# Patient Record
Sex: Female | Born: 1945 | Race: Black or African American | Hispanic: No | Marital: Single | Smoking: Never smoker
Health system: Southern US, Community
[De-identification: ages and names within clinical notes are randomized; demographics above are authoritative.]

## PROBLEM LIST (undated history)

## (undated) DIAGNOSIS — H409 Unspecified glaucoma: Secondary | ICD-10-CM

## (undated) DIAGNOSIS — H35039 Hypertensive retinopathy, unspecified eye: Secondary | ICD-10-CM

## (undated) DIAGNOSIS — H269 Unspecified cataract: Secondary | ICD-10-CM

## (undated) DIAGNOSIS — Z9289 Personal history of other medical treatment: Secondary | ICD-10-CM

## (undated) DIAGNOSIS — M1990 Unspecified osteoarthritis, unspecified site: Secondary | ICD-10-CM

## (undated) DIAGNOSIS — I10 Essential (primary) hypertension: Secondary | ICD-10-CM

## (undated) DIAGNOSIS — E785 Hyperlipidemia, unspecified: Secondary | ICD-10-CM

## (undated) DIAGNOSIS — E11319 Type 2 diabetes mellitus with unspecified diabetic retinopathy without macular edema: Secondary | ICD-10-CM

## (undated) HISTORY — DX: Unspecified glaucoma: H40.9

## (undated) HISTORY — PX: EYE SURGERY: SHX253

## (undated) HISTORY — PX: CARPAL TUNNEL RELEASE: SHX101

## (undated) HISTORY — DX: Unspecified cataract: H26.9

## (undated) HISTORY — DX: Type 2 diabetes mellitus with unspecified diabetic retinopathy without macular edema: E11.319

## (undated) HISTORY — PX: TUBAL LIGATION: SHX77

## (undated) HISTORY — PX: KNEE ARTHROSCOPY: SUR90

## (undated) HISTORY — DX: Hypertensive retinopathy, unspecified eye: H35.039

---

## 1997-07-21 ENCOUNTER — Ambulatory Visit (HOSPITAL_COMMUNITY): Admission: RE | Admit: 1997-07-21 | Discharge: 1997-07-21 | Payer: Self-pay | Admitting: Endocrinology

## 1997-12-12 ENCOUNTER — Encounter: Admission: RE | Admit: 1997-12-12 | Discharge: 1998-03-12 | Payer: Self-pay | Admitting: Endocrinology

## 2000-09-16 ENCOUNTER — Other Ambulatory Visit: Admission: RE | Admit: 2000-09-16 | Discharge: 2000-09-16 | Payer: Self-pay | Admitting: *Deleted

## 2001-01-26 ENCOUNTER — Encounter: Admission: RE | Admit: 2001-01-26 | Discharge: 2001-04-26 | Payer: Self-pay | Admitting: Internal Medicine

## 2001-01-29 ENCOUNTER — Encounter: Admission: RE | Admit: 2001-01-29 | Discharge: 2001-01-29 | Payer: Self-pay | Admitting: Internal Medicine

## 2001-01-29 ENCOUNTER — Encounter: Payer: Self-pay | Admitting: Internal Medicine

## 2002-02-05 ENCOUNTER — Encounter: Payer: Self-pay | Admitting: Internal Medicine

## 2002-02-05 ENCOUNTER — Encounter: Admission: RE | Admit: 2002-02-05 | Discharge: 2002-02-05 | Payer: Self-pay | Admitting: Internal Medicine

## 2002-02-10 ENCOUNTER — Encounter: Payer: Self-pay | Admitting: Internal Medicine

## 2002-02-10 ENCOUNTER — Encounter: Admission: RE | Admit: 2002-02-10 | Discharge: 2002-02-10 | Payer: Self-pay | Admitting: Internal Medicine

## 2002-04-01 ENCOUNTER — Encounter: Admission: RE | Admit: 2002-04-01 | Discharge: 2002-04-01 | Payer: Self-pay | Admitting: Rheumatology

## 2002-04-01 ENCOUNTER — Encounter: Payer: Self-pay | Admitting: Rheumatology

## 2004-07-22 ENCOUNTER — Emergency Department (HOSPITAL_COMMUNITY): Admission: EM | Admit: 2004-07-22 | Discharge: 2004-07-22 | Payer: Self-pay | Admitting: Emergency Medicine

## 2005-01-15 ENCOUNTER — Ambulatory Visit (HOSPITAL_BASED_OUTPATIENT_CLINIC_OR_DEPARTMENT_OTHER): Admission: RE | Admit: 2005-01-15 | Discharge: 2005-01-15 | Payer: Self-pay | Admitting: Orthopedic Surgery

## 2005-02-05 ENCOUNTER — Ambulatory Visit (HOSPITAL_BASED_OUTPATIENT_CLINIC_OR_DEPARTMENT_OTHER): Admission: RE | Admit: 2005-02-05 | Discharge: 2005-02-05 | Payer: Self-pay | Admitting: Orthopedic Surgery

## 2006-05-31 ENCOUNTER — Emergency Department (HOSPITAL_COMMUNITY): Admission: EM | Admit: 2006-05-31 | Discharge: 2006-05-31 | Payer: Self-pay | Admitting: Family Medicine

## 2006-06-03 ENCOUNTER — Emergency Department (HOSPITAL_COMMUNITY): Admission: EM | Admit: 2006-06-03 | Discharge: 2006-06-03 | Payer: Self-pay | Admitting: Family Medicine

## 2007-07-06 ENCOUNTER — Encounter: Admission: RE | Admit: 2007-07-06 | Discharge: 2007-07-06 | Payer: Self-pay | Admitting: Internal Medicine

## 2007-07-29 ENCOUNTER — Encounter (INDEPENDENT_AMBULATORY_CARE_PROVIDER_SITE_OTHER): Payer: Self-pay | Admitting: Interventional Radiology

## 2007-07-29 ENCOUNTER — Other Ambulatory Visit: Admission: RE | Admit: 2007-07-29 | Discharge: 2007-07-29 | Payer: Self-pay | Admitting: Interventional Radiology

## 2007-07-29 ENCOUNTER — Encounter: Admission: RE | Admit: 2007-07-29 | Discharge: 2007-07-29 | Payer: Self-pay | Admitting: Internal Medicine

## 2008-08-29 ENCOUNTER — Ambulatory Visit (HOSPITAL_BASED_OUTPATIENT_CLINIC_OR_DEPARTMENT_OTHER): Admission: RE | Admit: 2008-08-29 | Discharge: 2008-08-29 | Payer: Self-pay | Admitting: Specialist

## 2010-03-20 ENCOUNTER — Encounter: Payer: PRIVATE HEALTH INSURANCE | Attending: Physical Medicine & Rehabilitation

## 2010-03-20 ENCOUNTER — Ambulatory Visit: Payer: PRIVATE HEALTH INSURANCE | Admitting: Physical Medicine & Rehabilitation

## 2010-03-20 DIAGNOSIS — R209 Unspecified disturbances of skin sensation: Secondary | ICD-10-CM | POA: Insufficient documentation

## 2010-03-20 DIAGNOSIS — M79609 Pain in unspecified limb: Secondary | ICD-10-CM | POA: Insufficient documentation

## 2010-03-22 DIAGNOSIS — M79609 Pain in unspecified limb: Secondary | ICD-10-CM

## 2010-03-22 DIAGNOSIS — M25549 Pain in joints of unspecified hand: Secondary | ICD-10-CM

## 2010-03-22 DIAGNOSIS — R209 Unspecified disturbances of skin sensation: Secondary | ICD-10-CM

## 2010-04-21 LAB — POCT I-STAT 4, (NA,K, GLUC, HGB,HCT)
Glucose, Bld: 127 mg/dL — ABNORMAL HIGH (ref 70–99)
HCT: 41 % (ref 36.0–46.0)
Hemoglobin: 13.9 g/dL (ref 12.0–15.0)
Potassium: 4.3 mEq/L (ref 3.5–5.1)
Sodium: 139 mEq/L (ref 135–145)

## 2010-04-21 LAB — GLUCOSE, CAPILLARY: Glucose-Capillary: 99 mg/dL (ref 70–99)

## 2010-04-24 ENCOUNTER — Other Ambulatory Visit: Payer: Self-pay | Admitting: Physical Medicine & Rehabilitation

## 2010-04-24 ENCOUNTER — Ambulatory Visit (HOSPITAL_COMMUNITY)
Admission: RE | Admit: 2010-04-24 | Discharge: 2010-04-24 | Disposition: A | Discharge status: Home / Self Care | Payer: PRIVATE HEALTH INSURANCE | Source: Ambulatory Visit | Attending: Physical Medicine & Rehabilitation | Admitting: Physical Medicine & Rehabilitation

## 2010-04-24 DIAGNOSIS — M25549 Pain in joints of unspecified hand: Secondary | ICD-10-CM | POA: Insufficient documentation

## 2010-04-24 DIAGNOSIS — M19049 Primary osteoarthritis, unspecified hand: Secondary | ICD-10-CM | POA: Insufficient documentation

## 2010-04-24 DIAGNOSIS — R52 Pain, unspecified: Secondary | ICD-10-CM

## 2010-04-30 ENCOUNTER — Ambulatory Visit: Payer: PRIVATE HEALTH INSURANCE | Admitting: Physical Medicine & Rehabilitation

## 2010-04-30 ENCOUNTER — Encounter: Payer: PRIVATE HEALTH INSURANCE | Attending: Physical Medicine & Rehabilitation

## 2010-04-30 DIAGNOSIS — M79609 Pain in unspecified limb: Secondary | ICD-10-CM | POA: Insufficient documentation

## 2010-04-30 DIAGNOSIS — R209 Unspecified disturbances of skin sensation: Secondary | ICD-10-CM | POA: Insufficient documentation

## 2010-04-30 DIAGNOSIS — G5610 Other lesions of median nerve, unspecified upper limb: Secondary | ICD-10-CM

## 2010-05-28 ENCOUNTER — Ambulatory Visit: Payer: PRIVATE HEALTH INSURANCE | Admitting: Physical Medicine & Rehabilitation

## 2010-05-29 NOTE — Op Note (Signed)
NAMEKATHRIN, Mary [name]              ACCOUNT NO.:  [id number]   MEDICAL RECORD NO.:  [id number]          PATIENT TYPE:  AMB   LOCATION:  [facility]                         FACILITY:  [facility]   PHYSICIAN:  Jene Every, M.D.    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   PROCEDURE PERFORMED:  1. Right knee arthroscopy.  2. Partial medial meniscectomy.  3. Chondroplasty of the medial femoral condyle, patella and shaving of      the lateral meniscus.   ANESTHESIA:  General.   ASSISTANT:  Roma Schanz, P.A.   NOTE:  Technical difficulty elevated due to the patient morbid obesity,  requiring assistant as a leg holder.   TECHNIQUE:  With the patient in supine position after administration of  adequate general anesthesia and 2 grams of Kefzol, the right lower  extremity was prepped and draped in the usual sterile fashion.  A  lateral parapatellar portal and a superomedial parapatellar portal were  fashioned with a #11 blade, after localization with an 18 gauge needle,  sparing the medial meniscus.  Under direct visualization, the medial  parapatellar portal was fashioned with a #11 blade after localization  with an 18 gauge needle sparing the medial meniscus.  I had difficulty  due to the patient's morbid obesity and positioning and digital  palpation.  Noted was extensive grade III changes in the medial  compartment, a complex tear of the entire posterior and middle portion  of the medial meniscus tear.  The basket rongeur was introduced and  utilized to perform a partial medial meniscectomy to a stable base and  further contoured with a  35 Kuda shaver.  This was to a stable base and  a light chondroplasty was performed of the femoral condyle.   The ACL and PCL were unremarkable.  The lateral compartment was  difficult to enter however, it showed some grade III changes throughout,  some minor grade IV changes near the tibial spine.  Degenerative changes  of the lateral meniscus.  This was shaved and a chondroplasty was  performed of the tibial plateau.  Also, the femoral condyle.   Also, difficulty obtaining the patellofemoral joint.  Some grade III  changes.  Normal patellofemoral tracking.  Light chondroplasty performed  here.  The gutters were unremarkable.   I revisited the medial compartment.  No further pathology amenable to  surgical intervention.  The knee was copiously lavaged and all  instrumentation was removed.  The portals were closed with 4-0 nylon  simple sutures.  Marcaine 0.25% with epinephrine was infiltrated in the joint.  The wound  was dressed sterilely.  She was awoken without difficulty and  transported to the recovery room in satisfactory condition.   The patient tolerated the procedure and there were no complications.  Jene Every, M.D.  Electronically Signed     JB/MEDQ  D:  08/29/2008  T:  08/29/2008  Job:  [id number]

## 2010-06-01 ENCOUNTER — Encounter: Payer: PRIVATE HEALTH INSURANCE | Attending: Physical Medicine & Rehabilitation

## 2010-06-01 ENCOUNTER — Ambulatory Visit: Payer: PRIVATE HEALTH INSURANCE | Admitting: Physical Medicine & Rehabilitation

## 2010-06-01 DIAGNOSIS — R209 Unspecified disturbances of skin sensation: Secondary | ICD-10-CM | POA: Insufficient documentation

## 2010-06-01 DIAGNOSIS — M79609 Pain in unspecified limb: Secondary | ICD-10-CM | POA: Insufficient documentation

## 2010-06-01 DIAGNOSIS — G5600 Carpal tunnel syndrome, unspecified upper limb: Secondary | ICD-10-CM

## 2010-06-01 NOTE — Procedures (Signed)
Mary [name], MASELLI              ACCOUNT NO.:  [id number]  MEDICAL RECORD NO.:  [id number]           PATIENT TYPE:  O  LOCATION:  [facility]                          FACILITY:  [facility]  PHYSICIAN:  Erick Colace, M.D.DATE OF BIRTH:  March 03, 1945  DATE OF PROCEDURE:  06/01/2010 DATE OF DISCHARGE:                              OPERATIVE REPORT  PROCEDURE:  This is a left carpal tunnel injection under fluoroscopic guidance.  INDICATION:  Median nerve pain status post carpal tunnel release.  Pain is only partially response to medication management including narcotic analgesics and tramadol.  The patient has ADL effects from her pain.  Informed consent was obtained after describing risks and benefits of the procedure with the patient.  These include bleeding, bruising, and infection.  She elects to proceed and has given written consent.  The patient pre-scanned with ultrasound.  Cross-sectional diameter 0.9 cm2. Then a 27-gauge 5-8 inch needle inserted, 0.25 mL of 1% lidocaine injected.  Then 22-gauge echo block needle inserted, visualized directly under ultrasound guidance.  Image is saved, 0.25 mL of 40 mg/mL Depo- Medrol were injected.  The patient tolerated procedure well. Postprocedure instructions given.     Erick Colace, M.D. Electronically Signed    AEK/MEDQ  D:  06/01/2010 10:30:40  T:  06/01/2010 22:15:06  Job:  [id number]

## 2010-06-01 NOTE — Op Note (Signed)
Mary [name], Mary [name]              ACCOUNT NO.:  [id number]   MEDICAL RECORD NO.:  [id number]          PATIENT TYPE:  AMB   LOCATION:  [facility]                          FACILITY:  [facility]   PHYSICIAN:  Katy Fitch. Sypher, M.D. DATE OF BIRTH:  1945/09/28   DATE OF PROCEDURE:  01/15/2005  DATE OF DISCHARGE:  07/22/2004                                 OPERATIVE REPORT   PREOPERATIVE DIAGNOSIS:  Severe right carpal tunnel syndrome, with  background insulin-dependent diabetes.   POSTOPERATIVE DIAGNOSIS:  Severe right carpal tunnel syndrome, with  background insulin-dependent diabetes.   OPERATION:  Release of right transcarpal ligament.   OPERATING SURGEON:  Katy Fitch. Sypher, M.D.   ASSISTANT:  Marveen Reeks. Dasnoit, P.A.-C.   ANESTHESIA:  General by LMA.   SUPERVISING ANESTHESIOLOGIST:  Judie Petit, M.D.   INDICATIONS:  Mary [name] is a 65 year old woman referred through the  courtesy of Dr. Dorothyann Peng of [facility] for  evaluation and management of hand pain and numbness.   Mary [name] has a history of insulin-dependent diabetes and hypertension.   Dr. Allyne Gee noted thenar atrophy and clinical signs of carpal tunnel  syndrome. She was referred for evaluation and management including with  electrodiagnostic studies by Dr. Johna Roles.  Electrodiagnostic studies  confirmed severe right carpal tunnel syndrome.   Due to her failure to respond to nonoperative measures, she is brought to  the operating room at this time for release of her right transcarpal  ligament.   PROCEDURE:  Mary [name] is brought to the operating room and placed in a  supine position on the operating table. Following induction of general  anesthesia by LMA technique, the right arm was prepped with Betadine soap  and solution and  sterilely draped. Following exsanguination of the limb  with an Esmarch bandage, an arterial tourniquet on the proximal brachium and  was inflated to 240  mmHg. The procedure commenced with a short incision in  the line of the ring finger in the palm. The subcutaneous tissues were  carefully divided revealing the palmar fascia. This was split longitudinally  to reveal the common extensor branches of the median nerve.   These were followed back to transcarpal ligament which was carefully  isolated from the median nerve. The ligament was then released along its  ulnar border extending into the distal forearm.   This widely opened the carpal canal. No masses or other predicaments were  noted.   Bleeding points along the margin of the released ligament were  electrocauterized with bipolar current followed by repair of the skin with  intradermal 3-0 Prolene suture.   A compressive dressing was applied with a volar plaster splint maintaining  the wrist in 5 degrees of dorsiflexion.   For aftercare Mary [name] is provided a prescription for Percocet 5 mg 1  p.o. q.4-6 h. p.r.n. pain. She will return to see [place] in a week for dressing  change and advancement to an exercise program. Sutures will be removed in 8-  10 days postoperatively or sooner p.r.n. problems.      Katy Fitch Sypher, M.D.  Electronically Signed     RVS/MEDQ  D:  01/15/2005  T:  01/15/2005  Job:  [id number]   cc:   Dr. Teressa Senter x2 copies

## 2010-06-01 NOTE — Op Note (Signed)
Mary [name], Mary [name]              ACCOUNT NO.:  [id number]   MEDICAL RECORD NO.:  [id number]          PATIENT TYPE:  AMB   LOCATION:  [facility]                          FACILITY:  [facility]   PHYSICIAN:  Katy Fitch. Sypher, M.D. DATE OF BIRTH:  Sep 06, 1945   DATE OF PROCEDURE:  02/05/2005  DATE OF DISCHARGE:                                 OPERATIVE REPORT   PREOP DIAGNOSIS:  Severe left carpal tunnel syndrome with incidental finding  of left volar ganglion.   POSTOP DIAGNOSIS:  Severe left carpal tunnel syndrome with incidental  finding of left volar ganglion.   OPERATION:  Release of left transverse carpal ligament.   OPERATING SURGEON:  Katy Fitch. Sypher, M.D.   ASSISTANT:  Molly Maduro Dasnoit PA-C.   ANESTHESIA:  General by LMA.   SUPERVISING ANESTHESIOLOGIST:  Dr. Jairo Ben.   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 Dorothyann Peng [facility] for  evaluation and management of hand numbness and pain.   Dr. Allyne Gee noted bilateral thenar atrophy and significant hand impairment  consistent with severe carpal tunnel syndrome.   Mary [name] was referred for evaluation and management at [facility]. She was seen for consultation and noted to have evidence  of bilateral carpal tunnel syndrome with severe thenar atrophy. She had  electrodiagnostic studies completed by Dr. Wadie Lessen which revealed  absence of her motor conduction velocities bilaterally consistent with  profound carpal tunnel syndrome.   Mary [name] is status post release of right transverse carpal ligament 3  weeks prior and now presents for release of her left transverse carpal  ligament.   Preoperatively, during her examination she was noted to have a small  incidental volar ganglion adjacent to the radial artery at the wrist. This  has no bearing on her carpal tunnel syndrome. This was otherwise  asymptomatic  therefore, it is not addressed at this time.   After informed consent, she is brought to the operating this time  anticipating decompression of her left median nerve at the carpal tunnel by  release the transverse carpal ligament. Questions were invited and answered  preoperatively.   PROCEDURE:  Mary [name] is brought to the operating room and placed  supine position on the operating table.   Following induction of general anesthesia by LMA technique, the left arm was  prepped with Betadine soap solution, sterilely draped. Following examination  limb with Esmarch bandage, an arterial tourniquet on the proximal brachium  inflated to 220 mmHg. Procedure commenced with short incision in line of the  ring finger in the palm. The subcutaneous tissues were carefully divided to  reveal the palmar fascia.  This was split longitudinally to reveal the  common sensory branch of the median nerve. Ligament released along its ulnar  border extending into the distal forearm. This widely opened carpal canal.  No mass or other predicaments are noted.   Bleeding points along the margin of the released ligament were  electrocauterized with bipolar forceps.   The wound was inspected. There was noted be complete denervation  of the  thenar muscles with a veal appearance of pale atrophic muscle. There were no  other masses noted in the carpal canal.   There was then repaired with intradermal 3-0 Prolene suture.   A compressive dressing applied with a volar plaster splint maintaining wrist  in 5 degrees of dorsiflexion.      Katy Fitch Sypher, M.D.  Electronically Signed     RVS/MEDQ  D:  02/05/2005  T:  02/05/2005  Job:  [id number]

## 2010-07-06 ENCOUNTER — Ambulatory Visit: Payer: PRIVATE HEALTH INSURANCE | Admitting: Physical Medicine & Rehabilitation

## 2010-07-20 ENCOUNTER — Encounter: Payer: PRIVATE HEALTH INSURANCE | Attending: Physical Medicine & Rehabilitation

## 2010-07-20 ENCOUNTER — Ambulatory Visit (HOSPITAL_BASED_OUTPATIENT_CLINIC_OR_DEPARTMENT_OTHER): Payer: PRIVATE HEALTH INSURANCE | Admitting: Physical Medicine & Rehabilitation

## 2010-07-20 DIAGNOSIS — M79609 Pain in unspecified limb: Secondary | ICD-10-CM | POA: Insufficient documentation

## 2010-07-20 DIAGNOSIS — G5600 Carpal tunnel syndrome, unspecified upper limb: Secondary | ICD-10-CM

## 2010-07-20 DIAGNOSIS — R209 Unspecified disturbances of skin sensation: Secondary | ICD-10-CM | POA: Insufficient documentation

## 2010-07-20 DIAGNOSIS — M7710 Lateral epicondylitis, unspecified elbow: Secondary | ICD-10-CM

## 2010-07-20 NOTE — Assessment & Plan Note (Signed)
REASON FOR VISIT:  Elbow and wrist and hand pain.  A 65 year old female sent by Dr. Allyne Gee with primary complaint of hand pain.  She does have mild osteoarthritis.  She has had carpal tunnel release in the past, but despite this has had persistent pain in the median nerve distribution also going up to the elbows.  She had a repeat EMG and CV April 30, 2010, showing absent left median response at the sensory nerve.  Right median sensory was prolonged.  There was also prolongation of bilateral median motor responses.  She underwent a carpal tunnel injection on the left side on May 29, 2010, which was not helpful for her.  She was scheduled for the right side, but would not like to pursue this due to poor efficacy.  Her pain medicines include hydrocodone 5/325 b.i.d.  She thinks this works better.  She takes 2 a time, but I reiterated that she is prescribed 1 tablet twice a day mainly during work hours.  Other meds include Micardis, metoprolol, Crestor and Levemir.  REVIEW OF SYSTEMS:  Positive for elbow pain bilaterally left greater than right.  Examination, she does have tenderness over the lateral epicondyle left side greater than right side.  No pain with resisted wrist extension. Negative Tinel, negative Phalen.  She has intact sensation.  She has good strength in upper extremities.  Neck no pain.  Gait is normal.  IMPRESSION: 1. Bilateral carpal tunnel syndrome, chronic postoperative pain.  She     has evidence of median neuropathy.  It is difficult to say how much     this is new versus old.  Certainly, she never had a very good     relief from her release and may have had symptoms that went on for     a long time prior to her release.  Nevertheless, she would not like     to follow up with her prior surgeon on this. 2. Elbow pain.  This could be radiating further from the carpal tunnel     or also can be due to lateral epicondylitis given that she has some     point  tenderness on the left side greater than the right side.  We     will inject today.  PLAN: 1. We will in-state some gabapentin 100 mg working up to q.i.d. 2. Continue hydrocodone 5/325 b.i.d. emphasized need to stay within     prescribed limits.  However, may need to up it to 7.5 on next     prescription. 3. Discussed with the patient and agrees with plan.  I will see her     back in 3-4 weeks.     Erick Colace, M.D. Electronically Signed    AEK/MedQ D:  07/20/2010 11:44:42  T:  07/20/2010 13:18:30  Job #:  [id number]  cc:   Candyce Churn. Allyne Gee, M.D. Fax: [phone]

## 2010-07-20 NOTE — Procedures (Signed)
Mary [name], Mary [name]              ACCOUNT NO.:  [id number]  MEDICAL RECORD NO.:  [id number]           PATIENT TYPE:  O  LOCATION:  [facility]                          FACILITY:  [facility]  PHYSICIAN:  Erick Colace, M.D.DATE OF BIRTH:  1945-02-20  DATE OF PROCEDURE:  07/20/2010 DATE OF DISCHARGE:                              OPERATIVE REPORT  PROCEDURE:  Left lateral epicondyle injection.  INDICATIONS:  Left lateral epicondylitis.  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  These include bleeding, bruising, and infection.  She elects to proceed and has given written consent.  The patient placed in a seated position area over the left lateral epicondyle just 2 cm distal to the bone marked, prepped with Betadine and alcohol, entered with 25-gauge inch half needle, solution containing 0.5 mL of 40 mg/mL Depo-Medrol, 1 mL of 1% lidocaine injected.  The patient tolerated the procedure well.  Postprocedure instructions given. I will see her back in 1 month, possible injection on the right side at that time.  If not particularly helpful and still symptomatic, consider ultrasound-guided.     Erick Colace, M.D. Electronically Signed    AEK/MEDQ  D:  07/20/2010 11:40:26  T:  07/20/2010 15:34:02  Job:  [id number]

## 2010-08-27 ENCOUNTER — Encounter: Payer: PRIVATE HEALTH INSURANCE | Attending: Physical Medicine & Rehabilitation

## 2010-08-27 ENCOUNTER — Ambulatory Visit: Payer: PRIVATE HEALTH INSURANCE | Admitting: Physical Medicine & Rehabilitation

## 2010-08-27 DIAGNOSIS — R209 Unspecified disturbances of skin sensation: Secondary | ICD-10-CM | POA: Insufficient documentation

## 2010-08-27 DIAGNOSIS — M79609 Pain in unspecified limb: Secondary | ICD-10-CM | POA: Insufficient documentation

## 2010-09-07 ENCOUNTER — Ambulatory Visit: Payer: PRIVATE HEALTH INSURANCE | Admitting: Physical Medicine & Rehabilitation

## 2010-09-07 DIAGNOSIS — M25539 Pain in unspecified wrist: Secondary | ICD-10-CM

## 2010-09-07 DIAGNOSIS — M25549 Pain in joints of unspecified hand: Secondary | ICD-10-CM

## 2010-09-08 NOTE — Assessment & Plan Note (Signed)
HISTORY OF PRESENT ILLNESS:  This is a patient seen by Dr. Wynn Banker for complaints of bilateral hand pain.  She has been treated by Dr. Allyne Gee for the same.  She has arthritis and history of carpal tunnel release in the past.  She was seen by Dr. Wynn Banker in July.  He at that time continued her on hydrocodone 5/325 b.i.d.  Today, she did not bring her bottles with her.  She states she just got her refill on Monday but she has taken at least three a day because of her pain.  She rates her pain at 8.  She does not indicate what her activity level is when the pain is worse, what helps her, or what makes it worse.  Her mobility is independent today.  She does use a cane.  She does not climb steps.  She drives.  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  REVIEW OF SYSTEMS:  Notable for those difficulties, otherwise within normal limits.  PAST MEDICAL HISTORY:  Unchanged.  SOCIAL HISTORY:  Not indicated.  FAMILY HISTORY:  Significant for diabetes and hypertension.  PHYSICAL EXAMINATION:  VITAL SIGNS:  Blood pressure 149/79, pulse 93, respirations 18, and O2 sats 99 on room air. NEUROLOGIC:  Her motor strength and sensation is intact in the upper and lower extremities.  Constitutionally, she is morbidly obese.  She is alert and oriented x3 and appears somewhat irritable.  She does walk with a limp.  ASSESSMENT: 1. Bilateral carpal tunnel syndrome, chronic postoperative pain. 2. Elbow pain.  PLAN:  She can continue her gabapentin 100 mg up to q.i.d.  I spoke with Dr. Wynn Banker because of the fact he counts of her last month's hydrocodone use and the fact she did not bring her bottles we will not prescribe any more hydrocodone.  She understands this.  We will see her back here in the clinic with Dr. Wynn Banker in 1 month.     Sherril Shipman L. Blima Dessert Electronically Signed    RLW/MedQ D:  09/07/2010 15:22:45  T:  09/08/2010 00:33:42  Job #:  [id number]

## 2010-10-05 ENCOUNTER — Ambulatory Visit: Payer: PRIVATE HEALTH INSURANCE | Admitting: Physical Medicine & Rehabilitation

## 2011-10-08 ENCOUNTER — Other Ambulatory Visit: Payer: Self-pay | Admitting: Orthopedic Surgery

## 2011-10-11 ENCOUNTER — Encounter (HOSPITAL_COMMUNITY)
Admission: RE | Admit: 2011-10-11 | Discharge: 2011-10-11 | Disposition: A | Discharge status: Home / Self Care | Payer: PRIVATE HEALTH INSURANCE | Source: Ambulatory Visit | Attending: Orthopedic Surgery | Admitting: Orthopedic Surgery

## 2011-10-11 ENCOUNTER — Encounter (HOSPITAL_COMMUNITY): Payer: Self-pay

## 2011-10-11 HISTORY — DX: Essential (primary) hypertension: I10

## 2011-10-11 HISTORY — DX: Hyperlipidemia, unspecified: E78.5

## 2011-10-11 HISTORY — DX: Unspecified osteoarthritis, unspecified site: M19.90

## 2011-10-11 LAB — COMPREHENSIVE METABOLIC PANEL
AST: 15 U/L (ref 0–37)
Albumin: 4 g/dL (ref 3.5–5.2)
Alkaline Phosphatase: 90 U/L (ref 39–117)
CO2: 26 mEq/L (ref 19–32)
Chloride: 102 mEq/L (ref 96–112)
Creatinine, Ser: 1.05 mg/dL (ref 0.50–1.10)
GFR calc non Af Amer: 55 mL/min — ABNORMAL LOW (ref >90)
Potassium: 4.3 mEq/L (ref 3.5–5.1)
Total Bilirubin: 0.4 mg/dL (ref 0.3–1.2)

## 2011-10-11 LAB — URINALYSIS, ROUTINE W REFLEX MICROSCOPIC
Glucose, UA: NEGATIVE mg/dL
Hgb urine dipstick: NEGATIVE
Ketones, ur: NEGATIVE mg/dL
Specific Gravity, Urine: 1.024 (ref 1.005–1.030)
pH: 6 (ref 5.0–8.0)

## 2011-10-11 LAB — CBC WITH DIFFERENTIAL/PLATELET
Basophils Absolute: 0.1 10*3/uL (ref 0.0–0.1)
HCT: 37 % (ref 36.0–46.0)
Hemoglobin: 12.9 g/dL (ref 12.0–15.0)
Lymphocytes Relative: 30 % (ref 12–46)
Monocytes Absolute: 0.6 10*3/uL (ref 0.1–1.0)
Monocytes Relative: 6 % (ref 3–12)
Neutro Abs: 6.6 10*3/uL (ref 1.7–7.7)
Neutrophils Relative %: 61 % (ref 43–77)
RDW: 12.8 % (ref 11.5–15.5)
WBC: 10.7 10*3/uL — ABNORMAL HIGH (ref 4.0–10.5)

## 2011-10-11 LAB — PROTIME-INR: INR: 0.97 (ref 0.00–1.49)

## 2011-10-11 LAB — TYPE AND SCREEN: ABO/RH(D): O POS

## 2011-10-11 LAB — SURGICAL PCR SCREEN: Staphylococcus aureus: NEGATIVE

## 2011-10-11 LAB — APTT: aPTT: 32 seconds (ref 24–37)

## 2011-10-11 MED ORDER — CHLORHEXIDINE GLUCONATE 4 % EX LIQD: 60.0000 mL | Freq: Once | CUTANEOUS | Status: DC

## 2011-10-11 NOTE — Pre-Procedure Instructions (Signed)
20 Mary [name]  10/11/2011   Your procedure is scheduled on:  October 21, 2011  Report to [facility] at 5:30 AM.  Call this number if you have problems the morning of surgery: [phone]   Remember:   Do not eat food:After Midnight.    Take these medicines the morning of surgery with A SIP OF WATER: micardis, nexium    STOP ASPIRIN   Do not wear jewelry, make-up or nail polish.  Do not wear lotions, powders, or perfumes.   Do not shave 48 hours prior to surgery.   Do not bring valuables to the hospital.  Contacts, dentures or bridgework may not be worn into surgery.  Leave suitcase in the car. After surgery it may be brought to your room.  For patients admitted to the hospital, checkout time is 11:00 AM the day of discharge.   Patients discharged the day of surgery will not be allowed to drive home.  Name and phone number of your driver:   Special Instructions: Incentive Spirometry - Practice and bring it with you on the day of surgery. Shower using CHG 2 nights before surgery and the night before surgery.  If you shower the day of surgery use CHG.  Use special wash - you have one bottle of CHG for all showers.  You should use approximately 1/3 of the bottle for each shower.   Please read over the following fact sheets that you were given: Pain Booklet, Coughing and Deep Breathing, Blood Transfusion Information, Total Joint Packet and Surgical Site Infection Prevention

## 2011-10-12 LAB — URINE CULTURE: Colony Count: 40000

## 2011-10-20 MED ORDER — DEXTROSE 5 % IV SOLN
3.0000 g | INTRAVENOUS | Status: DC
  Filled 2011-10-20: qty 3000

## 2011-10-21 ENCOUNTER — Encounter (HOSPITAL_COMMUNITY): Payer: Self-pay | Admitting: Certified Registered"

## 2011-10-21 ENCOUNTER — Encounter (HOSPITAL_COMMUNITY)
Admission: RE | Disposition: A | Discharge status: Home / Self Care | Payer: Self-pay | Source: Ambulatory Visit | Attending: Orthopedic Surgery

## 2011-10-21 ENCOUNTER — Inpatient Hospital Stay (HOSPITAL_COMMUNITY)
Admission: RE | Admit: 2011-10-21 | Discharge: 2011-10-25 | DRG: 470 | Disposition: A | Discharge status: Home / Self Care | Payer: PRIVATE HEALTH INSURANCE | Source: Ambulatory Visit | Attending: Orthopedic Surgery | Admitting: Orthopedic Surgery

## 2011-10-21 ENCOUNTER — Encounter (HOSPITAL_COMMUNITY): Payer: Self-pay | Admitting: Surgery

## 2011-10-21 ENCOUNTER — Inpatient Hospital Stay (HOSPITAL_COMMUNITY): Payer: PRIVATE HEALTH INSURANCE | Admitting: Certified Registered"

## 2011-10-21 ENCOUNTER — Encounter (HOSPITAL_COMMUNITY): Payer: Self-pay | Admitting: *Deleted

## 2011-10-21 DIAGNOSIS — E785 Hyperlipidemia, unspecified: Secondary | ICD-10-CM | POA: Diagnosis present

## 2011-10-21 DIAGNOSIS — Z794 Long term (current) use of insulin: Secondary | ICD-10-CM

## 2011-10-21 DIAGNOSIS — Z01810 Encounter for preprocedural cardiovascular examination: Secondary | ICD-10-CM

## 2011-10-21 DIAGNOSIS — M1710 Unilateral primary osteoarthritis, unspecified knee: Principal | ICD-10-CM | POA: Diagnosis present

## 2011-10-21 DIAGNOSIS — Z7982 Long term (current) use of aspirin: Secondary | ICD-10-CM

## 2011-10-21 DIAGNOSIS — E119 Type 2 diabetes mellitus without complications: Secondary | ICD-10-CM | POA: Diagnosis present

## 2011-10-21 DIAGNOSIS — I10 Essential (primary) hypertension: Secondary | ICD-10-CM | POA: Diagnosis present

## 2011-10-21 DIAGNOSIS — F17200 Nicotine dependence, unspecified, uncomplicated: Secondary | ICD-10-CM | POA: Diagnosis present

## 2011-10-21 DIAGNOSIS — Z01818 Encounter for other preprocedural examination: Secondary | ICD-10-CM

## 2011-10-21 DIAGNOSIS — Z01812 Encounter for preprocedural laboratory examination: Secondary | ICD-10-CM

## 2011-10-21 DIAGNOSIS — D62 Acute posthemorrhagic anemia: Secondary | ICD-10-CM | POA: Diagnosis not present

## 2011-10-21 DIAGNOSIS — IMO0002 Reserved for concepts with insufficient information to code with codable children: Principal | ICD-10-CM | POA: Diagnosis present

## 2011-10-21 DIAGNOSIS — M1711 Unilateral primary osteoarthritis, right knee: Secondary | ICD-10-CM

## 2011-10-21 DIAGNOSIS — Z79899 Other long term (current) drug therapy: Secondary | ICD-10-CM

## 2011-10-21 DIAGNOSIS — Z01811 Encounter for preprocedural respiratory examination: Secondary | ICD-10-CM

## 2011-10-21 HISTORY — PX: TOTAL KNEE ARTHROPLASTY: SHX125

## 2011-10-21 LAB — GLUCOSE, CAPILLARY
Glucose-Capillary: 92 mg/dL (ref 70–99)
Glucose-Capillary: 148 mg/dL — ABNORMAL HIGH (ref 70–99)

## 2011-10-21 LAB — HEMOGLOBIN A1C: Mean Plasma Glucose: 114 mg/dL (ref <117)

## 2011-10-21 SURGERY — ARTHROPLASTY, KNEE, TOTAL
Anesthesia: Regional | Site: Knee | Laterality: Right | Wound class: Clean

## 2011-10-21 MED ORDER — ONDANSETRON HCL 4 MG PO TABS: 4.0000 mg | ORAL_TABLET | Freq: Four times a day (QID) | ORAL | Status: DC | PRN

## 2011-10-21 MED ORDER — INFLUENZA VIRUS VACC SPLIT PF IM SUSP
0.5000 mL | INTRAMUSCULAR | Status: AC
  Administered 2011-10-22: 0.5 mL via INTRAMUSCULAR
  Filled 2011-10-21: qty 0.5

## 2011-10-21 MED ORDER — BUPIVACAINE-EPINEPHRINE 0.25% -1:200000 IJ SOLN
INTRAMUSCULAR | Status: DC | PRN
  Administered 2011-10-21: 20 mL

## 2011-10-21 MED ORDER — HYDROCHLOROTHIAZIDE 25 MG PO TABS
25.0000 mg | ORAL_TABLET | Freq: Every day | ORAL | Status: DC
  Administered 2011-10-22 – 2011-10-25 (×4): 25 mg via ORAL
  Filled 2011-10-21 (×4): qty 1

## 2011-10-21 MED ORDER — BUPIVACAINE-EPINEPHRINE PF 0.5-1:200000 % IJ SOLN
INTRAMUSCULAR | Status: DC | PRN
  Administered 2011-10-21: 30 mL

## 2011-10-21 MED ORDER — BUPIVACAINE 0.25 % ON-Q PUMP SINGLE CATH 300ML
300.0000 mL | INJECTION | Status: DC
  Filled 2011-10-21: qty 300

## 2011-10-21 MED ORDER — ALUM & MAG HYDROXIDE-SIMETH 200-200-20 MG/5ML PO SUSP: 30.0000 mL | ORAL | Status: DC | PRN

## 2011-10-21 MED ORDER — CELECOXIB 200 MG PO CAPS
200.0000 mg | ORAL_CAPSULE | Freq: Two times a day (BID) | ORAL | Status: DC
  Administered 2011-10-21 – 2011-10-25 (×9): 200 mg via ORAL
  Filled 2011-10-21 (×10): qty 1

## 2011-10-21 MED ORDER — TELMISARTAN-HCTZ 80-25 MG PO TABS: 1.0000 | ORAL_TABLET | Freq: Every day | ORAL | Status: DC

## 2011-10-21 MED ORDER — HYDROMORPHONE HCL PF 1 MG/ML IJ SOLN
0.2500 mg | INTRAMUSCULAR | Status: DC | PRN
  Administered 2011-10-21 (×6): 0.5 mg via INTRAVENOUS

## 2011-10-21 MED ORDER — SODIUM CHLORIDE 0.9 % IV SOLN: INTRAVENOUS | Status: DC

## 2011-10-21 MED ORDER — SODIUM CHLORIDE 0.9 % IR SOLN
Status: DC | PRN
  Administered 2011-10-21: 3000 mL

## 2011-10-21 MED ORDER — SENNOSIDES-DOCUSATE SODIUM 8.6-50 MG PO TABS: 1.0000 | ORAL_TABLET | Freq: Every evening | ORAL | Status: DC | PRN

## 2011-10-21 MED ORDER — BUPIVACAINE-EPINEPHRINE PF 0.25-1:200000 % IJ SOLN
INTRAMUSCULAR | Status: AC
  Filled 2011-10-21: qty 30

## 2011-10-21 MED ORDER — OXYCODONE HCL 5 MG/5ML PO SOLN: 5.0000 mg | Freq: Once | ORAL | Status: DC | PRN

## 2011-10-21 MED ORDER — LIRAGLUTIDE 18 MG/3ML ~~LOC~~ SOLN
1.8000 mg | Freq: Every day | SUBCUTANEOUS | Status: DC
  Administered 2011-10-21 – 2011-10-24 (×3): 1.8 mg via SUBCUTANEOUS

## 2011-10-21 MED ORDER — ONDANSETRON HCL 4 MG/2ML IJ SOLN: 4.0000 mg | Freq: Once | INTRAMUSCULAR | Status: DC | PRN

## 2011-10-21 MED ORDER — ONDANSETRON HCL 4 MG/2ML IJ SOLN
INTRAMUSCULAR | Status: DC | PRN
  Administered 2011-10-21: 4 mg via INTRAVENOUS

## 2011-10-21 MED ORDER — BUPIVACAINE 0.25 % ON-Q PUMP SINGLE CATH 300ML
INJECTION | Status: DC | PRN
  Administered 2011-10-21: 300 mL

## 2011-10-21 MED ORDER — INSULIN DETEMIR 100 UNIT/ML ~~LOC~~ SOLN
38.0000 [IU] | Freq: Every day | SUBCUTANEOUS | Status: DC
  Administered 2011-10-22 – 2011-10-24 (×3): 38 [IU] via SUBCUTANEOUS
  Filled 2011-10-21: qty 10

## 2011-10-21 MED ORDER — ROCURONIUM BROMIDE 100 MG/10ML IV SOLN
INTRAVENOUS | Status: DC | PRN
Start: 2011-10-21 — End: 2011-10-21
  Administered 2011-10-21: 50 mg via INTRAVENOUS

## 2011-10-21 MED ORDER — DIPHENHYDRAMINE HCL 12.5 MG/5ML PO ELIX: 12.5000 mg | ORAL_SOLUTION | ORAL | Status: DC | PRN

## 2011-10-21 MED ORDER — DOCUSATE SODIUM 100 MG PO CAPS
100.0000 mg | ORAL_CAPSULE | Freq: Two times a day (BID) | ORAL | Status: DC
  Administered 2011-10-21 – 2011-10-25 (×8): 100 mg via ORAL
  Filled 2011-10-21 (×10): qty 1

## 2011-10-21 MED ORDER — HYDROMORPHONE HCL PF 1 MG/ML IJ SOLN
1.0000 mg | INTRAMUSCULAR | Status: DC | PRN
  Administered 2011-10-21 – 2011-10-24 (×9): 1 mg via INTRAVENOUS
  Filled 2011-10-21 (×10): qty 1

## 2011-10-21 MED ORDER — OXYCODONE HCL 5 MG PO TABS
5.0000 mg | ORAL_TABLET | ORAL | Status: DC | PRN
  Administered 2011-10-21 – 2011-10-25 (×15): 10 mg via ORAL
  Filled 2011-10-21 (×17): qty 2

## 2011-10-21 MED ORDER — CEFAZOLIN SODIUM-DEXTROSE 2-3 GM-% IV SOLR
2.0000 g | Freq: Four times a day (QID) | INTRAVENOUS | Status: AC
  Administered 2011-10-21 (×2): 2 g via INTRAVENOUS
  Filled 2011-10-21 (×2): qty 50

## 2011-10-21 MED ORDER — FLEET ENEMA 7-19 GM/118ML RE ENEM: 1.0000 | ENEMA | Freq: Once | RECTAL | Status: AC | PRN

## 2011-10-21 MED ORDER — PHENOL 1.4 % MT LIQD: 1.0000 | OROMUCOSAL | Status: DC | PRN

## 2011-10-21 MED ORDER — ZOLPIDEM TARTRATE 5 MG PO TABS: 5.0000 mg | ORAL_TABLET | Freq: Every evening | ORAL | Status: DC | PRN

## 2011-10-21 MED ORDER — INSULIN ASPART 100 UNIT/ML ~~LOC~~ SOLN
0.0000 [IU] | Freq: Three times a day (TID) | SUBCUTANEOUS | Status: DC
  Administered 2011-10-22 – 2011-10-24 (×3): 2 [IU] via SUBCUTANEOUS

## 2011-10-21 MED ORDER — CEFAZOLIN SODIUM 1-5 GM-% IV SOLN
INTRAVENOUS | Status: DC | PRN
  Administered 2011-10-21: 3 g via INTRAVENOUS

## 2011-10-21 MED ORDER — METHOCARBAMOL 100 MG/ML IJ SOLN
500.0000 mg | Freq: Four times a day (QID) | INTRAVENOUS | Status: DC | PRN
  Filled 2011-10-21: qty 5

## 2011-10-21 MED ORDER — ACETAMINOPHEN 10 MG/ML IV SOLN: 1000.0000 mg | Freq: Four times a day (QID) | INTRAVENOUS | Status: DC

## 2011-10-21 MED ORDER — HYDROMORPHONE HCL PF 1 MG/ML IJ SOLN
INTRAMUSCULAR | Status: AC
  Filled 2011-10-21: qty 1

## 2011-10-21 MED ORDER — MENTHOL 3 MG MT LOZG: 1.0000 | LOZENGE | OROMUCOSAL | Status: DC | PRN

## 2011-10-21 MED ORDER — LIDOCAINE HCL (CARDIAC) 20 MG/ML IV SOLN
INTRAVENOUS | Status: DC | PRN
  Administered 2011-10-21: 100 mg via INTRAVENOUS

## 2011-10-21 MED ORDER — SODIUM CHLORIDE 0.9 % IR SOLN
Status: DC | PRN
  Administered 2011-10-21: 1000 mL

## 2011-10-21 MED ORDER — ACETAMINOPHEN 10 MG/ML IV SOLN
1000.0000 mg | Freq: Four times a day (QID) | INTRAVENOUS | Status: AC
  Administered 2011-10-21 – 2011-10-22 (×4): 1000 mg via INTRAVENOUS
  Filled 2011-10-21 (×4): qty 100

## 2011-10-21 MED ORDER — SODIUM CHLORIDE 0.9 % IV SOLN
INTRAVENOUS | Status: DC
  Administered 2011-10-21 – 2011-10-23 (×4): via INTRAVENOUS

## 2011-10-21 MED ORDER — METOCLOPRAMIDE HCL 10 MG PO TABS: 5.0000 mg | ORAL_TABLET | Freq: Three times a day (TID) | ORAL | Status: DC | PRN

## 2011-10-21 MED ORDER — ACETAMINOPHEN 650 MG RE SUPP: 650.0000 mg | Freq: Four times a day (QID) | RECTAL | Status: DC | PRN

## 2011-10-21 MED ORDER — METHOCARBAMOL 500 MG PO TABS
500.0000 mg | ORAL_TABLET | Freq: Four times a day (QID) | ORAL | Status: DC | PRN
  Administered 2011-10-22 – 2011-10-25 (×7): 500 mg via ORAL
  Filled 2011-10-21 (×7): qty 1

## 2011-10-21 MED ORDER — ONDANSETRON HCL 4 MG/2ML IJ SOLN: 4.0000 mg | Freq: Four times a day (QID) | INTRAMUSCULAR | Status: DC | PRN

## 2011-10-21 MED ORDER — FENTANYL CITRATE 0.05 MG/ML IJ SOLN
INTRAMUSCULAR | Status: DC | PRN
  Administered 2011-10-21 (×2): 50 ug via INTRAVENOUS
  Administered 2011-10-21: 100 ug via INTRAVENOUS
  Administered 2011-10-21: 50 ug via INTRAVENOUS

## 2011-10-21 MED ORDER — MIDAZOLAM HCL 5 MG/5ML IJ SOLN
INTRAMUSCULAR | Status: DC | PRN
  Administered 2011-10-21: 2 mg via INTRAVENOUS

## 2011-10-21 MED ORDER — GLYCOPYRROLATE 0.2 MG/ML IJ SOLN
INTRAMUSCULAR | Status: DC | PRN
  Administered 2011-10-21: .6 mg via INTRAVENOUS

## 2011-10-21 MED ORDER — LACTATED RINGERS IV SOLN
INTRAVENOUS | Status: DC | PRN
  Administered 2011-10-21 (×2): via INTRAVENOUS

## 2011-10-21 MED ORDER — ACETAMINOPHEN 325 MG PO TABS: 650.0000 mg | ORAL_TABLET | Freq: Four times a day (QID) | ORAL | Status: DC | PRN

## 2011-10-21 MED ORDER — PROPOFOL 10 MG/ML IV BOLUS
INTRAVENOUS | Status: DC | PRN
  Administered 2011-10-21: 10 mg via INTRAVENOUS
  Administered 2011-10-21: 120 mg via INTRAVENOUS
  Administered 2011-10-21: 10 mg via INTRAVENOUS

## 2011-10-21 MED ORDER — ENOXAPARIN SODIUM 30 MG/0.3ML ~~LOC~~ SOLN
30.0000 mg | Freq: Two times a day (BID) | SUBCUTANEOUS | Status: DC
  Administered 2011-10-22 – 2011-10-25 (×7): 30 mg via SUBCUTANEOUS
  Filled 2011-10-21 (×9): qty 0.3

## 2011-10-21 MED ORDER — NEOSTIGMINE METHYLSULFATE 1 MG/ML IJ SOLN
INTRAMUSCULAR | Status: DC | PRN
  Administered 2011-10-21: 4 mg via INTRAVENOUS

## 2011-10-21 MED ORDER — HYDROMORPHONE HCL PF 1 MG/ML IJ SOLN
INTRAMUSCULAR | Status: AC
  Filled 2011-10-21: qty 2

## 2011-10-21 MED ORDER — OXYCODONE HCL 5 MG PO TABS: 5.0000 mg | ORAL_TABLET | Freq: Once | ORAL | Status: DC | PRN

## 2011-10-21 MED ORDER — BISACODYL 5 MG PO TBEC: 5.0000 mg | DELAYED_RELEASE_TABLET | Freq: Every day | ORAL | Status: DC | PRN

## 2011-10-21 MED ORDER — BUPIVACAINE ON-Q PAIN PUMP (FOR ORDER SET NO CHG)
INJECTION | Status: AC
  Filled 2011-10-21: qty 1

## 2011-10-21 MED ORDER — MEPERIDINE HCL 25 MG/ML IJ SOLN: 6.2500 mg | INTRAMUSCULAR | Status: DC | PRN

## 2011-10-21 MED ORDER — METOCLOPRAMIDE HCL 5 MG/ML IJ SOLN: 5.0000 mg | Freq: Three times a day (TID) | INTRAMUSCULAR | Status: DC | PRN

## 2011-10-21 MED ORDER — IRBESARTAN 300 MG PO TABS
300.0000 mg | ORAL_TABLET | Freq: Every day | ORAL | Status: DC
  Administered 2011-10-22 – 2011-10-25 (×4): 300 mg via ORAL
  Filled 2011-10-21 (×4): qty 1

## 2011-10-21 MED ORDER — OXYCODONE HCL 10 MG PO TB12
10.0000 mg | ORAL_TABLET | Freq: Two times a day (BID) | ORAL | Status: DC
  Administered 2011-10-21 – 2011-10-25 (×9): 10 mg via ORAL
  Filled 2011-10-21 (×9): qty 1

## 2011-10-21 MED ORDER — INSULIN ASPART 100 UNIT/ML ~~LOC~~ SOLN: 0.0000 [IU] | Freq: Every day | SUBCUTANEOUS | Status: DC

## 2011-10-21 MED ORDER — ATORVASTATIN CALCIUM 20 MG PO TABS
20.0000 mg | ORAL_TABLET | Freq: Every day | ORAL | Status: DC
  Administered 2011-10-21 – 2011-10-24 (×4): 20 mg via ORAL
  Filled 2011-10-21 (×5): qty 1

## 2011-10-21 SURGICAL SUPPLY — 57 items
BANDAGE ESMARK 6X9 LF (GAUZE/BANDAGES/DRESSINGS) ×1 IMPLANT
BLADE SAGITTAL 13X1.27X60 (BLADE) ×2 IMPLANT
BLADE SAW SGTL 83.5X18.5 (BLADE) ×2 IMPLANT
BNDG ESMARK 6X9 LF (GAUZE/BANDAGES/DRESSINGS) ×2
BOWL SMART MIX CTS (DISPOSABLE) ×2 IMPLANT
CATH KIT ON Q 5IN SLV (PAIN MANAGEMENT) ×2 IMPLANT
CEMENT BONE SIMPLEX SPEEDSET (Cement) ×2 IMPLANT
CLOTH BEACON ORANGE TIMEOUT ST (SAFETY) ×2 IMPLANT
COVER BACK TABLE 24X17X13 BIG (DRAPES) IMPLANT
COVER SURGICAL LIGHT HANDLE (MISCELLANEOUS) ×2 IMPLANT
CUFF TOURNIQUET SINGLE 34IN LL (TOURNIQUET CUFF) ×2 IMPLANT
DRAPE EXTREMITY T 121X128X90 (DRAPE) ×2 IMPLANT
DRAPE INCISE IOBAN 66X45 STRL (DRAPES) ×4 IMPLANT
DRAPE PROXIMA HALF (DRAPES) ×2 IMPLANT
DRAPE U-SHAPE 47X51 STRL (DRAPES) ×2 IMPLANT
DRSG ADAPTIC 3X8 NADH LF (GAUZE/BANDAGES/DRESSINGS) ×2 IMPLANT
DRSG PAD ABDOMINAL 8X10 ST (GAUZE/BANDAGES/DRESSINGS) ×2 IMPLANT
DURAPREP 26ML APPLICATOR (WOUND CARE) ×4 IMPLANT
ELECT REM PT RETURN 9FT ADLT (ELECTROSURGICAL) ×2
ELECTRODE REM PT RTRN 9FT ADLT (ELECTROSURGICAL) ×1 IMPLANT
EVACUATOR 1/8 PVC DRAIN (DRAIN) ×2 IMPLANT
GLOVE BIOGEL M 7.0 STRL (GLOVE) IMPLANT
GLOVE BIOGEL PI IND STRL 7.5 (GLOVE) IMPLANT
GLOVE BIOGEL PI IND STRL 8.5 (GLOVE) ×1 IMPLANT
GLOVE BIOGEL PI INDICATOR 7.5 (GLOVE)
GLOVE BIOGEL PI INDICATOR 8.5 (GLOVE) ×1
GLOVE BIOGEL PI ORTHO PRO SZ8 (GLOVE) ×1
GLOVE PI ORTHO PRO STRL SZ8 (GLOVE) ×1 IMPLANT
GLOVE SURG ORTHO 8.0 STRL STRW (GLOVE) ×4 IMPLANT
GOWN PREVENTION PLUS XLARGE (GOWN DISPOSABLE) ×4 IMPLANT
GOWN STRL NON-REIN LRG LVL3 (GOWN DISPOSABLE) ×4 IMPLANT
HANDPIECE INTERPULSE COAX TIP (DISPOSABLE) ×1
HOOD PEEL AWAY FACE SHEILD DIS (HOOD) ×8 IMPLANT
KIT BASIN OR (CUSTOM PROCEDURE TRAY) ×2 IMPLANT
KIT ROOM TURNOVER OR (KITS) ×2 IMPLANT
MANIFOLD NEPTUNE II (INSTRUMENTS) ×2 IMPLANT
NEEDLE 22X1 1/2 (OR ONLY) (NEEDLE) ×2 IMPLANT
NS IRRIG 1000ML POUR BTL (IV SOLUTION) ×2 IMPLANT
PACK TOTAL JOINT (CUSTOM PROCEDURE TRAY) ×2 IMPLANT
PAD ARMBOARD 7.5X6 YLW CONV (MISCELLANEOUS) ×4 IMPLANT
PADDING CAST COTTON 6X4 STRL (CAST SUPPLIES) ×2 IMPLANT
POSITIONER HEAD PRONE TRACH (MISCELLANEOUS) ×2 IMPLANT
SET HNDPC FAN SPRY TIP SCT (DISPOSABLE) ×1 IMPLANT
SPONGE GAUZE 4X4 12PLY (GAUZE/BANDAGES/DRESSINGS) ×2 IMPLANT
STAPLER VISISTAT 35W (STAPLE) ×2 IMPLANT
SUCTION FRAZIER TIP 10 FR DISP (SUCTIONS) ×2 IMPLANT
SUT BONE WAX W31G (SUTURE) ×2 IMPLANT
SUT VIC AB 0 CTB1 27 (SUTURE) ×4 IMPLANT
SUT VIC AB 1 CT1 27 (SUTURE) ×3
SUT VIC AB 1 CT1 27XBRD ANBCTR (SUTURE) ×3 IMPLANT
SUT VIC AB 2-0 CT1 27 (SUTURE) ×2
SUT VIC AB 2-0 CT1 TAPERPNT 27 (SUTURE) ×2 IMPLANT
SYR CONTROL 10ML LL (SYRINGE) IMPLANT
TOWEL OR 17X24 6PK STRL BLUE (TOWEL DISPOSABLE) ×2 IMPLANT
TOWEL OR 17X26 10 PK STRL BLUE (TOWEL DISPOSABLE) ×2 IMPLANT
TRAY FOLEY CATH 14FR (SET/KITS/TRAYS/PACK) ×2 IMPLANT
WATER STERILE IRR 1000ML POUR (IV SOLUTION) ×6 IMPLANT

## 2011-10-21 NOTE — Transfer of Care (Signed)
Immediate Anesthesia Transfer of Care Note  Patient: Mary [name]  Procedure(s) Performed: Procedure(s) (LRB) with comments: TOTAL KNEE ARTHROPLASTY (Right)  Patient Location: PACU  Anesthesia Type: General  Level of Consciousness: awake, oriented, patient cooperative and responds to stimulation  Airway & Oxygen Therapy: Patient Spontanous Breathing and Patient connected to nasal cannula oxygen  Post-op Assessment: Report given to PACU RN and Post -op Vital signs reviewed and stable  Post vital signs: Reviewed and stable  Complications: No apparent anesthesia complications

## 2011-10-21 NOTE — Progress Notes (Signed)
Orthopedic Tech Progress Note Patient Details:  Mary [name] Jun 16, 1945 [id number] CPM applied to Right LE with appropriate settings; OHF with trapeze bar applied to bed CPM Right Knee CPM Right Knee: On Right Knee Flexion (Degrees): 90  Right Knee Extension (Degrees): 0    Asia R Thompson 10/21/2011, 10:24 AM

## 2011-10-21 NOTE — Anesthesia Postprocedure Evaluation (Signed)
Anesthesia Post Note  Patient: Mary [name]  Procedure(s) Performed: Procedure(s) (LRB): TOTAL KNEE ARTHROPLASTY (Right)  Anesthesia type: general  Patient location: PACU  Post pain: Pain level controlled  Post assessment: Patient's Cardiovascular Status Stable  Last Vitals:  Filed Vitals:   10/21/11 1119  BP: 147/68  Pulse: 90  Temp: 36.8 C  Resp: 16    Post vital signs: Reviewed and stable  Level of consciousness: sedated  Complications: No apparent anesthesia complications

## 2011-10-21 NOTE — Preoperative (Signed)
Beta Blockers   Reason not to administer Beta Blockers:Not Applicable 

## 2011-10-21 NOTE — Anesthesia Procedure Notes (Addendum)
Procedure Name: Intubation Date/Time: 10/21/2011 7:50 AM Performed by: Everlene Balls TODD Pre-anesthesia Checklist: Patient identified, Emergency Drugs available, Suction available, Patient being monitored and Timeout performed Oxygen Delivery Method: Circle system utilized Preoxygenation: Pre-oxygenation with 100% oxygen Intubation Type: IV induction Ventilation: Mask ventilation without difficulty Laryngoscope Size: Mac and 3 Grade View: Grade I Tube type: Oral Tube size: 7.5 mm Number of attempts: 1 Airway Equipment and Method: Stylet Placement Confirmation: ETT inserted through vocal cords under direct vision,  positive ETCO2,  breath sounds checked- equal and bilateral and CO2 detector Secured at: 22 cm Tube secured with: Tape Dental Injury: Teeth and Oropharynx as per pre-operative assessment     Anesthesia Regional Block:  Femoral nerve block  Pre-Anesthetic Checklist: ,, timeout performed, Correct Patient, Correct Site, Correct Laterality, Correct Procedure, Correct Position, site marked, Risks and benefits discussed,  Surgical consent,  Pre-op evaluation,  At surgeon's request and post-op pain management  Laterality: Right  Prep: chloraprep       Needles:  Injection technique: Single-shot  Needle Type: Echogenic Stimulator Needle     Needle Length:cm 9 cm Needle Gauge: 21 G    Additional Needles:  Procedures: ultrasound guided and nerve stimulator Femoral nerve block  Nerve Stimulator or Paresthesia:  Response: 0.4 mA,   Additional Responses:   Narrative:  Start time: 10/21/2011 7:10 AM End time: 10/21/2011 7:25 AM Injection made incrementally with aspirations every 5 mL.  Performed by: Personally  Anesthesiologist: Arta Bruce MD  Additional Notes: Monitors applied. Patient sedated. Sterile prep and drape,hand hygiene and sterile gloves were used. Relevant anatomy identified.Needle position confirmed.Local anesthetic injected incrementally after  negative aspiration. Local anesthetic spread visualized around nerve(s). Vascular puncture avoided. No complications. Image printed for medical record.The patient tolerated the procedure well.       Femoral nerve block

## 2011-10-21 NOTE — Evaluation (Signed)
Physical Therapy Evaluation Patient Details Name: Mary [name] MRN: [id number] DOB: 23-May-1945 Today's Date: 10/21/2011 Time: 9147-8295 PT Time Calculation (min): 33 min  PT Assessment / Plan / Recommendation Clinical Impression  Pt is a morbidly obese 66 y/o female s/p R TKA.  Pt lives alone but will have her daughter staying with her temporarily after surgery.  Pt mobility mostly limited by weakness in R LE (likely due to nerve block).  Acute PT to follow pt to progress functional mobility for safe return to home.     PT Assessment  Patient needs continued PT services    Follow Up Recommendations  Home health PT;Supervision/Assistance - 24 hour    Does the patient have the potential to tolerate intense rehabilitation      Barriers to Discharge None      Equipment Recommendations  3 in 1 bedside comode;Tub/shower bench (Barriatric sized.  )    Recommendations for Other Services     Frequency 7X/week    Precautions / Restrictions Precautions Precautions: Knee Precaution Booklet Issued: Yes (comment) Restrictions Weight Bearing Restrictions: Yes RLE Weight Bearing: Weight bearing as tolerated   Pertinent Vitals/Pain 1-2/10 pain in knee. No pain intervention required.        Mobility  Bed Mobility Bed Mobility: Supine to Sit;Sitting - Scoot to Edge of Bed Supine to Sit: 4: Min assist;With rails;HOB elevated Sitting - Scoot to [employer] of Bed: 4: Min assist Details for Bed Mobility Assistance: Assist for R LE. Cues for technique.  Transfers Transfers: Sit to Stand;Stand to Sit;Stand Pivot Transfers Sit to Stand: 3: Mod assist;With upper extremity assist;From bed Stand to Sit: 3: Mod assist;To chair/3-in-1;With upper extremity assist Stand Pivot Transfers: 2: Max assist Details for Transfer Assistance: Pt required max assist secondary to R LE weakness. Pt unable to advance L LE  secondary to weakness in R LE.  VC's for technique and hand placement .    Ambulation/Gait Ambulation/Gait Assistance: Not tested (comment) Wheelchair Mobility Wheelchair Mobility: No    Shoulder Instructions     Exercises Total Joint Exercises Ankle Circles/Pumps: 10 reps;Seated;Both;AROM   PT Diagnosis: Difficulty walking;Generalized weakness;Acute pain  PT Problem List: Decreased strength;Decreased range of motion;Decreased activity tolerance;Decreased mobility;Decreased knowledge of use of DME;Decreased knowledge of precautions;Obesity;Pain PT Treatment Interventions: DME instruction;Gait training;Stair training;Functional mobility training;Therapeutic activities;Therapeutic exercise;Patient/family education   PT Goals Acute Rehab PT Goals PT Goal Formulation: With patient Time For Goal Achievement: 10/28/11 Potential to Achieve Goals: Good Pt will go Supine/Side to Sit: with supervision;with HOB 0 degrees PT Goal: Supine/Side to Sit - Progress: Goal set today Pt will go Sit to Supine/Side: with supervision;with HOB 0 degrees PT Goal: Sit to Supine/Side - Progress: Goal set today Pt will go Sit to Stand: with supervision PT Goal: Sit to Stand - Progress: Goal set today Pt will go Stand to Sit: with supervision PT Goal: Stand to Sit - Progress: Goal set today Pt will Transfer Bed to Chair/Chair to Bed: with supervision PT Transfer Goal: Bed to Chair/Chair to Bed - Progress: Goal set today Pt will Ambulate: 51 - 150 feet;with supervision;with rolling walker PT Goal: Ambulate - Progress: Goal set today Pt will Go Up / Down Stairs: 1-2 stairs;with supervision;with rolling walker PT Goal: Up/Down Stairs - Progress: Goal set today Pt will Perform Home Exercise Program: with supervision, verbal cues required/provided PT Goal: Perform Home Exercise Program - Progress: Goal set today  Visit Information  Last PT Received On: 10/21/11    Subjective Data  Subjective: I  am nervous about this.  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   Prior  [employer] Living Lives With: Alone Available Help at Discharge: Family;Available 24 hours/day Type of Home: House Home Access: Stairs to enter [employer] of Steps: 1 Entrance Stairs-Rails: None Home Layout: One level Bathroom Shower/Tub: Engineer, manufacturing systems: Standard Home Adaptive Equipment: Walker - rolling Prior Function Level of Independence: Independent with assistive device(s) (Ambulates with a cane.) Able to Take Stairs?: Yes Driving: Yes Vocation: Full time employment Communication Communication: No difficulties Dominant Hand: Right    Cognition  Overall Cognitive Status: Appears within functional limits for tasks assessed/performed Arousal/Alertness: Awake/alert Orientation Level: Appears intact for tasks assessed Behavior During Session: [facility] for tasks performed    Extremity/Trunk Assessment Right Upper Extremity Assessment RUE ROM/Strength/Tone: Within functional levels Left Upper Extremity Assessment LUE ROM/Strength/Tone: Within functional levels Right Lower Extremity Assessment RLE ROM/Strength/Tone: Deficits;Due to pain RLE ROM/Strength/Tone Deficits: ROM and strength limited secondary to surgery Left Lower Extremity Assessment LLE ROM/Strength/Tone: WFL for tasks assessed (Painful secondary to OA)   Balance Balance Balance Assessed: No  End of Session PT - End of Session Equipment Utilized During Treatment: Gait belt Activity Tolerance: Patient tolerated treatment well Patient left: in chair;with call bell/phone within reach Nurse Communication: Mobility status CPM Right Knee CPM Right Knee: Off  GP     Taiwan Talcott 10/21/2011, 7:52 PM  Hartlyn Reigel L. Zhaire Locker DPT [phone]

## 2011-10-21 NOTE — Progress Notes (Signed)
UR COMPLETED  

## 2011-10-21 NOTE — H&P (Signed)
Mary [name] MRN:  [id number] DOB/SEX:  05/05/1945/female  CHIEF COMPLAINT:  Painful right Knee  HISTORY: Patient is a 66 y.o. female presented with a history of pain in the right knee. Onset of symptoms was gradual starting several years ago with gradually worsening course since that time. The patient noted no past surgery on the right knee. Prior procedures on the knee include meniscectomy. Patient has been treated conservatively with over-the-counter NSAIDs and activity modification. Patient currently rates pain in the knee at 9 out of 10 with activity. There is pain at night.  PAST MEDICAL HISTORY: There are no active problems to display for this patient.  Past Medical History  Diagnosis Date  . Diabetes mellitus   . Hypertension   . Hyperlipemia   . Arthritis     osteoarthritis   Past Surgical History  Procedure Date  . Carpal tunnel release   . Knee arthroscopy     right     MEDICATIONS:   Prescriptions prior to admission  Medication Sig Dispense Refill  . aspirin 81 MG tablet Take 81 mg by mouth daily.      . Cyanocobalamin (VITAMIN B-12) 1000 MCG SUBL Place 1,000 mcg under the tongue daily.      [name] HYDROcodone-acetaminophen (VICODIN) 5-500 MG per tablet Take 1 tablet by mouth every 6 (six) hours as needed. For pain      . insulin detemir (LEVEMIR) 100 UNIT/ML injection Inject 38 Units into the skin at bedtime.      . Liraglutide (VICTOZA) 18 MG/3ML SOLN Inject 1.8 mg into the skin at bedtime.       . rosuvastatin (CRESTOR) 10 MG tablet Take 10 mg by mouth daily.      [name] telmisartan-hydrochlorothiazide (MICARDIS HCT) 80-25 MG per tablet Take 1 tablet by mouth daily.        ALLERGIES:  No Known Allergies  REVIEW OF SYSTEMS:  Pertinent items are noted in HPI.   FAMILY HISTORY:  History reviewed. No pertinent family history.  SOCIAL HISTORY:   History  Substance Use Topics  . Smoking status: Current Every Day Smoker -- 0.5 packs/day for 20 years    Types:  Cigarettes  . Smokeless tobacco: Not on file  . Alcohol Use: No     EXAMINATION:  Vital signs in last 24 hours: Temp:  [98.3 F (36.8 C)] 98.3 F (36.8 C) (10/07 0609) Pulse Rate:  [96] 96  (10/07 0609) Resp:  [20] 20  (10/07 0609) BP: (140)/(91) 140/91 mmHg (10/07 0609) SpO2:  [97 %] 97 % (10/07 0609)  General appearance: alert, cooperative and no distress Lungs: clear to auscultation bilaterally Heart: regular rate and rhythm, S1, S2 normal, no murmur, click, rub or gallop Abdomen: soft, non-tender; bowel sounds normal; no masses,  no organomegaly Extremities: extremities normal, atraumatic, no cyanosis or edema and Homans sign is negative, no sign of DVT Pulses: 2+ and symmetric Skin: Skin color, texture, turgor normal. No rashes or lesions Neurologic: Alert and oriented X 3, normal strength and tone. Normal symmetric reflexes. Normal coordination and gait  Musculoskeletal:  ROM 0-110, Ligaments intact,  Imaging Review Plain radiographs demonstrate severe degenerative joint disease of the right knee. The overall alignment is mild varus. The bone quality appears to be good for age and reported activity level.  Assessment/Plan: End stage arthritis, right knee   The patient history, physical examination and imaging studies are consistent with advanced degenerative joint disease of the right knee. The patient has failed conservative treatment.  The  clearance notes were reviewed.  After discussion with the patient it was felt that Total Knee Replacement was indicated. The procedure,  risks, and benefits of total knee arthroplasty were presented and reviewed. The risks including but not limited to aseptic loosening, infection, blood clots, vascular injury, stiffness, patella tracking problems complications among others were discussed. The patient acknowledged the explanation, agreed to proceed with the plan.  Mary [name] 10/21/2011, 7:29 AM

## 2011-10-21 NOTE — Anesthesia Preprocedure Evaluation (Addendum)
Anesthesia Evaluation  Patient identified by MRN, date of birth, ID band Patient awake    Reviewed: Allergy & Precautions, H&P , NPO status , Patient's Chart, lab work & pertinent test results  Airway Mallampati: I TM Distance: >3 FB Neck ROM: Full    Dental  (+) Dental Advisory Given, Poor Dentition and Missing   Pulmonary former smoker,  breath sounds clear to auscultation        Cardiovascular hypertension, Pt. on medications Rhythm:Regular Rate:Normal     Neuro/Psych    GI/Hepatic Neg liver ROS, GERD-  Medicated,  Endo/Other  diabetes, Insulin Dependent  Renal/GU negative Renal ROS     Musculoskeletal   Abdominal (+) + obese,   Peds  Hematology negative hematology ROS (+)   Anesthesia Other Findings   Reproductive/Obstetrics                           Anesthesia Physical Anesthesia Plan  ASA: III  Anesthesia Plan: General   Post-op Pain Management:    Induction: Intravenous  Airway Management Planned: Oral ETT  Additional Equipment:   Intra-op Plan:   Post-operative Plan: Extubation in OR  Informed Consent: I have reviewed the patients History and Physical, chart, labs and discussed the procedure including the risks, benefits and alternatives for the proposed anesthesia with the patient or authorized representative who has indicated his/her understanding and acceptance.   Dental advisory given  Plan Discussed with: Surgeon and CRNA  Anesthesia Plan Comments:        Anesthesia Quick Evaluation

## 2011-10-22 LAB — BASIC METABOLIC PANEL
CO2: 28 mEq/L (ref 19–32)
Calcium: 9 mg/dL (ref 8.4–10.5)
Chloride: 102 mEq/L (ref 96–112)
Glucose, Bld: 108 mg/dL — ABNORMAL HIGH (ref 70–99)
Potassium: 3.8 mEq/L (ref 3.5–5.1)
Sodium: 136 mEq/L (ref 135–145)

## 2011-10-22 LAB — CBC
Hemoglobin: 9.8 g/dL — ABNORMAL LOW (ref 12.0–15.0)
MCH: 31.4 pg (ref 26.0–34.0)
RBC: 3.12 MIL/uL — ABNORMAL LOW (ref 3.87–5.11)

## 2011-10-22 LAB — GLUCOSE, CAPILLARY
Glucose-Capillary: 138 mg/dL — ABNORMAL HIGH (ref 70–99)
Glucose-Capillary: 121 mg/dL — ABNORMAL HIGH (ref 70–99)

## 2011-10-22 NOTE — Progress Notes (Addendum)
Physical Therapy Progress Note   10/22/11 1400  PT Visit Information  Last PT Received On 10/22/11  Assistance Needed +2  PT Time Calculation  PT Start Time 1352  PT Stop Time 1416  PT Time Calculation (min) 24 min  Subjective Data  Subjective I am feeling better than this morning  Patient Stated Goal To go home  Precautions  Precautions Knee  Restrictions  Weight Bearing Restrictions Yes  RLE Weight Bearing WBAT  Cognition  Overall Cognitive Status Appears within functional limits for tasks assessed/performed  Arousal/Alertness Awake/alert  Orientation Level Appears intact for tasks assessed  Behavior During Session [facility] for tasks performed  Bed Mobility  Bed Mobility Scooting to HOB  Scooting to HOB 1: +2 Total assist  Scooting to [facility]: Patient Percentage 80%  Details for Bed Mobility Assistance (A) to move pt to [facility]. Cues for proper technique  Transfers  Transfers Not assessed  Ambulation/Gait  Ambulation/Gait Assistance Not tested (comment)  Wheelchair Mobility  Wheelchair Mobility No  Balance  Balance Assessed No  Exercises  Exercises Total Joint  Total Joint Exercises  Ankle Circles/Pumps 10 reps;Both;AROM  Quad Sets AROM;Right;10 reps  Short [employer];Right;5 reps  Heel Slides AAROM;Right;10 reps  Hip ABduction/ADduction AAROM;Right;10 reps  PT - End of Session  Equipment Utilized During Treatment Gait belt  Activity Tolerance Patient tolerated treatment well;Patient limited by pain  Patient left in bed;with call bell/phone within reach;with family/visitor present  Nurse Communication Mobility status  PT - Assessment/Plan  Comments on Treatment Session Pt need +2 safety.  Pt performed HEP with minimal cues.  Plan for next session is to increase ambulation distance, gait quality, and stair negotiation.  PT Plan Discharge plan remains appropriate;Frequency remains appropriate  PT Frequency 7X/week  Recommendations for Other Services Other (comment) (None)   Follow Up Recommendations Home health PT;Supervision/Assistance - 24 hour  Equipment Recommended 3 in 1 bedside comode;Tub/shower bench  Acute Rehab PT Goals  PT Goal Formulation With patient  Time For Goal Achievement 10/28/11  Potential to Achieve Goals Good  Pt will Perform Home Exercise Program with supervision, verbal cues required/provided  PT Goal: Perform Home Exercise Program - Progress Progressing toward goal    Pain in R LE 4-5/10.  Collis Thede, SPT

## 2011-10-22 NOTE — Progress Notes (Signed)
I agree with the following treatment note after reviewing documentation.   Johnston, Lamere Lightner Brynn   OTR/L Pager: [phone] Office: [phone] .   

## 2011-10-22 NOTE — Progress Notes (Signed)
  [name], MD   Altamese Cabal, PA-C [address] [place], [place], [place]  [address]                             [phone]   PROGRESS NOTE  Subjective:  negative for Chest Pain  negative for Shortness of Breath  negative for Nausea/Vomiting   negative for Calf Pain  negative for Bowel Movement   Tolerating Diet: yes         Patient reports pain as 8 on 0-10 scale.    Objective: Vital signs in last 24 hours:   Patient Vitals for the past 24 hrs:  BP Temp Temp src Pulse Resp SpO2  10/22/11 0701 112/56 mmHg 98.2 F (36.8 C) - 78  18  97 %  10/22/11 0315 123/68 mmHg 98.2 F (36.8 C) - - 18  100 %  10/21/11 2339 114/64 mmHg 98.5 F (36.9 C) - 96  18  96 %  10/21/11 1600 - - - - 16  96 %  10/21/11 1400 120/56 mmHg 98.3 F (36.8 C) - 87  18  100 %  10/21/11 1200 - - - - 16  98 %  10/21/11 1119 147/68 mmHg 98.3 F (36.8 C) Oral 90  16  98 %  10/21/11 1045 - - - 88  17  98 %  10/21/11 1030 - - - 83  16  98 %  10/21/11 1027 149/72 mmHg - - - - -  10/21/11 1015 126/90 mmHg - - 88  18  99 %  10/21/11 1000 110/66 mmHg 97 F (36.1 C) - 96  20  98 %  10/21/11 0955 - 97 F (36.1 C) - - - -    @flow {1959:LAST@   Intake/Output from previous day:   10/07 0701 - 10/08 0700 In: 3950 [P.O.:1440; I.V.:2510] Out: 1150 [Urine:1150]   Intake/Output this shift:   10/08 0701 - 10/08 1900 In: -  Out: 350 [Urine:350]   Intake/Output      10/07 0701 - 10/08 0700 10/08 0701 - 10/09 0700   P.O. 1440    I.V. 2510    Total Intake 3950    Urine 1150 350   Total Output 1150 350   Net +2800 -350           LABORATORY DATA:  Basename 10/22/11 0520  WBC 10.0  HGB 9.8*  HCT 28.4*  PLT 245    Basename 10/22/11 0520  NA 136  K 3.8  CL 102  CO2 28  BUN 23  CREATININE 1.21*  GLUCOSE 108*  CALCIUM 9.0   Lab Results  Component Value Date   INR 0.97 10/11/2011    Examination:  General appearance: alert, cooperative and no distress Extremities: Homans sign is  negative, no sign of DVT  Wound Exam: clean, dry, intact   Drainage:  Moderate amount Serosanguinous exudate  Motor Exam: EHL and FHL Intact  Sensory Exam: Deep Peroneal normal  Vascular Exam:    Assessment:    1 Day Post-Op  Procedure(s) (LRB): TOTAL KNEE ARTHROPLASTY (Right)  ADDITIONAL DIAGNOSIS:  Active Problems:  * No active hospital problems. *   Acute Blood Loss Anemia   Plan: Physical Therapy as ordered Weight Bearing as Tolerated (WBAT)  DVT Prophylaxis:  Lovenox  DISCHARGE PLAN: Skilled Nursing Facility/Rehab  DISCHARGE NEEDS: HHPT, CPM, Walker and 3-in-1 comode seat         Sundee Garland 10/22/2011, 8:10 AM

## 2011-10-22 NOTE — Progress Notes (Signed)
Agree with treatment and progress as well as current concerns of 66 y/o daughter being only assistance for pt at d/c.  10/22/2011 Cephus Shelling, PT, DPT [phone]

## 2011-10-22 NOTE — Op Note (Signed)
TOTAL KNEE REPLACEMENT OPERATIVE NOTE:  10/21/2011  9:51 AM  PATIENT:  Mary [name]  66 y.o. female  PRE-OPERATIVE DIAGNOSIS:  osteoarthritis right knee  POST-OPERATIVE DIAGNOSIS:  osteoarthritis right knee  PROCEDURE:  Procedure(s): TOTAL KNEE ARTHROPLASTY  SURGEON:  Surgeon(s): Raymon Mutton, MD  PHYSICIAN ASSISTANT: Altamese Cabal, [facility]  ANESTHESIA:   general  DRAINS: Hemovac and On-Q Marcaine Pain Pump  SPECIMEN: None  COUNTS:  Correct  TOURNIQUET:   Total Tourniquet Time Documented: Thigh (Right) - 52 minutes  DICTATION:  Indication for procedure:    The patient is a 66 y.o. female who has failed conservative treatment for osteoarthritis right knee.  Informed consent was obtained prior to anesthesia. The risks versus benefits of the operation were explain and in a way the patient can, and did, understand.   Description of procedure:     The patient was taken to the operating room and placed under anesthesia.  The patient was positioned in the usual fashion taking care that all body parts were adequately padded and/or protected.  I foley catheter was placed.  A tourniquet was applied and the leg prepped and draped in the usual sterile fashion.  The extremity was exsanguinated with the esmarch and tourniquet inflated to 350 mmHg.  Pre-operative range of motion was normal.  The knee was in 5 degree of mild varus.  A midline incision approximately 6-7 inches long was made with a #10 blade.  A new blade was used to make a parapatellar arthrotomy going 2-3 cm into the quadriceps tendon, over the patella, and alongside the medial aspect of the patellar tendon.  A synovectomy was then performed with the #10 blade and forceps. I then elevated the deep MCL off the medial tibial metaphysis subperiosteally around to the semimembranosus attachment.    I everted the patella and used calipers to measure patellar thickness.  I used the reamer to ream down to appropriate thickness to  recreate the native thickness.  I then removed excess bone with the rongeur and sagittal saw.  I used the appropriately sized template and drilled the three lug holes.  I then put the trial in place and measured the thickness with the calipers to ensure recreation of the native thickness.  The trial was then removed and the patella subluxed and the knee brought into flexion.  A homan retractor was place to retract and protect the patella and lateral structures.  A Z-retractor was place medially to protect the medial structures.  The extra-medullary alignment system was used to make cut the tibial articular surface perpendicular to the anamotic axis of the tibia and in 3 degrees of posterior slope.  The cut surface and alignment jig was removed.  I then used the intramedullary alignment guide to make a 6 valgus cut on the distal femur.  I then marked out the epicondylar axis on the distal femur.  The posterior condylar axis measured 3 degrees.  I then used the anterior referencing sizer and measured the femur to be a size F.  The 4-In-1 cutting block was screwed into place in external rotation matching the posterior condylar angle, making our cuts perpendicular to the epicondylar axis.  Anterior, posterior and chamfer cuts were made with the sagittal saw.  The cutting block and cut pieces were removed.  A lamina spreader was placed in 90 degrees of flexion.  The ACL, PCL, menisci, and posterior condylar osteophytes were removed.  A 12 mm spacer blocked was found to offer good flexion and  extension gap balance after minimal in degree releasing.   The scoop retractor was then placed and the femoral finishing block was pinned in place.  The small sagittal saw was used as well as the lug drill to finish the femur.  The block and cut surfaces were removed and the medullary canal hole filled with autograft bone from the cut pieces.  The tibia was delivered forward in deep flexion and external rotation.  A size 5  tray was selected and pinned into place centered on the medial 1/3 of the tibial tubercle.  The reamer and keel was used to prepare the tibia through the tray.    I then trialed with the size F femur, size 5 tibia, a 12 mm insert and the 32 patella.  I had excellent flexion/extension gap balance, excellent patella tracking.  Flexion was full and beyond 120 degrees; extension was zero.  These components were chosen and the staff opened them to me on the back table while the knee was lavaged copiously and the cement mixed.  I cemented in the components and removed all excess cement.  The polyethylene tibial component was snapped into place and the knee placed in extension while cement was hardening.  The capsule was infilltrated with 20cc of .25% Marcaine with epinephrine.  A hemovac was place in the joint exiting superolaterally.  A pain pump was place superomedially superficial to the arthrotomy.  Once the cement was hard, the tourniquet was let down.  Hemostasis was obtained.  The arthrotomy was closed with figure-8 #1 vicryl sutures.  The deep soft tissues were closed with #0 vicryls and the subcuticular layer closed with a running #2-0 vicryl.  The skin was reapproximated and closed with skin staples.  The wound was dressed with xeroform, 4 x4's, 2 ABD sponges, a single layer of webril and a TED stocking.   The patient was then awakened, extubated, and taken to the recovery room in stable condition.  BLOOD LOSS:  300cc DRAINS: 1 hemovac, 1 pain catheter COMPLICATIONS:  None.  PLAN OF CARE: Admit to inpatient   PATIENT DISPOSITION:  PACU - hemodynamically stable.   Delay start of Pharmacological VTE agent (>24hrs) due to surgical blood loss or risk of bleeding:  not applicable  Please fax a copy of this op note to my office at [phone] (please only include page 1 and 2 of the Case Information op note)

## 2011-10-22 NOTE — Progress Notes (Signed)
Pt noted to have on 10/21/11 at 11:33 on admission a diabetic ulcer on the left side of her right great toe. Measured 1.8x2cm. Area is circular, crusted, callused and dry. No drainage noted. Open to air. PA Jones ordered a wound consult on 10/22/11 at 17:09.

## 2011-10-22 NOTE — Progress Notes (Signed)
Agree with treatment and progress.  10/22/2011 Audrina Marten M Tammala Weider, PT, DPT [phone]   

## 2011-10-22 NOTE — Progress Notes (Signed)
Referral received for SNF. Chart reviewed and CSW has spoken with RNCM who indicates that patient is for DC to home with Home Health and DME.  CSW to sign off. Please re-consult if CSW needs arise.  Gatha Mcnulty T. Gedalia Mcmillon, LCSWA  [phone]  

## 2011-10-22 NOTE — Progress Notes (Signed)
CARE MANAGEMENT NOTE 10/22/2011  Patient:  SHERLENE, RICKEL   Account Number:  [id number]  Date Initiated:  10/22/2011  Documentation initiated by:  Vance Peper  Subjective/Objective Assessment:   66 yr old female s/p right total knee arthroplasty.     Action/Plan:   CM spoke with patient regarding home health and DME needs at discharge. Choice offered. Patient preoperatively setup with [facility], no changes. Pt has rolling walker, needs 3in1 and CPM.Will call TNT.   Anticipated DC Date:  10/24/2011   Anticipated DC Plan:  HOME W HOME HEALTH SERVICES      DC Planning Services  CM consult      [facility] Choice  HOME HEALTH   Choice offered to / List presented to:  C-1 Patient        HH arranged  HH-2 PT      [facility] agency  [facility]   Status of service:  In process, will continue to follow Medicare Important Message given?   (If response is "NO", the following Medicare IM given date fields will be blank) Date Medicare IM given:   Date Additional Medicare IM given:    Discharge Disposition:  HOME W HOME HEALTH SERVICES  Per UR Regulation:    If discussed at Long Length of Stay Meetings, dates discussed:    Comments:

## 2011-10-22 NOTE — Progress Notes (Signed)
Occupational Therapy Evaluation Patient Details Name: Mary [name] MRN: [id number] DOB: Jan 21, 1945 Today's Date: 10/22/2011 Time: 4098-1191 OT Time Calculation (min): 24 min  OT Assessment / Plan / Recommendation Clinical Impression  Pt. 66 yo female s/p right TKA. Pt. is +2 for safety during transfers and ambulation. Pt. requires max vc's for proper hand placement, technique and overall safety with the RW. Pt. would benefit from OT acutely to increase independence with ADL's.    OT Assessment  Patient needs continued OT Services    Follow Up Recommendations  Home health OT;Supervision/Assistance - 24 hour    Barriers to Discharge      Equipment Recommendations  3 in 1 bedside comode;Tub/shower bench    Recommendations for Other Services    Frequency  Min 2X/week    Precautions / Restrictions Precautions Precautions: Knee Restrictions Weight Bearing Restrictions: Yes RLE Weight Bearing: Weight bearing as tolerated   Pertinent Vitals/Pain 8/10 in RLE during activity    ADL  Grooming: Teeth care;Wash/dry hands;Min guard Where Assessed - Grooming: Supported standing Toilet Transfer: Chief of Staff: Patient Percentage: 80% Statistician Method: Sit to Barista: Regular height toilet Equipment Used: Gait belt;Rolling walker Transfers/Ambulation Related to ADLs: Pt. is total (A) +2 for transfers and ambulation due to decrease balance and pain. Pt. requires cues for proper sequencing of RW ADL Comments: Pt. completed grooming tasks at sink level with min guard. Pt. required extra time due to fatigue and frequent reminders to take deep breaths    OT Diagnosis: Generalized weakness;Acute pain  OT Problem List: Decreased activity tolerance;Decreased safety awareness;Decreased knowledge of use of DME or AE;Decreased knowledge of precautions;Pain OT Treatment Interventions: Self-care/ADL training;DME and/or AE  instruction;Therapeutic activities;Patient/family education   OT Goals Acute Rehab OT Goals OT Goal Formulation: With patient Time For Goal Achievement: 11/05/11 Potential to Achieve Goals: Good ADL Goals Pt Will Perform Grooming: with supervision;Standing at sink ADL Goal: Grooming - Progress: Goal set today Pt Will Perform Lower Body Dressing: with min assist;Sit to stand from chair;with adaptive equipment ADL Goal: Lower Body Dressing - Progress: Goal set today Pt Will Transfer to Toilet: with min assist;Ambulation;3-in-1 ADL Goal: Toilet Transfer - Progress: Goal set today Pt Will Perform Toileting - Hygiene: with supervision;Sitting on 3-in-1 or toilet ADL Goal: Toileting - Hygiene - Progress: Goal set today Pt Will Perform Tub/Shower Transfer: Tub transfer;with min assist;Ambulation;Transfer tub bench ADL Goal: Tub/Shower Transfer - Progress: Goal set today  Visit Information  Last OT Received On: 10/22/11 Assistance Needed: +2    Subjective Data  Subjective: I know you girls wont let me fall Patient Stated Goal: To get better   Prior Functioning     Home Living Lives With: Alone Available Help at Discharge: Family;Available 24 hours/day Type of Home: House Home Access: Stairs to enter [employer] of Steps: 1 Entrance Stairs-Rails: None Home Layout: One level Bathroom Shower/Tub: Engineer, manufacturing systems: Standard Home Adaptive Equipment: Walker - rolling Prior Function Level of Independence: Independent with assistive device(s) Able to Take Stairs?: Yes Driving: Yes Vocation: Full time employment Communication Communication: No difficulties Dominant Hand: Right         Vision/Perception     Cognition  Overall Cognitive Status: Appears within functional limits for tasks assessed/performed Arousal/Alertness: Awake/alert Orientation Level: Appears intact for tasks assessed Behavior During Session: [facility] for tasks performed      Extremity/Trunk Assessment Right Upper Extremity Assessment RUE ROM/Strength/Tone: Within functional levels Left Upper Extremity Assessment LUE ROM/Strength/Tone: Within  functional levels     Mobility Bed Mobility Bed Mobility: Supine to Sit;Sitting - Scoot to Edge of Bed Supine to Sit: 4: Min assist;HOB flat;With rails Sitting - Scoot to Edge of Bed: 4: Min assist Details for Bed Mobility Assistance: Requires (A) to support RLE and cues for safest hand placement and proper technique for supine<>sit Transfers Transfers: Sit to Stand;Stand to Sit Sit to Stand: 1: +2 Total assist;With upper extremity assist;From bed Sit to Stand: Patient Percentage: 80% Stand to Sit: 3: Mod assist;With upper extremity assist;To chair/3-in-1 Details for Transfer Assistance: +2 for safety. (A) with facilitating sit<>stand transfer by rocking back and forth for momentum. Pt. required cues for correct hand placement and safe use of RW      Shoulder Instructions     Exercise     Balance Balance Balance Assessed: No   End of Session OT - End of Session Equipment Utilized During Treatment: Gait belt Activity Tolerance: Patient tolerated treatment well Patient left: in chair;with call bell/phone within reach;with nursing in room Nurse Communication: Mobility status CPM Right Knee CPM Right Knee: Off  GO     Cleora Fleet 10/22/2011, 12:02 PM

## 2011-10-22 NOTE — Progress Notes (Signed)
Physical Therapy Treatment Patient Details Name: Mary [name] MRN: [id number] DOB: Jun 03, 1945 Today's Date: 10/22/2011 Time: 4098-1191 PT Time Calculation (min): 27 min  PT Assessment / Plan / Recommendation Comments on Treatment Session  Pt needed +2 assistance for transfers and ambulation for safety and balance.  WIll continue to provide Home Health physical therapy in hopes of progress over hospital stay but have concerns about 66 y.o. daughter not being enough to provide proper safe care needed in the home.  Will continue to assess to determine safety for d/c.    Follow Up Recommendations  Home health PT;Supervision/Assistance - 24 hour     Does the patient have the potential to tolerate intense rehabilitation     Barriers to Discharge        Equipment Recommendations  3 in 1 bedside comode;Tub/shower bench    Recommendations for Other Services Other (comment) (None)  Frequency 7X/week   Plan Discharge plan remains appropriate;Frequency remains appropriate    Precautions / Restrictions Precautions Precautions: Knee Restrictions Weight Bearing Restrictions: Yes RLE Weight Bearing: Weight bearing as tolerated   Pertinent Vitals/Pain 8/10 pain in R LE.    Mobility  Bed Mobility Bed Mobility: Supine to Sit;Sitting - Scoot to Edge of Bed Supine to Sit: 4: Min assist;HOB flat;With rails Sitting - Scoot to Edge of Bed: 4: Min assist Details for Bed Mobility Assistance: (A) supporting R LE.  Max cues for hand placement, proper technique, and safety Transfers Transfers: Sit to Stand;Stand to Sit Sit to Stand: 1: +2 Total assist;With upper extremity assist;From bed Sit to Stand: Patient Percentage: 80% Stand to Sit: 3: Mod assist;With upper extremity assist;To chair/3-in-1 Details for Transfer Assistance: +2 for safety.  (A) with initiating mvt and balance.  Max cues for hand placement, proper technique, foot placement, and safety awareness Ambulation/Gait Ambulation/Gait  Assistance: 1: +2 Total assist Ambulation/Gait: Patient Percentage: 90% Ambulation Distance (Feet): 20 Feet Assistive device: Rolling walker Ambulation/Gait Assistance Details: +2 for safety.  (A) with RW placement and balance.  Max cues for proper sequencing, proper technique, RW placement, hand placement, posture, and safety. Gait Pattern: Step-to pattern;Decreased stride length;Antalgic;Wide base of support Gait velocity: decreased Stairs: No Wheelchair Mobility Wheelchair Mobility: No    Exercises     PT Diagnosis:    PT Problem List:   PT Treatment Interventions:     PT Goals Acute Rehab PT Goals PT Goal Formulation: With patient Time For Goal Achievement: 10/28/11 Potential to Achieve Goals: Good Pt will go Supine/Side to Sit: with supervision;with HOB 0 degrees PT Goal: Supine/Side to Sit - Progress: Progressing toward goal Pt will go Sit to Stand: with supervision PT Goal: Sit to Stand - Progress: Progressing toward goal Pt will go Stand to Sit: with supervision PT Goal: Stand to Sit - Progress: Progressing toward goal Pt will Ambulate: 51 - 150 feet;with supervision;with rolling walker PT Goal: Ambulate - Progress: Progressing toward goal  Visit Information  Last PT Received On: 10/22/11 Assistance Needed: +2 PT/OT Co-Evaluation/Treatment: Yes    Subjective Data  Subjective: You aren't going to hurt me are you? Patient Stated Goal: To go home   Cognition  Overall Cognitive Status: Appears within functional limits for tasks assessed/performed Arousal/Alertness: Awake/alert Orientation Level: Appears intact for tasks assessed Behavior During Session: [facility] for tasks performed    Balance  Balance Balance Assessed: No  End of Session PT - End of Session Equipment Utilized During Treatment: Gait belt Activity Tolerance: Patient tolerated treatment well;Patient limited by pain Patient left:  in chair;with call bell/phone within reach (Case manager in room) Nurse  Communication: Mobility status   GP     Mary [name] 10/22/2011, 11:43 AM

## 2011-10-22 NOTE — Progress Notes (Signed)
Orthopedic Tech Progress Note Patient Details:  Mary [name] January 17, 1945 [id number]  Patient ID: Mary [name], female   DOB: [birth year], 66 y.o.   MRN: [id number] Adjusted cpm ; pt restarted on cpm@1710 ;rn notified  Nikki Dom 10/22/2011, 7:33 PM

## 2011-10-23 LAB — CBC
Hemoglobin: 9.6 g/dL — ABNORMAL LOW (ref 12.0–15.0)
MCH: 31 pg (ref 26.0–34.0)
MCHC: 34.5 g/dL (ref 30.0–36.0)
Platelets: 258 10*3/uL (ref 150–400)

## 2011-10-23 LAB — BASIC METABOLIC PANEL
Calcium: 9 mg/dL (ref 8.4–10.5)
Creatinine, Ser: 1.04 mg/dL (ref 0.50–1.10)
GFR calc non Af Amer: 56 mL/min — ABNORMAL LOW (ref >90)
Glucose, Bld: 112 mg/dL — ABNORMAL HIGH (ref 70–99)
Sodium: 135 mEq/L (ref 135–145)

## 2011-10-23 LAB — GLUCOSE, CAPILLARY

## 2011-10-23 MED ORDER — OXYCODONE HCL 10 MG PO TB12: 10.0000 mg | ORAL_TABLET | Freq: Two times a day (BID) | ORAL | Status: DC

## 2011-10-23 MED ORDER — METHOCARBAMOL 500 MG PO TABS: 500.0000 mg | ORAL_TABLET | Freq: Four times a day (QID) | ORAL | Status: DC | PRN

## 2011-10-23 MED ORDER — ENOXAPARIN SODIUM 40 MG/0.4ML ~~LOC~~ SOLN: 40.0000 mg | Freq: Every day | SUBCUTANEOUS | Status: DC

## 2011-10-23 MED ORDER — OXYCODONE HCL 5 MG PO TABS: 5.0000 mg | ORAL_TABLET | ORAL | Status: DC | PRN

## 2011-10-23 MED ORDER — CELECOXIB 200 MG PO CAPS: 200.0000 mg | ORAL_CAPSULE | Freq: Two times a day (BID) | ORAL | Status: DC

## 2011-10-23 NOTE — Progress Notes (Signed)
  [name], MD   Altamese Cabal, PA-C [address] [place], [place], [place]  [address]                             [phone]   PROGRESS NOTE  Subjective:  negative for Chest Pain  negative for Shortness of Breath  negative for Nausea/Vomiting   negative for Calf Pain  negative for Bowel Movement   Tolerating Diet: yes         Patient reports pain as 5 on 0-10 scale.    Objective: Vital signs in last 24 hours:   Patient Vitals for the past 24 hrs:  BP Temp Temp src Pulse Resp SpO2  10/23/11 0713 143/60 mmHg 97.8 F (36.6 C) - 102  18  99 %  10/22/11 2220 160/94 mmHg 100.1 F (37.8 C) Oral 119  20  98 %  10/22/11 1600 - - - - 16  99 %  10/22/11 1400 125/60 mmHg 98.3 F (36.8 C) - 97  16  99 %  10/22/11 1200 - - - - 18  97 %  10/22/11 0800 - - - - 16  98 %    @flow {1959:LAST@   Intake/Output from previous day:   10/08 0701 - 10/09 0700 In: 840 [P.O.:840] Out: 2350 [Urine:2350]   Intake/Output this shift:       Intake/Output      10/08 0701 - 10/09 0700 10/09 0701 - 10/10 0700   P.O. 840    I.V.     Total Intake 840    Urine 2350    Total Output 2350    Net -1510            LABORATORY DATA:  Basename 10/22/11 0520  WBC 10.0  HGB 9.8*  HCT 28.4*  PLT 245    Basename 10/22/11 0520  NA 136  K 3.8  CL 102  CO2 28  BUN 23  CREATININE 1.21*  GLUCOSE 108*  CALCIUM 9.0   Lab Results  Component Value Date   INR 0.97 10/11/2011    Examination:  General appearance: alert, cooperative and no distress Extremities: Homans sign is negative, no sign of DVT  Wound Exam: clean, dry, intact   Drainage:  None: wound tissue dry  Motor Exam: EHL and FHL Intact  Sensory Exam: Deep Peroneal normal  Vascular Exam:    Assessment:    2 Days Post-Op  Procedure(s) (LRB): TOTAL KNEE ARTHROPLASTY (Right)  ADDITIONAL DIAGNOSIS:  Active Problems:  * No active hospital problems. *   Acute Blood Loss Anemia   Plan: Physical Therapy as ordered  Weight Bearing as Tolerated (WBAT)  DVT Prophylaxis:  Lovenox  DISCHARGE PLAN: Home  DISCHARGE NEEDS: HHPT, CPM, Walker and 3-in-1 comode seat         Jody Aguinaga 10/23/2011, 7:42 AM

## 2011-10-23 NOTE — Progress Notes (Signed)
Nursing asked CSW to talk to patient again to reassess possible need for short term SNF.  Met with patient this afternoon- she stated that she "considered" SNF due to slow progress with therapy but she has done better today and her daughter will stay with her at home.  She plans to go home with HH/DME - arranged by [facility].  CSW will sign off again.  Lorri Frederick. [place]  [phone]

## 2011-10-23 NOTE — Consult Note (Signed)
WOC consult Note Reason for Consult: right great toe ulcer.   Pt reports present for several weeks.  Has bunion noted on the first metatarsal head and large callous of the right great toe laterally. It is not open or draining at the current time.  She reports he does drain sometimes, has small fissure noted in the middle of the callous.  Wound type: callous and bunion of the right foot. Measurement: 3.0cm x 3.0cm x 0cm  Wound bed:100% calloused skin   Drainage (amount, consistency, odor) none Periwound:intact Dressing procedure/placement/frequency: no topical care needed.  Suggested podiatry follow up as outpatient, she really needs custom fitting shoes to avoid further pressure on these areas.  With DM hx would suggest only podiatry to do any tx on these areas (paring or otherwise). She verbalized understanding of need to arrange follow up after dc.  Re consult if needed, will not follow at this time. Thanks  Cray Monnin [employer], CWOCN [phone])

## 2011-10-23 NOTE — Progress Notes (Signed)
Physical Therapy Progress Note   10/23/11 1500  PT Visit Information  Last PT Received On 10/23/11  Assistance Needed +2  PT Time Calculation  PT Start Time 1454  PT Stop Time 1523  PT Time Calculation (min) 29 min  Subjective Data  Subjective I am ready to get back into bed  Patient Stated Goal To go home when safe  Precautions  Precautions Knee  Restrictions  Weight Bearing Restrictions Yes  RLE Weight Bearing WBAT  Cognition  Overall Cognitive Status Appears within functional limits for tasks assessed/performed  Arousal/Alertness Awake/alert  Orientation Level Appears intact for tasks assessed  Behavior During Session [facility] for tasks performed  Bed Mobility  Bed Mobility Sit to Supine;Scooting to HOB  Sit to Supine HOB flat;1: +2 Total assist  Sit to Supine: Patient Percentage 80%  Details for Bed Mobility Assistance +2 for safety.  (A) for LE and UE support into bed.  Max cues for proper technique, positioning, and safety.  Transfers  Transfers Sit to Stand;Stand to Sit  Sit to Stand 1: +2 Total assist;With upper extremity assist;From chair/3-in-1  Sit to Stand: Patient Percentage 80%  Stand to Sit 1: +2 Total assist;To bed  Stand to Sit: Patient Percentage 90%  Stand Pivot Transfers 1: +2 Total assist  Stand Pivot Transfers: Patient Percentage 90%  Details for Transfer Assistance +2 for safety.  (A) initiating mvt, balance, and slowing descent.  Max cues for proper technique and safety.  Ambulation/Gait  Ambulation/Gait Assistance Not tested (comment)  Stairs No  Wheelchair Mobility  Wheelchair Mobility No  Balance  Balance Assessed No  Exercises  Exercises Total Joint  Total Joint Exercises  Ankle Circles/Pumps 10 reps;Both;AROM  Quad Sets AROM;Right;5 reps  Short Arc [place];Right;5 reps  Heel Slides AAROM;Right;5 reps  Hip ABduction/ADduction AROM;Right;5 reps  PT - End of Session  Activity Tolerance Patient limited by pain  Patient left in bed;with call  bell/phone within reach;with nursing in room  Nurse Communication Mobility status  PT - Assessment/Plan  Comments on Treatment Session +2 needed for safety.  Pt very fatigued and required two people to assist with transfers and bed mobility.  Pt not safe for d/c until pt can increase ambulation distance and stair negotiation safely.  PT Plan Discharge plan remains appropriate;Frequency remains appropriate  PT Frequency 7X/week  Recommendations for Other Services Other (comment) (none)  Follow Up Recommendations Home health PT;Supervision/Assistance - 24 hour  Equipment Recommended 3 in 1 bedside comode;Tub/shower bench  Acute Rehab PT Goals  PT Goal Formulation With patient  Time For Goal Achievement 10/28/11  Potential to Achieve Goals Good  Pt will go Sit to Supine/Side with supervision;with HOB 0 degrees  PT Goal: Sit to Supine/Side - Progress Not met  Pt will go Sit to Stand with supervision  PT Goal: Sit to Stand - Progress Not met  Pt will go Stand to Sit with supervision  PT Goal: Stand to Sit - Progress Not met  Pt will Transfer Bed to Chair/Chair to Bed with supervision  PT Transfer Goal: Bed to Chair/Chair to Bed - Progress Not met  Pt will Perform Home Exercise Program with supervision, verbal cues required/provided  PT Goal: Perform Home Exercise Program - Progress Progressing toward goal    Pt reported pain 6/10 in R LE.  Linzie Boursiquot, SPT

## 2011-10-23 NOTE — Progress Notes (Signed)
Agree with PT progress note.  [place], [place] DPT [phone]

## 2011-10-23 NOTE — Progress Notes (Signed)
Physical Therapy Treatment Patient Details Name: Mary [name] MRN: [id number] DOB: [birth year] Today's Date: 10/23/2011 Time: 1478-2956 PT Time Calculation (min): 28 min  PT Assessment / Plan / Recommendation Comments on Treatment Session  Pt needed +2 for safety.  During ambulation pt became hot and c/o not feeling well.  Pt blood sugar was checked and read 127. D/c plans were discussed.  Pt is not safe for d/c until stair negotiation is performed safely.      Follow Up Recommendations  Home health PT;Supervision/Assistance - 24 hour     Does the patient have the potential to tolerate intense rehabilitation     Barriers to Discharge  NONE      Equipment Recommendations  3 in 1 bedside comode;Tub/shower bench    Recommendations for Other Services Other (comment) (None)  Frequency 7X/week   Plan Discharge plan remains appropriate;Frequency remains appropriate    Precautions / Restrictions Precautions Precautions: Knee Restrictions Weight Bearing Restrictions: Yes RLE Weight Bearing: Weight bearing as tolerated   Pertinent Vitals/Pain 5/10 Pain R LE    Mobility  Bed Mobility Bed Mobility: Not assessed Transfers Transfers: Sit to Stand;Stand to Sit Sit to Stand: 1: +2 Total assist;With upper extremity assist;From chair/3-in-1;From toilet Sit to Stand: Patient Percentage: 80% Stand to Sit: 3: Mod assist;With upper extremity assist;To chair/3-in-1;To toilet Details for Transfer Assistance: +2 for safety.  (A) with initiating mvt and balance.  Pt unable to use upper body strength to stand up from chair.  Cues for hand placement and proper technique. Ambulation/Gait Ambulation/Gait Assistance: 1: +2 Total assist Ambulation/Gait: Patient Percentage: 90% Ambulation Distance (Feet): 15 Feet Assistive device: Rolling walker Ambulation/Gait Assistance Details: +2 for safety.  (A) with RW placement and balance.  Max cues for RW placement, hand placement, proper technique, posture,  and safety. Gait Pattern: Step-to pattern;Decreased stride length;Antalgic;Wide base of support Gait velocity: decreased Stairs: No Wheelchair Mobility Wheelchair Mobility: No    Exercises     PT Diagnosis:    PT Problem List:   PT Treatment Interventions:     PT Goals Acute Rehab PT Goals PT Goal Formulation: With patient Time For Goal Achievement: 10/28/11 Potential to Achieve Goals: Good Pt will go Sit to Stand: with supervision PT Goal: Sit to Stand - Progress: Progressing toward goal Pt will go Stand to Sit: with supervision PT Goal: Stand to Sit - Progress: Progressing toward goal Pt will Ambulate: 51 - 150 feet;with supervision;with rolling walker PT Goal: Ambulate - Progress: Progressing toward goal  Visit Information  Last PT Received On: 10/23/11 Assistance Needed: +2    Subjective Data  Subjective: I do not feel I am ready to go home today. Patient Stated Goal: To go home tomorrow   Cognition  Overall Cognitive Status: Appears within functional limits for tasks assessed/performed Arousal/Alertness: Awake/alert Orientation Level: Appears intact for tasks assessed Behavior During Session: [facility] for tasks performed    Balance  Balance Balance Assessed: No  End of Session PT - End of Session Activity Tolerance: Patient limited by pain Patient left: in chair;with call bell/phone within reach Nurse Communication: Mobility status   GP     DITOMMASO, AMY 10/23/2011, 1:15 PM  Jake Shark, PT DPT [phone]

## 2011-10-24 LAB — BASIC METABOLIC PANEL
CO2: 24 mEq/L (ref 19–32)
Chloride: 104 mEq/L (ref 96–112)
Creatinine, Ser: 1.13 mg/dL — ABNORMAL HIGH (ref 0.50–1.10)
GFR calc Af Amer: 59 mL/min — ABNORMAL LOW (ref >90)
Potassium: 3.5 mEq/L (ref 3.5–5.1)
Sodium: 137 mEq/L (ref 135–145)

## 2011-10-24 LAB — GLUCOSE, CAPILLARY
Glucose-Capillary: 100 mg/dL — ABNORMAL HIGH (ref 70–99)
Glucose-Capillary: 92 mg/dL (ref 70–99)

## 2011-10-24 LAB — CBC
MCV: 89.3 fL (ref 78.0–100.0)
Platelets: 233 10*3/uL (ref 150–400)
RBC: 2.7 MIL/uL — ABNORMAL LOW (ref 3.87–5.11)
WBC: 10.4 10*3/uL (ref 4.0–10.5)

## 2011-10-24 NOTE — Progress Notes (Signed)
Physical Therapy Treatment Patient Details Name: Mary [name] MRN: [id number] DOB: April 30, 1945 Today's Date: 10/24/2011 Time: 1324-4010 PT Time Calculation (min): 45 min  PT Assessment / Plan / Recommendation Comments on Treatment Session  Pt is not safe for d/c.  Pt required +2 assistance and multiple attempts on transfers.  Pt requires assistance for balance during ambulation and has a tendency to lose balance and pull on walker. Pt requires maximal cues for safety and proper technique.  Pt has not progressed and became emotional about going home.  Pt states that 64 y.o. daughter will have to be there to help her around the house for 24/7.  Daughter has two year old and unsure of consistency of help at home.  Plan for next session is to review all transfers, ambulation, and stair negotiation with family to increase safety and understanding home safety.  If daughter unable to commit to 24/7 care at home the pt will require more intensive rehab before d/c home.    Follow Up Recommendations  Other (comment) (questionable (A) home due to pt concern, need +2 (A) )     Does the patient have the potential to tolerate intense rehabilitation     Barriers to Discharge        Equipment Recommendations  3 in 1 bedside comode;Tub/shower bench    Recommendations for Other Services Other (comment) (None)  Frequency 7X/week   Plan Discharge plan remains appropriate;Frequency remains appropriate    Precautions / Restrictions Precautions Precautions: Knee Restrictions Weight Bearing Restrictions: Yes RLE Weight Bearing: Weight bearing as tolerated   Pertinent Vitals/Pain Pain in R LE did not rate and became emotional during session.    Mobility  Bed Mobility Bed Mobility: Supine to Sit;Sitting - Scoot to Edge of Bed Supine to Sit: 1: +2 Total assist;HOB flat Supine to Sit: Patient Percentage: 80% Sitting - Scoot to Edge of Bed: 3: Mod assist Details for Bed Mobility Assistance: +2 for  safety.  Pt required (A) for supporting and moving LE OOB and required (A) with UE to help pull self upright.  Max cues for proper hand positioning, proper technique, and safety.   Transfers Transfers: Sit to Stand;Stand to Sit Sit to Stand: 1: +2 Total assist;With upper extremity assist;From bed;From chair/3-in-1 Sit to Stand: Patient Percentage: 70% Stand to Sit: 1: +2 Total assist;To bed Stand to Sit: Patient Percentage: 90% Details for Transfer Assistance: +2 for safety.  Pt require multiple attempts in performing sit to stand from bed and chair.  Pt require (A) initiating mvt and holding walker due to unsafety pulling on walker to stand.  +2 (A) for stand and sit due to impulsive behavior to sit before walker and pt in safe position to transfer.  Max cues for hand placement, proper technique, safety awareness, and foot placement. Ambulation/Gait Ambulation/Gait Assistance: 1: +2 Total assist Ambulation/Gait: Patient Percentage: 90% Ambulation Distance (Feet): 10 Feet Assistive device: Rolling walker Ambulation/Gait Assistance Details: +2 for safety.  (A) with RW placement and balance.  Pt's knee buckled 2x during ambulation.  Required max cues for proper technique, safety, and RW placement. Gait Pattern: Step-to pattern;Decreased stride length;Antalgic;Wide base of support Gait velocity: decreased Stairs: Yes Stairs Assistance: 1: +2 Total assist Stairs Assistance Details (indicate cue type and reason): +2 for safety.  Pt required (A) for balance and RW placement. Max cues for proper technique and safety. Stair Management Technique: No rails;Backwards;With walker Number of Stairs: 1  Wheelchair Mobility Wheelchair Mobility: No    Exercises  PT Diagnosis:    PT Problem List:   PT Treatment Interventions:     PT Goals Acute Rehab PT Goals PT Goal Formulation: With patient Time For Goal Achievement: 10/28/11 Potential to Achieve Goals: Good Pt will go Supine/Side to Sit: with  supervision;with HOB 0 degrees PT Goal: Supine/Side to Sit - Progress: Not met Pt will go Sit to Supine/Side: with supervision;with HOB 0 degrees PT Goal: Sit to Supine/Side - Progress: Not met Pt will go Sit to Stand: with supervision PT Goal: Sit to Stand - Progress: Not progressing Pt will go Stand to Sit: with supervision PT Goal: Stand to Sit - Progress: Not progressing Pt will Ambulate: 51 - 150 feet;with supervision;with rolling walker PT Goal: Ambulate - Progress: Not met Pt will Go Up / Down Stairs: 1-2 stairs;with supervision;with rolling walker PT Goal: Up/Down Stairs - Progress: Not met  Visit Information  Last PT Received On: 10/24/11 Assistance Needed: +2    Subjective Data  Subjective: I am worried about going home Patient Stated Goal: To go home safe   Cognition  Overall Cognitive Status: Appears within functional limits for tasks assessed/performed Arousal/Alertness: Awake/alert Orientation Level: Appears intact for tasks assessed Behavior During Session: [facility] for tasks performed    Balance  Balance Balance Assessed: No  End of Session PT - End of Session Equipment Utilized During Treatment: Gait belt Activity Tolerance: Patient limited by fatigue;Patient limited by pain Patient left: in chair;with call bell/phone within reach Nurse Communication: Mobility status   GP     Jailey Booton 10/24/2011, 11:04 AM

## 2011-10-24 NOTE — Progress Notes (Signed)
Physical Therapy Progress Note   10/24/11 1300  PT Visit Information  Last PT Received On 10/24/11  Assistance Needed +2  PT Time Calculation  PT Start Time 1220  PT Stop Time 1308  PT Time Calculation (min) 48 min  Subjective Data  Subjective I do not think I am ready to go home.  Patient Stated Goal To go home only if safe  Precautions  Precautions Knee  Restrictions  Weight Bearing Restrictions Yes  RLE Weight Bearing WBAT  Cognition  Overall Cognitive Status Appears within functional limits for tasks assessed/performed  Arousal/Alertness Awake/alert  Orientation Level Appears intact for tasks assessed  Behavior During Session [facility] for tasks performed  Bed Mobility  Bed Mobility Supine to Sit;Sitting - Scoot to Edge of Bed;Sit to Supine  Supine to Sit 1: +2 Total assist;HOB flat  Supine to Sit: Patient Percentage 80%  Sitting - Scoot to Edge of Bed 3: Mod assist  Sit to Supine HOB flat;1: +2 Total assist  Sit to Supine: Patient Percentage 80%  Details for Bed Mobility Assistance +2 for safety.  Had family present to learn how to efficiently and effectively help pt to sit up in bed.  Daughter assisted with LE OOB while physical therapist helped with upper body to upright position.  When returning to supine position therapist helped lift LE into bed and helped support upper body for positioning.  Transfers  Transfers Sit to Stand;Stand to Sit  Sit to Stand 1: +2 Total assist;With upper extremity assist;From bed;From chair/3-in-1  Sit to Stand: Patient Percentage 70%  Stand to Sit 1: +2 Total assist;With upper extremity assist;To chair/3-in-1;To bed  Stand to Sit: Patient Percentage 90%  Stand Pivot Transfers 1: +2 Total assist  Stand Pivot Transfers: Patient Percentage 80%  Details for Transfer Assistance +2 for safety.  Pt tried multiple attempts with daughter to rise OOB.  Bed at home is very low to ground so hospital bed was set to lowest height.  Daughter unable to assist  in transfer and two therapists then assisted in transfer from sit to stand.  During stand to sit pt required max cues for RW positioning and safety awareness.    Ambulation/Gait  Ambulation/Gait Assistance 1: +2 Total assist  Ambulation/Gait: Patient Percentage 90%  Ambulation Distance (Feet) 5 Feet  Assistive device Rolling walker  Ambulation/Gait Assistance Details +2 for safety.  Pt ambulated to stairs in the ortho gym.  Pt required cueing for RW placement and (A) for balance.  Gait Pattern Step-to pattern;Decreased stride length;Antalgic;Wide base of support  Gait velocity decreased  Stairs Yes  Stairs Assistance 1: +2 Total assist  Stairs Assistance Details (indicate cue type and reason) +2 for safety. Pt has two steps entering into the house.  Daughter participated in stair negotiation and (A) by holding onto walker while pt negotiated first step.  During second step pt had difficulty completing the second step.  During an attempt pt legs became weak and require a slow controlled decent to stair to sit and rest.  After a rest break pt required +3 (A) to stand up and pivot to chair for break.  Stair Management Technique No rails;Backwards;With walker  Number of Stairs 1   Wheelchair Mobility  Wheelchair Mobility No  Balance  Balance Assessed No  PT - End of Session  Equipment Utilized During Treatment Gait belt  Activity Tolerance Patient limited by fatigue;Patient limited by pain  Patient left in bed;with call bell/phone within reach;with family/visitor present  Nurse Communication Mobility  status  PT - Assessment/Plan  Comments on Treatment Session Pt is not safe for d/c home.  Pt does not own a recliner or other chairs with arm rests to use around the house to be able to transfer safety.  Daughter and pt do not believe that home set up is safe and pt would not be able to utilize the bed due to low height.  Pt unable to rise from low level hospital bed with family support and unable to  negotiate two stairs safety.  Pt requires more intensive rehab before d/c home.  PT Plan Discharge plan remains appropriate;Frequency remains appropriate  PT Frequency 7X/week  Recommendations for Other Services Other (comment) (None)  Follow Up Recommendations Post acute inpatient  Does the patient have the potential to tolerate intense rehabilitation? No, Recommend SNF  Equipment Recommended 3 in 1 bedside comode;Tub/shower bench  Acute Rehab PT Goals  PT Goal Formulation With patient  Time For Goal Achievement 10/28/11  Potential to Achieve Goals Fair  Pt will go Supine/Side to Sit with supervision;with HOB 0 degrees  PT Goal: Supine/Side to Sit - Progress Not met  Pt will go Sit to Supine/Side with supervision;with HOB 0 degrees  PT Goal: Sit to Supine/Side - Progress Not met  Pt will go Sit to Stand with supervision  PT Goal: Sit to Stand - Progress Not met  Pt will go Stand to Sit with supervision  PT Goal: Stand to Sit - Progress Not met  Pt will Transfer Bed to Chair/Chair to Bed with supervision  PT Transfer Goal: Bed to Chair/Chair to Bed - Progress Not met  Pt will Ambulate 51 - 150 feet;with supervision;with rolling walker  PT Goal: Ambulate - Progress Not progressing  Pt will Go Up / Down Stairs 1-2 stairs;with supervision;with rolling walker  PT Goal: Up/Down Stairs - Progress Not met   Pain in R LE did not rate.  Amy DiTommaso, SPT  [place], [place] DPT [phone]

## 2011-10-24 NOTE — Progress Notes (Signed)
  [name], MD   Altamese Cabal, PA-C [address] [place], [place], [place]  [address]                             [phone]   PROGRESS NOTE  Subjective:  negative for Chest Pain  negative for Shortness of Breath  negative for Nausea/Vomiting   negative for Calf Pain  negative for Bowel Movement   Tolerating Diet: yes         Patient reports pain as 4 on 0-10 scale.    Objective: Vital signs in last 24 hours:   Patient Vitals for the past 24 hrs:  BP Temp Temp src Pulse Resp SpO2 Height Weight  10/24/11 1054 - - - - - - 5\' 5"  (1.651 m) 144.244 kg (318 lb)  10/24/11 0937 116/40 mmHg - - - - - - -  10/24/11 0534 108/56 mmHg 98.6 F (37 C) Oral 103  19  97 % - -  10/23/11 2155 102/47 mmHg 99.2 F (37.3 C) Oral 105  18  99 % - -  10/23/11 2040 - - - - - - 5\' 5"  (1.651 m) -  10/23/11 2000 - - - - 18  99 % - -  10/23/11 1348 104/61 mmHg 98.4 F (36.9 C) - 92  16  100 % - -    @flow {1959:LAST@   Intake/Output from previous day:   10/09 0701 - 10/10 0700 In: 1440 [P.O.:840; I.V.:600] Out: -    Intake/Output this shift:   10/10 0701 - 10/10 1900 In: -  Out: 275 [Urine:275]   Intake/Output      10/09 0701 - 10/10 0700 10/10 0701 - 10/11 0700   P.O. 840    I.V. (mL/kg) 600    Total Intake(mL/kg) 1440    Urine (mL/kg/hr)  275 (0.3)   Total Output  275   Net +1440 -275        Urine Occurrence 10 x       LABORATORY DATA:  Basename 10/24/11 0500 10/23/11 0700 10/22/11 0520  WBC 10.4 11.5* 10.0  HGB 8.3* 9.6* 9.8*  HCT 24.1* 27.8* 28.4*  PLT 233 258 245    Basename 10/24/11 0500 10/23/11 0700 10/22/11 0520  NA 137 135 136  K 3.5 3.7 3.8  CL 104 101 102  CO2 24 26 28   BUN 24* 19 23  CREATININE 1.13* 1.04 1.21*  GLUCOSE 97 112* 108*  CALCIUM 9.2 9.0 9.0   Lab Results  Component Value Date   INR 0.97 10/11/2011    Examination:  General appearance: alert, cooperative and no distress Extremities: Homans sign is negative, no sign of DVT  Wound  Exam: clean, dry, intact   Drainage:  None: wound tissue dry  Motor Exam: EHL and FHL Intact  Sensory Exam: Deep Peroneal normal  Vascular Exam:    Assessment:    3 Days Post-Op  Procedure(s) (LRB): TOTAL KNEE ARTHROPLASTY (Right)  ADDITIONAL DIAGNOSIS:  Active Problems:  * No active hospital problems. *   Acute Blood Loss Anemia   Plan: Physical Therapy as ordered Weight Bearing as Tolerated (WBAT)  DVT Prophylaxis:  Lovenox  DISCHARGE PLAN: Home  DISCHARGE NEEDS: HHPT, CPM, Walker and 3-in-1 comode seat         Shoshana Johal 10/24/2011, 12:41 PM

## 2011-10-24 NOTE — Progress Notes (Signed)
Clinical Social Work Department BRIEF PSYCHOSOCIAL ASSESSMENT 10/24/2011  Patient:  Mary [name], Mary [name]     Account Number:  [id number]     Admit date:  10/21/2011  Clinical Social Worker:  Dennison Bulla  Date/Time:  10/24/2011 04:00 PM  Referred by:  Physician  Date Referred:  10/24/2011 Referred for  SNF Placement   Other Referral:   Interview type:  Patient Other interview type:    PSYCHOSOCIAL DATA Living Status:  ALONE Admitted from facility:   Level of care:   Primary support name:  Darlene Primary support relationship to patient:  CHILD, ADULT Degree of support available:   Adequate    CURRENT CONCERNS Current Concerns  Post-Acute Placement   Other Concerns:    SOCIAL WORK ASSESSMENT / PLAN CSW received referral due to patient not progressing well and needing SNF placement. CSW reviewed chart and met with patient at dc. No visitors present.    CSW introduced myself and explained role. Patient remember speaking with CSW and reports that she has decided that she needs SNF. Patient reports that she prefers [facility]. CSW explained SNF process and finding a facility that is in network with her insurance. Patient agreeable to plan and has SNF list.    CSW completed FL2 and submitted for pasarr. CSW faxed out to [facility] and will follow up with bed offers.   Assessment/plan status:  Psychosocial Support/Ongoing Assessment of Needs Other assessment/ plan:   Information/referral to community resources:   SNF list    PATIENT'S/FAMILY'S RESPONSE TO PLAN OF CARE: Patient alert and oriented. Patient agreeable to ST SNF. Patient engaged throughout assessment.

## 2011-10-24 NOTE — Progress Notes (Addendum)
Clinical Social Work Department CLINICAL SOCIAL WORK PLACEMENT NOTE 10/24/2011  Patient:  SIGNA, CHEEK  Account Number:  [id number] Admit date:  10/21/2011  Clinical Social Worker:  Unk Lightning, LCSW  Date/time:  10/24/2011 04:00 PM  Clinical Social Work is seeking post-discharge placement for this patient at the following level of care:   SKILLED NURSING   (*CSW will update this form in Epic as items are completed)   10/24/2011  Patient/family provided with [facility] Department of Clinical Social Work's list of facilities offering this level of care within the geographic area requested by the patient (or if unable, by the patient's family).  10/24/2011  Patient/family informed of their freedom to choose among providers that offer the needed level of care, that participate in Medicare, Medicaid or managed care program needed by the patient, have an available bed and are willing to accept the patient.  10/24/2011  Patient/family informed of [facility]' ownership interest in [facility], as well as of the fact that they are under no obligation to receive care at this facility.  PASARR submitted to EDS on 10/24/2011 PASARR number received from EDS on 10/24/2011  FL2 transmitted to all facilities in geographic area requested by pt/family on  10/24/2011 FL2 transmitted to all facilities within larger geographic area on   Patient informed that his/her managed care company has contracts with or will negotiate with  certain facilities, including the following:     Patient/family informed of bed offers received:   Patient chooses bed at  Physician recommends and patient chooses bed at    Patient to be transferred to  on   Patient to be transferred to facility by   The following physician request were entered in Epic:   Additional Comments: 10/25/11-- Patient was worked up for placement at [facility] and we were awaiting [employer] authorization. Patient notified  nursing that she has changed her mind again and has decided to return home with [facility] and help by her family.  Notified SNF and Dr. Sherlean Foot. RNCM is aware and arranged for [facility] folllow up.  CSW signing off.    Lorri Frederick. [place]  [phone]

## 2011-10-24 NOTE — Progress Notes (Signed)
Agree with PT treatment note.  Diontay Rosencrans, PT DPT [phone]  

## 2011-10-25 ENCOUNTER — Encounter (HOSPITAL_COMMUNITY): Payer: Self-pay | Admitting: Orthopedic Surgery

## 2011-10-25 LAB — BASIC METABOLIC PANEL
CO2: 25 mEq/L (ref 19–32)
Calcium: 9.2 mg/dL (ref 8.4–10.5)
Creatinine, Ser: 1.23 mg/dL — ABNORMAL HIGH (ref 0.50–1.10)
GFR calc non Af Amer: 46 mL/min — ABNORMAL LOW (ref >90)
Glucose, Bld: 78 mg/dL (ref 70–99)
Sodium: 138 mEq/L (ref 135–145)

## 2011-10-25 LAB — GLUCOSE, CAPILLARY
Glucose-Capillary: 104 mg/dL — ABNORMAL HIGH (ref 70–99)
Glucose-Capillary: 99 mg/dL (ref 70–99)

## 2011-10-25 NOTE — Progress Notes (Signed)
Agree with PT treatment note.  Ira Dougher, PT DPT [phone]  

## 2011-10-25 NOTE — Progress Notes (Signed)
I want to go home. Pt requesting to go home and MD already wrote orders she  Does not want to go to the SNF Case manger/Social worker made aware

## 2011-10-25 NOTE — Progress Notes (Signed)
Physical Therapy Progress Note   10/25/11 1500  PT Visit Information  Last PT Received On 10/25/11  Assistance Needed +2  PT Time Calculation  PT Start Time 0205  PT Stop Time 0245  PT Time Calculation (min) 40 min  Subjective Data  Subjective I was trying to take a nap but that doesn't happen often around here.  Patient Stated Goal To get better  Precautions  Precautions Knee  Restrictions  Weight Bearing Restrictions Yes  RLE Weight Bearing WBAT  Cognition  Overall Cognitive Status Appears within functional limits for tasks assessed/performed  Arousal/Alertness Awake/alert  Orientation Level Appears intact for tasks assessed  Behavior During Session [facility] for tasks performed  Bed Mobility  Bed Mobility Supine to Sit;Sitting - Scoot to [place] of Bed;Sit to Supine  Supine to Sit 4: Min guard;HOB flat  Sitting - Scoot to [place] of Bed 4: Min guard  Sit to Supine 4: Min assist;HOB flat  Details for Bed Mobility Assistance (A) supporting R LE into bed.  Max cues for proper technique, hand placement, and safety awareness.  Transfers  Transfers Sit to Stand;Stand to Sit  Sit to Stand 1: +2 Total assist;With upper extremity assist;From bed;From toilet  Sit to Stand: Patient Percentage 90%  Stand to Sit 1: +2 Total assist;To bed;To toilet  Stand to Sit: Patient Percentage 90%  Details for Transfer Assistance +2 for safety.  Pt has improved transfers and has required less assistance.  Max cues for momentum, hand placement, RW placement, and safety awareness.  Ambulation/Gait  Ambulation/Gait Assistance 1: +2 Total assist  Ambulation Distance (Feet) 90 Feet  Assistive device Rolling walker  Ambulation/Gait Assistance Details +2 for safety.  Pt able to increase ambulation and decrease rest breaks. (A) for balance and cues for posture as fatigue increased, safety awareness, and RW placement.  Gait Pattern Step-to pattern;Decreased stride length;Antalgic;Wide base of support  Gait velocity  decreased  Stairs No  Wheelchair Mobility  Wheelchair Mobility No  Balance  Balance Assessed No  PT - End of Session  Equipment Utilized During Treatment Gait belt  Activity Tolerance Patient limited by fatigue;Patient limited by pain  Patient left in bed;with call bell/phone within reach;in CPM  Nurse Communication Mobility status  PT - Assessment/Plan  Comments on Treatment Session Pt has progressed since last session but still requires +2 for safety.  Pt had difficulty ambulating backwards to chair or comode. Pt has increased ambulation distance but still has decreased safety awareness.  When asked about home equipment daughter still inconsistent.  Pt requires SNF placement prior to d/c to increase endurance, safety, and balance.  PT Plan Discharge plan remains appropriate;Frequency remains appropriate  PT Frequency 7X/week  Recommendations for Other Services Other (comment) (None)  Follow Up Recommendations Post acute inpatient  Does the patient have the potential to tolerate intense rehabilitation? No, Recommend SNF  Equipment Recommended Rolling walker with 5" wheels;3 in 1 bedside comode  Acute Rehab PT Goals  PT Goal Formulation With patient  Time For Goal Achievement 10/28/11  Potential to Achieve Goals Fair  Pt will go Supine/Side to Sit with supervision;with HOB 0 degrees  PT Goal: Supine/Side to Sit - Progress Progressing toward goal  Pt will go Sit to Supine/Side with supervision;with HOB 0 degrees  PT Goal: Sit to Supine/Side - Progress Progressing toward goal  Pt will go Sit to Stand with supervision  PT Goal: Sit to Stand - Progress Progressing toward goal  Pt will go Stand to Sit with supervision  PT Goal: Stand to Sit - Progress Progressing toward goal  Pt will Ambulate 51 - 150 feet;with supervision;with rolling walker  PT Goal: Ambulate - Progress Partly met    Pain in R LE 6/10.  Jcion Buddenhagen, SPT

## 2011-10-25 NOTE — Progress Notes (Signed)
Occupational Therapy Treatment Patient Details Name: Mary [name] MRN: [id number] DOB: Feb 25, 1945 Today's Date: 10/25/2011 Time: 0865-7846 OT Time Calculation (min): 53 min  OT Assessment / Plan / Recommendation Comments on Treatment Session Pt. stated she is not comfortable with going home at this level and would prefer to be d/c to a SNF in order to be more confident with going home. Pt. is progressing but would benefit from further rehab prior to returning home. Pt. is +2 (A) during transfers and ambulation for safety due to weakness in bilateral UE/LE.    Follow Up Recommendations  Supervision/Assistance - 24 hour;Skilled nursing facility    Barriers to Discharge       Equipment Recommendations  3 in 1 bedside comode;Tub/shower bench    Recommendations for Other Services    Frequency Min 2X/week   Plan Discharge plan remains appropriate    Precautions / Restrictions Precautions Precautions: Knee Restrictions Weight Bearing Restrictions: Yes RLE Weight Bearing: Weight bearing as tolerated   Pertinent Vitals/Pain None reported    ADL  Grooming: Teeth care;Wash/dry hands;Min assist Where Assessed - Grooming: Supported standing Upper Body Bathing: Performed;Supervision/safety Where Assessed - Upper Body Bathing: Supported sitting Lower Body Bathing: Performed;Maximal assistance Where Assessed - Lower Body Bathing: Supported sit to stand Upper Body Dressing: Performed;Min guard Where Assessed - Upper Body Dressing: Supported sitting Toilet Transfer: Performed;min Engineer, manufacturing: Patient Percentage:  Toilet Transfer Method: Sit to Barista: Regular height toilet Toileting - Clothing Manipulation and Hygiene: Performed;Min guard Where Assessed - Engineer, mining and Hygiene: Sit to stand from 3-in-1 or toilet Equipment Used: Gait belt;Rolling walker Transfers/Ambulation Related to ADLs: Pt. +2 total (A) for transfers.  Requires cues for momentum and to get nose over toes. Pt. also requires cues for safe hand placement during transfers and correct positioning of RW. Pt. Shows deficits with walking backwards stating her left knee will buckle on her. Pt. Very anxious with walking backwards and stepping onto step. ADL Comments: Pt. brushed teeth standing at sink level with min assist. Pt. requested to sit down after brushing teeth due to fatigue. Pt. completed UB and LB bathing seated at sink level. Pt. was max (A) for bathing backside due to inability to reach.     OT Diagnosis:    OT Problem List:   OT Treatment Interventions:     OT Goals Acute Rehab OT Goals OT Goal Formulation: With patient Time For Goal Achievement: 11/05/11 Potential to Achieve Goals: Good ADL Goals Pt Will Perform Grooming: with supervision;Standing at sink ADL Goal: Grooming - Progress: Progressing toward goals Pt Will Perform Lower Body Dressing: with min assist;Sit to stand from chair;with adaptive equipment ADL Goal: Lower Body Dressing - Progress: Progressing toward goals Pt Will Transfer to Toilet: with min assist;Ambulation;3-in-1 ADL Goal: Toilet Transfer - Progress: Progressing toward goals Pt Will Perform Toileting - Hygiene: with supervision;Sitting on 3-in-1 or toilet ADL Goal: Toileting - Hygiene - Progress: Progressing toward goals Pt Will Perform Tub/Shower Transfer: Tub transfer;with min assist;Ambulation;Transfer tub bench  Visit Information  Last OT Received On: 10/25/11 Assistance Needed: +2 PT/OT Co-Evaluation/Treatment: Yes    Subjective Data      Prior Functioning       Cognition  Overall Cognitive Status: Appears within functional limits for tasks assessed/performed Arousal/Alertness: Awake/alert Orientation Level: Appears intact for tasks assessed Behavior During Session: [facility] for tasks performed    Mobility  Shoulder Instructions Bed Mobility Bed Mobility: Rolling Right;Supine to Sit;Sitting -  Scoot to [place]  of Bed Rolling Right: 4: Min guard Supine to Sit: 4: Min guard;HOB flat Supine to Sit: Patient Percentage: 90% Sitting - Scoot to Edge of Bed: 4: Min guard Sit to Supine: HOB flat;1: +2 Total assist Sit to Supine: Patient Percentage: 80% Scooting to HOB: 1: +2 Total assist Scooting to [facility]: Patient Percentage: 80% Details for Bed Mobility Assistance: vc's for correct technique  Transfers Transfers: Sit to Stand;Stand to Sit Sit to Stand: 1: +2 Total assist;With upper extremity assist;From bed;From chair/3-in-1 Sit to Stand: Patient Percentage: 90% Stand to Sit: 1: +2 Total assist;With upper extremity assist;To chair/3-in-1;To bed Stand to Sit: Patient Percentage: 90% Details for Transfer Assistance: +2 for safety. Pt. had difficulty standing up, took 3 attempts to stand up from hospital bed. Pt. required verbal cues for hand and LE placement.        Exercises      Balance Balance Balance Assessed: No   End of Session CPM Right Knee CPM Right Knee: Off  GO     Cleora Fleet 10/25/2011, 10:30 AM

## 2011-10-25 NOTE — Discharge Summary (Signed)
Mary Spurling, MD   Altamese Cabal, PA-C [address] [place], [place], [place]  [address]                             [phone]  PATIENT ID: Mary [name]        MRN:  [id number]          DOB/AGE: [birth year] / 66 y.o.    DISCHARGE SUMMARY  ADMISSION DATE:    10/21/2011 DISCHARGE DATE:   10/25/2011   ADMISSION DIAGNOSIS: osteoarthritis right knee    DISCHARGE DIAGNOSIS:  osteoarthritis right knee    ADDITIONAL DIAGNOSIS: Active Problems:  * No active hospital problems. *   Past Medical History  Diagnosis Date  . Hypertension   . Hyperlipemia   . Arthritis     osteoarthritis  . Diabetes mellitus     Insulin dependent    PROCEDURE: Procedure(s): TOTAL KNEE ARTHROPLASTY on 10/21/2011  CONSULTS:     HISTORY:  See H&P in chart  HOSPITAL COURSE:  Mary [name] is a 66 y.o. admitted on 10/21/2011 and found to have a diagnosis of osteoarthritis right knee.  After appropriate laboratory studies were obtained  they were taken to the operating room on 10/21/2011 and underwent Procedure(s): TOTAL KNEE ARTHROPLASTY.   They were given perioperative antibiotics:  Anti-infectives     Start     Dose/Rate Route Frequency Ordered Stop   10/21/11 1400   ceFAZolin (ANCEF) IVPB 2 g/50 mL premix        2 g 100 mL/hr over 30 Minutes Intravenous Every 6 hours 10/21/11 1128 10/21/11 2237   10/20/11 1409   ceFAZolin (ANCEF) 3 g in dextrose 5 % 50 mL IVPB  Status:  Discontinued        3 g 160 mL/hr over 30 Minutes Intravenous 60 min pre-op 10/20/11 1409 10/21/11 1105        .  Tolerated the procedure well.  Placed with a foley intraoperatively.  Given Ofirmev at induction and for 48 hours.    POD #1, allowed out of bed to a chair.  PT for ambulation and exercise program.  Foley D/C'd in morning.  IV saline locked.  O2 discontionued.  POD #2, continued PT and ambulation.   Hemovac pulled. . Patient struggling with transfers  The remainder of the hospital course was dedicated to  ambulation and strengthening.   The patient was discharged on 4 Days Post-Op in  Good condition.  Blood products given:none  DIAGNOSTIC STUDIES: Recent vital signs: Patient Vitals for the past 24 hrs:  BP Temp Pulse Resp SpO2 Height Weight  10/25/11 0600 109/53 mmHg 98.8 F (37.1 C) 96  18  96 % - -  10/25/11 0000 - - - 18  95 % - -  10/24/11 2126 119/48 mmHg 99.9 F (37.7 C) 106  16  97 % - -  10/24/11 2000 - - - 18  98 % - -  10/24/11 1400 98/42 mmHg 98.6 F (37 C) 89  16  97 % - -  10/24/11 1054 - - - - - 5\' 5"  (1.651 m) 144.244 kg (318 lb)  10/24/11 0937 116/40 mmHg - - - - - -       Recent laboratory studies:  Basename 10/24/11 0500 17-Nov-2011 0700 10/22/11 0520  WBC 10.4 11.5* 10.0  HGB 8.3* 9.6* 9.8*  HCT 24.1* 27.8* 28.4*  PLT 233 258 245    Basename 10/25/11 0630 10/24/11  0500 10/23/11 0700 10/22/11 0520  NA 138 137 135 136  K 3.8 3.5 3.7 3.8  CL 102 104 101 102  CO2 25 24 26 28   BUN 28* 24* 19 23  CREATININE 1.23* 1.13* 1.04 1.21*  GLUCOSE 78 97 112* 108*  CALCIUM 9.2 9.2 9.0 9.0   Lab Results  Component Value Date   INR 0.97 10/11/2011     Recent Radiographic Studies :  Dg Chest 2 View  10/11/2011  *RADIOLOGY REPORT*  Clinical Data: Preop right total knee replacement.  Hypertension, smoker.  CHEST - 2 VIEW  Comparison: None.  Findings: Heart and mediastinal contours are within normal limits. No focal opacities or effusions.  No acute bony abnormality.  IMPRESSION: No active cardiopulmonary disease.   Original Report Authenticated By: Cyndie Chime, M.D.     DISCHARGE INSTRUCTIONS: Discharge Orders    Future Orders Please Complete By Expires   Diet - low sodium heart healthy      Call MD / Call 911      Comments:   If you experience chest pain or shortness of breath, CALL 911 and be transported to the hospital emergency room.  If you develope a fever above 101 F, pus (white drainage) or increased drainage or redness at the wound, or calf pain, call your  surgeon's office.   Constipation Prevention      Comments:   Drink plenty of fluids.  Prune juice may be helpful.  You may use a stool softener, such as Colace (over the counter) 100 mg twice a day.  Use MiraLax (over the counter) for constipation as needed.   Increase activity slowly as tolerated      Driving restrictions      Comments:   No driving for 6 weeks   Lifting restrictions      Comments:   No lifting for 6 weeks   CPM      Comments:   Continuous passive motion machine (CPM):      Use the CPM from 0 to 90 for 6-8 hours per day.      You may increase by 10 per day.  You may break it up into 2 or 3 sessions per day.      Use CPM for 2 weeks or until you are told to stop.   TED hose      Comments:   Use stockings (TED hose) for 3 weeks on both leg(s).  You may remove them at night for sleeping.   Change dressing      Comments:   Change dressing on Saturday, then change the dressing daily with sterile 4 x 4 inch gauze dressing and apply TED hose.  You may clean the incision with alcohol prior to redressing.   Do not put a pillow under the knee. Place it under the heel.         DISCHARGE MEDICATIONS:     Medication List     As of 10/25/2011  8:36 AM    STOP taking these medications         HYDROcodone-acetaminophen 5-500 MG per tablet   Commonly known as: VICODIN      TAKE these medications         aspirin 81 MG tablet   Take 81 mg by mouth daily.      celecoxib 200 MG capsule   Commonly known as: CELEBREX   Take 1 capsule (200 mg total) by mouth every 12 (twelve) hours.  enoxaparin 40 MG/0.4ML injection   Commonly known as: LOVENOX   Inject 0.4 mLs (40 mg total) into the skin daily.      insulin detemir 100 UNIT/ML injection   Commonly known as: LEVEMIR   Inject 38 Units into the skin at bedtime.      methocarbamol 500 MG tablet   Commonly known as: ROBAXIN   Take 1 tablet (500 mg total) by mouth every 6 (six) hours as needed.      oxyCODONE 5 MG  immediate release tablet   Commonly known as: Oxy IR/ROXICODONE   Take 1-2 tablets (5-10 mg total) by mouth every 3 (three) hours as needed.      oxyCODONE 10 MG 12 hr tablet   Commonly known as: OXYCONTIN   Take 1 tablet (10 mg total) by mouth every 12 (twelve) hours.      rosuvastatin 10 MG tablet   Commonly known as: CRESTOR   Take 10 mg by mouth daily.      telmisartan-hydrochlorothiazide 80-25 MG per tablet   Commonly known as: MICARDIS HCT   Take 1 tablet by mouth daily.      VICTOZA 18 MG/3ML Soln   Generic drug: Liraglutide   Inject 1.8 mg into the skin at bedtime.      Vitamin B-12 1000 MCG Subl   Place 1,000 mcg under the tongue daily.        FOLLOW UP VISIT:       Follow-up Information    Follow up with Raymon Mutton, MD. Call on 11/05/2011.   Contact information:   [address] [place] [address] [phone]          DISPOSITION:  Home  CONDITION:  {Good  Ferrin Liebig 10/25/2011, 8:36 AM

## 2011-10-25 NOTE — Progress Notes (Signed)
Pt demonstrates deficits walking backward with RW. This indicates high fall risk. The only way to access the tub level with bench would be to back up with RW per patient into small space bathroom. REcommend SNf for d/c planning to incr activity tolerance, incr mobility, incr bed mobility , incr independence with sit<>stand, decr fall risk and promote independence using AE.  I agree with the following treatment note after reviewing documentation.   Harrel Carina [place]   OTR/L Pager: [phone] Office: [phone] .

## 2011-10-25 NOTE — Progress Notes (Signed)
Physical Therapy Treatment Patient Details Name: Mary [name] MRN: [id number] DOB: [birth year] Today's Date: 10/25/2011 Time: 4098-1191 PT Time Calculation (min): 50 min  PT Assessment / Plan / Recommendation Comments on Treatment Session  Pt has progressed since last session but is still not safe for d/c home.  Pt has stated wanting to go to snf due to fear of not being safe at home. Daughter has been inconsistent with providing information about equipment and home environment.  Pt still required multiple attempts to rise from low height hospital bed and requires +2 (A) for safety with transfers and mobility.  Pt has low endurance and weakness in the bilateral UE/LE and would benefit from post rehabilitation at snf prior to d/c home.    Follow Up Recommendations  Post acute inpatient     Does the patient have the potential to tolerate intense rehabilitation  No, Recommend SNF  Barriers to Discharge  Questionable home environment and safety to enter home.  Pt provided a picture of stairs due to difficulty explaining stair set up.  Pt has step stool to enter larger step into house and question durability of step for overall safety into home.  In previous session pt needed to be lowered to step due to bilateral LE weakness/endurance after multiple attempts to complete stair negotiation.       Equipment Recommendations  3 in 1 bedside comode;Rolling walker with 5" wheels    Recommendations for Other Services Other (comment) (None)  Frequency 7X/week   Plan Discharge plan remains appropriate;Frequency remains appropriate    Precautions / Restrictions Precautions Precautions: Knee Restrictions Weight Bearing Restrictions: Yes RLE Weight Bearing: Weight bearing as tolerated   Pertinent Vitals/Pain 7/10 R LE    Mobility  Bed Mobility Bed Mobility: Rolling Right;Supine to Sit;Sitting - Scoot to [place] of Bed Rolling Right: 4: Min guard Supine to Sit: 4: Min guard;HOB flat Supine to Sit:  Patient Percentage: 90% Sitting - Scoot to Edge of Bed: 4: Min guard Details for Bed Mobility Assistance: Cues for proper technique and UE/LE placement. Transfers Transfers: Sit to Stand;Stand to Sit Sit to Stand: 1: +2 Total assist;With upper extremity assist;From bed;From chair/3-in-1 Sit to Stand: Patient Percentage: 90% Stand to Sit: 1: +2 Total assist;With upper extremity assist;To chair/3-in-1;To bed Stand to Sit: Patient Percentage: 90% Details for Transfer Assistance: +2 for safety.  (A) with initiating mvt and required multiple attempts to rise from low hospital bed.  (A) with holding RW in place and balance.  Max cues for momentum, hand placement, foot placement, and safety awareness. Ambulation/Gait Ambulation/Gait Assistance: 1: +2 Total assist Ambulation/Gait: Patient Percentage: 90% Ambulation Distance (Feet): 60 Feet (with multiple sit/stand rest breaks) Assistive device: Rolling walker Ambulation/Gait Assistance Details: +2 for safety.  (A) with initial balance and RW placement.  Max cues for RW positioning, hand placement, foot placement, posture, and safety awareness. Gait Pattern: Step-to pattern;Decreased stride length;Antalgic;Wide base of support Gait velocity: decreased Stairs: Yes Stairs Assistance: 1: +2 Total assist Stairs Assistance Details (indicate cue type and reason): +2 for safety.  (A) with RW placement, support for RW, and balance.  Max cues for proper technique and safety awareness.  Pt has two steps in home and was educated on performing the first step and then sitting in a chair and having family pull her into the house.  Therefore with low endurance pt would be safer. Stair Management Technique: No rails;Backwards;With walker Number of Stairs: 1  Wheelchair Mobility Wheelchair Mobility: No    Exercises  PT Diagnosis:    PT Problem List:   PT Treatment Interventions:     PT Goals Acute Rehab PT Goals PT Goal Formulation: With patient Time For  Goal Achievement: 10/28/11 Potential to Achieve Goals: Fair Pt will go Supine/Side to Sit: with supervision;with HOB 0 degrees PT Goal: Supine/Side to Sit - Progress: Progressing toward goal Pt will go Sit to Stand: with supervision PT Goal: Sit to Stand - Progress: Not met Pt will go Stand to Sit: with supervision PT Goal: Stand to Sit - Progress: Not met Pt will Ambulate: 51 - 150 feet;with supervision;with rolling walker PT Goal: Ambulate - Progress: Partly met Pt will Go Up / Down Stairs: 1-2 stairs;with supervision;with rolling walker PT Goal: Up/Down Stairs - Progress: Not met  Visit Information  Last PT Received On: 10/25/11 Assistance Needed: +2    Subjective Data  Subjective: I would like to go to a snf because I know that I will keep progressing.  If I go home I may stop progressing. Patient Stated Goal: To go to snf.   Cognition  Overall Cognitive Status: Appears within functional limits for tasks assessed/performed Arousal/Alertness: Awake/alert Orientation Level: Appears intact for tasks assessed Behavior During Session: [facility] for tasks performed    Balance  Balance Balance Assessed: No  End of Session PT - End of Session Equipment Utilized During Treatment: Gait belt Activity Tolerance: Patient limited by fatigue;Patient limited by pain Patient left: in chair;with call bell/phone within reach Nurse Communication: Mobility status CPM Right Knee CPM Right Knee: Off   GP     DITOMMASO, AMY 10/25/2011, 10:18 AM  Jake Shark, PT DPT [phone]

## 2011-10-25 NOTE — Progress Notes (Signed)
Utilization review completed. Dina Mobley, RN, BSN. 

## 2011-10-25 NOTE — Progress Notes (Signed)
  Mary Spurling, MD   Mary Cabal, PA-C [address] [place], [place], [place]  [address]                             [phone]   PROGRESS NOTE  Subjective:  negative for Chest Pain  negative for Shortness of Breath  negative for Nausea/Vomiting   negative for Calf Pain  negative for Bowel Movement   Tolerating Diet: yes         Patient reports pain as 4 on 0-10 scale.    Objective: Vital signs in last 24 hours:   Patient Vitals for the past 24 hrs:  BP Temp Pulse Resp SpO2 Height Weight  10/25/11 0600 109/53 mmHg 98.8 F (37.1 C) 96  18  96 % - -  10/25/11 0000 - - - 18  95 % - -  10/24/11 2126 119/48 mmHg 99.9 F (37.7 C) 106  16  97 % - -  10/24/11 2000 - - - 18  98 % - -  10/24/11 1400 98/42 mmHg 98.6 F (37 C) 89  16  97 % - -  10/24/11 1054 - - - - - 5\' 5"  (1.651 m) 144.244 kg (318 lb)  10/24/11 0937 116/40 mmHg - - - - - -    @flow {1959:LAST@   Intake/Output from previous day:   10/10 0701 - 10/11 0700 In: 480 [P.O.:480] Out: 275 [Urine:275]   Intake/Output this shift:       Intake/Output      10/10 0701 - 10/11 0700 10/11 0701 - 10/12 0700   P.O. 480    I.V. (mL/kg)     Total Intake(mL/kg) 480 (3.3)    Urine (mL/kg/hr) 275 (0.1)    Total Output 275    Net +205         Urine Occurrence 5 x       LABORATORY DATA:  Basename 10/24/11 0500 10/23/11 0700 10/22/11 0520  WBC 10.4 11.5* 10.0  HGB 8.3* 9.6* 9.8*  HCT 24.1* 27.8* 28.4*  PLT 233 258 245    Basename 10/25/11 0630 10/24/11 0500 10/23/11 0700 10/22/11 0520  NA 138 137 135 136  K 3.8 3.5 3.7 3.8  CL 102 104 101 102  CO2 25 24 26 28   BUN 28* 24* 19 23  CREATININE 1.23* 1.13* 1.04 1.21*  GLUCOSE 78 97 112* 108*  CALCIUM 9.2 9.2 9.0 9.0   Lab Results  Component Value Date   INR 0.97 10/11/2011    Examination:  General appearance: alert, cooperative and no distress Extremities: Homans sign is negative, no sign of DVT  Wound Exam: clean, dry, intact   Drainage:  None: wound  tissue dry  Motor Exam: EHL and FHL Intact  Sensory Exam: Deep Peroneal normal  Vascular Exam:    Assessment:    4 Days Post-Op  Procedure(s) (LRB): TOTAL KNEE ARTHROPLASTY (Right)  ADDITIONAL DIAGNOSIS:  Active Problems:  * No active hospital problems. *   Acute Blood Loss Anemia   Plan: Occupational Therapy as ordered Weight Bearing as Tolerated (WBAT)  DVT Prophylaxis:  Lovenox  DISCHARGE PLAN: Home vs snf  DISCHARGE NEEDS: HHPT, CPM, Walker and 3-in-1 comode seat         Mary [name] 10/25/2011, 8:30 AM

## 2011-12-03 ENCOUNTER — Emergency Department (HOSPITAL_COMMUNITY): Payer: PRIVATE HEALTH INSURANCE

## 2011-12-03 ENCOUNTER — Encounter (HOSPITAL_COMMUNITY): Payer: Self-pay

## 2011-12-03 ENCOUNTER — Emergency Department (HOSPITAL_COMMUNITY)
Admission: EM | Admit: 2011-12-03 | Discharge: 2011-12-03 | Disposition: A | Discharge status: Home / Self Care | Payer: PRIVATE HEALTH INSURANCE | Attending: Emergency Medicine | Admitting: Emergency Medicine

## 2011-12-03 DIAGNOSIS — I10 Essential (primary) hypertension: Secondary | ICD-10-CM | POA: Insufficient documentation

## 2011-12-03 DIAGNOSIS — Y929 Unspecified place or not applicable: Secondary | ICD-10-CM | POA: Insufficient documentation

## 2011-12-03 DIAGNOSIS — E785 Hyperlipidemia, unspecified: Secondary | ICD-10-CM | POA: Insufficient documentation

## 2011-12-03 DIAGNOSIS — E119 Type 2 diabetes mellitus without complications: Secondary | ICD-10-CM | POA: Insufficient documentation

## 2011-12-03 DIAGNOSIS — Z9181 History of falling: Secondary | ICD-10-CM | POA: Insufficient documentation

## 2011-12-03 DIAGNOSIS — Y939 Activity, unspecified: Secondary | ICD-10-CM | POA: Insufficient documentation

## 2011-12-03 DIAGNOSIS — M129 Arthropathy, unspecified: Secondary | ICD-10-CM | POA: Insufficient documentation

## 2011-12-03 DIAGNOSIS — S99929A Unspecified injury of unspecified foot, initial encounter: Secondary | ICD-10-CM | POA: Insufficient documentation

## 2011-12-03 DIAGNOSIS — M25561 Pain in right knee: Secondary | ICD-10-CM

## 2011-12-03 DIAGNOSIS — F17200 Nicotine dependence, unspecified, uncomplicated: Secondary | ICD-10-CM | POA: Insufficient documentation

## 2011-12-03 DIAGNOSIS — M25562 Pain in left knee: Secondary | ICD-10-CM

## 2011-12-03 DIAGNOSIS — Z794 Long term (current) use of insulin: Secondary | ICD-10-CM | POA: Insufficient documentation

## 2011-12-03 DIAGNOSIS — R296 Repeated falls: Secondary | ICD-10-CM | POA: Insufficient documentation

## 2011-12-03 DIAGNOSIS — S8990XA Unspecified injury of unspecified lower leg, initial encounter: Secondary | ICD-10-CM | POA: Insufficient documentation

## 2011-12-03 DIAGNOSIS — Z79899 Other long term (current) drug therapy: Secondary | ICD-10-CM | POA: Insufficient documentation

## 2011-12-03 DIAGNOSIS — Z9889 Other specified postprocedural states: Secondary | ICD-10-CM | POA: Insufficient documentation

## 2011-12-03 NOTE — ED Notes (Signed)
Discharge instructions reviewed. Pt verbalized understanding.  

## 2011-12-03 NOTE — ED Notes (Signed)
Report to Holcomb, rn and Tiffany, rn.

## 2011-12-03 NOTE — ED Provider Notes (Signed)
Medical screening examination/treatment/procedure(s) were conducted as a shared visit with non-physician practitioner(s) and myself.  I personally evaluated the patient during the encounter  Patient with ability to flex and extend at the knee.  No overt signs of infection.  X-ray without significant normality.  We spoke with her orthopedic surgeon who will see her in the office later this morning.  Lyanne Co, MD 12/03/11 [phone]

## 2011-12-03 NOTE — ED Notes (Signed)
Pt back from x-ray.

## 2011-12-03 NOTE — ED Notes (Signed)
Pt assisted to chair with bedpan on it. Continues to state, " ain't no need in me going home if i can't walk". Informed PA and MD of situation. Dr. Patria Mane states he will go in and speak with patient.

## 2011-12-03 NOTE — ED Notes (Signed)
Greta Doom, PA back at the bedside with pt.

## 2011-12-03 NOTE — ED Notes (Signed)
Pt brought from home by ems.  Pt has hx of right knee replacement in October and has fallen twice in last 2 days.  The most recent fall being this am with no loc.  Pt reports that her right knee feels like it just gives way while she is walking.

## 2011-12-03 NOTE — ED Notes (Signed)
Pt off bedpan and back in bed. Pt has difficult time trying to get up from sitting position but once up is able to pivot appropriately and turn herself to position herself back on the bed.

## 2011-12-03 NOTE — ED Provider Notes (Signed)
History     CSN: [id number]  Arrival date & time 12/03/11  4098   First MD Initiated Contact with Patient 12/03/11 [phone]      Chief Complaint  Patient presents with  . Fall    (Consider location/radiation/quality/duration/timing/severity/associated sxs/prior treatment) HPI  A morbidly obese 66 year-old female with recent R knee arthroplasty last month presents for evaluation of fall.  Pt has been falling three times in the past month when her knees gave out.  Sts she usually use a walker to walk, and sometimes her knees buckled.  Most recent incident was this morning.  Pt fell to her R side, and unable to get up.  She denies hitting head or LOC.  Denies any other injury except pain to both knees.  Pain is acute onset, sharp, throbbing, non radiates, worsening with movement.  Denies hip or ankle pain.  Denies numbness or weakness.  Pt request both knees to be xray.  Past Medical History  Diagnosis Date  . Hypertension   . Hyperlipemia   . Arthritis     osteoarthritis  . Diabetes mellitus     Insulin dependent    Past Surgical History  Procedure Date  . Carpal tunnel release   . Knee arthroscopy     right  . Total knee arthroplasty 10/21/2011    rt tk  . Total knee arthroplasty 10/21/2011    Procedure: TOTAL KNEE ARTHROPLASTY;  Surgeon: Raymon Mutton, MD;  Location: [facility] OR;  Service: Orthopedics;  Laterality: Right;    No family history on file.  History  Substance Use Topics  . Smoking status: Current Every Day Smoker -- 0.5 packs/day for 20 years    Types: Cigarettes  . Smokeless tobacco: Never Used  . Alcohol Use: No    OB History    Grav Para Term Preterm Abortions TAB SAB Ect Mult Living                  Review of Systems  Constitutional: Negative for fever.  Musculoskeletal: Negative for back pain.  Skin: Negative for wound.  Neurological: Negative for numbness.    Allergies  Review of patient's allergies indicates no known allergies.  Home  Medications   Current Outpatient Rx  Name  Route  Sig  Dispense  Refill  . ASPIRIN 81 MG PO TABS   Oral   Take 81 mg by mouth daily.         . CELECOXIB 200 MG PO CAPS   Oral   Take 1 capsule (200 mg total) by mouth every 12 (twelve) hours.   30 capsule   0   . VITAMIN B-12 1000 MCG SL SUBL   Sublingual   Place 1,000 mcg under the tongue daily.         [name] ENOXAPARIN SODIUM 40 MG/0.4ML [place] SOLN   Subcutaneous   Inject 0.4 mLs (40 mg total) into the skin daily.   12 Syringe   0   . INSULIN DETEMIR 100 UNIT/ML [place] SOLN   Subcutaneous   Inject 38 Units into the skin at bedtime.         [name] LIRAGLUTIDE 18 MG/3ML [place] SOLN   Subcutaneous   Inject 1.8 mg into the skin at bedtime.          . METHOCARBAMOL 500 MG PO TABS   Oral   Take 1 tablet (500 mg total) by mouth every 6 (six) hours as needed.   60 tablet   0   .  OXYCODONE HCL 5 MG PO TABS   Oral   Take 1-2 tablets (5-10 mg total) by mouth every 3 (three) hours as needed.   90 tablet   0   . OXYCODONE HCL ER 10 MG PO TB12   Oral   Take 1 tablet (10 mg total) by mouth every 12 (twelve) hours.   30 tablet   0   . ROSUVASTATIN CALCIUM 10 MG PO TABS   Oral   Take 10 mg by mouth daily.         . TELMISARTAN-HCTZ 80-25 MG PO TABS   Oral   Take 1 tablet by mouth daily.           BP 167/86  Temp 98.2 F (36.8 C) (Oral)  Resp 18  SpO2 100%  Physical Exam  Nursing note and vitals reviewed. Constitutional: She is oriented to person, place, and time. She appears well-developed and well-nourished.       Morbidly obese  HENT:  Head: Atraumatic.  Neck: Normal range of motion. Neck supple.  Musculoskeletal: She exhibits tenderness (R knee: wound vac in place, well healing midline anterior surgical scar.  diffused tenderness on exam, no deformity noted, decreased flexion, no joint laxity.  L knee: mild tenderness to anterior aspect, normal ROM, no deformity).       Right hip: Normal.       Left hip: Normal.         Right ankle: Normal.       Left ankle: Normal.       Cervical back: Normal.       Thoracic back: Normal.       Lumbar back: Normal.  Neurological: She is alert and oriented to person, place, and time.  Skin: Skin is warm. No rash noted.  Psychiatric: She has a normal mood and affect.    ED Course  Procedures (including critical care time)  Labs Reviewed - No data to display No results found.   No diagnosis found.  Results for orders placed during the hospital encounter of 10/21/11  GLUCOSE, CAPILLARY      Component Value Range   Glucose-Capillary 111 (*) 70 - 99 mg/dL  GLUCOSE, CAPILLARY      Component Value Range   Glucose-Capillary 92  70 - 99 mg/dL   Comment 1 Notify RN    HEMOGLOBIN A1C      Component Value Range   Hemoglobin A1C 5.6  <5.7 %   Mean Plasma Glucose 114  <117 mg/dL  GLUCOSE, CAPILLARY      Component Value Range   Glucose-Capillary 83  70 - 99 mg/dL  CBC      Component Value Range   WBC 10.0  4.0 - 10.5 K/uL   RBC 3.12 (*) 3.87 - 5.11 MIL/uL   Hemoglobin 9.8 (*) 12.0 - 15.0 g/dL   HCT 40.9 (*) 81.1 - 91.4 %   MCV 91.0  78.0 - 100.0 fL   MCH 31.4  26.0 - 34.0 pg   MCHC 34.5  30.0 - 36.0 g/dL   RDW 78.2  95.6 - 21.3 %   Platelets 245  150 - 400 K/uL  BASIC METABOLIC PANEL      Component Value Range   Sodium 136  135 - 145 mEq/L   Potassium 3.8  3.5 - 5.1 mEq/L   Chloride 102  96 - 112 mEq/L   CO2 28  19 - 32 mEq/L   Glucose, Bld 108 (*) 70 - 99 mg/dL  BUN 23  6 - 23 mg/dL   Creatinine, Ser 1.61 (*) 0.50 - 1.10 mg/dL   Calcium 9.0  8.4 - 09.6 mg/dL   GFR calc non Af Amer 47 (*) >90 mL/min   GFR calc Af Amer 54 (*) >90 mL/min  GLUCOSE, CAPILLARY      Component Value Range   Glucose-Capillary 94  70 - 99 mg/dL   Comment 1 Documented in Chart     Comment 2 Notify RN    GLUCOSE, CAPILLARY      Component Value Range   Glucose-Capillary 148 (*) 70 - 99 mg/dL  GLUCOSE, CAPILLARY      Component Value Range   Glucose-Capillary 100 (*) 70  - 99 mg/dL  GLUCOSE, CAPILLARY      Component Value Range   Glucose-Capillary 138 (*) 70 - 99 mg/dL   Comment 1 Notify RN     Comment 2 Documented in Chart    CBC      Component Value Range   WBC 11.5 (*) 4.0 - 10.5 K/uL   RBC 3.10 (*) 3.87 - 5.11 MIL/uL   Hemoglobin 9.6 (*) 12.0 - 15.0 g/dL   HCT 04.5 (*) 40.9 - 81.1 %   MCV 89.7  78.0 - 100.0 fL   MCH 31.0  26.0 - 34.0 pg   MCHC 34.5  30.0 - 36.0 g/dL   RDW 91.4  78.2 - 95.6 %   Platelets 258  150 - 400 K/uL  BASIC METABOLIC PANEL      Component Value Range   Sodium 135  135 - 145 mEq/L   Potassium 3.7  3.5 - 5.1 mEq/L   Chloride 101  96 - 112 mEq/L   CO2 26  19 - 32 mEq/L   Glucose, Bld 112 (*) 70 - 99 mg/dL   BUN 19  6 - 23 mg/dL   Creatinine, Ser 2.13  0.50 - 1.10 mg/dL   Calcium 9.0  8.4 - 08.6 mg/dL   GFR calc non Af Amer 56 (*) >90 mL/min   GFR calc Af Amer 65 (*) >90 mL/min  GLUCOSE, CAPILLARY      Component Value Range   Glucose-Capillary 111 (*) 70 - 99 mg/dL  GLUCOSE, CAPILLARY      Component Value Range   Glucose-Capillary 121 (*) 70 - 99 mg/dL  GLUCOSE, CAPILLARY      Component Value Range   Glucose-Capillary 108 (*) 70 - 99 mg/dL  GLUCOSE, CAPILLARY      Component Value Range   Glucose-Capillary 127 (*) 70 - 99 mg/dL   Comment 1 Notify RN     Comment 2 Documented in Chart    GLUCOSE, CAPILLARY      Component Value Range   Glucose-Capillary 118 (*) 70 - 99 mg/dL   Comment 1 Notify RN    CBC      Component Value Range   WBC 10.4  4.0 - 10.5 K/uL   RBC 2.70 (*) 3.87 - 5.11 MIL/uL   Hemoglobin 8.3 (*) 12.0 - 15.0 g/dL   HCT 57.8 (*) 46.9 - 62.9 %   MCV 89.3  78.0 - 100.0 fL   MCH 30.7  26.0 - 34.0 pg   MCHC 34.4  30.0 - 36.0 g/dL   RDW 52.8  41.3 - 24.4 %   Platelets 233  150 - 400 K/uL  BASIC METABOLIC PANEL      Component Value Range   Sodium 137  135 - 145 mEq/L   Potassium  3.5  3.5 - 5.1 mEq/L   Chloride 104  96 - 112 mEq/L   CO2 24  19 - 32 mEq/L   Glucose, Bld 97  70 - 99 mg/dL   BUN  24 (*) 6 - 23 mg/dL   Creatinine, Ser 1.19 (*) 0.50 - 1.10 mg/dL   Calcium 9.2  8.4 - 14.7 mg/dL   GFR calc non Af Amer 51 (*) >90 mL/min   GFR calc Af Amer 59 (*) >90 mL/min  GLUCOSE, CAPILLARY      Component Value Range   Glucose-Capillary 119 (*) 70 - 99 mg/dL  GLUCOSE, CAPILLARY      Component Value Range   Glucose-Capillary 100 (*) 70 - 99 mg/dL   Comment 1 Notify RN    GLUCOSE, CAPILLARY      Component Value Range   Glucose-Capillary 125 (*) 70 - 99 mg/dL   Comment 1 Notify RN     Comment 2 Documented in Chart    GLUCOSE, CAPILLARY      Component Value Range   Glucose-Capillary 92  70 - 99 mg/dL   Comment 1 Documented in Chart     Comment 2 Notify RN    BASIC METABOLIC PANEL      Component Value Range   Sodium 138  135 - 145 mEq/L   Potassium 3.8  3.5 - 5.1 mEq/L   Chloride 102  96 - 112 mEq/L   CO2 25  19 - 32 mEq/L   Glucose, Bld 78  70 - 99 mg/dL   BUN 28 (*) 6 - 23 mg/dL   Creatinine, Ser 8.29 (*) 0.50 - 1.10 mg/dL   Calcium 9.2  8.4 - 56.2 mg/dL   GFR calc non Af Amer 46 (*) >90 mL/min   GFR calc Af Amer 53 (*) >90 mL/min  GLUCOSE, CAPILLARY      Component Value Range   Glucose-Capillary 120 (*) 70 - 99 mg/dL   Comment 1 Notify RN    GLUCOSE, CAPILLARY      Component Value Range   Glucose-Capillary 72  70 - 99 mg/dL   Comment 1 Notify RN    GLUCOSE, CAPILLARY      Component Value Range   Glucose-Capillary 104 (*) 70 - 99 mg/dL   Comment 1 Documented in Chart     Comment 2 Notify RN    GLUCOSE, CAPILLARY      Component Value Range   Glucose-Capillary 99  70 - 99 mg/dL   Comment 1 Documented in Chart     Comment 2 Notify RN     Dg Knee Complete 4 Views Left  12/03/2011  *RADIOLOGY REPORT*  Clinical Data: Larey Seat yesterday, stiffness of the knee  LEFT KNEE - COMPLETE 4+ VIEW  Comparison: None.  Findings: There is mild to moderate tricompartmental degenerative joint disease primarily involving the patellofemoral articulation. No definite fracture is seen.   No effusion is noted.  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  No acute abnormality.   Original Report Authenticated By: Dwyane Dee, M.D.    Dg Knee Complete 4 Views Right  12/03/2011  *RADIOLOGY REPORT*  Clinical Data: Larey Seat yesterday with stiffness of the knee  RIGHT KNEE - COMPLETE 4+ VIEW  Comparison: None.  Findings: The femoral and tibial components of the left total knee replacement remain in good position and alignment.  No acute fracture is seen.  No joint effusion is noted.  IMPRESSION: Left total knee replacement.  No  acute abnormality.   Original Report Authenticated By: Dwyane Dee, M.D.     1. Fall 2. Knee pain, bilateral  MDM  Morbidly obese pt with recent R total knee replacement presents with recurrent fall due to her knees giving out.  No obvious deformity.  NVI.  Pt request both knees to be xray.  Does not request pain med at this time.    8:20 AM xrays of both knees show no acute fx or dislocation.  Reassurance given.  Pt to f/u with Dr. Sherlean Foot for further management.  Pt agrees with plan.  Care discussed with my attending. Pt were made aware that her blood pressure is high and will need to be recheck.    BP 167/86  Temp 98.2 F (36.8 C) (Oral)  Resp 18  SpO2 100%  I have reviewed nursing notes and vital signs. I personally reviewed the imaging tests through PACS system  I reviewed available ER/hospitalization records thought the EMR       Fayrene Helper, [place] 12/03/11 2130

## 2011-12-03 NOTE — ED Notes (Signed)
Dr. Campos at the bedside to speak with patient.  

## 2012-02-13 ENCOUNTER — Encounter (HOSPITAL_COMMUNITY): Payer: Self-pay

## 2012-02-13 ENCOUNTER — Other Ambulatory Visit: Payer: Self-pay | Admitting: Orthopedic Surgery

## 2012-02-14 ENCOUNTER — Encounter (HOSPITAL_COMMUNITY): Payer: Self-pay

## 2012-02-14 ENCOUNTER — Encounter (HOSPITAL_COMMUNITY)
Admission: RE | Admit: 2012-02-14 | Discharge: 2012-02-14 | Disposition: A | Discharge status: Home / Self Care | Payer: Medicaid Other | Source: Ambulatory Visit | Attending: Orthopedic Surgery | Admitting: Orthopedic Surgery

## 2012-02-14 LAB — APTT: aPTT: 33 seconds (ref 24–37)

## 2012-02-14 LAB — CBC WITH DIFFERENTIAL/PLATELET
Basophils Relative: 0 % (ref 0–1)
Hemoglobin: 13.1 g/dL (ref 12.0–15.0)
MCHC: 34.1 g/dL (ref 30.0–36.0)
Monocytes Relative: 6 % (ref 3–12)
Neutro Abs: 5.5 10*3/uL (ref 1.7–7.7)
Neutrophils Relative %: 61 % (ref 43–77)
Platelets: 350 10*3/uL (ref 150–400)
RBC: 4.38 MIL/uL (ref 3.87–5.11)

## 2012-02-14 LAB — COMPREHENSIVE METABOLIC PANEL
ALT: 6 U/L (ref 0–35)
AST: 10 U/L (ref 0–37)
Albumin: 3.9 g/dL (ref 3.5–5.2)
Alkaline Phosphatase: 85 U/L (ref 39–117)
BUN: 25 mg/dL — ABNORMAL HIGH (ref 6–23)
Chloride: 101 mEq/L (ref 96–112)
Potassium: 3.7 mEq/L (ref 3.5–5.1)
Sodium: 138 mEq/L (ref 135–145)
Total Bilirubin: 0.5 mg/dL (ref 0.3–1.2)
Total Protein: 7.9 g/dL (ref 6.0–8.3)

## 2012-02-14 MED ORDER — CHLORHEXIDINE GLUCONATE 4 % EX LIQD: 60.0000 mL | Freq: Once | CUTANEOUS | Status: DC

## 2012-02-14 MED ORDER — SODIUM CHLORIDE 0.9 % IV SOLN: INTRAVENOUS | Status: DC

## 2012-02-14 MED ORDER — DEXTROSE 5 % IV SOLN: 3.0000 g | INTRAVENOUS | Status: DC

## 2012-02-14 NOTE — Progress Notes (Signed)
Patient informed Nurse that she had a stress test with Dr. Jacinto Halim last year. Records requested. Patient denied having a cardiac cath, or sleep study. Urinalysis not collected at this time as patient informed Nurse that she urinated before leaving home. Nurse gave patient urine specimen and specific instructions on how to do a clean catch urine. Patient verbalized understanding and will bring specimen DOS.

## 2012-02-14 NOTE — Pre-Procedure Instructions (Signed)
Mary [name]  02/14/2012   Your procedure is scheduled on:  Monday February 17, 2012.  Report to [facility] at 8:00 AM.  Call this number if you have problems the morning of surgery: [phone]   Remember:   Do not eat food or drink liquids after midnight.   Take these medicines the morning of surgery with A SIP OF WATER: Gabapentin (Neurontin), Oxycodone (Percocet) if needed for pain   Do not wear jewelry, make-up or nail polish.  Do not wear lotions, powders, or perfumes. You may NOT wear deodorant.  Do not shave 48 hours prior to surgery.   Do not bring valuables to the hospital.  Contacts, dentures or bridgework may not be worn into surgery.  Leave suitcase in the car. After surgery it may be brought to your room.  For patients admitted to the hospital, checkout time is 11:00 AM the day of discharge.   Patients discharged the day of surgery will not be allowed to drive home.  Name and phone number of your driver:   Special Instructions: Shower using CHG 2 nights before surgery and the night before surgery.  If you shower the day of surgery use CHG.  Use special wash - you have one bottle of CHG for all showers.  You should use approximately 1/3 of the bottle for each shower.   Please read over the following fact sheets that you were given: Pain Booklet, Coughing and Deep Breathing, MRSA Information and Surgical Site Infection Prevention

## 2012-02-17 ENCOUNTER — Inpatient Hospital Stay (HOSPITAL_COMMUNITY): Payer: Medicaid Other | Admitting: Certified Registered"

## 2012-02-17 ENCOUNTER — Encounter (HOSPITAL_COMMUNITY)
Admission: RE | Disposition: A | Discharge status: Home / Self Care | Payer: Self-pay | Source: Ambulatory Visit | Attending: Orthopedic Surgery

## 2012-02-17 ENCOUNTER — Encounter (HOSPITAL_COMMUNITY): Payer: Self-pay | Admitting: Surgery

## 2012-02-17 ENCOUNTER — Inpatient Hospital Stay (HOSPITAL_COMMUNITY)
Admission: RE | Admit: 2012-02-17 | Discharge: 2012-02-19 | DRG: 908 | Disposition: A | Discharge status: Home / Self Care | Payer: Medicaid Other | Source: Ambulatory Visit | Attending: Orthopedic Surgery | Admitting: Orthopedic Surgery

## 2012-02-17 ENCOUNTER — Encounter (HOSPITAL_COMMUNITY): Payer: Self-pay | Admitting: Certified Registered"

## 2012-02-17 DIAGNOSIS — D62 Acute posthemorrhagic anemia: Secondary | ICD-10-CM | POA: Diagnosis not present

## 2012-02-17 DIAGNOSIS — T81329A Deep disruption or dehiscence of operation wound, unspecified, initial encounter: Principal | ICD-10-CM | POA: Diagnosis present

## 2012-02-17 DIAGNOSIS — Y831 Surgical operation with implant of artificial internal device as the cause of abnormal reaction of the patient, or of later complication, without mention of misadventure at the time of the procedure: Secondary | ICD-10-CM | POA: Diagnosis present

## 2012-02-17 DIAGNOSIS — Z96659 Presence of unspecified artificial knee joint: Secondary | ICD-10-CM

## 2012-02-17 DIAGNOSIS — Z794 Long term (current) use of insulin: Secondary | ICD-10-CM

## 2012-02-17 DIAGNOSIS — E785 Hyperlipidemia, unspecified: Secondary | ICD-10-CM | POA: Diagnosis present

## 2012-02-17 DIAGNOSIS — M1990 Unspecified osteoarthritis, unspecified site: Secondary | ICD-10-CM | POA: Diagnosis present

## 2012-02-17 DIAGNOSIS — I10 Essential (primary) hypertension: Secondary | ICD-10-CM | POA: Diagnosis present

## 2012-02-17 DIAGNOSIS — F17200 Nicotine dependence, unspecified, uncomplicated: Secondary | ICD-10-CM | POA: Diagnosis present

## 2012-02-17 DIAGNOSIS — M25569 Pain in unspecified knee: Secondary | ICD-10-CM

## 2012-02-17 DIAGNOSIS — E119 Type 2 diabetes mellitus without complications: Secondary | ICD-10-CM | POA: Diagnosis present

## 2012-02-17 DIAGNOSIS — T8132XA Disruption of internal operation (surgical) wound, not elsewhere classified, initial encounter: Principal | ICD-10-CM | POA: Diagnosis present

## 2012-02-17 HISTORY — PX: QUADRICEPS TENDON REPAIR: SHX756

## 2012-02-17 LAB — HEMOGLOBIN A1C: Hgb A1c MFr Bld: 5.6 % (ref <5.7)

## 2012-02-17 SURGERY — REPAIR, TENDON, QUADRICEPS
Anesthesia: General | Site: Knee | Laterality: Right | Wound class: Clean

## 2012-02-17 MED ORDER — METHOCARBAMOL 500 MG PO TABS
500.0000 mg | ORAL_TABLET | Freq: Four times a day (QID) | ORAL | Status: DC | PRN
  Administered 2012-02-17 – 2012-02-19 (×4): 500 mg via ORAL
  Filled 2012-02-17 (×4): qty 1

## 2012-02-17 MED ORDER — DEXTROSE 5 % IV SOLN: 3.0000 g | Freq: Once | INTRAVENOUS | Status: DC

## 2012-02-17 MED ORDER — ONDANSETRON HCL 4 MG/2ML IJ SOLN: 4.0000 mg | Freq: Four times a day (QID) | INTRAMUSCULAR | Status: DC | PRN

## 2012-02-17 MED ORDER — INSULIN GLARGINE 100 UNIT/ML ~~LOC~~ SOLN
5.0000 [IU] | Freq: Every day | SUBCUTANEOUS | Status: DC
  Administered 2012-02-17 – 2012-02-18 (×2): 5 [IU] via SUBCUTANEOUS

## 2012-02-17 MED ORDER — IRBESARTAN 300 MG PO TABS
300.0000 mg | ORAL_TABLET | Freq: Every day | ORAL | Status: DC
  Administered 2012-02-17 – 2012-02-19 (×3): 300 mg via ORAL
  Filled 2012-02-17 (×3): qty 1

## 2012-02-17 MED ORDER — SUFENTANIL CITRATE 50 MCG/ML IV SOLN
INTRAVENOUS | Status: DC | PRN
  Administered 2012-02-17: 5 ug via INTRAVENOUS
  Administered 2012-02-17: 20 ug via INTRAVENOUS

## 2012-02-17 MED ORDER — LACTATED RINGERS IV SOLN
INTRAVENOUS | Status: DC
  Administered 2012-02-17: 50 mL/h via INTRAVENOUS
  Administered 2012-02-17 (×2): via INTRAVENOUS

## 2012-02-17 MED ORDER — OXYCODONE HCL 5 MG PO TABS: 5.0000 mg | ORAL_TABLET | Freq: Once | ORAL | Status: DC | PRN

## 2012-02-17 MED ORDER — DIPHENHYDRAMINE HCL 12.5 MG/5ML PO ELIX: 12.5000 mg | ORAL_SOLUTION | ORAL | Status: DC | PRN

## 2012-02-17 MED ORDER — PROMETHAZINE HCL 25 MG/ML IJ SOLN: 6.2500 mg | INTRAMUSCULAR | Status: DC | PRN

## 2012-02-17 MED ORDER — FENTANYL CITRATE 0.05 MG/ML IJ SOLN
INTRAMUSCULAR | Status: AC
  Filled 2012-02-17: qty 2

## 2012-02-17 MED ORDER — SODIUM CHLORIDE 0.9 % IV SOLN
INTRAVENOUS | Status: DC
  Administered 2012-02-17: 22:00:00 via INTRAVENOUS

## 2012-02-17 MED ORDER — HYDROCODONE-ACETAMINOPHEN 5-325 MG PO TABS
1.0000 | ORAL_TABLET | ORAL | Status: DC | PRN
  Administered 2012-02-19: 2 via ORAL
  Filled 2012-02-17: qty 2

## 2012-02-17 MED ORDER — DEXTROSE 5 % IV SOLN
3.0000 g | INTRAVENOUS | Status: AC
  Administered 2012-02-17: 3 g via INTRAVENOUS
  Filled 2012-02-17: qty 3000

## 2012-02-17 MED ORDER — HYDROCHLOROTHIAZIDE 25 MG PO TABS
25.0000 mg | ORAL_TABLET | Freq: Every day | ORAL | Status: DC
  Administered 2012-02-17 – 2012-02-19 (×3): 25 mg via ORAL
  Filled 2012-02-17 (×3): qty 1

## 2012-02-17 MED ORDER — BISACODYL 10 MG RE SUPP: 10.0000 mg | Freq: Every day | RECTAL | Status: DC | PRN

## 2012-02-17 MED ORDER — ONDANSETRON HCL 4 MG/2ML IJ SOLN
INTRAMUSCULAR | Status: DC | PRN
  Administered 2012-02-17: 4 mg via INTRAVENOUS

## 2012-02-17 MED ORDER — ASPIRIN 325 MG PO TABS
325.0000 mg | ORAL_TABLET | Freq: Every day | ORAL | Status: DC
  Administered 2012-02-18 – 2012-02-19 (×2): 325 mg via ORAL
  Filled 2012-02-17 (×2): qty 1

## 2012-02-17 MED ORDER — CEFAZOLIN SODIUM-DEXTROSE 2-3 GM-% IV SOLR
2.0000 g | Freq: Four times a day (QID) | INTRAVENOUS | Status: AC
  Administered 2012-02-17 – 2012-02-18 (×3): 2 g via INTRAVENOUS
  Filled 2012-02-17 (×4): qty 50

## 2012-02-17 MED ORDER — OXYCODONE HCL 5 MG/5ML PO SOLN: 5.0000 mg | Freq: Once | ORAL | Status: AC | PRN

## 2012-02-17 MED ORDER — HYDROMORPHONE HCL PF 1 MG/ML IJ SOLN
INTRAMUSCULAR | Status: AC
  Filled 2012-02-17: qty 1

## 2012-02-17 MED ORDER — GABAPENTIN 300 MG PO CAPS
300.0000 mg | ORAL_CAPSULE | Freq: Two times a day (BID) | ORAL | Status: DC
  Administered 2012-02-17 – 2012-02-19 (×5): 300 mg via ORAL
  Filled 2012-02-17 (×7): qty 1

## 2012-02-17 MED ORDER — OXYCODONE HCL 5 MG PO TABS
5.0000 mg | ORAL_TABLET | ORAL | Status: DC | PRN
  Administered 2012-02-17 – 2012-02-19 (×8): 10 mg via ORAL
  Filled 2012-02-17 (×8): qty 2

## 2012-02-17 MED ORDER — SENNOSIDES-DOCUSATE SODIUM 8.6-50 MG PO TABS: 1.0000 | ORAL_TABLET | Freq: Every evening | ORAL | Status: DC | PRN

## 2012-02-17 MED ORDER — MIDAZOLAM HCL 2 MG/2ML IJ SOLN
1.0000 mg | INTRAMUSCULAR | Status: DC | PRN
  Administered 2012-02-17: 2 mg via INTRAVENOUS

## 2012-02-17 MED ORDER — DOCUSATE SODIUM 100 MG PO CAPS
100.0000 mg | ORAL_CAPSULE | Freq: Two times a day (BID) | ORAL | Status: DC
  Administered 2012-02-17 – 2012-02-19 (×4): 100 mg via ORAL
  Filled 2012-02-17 (×6): qty 1

## 2012-02-17 MED ORDER — OXYCODONE HCL 5 MG/5ML PO SOLN: 5.0000 mg | Freq: Once | ORAL | Status: DC | PRN

## 2012-02-17 MED ORDER — METHOCARBAMOL 100 MG/ML IJ SOLN
500.0000 mg | Freq: Four times a day (QID) | INTRAVENOUS | Status: DC | PRN
  Filled 2012-02-17: qty 5

## 2012-02-17 MED ORDER — 0.9 % SODIUM CHLORIDE (POUR BTL) OPTIME
TOPICAL | Status: DC | PRN
  Administered 2012-02-17: 1000 mL

## 2012-02-17 MED ORDER — OXYCODONE HCL 5 MG PO TABS
5.0000 mg | ORAL_TABLET | Freq: Once | ORAL | Status: AC | PRN
  Administered 2012-02-17: 5 mg via ORAL

## 2012-02-17 MED ORDER — ONDANSETRON HCL 4 MG PO TABS: 4.0000 mg | ORAL_TABLET | Freq: Four times a day (QID) | ORAL | Status: DC | PRN

## 2012-02-17 MED ORDER — INSULIN ASPART 100 UNIT/ML ~~LOC~~ SOLN
0.0000 [IU] | Freq: Three times a day (TID) | SUBCUTANEOUS | Status: DC
  Administered 2012-02-17 – 2012-02-18 (×2): 7 [IU] via SUBCUTANEOUS
  Administered 2012-02-18: 3 [IU] via SUBCUTANEOUS

## 2012-02-17 MED ORDER — INSULIN ASPART 100 UNIT/ML ~~LOC~~ SOLN: 0.0000 [IU] | Freq: Every day | SUBCUTANEOUS | Status: DC

## 2012-02-17 MED ORDER — CEFAZOLIN SODIUM 1-5 GM-% IV SOLN
INTRAVENOUS | Status: AC
  Filled 2012-02-17: qty 50

## 2012-02-17 MED ORDER — ATORVASTATIN CALCIUM 20 MG PO TABS
20.0000 mg | ORAL_TABLET | Freq: Every day | ORAL | Status: DC
  Administered 2012-02-17 – 2012-02-18 (×2): 20 mg via ORAL
  Filled 2012-02-17 (×3): qty 1

## 2012-02-17 MED ORDER — HYDROMORPHONE HCL PF 1 MG/ML IJ SOLN: 0.5000 mg | INTRAMUSCULAR | Status: DC | PRN

## 2012-02-17 MED ORDER — ACETAMINOPHEN 10 MG/ML IV SOLN
1000.0000 mg | Freq: Four times a day (QID) | INTRAVENOUS | Status: DC
  Administered 2012-02-17: 1000 mg via INTRAVENOUS

## 2012-02-17 MED ORDER — METOCLOPRAMIDE HCL 5 MG/ML IJ SOLN: 5.0000 mg | Freq: Three times a day (TID) | INTRAMUSCULAR | Status: DC | PRN

## 2012-02-17 MED ORDER — TELMISARTAN-HCTZ 80-25 MG PO TABS: 1.0000 | ORAL_TABLET | Freq: Every day | ORAL | Status: DC

## 2012-02-17 MED ORDER — OXYCODONE HCL 5 MG PO TABS
ORAL_TABLET | ORAL | Status: AC
  Filled 2012-02-17: qty 1

## 2012-02-17 MED ORDER — CEFAZOLIN SODIUM-DEXTROSE 2-3 GM-% IV SOLR
INTRAVENOUS | Status: AC
  Filled 2012-02-17: qty 50

## 2012-02-17 MED ORDER — FLEET ENEMA 7-19 GM/118ML RE ENEM: 1.0000 | ENEMA | Freq: Once | RECTAL | Status: AC | PRN

## 2012-02-17 MED ORDER — MIDAZOLAM HCL 2 MG/2ML IJ SOLN
INTRAMUSCULAR | Status: AC
  Filled 2012-02-17: qty 2

## 2012-02-17 MED ORDER — DEXAMETHASONE SODIUM PHOSPHATE 4 MG/ML IJ SOLN
INTRAMUSCULAR | Status: DC | PRN
  Administered 2012-02-17: 10 mg

## 2012-02-17 MED ORDER — METOCLOPRAMIDE HCL 10 MG PO TABS: 5.0000 mg | ORAL_TABLET | Freq: Three times a day (TID) | ORAL | Status: DC | PRN

## 2012-02-17 MED ORDER — HYDROMORPHONE HCL PF 1 MG/ML IJ SOLN: 0.2500 mg | INTRAMUSCULAR | Status: DC | PRN

## 2012-02-17 MED ORDER — PROPOFOL 10 MG/ML IV BOLUS
INTRAVENOUS | Status: DC | PRN
  Administered 2012-02-17: 50 mg via INTRAVENOUS
  Administered 2012-02-17: 150 mg via INTRAVENOUS

## 2012-02-17 MED ORDER — HYDROMORPHONE HCL PF 1 MG/ML IJ SOLN
0.2500 mg | INTRAMUSCULAR | Status: DC | PRN
  Administered 2012-02-17 (×2): 0.5 mg via INTRAVENOUS

## 2012-02-17 MED ORDER — ACETAMINOPHEN 10 MG/ML IV SOLN
INTRAVENOUS | Status: AC
  Filled 2012-02-17: qty 100

## 2012-02-17 MED ORDER — FENTANYL CITRATE 0.05 MG/ML IJ SOLN
50.0000 ug | INTRAMUSCULAR | Status: DC | PRN
Start: 2012-02-17 — End: 2012-02-17
  Administered 2012-02-17: 100 ug via INTRAVENOUS

## 2012-02-17 MED ORDER — BUPIVACAINE-EPINEPHRINE PF 0.5-1:200000 % IJ SOLN
INTRAMUSCULAR | Status: DC | PRN
  Administered 2012-02-17: 150 mg

## 2012-02-17 SURGICAL SUPPLY — 52 items
BANDAGE ELASTIC 6 VELCRO ST LF (GAUZE/BANDAGES/DRESSINGS) IMPLANT
BANDAGE ESMARK 6X9 LF (GAUZE/BANDAGES/DRESSINGS) ×1 IMPLANT
BNDG COHESIVE 4X5 TAN STRL (GAUZE/BANDAGES/DRESSINGS) ×2 IMPLANT
BNDG ESMARK 6X9 LF (GAUZE/BANDAGES/DRESSINGS) ×2
CLOTH BEACON ORANGE TIMEOUT ST (SAFETY) ×2 IMPLANT
CUFF TOURNIQUET SINGLE 34IN LL (TOURNIQUET CUFF) IMPLANT
CUFF TOURNIQUET SINGLE 44IN (TOURNIQUET CUFF) ×2 IMPLANT
DRAPE INCISE IOBAN 66X45 STRL (DRAPES) ×4 IMPLANT
DRAPE PROXIMA HALF (DRAPES) ×2 IMPLANT
DRAPE U-SHAPE 47X51 STRL (DRAPES) ×2 IMPLANT
DRSG ADAPTIC 3X8 NADH LF (GAUZE/BANDAGES/DRESSINGS) ×2 IMPLANT
DRSG PAD ABDOMINAL 8X10 ST (GAUZE/BANDAGES/DRESSINGS) ×2 IMPLANT
ELECT CAUTERY BLADE 6.4 (BLADE) ×2 IMPLANT
ELECT REM PT RETURN 9FT ADLT (ELECTROSURGICAL) ×2
ELECTRODE REM PT RTRN 9FT ADLT (ELECTROSURGICAL) ×1 IMPLANT
GAUZE XEROFORM 1X8 LF (GAUZE/BANDAGES/DRESSINGS) IMPLANT
GLOVE BIOGEL PI IND STRL 7.5 (GLOVE) IMPLANT
GLOVE BIOGEL PI IND STRL 8.5 (GLOVE) ×2 IMPLANT
GLOVE BIOGEL PI INDICATOR 7.5 (GLOVE)
GLOVE BIOGEL PI INDICATOR 8.5 (GLOVE) ×2
GLOVE SURG ORTHO 7.0 STRL STRW (GLOVE) IMPLANT
GLOVE SURG ORTHO 8.0 STRL STRW (GLOVE) ×4 IMPLANT
GOWN STRL NON-REIN LRG LVL3 (GOWN DISPOSABLE) ×6 IMPLANT
IMMOBILIZER KNEE 24 THIGH 36 (MISCELLANEOUS) ×1 IMPLANT
IMMOBILIZER KNEE 24 UNIV (MISCELLANEOUS) ×2
KIT ROOM TURNOVER OR (KITS) ×2 IMPLANT
MANIFOLD NEPTUNE II (INSTRUMENTS) ×2 IMPLANT
PACK ORTHO EXTREMITY (CUSTOM PROCEDURE TRAY) ×2 IMPLANT
PAD ARMBOARD 7.5X6 YLW CONV (MISCELLANEOUS) ×4 IMPLANT
PAD CAST 4YDX4 CTTN HI CHSV (CAST SUPPLIES) ×1 IMPLANT
PADDING CAST COTTON 4X4 STRL (CAST SUPPLIES) ×1
PADDING CAST COTTON 6X4 STRL (CAST SUPPLIES) ×2 IMPLANT
PASSER SUT SWANSON 36MM LOOP (INSTRUMENTS) IMPLANT
PENCIL BUTTON HOLSTER BLD 10FT (ELECTRODE) ×2 IMPLANT
SPONGE GAUZE 4X4 12PLY (GAUZE/BANDAGES/DRESSINGS) ×2 IMPLANT
SPONGE LAP 18X18 X RAY DECT (DISPOSABLE) ×2 IMPLANT
SPONGE LAP 4X18 X RAY DECT (DISPOSABLE) ×2 IMPLANT
STOCKINETTE IMPERVIOUS LG (DRAPES) ×4 IMPLANT
SUT FIBERWIRE #2 38 T-5 BLUE (SUTURE) ×4
SUT MNCRL AB 3-0 PS2 18 (SUTURE) IMPLANT
SUT VIC AB 0 CT1 27 (SUTURE) ×2
SUT VIC AB 0 CT1 27XBRD ANBCTR (SUTURE) ×2 IMPLANT
SUT VIC AB 1 CT1 27 (SUTURE) ×1
SUT VIC AB 1 CT1 27XBRD ANBCTR (SUTURE) ×1 IMPLANT
SUT VIC AB 2-0 CT1 27 (SUTURE) ×3
SUT VIC AB 2-0 CT1 TAPERPNT 27 (SUTURE) ×3 IMPLANT
SUTURE FIBERWR #2 38 T-5 BLUE (SUTURE) ×2 IMPLANT
TOWEL OR 17X24 6PK STRL BLUE (TOWEL DISPOSABLE) ×2 IMPLANT
TOWEL OR 17X26 10 PK STRL BLUE (TOWEL DISPOSABLE) ×2 IMPLANT
TUBE CONNECTING 12X1/4 (SUCTIONS) ×2 IMPLANT
WATER STERILE IRR 1000ML POUR (IV SOLUTION) IMPLANT
YANKAUER SUCT BULB TIP NO VENT (SUCTIONS) ×2 IMPLANT

## 2012-02-17 NOTE — Progress Notes (Signed)
Stress test results were not found at Dr. Verl Dicker office. Patient informed Nurse that the results were sent to her PCP. Nurse called PCP Dr. Dorothyann Peng and left a voicemail with medical records requesting that stress test records be sent as patient was having surgery today.

## 2012-02-17 NOTE — Anesthesia Preprocedure Evaluation (Addendum)
Anesthesia Evaluation  Patient identified by MRN, date of birth, ID band Patient awake    Reviewed: Allergy & Precautions, H&P , NPO status , Patient's Chart, lab work & pertinent test results, reviewed documented beta blocker date and time   History of Anesthesia Complications Negative for: history of anesthetic complications  Airway Mallampati: I TM Distance: >3 FB Neck ROM: Full    Dental  (+) Edentulous Upper, Poor Dentition and Dental Advisory Given   Pulmonary Current Smoker,    Pulmonary exam normal       Cardiovascular hypertension,     Neuro/Psych negative neurological ROS  negative psych ROS   GI/Hepatic negative GI ROS, Neg liver ROS,   Endo/Other  diabetesMorbid obesity  Renal/GU negative Renal ROS     Musculoskeletal   Abdominal   Peds  Hematology negative hematology ROS (+)   Anesthesia Other Findings   Reproductive/Obstetrics                         Anesthesia Physical Anesthesia Plan  ASA: III  Anesthesia Plan: General   Post-op Pain Management:    Induction: Intravenous  Airway Management Planned: LMA and Oral ETT  Additional Equipment:   Intra-op Plan:   Post-operative Plan: Extubation in OR  Informed Consent: I have reviewed the patients History and Physical, chart, labs and discussed the procedure including the risks, benefits and alternatives for the proposed anesthesia with the patient or authorized representative who has indicated his/her understanding and acceptance.   Dental advisory given  Plan Discussed with: CRNA, Anesthesiologist and Surgeon  Anesthesia Plan Comments:         Anesthesia Quick Evaluation

## 2012-02-17 NOTE — Anesthesia Postprocedure Evaluation (Signed)
Anesthesia Post Note  Patient: Mary [name]  Procedure(s) Performed: Procedure(s) (LRB): REPAIR QUADRICEP TENDON (Right)  Anesthesia type: general  Patient location: PACU  Post pain: Pain level controlled  Post assessment: Patient's Cardiovascular Status Stable  Last Vitals:  Filed Vitals:   02/17/12 1330  BP:   Pulse: 90  Temp:   Resp: 12    Post vital signs: Reviewed and stable  Level of consciousness: sedated  Complications: No apparent anesthesia complications

## 2012-02-17 NOTE — Anesthesia Procedure Notes (Signed)
Anesthesia Regional Block:  Femoral nerve block  Pre-Anesthetic Checklist: ,, timeout performed, Correct Patient, Correct Site, Correct Laterality, Correct Procedure, Correct Position, site marked, Risks and benefits discussed,  Surgical consent,  Pre-op evaluation,  At surgeon's request and post-op pain management  Laterality: Right  Prep: chloraprep       Needles:  Injection technique: Single-shot  Needle Type: Echogenic Stimulator Needle     Needle Length: 5cm 5 cm Needle Gauge: 22 and 22 G    Additional Needles:  Procedures: ultrasound guided (picture in chart) and nerve stimulator Femoral nerve block  Nerve Stimulator or Paresthesia:  Response: quadraceps contraction, 0.45 mA,   Additional Responses:   Narrative:  Start time: 02/17/2012 9:32 AM End time: 02/17/2012 9:42 AM Injection made incrementally with aspirations every 5 mL.  Performed by: Personally  Anesthesiologist: Halford Decamp, MD  Additional Notes: Functioning IV was confirmed and monitors were applied.  A 50mm 22ga Arrow echogenic stimulator needle was used. Sterile prep and drape,hand hygiene and sterile gloves were used. Ultrasound guidance: relevant anatomy identified, needle position confirmed, local anesthetic spread visualized around nerve(s)., vascular puncture avoided.  Image printed for medical record. Negative aspiration and negative test dose prior to incremental administration of local anesthetic. The patient tolerated the procedure well.    Femoral nerve block

## 2012-02-17 NOTE — Transfer of Care (Signed)
Immediate Anesthesia Transfer of Care Note  Patient: Mary [name]  Procedure(s) Performed: Procedure(s) (LRB) with comments: REPAIR QUADRICEP TENDON (Right) - right quadriceps repair with latera release  Patient Location: PACU  Anesthesia Type:GA combined with regional for post-op pain  Level of Consciousness: awake  Airway & Oxygen Therapy: Patient Spontanous Breathing and Patient connected to nasal cannula oxygen  Post-op Assessment: Report given to PACU RN, Post -op Vital signs reviewed and stable and Patient moving all extremities  Post vital signs: Reviewed and stable  Complications: No apparent anesthesia complications

## 2012-02-17 NOTE — Preoperative (Signed)
Beta Blockers   Reason not to administer Beta Blockers:Not Applicable 

## 2012-02-17 NOTE — H&P (Signed)
  Mary [name] MRN:  [id number] DOB/SEX:  1945/05/28/female  CHIEF COMPLAINT:  Painful right Knee  HISTORY: Patient is a 67 y.o. female presented with a history of pain in the right knee. Onset of symptoms was abrupt starting several weeks ago with gradually worsening course since that time. Patient is experience trouble with patella tracking that is interfering with ability to transfer and quad flexion. Prior procedures on the knee include arthroplasty. Patient has been treated conservatively with over-the-counter NSAIDs and activity modification. Patient currently rates pain in the knee at 8 out of 10 with activity. There is pain at night.  PAST MEDICAL HISTORY: There are no active problems to display for this patient.  Past Medical History  Diagnosis Date  . Hyperlipemia   . Arthritis     osteoarthritis  . Diabetes mellitus     Insulin dependent  . Hypertension    Past Surgical History  Procedure Date  . Carpal tunnel release   . Knee arthroscopy     right  . Total knee arthroplasty 10/21/2011    rt tk  . Total knee arthroplasty 10/21/2011    Procedure: TOTAL KNEE ARTHROPLASTY;  Surgeon: Raymon Mutton, MD;  Location: [facility] OR;  Service: Orthopedics;  Laterality: Right;     MEDICATIONS:   No prescriptions prior to admission    ALLERGIES:  No Known Allergies  REVIEW OF SYSTEMS:  Pertinent items are noted in HPI.   FAMILY HISTORY:  No family history on file.  SOCIAL HISTORY:   History  Substance Use Topics  . Smoking status: Current Every Day Smoker -- 0.2 packs/day for 20 years    Types: Cigarettes  . Smokeless tobacco: Never Used  . Alcohol Use: No     EXAMINATION:  Vital signs in last 24 hours:    General appearance: alert, cooperative and mild distress Lungs: clear to auscultation bilaterally Heart: regular rate and rhythm, S1, S2 normal, no murmur, click, rub or gallop Abdomen: soft, non-tender; bowel sounds normal; no masses,  no  organomegaly Extremities: extremities normal, atraumatic, no cyanosis or edema and Homans sign is negative, no sign of DVT Pulses: 2+ and symmetric Neurologic: Alert and oriented X 3, normal strength and tone. Normal symmetric reflexes. Normal coordination and gait  Musculoskeletal:  ROM is limited with inability to extend at the knee actively, lateral patellar tracking  Imaging Review Plain radiographs demonstrate components in good location  of the right knee. The overall alignment is neutral. The bone quality appears to be good for age and reported activity level.  Assessment/Plan:  right knee quad repair  The patient history, physical examination and imaging studies are consistent with lateral patellar tracking of the right knee. The patient has failed conservative treatment.  The clearance notes were reviewed.  After discussion with the patient it was felt that right quad repair was indicated. The procedure,  risks, and benefits of total knee arthroplasty were presented and reviewed. The patient acknowledged the explanation, agreed to proceed with the plan.  Darl Kuss 02/17/2012, 6:31 AM

## 2012-02-17 NOTE — Progress Notes (Signed)
Patient unable to urinate at this time. 

## 2012-02-18 LAB — GLUCOSE, CAPILLARY
Glucose-Capillary: 133 mg/dL — ABNORMAL HIGH (ref 70–99)
Glucose-Capillary: 103 mg/dL — ABNORMAL HIGH (ref 70–99)
Glucose-Capillary: 202 mg/dL — ABNORMAL HIGH (ref 70–99)
Glucose-Capillary: 116 mg/dL — ABNORMAL HIGH (ref 70–99)

## 2012-02-18 LAB — BASIC METABOLIC PANEL
BUN: 20 mg/dL (ref 6–23)
Chloride: 102 mEq/L (ref 96–112)
GFR calc Af Amer: 79 mL/min — ABNORMAL LOW (ref >90)
Glucose, Bld: 138 mg/dL — ABNORMAL HIGH (ref 70–99)
Potassium: 3.5 mEq/L (ref 3.5–5.1)

## 2012-02-18 NOTE — Evaluation (Signed)
Physical Therapy Evaluation Patient Details Name: Mary [name] MRN: [id number] DOB: [birth year] Today's Date: 02/18/2012 Time: 1478-2956 PT Time Calculation (min): 29 min  PT Assessment / Plan / Recommendation Clinical Impression  pt presents with R Quad Tendon Repair.  pt generally weak and unable to maintain TDWBing on R LE during any mobility.  Lengthy discussion with pt about safety and mobility as pt has 1 step to enter home and only her daughter at home to A.  pt would benefit from CIR to maximize Independence prior to home with daughter.  Spoke with SW and CM about pt's situation and need for further therapies and concern that if pt was sent straight home she would be unable to maintain the TDWBing.      PT Assessment  Patient needs continued PT services    Follow Up Recommendations  CIR    Does the patient have the potential to tolerate intense rehabilitation      Barriers to Discharge None      Equipment Recommendations  Wheelchair (measurements PT);Wheelchair cushion (measurements PT) (Wide drop arm 3-in-1)    Recommendations for Other Services OT consult;Rehab consult   Frequency Min 5X/week    Precautions / Restrictions Precautions Precautions: Fall Precaution Comments: No ROM R knee Required Braces or Orthoses: Knee Immobilizer - Right Knee Immobilizer - Right: On at all times Restrictions Weight Bearing Restrictions: Yes RLE Weight Bearing: Touchdown weight bearing   Pertinent Vitals/Pain Indicates pain is minimal.        Mobility  Bed Mobility Bed Mobility: Supine to Sit;Sitting - Scoot to Edge of Bed Supine to Sit: With rails;HOB elevated;3: Mod assist Sitting - Scoot to Edge of Bed: 4: Min assist Details for Bed Mobility Assistance: A with R LE and at trunk.  cues for sequencing and encouragement.   Transfers Transfers: Sit to Stand;Stand to Sit;Stand Pivot Transfers Sit to Stand: 1: +2 Total assist;With upper extremity assist;From bed Sit to Stand:  Patient Percentage: 60% Stand to Sit: 4: Min assist;With upper extremity assist;To chair/3-in-1;With armrests Stand Pivot Transfers: 4: Min assist Details for Transfer Assistance: cues for use of UEs, positioning of LEs, TDWBing, movement through SPT.  pt unable to maintain TDWBing on R LE during mobility.  Would benefit from trying slide board transfer to prevent WBing on R LE.   Ambulation/Gait Ambulation/Gait Assistance: Not tested (comment) Stairs: No Wheelchair Mobility Wheelchair Mobility: No    Exercises     PT Diagnosis: Difficulty walking  PT Problem List: Decreased strength;Decreased activity tolerance;Decreased balance;Decreased mobility;Decreased knowledge of use of DME;Decreased knowledge of precautions;Obesity PT Treatment Interventions: DME instruction;Gait training;Stair training;Functional mobility training;Therapeutic activities;Therapeutic exercise;Balance training;Patient/family education   PT Goals Acute Rehab PT Goals PT Goal Formulation: With patient Time For Goal Achievement: 03/03/12 Potential to Achieve Goals: Good Pt will go Supine/Side to Sit: with modified independence PT Goal: Supine/Side to Sit - Progress: Goal set today Pt will go Sit to Supine/Side: with modified independence PT Goal: Sit to Supine/Side - Progress: Goal set today Pt will go Sit to Stand: with supervision PT Goal: Sit to Stand - Progress: Goal set today Pt will go Stand to Sit: with supervision PT Goal: Stand to Sit - Progress: Goal set today Pt will Transfer Bed to Chair/Chair to Bed: with supervision PT Transfer Goal: Bed to Chair/Chair to Bed - Progress: Goal set today Pt will Stand: with supervision;3 - 5 min;with bilateral upper extremity support PT Goal: Stand - Progress: Goal set today Pt will Ambulate: 1 - 15  feet;with min assist;with rolling walker PT Goal: Ambulate - Progress: Goal set today Pt will Go Up / Down Stairs: 1-2 stairs;with mod assist;with least restrictive  assistive device PT Goal: Up/Down Stairs - Progress: Goal set today  Visit Information  Last PT Received On: 02/18/12 Assistance Needed: +2    Subjective Data  Subjective: I'm not sure what I'm going to do.   Patient Stated Goal: Walk   Prior Functioning  Home Living Lives With: Daughter Available Help at Discharge: Family;Available 24 hours/day Type of Home: House Home Access: Stairs to enter [employer] of Steps: 1 Entrance Stairs-Rails: None Home Layout: One level Bathroom Shower/Tub: Engineer, manufacturing systems: Standard Home Adaptive Equipment: Bedside commode/3-in-1;Walker - rolling Prior Function Level of Independence: Needs assistance Needs Assistance: Meal Prep;Light Housekeeping;Gait Meal Prep: Moderate Light Housekeeping: Maximal Gait Assistance: Uses RW.   Able to Take Stairs?: Yes Driving: No Vocation: Unemployed Communication Communication: No difficulties    Cognition  Cognition Overall Cognitive Status: Appears within functional limits for tasks assessed/performed Arousal/Alertness: Awake/alert Orientation Level: Appears intact for tasks assessed Behavior During Session: [facility] for tasks performed    Extremity/Trunk Assessment Right Lower Extremity Assessment RLE ROM/Strength/Tone: Deficits RLE ROM/Strength/Tone Deficits: pt in R KI with order for no ROM R knee.  Ankle/Foot WFL, Hip Generally weak.   RLE Sensation: WFL - Light Touch Left Lower Extremity Assessment LLE ROM/Strength/Tone: WFL for tasks assessed LLE Sensation: WFL - Light Touch Trunk Assessment Trunk Assessment: Normal   Balance Balance Balance Assessed: Yes Static Sitting Balance Static Sitting - Balance Support: No upper extremity supported;Feet supported Static Sitting - Level of Assistance: 6: Modified independent (Device/Increase time)  End of Session PT - End of Session Equipment Utilized During Treatment: Gait belt;Right knee immobilizer Activity Tolerance:  Patient tolerated treatment well Patient left: in chair;with call bell/phone within reach Nurse Communication: Mobility status  GP     Sunny Schlein, [place] [phone] 02/18/2012, 10:19 AM

## 2012-02-18 NOTE — Progress Notes (Signed)
Patient admitted for quad tendon repair due to painful right knee. PT evaluation indicates that patient had difficulty with mobility due with difficulty maintaining TDWB. Note that she's WBAT and anticipate that this will improve her mobility. Will follow along to note her progress with PT in am prior to formal evaluation.

## 2012-02-18 NOTE — Op Note (Signed)
Dictation Number:  [id number]

## 2012-02-18 NOTE — Progress Notes (Signed)
SPORTS MEDICINE AND JOINT REPLACEMENT  [name], MD   Altamese Cabal, PA-C [address] [place], [place], [place]  [address]                             [phone]   PROGRESS NOTE  Subjective:  negative for Chest Pain  negative for Shortness of Breath  negative for Nausea/Vomiting   negative for Calf Pain  negative for Bowel Movement   Tolerating Diet: yes         Patient reports pain as 5 on 0-10 scale.    Objective: Vital signs in last 24 hours:   Patient Vitals for the past 24 hrs:  BP Temp Temp src Pulse Resp SpO2  02/18/12 0600 129/77 mmHg 98.2 F (36.8 C) - 86  18  93 %  02/18/12 0153 122/72 mmHg 97.7 F (36.5 C) - 91  16  99 %  02/17/12 2059 130/86 mmHg 98.9 F (37.2 C) - 94  16  100 %  02/17/12 1523 151/86 mmHg 98.1 F (36.7 C) Oral 94  18  100 %  02/17/12 1415 157/84 mmHg 98.2 F (36.8 C) - 90  16  99 %  02/17/12 1400 - - - 88  12  100 %  02/17/12 1345 152/90 mmHg 97.2 F (36.2 C) - 89  15  100 %    @flow {1959:LAST@   Intake/Output from previous day:   02/03 0701 - 02/04 0700 In: 1740 [P.O.:340; I.V.:1400] Out: 650 [Urine:600]   Intake/Output this shift:       Intake/Output      02/03 0701 - 02/04 0700 02/04 0701 - 02/05 0700   P.O. 340    I.V. 1400    Total Intake 1740    Urine 600    Blood 50    Total Output 650    Net +1090         Urine Occurrence 3 x       LABORATORY DATA:  Basename 02/14/12 1433  WBC 9.1  HGB 13.1  HCT 38.4  PLT 350    Basename 02/18/12 0450 02/14/12 1433  NA 139 138  K 3.5 3.7  CL 102 101  CO2 25 25  BUN 20 25*  CREATININE 0.88 1.05  GLUCOSE 138* 68*  CALCIUM 9.4 10.2   Lab Results  Component Value Date   INR 0.91 02/14/2012   INR 0.97 10/11/2011    Examination:  General appearance: alert, cooperative and no distress Extremities: Homans sign is negative, no sign of DVT  Wound Exam: clean, dry, intact   Drainage:  None: wound tissue dry  Motor Exam: EHL and FHL Intact  Sensory Exam: Deep  Peroneal normal   Assessment:    1 Day Post-Op  Procedure(s) (LRB): REPAIR QUADRICEP TENDON (Right)  ADDITIONAL DIAGNOSIS:  Active Problems:  * No active hospital problems. *   Acute Blood Loss Anemia   Plan: Physical Therapy as ordered Weight Bearing as Tolerated (WBAT)  DVT Prophylaxis:  Lovenox  DISCHARGE PLAN: Home  DISCHARGE NEEDS: HHPT, CPM, Walker and 3-in-1 comode seat         Keyatta Tolles 02/18/2012, 1:33 PM

## 2012-02-18 NOTE — Evaluation (Signed)
Occupational Therapy Evaluation Patient Details Name: Mary [name] MRN: [id number] DOB: 1945/05/04 Today's Date: 02/18/2012 Time: 8295-6213 OT Time Calculation (min): 29 min  OT Assessment / Plan / Recommendation Clinical Impression  Pt presents to OT s/p quadricep tendon repain. Pt with overall decreased I with ADL activity - although feel pt will do much better since WB status is now increased. Pt will benefit from skilled OT to increase I with ADL activity and return to PLOF    OT Assessment  Patient needs continued OT Services    Follow Up Recommendations  Home health OT;CIR;Other (comment) (depending on progress)       Equipment Recommendations  None recommended by OT       Frequency  Min 3X/week    Precautions / Restrictions Precautions Precautions: Fall Precaution Comments: No ROM R knee Required Braces or Orthoses: Knee Immobilizer - Right Knee Immobilizer - Right: On at all times Restrictions Weight Bearing Restrictions: Yes RLE Weight Bearing: Weight bearing as tolerated Other Position/Activity Restrictions: PA changed WB status to WBAT with KI after OT session       ADL  Upper Body Bathing: Simulated;Minimal assistance Where Assessed - Upper Body Bathing: Unsupported sitting Lower Body Bathing: Simulated;Maximal assistance Where Assessed - Lower Body Bathing: Supported sit to stand Upper Body Dressing: Simulated;Minimal assistance Where Assessed - Upper Body Dressing: Unsupported sitting Lower Body Dressing: Simulated;Maximal assistance Where Assessed - Lower Body Dressing: Supported sit to Art therapist Transfer: Performed;Moderate assistance Toilet Transfer Method: Sit to stand Toilet Transfer Equipment: Other (comment) (sit to stand from chair only) Toileting - Clothing Manipulation and Hygiene: Simulated;Moderate assistance Where Assessed - Toileting Clothing Manipulation and Hygiene: Standing Transfers/Ambulation Related to ADLs: Upon eval order was for  TDWB which pt unable to maintain. PA came in at end of eval and OT explained concern. PA called MD who stated WB should have been FWB with KI.      OT Diagnosis: Generalized weakness;Acute pain  OT Problem List: Decreased strength;Decreased activity tolerance;Pain OT Treatment Interventions: Self-care/ADL training;DME and/or AE instruction;Patient/family education   OT Goals Acute Rehab OT Goals OT Goal Formulation: With patient Time For Goal Achievement: 03/03/12 ADL Goals Pt Will Perform Grooming: with supervision;Standing at sink ADL Goal: Grooming - Progress: Goal set today Pt Will Perform Lower Body Dressing: with supervision;Sit to stand from chair ADL Goal: Lower Body Dressing - Progress: Goal set today Pt Will Transfer to Toilet: with supervision;Comfort height toilet ADL Goal: Toilet Transfer - Progress: Goal set today Pt Will Perform Toileting - Clothing Manipulation: with supervision;Standing ADL Goal: Toileting - Clothing Manipulation - Progress: Goal set today  Visit Information  Last OT Received On: 02/18/12 Assistance Needed: +2 Reason Eval/Treat Not Completed: Other (comment) (TDWB limiting pt but MD changed WB post OT session)    Subjective Data  Subjective: i dont know what i am going to do   Prior Functioning     Home Living Lives With: Daughter Available Help at Discharge: Family;Available 24 hours/day Type of Home: House Home Access: Stairs to enter [employer] of Steps: 1 Entrance Stairs-Rails: None Home Layout: One level Bathroom Shower/Tub: Engineer, manufacturing systems: Standard Home Adaptive Equipment: Bedside commode/3-in-1;Walker - rolling Prior Function Level of Independence: Needs assistance Needs Assistance: Meal Prep;Light Housekeeping;Gait Meal Prep: Moderate Light Housekeeping: Maximal Gait Assistance: Uses RW.   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   Cognition  Cognition Overall Cognitive Status: Appears within functional limits for tasks assessed/performed Arousal/Alertness: Awake/alert Orientation Level: Appears intact for tasks assessed Behavior During Session: [facility] for tasks performed    Extremity/Trunk Assessment Right Upper Extremity Assessment RUE ROM/Strength/Tone: [facility] for tasks assessed Left Upper Extremity Assessment LUE ROM/Strength/Tone: WFL for tasks assessed Right Lower Extremity Assessment RLE ROM/Strength/Tone: Deficits RLE ROM/Strength/Tone Deficits: pt in R KI with order for no ROM R knee.  Ankle/Foot WFL, Hip Generally weak.   RLE Sensation: WFL - Light Touch Left Lower Extremity Assessment LLE ROM/Strength/Tone: WFL for tasks assessed LLE Sensation: WFL - Light Touch Trunk Assessment Trunk Assessment: Normal     Mobility Bed Mobility Bed Mobility: Supine to Sit;Sitting - Scoot to Edge of Bed Supine to Sit: With rails;HOB elevated;3: Mod assist Sitting - Scoot to Edge of Bed: 4: Min assist Details for Bed Mobility Assistance: A with R LE and at trunk.  cues for sequencing and encouragement.   Transfers Transfers: Sit to Stand;Stand to Sit Sit to Stand: 3: Mod assist;From chair/3-in-1  Stand to Sit: 3: Mod assist;To chair/3-in-1 .          Balance Balance Balance Assessed: Yes Static Sitting Balance Static Sitting - Balance Support: No upper extremity supported;Feet supported Static Sitting - Level of Assistance: 6: Modified independent (Device/Increase time)   End of Session OT - End of Session Activity Tolerance: Patient tolerated treatment well Patient left: in chair  GO     Mary [name], [place] 02/18/2012, 12:42 PM

## 2012-02-18 NOTE — Progress Notes (Signed)
Rehab Admissions Coordinator Note:  Patient was screened by Brock Ra for appropriateness for an Inpatient Acute Rehab Consult.  At this time, we are recommending Inpatient Rehab consult.  Melanee Spry S 02/18/2012, 11:36 AM  I can be reached at [phone].

## 2012-02-18 NOTE — Consult Note (Signed)
WOC consult Note Reason for Consult: evaluate right foot wound and noted to have right heel pressure ulcer at the time of my assessment Wound type: callous right great toe-medial, Unstageable pressure ulcer of the right heel  Pressure Ulcer POA: Yes Measurement:right heel: 5cm x 6cm x 0 Wound WUJ:WJXBJ heel: callous with dark tissue present, some aspects of DTI, skin is hyperkeratotic around the wound but not fluctuant   Drainage (amount, consistency, odor) no drainage from either site Dressing procedure/placement/frequency: add prevalon boot for heel offloading, betadine for the heel daily, and no topical care needed for right great toe callous.  Discussed with pt that she needs follow up with podiatry for paring of her callous, adequate toe box needed in any shoes that she wears to avoid further trauma.  She will need to observe her heel daily with mirror if possible to make sure the heel remains stable. She can apply emollient over the counter to soften her dry skin.   Re consult if needed, will not follow at this time. Thanks  Zoe Goonan [employer], [place] [phone]

## 2012-02-19 ENCOUNTER — Encounter (HOSPITAL_COMMUNITY): Payer: Self-pay | Admitting: Orthopedic Surgery

## 2012-02-19 LAB — BASIC METABOLIC PANEL
BUN: 26 mg/dL — ABNORMAL HIGH (ref 6–23)
CO2: 29 mEq/L (ref 19–32)
Calcium: 9.3 mg/dL (ref 8.4–10.5)
Creatinine, Ser: 0.93 mg/dL (ref 0.50–1.10)

## 2012-02-19 LAB — GLUCOSE, CAPILLARY: Glucose-Capillary: 96 mg/dL (ref 70–99)

## 2012-02-19 MED ORDER — OXYCODONE-ACETAMINOPHEN 5-325 MG PO TABS: 1.0000 | ORAL_TABLET | ORAL | Status: DC | PRN

## 2012-02-19 MED ORDER — METHOCARBAMOL 500 MG PO TABS: 500.0000 mg | ORAL_TABLET | Freq: Four times a day (QID) | ORAL | Status: DC | PRN

## 2012-02-19 MED ORDER — ASPIRIN 325 MG PO TABS: 325.0000 mg | ORAL_TABLET | Freq: Every day | ORAL | Status: DC

## 2012-02-19 NOTE — Discharge Summary (Signed)
SPORTS MEDICINE & JOINT REPLACEMENT   [name], MD   Altamese Cabal, PA-C [address] [place], [place], [place]  [address]                             [phone]  PATIENT ID: Mary [name]        MRN:  [id number]          DOB/AGE: 08/20/1945 / 67 y.o.    DISCHARGE SUMMARY  ADMISSION DATE:    02/17/2012 DISCHARGE DATE:   02/19/2012   ADMISSION DIAGNOSIS: quad weakness, knee pain s/p TKA    DISCHARGE DIAGNOSIS:  quad weakness, knee pain s/p TKA    ADDITIONAL DIAGNOSIS: Active Problems:  * No active hospital problems. *   Past Medical History  Diagnosis Date  . Hyperlipemia   . Arthritis     osteoarthritis  . Diabetes mellitus     Insulin dependent  . Hypertension     PROCEDURE: Procedure(s): REPAIR QUADRICEP TENDON on 02/17/2012  CONSULTS:     HISTORY:  See H&P in chart  HOSPITAL COURSE:  Mary [name] is a 67 y.o. admitted on 02/17/2012 and found to have a diagnosis of quad weakness, knee pain s/p TKA.  After appropriate laboratory studies were obtained  they were taken to the operating room on 02/17/2012 and underwent Procedure(s): REPAIR QUADRICEP TENDON.   They were given perioperative antibiotics:  Anti-infectives     Start     Dose/Rate Route Frequency Ordered Stop   02/17/12 1530   ceFAZolin (ANCEF) IVPB 2 g/50 mL premix        2 g 100 mL/hr over 30 Minutes Intravenous Every 6 hours 02/17/12 1426 02/18/12 0727   02/17/12 1005   ceFAZolin (ANCEF) 1-5 GM-% IVPB     Comments: BUTLER, DAVE: cabinet override         02/17/12 1005 02/17/12 2214   02/17/12 1004   ceFAZolin (ANCEF) 2-3 GM-% IVPB SOLR     Comments: BUTLER, DAVE: cabinet override         02/17/12 1004 02/17/12 2214   02/17/12 0830   ceFAZolin (ANCEF) 3 g in dextrose 5 % 50 mL IVPB        3 g 160 mL/hr over 30 Minutes Intravenous 60 min pre-op 02/17/12 0830 02/17/12 1040   02/17/12 0815   ceFAZolin (ANCEF) 3 g in dextrose 5 % 50 mL IVPB  Status:  Discontinued        3 g 160 mL/hr over 30  Minutes Intravenous  Once 02/17/12 0806 02/17/12 0831        .  Tolerated the procedure well.  Placed with a foley intraoperatively.  Given Ofirmev at induction and for 48 hours.    POD# 1: Vital signs were stable.  Patient denied Chest pain, shortness of breath, or calf pain.  Patient was started on ASA for DVT prohylaxis.  Consults to PT, OT, and care management were made.  The patient was weight bearing as tolerated in immobilizer.   Incentive spirometry was taught.  Dressing was changed.        POD #2, Continued  PT for ambulation and exercise program.  IV saline locked.  O2 discontinued.    The remainder of the hospital course was dedicated to ambulation and strengthening.   The patient was discharged on 2 Days Post-Op in  Good condition.  Blood products given:none  DIAGNOSTIC STUDIES: Recent vital signs: Patient Vitals for the past  24 hrs:  BP Temp Temp src Pulse Resp SpO2  02/19/12 0635 128/84 mmHg 98.2 F (36.8 C) - 79  18  99 %  02/18/12 2002 120/68 mmHg 97.6 F (36.4 C) Axillary 88  16  100 %  02/18/12 1430 132/75 mmHg 98 F (36.7 C) - 86  17  100 %       Recent laboratory studies:  [facility] 02/14/12 1433  WBC 9.1  HGB 13.1  HCT 38.4  PLT 350    Basename 02/19/12 0520 02/18/12 0450 02/14/12 1433  NA 139 139 138  K 3.7 3.5 3.7  CL 103 102 101  CO2 29 25 25   BUN 26* 20 25*  CREATININE 0.93 0.88 1.05  GLUCOSE 104* 138* 68*  CALCIUM 9.3 9.4 10.2   Lab Results  Component Value Date   INR 0.91 02/14/2012   INR 0.97 10/11/2011     Recent Radiographic Studies :  No results found.  DISCHARGE INSTRUCTIONS: Discharge Orders    Future Orders Please Complete By Expires   Diet - low sodium heart healthy      Call MD / Call 911      Comments:   If you experience chest pain or shortness of breath, CALL 911 and be transported to the hospital emergency room.  If you develope a fever above 101 F, pus (white drainage) or increased drainage or redness at the wound,  or calf pain, call your surgeon's office.   Constipation Prevention      Comments:   Drink plenty of fluids.  Prune juice may be helpful.  You may use a stool softener, such as Colace (over the counter) 100 mg twice a day.  Use MiraLax (over the counter) for constipation as needed.   Increase activity slowly as tolerated      Weight bearing as tolerated      Comments:   In immobilizer   Driving restrictions      Comments:   No driving for 6 weeks   Lifting restrictions      Comments:   No lifting for 6 weeks   Change dressing      Comments:   Change dressing on thursday, then change the dressing daily with sterile 4 x 4 inch gauze dressing and apply TED hose.      DISCHARGE MEDICATIONS:     Medication List     As of 02/19/2012  1:02 PM    STOP taking these medications         aspirin EC 81 MG tablet      TAKE these medications         aspirin 325 MG tablet   Take 1 tablet (325 mg total) by mouth daily.      gabapentin 300 MG capsule   Commonly known as: NEURONTIN   Take 300 mg by mouth 2 (two) times daily.      insulin detemir 100 UNIT/ML injection   Commonly known as: LEVEMIR   Inject 32 Units into the skin at bedtime.      methocarbamol 500 MG tablet   Commonly known as: ROBAXIN   Take 1 tablet (500 mg total) by mouth every 6 (six) hours as needed.      oxyCODONE-acetaminophen 5-325 MG per tablet   Commonly known as: PERCOCET/ROXICET   Take 1-2 tablets by mouth every 4 (four) hours as needed. For pain      rosuvastatin 10 MG tablet   Commonly known as: CRESTOR   Take 10  mg by mouth daily.      telmisartan-hydrochlorothiazide 80-25 MG per tablet   Commonly known as: MICARDIS HCT   Take 1 tablet by mouth daily.      TYLENOL ARTHRITIS PAIN PO   Take 2 tablets by mouth daily as needed. For arthritis pain      VICTOZA 18 MG/3ML Soln injection   Generic drug: Liraglutide   Inject 1.8 mg into the skin at bedtime.      Vitamin B-12 1000 MCG Subl   Place  1,000 mcg under the tongue daily.        FOLLOW UP VISIT:       Follow-up Information    Follow up with Raymon Mutton, MD. Call on 03/03/2012.   Contact information:   [address] [place] [address] [phone]          DISPOSITION: HOME   CONDITION:  Good  Mary [name] 02/19/2012, 1:02 PM

## 2012-02-19 NOTE — Progress Notes (Signed)
Patient discharged in stable condition via wheelchair home. Discharge instructions and prescriptions were given and explained. 

## 2012-02-19 NOTE — Op Note (Signed)
NAMEJEANNI, ALLSHOUSE              ACCOUNT NO.:  [id number]  MEDICAL RECORD NO.:  [id number]  LOCATION:  5N19C                        FACILITY:  [facility]  PHYSICIAN:  Mila Homer. Sherlean Foot, M.D. DATE OF BIRTH:  10-28-1945  DATE OF PROCEDURE: DATE OF DISCHARGE:                              OPERATIVE REPORT   SURGEON:  Mila Homer. Sherlean Foot, M.D.  ASSISTANT:  Skip Mayer, PA-C.  PREOPERATIVE DIAGNOSIS:  Right knee VMO rupture/quad rupture.  PROCEDURE:  Right knee quadriceps repair.  INDICATION FOR PROCEDURE:  The patient is a 67 year old, obese black female, who came in to my office and is fourth month status post knee replacement.  Unable to do a straight-leg raise test.  I felt that she ruptured the repair of her quadriceps mechanism postoperatively.  The pain was mainly due to her body weight and __________ physical therapy.  DESCRIPTION OF PROCEDURE:  The patient was laid supine and administered general anesthesia.  The right leg was prepped and draped in the usual sterile fashion.  The extremity was exsanguinated with an Esmarch. Tourniquet inflated to 400 mmHg.  I then made the old incision used the central 2/3 of that incision, made with a #10 blade using the needle to get down the paratenon.  I then evaluated the VMO and I did feel like it was closed and there was no open arthrotomy, however, it was a filmy scar type tissue, and I do think it is stressed out significantly and the patella was tracking laterally.  I therefore incised the VMO medial border of the quad tendon, exposing good tissue.  Tendinous tissue on the medial side and scar tissue on the lateral side.  I then excised scar tissue to get to good tendon laterally.  I then went in and checked the tracking until the patella was aligned well.  I went ahead and did some lateralization just to ensure that the medial reefing and imbrication would be successful.  I then performed a medial imbrication with 6 horizontal  mattress sutures with #2 FiberWire oversewing them with #1 figure-of-eight Vicryl sutures.  This afforded excellent tracking of the patella.  I then lavaged, closed with buried 0 Vicryl, 2- 0 Vicryl, and skin staples.  Dressed with Xeroform, dressing sponges, sterile Webril, and Ace wrap.  COMPLICATIONS:  None.  DRAINS:  None.         ______________________________ Mila Homer. Sherlean Foot, M.D.    SDL/MEDQ  D:  02/18/2012  T:  02/18/2012  Job:  [id number]

## 2012-02-19 NOTE — Progress Notes (Signed)
Chart reviewed and note that patient did well with therapies. HH therapies recommended and patient set for discharge today. Will defer CIR consult.

## 2012-02-19 NOTE — Progress Notes (Signed)
Physical Therapy Treatment Patient Details Name: Mary [name] MRN: [id number] DOB: [birth year] Today's Date: 02/19/2012 Time: 4782-9562 PT Time Calculation (min): 27 min  PT Assessment / Plan / Recommendation Comments on Treatment Session  pt presents with R Quad Tendon Repair.  pt much improved now that she is WBAT.  pt able to ambulate and perform single step.  pt now safe for D/C to home with daughter to A.  Would benefit from HHPT, though understand pt may not have insurance coverage for this.  No DME needed.      Follow Up Recommendations  Home health PT;Supervision/Assistance - 24 hour     Does the patient have the potential to tolerate intense rehabilitation     Barriers to Discharge        Equipment Recommendations  None recommended by PT    Recommendations for Other Services    Frequency Min 5X/week   Plan Discharge plan needs to be updated;Frequency remains appropriate    Precautions / Restrictions Precautions Precautions: Fall Precaution Comments: No ROM R knee Required Braces or Orthoses: Knee Immobilizer - Right Knee Immobilizer - Right: On at all times Restrictions Weight Bearing Restrictions: Yes RLE Weight Bearing: Weight bearing as tolerated   Pertinent Vitals/Pain Indicates pain 4/10 with activity.      Mobility  Bed Mobility Bed Mobility: Not assessed Transfers Transfers: Sit to Stand;Stand to Sit Sit to Stand: 4: Min assist;With upper extremity assist;From chair/3-in-1;With armrests Stand to Sit: 4: Min assist;With upper extremity assist;To chair/3-in-1;With armrests Details for Transfer Assistance: demos good use of UEs, needs A to control descent to chair.   Ambulation/Gait Ambulation/Gait Assistance: 4: Min guard Ambulation Distance (Feet): 30 Feet Assistive device: Rolling walker Ambulation/Gait Assistance Details: cues for gait sequencing, positioning in RW.   Gait Pattern: Step-to pattern;Decreased step length - left;Decreased stance time -  right;Decreased stride length Stairs: Yes Stairs Assistance: 4: Min assist Stairs Assistance Details (indicate cue type and reason): cues for safe technique.   Stair Management Technique: No rails;Backwards;With walker Number of Stairs: 1  Wheelchair Mobility Wheelchair Mobility: No    Exercises     PT Diagnosis:    PT Problem List:   PT Treatment Interventions:     PT Goals Acute Rehab PT Goals Time For Goal Achievement: 03/03/12 Potential to Achieve Goals: Good PT Goal: Sit to Stand - Progress: Progressing toward goal PT Goal: Stand to Sit - Progress: Progressing toward goal PT Transfer Goal: Bed to Chair/Chair to Bed - Progress: Progressing toward goal PT Goal: Stand - Progress: Met PT Goal: Ambulate - Progress: Met PT Goal: Up/Down Stairs - Progress: Met  Visit Information  Last PT Received On: 02/19/12 Assistance Needed: +1    Subjective Data  Subjective: I hope this is a lot better today.     Cognition  Cognition Overall Cognitive Status: Appears within functional limits for tasks assessed/performed Arousal/Alertness: Awake/alert Orientation Level: Appears intact for tasks assessed Behavior During Session: [facility] for tasks performed    Balance  Balance Balance Assessed: No  End of Session PT - End of Session Equipment Utilized During Treatment: Gait belt;Right knee immobilizer Activity Tolerance: Patient tolerated treatment well Patient left: in chair;with call bell/phone within reach Nurse Communication: Mobility status   GP     Sunny Schlein, [place] [phone] 02/19/2012, 9:10 AM

## 2012-02-19 NOTE — Progress Notes (Signed)
SPORTS MEDICINE AND JOINT REPLACEMENT  [name], MD   Altamese Cabal, PA-C [address] [place], [place], [place]  [address]                             [phone]   PROGRESS NOTE  Subjective:  negative for Chest Pain  negative for Shortness of Breath  negative for Nausea/Vomiting   negative for Calf Pain  negative for Bowel Movement   Tolerating Diet: yes         Patient reports pain as 2 on 0-10 scale and 5 on 0-10 scale.    Objective: Vital signs in last 24 hours:   Patient Vitals for the past 24 hrs:  BP Temp Temp src Pulse Resp SpO2  02/19/12 0635 128/84 mmHg 98.2 F (36.8 C) - 79  18  99 %  02/18/12 2002 120/68 mmHg 97.6 F (36.4 C) Axillary 88  16  100 %  02/18/12 1430 132/75 mmHg 98 F (36.7 C) - 86  17  100 %    @flow {1959:LAST@   Intake/Output from previous day:   02/04 0701 - 02/05 0700 In: 840 [P.O.:720; I.V.:120] Out: 300 [Urine:300]   Intake/Output this shift:       Intake/Output      02/04 0701 - 02/05 0700 02/05 0701 - 02/06 0700   P.O. 720    I.V. 120    Total Intake 840    Urine 300    Blood     Total Output 300    Net +540         Urine Occurrence 5 x       LABORATORY DATA:  Basename 02/14/12 1433  WBC 9.1  HGB 13.1  HCT 38.4  PLT 350    Basename 02/19/12 0520 02/18/12 0450 02/14/12 1433  NA 139 139 138  K 3.7 3.5 3.7  CL 103 102 101  CO2 29 25 25   BUN 26* 20 25*  CREATININE 0.93 0.88 1.05  GLUCOSE 104* 138* 68*  CALCIUM 9.3 9.4 10.2   Lab Results  Component Value Date   INR 0.91 02/14/2012   INR 0.97 10/11/2011    Examination:  General appearance: alert, cooperative and no distress Extremities: Homans sign is negative, no sign of DVT  Wound Exam: clean, dry, intact   Drainage:  None: wound tissue dry  Motor Exam: EHL and FHL Intact  Sensory Exam: Deep Peroneal normal   Assessment:    2 Days Post-Op  Procedure(s) (LRB): REPAIR QUADRICEP TENDON (Right)  ADDITIONAL DIAGNOSIS:  Active Problems:  * No  active hospital problems. *   Acute Blood Loss Anemia   Plan: Physical Therapy as ordered Weight Bearing as Tolerated (WBAT) in immobilzer  DVT Prophylaxis:  Lovenox  DISCHARGE PLAN: Home  DISCHARGE NEEDS: HHPT, Walker and 3-in-1 comode seat         Bani Gianfrancesco 02/19/2012, 12:53 PM

## 2012-02-19 NOTE — Progress Notes (Signed)
Occupational Therapy Treatment Patient Details Name: Mary [name] MRN: [id number] DOB: 10-14-1945 Today's Date: 02/19/2012 Time: 4696-2952 OT Time Calculation (min): 27 min  OT Assessment / Plan / Recommendation Comments on Treatment Session Pt making progress and ahould continue with OT services to maximize level of function and safety to return home    Follow Up Recommendations  Home health OT    Barriers to Discharge       Equipment Recommendations  Tub/shower bench    Recommendations for Other Services    Frequency Min 2X/week   Plan Discharge plan remains appropriate    Precautions / Restrictions Precautions Precautions: Fall;Knee Precaution Comments: no ROM R knee Required Braces or Orthoses: Knee Immobilizer - Right Knee Immobilizer - Right: On at all times Restrictions Weight Bearing Restrictions: Yes RLE Weight Bearing: Weight bearing as tolerated Other Position/Activity Restrictions: Pt now WBAT   Pertinent Vitals/Pain     ADL  Grooming: Performed;Wash/dry hands;Wash/dry face;Supervision/safety Where Assessed - Grooming: Supported standing Lower Body Bathing: Simulated;Moderate assistance Where Assessed - Lower Body Bathing: Unsupported sitting;Supported standing Lower Body Dressing: Moderate assistance;Performed Where Assessed - Lower Body Dressing: Unsupported sitting;Supported standing Toilet Transfer: Performed;Minimal assistance Toilet Transfer Method: Other (comment) (ambulating from RW level) Toilet Transfer Equipment: Raised toilet seat with arms (or 3-in-1 over toilet);Grab bars Toileting - Clothing Manipulation and Hygiene: Performed;Min guard Where Assessed - Glass blower/designer Manipulation and Hygiene: Standing ADL Comments: pt provded with education and demo of tub transfer bench and  ADL A/E for home use, pt able to return demo with assistance    OT Diagnosis:    OT Problem List:   OT Treatment Interventions:     OT Goals ADL Goals ADL  Goal: Grooming - Progress: Progressing toward goals ADL Goal: Lower Body Dressing - Progress: Progressing toward goals ADL Goal: Toilet Transfer - Progress: Progressing toward goals ADL Goal: Toileting - Clothing Manipulation - Progress: Progressing toward goals  Visit Information  Last OT Received On: 02/19/12 Assistance Needed: +1    Subjective Data  Subjective: " I am glad I'm not NWB anymore " Patient Stated Goal: To return home   Prior Functioning       Cognition  Cognition Overall Cognitive Status: Appears within functional limits for tasks assessed/performed Arousal/Alertness: Awake/alert Orientation Level: Appears intact for tasks assessed Behavior During Session: [facility] for tasks performed    Mobility  Bed Mobility Bed Mobility: Not assessed Transfers Transfers: Sit to Stand;Stand to Sit Sit to Stand: 4: Min assist;With armrests;From chair/3-in-1;Other (comment) (tub bench) Stand to Sit: 4: Min assist;With armrests;To chair/3-in-1;Other (comment) (tub bench) Details for Transfer Assistance: demos good use of UEs, needs A to control descent to chair.      Exercises      Balance Balance Balance Assessed: No   End of Session OT - End of Session Equipment Utilized During Treatment: Gait belt;Right knee immobilizer;Other (comment) (3 in 1, RW, tub bench, ADL A/E) Activity Tolerance: Patient tolerated treatment well Patient left: in chair;with call bell/phone within reach  GO     Galen Manila 02/19/2012, 12:05 PM

## 2012-04-20 ENCOUNTER — Ambulatory Visit: Payer: Medicaid Other | Attending: Orthopedic Surgery | Admitting: Physical Therapy

## 2012-04-20 DIAGNOSIS — IMO0001 Reserved for inherently not codable concepts without codable children: Secondary | ICD-10-CM | POA: Insufficient documentation

## 2012-04-20 DIAGNOSIS — R262 Difficulty in walking, not elsewhere classified: Secondary | ICD-10-CM | POA: Insufficient documentation

## 2012-04-20 DIAGNOSIS — R269 Unspecified abnormalities of gait and mobility: Secondary | ICD-10-CM | POA: Insufficient documentation

## 2012-04-20 DIAGNOSIS — M25569 Pain in unspecified knee: Secondary | ICD-10-CM | POA: Insufficient documentation

## 2012-04-20 DIAGNOSIS — M25669 Stiffness of unspecified knee, not elsewhere classified: Secondary | ICD-10-CM | POA: Insufficient documentation

## 2012-04-28 ENCOUNTER — Ambulatory Visit: Payer: Medicaid Other | Admitting: Physical Therapy

## 2012-05-04 ENCOUNTER — Ambulatory Visit: Payer: Medicaid Other | Admitting: Rehabilitation

## 2012-05-04 ENCOUNTER — Encounter: Payer: Self-pay | Admitting: Physical Therapy

## 2012-05-05 ENCOUNTER — Encounter: Payer: Medicaid Other | Admitting: Physical Therapy

## 2012-05-05 ENCOUNTER — Other Ambulatory Visit: Payer: Self-pay | Admitting: Orthopedic Surgery

## 2012-05-05 ENCOUNTER — Ambulatory Visit
Admission: RE | Admit: 2012-05-05 | Discharge: 2012-05-05 | Disposition: A | Discharge status: Home / Self Care | Payer: Medicaid Other | Source: Ambulatory Visit | Attending: Orthopedic Surgery | Admitting: Orthopedic Surgery

## 2012-05-05 DIAGNOSIS — M25561 Pain in right knee: Secondary | ICD-10-CM

## 2012-05-05 DIAGNOSIS — Z96659 Presence of unspecified artificial knee joint: Secondary | ICD-10-CM | POA: Insufficient documentation

## 2012-05-08 ENCOUNTER — Encounter (HOSPITAL_COMMUNITY): Payer: Self-pay

## 2012-05-11 ENCOUNTER — Other Ambulatory Visit: Payer: Self-pay | Admitting: Orthopedic Surgery

## 2012-05-13 ENCOUNTER — Encounter (HOSPITAL_COMMUNITY): Admission: RE | Admit: 2012-05-13 | Payer: Medicaid Other | Source: Ambulatory Visit

## 2012-05-13 ENCOUNTER — Encounter (HOSPITAL_COMMUNITY): Payer: Self-pay

## 2012-05-13 ENCOUNTER — Encounter (HOSPITAL_COMMUNITY)
Admission: RE | Admit: 2012-05-13 | Discharge: 2012-05-13 | Disposition: A | Discharge status: Home / Self Care | Payer: Medicaid Other | Source: Ambulatory Visit | Attending: Orthopedic Surgery | Admitting: Orthopedic Surgery

## 2012-05-13 LAB — CBC WITH DIFFERENTIAL/PLATELET
Basophils Absolute: 0 10*3/uL (ref 0.0–0.1)
HCT: 39.8 % (ref 36.0–46.0)
Lymphocytes Relative: 34 % (ref 12–46)
Monocytes Absolute: 0.5 10*3/uL (ref 0.1–1.0)
Neutro Abs: 5.1 10*3/uL (ref 1.7–7.7)
Neutrophils Relative %: 57 % (ref 43–77)
RDW: 12.8 % (ref 11.5–15.5)
WBC: 9 10*3/uL (ref 4.0–10.5)

## 2012-05-13 LAB — COMPREHENSIVE METABOLIC PANEL
ALT: 8 U/L (ref 0–35)
AST: 13 U/L (ref 0–37)
Albumin: 3.7 g/dL (ref 3.5–5.2)
Alkaline Phosphatase: 103 U/L (ref 39–117)
CO2: 27 mEq/L (ref 19–32)
Chloride: 106 mEq/L (ref 96–112)
Creatinine, Ser: 0.9 mg/dL (ref 0.50–1.10)
GFR calc non Af Amer: 66 mL/min — ABNORMAL LOW (ref >90)
Potassium: 3.9 mEq/L (ref 3.5–5.1)
Sodium: 142 mEq/L (ref 135–145)
Total Bilirubin: 0.5 mg/dL (ref 0.3–1.2)

## 2012-05-13 LAB — URINE MICROSCOPIC-ADD ON

## 2012-05-13 LAB — URINALYSIS, ROUTINE W REFLEX MICROSCOPIC
Bilirubin Urine: NEGATIVE
Glucose, UA: NEGATIVE mg/dL
Ketones, ur: NEGATIVE mg/dL
Specific Gravity, Urine: 1.02 (ref 1.005–1.030)
pH: 5.5 (ref 5.0–8.0)

## 2012-05-13 LAB — APTT: aPTT: 31 seconds (ref 24–37)

## 2012-05-13 LAB — SURGICAL PCR SCREEN: Staphylococcus aureus: NEGATIVE

## 2012-05-13 NOTE — Pre-Procedure Instructions (Addendum)
Arlicia Paquette  05/13/2012   Your procedure is scheduled on:  Friday, May 15, 2012  Report to [facility] at 5:30 AM.  Call this number if you have problems the morning of surgery: [phone]   Remember:   Do not eat food or drink liquids after midnight.   Take these medicines the morning of surgery with A SIP OF WATER: If needed: oxyCODONE-acetaminophen (PERCOCET/ROXICET) 5-325 MG per tablet,  norvasc STOP aspirin, meloxicam now              Do not wear jewelry, make-up or nail polish.  Do not wear lotions, powders, or perfumes. You may wear deodorant.  Do not shave 48 hours prior to surgery.  Do not bring valuables to the hospital.  Contacts, dentures or bridgework may not be worn into surgery.  Leave suitcase in the car. After surgery it may be brought to your room.  For patients admitted to the hospital, checkout time is 11:00 AM the day of discharge.   Patients discharged the day of surgery will not be allowed to drive home.  Name and phone number of your driver:   Special Instructions: Shower using CHG 2 nights before surgery and the night before surgery.  If you shower the day of surgery use CHG.  Use special wash - you have one bottle of CHG for all showers.  You should use approximately 1/3 of the bottle for each shower.   Please read over the following fact sheets that you were given: Pain Booklet, Coughing and Deep Breathing and Surgical Site Infection Prevention

## 2012-05-14 LAB — URINE CULTURE: Colony Count: 30000

## 2012-05-14 MED ORDER — CHLORHEXIDINE GLUCONATE 4 % EX LIQD: 60.0000 mL | Freq: Once | CUTANEOUS | Status: DC

## 2012-05-14 MED ORDER — SODIUM CHLORIDE 0.9 % IV SOLN: INTRAVENOUS | Status: DC

## 2012-05-14 MED ORDER — DEXTROSE 5 % IV SOLN
3.0000 g | INTRAVENOUS | Status: AC
  Administered 2012-05-15: 3 g via INTRAVENOUS
  Filled 2012-05-14: qty 3000

## 2012-05-14 MED ORDER — ACETAMINOPHEN 10 MG/ML IV SOLN
1000.0000 mg | Freq: Once | INTRAVENOUS | Status: DC
  Filled 2012-05-14: qty 100

## 2012-05-15 ENCOUNTER — Encounter (HOSPITAL_COMMUNITY): Payer: Self-pay

## 2012-05-15 ENCOUNTER — Encounter (HOSPITAL_COMMUNITY): Payer: Self-pay | Admitting: Certified Registered Nurse Anesthetist

## 2012-05-15 ENCOUNTER — Inpatient Hospital Stay (HOSPITAL_COMMUNITY)
Admission: RE | Admit: 2012-05-15 | Discharge: 2012-05-16 | DRG: 488 | Disposition: A | Discharge status: Home / Self Care | Payer: Medicaid Other | Source: Ambulatory Visit | Attending: Orthopedic Surgery | Admitting: Orthopedic Surgery

## 2012-05-15 ENCOUNTER — Encounter (HOSPITAL_COMMUNITY)
Admission: RE | Disposition: A | Discharge status: Home / Self Care | Payer: Self-pay | Source: Ambulatory Visit | Attending: Orthopedic Surgery

## 2012-05-15 ENCOUNTER — Inpatient Hospital Stay (HOSPITAL_COMMUNITY): Payer: Medicaid Other | Admitting: Certified Registered Nurse Anesthetist

## 2012-05-15 DIAGNOSIS — Z01812 Encounter for preprocedural laboratory examination: Secondary | ICD-10-CM

## 2012-05-15 DIAGNOSIS — Z7982 Long term (current) use of aspirin: Secondary | ICD-10-CM

## 2012-05-15 DIAGNOSIS — Z794 Long term (current) use of insulin: Secondary | ICD-10-CM

## 2012-05-15 DIAGNOSIS — S838X9A Sprain of other specified parts of unspecified knee, initial encounter: Principal | ICD-10-CM | POA: Diagnosis present

## 2012-05-15 DIAGNOSIS — M25561 Pain in right knee: Secondary | ICD-10-CM

## 2012-05-15 DIAGNOSIS — D62 Acute posthemorrhagic anemia: Secondary | ICD-10-CM | POA: Diagnosis not present

## 2012-05-15 DIAGNOSIS — E785 Hyperlipidemia, unspecified: Secondary | ICD-10-CM | POA: Diagnosis present

## 2012-05-15 DIAGNOSIS — X503XXA Overexertion from repetitive movements, initial encounter: Secondary | ICD-10-CM | POA: Diagnosis present

## 2012-05-15 DIAGNOSIS — Z79899 Other long term (current) drug therapy: Secondary | ICD-10-CM

## 2012-05-15 DIAGNOSIS — Z96659 Presence of unspecified artificial knee joint: Secondary | ICD-10-CM

## 2012-05-15 DIAGNOSIS — M1990 Unspecified osteoarthritis, unspecified site: Secondary | ICD-10-CM | POA: Diagnosis present

## 2012-05-15 DIAGNOSIS — I10 Essential (primary) hypertension: Secondary | ICD-10-CM | POA: Diagnosis present

## 2012-05-15 DIAGNOSIS — F17200 Nicotine dependence, unspecified, uncomplicated: Secondary | ICD-10-CM | POA: Diagnosis present

## 2012-05-15 DIAGNOSIS — E119 Type 2 diabetes mellitus without complications: Secondary | ICD-10-CM | POA: Diagnosis present

## 2012-05-15 HISTORY — DX: Personal history of other medical treatment: Z92.89

## 2012-05-15 HISTORY — PX: QUADRICEPS TENDON REPAIR: SHX756

## 2012-05-15 LAB — GLUCOSE, CAPILLARY
Glucose-Capillary: 78 mg/dL (ref 70–99)
Glucose-Capillary: 82 mg/dL (ref 70–99)

## 2012-05-15 LAB — HEMOGLOBIN A1C: Hgb A1c MFr Bld: 5.3 % (ref <5.7)

## 2012-05-15 SURGERY — REPAIR, TENDON, QUADRICEPS
Anesthesia: General | Site: Knee | Laterality: Right | Wound class: Clean

## 2012-05-15 MED ORDER — HYDROMORPHONE HCL PF 1 MG/ML IJ SOLN
INTRAMUSCULAR | Status: AC
  Filled 2012-05-15: qty 1

## 2012-05-15 MED ORDER — ACETAMINOPHEN 10 MG/ML IV SOLN: 1000.0000 mg | Freq: Once | INTRAVENOUS | Status: DC | PRN

## 2012-05-15 MED ORDER — MELOXICAM 7.5 MG PO TABS
7.5000 mg | ORAL_TABLET | Freq: Every day | ORAL | Status: DC
  Administered 2012-05-15 – 2012-05-16 (×2): 7.5 mg via ORAL
  Filled 2012-05-15 (×2): qty 1

## 2012-05-15 MED ORDER — MIDAZOLAM HCL 5 MG/5ML IJ SOLN
INTRAMUSCULAR | Status: DC | PRN
  Administered 2012-05-15: 2 mg via INTRAVENOUS

## 2012-05-15 MED ORDER — AMLODIPINE BESYLATE 5 MG PO TABS
5.0000 mg | ORAL_TABLET | Freq: Every day | ORAL | Status: DC
  Administered 2012-05-16: 5 mg via ORAL
  Filled 2012-05-15: qty 1

## 2012-05-15 MED ORDER — ATORVASTATIN CALCIUM 20 MG PO TABS
20.0000 mg | ORAL_TABLET | Freq: Every day | ORAL | Status: DC
  Administered 2012-05-15: 20 mg via ORAL
  Filled 2012-05-15 (×2): qty 1

## 2012-05-15 MED ORDER — BUPIVACAINE-EPINEPHRINE PF 0.25-1:200000 % IJ SOLN
INTRAMUSCULAR | Status: AC
  Filled 2012-05-15: qty 30

## 2012-05-15 MED ORDER — LIDOCAINE HCL (CARDIAC) 20 MG/ML IV SOLN
INTRAVENOUS | Status: DC | PRN
  Administered 2012-05-15: 20 mg via INTRAVENOUS

## 2012-05-15 MED ORDER — LOSARTAN POTASSIUM-HCTZ 100-25 MG PO TABS: 1.0000 | ORAL_TABLET | Freq: Every day | ORAL | Status: DC

## 2012-05-15 MED ORDER — ALUM & MAG HYDROXIDE-SIMETH 200-200-20 MG/5ML PO SUSP: 30.0000 mL | ORAL | Status: DC | PRN

## 2012-05-15 MED ORDER — ACETAMINOPHEN 650 MG RE SUPP: 650.0000 mg | Freq: Four times a day (QID) | RECTAL | Status: DC | PRN

## 2012-05-15 MED ORDER — HYDROCHLOROTHIAZIDE 25 MG PO TABS
25.0000 mg | ORAL_TABLET | Freq: Every day | ORAL | Status: DC
  Administered 2012-05-15 – 2012-05-16 (×2): 25 mg via ORAL
  Filled 2012-05-15 (×2): qty 1

## 2012-05-15 MED ORDER — ONDANSETRON HCL 4 MG PO TABS: 4.0000 mg | ORAL_TABLET | Freq: Four times a day (QID) | ORAL | Status: DC | PRN

## 2012-05-15 MED ORDER — ONDANSETRON HCL 4 MG/2ML IJ SOLN: 4.0000 mg | Freq: Once | INTRAMUSCULAR | Status: DC | PRN

## 2012-05-15 MED ORDER — SODIUM CHLORIDE 0.9 % IV SOLN
INTRAVENOUS | Status: DC
  Administered 2012-05-15: 13:00:00 via INTRAVENOUS

## 2012-05-15 MED ORDER — ONDANSETRON HCL 4 MG/2ML IJ SOLN: 4.0000 mg | Freq: Four times a day (QID) | INTRAMUSCULAR | Status: DC | PRN

## 2012-05-15 MED ORDER — SODIUM CHLORIDE 0.9 % IR SOLN
Status: DC | PRN
  Administered 2012-05-15: 1000 mL

## 2012-05-15 MED ORDER — INSULIN GLARGINE 100 UNIT/ML ~~LOC~~ SOLN
36.0000 [IU] | Freq: Every day | SUBCUTANEOUS | Status: DC
  Administered 2012-05-15: 36 [IU] via SUBCUTANEOUS
  Filled 2012-05-15 (×2): qty 0.36

## 2012-05-15 MED ORDER — LOSARTAN POTASSIUM 50 MG PO TABS
100.0000 mg | ORAL_TABLET | Freq: Every day | ORAL | Status: DC
  Administered 2012-05-15 – 2012-05-16 (×2): 100 mg via ORAL
  Filled 2012-05-15 (×2): qty 2

## 2012-05-15 MED ORDER — CHLORHEXIDINE GLUCONATE 4 % EX LIQD: 60.0000 mL | Freq: Once | CUTANEOUS | Status: DC

## 2012-05-15 MED ORDER — MENTHOL 3 MG MT LOZG: 1.0000 | LOZENGE | OROMUCOSAL | Status: DC | PRN

## 2012-05-15 MED ORDER — NEOSTIGMINE METHYLSULFATE 1 MG/ML IJ SOLN
INTRAMUSCULAR | Status: DC | PRN
  Administered 2012-05-15: 4 mg via INTRAVENOUS

## 2012-05-15 MED ORDER — HYDROMORPHONE HCL PF 1 MG/ML IJ SOLN
0.2500 mg | INTRAMUSCULAR | Status: DC | PRN
  Administered 2012-05-15 (×4): 0.5 mg via INTRAVENOUS

## 2012-05-15 MED ORDER — HYDROMORPHONE HCL PF 1 MG/ML IJ SOLN
1.0000 mg | INTRAMUSCULAR | Status: DC | PRN
  Administered 2012-05-15: 1 mg via INTRAVENOUS
  Filled 2012-05-15: qty 1

## 2012-05-15 MED ORDER — INSULIN ASPART 100 UNIT/ML ~~LOC~~ SOLN: 0.0000 [IU] | Freq: Three times a day (TID) | SUBCUTANEOUS | Status: DC

## 2012-05-15 MED ORDER — PHENYLEPHRINE HCL 10 MG/ML IJ SOLN
INTRAMUSCULAR | Status: DC | PRN
  Administered 2012-05-15 (×2): 80 ug via INTRAVENOUS
  Administered 2012-05-15 (×2): 120 ug via INTRAVENOUS

## 2012-05-15 MED ORDER — OXYCODONE HCL 5 MG PO TABS
5.0000 mg | ORAL_TABLET | ORAL | Status: DC | PRN
  Administered 2012-05-15 – 2012-05-16 (×6): 10 mg via ORAL
  Filled 2012-05-15 (×6): qty 2

## 2012-05-15 MED ORDER — ROCURONIUM BROMIDE 100 MG/10ML IV SOLN
INTRAVENOUS | Status: DC | PRN
  Administered 2012-05-15: 50 mg via INTRAVENOUS

## 2012-05-15 MED ORDER — GLYCOPYRROLATE 0.2 MG/ML IJ SOLN
INTRAMUSCULAR | Status: DC | PRN
  Administered 2012-05-15: .7 mg via INTRAVENOUS

## 2012-05-15 MED ORDER — OXYCODONE HCL ER 10 MG PO T12A
10.0000 mg | EXTENDED_RELEASE_TABLET | Freq: Two times a day (BID) | ORAL | Status: DC
  Administered 2012-05-15 – 2012-05-16 (×3): 10 mg via ORAL
  Filled 2012-05-15 (×3): qty 1

## 2012-05-15 MED ORDER — PROPOFOL 10 MG/ML IV BOLUS
INTRAVENOUS | Status: DC | PRN
  Administered 2012-05-15: 130 mg via INTRAVENOUS

## 2012-05-15 MED ORDER — LACTATED RINGERS IV SOLN
INTRAVENOUS | Status: DC | PRN
  Administered 2012-05-15 (×2): via INTRAVENOUS

## 2012-05-15 MED ORDER — METOCLOPRAMIDE HCL 10 MG PO TABS: 5.0000 mg | ORAL_TABLET | Freq: Three times a day (TID) | ORAL | Status: DC | PRN

## 2012-05-15 MED ORDER — FENTANYL CITRATE 0.05 MG/ML IJ SOLN
INTRAMUSCULAR | Status: DC | PRN
  Administered 2012-05-15: 100 ug via INTRAVENOUS

## 2012-05-15 MED ORDER — CEFAZOLIN SODIUM-DEXTROSE 2-3 GM-% IV SOLR
2.0000 g | Freq: Four times a day (QID) | INTRAVENOUS | Status: AC
  Administered 2012-05-15 (×2): 2 g via INTRAVENOUS
  Filled 2012-05-15 (×2): qty 50

## 2012-05-15 MED ORDER — METOCLOPRAMIDE HCL 5 MG/ML IJ SOLN: 5.0000 mg | Freq: Three times a day (TID) | INTRAMUSCULAR | Status: DC | PRN

## 2012-05-15 MED ORDER — EPHEDRINE SULFATE 50 MG/ML IJ SOLN
INTRAMUSCULAR | Status: DC | PRN
  Administered 2012-05-15 (×2): 10 mg via INTRAVENOUS

## 2012-05-15 MED ORDER — ACETAMINOPHEN 10 MG/ML IV SOLN
INTRAVENOUS | Status: AC
  Administered 2012-05-15: 1000 mg via INTRAVENOUS
  Filled 2012-05-15: qty 100

## 2012-05-15 MED ORDER — ONDANSETRON HCL 4 MG/2ML IJ SOLN
INTRAMUSCULAR | Status: DC | PRN
  Administered 2012-05-15: 4 mg via INTRAVENOUS

## 2012-05-15 MED ORDER — EXENATIDE 5 MCG/0.02ML ~~LOC~~ SOPN: 5.0000 ug | PEN_INJECTOR | Freq: Two times a day (BID) | SUBCUTANEOUS | Status: DC

## 2012-05-15 MED ORDER — ACETAMINOPHEN 325 MG PO TABS: 650.0000 mg | ORAL_TABLET | Freq: Four times a day (QID) | ORAL | Status: DC | PRN

## 2012-05-15 MED ORDER — ACETAMINOPHEN 10 MG/ML IV SOLN
1000.0000 mg | Freq: Four times a day (QID) | INTRAVENOUS | Status: AC
  Administered 2012-05-15 – 2012-05-16 (×4): 1000 mg via INTRAVENOUS
  Filled 2012-05-15 (×4): qty 100

## 2012-05-15 MED ORDER — PHENOL 1.4 % MT LIQD: 1.0000 | OROMUCOSAL | Status: DC | PRN

## 2012-05-15 MED ORDER — ASPIRIN EC 325 MG PO TBEC
325.0000 mg | DELAYED_RELEASE_TABLET | Freq: Every day | ORAL | Status: DC
  Administered 2012-05-16: 325 mg via ORAL
  Filled 2012-05-15 (×2): qty 1

## 2012-05-15 MED ORDER — BUPIVACAINE-EPINEPHRINE PF 0.25-1:200000 % IJ SOLN
INTRAMUSCULAR | Status: DC | PRN
  Administered 2012-05-15: 20 mL

## 2012-05-15 SURGICAL SUPPLY — 60 items
ANCHOR PEEK SWIVEL LOCK 5.5 (Anchor) ×3 IMPLANT
BANDAGE ELASTIC 6 VELCRO ST LF (GAUZE/BANDAGES/DRESSINGS) ×3 IMPLANT
BANDAGE ESMARK 6X9 LF (GAUZE/BANDAGES/DRESSINGS) IMPLANT
BIT DRILL 5/64X5 DISP (BIT) ×3 IMPLANT
BLADE CUDA 5.5 (BLADE) IMPLANT
BLADE CUTTER GATOR 3.5 (BLADE) IMPLANT
BLADE GREAT WHITE 4.2 (BLADE) ×3 IMPLANT
BNDG ESMARK 6X9 LF (GAUZE/BANDAGES/DRESSINGS)
BUR OVAL 6.0 (BURR) IMPLANT
CLOTH BEACON ORANGE TIMEOUT ST (SAFETY) ×3 IMPLANT
CUFF TOURNIQUET SINGLE 34IN LL (TOURNIQUET CUFF) IMPLANT
DRAPE ARTHROSCOPY W/POUCH 114 (DRAPES) ×3 IMPLANT
DRAPE PROXIMA HALF (DRAPES) ×3 IMPLANT
DRAPE U-SHAPE 47X51 STRL (DRAPES) ×3 IMPLANT
DRSG PAD ABDOMINAL 8X10 ST (GAUZE/BANDAGES/DRESSINGS) ×3 IMPLANT
ELECT CAUTERY BLADE 6.4 (BLADE) IMPLANT
ELECT MENISCUS 165MM 90D (ELECTRODE) IMPLANT
ELECT REM PT RETURN 9FT ADLT (ELECTROSURGICAL)
ELECTRODE REM PT RTRN 9FT ADLT (ELECTROSURGICAL) IMPLANT
GAUZE XEROFORM 1X8 LF (GAUZE/BANDAGES/DRESSINGS) ×3 IMPLANT
GAUZE XEROFORM 5X9 LF (GAUZE/BANDAGES/DRESSINGS) ×3 IMPLANT
GLOVE BIOGEL PI IND STRL 7.5 (GLOVE) IMPLANT
GLOVE BIOGEL PI IND STRL 8.5 (GLOVE) ×4 IMPLANT
GLOVE BIOGEL PI INDICATOR 7.5 (GLOVE)
GLOVE BIOGEL PI INDICATOR 8.5 (GLOVE) ×2
GLOVE SURG ORTHO 7.0 STRL STRW (GLOVE) IMPLANT
GLOVE SURG ORTHO 8.0 STRL STRW (GLOVE) ×6 IMPLANT
GOWN STRL NON-REIN LRG LVL3 (GOWN DISPOSABLE) ×9 IMPLANT
KIT ROOM TURNOVER OR (KITS) ×3 IMPLANT
MANIFOLD NEPTUNE II (INSTRUMENTS) ×3 IMPLANT
NEEDLE 18GX1X1/2 (RX/OR ONLY) (NEEDLE) ×3 IMPLANT
PACK ARTHROSCOPY DSU (CUSTOM PROCEDURE TRAY) ×3 IMPLANT
PACK ORTHO EXTREMITY (CUSTOM PROCEDURE TRAY) ×3 IMPLANT
PAD ARMBOARD 7.5X6 YLW CONV (MISCELLANEOUS) ×6 IMPLANT
PADDING CAST COTTON 6X4 STRL (CAST SUPPLIES) ×3 IMPLANT
PASSER SUT SWANSON 36MM LOOP (INSTRUMENTS) IMPLANT
PENCIL BUTTON HOLSTER BLD 10FT (ELECTRODE) IMPLANT
SET ARTHROSCOPY TUBING (MISCELLANEOUS) ×1
SET ARTHROSCOPY TUBING LN (MISCELLANEOUS) ×2 IMPLANT
SPONGE GAUZE 4X4 12PLY (GAUZE/BANDAGES/DRESSINGS) ×3 IMPLANT
SPONGE LAP 4X18 X RAY DECT (DISPOSABLE) ×3 IMPLANT
STAPLER VISISTAT 35W (STAPLE) ×3 IMPLANT
SUT ETHILON 4 0 PS 2 18 (SUTURE) ×3 IMPLANT
SUT FIBERWIRE #2 38 T-5 BLUE (SUTURE) ×6
SUT MNCRL AB 3-0 PS2 18 (SUTURE) IMPLANT
SUT VIC AB 0 CT1 27 (SUTURE) ×1
SUT VIC AB 0 CT1 27XBRD ANBCTR (SUTURE) ×2 IMPLANT
SUT VIC AB 2-0 CT1 27 (SUTURE) ×1
SUT VIC AB 2-0 CT1 36 (SUTURE) ×3 IMPLANT
SUT VIC AB 2-0 CT1 TAPERPNT 27 (SUTURE) ×2 IMPLANT
SUT VICRYL 0 CT 1 36IN (SUTURE) ×3 IMPLANT
SUTURE FIBERWR #2 38 T-5 BLUE (SUTURE) ×4 IMPLANT
SYR 30ML LL (SYRINGE) ×6 IMPLANT
TAPE FIBER 2MM 7IN #2 BLUE (SUTURE) ×15 IMPLANT
TOWEL OR 17X24 6PK STRL BLUE (TOWEL DISPOSABLE) ×3 IMPLANT
TOWEL OR 17X26 10 PK STRL BLUE (TOWEL DISPOSABLE) ×3 IMPLANT
TUBE CONNECTING 12X1/4 (SUCTIONS) ×3 IMPLANT
WAND 90 DEG TURBOVAC W/CORD (SURGICAL WAND) IMPLANT
WATER STERILE IRR 1000ML POUR (IV SOLUTION) ×3 IMPLANT
YANKAUER SUCT BULB TIP NO VENT (SUCTIONS) ×6 IMPLANT

## 2012-05-15 NOTE — Transfer of Care (Signed)
Immediate Anesthesia Transfer of Care Note  Patient: Mary [name]  Procedure(s) Performed: Procedure(s): REPAIR QUADRICEP TENDON (Right)  Patient Location: PACU  Anesthesia Type:General  Level of Consciousness: awake, alert , oriented and patient cooperative  Airway & Oxygen Therapy: Patient Spontanous Breathing and Patient connected to nasal cannula oxygen  Post-op Assessment: Report given to PACU RN, Post -op Vital signs reviewed and stable and Patient moving all extremities X 4  Post vital signs: Reviewed and stable  Complications: No apparent anesthesia complications

## 2012-05-15 NOTE — Anesthesia Preprocedure Evaluation (Addendum)
Anesthesia Evaluation  Patient identified by MRN, date of birth, ID band Patient awake    Reviewed: Allergy & Precautions, H&P , NPO status , Patient's Chart, lab work & pertinent test results  Airway Mallampati: II TM Distance: >3 FB     Dental  (+) Poor Dentition, Edentulous Upper and Dental Advisory Given   Pulmonary Current Smoker,  breath sounds clear to auscultation        Cardiovascular hypertension, Pt. on medications Rhythm:Regular Rate:Normal     Neuro/Psych    GI/Hepatic   Endo/Other  diabetes, Type 2, Insulin DependentMorbid obesity  Renal/GU      Musculoskeletal  (+) Arthritis -, Osteoarthritis,    Abdominal (+) + obese,   Peds  Hematology   Anesthesia Other Findings   Reproductive/Obstetrics                          Anesthesia Physical Anesthesia Plan  ASA: III  Anesthesia Plan: General   Post-op Pain Management:    Induction: Intravenous  Airway Management Planned: LMA and Oral ETT  Additional Equipment:   Intra-op Plan:   Post-operative Plan: Extubation in OR  Informed Consent: I have reviewed the patients History and Physical, chart, labs and discussed the procedure including the risks, benefits and alternatives for the proposed anesthesia with the patient or authorized representative who has indicated his/her understanding and acceptance.   Dental advisory given  Plan Discussed with: CRNA  Anesthesia Plan Comments:         Anesthesia Quick Evaluation

## 2012-05-15 NOTE — Progress Notes (Signed)
Right foot swollen same as pre-op dr Sherlean Foot aware

## 2012-05-15 NOTE — H&P (Signed)
Mary [name] MRN:  [id number] DOB/SEX:  May 24, 1945/female  CHIEF COMPLAINT:  Painful right Knee  HISTORY: Patient is a 67 y.o. female presented with a history of pain in the right knee. Onset of symptoms was gradual starting several weeks ago with gradually worsening course since that time. Prior procedures on the knee include arthroplasty. Patient has been treated conservatively with over-the-counter NSAIDs and activity modification. Patient currently rates pain in the knee at 8 out of 10 with activity. There is no pain at night.  Patient has had continual struggles with this knee after surgery and has had the quad repair several months ago.  Struggles with pain, weakness and ability to transfer.  Walks wih assistive device.  PAST MEDICAL HISTORY: There are no active problems to display for this patient.  Past Medical History  Diagnosis Date  . Hyperlipemia   . Arthritis     osteoarthritis  . Diabetes mellitus     Insulin dependent  . Hypertension   . H/O cardiovascular stress test     pt. reports 10/13- was normal per pt.    Past Surgical History  Procedure Laterality Date  . Carpal tunnel release Bilateral   . Knee arthroscopy      right  . 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 Total knee arthroplasty  10/21/2011    Procedure: TOTAL KNEE ARTHROPLASTY;  Surgeon: Raymon Mutton, MD;  Location: [facility] OR;  Service: Orthopedics;  Laterality: Right;  . Quadriceps tendon repair  02/17/2012    Procedure: REPAIR QUADRICEP TENDON;  Surgeon: Dannielle Huh, MD;  Location: [facility] OR;  Service: Orthopedics;  Laterality: Right;  right quadriceps repair with latera release  . Tubal ligation       MEDICATIONS:   Prescriptions prior to admission  Medication Sig Dispense Refill  . amLODipine (NORVASC) 5 MG tablet Take 5 mg by mouth daily.      [name] aspirin 325 MG EC tablet Take 325 mg by mouth daily.      [name] exenatide (BYETTA) 5 MCG/0.02ML SOLN Inject 5 mcg into the skin 2 (two) times daily  with a meal.      . insulin glargine (LANTUS) 100 UNIT/ML injection Inject 36 Units into the skin at bedtime.      [name] losartan-hydrochlorothiazide (HYZAAR) 100-25 MG per tablet Take 1 tablet by mouth daily.      . meloxicam (MOBIC) 7.5 MG tablet Take 7.5 mg by mouth daily.      [name] oxyCODONE-acetaminophen (PERCOCET/ROXICET) 5-325 MG per tablet Take 1-2 tablets by mouth every 4 (four) hours as needed. For pain  90 tablet  0  . rosuvastatin (CRESTOR) 10 MG tablet Take 10 mg by mouth daily.      . Cyanocobalamin (VITAMIN B-12) 1000 MCG SUBL Place 1,000 mcg under the tongue daily.        ALLERGIES:  No Known Allergies  REVIEW OF SYSTEMS:  Pertinent items are noted in HPI.   FAMILY HISTORY:  No family history on file.  SOCIAL HISTORY:   History  Substance Use Topics  . Smoking status: Current Every Day Smoker -- 0.25 packs/day for 20 years    Types: Cigarettes  . Smokeless tobacco: Never Used  . Alcohol Use: No     EXAMINATION:  Vital signs in last 24 hours: Temp:  [97.8 F (36.6 C)] 97.8 F (36.6 C) (05/02 0624) Pulse Rate:  [80] 80 (05/02 0624) Resp:  [18] 18 (05/02 0624) BP: (162)/(98) 162/98 mmHg (05/02 0624)  SpO2:  [98 %] 98 % (05/02 0624)  General appearance: alert, cooperative and no distress Lungs: clear to auscultation bilaterally Heart: regular rate and rhythm, S1, S2 normal, no murmur, click, rub or gallop Abdomen: soft, non-tender; bowel sounds normal; no masses,  no organomegaly Extremities: extremities normal, atraumatic, no cyanosis or edema and Homans sign is negative, no sign of DVT Pulses: 2+ and symmetric Skin: Skin color, texture, turgor normal. No rashes or lesions Neurologic: Alert and oriented X 3, normal strength and tone. Normal symmetric reflexes. Normal coordination and gait  Musculoskeletal:  ROM 0-90, Ligaments intact,  Imaging Review Plain radiographs demonstrate componets intact from arthroplasty and a severly lateral tracking  patella Assessment/Plan:  right knee pain, torn quad tendon  The patient history, physical examination and imaging studies are consistent with lateral tracking patella of the right knee. The patient has failed conservative treatment. After discussion with the patient it was felt that quad repair was indicated. The procedure,  risks, and benefits of total knee arthroplasty were presented and reviewed. The risks including but not limited to aseptic loosening, infection, blood clots, vascular injury, stiffness, patella tracking problems complications among others were discussed. The patient acknowledged the explanation, agreed to proceed with the plan.  Mary [name] 05/15/2012, 7:12 AM

## 2012-05-15 NOTE — Preoperative (Signed)
Beta Blockers   Reason not to administer Beta Blockers:Not Applicable 

## 2012-05-15 NOTE — Progress Notes (Signed)
Orthopedic Tech Progress Note Patient Details:  Mary [name] 10-03-1945 [id number] Brace Order completed by [employer]  Patient ID: [name], female   DOB: February 03, 1945, 67 y.o.   MRN: [id number]   [name] 05/15/2012, 4:16 PM

## 2012-05-15 NOTE — Anesthesia Procedure Notes (Signed)
Procedure Name: Intubation Date/Time: 05/15/2012 7:51 AM Performed by: Rogelia Boga Pre-anesthesia Checklist: Patient identified, Emergency Drugs available, Suction available, Patient being monitored and Timeout performed Patient Re-evaluated:Patient Re-evaluated prior to inductionOxygen Delivery Method: Circle system utilized Preoxygenation: Pre-oxygenation with 100% oxygen Intubation Type: IV induction Ventilation: Mask ventilation without difficulty and Oral airway inserted - appropriate to patient size Laryngoscope Size: Mac and 4 Grade View: Grade I Tube type: Oral Tube size: 7.5 mm Airway Equipment and Method: Stylet Placement Confirmation: ETT inserted through vocal cords under direct vision,  positive ETCO2 and breath sounds checked- equal and bilateral Secured at: 21 cm Tube secured with: Tape Dental Injury: Teeth and Oropharynx as per pre-operative assessment

## 2012-05-15 NOTE — Progress Notes (Signed)
Call to Dr. Sampson Goon, relative to pt. CBG- 78 this a.m. , pt.'s only complaint is feeling hunger. No new order furnished.

## 2012-05-15 NOTE — Progress Notes (Signed)
IT notifed that scanner not working

## 2012-05-16 LAB — CBC
HCT: 33 % — ABNORMAL LOW (ref 36.0–46.0)
Hemoglobin: 11.3 g/dL — ABNORMAL LOW (ref 12.0–15.0)
MCHC: 34.2 g/dL (ref 30.0–36.0)
MCV: 89.2 fL (ref 78.0–100.0)

## 2012-05-16 LAB — BASIC METABOLIC PANEL
BUN: 30 mg/dL — ABNORMAL HIGH (ref 6–23)
Creatinine, Ser: 1.3 mg/dL — ABNORMAL HIGH (ref 0.50–1.10)
GFR calc non Af Amer: 42 mL/min — ABNORMAL LOW (ref >90)
Glucose, Bld: 100 mg/dL — ABNORMAL HIGH (ref 70–99)
Potassium: 4.4 mEq/L (ref 3.5–5.1)

## 2012-05-16 LAB — GLUCOSE, CAPILLARY
Glucose-Capillary: 78 mg/dL (ref 70–99)
Glucose-Capillary: 67 mg/dL — ABNORMAL LOW (ref 70–99)

## 2012-05-16 MED ORDER — OXYCODONE HCL ER 10 MG PO T12A: 10.0000 mg | EXTENDED_RELEASE_TABLET | Freq: Two times a day (BID) | ORAL | Status: DC

## 2012-05-16 MED ORDER — OXYCODONE HCL 5 MG PO TABS: 5.0000 mg | ORAL_TABLET | ORAL | Status: DC | PRN

## 2012-05-16 NOTE — Discharge Summary (Signed)
SPORTS MEDICINE & JOINT REPLACEMENT   Mary Spurling, MD   Mary Cabal, PA-C [address] [place], [place], [place]  [address]                             [phone]  PATIENT ID: Mary [name]        MRN:  [id number]          DOB/AGE: Oct 23, 1945 / 67 y.o.    DISCHARGE SUMMARY  ADMISSION DATE:    05/15/2012 DISCHARGE DATE:   05/16/2012   ADMISSION DIAGNOSIS: medial and lateral mensicus tear and quad ruture    DISCHARGE DIAGNOSIS:  Right quad tendon ruture    ADDITIONAL DIAGNOSIS: Active Problems:   * No active hospital problems. *  Past Medical History  Diagnosis Date  . Hyperlipemia   . Arthritis     osteoarthritis  . Diabetes mellitus     Insulin dependent  . Hypertension   . H/O cardiovascular stress test     pt. reports 10/13- was normal per pt.     PROCEDURE: Procedure(s): REPAIR QUADRICEP TENDON on 05/15/2012  CONSULTS:     HISTORY:  See H&P in chart  HOSPITAL COURSE:  Mary [name] is a 67 y.o. admitted on 05/15/2012 and found to have a diagnosis of Right quad tendon ruture.  After appropriate laboratory studies were obtained  they were taken to the operating room on 05/15/2012 and underwent Procedure(s): REPAIR QUADRICEP TENDON.   They were given perioperative antibiotics:  Anti-infectives   Start     Dose/Rate Route Frequency Ordered Stop   05/15/12 1400  ceFAZolin (ANCEF) IVPB 2 g/50 mL premix     2 g 100 mL/hr over 30 Minutes Intravenous Every 6 hours 05/15/12 1206 05/15/12 2147   05/15/12 0600  ceFAZolin (ANCEF) 3 g in dextrose 5 % 50 mL IVPB     3 g 160 mL/hr over 30 Minutes Intravenous On call to O.R. 05/14/12 1308 05/15/12 0753    .  Tolerated the procedure well.  Placed with a foley intraoperatively.  Given Ofirmev at induction and for 48 hours.    POD# 1: Vital signs were stable.  Patient denied Chest pain, shortness of breath, or calf pain.  Patient was started on Lovenox 30 mg subcutaneously twice daily at 8am.  Consults to PT, OT, and care  management were made.  The patient was weight bearing as tolerated.  CPM was placed on the operative leg 0-90 degrees for 6-8 hours a day.  Incentive spirometry was taught.  Dressing was changed.  Marcaine pump and hemovac were discontinued.      POD #2, Continued  PT for ambulation and exercise program.  IV saline locked.  O2 discontinued.    The remainder of the hospital course was dedicated to ambulation and strengthening.   The patient was discharged on 1 Day Post-Op in  Good condition.  Blood products given:none  DIAGNOSTIC STUDIES: Recent vital signs: Patient Vitals for the past 24 hrs:  BP Temp Pulse Resp SpO2  05/16/12 0653 125/50 mmHg 97.6 F (36.4 C) 55 18 99 %  05/16/12 0400 - - - 18 -  05/16/12 0000 - - - 20 -  05/15/12 2251 111/43 mmHg 98.7 F (37.1 C) 78 16 99 %  05/15/12 2000 - - - 18 -  05/15/12 1208 121/46 mmHg 97.5 F (36.4 C) 74 12 100 %  05/15/12 1145 - - 73 13 100 %  05/15/12 1130  137/67 mmHg 97.5 F (36.4 C) 73 11 99 %  05/15/12 1115 - - 71 12 99 %  05/15/12 1100 145/60 mmHg - 72 18 99 %  05/15/12 1045 140/61 mmHg - 73 13 100 %  05/15/12 1030 137/58 mmHg - - 13 -  05/15/12 1015 132/55 mmHg - 78 16 98 %  05/15/12 1000 141/98 mmHg - 79 19 97 %  05/15/12 0945 132/69 mmHg 97.6 F (36.4 C) 81 15 98 %       Recent laboratory studies:  Recent Labs  05/13/12 1330 05/16/12 0430  WBC 9.0 8.3  HGB 14.1 11.3*  HCT 39.8 33.0*  PLT 287 235    Recent Labs  05/13/12 1330 05/16/12 0430  NA 142 140  K 3.9 4.4  CL 106 104  CO2 27 28  BUN 24* 30*  CREATININE 0.90 1.30*  GLUCOSE 101* 100*  CALCIUM 9.8 8.9   Lab Results  Component Value Date   INR 0.93 05/13/2012   INR 0.91 02/14/2012   INR 0.97 10/11/2011     Recent Radiographic Studies :  Ct Knee Right Wo Contrast  05/05/2012  *RADIOLOGY REPORT*  Clinical Data: Patellar dislocation.  CT OF THE RIGHT KNEE WITHOUT CONTRAST  Technique:  Multidetector CT imaging was performed according to the standard  protocol. Multiplanar CT image reconstructions were also generated.  Comparison: None.  Findings: The patella is dislocated laterally.  It is hard to be certain but I believe the some elastic patellar component is still in place.  I do not see the medial retinacular structures well because of the artifact.  The femoral and tibial components are intact.  No obvious fracture.  There is a moderate sized joint effusion noted.  The patellar tendon appears to be intact.  The quadriceps tendon is not well visualized.  IMPRESSION:  1.  Lateral dislocation of the patella. 2.  Possible quadriceps tendon rupture. 3.  Moderate sized joint effusion. 4.  The femoral and tibial components are well seated without complicating features.   Original Report Authenticated By: Rudie Meyer, M.D.     DISCHARGE INSTRUCTIONS: Discharge Orders   Future Appointments Provider Department Dept Phone   05/18/2012 10:15 AM Elizbeth Squires, PT [facility] [phone]   Future Orders Complete By Expires     Call MD / Call 911  As directed     Comments:      If you experience chest pain or shortness of breath, CALL 911 and be transported to the hospital emergency room.  If you develope a fever above 101 F, pus (white drainage) or increased drainage or redness at the wound, or calf pain, call your surgeon's office.    Change dressing  As directed     Comments:      Change dressing on sunday, then change the dressing daily with sterile 4 x 4 inch gauze dressing and apply TED hose.  You may clean the incision with alcohol prior to redressing.    Constipation Prevention  As directed     Comments:      Drink plenty of fluids.  Prune juice may be helpful.  You may use a stool softener, such as Colace (over the counter) 100 mg twice a day.  Use MiraLax (over the counter) for constipation as needed.    Diet - low sodium heart healthy  As directed     Do not put a pillow under the knee. Place it under the heel.   As directed  Driving restrictions  As directed     Comments:      No driving for 6 weeks    Increase activity slowly as tolerated  As directed     Lifting restrictions  As directed     Comments:      No lifting for 6 weeks       DISCHARGE MEDICATIONS:     Medication List    STOP taking these medications       oxyCODONE-acetaminophen 5-325 MG per tablet  Commonly known as:  PERCOCET/ROXICET      TAKE these medications       amLODipine 5 MG tablet  Commonly known as:  NORVASC  Take 5 mg by mouth daily.     aspirin 325 MG EC tablet  Take 325 mg by mouth daily.     exenatide 5 MCG/0.02ML Sopn  Commonly known as:  BYETTA  Inject 5 mcg into the skin 2 (two) times daily with a meal.     insulin glargine 100 UNIT/ML injection  Commonly known as:  LANTUS  Inject 36 Units into the skin at bedtime.     losartan-hydrochlorothiazide 100-25 MG per tablet  Commonly known as:  HYZAAR  Take 1 tablet by mouth daily.     meloxicam 7.5 MG tablet  Commonly known as:  MOBIC  Take 7.5 mg by mouth daily.     oxyCODONE 5 MG immediate release tablet  Commonly known as:  Oxy IR/ROXICODONE  Take 1-2 tablets (5-10 mg total) by mouth every 3 (three) hours as needed.     OxyCODONE 10 mg T12a  Commonly known as:  OXYCONTIN  Take 1 tablet (10 mg total) by mouth every 12 (twelve) hours.     rosuvastatin 10 MG tablet  Commonly known as:  CRESTOR  Take 10 mg by mouth daily.     Vitamin B-12 1000 MCG Subl  Place 1,000 mcg under the tongue daily.        FOLLOW UP VISIT:       Follow-up Information   Follow up with Raymon Mutton, MD. Call on 05/26/2012.   Contact information:   [address] [place] [address] [phone]       DISPOSITION: HOME   CONDITION:  Good   Mary [name] 05/16/2012, 9:22 AM

## 2012-05-16 NOTE — Evaluation (Signed)
Physical Therapy Evaluation Patient Details Name: Mary [name] MRN: [id number] DOB: 14-May-1945 Today's Date: 05/16/2012 Time: 4098-1191 PT Time Calculation (min): 24 min  PT Assessment / Plan / Recommendation Clinical Impression   67 y/o obese female s/p repair of quad tendon. Pt has had multiple procedures and is familiar with mobility status post quad repair.  Pt would benefit from bariatric RW at home.  Pt to DC to home today. Would benefit from short term HHPT but not sure if she is eligible based on her insurance.  Pt with no further questions for PT and states she feels ready for DC home.         PT Assessment   (pt to DC home today.  )    Follow Up Recommendations   HHPT if eligible to receive    Does the patient have the potential to tolerate intense rehabilitation      Barriers to Discharge        Equipment Recommendations  Rolling walker with 5" wheels (bariatric)    Recommendations for Other Services     Frequency      Precautions / Restrictions Precautions Required Braces or Orthoses: Knee Immobilizer - Right Knee Immobilizer - Right: On at all times Restrictions Weight Bearing Restrictions: Yes RLE Weight Bearing: Weight bearing as tolerated Other Position/Activity Restrictions: No ROM RLE   Pertinent Vitals/Pain 8/10 pain in RLE.  RN gave pain meds at start of session.        Mobility  Bed Mobility Bed Mobility: Not assessed (pt up in chair, reports no difficulty with bed mobility) Transfers Transfers: Sit to Stand;Stand to Sit Sit to Stand: 4: Min assist;With armrests;From chair/3-in-1 Stand to Sit: 4: Min guard;With armrests;To chair/3-in-1 Details for Transfer Assistance: cues to not bend RLE and have it out in front with transfers Ambulation/Gait Ambulation/Gait Assistance: 4: Min guard Ambulation Distance (Feet): 50 Feet Assistive device: Rolling walker Gait Pattern: Step-to pattern Gait velocity: decreased General Gait Details: pt flexed at  hips with gait Stairs: No (Pt able to verbally describe going up and down stairs safely)    Exercises     PT Diagnosis:    PT Problem List:   PT Treatment Interventions:     PT Goals    Visit Information  Last PT Received On: 05/16/12 Assistance Needed: +1    Subjective Data  Patient Stated Goal: to go home today   Prior Functioning  Home Living Lives With: Daughter Available Help at Discharge: Family;Available 24 hours/day Type of Home: House Home Access: Stairs to enter [employer] of Steps: 1 Home Layout: One level Home Adaptive Equipment: Walker - rolling;Bedside commode/3-in-1 (pt reposrt walker is old and too small for her) Prior Function Level of Independence: Needs assistance Needs Assistance:  (Needs assist with homemaking) Able to Take Stairs?: Yes Communication Communication: No difficulties    Cognition  Cognition Arousal/Alertness: Awake/alert Behavior During Therapy: WFL for tasks assessed/performed Overall Cognitive Status: Within Functional Limits for tasks assessed    Extremity/Trunk Assessment Right Upper Extremity Assessment RUE ROM/Strength/Tone: [facility] for tasks assessed Left Upper Extremity Assessment LUE ROM/Strength/Tone: [facility] for tasks assessed Right Lower Extremity Assessment RLE ROM/Strength/Tone: Deficits;Unable to fully assess (pt in knee immobilizer) RLE ROM/Strength/Tone Deficits: limited, in knee immobilzer Left Lower Extremity Assessment LLE ROM/Strength/Tone: WFL for tasks assessed   Balance    End of Session PT - End of Session Equipment Utilized During Treatment: Gait belt;Right knee immobilizer Activity Tolerance: Patient tolerated treatment well Patient left: in chair;with call bell/phone  within reach Nurse Communication: Mobility status (need for bariartic RW)  GP     [name] 05/16/2012, 10:15 AM Lavona Mound, PT  [phone] 05/16/2012

## 2012-05-16 NOTE — Op Note (Signed)
Dictation Number: [id number]

## 2012-05-16 NOTE — Progress Notes (Signed)
SPORTS MEDICINE AND JOINT REPLACEMENT  [name], MD   Altamese Cabal, PA-C [address] [place], [place], [place]  [address]                             [phone]   PROGRESS NOTE  Subjective:  negative for Chest Pain  negative for Shortness of Breath  negative for Nausea/Vomiting   negative for Calf Pain  negative for Bowel Movement   Tolerating Diet: yes         Patient reports pain as 5 on 0-10 scale.    Objective: Vital signs in last 24 hours:   Patient Vitals for the past 24 hrs:  BP Temp Pulse Resp SpO2  05/16/12 0653 125/50 mmHg 97.6 F (36.4 C) 55 18 99 %  05/16/12 0400 - - - 18 -  05/16/12 0000 - - - 20 -  05/15/12 2251 111/43 mmHg 98.7 F (37.1 C) 78 16 99 %  05/15/12 2000 - - - 18 -  05/15/12 1208 121/46 mmHg 97.5 F (36.4 C) 74 12 100 %  05/15/12 1145 - - 73 13 100 %  05/15/12 1130 137/67 mmHg 97.5 F (36.4 C) 73 11 99 %  05/15/12 1115 - - 71 12 99 %  05/15/12 1100 145/60 mmHg - 72 18 99 %  05/15/12 1045 140/61 mmHg - 73 13 100 %  05/15/12 1030 137/58 mmHg - - 13 -  05/15/12 1015 132/55 mmHg - 78 16 98 %  05/15/12 1000 141/98 mmHg - 79 19 97 %  05/15/12 0945 132/69 mmHg 97.6 F (36.4 C) 81 15 98 %    @flow {1959:LAST@   Intake/Output from previous day:   05/02 0701 - 05/03 0700 In: 3380 [P.O.:480; I.V.:2900] Out: -    Intake/Output this shift:       Intake/Output     05/02 0701 - 05/03 0700 05/03 0701 - 05/04 0700   P.O. 480    I.V. 2900    Total Intake 3380     Net +3380          Urine Occurrence 4 x       LABORATORY DATA:  Recent Labs  05/13/12 1330 05/16/12 0430  WBC 9.0 8.3  HGB 14.1 11.3*  HCT 39.8 33.0*  PLT 287 235    Recent Labs  05/13/12 1330 05/16/12 0430  NA 142 140  K 3.9 4.4  CL 106 104  CO2 27 28  BUN 24* 30*  CREATININE 0.90 1.30*  GLUCOSE 101* 100*  CALCIUM 9.8 8.9   Lab Results  Component Value Date   INR 0.93 05/13/2012   INR 0.91 02/14/2012   INR 0.97 10/11/2011    Examination:  General  appearance: alert, cooperative and no distress Extremities: Homans sign is negative, no sign of DVT  Wound Exam: clean, dry, intact   Drainage:  None: wound tissue dry  Motor Exam: EHL and FHL Intact  Sensory Exam: Deep Peroneal normal   Assessment:    1 Day Post-Op  Procedure(s) (LRB): REPAIR QUADRICEP TENDON (Right)  ADDITIONAL DIAGNOSIS:  Active Problems:   * No active hospital problems. *  Acute Blood Loss Anemia   Plan: Physical Therapy as ordered Weight Bearing as Tolerated (WBAT)  DVT Prophylaxis:  Aspirin  DISCHARGE PLAN: Home  DISCHARGE NEEDS: Walker, 3-in-1 comode seat and immobilizer         Taequan Stockhausen 05/16/2012, 9:13 AM

## 2012-05-17 NOTE — Op Note (Signed)
[name], [name]              ACCOUNT NO.:  [id number]  MEDICAL RECORD NO.:  [id number]  LOCATION:  5N05C                        FACILITY:  [facility]  PHYSICIAN:  Mila Homer. Sherlean Foot, M.D. DATE OF BIRTH:  1946/01/15  DATE OF PROCEDURE:  05/15/2012 DATE OF DISCHARGE:  05/16/2012                              OPERATIVE REPORT   SURGEON:  Mila Homer. Sherlean Foot, M.D.  ASSISTANT:  Altamese Cabal, PA-C.  ANESTHESIA:  General.  PREOPERATIVE DIAGNOSIS:  Right knee VMO/quadriceps rupture.  POSTOPERATIVE DIAGNOSIS:  Right knee VMO/quadriceps rupture.[name]  PROCEDURE:  Right knee open lateral release and medial reefing/quad repair.  INDICATION FOR PROCEDURE:  The patient is a 67 year old white female with status post knee replacement possibly 6 months ago.  She ruptured her VMO in therapy at 6 weeks, went back and re-repaired that area. Unfortunately, she had stretched that out again and the patella was subluxed and she was necessitating return to the operating room today. Informed consent was obtained.  DESCRIPTION OF PROCEDURE:  The patient was laid supine and administered general anesthesia.  Right leg was prepped and draped in usual fashion. Next, after sterile prep and drape, the extremity was exsanguinated with the Esmarch and tourniquet was inflated to 375 mmHg.  Then, using #10 blade to make my previous incision.  We used a new blade to create my planes.  Really what was remarkable was that the VMO muscle was just absolutely atrophied.  There was actually no rerupture, but just the VMO was actually been stretched out.  I then made an incision in the quad as I would for total knee replacement.  I then excised about a 1 cm tissue, which was identified as scar tissue.  I then went in from the ulnar side and performed a lateral release as there was in fact some scar tissue there adhering the lateral structures and potentially aiding to patellar subluxation.  I was easily able to get the  patella back centrally tracking in the trochlear groove.  I then used a series of 5 Arthrex FiberTapes to create 2 horizontal mattress sutures imbricating the VMO and medial structures over 1 cm overlapping and then a row of vertical and horizontal #2 FiberWire.  This supported excellent repair.  The patella tracked through an arc of 0-90 degrees.  I then lavaged and closed with buried 0 Vicryl, subcu 2-0 Vicryl, and skin staples.  Dressed with Xeroform dressing, sponges, sterile Webril, and a Ace wrap. Complications none.  Drains none.  Tourniquet time 35 minutes.          ______________________________ Mila Homer Sherlean Foot, M.D.     SDL/MEDQ  D:  05/16/2012  T:  05/17/2012  Job:  [id number]

## 2012-05-18 ENCOUNTER — Encounter (HOSPITAL_COMMUNITY): Payer: Self-pay | Admitting: Orthopedic Surgery

## 2012-05-18 ENCOUNTER — Ambulatory Visit: Payer: Medicaid Other | Admitting: Physical Therapy

## 2012-05-18 NOTE — Progress Notes (Signed)
   CARE MANAGEMENT NOTE 05/18/2012  Patient:  CHRYSTA, FULCHER   Account Number:  [id number]  Date Initiated:  05/18/2012  Documentation initiated by:  [facility]  Subjective/Objective Assessment:     Action/Plan:   HH PT if covered by insurance   Anticipated DC Date:  05/16/2012   Anticipated DC Plan:  HOME W HOME HEALTH SERVICES      DC Planning Services  CM consult      Choice offered to / List presented to:             Status of service:  Completed, signed off Medicare Important Message given?   (If response is "NO", the following Medicare IM given date fields will be blank) Date Medicare IM given:   Date Additional Medicare IM given:    Discharge Disposition:  HOME/SELF CARE  Per UR Regulation:    If discussed at Long Length of Stay Meetings, dates discussed:    Comments:  05/18/2012 1500 NCM did call to follow up with pt for [facility] PT. Pt states she wants to save her [facility] PT visits as they are limited with Medicaid. States she is doing well at home. She is using her DME at home. She will follow up with her physician if she has any issues or problems. Isidoro Donning RN CCM Case Mgmt phone [phone]

## 2012-08-05 ENCOUNTER — Encounter (HOSPITAL_COMMUNITY): Payer: Self-pay | Admitting: *Deleted

## 2012-08-05 ENCOUNTER — Emergency Department (HOSPITAL_COMMUNITY)
Admission: EM | Admit: 2012-08-05 | Discharge: 2012-08-05 | Disposition: A | Discharge status: Home / Self Care | Payer: Medicaid Other | Attending: Emergency Medicine | Admitting: Emergency Medicine

## 2012-08-05 DIAGNOSIS — R42 Dizziness and giddiness: Secondary | ICD-10-CM | POA: Insufficient documentation

## 2012-08-05 DIAGNOSIS — R0982 Postnasal drip: Secondary | ICD-10-CM | POA: Insufficient documentation

## 2012-08-05 DIAGNOSIS — M129 Arthropathy, unspecified: Secondary | ICD-10-CM | POA: Insufficient documentation

## 2012-08-05 DIAGNOSIS — E119 Type 2 diabetes mellitus without complications: Secondary | ICD-10-CM | POA: Insufficient documentation

## 2012-08-05 DIAGNOSIS — E785 Hyperlipidemia, unspecified: Secondary | ICD-10-CM | POA: Insufficient documentation

## 2012-08-05 DIAGNOSIS — R110 Nausea: Secondary | ICD-10-CM | POA: Insufficient documentation

## 2012-08-05 DIAGNOSIS — Z79899 Other long term (current) drug therapy: Secondary | ICD-10-CM | POA: Insufficient documentation

## 2012-08-05 DIAGNOSIS — Z9189 Other specified personal risk factors, not elsewhere classified: Secondary | ICD-10-CM | POA: Insufficient documentation

## 2012-08-05 DIAGNOSIS — I10 Essential (primary) hypertension: Secondary | ICD-10-CM | POA: Insufficient documentation

## 2012-08-05 DIAGNOSIS — R05 Cough: Secondary | ICD-10-CM | POA: Insufficient documentation

## 2012-08-05 DIAGNOSIS — Z794 Long term (current) use of insulin: Secondary | ICD-10-CM | POA: Insufficient documentation

## 2012-08-05 DIAGNOSIS — F17200 Nicotine dependence, unspecified, uncomplicated: Secondary | ICD-10-CM | POA: Insufficient documentation

## 2012-08-05 DIAGNOSIS — R6883 Chills (without fever): Secondary | ICD-10-CM | POA: Insufficient documentation

## 2012-08-05 DIAGNOSIS — J3489 Other specified disorders of nose and nasal sinuses: Secondary | ICD-10-CM | POA: Insufficient documentation

## 2012-08-05 DIAGNOSIS — Z7982 Long term (current) use of aspirin: Secondary | ICD-10-CM | POA: Insufficient documentation

## 2012-08-05 DIAGNOSIS — J329 Chronic sinusitis, unspecified: Secondary | ICD-10-CM | POA: Insufficient documentation

## 2012-08-05 DIAGNOSIS — R059 Cough, unspecified: Secondary | ICD-10-CM | POA: Insufficient documentation

## 2012-08-05 LAB — CBC WITH DIFFERENTIAL/PLATELET
Basophils Absolute: 0 10*3/uL (ref 0.0–0.1)
Basophils Relative: 0 % (ref 0–1)
Eosinophils Absolute: 0 10*3/uL (ref 0.0–0.7)
HCT: 38.2 % (ref 36.0–46.0)
Hemoglobin: 13.3 g/dL (ref 12.0–15.0)
MCH: 31.1 pg (ref 26.0–34.0)
MCHC: 34.8 g/dL (ref 30.0–36.0)
Monocytes Absolute: 0.6 10*3/uL (ref 0.1–1.0)
Monocytes Relative: 6 % (ref 3–12)
Neutro Abs: 7 10*3/uL (ref 1.7–7.7)
RDW: 12.6 % (ref 11.5–15.5)

## 2012-08-05 LAB — COMPREHENSIVE METABOLIC PANEL
Albumin: 3.7 g/dL (ref 3.5–5.2)
BUN: 13 mg/dL (ref 6–23)
Creatinine, Ser: 0.76 mg/dL (ref 0.50–1.10)
Total Bilirubin: 1 mg/dL (ref 0.3–1.2)
Total Protein: 7.9 g/dL (ref 6.0–8.3)

## 2012-08-05 LAB — POCT I-STAT TROPONIN I: Troponin i, poc: 0.02 ng/mL (ref 0.00–0.08)

## 2012-08-05 LAB — URINALYSIS W MICROSCOPIC + REFLEX CULTURE
Bilirubin Urine: NEGATIVE
Ketones, ur: NEGATIVE mg/dL
Nitrite: NEGATIVE
Protein, ur: 100 mg/dL — AB
Urobilinogen, UA: 2 mg/dL — ABNORMAL HIGH (ref 0.0–1.0)

## 2012-08-05 MED ORDER — LORAZEPAM 2 MG/ML IJ SOLN
0.5000 mg | Freq: Once | INTRAMUSCULAR | Status: AC
  Administered 2012-08-05: 0.5 mg via INTRAVENOUS
  Filled 2012-08-05: qty 1

## 2012-08-05 MED ORDER — MECLIZINE HCL 50 MG PO TABS: 50.0000 mg | ORAL_TABLET | Freq: Three times a day (TID) | ORAL | Status: DC | PRN

## 2012-08-05 MED ORDER — FLUTICASONE PROPIONATE 50 MCG/ACT NA SUSP: 2.0000 | Freq: Every day | NASAL | Status: DC

## 2012-08-05 MED ORDER — OXYCODONE-ACETAMINOPHEN 5-325 MG PO TABS
1.0000 | ORAL_TABLET | Freq: Once | ORAL | Status: AC
  Administered 2012-08-05: 1 via ORAL
  Filled 2012-08-05: qty 1

## 2012-08-05 MED ORDER — MECLIZINE HCL 25 MG PO TABS
50.0000 mg | ORAL_TABLET | Freq: Once | ORAL | Status: AC
  Administered 2012-08-05: 50 mg via ORAL
  Filled 2012-08-05: qty 2

## 2012-08-05 MED ORDER — LORAZEPAM 2 MG/ML IJ SOLN: 0.5000 mg | Freq: Once | INTRAMUSCULAR | Status: DC

## 2012-08-05 NOTE — ED Notes (Signed)
Pt states started having sinus congestion Saturday, also having dizziness and nausea, pt states having clear drainage from nose and when coughing.

## 2012-08-05 NOTE — ED Provider Notes (Signed)
History    CSN: [id number] Arrival date & time 08/05/12  1156  First MD Initiated Contact with Patient 08/05/12 1204     Chief Complaint  Patient presents with  . Recurrent Sinusitis  . Dizziness  . Nausea   (Consider location/radiation/quality/duration/timing/severity/associated sxs/prior Treatment) HPI Mary [name] is a 67 y.o. female who presents to ED with nasal congestion, chills, cough, dizziness. States nasal and congestion for several weeks, dizziness started today. denies room spinning. Denies fever, admits to chills. Tried mucinex for symptoms with no improvement. States loss of appetite, has not taken her insulin 2 days because not eating much. Hx of sinus infections, never has had dizziness this bad. Denies taking any medications.   Past Medical History  Diagnosis Date  . Hyperlipemia   . Arthritis     osteoarthritis  . Diabetes mellitus     Insulin dependent  . Hypertension   . H/O cardiovascular stress test     pt. reports 10/13- was normal per pt.    Past Surgical History  Procedure Laterality Date  . Carpal tunnel release Bilateral   . Knee arthroscopy      right  . 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 Total knee arthroplasty  10/21/2011    Procedure: TOTAL KNEE ARTHROPLASTY;  Surgeon: Raymon Mutton, MD;  Location: [facility] OR;  Service: Orthopedics;  Laterality: Right;  . Quadriceps tendon repair  02/17/2012    Procedure: REPAIR QUADRICEP TENDON;  Surgeon: Dannielle Huh, MD;  Location: [facility] OR;  Service: Orthopedics;  Laterality: Right;  right quadriceps repair with latera release  . Tubal ligation    . Quadriceps tendon repair Right 05/15/2012    Procedure: REPAIR QUADRICEP TENDON;  Surgeon: Dannielle Huh, MD;  Location: [facility] OR;  Service: Orthopedics;  Laterality: Right;   No family history on file. History  Substance Use Topics  . Smoking status: Current Every Day Smoker -- 0.25 packs/day for 20 years    Types: Cigarettes  . Smokeless tobacco: Never  Used  . Alcohol Use: No   OB History   Grav Para Term Preterm Abortions TAB SAB Ect Mult Living                 Review of Systems  Constitutional: Negative for fever and chills.  HENT: Positive for congestion and postnasal drip. Negative for sore throat, neck pain and neck stiffness.   Eyes: Negative for visual disturbance.  Respiratory: Negative.   Cardiovascular: Negative.   Gastrointestinal: Negative.   Genitourinary: Negative for dysuria.  Musculoskeletal: Negative.   Skin: Negative.   Neurological: Positive for dizziness and light-headedness. Negative for weakness, numbness and headaches.  All other systems reviewed and are negative.    Allergies  Review of patient's allergies indicates no known allergies.  Home Medications   Current Outpatient Rx  Name  Route  Sig  Dispense  Refill  . losartan-hydrochlorothiazide (HYZAAR) 100-25 MG per tablet   Oral   Take 1 tablet by mouth daily.         [name] amLODipine (NORVASC) 5 MG tablet   Oral   Take 5 mg by mouth daily.         [name] aspirin 325 MG EC tablet   Oral   Take 325 mg by mouth daily.         . Cyanocobalamin (VITAMIN B-12) 1000 MCG SUBL   Sublingual   Place 1,000 mcg under the tongue daily.         [name]  exenatide (BYETTA) 5 MCG/0.02ML SOLN   Subcutaneous   Inject 5 mcg into the skin 2 (two) times daily with a meal.         . insulin glargine (LANTUS) 100 UNIT/ML injection   Subcutaneous   Inject 36 Units into the skin at bedtime.         . meloxicam (MOBIC) 7.5 MG tablet   Oral   Take 7.5 mg by mouth daily.         [name] oxyCODONE (OXY IR/ROXICODONE) 5 MG immediate release tablet   Oral   Take 1-2 tablets (5-10 mg total) by mouth every 3 (three) hours as needed.   90 tablet   0   . OxyCODONE (OXYCONTIN) 10 mg T12A   Oral   Take 1 tablet (10 mg total) by mouth every 12 (twelve) hours.   60 tablet   0   . rosuvastatin (CRESTOR) 10 MG tablet   Oral   Take 10 mg by mouth daily.           BP 146/93  Pulse 93  Temp(Src) 98.2 F (36.8 C) (Oral)  Resp 20  SpO2 99% Physical Exam  Nursing note and vitals reviewed. Constitutional: She is oriented to person, place, and time. She appears well-developed and well-nourished. No distress.  HENT:  Head: Normocephalic and atraumatic.  Right Ear: External ear normal.  Left Ear: External ear normal.  Nose: Nose normal.  Mouth/Throat: Oropharynx is clear and moist.  No sinus tenderness  Eyes: Conjunctivae are normal.  Neck: Normal range of motion. Neck supple.  Cardiovascular: Normal rate, regular rhythm and normal heart sounds.   Pulmonary/Chest: Effort normal and breath sounds normal. No respiratory distress. She has no wheezes. She has no rales.  Abdominal: Soft. Bowel sounds are normal. She exhibits no distension. There is no tenderness. There is no rebound.  Musculoskeletal: She exhibits no edema.  Neurological: She is alert and oriented to person, place, and time.  5/5 and equal upper and lower extremity strength bilaterally. Equal grip strength bilaterally. Normal finger to nose and heel to shin. No pronator drift. No nystagmus  Skin: Skin is warm and dry.    ED Course  Procedures (including critical care time) Results for orders placed during the hospital encounter of 08/05/12  CBC WITH DIFFERENTIAL      Result Value Range   WBC 9.3  4.0 - 10.5 K/uL   RBC 4.28  3.87 - 5.11 MIL/uL   Hemoglobin 13.3  12.0 - 15.0 g/dL   HCT 14.7  82.9 - 56.2 %   MCV 89.3  78.0 - 100.0 fL   MCH 31.1  26.0 - 34.0 pg   MCHC 34.8  30.0 - 36.0 g/dL   RDW 13.0  86.5 - 78.4 %   Platelets 290  150 - 400 K/uL   Neutrophils Relative % 76  43 - 77 %   Neutro Abs 7.0  1.7 - 7.7 K/uL   Lymphocytes Relative 18  12 - 46 %   Lymphs Abs 1.7  0.7 - 4.0 K/uL   Monocytes Relative 6  3 - 12 %   Monocytes Absolute 0.6  0.1 - 1.0 K/uL   Eosinophils Relative 0  0 - 5 %   Eosinophils Absolute 0.0  0.0 - 0.7 K/uL   Basophils Relative 0  0 - 1 %    Basophils Absolute 0.0  0.0 - 0.1 K/uL  URINALYSIS W MICROSCOPIC + REFLEX CULTURE      Result Value  Range   Color, Urine YELLOW  YELLOW   APPearance CLEAR  CLEAR   Specific Gravity, Urine 1.021  1.005 - 1.030   pH 6.0  5.0 - 8.0   Glucose, UA NEGATIVE  NEGATIVE mg/dL   Hgb urine dipstick SMALL (*) NEGATIVE   Bilirubin Urine NEGATIVE  NEGATIVE   Ketones, ur NEGATIVE  NEGATIVE mg/dL   Protein, ur 629 (*) NEGATIVE mg/dL   Urobilinogen, UA 2.0 (*) 0.0 - 1.0 mg/dL   Nitrite NEGATIVE  NEGATIVE   Leukocytes, UA NEGATIVE  NEGATIVE   RBC / HPF 0-2  <3 RBC/hpf   Bacteria, UA RARE  RARE  GLUCOSE, CAPILLARY      Result Value Range   Glucose-Capillary 133 (*) 70 - 99 mg/dL  COMPREHENSIVE METABOLIC PANEL      Result Value Range   Sodium 139  135 - 145 mEq/L   Potassium 3.5  3.5 - 5.1 mEq/L   Chloride 99  96 - 112 mEq/L   CO2 29  19 - 32 mEq/L   Glucose, Bld 133 (*) 70 - 99 mg/dL   BUN 13  6 - 23 mg/dL   Creatinine, Ser 5.28  0.50 - 1.10 mg/dL   Calcium 41.3  8.4 - 24.4 mg/dL   Total Protein 7.9  6.0 - 8.3 g/dL   Albumin 3.7  3.5 - 5.2 g/dL   AST 20  0 - 37 U/L   ALT 11  0 - 35 U/L   Alkaline Phosphatase 96  39 - 117 U/L   Total Bilirubin 1.0  0.3 - 1.2 mg/dL   GFR calc non Af Amer 87 (*) >90 mL/min   GFR calc Af Amer >90  >90 mL/min  POCT I-STAT TROPONIN I      Result Value Range   Troponin i, poc 0.02  0.00 - 0.08 ng/mL   Comment 3            No results found.   1. Sinusitis   2. Vertigo     MDM  Pt with dizziness, nasal congestion, sinus pressure, here with dizziness, which she describes as room is spinning. Pt has no hx of cva. Work up unremarkable.  Discussed with Dr. Patria Mane. Do think pt has risk factors for central vertigo, she has no other nero deficits. She was given meclizine in ED and ativan 0.5mg  IV in Ed. Her symptoms resolved. She was able to get up and walk with her walker with no dizziness, only complaining of right knee pain which she has history of. Pt also ate in  ED with no nausea or vomiting. She is in no distress. At this time doubt Ccentral vertigo as cause of her symptoms. Continue aspirin at home and close follow up with pcp.   Filed Vitals:   08/05/12 1211 08/05/12 1321 08/05/12 1644  BP: 146/93  152/64  Pulse: 93  86  Temp: 98.2 F (36.8 C) 98.8 F (37.1 C)   TempSrc: Oral    Resp: 20  13  SpO2: 99%  94%     Gurshaan Matsuoka A Khaled Herda, PA-C 08/05/12 1708

## 2012-08-05 NOTE — ED Notes (Signed)
Pt tried to use the bedpan but was unsuccessful.

## 2012-08-05 NOTE — ED Notes (Signed)
Per EMS pt from home, pt complaining of congestion and runny nose, chills, pt states has 4-5 sinus infections a year, states these are same symptoms, pt wants an antibiotic. BP 150/, CBG 118, HR 102, 98% RA, RR 20.

## 2012-08-06 NOTE — ED Provider Notes (Signed)
Medical screening examination/treatment/procedure(s) were performed by non-physician practitioner and as supervising physician I was immediately available for consultation/collaboration.   Lyanne Co, MD 08/06/12 1322

## 2013-08-17 ENCOUNTER — Other Ambulatory Visit: Payer: Self-pay

## 2013-08-17 DIAGNOSIS — Z1231 Encounter for screening mammogram for malignant neoplasm of breast: Secondary | ICD-10-CM

## 2013-09-07 ENCOUNTER — Ambulatory Visit: Payer: Medicaid Other

## 2013-09-10 ENCOUNTER — Encounter (INDEPENDENT_AMBULATORY_CARE_PROVIDER_SITE_OTHER): Payer: Self-pay

## 2013-09-10 ENCOUNTER — Ambulatory Visit
Admission: RE | Admit: 2013-09-10 | Discharge: 2013-09-10 | Disposition: A | Discharge status: Home / Self Care | Payer: Medicare HMO | Source: Ambulatory Visit

## 2013-09-10 DIAGNOSIS — Z1231 Encounter for screening mammogram for malignant neoplasm of breast: Secondary | ICD-10-CM

## 2014-03-09 ENCOUNTER — Encounter (INDEPENDENT_AMBULATORY_CARE_PROVIDER_SITE_OTHER): Payer: Self-pay | Admitting: Ophthalmology

## 2014-03-16 ENCOUNTER — Encounter (INDEPENDENT_AMBULATORY_CARE_PROVIDER_SITE_OTHER): Payer: Medicare HMO | Admitting: Ophthalmology

## 2014-03-16 DIAGNOSIS — E11331 Type 2 diabetes mellitus with moderate nonproliferative diabetic retinopathy with macular edema: Secondary | ICD-10-CM | POA: Diagnosis not present

## 2014-03-16 DIAGNOSIS — E11311 Type 2 diabetes mellitus with unspecified diabetic retinopathy with macular edema: Secondary | ICD-10-CM

## 2014-03-16 DIAGNOSIS — H34812 Central retinal vein occlusion, left eye: Secondary | ICD-10-CM | POA: Diagnosis not present

## 2014-03-16 DIAGNOSIS — H35033 Hypertensive retinopathy, bilateral: Secondary | ICD-10-CM

## 2014-03-16 DIAGNOSIS — E11339 Type 2 diabetes mellitus with moderate nonproliferative diabetic retinopathy without macular edema: Secondary | ICD-10-CM | POA: Diagnosis not present

## 2014-03-16 DIAGNOSIS — I10 Essential (primary) hypertension: Secondary | ICD-10-CM

## 2014-03-16 DIAGNOSIS — H2513 Age-related nuclear cataract, bilateral: Secondary | ICD-10-CM | POA: Diagnosis not present

## 2014-03-16 DIAGNOSIS — H43813 Vitreous degeneration, bilateral: Secondary | ICD-10-CM

## 2014-04-06 ENCOUNTER — Encounter (INDEPENDENT_AMBULATORY_CARE_PROVIDER_SITE_OTHER): Payer: Commercial Managed Care - HMO | Admitting: Ophthalmology

## 2014-04-06 DIAGNOSIS — E11331 Type 2 diabetes mellitus with moderate nonproliferative diabetic retinopathy with macular edema: Secondary | ICD-10-CM

## 2014-04-06 DIAGNOSIS — H34812 Central retinal vein occlusion, left eye: Secondary | ICD-10-CM | POA: Diagnosis not present

## 2014-04-06 DIAGNOSIS — E11339 Type 2 diabetes mellitus with moderate nonproliferative diabetic retinopathy without macular edema: Secondary | ICD-10-CM | POA: Diagnosis not present

## 2014-04-06 DIAGNOSIS — H35033 Hypertensive retinopathy, bilateral: Secondary | ICD-10-CM | POA: Diagnosis not present

## 2014-04-06 DIAGNOSIS — H43813 Vitreous degeneration, bilateral: Secondary | ICD-10-CM | POA: Diagnosis not present

## 2014-04-06 DIAGNOSIS — I10 Essential (primary) hypertension: Secondary | ICD-10-CM

## 2014-04-06 DIAGNOSIS — E11311 Type 2 diabetes mellitus with unspecified diabetic retinopathy with macular edema: Secondary | ICD-10-CM

## 2014-05-04 ENCOUNTER — Encounter (INDEPENDENT_AMBULATORY_CARE_PROVIDER_SITE_OTHER): Payer: Commercial Managed Care - HMO | Admitting: Ophthalmology

## 2014-05-04 DIAGNOSIS — H43813 Vitreous degeneration, bilateral: Secondary | ICD-10-CM

## 2014-05-04 DIAGNOSIS — E11331 Type 2 diabetes mellitus with moderate nonproliferative diabetic retinopathy with macular edema: Secondary | ICD-10-CM

## 2014-05-04 DIAGNOSIS — H34812 Central retinal vein occlusion, left eye: Secondary | ICD-10-CM | POA: Diagnosis not present

## 2014-05-04 DIAGNOSIS — E11311 Type 2 diabetes mellitus with unspecified diabetic retinopathy with macular edema: Secondary | ICD-10-CM | POA: Diagnosis not present

## 2014-05-04 DIAGNOSIS — H35033 Hypertensive retinopathy, bilateral: Secondary | ICD-10-CM

## 2014-05-04 DIAGNOSIS — I10 Essential (primary) hypertension: Secondary | ICD-10-CM | POA: Diagnosis not present

## 2014-05-04 DIAGNOSIS — H2513 Age-related nuclear cataract, bilateral: Secondary | ICD-10-CM

## 2014-05-04 DIAGNOSIS — E11329 Type 2 diabetes mellitus with mild nonproliferative diabetic retinopathy without macular edema: Secondary | ICD-10-CM | POA: Diagnosis not present

## 2014-05-24 DIAGNOSIS — S76111A Strain of right quadriceps muscle, fascia and tendon, initial encounter: Secondary | ICD-10-CM | POA: Insufficient documentation

## 2014-06-01 ENCOUNTER — Encounter (INDEPENDENT_AMBULATORY_CARE_PROVIDER_SITE_OTHER): Payer: Commercial Managed Care - HMO | Admitting: Ophthalmology

## 2014-07-04 ENCOUNTER — Encounter (INDEPENDENT_AMBULATORY_CARE_PROVIDER_SITE_OTHER): Payer: Commercial Managed Care - HMO | Admitting: Ophthalmology

## 2014-07-07 ENCOUNTER — Encounter (INDEPENDENT_AMBULATORY_CARE_PROVIDER_SITE_OTHER): Payer: Commercial Managed Care - HMO | Admitting: Ophthalmology

## 2014-07-11 ENCOUNTER — Encounter (INDEPENDENT_AMBULATORY_CARE_PROVIDER_SITE_OTHER): Payer: Commercial Managed Care - HMO | Admitting: Ophthalmology

## 2014-08-22 ENCOUNTER — Ambulatory Visit: Payer: PPO | Admitting: Physical Therapy

## 2015-02-09 DIAGNOSIS — M150 Primary generalized (osteo)arthritis: Secondary | ICD-10-CM | POA: Diagnosis not present

## 2015-02-09 DIAGNOSIS — Z79891 Long term (current) use of opiate analgesic: Secondary | ICD-10-CM | POA: Diagnosis not present

## 2015-02-09 DIAGNOSIS — G894 Chronic pain syndrome: Secondary | ICD-10-CM | POA: Diagnosis not present

## 2015-02-09 DIAGNOSIS — M25561 Pain in right knee: Secondary | ICD-10-CM | POA: Diagnosis not present

## 2015-02-09 DIAGNOSIS — Z79899 Other long term (current) drug therapy: Secondary | ICD-10-CM | POA: Diagnosis not present

## 2015-03-07 DIAGNOSIS — M25561 Pain in right knee: Secondary | ICD-10-CM | POA: Diagnosis not present

## 2015-03-07 DIAGNOSIS — Z79899 Other long term (current) drug therapy: Secondary | ICD-10-CM | POA: Diagnosis not present

## 2015-03-07 DIAGNOSIS — G894 Chronic pain syndrome: Secondary | ICD-10-CM | POA: Diagnosis not present

## 2015-03-07 DIAGNOSIS — M150 Primary generalized (osteo)arthritis: Secondary | ICD-10-CM | POA: Diagnosis not present

## 2015-04-03 DIAGNOSIS — E1122 Type 2 diabetes mellitus with diabetic chronic kidney disease: Secondary | ICD-10-CM | POA: Diagnosis not present

## 2015-04-03 DIAGNOSIS — N08 Glomerular disorders in diseases classified elsewhere: Secondary | ICD-10-CM | POA: Diagnosis not present

## 2015-04-03 DIAGNOSIS — I130 Hypertensive heart and chronic kidney disease with heart failure and stage 1 through stage 4 chronic kidney disease, or unspecified chronic kidney disease: Secondary | ICD-10-CM | POA: Diagnosis not present

## 2015-04-03 DIAGNOSIS — R946 Abnormal results of thyroid function studies: Secondary | ICD-10-CM | POA: Diagnosis not present

## 2015-04-03 DIAGNOSIS — N182 Chronic kidney disease, stage 2 (mild): Secondary | ICD-10-CM | POA: Diagnosis not present

## 2015-04-04 DIAGNOSIS — G894 Chronic pain syndrome: Secondary | ICD-10-CM | POA: Diagnosis not present

## 2015-04-04 DIAGNOSIS — Z79891 Long term (current) use of opiate analgesic: Secondary | ICD-10-CM | POA: Diagnosis not present

## 2015-04-04 DIAGNOSIS — M150 Primary generalized (osteo)arthritis: Secondary | ICD-10-CM | POA: Diagnosis not present

## 2015-04-04 DIAGNOSIS — Z79899 Other long term (current) drug therapy: Secondary | ICD-10-CM | POA: Diagnosis not present

## 2015-04-04 DIAGNOSIS — M25561 Pain in right knee: Secondary | ICD-10-CM | POA: Diagnosis not present

## 2015-05-04 DIAGNOSIS — G8929 Other chronic pain: Secondary | ICD-10-CM | POA: Diagnosis not present

## 2015-05-04 DIAGNOSIS — S83014A Lateral dislocation of right patella, initial encounter: Secondary | ICD-10-CM | POA: Diagnosis not present

## 2015-05-04 DIAGNOSIS — M25561 Pain in right knee: Secondary | ICD-10-CM | POA: Diagnosis not present

## 2015-05-04 DIAGNOSIS — Z96651 Presence of right artificial knee joint: Secondary | ICD-10-CM | POA: Diagnosis not present

## 2015-05-08 DIAGNOSIS — Z79899 Other long term (current) drug therapy: Secondary | ICD-10-CM | POA: Diagnosis not present

## 2015-05-08 DIAGNOSIS — G894 Chronic pain syndrome: Secondary | ICD-10-CM | POA: Diagnosis not present

## 2015-05-08 DIAGNOSIS — M150 Primary generalized (osteo)arthritis: Secondary | ICD-10-CM | POA: Diagnosis not present

## 2015-05-08 DIAGNOSIS — M25561 Pain in right knee: Secondary | ICD-10-CM | POA: Diagnosis not present

## 2015-06-06 ENCOUNTER — Emergency Department (HOSPITAL_COMMUNITY)
Admission: EM | Admit: 2015-06-06 | Discharge: 2015-06-06 | Disposition: A | Discharge status: Home / Self Care | Payer: Medicare Other | Attending: Emergency Medicine | Admitting: Emergency Medicine

## 2015-06-06 ENCOUNTER — Encounter (HOSPITAL_COMMUNITY): Payer: Self-pay | Admitting: Emergency Medicine

## 2015-06-06 DIAGNOSIS — E109 Type 1 diabetes mellitus without complications: Secondary | ICD-10-CM | POA: Diagnosis not present

## 2015-06-06 DIAGNOSIS — G894 Chronic pain syndrome: Secondary | ICD-10-CM | POA: Diagnosis not present

## 2015-06-06 DIAGNOSIS — R197 Diarrhea, unspecified: Secondary | ICD-10-CM | POA: Diagnosis not present

## 2015-06-06 DIAGNOSIS — E785 Hyperlipidemia, unspecified: Secondary | ICD-10-CM | POA: Diagnosis not present

## 2015-06-06 DIAGNOSIS — F17210 Nicotine dependence, cigarettes, uncomplicated: Secondary | ICD-10-CM | POA: Diagnosis not present

## 2015-06-06 DIAGNOSIS — I10 Essential (primary) hypertension: Secondary | ICD-10-CM | POA: Insufficient documentation

## 2015-06-06 DIAGNOSIS — Z96651 Presence of right artificial knee joint: Secondary | ICD-10-CM | POA: Insufficient documentation

## 2015-06-06 DIAGNOSIS — Z7982 Long term (current) use of aspirin: Secondary | ICD-10-CM | POA: Diagnosis not present

## 2015-06-06 DIAGNOSIS — M150 Primary generalized (osteo)arthritis: Secondary | ICD-10-CM | POA: Diagnosis not present

## 2015-06-06 DIAGNOSIS — Z79899 Other long term (current) drug therapy: Secondary | ICD-10-CM | POA: Insufficient documentation

## 2015-06-06 DIAGNOSIS — Z79891 Long term (current) use of opiate analgesic: Secondary | ICD-10-CM | POA: Diagnosis not present

## 2015-06-06 DIAGNOSIS — M25561 Pain in right knee: Secondary | ICD-10-CM | POA: Diagnosis not present

## 2015-06-06 LAB — CBC
HCT: 31.5 % — ABNORMAL LOW (ref 36.0–46.0)
HEMOGLOBIN: 11.3 g/dL — AB (ref 12.0–15.0)
MCH: 28.8 pg (ref 26.0–34.0)
MCHC: 35.9 g/dL (ref 30.0–36.0)
MCV: 80.2 fL (ref 78.0–100.0)
Platelets: 327 10*3/uL (ref 150–400)
RBC: 3.93 MIL/uL (ref 3.87–5.11)
RDW: 12.8 % (ref 11.5–15.5)
WBC: 7.6 10*3/uL (ref 4.0–10.5)

## 2015-06-06 LAB — COMPREHENSIVE METABOLIC PANEL
ALK PHOS: 100 U/L (ref 38–126)
ALT: 9 U/L — ABNORMAL LOW (ref 14–54)
ANION GAP: 10 (ref 5–15)
AST: 16 U/L (ref 15–41)
Albumin: 3.7 g/dL (ref 3.5–5.0)
BILIRUBIN TOTAL: 1.4 mg/dL — AB (ref 0.3–1.2)
BUN: 20 mg/dL (ref 6–20)
CALCIUM: 9.8 mg/dL (ref 8.9–10.3)
CO2: 21 mmol/L — ABNORMAL LOW (ref 22–32)
Chloride: 108 mmol/L (ref 101–111)
Creatinine, Ser: 0.86 mg/dL (ref 0.44–1.00)
Glucose, Bld: 94 mg/dL (ref 65–99)
Potassium: 3.3 mmol/L — ABNORMAL LOW (ref 3.5–5.1)
SODIUM: 139 mmol/L (ref 135–145)
TOTAL PROTEIN: 7.8 g/dL (ref 6.5–8.1)

## 2015-06-06 LAB — LIPASE, BLOOD: Lipase: 31 U/L (ref 11–51)

## 2015-06-06 MED ORDER — POTASSIUM CHLORIDE CRYS ER 20 MEQ PO TBCR
40.0000 meq | EXTENDED_RELEASE_TABLET | Freq: Once | ORAL | Status: AC
  Administered 2015-06-06: 40 meq via ORAL
  Filled 2015-06-06: qty 2

## 2015-06-06 MED ORDER — SODIUM CHLORIDE 0.9 % IV BOLUS (SEPSIS)
500.0000 mL | Freq: Once | INTRAVENOUS | Status: AC
  Administered 2015-06-06: 500 mL via INTRAVENOUS

## 2015-06-06 NOTE — Discharge Instructions (Signed)
Read the information below.  You may return to the Emergency Department at any time for worsening condition or any new symptoms that concern you.  If you develop high fevers, worsening abdominal pain, uncontrolled vomiting, or are unable to tolerate fluids by mouth, return to the ER for a recheck.     Chronic Diarrhea Diarrhea is frequent loose and watery bowel movements. It can cause you to feel weak and dehydrated. Dehydration can cause you to become tired and thirsty and to have a dry mouth, decreased urination, and dark yellow urine. Diarrhea is a sign of another problem, most often an infection that will not last long. In most cases, diarrhea lasts 2-3 days. Diarrhea that lasts longer than 4 weeks is called long-lasting (chronic) diarrhea. It is important to treat your diarrhea as directed by your health care provider to lessen or prevent future episodes of diarrhea.  CAUSES  There are many causes of chronic diarrhea. The following are some possible causes:   Gastrointestinal infections caused by viruses, bacteria, or parasites.   Food poisoning or food allergies.   Certain medicines, such as antibiotics, chemotherapy, and laxatives.   Artificial sweeteners and fructose.   Digestive disorders, such as celiac disease and inflammatory bowel diseases.   Irritable bowel syndrome.  Some disorders of the pancreas.  Disorders of the thyroid.  Reduced blood flow to the intestines.  Cancer. Sometimes the cause of chronic diarrhea is unknown. RISK FACTORS  Having a severely weakened immune system, such as from HIV or AIDS.   Taking certain types of cancer-fighting drugs (such as with chemotherapy) or other medicines.   Having had a recent organ transplant.   Having a portion of the stomach or small bowel removed.   Traveling to countries where food and water supplies are often contaminated.  SYMPTOMS  In addition to frequent, loose stools, diarrhea may cause:    Cramping.   Abdominal pain.   Nausea.   Fever.  Fatigue.  Urgent need to use the bathroom.  Loss of bowel control. DIAGNOSIS  Your health care provider must take a careful history and perform a physical exam. Tests given are based on your symptoms and history. Tests may include:   Blood or stool tests. Three or more stool samples may be examined. Stool cultures may be used to test for bacteria or parasites.   X-rays.   A procedure in which a thin tube is inserted into the mouth or rectum (endoscopy). This allows the health care provider to look inside the intestine.  TREATMENT   Treatment is aimed at correcting the cause of the diarrhea when possible.  Diarrhea caused by an infection can often be treated with antibiotic medicines.  Diarrhea not caused by an infection may require you to take long-term medicine or have surgery. Specific treatment should be discussed with your health care provider.  If the cause cannot be determined, treatment aims to relieve symptoms and prevent dehydration. Serious health problems can occur if you do not maintain proper fluid levels. Treatment may include:  Taking an oral rehydration solution (ORS).  Not drinking beverages that contain caffeine (such as tea, coffee, and soft drinks).  Not drinking alcohol.  Maintaining well-balanced nutrition to help you recover faster. HOME CARE INSTRUCTIONS   Drink enough fluids to keep urine clear or pale yellow. Drink 1 cup (8 oz) of fluid for each diarrhea episode. Avoid fluids that contain simple sugars, fruit juices, whole milk products, and sodas. Hydrate with an ORS. You may purchase  the ORS or prepare it at home by mixing the following ingredients together:   - tsp (1.7-3  mL) table salt.   tsp (3  mL) baking soda.   tsp (1.7 mL) salt substitute containing potassium chloride.  1 tbsp (20 mL) sugar.  4.2 c (1 L) of water.   Certain foods and beverages may increase the speed at  which food moves through the gastrointestinal (GI) tract. These foods and beverages should be avoided. They include:  Caffeinated and alcoholic beverages.  High-fiber foods, such as raw fruits and vegetables, nuts, seeds, and whole grain breads and cereals.  Foods and beverages sweetened with sugar alcohols, such as xylitol, sorbitol, and mannitol.   Some foods may be well tolerated and may help thicken stool. These include:  Starchy foods, such as rice, toast, pasta, low-sugar cereal, oatmeal, grits, baked potatoes, crackers, and bagels.  Bananas.  Applesauce.  Add probiotic-rich foods to help increase healthy bacteria in the GI tract. These include yogurt and fermented milk products.  Wash your hands well after each diarrhea episode.  Only take over-the-counter or prescription medicines as directed by your health care provider.  Take a warm bath to relieve any burning or pain from frequent diarrhea episodes. SEEK MEDICAL CARE IF:   You are not urinating as often.  Your urine is a dark color.  You become very tired or dizzy.  You have severe pain in the abdomen or rectum.  Your have blood or pus in your stools.  Your stools look black and tarry. SEEK IMMEDIATE MEDICAL CARE IF:   You are unable to keep fluids down.  You have persistent vomiting.  You have blood in your stool.  Your stools are black and tarry.  You do not urinate in 6-8 hours, or there is only a small amount of very dark urine.  You have abdominal pain that increases or localizes.  You have weakness, dizziness, confusion, or lightheadedness.  You have a severe headache.  Your diarrhea gets worse or does not get better.  You have a fever or persistent symptoms for more than 2-3 days.  You have a fever and your symptoms suddenly get worse. MAKE SURE YOU:   Understand these instructions.  Will watch your condition.  Will get help right away if you are not doing well or get worse.   This  information is not intended to replace advice given to you by your health care provider. Make sure you discuss any questions you have with your health care provider.   Document Released: 03/23/2003 Document Revised: 01/05/2013 Document Reviewed: 06/25/2012 Elsevier Interactive Patient Education 2016 [employer]  Diarrhea Diarrhea is frequent loose and watery bowel movements. It can cause you to feel weak and dehydrated. Dehydration can cause you to become tired and thirsty, have a dry mouth, and have decreased urination that often is dark yellow. Diarrhea is a sign of another problem, most often an infection that will not last long. In most cases, diarrhea typically lasts 2-3 days. However, it can last longer if it is a sign of something more serious. It is important to treat your diarrhea as directed by your caregiver to lessen or prevent future episodes of diarrhea. CAUSES  Some common causes include:  Gastrointestinal infections caused by viruses, bacteria, or parasites.  Food poisoning or food allergies.  Certain medicines, such as antibiotics, chemotherapy, and laxatives.  Artificial sweeteners and fructose.  Digestive disorders. HOME CARE INSTRUCTIONS  Ensure adequate fluid intake (hydration): Have 1 cup (  8 oz) of fluid for each diarrhea episode. Avoid fluids that contain simple sugars or sports drinks, fruit juices, whole milk products, and sodas. Your urine should be clear or pale yellow if you are drinking enough fluids. Hydrate with an oral rehydration solution that you can purchase at pharmacies, retail stores, and online. You can prepare an oral rehydration solution at home by mixing the following ingredients together:   - tsp table salt.   tsp baking soda.   tsp salt substitute containing potassium chloride.  1  tablespoons sugar.  1 L (34 oz) of water.  Certain foods and beverages may increase the speed at which food moves through the gastrointestinal (GI) tract.  These foods and beverages should be avoided and include:  Caffeinated and alcoholic beverages.  High-fiber foods, such as raw fruits and vegetables, nuts, seeds, and whole grain breads and cereals.  Foods and beverages sweetened with sugar alcohols, such as xylitol, sorbitol, and mannitol.  Some foods may be well tolerated and may help thicken stool including:  Starchy foods, such as rice, toast, pasta, low-sugar cereal, oatmeal, grits, baked potatoes, crackers, and bagels.  Bananas.  Applesauce.  Add probiotic-rich foods to help increase healthy bacteria in the GI tract, such as yogurt and fermented milk products.  Wash your hands well after each diarrhea episode.  Only take over-the-counter or prescription medicines as directed by your caregiver.  Take a warm bath to relieve any burning or pain from frequent diarrhea episodes. SEEK IMMEDIATE MEDICAL CARE IF:   You are unable to keep fluids down.  You have persistent vomiting.  You have blood in your stool, or your stools are black and tarry.  You do not urinate in 6-8 hours, or there is only a small amount of very dark urine.  You have abdominal pain that increases or localizes.  You have weakness, dizziness, confusion, or light-headedness.  You have a severe headache.  Your diarrhea gets worse or does not get better.  You have a fever or persistent symptoms for more than 2-3 days.  You have a fever and your symptoms suddenly get worse. MAKE SURE YOU:   Understand these instructions.  Will watch your condition.  Will get help right away if you are not doing well or get worse.   This information is not intended to replace advice given to you by your health care provider. Make sure you discuss any questions you have with your health care provider.   Document Released: 12/21/2001 Document Revised: 01/21/2014 Document Reviewed: 09/08/2011 Elsevier Interactive Patient Education [employer]

## 2015-06-06 NOTE — ED Notes (Signed)
PT request bedpan to give sample. Unable to give sample at this time

## 2015-06-06 NOTE — ED Notes (Signed)
Per pt, states diarrhea since Saturday-[facility]-no relief with OTC meds-states she has not been on antibiotics

## 2015-06-06 NOTE — ED Provider Notes (Signed)
CSN: [id number]     Arrival date & time 06/06/15  4098 History   First MD Initiated Contact with Patient 06/06/15 1105     Chief Complaint  Patient presents with  . Diarrhea     (Consider location/radiation/quality/duration/timing/severity/associated sxs/prior Treatment) The history is provided by the patient.     Patient presents with intermittent nonbloody diarrhea for the past 3 weeks.  The diarrhea improves with kaopectate and imodium but returns when she is not taking it.  Reports generalized weakness.  Denies fevers, chills, myalgias, abdominal pain, nausea, vomiting, urinary symptoms.   Has never had a colonoscopy, no GI doctor.  Denies suspicious foods, sick contacts.  No recent antibiotics.  She did eat at [facility] as she reported in triage, but this was after the initial symptoms began.    PCP Dorothyann Peng   Past Medical History  Diagnosis Date  . Hyperlipemia   . Arthritis     osteoarthritis  . Diabetes mellitus     Insulin dependent  . Hypertension   . H/O cardiovascular stress test     pt. reports 10/13- was normal per pt.    Past Surgical History  Procedure Laterality Date  . Carpal tunnel release Bilateral   . Knee arthroscopy      right  . 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 Total knee arthroplasty  10/21/2011    Procedure: TOTAL KNEE ARTHROPLASTY;  Surgeon: Raymon Mutton, MD;  Location: [facility] OR;  Service: Orthopedics;  Laterality: Right;  . Quadriceps tendon repair  02/17/2012    Procedure: REPAIR QUADRICEP TENDON;  Surgeon: Dannielle Huh, MD;  Location: [facility] OR;  Service: Orthopedics;  Laterality: Right;  right quadriceps repair with latera release  . Tubal ligation    . Quadriceps tendon repair Right 05/15/2012    Procedure: REPAIR QUADRICEP TENDON;  Surgeon: Dannielle Huh, MD;  Location: [facility] OR;  Service: Orthopedics;  Laterality: Right;   No family history on file. Social History  Substance Use Topics  . Smoking status: Current Every Day Smoker --  0.25 packs/day for 20 years    Types: Cigarettes  . Smokeless tobacco: Never Used  . Alcohol Use: No   OB History    No data available     Review of Systems  Musculoskeletal: Positive for arthralgias ( chronic arthritis pain, unchanged from baseline ).  All other systems reviewed and are negative.     Allergies  Review of patient's allergies indicates no known allergies.  Home Medications   Prior to Admission medications   Medication Sig Start Date End Date Taking? Authorizing Provider  amLODipine (NORVASC) 5 MG tablet Take 5 mg by mouth daily.    Historical Provider, MD  aspirin 325 MG EC tablet Take 325 mg by mouth daily.    Historical Provider, MD  Cyanocobalamin (VITAMIN B-12) 1000 MCG SUBL Place 1,000 mcg under the tongue daily.    Historical Provider, MD  exenatide (BYETTA) 5 MCG/0.02ML SOLN Inject 5 mcg into the skin 2 (two) times daily with a meal.    Historical Provider, MD  fluticasone (FLONASE) 50 MCG/ACT nasal spray Place 2 sprays into the nose daily. 08/05/12   Tatyana Kirichenko, PA-C  insulin glargine (LANTUS) 100 UNIT/ML injection Inject 38 Units into the skin at bedtime.     Historical Provider, MD  losartan-hydrochlorothiazide (HYZAAR) 100-25 MG per tablet Take 1 tablet by mouth daily.    Historical Provider, MD  meclizine (ANTIVERT) 50 MG tablet Take 1  tablet (50 mg total) by mouth 3 (three) times daily as needed. 08/05/12   Tatyana Kirichenko, PA-C  OxyCODONE (OXYCONTIN) 10 mg T12A Take 1 tablet (10 mg total) by mouth every 12 (twelve) hours. 05/16/12   Altamese CabalMaurice Jones, PA-C  rosuvastatin (CRESTOR) 10 MG tablet Take 10 mg by mouth daily.    Historical Provider, MD  traMADol (ULTRAM) 50 MG tablet Take 50 mg by mouth every 8 (eight) hours as needed for pain.    Historical Provider, MD   BP 135/89 mmHg  Pulse 102  Temp(Src) 98.3 F (36.8 C) (Oral)  Resp 20  SpO2 99% Physical Exam  Constitutional: She appears well-developed and well-nourished. No distress.  HENT:   Head: Normocephalic and atraumatic.  Neck: Neck supple.  Cardiovascular: Normal rate and regular rhythm.   Pulmonary/Chest: Effort normal and breath sounds normal. No respiratory distress. She has no wheezes. She has no rales.  Abdominal: Soft. She exhibits no distension. There is no tenderness. There is no rebound and no guarding.  Neurological: She is alert. She exhibits normal muscle tone.  Skin: She is not diaphoretic.  Psychiatric: She has a normal mood and affect. Her behavior is normal.  Nursing note and vitals reviewed.   ED Course  Procedures (including critical care time) Labs Review Labs Reviewed  COMPREHENSIVE METABOLIC PANEL - Abnormal; Notable for the following:    Potassium 3.3 (*)    CO2 21 (*)    ALT 9 (*)    Total Bilirubin 1.4 (*)    All other components within normal limits  CBC - Abnormal; Notable for the following:    Hemoglobin 11.3 (*)    HCT 31.5 (*)    All other components within normal limits  GASTROINTESTINAL PANEL BY PCR, STOOL (REPLACES STOOL CULTURE)  STOOL CULTURE  LIPASE, BLOOD  URINALYSIS, ROUTINE W REFLEX MICROSCOPIC (NOT AT [facility])    Imaging Review No results found. I have personally reviewed and evaluated these images and lab results as part of my medical decision-making.   EKG Interpretation None      MDM   Final diagnoses:  Diarrhea, unspecified type    Afebrile, nontoxic patient with three weeks of diarrhea without fever or abdominal pain.  The diarrhea is nonbloody.  Labs remarkable for mild hypokalemia only.  Stool panel/culture ordered  Discussed pt with Dr Patria Maneampos who also saw the patient.  D/C home with PCP, GI follow up.  Discussed result, findings, treatment, and follow up  with patient.  Pt given return precautions.  Pt verbalizes understanding and agrees with plan.        Trixie Dredgemily Ebelin Dillehay, PA-C 06/06/15 1546  Azalia BilisKevin Campos, MD 06/06/15 [phone]

## 2015-07-04 DIAGNOSIS — Z79899 Other long term (current) drug therapy: Secondary | ICD-10-CM | POA: Diagnosis not present

## 2015-07-04 DIAGNOSIS — M150 Primary generalized (osteo)arthritis: Secondary | ICD-10-CM | POA: Diagnosis not present

## 2015-07-04 DIAGNOSIS — M25561 Pain in right knee: Secondary | ICD-10-CM | POA: Diagnosis not present

## 2015-07-04 DIAGNOSIS — G894 Chronic pain syndrome: Secondary | ICD-10-CM | POA: Diagnosis not present

## 2015-07-08 ENCOUNTER — Encounter (HOSPITAL_COMMUNITY): Payer: Self-pay

## 2015-07-08 ENCOUNTER — Emergency Department (HOSPITAL_COMMUNITY): Payer: Medicare Other

## 2015-07-08 ENCOUNTER — Emergency Department (HOSPITAL_COMMUNITY)
Admission: EM | Admit: 2015-07-08 | Discharge: 2015-07-08 | Disposition: A | Discharge status: Home / Self Care | Payer: Medicare Other | Attending: Emergency Medicine | Admitting: Emergency Medicine

## 2015-07-08 DIAGNOSIS — M7989 Other specified soft tissue disorders: Secondary | ICD-10-CM | POA: Diagnosis present

## 2015-07-08 DIAGNOSIS — M1990 Unspecified osteoarthritis, unspecified site: Secondary | ICD-10-CM | POA: Diagnosis not present

## 2015-07-08 DIAGNOSIS — R609 Edema, unspecified: Secondary | ICD-10-CM | POA: Diagnosis not present

## 2015-07-08 DIAGNOSIS — R600 Localized edema: Secondary | ICD-10-CM | POA: Diagnosis not present

## 2015-07-08 DIAGNOSIS — R404 Transient alteration of awareness: Secondary | ICD-10-CM | POA: Diagnosis not present

## 2015-07-08 DIAGNOSIS — Z794 Long term (current) use of insulin: Secondary | ICD-10-CM | POA: Diagnosis not present

## 2015-07-08 DIAGNOSIS — Z9114 Patient's other noncompliance with medication regimen: Secondary | ICD-10-CM | POA: Diagnosis not present

## 2015-07-08 DIAGNOSIS — E785 Hyperlipidemia, unspecified: Secondary | ICD-10-CM | POA: Diagnosis not present

## 2015-07-08 DIAGNOSIS — Z7982 Long term (current) use of aspirin: Secondary | ICD-10-CM | POA: Insufficient documentation

## 2015-07-08 DIAGNOSIS — Z87891 Personal history of nicotine dependence: Secondary | ICD-10-CM | POA: Insufficient documentation

## 2015-07-08 DIAGNOSIS — E119 Type 2 diabetes mellitus without complications: Secondary | ICD-10-CM | POA: Insufficient documentation

## 2015-07-08 DIAGNOSIS — Z79899 Other long term (current) drug therapy: Secondary | ICD-10-CM | POA: Insufficient documentation

## 2015-07-08 DIAGNOSIS — I10 Essential (primary) hypertension: Secondary | ICD-10-CM | POA: Insufficient documentation

## 2015-07-08 DIAGNOSIS — Z9119 Patient's noncompliance with other medical treatment and regimen: Secondary | ICD-10-CM | POA: Diagnosis not present

## 2015-07-08 DIAGNOSIS — R0602 Shortness of breath: Secondary | ICD-10-CM | POA: Diagnosis not present

## 2015-07-08 DIAGNOSIS — R531 Weakness: Secondary | ICD-10-CM | POA: Diagnosis not present

## 2015-07-08 LAB — CBC WITH DIFFERENTIAL/PLATELET
Basophils Absolute: 0 10*3/uL (ref 0.0–0.1)
Basophils Relative: 0 %
EOS PCT: 1 %
Eosinophils Absolute: 0.1 10*3/uL (ref 0.0–0.7)
HEMATOCRIT: 26.8 % — AB (ref 36.0–46.0)
Hemoglobin: 9.7 g/dL — ABNORMAL LOW (ref 12.0–15.0)
LYMPHS ABS: 1.6 10*3/uL (ref 0.7–4.0)
LYMPHS PCT: 26 %
MCH: 29.6 pg (ref 26.0–34.0)
MCHC: 36.2 g/dL — ABNORMAL HIGH (ref 30.0–36.0)
MCV: 81.7 fL (ref 78.0–100.0)
MONO ABS: 0.5 10*3/uL (ref 0.1–1.0)
Monocytes Relative: 8 %
NEUTROS ABS: 4 10*3/uL (ref 1.7–7.7)
Neutrophils Relative %: 65 %
PLATELETS: 253 10*3/uL (ref 150–400)
RBC: 3.28 MIL/uL — AB (ref 3.87–5.11)
RDW: 14.9 % (ref 11.5–15.5)
WBC: 6.2 10*3/uL (ref 4.0–10.5)

## 2015-07-08 LAB — BASIC METABOLIC PANEL
ANION GAP: 5 (ref 5–15)
BUN: 15 mg/dL (ref 6–20)
CHLORIDE: 112 mmol/L — AB (ref 101–111)
CO2: 26 mmol/L (ref 22–32)
Calcium: 9.3 mg/dL (ref 8.9–10.3)
Creatinine, Ser: 0.77 mg/dL (ref 0.44–1.00)
GFR calc Af Amer: >60 mL/min (ref >60)
GFR calc non Af Amer: >60 mL/min (ref >60)
GLUCOSE: 74 mg/dL (ref 65–99)
POTASSIUM: 3.2 mmol/L — AB (ref 3.5–5.1)
Sodium: 143 mmol/L (ref 135–145)

## 2015-07-08 LAB — URINE MICROSCOPIC-ADD ON

## 2015-07-08 LAB — URINALYSIS, ROUTINE W REFLEX MICROSCOPIC
Bilirubin Urine: NEGATIVE
GLUCOSE, UA: NEGATIVE mg/dL
Ketones, ur: NEGATIVE mg/dL
Nitrite: NEGATIVE
PH: 5.5 (ref 5.0–8.0)
PROTEIN: NEGATIVE mg/dL
Specific Gravity, Urine: 1.016 (ref 1.005–1.030)

## 2015-07-08 LAB — CBG MONITORING, ED: Glucose-Capillary: 69 mg/dL (ref 65–99)

## 2015-07-08 MED ORDER — FUROSEMIDE 40 MG PO TABS
40.0000 mg | ORAL_TABLET | Freq: Once | ORAL | Status: AC
  Administered 2015-07-08: 40 mg via ORAL
  Filled 2015-07-08: qty 1

## 2015-07-08 MED ORDER — OXYCODONE-ACETAMINOPHEN 5-325 MG PO TABS
1.0000 | ORAL_TABLET | Freq: Once | ORAL | Status: AC
  Administered 2015-07-08: 1 via ORAL
  Filled 2015-07-08: qty 1

## 2015-07-08 NOTE — ED Notes (Signed)
Bed: WA20 Expected date:  Expected time:  Means of arrival:  Comments: EMS- 70 yo pedal edema, stopped taking lasix

## 2015-07-08 NOTE — Discharge Instructions (Signed)
TAKE LASIX AS DIRECTED Edema Edema is an abnormal buildup of fluids. It is more common in your legs and thighs. Painless swelling of the feet and ankles is more likely as a person ages. It also is common in looser skin, like around your eyes. HOME CARE   Keep the affected body part above the level of the heart while lying down.  Do not sit still or stand for a long time.  Do not put anything right under your knees when you lie down.  Do not wear tight clothes on your upper legs.  Exercise your legs to help the puffiness (swelling) go down.  Wear elastic bandages or support stockings as told by your doctor.  A low-salt diet may help lessen the puffiness.  Only take medicine as told by your doctor. GET HELP IF:  Treatment is not working.  You have heart, liver, or kidney disease and notice that your skin looks puffy or shiny.  You have puffiness in your legs that does not get better when you raise your legs.  You have sudden weight gain for no reason. GET HELP RIGHT AWAY IF:   You have shortness of breath or chest pain.  You cannot breathe when you lie down.  You have pain, redness, or warmth in the areas that are puffy.  You have heart, liver, or kidney disease and get edema all of a sudden.  You have a fever and your symptoms get worse all of a sudden. MAKE SURE YOU:   Understand these instructions.  Will watch your condition.  Will get help right away if you are not doing well or get worse.   This information is not intended to replace advice given to you by your health care provider. Make sure you discuss any questions you have with your health care provider.   Document Released: 06/19/2007 Document Revised: 01/05/2013 Document Reviewed: 10/23/2012 Elsevier Interactive Patient Education [employer]2016 [employer]vier Inc.

## 2015-07-08 NOTE — ED Notes (Signed)
PT RECEIVED FROM HOME VIA EMS C/O BILATERAL LOWER LEG SWELLING AND PAIN X1 WEEK. PT IS ON A DIURETIC REGIMEN, BUT DOES NOT TAKE THEM AS PRESCRIBED DUE TO THE PAIN OF WALKING. LAST LASIX 40MG  PILL TAKEN ON Wednesday. DENIES SOB.

## 2015-07-08 NOTE — ED Notes (Signed)
PT DISCHARGED. INSTRUCTIONS AND PRESCRIPTION GIVEN. AAOX4. PT IN NO APPARENT DISTRESS. THE OPPORTUNITY TO ASK QUESTIONS WAS PROVIDED. 

## 2015-07-08 NOTE — ED Provider Notes (Signed)
CSN: [id number]     Arrival date & time 07/08/15  1155 History   First MD Initiated Contact with Patient 07/08/15 1205     Chief Complaint  Patient presents with  . Leg Swelling    BILATERAL X1 WEEK   PT PRESENTS WITH BILATERAL LEG SWELLING.  PT HAS NOT BEEN ON LASIX SINCE Wednesday THE 21ST.  THE PT SAID SHE WAS OUT OF HER PAIN MEDS FOR HER ARTHRITIS, SO SHE DID NOT WANT TO WALK WITHOUT HER PAIN MEDS.  SHE DID NOT WANT TO HAVE TO GET UP TO GO TO THE BATHROOM BECAUSE OF THE PAIN WITH WALKING.  SHE HAS THE PAIN MEDS NOW.  SHE DOES NOT SEEM TOO WORRIED ABOUT HER LEGS, BUT HER FAMILY WAS WORRIED HER LUNGS WOULD DEVELOP FLUID.  (Consider location/radiation/quality/duration/timing/severity/associated sxs/prior Treatment) The history is provided by the patient.    Past Medical History  Diagnosis Date  . Hyperlipemia   . Arthritis     osteoarthritis  . Diabetes mellitus     Insulin dependent  . Hypertension   . H/O cardiovascular stress test     pt. reports 10/13- was normal per pt.    Past Surgical History  Procedure Laterality Date  . Carpal tunnel release Bilateral   . Knee arthroscopy      right  . 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 Total knee arthroplasty  10/21/2011    Procedure: TOTAL KNEE ARTHROPLASTY;  Surgeon: Raymon MuttonStephen D Lucey, MD;  Location: [facility] OR;  Service: Orthopedics;  Laterality: Right;  . Quadriceps tendon repair  02/17/2012    Procedure: REPAIR QUADRICEP TENDON;  Surgeon: Dannielle HuhSteve Lucey, MD;  Location: [facility] OR;  Service: Orthopedics;  Laterality: Right;  right quadriceps repair with latera release  . Tubal ligation    . Quadriceps tendon repair Right 05/15/2012    Procedure: REPAIR QUADRICEP TENDON;  Surgeon: Dannielle HuhSteve Lucey, MD;  [facility];  Service: Orthopedics;  Laterality: Right;   History reviewed. No pertinent family history. Social History  Substance Use Topics  . Smoking status: Former Smoker -- 0.25 packs/day for 20 years    Types: Cigarettes   Quit date: 06/28/2014  . Smokeless tobacco: Never Used  . Alcohol Use: No   OB History    No data available     Review of Systems  Musculoskeletal:       LEG SWELLING  All other systems reviewed and are negative.     Allergies  Review of patient's allergies indicates no known allergies.  Home Medications   Prior to Admission medications   Medication Sig Start Date End Date Taking? Authorizing Provider  aspirin EC 81 MG tablet Take 81 mg by mouth daily.   Yes Historical Provider, MD  fluticasone (FLONASE) 50 MCG/ACT nasal spray Place 2 sprays into the nose daily. Patient taking differently: Place 2 sprays into the nose daily as needed for allergies.  08/05/12  Yes Tatyana Kirichenko, PA-C  insulin glargine (LANTUS) 100 UNIT/ML injection Inject 26 Units into the skin at bedtime.    Yes Historical Provider, MD  oxyCODONE-acetaminophen (PERCOCET) 7.5-325 MG tablet Take 1 tablet by mouth every 4 (four) hours as needed. pain 04/04/15  Yes Historical Provider, MD  rosuvastatin (CRESTOR) 10 MG tablet Take 10 mg by mouth daily.   Yes Historical Provider, MD  valsartan-hydrochlorothiazide (DIOVAN-HCT) 160-25 MG tablet Take 1 tablet by mouth daily. 05/15/15  Yes Historical Provider, MD   BP 158/71 mmHg  Pulse 89  Temp(Src) 98.3 F (36.8 C) (Oral)  Resp 18  Ht 5\' 7"  (1.702 m)  Wt 200 lb (90.719 kg)  BMI 31.32 kg/m2  SpO2 99% Physical Exam  Constitutional: She is oriented to person, place, and time. She appears well-developed and well-nourished.  HENT:  Head: Normocephalic and atraumatic.  Right Ear: External ear normal.  Left Ear: External ear normal.  Nose: Nose normal.  Mouth/Throat: Oropharynx is clear and moist.  Eyes: Conjunctivae and EOM are normal. Pupils are equal, round, and reactive to light.  Neck: Normal range of motion. Neck supple.  Cardiovascular: Normal rate, regular rhythm, normal heart sounds and intact distal pulses.   Pulmonary/Chest: Effort normal and breath  sounds normal.  Abdominal: Soft. Bowel sounds are normal.  Musculoskeletal: She exhibits edema.  Neurological: She is alert and oriented to person, place, and time.  Skin: Skin is warm and dry.  Psychiatric: She has a normal mood and affect. Her behavior is normal. Judgment and thought content normal.  Nursing note and vitals reviewed.   ED Course  Procedures (including critical care time) Labs Review Labs Reviewed  BASIC METABOLIC PANEL - Abnormal; Notable for the following:    Potassium 3.2 (*)    Chloride 112 (*)    All other components within normal limits  CBC WITH DIFFERENTIAL/PLATELET - Abnormal; Notable for the following:    RBC 3.28 (*)    Hemoglobin 9.7 (*)    HCT 26.8 (*)    MCHC 36.2 (*)    All other components within normal limits  URINALYSIS, ROUTINE W REFLEX MICROSCOPIC (NOT AT [facility]) - Abnormal; Notable for the following:    APPearance CLOUDY (*)    Hgb urine dipstick TRACE (*)    Leukocytes, UA SMALL (*)    All other components within normal limits  URINE MICROSCOPIC-ADD ON - Abnormal; Notable for the following:    Squamous Epithelial / LPF 6-30 (*)    Bacteria, UA FEW (*)    All other components within normal limits  CBG MONITORING, ED    Imaging Review Dg Chest 2 View  07/08/2015  CLINICAL DATA:  Shortness breath EXAM: CHEST  2 VIEW COMPARISON:  10/11/2011 chest radiograph. FINDINGS: Mild enlargement of the cardiac silhouette, increased. Otherwise normal mediastinal contour. No pneumothorax. No pleural effusion. New mild pulmonary edema. No acute consolidative airspace disease. IMPRESSION: Mild enlargement of the cardiac silhouette, increased. Mild pulmonary edema. Findings suggest mild congestive heart failure. Electronically Signed   By: Delbert PhenixJason A Poff M.D.   On: 07/08/2015 14:30   I have personally reviewed and evaluated these images and lab results as part of my medical decision-making.   EKG Interpretation None      MDM  Pt's family requests home  health.  I put an order in for pt.  I also ordered an extra large bedside commode as pt neglects her lasix because it hurts to walk too far.  Pt knows to take her meds as directed.  Pt also knows to return if worse. Final diagnoses:  Peripheral edema  Noncompliance with medication regimen       Jacalyn LefevreJulie Malory Spurr, MD 07/08/15 1441

## 2015-07-10 DIAGNOSIS — I5032 Chronic diastolic (congestive) heart failure: Secondary | ICD-10-CM | POA: Diagnosis not present

## 2015-07-10 DIAGNOSIS — I130 Hypertensive heart and chronic kidney disease with heart failure and stage 1 through stage 4 chronic kidney disease, or unspecified chronic kidney disease: Secondary | ICD-10-CM | POA: Diagnosis not present

## 2015-07-10 DIAGNOSIS — R600 Localized edema: Secondary | ICD-10-CM | POA: Diagnosis not present

## 2015-07-10 DIAGNOSIS — N182 Chronic kidney disease, stage 2 (mild): Secondary | ICD-10-CM | POA: Diagnosis not present

## 2015-07-16 ENCOUNTER — Observation Stay (HOSPITAL_COMMUNITY)
Admission: EM | Admit: 2015-07-16 | Discharge: 2015-07-17 | Disposition: A | Discharge status: Skilled Nursing Facility | Payer: Medicare Other | Attending: Internal Medicine | Admitting: Internal Medicine

## 2015-07-16 ENCOUNTER — Emergency Department (HOSPITAL_COMMUNITY): Payer: Medicare Other

## 2015-07-16 ENCOUNTER — Encounter (HOSPITAL_COMMUNITY): Payer: Self-pay | Admitting: *Deleted

## 2015-07-16 DIAGNOSIS — Z9114 Patient's other noncompliance with medication regimen: Secondary | ICD-10-CM | POA: Insufficient documentation

## 2015-07-16 DIAGNOSIS — Z79899 Other long term (current) drug therapy: Secondary | ICD-10-CM | POA: Insufficient documentation

## 2015-07-16 DIAGNOSIS — Z87891 Personal history of nicotine dependence: Secondary | ICD-10-CM | POA: Insufficient documentation

## 2015-07-16 DIAGNOSIS — E119 Type 2 diabetes mellitus without complications: Secondary | ICD-10-CM | POA: Insufficient documentation

## 2015-07-16 DIAGNOSIS — S83004S Unspecified dislocation of right patella, sequela: Secondary | ICD-10-CM | POA: Diagnosis not present

## 2015-07-16 DIAGNOSIS — I10 Essential (primary) hypertension: Secondary | ICD-10-CM | POA: Insufficient documentation

## 2015-07-16 DIAGNOSIS — S86811A Strain of other muscle(s) and tendon(s) at lower leg level, right leg, initial encounter: Secondary | ICD-10-CM | POA: Diagnosis not present

## 2015-07-16 DIAGNOSIS — R030 Elevated blood-pressure reading, without diagnosis of hypertension: Secondary | ICD-10-CM | POA: Diagnosis not present

## 2015-07-16 DIAGNOSIS — M25561 Pain in right knee: Secondary | ICD-10-CM | POA: Diagnosis not present

## 2015-07-16 DIAGNOSIS — M7989 Other specified soft tissue disorders: Secondary | ICD-10-CM | POA: Diagnosis not present

## 2015-07-16 DIAGNOSIS — R600 Localized edema: Secondary | ICD-10-CM | POA: Diagnosis not present

## 2015-07-16 DIAGNOSIS — Z794 Long term (current) use of insulin: Secondary | ICD-10-CM | POA: Diagnosis not present

## 2015-07-16 DIAGNOSIS — Z6841 Body Mass Index (BMI) 40.0 and over, adult: Secondary | ICD-10-CM | POA: Insufficient documentation

## 2015-07-16 DIAGNOSIS — M25569 Pain in unspecified knee: Secondary | ICD-10-CM | POA: Diagnosis not present

## 2015-07-16 DIAGNOSIS — M2201 Recurrent dislocation of patella, right knee: Principal | ICD-10-CM | POA: Insufficient documentation

## 2015-07-16 DIAGNOSIS — Z7982 Long term (current) use of aspirin: Secondary | ICD-10-CM | POA: Diagnosis not present

## 2015-07-16 DIAGNOSIS — R609 Edema, unspecified: Secondary | ICD-10-CM

## 2015-07-16 LAB — GLUCOSE, CAPILLARY: GLUCOSE-CAPILLARY: 152 mg/dL — AB (ref 65–99)

## 2015-07-16 LAB — CBG MONITORING, ED: Glucose-Capillary: 94 mg/dL (ref 65–99)

## 2015-07-16 MED ORDER — IRBESARTAN 150 MG PO TABS
150.0000 mg | ORAL_TABLET | Freq: Every day | ORAL | Status: DC
  Administered 2015-07-17: 150 mg via ORAL
  Filled 2015-07-16: qty 1

## 2015-07-16 MED ORDER — HYDROCHLOROTHIAZIDE 25 MG PO TABS
25.0000 mg | ORAL_TABLET | Freq: Every day | ORAL | Status: DC
  Administered 2015-07-17: 25 mg via ORAL
  Filled 2015-07-16: qty 1

## 2015-07-16 MED ORDER — OXYCODONE-ACETAMINOPHEN 7.5-325 MG PO TABS
1.0000 | ORAL_TABLET | ORAL | Status: DC | PRN
  Administered 2015-07-16 – 2015-07-17 (×5): 1 via ORAL
  Filled 2015-07-16 (×5): qty 1

## 2015-07-16 MED ORDER — VALSARTAN-HYDROCHLOROTHIAZIDE 160-25 MG PO TABS: 1.0000 | ORAL_TABLET | Freq: Every day | ORAL | Status: DC

## 2015-07-16 MED ORDER — INSULIN ASPART 100 UNIT/ML ~~LOC~~ SOLN: 0.0000 [IU] | Freq: Three times a day (TID) | SUBCUTANEOUS | Status: DC

## 2015-07-16 MED ORDER — FLUTICASONE PROPIONATE 50 MCG/ACT NA SUSP
2.0000 | Freq: Every day | NASAL | Status: DC
  Filled 2015-07-16: qty 16

## 2015-07-16 MED ORDER — OXYCODONE-ACETAMINOPHEN 7.5-325 MG PO TABS
1.0000 | ORAL_TABLET | Freq: Once | ORAL | Status: AC
Start: 2015-07-16 — End: 2015-07-16
  Administered 2015-07-16: 1 via ORAL
  Filled 2015-07-16: qty 1

## 2015-07-16 MED ORDER — ENOXAPARIN SODIUM 40 MG/0.4ML ~~LOC~~ SOLN
40.0000 mg | SUBCUTANEOUS | Status: DC
  Administered 2015-07-16: 40 mg via SUBCUTANEOUS
  Filled 2015-07-16: qty 0.4

## 2015-07-16 MED ORDER — INSULIN GLARGINE 100 UNIT/ML ~~LOC~~ SOLN
26.0000 [IU] | Freq: Every day | SUBCUTANEOUS | Status: DC
  Administered 2015-07-16: 26 [IU] via SUBCUTANEOUS
  Filled 2015-07-16 (×2): qty 0.26

## 2015-07-16 MED ORDER — ROSUVASTATIN CALCIUM 10 MG PO TABS
10.0000 mg | ORAL_TABLET | Freq: Every day | ORAL | Status: DC
  Administered 2015-07-17: 10 mg via ORAL
  Filled 2015-07-16: qty 1
  Filled 2015-07-16: qty 2

## 2015-07-16 MED ORDER — ASPIRIN EC 81 MG PO TBEC
81.0000 mg | DELAYED_RELEASE_TABLET | Freq: Every day | ORAL | Status: DC
  Administered 2015-07-17: 81 mg via ORAL
  Filled 2015-07-16: qty 1

## 2015-07-16 NOTE — ED Notes (Addendum)
Pt from home via EMS c/o R knee pain that started last pm with pt being unable to bear weight. Pt had TKR 4 years ago. Pt is A&O and in NAD.

## 2015-07-16 NOTE — ED Notes (Signed)
Case mgr called to speak to pt.  Pt resting comfortably.

## 2015-07-16 NOTE — ED Provider Notes (Signed)
CSN: [id number]     Arrival date & time 07/16/15  1030 History   First MD Initiated Contact with Patient 07/16/15 1035     Chief Complaint  Patient presents with  . Knee Pain     (Consider location/radiation/quality/duration/timing/severity/associated sxs/prior Treatment) Patient is a 70 y.o. female presenting with knee pain. The history is provided by the patient.  Knee Pain Location:  Knee Time since incident:  2 months Injury: no   Knee location:  R knee Pain details:    Quality:  Aching   Radiates to:  Does not radiate   Severity:  Moderate   Onset quality:  Sudden   Duration:  2 days   Timing:  Constant   Progression:  Worsening Chronicity:  New Prior injury to area:  No Relieved by:  Nothing Worsened by:  Bearing weight Ineffective treatments:  None tried Associated symptoms: no fever    70 yo F With a chief complaint of right knee pain. This is been a chronic issue for this patient. She had a knee surgery in which her patella was permanently dislocated. She walks with a walker normally. The past couple days feels that her pain is gotten worse in the knee feels unstable. She denies any injury. She's been having trouble getting up out of bed and moving. Her family is concerned that she needs to be a nursing home and is thought this for quite some time.  Past Medical History  Diagnosis Date  . Hyperlipemia   . Arthritis     osteoarthritis  . Diabetes mellitus     Insulin dependent  . Hypertension   . H/O cardiovascular stress test     pt. reports 10/13- was normal per pt.    Past Surgical History  Procedure Laterality Date  . Carpal tunnel release Bilateral   . Knee arthroscopy      right  . 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 Total knee arthroplasty  10/21/2011    Procedure: TOTAL KNEE ARTHROPLASTY;  Surgeon: Raymon MuttonStephen D Lucey, MD;  Location: [facility] OR;  Service: Orthopedics;  Laterality: Right;  . Quadriceps tendon repair  02/17/2012    Procedure: REPAIR  QUADRICEP TENDON;  Surgeon: Dannielle HuhSteve Lucey, MD;  Location: [facility] OR;  Service: Orthopedics;  Laterality: Right;  right quadriceps repair with latera release  . Tubal ligation    . Quadriceps tendon repair Right 05/15/2012    Procedure: REPAIR QUADRICEP TENDON;  Surgeon: Dannielle HuhSteve Lucey, MD;  [facility];  Service: Orthopedics;  Laterality: Right;   No family history on file. Social History  Substance Use Topics  . Smoking status: Former Smoker -- 0.25 packs/day for 20 years    Types: Cigarettes    Quit date: 06/28/2014  . Smokeless tobacco: Never Used  . Alcohol Use: No   OB History    No data available     Review of Systems  Constitutional: Negative for fever and chills.  HENT: Negative for congestion and rhinorrhea.   Eyes: Negative for redness and visual disturbance.  Respiratory: Negative for shortness of breath and wheezing.   Cardiovascular: Negative for chest pain and palpitations.  Gastrointestinal: Negative for nausea and vomiting.  Genitourinary: Negative for dysuria and urgency.  Musculoskeletal: Positive for myalgias and arthralgias.  Skin: Negative for pallor and wound.  Neurological: Negative for dizziness and headaches.      Allergies  Review of patient's allergies indicates no known allergies.  Home Medications   Prior to Admission  medications   Medication Sig Start Date End Date Taking? Authorizing Provider  aspirin EC 81 MG tablet Take 81 mg by mouth daily.   Yes Historical Provider, MD  fluticasone (FLONASE) 50 MCG/ACT nasal spray Place 2 sprays into the nose daily. Patient taking differently: Place 2 sprays into the nose daily as needed for allergies.  08/05/12  Yes Tatyana Kirichenko, PA-C  insulin glargine (LANTUS) 100 UNIT/ML injection Inject 26 Units into the skin at bedtime.    Yes Historical Provider, MD  oxyCODONE-acetaminophen (PERCOCET) 7.5-325 MG tablet Take 1 tablet by mouth every 4 (four) hours as needed. pain 04/04/15  Yes Historical Provider, MD   rosuvastatin (CRESTOR) 10 MG tablet Take 10 mg by mouth daily.   Yes Historical Provider, MD  valsartan-hydrochlorothiazide (DIOVAN-HCT) 160-25 MG tablet Take 1 tablet by mouth daily. 05/15/15  Yes Historical Provider, MD   BP 155/76 mmHg  Pulse 94  Temp(Src) 98.4 F (36.9 C) (Oral)  Resp 18  SpO2 96% Physical Exam  Constitutional: She is oriented to person, place, and time. She appears well-developed and well-nourished. No distress.  HENT:  Head: Normocephalic and atraumatic.  Eyes: EOM are normal. Pupils are equal, round, and reactive to light.  Neck: Normal range of motion. Neck supple.  Cardiovascular: Normal rate and regular rhythm.  Exam reveals no gallop and no friction rub.   No murmur heard. Pulmonary/Chest: Effort normal. She has no wheezes. She has no rales.  Abdominal: Soft. She exhibits no distension. There is no tenderness.  Musculoskeletal: She exhibits tenderness. She exhibits no edema.  R knee cap is subluxed laterally.  No noted joint effusion. PMS intact distally  Neurological: She is alert and oriented to person, place, and time.  Skin: Skin is warm and dry. She is not diaphoretic.  Psychiatric: She has a normal mood and affect. Her behavior is normal.  Nursing note and vitals reviewed.   ED Course  Procedures (including critical care time) Labs Review Labs Reviewed  CBG MONITORING, ED    Imaging Review Dg Knee Complete 4 Views Right  07/16/2015  CLINICAL DATA:  Knee pain EXAM: RIGHT KNEE - COMPLETE 4+ VIEW COMPARISON:  CT knee 05/05/2012 FINDINGS: Total knee replacement. 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 Low lying patella suggesting quadriceps tendon rupture. There is widening of the medial joint space suggesting ligament injury. This was not present previously. Negative for fracture.  No evidence of prosthetic loosening. IMPRESSION: Total knee replacement with widening of the medial joint space suggesting medial ligament disruption. 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 Electronically Signed   By: Marlan Palauharles  Clark M.D.   On: 07/16/2015 11:34   I have personally reviewed and evaluated these images and lab results as part of my medical decision-making.   EKG Interpretation None      MDM   Final diagnoses:  Patellar tendon rupture, right, initial encounter  Patellar dislocation, right, sequela    70 yo F with right knee pain. No synechia findings on her x-ray. We'll discuss with case management possibility of home health care.  Discussed the case with Dr. Carola FrostHandy, orthopedics. He suggested that no further imaging be performed in the ED. He agreed that she needs to be seen by her primary orthopedic surgeon. We'll place in a knee immobilizer.  3:13 PM:  I have discussed the diagnosis/risks/treatment options with the patient and family and believe the pt to be eligible for discharge home to follow-up with  Ortho. We also discussed returning to the ED immediately if new or worsening sx occur. We discussed the sx which are most concerning (e.g., sudden worsening pain, fever, inability to tolerate by mouth) that necessitate immediate return. Medications administered to the patient during their visit and any new prescriptions provided to the patient are listed below.  Medications given during this visit Medications - No data to display  New Prescriptions   No medications on file    The patient appears reasonably screen and/or stabilized for discharge and I doubt any other medical condition or other [facility] requiring further screening, evaluation, or treatment in the ED at this time prior to discharge.     Melene Plan, DO 07/16/15 1513

## 2015-07-16 NOTE — ED Notes (Signed)
Bed: WA17 Expected date:  Expected time:  Means of arrival:  Comments: 70 yo knee pain

## 2015-07-16 NOTE — Discharge Instructions (Signed)
Follow up with your orthodoctor.  Return for sudden worsening symptoms.  Tendon Injury Tendons are strong, cordlike structures that connect muscle to bone. Tendons are made up of woven fibers, like a rope. A tendon injury is a tear (rupture) of the tendon. The rupture may be partial (only a few of the fibers in your tendon rupture) or complete (your entire tendon ruptures). CAUSES  Tendon injuries can be caused by high-stress activities, such as sports. They also can be caused by a repetitive injury or by a single injury from an excessive, rapid force. SYMPTOMS  Symptoms of tendon injury include pain when you move the joint close to the tendon. Other symptoms are swelling, redness, and warmth. DIAGNOSIS  Tendon injuries often can be diagnosed by physical exam. However, sometimes an X-ray exam or advanced imaging, such as magnetic resonance imaging (MRI), is necessary to determine the extent of the injury. TREATMENT  Partial tendon ruptures often can be treated with immobilization. A splint, bandage, or removable brace usually is used to immobilize the injured tendon. Most injured tendons need to be immobilized for 1-2 months before they are completely healed. Complete tendon ruptures may require surgical reattachment.   This information is not intended to replace advice given to you by your health care provider. Make sure you discuss any questions you have with your health care provider.   Document Released: 02/08/2004 Document Revised: 12/20/2010 Document Reviewed: 03/24/2011 Elsevier Interactive Patient Education [employer]2016 Elsevier Inc.

## 2015-07-16 NOTE — ED Notes (Signed)
Pt reports R knee pain, unable to get up since yesterday.  Pt reports total knee replacement x 4 years ago.  States her patella "had moved away from my knee."  She states that her family wants her to go to a nsg home, but states she does not want to.  However, pt reports she is not able to do things for herself.  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

## 2015-07-16 NOTE — ED Notes (Signed)
Assisted pt to the BR.  Pt required 2 assists and a bar in the BR to pull on to a standing position.  Pt unable to completely stand on her own.  Liu EDP made aware.  She went to speak to pt.

## 2015-07-16 NOTE — ED Notes (Signed)
[facility] arrived for transport.  Pt's granddaughter received a call from her mother, who is pt's POA stating that no one is going to pick pt  Up.  Explained to them that [facility] will be transporting pt.  Granddaughter and pt started to cry.  At this time, [facility] received another call to transport.  Will call them back as soon as pt is ready to be transported.  Went in the room to speak with pt and family, explained to the granddaughter that if her mother, pt's daughter wants certain things to be done, she needs to be here to speak to EDP.  Granddaughter finally got her mother on the phone, as soon as this nurse got on the phone with her, she started to yell and would not let this nurse speak to explain to her what the plan is.  SPoke to pt after attempt to speak to the daughter.  She states "why she acting up?"

## 2015-07-16 NOTE — H&P (Signed)
History and Physical  Mary [name] LKG:[id number] DOB: 09/20/1945 DOA: 07/16/2015  PCP:  Gwynneth AlimentSANDERS,ROBYN N, MD   Chief Complaint:  Right knee pain   History of Present Illness:  Patient is a 70 yo female with history of R.total knee replacement 4 years ago who came with cc of right knee pain and inability to ambulate that started 3 weeks ago and has gotten worse over the last 2 days that she became bedridden. She has had swelling in her right knee and both legs. She stopped taking her blood pressure medications over a week ago. No fever or redness or warmness in her knee. No trauma. No other complaints. Ortho recs was to do outpatient f/u but she could not ambulate and she lives alone so she is being admitted for PT eval / transfer to rehab in am.  Review of Systems:  CONSTITUTIONAL:     No night sweats.  No fatigue.  No fever. No chills. Eyes:                            No visual changes.  No eye pain.  No eye discharge.   ENT:                              No epistaxis.  No sinus pain.  No sore throat.   No congestion. RESPIRATORY:           No cough.  No wheeze.  No hemoptysis.  No dyspnea CARDIOVASCULAR   :  No chest pains.  No palpitations. GASTROINTESTINAL:  No abdominal pain.  No nausea. No vomiting.  No diarrhea. No    constipation.  No hematemesis.  No hematochezia.  No melena. GENITOURINARY:      No urgency.  No frequency.  No dysuria.  No hematuria.  No  obstructive symptoms.  No discharge.  No pain.   MUSCULOSKELETAL:  +musculoskeletal pain.  No joint swelling.  No arthritis. NEUROLOGICAL:        No confusion.  No weakness. No headache. No seizure. PSYCHIATRIC:             No depression. No anxiety. No suicidal ideation. SKIN:                             No rashes.  No lesions.  No wounds. ENDOCRINE:                No weight loss.  No polydipsia.  No polyuria.  No polyphagia. HEMATOLOGIC:           No purpura.  No petechiae.  No bleeding.  ALLERGIC                 : No  pruritus.  No angioedema Other:  Past Medical and Surgical History:   Past Medical History  Diagnosis Date  . Hyperlipemia   . Arthritis     osteoarthritis  . Diabetes mellitus     Insulin dependent  . Hypertension   . H/O cardiovascular stress test     pt. reports 10/13- was normal per pt.    Past Surgical History  Procedure Laterality Date  . Carpal tunnel release Bilateral   . Knee arthroscopy      right  . 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 Total knee arthroplasty  10/21/2011  Procedure: TOTAL KNEE ARTHROPLASTY;  Surgeon: Raymon MuttonStephen D Lucey, MD;  [facility];  Service: Orthopedics;  Laterality: Right;  . Quadriceps tendon repair  02/17/2012    Procedure: REPAIR QUADRICEP TENDON;  Surgeon: Dannielle HuhSteve Lucey, MD;  Location: [facility] OR;  Service: Orthopedics;  Laterality: Right;  right quadriceps repair with latera release  . Tubal ligation    . Quadriceps tendon repair Right 05/15/2012    Procedure: REPAIR QUADRICEP TENDON;  Surgeon: Dannielle HuhSteve Lucey, MD;  [facility];  Service: Orthopedics;  Laterality: Right;    Social History:   reports that she quit smoking about 12 months ago. Her smoking use included Cigarettes. She has a 5 pack-year smoking history. She has never used smokeless tobacco. She reports that she does not drink alcohol or use illicit drugs.    No Known Allergies  No family history on file.    Prior to Admission medications   Medication Sig Start Date End Date Taking? Authorizing Provider  aspirin EC 81 MG tablet Take 81 mg by mouth daily.   Yes Historical Provider, MD  fluticasone (FLONASE) 50 MCG/ACT nasal spray Place 2 sprays into the nose daily. Patient taking differently: Place 2 sprays into the nose daily as needed for allergies.  08/05/12  Yes Tatyana Kirichenko, PA-C  insulin glargine (LANTUS) 100 UNIT/ML injection Inject 26 Units into the skin at bedtime.    Yes Historical Provider, MD  oxyCODONE-acetaminophen (PERCOCET) 7.5-325 MG tablet Take  1 tablet by mouth every 4 (four) hours as needed. pain 04/04/15  Yes Historical Provider, MD  rosuvastatin (CRESTOR) 10 MG tablet Take 10 mg by mouth daily.   Yes Historical Provider, MD  valsartan-hydrochlorothiazide (DIOVAN-HCT) 160-25 MG tablet Take 1 tablet by mouth daily. 05/15/15  Yes Historical Provider, MD    Physical Exam: BP 155/76 mmHg  Pulse 94  Temp(Src) 98.4 F (36.9 C) (Oral)  Resp 18  SpO2 96%  GENERAL :   Alert and cooperative, and appears to be in no acute distress. HEAD:           normocephalic. EYES:            PERRL, EOMI.  vision is grossly intact. EARS:           hearing grossly intact. NOSE:           No nasal discharge. THROAT:     Oral cavity and pharynx normal.   NECK:          supple, non-tender.  CARDIAC:    Normal S1 and S2. No gallop. No murmurs.  Vascular:     ++peripheral edema.  LUNGS:       Clear to auscultation  ABDOMEN: Positive bowel sounds. Soft, nondistended, nontender. No guarding or rebound.      MSK:           No joint erythema or tenderness.  EXT           : Right knee ,swollen, lateral dislocation of the patella, restricted ROM but no pain on passive movement. Unable to put pressure on right leg.  Neuro        : Alert, oriented to person, place, and time.                      CN II-XII intact.                        SKIN:  No rash. No lesions. PSYCH:       No hallucination. Patient is not suicidal.          Labs on Admission:  Reviewed.   Radiological Exams on Admission: Dg Knee Complete 4 Views Right  07/16/2015  CLINICAL DATA:  Knee pain EXAM: RIGHT KNEE - COMPLETE 4+ VIEW COMPARISON:  CT knee 05/05/2012 FINDINGS: Total knee replacement. 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 Low lying patella suggesting quadriceps tendon rupture. There is widening of the medial joint space suggesting ligament injury. This was not present previously. Negative for fracture.  No evidence of prosthetic loosening. IMPRESSION: Total  knee replacement with widening of the medial joint space suggesting medial ligament disruption. This was not present previously Chronic lateral dislocation the patella with probable quadriceps tendon rupture. Electronically Signed   By: Marlan Palau M.D.   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  No evidence of infectious arthritis.   HTN: cont home meds  DMII: cont home meds with glycemic control protocol.   LE edema: likely due to diastolic dysfunction related to chronic HTN and non compliance with meds. Will check [place] to r/o DVT.  Input & Output: NA Lines & Tubes: PIV DVT prophylaxis: Williams enoxaparin  GI prophylaxis: NA Consultants: PT Code Status: Full  Family Communication: At bedside  Disposition Plan: Obs    Eston Esters M.D [facility]

## 2015-07-16 NOTE — ED Provider Notes (Signed)
70 year old female who presents with progressive right knee pain. She expressed difficulty ambulating with walker at home, unable to use bathroom and feed herself. Her family does not help her much at home. Xr with chronic lateral dislocation of patella. Initially previous provider spoke with orthopedic surgery, who recommended outpatient ortho f/u. Initially family took her keys to prevent discharge, but she was able to get back keys and patient was slotted for discharge. However, she is unable to ambulate well. She is unable to transfer from chair to standing, unable to wipe and get off toilet by herself, even though she may be stable with ambulation with walker. Home health is set up for her, but with fourth of July weekend afraid that she will not do well at home and that she may actually require rehab facility. Discussed with Dr. Mickle MalloryHamad, who will admit for placement into rehab facility.   Lavera Guiseana Duo Adriaan Maltese, MD 07/16/15 [phone]

## 2015-07-16 NOTE — Care Management Note (Signed)
Case Management Note  Patient Details  Name: Mary [name] MRN: [id number] Date of Birth: 03/19/1945  Subjective/Objective:  70 y.o. F seen in the ED for Knee pain. MD placed [facility] as pt is having difficulty with ambulation and children are encouraging her to go to [facility]. Pt is not ready for this but is receptive to HHPT and RN. Pt reports she has RW and 3N1. Asked about Bed, and I discussed the process but discouraged, and pt agreed as she needs to continue to be as ambulatory as possible and a bed in the home usually discourages this process. CM spoke with MD who understands we will need orders for F2F, HHPT and HHRN. Alerted Tiffany with [facility], whom the pt has chosen, to be on the lookout for [facility]. Pt plans to call [name], MD for follow-up with Orthopaedic surgery on opposite knee in the near future.                  Action/Plan:No further CM needs at this time but will be available should additional discharge/disposition needs arise.    Expected Discharge Date:                  Expected Discharge Plan:  Home w Home Health Services  In-House Referral:     Discharge planning Services  CM Consult  Post Acute Care Choice:    Choice offered to:  Patient  DME Arranged:    DME Agency:     HH Arranged:  RN, PT HH Agency:  [facility]  Status of Service:  Completed, signed off  If discussed at Long Length of Stay Meetings, dates discussed:    Additional Comments:  Yvone NeuCrutchfield, Jarron Curley M, RN 07/16/2015, 12:24 PM

## 2015-07-16 NOTE — ED Notes (Signed)
Jon Ortho tech at bedside. 

## 2015-07-16 NOTE — ED Notes (Signed)
Pt's granddaughter states that pt needs

## 2015-07-17 ENCOUNTER — Observation Stay (HOSPITAL_BASED_OUTPATIENT_CLINIC_OR_DEPARTMENT_OTHER): Payer: Medicare Other

## 2015-07-17 DIAGNOSIS — Z7982 Long term (current) use of aspirin: Secondary | ICD-10-CM | POA: Diagnosis not present

## 2015-07-17 DIAGNOSIS — R7611 Nonspecific reaction to tuberculin skin test without active tuberculosis: Secondary | ICD-10-CM | POA: Diagnosis not present

## 2015-07-17 DIAGNOSIS — R609 Edema, unspecified: Secondary | ICD-10-CM | POA: Diagnosis not present

## 2015-07-17 DIAGNOSIS — G894 Chronic pain syndrome: Secondary | ICD-10-CM | POA: Diagnosis not present

## 2015-07-17 DIAGNOSIS — E119 Type 2 diabetes mellitus without complications: Secondary | ICD-10-CM

## 2015-07-17 DIAGNOSIS — Z794 Long term (current) use of insulin: Secondary | ICD-10-CM | POA: Diagnosis not present

## 2015-07-17 DIAGNOSIS — Z87891 Personal history of nicotine dependence: Secondary | ICD-10-CM | POA: Diagnosis not present

## 2015-07-17 DIAGNOSIS — M6281 Muscle weakness (generalized): Secondary | ICD-10-CM | POA: Diagnosis not present

## 2015-07-17 DIAGNOSIS — M2201 Recurrent dislocation of patella, right knee: Secondary | ICD-10-CM | POA: Diagnosis not present

## 2015-07-17 DIAGNOSIS — R600 Localized edema: Secondary | ICD-10-CM | POA: Diagnosis not present

## 2015-07-17 DIAGNOSIS — M25561 Pain in right knee: Secondary | ICD-10-CM

## 2015-07-17 DIAGNOSIS — M25569 Pain in unspecified knee: Secondary | ICD-10-CM | POA: Diagnosis not present

## 2015-07-17 DIAGNOSIS — Z9114 Patient's other noncompliance with medication regimen: Secondary | ICD-10-CM | POA: Diagnosis not present

## 2015-07-17 DIAGNOSIS — I10 Essential (primary) hypertension: Secondary | ICD-10-CM

## 2015-07-17 DIAGNOSIS — M150 Primary generalized (osteo)arthritis: Secondary | ICD-10-CM | POA: Diagnosis not present

## 2015-07-17 DIAGNOSIS — E785 Hyperlipidemia, unspecified: Secondary | ICD-10-CM | POA: Diagnosis not present

## 2015-07-17 DIAGNOSIS — Z79899 Other long term (current) drug therapy: Secondary | ICD-10-CM | POA: Diagnosis not present

## 2015-07-17 LAB — GLUCOSE, CAPILLARY
Glucose-Capillary: 85 mg/dL (ref 65–99)
Glucose-Capillary: 107 mg/dL — ABNORMAL HIGH (ref 65–99)

## 2015-07-17 LAB — BRAIN NATRIURETIC PEPTIDE: B Natriuretic Peptide: 22.7 pg/mL (ref 0.0–100.0)

## 2015-07-17 LAB — D-DIMER, QUANTITATIVE (NOT AT ARMC): D DIMER QUANT: 0.38 ug{FEU}/mL (ref 0.00–0.50)

## 2015-07-17 MED ORDER — OXYCODONE-ACETAMINOPHEN 7.5-325 MG PO TABS: 1.0000 | ORAL_TABLET | ORAL | Status: DC | PRN

## 2015-07-17 NOTE — Clinical Social Work Placement (Signed)
   CLINICAL SOCIAL WORK PLACEMENT  NOTE  Date:  07/17/2015  Patient Details  Name: Mary [name] MRN: [id number] Date of Birth: 07/22/1945  Clinical Social Work is seeking post-discharge placement for this patient at the Skilled  Nursing Facility level of care (*CSW will initial, date and re-position this form in  chart as items are completed):  Yes   Patient/family provided with [place] Clinical Social Work Department's list of facilities offering this level of care within the geographic area requested by the patient (or if unable, by the patient's family).  Yes   Patient/family informed of their freedom to choose among providers that offer the needed level of care, that participate in Medicare, Medicaid or managed care program needed by the patient, have an available bed and are willing to accept the patient.  Yes   Patient/family informed of [facility]'s ownership interest in [facility] and [facility], as well as of the fact that they are under no obligation to receive care at these facilities.  PASRR submitted to EDS on 07/17/15     PASRR number received on 07/17/15     Existing PASRR number confirmed on       FL2 transmitted to all facilities in geographic area requested by pt/family on 07/17/15     FL2 transmitted to all facilities within larger geographic area on       Patient informed that his/her managed care company has contracts with or will negotiate with certain facilities, including the following:        Yes   Patient/family informed of bed offers received.  [facility]     Physician recommends and patient chooses bed at      [facility] 07[facility].  Patient to be transferred to facility by [facility]     Patient family notified on 07/17/15 of transfer.  Name of family member notified:  DAUGHTER     PHYSICIAN       Additional Comment: Pt / daughter are in agreement with [facility]. [facility] transport required. Pt / daughter are aware out of pocket costs may be associated with [facility] transport. D/C summary sent to SNF for review. Scripts included in d/c packet. # for report provided to nsg.   _______________________________________________ Royetta AsalHaidinger, Earla Charlie Lee, LCSW  [phone] 07/17/2015, 3:53 PM

## 2015-07-17 NOTE — Evaluation (Signed)
Physical Therapy Evaluation Patient Details Name: Mary [name] MRN: [id number] DOB: 12/27/1945 Today's Date: 07/17/2015   History of Present Illness  Patient is a 70 yo female with history of R total knee replacement 4 years ago who came with cc of right knee pain and inability to ambulate that started 3 weeks ago and has gotten worse over the last 2 days that she became bedridden. She has had swelling in her right knee and both legs;   PMHx:  R TKA,   quad tendon repair x2  Clinical Impression  Pt admitted with above diagnosis. Pt currently with functional limitations due to the deficits listed below (see PT Problem List).  Pt will benefit from skilled PT to increase their independence and safety with mobility to allow discharge to the venue listed below.   Pt will benefit from SNF level therapies, she has struggling with transfers and ADLs at home and is at risk for falls d/t deconditioning and severe RLE weakness     Follow Up Recommendations SNF    Equipment Recommendations  None recommended by PT    Recommendations for Other Services       Precautions / Restrictions Precautions Precautions: Fall Restrictions Weight Bearing Restrictions: No      Mobility  Bed Mobility Overal bed mobility: Needs Assistance Bed Mobility: Supine to Sit     Supine to sit: Min assist     General bed mobility comments: assist with RLE and trunk  Transfers Overall transfer level: Needs assistance Equipment used: Rolling walker (2 wheeled) Transfers: Sit to/from Stand Sit to Stand: Mod assist;From elevated surface         General transfer comment: assist with anterior wt shift, requires incr time  Ambulation/Gait Ambulation/Gait assistance: Min assist Ambulation Distance (Feet): 22 Feet Assistive device: Rolling walker (2 wheeled) Gait Pattern/deviations: Step-to pattern;Antalgic;Trunk flexed;Narrow base of support     General Gait Details: cues for RW safety, incr time, heavy  use of UEs; pt has to lock her R knee in extension d/t LE weakness to remain standing  Stairs            Wheelchair Mobility    Modified Rankin (Stroke Patients Only)       Balance Overall balance assessment: Needs assistance   Sitting balance-Leahy Scale: Fair       Standing balance-Leahy Scale: Poor Standing balance comment: reliant on UEs for balance                             Pertinent Vitals/Pain Faces Pain Scale: Hurts a little bit Pain Location: R knee with movement Pain Descriptors / Indicators: Sore Pain Intervention(s): Limited activity within patient's tolerance;Monitored during session    Home Living Family/patient expects to be discharged to:: Skilled nursing facility Living Arrangements: Alone                    Prior Function Level of Independence: Needs assistance   Gait / Transfers Assistance Needed: amb with RW; can't stand to cook meals, transfers were difficult per pt  ADL's / Homemaking Assistance Needed: sponge bathes; dresses I'ly        Hand Dominance        Extremity/Trunk Assessment   Upper Extremity Assessment: Generalized weakness           Lower Extremity Assessment: Generalized weakness;RLE deficits/detail RLE Deficits / Details: ankle df  to neutral, 3/5; knee extension 2/5; hip flexors 2+/5  Communication   Communication: No difficulties  Cognition Arousal/Alertness: Awake/alert Behavior During Therapy: WFL for tasks assessed/performed Overall Cognitive Status: Within Functional Limits for tasks assessed                      General Comments      Exercises        Assessment/Plan    PT Assessment Patient needs continued PT services  PT Diagnosis Difficulty walking   PT Problem List Decreased strength;Decreased activity tolerance;Decreased range of motion;Decreased mobility;Decreased knowledge of use of DME;Decreased balance  PT Treatment Interventions DME  instruction;Gait training;Functional mobility training;Therapeutic activities;Therapeutic exercise;Patient/family education   PT Goals (Current goals can be found in the Care Plan section) Acute Rehab PT Goals Patient Stated Goal: get stronger PT Goal Formulation: With patient Time For Goal Achievement: 07/31/15 Potential to Achieve Goals: Good    Frequency Min 3X/week   Barriers to discharge        Co-evaluation               End of Session Equipment Utilized During Treatment: Gait belt Activity Tolerance: Patient limited by fatigue Patient left: with call bell/phone within reach;in chair;with family/visitor present      Functional Assessment Tool Used: clincal observaiton Functional Limitation: Mobility: Walking and moving around Mobility: Walking and Moving Around Current Status ([id number]([id number]): At least 20 percent but less than 40 percent impaired, limited or restricted Mobility: Walking and Moving Around Goal Status [phone]): At least 1 percent but less than 20 percent impaired, limited or restricted    Time: 1215-1232 PT Time Calculation (min) (ACUTE ONLY): 17 min   Charges:   PT Evaluation $PT Eval Low Complexity: 1 Procedure     PT G Codes:   PT G-Codes **NOT FOR INPATIENT CLASS** Functional Assessment Tool Used: clincal observaiton Functional Limitation: Mobility: Walking and moving around Mobility: Walking and Moving Around Current Status ([id number]([id number]): At least 20 percent but less than 40 percent impaired, limited or restricted Mobility: Walking and Moving Around Goal Status [phone]([phone]): At least 1 percent but less than 20 percent impaired, limited or restricted    [facility] LLC[facility]ILLIAMS,Benicia Bergevin 07/17/2015, 1:55 PM

## 2015-07-17 NOTE — Clinical Social Work Placement (Signed)
   CLINICAL SOCIAL WORK PLACEMENT  NOTE  Date:  07/17/2015  Patient Details  Name: Mary [name] MRN: [id number] Date of Birth: 04/05/1945  Clinical Social Work is seeking post-discharge placement for this patient at the Skilled  Nursing Facility level of care (*CSW will initial, date and re-position this form in  chart as items are completed):  Yes   Patient/family provided with [place] Clinical Social Work Department's list of facilities offering this level of care within the geographic area requested by the patient (or if unable, by the patient's family).  Yes   Patient/family informed of their freedom to choose among providers that offer the needed level of care, that participate in Medicare, Medicaid or managed care program needed by the patient, have an available bed and are willing to accept the patient.  Yes   Patient/family informed of [facility]'s ownership interest in [facility] and [facility], as well as of the fact that they are under no obligation to receive care at these facilities.  PASRR submitted to EDS on 07/17/15     PASRR number received on 07/17/15     Existing PASRR number confirmed on       FL2 transmitted to all facilities in geographic area requested by pt/family on 07/17/15     FL2 transmitted to all facilities within larger geographic area on       Patient informed that his/her managed care company has contracts with or will negotiate with certain facilities, including the following:        Yes   Patient/family informed of bed offers received.  [facility]     Physician recommends and patient chooses bed at      [facility]  .  Patient to be transferred to facility by       Patient family notified on   of transfer.  Name of family member notified:        PHYSICIAN       Additional Comment:    _______________________________________________ Royetta AsalHaidinger,  Kathrynn Backstrom Lee, LCSW  [phone] 07/17/2015, 11:47 AM

## 2015-07-17 NOTE — Discharge Summary (Addendum)
Discharge Summary  Mary [name] ZOX:[id number] DOB: 01/21/1945  PCP: Gwynneth AlimentSANDERS,ROBYN N, MD  Admit date: 07/16/2015 Discharge date: 07/17/2015  Time spent: 25 minutes  Recommendations for Outpatient Follow-up:  1. Patient being discharged to short-term skilled nursing 2. Patient has follow-up appointment with orthopedic surgery on 7/25   Discharge Diagnoses:  Active Hospital Problems   Diagnosis Date Noted  . Knee pain 07/16/2015  Morbid obesity Right patellar dislocation chronic Right knee medial ligament disruption Diabetes mellitus type 2 controlled Hypertension  Resolved Hospital Problems   Diagnosis Date Noted Date Resolved  No resolved problems to display.    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 Patient brought into the emergency room on 7/2. At that point knee evaluated. Lab stable. No evidence of infection or clot. Noted swelling in right knee. 4 view x-ray noted chronic patellar translocation and widening of medial joint space suggesting medial ligament disruption not there previously. Emergency room spoke with orthopedic surgery and initial plan was for outpatient follow-up. However patient was unable to ambulate even getting up so hospitalists were called for further evaluation and observation.  Hospital Course:  Active Problems:   Knee pain in patient  with history of right total knee replacement and new issues involving medial ligament disruption and difficulty with ambulation: Patient evaluated by physical therapy.  Recommendation is for short-term skilled nursing. Patient improved and will go on 7/3. She'll follow-up with orthopedic surgery with already scheduled appointment on 7/25. Medication for pain as needed.  Essential hypertension: Stable, continue home medications  Diabetes mellitus type 2: Controlled. Continue insulin  Morbid obesity: Patient meets criteria with BMI greater than 40  Procedures:  None   Consultations:  Discussed with orthopedic surgery   Discharge Exam: BP 121/75 mmHg  Pulse 103  Temp(Src) 98 F (36.7 C) (Oral)  Resp 18  Ht 5\' 7"  (1.702 m)  Wt 121.4 kg (267 lb 10.2 oz)  BMI 41.91 kg/m2  SpO2 100%  General: Alert and oriented 3, no acute distress  Cardiovascular: Regular rate and rhythm, S1-S2  Respiratory: Clear to auscultation bilaterally Abdomen soft, nontender, nondistended, positive bowel sounds Extremities: Trace pitting edema bilaterally below the knees, right knee slightly tender, no evidence of erythema or significant warmth or calf pain or other findings to suggest concerns for DVT or infection  Discharge Instructions You were cared for by a hospitalist during your hospital stay. If you have any questions about your discharge medications or the care you received while you were in the hospital after you are discharged, you can call the unit and asked to speak with the hospitalist on call if the hospitalist that took care of you is not available. Once you are discharged, your primary care physician will handle any further medical issues. Please note that NO REFILLS for any discharge medications will be authorized once  you are discharged, as it is imperative that you return to your primary care physician (or establish a relationship with a primary care physician if you do not have one) for your  aftercare needs so that they can reassess your need for medications and monitor your lab values.  Discharge Instructions    Face-to-face encounter (required for Medicare/Medicaid patients)    Complete by:  As directed   I Rae Roam certify that this patient is under my care and that I, or a nurse practitioner or physician's assistant working with me, had a face-to-face encounter that meets the physician face-to-face encounter requirements with this patient on 07/16/2015. The encounter with the patient was in whole, or in part for the following medical condition(s) which is the primary reason for home health care (List medical condition): Right patellar dislocation and quadriceps tendon rupture.  This has lead to decreased mobility at home.  Patient has partially deconditioned to where she has trouble getting around the house on her own or taking care of herself.  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  Patient with limited mobility  I certify that, based on my findings, the following services are medically necessary home health services:   Nursing Physical therapy    Reason for Medically Necessary Home Health Services:   Skilled Nursing- Change/Decline in Patient Status Therapy- Investment banker, operational, Patent examiner Therapy- Instruction on use of Assistive Device for Ambulation on all Surfaces Therapy- Instruction on Safe use of Assistive Devices for ADLs Therapy- Home Adaptation to Facilitate Safety Therapy- Therapeutic Exercises to Increase Strength and Endurance    My clinical findings support the need for the above services:   Pain interferes with ambulation/mobility Unable to leave home safely without assistance and/or assistive device Bedbound    Further, I certify that my clinical findings support that this  patient is homebound due to:  Pain interferes with ambulation/mobility     Home Health    Complete by:  As directed   R knee disability post surgery, patient needs help with mobility  To provide the following care/treatments:   PT OT Home Health Aide RN              Medication List    TAKE these medications        aspirin EC 81 MG tablet  Take 81 mg by mouth daily.     fluticasone 50 MCG/ACT nasal spray  Commonly known as:  FLONASE  Place 2 sprays into the nose daily.     insulin glargine 100 UNIT/ML injection  Commonly known as:  LANTUS  Inject 26 Units into the skin at bedtime.     oxyCODONE-acetaminophen 7.5-325 MG tablet  Commonly known as:  PERCOCET  Take 1 tablet by mouth every 4 (four) hours as needed. pain     rosuvastatin 10 MG tablet  Commonly known as:  CRESTOR  Take 10 mg by mouth daily.     valsartan-hydrochlorothiazide 160-25 MG tablet  Commonly known as:  DIOVAN-HCT  Take 1 tablet by mouth daily.       No Known Allergies     Follow-up Information    Follow up with LUCEY,STEPHEN D, MD. Schedule an appointment as soon as possible for a visit in 1 week.   Specialty:  Orthopedic Surgery   Contact information:  [address] [place] [address] [phone]        The results of significant diagnostics from this hospitalization (including imaging, microbiology, ancillary and laboratory) are listed below for reference.    Significant Diagnostic Studies: Dg Chest 2 View  07/08/2015  CLINICAL DATA:  Shortness breath EXAM: CHEST  2 VIEW COMPARISON:  10/11/2011 chest radiograph. FINDINGS: Mild enlargement of the cardiac silhouette, increased. Otherwise normal mediastinal contour. No pneumothorax. No pleural effusion. New mild pulmonary edema. No acute consolidative airspace disease. IMPRESSION: Mild enlargement of the cardiac silhouette, increased. Mild pulmonary edema. Findings suggest mild congestive heart failure. Electronically Signed    By: Delbert PhenixJason A Poff M.D.   On: 07/08/2015 14:30   Dg Knee Complete 4 Views Right  07/16/2015  CLINICAL DATA:  Knee pain EXAM: RIGHT KNEE - COMPLETE 4+ VIEW COMPARISON:  CT knee 05/05/2012 FINDINGS: Total knee replacement. 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 Low lying patella suggesting quadriceps tendon rupture. There is widening of the medial joint space suggesting ligament injury. This was not present previously. Negative for fracture.  No evidence of prosthetic loosening. IMPRESSION: Total knee replacement with widening of the medial joint space suggesting medial ligament disruption. This was not present previously Chronic lateral dislocation the patella with probable quadriceps tendon rupture. Electronically Signed   By: Marlan Palauharles  Clark M.D.   On: 07/16/2015 11:34    Microbiology: No results found for this or any previous visit (from the past 240 hour(s)).   Labs: Basic Metabolic Panel: No results for input(s): NA, K, CL, CO2, GLUCOSE, BUN, CREATININE, CALCIUM, MG, PHOS in the last 168 hours. Liver Function Tests: No results for input(s): AST, ALT, ALKPHOS, BILITOT, PROT, ALBUMIN in the last 168 hours. No results for input(s): LIPASE, AMYLASE in the last 168 hours. No results for input(s): AMMONIA in the last 168 hours. CBC: No results for input(s): WBC, NEUTROABS, HGB, HCT, MCV, PLT in the last 168 hours. Cardiac Enzymes: No results for input(s): CKTOTAL, CKMB, CKMBINDEX, TROPONINI in the last 168 hours. BNP: BNP (last 3 results) No results for input(s): BNP in the last 8760 hours.  ProBNP (last 3 results) No results for input(s): PROBNP in the last 8760 hours.  CBG:  Recent Labs Lab 07/16/15 1136 07/16/15 2146 07/17/15 0750 07/17/15 1201  GLUCAP 94 152* 85 107*       Signed:  Hollice EspyKRISHNAN,Jakaila Norment K, MD [facility] 07/17/2015, 2:49 PM

## 2015-07-17 NOTE — Clinical Social Work Note (Signed)
Clinical Social Work Assessment  Patient Details  Name: Mary [name] MRN: [id number] Date of Birth: 06/05/1945  Date of referral:  07/17/15               Reason for consult:  Facility Placement, Discharge Planning                Permission sought to share information with:  Chartered certified accountant granted to share information::  Yes, Verbal Permission Granted  Name::        Agency::     Relationship::     Contact Information:     Housing/Transportation Living arrangements for the past 2 months:  Single Family Home Source of Information:  Patient Patient Interpreter Needed:  None Criminal Activity/Legal Involvement Pertinent to Current Situation/Hospitalization:  No - Comment as needed Significant Relationships:  Adult Children Lives with:  Self Do you feel safe going back to the place where you live?  No Need for family participation in patient care:  No (Coment)  Care giving concerns:  Pt's care cannot be managed at home following hospital d/c.   Social Worker assessment / plan:  Pt hospitalized on 07/16/15 with a right patella dislocation. Pt is under observation status. PN reviewed. PT evaluation is pending. CSW met with pt and spoke with pt's daughter, Ms. Sebastiano [phone] ( with pt's permission ). Pt feels ST Rehab is needed at d/c. SNF search initiated and bed offers provided. Pt has chosen [facility] for rehab. SNF contacted and is able to admit when pt is ready for d/c. Pt understands bed offer is pending PT recommendations for rehab. CSW will continue to follow to assist with d/c planning needs.  Employment status:  Retired Nurse, adult PT Recommendations:   (PT evaluation is pending.) Information / Referral to community resources:  [place]  Patient/Family's Response to care: Pt / daughter feel ST Rehab is needed.  Patient/Family's Understanding of and Emotional Response to Diagnosis, Current Treatment,  and Prognosis:  Pt is aware of her medical status. " I'm going to need rehab. I can't get around on my own. " Support / emotional support provided.  Emotional Assessment Appearance:  Appears stated age Attitude/Demeanor/Rapport:  Other (cooperative) Affect (typically observed):  Pleasant, Calm, Appropriate Orientation:  Oriented to Self, Oriented to Place, Oriented to  Time, Oriented to Situation Alcohol / Substance use:  Not Applicable Psych involvement (Current and /or in the community):  No (Comment)  Discharge Needs  Concerns to be addressed:  Discharge Planning Concerns Readmission within the last 30 days:  No Current discharge risk:  None Barriers to Discharge:  No Barriers Identified   Luretha Rued, [place] 07/17/2015, 11:22 AM

## 2015-07-17 NOTE — Progress Notes (Signed)
VASCULAR LAB PRELIMINARY  PRELIMINARY  PRELIMINARY  PRELIMINARY  Bilateral lower extremity venous duplex completed.    Preliminary report:  Bilateral:  No evidence of DVT, superficial thrombosis, or Baker's Cyst.   Admire Bunnell, RVS 07/17/2015, 4:53 PM

## 2015-07-17 NOTE — NC FL2 (Signed)
[place] MEDICAID FL2 LEVEL OF CARE SCREENING TOOL     IDENTIFICATION  Patient Name: Mary [name] Birthdate: 02/27/1945 Sex: female Admission Date (Current Location): 07/16/2015  [facility] [place]   Facility and Address:  [facility],  [address], [place] [id number]      Provider Number: [id number]  Attending Physician Name and Address:  Hollice EspySendil K Krishnan, MD  Relative Name and Phone Number:       Current Level of Care: Hospital Recommended Level of Care: Skilled Nursing Facility Prior Approval Number:    Date Approved/Denied:   PASRR Number: [id number]  Discharge Plan: SNF    Current Diagnoses: Patient Active Problem List   Diagnosis Date Noted  . Knee pain 07/16/2015    Orientation RESPIRATION BLADDER Height & Weight     Self, Time, Situation, Place  Normal Continent Weight: 121.4 kg (267 lb 10.2 oz) Height:  5\' 7"  (170.2 cm)  BEHAVIORAL SYMPTOMS/MOOD NEUROLOGICAL BOWEL NUTRITION STATUS  Other (Comment) (no behaviors)   Continent Diet (carb mod)  AMBULATORY STATUS COMMUNICATION OF NEEDS Skin   Extensive Assist Verbally Other (Comment) (diabetic foot ulcer, right toe)                       Personal Care Assistance Level of Assistance  Bathing, Feeding, Dressing Bathing Assistance: Limited assistance Feeding assistance: Independent Dressing Assistance: Limited assistance     Functional Limitations Info  Sight, Hearing, Speech Sight Info: Adequate Hearing Info: Adequate Speech Info: Adequate    SPECIAL CARE FACTORS FREQUENCY  PT (By licensed PT), OT (By licensed OT)     PT Frequency: 5-6 x wk OT Frequency: 5-6 x wk            Contractures Contractures Info: Not present    Additional Factors Info  Code Status Code Status Info: Full Code             Current Medications (07/17/2015):  This is the current hospital active medication list Current Facility-Administered Medications  Medication Dose  Route Frequency Provider Last Rate Last Dose  . aspirin EC tablet 81 mg  81 mg Oral Daily Eston EstersAhmad Hamad, MD   81 mg at 07/17/15 0817  . enoxaparin (LOVENOX) injection 40 mg  40 mg Subcutaneous Q24H Eston EstersAhmad Hamad, MD   40 mg at 07/16/15 2141  . fluticasone (FLONASE) 50 MCG/ACT nasal spray 2 spray  2 spray Each Nare Daily Eston EstersAhmad Hamad, MD      . irbesartan (AVAPRO) tablet 150 mg  150 mg Oral Daily Eston EstersAhmad Hamad, MD   150 mg at 07/17/15 0816   And  . hydrochlorothiazide (HYDRODIURIL) tablet 25 mg  25 mg Oral Daily Eston EstersAhmad Hamad, MD   25 mg at 07/17/15 0817  . insulin aspart (novoLOG) injection 0-9 Units  0-9 Units Subcutaneous TID WC Eston EstersAhmad Hamad, MD   0 Units at 07/17/15 0817  . insulin glargine (LANTUS) injection 26 Units  26 Units Subcutaneous QHS Eston EstersAhmad Hamad, MD   26 Units at 07/16/15 2142  . oxyCODONE-acetaminophen (PERCOCET) 7.5-325 MG per tablet 1 tablet  1 tablet Oral Q4H PRN Eston EstersAhmad Hamad, MD   1 tablet at 07/17/15 0817  . rosuvastatin (CRESTOR) tablet 10 mg  10 mg Oral Daily Eston EstersAhmad Hamad, MD   10 mg at 07/17/15 [id number]     Discharge Medications: Please see discharge summary for a list of discharge medications.  Relevant Imaging Results:  Relevant Lab Results:   Additional Information SS #  [id number]  Raffaela Ladley, Dickey GaveJamie Lee, LCSW

## 2015-07-17 NOTE — Progress Notes (Signed)
[facility], [place]

## 2015-07-17 NOTE — Progress Notes (Signed)
CSW assisting with d/c planning. PT has recommended ST Rehab at d/c. [facility] is able to accept pt to day if stable for d/c. CSW will assist with d/c planning to SNF.  Cori RazorJamie Tramond Slinker LCSW [phone]

## 2015-07-26 DIAGNOSIS — R609 Edema, unspecified: Secondary | ICD-10-CM | POA: Diagnosis not present

## 2015-07-27 DIAGNOSIS — R600 Localized edema: Secondary | ICD-10-CM | POA: Diagnosis not present

## 2015-07-27 DIAGNOSIS — M25561 Pain in right knee: Secondary | ICD-10-CM | POA: Diagnosis not present

## 2015-07-27 DIAGNOSIS — I10 Essential (primary) hypertension: Secondary | ICD-10-CM | POA: Diagnosis not present

## 2015-07-27 DIAGNOSIS — E119 Type 2 diabetes mellitus without complications: Secondary | ICD-10-CM | POA: Diagnosis not present

## 2015-08-01 DIAGNOSIS — G894 Chronic pain syndrome: Secondary | ICD-10-CM | POA: Diagnosis not present

## 2015-08-01 DIAGNOSIS — Z79899 Other long term (current) drug therapy: Secondary | ICD-10-CM | POA: Diagnosis not present

## 2015-08-01 DIAGNOSIS — M150 Primary generalized (osteo)arthritis: Secondary | ICD-10-CM | POA: Diagnosis not present

## 2015-08-01 DIAGNOSIS — M25561 Pain in right knee: Secondary | ICD-10-CM | POA: Diagnosis not present

## 2015-08-02 DIAGNOSIS — R609 Edema, unspecified: Secondary | ICD-10-CM | POA: Diagnosis not present

## 2015-08-02 DIAGNOSIS — M6281 Muscle weakness (generalized): Secondary | ICD-10-CM | POA: Diagnosis not present

## 2015-08-10 DIAGNOSIS — M6281 Muscle weakness (generalized): Secondary | ICD-10-CM | POA: Diagnosis not present

## 2015-08-10 DIAGNOSIS — I10 Essential (primary) hypertension: Secondary | ICD-10-CM | POA: Diagnosis not present

## 2015-08-10 DIAGNOSIS — E119 Type 2 diabetes mellitus without complications: Secondary | ICD-10-CM | POA: Diagnosis not present

## 2015-08-10 DIAGNOSIS — E785 Hyperlipidemia, unspecified: Secondary | ICD-10-CM | POA: Diagnosis not present

## 2015-08-14 DIAGNOSIS — R600 Localized edema: Secondary | ICD-10-CM | POA: Diagnosis not present

## 2015-08-14 DIAGNOSIS — E785 Hyperlipidemia, unspecified: Secondary | ICD-10-CM | POA: Diagnosis not present

## 2015-08-14 DIAGNOSIS — Z96651 Presence of right artificial knee joint: Secondary | ICD-10-CM | POA: Diagnosis not present

## 2015-08-14 DIAGNOSIS — M23631 Other spontaneous disruption of medial collateral ligament of right knee: Secondary | ICD-10-CM | POA: Diagnosis not present

## 2015-08-14 DIAGNOSIS — M6281 Muscle weakness (generalized): Secondary | ICD-10-CM | POA: Diagnosis not present

## 2015-08-14 DIAGNOSIS — E119 Type 2 diabetes mellitus without complications: Secondary | ICD-10-CM | POA: Diagnosis not present

## 2015-08-14 DIAGNOSIS — M2201 Recurrent dislocation of patella, right knee: Secondary | ICD-10-CM | POA: Diagnosis not present

## 2015-08-14 DIAGNOSIS — Z7982 Long term (current) use of aspirin: Secondary | ICD-10-CM | POA: Diagnosis not present

## 2015-08-14 DIAGNOSIS — M1990 Unspecified osteoarthritis, unspecified site: Secondary | ICD-10-CM | POA: Diagnosis not present

## 2015-08-14 DIAGNOSIS — Z794 Long term (current) use of insulin: Secondary | ICD-10-CM | POA: Diagnosis not present

## 2015-08-14 DIAGNOSIS — I10 Essential (primary) hypertension: Secondary | ICD-10-CM | POA: Diagnosis not present

## 2015-08-16 DIAGNOSIS — Z7982 Long term (current) use of aspirin: Secondary | ICD-10-CM | POA: Diagnosis not present

## 2015-08-16 DIAGNOSIS — M1990 Unspecified osteoarthritis, unspecified site: Secondary | ICD-10-CM | POA: Diagnosis not present

## 2015-08-16 DIAGNOSIS — E785 Hyperlipidemia, unspecified: Secondary | ICD-10-CM | POA: Diagnosis not present

## 2015-08-16 DIAGNOSIS — Z794 Long term (current) use of insulin: Secondary | ICD-10-CM | POA: Diagnosis not present

## 2015-08-16 DIAGNOSIS — E119 Type 2 diabetes mellitus without complications: Secondary | ICD-10-CM | POA: Diagnosis not present

## 2015-08-16 DIAGNOSIS — I10 Essential (primary) hypertension: Secondary | ICD-10-CM | POA: Diagnosis not present

## 2015-08-16 DIAGNOSIS — M23631 Other spontaneous disruption of medial collateral ligament of right knee: Secondary | ICD-10-CM | POA: Diagnosis not present

## 2015-08-16 DIAGNOSIS — M2201 Recurrent dislocation of patella, right knee: Secondary | ICD-10-CM | POA: Diagnosis not present

## 2015-08-16 DIAGNOSIS — R600 Localized edema: Secondary | ICD-10-CM | POA: Diagnosis not present

## 2015-08-16 DIAGNOSIS — Z96651 Presence of right artificial knee joint: Secondary | ICD-10-CM | POA: Diagnosis not present

## 2015-08-21 DIAGNOSIS — Z96651 Presence of right artificial knee joint: Secondary | ICD-10-CM | POA: Diagnosis not present

## 2015-08-21 DIAGNOSIS — M1990 Unspecified osteoarthritis, unspecified site: Secondary | ICD-10-CM | POA: Diagnosis not present

## 2015-08-21 DIAGNOSIS — R600 Localized edema: Secondary | ICD-10-CM | POA: Diagnosis not present

## 2015-08-21 DIAGNOSIS — M23631 Other spontaneous disruption of medial collateral ligament of right knee: Secondary | ICD-10-CM | POA: Diagnosis not present

## 2015-08-21 DIAGNOSIS — E785 Hyperlipidemia, unspecified: Secondary | ICD-10-CM | POA: Diagnosis not present

## 2015-08-21 DIAGNOSIS — Z794 Long term (current) use of insulin: Secondary | ICD-10-CM | POA: Diagnosis not present

## 2015-08-21 DIAGNOSIS — E119 Type 2 diabetes mellitus without complications: Secondary | ICD-10-CM | POA: Diagnosis not present

## 2015-08-21 DIAGNOSIS — M2201 Recurrent dislocation of patella, right knee: Secondary | ICD-10-CM | POA: Diagnosis not present

## 2015-08-21 DIAGNOSIS — Z7982 Long term (current) use of aspirin: Secondary | ICD-10-CM | POA: Diagnosis not present

## 2015-08-21 DIAGNOSIS — I10 Essential (primary) hypertension: Secondary | ICD-10-CM | POA: Diagnosis not present

## 2015-08-23 DIAGNOSIS — R600 Localized edema: Secondary | ICD-10-CM | POA: Diagnosis not present

## 2015-08-23 DIAGNOSIS — Z96651 Presence of right artificial knee joint: Secondary | ICD-10-CM | POA: Diagnosis not present

## 2015-08-23 DIAGNOSIS — Z794 Long term (current) use of insulin: Secondary | ICD-10-CM | POA: Diagnosis not present

## 2015-08-23 DIAGNOSIS — E785 Hyperlipidemia, unspecified: Secondary | ICD-10-CM | POA: Diagnosis not present

## 2015-08-23 DIAGNOSIS — E119 Type 2 diabetes mellitus without complications: Secondary | ICD-10-CM | POA: Diagnosis not present

## 2015-08-23 DIAGNOSIS — M23631 Other spontaneous disruption of medial collateral ligament of right knee: Secondary | ICD-10-CM | POA: Diagnosis not present

## 2015-08-23 DIAGNOSIS — M2201 Recurrent dislocation of patella, right knee: Secondary | ICD-10-CM | POA: Diagnosis not present

## 2015-08-23 DIAGNOSIS — I10 Essential (primary) hypertension: Secondary | ICD-10-CM | POA: Diagnosis not present

## 2015-08-23 DIAGNOSIS — Z7982 Long term (current) use of aspirin: Secondary | ICD-10-CM | POA: Diagnosis not present

## 2015-08-23 DIAGNOSIS — M1990 Unspecified osteoarthritis, unspecified site: Secondary | ICD-10-CM | POA: Diagnosis not present

## 2015-08-25 DIAGNOSIS — M2201 Recurrent dislocation of patella, right knee: Secondary | ICD-10-CM | POA: Diagnosis not present

## 2015-08-25 DIAGNOSIS — R600 Localized edema: Secondary | ICD-10-CM | POA: Diagnosis not present

## 2015-08-25 DIAGNOSIS — M1990 Unspecified osteoarthritis, unspecified site: Secondary | ICD-10-CM | POA: Diagnosis not present

## 2015-08-25 DIAGNOSIS — Z794 Long term (current) use of insulin: Secondary | ICD-10-CM | POA: Diagnosis not present

## 2015-08-25 DIAGNOSIS — E119 Type 2 diabetes mellitus without complications: Secondary | ICD-10-CM | POA: Diagnosis not present

## 2015-08-25 DIAGNOSIS — Z7982 Long term (current) use of aspirin: Secondary | ICD-10-CM | POA: Diagnosis not present

## 2015-08-25 DIAGNOSIS — E785 Hyperlipidemia, unspecified: Secondary | ICD-10-CM | POA: Diagnosis not present

## 2015-08-25 DIAGNOSIS — M23631 Other spontaneous disruption of medial collateral ligament of right knee: Secondary | ICD-10-CM | POA: Diagnosis not present

## 2015-08-25 DIAGNOSIS — I10 Essential (primary) hypertension: Secondary | ICD-10-CM | POA: Diagnosis not present

## 2015-08-25 DIAGNOSIS — Z96651 Presence of right artificial knee joint: Secondary | ICD-10-CM | POA: Diagnosis not present

## 2015-08-28 DIAGNOSIS — Z7982 Long term (current) use of aspirin: Secondary | ICD-10-CM | POA: Diagnosis not present

## 2015-08-28 DIAGNOSIS — R600 Localized edema: Secondary | ICD-10-CM | POA: Diagnosis not present

## 2015-08-28 DIAGNOSIS — M1990 Unspecified osteoarthritis, unspecified site: Secondary | ICD-10-CM | POA: Diagnosis not present

## 2015-08-28 DIAGNOSIS — M23631 Other spontaneous disruption of medial collateral ligament of right knee: Secondary | ICD-10-CM | POA: Diagnosis not present

## 2015-08-28 DIAGNOSIS — I10 Essential (primary) hypertension: Secondary | ICD-10-CM | POA: Diagnosis not present

## 2015-08-28 DIAGNOSIS — Z794 Long term (current) use of insulin: Secondary | ICD-10-CM | POA: Diagnosis not present

## 2015-08-28 DIAGNOSIS — E119 Type 2 diabetes mellitus without complications: Secondary | ICD-10-CM | POA: Diagnosis not present

## 2015-08-28 DIAGNOSIS — M2201 Recurrent dislocation of patella, right knee: Secondary | ICD-10-CM | POA: Diagnosis not present

## 2015-08-28 DIAGNOSIS — Z96651 Presence of right artificial knee joint: Secondary | ICD-10-CM | POA: Diagnosis not present

## 2015-08-28 DIAGNOSIS — E785 Hyperlipidemia, unspecified: Secondary | ICD-10-CM | POA: Diagnosis not present

## 2015-08-29 DIAGNOSIS — M2201 Recurrent dislocation of patella, right knee: Secondary | ICD-10-CM | POA: Diagnosis not present

## 2015-08-29 DIAGNOSIS — Z7982 Long term (current) use of aspirin: Secondary | ICD-10-CM | POA: Diagnosis not present

## 2015-08-29 DIAGNOSIS — M23631 Other spontaneous disruption of medial collateral ligament of right knee: Secondary | ICD-10-CM | POA: Diagnosis not present

## 2015-08-29 DIAGNOSIS — E119 Type 2 diabetes mellitus without complications: Secondary | ICD-10-CM | POA: Diagnosis not present

## 2015-08-29 DIAGNOSIS — Z794 Long term (current) use of insulin: Secondary | ICD-10-CM | POA: Diagnosis not present

## 2015-08-29 DIAGNOSIS — Z96651 Presence of right artificial knee joint: Secondary | ICD-10-CM | POA: Diagnosis not present

## 2015-08-29 DIAGNOSIS — I10 Essential (primary) hypertension: Secondary | ICD-10-CM | POA: Diagnosis not present

## 2015-08-29 DIAGNOSIS — R600 Localized edema: Secondary | ICD-10-CM | POA: Diagnosis not present

## 2015-08-29 DIAGNOSIS — E785 Hyperlipidemia, unspecified: Secondary | ICD-10-CM | POA: Diagnosis not present

## 2015-08-29 DIAGNOSIS — M1990 Unspecified osteoarthritis, unspecified site: Secondary | ICD-10-CM | POA: Diagnosis not present

## 2015-08-30 DIAGNOSIS — R600 Localized edema: Secondary | ICD-10-CM | POA: Diagnosis not present

## 2015-08-30 DIAGNOSIS — Z7982 Long term (current) use of aspirin: Secondary | ICD-10-CM | POA: Diagnosis not present

## 2015-08-30 DIAGNOSIS — Z96651 Presence of right artificial knee joint: Secondary | ICD-10-CM | POA: Diagnosis not present

## 2015-08-30 DIAGNOSIS — Z794 Long term (current) use of insulin: Secondary | ICD-10-CM | POA: Diagnosis not present

## 2015-08-30 DIAGNOSIS — E785 Hyperlipidemia, unspecified: Secondary | ICD-10-CM | POA: Diagnosis not present

## 2015-08-30 DIAGNOSIS — M2201 Recurrent dislocation of patella, right knee: Secondary | ICD-10-CM | POA: Diagnosis not present

## 2015-08-30 DIAGNOSIS — E119 Type 2 diabetes mellitus without complications: Secondary | ICD-10-CM | POA: Diagnosis not present

## 2015-08-30 DIAGNOSIS — M1990 Unspecified osteoarthritis, unspecified site: Secondary | ICD-10-CM | POA: Diagnosis not present

## 2015-08-30 DIAGNOSIS — I10 Essential (primary) hypertension: Secondary | ICD-10-CM | POA: Diagnosis not present

## 2015-08-30 DIAGNOSIS — M23631 Other spontaneous disruption of medial collateral ligament of right knee: Secondary | ICD-10-CM | POA: Diagnosis not present

## 2015-08-31 DIAGNOSIS — M23631 Other spontaneous disruption of medial collateral ligament of right knee: Secondary | ICD-10-CM | POA: Diagnosis not present

## 2015-08-31 DIAGNOSIS — R600 Localized edema: Secondary | ICD-10-CM | POA: Diagnosis not present

## 2015-08-31 DIAGNOSIS — Z794 Long term (current) use of insulin: Secondary | ICD-10-CM | POA: Diagnosis not present

## 2015-08-31 DIAGNOSIS — Z7982 Long term (current) use of aspirin: Secondary | ICD-10-CM | POA: Diagnosis not present

## 2015-08-31 DIAGNOSIS — Z96651 Presence of right artificial knee joint: Secondary | ICD-10-CM | POA: Diagnosis not present

## 2015-08-31 DIAGNOSIS — E119 Type 2 diabetes mellitus without complications: Secondary | ICD-10-CM | POA: Diagnosis not present

## 2015-08-31 DIAGNOSIS — I10 Essential (primary) hypertension: Secondary | ICD-10-CM | POA: Diagnosis not present

## 2015-08-31 DIAGNOSIS — M2201 Recurrent dislocation of patella, right knee: Secondary | ICD-10-CM | POA: Diagnosis not present

## 2015-08-31 DIAGNOSIS — M1990 Unspecified osteoarthritis, unspecified site: Secondary | ICD-10-CM | POA: Diagnosis not present

## 2015-08-31 DIAGNOSIS — E785 Hyperlipidemia, unspecified: Secondary | ICD-10-CM | POA: Diagnosis not present

## 2015-09-06 DIAGNOSIS — M23631 Other spontaneous disruption of medial collateral ligament of right knee: Secondary | ICD-10-CM | POA: Diagnosis not present

## 2015-09-06 DIAGNOSIS — I10 Essential (primary) hypertension: Secondary | ICD-10-CM | POA: Diagnosis not present

## 2015-09-06 DIAGNOSIS — E119 Type 2 diabetes mellitus without complications: Secondary | ICD-10-CM | POA: Diagnosis not present

## 2015-09-06 DIAGNOSIS — E785 Hyperlipidemia, unspecified: Secondary | ICD-10-CM | POA: Diagnosis not present

## 2015-09-06 DIAGNOSIS — Z7982 Long term (current) use of aspirin: Secondary | ICD-10-CM | POA: Diagnosis not present

## 2015-09-06 DIAGNOSIS — R600 Localized edema: Secondary | ICD-10-CM | POA: Diagnosis not present

## 2015-09-06 DIAGNOSIS — Z96651 Presence of right artificial knee joint: Secondary | ICD-10-CM | POA: Diagnosis not present

## 2015-09-06 DIAGNOSIS — Z794 Long term (current) use of insulin: Secondary | ICD-10-CM | POA: Diagnosis not present

## 2015-09-06 DIAGNOSIS — M2201 Recurrent dislocation of patella, right knee: Secondary | ICD-10-CM | POA: Diagnosis not present

## 2015-09-06 DIAGNOSIS — M1990 Unspecified osteoarthritis, unspecified site: Secondary | ICD-10-CM | POA: Diagnosis not present

## 2015-09-11 DIAGNOSIS — Z96651 Presence of right artificial knee joint: Secondary | ICD-10-CM | POA: Diagnosis not present

## 2015-09-11 DIAGNOSIS — I10 Essential (primary) hypertension: Secondary | ICD-10-CM | POA: Diagnosis not present

## 2015-09-11 DIAGNOSIS — Z7982 Long term (current) use of aspirin: Secondary | ICD-10-CM | POA: Diagnosis not present

## 2015-09-11 DIAGNOSIS — E119 Type 2 diabetes mellitus without complications: Secondary | ICD-10-CM | POA: Diagnosis not present

## 2015-09-11 DIAGNOSIS — M1990 Unspecified osteoarthritis, unspecified site: Secondary | ICD-10-CM | POA: Diagnosis not present

## 2015-09-11 DIAGNOSIS — E785 Hyperlipidemia, unspecified: Secondary | ICD-10-CM | POA: Diagnosis not present

## 2015-09-11 DIAGNOSIS — Z794 Long term (current) use of insulin: Secondary | ICD-10-CM | POA: Diagnosis not present

## 2015-09-11 DIAGNOSIS — M2201 Recurrent dislocation of patella, right knee: Secondary | ICD-10-CM | POA: Diagnosis not present

## 2015-09-11 DIAGNOSIS — R600 Localized edema: Secondary | ICD-10-CM | POA: Diagnosis not present

## 2015-09-11 DIAGNOSIS — M23631 Other spontaneous disruption of medial collateral ligament of right knee: Secondary | ICD-10-CM | POA: Diagnosis not present

## 2015-09-12 DIAGNOSIS — G894 Chronic pain syndrome: Secondary | ICD-10-CM | POA: Diagnosis not present

## 2015-09-12 DIAGNOSIS — Z79899 Other long term (current) drug therapy: Secondary | ICD-10-CM | POA: Diagnosis not present

## 2015-09-12 DIAGNOSIS — M150 Primary generalized (osteo)arthritis: Secondary | ICD-10-CM | POA: Diagnosis not present

## 2015-09-12 DIAGNOSIS — M25561 Pain in right knee: Secondary | ICD-10-CM | POA: Diagnosis not present

## 2015-09-13 DIAGNOSIS — Z794 Long term (current) use of insulin: Secondary | ICD-10-CM | POA: Diagnosis not present

## 2015-09-13 DIAGNOSIS — M1990 Unspecified osteoarthritis, unspecified site: Secondary | ICD-10-CM | POA: Diagnosis not present

## 2015-09-13 DIAGNOSIS — M23631 Other spontaneous disruption of medial collateral ligament of right knee: Secondary | ICD-10-CM | POA: Diagnosis not present

## 2015-09-13 DIAGNOSIS — E785 Hyperlipidemia, unspecified: Secondary | ICD-10-CM | POA: Diagnosis not present

## 2015-09-13 DIAGNOSIS — I10 Essential (primary) hypertension: Secondary | ICD-10-CM | POA: Diagnosis not present

## 2015-09-13 DIAGNOSIS — R600 Localized edema: Secondary | ICD-10-CM | POA: Diagnosis not present

## 2015-09-13 DIAGNOSIS — Z96651 Presence of right artificial knee joint: Secondary | ICD-10-CM | POA: Diagnosis not present

## 2015-09-13 DIAGNOSIS — Z7982 Long term (current) use of aspirin: Secondary | ICD-10-CM | POA: Diagnosis not present

## 2015-09-13 DIAGNOSIS — M2201 Recurrent dislocation of patella, right knee: Secondary | ICD-10-CM | POA: Diagnosis not present

## 2015-09-13 DIAGNOSIS — E119 Type 2 diabetes mellitus without complications: Secondary | ICD-10-CM | POA: Diagnosis not present

## 2015-09-19 DIAGNOSIS — M25561 Pain in right knee: Secondary | ICD-10-CM | POA: Diagnosis not present

## 2015-09-19 DIAGNOSIS — M25562 Pain in left knee: Secondary | ICD-10-CM | POA: Diagnosis not present

## 2015-09-21 DIAGNOSIS — Z794 Long term (current) use of insulin: Secondary | ICD-10-CM | POA: Diagnosis not present

## 2015-09-21 DIAGNOSIS — R600 Localized edema: Secondary | ICD-10-CM | POA: Diagnosis not present

## 2015-09-21 DIAGNOSIS — E119 Type 2 diabetes mellitus without complications: Secondary | ICD-10-CM | POA: Diagnosis not present

## 2015-09-21 DIAGNOSIS — M1990 Unspecified osteoarthritis, unspecified site: Secondary | ICD-10-CM | POA: Diagnosis not present

## 2015-09-21 DIAGNOSIS — M2201 Recurrent dislocation of patella, right knee: Secondary | ICD-10-CM | POA: Diagnosis not present

## 2015-09-21 DIAGNOSIS — Z96651 Presence of right artificial knee joint: Secondary | ICD-10-CM | POA: Diagnosis not present

## 2015-09-21 DIAGNOSIS — M23631 Other spontaneous disruption of medial collateral ligament of right knee: Secondary | ICD-10-CM | POA: Diagnosis not present

## 2015-09-21 DIAGNOSIS — I10 Essential (primary) hypertension: Secondary | ICD-10-CM | POA: Diagnosis not present

## 2015-09-21 DIAGNOSIS — Z7982 Long term (current) use of aspirin: Secondary | ICD-10-CM | POA: Diagnosis not present

## 2015-09-21 DIAGNOSIS — E785 Hyperlipidemia, unspecified: Secondary | ICD-10-CM | POA: Diagnosis not present

## 2015-09-26 DIAGNOSIS — Z0000 Encounter for general adult medical examination without abnormal findings: Secondary | ICD-10-CM | POA: Diagnosis not present

## 2015-09-26 DIAGNOSIS — N182 Chronic kidney disease, stage 2 (mild): Secondary | ICD-10-CM | POA: Diagnosis not present

## 2015-09-26 DIAGNOSIS — E1122 Type 2 diabetes mellitus with diabetic chronic kidney disease: Secondary | ICD-10-CM | POA: Diagnosis not present

## 2015-09-26 DIAGNOSIS — R946 Abnormal results of thyroid function studies: Secondary | ICD-10-CM | POA: Diagnosis not present

## 2015-09-26 DIAGNOSIS — E559 Vitamin D deficiency, unspecified: Secondary | ICD-10-CM | POA: Diagnosis not present

## 2015-09-26 DIAGNOSIS — I130 Hypertensive heart and chronic kidney disease with heart failure and stage 1 through stage 4 chronic kidney disease, or unspecified chronic kidney disease: Secondary | ICD-10-CM | POA: Diagnosis not present

## 2015-09-26 DIAGNOSIS — I5032 Chronic diastolic (congestive) heart failure: Secondary | ICD-10-CM | POA: Diagnosis not present

## 2015-10-06 DIAGNOSIS — Z7982 Long term (current) use of aspirin: Secondary | ICD-10-CM | POA: Diagnosis not present

## 2015-10-06 DIAGNOSIS — Z794 Long term (current) use of insulin: Secondary | ICD-10-CM | POA: Diagnosis not present

## 2015-10-06 DIAGNOSIS — M23631 Other spontaneous disruption of medial collateral ligament of right knee: Secondary | ICD-10-CM | POA: Diagnosis not present

## 2015-10-06 DIAGNOSIS — M1990 Unspecified osteoarthritis, unspecified site: Secondary | ICD-10-CM | POA: Diagnosis not present

## 2015-10-06 DIAGNOSIS — I10 Essential (primary) hypertension: Secondary | ICD-10-CM | POA: Diagnosis not present

## 2015-10-06 DIAGNOSIS — R600 Localized edema: Secondary | ICD-10-CM | POA: Diagnosis not present

## 2015-10-06 DIAGNOSIS — Z96651 Presence of right artificial knee joint: Secondary | ICD-10-CM | POA: Diagnosis not present

## 2015-10-06 DIAGNOSIS — M2201 Recurrent dislocation of patella, right knee: Secondary | ICD-10-CM | POA: Diagnosis not present

## 2015-10-06 DIAGNOSIS — E119 Type 2 diabetes mellitus without complications: Secondary | ICD-10-CM | POA: Diagnosis not present

## 2015-10-06 DIAGNOSIS — E785 Hyperlipidemia, unspecified: Secondary | ICD-10-CM | POA: Diagnosis not present

## 2015-10-10 DIAGNOSIS — Z79891 Long term (current) use of opiate analgesic: Secondary | ICD-10-CM | POA: Diagnosis not present

## 2015-10-10 DIAGNOSIS — M25561 Pain in right knee: Secondary | ICD-10-CM | POA: Diagnosis not present

## 2015-10-10 DIAGNOSIS — M150 Primary generalized (osteo)arthritis: Secondary | ICD-10-CM | POA: Diagnosis not present

## 2015-10-10 DIAGNOSIS — G894 Chronic pain syndrome: Secondary | ICD-10-CM | POA: Diagnosis not present

## 2015-10-10 DIAGNOSIS — Z79899 Other long term (current) drug therapy: Secondary | ICD-10-CM | POA: Diagnosis not present

## 2015-10-12 DIAGNOSIS — M1990 Unspecified osteoarthritis, unspecified site: Secondary | ICD-10-CM | POA: Diagnosis not present

## 2015-10-12 DIAGNOSIS — M2201 Recurrent dislocation of patella, right knee: Secondary | ICD-10-CM | POA: Diagnosis not present

## 2015-10-12 DIAGNOSIS — Z96651 Presence of right artificial knee joint: Secondary | ICD-10-CM | POA: Diagnosis not present

## 2015-10-12 DIAGNOSIS — I10 Essential (primary) hypertension: Secondary | ICD-10-CM | POA: Diagnosis not present

## 2015-10-12 DIAGNOSIS — M23631 Other spontaneous disruption of medial collateral ligament of right knee: Secondary | ICD-10-CM | POA: Diagnosis not present

## 2015-10-12 DIAGNOSIS — Z794 Long term (current) use of insulin: Secondary | ICD-10-CM | POA: Diagnosis not present

## 2015-10-12 DIAGNOSIS — E119 Type 2 diabetes mellitus without complications: Secondary | ICD-10-CM | POA: Diagnosis not present

## 2015-10-12 DIAGNOSIS — Z7982 Long term (current) use of aspirin: Secondary | ICD-10-CM | POA: Diagnosis not present

## 2015-10-12 DIAGNOSIS — E785 Hyperlipidemia, unspecified: Secondary | ICD-10-CM | POA: Diagnosis not present

## 2015-10-12 DIAGNOSIS — R600 Localized edema: Secondary | ICD-10-CM | POA: Diagnosis not present

## 2015-11-07 DIAGNOSIS — M25561 Pain in right knee: Secondary | ICD-10-CM | POA: Diagnosis not present

## 2015-11-07 DIAGNOSIS — G894 Chronic pain syndrome: Secondary | ICD-10-CM | POA: Diagnosis not present

## 2015-11-07 DIAGNOSIS — M150 Primary generalized (osteo)arthritis: Secondary | ICD-10-CM | POA: Diagnosis not present

## 2015-11-07 DIAGNOSIS — Z79891 Long term (current) use of opiate analgesic: Secondary | ICD-10-CM | POA: Diagnosis not present

## 2015-12-05 DIAGNOSIS — M25561 Pain in right knee: Secondary | ICD-10-CM | POA: Diagnosis not present

## 2015-12-05 DIAGNOSIS — Z79891 Long term (current) use of opiate analgesic: Secondary | ICD-10-CM | POA: Diagnosis not present

## 2015-12-05 DIAGNOSIS — G894 Chronic pain syndrome: Secondary | ICD-10-CM | POA: Diagnosis not present

## 2015-12-05 DIAGNOSIS — M150 Primary generalized (osteo)arthritis: Secondary | ICD-10-CM | POA: Diagnosis not present

## 2016-01-02 DIAGNOSIS — M25561 Pain in right knee: Secondary | ICD-10-CM | POA: Diagnosis not present

## 2016-01-02 DIAGNOSIS — G894 Chronic pain syndrome: Secondary | ICD-10-CM | POA: Diagnosis not present

## 2016-01-02 DIAGNOSIS — M150 Primary generalized (osteo)arthritis: Secondary | ICD-10-CM | POA: Diagnosis not present

## 2016-01-02 DIAGNOSIS — Z79891 Long term (current) use of opiate analgesic: Secondary | ICD-10-CM | POA: Diagnosis not present

## 2016-01-30 DIAGNOSIS — M25561 Pain in right knee: Secondary | ICD-10-CM | POA: Diagnosis not present

## 2016-01-30 DIAGNOSIS — M150 Primary generalized (osteo)arthritis: Secondary | ICD-10-CM | POA: Diagnosis not present

## 2016-01-30 DIAGNOSIS — G894 Chronic pain syndrome: Secondary | ICD-10-CM | POA: Diagnosis not present

## 2016-01-30 DIAGNOSIS — Z79891 Long term (current) use of opiate analgesic: Secondary | ICD-10-CM | POA: Diagnosis not present

## 2016-02-12 DIAGNOSIS — N182 Chronic kidney disease, stage 2 (mild): Secondary | ICD-10-CM | POA: Diagnosis not present

## 2016-02-12 DIAGNOSIS — N08 Glomerular disorders in diseases classified elsewhere: Secondary | ICD-10-CM | POA: Diagnosis not present

## 2016-02-12 DIAGNOSIS — I1310 Hypertensive heart and chronic kidney disease without heart failure, with stage 1 through stage 4 chronic kidney disease, or unspecified chronic kidney disease: Secondary | ICD-10-CM | POA: Diagnosis not present

## 2016-02-12 DIAGNOSIS — J309 Allergic rhinitis, unspecified: Secondary | ICD-10-CM | POA: Diagnosis not present

## 2016-02-12 DIAGNOSIS — I5032 Chronic diastolic (congestive) heart failure: Secondary | ICD-10-CM | POA: Diagnosis not present

## 2016-02-12 DIAGNOSIS — E1122 Type 2 diabetes mellitus with diabetic chronic kidney disease: Secondary | ICD-10-CM | POA: Diagnosis not present

## 2016-02-23 DIAGNOSIS — I10 Essential (primary) hypertension: Secondary | ICD-10-CM | POA: Diagnosis not present

## 2016-02-23 DIAGNOSIS — E0590 Thyrotoxicosis, unspecified without thyrotoxic crisis or storm: Secondary | ICD-10-CM | POA: Diagnosis not present

## 2016-02-26 ENCOUNTER — Other Ambulatory Visit (HOSPITAL_COMMUNITY): Payer: Self-pay | Admitting: Endocrinology

## 2016-02-26 DIAGNOSIS — E0590 Thyrotoxicosis, unspecified without thyrotoxic crisis or storm: Secondary | ICD-10-CM

## 2016-02-27 DIAGNOSIS — Z79891 Long term (current) use of opiate analgesic: Secondary | ICD-10-CM | POA: Diagnosis not present

## 2016-02-27 DIAGNOSIS — M25561 Pain in right knee: Secondary | ICD-10-CM | POA: Diagnosis not present

## 2016-02-27 DIAGNOSIS — G894 Chronic pain syndrome: Secondary | ICD-10-CM | POA: Diagnosis not present

## 2016-02-27 DIAGNOSIS — M150 Primary generalized (osteo)arthritis: Secondary | ICD-10-CM | POA: Diagnosis not present

## 2016-03-04 ENCOUNTER — Encounter (HOSPITAL_COMMUNITY)
Admission: RE | Admit: 2016-03-04 | Discharge: 2016-03-04 | Disposition: A | Discharge status: Home / Self Care | Payer: Medicare Other | Source: Ambulatory Visit | Attending: Endocrinology | Admitting: Endocrinology

## 2016-03-04 DIAGNOSIS — E0590 Thyrotoxicosis, unspecified without thyrotoxic crisis or storm: Secondary | ICD-10-CM

## 2016-03-04 MED ORDER — SODIUM IODIDE I 131 CAPSULE
0.0080 | Freq: Once | INTRAVENOUS | Status: AC | PRN
  Administered 2016-03-04: 0.008 via ORAL

## 2016-03-05 ENCOUNTER — Encounter (HOSPITAL_COMMUNITY)
Admission: RE | Admit: 2016-03-05 | Discharge: 2016-03-05 | Disposition: A | Discharge status: Home / Self Care | Payer: Medicare Other | Source: Ambulatory Visit | Attending: Endocrinology | Admitting: Endocrinology

## 2016-03-05 DIAGNOSIS — E0590 Thyrotoxicosis, unspecified without thyrotoxic crisis or storm: Secondary | ICD-10-CM | POA: Diagnosis not present

## 2016-03-05 MED ORDER — SODIUM PERTECHNETATE TC 99M INJECTION
10.0000 | Freq: Once | INTRAVENOUS | Status: AC | PRN
  Administered 2016-03-05: 10 via INTRAVENOUS

## 2016-03-20 ENCOUNTER — Other Ambulatory Visit (HOSPITAL_COMMUNITY): Payer: Self-pay | Admitting: Endocrinology

## 2016-03-20 DIAGNOSIS — E0590 Thyrotoxicosis, unspecified without thyrotoxic crisis or storm: Secondary | ICD-10-CM

## 2016-04-01 ENCOUNTER — Ambulatory Visit (HOSPITAL_COMMUNITY)
Admission: RE | Admit: 2016-04-01 | Discharge: 2016-04-01 | Disposition: A | Discharge status: Home / Self Care | Payer: Medicare Other | Source: Ambulatory Visit | Attending: Endocrinology | Admitting: Endocrinology

## 2016-04-01 DIAGNOSIS — E0590 Thyrotoxicosis, unspecified without thyrotoxic crisis or storm: Secondary | ICD-10-CM | POA: Insufficient documentation

## 2016-04-01 DIAGNOSIS — G894 Chronic pain syndrome: Secondary | ICD-10-CM | POA: Diagnosis not present

## 2016-04-01 DIAGNOSIS — M150 Primary generalized (osteo)arthritis: Secondary | ICD-10-CM | POA: Diagnosis not present

## 2016-04-01 DIAGNOSIS — M25561 Pain in right knee: Secondary | ICD-10-CM | POA: Diagnosis not present

## 2016-04-01 DIAGNOSIS — Z79891 Long term (current) use of opiate analgesic: Secondary | ICD-10-CM | POA: Diagnosis not present

## 2016-04-01 MED ORDER — SODIUM IODIDE I 131 CAPSULE
29.7000 | Freq: Once | INTRAVENOUS | Status: AC | PRN
  Administered 2016-04-01: 29.7 via ORAL

## 2016-04-05 DIAGNOSIS — I10 Essential (primary) hypertension: Secondary | ICD-10-CM | POA: Diagnosis not present

## 2016-04-05 DIAGNOSIS — E119 Type 2 diabetes mellitus without complications: Secondary | ICD-10-CM | POA: Diagnosis not present

## 2016-04-05 DIAGNOSIS — E0590 Thyrotoxicosis, unspecified without thyrotoxic crisis or storm: Secondary | ICD-10-CM | POA: Diagnosis not present

## 2016-04-29 DIAGNOSIS — M150 Primary generalized (osteo)arthritis: Secondary | ICD-10-CM | POA: Diagnosis not present

## 2016-04-29 DIAGNOSIS — M25561 Pain in right knee: Secondary | ICD-10-CM | POA: Diagnosis not present

## 2016-04-29 DIAGNOSIS — Z79891 Long term (current) use of opiate analgesic: Secondary | ICD-10-CM | POA: Diagnosis not present

## 2016-04-29 DIAGNOSIS — G894 Chronic pain syndrome: Secondary | ICD-10-CM | POA: Diagnosis not present

## 2016-05-13 DIAGNOSIS — I130 Hypertensive heart and chronic kidney disease with heart failure and stage 1 through stage 4 chronic kidney disease, or unspecified chronic kidney disease: Secondary | ICD-10-CM | POA: Diagnosis not present

## 2016-05-13 DIAGNOSIS — N08 Glomerular disorders in diseases classified elsewhere: Secondary | ICD-10-CM | POA: Diagnosis not present

## 2016-05-13 DIAGNOSIS — E1122 Type 2 diabetes mellitus with diabetic chronic kidney disease: Secondary | ICD-10-CM | POA: Diagnosis not present

## 2016-05-13 DIAGNOSIS — N182 Chronic kidney disease, stage 2 (mild): Secondary | ICD-10-CM | POA: Diagnosis not present

## 2016-05-27 DIAGNOSIS — Z79891 Long term (current) use of opiate analgesic: Secondary | ICD-10-CM | POA: Diagnosis not present

## 2016-05-27 DIAGNOSIS — M25561 Pain in right knee: Secondary | ICD-10-CM | POA: Diagnosis not present

## 2016-05-27 DIAGNOSIS — G894 Chronic pain syndrome: Secondary | ICD-10-CM | POA: Diagnosis not present

## 2016-05-27 DIAGNOSIS — M150 Primary generalized (osteo)arthritis: Secondary | ICD-10-CM | POA: Diagnosis not present

## 2016-05-28 DIAGNOSIS — E0590 Thyrotoxicosis, unspecified without thyrotoxic crisis or storm: Secondary | ICD-10-CM | POA: Diagnosis not present

## 2016-06-04 DIAGNOSIS — E119 Type 2 diabetes mellitus without complications: Secondary | ICD-10-CM | POA: Diagnosis not present

## 2016-06-04 DIAGNOSIS — I10 Essential (primary) hypertension: Secondary | ICD-10-CM | POA: Diagnosis not present

## 2016-06-04 DIAGNOSIS — E0590 Thyrotoxicosis, unspecified without thyrotoxic crisis or storm: Secondary | ICD-10-CM | POA: Diagnosis not present

## 2016-06-24 DIAGNOSIS — M25561 Pain in right knee: Secondary | ICD-10-CM | POA: Diagnosis not present

## 2016-06-24 DIAGNOSIS — G894 Chronic pain syndrome: Secondary | ICD-10-CM | POA: Diagnosis not present

## 2016-06-24 DIAGNOSIS — Z79891 Long term (current) use of opiate analgesic: Secondary | ICD-10-CM | POA: Diagnosis not present

## 2016-06-24 DIAGNOSIS — M150 Primary generalized (osteo)arthritis: Secondary | ICD-10-CM | POA: Diagnosis not present

## 2016-07-22 DIAGNOSIS — Z79891 Long term (current) use of opiate analgesic: Secondary | ICD-10-CM | POA: Diagnosis not present

## 2016-07-22 DIAGNOSIS — M150 Primary generalized (osteo)arthritis: Secondary | ICD-10-CM | POA: Diagnosis not present

## 2016-07-22 DIAGNOSIS — M25561 Pain in right knee: Secondary | ICD-10-CM | POA: Diagnosis not present

## 2016-07-22 DIAGNOSIS — G894 Chronic pain syndrome: Secondary | ICD-10-CM | POA: Diagnosis not present

## 2016-08-06 DIAGNOSIS — E0590 Thyrotoxicosis, unspecified without thyrotoxic crisis or storm: Secondary | ICD-10-CM | POA: Diagnosis not present

## 2016-08-19 DIAGNOSIS — G894 Chronic pain syndrome: Secondary | ICD-10-CM | POA: Diagnosis not present

## 2016-08-19 DIAGNOSIS — M25561 Pain in right knee: Secondary | ICD-10-CM | POA: Diagnosis not present

## 2016-08-19 DIAGNOSIS — M150 Primary generalized (osteo)arthritis: Secondary | ICD-10-CM | POA: Diagnosis not present

## 2016-08-19 DIAGNOSIS — Z79891 Long term (current) use of opiate analgesic: Secondary | ICD-10-CM | POA: Diagnosis not present

## 2016-09-09 DIAGNOSIS — N182 Chronic kidney disease, stage 2 (mild): Secondary | ICD-10-CM | POA: Diagnosis not present

## 2016-09-09 DIAGNOSIS — Z1389 Encounter for screening for other disorder: Secondary | ICD-10-CM | POA: Diagnosis not present

## 2016-09-09 DIAGNOSIS — I130 Hypertensive heart and chronic kidney disease with heart failure and stage 1 through stage 4 chronic kidney disease, or unspecified chronic kidney disease: Secondary | ICD-10-CM | POA: Diagnosis not present

## 2016-09-09 DIAGNOSIS — E1122 Type 2 diabetes mellitus with diabetic chronic kidney disease: Secondary | ICD-10-CM | POA: Diagnosis not present

## 2016-09-09 DIAGNOSIS — N08 Glomerular disorders in diseases classified elsewhere: Secondary | ICD-10-CM | POA: Diagnosis not present

## 2016-09-17 DIAGNOSIS — G894 Chronic pain syndrome: Secondary | ICD-10-CM | POA: Diagnosis not present

## 2016-09-17 DIAGNOSIS — M150 Primary generalized (osteo)arthritis: Secondary | ICD-10-CM | POA: Diagnosis not present

## 2016-09-17 DIAGNOSIS — Z79891 Long term (current) use of opiate analgesic: Secondary | ICD-10-CM | POA: Diagnosis not present

## 2016-09-17 DIAGNOSIS — E0590 Thyrotoxicosis, unspecified without thyrotoxic crisis or storm: Secondary | ICD-10-CM | POA: Diagnosis not present

## 2016-09-17 DIAGNOSIS — M25561 Pain in right knee: Secondary | ICD-10-CM | POA: Diagnosis not present

## 2016-10-07 DIAGNOSIS — E890 Postprocedural hypothyroidism: Secondary | ICD-10-CM | POA: Diagnosis not present

## 2016-10-07 DIAGNOSIS — I10 Essential (primary) hypertension: Secondary | ICD-10-CM | POA: Diagnosis not present

## 2016-10-07 DIAGNOSIS — Z23 Encounter for immunization: Secondary | ICD-10-CM | POA: Diagnosis not present

## 2016-10-14 DIAGNOSIS — M25561 Pain in right knee: Secondary | ICD-10-CM | POA: Diagnosis not present

## 2016-10-14 DIAGNOSIS — Z79891 Long term (current) use of opiate analgesic: Secondary | ICD-10-CM | POA: Diagnosis not present

## 2016-10-14 DIAGNOSIS — M150 Primary generalized (osteo)arthritis: Secondary | ICD-10-CM | POA: Diagnosis not present

## 2016-10-14 DIAGNOSIS — G894 Chronic pain syndrome: Secondary | ICD-10-CM | POA: Diagnosis not present

## 2016-10-15 DIAGNOSIS — Z23 Encounter for immunization: Secondary | ICD-10-CM | POA: Diagnosis not present

## 2016-10-15 DIAGNOSIS — N08 Glomerular disorders in diseases classified elsewhere: Secondary | ICD-10-CM | POA: Diagnosis not present

## 2016-10-15 DIAGNOSIS — E1122 Type 2 diabetes mellitus with diabetic chronic kidney disease: Secondary | ICD-10-CM | POA: Diagnosis not present

## 2016-10-15 DIAGNOSIS — Z0000 Encounter for general adult medical examination without abnormal findings: Secondary | ICD-10-CM | POA: Diagnosis not present

## 2016-10-15 DIAGNOSIS — I129 Hypertensive chronic kidney disease with stage 1 through stage 4 chronic kidney disease, or unspecified chronic kidney disease: Secondary | ICD-10-CM | POA: Diagnosis not present

## 2016-10-15 DIAGNOSIS — N182 Chronic kidney disease, stage 2 (mild): Secondary | ICD-10-CM | POA: Diagnosis not present

## 2016-11-11 DIAGNOSIS — Z79891 Long term (current) use of opiate analgesic: Secondary | ICD-10-CM | POA: Diagnosis not present

## 2016-11-11 DIAGNOSIS — G894 Chronic pain syndrome: Secondary | ICD-10-CM | POA: Diagnosis not present

## 2016-11-11 DIAGNOSIS — M150 Primary generalized (osteo)arthritis: Secondary | ICD-10-CM | POA: Diagnosis not present

## 2016-11-11 DIAGNOSIS — M25561 Pain in right knee: Secondary | ICD-10-CM | POA: Diagnosis not present

## 2016-12-10 DIAGNOSIS — Z79891 Long term (current) use of opiate analgesic: Secondary | ICD-10-CM | POA: Diagnosis not present

## 2016-12-10 DIAGNOSIS — G894 Chronic pain syndrome: Secondary | ICD-10-CM | POA: Diagnosis not present

## 2016-12-10 DIAGNOSIS — M25561 Pain in right knee: Secondary | ICD-10-CM | POA: Diagnosis not present

## 2016-12-10 DIAGNOSIS — E890 Postprocedural hypothyroidism: Secondary | ICD-10-CM | POA: Diagnosis not present

## 2016-12-10 DIAGNOSIS — M150 Primary generalized (osteo)arthritis: Secondary | ICD-10-CM | POA: Diagnosis not present

## 2016-12-29 ENCOUNTER — Other Ambulatory Visit: Payer: Self-pay

## 2016-12-29 ENCOUNTER — Encounter (HOSPITAL_COMMUNITY): Payer: Self-pay | Admitting: *Deleted

## 2016-12-29 ENCOUNTER — Emergency Department (HOSPITAL_COMMUNITY)
Admission: EM | Admit: 2016-12-29 | Discharge: 2016-12-29 | Disposition: A | Discharge status: Home / Self Care | Payer: Medicare Other | Attending: Emergency Medicine | Admitting: Emergency Medicine

## 2016-12-29 DIAGNOSIS — I10 Essential (primary) hypertension: Secondary | ICD-10-CM | POA: Diagnosis not present

## 2016-12-29 DIAGNOSIS — E119 Type 2 diabetes mellitus without complications: Secondary | ICD-10-CM | POA: Insufficient documentation

## 2016-12-29 DIAGNOSIS — L03115 Cellulitis of right lower limb: Secondary | ICD-10-CM | POA: Diagnosis not present

## 2016-12-29 DIAGNOSIS — M79671 Pain in right foot: Secondary | ICD-10-CM | POA: Diagnosis present

## 2016-12-29 DIAGNOSIS — Z96651 Presence of right artificial knee joint: Secondary | ICD-10-CM | POA: Diagnosis not present

## 2016-12-29 DIAGNOSIS — Z87891 Personal history of nicotine dependence: Secondary | ICD-10-CM | POA: Diagnosis not present

## 2016-12-29 DIAGNOSIS — Z794 Long term (current) use of insulin: Secondary | ICD-10-CM | POA: Diagnosis not present

## 2016-12-29 DIAGNOSIS — Z79899 Other long term (current) drug therapy: Secondary | ICD-10-CM | POA: Insufficient documentation

## 2016-12-29 MED ORDER — CEPHALEXIN 500 MG PO CAPS: 500.0000 mg | ORAL_CAPSULE | Freq: Four times a day (QID) | ORAL | 0 refills | Status: DC

## 2016-12-29 MED ORDER — CEPHALEXIN 250 MG PO CAPS
1000.0000 mg | ORAL_CAPSULE | Freq: Once | ORAL | Status: AC
  Administered 2016-12-29: 1000 mg via ORAL
  Filled 2016-12-29: qty 4

## 2016-12-29 NOTE — ED Provider Notes (Signed)
Emergency Department Provider Note   I have reviewed the triage vital signs and the nursing notes.   HISTORY  Chief Complaint Foot Pain   HPI Mary [name] is a 71 y.o. female her diabetes, hyperlipidemia, hypertension who presents to the emergency department today with redness and swelling to her right lower extremity.  Patient states that she had foot and she will try to cut it off but then changed her mind and this was about a week ago.  Since that time she is developed a bulla and significant redness of her whole foot pain and swelling as well.  She has had an infection before not thought that was what was going on so she came here for further evaluation.  She is asymptomatic otherwise.  She has no systemic symptoms.  No fevers, nausea, vomiting, weakness.  She does not state that she denies any streaking up her leg.  No other associated or modifying symptoms.  Has tried some Listerine Epsom salts soaks without success.  Does not have a podiatrist.  Past Medical History:  Diagnosis Date  . Arthritis    osteoarthritis  . Diabetes mellitus    Insulin dependent  . H/O cardiovascular stress test    pt. reports 10/13- was normal per pt.   . Hyperlipemia   . Hypertension     Patient Active Problem List   Diagnosis Date Noted  . Knee pain 07/16/2015    Past Surgical History:  Procedure Laterality Date  . CARPAL TUNNEL RELEASE Bilateral   . KNEE ARTHROSCOPY     right  . QUADRICEPS TENDON REPAIR  02/17/2012   Procedure: REPAIR QUADRICEP TENDON;  Surgeon: Dannielle HuhSteve Lucey, MD;  Location: [facility] OR;  Service: Orthopedics;  Laterality: Right;  right quadriceps repair with latera release  . QUADRICEPS TENDON REPAIR Right 05/15/2012   Procedure: REPAIR QUADRICEP TENDON;  Surgeon: Dannielle HuhSteve Lucey, MD;  Location: [facility] OR;  Service: Orthopedics;  Laterality: Right;  . TOTAL KNEE ARTHROPLASTY  10/21/2011   rt tk  . TOTAL KNEE ARTHROPLASTY  10/21/2011   Procedure: TOTAL KNEE ARTHROPLASTY;  Surgeon:  Raymon MuttonStephen D Lucey, MD;  Location: [facility] OR;  Service: Orthopedics;  Laterality: Right;  . TUBAL LIGATION      Current Outpatient Rx  . Order #: [id number] Class: Historical Med  . Order #: [id number] Class: Historical Med  . Order #: [id number] Class: Historical Med  . Order #: [id number] Class: Print  . Order #: [id number] Class: Historical Med  . Order #: [id number] Class: Historical Med  . Order #: [id number] Class: Historical Med  . Order #: [id number] Class: Historical Med  . Order #: [id number] Class: Print  . Order #: [id number] Class: Historical Med  . Order #: [id number] Class: Print    Allergies Patient has no known allergies.  History reviewed. No pertinent family history.  Social History Social History   Tobacco Use  . Smoking status: Former Smoker    Packs/day: 0.25    Years: 20.00    Pack years: 5.00    Types: Cigarettes    Last attempt to quit: 06/28/2014    Years since quitting: 2.5  . Smokeless tobacco: Never Used  Substance Use Topics  . Alcohol use: No  . Drug use: No    Review of Systems  All other systems negative except as documented in the HPI. All pertinent positives and negatives as reviewed in the HPI. ____________________________________________   PHYSICAL EXAM:  VITAL SIGNS: ED Triage Vitals  Enc Vitals Group     BP 12/29/16 1418 (!) 152/59  Pulse Rate 12/29/16 1418 80     Resp 12/29/16 1418 18     Temp 12/29/16 1418 98.4 F (36.9 C)     Temp Source 12/29/16 1418 Oral     SpO2 12/29/16 1418 97 %     Weight --      Height --      Head Circumference --      Peak Flow --      Pain Score 12/29/16 1417 8     Pain Loc --      Pain Edu? --      Excl. in GC? --     Constitutional: Alert and oriented. Well appearing and in no acute distress. Eyes: Conjunctivae are normal. PERRL. EOMI. Head: Atraumatic. Nose: No congestion/rhinnorhea. Mouth/Throat: Mucous membranes are moist.  Oropharynx non-erythematous. Neck: No stridor.  No meningeal signs.     Cardiovascular: Normal rate, regular rhythm. Good peripheral circulation. Grossly normal heart sounds.   Respiratory: Normal respiratory effort.  No retractions. Lungs CTAB. Gastrointestinal: Soft and nontender. No distention.  Musculoskeletal: No lower extremity tenderness nor edema. No gross deformities of extremities. Neurologic:  Normal speech and language. No gross focal neurologic deficits are appreciated.  Skin: She has erythema to the whole dorsum of her right foot but does have any pain with range of motion of any of her joints.  She has a 3 x 3 cm fluid-filled bulla on the medial aspect of the dorsum of her right foot.    _____________________________   INITIAL IMPRESSION / ASSESSMENT AND PLAN / ED COURSE  Patient has good feeling to the bottom of her foot and has normal vital signs without any systemic symptoms so we will start with oral antibiotics.  We will ask her to follow-up with her primary doctor and podiatry.  She is also instructed if she has not get better in 2 or 3 days to please come back here for further evaluation.  No evidence of osteomyelitis or septic joint at this time.     Pertinent labs & imaging results that were available during my care of the patient were reviewed by me and considered in my medical decision making (see chart for details).  ____________________________________________  FINAL CLINICAL IMPRESSION(S) / ED DIAGNOSES  Final diagnoses:  Cellulitis of right lower extremity     MEDICATIONS GIVEN DURING THIS VISIT:  Medications  cephALEXin (KEFLEX) capsule 1,000 mg (not administered)     NEW OUTPATIENT MEDICATIONS STARTED DURING THIS VISIT:  This SmartLink is deprecated. Use AVSMEDLIST instead to display the medication list for a patient.  Note:  This note was prepared with assistance of Dragon voice recognition software. Occasional wrong-word or sound-a-like substitutions may have occurred due to the inherent limitations of voice  recognition software.   Marily MemosMesner, Neesha Langton, MD 12/29/16 [phone]

## 2016-12-29 NOTE — ED Triage Notes (Signed)
Pt reports being diabetic and cutting her toenails last week. Now has swelling, pain and drainage from right foot.

## 2017-01-06 DIAGNOSIS — L84 Corns and callosities: Secondary | ICD-10-CM | POA: Diagnosis not present

## 2017-01-06 DIAGNOSIS — T8130XS Disruption of wound, unspecified, sequela: Secondary | ICD-10-CM | POA: Diagnosis not present

## 2017-01-06 DIAGNOSIS — R600 Localized edema: Secondary | ICD-10-CM | POA: Diagnosis not present

## 2017-01-06 DIAGNOSIS — Z09 Encounter for follow-up examination after completed treatment for conditions other than malignant neoplasm: Secondary | ICD-10-CM | POA: Diagnosis not present

## 2017-01-08 DIAGNOSIS — E7800 Pure hypercholesterolemia, unspecified: Secondary | ICD-10-CM | POA: Diagnosis not present

## 2017-01-08 DIAGNOSIS — Z87891 Personal history of nicotine dependence: Secondary | ICD-10-CM | POA: Diagnosis not present

## 2017-01-08 DIAGNOSIS — M1990 Unspecified osteoarthritis, unspecified site: Secondary | ICD-10-CM | POA: Diagnosis not present

## 2017-01-08 DIAGNOSIS — L84 Corns and callosities: Secondary | ICD-10-CM | POA: Diagnosis not present

## 2017-01-08 DIAGNOSIS — Z9181 History of falling: Secondary | ICD-10-CM | POA: Diagnosis not present

## 2017-01-08 DIAGNOSIS — I1310 Hypertensive heart and chronic kidney disease without heart failure, with stage 1 through stage 4 chronic kidney disease, or unspecified chronic kidney disease: Secondary | ICD-10-CM | POA: Diagnosis not present

## 2017-01-08 DIAGNOSIS — N182 Chronic kidney disease, stage 2 (mild): Secondary | ICD-10-CM | POA: Diagnosis not present

## 2017-01-08 DIAGNOSIS — E1122 Type 2 diabetes mellitus with diabetic chronic kidney disease: Secondary | ICD-10-CM | POA: Diagnosis not present

## 2017-01-09 DIAGNOSIS — E1122 Type 2 diabetes mellitus with diabetic chronic kidney disease: Secondary | ICD-10-CM | POA: Diagnosis not present

## 2017-01-09 DIAGNOSIS — M1990 Unspecified osteoarthritis, unspecified site: Secondary | ICD-10-CM | POA: Diagnosis not present

## 2017-01-09 DIAGNOSIS — Z87891 Personal history of nicotine dependence: Secondary | ICD-10-CM | POA: Diagnosis not present

## 2017-01-09 DIAGNOSIS — Z9181 History of falling: Secondary | ICD-10-CM | POA: Diagnosis not present

## 2017-01-09 DIAGNOSIS — I1310 Hypertensive heart and chronic kidney disease without heart failure, with stage 1 through stage 4 chronic kidney disease, or unspecified chronic kidney disease: Secondary | ICD-10-CM | POA: Diagnosis not present

## 2017-01-09 DIAGNOSIS — L84 Corns and callosities: Secondary | ICD-10-CM | POA: Diagnosis not present

## 2017-01-09 DIAGNOSIS — N182 Chronic kidney disease, stage 2 (mild): Secondary | ICD-10-CM | POA: Diagnosis not present

## 2017-01-09 DIAGNOSIS — E7800 Pure hypercholesterolemia, unspecified: Secondary | ICD-10-CM | POA: Diagnosis not present

## 2017-01-10 DIAGNOSIS — I1310 Hypertensive heart and chronic kidney disease without heart failure, with stage 1 through stage 4 chronic kidney disease, or unspecified chronic kidney disease: Secondary | ICD-10-CM | POA: Diagnosis not present

## 2017-01-10 DIAGNOSIS — Z79891 Long term (current) use of opiate analgesic: Secondary | ICD-10-CM | POA: Diagnosis not present

## 2017-01-10 DIAGNOSIS — E7800 Pure hypercholesterolemia, unspecified: Secondary | ICD-10-CM | POA: Diagnosis not present

## 2017-01-10 DIAGNOSIS — E1122 Type 2 diabetes mellitus with diabetic chronic kidney disease: Secondary | ICD-10-CM | POA: Diagnosis not present

## 2017-01-10 DIAGNOSIS — N182 Chronic kidney disease, stage 2 (mild): Secondary | ICD-10-CM | POA: Diagnosis not present

## 2017-01-10 DIAGNOSIS — M1990 Unspecified osteoarthritis, unspecified site: Secondary | ICD-10-CM | POA: Diagnosis not present

## 2017-01-10 DIAGNOSIS — L84 Corns and callosities: Secondary | ICD-10-CM | POA: Diagnosis not present

## 2017-01-10 DIAGNOSIS — M25561 Pain in right knee: Secondary | ICD-10-CM | POA: Diagnosis not present

## 2017-01-10 DIAGNOSIS — M150 Primary generalized (osteo)arthritis: Secondary | ICD-10-CM | POA: Diagnosis not present

## 2017-01-10 DIAGNOSIS — Z9181 History of falling: Secondary | ICD-10-CM | POA: Diagnosis not present

## 2017-01-10 DIAGNOSIS — Z87891 Personal history of nicotine dependence: Secondary | ICD-10-CM | POA: Diagnosis not present

## 2017-01-10 DIAGNOSIS — G894 Chronic pain syndrome: Secondary | ICD-10-CM | POA: Diagnosis not present

## 2017-01-12 DIAGNOSIS — T8130XS Disruption of wound, unspecified, sequela: Secondary | ICD-10-CM | POA: Diagnosis not present

## 2017-01-14 DIAGNOSIS — E1122 Type 2 diabetes mellitus with diabetic chronic kidney disease: Secondary | ICD-10-CM | POA: Diagnosis not present

## 2017-01-14 DIAGNOSIS — M1990 Unspecified osteoarthritis, unspecified site: Secondary | ICD-10-CM | POA: Diagnosis not present

## 2017-01-14 DIAGNOSIS — N182 Chronic kidney disease, stage 2 (mild): Secondary | ICD-10-CM | POA: Diagnosis not present

## 2017-01-14 DIAGNOSIS — I1310 Hypertensive heart and chronic kidney disease without heart failure, with stage 1 through stage 4 chronic kidney disease, or unspecified chronic kidney disease: Secondary | ICD-10-CM | POA: Diagnosis not present

## 2017-01-14 DIAGNOSIS — Z9181 History of falling: Secondary | ICD-10-CM | POA: Diagnosis not present

## 2017-01-14 DIAGNOSIS — E7800 Pure hypercholesterolemia, unspecified: Secondary | ICD-10-CM | POA: Diagnosis not present

## 2017-01-14 DIAGNOSIS — L84 Corns and callosities: Secondary | ICD-10-CM | POA: Diagnosis not present

## 2017-01-14 DIAGNOSIS — Z87891 Personal history of nicotine dependence: Secondary | ICD-10-CM | POA: Diagnosis not present

## 2017-01-15 DIAGNOSIS — I1310 Hypertensive heart and chronic kidney disease without heart failure, with stage 1 through stage 4 chronic kidney disease, or unspecified chronic kidney disease: Secondary | ICD-10-CM | POA: Diagnosis not present

## 2017-01-15 DIAGNOSIS — I130 Hypertensive heart and chronic kidney disease with heart failure and stage 1 through stage 4 chronic kidney disease, or unspecified chronic kidney disease: Secondary | ICD-10-CM | POA: Diagnosis not present

## 2017-01-15 DIAGNOSIS — T8130XS Disruption of wound, unspecified, sequela: Secondary | ICD-10-CM | POA: Diagnosis not present

## 2017-01-15 DIAGNOSIS — N08 Glomerular disorders in diseases classified elsewhere: Secondary | ICD-10-CM | POA: Diagnosis not present

## 2017-01-15 DIAGNOSIS — E1122 Type 2 diabetes mellitus with diabetic chronic kidney disease: Secondary | ICD-10-CM | POA: Diagnosis not present

## 2017-01-15 DIAGNOSIS — E1121 Type 2 diabetes mellitus with diabetic nephropathy: Secondary | ICD-10-CM | POA: Diagnosis not present

## 2017-01-15 DIAGNOSIS — E559 Vitamin D deficiency, unspecified: Secondary | ICD-10-CM | POA: Diagnosis not present

## 2017-01-16 DIAGNOSIS — Z9181 History of falling: Secondary | ICD-10-CM | POA: Diagnosis not present

## 2017-01-16 DIAGNOSIS — L84 Corns and callosities: Secondary | ICD-10-CM | POA: Diagnosis not present

## 2017-01-16 DIAGNOSIS — N182 Chronic kidney disease, stage 2 (mild): Secondary | ICD-10-CM | POA: Diagnosis not present

## 2017-01-16 DIAGNOSIS — Z87891 Personal history of nicotine dependence: Secondary | ICD-10-CM | POA: Diagnosis not present

## 2017-01-16 DIAGNOSIS — E1122 Type 2 diabetes mellitus with diabetic chronic kidney disease: Secondary | ICD-10-CM | POA: Diagnosis not present

## 2017-01-16 DIAGNOSIS — I1310 Hypertensive heart and chronic kidney disease without heart failure, with stage 1 through stage 4 chronic kidney disease, or unspecified chronic kidney disease: Secondary | ICD-10-CM | POA: Diagnosis not present

## 2017-01-16 DIAGNOSIS — E7800 Pure hypercholesterolemia, unspecified: Secondary | ICD-10-CM | POA: Diagnosis not present

## 2017-01-16 DIAGNOSIS — M1990 Unspecified osteoarthritis, unspecified site: Secondary | ICD-10-CM | POA: Diagnosis not present

## 2017-01-20 DIAGNOSIS — M1990 Unspecified osteoarthritis, unspecified site: Secondary | ICD-10-CM | POA: Diagnosis not present

## 2017-01-20 DIAGNOSIS — N182 Chronic kidney disease, stage 2 (mild): Secondary | ICD-10-CM | POA: Diagnosis not present

## 2017-01-20 DIAGNOSIS — E7800 Pure hypercholesterolemia, unspecified: Secondary | ICD-10-CM | POA: Diagnosis not present

## 2017-01-20 DIAGNOSIS — L84 Corns and callosities: Secondary | ICD-10-CM | POA: Diagnosis not present

## 2017-01-20 DIAGNOSIS — E1122 Type 2 diabetes mellitus with diabetic chronic kidney disease: Secondary | ICD-10-CM | POA: Diagnosis not present

## 2017-01-20 DIAGNOSIS — I1310 Hypertensive heart and chronic kidney disease without heart failure, with stage 1 through stage 4 chronic kidney disease, or unspecified chronic kidney disease: Secondary | ICD-10-CM | POA: Diagnosis not present

## 2017-01-20 DIAGNOSIS — Z9181 History of falling: Secondary | ICD-10-CM | POA: Diagnosis not present

## 2017-01-20 DIAGNOSIS — Z87891 Personal history of nicotine dependence: Secondary | ICD-10-CM | POA: Diagnosis not present

## 2017-01-22 DIAGNOSIS — L84 Corns and callosities: Secondary | ICD-10-CM | POA: Diagnosis not present

## 2017-01-22 DIAGNOSIS — Z87891 Personal history of nicotine dependence: Secondary | ICD-10-CM | POA: Diagnosis not present

## 2017-01-22 DIAGNOSIS — I1310 Hypertensive heart and chronic kidney disease without heart failure, with stage 1 through stage 4 chronic kidney disease, or unspecified chronic kidney disease: Secondary | ICD-10-CM | POA: Diagnosis not present

## 2017-01-22 DIAGNOSIS — E1122 Type 2 diabetes mellitus with diabetic chronic kidney disease: Secondary | ICD-10-CM | POA: Diagnosis not present

## 2017-01-22 DIAGNOSIS — E7800 Pure hypercholesterolemia, unspecified: Secondary | ICD-10-CM | POA: Diagnosis not present

## 2017-01-22 DIAGNOSIS — N182 Chronic kidney disease, stage 2 (mild): Secondary | ICD-10-CM | POA: Diagnosis not present

## 2017-01-22 DIAGNOSIS — Z9181 History of falling: Secondary | ICD-10-CM | POA: Diagnosis not present

## 2017-01-22 DIAGNOSIS — M1990 Unspecified osteoarthritis, unspecified site: Secondary | ICD-10-CM | POA: Diagnosis not present

## 2017-01-23 DIAGNOSIS — E7800 Pure hypercholesterolemia, unspecified: Secondary | ICD-10-CM | POA: Diagnosis not present

## 2017-01-23 DIAGNOSIS — Z87891 Personal history of nicotine dependence: Secondary | ICD-10-CM | POA: Diagnosis not present

## 2017-01-23 DIAGNOSIS — E1122 Type 2 diabetes mellitus with diabetic chronic kidney disease: Secondary | ICD-10-CM | POA: Diagnosis not present

## 2017-01-23 DIAGNOSIS — Z9181 History of falling: Secondary | ICD-10-CM | POA: Diagnosis not present

## 2017-01-23 DIAGNOSIS — N182 Chronic kidney disease, stage 2 (mild): Secondary | ICD-10-CM | POA: Diagnosis not present

## 2017-01-23 DIAGNOSIS — I1310 Hypertensive heart and chronic kidney disease without heart failure, with stage 1 through stage 4 chronic kidney disease, or unspecified chronic kidney disease: Secondary | ICD-10-CM | POA: Diagnosis not present

## 2017-01-23 DIAGNOSIS — L84 Corns and callosities: Secondary | ICD-10-CM | POA: Diagnosis not present

## 2017-01-23 DIAGNOSIS — M1990 Unspecified osteoarthritis, unspecified site: Secondary | ICD-10-CM | POA: Diagnosis not present

## 2017-01-24 ENCOUNTER — Ambulatory Visit (INDEPENDENT_AMBULATORY_CARE_PROVIDER_SITE_OTHER): Payer: Medicare Other | Admitting: Podiatry

## 2017-01-24 ENCOUNTER — Encounter: Payer: Self-pay | Admitting: Podiatry

## 2017-01-24 ENCOUNTER — Ambulatory Visit (INDEPENDENT_AMBULATORY_CARE_PROVIDER_SITE_OTHER): Payer: Medicare Other

## 2017-01-24 VITALS — BP 146/96 | HR 78 | Resp 16

## 2017-01-24 DIAGNOSIS — L97512 Non-pressure chronic ulcer of other part of right foot with fat layer exposed: Secondary | ICD-10-CM

## 2017-01-24 NOTE — Progress Notes (Signed)
Subjective:  Patient ID: Mary [name], female    DOB: 04/29/1945,  MRN: [id number]  Chief Complaint  Patient presents with  . Foot Ulcer    1st MPJ right and dorsal forefoot right - pt states she tried to trim a callus herself and it got infected and blistered few weeks ago, PC rx'd antibiotics which she has completed, nurse comes to her home to do dressing changes, better today  . Callouses    Sub 1st MPJ bilateral    72 y.o. female presents with the above complaint.  Reports an ulcer to the right foot. Past Medical History:  Diagnosis Date  . Arthritis    osteoarthritis  . Diabetes mellitus    Insulin dependent  . H/O cardiovascular stress test    pt. reports 10/13- was normal per pt.   . Hyperlipemia   . Hypertension    Past Surgical History:  Procedure Laterality Date  . CARPAL TUNNEL RELEASE Bilateral   . KNEE ARTHROSCOPY     right  . QUADRICEPS TENDON REPAIR  02/17/2012   Procedure: REPAIR QUADRICEP TENDON;  Surgeon: Dannielle HuhSteve Lucey, MD;  Location: [facility];  Service: Orthopedics;  Laterality: Right;  right quadriceps repair with latera release  . QUADRICEPS TENDON REPAIR Right 05/15/2012   Procedure: REPAIR QUADRICEP TENDON;  Surgeon: Dannielle HuhSteve Lucey, MD;  Location: [facility] OR;  Service: Orthopedics;  Laterality: Right;  . TOTAL KNEE ARTHROPLASTY  10/21/2011   rt tk  . TOTAL KNEE ARTHROPLASTY  10/21/2011   Procedure: TOTAL KNEE ARTHROPLASTY;  Surgeon: Raymon MuttonStephen D Lucey, MD;  Location: [facility] OR;  Service: Orthopedics;  Laterality: Right;  . TUBAL LIGATION      Current Outpatient Medications:  .  aspirin EC 81 MG tablet, Take 81 mg by mouth daily., Disp: , Rfl:  .  cetirizine (ZYRTEC) 10 MG tablet, Take 10 mg by mouth daily., Disp: , Rfl:  .  Dulaglutide (TRULICITY) 0.75 MG/0.5ML SOPN, Inject 0.75 mg into the skin every 7 (seven) days. TUESDAY, Disp: , Rfl:  .  fluticasone (FLONASE) 50 MCG/ACT nasal spray, Place 2 sprays into the nose daily. (Patient taking differently: Place 2 sprays into  the nose daily as needed for allergies. ), Disp: 16 g, Rfl: 0 .  furosemide (LASIX) 40 MG tablet, Take 40 mg by mouth daily., Disp: , Rfl:  .  KLOR-CON M20 20 MEQ tablet, Take 20 mEq by mouth daily. with food, Disp: , Rfl: 1 .  levothyroxine (SYNTHROID, LEVOTHROID) 100 MCG tablet, Take 100 mcg by mouth daily., Disp: , Rfl: 3 .  metoprolol succinate (TOPROL-XL) 50 MG 24 hr tablet, Take 50 mg by mouth daily., Disp: , Rfl: 6 .  oxyCODONE-acetaminophen (PERCOCET) 7.5-325 MG tablet, Take 1 tablet by mouth every 4 (four) hours as needed. pain, Disp: 30 tablet, Rfl: 0 .  telmisartan-hydrochlorothiazide (MICARDIS HCT) 80-25 MG tablet, Take 1 tablet by mouth daily., Disp: , Rfl: 5  No Known Allergies Review of Systems  Constitutional: Positive for fatigue.  Respiratory: Positive for apnea.   Musculoskeletal: Positive for arthralgias and gait problem.  Skin: Positive for wound.  All other systems reviewed and are negative.  Objective:   Vitals:   01/24/17 1053  BP: (!) 146/96  Pulse: 78  Resp: 16   Vitals:   01/24/17 1053  BP: (!) 146/96  Pulse: 78  Resp: 16   General AA&O x3. Normal mood and affect.  Vascular Pedal pulses palpable.  Neurologic Epicritic sensation grossly intact.  Dermatologic No open lesions. Skin normal  texture and turgor.       Orthopedic: No pain to palpation either foot.    Assessment & Plan:  Patient was evaluated and treated and all questions answered.  Ulcer left first MPJ -She is taken reviewed no underlying osseous erosions. -Gently debrided with 312 blade and tissue nipper.  Betadine applied.  Patient to continue applying Betadine daily.  Follow-up in  week for recheck  No Follow-up on file.

## 2017-01-27 DIAGNOSIS — T8130XS Disruption of wound, unspecified, sequela: Secondary | ICD-10-CM | POA: Diagnosis not present

## 2017-01-28 DIAGNOSIS — I1310 Hypertensive heart and chronic kidney disease without heart failure, with stage 1 through stage 4 chronic kidney disease, or unspecified chronic kidney disease: Secondary | ICD-10-CM | POA: Diagnosis not present

## 2017-01-28 DIAGNOSIS — N182 Chronic kidney disease, stage 2 (mild): Secondary | ICD-10-CM | POA: Diagnosis not present

## 2017-01-28 DIAGNOSIS — M1990 Unspecified osteoarthritis, unspecified site: Secondary | ICD-10-CM | POA: Diagnosis not present

## 2017-01-28 DIAGNOSIS — Z9181 History of falling: Secondary | ICD-10-CM | POA: Diagnosis not present

## 2017-01-28 DIAGNOSIS — E1122 Type 2 diabetes mellitus with diabetic chronic kidney disease: Secondary | ICD-10-CM | POA: Diagnosis not present

## 2017-01-28 DIAGNOSIS — E7800 Pure hypercholesterolemia, unspecified: Secondary | ICD-10-CM | POA: Diagnosis not present

## 2017-01-28 DIAGNOSIS — L84 Corns and callosities: Secondary | ICD-10-CM | POA: Diagnosis not present

## 2017-01-28 DIAGNOSIS — Z87891 Personal history of nicotine dependence: Secondary | ICD-10-CM | POA: Diagnosis not present

## 2017-02-04 DIAGNOSIS — I1310 Hypertensive heart and chronic kidney disease without heart failure, with stage 1 through stage 4 chronic kidney disease, or unspecified chronic kidney disease: Secondary | ICD-10-CM | POA: Diagnosis not present

## 2017-02-04 DIAGNOSIS — M1990 Unspecified osteoarthritis, unspecified site: Secondary | ICD-10-CM | POA: Diagnosis not present

## 2017-02-04 DIAGNOSIS — N182 Chronic kidney disease, stage 2 (mild): Secondary | ICD-10-CM | POA: Diagnosis not present

## 2017-02-04 DIAGNOSIS — Z9181 History of falling: Secondary | ICD-10-CM | POA: Diagnosis not present

## 2017-02-04 DIAGNOSIS — E7800 Pure hypercholesterolemia, unspecified: Secondary | ICD-10-CM | POA: Diagnosis not present

## 2017-02-04 DIAGNOSIS — Z87891 Personal history of nicotine dependence: Secondary | ICD-10-CM | POA: Diagnosis not present

## 2017-02-04 DIAGNOSIS — E1122 Type 2 diabetes mellitus with diabetic chronic kidney disease: Secondary | ICD-10-CM | POA: Diagnosis not present

## 2017-02-04 DIAGNOSIS — L84 Corns and callosities: Secondary | ICD-10-CM | POA: Diagnosis not present

## 2017-02-07 DIAGNOSIS — M150 Primary generalized (osteo)arthritis: Secondary | ICD-10-CM | POA: Diagnosis not present

## 2017-02-07 DIAGNOSIS — G894 Chronic pain syndrome: Secondary | ICD-10-CM | POA: Diagnosis not present

## 2017-02-07 DIAGNOSIS — Z79891 Long term (current) use of opiate analgesic: Secondary | ICD-10-CM | POA: Diagnosis not present

## 2017-02-07 DIAGNOSIS — M25561 Pain in right knee: Secondary | ICD-10-CM | POA: Diagnosis not present

## 2017-02-13 ENCOUNTER — Ambulatory Visit: Payer: Medicare Other | Admitting: Podiatry

## 2017-02-13 DIAGNOSIS — N182 Chronic kidney disease, stage 2 (mild): Secondary | ICD-10-CM | POA: Diagnosis not present

## 2017-02-13 DIAGNOSIS — I1310 Hypertensive heart and chronic kidney disease without heart failure, with stage 1 through stage 4 chronic kidney disease, or unspecified chronic kidney disease: Secondary | ICD-10-CM | POA: Diagnosis not present

## 2017-02-13 DIAGNOSIS — Z87891 Personal history of nicotine dependence: Secondary | ICD-10-CM | POA: Diagnosis not present

## 2017-02-13 DIAGNOSIS — Z9181 History of falling: Secondary | ICD-10-CM | POA: Diagnosis not present

## 2017-02-13 DIAGNOSIS — M1990 Unspecified osteoarthritis, unspecified site: Secondary | ICD-10-CM | POA: Diagnosis not present

## 2017-02-13 DIAGNOSIS — E1122 Type 2 diabetes mellitus with diabetic chronic kidney disease: Secondary | ICD-10-CM | POA: Diagnosis not present

## 2017-02-13 DIAGNOSIS — L84 Corns and callosities: Secondary | ICD-10-CM | POA: Diagnosis not present

## 2017-02-13 DIAGNOSIS — E7800 Pure hypercholesterolemia, unspecified: Secondary | ICD-10-CM | POA: Diagnosis not present

## 2017-03-10 DIAGNOSIS — M25561 Pain in right knee: Secondary | ICD-10-CM | POA: Diagnosis not present

## 2017-03-10 DIAGNOSIS — G894 Chronic pain syndrome: Secondary | ICD-10-CM | POA: Diagnosis not present

## 2017-03-10 DIAGNOSIS — M150 Primary generalized (osteo)arthritis: Secondary | ICD-10-CM | POA: Diagnosis not present

## 2017-03-10 DIAGNOSIS — Z79891 Long term (current) use of opiate analgesic: Secondary | ICD-10-CM | POA: Diagnosis not present

## 2017-03-27 DIAGNOSIS — E890 Postprocedural hypothyroidism: Secondary | ICD-10-CM | POA: Diagnosis not present

## 2017-04-07 DIAGNOSIS — M150 Primary generalized (osteo)arthritis: Secondary | ICD-10-CM | POA: Diagnosis not present

## 2017-04-07 DIAGNOSIS — Z79891 Long term (current) use of opiate analgesic: Secondary | ICD-10-CM | POA: Diagnosis not present

## 2017-04-07 DIAGNOSIS — G894 Chronic pain syndrome: Secondary | ICD-10-CM | POA: Diagnosis not present

## 2017-04-07 DIAGNOSIS — M25561 Pain in right knee: Secondary | ICD-10-CM | POA: Diagnosis not present

## 2017-04-08 DIAGNOSIS — E119 Type 2 diabetes mellitus without complications: Secondary | ICD-10-CM | POA: Diagnosis not present

## 2017-04-08 DIAGNOSIS — E890 Postprocedural hypothyroidism: Secondary | ICD-10-CM | POA: Diagnosis not present

## 2017-04-08 DIAGNOSIS — I10 Essential (primary) hypertension: Secondary | ICD-10-CM | POA: Diagnosis not present

## 2017-05-08 DIAGNOSIS — Z79891 Long term (current) use of opiate analgesic: Secondary | ICD-10-CM | POA: Diagnosis not present

## 2017-05-08 DIAGNOSIS — G894 Chronic pain syndrome: Secondary | ICD-10-CM | POA: Diagnosis not present

## 2017-05-08 DIAGNOSIS — M150 Primary generalized (osteo)arthritis: Secondary | ICD-10-CM | POA: Diagnosis not present

## 2017-05-08 DIAGNOSIS — M25561 Pain in right knee: Secondary | ICD-10-CM | POA: Diagnosis not present

## 2017-05-12 ENCOUNTER — Encounter (INDEPENDENT_AMBULATORY_CARE_PROVIDER_SITE_OTHER): Payer: Medicare Other | Admitting: Ophthalmology

## 2017-05-12 DIAGNOSIS — H34811 Central retinal vein occlusion, right eye, with macular edema: Secondary | ICD-10-CM

## 2017-05-12 DIAGNOSIS — H43813 Vitreous degeneration, bilateral: Secondary | ICD-10-CM | POA: Diagnosis not present

## 2017-05-12 DIAGNOSIS — I10 Essential (primary) hypertension: Secondary | ICD-10-CM | POA: Diagnosis not present

## 2017-05-12 DIAGNOSIS — H35033 Hypertensive retinopathy, bilateral: Secondary | ICD-10-CM | POA: Diagnosis not present

## 2017-05-12 DIAGNOSIS — H348122 Central retinal vein occlusion, left eye, stable: Secondary | ICD-10-CM

## 2017-06-06 DIAGNOSIS — M25561 Pain in right knee: Secondary | ICD-10-CM | POA: Diagnosis not present

## 2017-06-06 DIAGNOSIS — Z79891 Long term (current) use of opiate analgesic: Secondary | ICD-10-CM | POA: Diagnosis not present

## 2017-06-06 DIAGNOSIS — G894 Chronic pain syndrome: Secondary | ICD-10-CM | POA: Diagnosis not present

## 2017-06-06 DIAGNOSIS — M150 Primary generalized (osteo)arthritis: Secondary | ICD-10-CM | POA: Diagnosis not present

## 2017-06-10 DIAGNOSIS — E890 Postprocedural hypothyroidism: Secondary | ICD-10-CM | POA: Diagnosis not present

## 2017-06-12 ENCOUNTER — Encounter (INDEPENDENT_AMBULATORY_CARE_PROVIDER_SITE_OTHER): Payer: Medicare Other | Admitting: Ophthalmology

## 2017-06-16 ENCOUNTER — Encounter (INDEPENDENT_AMBULATORY_CARE_PROVIDER_SITE_OTHER): Payer: Medicare Other | Admitting: Ophthalmology

## 2017-06-21 IMAGING — NM NM THYROID IMAGING W/ UPTAKE SINGLE (24 HR)
4 series · 4 of 4 positions shown · non-contrast
Comparison: None

CLINICAL DATA: Clinical hyperthyroidism.

EXAM:
THYROID SCAN AND UPTAKE - 24 HOURS
TECHNIQUE: Following the per oral administration of I 131 sodium iodide, the
patient returned at 24 hours and uptake measurements were acquired
with the uptake probe centered on the neck. Thyroid imaging was
performed following the intravenous administration of the 1c-55m
Pertechnetate.
RADIOPHARMACEUTICALS:  8.0 MicroCuries I 131 sodium iodide orally
and 10.2 mCi Vechnetium-JJm pertechnetate IV

[Series 1: anterior · 3.25mm/px · 1 of 1 slices shown]
[im 1/1]
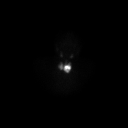

[Series 2: ant w marker · 3.25mm/px · 1 of 1 slices shown]
[im 1/1]
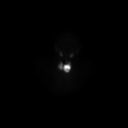

[Series 3: lao · 3.25mm/px · 1 of 1 slices shown]
[im 1/1]
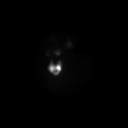

[Series 4: rao · 3.25mm/px · 1 of 1 slices shown]
[im 1/1]
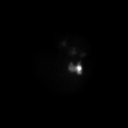

[4 of 4 positions shown; findings below may reference images not displayed]

FINDINGS: Heterogeneous uptake in the thyroid gland. Small cold nodules are
suspected bilaterally. Thyroid ultrasound may be helpful for
correlation.

24 hour uptake was 34.4%.  This is upper limits of normal.
IMPRESSION: Borderline elevated 24 hour iodine 131 uptake at 34.4%.

Heterogeneous uptake in the thyroid gland. Small cold nodules are
suspected. Recommend correlation with thyroid ultrasound.

## 2017-06-24 ENCOUNTER — Encounter (INDEPENDENT_AMBULATORY_CARE_PROVIDER_SITE_OTHER): Payer: Medicare Other | Admitting: Ophthalmology

## 2017-06-24 DIAGNOSIS — E113311 Type 2 diabetes mellitus with moderate nonproliferative diabetic retinopathy with macular edema, right eye: Secondary | ICD-10-CM | POA: Diagnosis not present

## 2017-06-24 DIAGNOSIS — H35033 Hypertensive retinopathy, bilateral: Secondary | ICD-10-CM | POA: Diagnosis not present

## 2017-06-24 DIAGNOSIS — E11311 Type 2 diabetes mellitus with unspecified diabetic retinopathy with macular edema: Secondary | ICD-10-CM | POA: Diagnosis not present

## 2017-06-24 DIAGNOSIS — H348122 Central retinal vein occlusion, left eye, stable: Secondary | ICD-10-CM | POA: Diagnosis not present

## 2017-06-24 DIAGNOSIS — H34811 Central retinal vein occlusion, right eye, with macular edema: Secondary | ICD-10-CM | POA: Diagnosis not present

## 2017-06-24 DIAGNOSIS — H43813 Vitreous degeneration, bilateral: Secondary | ICD-10-CM

## 2017-06-24 DIAGNOSIS — I10 Essential (primary) hypertension: Secondary | ICD-10-CM | POA: Diagnosis not present

## 2017-07-04 DIAGNOSIS — G894 Chronic pain syndrome: Secondary | ICD-10-CM | POA: Diagnosis not present

## 2017-07-04 DIAGNOSIS — M150 Primary generalized (osteo)arthritis: Secondary | ICD-10-CM | POA: Diagnosis not present

## 2017-07-04 DIAGNOSIS — Z79891 Long term (current) use of opiate analgesic: Secondary | ICD-10-CM | POA: Diagnosis not present

## 2017-07-04 DIAGNOSIS — M25561 Pain in right knee: Secondary | ICD-10-CM | POA: Diagnosis not present

## 2017-07-21 ENCOUNTER — Encounter (INDEPENDENT_AMBULATORY_CARE_PROVIDER_SITE_OTHER): Payer: Medicare Other | Admitting: Ophthalmology

## 2017-07-21 DIAGNOSIS — E11311 Type 2 diabetes mellitus with unspecified diabetic retinopathy with macular edema: Secondary | ICD-10-CM

## 2017-07-21 DIAGNOSIS — H43813 Vitreous degeneration, bilateral: Secondary | ICD-10-CM

## 2017-07-21 DIAGNOSIS — I10 Essential (primary) hypertension: Secondary | ICD-10-CM | POA: Diagnosis not present

## 2017-07-21 DIAGNOSIS — H34813 Central retinal vein occlusion, bilateral, with macular edema: Secondary | ICD-10-CM

## 2017-07-21 DIAGNOSIS — H35033 Hypertensive retinopathy, bilateral: Secondary | ICD-10-CM | POA: Diagnosis not present

## 2017-07-21 DIAGNOSIS — E113311 Type 2 diabetes mellitus with moderate nonproliferative diabetic retinopathy with macular edema, right eye: Secondary | ICD-10-CM

## 2017-07-21 DIAGNOSIS — H2513 Age-related nuclear cataract, bilateral: Secondary | ICD-10-CM

## 2017-09-01 ENCOUNTER — Encounter (INDEPENDENT_AMBULATORY_CARE_PROVIDER_SITE_OTHER): Payer: Medicare Other | Admitting: Ophthalmology

## 2017-09-01 DIAGNOSIS — I10 Essential (primary) hypertension: Secondary | ICD-10-CM

## 2017-09-01 DIAGNOSIS — H35372 Puckering of macula, left eye: Secondary | ICD-10-CM

## 2017-09-01 DIAGNOSIS — H35033 Hypertensive retinopathy, bilateral: Secondary | ICD-10-CM

## 2017-09-01 DIAGNOSIS — E11311 Type 2 diabetes mellitus with unspecified diabetic retinopathy with macular edema: Secondary | ICD-10-CM | POA: Diagnosis not present

## 2017-09-01 DIAGNOSIS — H34813 Central retinal vein occlusion, bilateral, with macular edema: Secondary | ICD-10-CM | POA: Diagnosis not present

## 2017-09-01 DIAGNOSIS — E113311 Type 2 diabetes mellitus with moderate nonproliferative diabetic retinopathy with macular edema, right eye: Secondary | ICD-10-CM

## 2017-09-01 DIAGNOSIS — H2513 Age-related nuclear cataract, bilateral: Secondary | ICD-10-CM

## 2017-09-01 DIAGNOSIS — H43813 Vitreous degeneration, bilateral: Secondary | ICD-10-CM

## 2017-10-10 ENCOUNTER — Encounter (INDEPENDENT_AMBULATORY_CARE_PROVIDER_SITE_OTHER): Payer: Medicare Other | Admitting: Ophthalmology

## 2017-10-10 DIAGNOSIS — E11311 Type 2 diabetes mellitus with unspecified diabetic retinopathy with macular edema: Secondary | ICD-10-CM

## 2017-10-10 DIAGNOSIS — E113311 Type 2 diabetes mellitus with moderate nonproliferative diabetic retinopathy with macular edema, right eye: Secondary | ICD-10-CM

## 2017-10-10 DIAGNOSIS — H35033 Hypertensive retinopathy, bilateral: Secondary | ICD-10-CM | POA: Diagnosis not present

## 2017-10-10 DIAGNOSIS — I10 Essential (primary) hypertension: Secondary | ICD-10-CM | POA: Diagnosis not present

## 2017-10-10 DIAGNOSIS — H34813 Central retinal vein occlusion, bilateral, with macular edema: Secondary | ICD-10-CM

## 2017-10-13 ENCOUNTER — Encounter (INDEPENDENT_AMBULATORY_CARE_PROVIDER_SITE_OTHER): Payer: Medicare Other | Admitting: Ophthalmology

## 2017-10-13 ENCOUNTER — Telehealth: Payer: Self-pay | Admitting: Internal Medicine

## 2017-10-13 NOTE — Telephone Encounter (Signed)
I left a message letting the patient know that her yearly Medicare questionnaire has been scheduled on 10/22/17 with [place]. VDM (DD) °

## 2017-10-17 ENCOUNTER — Other Ambulatory Visit: Payer: Self-pay

## 2017-10-17 MED ORDER — OMEPRAZOLE 20 MG PO CPDR: 20.0000 mg | DELAYED_RELEASE_CAPSULE | Freq: Every day | ORAL | 1 refills | Status: DC

## 2017-10-17 MED ORDER — CETIRIZINE HCL 10 MG PO CAPS: 1.0000 | ORAL_CAPSULE | Freq: Every day | ORAL | 1 refills | Status: DC

## 2017-10-22 ENCOUNTER — Ambulatory Visit: Payer: Medicare Other

## 2017-10-30 ENCOUNTER — Ambulatory Visit: Payer: Medicare Other

## 2017-10-30 ENCOUNTER — Ambulatory Visit (INDEPENDENT_AMBULATORY_CARE_PROVIDER_SITE_OTHER): Payer: Medicare Other

## 2017-10-30 VITALS — BP 158/88 | HR 91 | Temp 97.8°F | Ht 66.0 in | Wt 291.0 lb

## 2017-10-30 DIAGNOSIS — E2839 Other primary ovarian failure: Secondary | ICD-10-CM | POA: Diagnosis not present

## 2017-10-30 DIAGNOSIS — Z1231 Encounter for screening mammogram for malignant neoplasm of breast: Secondary | ICD-10-CM | POA: Diagnosis not present

## 2017-10-30 DIAGNOSIS — Z23 Encounter for immunization: Secondary | ICD-10-CM

## 2017-10-30 DIAGNOSIS — Z0000 Encounter for general adult medical examination without abnormal findings: Secondary | ICD-10-CM | POA: Diagnosis not present

## 2017-10-30 NOTE — Patient Instructions (Signed)
Mary [name] , Thank you for taking time to come for your Medicare Wellness Visit. I appreciate your ongoing commitment to your health goals. Please review the following plan we discussed and let me know if I can assist you in the future.   Screening recommendations/referrals: Colonoscopy: refuses  Mammogram: referral sent Bone Density: referral sent Recommended yearly ophthalmology/optometry visit for glaucoma screening and checkup Recommended yearly dental visit for hygiene and checkup  Vaccinations: Influenza vaccine: 10/16/2017 Pneumococcal vaccine: prevnar 13 order sent to pharmacy Tdap vaccine: 02/15/2013 Shingles vaccine: decline    Advanced directives: Please bring a copy of your POA (Power of [place]) and/or Living Will to your next appointment.    Conditions/risks identified: Obesity:Patient has set a goal to exercise more. Next appointment: 11/18/2017 at 10:00a   Preventive Care 65 Years and Older, Female Preventive care refers to lifestyle choices and visits with your health care provider that can promote health and wellness. What does preventive care include?  A yearly physical exam. This is also called an annual well check.  Dental exams once or twice a year.  Routine eye exams. Ask your health care provider how often you should have your eyes checked.  Personal lifestyle choices, including:  Daily care of your teeth and gums.  Regular physical activity.  Eating a healthy diet.  Avoiding tobacco and drug use.  Limiting alcohol use.  Practicing safe sex.  Taking low-dose aspirin every day.  Taking vitamin and mineral supplements as recommended by your health care provider. What happens during an annual well check? The services and screenings done by your health care provider during your annual well check will depend on your age, overall health, lifestyle risk factors, and family history of disease. Counseling  Your health care provider may ask you  questions about your:  Alcohol use.  Tobacco use.  Drug use.  Emotional well-being.  Home and relationship well-being.  Sexual activity.  Eating habits.  History of falls.  Memory and ability to understand (cognition).  Work and work Astronomer.  Reproductive health. Screening  You may have the following tests or measurements:  Height, weight, and BMI.  Blood pressure.  Lipid and cholesterol levels. These may be checked every 5 years, or more frequently if you are over 75 years old.  Skin check.  Lung cancer screening. You may have this screening every year starting at age 75 if you have a 30-pack-year history of smoking and currently smoke or have quit within the past 15 years.  Fecal occult blood test (FOBT) of the stool. You may have this test every year starting at age 85.  Flexible sigmoidoscopy or colonoscopy. You may have a sigmoidoscopy every 5 years or a colonoscopy every 10 years starting at age 57.  Hepatitis C blood test.  Hepatitis B blood test.  Sexually transmitted disease (STD) testing.  Diabetes screening. This is done by checking your blood sugar (glucose) after you have not eaten for a while (fasting). You may have this done every 1-3 years.  Bone density scan. This is done to screen for osteoporosis. You may have this done starting at age 61.  Mammogram. This may be done every 1-2 years. Talk to your health care provider about how often you should have regular mammograms. Talk with your health care provider about your test results, treatment options, and if necessary, the need for more tests. Vaccines  Your health care provider may recommend certain vaccines, such as:  Influenza vaccine. This is recommended every year.  Tetanus, diphtheria, and acellular pertussis (Tdap, Td) vaccine. You may need a Td booster every 10 years.  Zoster vaccine. You may need this after age 31.  Pneumococcal 13-valent conjugate (PCV13) vaccine. One dose is  recommended after age 15.  Pneumococcal polysaccharide (PPSV23) vaccine. One dose is recommended after age 79. Talk to your health care provider about which screenings and vaccines you need and how often you need them. This information is not intended to replace advice given to you by your health care provider. Make sure you discuss any questions you have with your health care provider. Document Released: 01/27/2015 Document Revised: 09/20/2015 Document Reviewed: 11/01/2014 Elsevier Interactive Patient Education  2017 [place] Prevention in the Home Falls can cause injuries. They can happen to people of all ages. There are many things you can do to make your home safe and to help prevent falls. What can I do on the outside of my home?  Regularly fix the edges of walkways and driveways and fix any cracks.  Remove anything that might make you trip as you walk through a door, such as a raised step or threshold.  Trim any bushes or trees on the path to your home.  Use bright outdoor lighting.  Clear any walking paths of anything that might make someone trip, such as rocks or tools.  Regularly check to see if handrails are loose or broken. Make sure that both sides of any steps have handrails.  Any raised decks and porches should have guardrails on the edges.  Have any leaves, snow, or ice cleared regularly.  Use sand or salt on walking paths during winter.  Clean up any spills in your garage right away. This includes oil or grease spills. What can I do in the bathroom?  Use night lights.  Install grab bars by the toilet and in the tub and shower. Do not use towel bars as grab bars.  Use non-skid mats or decals in the tub or shower.  If you need to sit down in the shower, use a plastic, non-slip stool.  Keep the floor dry. Clean up any water that spills on the floor as soon as it happens.  Remove soap buildup in the tub or shower regularly.  Attach bath mats  securely with double-sided non-slip rug tape.  Do not have throw rugs and other things on the floor that can make you trip. What can I do in the bedroom?  Use night lights.  Make sure that you have a light by your bed that is easy to reach.  Do not use any sheets or blankets that are too big for your bed. They should not hang down onto the floor.  Have a firm chair that has side arms. You can use this for support while you get dressed.  Do not have throw rugs and other things on the floor that can make you trip. What can I do in the kitchen?  Clean up any spills right away.  Avoid walking on wet floors.  Keep items that you use a lot in easy-to-reach places.  If you need to reach something above you, use a strong step stool that has a grab bar.  Keep electrical cords out of the way.  Do not use floor polish or wax that makes floors slippery. If you must use wax, use non-skid floor wax.  Do not have throw rugs and other things on the floor that can make you trip. What can I do  with my stairs?  Do not leave any items on the stairs.  Make sure that there are handrails on both sides of the stairs and use them. Fix handrails that are broken or loose. Make sure that handrails are as long as the stairways.  Check any carpeting to make sure that it is firmly attached to the stairs. Fix any carpet that is loose or worn.  Avoid having throw rugs at the top or bottom of the stairs. If you do have throw rugs, attach them to the floor with carpet tape.  Make sure that you have a light switch at the top of the stairs and the bottom of the stairs. If you do not have them, ask someone to add them for you. What else can I do to help prevent falls?  Wear shoes that:  Do not have high heels.  Have rubber bottoms.  Are comfortable and fit you well.  Are closed at the toe. Do not wear sandals.  If you use a stepladder:  Make sure that it is fully opened. Do not climb a closed  stepladder.  Make sure that both sides of the stepladder are locked into place.  Ask someone to hold it for you, if possible.  Clearly mark and make sure that you can see:  Any grab bars or handrails.  First and last steps.  Where the edge of each step is.  Use tools that help you move around (mobility aids) if they are needed. These include:  Canes.  Walkers.  Scooters.  Crutches.  Turn on the lights when you go into a dark area. Replace any light bulbs as soon as they burn out.  Set up your furniture so you have a clear path. Avoid moving your furniture around.  If any of your floors are uneven, fix them.  If there are any pets around you, be aware of where they are.  Review your medicines with your doctor. Some medicines can make you feel dizzy. This can increase your chance of falling. Ask your doctor what other things that you can do to help prevent falls. This information is not intended to replace advice given to you by your health care provider. Make sure you discuss any questions you have with your health care provider. Document Released: 10/27/2008 Document Revised: 06/08/2015 Document Reviewed: 02/04/2014 Elsevier Interactive Patient Education  2017 [employer].

## 2017-10-30 NOTE — Progress Notes (Signed)
y  Subjective:   Mary [name] is a 72 y.o. female who presents for Medicare Annual (Subsequent) preventive examination.  Review of Systems:  N/A Cardiac Risk Factors include: advanced age (>8men, >65 women);diabetes mellitus;hypertension;obesity (BMI >30kg/m2);sedentary lifestyle     Objective:     Vitals: BP (!) 158/88 (BP Location: Left Arm)   Pulse 91   Temp 97.8 F (36.6 C)   Ht 5\' 6"  (1.676 m)   Wt 291 lb (132 kg)   BMI 46.97 kg/m   Body mass index is 46.97 kg/m.  Advanced Directives 10/30/2017 07/16/2015 07/08/2015 06/06/2015 05/13/2012 02/14/2012 10/21/2011  Does Patient Have a Medical Advance Directive? Yes No No No Patient does not have advance directive Patient does not have advance directive;Patient would not like information Patient does not have advance directive  Type of Advance Directive Healthcare Power of [place];Living will - - - - - -  Does patient want to make changes to medical advance directive? No - Patient declined - - - - - -  Copy of Healthcare Power of Attorney in Chart? No - copy requested - - - - - -  Would patient like information on creating a medical advance directive? - - No - patient declined information No - patient declined information - - -  Pre-existing out of facility DNR order (yellow form or pink MOST form) - - - - - - No    Tobacco Social History   Tobacco Use  Smoking Status Former Smoker  . Packs/day: 0.25  . Years: 20.00  . Pack years: 5.00  . Types: Cigarettes  . Last attempt to quit: 06/28/2014  . Years since quitting: 3.3  Smokeless Tobacco Never Used     Counseling given: Not Answered   Clinical Intake:  Pre-visit preparation completed: Yes  Pain : No/denies pain Pain Score: 0-No pain     Nutritional Status: BMI > 30  Obese Nutritional Risks: Nausea/ vomitting/ diarrhea Diabetes: Yes CBG done?: No Did pt. bring in CBG monitor from home?: No  How often do you need to have someone help you when you read  instructions, pamphlets, or other written materials from your doctor or pharmacy?: 1 - Never What is the last grade level you completed in school?: 12 th grade  Interpreter Needed?: No  Information entered by :: NAllen LPN  Past Medical History:  Diagnosis Date  . Arthritis    osteoarthritis  . Diabetes mellitus    Insulin dependent  . H/O cardiovascular stress test    pt. reports 10/13- was normal per pt.   . Hyperlipemia   . Hypertension    Past Surgical History:  Procedure Laterality Date  . CARPAL TUNNEL RELEASE Bilateral   . KNEE ARTHROSCOPY     right  . QUADRICEPS TENDON REPAIR  02/17/2012   Procedure: REPAIR QUADRICEP TENDON;  Surgeon: Dannielle Huh, MD;  Location: [facility];  Service: Orthopedics;  Laterality: Right;  right quadriceps repair with latera release  . QUADRICEPS TENDON REPAIR Right 05/15/2012   Procedure: REPAIR QUADRICEP TENDON;  Surgeon: Dannielle Huh, MD;  Location: [facility];  Service: Orthopedics;  Laterality: Right;  . TOTAL KNEE ARTHROPLASTY  10/21/2011   rt tk  . TOTAL KNEE ARTHROPLASTY  10/21/2011   Procedure: TOTAL KNEE ARTHROPLASTY;  Surgeon: Raymon Mutton, MD;  Location: [facility];  Service: Orthopedics;  Laterality: Right;  . TUBAL LIGATION     History reviewed. No pertinent family history. Social History   Socioeconomic History  . Marital status: Single  Spouse name: Not on file  . Number of children: Not on file  . Years of education: Not on file  . Highest education level: Not on file  Occupational History  . Occupation: retired  Engineer, production  . Financial resource strain: Not hard at all  . Food insecurity:    Worry: Never true    Inability: Never true  . Transportation needs:    Medical: No    Non-medical: No  Tobacco Use  . Smoking status: Former Smoker    Packs/day: 0.25    Years: 20.00    Pack years: 5.00    Types: Cigarettes    Last attempt to quit: 06/28/2014    Years since quitting: 3.3  . Smokeless tobacco: Never Used  Substance  and Sexual Activity  . Alcohol use: No  . Drug use: Yes    Types: Oxycodone    Comment: Uses as needed for pain  . Sexual activity: Not Currently  Lifestyle  . Physical activity:    Days per week: 2 days    Minutes per session: 20 min  . Stress: Not at all  Relationships  . Social connections:    Talks on phone: Not on file    Gets together: Not on file    Attends religious service: Not on file    Active member of club or organization: Not on file    Attends meetings of clubs or organizations: Not on file    Relationship status: Not on file  Other Topics Concern  . Not on file  Social History Narrative  . Not on file    Outpatient Encounter Medications as of 10/30/2017  Medication Sig  . aspirin EC 81 MG tablet Take 81 mg by mouth daily.  . cetirizine (ZYRTEC) 10 MG tablet Take 10 mg by mouth daily.  . fluticasone (FLONASE) 50 MCG/ACT nasal spray Place 2 sprays into the nose daily. (Patient taking differently: Place 2 sprays into the nose daily as needed for allergies. )  . furosemide (LASIX) 40 MG tablet Take 40 mg by mouth daily.  [name] KLOR-CON M20 20 MEQ tablet Take 20 mEq by mouth daily. with food  . levothyroxine (SYNTHROID, LEVOTHROID) 100 MCG tablet Take 100 mcg by mouth daily.  [name] oxyCODONE-acetaminophen (PERCOCET) 7.5-325 MG tablet Take 1 tablet by mouth every 4 (four) hours as needed. pain  . OZEMPIC, 0.25 OR 0.5 MG/DOSE, 2 MG/1.5ML SOPN INJECT 0.5 MG INTO SKIN EVERY WEEK ON SAME DAY EACH WEEK IN ABDOMEN, THIGHS, OR UPPER ARM  . telmisartan-hydrochlorothiazide (MICARDIS HCT) 80-25 MG tablet Take 1 tablet by mouth daily.  . Dulaglutide (TRULICITY) 0.75 MG/0.5ML SOPN Inject 0.75 mg into the skin every 7 (seven) days. TUESDAY  . metoprolol succinate (TOPROL-XL) 50 MG 24 hr tablet Take 50 mg by mouth daily.   No facility-administered encounter medications on file as of 10/30/2017.     Activities of Daily Living In your present state of health, do you have any difficulty  performing the following activities: 10/30/2017  Hearing? N  Vision? 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 N  Walking or climbing stairs? Y  Comment Uses walker  Dressing or bathing? N  Doing errands, shopping? Y  Comment Can not stand up long enough. Some takes her  Preparing Food and eating ? Y  Comment Can not stand up long for soome cooking tasks  Using the Toilet? N  In the past six months, have you accidently leaked  urine? N  Do you have problems with loss of bowel control? N  Managing your Medications? N  Managing your Finances? N  Housekeeping or managing your Housekeeping? N  Some recent data might be hidden    Patient Care Team: Dorothyann Peng, MD as PCP - General (Internal Medicine) Sherrie George, MD as Consulting Physician (Ophthalmology) Dorisann Frames, MD as Consulting Physician (Endocrinology)    Assessment:   This is a routine wellness examination for Mary [name].  Exercise Activities and Dietary recommendations Current Exercise Habits: Home exercise routine, Type of exercise: Other - see comments(leg exercises at home), Time (Minutes): 15, Frequency (Times/Week): 2, Weekly Exercise (Minutes/Week): 30, Intensity: Mild, Exercise limited by: orthopedic condition(s)  Goals    . Exercise 150 min/wk Moderate Activity (pt-stated)       Fall Risk Fall Risk  10/30/2017  Falls in the past year? Yes  Number falls in past yr: 1  Comment Slipped out the new bed  Injury with Fall? No  Risk for fall due to : History of fall(s);Impaired balance/gait;Impaired mobility;Medication side effect  Follow up Falls prevention discussed   Is the patient's home free of loose throw rugs in walkways, pet beds, electrical cords, etc?   yes      Grab bars in the bathroom? no      Handrails on the stairs?   N/A      Adequate lighting?   yes  Timed Get Up and Go performed: N/A  Depression Screen PHQ 2/9 Scores 10/30/2017  PHQ - 2  Score 0     Cognitive Function     6CIT Screen 10/30/2017  What Year? 0 points  What month? 0 points  What time? 0 points  Count back from 20 0 points  Months in reverse 0 points  Repeat phrase 0 points  Total Score 0    Immunization History  Administered Date(s) Administered  . Influenza Split 10/22/2011  . Influenza-Unspecified 10/16/2017  . Pneumococcal Polysaccharide-23 10/15/2016  . Tdap 02/15/2013    Qualifies for Shingles Vaccine? yes  Screening Tests Health Maintenance  Topic Date Due  . Hepatitis C Screening  05-22-1945  . DEXA SCAN  12/20/2012  . PNA vac Low Risk Adult (1 of 2 - PCV13) 12/20/2012  . MAMMOGRAM  09/11/2015  . COLONOSCOPY  10/31/2018 (Originally 12/20/1997)  . TETANUS/TDAP  02/16/2023  . INFLUENZA VACCINE  Completed    Cancer Screenings: Lung: Low Dose CT Chest recommended if Age 42-80 years, 30 pack-year currently smoking OR have quit w/in 15years. Patient does not qualify. Breast:  Up to date on Mammogram? No   Up to date of Bone Density/Dexa? No Colorectal: refuses  Additional Screenings: : Hepatitis C Screening: due     Plan:    Patient would like to exercise more. Referrals will be sent for mammogram and BMD. Patient reuses colonoscopy. Prevnar 13 ordered to be given at pharmacy. She is due for a FOBT in 2020. States she has an appointment set for diabetic eye exam coming up.   I have personally reviewed and noted the following in the patient's chart:   . Medical and social history . Use of alcohol, tobacco or illicit drugs  . Current medications and supplements . Functional ability and status . Nutritional status . Physical activity . Advanced directives . List of other physicians . Hospitalizations, surgeries, and ER visits in previous 12 months . Vitals . Screenings to include cognitive, depression, and falls . Referrals and appointments  In addition, I have  reviewed and discussed with patient certain preventive  protocols, quality metrics, and best practice recommendations. A written personalized care plan for preventive services as well as general preventive health recommendations were provided to patient.     Barb Merino, LPN  16/10/9602

## 2017-11-16 ENCOUNTER — Encounter: Payer: Self-pay | Admitting: Internal Medicine

## 2017-11-18 ENCOUNTER — Encounter: Payer: Self-pay | Admitting: Internal Medicine

## 2017-11-18 ENCOUNTER — Ambulatory Visit (INDEPENDENT_AMBULATORY_CARE_PROVIDER_SITE_OTHER): Payer: Medicare Other | Admitting: Internal Medicine

## 2017-11-18 VITALS — BP 126/78 | HR 73 | Temp 97.8°F | Ht 66.0 in | Wt 290.8 lb

## 2017-11-18 DIAGNOSIS — I129 Hypertensive chronic kidney disease with stage 1 through stage 4 chronic kidney disease, or unspecified chronic kidney disease: Secondary | ICD-10-CM | POA: Diagnosis not present

## 2017-11-18 DIAGNOSIS — Z7982 Long term (current) use of aspirin: Secondary | ICD-10-CM

## 2017-11-18 DIAGNOSIS — Z6841 Body Mass Index (BMI) 40.0 and over, adult: Secondary | ICD-10-CM

## 2017-11-18 DIAGNOSIS — N183 Chronic kidney disease, stage 3 unspecified: Secondary | ICD-10-CM

## 2017-11-18 DIAGNOSIS — E1122 Type 2 diabetes mellitus with diabetic chronic kidney disease: Secondary | ICD-10-CM | POA: Diagnosis not present

## 2017-11-18 LAB — POCT URINALYSIS DIPSTICK
Bilirubin, UA: NEGATIVE
Blood, UA: NEGATIVE
Glucose, UA: NEGATIVE
KETONES UA: NEGATIVE
NITRITE UA: NEGATIVE
PROTEIN UA: NEGATIVE
Spec Grav, UA: 1.01 (ref 1.010–1.025)
Urobilinogen, UA: 1 E.U./dL
pH, UA: 6 (ref 5.0–8.0)

## 2017-11-18 LAB — POCT UA - MICROALBUMIN
CREATININE, POC: 100 mg/dL
Microalbumin Ur, POC: 30 mg/L

## 2017-11-18 NOTE — Patient Instructions (Signed)

## 2017-11-18 NOTE — Progress Notes (Signed)
Subjective:     Patient ID: Mary [name] , female    DOB: 1946/01/07 , 73 y.o.   MRN: [id number]   Chief Complaint  Patient presents with  . Diabetes  . Hypertension    HPI  Diabetes  She presents for her follow-up diabetic visit. She has type 2 diabetes mellitus. Her disease course has been stable. There are no hypoglycemic associated symptoms. Pertinent negatives for hypoglycemia include no headaches. Pertinent negatives for diabetes include no blurred vision and no chest pain. There are no hypoglycemic complications. Diabetic complications include nephropathy. She is following a diabetic diet.  Hypertension  This is a chronic problem. The current episode started more than 1 year ago. Pertinent negatives include no blurred vision, chest pain, headaches, orthopnea, palpitations or shortness of breath. Risk factors for coronary artery disease include diabetes mellitus, dyslipidemia, post-menopausal state and sedentary lifestyle.  She reports compliance with meds.    Past Medical History:  Diagnosis Date  . Arthritis    osteoarthritis  . Diabetes mellitus    Insulin dependent  . H/O cardiovascular stress test    pt. reports 10/13- was normal per pt.   . Hyperlipemia   . Hypertension      Family History  Problem Relation Age of Onset  . Hypertension Mother   . Heart Problems Father   . Gout Father   . Arthritis Father      Current Outpatient Medications:  .  aspirin EC 81 MG tablet, Take 81 mg by mouth daily., Disp: , Rfl:  .  cetirizine (ZYRTEC) 10 MG tablet, Take 10 mg by mouth daily., Disp: , Rfl:  .  fluticasone (FLONASE) 50 MCG/ACT nasal spray, Place 2 sprays into the nose daily. (Patient taking differently: Place 2 sprays into the nose daily as needed for allergies. ), Disp: 16 g, Rfl: 0 .  furosemide (LASIX) 40 MG tablet, Take 40 mg by mouth daily., Disp: , Rfl:  .  KLOR-CON M20 20 MEQ tablet, Take 20 mEq by mouth daily. with food, Disp: , Rfl: 1 .  levothyroxine  (SYNTHROID, LEVOTHROID) 100 MCG tablet, Take 100 mcg by mouth daily., Disp: , Rfl: 3 .  metoprolol succinate (TOPROL-XL) 50 MG 24 hr tablet, Take 50 mg by mouth daily., Disp: , Rfl: 6 .  oxyCODONE-acetaminophen (PERCOCET) 7.5-325 MG tablet, Take 1 tablet by mouth every 4 (four) hours as needed. pain, Disp: 30 tablet, Rfl: 0 .  OZEMPIC, 0.25 OR 0.5 MG/DOSE, 2 MG/1.5ML SOPN, INJECT 0.5 MG INTO SKIN EVERY WEEK ON SAME DAY EACH WEEK IN ABDOMEN, THIGHS, OR UPPER ARM, Disp: , Rfl: 1 .  telmisartan-hydrochlorothiazide (MICARDIS HCT) 80-25 MG tablet, Take 1 tablet by mouth daily., Disp: , Rfl: 5   No Known Allergies   Review of Systems  Constitutional: Negative.   Eyes: Negative for blurred vision.  Respiratory: Negative.  Negative for shortness of breath.   Cardiovascular: Negative.  Negative for chest pain, palpitations and orthopnea.  Gastrointestinal: Negative.   Genitourinary: Negative.   Neurological: Negative.  Negative for headaches.  Psychiatric/Behavioral: Negative.      Today's Vitals   11/18/17 1013  BP: 126/78  Pulse: 73  Temp: 97.8 F (36.6 C)  TempSrc: Oral  Weight: 290 lb 12.8 oz (131.9 kg)  Height: 5\' 6"  (1.676 m)  PainSc: 6   PainLoc: Arm   Body mass index is 46.94 kg/m.   Objective:  Physical Exam  Constitutional: She is oriented to person, place, and time. She appears well-developed and well-nourished.  HENT:  Head: Normocephalic and atraumatic.  Eyes: EOM are normal.  Cardiovascular: Normal rate, regular rhythm, normal heart sounds and intact distal pulses.  Pulmonary/Chest: Effort normal and breath sounds normal.  Neurological: She is alert and oriented to person, place, and time.  Psychiatric: She has a normal mood and affect.  Nursing note and vitals reviewed.       Assessment And Plan:     1. Diabetes mellitus with stage 3 chronic kidney disease (HCC)  I will check a CMP, hba1c today. She is encouraged to avoid sugary beverages including sodas,  juices and sweet tea. She is encouraged to also incorporate more chair exercises into her daily routine.   2. Chronic renal disease, stage III (HCC)  Chronic, I will check a GFR, Cr today.   3. Hypertensive nephropathy  Controlled. She will continue with current meds. She is encouraged to avoid adding salt to her foods.   4. Class 3 severe obesity due to excess calories with serious comorbidity and body mass index (BMI) of 45.0 to 49.9 in adult [facility])  Chronic. She is encouraged to strive for BMI less than 40 to decrease cardiac risk. Obesity and its ncreased risk of CAD, CVA and certain cancers were discussed with the patient.   Gwynneth Aliment, MD

## 2017-11-19 LAB — CMP14+EGFR
A/G RATIO: 1.3 (ref 1.2–2.2)
ALBUMIN: 4.5 g/dL (ref 3.6–4.8)
ALK PHOS: 102 IU/L (ref 39–117)
ALT: 9 IU/L (ref 0–32)
AST: 19 IU/L (ref 0–40)
BUN/Creatinine Ratio: 28 (ref 12–28)
BUN: 56 mg/dL — ABNORMAL HIGH (ref 8–27)
Bilirubin Total: 0.7 mg/dL (ref 0.0–1.2)
CO2: 24 mmol/L (ref 20–29)
CREATININE: 2.01 mg/dL — AB (ref 0.57–1.00)
Calcium: 10.1 mg/dL (ref 8.7–10.3)
Chloride: 94 mmol/L — ABNORMAL LOW (ref 96–106)
GFR calc Af Amer: 29 mL/min/{1.73_m2} — ABNORMAL LOW (ref >59)
GFR calc non Af Amer: 25 mL/min/{1.73_m2} — ABNORMAL LOW (ref >59)
GLOBULIN, TOTAL: 3.5 g/dL (ref 1.5–4.5)
Glucose: 110 mg/dL — ABNORMAL HIGH (ref 65–99)
POTASSIUM: 4.5 mmol/L (ref 3.5–5.2)
SODIUM: 137 mmol/L (ref 134–144)
Total Protein: 8 g/dL (ref 6.0–8.5)

## 2017-11-19 LAB — LIPID PANEL
Chol/HDL Ratio: 3.6 ratio (ref 0.0–4.4)
Cholesterol, Total: 182 mg/dL (ref 100–199)
HDL: 51 mg/dL (ref >39)
LDL Calculated: 103 mg/dL — ABNORMAL HIGH (ref 0–99)
Triglycerides: 138 mg/dL (ref 0–149)
VLDL Cholesterol Cal: 28 mg/dL (ref 5–40)

## 2017-11-19 LAB — HEMOGLOBIN A1C
Est. average glucose Bld gHb Est-mCnc: 97 mg/dL
HEMOGLOBIN A1C: 5 % (ref 4.8–5.6)

## 2017-11-19 LAB — HEPATITIS C ANTIBODY: Hep C Virus Ab: <0.1 s/co ratio (ref 0.0–0.9)

## 2017-11-22 ENCOUNTER — Other Ambulatory Visit: Payer: Self-pay

## 2017-11-22 MED ORDER — OMEPRAZOLE 20 MG PO CPDR: 20.0000 mg | DELAYED_RELEASE_CAPSULE | Freq: Every day | ORAL | 1 refills | Status: DC

## 2017-11-22 MED ORDER — TELMISARTAN-HCTZ 80-25 MG PO TABS: 1.0000 | ORAL_TABLET | Freq: Every day | ORAL | 1 refills | Status: DC

## 2017-11-23 NOTE — Progress Notes (Signed)
You are neg for hepatitis c virus. Liver fxn is nl. Kidney fxn is decreased. Does she take lasix every day?  Pls let me know when she is done with current supply of telmisartan/hctz. I will change to formulation witout hct if she is taking lasix daily. This could be drying her out too much. I will need to recheck her bp four weeks after this change. Be sure to stay well hydrated. a1c is great. Ldl, bad chol is 103, as a diabetic should be less than 70.

## 2017-11-24 ENCOUNTER — Telehealth: Payer: Self-pay

## 2017-11-24 ENCOUNTER — Encounter (INDEPENDENT_AMBULATORY_CARE_PROVIDER_SITE_OTHER): Payer: Medicare Other | Admitting: Ophthalmology

## 2017-11-24 DIAGNOSIS — H34813 Central retinal vein occlusion, bilateral, with macular edema: Secondary | ICD-10-CM

## 2017-11-24 DIAGNOSIS — I10 Essential (primary) hypertension: Secondary | ICD-10-CM

## 2017-11-24 DIAGNOSIS — H35033 Hypertensive retinopathy, bilateral: Secondary | ICD-10-CM | POA: Diagnosis not present

## 2017-11-24 DIAGNOSIS — H43813 Vitreous degeneration, bilateral: Secondary | ICD-10-CM

## 2017-11-24 DIAGNOSIS — E11311 Type 2 diabetes mellitus with unspecified diabetic retinopathy with macular edema: Secondary | ICD-10-CM | POA: Diagnosis not present

## 2017-11-24 DIAGNOSIS — E113311 Type 2 diabetes mellitus with moderate nonproliferative diabetic retinopathy with macular edema, right eye: Secondary | ICD-10-CM

## 2017-11-24 NOTE — Telephone Encounter (Signed)
-----   Message from Dorothyann Peng, MD sent at 11/23/2017 12:00 PM EST ----- You are neg for hepatitis c virus. Liver fxn is nl. Kidney fxn is decreased. Does she take lasix every day?  Pls let me know when she is done with current supply of telmisartan/hctz. I will change to formulation witout hct if she is taking lasix daily. This could be drying her out too much. I will need to recheck her bp four weeks after this change. Be sure to stay well hydrated. a1c is great. Ldl, bad chol is 103, as a diabetic should be less than 70.

## 2017-11-24 NOTE — Telephone Encounter (Signed)
Left a message for the pt to cal back for her lab results.

## 2017-11-25 ENCOUNTER — Other Ambulatory Visit: Payer: Self-pay

## 2017-11-26 NOTE — Progress Notes (Signed)
Did you refill the telmisartan already since she is low? If no, pls send #90/1 refill. thanks

## 2017-11-28 ENCOUNTER — Other Ambulatory Visit: Payer: Self-pay

## 2017-11-28 MED ORDER — TELMISARTAN 80 MG PO TABS: 80.0000 mg | ORAL_TABLET | Freq: Every day | ORAL | 1 refills | Status: DC

## 2017-12-01 ENCOUNTER — Encounter (INDEPENDENT_AMBULATORY_CARE_PROVIDER_SITE_OTHER): Payer: Medicare Other | Admitting: Ophthalmology

## 2017-12-01 DIAGNOSIS — I10 Essential (primary) hypertension: Secondary | ICD-10-CM

## 2017-12-01 DIAGNOSIS — E11311 Type 2 diabetes mellitus with unspecified diabetic retinopathy with macular edema: Secondary | ICD-10-CM

## 2017-12-01 DIAGNOSIS — H34813 Central retinal vein occlusion, bilateral, with macular edema: Secondary | ICD-10-CM | POA: Diagnosis not present

## 2017-12-01 DIAGNOSIS — H43813 Vitreous degeneration, bilateral: Secondary | ICD-10-CM

## 2017-12-01 DIAGNOSIS — E113311 Type 2 diabetes mellitus with moderate nonproliferative diabetic retinopathy with macular edema, right eye: Secondary | ICD-10-CM

## 2017-12-01 DIAGNOSIS — H35033 Hypertensive retinopathy, bilateral: Secondary | ICD-10-CM | POA: Diagnosis not present

## 2017-12-01 DIAGNOSIS — H2513 Age-related nuclear cataract, bilateral: Secondary | ICD-10-CM

## 2017-12-02 ENCOUNTER — Other Ambulatory Visit: Payer: Self-pay | Admitting: Internal Medicine

## 2017-12-02 ENCOUNTER — Other Ambulatory Visit: Payer: Self-pay

## 2017-12-02 MED ORDER — PRAVASTATIN SODIUM 40 MG PO TABS: 40.0000 mg | ORAL_TABLET | Freq: Every evening | ORAL | 1 refills | Status: DC

## 2017-12-02 MED ORDER — TELMISARTAN 80 MG PO TABS: 80.0000 mg | ORAL_TABLET | Freq: Every day | ORAL | 1 refills | Status: DC

## 2017-12-04 ENCOUNTER — Other Ambulatory Visit: Payer: Self-pay | Admitting: Internal Medicine

## 2017-12-15 ENCOUNTER — Other Ambulatory Visit: Payer: Self-pay | Admitting: Internal Medicine

## 2018-01-27 ENCOUNTER — Ambulatory Visit: Payer: Medicare Other

## 2018-01-27 ENCOUNTER — Other Ambulatory Visit: Payer: Medicare Other

## 2018-02-01 ENCOUNTER — Other Ambulatory Visit: Payer: Self-pay | Admitting: Internal Medicine

## 2018-02-02 ENCOUNTER — Encounter (INDEPENDENT_AMBULATORY_CARE_PROVIDER_SITE_OTHER): Payer: Medicare Other | Admitting: Ophthalmology

## 2018-02-02 DIAGNOSIS — H34813 Central retinal vein occlusion, bilateral, with macular edema: Secondary | ICD-10-CM | POA: Diagnosis not present

## 2018-02-02 DIAGNOSIS — I10 Essential (primary) hypertension: Secondary | ICD-10-CM

## 2018-02-02 DIAGNOSIS — H43813 Vitreous degeneration, bilateral: Secondary | ICD-10-CM | POA: Diagnosis not present

## 2018-02-02 DIAGNOSIS — H35033 Hypertensive retinopathy, bilateral: Secondary | ICD-10-CM

## 2018-02-18 ENCOUNTER — Encounter: Payer: Self-pay | Admitting: Internal Medicine

## 2018-02-18 ENCOUNTER — Ambulatory Visit (INDEPENDENT_AMBULATORY_CARE_PROVIDER_SITE_OTHER): Payer: Medicare Other | Admitting: Internal Medicine

## 2018-02-18 VITALS — BP 126/82 | HR 66 | Temp 97.7°F | Ht 66.0 in | Wt 290.4 lb

## 2018-02-18 DIAGNOSIS — I129 Hypertensive chronic kidney disease with stage 1 through stage 4 chronic kidney disease, or unspecified chronic kidney disease: Secondary | ICD-10-CM | POA: Diagnosis not present

## 2018-02-18 DIAGNOSIS — Z7982 Long term (current) use of aspirin: Secondary | ICD-10-CM

## 2018-02-18 DIAGNOSIS — E1122 Type 2 diabetes mellitus with diabetic chronic kidney disease: Secondary | ICD-10-CM

## 2018-02-18 DIAGNOSIS — N183 Chronic kidney disease, stage 3 unspecified: Secondary | ICD-10-CM

## 2018-02-18 DIAGNOSIS — M170 Bilateral primary osteoarthritis of knee: Secondary | ICD-10-CM

## 2018-02-18 DIAGNOSIS — Z6841 Body Mass Index (BMI) 40.0 and over, adult: Secondary | ICD-10-CM

## 2018-02-18 DIAGNOSIS — L97512 Non-pressure chronic ulcer of other part of right foot with fat layer exposed: Secondary | ICD-10-CM

## 2018-02-18 NOTE — Patient Instructions (Signed)
Diabetes Mellitus and Foot Care  Foot care is an important part of your health, especially when you have diabetes. Diabetes may cause you to have problems because of poor blood flow (circulation) to your feet and legs, which can cause your skin to:   Become thinner and drier.   Break more easily.   Heal more slowly.   Peel and crack.  You may also have nerve damage (neuropathy) in your legs and feet, causing decreased feeling in them. This means that you may not notice minor injuries to your feet that could lead to more serious problems. Noticing and addressing any potential problems early is the best way to prevent future foot problems.  How to care for your feet  Foot hygiene   Wash your feet daily with warm water and mild soap. Do not use hot water. Then, pat your feet and the areas between your toes until they are completely dry. Do not soak your feet as this can dry your skin.   Trim your toenails straight across. Do not dig under them or around the cuticle. File the edges of your nails with an emery board or nail file.   Apply a moisturizing lotion or petroleum jelly to the skin on your feet and to dry, brittle toenails. Use lotion that does not contain alcohol and is unscented. Do not apply lotion between your toes.  Shoes and socks   Wear clean socks or stockings every day. Make sure they are not too tight. Do not wear knee-high stockings since they may decrease blood flow to your legs.   Wear shoes that fit properly and have enough cushioning. Always look in your shoes before you put them on to be sure there are no objects inside.   To break in new shoes, wear them for just a few hours a day. This prevents injuries on your feet.  Wounds, scrapes, corns, and calluses   Check your feet daily for blisters, cuts, bruises, sores, and redness. If you cannot see the bottom of your feet, use a mirror or ask someone for help.   Do not cut corns or calluses or try to remove them with medicine.   If you  find a minor scrape, cut, or break in the skin on your feet, keep it and the skin around it clean and dry. You may clean these areas with mild soap and water. Do not clean the area with peroxide, alcohol, or iodine.   If you have a wound, scrape, corn, or callus on your foot, look at it several times a day to make sure it is healing and not infected. Check for:  ? Redness, swelling, or pain.  ? Fluid or blood.  ? Warmth.  ? Pus or a bad smell.  General instructions   Do not cross your legs. This may decrease blood flow to your feet.   Do not use heating pads or hot water bottles on your feet. They may burn your skin. If you have lost feeling in your feet or legs, you may not know this is happening until it is too late.   Protect your feet from hot and cold by wearing shoes, such as at the beach or on hot pavement.   Schedule a complete foot exam at least once a year (annually) or more often if you have foot problems. If you have foot problems, report any cuts, sores, or bruises to your health care provider immediately.  Contact a health care provider if:     You have a medical condition that increases your risk of infection and you have any cuts, sores, or bruises on your feet.   You have an injury that is not healing.   You have redness on your legs or feet.   You feel burning or tingling in your legs or feet.   You have pain or cramps in your legs and feet.   Your legs or feet are numb.   Your feet always feel cold.   You have pain around a toenail.  Get help right away if:   You have a wound, scrape, corn, or callus on your foot and:  ? You have pain, swelling, or redness that gets worse.  ? You have fluid or blood coming from the wound, scrape, corn, or callus.  ? Your wound, scrape, corn, or callus feels warm to the touch.  ? You have pus or a bad smell coming from the wound, scrape, corn, or callus.  ? You have a fever.  ? You have a red line going up your leg.  Summary   Check your feet every day  for cuts, sores, red spots, swelling, and blisters.   Moisturize feet and legs daily.   Wear shoes that fit properly and have enough cushioning.   If you have foot problems, report any cuts, sores, or bruises to your health care provider immediately.   Schedule a complete foot exam at least once a year (annually) or more often if you have foot problems.  This information is not intended to replace advice given to you by your health care provider. Make sure you discuss any questions you have with your health care provider.  Document Released: 12/29/1999 Document Revised: 02/12/2017 Document Reviewed: 02/02/2016  Elsevier Interactive Patient Education  2019 [employer]

## 2018-02-19 LAB — CMP14+EGFR
ALK PHOS: 95 IU/L (ref 39–117)
ALT: 14 IU/L (ref 0–32)
AST: 22 IU/L (ref 0–40)
Albumin/Globulin Ratio: 1.6 (ref 1.2–2.2)
Albumin: 4.7 g/dL (ref 3.8–4.8)
BUN/Creatinine Ratio: 40 — ABNORMAL HIGH (ref 12–28)
BUN: 69 mg/dL — AB (ref 8–27)
Bilirubin Total: 0.5 mg/dL (ref 0.0–1.2)
CO2: 18 mmol/L — ABNORMAL LOW (ref 20–29)
CREATININE: 1.74 mg/dL — AB (ref 0.57–1.00)
Calcium: 10.1 mg/dL (ref 8.7–10.3)
Chloride: 110 mmol/L — ABNORMAL HIGH (ref 96–106)
GFR calc Af Amer: 34 mL/min/{1.73_m2} — ABNORMAL LOW (ref >59)
GFR calc non Af Amer: 29 mL/min/{1.73_m2} — ABNORMAL LOW (ref >59)
Globulin, Total: 3 g/dL (ref 1.5–4.5)
Glucose: 76 mg/dL (ref 65–99)
Potassium: 6.2 mmol/L — ABNORMAL HIGH (ref 3.5–5.2)
SODIUM: 146 mmol/L — AB (ref 134–144)
TOTAL PROTEIN: 7.7 g/dL (ref 6.0–8.5)

## 2018-02-19 LAB — HEMOGLOBIN A1C
ESTIMATED AVERAGE GLUCOSE: 94 mg/dL
HEMOGLOBIN A1C: 4.9 % (ref 4.8–5.6)

## 2018-02-21 DIAGNOSIS — I129 Hypertensive chronic kidney disease with stage 1 through stage 4 chronic kidney disease, or unspecified chronic kidney disease: Secondary | ICD-10-CM | POA: Insufficient documentation

## 2018-02-21 DIAGNOSIS — E1122 Type 2 diabetes mellitus with diabetic chronic kidney disease: Secondary | ICD-10-CM | POA: Insufficient documentation

## 2018-02-21 DIAGNOSIS — Z6841 Body Mass Index (BMI) 40.0 and over, adult: Secondary | ICD-10-CM | POA: Insufficient documentation

## 2018-02-21 DIAGNOSIS — N183 Chronic kidney disease, stage 3 unspecified: Secondary | ICD-10-CM | POA: Insufficient documentation

## 2018-02-21 DIAGNOSIS — E119 Type 2 diabetes mellitus without complications: Secondary | ICD-10-CM | POA: Insufficient documentation

## 2018-02-21 DIAGNOSIS — M170 Bilateral primary osteoarthritis of knee: Secondary | ICD-10-CM | POA: Insufficient documentation

## 2018-02-21 NOTE — Progress Notes (Signed)
Subjective:     Patient ID: Mary [name] , female    DOB: 1945/04/24 , 73 y.o.   MRN: [id number]   Chief Complaint  Patient presents with  . Diabetes  . Hypertension    HPI  Diabetes  She presents for her follow-up diabetic visit. She has type 2 diabetes mellitus. There are no hypoglycemic associated symptoms. Pertinent negatives for diabetes include no blurred vision and no chest pain. There are no hypoglycemic complications. Risk factors for coronary artery disease include diabetes mellitus, dyslipidemia, post-menopausal, sedentary lifestyle, hypertension and obesity.  Hypertension  This is a chronic problem. The current episode started more than 1 year ago. The problem has been gradually improving since onset. Pertinent negatives include no blurred vision, chest pain, palpitations or shortness of breath.   She reports compliance with meds.   Past Medical History:  Diagnosis Date  . Arthritis    osteoarthritis  . Diabetes mellitus    Insulin dependent  . H/O cardiovascular stress test    pt. reports 10/13- was normal per pt.   . Hyperlipemia   . Hypertension      Family History  Problem Relation Age of Onset  . Hypertension Mother   . Heart Problems Father   . Gout Father   . Arthritis Father      Current Outpatient Medications:  .  aspirin EC 81 MG tablet, Take 81 mg by mouth daily., Disp: , Rfl:  .  cetirizine (ZYRTEC) 10 MG tablet, Take 10 mg by mouth daily., Disp: , Rfl:  .  fluticasone (FLONASE) 50 MCG/ACT nasal spray, Place 2 sprays into the nose daily. (Patient taking differently: Place 2 sprays into the nose daily as needed for allergies. ), Disp: 16 g, Rfl: 0 .  furosemide (LASIX) 40 MG tablet, Take 40 mg by mouth daily., Disp: , Rfl:  .  KLOR-CON M20 20 MEQ tablet, Take 20 mEq by mouth daily. with food, Disp: , Rfl: 1 .  levothyroxine (SYNTHROID, LEVOTHROID) 112 MCG tablet, Take 112 mcg by mouth daily before breakfast. , Disp: , Rfl:  .  metoprolol  succinate (TOPROL-XL) 50 MG 24 hr tablet, Take 50 mg by mouth daily., Disp: , Rfl: 6 .  omeprazole (PRILOSEC) 20 MG capsule, Take 1 capsule (20 mg total) by mouth daily., Disp: 90 capsule, Rfl: 1 .  oxyCODONE-acetaminophen (PERCOCET) 7.5-325 MG tablet, Take 1 tablet by mouth every 4 (four) hours as needed. pain, Disp: 30 tablet, Rfl: 0 .  pravastatin (PRAVACHOL) 40 MG tablet, TAKE 1 TABLET BY MOUTH EVERY DAY IN THE EVENING, Disp: 30 tablet, Rfl: 1 .  telmisartan (MICARDIS) 80 MG tablet, TAKE 1 TABLET BY MOUTH ONCE A DAY., Disp: 90 tablet, Rfl: 0 .  ACCU-CHEK AVIVA PLUS test strip, See admin instructions. for testing, Disp: , Rfl: 11 .  OZEMPIC, 0.25 OR 0.5 MG/DOSE, 2 MG/1.5ML SOPN, INJECT 0.5 MG INTO SKIN EVERY WEEK ON SAME DAY EACH WEEK IN ABDOMEN, THIGHS, OR UPPER ARM, Disp: , Rfl: 1   No Known Allergies   Review of Systems  Constitutional: Negative.   Eyes: Negative for blurred vision.  Respiratory: Negative.  Negative for shortness of breath.   Cardiovascular: Negative.  Negative for chest pain and palpitations.  Gastrointestinal: Negative.   Musculoskeletal:       She c/o knee pain.  There is pain with ambulation.   Neurological: Negative.   Psychiatric/Behavioral: Negative.      Today's Vitals   02/18/18 1050  BP: 126/82  Pulse: 66  Temp: 97.7 F (36.5 C)  TempSrc: Oral  Weight: 290 lb 6.4 oz (131.7 kg)  Height: _0  (1.676 m)  PainSc: 7    Body mass index is 46.87 kg/m.   Objective:  Physical Exam Vitals signs and nursing note reviewed.  Constitutional:      Appearance: Normal appearance. She is obese.  HENT:     Head: Normocephalic and atraumatic.  Cardiovascular:     Rate and Rhythm: Normal rate and regular rhythm.     Heart sounds: Normal heart sounds.  Pulmonary:     Effort: Pulmonary effort is normal.     Breath sounds: Normal breath sounds.  Skin:    General: Skin is warm.  Neurological:     General: No focal deficit present.     Mental Status: She  is alert.  Psychiatric:        Mood and Affect: Mood normal.         Assessment And Plan:     1. Diabetes mellitus with stage 3 chronic kidney disease ([place])  I will check labs as listed below.  She has successfully been weaned off of insulin. She will continue with GLP-1 agent.   - Ambulatory referral to Podiatry - CMP14+EGFR - Hemoglobin A1c  2. Parenchymal renal hypertension, stage 1 through stage 4 or unspecified chronic kidney disease  Controlled. She will continue with current meds. She is encouraged to avoid adding salt to her foods.    3. Skin ulcer of right foot with fat layer exposed ([place])  As per her problem list.  She declined foot exam. I will refer her to podiatry.   - Ambulatory referral to Podiatry  4. Primary osteoarthritis of both knees  Chronic. I will refer her to Ortho for further evaluation. She is encouraged to use topical pain cream over OTC NSAIDS.   - Referral to Chronic Care Management Services - Ambulatory referral to Orthopedic Surgery  5. Class 3 severe obesity due to excess calories with serious comorbidity and body mass index (BMI) of 45.0 to 49.9 in adult [facility])  She is encouraged to strive for BMI less than 40 to decrease cardiac risk. She is advised to start doing chair exercises while watching tv. She is encouraged to perform both upper and lower extremity exercises. She will start with 5 minutes and build up gradually to performing exercises during all commercials of a 1-hr tv show.   Maximino Greenland, MD

## 2018-02-23 ENCOUNTER — Ambulatory Visit: Payer: Self-pay

## 2018-02-23 DIAGNOSIS — Z6841 Body Mass Index (BMI) 40.0 and over, adult: Secondary | ICD-10-CM

## 2018-02-23 DIAGNOSIS — M170 Bilateral primary osteoarthritis of knee: Secondary | ICD-10-CM

## 2018-02-23 NOTE — Chronic Care Management (AMB) (Signed)
  Care Management Note   Sahira Cataldi is a 73 y.o. year old female who sees Glendale Chard, MD for primary care. Dr. Baird Cancer asked the CCM team to consult the patient for assistance with chronic disease management related to Primary Osteoarthritis and Class 3 severe Obesity (BMI of 45.0-49.9). Referral was placed.   Review of patient status, including review of consultants reports, relevant laboratory and other test results, and collaboration with appropriate care team members and the patient's provider was performed as part of comprehensive patient evaluation and provision of chronic care management services. Telephone outreach to patient today to introduce CCM services.   I reached out to Mathews Robinsons by phone today. Ms. Kackley declines needing CCM services at this time. She states, "I am doing pretty good". She is agreeable to notifying Dr. Baird Cancer if services are needed in the future.  In addition, I advised Ms. Martell of her abnormal lab values from 02/18/18 and provided her with MD recommendations per Dr. Baird Cancer. (Please see in basket message sent).   Ms. Ridgeway was given information about Chronic Care Management services today including:  1. CCM service includes personalized support from designated clinical staff supervised by her physician, including individualized plan of care and coordination with other care providers 2. 24/7 contact phone numbers for assistance for urgent and routine care needs. 3. Service will only be billed when office clinical staff spend 20 minutes or more in a month to coordinate care. 4. Only one practitioner may furnish and bill the service in a calendar month. 5. The patient may stop CCM services at any time (effective at the end of the month) by phone call to the office staff. 6. The patient will be responsible for cost sharing (co-pay) of up to 20% of the service fee (after annual deductible is met).  Patient did not agree to services and does not wish  to consider at this time.   Follow Up Plan: No further CCM outreach scheduled. Patient declines services at this time, she will consider in the future if needed.   Barb Merino, RN,CCM Care Management Coordinator [facility]  Direct Phone: [phone]

## 2018-02-23 NOTE — Patient Instructions (Signed)
Visit Information  Ms. Guerrieri was given information about Chronic Care Management services today including:  1. CCM service includes personalized support from designated clinical staff supervised by her physician, including individualized plan of care and coordination with other care providers 2. 24/7 contact phone numbers for assistance for urgent and routine care needs. 3. Service will only be billed when office clinical staff spend 20 minutes or more in a month to coordinate care. 4. Only one practitioner may furnish and bill the service in a calendar month. 5. The patient may stop CCM services at any time (effective at the end of the month) by phone call to the office staff. 6. The patient will be responsible for cost sharing (co-pay) of up to 20% of the service fee (after annual deductible is met).  Patient did not agree to services and does not wish to consider at this time.   Follow Up Plan: No further CCM outreach scheduled. Patient declines services at this time, she will consider in the future if needed.   The patient verbalized understanding of instructions provided today and declined a print copy of patient instruction materials.   Barb Merino, RN,CCM Care Management Coordinator [facility]  Direct Phone: [phone]

## 2018-02-24 ENCOUNTER — Other Ambulatory Visit: Payer: Self-pay | Admitting: Internal Medicine

## 2018-02-24 ENCOUNTER — Telehealth: Payer: Self-pay

## 2018-02-24 ENCOUNTER — Other Ambulatory Visit: Payer: Self-pay

## 2018-02-24 MED ORDER — OZEMPIC (0.25 OR 0.5 MG/DOSE) 2 MG/1.5ML ~~LOC~~ SOPN: 0.5000 mg | PEN_INJECTOR | SUBCUTANEOUS | 1 refills | Status: DC

## 2018-02-24 MED ORDER — MAGNESIUM 400 MG PO CAPS: 400.0000 mg | ORAL_CAPSULE | Freq: Every evening | ORAL | 1 refills | Status: DC

## 2018-02-24 NOTE — Telephone Encounter (Signed)
Pt notified that Dr. Allyne Gee said that she wasn't told to stop the ozempic that the pt needed to stay on the medication because it will help with her weight.  The pt said that she thought that since her sugars were better that she was told that she could stop it.

## 2018-03-02 ENCOUNTER — Encounter (INDEPENDENT_AMBULATORY_CARE_PROVIDER_SITE_OTHER): Payer: Medicare Other | Admitting: Ophthalmology

## 2018-03-02 DIAGNOSIS — I10 Essential (primary) hypertension: Secondary | ICD-10-CM

## 2018-03-02 DIAGNOSIS — H35033 Hypertensive retinopathy, bilateral: Secondary | ICD-10-CM | POA: Diagnosis not present

## 2018-03-02 DIAGNOSIS — H43813 Vitreous degeneration, bilateral: Secondary | ICD-10-CM

## 2018-03-02 DIAGNOSIS — H34813 Central retinal vein occlusion, bilateral, with macular edema: Secondary | ICD-10-CM

## 2018-03-03 ENCOUNTER — Other Ambulatory Visit: Payer: Self-pay | Admitting: Internal Medicine

## 2018-03-03 ENCOUNTER — Other Ambulatory Visit: Payer: Self-pay

## 2018-03-03 MED ORDER — OZEMPIC (0.25 OR 0.5 MG/DOSE) 2 MG/1.5ML ~~LOC~~ SOPN: 0.5000 mg | PEN_INJECTOR | SUBCUTANEOUS | 2 refills | Status: DC

## 2018-03-05 ENCOUNTER — Other Ambulatory Visit: Payer: Self-pay

## 2018-03-10 ENCOUNTER — Other Ambulatory Visit: Payer: Self-pay

## 2018-03-10 ENCOUNTER — Ambulatory Visit: Payer: Medicare Other | Admitting: Sports Medicine

## 2018-03-10 MED ORDER — ACCU-CHEK AVIVA PLUS W/DEVICE KIT: PACK | 1 refills | Status: DC

## 2018-03-17 ENCOUNTER — Ambulatory Visit
Admission: RE | Admit: 2018-03-17 | Discharge: 2018-03-17 | Disposition: A | Discharge status: Home / Self Care | Payer: Medicare Other | Source: Ambulatory Visit | Attending: Internal Medicine | Admitting: Internal Medicine

## 2018-03-17 DIAGNOSIS — E2839 Other primary ovarian failure: Secondary | ICD-10-CM

## 2018-03-17 DIAGNOSIS — Z1231 Encounter for screening mammogram for malignant neoplasm of breast: Secondary | ICD-10-CM

## 2018-03-24 ENCOUNTER — Encounter: Payer: Self-pay | Admitting: Sports Medicine

## 2018-03-24 ENCOUNTER — Ambulatory Visit (INDEPENDENT_AMBULATORY_CARE_PROVIDER_SITE_OTHER): Payer: Medicare Other | Admitting: Sports Medicine

## 2018-03-24 DIAGNOSIS — B351 Tinea unguium: Secondary | ICD-10-CM

## 2018-03-24 DIAGNOSIS — M2142 Flat foot [pes planus] (acquired), left foot: Secondary | ICD-10-CM

## 2018-03-24 DIAGNOSIS — N183 Chronic kidney disease, stage 3 unspecified: Secondary | ICD-10-CM

## 2018-03-24 DIAGNOSIS — E1142 Type 2 diabetes mellitus with diabetic polyneuropathy: Secondary | ICD-10-CM | POA: Diagnosis not present

## 2018-03-24 DIAGNOSIS — L97512 Non-pressure chronic ulcer of other part of right foot with fat layer exposed: Secondary | ICD-10-CM

## 2018-03-24 DIAGNOSIS — E1122 Type 2 diabetes mellitus with diabetic chronic kidney disease: Secondary | ICD-10-CM

## 2018-03-24 DIAGNOSIS — M21619 Bunion of unspecified foot: Secondary | ICD-10-CM

## 2018-03-24 DIAGNOSIS — M2040 Other hammer toe(s) (acquired), unspecified foot: Secondary | ICD-10-CM

## 2018-03-24 DIAGNOSIS — M79609 Pain in unspecified limb: Secondary | ICD-10-CM

## 2018-03-24 DIAGNOSIS — L84 Corns and callosities: Secondary | ICD-10-CM

## 2018-03-24 DIAGNOSIS — M2141 Flat foot [pes planus] (acquired), right foot: Secondary | ICD-10-CM

## 2018-03-24 NOTE — Progress Notes (Signed)
Subjective: Mary [name] is a 73 y.o. female patient seen in office for evaluation of ulceration of the Right. Patient has a history of diabetes and a blood glucose level today of 102 mg/dl.  Patient is not doing anything. Reports that she is ashamed of her feet and her thick nail and calluses. Reports that the last doctor did not take much time to care for her and wanted to give me a try per her PCP. Denies nausea/fever/vomiting/chills/night sweats/shortness of breath/pain. Patient has no other pedal complaints at this time.  Patient Active Problem List   Diagnosis Date Noted  . Diabetes mellitus with stage 3 chronic kidney disease ([place]) 02/21/2018  . 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 Primary osteoarthritis of both knees 02/21/2018  . Class 3 severe obesity due to excess calories with serious comorbidity and body mass index (BMI) of 45.0 to 49.9 in adult ([place]) 02/21/2018  . Skin ulcer of right foot with fat layer exposed ([place]) 02/18/2018  . Morbid (severe) obesity due to excess calories ([place]) 11/18/2017  . Knee pain 07/16/2015  . Lateral dislocation of right patella 05/04/2015  . Rupture of right quadriceps tendon 05/24/2014  . S/P TKR (total knee replacement) 05/05/2012   Current Outpatient Medications on File Prior to Visit  Medication Sig Dispense Refill  . ACCU-CHEK AVIVA PLUS test strip See admin instructions. for testing  11  . aspirin EC 81 MG tablet Take 81 mg by mouth daily.    . Blood Glucose Monitoring Suppl (ACCU-CHEK AVIVA PLUS) w/Device KIT Use as directed to check blood sugars 2 times per day dx: e11.65 1 kit 1  . cetirizine (ZYRTEC) 10 MG tablet Take 10 mg by mouth daily.    . fluticasone (FLONASE) 50 MCG/ACT nasal spray Place 2 sprays into the nose daily. (Patient taking differently: Place 2 sprays into the nose daily as needed for allergies. ) 16 g 0  . furosemide (LASIX) 40 MG tablet Take 40 mg by mouth daily.    [name] KLOR-CON M20 20 MEQ tablet Take 20 mEq by  mouth daily. with food  1  . levothyroxine (SYNTHROID, LEVOTHROID) 112 MCG tablet Take 112 mcg by mouth daily before breakfast.     . Magnesium 400 MG CAPS Take 400 mg by mouth every evening. 30 capsule 1  . metoprolol succinate (TOPROL-XL) 50 MG 24 hr tablet Take 50 mg by mouth daily.  6  . omeprazole (PRILOSEC) 20 MG capsule Take 1 capsule (20 mg total) by mouth daily. 90 capsule 1  . oxyCODONE-acetaminophen (PERCOCET) 7.5-325 MG tablet Take 1 tablet by mouth every 4 (four) hours as needed. pain 30 tablet 0  . OZEMPIC, 0.25 OR 0.5 MG/DOSE, 2 MG/1.5ML SOPN Inject 0.5 mg into the skin once a week. 3 pen 2  . pravastatin (PRAVACHOL) 40 MG tablet TAKE 1 TABLET BY MOUTH EVERY DAY IN THE EVENING 30 tablet 1  . telmisartan (MICARDIS) 80 MG tablet TAKE 1 TABLET BY MOUTH ONCE A DAY. 90 tablet 0   No current facility-administered medications on file prior to visit.    No Known Allergies  Recent Results (from the past 2160 hour(s))  CMP14+EGFR     Status: Abnormal   Collection Time: 02/18/18 12:11 PM  Result Value Ref Range   Glucose 76 65 - 99 mg/dL   BUN 69 (H) 8 - 27 mg/dL   Creatinine, Ser 1.74 (H) 0.57 - 1.00 mg/dL   GFR calc non Af Amer 29 (L) >59 mL/min/1.73   GFR calc  Af Amer 34 (L) >59 mL/min/1.73   BUN/Creatinine Ratio 40 (H) 12 - 28   Sodium 146 (H) 134 - 144 mmol/L   Potassium 6.2 (H) 3.5 - 5.2 mmol/L   Chloride 110 (H) 96 - 106 mmol/L   CO2 18 (L) 20 - 29 mmol/L   Calcium 10.1 8.7 - 10.3 mg/dL   Total Protein 7.7 6.0 - 8.5 g/dL   Albumin 4.7 3.8 - 4.8 g/dL    Comment:               **Please note reference interval change**   Globulin, Total 3.0 1.5 - 4.5 g/dL   Albumin/Globulin Ratio 1.6 1.2 - 2.2   Bilirubin Total 0.5 0.0 - 1.2 mg/dL   Alkaline Phosphatase 95 39 - 117 IU/L   AST 22 0 - 40 IU/L   ALT 14 0 - 32 IU/L  Hemoglobin A1c     Status: None   Collection Time: 02/18/18 12:11 PM  Result Value Ref Range   Hgb A1c MFr Bld 4.9 4.8 - 5.6 %    Comment:           Prediabetes: 5.7 - 6.4          Diabetes: >6.4          Glycemic control for adults with diabetes: <7.0    Est. average glucose Bld gHb Est-mCnc 94 mg/dL    Objective: There were no vitals filed for this visit.  General: Patient is awake, alert, oriented x 3 and in no acute distress.  Dermatology: Skin is warm and dry bilateral with a full thickness ulceration present  Right sub met 1. Ulceration measures 0.3 cm x 0.2 cm x 0.2cm. There is a  keratotic border with a granular base. The ulceration does not  probe to bone. There is no malodor, no active drainage, no erythema, no edema. No acute signs of infection.  Callus sub met 1 on left and Bilateral hallux and sub met 5 on left with no signs of infection  Nails x 10 severely elonagted and thickened consistent with oncyhomycosis   Vascular: Dorsalis Pedis pulse = 1/4 Bilateral,  Posterior Tibial pulse = 0/4 Bilateral,  Capillary Fill Time < 5 seconds, Lymphedema bilateral  Neurologic: Protective absent bilateraal  Musculosketal: There is decreased ankle joint range of motion Bilateral. There is decreased Subtalar joint range of motion Bilateral. There is a decrease in 1st metatarsophalangeal joint range of motion Bilateral. No Pain with palpation to ulcerated area. No pain with compression to calves bilateral. + Bunion, hammertoe, pes planus bilateral.  No results for input(s): GRAMSTAIN, LABORGA in the last 8760 hours.  Assessment and Plan:  Problem List Items Addressed This Visit      Musculoskeletal and Integument   Skin ulcer of right foot with fat layer exposed ([place]) - Primary    Other Visit Diagnoses    Callus       Diabetic polyneuropathy associated with type 2 diabetes mellitus ([place])       Pain due to onychomycosis of nail       Bunion       Hammer toe, unspecified laterality       Pes planus of both feet         -Examined patient and discussed the progression of the wound and treatment alternatives. - Mechanically  debrided all nails x 10 using sterile nail nipper without incident and at no charge parred callus x 3 using sterile chisel blade without incident -Safe step  diabetic shoe order form was completed; office to contact primary care for approval/certification;  Office to arrange shoe fitting and dispensing. - Excisionally dedbrided ulceration at sub met 1 on right to healthy bleeding borders removing nonviable tissue using a sterile chisel blade. Wound measures post debridement as above. Wound was debrided to the level of the dermis with viable wound base exposed to promote healing. Hemostasis was achieved with manuel pressure. Patient tolerated procedure well without any discomfort or anesthesia necessary for this wound debridement.  -Applied betadine and dry sterile dressing and instructed patient to continue with daily dressings at home consisting of same and applied offloading pads in shoes  - Advised patient to go to the ER or return to office if the wound worsens or if constitutional symptoms are present. -Patient to return to office in 3 weeks for follow up care and evaluation or sooner if problems arise.  Landis Martins, DPM

## 2018-03-26 ENCOUNTER — Other Ambulatory Visit: Payer: Self-pay

## 2018-03-26 MED ORDER — PRAVASTATIN SODIUM 40 MG PO TABS: ORAL_TABLET | ORAL | 1 refills | Status: DC

## 2018-04-02 ENCOUNTER — Other Ambulatory Visit: Payer: Self-pay | Admitting: Internal Medicine

## 2018-04-08 ENCOUNTER — Other Ambulatory Visit: Payer: Medicare Other | Admitting: Orthotics

## 2018-04-13 ENCOUNTER — Other Ambulatory Visit: Payer: Self-pay

## 2018-04-13 ENCOUNTER — Encounter (INDEPENDENT_AMBULATORY_CARE_PROVIDER_SITE_OTHER): Payer: Medicare Other | Admitting: Ophthalmology

## 2018-04-13 DIAGNOSIS — H34813 Central retinal vein occlusion, bilateral, with macular edema: Secondary | ICD-10-CM | POA: Diagnosis not present

## 2018-04-13 DIAGNOSIS — H43813 Vitreous degeneration, bilateral: Secondary | ICD-10-CM | POA: Diagnosis not present

## 2018-04-13 DIAGNOSIS — I10 Essential (primary) hypertension: Secondary | ICD-10-CM | POA: Diagnosis not present

## 2018-04-13 DIAGNOSIS — H35033 Hypertensive retinopathy, bilateral: Secondary | ICD-10-CM

## 2018-04-13 DIAGNOSIS — H2513 Age-related nuclear cataract, bilateral: Secondary | ICD-10-CM

## 2018-04-14 ENCOUNTER — Ambulatory Visit: Payer: Medicare Other | Admitting: Orthotics

## 2018-04-14 ENCOUNTER — Encounter: Payer: Self-pay | Admitting: Sports Medicine

## 2018-04-14 ENCOUNTER — Ambulatory Visit (INDEPENDENT_AMBULATORY_CARE_PROVIDER_SITE_OTHER): Payer: Medicare Other | Admitting: Sports Medicine

## 2018-04-14 VITALS — Temp 97.4°F

## 2018-04-14 DIAGNOSIS — E1142 Type 2 diabetes mellitus with diabetic polyneuropathy: Secondary | ICD-10-CM

## 2018-04-14 DIAGNOSIS — M2142 Flat foot [pes planus] (acquired), left foot: Secondary | ICD-10-CM

## 2018-04-14 DIAGNOSIS — L97512 Non-pressure chronic ulcer of other part of right foot with fat layer exposed: Secondary | ICD-10-CM

## 2018-04-14 DIAGNOSIS — M2141 Flat foot [pes planus] (acquired), right foot: Secondary | ICD-10-CM

## 2018-04-14 DIAGNOSIS — M2040 Other hammer toe(s) (acquired), unspecified foot: Secondary | ICD-10-CM

## 2018-04-14 DIAGNOSIS — L84 Corns and callosities: Secondary | ICD-10-CM

## 2018-04-14 NOTE — Progress Notes (Signed)
Subjective: Mary [name] is a 73 y.o. female patient seen in office for evaluation of ulceration of the Right. Patient reports that her feet seem to be doing better. Reports that there has not been any drainage from her right foot and has been putting Betadine spray to area . Denies nausea/fever/vomiting/chills/night sweats/shortness of breath/pain. Patient has no other pedal complaints at this time.  Patient is also her for diabetic shoe measurements.   Patient Active Problem List   Diagnosis Date Noted  . Diabetes mellitus with stage 3 chronic kidney disease ([place]) 02/21/2018  . 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 Primary osteoarthritis of both knees 02/21/2018  . Class 3 severe obesity due to excess calories with serious comorbidity and body mass index (BMI) of 45.0 to 49.9 in adult ([place]) 02/21/2018  . Skin ulcer of right foot with fat layer exposed ([place]) 02/18/2018  . Morbid (severe) obesity due to excess calories ([place]) 11/18/2017  . Knee pain 07/16/2015  . Lateral dislocation of right patella 05/04/2015  . Rupture of right quadriceps tendon 05/24/2014  . S/P TKR (total knee replacement) 05/05/2012   Current Outpatient Medications on File Prior to Visit  Medication Sig Dispense Refill  . ACCU-CHEK AVIVA PLUS test strip See admin instructions. for testing  11  . aspirin EC 81 MG tablet Take 81 mg by mouth daily.    . Blood Glucose Monitoring Suppl (ACCU-CHEK AVIVA PLUS) w/Device KIT Use as directed to check blood sugars 2 times per day dx: e11.65 1 kit 1  . cetirizine (ZYRTEC) 10 MG tablet Take 10 mg by mouth daily.    . fluticasone (FLONASE) 50 MCG/ACT nasal spray Place 2 sprays into the nose daily. (Patient taking differently: Place 2 sprays into the nose daily as needed for allergies. ) 16 g 0  . furosemide (LASIX) 40 MG tablet Take 40 mg by mouth daily.    [name] KLOR-CON M20 20 MEQ tablet Take 20 mEq by mouth daily. with food  1  . levothyroxine (SYNTHROID, LEVOTHROID) 112 MCG  tablet Take 112 mcg by mouth daily before breakfast.     . Magnesium Oxide -Mg Supplement 400 MG CAPS TAKE 400 MG BY MOUTH EVERY EVENING. 30 capsule 1  . metoprolol succinate (TOPROL-XL) 50 MG 24 hr tablet Take 50 mg by mouth daily.  6  . omeprazole (PRILOSEC) 20 MG capsule Take 1 capsule (20 mg total) by mouth daily. 90 capsule 1  . oxyCODONE-acetaminophen (PERCOCET) 7.5-325 MG tablet Take 1 tablet by mouth every 4 (four) hours as needed. pain 30 tablet 0  . OZEMPIC, 0.25 OR 0.5 MG/DOSE, 2 MG/1.5ML SOPN Inject 0.5 mg into the skin once a week. 3 pen 2  . pravastatin (PRAVACHOL) 40 MG tablet TAKE 1 TABLET BY MOUTH EVERY DAY IN THE EVENING 90 tablet 1  . telmisartan (MICARDIS) 80 MG tablet TAKE 1 TABLET BY MOUTH ONCE A DAY. 90 tablet 0   No current facility-administered medications on file prior to visit.    No Known Allergies  Recent Results (from the past 2160 hour(s))  CMP14+EGFR     Status: Abnormal   Collection Time: 02/18/18 12:11 PM  Result Value Ref Range   Glucose 76 65 - 99 mg/dL   BUN 69 (H) 8 - 27 mg/dL   Creatinine, Ser 1.74 (H) 0.57 - 1.00 mg/dL   GFR calc non Af Amer 29 (L) >59 mL/min/1.73   GFR calc Af Amer 34 (L) >59 mL/min/1.73   BUN/Creatinine Ratio 40 (H) 12 - 28  Sodium 146 (H) 134 - 144 mmol/L   Potassium 6.2 (H) 3.5 - 5.2 mmol/L   Chloride 110 (H) 96 - 106 mmol/L   CO2 18 (L) 20 - 29 mmol/L   Calcium 10.1 8.7 - 10.3 mg/dL   Total Protein 7.7 6.0 - 8.5 g/dL   Albumin 4.7 3.8 - 4.8 g/dL    Comment:               **Please note reference interval change**   Globulin, Total 3.0 1.5 - 4.5 g/dL   Albumin/Globulin Ratio 1.6 1.2 - 2.2   Bilirubin Total 0.5 0.0 - 1.2 mg/dL   Alkaline Phosphatase 95 39 - 117 IU/L   AST 22 0 - 40 IU/L   ALT 14 0 - 32 IU/L  Hemoglobin A1c     Status: None   Collection Time: 02/18/18 12:11 PM  Result Value Ref Range   Hgb A1c MFr Bld 4.9 4.8 - 5.6 %    Comment:          Prediabetes: 5.7 - 6.4          Diabetes: >6.4           Glycemic control for adults with diabetes: <7.0    Est. average glucose Bld gHb Est-mCnc 94 mg/dL    Objective: There were no vitals filed for this visit.  General: Patient is awake, alert, oriented x 3 and in no acute distress.  Dermatology: Skin is warm and dry bilateral with a full thickness ulceration present  Right sub met 1. Ulceration measures 0.4 cm x 0.2 cm x 0.2cm. There is a  keratotic border with a granular base. The ulceration does not  probe to bone. There is no malodor, no active drainage, no erythema, no edema. No acute signs of infection.  Callus sub met 1 on left and Bilateral hallux and sub met 5 on left with no signs of infection  Nails x 10 short and thickened consistent with oncyhomycosis   Vascular: Dorsalis Pedis pulse = 1/4 Bilateral,  Posterior Tibial pulse = 0/4 Bilateral,  Capillary Fill Time < 5 seconds, Lymphedema bilateral  Neurologic: Protective absent bilateral  Musculosketal: No Pain with palpation to ulcerated area. No pain with compression to calves bilateral. + Bunion, hammertoe, pes planus bilateral.  No results for input(s): GRAMSTAIN, LABORGA in the last 8760 hours.  Assessment and Plan:  Problem List Items Addressed This Visit      Musculoskeletal and Integument   Skin ulcer of right foot with fat layer exposed ([place]) - Primary    Other Visit Diagnoses    Diabetic polyneuropathy associated with type 2 diabetes mellitus ([place])         -Examined patient and discussed the progression of the wound and treatment alternatives. - Excisionally dedbrided ulceration at sub met 1 on right to healthy bleeding borders removing nonviable tissue using a sterile chisel blade. Wound measures post debridement as above. Wound was debrided to the level of the dermis with viable wound base exposed to promote healing. Hemostasis was achieved with manuel pressure. Patient tolerated procedure well without any discomfort or anesthesia necessary for this wound  debridement.  -Applied Iodosorb and dry sterile dressing and instructed patient to continue with daily dressings at home consisting of  Betadine and applied offloading pads in shoes again this visit -Gave samples of foot miracle cream and if she likes it can purchase -Patient was measured for diabetic shoes with Liliane Channel at today's visit  - Advised patient to  go to the ER or return to office if the wound worsens or if constitutional symptoms are present. -Patient to return to office in 3-4 weeks for follow up wound care and dispensing shoes  or sooner if problems arise.  Landis Martins, DPM

## 2018-04-14 NOTE — Progress Notes (Signed)

## 2018-04-28 ENCOUNTER — Other Ambulatory Visit: Payer: Self-pay | Admitting: Internal Medicine

## 2018-04-30 ENCOUNTER — Other Ambulatory Visit: Payer: Self-pay | Admitting: Internal Medicine

## 2018-05-07 ENCOUNTER — Other Ambulatory Visit: Payer: Self-pay | Admitting: Internal Medicine

## 2018-05-12 ENCOUNTER — Encounter: Payer: Self-pay | Admitting: Sports Medicine

## 2018-05-12 ENCOUNTER — Ambulatory Visit (INDEPENDENT_AMBULATORY_CARE_PROVIDER_SITE_OTHER): Payer: Medicare Other | Admitting: Sports Medicine

## 2018-05-12 ENCOUNTER — Other Ambulatory Visit: Payer: Self-pay

## 2018-05-12 ENCOUNTER — Ambulatory Visit (INDEPENDENT_AMBULATORY_CARE_PROVIDER_SITE_OTHER): Payer: Medicare Other | Admitting: Orthotics

## 2018-05-12 VITALS — Temp 97.7°F

## 2018-05-12 DIAGNOSIS — M2142 Flat foot [pes planus] (acquired), left foot: Secondary | ICD-10-CM | POA: Diagnosis not present

## 2018-05-12 DIAGNOSIS — M2141 Flat foot [pes planus] (acquired), right foot: Secondary | ICD-10-CM

## 2018-05-12 DIAGNOSIS — E1122 Type 2 diabetes mellitus with diabetic chronic kidney disease: Secondary | ICD-10-CM

## 2018-05-12 DIAGNOSIS — N183 Chronic kidney disease, stage 3 unspecified: Secondary | ICD-10-CM

## 2018-05-12 DIAGNOSIS — L84 Corns and callosities: Secondary | ICD-10-CM

## 2018-05-12 DIAGNOSIS — E1142 Type 2 diabetes mellitus with diabetic polyneuropathy: Secondary | ICD-10-CM

## 2018-05-12 DIAGNOSIS — M2040 Other hammer toe(s) (acquired), unspecified foot: Secondary | ICD-10-CM

## 2018-05-12 DIAGNOSIS — M21619 Bunion of unspecified foot: Secondary | ICD-10-CM

## 2018-05-12 DIAGNOSIS — L97512 Non-pressure chronic ulcer of other part of right foot with fat layer exposed: Secondary | ICD-10-CM | POA: Diagnosis not present

## 2018-05-12 NOTE — Progress Notes (Signed)

## 2018-05-12 NOTE — Progress Notes (Signed)
Subjective: Mary [name] is a 73 y.o. female patient seen in office for evaluation of ulceration of the Right foot. Patient reports that she has been doing dressings the best she could with betadine and taking care of it. Reports no drainage from her right foot. Denies nausea/fever/vomiting/chills/night sweats/shortness of breath/pain. Patient has no other pedal complaints at this time.  Patient is also here for pick up of diabetic shoes with Liliane Channel.  Patient Active Problem List   Diagnosis Date Noted  . Diabetes mellitus with stage 3 chronic kidney disease ([place]) 02/21/2018  . 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 Primary osteoarthritis of both knees 02/21/2018  . Class 3 severe obesity due to excess calories with serious comorbidity and body mass index (BMI) of 45.0 to 49.9 in adult ([place]) 02/21/2018  . Skin ulcer of right foot with fat layer exposed ([place]) 02/18/2018  . Morbid (severe) obesity due to excess calories ([place]) 11/18/2017  . Knee pain 07/16/2015  . Lateral dislocation of right patella 05/04/2015  . Rupture of right quadriceps tendon 05/24/2014  . S/P TKR (total knee replacement) 05/05/2012   Current Outpatient Medications on File Prior to Visit  Medication Sig Dispense Refill  . ACCU-CHEK AVIVA PLUS test strip See admin instructions. for testing  11  . aspirin EC 81 MG tablet Take 81 mg by mouth daily.    [name] BESIVANCE 0.6 % SUSP     . Blood Glucose Monitoring Suppl (ACCU-CHEK AVIVA PLUS) w/Device KIT Use as directed to check blood sugars 2 times per day dx: e11.65 1 kit 1  . cetirizine (ZYRTEC) 10 MG tablet Take 10 mg by mouth daily.    . fluticasone (FLONASE) 50 MCG/ACT nasal spray Place 2 sprays into the nose daily. (Patient taking differently: Place 2 sprays into the nose daily as needed for allergies. ) 16 g 0  . furosemide (LASIX) 40 MG tablet Take 40 mg by mouth daily.    [name] KLOR-CON M20 20 MEQ tablet Take 20 mEq by mouth daily. with food  1  . levothyroxine  (SYNTHROID, LEVOTHROID) 112 MCG tablet Take 112 mcg by mouth daily before breakfast.     . Magnesium Oxide -Mg Supplement 400 MG CAPS TAKE 1 CAPSULE BY MOUTH EVERY EVENING 90 capsule 1  . metoprolol succinate (TOPROL-XL) 50 MG 24 hr tablet Take 50 mg by mouth daily.  6  . omeprazole (PRILOSEC) 20 MG capsule TAKE 1 CAPSULE BY MOUTH  DAILY 90 capsule 1  . oxyCODONE-acetaminophen (PERCOCET) 7.5-325 MG tablet Take 1 tablet by mouth every 4 (four) hours as needed. pain 30 tablet 0  . OZEMPIC, 0.25 OR 0.5 MG/DOSE, 2 MG/1.5ML SOPN Inject 0.5 mg into the skin once a week. 3 pen 2  . pravastatin (PRAVACHOL) 40 MG tablet TAKE 1 TABLET BY MOUTH EVERY DAY IN THE EVENING 90 tablet 1  . telmisartan (MICARDIS) 80 MG tablet TAKE 1 TABLET BY MOUTH ONCE A DAY. 90 tablet 0   No current facility-administered medications on file prior to visit.    No Known Allergies  Recent Results (from the past 2160 hour(s))  CMP14+EGFR     Status: Abnormal   Collection Time: 02/18/18 12:11 PM  Result Value Ref Range   Glucose 76 65 - 99 mg/dL   BUN 69 (H) 8 - 27 mg/dL   Creatinine, Ser 1.74 (H) 0.57 - 1.00 mg/dL   GFR calc non Af Amer 29 (L) >59 mL/min/1.73   GFR calc Af Amer 34 (L) >59 mL/min/1.73   BUN/Creatinine  Ratio 40 (H) 12 - 28   Sodium 146 (H) 134 - 144 mmol/L   Potassium 6.2 (H) 3.5 - 5.2 mmol/L   Chloride 110 (H) 96 - 106 mmol/L   CO2 18 (L) 20 - 29 mmol/L   Calcium 10.1 8.7 - 10.3 mg/dL   Total Protein 7.7 6.0 - 8.5 g/dL   Albumin 4.7 3.8 - 4.8 g/dL    Comment:               **Please note reference interval change**   Globulin, Total 3.0 1.5 - 4.5 g/dL   Albumin/Globulin Ratio 1.6 1.2 - 2.2   Bilirubin Total 0.5 0.0 - 1.2 mg/dL   Alkaline Phosphatase 95 39 - 117 IU/L   AST 22 0 - 40 IU/L   ALT 14 0 - 32 IU/L  Hemoglobin A1c     Status: None   Collection Time: 02/18/18 12:11 PM  Result Value Ref Range   Hgb A1c MFr Bld 4.9 4.8 - 5.6 %    Comment:          Prediabetes: 5.7 - 6.4          Diabetes:  >6.4          Glycemic control for adults with diabetes: <7.0    Est. average glucose Bld gHb Est-mCnc 94 mg/dL    Objective: There were no vitals filed for this visit.  General: Patient is awake, alert, oriented x 3 and in no acute distress.  Dermatology: Skin is warm and dry bilateral with a NOW healed ulceration present  Right sub met 1. There is no underlying opening. There is no malodor, no active drainage, no erythema, no edema. No acute signs of infection.  Callus sub met 1 on left and Bilateral hallux and sub met 5 on left with no signs of infection  Nails x 10 short and thickened consistent with oncyhomycosis   Vascular: Dorsalis Pedis pulse = 1/4 Bilateral,  Posterior Tibial pulse = 0/4 Bilateral,  Capillary Fill Time < 5 seconds, Lymphedema bilateral  Neurologic: Protective absent bilateral  Musculosketal: No Pain with palpation to healed ulcerated area on right. No pain with compression to calves bilateral. + Bunion, hammertoe, pes planus bilateral.  No results for input(s): GRAMSTAIN, LABORGA in the last 8760 hours.  Assessment and Plan:  Problem List Items Addressed This Visit      Endocrine   Diabetes mellitus with stage 3 chronic kidney disease ([place])     Musculoskeletal and Integument   Skin ulcer of right foot with fat layer exposed ([place]) - Primary    Other Visit Diagnoses    Diabetic polyneuropathy associated with type 2 diabetes mellitus ([facility])       Callus       Pes planus of both feet       Hammer toe, unspecified laterality       Bunion         -Examined patient and discussed the progression of the healed wound and treatment alternatives. -Right foot ulceration is healed. No need for betadine at this time. -Continue with foot miracle cream to dry skin areas -Rick dispensed Diabetic shoes with wear and break in period explained -Patient to return to office in 4 weeks for final  wound and diabetic shoe check or sooner if problems arise.  Landis Martins, DPM

## 2018-05-14 ENCOUNTER — Other Ambulatory Visit: Payer: Self-pay | Admitting: Internal Medicine

## 2018-05-18 ENCOUNTER — Other Ambulatory Visit: Payer: Self-pay

## 2018-05-18 MED ORDER — FUROSEMIDE 40 MG PO TABS: 40.0000 mg | ORAL_TABLET | Freq: Every day | ORAL | 1 refills | Status: DC

## 2018-05-21 ENCOUNTER — Other Ambulatory Visit: Payer: Self-pay

## 2018-05-21 MED ORDER — CETIRIZINE HCL 10 MG PO TABS: 10.0000 mg | ORAL_TABLET | Freq: Every day | ORAL | 1 refills | Status: DC

## 2018-05-25 ENCOUNTER — Encounter (INDEPENDENT_AMBULATORY_CARE_PROVIDER_SITE_OTHER): Payer: Medicare Other | Admitting: Ophthalmology

## 2018-05-25 ENCOUNTER — Other Ambulatory Visit: Payer: Self-pay

## 2018-05-25 DIAGNOSIS — I10 Essential (primary) hypertension: Secondary | ICD-10-CM

## 2018-05-25 DIAGNOSIS — H35033 Hypertensive retinopathy, bilateral: Secondary | ICD-10-CM | POA: Diagnosis not present

## 2018-05-25 DIAGNOSIS — H34813 Central retinal vein occlusion, bilateral, with macular edema: Secondary | ICD-10-CM | POA: Diagnosis not present

## 2018-05-25 DIAGNOSIS — H43813 Vitreous degeneration, bilateral: Secondary | ICD-10-CM | POA: Diagnosis not present

## 2018-06-09 ENCOUNTER — Other Ambulatory Visit: Payer: Self-pay

## 2018-06-09 ENCOUNTER — Ambulatory Visit (INDEPENDENT_AMBULATORY_CARE_PROVIDER_SITE_OTHER): Payer: Medicare Other | Admitting: Sports Medicine

## 2018-06-09 ENCOUNTER — Encounter: Payer: Self-pay | Admitting: Sports Medicine

## 2018-06-09 VITALS — Temp 98.1°F

## 2018-06-09 DIAGNOSIS — L97511 Non-pressure chronic ulcer of other part of right foot limited to breakdown of skin: Secondary | ICD-10-CM

## 2018-06-09 DIAGNOSIS — E1122 Type 2 diabetes mellitus with diabetic chronic kidney disease: Secondary | ICD-10-CM

## 2018-06-09 DIAGNOSIS — L84 Corns and callosities: Secondary | ICD-10-CM

## 2018-06-09 DIAGNOSIS — N183 Chronic kidney disease, stage 3 unspecified: Secondary | ICD-10-CM

## 2018-06-09 DIAGNOSIS — E1142 Type 2 diabetes mellitus with diabetic polyneuropathy: Secondary | ICD-10-CM

## 2018-06-09 NOTE — Progress Notes (Signed)
Subjective: Mary [name] is a 73 y.o. female patient seen in office for evaluation of ulceration of the Right foot. Patient reports that the area on right foot remains dry and has not had any problems. Has not tried to wear her new shoes yet because has to get new socks/knee highs. Denies nausea/fever/vomiting/chills/night sweats/shortness of breath/pain. Patient has no other pedal complaints at this time.  FBS 110 today and A1c 4.9.  Patient Active Problem List   Diagnosis Date Noted  . Diabetes mellitus with stage 3 chronic kidney disease ([place]) 02/21/2018  . 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 Primary osteoarthritis of both knees 02/21/2018  . Class 3 severe obesity due to excess calories with serious comorbidity and body mass index (BMI) of 45.0 to 49.9 in adult ([place]) 02/21/2018  . Skin ulcer of right foot with fat layer exposed ([place]) 02/18/2018  . Morbid (severe) obesity due to excess calories ([place]) 11/18/2017  . Knee pain 07/16/2015  . Lateral dislocation of right patella 05/04/2015  . Rupture of right quadriceps tendon 05/24/2014  . S/P TKR (total knee replacement) 05/05/2012   Current Outpatient Medications on File Prior to Visit  Medication Sig Dispense Refill  . ACCU-CHEK AVIVA PLUS test strip See admin instructions. for testing  11  . aspirin EC 81 MG tablet Take 81 mg by mouth daily.    [name] BESIVANCE 0.6 % SUSP     . Blood Glucose Monitoring Suppl (ACCU-CHEK AVIVA PLUS) w/Device KIT Use as directed to check blood sugars 2 times per day dx: e11.65 1 kit 1  . cetirizine (ZYRTEC) 10 MG tablet Take 1 tablet (10 mg total) by mouth daily. 90 tablet 1  . fluticasone (FLONASE) 50 MCG/ACT nasal spray Place 2 sprays into the nose daily. (Patient taking differently: Place 2 sprays into the nose daily as needed for allergies. ) 16 g 0  . furosemide (LASIX) 40 MG tablet Take 1 tablet (40 mg total) by mouth daily. 90 tablet 1  . KLOR-CON M20 20 MEQ tablet Take 20 mEq by mouth  daily. with food  1  . levothyroxine (SYNTHROID, LEVOTHROID) 112 MCG tablet Take 112 mcg by mouth daily before breakfast.     . Magnesium Oxide -Mg Supplement 400 MG CAPS TAKE 1 CAPSULE BY MOUTH EVERY EVENING 90 capsule 1  . metoprolol succinate (TOPROL-XL) 50 MG 24 hr tablet Take 50 mg by mouth daily.  6  . omeprazole (PRILOSEC) 20 MG capsule TAKE 1 CAPSULE BY MOUTH  DAILY 90 capsule 1  . oxyCODONE-acetaminophen (PERCOCET) 7.5-325 MG tablet Take 1 tablet by mouth every 4 (four) hours as needed. pain 30 tablet 0  . OZEMPIC, 0.25 OR 0.5 MG/DOSE, 2 MG/1.5ML SOPN Inject 0.5 mg into the skin once a week. 3 pen 2  . pravastatin (PRAVACHOL) 40 MG tablet TAKE 1 TABLET BY MOUTH EVERY DAY IN THE EVENING 90 tablet 1  . telmisartan (MICARDIS) 80 MG tablet TAKE 1 TABLET BY MOUTH ONCE A DAY. 90 tablet 0   No current facility-administered medications on file prior to visit.    No Known Allergies  No results found for this or any previous visit (from the past 2160 hour(s)).  Objective: There were no vitals filed for this visit.  General: Patient is awake, alert, oriented x 3 and in no acute distress.  Dermatology: Skin is warm and dry bilateral with a callus sub met 1 on right and once debrided revealed a recurrent ulceration that measures 0.5x0.3cm with a granular base. There is  no malodor, no active drainage, no erythema, no edema. No acute signs of infection.  Callus sub met 1 on left and Bilateral hallux and sub met 5 on left with no signs of infection  Nails x 10 short and thickened consistent with oncyhomycosis   Vascular: Dorsalis Pedis pulse = 1/4 Bilateral,  Posterior Tibial pulse = 0/4 Bilateral,  Capillary Fill Time < 5 seconds, Lymphedema bilateral  Neurologic: Protective absent bilateral  Musculosketal: No Pain with palpation to reulcerated area on right. No pain with compression to calves bilateral. + Bunion, hammertoe, pes planus bilateral.  No results for input(s): GRAMSTAIN,  LABORGA in the last 8760 hours.  Assessment and Plan:  Problem List Items Addressed This Visit      Endocrine   Diabetes mellitus with stage 3 chronic kidney disease ([place])    Other Visit Diagnoses    Foot ulcer, right, limited to breakdown of skin ([place])    -  Primary   Callus       Diabetic polyneuropathy associated with type 2 diabetes mellitus ([place])         -Examined patient and discussed the progression of the healed wound and treatment alternatives. -Excisionally dedbrided ulceration at Right foot sub met 1 to healthy bleeding borders removing nonviable tissue using a sterile chisel blade. Wound measures post debridement as above. Wound was debrided to the level of the dermis with viable wound base exposed to promote healing. Hemostasis was achieved with manuel pressure. Patient tolerated procedure well without any discomfort or anesthesia necessary for this wound debridement.  -Applied betadine, guaze, bandaid dressing and advised patient to continue with same at home daily. - Advised patient to go to the ER or return to office if the wound worsens or if constitutional symptoms are present. -Applied offloading padding to right shoe and advised patient to start wearing her new shoes -Patient to return to office in 4 weeks for wound care or sooner if problems arise.  Landis Martins, DPM

## 2018-06-24 ENCOUNTER — Other Ambulatory Visit: Payer: Self-pay

## 2018-06-24 ENCOUNTER — Ambulatory Visit: Payer: Medicare Other | Admitting: Internal Medicine

## 2018-06-24 ENCOUNTER — Ambulatory Visit (INDEPENDENT_AMBULATORY_CARE_PROVIDER_SITE_OTHER): Payer: Medicare Other | Admitting: Internal Medicine

## 2018-06-24 ENCOUNTER — Encounter: Payer: Self-pay | Admitting: Internal Medicine

## 2018-06-24 VITALS — BP 116/84 | HR 74 | Temp 98.4°F | Ht 66.0 in | Wt 280.0 lb

## 2018-06-24 DIAGNOSIS — R197 Diarrhea, unspecified: Secondary | ICD-10-CM | POA: Diagnosis not present

## 2018-06-24 DIAGNOSIS — E1122 Type 2 diabetes mellitus with diabetic chronic kidney disease: Secondary | ICD-10-CM | POA: Diagnosis not present

## 2018-06-24 DIAGNOSIS — N183 Chronic kidney disease, stage 3 unspecified: Secondary | ICD-10-CM

## 2018-06-24 DIAGNOSIS — I129 Hypertensive chronic kidney disease with stage 1 through stage 4 chronic kidney disease, or unspecified chronic kidney disease: Secondary | ICD-10-CM

## 2018-06-24 DIAGNOSIS — E039 Hypothyroidism, unspecified: Secondary | ICD-10-CM

## 2018-06-24 DIAGNOSIS — Z6841 Body Mass Index (BMI) 40.0 and over, adult: Secondary | ICD-10-CM

## 2018-06-24 DIAGNOSIS — Z23 Encounter for immunization: Secondary | ICD-10-CM

## 2018-06-24 MED ORDER — PNEUMOCOCCAL 13-VAL CONJ VACC IM SUSP: 0.5000 mL | INTRAMUSCULAR | 0 refills | Status: AC

## 2018-06-24 NOTE — Patient Instructions (Signed)
PLEASE STOP THE MAGNESIUM  PLEASE STOP OZEMPIC UNTIL FURTHER NOTICE   Diarrhea, Adult Diarrhea is when you pass loose and watery poop (stool) often. Diarrhea can make you feel weak and cause you to lose water in your body (get dehydrated). Losing water in your body can cause you to:  Feel tired and thirsty.  Have a dry mouth.  Go pee (urinate) less often. Diarrhea often lasts 2-3 days. However, it can last longer if it is a sign of something more serious. It is important to treat your diarrhea as told by your doctor. Follow these instructions at home: Eating and drinking     Follow these instructions as told by your doctor:  Take an ORS (oral rehydration solution). This is a drink that helps you replace fluids and minerals your body lost. It is sold at pharmacies and stores.  Drink plenty of fluids, such as: ? Water. ? Ice chips. ? Diluted fruit juice. ? Low-calorie sports drinks. ? Milk, if you want.  Avoid drinking fluids that have a lot of sugar or caffeine in them.  Eat bland, easy-to-digest foods in small amounts as you are able. These foods include: ? Bananas. ? Applesauce. ? Rice. ? Low-fat (lean) meats. ? Toast. ? Crackers.  Avoid alcohol.  Avoid spicy or fatty foods.  Medicines  Take over-the-counter and prescription medicines only as told by your doctor.  If you were prescribed an antibiotic medicine, take it as told by your doctor. Do not stop using the antibiotic even if you start to feel better. General instructions   Wash your hands often using soap and water. If soap and water are not available, use a hand sanitizer. Others in your home should wash their hands as well. Hands should be washed: ? After using the toilet or changing a diaper. ? Before preparing, cooking, or serving food. ? While caring for a sick person. ? While visiting someone in a hospital.  Drink enough fluid to keep your pee (urine) pale yellow.  Rest at home while you get  better.  Watch your condition for any changes.  Take a warm bath to help with any burning or pain from having diarrhea.  Keep all follow-up visits as told by your doctor. This is important. Contact a doctor if:  You have a fever.  Your diarrhea gets worse.  You have new symptoms.  You cannot keep fluids down.  You feel light-headed or dizzy.  You have a headache.  You have muscle cramps. Get help right away if:  You have chest pain.  You feel very weak or you pass out (faint).  You have bloody or black poop or poop that looks like tar.  You have very bad pain, cramping, or bloating in your belly (abdomen).  You have trouble breathing or you are breathing very quickly.  Your heart is beating very quickly.  Your skin feels cold and clammy.  You feel confused.  You have signs of losing too much water in your body, such as: ? Dark pee, very little pee, or no pee. ? Cracked lips. ? Dry mouth. ? Sunken eyes. ? Sleepiness. ? Weakness. Summary  Diarrhea is when you pass loose and watery poop (stool) often.  Diarrhea can make you feel weak and cause you to lose water in your body (get dehydrated).  Take an ORS (oral rehydration solution). This is a drink that is sold at pharmacies and stores.  Eat bland, easy-to-digest foods in small amounts as you are  able.  Contact a doctor if your condition gets worse. Get help right away if you have signs that you have lost too much water in your body. This information is not intended to replace advice given to you by your health care provider. Make sure you discuss any questions you have with your health care provider. Document Released: 06/19/2007 Document Revised: 06/06/2017 Document Reviewed: 06/06/2017 Elsevier Interactive Patient Education  2019 [employer].

## 2018-06-24 NOTE — Progress Notes (Signed)
Subjective:     Patient ID: Mary [name] , female    DOB: 1945-11-01 , 73 y.o.   MRN: [id number]   Chief Complaint  Patient presents with  . Hypertension  . Diabetes    HPI  Hypertension  This is a chronic problem. The current episode started more than 1 year ago. The problem has been gradually improving since onset. Pertinent negatives include no blurred vision, chest pain, palpitations or shortness of breath. Risk factors for coronary artery disease include diabetes mellitus, dyslipidemia, obesity, post-menopausal state and sedentary lifestyle. The current treatment provides moderate improvement. Hypertensive end-organ damage includes kidney disease.  Diabetes  She presents for her follow-up diabetic visit. She has type 2 diabetes mellitus. There are no hypoglycemic associated symptoms. Pertinent negatives for diabetes include no blurred vision and no chest pain. There are no hypoglycemic complications. Risk factors for coronary artery disease include diabetes mellitus, dyslipidemia, post-menopausal, sedentary lifestyle, hypertension and obesity. She is following a diabetic diet. Meal planning includes avoidance of concentrated sweets. She participates in exercise intermittently. An ACE inhibitor/angiotensin II receptor blocker is being taken. Eye exam is current.     Past Medical History:  Diagnosis Date  . Arthritis    osteoarthritis  . Diabetes mellitus    Insulin dependent  . H/O cardiovascular stress test    pt. reports 10/13- was normal per pt.   . Hyperlipemia   . Hypertension      Family History  Problem Relation Age of Onset  . Hypertension Mother   . Heart Problems Father   . Gout Father   . Arthritis Father      Current Outpatient Medications:  .  ACCU-CHEK AVIVA PLUS test strip, See admin instructions. for testing, Disp: , Rfl: 11 .  aspirin EC 81 MG tablet, Take 81 mg by mouth daily., Disp: , Rfl:  .  BESIVANCE 0.6 % SUSP, , Disp: , Rfl:  .  Blood Glucose  Monitoring Suppl (ACCU-CHEK AVIVA PLUS) w/Device KIT, Use as directed to check blood sugars 2 times per day dx: e11.65, Disp: 1 kit, Rfl: 1 .  cetirizine (ZYRTEC) 10 MG tablet, Take 1 tablet (10 mg total) by mouth daily., Disp: 90 tablet, Rfl: 1 .  fluticasone (FLONASE) 50 MCG/ACT nasal spray, Place 2 sprays into the nose daily. (Patient taking differently: Place 2 sprays into the nose daily as needed for allergies. ), Disp: 16 g, Rfl: 0 .  furosemide (LASIX) 40 MG tablet, Take 1 tablet (40 mg total) by mouth daily., Disp: 90 tablet, Rfl: 1 .  levothyroxine (SYNTHROID, LEVOTHROID) 112 MCG tablet, Take 112 mcg by mouth daily before breakfast. , Disp: , Rfl:  .  metoprolol succinate (TOPROL-XL) 50 MG 24 hr tablet, Take 50 mg by mouth daily., Disp: , Rfl: 6 .  omeprazole (PRILOSEC) 20 MG capsule, TAKE 1 CAPSULE BY MOUTH  DAILY, Disp: 90 capsule, Rfl: 1 .  oxyCODONE-acetaminophen (PERCOCET) 7.5-325 MG tablet, Take 1 tablet by mouth every 4 (four) hours as needed. pain, Disp: 30 tablet, Rfl: 0 .  OZEMPIC, 0.25 OR 0.5 MG/DOSE, 2 MG/1.5ML SOPN, Inject 0.5 mg into the skin once a week., Disp: 3 pen, Rfl: 2 .  pravastatin (PRAVACHOL) 40 MG tablet, TAKE 1 TABLET BY MOUTH EVERY DAY IN THE EVENING, Disp: 90 tablet, Rfl: 1 .  telmisartan (MICARDIS) 80 MG tablet, TAKE 1 TABLET BY MOUTH ONCE A DAY., Disp: 90 tablet, Rfl: 0 .  KLOR-CON M20 20 MEQ tablet, Take 20 mEq by mouth daily. with  food, Disp: , Rfl: 1   No Known Allergies   Review of Systems  Constitutional: Negative.   Eyes: Negative for blurred vision.  Respiratory: Negative.  Negative for shortness of breath.   Cardiovascular: Negative.  Negative for chest pain and palpitations.  Gastrointestinal: Positive for diarrhea.       She reports diarrhea. She often has runny stools shortly after eating. She is not sure what is causing her symptoms. She denies fever/chills.   Neurological: Negative.   Psychiatric/Behavioral: Negative.      Today's Vitals    06/24/18 0919  BP: 116/84  Pulse: 74  Temp: 98.4 F (36.9 C)  TempSrc: Oral  Weight: 280 lb (127 kg)  Height: 5' 6"  (1.676 m)  PainSc: 0-No pain   Body mass index is 45.19 kg/m.   Objective:  Physical Exam Vitals signs and nursing note reviewed.  Constitutional:      Appearance: Normal appearance. She is obese.  HENT:     Head: Normocephalic and atraumatic.  Cardiovascular:     Rate and Rhythm: Normal rate and regular rhythm.     Heart sounds: Normal heart sounds.  Pulmonary:     Effort: Pulmonary effort is normal.     Breath sounds: Normal breath sounds.  Musculoskeletal:     Comments: Ambulatory with walker  Skin:    General: Skin is warm.  Neurological:     General: No focal deficit present.     Mental Status: She is alert.  Psychiatric:        Mood and Affect: Mood normal.        Behavior: Behavior normal.         Assessment And Plan:     1. Parenchymal renal hypertension, stage 1 through stage 4 or unspecified chronic kidney disease  Well controlled. She will continue with current meds. She is encouraged to avoid adding salt to her foods. I will check BMET today.   2. Diabetes mellitus with stage 3 chronic kidney disease ([place])  She is advised to stop the Ozempic. This could be exacerbating her diarrhea. She is advised to discard current pen and to leave extra pens in the fridge. We may resume medication once I get her symptoms under control.   3. Diarrhea, unspecified type  Again, she was advised to stop Ozempic. She was also advised to stop magnesium supplementation. She is advised that this would exacerbate her symptoms. She is advised to eat 1/2 baked potato to help firm up her stools. I will check in with her next week to see if her sx have resolved.   4. Hypothyroidism, unspecified type  I will check thyroid panel and adjust meds as needed.   5. Class 3 severe obesity due to excess calories with serious comorbidity and body mass index (BMI) of  45.0 to 49.9 in adult [facility])  Importance of achieving optimal weight to decrease risk of cardiovascular disease and cancers was discussed with the patient in full detail. She is encouraged to start slowly - start with 10 minutes twice daily at least three to four days per week and to gradually build to 30 minutes five days weekly. She is reminded that chair exercises count as well.    Maximino Greenland, MD    THE PATIENT IS ENCOURAGED TO PRACTICE SOCIAL DISTANCING DUE TO THE COVID-19 PANDEMIC.

## 2018-06-25 LAB — BMP8+EGFR
BUN/Creatinine Ratio: 16 (ref 12–28)
BUN: 28 mg/dL — ABNORMAL HIGH (ref 8–27)
CO2: 23 mmol/L (ref 20–29)
Calcium: 9.6 mg/dL (ref 8.7–10.3)
Chloride: 100 mmol/L (ref 96–106)
Creatinine, Ser: 1.78 mg/dL — ABNORMAL HIGH (ref 0.57–1.00)
GFR calc Af Amer: 33 mL/min/{1.73_m2} — ABNORMAL LOW (ref >59)
GFR calc non Af Amer: 28 mL/min/{1.73_m2} — ABNORMAL LOW (ref >59)
Glucose: 89 mg/dL (ref 65–99)
Potassium: 5.6 mmol/L — ABNORMAL HIGH (ref 3.5–5.2)
Sodium: 136 mmol/L (ref 134–144)

## 2018-06-25 LAB — HEMOGLOBIN A1C
Est. average glucose Bld gHb Est-mCnc: 100 mg/dL
Hgb A1c MFr Bld: 5.1 % (ref 4.8–5.6)

## 2018-06-25 LAB — TSH: TSH: 2.43 u[IU]/mL (ref 0.450–4.500)

## 2018-07-01 ENCOUNTER — Telehealth: Payer: Self-pay

## 2018-07-01 DIAGNOSIS — R197 Diarrhea, unspecified: Secondary | ICD-10-CM

## 2018-07-01 NOTE — Telephone Encounter (Signed)
Called pt to inform her that we sent the referral to see dr Collene Mares

## 2018-07-02 ENCOUNTER — Other Ambulatory Visit: Payer: Self-pay | Admitting: Internal Medicine

## 2018-07-06 ENCOUNTER — Encounter (INDEPENDENT_AMBULATORY_CARE_PROVIDER_SITE_OTHER): Payer: Medicare Other | Admitting: Ophthalmology

## 2018-07-14 ENCOUNTER — Ambulatory Visit (INDEPENDENT_AMBULATORY_CARE_PROVIDER_SITE_OTHER): Payer: Medicare Other | Admitting: Sports Medicine

## 2018-07-14 ENCOUNTER — Other Ambulatory Visit: Payer: Self-pay

## 2018-07-14 ENCOUNTER — Encounter: Payer: Self-pay | Admitting: Sports Medicine

## 2018-07-14 VITALS — Temp 98.1°F

## 2018-07-14 DIAGNOSIS — B351 Tinea unguium: Secondary | ICD-10-CM

## 2018-07-14 DIAGNOSIS — L84 Corns and callosities: Secondary | ICD-10-CM

## 2018-07-14 DIAGNOSIS — M79674 Pain in right toe(s): Secondary | ICD-10-CM | POA: Diagnosis not present

## 2018-07-14 DIAGNOSIS — I739 Peripheral vascular disease, unspecified: Secondary | ICD-10-CM

## 2018-07-14 DIAGNOSIS — E1142 Type 2 diabetes mellitus with diabetic polyneuropathy: Secondary | ICD-10-CM | POA: Diagnosis not present

## 2018-07-14 DIAGNOSIS — M79675 Pain in left toe(s): Secondary | ICD-10-CM | POA: Diagnosis not present

## 2018-07-14 NOTE — Progress Notes (Signed)
Subjective: Magdalyn [name] is a 73 y.o. female patient with history of diabetes who presents to office today complaining of long,mildly painful nails and callus while ambulating in shoes; unable to trim. Patient denies any drainage from the right, states that the glucose reading this morning was not recorded but last A1c 5.1, Patient denies any new changes in medication or new problems. Admits to swelling and has compression stocking that she wants to see if she can start wearing.   Patient Active Problem List   Diagnosis Date Noted  . Diabetes mellitus with stage 3 chronic kidney disease ([place]) 02/21/2018  . 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 Primary osteoarthritis of both knees 02/21/2018  . Class 3 severe obesity due to excess calories with serious comorbidity and body mass index (BMI) of 45.0 to 49.9 in adult ([place]) 02/21/2018  . Skin ulcer of right foot with fat layer exposed ([place]) 02/18/2018  . Morbid (severe) obesity due to excess calories ([place]) 11/18/2017  . Knee pain 07/16/2015  . Lateral dislocation of right patella 05/04/2015  . Rupture of right quadriceps tendon 05/24/2014  . S/P TKR (total knee replacement) 05/05/2012   Current Outpatient Medications on File Prior to Visit  Medication Sig Dispense Refill  . ACCU-CHEK AVIVA PLUS test strip See admin instructions. for testing  11  . aspirin EC 81 MG tablet Take 81 mg by mouth daily.    [name] BESIVANCE 0.6 % SUSP     . Blood Glucose Monitoring Suppl (ACCU-CHEK AVIVA PLUS) w/Device KIT Use as directed to check blood sugars 2 times per day dx: e11.65 1 kit 1  . cetirizine (ZYRTEC) 10 MG tablet Take 1 tablet (10 mg total) by mouth daily. 90 tablet 1  . fluticasone (FLONASE) 50 MCG/ACT nasal spray Place 2 sprays into the nose daily. (Patient taking differently: Place 2 sprays into the nose daily as needed for allergies. ) 16 g 0  . furosemide (LASIX) 40 MG tablet Take 1 tablet (40 mg total) by mouth daily. 90 tablet 1  .  KLOR-CON M20 20 MEQ tablet Take 20 mEq by mouth daily. with food  1  . levothyroxine (SYNTHROID, LEVOTHROID) 112 MCG tablet Take 112 mcg by mouth daily before breakfast.     . metoprolol succinate (TOPROL-XL) 50 MG 24 hr tablet Take 50 mg by mouth daily.  6  . omeprazole (PRILOSEC) 20 MG capsule TAKE 1 CAPSULE BY MOUTH  DAILY 90 capsule 1  . oxyCODONE-acetaminophen (PERCOCET) 7.5-325 MG tablet Take 1 tablet by mouth every 4 (four) hours as needed. pain 30 tablet 0  . OZEMPIC, 0.25 OR 0.5 MG/DOSE, 2 MG/1.5ML SOPN Inject 0.5 mg into the skin once a week. 3 pen 2  . pravastatin (PRAVACHOL) 40 MG tablet TAKE 1 TABLET BY MOUTH EVERY DAY IN THE EVENING 90 tablet 1  . telmisartan (MICARDIS) 80 MG tablet TAKE 1 TABLET BY MOUTH  DAILY 90 tablet 0   No current facility-administered medications on file prior to visit.    No Known Allergies  Recent Results (from the past 2160 hour(s))  BMP8+EGFR     Status: Abnormal   Collection Time: 06/24/18 10:07 AM  Result Value Ref Range   Glucose 89 65 - 99 mg/dL   BUN 28 (H) 8 - 27 mg/dL   Creatinine, Ser 1.78 (H) 0.57 - 1.00 mg/dL   GFR calc non Af Amer 28 (L) >59 mL/min/1.73   GFR calc Af Amer 33 (L) >59 mL/min/1.73   BUN/Creatinine Ratio 16 12 -  28   Sodium 136 134 - 144 mmol/L   Potassium 5.6 (H) 3.5 - 5.2 mmol/L   Chloride 100 96 - 106 mmol/L   CO2 23 20 - 29 mmol/L   Calcium 9.6 8.7 - 10.3 mg/dL  Hemoglobin A1c     Status: None   Collection Time: 06/24/18 10:07 AM  Result Value Ref Range   Hgb A1c MFr Bld 5.1 4.8 - 5.6 %    Comment:          Prediabetes: 5.7 - 6.4          Diabetes: >6.4          Glycemic control for adults with diabetes: <7.0    Est. average glucose Bld gHb Est-mCnc 100 mg/dL  TSH     Status: None   Collection Time: 06/24/18 10:07 AM  Result Value Ref Range   TSH 2.430 0.450 - 4.500 uIU/mL    Objective: General: Patient is awake, alert, and oriented x 3 and in no acute distress.  Integument: Skin is warm, dry and  supple bilateral. Nails are tender, long, thickened and  dystrophic with subungual debris, consistent with onychomycosis, 1-5 bilateral. No signs of infection. + sub met 1 & 5 preulcerative lesions present bilateral. Remaining integument unremarkable.  Vasculature:  Dorsalis Pedis pulse 1/4 bilateral. Posterior Tibial pulse  0/4 bilateral.  Capillary fill time <3 sec 1-5 bilateral. Scant hair growth to the level of the digits. Temperature gradient within normal limits. No varicosities present bilateral. Chronic edema present bilateral.   Neurology: The patient has absent sensation measured with a 5.07/10g Semmes Weinstein Monofilament at all pedal sites bilateral . Vibratory sensation absent bilateral with tuning fork. No Babinski sign present bilateral.   Musculoskeletal: Asymptomatic bunion, pes planus, and hammertoe pedal deformities noted bilateral. Muscular strength 4/5 in all lower extremity muscular groups bilateral without pain on range of motion . No tenderness with calf compression bilateral.  Assessment and Plan: Problem List Items Addressed This Visit    None    Visit Diagnoses    Pre-ulcerative calluses    -  Primary   Pain due to onychomycosis of toenails of both feet       Diabetic polyneuropathy associated with type 2 diabetes mellitus ([place])       PVD (peripheral vascular disease) ([place])          -Examined patient. -Discussed and educated patient on diabetic foot care, especially with  regards to the vascular, neurological and musculoskeletal systems.  -Stressed the importance of good glycemic control and the detriment of not  controlling glucose levels in relation to the foot. -Mechanically debrided  Callus x 4 using sterile chisel blade and all nails 1-5 bilateral using sterile nail nipper and filed with dremel without incident  -Previous right foot ulcer is healed -May use compression garment next week -Answered all patient questions -Patient to return  in 62 days  for at risk foot care -Patient advised to call the office if any problems or questions arise in the meantime.  Landis Martins, DPM

## 2018-07-15 ENCOUNTER — Telehealth: Payer: Self-pay | Admitting: Internal Medicine

## 2018-07-15 NOTE — Telephone Encounter (Signed)
I called the patient to schedule AWV with [place].  She said that she has already had that done this year.  I explained that I'm showing she saw [place] in October of last year, but not this year.  She still believes that it was this year, so I told her that I would check with [place]. VDM (DD)

## 2018-07-15 NOTE — Telephone Encounter (Signed)
Mary [name] had no record of AWV this year, so I called the patient back to schedule AWV.  She preferred to do it virtually. VDM (DD)

## 2018-07-20 LAB — COLOGUARD: Cologuard: NEGATIVE

## 2018-07-21 ENCOUNTER — Other Ambulatory Visit: Payer: Self-pay

## 2018-07-21 ENCOUNTER — Ambulatory Visit (INDEPENDENT_AMBULATORY_CARE_PROVIDER_SITE_OTHER): Payer: Medicare Other

## 2018-07-21 VITALS — Ht 67.0 in | Wt 290.0 lb

## 2018-07-21 DIAGNOSIS — Z0000 Encounter for general adult medical examination without abnormal findings: Secondary | ICD-10-CM

## 2018-07-21 NOTE — Progress Notes (Signed)
Subjective:   Mary [name] is a 73 y.o. female who presents for Medicare Annual (Subsequent) preventive examination.  This visit type was conducted due to national recommendations for restrictions regarding the COVID-19 Pandemic (e.g. social distancing). This format is felt to be most appropriate for this patient at this time. All issues noted in this document were discussed and addressed. No physical exam was performed (except for noted visual exam findings with Video Visits). This patient, Mary [name], has given permission to perform this visit via telephone. Vital signs may be absent or patient reported.  Patient location:  At home  Nurse location:  [place] office     Review of Systems:  n/a Cardiac Risk Factors include: advanced age (>63mn, >>58women);diabetes mellitus;hypertension;obesity (BMI >30kg/m2)     Objective:     Vitals: Ht 5' 7"  (1.702 m) Comment: per patient  Wt 290 lb (131.5 kg) Comment: per patient  BMI 45.42 kg/m   Body mass index is 45.42 kg/m.  Advanced Directives 07/21/2018 10/30/2017 07/16/2015 07/08/2015 06/06/2015 05/13/2012 02/14/2012  Does Patient Have a Medical Advance Directive? Yes Yes No No No Patient does not have advance directive Patient does not have advance directive;Patient would not like information  Type of AScientist, forensicPower of [place]Living will [place] will - - - - -  Does patient want to make changes to medical advance directive? - No - Patient declined - - - - -  Copy of [place] Chart? No - copy requested No - copy requested - - - - -  Would patient like information on creating a medical advance directive? - - - No - patient declined information No - patient declined information - -  Pre-existing out of facility DNR order (yellow form or pink MOST form) - - - - - - -    Tobacco Social History   Tobacco Use  Smoking Status Former Smoker  . Packs/day: 0.25  .  Years: 20.00  . Pack years: 5.00  . Types: Cigarettes  . Quit date: 06/28/2014  . Years since quitting: 4.0  Smokeless Tobacco Never Used     Counseling given: Not Answered   Clinical Intake:  Pre-visit preparation completed: Yes  Pain : No/denies pain     Nutritional Status: BMI > 30  Obese Nutritional Risks: None Diabetes: Yes CBG done?: No Did pt. bring in CBG monitor from home?: No  How often do you need to have someone help you when you read instructions, pamphlets, or other written materials from your doctor or pharmacy?: 1 - Never What is the last grade level you completed in school?: 12th grade  Interpreter Needed?: No  Information entered by :: NAllen LPN  Past Medical History:  Diagnosis Date  . Arthritis    osteoarthritis  . Diabetes mellitus    Insulin dependent  . H/O cardiovascular stress test    pt. reports 10/13- was normal per pt.   . Hyperlipemia   . Hypertension    Past Surgical History:  Procedure Laterality Date  . CARPAL TUNNEL RELEASE Bilateral   . KNEE ARTHROSCOPY     right  . QUADRICEPS TENDON REPAIR  02/17/2012   Procedure: REPAIR QUADRICEP TENDON;  Surgeon: SVickey Huger MD;  [facility]  Service: Orthopedics;  Laterality: Right;  right quadriceps repair with latera release  . QUADRICEPS TENDON REPAIR Right 05/15/2012   Procedure: REPAIR QUADRICEP TENDON;  Surgeon: SVickey Huger MD;  [facility]  Service:  Orthopedics;  Laterality: Right;  . TOTAL KNEE ARTHROPLASTY  10/21/2011   rt tk  . TOTAL KNEE ARTHROPLASTY  10/21/2011   Procedure: TOTAL KNEE ARTHROPLASTY;  Surgeon: Rudean Haskell, MD;  Location: [facility];  Service: Orthopedics;  Laterality: Right;  . TUBAL LIGATION     Family History  Problem Relation Age of Onset  . Hypertension Mother   . Heart Problems Father   . Gout Father   . Arthritis Father    Social History   Socioeconomic History  . Marital status: Single    Spouse name: Not on file  . Number of children:  Not on file  . Years of education: Not on file  . Highest education level: Not on file  Occupational History  . Occupation: retired  Scientific laboratory technician  . Financial resource strain: Not hard at all  . Food insecurity    Worry: Never true    Inability: Never true  . Transportation needs    Medical: No    Non-medical: No  Tobacco Use  . Smoking status: Former Smoker    Packs/day: 0.25    Years: 20.00    Pack years: 5.00    Types: Cigarettes    Quit date: 06/28/2014    Years since quitting: 4.0  . Smokeless tobacco: Never Used  Substance and Sexual Activity  . Alcohol use: No  . Drug use: Yes    Types: Oxycodone    Comment: Uses as needed for pain  . Sexual activity: Not Currently  Lifestyle  . Physical activity    Days per week: 2 days    Minutes per session: 20 min  . Stress: Not at all  Relationships  . Social Herbalist on phone: Not on file    Gets together: Not on file    Attends religious service: Not on file    Active member of club or organization: Not on file    Attends meetings of clubs or organizations: Not on file    Relationship status: Not on file  Other Topics Concern  . Not on file  Social History Narrative  . Not on file    Outpatient Encounter Medications as of 07/21/2018  Medication Sig  . ACCU-CHEK AVIVA PLUS test strip See admin instructions. for testing  . aspirin EC 81 MG tablet Take 81 mg by mouth daily.  [name] BESIVANCE 0.6 % SUSP   . Blood Glucose Monitoring Suppl (ACCU-CHEK AVIVA PLUS) w/Device KIT Use as directed to check blood sugars 2 times per day dx: e11.65  . cetirizine (ZYRTEC) 10 MG tablet Take 1 tablet (10 mg total) by mouth daily.  . fluticasone (FLONASE) 50 MCG/ACT nasal spray Place 2 sprays into the nose daily. (Patient taking differently: Place 2 sprays into the nose daily as needed for allergies. )  . furosemide (LASIX) 40 MG tablet Take 1 tablet (40 mg total) by mouth daily.  [name] KLOR-CON M20 20 MEQ tablet Take 20 mEq by mouth  daily. with food  . levothyroxine (SYNTHROID, LEVOTHROID) 112 MCG tablet Take 112 mcg by mouth daily before breakfast.   . metoprolol succinate (TOPROL-XL) 50 MG 24 hr tablet Take 50 mg by mouth daily.  [name] omeprazole (PRILOSEC) 20 MG capsule TAKE 1 CAPSULE BY MOUTH  DAILY  . oxyCODONE-acetaminophen (PERCOCET) 7.5-325 MG tablet Take 1 tablet by mouth every 4 (four) hours as needed. pain  . OZEMPIC, 0.25 OR 0.5 MG/DOSE, 2 MG/1.5ML SOPN Inject 0.5 mg into the skin once a  week.  . pravastatin (PRAVACHOL) 40 MG tablet TAKE 1 TABLET BY MOUTH EVERY DAY IN THE EVENING  . telmisartan (MICARDIS) 80 MG tablet TAKE 1 TABLET BY MOUTH  DAILY   No facility-administered encounter medications on file as of 07/21/2018.     Activities of Daily Living In your present state of health, do you have any difficulty performing the following activities: 07/21/2018 10/30/2017  Hearing? N N  Vision? 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 N N  Walking or climbing stairs? N Y  Comment - Uses walker  Dressing or bathing? N N  Doing errands, shopping? N Y  Comment - Can not stand up long enough. Some takes her  Preparing Food and eating ? N Y  Comment - Can not stand up long for soome cooking tasks  Using the Toilet? N N  In the past six months, have you accidently leaked urine? N N  Do you have problems with loss of bowel control? N N  Managing your Medications? N N  Managing your Finances? N N  Housekeeping or managing your Housekeeping? N N  Some recent data might be hidden    Patient Care Team: Glendale Chard, MD as PCP - General (Internal Medicine) Hayden Pedro, MD as Consulting Physician (Ophthalmology) Jacelyn Pi, MD as Consulting Physician (Endocrinology) Rex Kras, Claudette Stapler, RN as Case Manager    Assessment:   This is a routine wellness examination for Annise.  Exercise Activities and Dietary recommendations Current Exercise Habits:  Home exercise routine, Type of exercise: stretching;walking, Time (Minutes): 20, Frequency (Times/Week): 2, Weekly Exercise (Minutes/Week): 40  Goals    . Exercise 150 min/wk Moderate Activity (pt-stated)       Fall Risk Fall Risk  07/21/2018 06/24/2018 02/18/2018 11/18/2017 10/30/2017  Falls in the past year? 0 0 1 1 Yes  Number falls in past yr: - - 1 0 1  Comment - - - - Slipped out the new bed  Injury with Fall? - - 0 0 No  Risk for fall due to : Medication side effect - - - History of fall(s);Impaired balance/gait;Impaired mobility;Medication side effect  Follow up Falls evaluation completed;Falls prevention discussed - - - Falls prevention discussed   Is the patient's home free of loose throw rugs in walkways, pet beds, electrical cords, etc?   yes      Grab bars in the bathroom? no      Handrails on the stairs?  n/a      Adequate lighting?   yes  Timed Get Up and Go performed: n/a  Depression Screen PHQ 2/9 Scores 07/21/2018 06/24/2018 02/18/2018 11/18/2017  PHQ - 2 Score 0 0 0 0  PHQ- 9 Score 0 - - -     Cognitive Function     6CIT Screen 07/21/2018 10/30/2017  What Year? 0 points 0 points  What month? 0 points 0 points  What time? 0 points 0 points  Count back from 20 0 points 0 points  Months in reverse 0 points 0 points  Repeat phrase 0 points 0 points  Total Score 0 0    Immunization History  Administered Date(s) Administered  . Influenza Split 10/22/2011  . Influenza-Unspecified 10/16/2017  . Pneumococcal Polysaccharide-23 10/15/2016  . Tdap 02/15/2013    Qualifies for Shingles Vaccine? yes  Screening Tests Health Maintenance  Topic Date Due  . FOOT EXAM  12/20/1955  . PNA vac Low Risk Adult (2 of  2 - PCV13) 10/15/2017  . COLONOSCOPY  10/31/2018 (Originally 12/20/1997)  . INFLUENZA VACCINE  08/15/2018  . OPHTHALMOLOGY EXAM  11/25/2018  . HEMOGLOBIN A1C  12/24/2018  . MAMMOGRAM  03/16/2020  . TETANUS/TDAP  02/16/2023  . DEXA SCAN  Completed  . Hepatitis C  Screening  Completed    Cancer Screenings: Lung: Low Dose CT Chest recommended if Age 47-80 years, 30 pack-year currently smoking OR have quit w/in 15years. Patient does not qualify. Breast:  Up to date on Mammogram? Yes   Up to date of Bone Density/Dexa? Yes Colorectal: does cologuard  Additional Screenings: : Hepatitis C Screening: 11/18/2017     Plan:    6 CIT was 0. States just mailed out cologuard today. States has to get transportation to go get Prevnar 13 at pharmacy.   I have personally reviewed and noted the following in the patient's chart:   . Medical and social history . Use of alcohol, tobacco or illicit drugs  . Current medications and supplements . Functional ability and status . Nutritional status . Physical activity . Advanced directives . List of other physicians . Hospitalizations, surgeries, and ER visits in previous 12 months . Vitals . Screenings to include cognitive, depression, and falls . Referrals and appointments  In addition, I have reviewed and discussed with patient certain preventive protocols, quality metrics, and best practice recommendations. A written personalized care plan for preventive services as well as general preventive health recommendations were provided to patient.     Kellie Simmering, LPN  4/0/9811

## 2018-07-21 NOTE — Patient Instructions (Signed)
Mary [name] , Thank you for taking time to come for your Medicare Wellness Visit. I appreciate your ongoing commitment to your health goals. Please review the following plan we discussed and let me know if I can assist you in the future.   Screening recommendations/referrals: Colonoscopy: just mailed cologuard Mammogram: 03/2018 Bone Density: 03/2018 Recommended yearly ophthalmology/optometry visit for glaucoma screening and checkup Recommended yearly dental visit for hygiene and checkup  Vaccinations: Influenza vaccine: 10/2017 Pneumococcal vaccine: 10/2016 Tdap vaccine: 02/2013 Shingles vaccine: discussed    Advanced directives: Please bring a copy of your POA (Power of [place]) and/or Living Will to your next appointment.    Conditions/risks identified: obesity  Next appointment: 11/05/2018 at 11:00   Preventive Care 54 Years and Older, Female Preventive care refers to lifestyle choices and visits with your health care provider that can promote health and wellness. What does preventive care include?  A yearly physical exam. This is also called an annual well check.  Dental exams once or twice a year.  Routine eye exams. Ask your health care provider how often you should have your eyes checked.  Personal lifestyle choices, including:  Daily care of your teeth and gums.  Regular physical activity.  Eating a healthy diet.  Avoiding tobacco and drug use.  Limiting alcohol use.  Practicing safe sex.  Taking low-dose aspirin every day.  Taking vitamin and mineral supplements as recommended by your health care provider. What happens during an annual well check? The services and screenings done by your health care provider during your annual well check will depend on your age, overall health, lifestyle risk factors, and family history of disease. Counseling  Your health care provider may ask you questions about your:  Alcohol use.  Tobacco use.  Drug use.   Emotional well-being.  Home and relationship well-being.  Sexual activity.  Eating habits.  History of falls.  Memory and ability to understand (cognition).  Work and work Statistician.  Reproductive health. Screening  You may have the following tests or measurements:  Height, weight, and BMI.  Blood pressure.  Lipid and cholesterol levels. These may be checked every 5 years, or more frequently if you are over 21 years old.  Skin check.  Lung cancer screening. You may have this screening every year starting at age 67 if you have a 30-pack-year history of smoking and currently smoke or have quit within the past 15 years.  Fecal occult blood test (FOBT) of the stool. You may have this test every year starting at age 63.  Flexible sigmoidoscopy or colonoscopy. You may have a sigmoidoscopy every 5 years or a colonoscopy every 10 years starting at age 4.  Hepatitis C blood test.  Hepatitis B blood test.  Sexually transmitted disease (STD) testing.  Diabetes screening. This is done by checking your blood sugar (glucose) after you have not eaten for a while (fasting). You may have this done every 1-3 years.  Bone density scan. This is done to screen for osteoporosis. You may have this done starting at age 71.  Mammogram. This may be done every 1-2 years. Talk to your health care provider about how often you should have regular mammograms. Talk with your health care provider about your test results, treatment options, and if necessary, the need for more tests. Vaccines  Your health care provider may recommend certain vaccines, such as:  Influenza vaccine. This is recommended every year.  Tetanus, diphtheria, and acellular pertussis (Tdap, Td) vaccine. You may need a  Td booster every 10 years.  Zoster vaccine. You may need this after age 40.  Pneumococcal 13-valent conjugate (PCV13) vaccine. One dose is recommended after age 29.  Pneumococcal polysaccharide (PPSV23)  vaccine. One dose is recommended after age 56. Talk to your health care provider about which screenings and vaccines you need and how often you need them. This information is not intended to replace advice given to you by your health care provider. Make sure you discuss any questions you have with your health care provider. Document Released: 01/27/2015 Document Revised: 09/20/2015 Document Reviewed: 11/01/2014 Elsevier Interactive Patient Education  2017 [place] Prevention in the Home Falls can cause injuries. They can happen to people of all ages. There are many things you can do to make your home safe and to help prevent falls. What can I do on the outside of my home?  Regularly fix the edges of walkways and driveways and fix any cracks.  Remove anything that might make you trip as you walk through a door, such as a raised step or threshold.  Trim any bushes or trees on the path to your home.  Use bright outdoor lighting.  Clear any walking paths of anything that might make someone trip, such as rocks or tools.  Regularly check to see if handrails are loose or broken. Make sure that both sides of any steps have handrails.  Any raised decks and porches should have guardrails on the edges.  Have any leaves, snow, or ice cleared regularly.  Use sand or salt on walking paths during winter.  Clean up any spills in your garage right away. This includes oil or grease spills. What can I do in the bathroom?  Use night lights.  Install grab bars by the toilet and in the tub and shower. Do not use towel bars as grab bars.  Use non-skid mats or decals in the tub or shower.  If you need to sit down in the shower, use a plastic, non-slip stool.  Keep the floor dry. Clean up any water that spills on the floor as soon as it happens.  Remove soap buildup in the tub or shower regularly.  Attach bath mats securely with double-sided non-slip rug tape.  Do not have throw rugs  and other things on the floor that can make you trip. What can I do in the bedroom?  Use night lights.  Make sure that you have a light by your bed that is easy to reach.  Do not use any sheets or blankets that are too big for your bed. They should not hang down onto the floor.  Have a firm chair that has side arms. You can use this for support while you get dressed.  Do not have throw rugs and other things on the floor that can make you trip. What can I do in the kitchen?  Clean up any spills right away.  Avoid walking on wet floors.  Keep items that you use a lot in easy-to-reach places.  If you need to reach something above you, use a strong step stool that has a grab bar.  Keep electrical cords out of the way.  Do not use floor polish or wax that makes floors slippery. If you must use wax, use non-skid floor wax.  Do not have throw rugs and other things on the floor that can make you trip. What can I do with my stairs?  Do not leave any items on the stairs.  Make sure that there are handrails on both sides of the stairs and use them. Fix handrails that are broken or loose. Make sure that handrails are as long as the stairways.  Check any carpeting to make sure that it is firmly attached to the stairs. Fix any carpet that is loose or worn.  Avoid having throw rugs at the top or bottom of the stairs. If you do have throw rugs, attach them to the floor with carpet tape.  Make sure that you have a light switch at the top of the stairs and the bottom of the stairs. If you do not have them, ask someone to add them for you. What else can I do to help prevent falls?  Wear shoes that:  Do not have high heels.  Have rubber bottoms.  Are comfortable and fit you well.  Are closed at the toe. Do not wear sandals.  If you use a stepladder:  Make sure that it is fully opened. Do not climb a closed stepladder.  Make sure that both sides of the stepladder are locked into  place.  Ask someone to hold it for you, if possible.  Clearly mark and make sure that you can see:  Any grab bars or handrails.  First and last steps.  Where the edge of each step is.  Use tools that help you move around (mobility aids) if they are needed. These include:  Canes.  Walkers.  Scooters.  Crutches.  Turn on the lights when you go into a dark area. Replace any light bulbs as soon as they burn out.  Set up your furniture so you have a clear path. Avoid moving your furniture around.  If any of your floors are uneven, fix them.  If there are any pets around you, be aware of where they are.  Review your medicines with your doctor. Some medicines can make you feel dizzy. This can increase your chance of falling. Ask your doctor what other things that you can do to help prevent falls. This information is not intended to replace advice given to you by your health care provider. Make sure you discuss any questions you have with your health care provider. Document Released: 10/27/2008 Document Revised: 06/08/2015 Document Reviewed: 02/04/2014 Elsevier Interactive Patient Education  2017 [employer].

## 2018-07-23 ENCOUNTER — Other Ambulatory Visit: Payer: Self-pay

## 2018-07-23 ENCOUNTER — Encounter (INDEPENDENT_AMBULATORY_CARE_PROVIDER_SITE_OTHER): Payer: Medicare Other | Admitting: Ophthalmology

## 2018-07-23 DIAGNOSIS — H34813 Central retinal vein occlusion, bilateral, with macular edema: Secondary | ICD-10-CM | POA: Diagnosis not present

## 2018-07-23 DIAGNOSIS — I10 Essential (primary) hypertension: Secondary | ICD-10-CM | POA: Diagnosis not present

## 2018-07-23 DIAGNOSIS — H43813 Vitreous degeneration, bilateral: Secondary | ICD-10-CM | POA: Diagnosis not present

## 2018-07-23 DIAGNOSIS — H35033 Hypertensive retinopathy, bilateral: Secondary | ICD-10-CM

## 2018-07-31 LAB — HM DIABETES EYE EXAM

## 2018-08-03 ENCOUNTER — Other Ambulatory Visit (INDEPENDENT_AMBULATORY_CARE_PROVIDER_SITE_OTHER): Payer: Self-pay

## 2018-08-07 ENCOUNTER — Encounter: Payer: Self-pay | Admitting: Internal Medicine

## 2018-08-12 ENCOUNTER — Telehealth: Payer: Self-pay

## 2018-08-12 MED ORDER — TELMISARTAN 80 MG PO TABS: 80.0000 mg | ORAL_TABLET | Freq: Every day | ORAL | 0 refills | Status: DC

## 2018-08-12 NOTE — Telephone Encounter (Signed)
The pt was asked to clarify if she is taking telmisartan-hctz   80/25 for telmisartan 80 mg. The pt said that she is taking both but the prescription was split in to two separate prescriptions because [facility] was out of stock of the combined medications.

## 2018-08-13 LAB — HM DIABETES EYE EXAM

## 2018-08-18 ENCOUNTER — Encounter: Payer: Self-pay | Admitting: Internal Medicine

## 2018-08-18 ENCOUNTER — Other Ambulatory Visit: Payer: Self-pay | Admitting: Internal Medicine

## 2018-08-27 LAB — HM DIABETES EYE EXAM

## 2018-09-02 ENCOUNTER — Other Ambulatory Visit: Payer: Self-pay | Admitting: Internal Medicine

## 2018-09-03 ENCOUNTER — Encounter (INDEPENDENT_AMBULATORY_CARE_PROVIDER_SITE_OTHER): Payer: Medicare Other | Admitting: Ophthalmology

## 2018-09-03 ENCOUNTER — Other Ambulatory Visit: Payer: Self-pay

## 2018-09-03 DIAGNOSIS — I10 Essential (primary) hypertension: Secondary | ICD-10-CM

## 2018-09-03 DIAGNOSIS — H43813 Vitreous degeneration, bilateral: Secondary | ICD-10-CM | POA: Diagnosis not present

## 2018-09-03 DIAGNOSIS — H34813 Central retinal vein occlusion, bilateral, with macular edema: Secondary | ICD-10-CM | POA: Diagnosis not present

## 2018-09-03 DIAGNOSIS — H35033 Hypertensive retinopathy, bilateral: Secondary | ICD-10-CM

## 2018-09-09 ENCOUNTER — Encounter: Payer: Self-pay | Admitting: Internal Medicine

## 2018-09-22 ENCOUNTER — Ambulatory Visit (INDEPENDENT_AMBULATORY_CARE_PROVIDER_SITE_OTHER): Payer: Medicare Other | Admitting: Sports Medicine

## 2018-09-22 ENCOUNTER — Encounter: Payer: Self-pay | Admitting: Sports Medicine

## 2018-09-22 ENCOUNTER — Other Ambulatory Visit: Payer: Self-pay

## 2018-09-22 DIAGNOSIS — E1142 Type 2 diabetes mellitus with diabetic polyneuropathy: Secondary | ICD-10-CM

## 2018-09-22 DIAGNOSIS — N183 Chronic kidney disease, stage 3 unspecified: Secondary | ICD-10-CM

## 2018-09-22 DIAGNOSIS — M79675 Pain in left toe(s): Secondary | ICD-10-CM

## 2018-09-22 DIAGNOSIS — E1122 Type 2 diabetes mellitus with diabetic chronic kidney disease: Secondary | ICD-10-CM | POA: Diagnosis not present

## 2018-09-22 DIAGNOSIS — L84 Corns and callosities: Secondary | ICD-10-CM

## 2018-09-22 DIAGNOSIS — I739 Peripheral vascular disease, unspecified: Secondary | ICD-10-CM

## 2018-09-22 DIAGNOSIS — B351 Tinea unguium: Secondary | ICD-10-CM | POA: Diagnosis not present

## 2018-09-22 DIAGNOSIS — M79674 Pain in right toe(s): Secondary | ICD-10-CM

## 2018-09-22 NOTE — Progress Notes (Signed)
Subjective: Mary [name] is a 73 y.o. female patient with history of diabetes who presents to office today complaining of long,mildly painful nails and callus while ambulating in shoes; unable to trim. Patient admits some drainage from the right, states that the glucose reading this morning was110 and last A1c 5.1 like before, Patient denies any new changes in medication or new problems. Admits to swelling and has not been wearing compression stockings. No other issues noted.  Patient Active Problem List   Diagnosis Date Noted  . Diabetes mellitus with stage 3 chronic kidney disease ([place]) 02/21/2018  . 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 Primary osteoarthritis of both knees 02/21/2018  . Class 3 severe obesity due to excess calories with serious comorbidity and body mass index (BMI) of 45.0 to 49.9 in adult ([place]) 02/21/2018  . Skin ulcer of right foot with fat layer exposed ([place]) 02/18/2018  . Morbid (severe) obesity due to excess calories ([place]) 11/18/2017  . Knee pain 07/16/2015  . Lateral dislocation of right patella 05/04/2015  . Rupture of right quadriceps tendon 05/24/2014  . S/P TKR (total knee replacement) 05/05/2012   Current Outpatient Medications on File Prior to Visit  Medication Sig Dispense Refill  . ACCU-CHEK AVIVA PLUS test strip See admin instructions. for testing  11  . aspirin EC 81 MG tablet Take 81 mg by mouth daily.    [name] BESIVANCE 0.6 % SUSP     . Blood Glucose Monitoring Suppl (ACCU-CHEK AVIVA PLUS) w/Device KIT Use as directed to check blood sugars 2 times per day dx: e11.65 1 kit 1  . cetirizine (ZYRTEC) 10 MG tablet TAKE 1 TABLET BY MOUTH EVERY DAY 90 tablet 1  . fluticasone (FLONASE) 50 MCG/ACT nasal spray Place 2 sprays into the nose daily. (Patient taking differently: Place 2 sprays into the nose daily as needed for allergies. ) 16 g 0  . furosemide (LASIX) 40 MG tablet Take 1 tablet (40 mg total) by mouth daily. 90 tablet 1  . KLOR-CON M20 20 MEQ  tablet Take 20 mEq by mouth daily. with food  1  . levothyroxine (SYNTHROID, LEVOTHROID) 112 MCG tablet Take 112 mcg by mouth daily before breakfast.     . Magnesium Oxide -Mg Supplement 400 MG CAPS TAKE 1 CAPSULE BY MOUTH EVERY DAY IN THE EVENING    . metoprolol succinate (TOPROL-XL) 50 MG 24 hr tablet Take 50 mg by mouth daily.  6  . omeprazole (PRILOSEC) 20 MG capsule TAKE 1 CAPSULE BY MOUTH  DAILY 90 capsule 1  . oxyCODONE-acetaminophen (PERCOCET) 7.5-325 MG tablet Take 1 tablet by mouth every 4 (four) hours as needed. pain 30 tablet 0  . OZEMPIC, 0.25 OR 0.5 MG/DOSE, 2 MG/1.5ML SOPN Inject 0.5 mg into the skin once a week. 3 pen 2  . pravastatin (PRAVACHOL) 40 MG tablet TAKE 1 TABLET BY MOUTH EVERY DAY IN THE EVENING 90 tablet 1  . telmisartan (MICARDIS) 80 MG tablet Take 1 tablet (80 mg total) by mouth daily. 90 tablet 0   No current facility-administered medications on file prior to visit.    No Known Allergies  Recent Results (from the past 2160 hour(s))  HM DIABETES EYE EXAM     Status: Abnormal   Collection Time: 07/31/18 12:00 AM  Result Value Ref Range   HM Diabetic Eye Exam Retinopathy (A) No Retinopathy  HM DIABETES EYE EXAM     Status: Abnormal   Collection Time: 08/13/18 12:00 AM  Result Value Ref Range  HM Diabetic Eye Exam Retinopathy (A) No Retinopathy  HM DIABETES EYE EXAM     Status: Abnormal   Collection Time: 08/27/18 12:00 AM  Result Value Ref Range   HM Diabetic Eye Exam Retinopathy (A) No Retinopathy    Objective: General: Patient is awake, alert, and oriented x 3 and in no acute distress.  Integument: Skin is warm, dry and supple bilateral. Nails are tender, long, thickened and  dystrophic with subungual debris, consistent with onychomycosis, 1-5 bilateral. No signs of infection. + sub met 1 & 5 preulcerative lesions present bilateral. Remaining integument unremarkable.  Vasculature:  Dorsalis Pedis pulse 1/4 bilateral. Posterior Tibial pulse  0/4  bilateral.  Capillary fill time <3 sec 1-5 bilateral. Scant hair growth to the level of the digits. Temperature gradient within normal limits. No varicosities present bilateral. Chronic edema present bilateral.   Neurology: The patient has absent sensation measured with a 5.07/10g Semmes Weinstein Monofilament at all pedal sites bilateral . Vibratory sensation absent bilateral with tuning fork. No Babinski sign present bilateral.   Musculoskeletal: Asymptomatic bunion, pes planus, and hammertoe pedal deformities noted bilateral. Muscular strength 4/5 in all lower extremity muscular groups bilateral without pain on range of motion . No tenderness with calf compression bilateral.  Assessment and Plan: Problem List Items Addressed This Visit    None    Visit Diagnoses    Pain due to onychomycosis of toenails of both feet    -  Primary   Pre-ulcerative calluses       Diabetic polyneuropathy associated with type 2 diabetes mellitus (HCC)       PVD (peripheral vascular disease) ([place])          -Examined patient. -Discussed and educated patient on diabetic foot care, especially with  regards to the vascular, neurological and musculoskeletal systems. t. -Mechanically debrided  Callus x 4 using sterile chisel blade and all nails 1-5 bilateral using sterile nail nipper and filed with dremel without incident  -Previous right foot ulcer is healed advised to monitor if bleeding recurs to apply betadine and return to office for recheck -May use compression garment daily  -Answered all patient questions -Patient to return  in 9-10 weeks for at risk foot care -Patient advised to call the office if any problems or questions arise in the meantime.  Landis Martins, DPM

## 2018-09-29 ENCOUNTER — Other Ambulatory Visit: Payer: Self-pay | Admitting: Internal Medicine

## 2018-10-05 HISTORY — PX: CATARACT EXTRACTION: SUR2

## 2018-10-09 ENCOUNTER — Encounter (INDEPENDENT_AMBULATORY_CARE_PROVIDER_SITE_OTHER): Payer: Medicare Other | Admitting: Ophthalmology

## 2018-10-16 ENCOUNTER — Other Ambulatory Visit: Payer: Self-pay | Admitting: Internal Medicine

## 2018-10-29 ENCOUNTER — Other Ambulatory Visit: Payer: Self-pay

## 2018-10-29 MED ORDER — PRAVASTATIN SODIUM 40 MG PO TABS: 40.0000 mg | ORAL_TABLET | Freq: Every day | ORAL | 1 refills | Status: DC

## 2018-11-05 ENCOUNTER — Ambulatory Visit: Payer: Medicare Other

## 2018-11-05 ENCOUNTER — Ambulatory Visit: Payer: Medicare Other | Admitting: Internal Medicine

## 2018-11-09 ENCOUNTER — Encounter (INDEPENDENT_AMBULATORY_CARE_PROVIDER_SITE_OTHER): Payer: Self-pay | Admitting: Ophthalmology

## 2018-11-09 ENCOUNTER — Ambulatory Visit (INDEPENDENT_AMBULATORY_CARE_PROVIDER_SITE_OTHER): Payer: Medicare Other | Admitting: Ophthalmology

## 2018-11-09 DIAGNOSIS — I10 Essential (primary) hypertension: Secondary | ICD-10-CM | POA: Diagnosis not present

## 2018-11-09 DIAGNOSIS — H25812 Combined forms of age-related cataract, left eye: Secondary | ICD-10-CM

## 2018-11-09 DIAGNOSIS — E113313 Type 2 diabetes mellitus with moderate nonproliferative diabetic retinopathy with macular edema, bilateral: Secondary | ICD-10-CM

## 2018-11-09 DIAGNOSIS — H3581 Retinal edema: Secondary | ICD-10-CM | POA: Diagnosis not present

## 2018-11-09 DIAGNOSIS — H401111 Primary open-angle glaucoma, right eye, mild stage: Secondary | ICD-10-CM

## 2018-11-09 DIAGNOSIS — H34813 Central retinal vein occlusion, bilateral, with macular edema: Secondary | ICD-10-CM

## 2018-11-09 DIAGNOSIS — Z961 Presence of intraocular lens: Secondary | ICD-10-CM

## 2018-11-09 DIAGNOSIS — H35033 Hypertensive retinopathy, bilateral: Secondary | ICD-10-CM

## 2018-11-09 MED ORDER — TRIAMCINOLONE ACETONIDE 40 MG/ML IO SUSP
4.0000 mg | INTRAOCULAR | Status: AC | PRN
  Administered 2018-11-09: 13:00:00 4 mg via INTRAVITREAL

## 2018-11-09 NOTE — Progress Notes (Signed)
[facility] Note  11/09/2018     CHIEF COMPLAINT Patient presents for Retina Evaluation   HISTORY OF PRESENT ILLNESS: Mary [name] is a 72 y.o. female who presents to the clinic today for:   HPI    Retina Evaluation    In both eyes.  This started 1 year ago.  Associated Symptoms Negative for Flashes, Pain, Trauma, Fever, Weight Loss, Scalp Tenderness, Redness, Floaters, Distortion, Photophobia, Jaw Claudication, Fatigue, Glare, Blind Spot and Shoulder/Hip pain.  Context:  distance vision, mid-range vision and near vision.  Treatments tried include injection and surgery.  Response to treatment was mild improvement.  I, the attending physician,  performed the HPI with the patient and updated documentation appropriately.          Comments    Previous patient of Dr. Zigmund Daniel, Patient states she had cataract sx with Dr. 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 . Pt is DM2 x 27 yrs, Bs are controlled by insulin. Bs 87 this am, A1C 5.1 (06/2018).Pt is using Combigan Itd os, Rockatan Hs od, Dorozamine HCl tid OD.        Last edited by Bernarda Caffey, MD on 11/09/2018  1:23 PM. (History)    pt is a previous Zigmund Daniel' pt, she states she wishes to transfer care, pt also sees Dr. 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 BESIVANCE 0.6 % SUSP    No current facility-administered medications for this visit.  (Ophthalmic Drugs)   Current Outpatient Medications (Other)  Medication Sig  .  ACCU-CHEK AVIVA PLUS test strip See admin instructions. for testing  . aspirin EC 81 MG tablet Take 81 mg by mouth daily.  . Blood Glucose Monitoring Suppl (ACCU-CHEK AVIVA PLUS) w/Device KIT Use as directed to check blood sugars 2 times per day dx: e11.65  . cetirizine (ZYRTEC) 10 MG tablet TAKE 1 TABLET BY MOUTH EVERY DAY  . fluticasone (FLONASE) 50 MCG/ACT nasal spray Place 2 sprays into the nose daily. (Patient taking differently: Place 2 sprays into the nose daily as needed for allergies. )  . furosemide (LASIX) 40 MG tablet TAKE 1 TABLET BY MOUTH EVERY DAY  . KLOR-CON M20 20 MEQ tablet Take 20 mEq by mouth daily. with food  . levothyroxine (SYNTHROID, LEVOTHROID) 112 MCG tablet Take 112 mcg by mouth daily before breakfast.   . Magnesium Oxide -Mg Supplement 400 MG CAPS TAKE 1 CAPSULE BY MOUTH EVERY DAY IN THE EVENING  . metoprolol succinate (TOPROL-XL) 50 MG 24 hr tablet Take 50 mg by mouth daily.  [name] omeprazole (PRILOSEC) 20 MG capsule TAKE 1 CAPSULE BY MOUTH  DAILY  . oxyCODONE-acetaminophen (PERCOCET) 7.5-325 MG tablet Take 1 tablet by mouth every 4 (four) hours as needed. pain  . OZEMPIC, 0.25 OR 0.5 MG/DOSE, 2 MG/1.5ML SOPN Inject 0.5 mg into the skin once a week.  . pravastatin (PRAVACHOL) 40 MG tablet Take 1 tablet (40 mg total) by mouth daily.  [name] telmisartan (MICARDIS) 80 MG tablet Take  1 tablet (80 mg total) by mouth daily.   No current facility-administered medications for this visit.  (Other)      REVIEW OF SYSTEMS: ROS    Positive for: Musculoskeletal, Endocrine, Eyes   Negative for: Constitutional, Gastrointestinal, Neurological, Skin, Genitourinary, HENT, Cardiovascular, Respiratory, Psychiatric, Allergic/Imm, Heme/Lymph   Last edited by Zenovia Jordan, LPN on 37/62/8315  1:76 AM. (History)       ALLERGIES No Known Allergies  PAST MEDICAL HISTORY Past Medical History:  Diagnosis Date  . Arthritis    osteoarthritis  . Diabetes mellitus    Insulin dependent   . H/O cardiovascular stress test    pt. reports 10/13- was normal per pt.   . Hyperlipemia   . Hypertension    Past Surgical History:  Procedure Laterality Date  . CARPAL TUNNEL RELEASE Bilateral   . KNEE ARTHROSCOPY     right  . QUADRICEPS TENDON REPAIR  02/17/2012   Procedure: REPAIR QUADRICEP TENDON;  Surgeon: Vickey Huger, MD;  Location: [facility];  Service: Orthopedics;  Laterality: Right;  right quadriceps repair with latera release  . QUADRICEPS TENDON REPAIR Right 05/15/2012   Procedure: REPAIR QUADRICEP TENDON;  Surgeon: Vickey Huger, MD;  Location: [facility];  Service: Orthopedics;  Laterality: Right;  . TOTAL KNEE ARTHROPLASTY  10/21/2011   rt tk  . TOTAL KNEE ARTHROPLASTY  10/21/2011   Procedure: TOTAL KNEE ARTHROPLASTY;  Surgeon: Rudean Haskell, MD;  Location: [facility];  Service: Orthopedics;  Laterality: Right;  . TUBAL LIGATION      FAMILY HISTORY Family History  Problem Relation Age of Onset  . Hypertension Mother   . Heart Problems Father   . Gout Father   . Arthritis Father     SOCIAL HISTORY Social History   Tobacco Use  . Smoking status: Former Smoker    Packs/day: 0.25    Years: 20.00    Pack years: 5.00    Types: Cigarettes    Quit date: 06/28/2014    Years since quitting: 4.3  . Smokeless tobacco: Never Used  Substance Use Topics  . Alcohol use: No  . Drug use: Yes    Types: Oxycodone    Comment: Uses as needed for pain         OPHTHALMIC EXAM:  Base Eye Exam    Visual Acuity (Snellen - Linear)      Right Left   Dist cc 20/80 -2 HM   Dist ph cc NI    Correction: Glasses       Tonometry (Tonopen, 9:27 AM)      Right Left   Pressure 24 18       Tonometry #2 (Tonopen, 10:07 AM)      Right Left   Pressure 11 15       Pupils      Dark Light Shape React APD   Right 3 2 Round Brisk None   Left 3  Round  None       Visual Fields (Counting fingers)      Left Right     Full   Restrictions Total superior temporal, inferior temporal,  superior nasal, inferior nasal deficiencies        Extraocular Movement      Right Left    Full, Ortho Full, Ortho       Neuro/Psych    Oriented x3: Yes       Dilation    Both eyes: 1.0% Mydriacyl, 2.5% Phenylephrine @ 9:27 AM  Slit Lamp and Fundus Exam    Slit Lamp Exam      Right Left   Lids/Lashes Dermatochalasis - upper lid, mild Meibomian gland dysfunction Dermatochalasis - upper lid, mild Meibomian gland dysfunction   Conjunctiva/Sclera Melanosis Melanosis   Cornea 2-3+ Punctate epithelial erosions, well healed temporal cataract wounds 3+ Punctate epithelial erosions, well healed temporal cataract wounds   Anterior Chamber Deep and quiet Deep and quiet   Iris Round and moderately dilated to 5.75m Round and moderately dilated to 670m  Lens PC IOL in good position, trace, cortical remnant temporally, PC folds 2-3+ Nuclear sclerosis, 2-3+ Cortical cataract   Vitreous Vitreous syneresis, vitreous condensations, Ozurdex remnants inferiorly Vitreous syneresis       Fundus Exam      Right Left   Disc Trace Pallor, Sharp rim Mild Pallor, Sharp rim, PPP   C/D Ratio 0.4 0.3   Macula Blunted foveal reflex, [place], Retinal pigment epithelial mottling, diffuse edema Flat, Blunted foveal reflex, RPE mottling and clumping, scarring   Vessels Vascular attenuation, Tortuous Severe Vascular attenuation, +fibrosis   Periphery Attached    Attached, pavingstone and reticular degeneration inferiorly          IMAGING AND PROCEDURES  Imaging and Procedures for @TODAY @  OCT, Retina - OU - Both Eyes       Right Eye Quality was good. Central Foveal Thickness: 475. Progression has improved. Findings include abnormal foveal contour, intraretinal fluid, intraretinal hyper-reflective material, no SRF, vitreomacular adhesion .   Left Eye Quality was good. Central Foveal Thickness: 225. Progression has been stable. Findings include abnormal foveal contour, no IRF, no SRF, outer retinal  atrophy, epiretinal membrane, preretinal fibrosis, subretinal hyper-reflective material.   Notes *Images captured and stored on drive  Diagnosis / Impression:  OD: diffuse macular edema - slight improvement from prior OS: diffuse ORA  Clinical management:  See below  Abbreviations: NFP - Normal foveal profile. CME - cystoid macular edema. PED - pigment epithelial detachment. IRF - intraretinal fluid. SRF - subretinal fluid. EZ - ellipsoid zone. ERM - epiretinal membrane. ORA - outer retinal atrophy. ORT - outer retinal tubulation. SRHM - subretinal hyper-reflective material        Intravitreal Injection, Pharmacologic Agent - OD - Right Eye       Time Out 11/09/2018. 10:26 AM. Confirmed correct patient, procedure, site, and patient consented.   Anesthesia Topical anesthesia was used. Anesthetic medications included Lidocaine 2%, Proparacaine 0.5%.   Procedure Preparation included 5% betadine to ocular surface, eyelid speculum. A 27 gauge needle was used.   Injection:  4 mg Triescence 4027mlL injection   NDC: 006820-410-8994ot: 103y3, Expiration date: 05/03/2020   Route: Intravitreal, Site: Right Eye, Waste: 0.9 mL  Post-op Post injection exam found visual acuity of at least counting fingers. The patient tolerated the procedure well. There were no complications. The patient received written and verbal post procedure care education.   Notes An AC tap was performed following injection due to elevated IOP using a 30 gauge needle on a syringe with the plunger removed. The needle was placed at the limbus at 5 oclock and approximately 0.07cc of aqueous was removed from the anterior chamber. Betadine was applied to the tap area before and after the paracentesis was performed. There were no complications. The patient tolerated the procedure well. The IOP was rechecked and was found to be 9 mmHg by tonopen.  ASSESSMENT/PLAN:    ICD-10-CM   1. 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 Moderate nonproliferative diabetic retinopathy of both eyes with macular edema associated with type 2 diabetes mellitus (HCC)  B14.7829 Intravitreal Injection, Pharmacologic Agent - OD - Right Eye    triamcinolone acetonide (TRIESENCE) 40 MG/ML subtenons injection 4 mg  3. Retinal edema  H35.81 OCT, Retina - OU - Both Eyes  4. Essential hypertension  I10   5. Hypertensive retinopathy of both eyes  H35.033   6. Combined forms of age-related cataract of left eye  H25.812   7. Pseudophakia  Z96.1   8. Primary open angle glaucoma (POAG) of right eye, mild stage  H40.1111     1. CRVO w/ CME OU  - pt of Dr. Zigmund Daniel who wished to transfer care  - OS long-standing low vision (HM) with subfoveal scar and central retinal atrophy  - OD with CME actively being managed with Ozurdex OD ~q5 wks by Dr. Zigmund Daniel (last on 8.20.20) s/p multiple IVTA and Ozurdex OD  - The natural history of retinal vein occlusion and macular edema and treatment options including observation, laser photocoagulation, and intravitreal antiVEGF injection with Avastin and Lucentis and Eylea and intravitreal injection of steroids with triamcinolone and Ozurdex and the complications of these procedures including loss of vision, infection, cataract, glaucoma, and retinal detachment were discussed with patient.  - Specifically discussed findings from [place] / [place] study regarding patient stabilization with anti-VEGF agents and increased potential for visual improvements.  Also discussed need for frequent follow up and potentially multiple injections given the chronic nature of the disease process  - today BCVA OD 20/80  - OCT shows diffuse macular edema slightly improved from prior -- likely multifactorial -- CRVO, DME, post-cataract CME  - was due  for Ozurdex OD w/ Zigmund Daniel on 9.25.20, but underwent cataract surgery OD w/ Kathlen Mody on 9.21.20  - recommend IVTA OD #4, 10.26.20  - pt wishes to proceed  - RBA of procedure discussed, questions answered  - informed consent obtained and signed  - see procedure note  - f/u in 6 wks  2,3. Severe Non-proliferative diabetic retinopathy OU  - The incidence, risk factors for progression, natural history and treatment options for diabetic retinopathy  were discussed with patient.    - The need for close monitoring of blood glucose, blood pressure, and serum lipids, avoiding cigarette or any type of tobacco, and the need for long term follow up was also discussed with patient.  - OCT shows diabetic macular edema, OD   - recommend IVTA OD today, 10.26.20 as above for CRVO  4,5. Hypertensive retinopathy OU  - discussed importance of tight BP control  - monitor  6. Mixed form age related cataract OS  - under the expert management of Dr. Kathlen Mody  7. Pseudophakia OD  - s/p CE/IOL OD (9.21.20) by expert surgeon, Dr. Kathlen Mody  - IOL in excellent position  - had post op IOP spike -- IOP now under better control (11 by MD check)  - monitor  8. POAG OD  - under the expert management of Dr. Kathlen Mody  - s/p SLT and goniotomy OD  - had post-cataract IOP spike  - IOP 11 today and did post-injection AC tap OD after injecting Triesence   Ophthalmic Meds Ordered this visit:  Meds ordered  this encounter  Medications  . triamcinolone acetonide (TRIESENCE) 40 MG/ML subtenons injection 4 mg       Return in about 6 weeks (around 12/21/2018) for f/u NPDR OD, DFE, OCT.  There are no Patient Instructions on file for this visit.   Explained the diagnoses, plan, and follow up with the patient and they expressed understanding.  Patient expressed understanding of the importance of proper follow up care.   Gardiner Sleeper, M.D., Ph.D. Diseases & Surgery of the Retina and Vitreous [facility]   I have reviewed the above documentation for accuracy and completeness, and I agree with the above. Gardiner Sleeper, M.D., Ph.D. 11/11/18 5:36 PM    Abbreviations: M myopia (nearsighted); A astigmatism; H hyperopia (farsighted); P presbyopia; Mrx spectacle prescription;  CTL contact lenses; OD right eye; OS left eye; OU both eyes  XT exotropia; ET esotropia; PEK punctate epithelial keratitis; PEE punctate epithelial erosions; DES dry eye syndrome; MGD meibomian gland dysfunction; ATs artificial tears; PFAT's preservative free artificial tears; [place] nuclear sclerotic cataract; PSC posterior subcapsular cataract; ERM epi-retinal membrane; PVD posterior vitreous detachment; RD retinal detachment; DM diabetes mellitus; DR diabetic retinopathy; NPDR non-proliferative diabetic retinopathy; PDR proliferative diabetic retinopathy; CSME clinically significant macular edema; DME diabetic macular edema; dbh dot blot hemorrhages; CWS cotton wool spot; POAG primary open angle glaucoma; C/D cup-to-disc ratio; HVF humphrey visual field; GVF goldmann visual field; OCT optical coherence tomography; IOP intraocular pressure; BRVO Branch retinal vein occlusion; CRVO central retinal vein occlusion; CRAO central retinal artery occlusion; BRAO branch retinal artery occlusion; RT retinal tear; SB scleral buckle; PPV pars plana vitrectomy; VH Vitreous hemorrhage; PRP panretinal laser photocoagulation; IVK intravitreal kenalog; VMT vitreomacular traction; MH Macular hole;  NVD neovascularization of the disc; NVE neovascularization elsewhere; AREDS age related eye disease study; ARMD age related macular degeneration; POAG primary open angle glaucoma; EBMD epithelial/anterior basement membrane dystrophy; ACIOL anterior chamber intraocular lens; IOL intraocular lens; PCIOL posterior chamber intraocular lens; Phaco/IOL phacoemulsification with intraocular lens placement; [place] photorefractive keratectomy; LASIK laser assisted in  situ keratomileusis; HTN hypertension; DM diabetes mellitus; COPD chronic obstructive pulmonary disease

## 2018-11-11 ENCOUNTER — Ambulatory Visit: Payer: Medicare Other | Admitting: Internal Medicine

## 2018-11-16 ENCOUNTER — Encounter: Payer: Self-pay | Admitting: Internal Medicine

## 2018-11-16 ENCOUNTER — Ambulatory Visit (INDEPENDENT_AMBULATORY_CARE_PROVIDER_SITE_OTHER): Payer: Medicare Other | Admitting: Internal Medicine

## 2018-11-16 ENCOUNTER — Other Ambulatory Visit: Payer: Self-pay

## 2018-11-16 VITALS — BP 138/96 | HR 69 | Temp 98.2°F | Ht 67.0 in | Wt 300.0 lb

## 2018-11-16 DIAGNOSIS — Z23 Encounter for immunization: Secondary | ICD-10-CM

## 2018-11-16 DIAGNOSIS — E1122 Type 2 diabetes mellitus with diabetic chronic kidney disease: Secondary | ICD-10-CM

## 2018-11-16 DIAGNOSIS — R0982 Postnasal drip: Secondary | ICD-10-CM

## 2018-11-16 DIAGNOSIS — E7800 Pure hypercholesterolemia, unspecified: Secondary | ICD-10-CM

## 2018-11-16 DIAGNOSIS — I129 Hypertensive chronic kidney disease with stage 1 through stage 4 chronic kidney disease, or unspecified chronic kidney disease: Secondary | ICD-10-CM | POA: Diagnosis not present

## 2018-11-16 DIAGNOSIS — N183 Chronic kidney disease, stage 3 unspecified: Secondary | ICD-10-CM

## 2018-11-16 DIAGNOSIS — Z6841 Body Mass Index (BMI) 40.0 and over, adult: Secondary | ICD-10-CM

## 2018-11-16 NOTE — Patient Instructions (Signed)
Exercises To Do While Sitting  Exercises that you do while sitting (chair exercises) can give you many of the same benefits as full exercise. Benefits include strengthening your heart, burning calories, and keeping muscles and joints healthy. Exercise can also improve your mood and help with depression and anxiety. You may benefit from chair exercises if you are unable to do standing exercises because of:  Diabetic foot pain.  Obesity.  Illness.  Arthritis.  Recovery from surgery or injury.  Breathing problems.  Balance problems.  Another type of disability. Before starting chair exercises, check with your health care provider or a physical therapist to find out how much exercise you can tolerate and which exercises are safe for you. If your health care provider approves:  Start out slowly and build up over time. Aim to work up to about 10-20 minutes for each exercise session.  Make exercise part of your daily routine.  Drink water when you exercise. Do not wait until you are thirsty. Drink every 10-15 minutes.  Stop exercising right away if you have pain, nausea, shortness of breath, or dizziness.  If you are exercising in a wheelchair, make sure to lock the wheels.  Ask your health care provider whether you can do tai chi or yoga. Many positions in these mind-body exercises can be modified to do while seated. Warm-up Before starting other exercises: 1. Sit up as straight as you can. Have your knees bent at 90 degrees, which is the shape of the capital letter "L." Keep your feet flat on the floor. 2. Sit at the front edge of your chair, if you can. 3. Pull in (tighten) the muscles in your abdomen and stretch your spine and neck as straight as you can. Hold this position for a few minutes. 4. Breathe in and out evenly. Try to concentrate on your breathing, and relax your mind. Stretching Exercise A: Arm stretch 1. Hold your arms out straight in front of your body. 2. Bend  your hands at the wrist with your fingers pointing up, as if signaling someone to stop. Notice the slight tension in your forearms as you hold the position. 3. Keeping your arms out and your hands bent, rotate your hands outward as far as you can and hold this stretch. Aim to have your thumbs pointing up and your pinkie fingers pointing down. Slowly repeat arm stretches for one minute as tolerated. Exercise B: Leg stretch 1. If you can move your legs, try to "draw" letters on the floor with the toes of your foot. Write your name with one foot. 2. Write your name with the toes of your other foot. Slowly repeat the movements for one minute as tolerated. Exercise C: Reach for the sky 1. Reach your hands as far over your head as you can to stretch your spine. 2. Move your hands and arms as if you are climbing a rope. Slowly repeat the movements for one minute as tolerated. Range of motion exercises Exercise A: Shoulder roll 1. Let your arms hang loosely at your sides. 2. Lift just your shoulders up toward your ears, then let them relax back down. 3. When your shoulders feel loose, rotate your shoulders in backward and forward circles. Do shoulder rolls slowly for one minute as tolerated. Exercise B: March in place 1. As if you are marching, pump your arms and lift your legs up and down. Lift your knees as high as you can. ? If you are unable to lift your knees,  just pump your arms and move your ankles and feet up and down. March in place for one minute as tolerated. Exercise C: Seated jumping jacks 1. Let your arms hang down straight. 2. Keeping your arms straight, lift them up over your head. Aim to point your fingers to the ceiling. 3. While you lift your arms, straighten your legs and slide your heels along the floor to your sides, as wide as you can. 4. As you bring your arms back down to your sides, slide your legs back together. ? If you are unable to use your legs, just move your arms.  Slowly repeat seated jumping jacks for one minute as tolerated. Strengthening exercises Exercise A: Shoulder squeeze 1. Hold your arms straight out from your body to your sides, with your elbows bent and your fists pointed at the ceiling. 2. Keeping your arms in the bent position, move them forward so your elbows and forearms meet in front of your face. 3. Open your arms back out as wide as you can with your elbows still bent, until you feel your shoulder blades squeezing together. Hold for 5 seconds. Slowly repeat the movements forward and backward for one minute as tolerated. Contact a health care provider if you:  Had to stop exercising due to any of the following: ? Pain. ? Nausea. ? Shortness of breath. ? Dizziness. ? Fatigue.  Have significant pain or soreness after exercising. Get help right away if you have:  Chest pain.  Difficulty breathing. These symptoms may represent a serious problem that is an emergency. Do not wait to see if the symptoms will go away. Get medical help right away. Call your local emergency services (911 in the [place] Do not drive yourself to the hospital. This information is not intended to replace advice given to you by your health care provider. Make sure you discuss any questions you have with your health care provider. Document Released: 11/13/2016 Document Revised: 04/23/2018 Document Reviewed: 11/13/2016 Elsevier Patient Education  2020 [employer].

## 2018-11-16 NOTE — Progress Notes (Signed)
Subjective:     Patient ID: Mary [name] , female    DOB: 29-May-1945 , 73 y.o.   MRN: [id number]   Chief Complaint  Patient presents with  . Diabetes  . Immunizations    high dose flu  . Hypertension    HPI  Diabetes She presents for her follow-up diabetic visit. She has type 2 diabetes mellitus. Her disease course has been stable. There are no hypoglycemic associated symptoms. Pertinent negatives for hypoglycemia include no headaches. Pertinent negatives for diabetes include no blurred vision and no chest pain. There are no hypoglycemic complications. Diabetic complications include nephropathy. Risk factors for coronary artery disease include diabetes mellitus, dyslipidemia, hypertension, obesity, post-menopausal and sedentary lifestyle. She is following a diabetic diet. She never participates in exercise.  Hypertension This is a chronic problem. The current episode started more than 1 year ago. Pertinent negatives include no blurred vision, chest pain, headaches, orthopnea, palpitations or shortness of breath. Risk factors for coronary artery disease include diabetes mellitus, dyslipidemia, post-menopausal state and sedentary lifestyle. The current treatment provides moderate improvement. Compliance problems include exercise.  Hypertensive end-organ damage includes kidney disease.     Past Medical History:  Diagnosis Date  . Arthritis    osteoarthritis  . Diabetes mellitus    Insulin dependent  . H/O cardiovascular stress test    pt. reports 10/13- was normal per pt.   . Hyperlipemia   . Hypertension      Family History  Problem Relation Age of Onset  . Hypertension Mother   . Heart Problems Father   . Gout Father   . Arthritis Father      Current Outpatient Medications:  .  ACCU-CHEK AVIVA PLUS test strip, See admin instructions. for testing, Disp: , Rfl: 11 .  aspirin EC 81 MG tablet, Take 81 mg by mouth daily., Disp: , Rfl:  .  Blood Glucose Monitoring Suppl  (ACCU-CHEK AVIVA PLUS) w/Device KIT, Use as directed to check blood sugars 2 times per day dx: e11.65, Disp: 1 kit, Rfl: 1 .  cetirizine (ZYRTEC) 10 MG tablet, TAKE 1 TABLET BY MOUTH EVERY DAY, Disp: 90 tablet, Rfl: 1 .  fluticasone (FLONASE) 50 MCG/ACT nasal spray, Place 2 sprays into the nose daily. (Patient taking differently: Place 2 sprays into the nose daily as needed for allergies. ), Disp: 16 g, Rfl: 0 .  furosemide (LASIX) 40 MG tablet, TAKE 1 TABLET BY MOUTH EVERY DAY, Disp: 90 tablet, Rfl: 1 .  levothyroxine (SYNTHROID, LEVOTHROID) 112 MCG tablet, Take 112 mcg by mouth daily before breakfast. , Disp: , Rfl:  .  Magnesium Oxide -Mg Supplement 400 MG CAPS, TAKE 1 CAPSULE BY MOUTH EVERY DAY IN THE EVENING, Disp: 90 capsule, Rfl: 1 .  metoprolol succinate (TOPROL-XL) 50 MG 24 hr tablet, Take 50 mg by mouth daily., Disp: , Rfl: 6 .  omeprazole (PRILOSEC) 20 MG capsule, TAKE 1 CAPSULE BY MOUTH  DAILY, Disp: 90 capsule, Rfl: 3 .  oxyCODONE-acetaminophen (PERCOCET) 7.5-325 MG tablet, Take 1 tablet by mouth every 4 (four) hours as needed. pain, Disp: 30 tablet, Rfl: 0 .  OZEMPIC, 0.25 OR 0.5 MG/DOSE, 2 MG/1.5ML SOPN, Inject 0.5 mg into the skin once a week., Disp: 3 pen, Rfl: 2 .  pravastatin (PRAVACHOL) 40 MG tablet, Take 1 tablet (40 mg total) by mouth daily., Disp: 90 tablet, Rfl: 1 .  telmisartan (MICARDIS) 80 MG tablet, Take 1 tablet (80 mg total) by mouth daily., Disp: 90 tablet, Rfl: 0 .  KLOR-CON  M20 20 MEQ tablet, Take 20 mEq by mouth daily. with food, Disp: , Rfl: 1   No Known Allergies   Review of Systems  Constitutional: Negative.   Eyes: Negative for blurred vision.  Respiratory: Negative.  Negative for shortness of breath.   Cardiovascular: Negative.  Negative for chest pain, palpitations and orthopnea.  Gastrointestinal: Negative.   Neurological: Negative.  Negative for headaches.  Psychiatric/Behavioral: Negative.      Today's Vitals   11/16/18 0857  BP: (!) 138/96   Pulse: 69  Temp: 98.2 F (36.8 C)  TempSrc: Oral  Weight: 300 lb (136.1 kg)  Height: 5' 7"  (1.702 m)  PainSc: 0-No pain   Body mass index is 46.99 kg/m.   Objective:  Physical Exam Vitals signs and nursing note reviewed.  Constitutional:      Appearance: Normal appearance. She is obese.  HENT:     Head: Normocephalic and atraumatic.  Cardiovascular:     Rate and Rhythm: Normal rate and regular rhythm.     Heart sounds: Normal heart sounds.  Pulmonary:     Effort: Pulmonary effort is normal.     Breath sounds: Normal breath sounds.  Skin:    General: Skin is warm.  Neurological:     General: No focal deficit present.     Mental Status: She is alert.  Psychiatric:        Mood and Affect: Mood normal.        Behavior: Behavior normal.         Assessment And Plan:     1. Diabetes mellitus with stage 3 chronic kidney disease (HCC)  I will not check an a1c today, last a1c was 5.1.  I will check CMP along with a fasting lipid panel today. She is encouraged to avoid sugary beverages.   - Lipid panel - CMP14+EGFR  2. Parenchymal renal hypertension, stage 1 through stage 4 or unspecified chronic kidney disease  Chronic, fair control. She is encouraged to avoid adding salt to her foods. She will continue with current meds for now.   3. Postnasal drip  She was given samples of Allegra to take once daily in PLACE of the cetirizine. She agrees to call me next week to let me know how she feels with this medication.   4. Pure hypercholesterolemia  I will check fasting lipid panel and LFTs.  She is encouraged to avoid fried foods and to increase her daily activity.   5. Need for vaccination  - Flu vaccine HIGH DOSE PF (Fluzone High dose)  6. Class 3 severe obesity due to excess calories with serious comorbidity and body mass index (BMI) of 45.0 to 49.9 in adult [facility])  She is encouraged to perform chair exercises five days per week. She was given some information to  read at her leisure.         Maximino Greenland, MD    THE PATIENT IS ENCOURAGED TO PRACTICE SOCIAL DISTANCING DUE TO THE COVID-19 PANDEMIC.

## 2018-11-17 LAB — CMP14+EGFR
ALT: 9 IU/L (ref 0–32)
AST: 12 IU/L (ref 0–40)
Albumin/Globulin Ratio: 1.4 (ref 1.2–2.2)
Albumin: 4.5 g/dL (ref 3.8–4.8)
Alkaline Phosphatase: 120 IU/L — ABNORMAL HIGH (ref 39–117)
BUN/Creatinine Ratio: 23 (ref 12–28)
BUN: 36 mg/dL — ABNORMAL HIGH (ref 8–27)
Bilirubin Total: 0.6 mg/dL (ref 0.0–1.2)
CO2: 25 mmol/L (ref 20–29)
Calcium: 9.7 mg/dL (ref 8.7–10.3)
Chloride: 105 mmol/L (ref 96–106)
Creatinine, Ser: 1.55 mg/dL — ABNORMAL HIGH (ref 0.57–1.00)
GFR calc Af Amer: 39 mL/min/{1.73_m2} — ABNORMAL LOW (ref >59)
GFR calc non Af Amer: 34 mL/min/{1.73_m2} — ABNORMAL LOW (ref >59)
Globulin, Total: 3.3 g/dL (ref 1.5–4.5)
Glucose: 87 mg/dL (ref 65–99)
Potassium: 5 mmol/L (ref 3.5–5.2)
Sodium: 141 mmol/L (ref 134–144)
Total Protein: 7.8 g/dL (ref 6.0–8.5)

## 2018-11-17 LAB — LIPID PANEL
Chol/HDL Ratio: 2.1 ratio (ref 0.0–4.4)
Cholesterol, Total: 152 mg/dL (ref 100–199)
HDL: 73 mg/dL (ref >39)
LDL Chol Calc (NIH): 65 mg/dL (ref 0–99)
Triglycerides: 70 mg/dL (ref 0–149)
VLDL Cholesterol Cal: 14 mg/dL (ref 5–40)

## 2018-11-18 ENCOUNTER — Other Ambulatory Visit: Payer: Self-pay

## 2018-11-18 MED ORDER — FEXOFENADINE HCL 180 MG PO TABS: 180.0000 mg | ORAL_TABLET | Freq: Every day | ORAL | 1 refills | Status: DC

## 2018-11-20 ENCOUNTER — Ambulatory Visit (INDEPENDENT_AMBULATORY_CARE_PROVIDER_SITE_OTHER): Payer: Medicare Other

## 2018-11-20 ENCOUNTER — Ambulatory Visit: Payer: Self-pay

## 2018-11-20 DIAGNOSIS — I129 Hypertensive chronic kidney disease with stage 1 through stage 4 chronic kidney disease, or unspecified chronic kidney disease: Secondary | ICD-10-CM

## 2018-11-20 DIAGNOSIS — E1122 Type 2 diabetes mellitus with diabetic chronic kidney disease: Secondary | ICD-10-CM

## 2018-11-20 DIAGNOSIS — N183 Chronic kidney disease, stage 3 unspecified: Secondary | ICD-10-CM | POA: Diagnosis not present

## 2018-11-20 NOTE — Patient Instructions (Signed)
Social Worker Visit Information  Goals we discussed today:  Goals Addressed            This Visit's Progress     Patient Stated   . "I would like to make my bathroom safer to use" (pt-stated)       Current Barriers:  . Financial constraints related to cost of home modifications . Lacks knowledge of community resource: [employer] . Limited ability to qualify for programs due to home being in the patient and her deceased sons name  Clinical Social Work Clinical Goal(s):  [name] Over the next 60 days the patient will work with SW to become more knowledgeable of resources to assist with home modification needs.  CCM SW Interventions: . Patient interviewed and appropriate assessments performed . Determined the patient is interested in resources to assist with bathroom modifications to improve safety for the patient o The patient indicates her home is in her name and her sons name whom passed away 20 years ago "I never took it out of his name because I couldn't afford it" . Provided information on [employer] [facility]) . Advised the patient SW unsure if she could qualify for the program due to her home also being in her sons name . Obtained verbal consent from the patient to place a referral to [facility] to assess patient eligibility . Referred patient to [facility] via [place] with [place] to inform of new referral . Scheduled follow up call to the patient over the next month to assess outcome of referral  Patient Self Care Activities:  . Self administers medications as prescribed . Calls pharmacy for medication refills . Calls provider office for new concerns or questions  Initial goal documentation       Other   . Collaborate with RN Conservator, museum/gallery to perform appropriate assessments to determined care management and care coordination needs       Current Barriers:  [name] Knowledge barriers related to the independent self-health  management of chronic conditions  Social Work Clinical Goal(s):  [name] Over the next 30 days the patients will work with RN Case Manager to establish an individualized plan of care related to the management ofpatients identified chronic conditions.  Interventions: . Patient interviewed and appropriate assessments performed . Assessed patient understanding of current chronic medical conditions. The patient identifies conditions as kidney disease and diabetes . Assessed for patient ability to afford prescribed medications. The patient identifies some difficulty with recent prescription of Allegra but reports ability to afford certrizine. No other prescription cost concerns identified at this time o Patient educated on role of embedded PharmD should costs become a concern . Collaboration with RN Case Manager regarding patient enrollment into CM program o Patient identifies interest in obtaining an electric bed, if determined to be beneficial to the patient o Communication with RN Case Manager regarding patient stated interest in an electric bed   Patient Self Care Activities:  . Self administers medications as prescribed . Performs ADL's independently . Calls provider office for new concerns or questions  Initial goal documentation         Materials provided: Verbal education about Care Management program provided by phone  Ms. Radman was given information about Chronic Care Management services today including:  1. CCM service includes personalized support from designated clinical staff supervised by her physician, including individualized plan of care and coordination with other care providers 2. 24/7 contact phone numbers for assistance for urgent and  routine care needs. 3. Service will only be billed when office clinical staff spend 20 minutes or more in a month to coordinate care. 4. Only one practitioner may furnish and bill the service in a calendar month. 5. The patient may stop CCM  services at any time (effective at the end of the month) by phone call to the office staff. 6. The patient will be responsible for cost sharing (co-pay) of up to 20% of the service fee (after annual deductible is met).  Patient agreed to services and verbal consent obtained.   The patient verbalized understanding of instructions provided today and declined a print copy of patient instruction materials.   Follow up plan: SW will follow up with patient by phone over the next month.   [name], BSW, CDP Social Worker, Certified Dementia Practitioner [place] [facility] [phone]

## 2018-11-20 NOTE — Chronic Care Management (AMB) (Signed)
Chronic Care Management    Social Work General Note  11/20/2018 Name: Mary [name] MRN: [id number] DOB: 16-May-1945  Mary [name] is a 73 y.o. year old female who is a primary care [name] of Mary Chard, MD. Mary CCM was consulted to assist Mary [name] with care coordination.   Mary [name] was given information about Chronic Care Management services today including:  1. CCM service includes personalized support from designated clinical staff supervised by her physician, including individualized plan of care and coordination with other care providers 2. 24/7 contact phone numbers for assistance for urgent and routine care needs. 3. Service will only be billed when office clinical staff spend 20 minutes or more in a month to coordinate care. 4. Only one practitioner may furnish and bill Mary service in a calendar month. 5. Mary [name] may stop CCM services at any time (effective at Mary end of Mary month) by phone call to Mary office staff. 6. Mary [name] will be responsible for cost sharing (co-pay) of up to 20% of Mary service fee (after annual deductible is met).  [name] agreed to services and verbal consent obtained.   Review of [name] status, including review of consultants reports, relevant laboratory and other test results, and collaboration with appropriate care team members and Mary [name]'s provider was performed as part of comprehensive [name] evaluation and provision of chronic care management services.    SDOH (Social Determinants of Health) screening performed today. See Care Plan Entry related to challenges with: Housing   Outpatient Encounter Medications as of 11/20/2018  Medication Sig Note  . ACCU-CHEK AVIVA PLUS test strip See admin instructions. for testing   . aspirin EC 81 MG tablet Take 81 mg by mouth daily.   . Blood Glucose Monitoring Suppl (ACCU-CHEK AVIVA PLUS) w/Device KIT Use as directed to check blood sugars 2 times per day dx: e11.65   . cetirizine (ZYRTEC)  10 MG tablet TAKE 1 TABLET BY MOUTH EVERY DAY   . fexofenadine (ALLEGRA) 180 MG tablet Take 1 tablet (180 mg total) by mouth daily.   . fluticasone (FLONASE) 50 MCG/ACT nasal spray Place 2 sprays into Mary nose daily. ([name] taking differently: Place 2 sprays into Mary nose daily as needed for allergies. )   . furosemide (LASIX) 40 MG tablet TAKE 1 TABLET BY MOUTH EVERY DAY   . KLOR-CON M20 20 MEQ tablet Take 20 mEq by mouth daily. with food 02/23/2018: [name] instructed to hold this medication due to abnormal Potassium level of 6.2 on 02/18/18, per MD, Dr. [place] [name]. [name] was given verbal education related to complications from hypo and hyperkalemia and importance of following MD recommendations. [name] instructed to resume this medication when directed by PCP. [name] verbalizes understanding.   [name] levothyroxine (SYNTHROID, LEVOTHROID) 112 MCG tablet Take 112 mcg by mouth daily before breakfast.    . Magnesium Oxide -Mg Supplement 400 MG CAPS TAKE 1 CAPSULE BY MOUTH EVERY DAY IN Mary EVENING   . metoprolol succinate (TOPROL-XL) 50 MG 24 hr tablet Take 50 mg by mouth daily.   [name] omeprazole (PRILOSEC) 20 MG capsule TAKE 1 CAPSULE BY MOUTH  DAILY   . oxyCODONE-acetaminophen (PERCOCET) 7.5-325 MG tablet Take 1 tablet by mouth every 4 (four) hours as needed. pain   . OZEMPIC, 0.25 OR 0.5 MG/DOSE, 2 MG/1.5ML SOPN Inject 0.5 mg into Mary skin once a week.   . pravastatin (PRAVACHOL) 40 MG tablet Take 1 tablet (40 mg total) by mouth daily.   [name] telmisartan (MICARDIS) 80  MG tablet Take 1 tablet (80 mg total) by mouth daily.    No facility-administered encounter medications on file as of 11/20/2018.     Goals Addressed            This Visit's Progress     [name] Stated   . "I would like to make my bathroom safer to use" (pt-stated)       Current Barriers:  . Financial constraints related to cost of home modifications . Lacks knowledge of community resource: Mary [name] . Limited  ability to qualify for programs due to home being in Mary [name] and her deceased sons name  Clinical Social Work Clinical Goal(s):  [name] Over Mary next 60 days Mary [name] will work with SW to become more knowledgeable of resources to assist with home modification needs.  CCM SW Interventions: . [name] interviewed and appropriate assessments performed . Determined Mary [name] is interested in resources to assist with bathroom modifications to improve safety for Mary [name] o Mary [name] indicates her home is in her name and her sons name whom passed away 20 years ago "I never took it out of his name because I couldn't afford it" . Provided information on Mary [name] Mary [name]) . Advised Mary [name] SW unsure if she could qualify for Mary program due to her home also being in her sons name . Obtained verbal consent from Mary [name] to place a referral to Mary [name] to assess [name] eligibility . Referred [name] to Mary [name] via Mary [name] with Mary [name] to inform of new referral . Scheduled follow up call to Mary [name] over Mary next month to assess outcome of referral  [name] Self Care Activities:  . Self administers medications as prescribed . Calls pharmacy for medication refills . Calls provider office for new concerns or questions  Initial goal documentation       Other   . Collaborate with Mary [name] to perform appropriate assessments to determined care management and care coordination needs       Current Barriers:  [name] Knowledge barriers related to Mary independent self-health management of chronic conditions  Social Work Clinical Goal(s):  [name] Over Mary next 30 days Mary patients will work with Mary Case Manager to establish an individualized plan of care related to Mary management ofpatients identified chronic conditions.  Interventions: . [name] interviewed and appropriate assessments performed . Assessed [name] understanding of current  chronic medical conditions. Mary [name] identifies conditions as kidney disease and diabetes . Assessed for [name] ability to afford prescribed medications. Mary [name] identifies some difficulty with recent prescription of Allegra but reports ability to afford certrizine. No other prescription cost concerns identified at this time o [name] educated on role of embedded PharmD should costs become a concern . Collaboration with Mary Case Manager regarding [name] enrollment into CM program o [name] identifies interest in obtaining an electric bed, if determined to be beneficial to Mary [name] o Communication with Mary Case Manager regarding [name] stated interest in an electric bed   [name] Self Care Activities:  . Self administers medications as prescribed . Performs ADL's independently . Calls provider office for new concerns or questions  Initial goal documentation         Follow Up Plan: SW will follow up with [name] by phone over Mary next month.       [name], BSW, CDP Social Worker, Certified Dementia Practitioner [place] [facility] [place] [facility] [phone]  Total time spent performing care  coordination and/or care management activities with Mary [name] by phone or face to face = 18 minutes.

## 2018-11-20 NOTE — Chronic Care Management (AMB) (Signed)
Chronic Care Management   Initial Visit Note  11/20/2018 Name: Mary [name] MRN: [id number] DOB: 07-24-1945  Referred by: Glendale Chard, MD Reason for referral : Chronic Care Management (CCM RNCM Case Collaboration )   Mary [name] is a 73 y.o. year old female who is a primary care patient of Glendale Chard, MD. The care management team was consulted for assistance with chronic disease management and care coordination needs related to DMII and CKD Stage III.  Review of patient status, including review of consultants reports, relevant laboratory and other test results, and collaboration with appropriate care team members and the patient's provider was performed as part of comprehensive patient evaluation and provision of chronic care management services.    I initiated and established the plan of care for Mary [name] during one on one collaboration with my clinical care management colleague [name] BSW who is also engaged with this patient to address social work needs.   Outpatient Encounter Medications as of 11/20/2018  Medication Sig Note  . ACCU-CHEK AVIVA PLUS test strip See admin instructions. for testing   . aspirin EC 81 MG tablet Take 81 mg by mouth daily.   . Blood Glucose Monitoring Suppl (ACCU-CHEK AVIVA PLUS) w/Device KIT Use as directed to check blood sugars 2 times per day dx: e11.65   . cetirizine (ZYRTEC) 10 MG tablet TAKE 1 TABLET BY MOUTH EVERY DAY   . fexofenadine (ALLEGRA) 180 MG tablet Take 1 tablet (180 mg total) by mouth daily.   . fluticasone (FLONASE) 50 MCG/ACT nasal spray Place 2 sprays into the nose daily. (Patient taking differently: Place 2 sprays into the nose daily as needed for allergies. )   . furosemide (LASIX) 40 MG tablet TAKE 1 TABLET BY MOUTH EVERY DAY   . KLOR-CON M20 20 MEQ tablet Take 20 mEq by mouth daily. with food 02/23/2018: Patient instructed to hold this medication due to abnormal Potassium level of 6.2 on 02/18/18, per MD, Dr. Glendale Chard. Patient was given verbal education related to complications from hypo and hyperkalemia and importance of following MD recommendations. Patient instructed to resume this medication when directed by PCP. Patient verbalizes understanding.   [name] levothyroxine (SYNTHROID, LEVOTHROID) 112 MCG tablet Take 112 mcg by mouth daily before breakfast.    . Magnesium Oxide -Mg Supplement 400 MG CAPS TAKE 1 CAPSULE BY MOUTH EVERY DAY IN THE EVENING   . metoprolol succinate (TOPROL-XL) 50 MG 24 hr tablet Take 50 mg by mouth daily.   [name] omeprazole (PRILOSEC) 20 MG capsule TAKE 1 CAPSULE BY MOUTH  DAILY   . oxyCODONE-acetaminophen (PERCOCET) 7.5-325 MG tablet Take 1 tablet by mouth every 4 (four) hours as needed. pain   . OZEMPIC, 0.25 OR 0.5 MG/DOSE, 2 MG/1.5ML SOPN Inject 0.5 mg into the skin once a week.   . pravastatin (PRAVACHOL) 40 MG tablet Take 1 tablet (40 mg total) by mouth daily.   [name] telmisartan (MICARDIS) 80 MG tablet Take 1 tablet (80 mg total) by mouth daily.    No facility-administered encounter medications on file as of 11/20/2018.      Goals Addressed    . Assist with Chronic Care Management and Care Coordination needs       Current Barriers:  [name] Knowledge Barriers related to resources and support available to address needs related to Chronic Care Management and Care Coordination needs  Case Manager Clinical Goal(s):  [name] Over the next 30 days, patient will work with the CCM team to address needs related  to Chronic disease management and Care Coordination needs  Interventions:  . Collaborated with BSW and initiated plan of care to address needs related to Chronic Care Management for DM and CKD III, Care Coordination for DME needs  Patient Self Care Activities:  . Attends all scheduled provider appointments . Calls pharmacy for medication refills . Calls provider office for new concerns or questions  Initial goal documentation         Telephone follow up appointment with care  management team member scheduled for: 12/18/18  Barb Merino, RN, BSN, CCM Care Management Coordinator [facility]  Direct Phone: [phone]

## 2018-12-01 ENCOUNTER — Other Ambulatory Visit: Payer: Self-pay

## 2018-12-01 ENCOUNTER — Ambulatory Visit (INDEPENDENT_AMBULATORY_CARE_PROVIDER_SITE_OTHER): Payer: Medicare Other | Admitting: Sports Medicine

## 2018-12-01 ENCOUNTER — Encounter: Payer: Self-pay | Admitting: Sports Medicine

## 2018-12-01 DIAGNOSIS — I739 Peripheral vascular disease, unspecified: Secondary | ICD-10-CM

## 2018-12-01 DIAGNOSIS — L84 Corns and callosities: Secondary | ICD-10-CM

## 2018-12-01 DIAGNOSIS — M79674 Pain in right toe(s): Secondary | ICD-10-CM

## 2018-12-01 DIAGNOSIS — M79675 Pain in left toe(s): Secondary | ICD-10-CM | POA: Diagnosis not present

## 2018-12-01 DIAGNOSIS — B351 Tinea unguium: Secondary | ICD-10-CM | POA: Diagnosis not present

## 2018-12-01 DIAGNOSIS — E1142 Type 2 diabetes mellitus with diabetic polyneuropathy: Secondary | ICD-10-CM

## 2018-12-01 DIAGNOSIS — E1122 Type 2 diabetes mellitus with diabetic chronic kidney disease: Secondary | ICD-10-CM

## 2018-12-01 DIAGNOSIS — N183 Chronic kidney disease, stage 3 unspecified: Secondary | ICD-10-CM

## 2018-12-01 NOTE — Progress Notes (Signed)
Subjective: Mary [name] is a 73 y.o. female patient with history of diabetes who presents to office today complaining of long,mildly painful nails and callus while ambulating in shoes; unable to trim. Patient denies drainage from the right, states that the glucose reading this morning was 91 and last A1c 5.1 like before, Patient denies any new changes in medication or new problems. No other issues noted.  Patient Active Problem List   Diagnosis Date Noted  . Diabetes mellitus with stage 3 chronic kidney disease ([place]) 02/21/2018  . 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 Primary osteoarthritis of both knees 02/21/2018  . Class 3 severe obesity due to excess calories with serious comorbidity and body mass index (BMI) of 45.0 to 49.9 in adult ([place]) 02/21/2018  . Skin ulcer of right foot with fat layer exposed ([place]) 02/18/2018  . Morbid (severe) obesity due to excess calories ([place]) 11/18/2017  . Knee pain 07/16/2015  . Lateral dislocation of right patella 05/04/2015  . Rupture of right quadriceps tendon 05/24/2014  . S/P TKR (total knee replacement) 05/05/2012   Current Outpatient Medications on File Prior to Visit  Medication Sig Dispense Refill  . ACCU-CHEK AVIVA PLUS test strip See admin instructions. for testing  11  . aspirin EC 81 MG tablet Take 81 mg by mouth daily.    . Blood Glucose Monitoring Suppl (ACCU-CHEK AVIVA PLUS) w/Device KIT Use as directed to check blood sugars 2 times per day dx: e11.65 1 kit 1  . cetirizine (ZYRTEC) 10 MG tablet TAKE 1 TABLET BY MOUTH EVERY DAY 90 tablet 1  . fexofenadine (ALLEGRA) 180 MG tablet Take 1 tablet (180 mg total) by mouth daily. 90 tablet 1  . fluticasone (FLONASE) 50 MCG/ACT nasal spray Place 2 sprays into the nose daily. (Patient taking differently: Place 2 sprays into the nose daily as needed for allergies. ) 16 g 0  . furosemide (LASIX) 40 MG tablet TAKE 1 TABLET BY MOUTH EVERY DAY 90 tablet 1  . KLOR-CON M20 20 MEQ tablet Take  20 mEq by mouth daily. with food  1  . levothyroxine (SYNTHROID, LEVOTHROID) 112 MCG tablet Take 112 mcg by mouth daily before breakfast.     . Magnesium Oxide -Mg Supplement 400 MG CAPS TAKE 1 CAPSULE BY MOUTH EVERY DAY IN THE EVENING 90 capsule 1  . metoprolol succinate (TOPROL-XL) 50 MG 24 hr tablet Take 50 mg by mouth daily.  6  . omeprazole (PRILOSEC) 20 MG capsule TAKE 1 CAPSULE BY MOUTH  DAILY 90 capsule 3  . oxyCODONE-acetaminophen (PERCOCET) 7.5-325 MG tablet Take 1 tablet by mouth every 4 (four) hours as needed. pain 30 tablet 0  . OZEMPIC, 0.25 OR 0.5 MG/DOSE, 2 MG/1.5ML SOPN Inject 0.5 mg into the skin once a week. 3 pen 2  . pravastatin (PRAVACHOL) 40 MG tablet Take 1 tablet (40 mg total) by mouth daily. 90 tablet 1  . telmisartan (MICARDIS) 80 MG tablet Take 1 tablet (80 mg total) by mouth daily. 90 tablet 0   No current facility-administered medications on file prior to visit.    No Known Allergies  Recent Results (from the past 2160 hour(s))  Lipid panel     Status: None   Collection Time: 11/16/18  9:45 AM  Result Value Ref Range   Cholesterol, Total 152 100 - 199 mg/dL   Triglycerides 70 0 - 149 mg/dL   HDL 73 >39 mg/dL   VLDL Cholesterol Cal 14 5 - 40 mg/dL   LDL  Chol Calc (NIH) 65 0 - 99 mg/dL   Chol/HDL Ratio 2.1 0.0 - 4.4 ratio    Comment:                                   T. Chol/HDL Ratio                                             Men  Women                               1/2 Avg.Risk  3.4    3.3                                   Avg.Risk  5.0    4.4                                2X Avg.Risk  9.6    7.1                                3X Avg.Risk 23.4   11.0   CMP14+EGFR     Status: Abnormal   Collection Time: 11/16/18  9:45 AM  Result Value Ref Range   Glucose 87 65 - 99 mg/dL   BUN 36 (H) 8 - 27 mg/dL   Creatinine, Ser 1.55 (H) 0.57 - 1.00 mg/dL   GFR calc non Af Amer 34 (L) >59 mL/min/1.73   GFR calc Af Amer 39 (L) >59 mL/min/1.73   BUN/Creatinine  Ratio 23 12 - 28   Sodium 141 134 - 144 mmol/L   Potassium 5.0 3.5 - 5.2 mmol/L   Chloride 105 96 - 106 mmol/L   CO2 25 20 - 29 mmol/L   Calcium 9.7 8.7 - 10.3 mg/dL   Total Protein 7.8 6.0 - 8.5 g/dL   Albumin 4.5 3.8 - 4.8 g/dL   Globulin, Total 3.3 1.5 - 4.5 g/dL   Albumin/Globulin Ratio 1.4 1.2 - 2.2   Bilirubin Total 0.6 0.0 - 1.2 mg/dL   Alkaline Phosphatase 120 (H) 39 - 117 IU/L   AST 12 0 - 40 IU/L   ALT 9 0 - 32 IU/L    Objective: General: Patient is awake, alert, and oriented x 3 and in no acute distress.  Integument: Skin is warm, dry and supple bilateral. Nails are tender, long, thickened and  dystrophic with subungual debris, consistent with onychomycosis, 1-5 bilateral. No signs of infection. + sub met 1 & 5 preulcerative lesions present bilateral. Remaining integument unremarkable.  Vasculature:  Dorsalis Pedis pulse 1/4 bilateral. Posterior Tibial pulse  0/4 bilateral.  Capillary fill time <3 sec 1-5 bilateral. Scant hair growth to the level of the digits. Temperature gradient within normal limits. No varicosities present bilateral. Chronic edema present bilateral.   Neurology: The patient has absent sensation measured with a 5.07/10g Semmes Weinstein Monofilament at all pedal sites bilateral . Vibratory sensation absent bilateral with tuning fork. No Babinski sign present bilateral.   Musculoskeletal: Asymptomatic bunion, pes planus, and hammertoe pedal deformities noted bilateral. Muscular strength  4/5 in all lower extremity muscular groups bilateral without pain on range of motion . No tenderness with calf compression bilateral.  Assessment and Plan: Problem List Items Addressed This Visit      Endocrine   Diabetes mellitus with stage 3 chronic kidney disease ([place])    Other Visit Diagnoses    Pain due to onychomycosis of toenails of both feet    -  Primary   Pre-ulcerative calluses       Diabetic polyneuropathy associated with type 2 diabetes mellitus ([place])        PVD (peripheral vascular disease) ([place])          -Examined patient. -Re-discussed and educated patient on diabetic foot care, especially with regards to the vascular, neurological and musculoskeletal systems.  -Mechanically debrided  Callus x 4 using sterile chisel blade and all nails 1-5 bilateral using sterile nail nipper and filed with dremel without incident  -Previous right foot ulcer remains healed advised to monitor if bleeding recurs to apply betadine and return to office for recheck -May continue to use compression garment daily  -Answered all patient questions -Patient to return  in 9-10 weeks for at risk foot care -Patient advised to call the office if any problems or questions arise in the meantime.  Landis Martins, DPM

## 2018-12-03 ENCOUNTER — Other Ambulatory Visit: Payer: Self-pay | Admitting: Internal Medicine

## 2018-12-16 ENCOUNTER — Telehealth: Payer: Self-pay

## 2018-12-16 ENCOUNTER — Ambulatory Visit: Payer: Self-pay

## 2018-12-16 DIAGNOSIS — E1122 Type 2 diabetes mellitus with diabetic chronic kidney disease: Secondary | ICD-10-CM

## 2018-12-16 DIAGNOSIS — N183 Chronic kidney disease, stage 3 unspecified: Secondary | ICD-10-CM

## 2018-12-17 NOTE — Chronic Care Management (AMB) (Signed)
  Chronic Care Management   Outreach Note  12/16/2018 Name: Mary [name] MRN: [id number] DOB: [birth year]  Referred by: Glendale Chard, MD Reason for referral : Care Coordination   SW placed an unsuccessful outbound call to the patient to assess progression of patient goal. SW left a HIPAA compliant voice message requesting a return call.  Follow Up Plan: The care management team will reach out to the patient again over the next 14 days.   [name], BSW, CDP Social Worker, Certified Dementia Practitioner [place] [facility] [phone]

## 2018-12-17 NOTE — Addendum Note (Signed)
Addended by: Gardiner Sleeper on: 12/17/2018 08:18 AM   Modules accepted: Level of Service

## 2018-12-18 ENCOUNTER — Telehealth: Payer: Self-pay

## 2018-12-21 ENCOUNTER — Encounter (INDEPENDENT_AMBULATORY_CARE_PROVIDER_SITE_OTHER): Payer: Medicare Other | Admitting: Ophthalmology

## 2018-12-21 NOTE — Progress Notes (Addendum)
[facility] Note  12/24/2018     CHIEF COMPLAINT Patient presents for Retina Follow Up   HISTORY OF PRESENT ILLNESS: Mary [name] is a 73 y.o. female who presents to the clinic today for:   HPI    Retina Follow Up    Patient presents with  CRVO/BRVO.  In both eyes.  This started 6 weeks ago.  Severity is severe.  Duration of 6 weeks.  Since onset it is stable.  I, the attending physician,  performed the HPI with the patient and updated documentation appropriately.          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  No change in [place] OU.  Denies pain, flashes, floaters.  Combigan bid OD and Rocklatan QHS OD.  BS 106 yesterday.  A1C 5.1 1 mo ago.       Last edited by Bernarda Caffey, MD on 12/24/2018 10:12 AM. (History)    pt states she had no problems after the injection at last visit, pt saw Dr. Kathlen Mody on Tuesday, pt is using FML TID and Rocklatan QHS, pt states she heard from [facility] that he is no longer eligible for that program bc she is not on that medication   Referring physician: Glendale Chard, [address] [place],  [place] [address]  HISTORICAL INFORMATION:   Selected notes from the [facility]: Current Outpatient Medications (Ophthalmic Drugs)  Medication Sig  . COMBIGAN 0.2-0.5 % ophthalmic solution Place 1 drop into the right eye 3 (three) times daily.  [name] ROCKLATAN 0.02-0.005 % SOLN Place 1 drop into the right eye at bedtime.  . fluorometholone (FML) 0.1 % ophthalmic suspension Place 1 drop into the right eye 3 (three) times daily.   No current facility-administered medications for this visit. (Ophthalmic Drugs)   Current Outpatient Medications (Other)  Medication Sig  . ACCU-CHEK AVIVA PLUS test strip See admin instructions. for testing  . aspirin EC 81 MG tablet Take 81 mg by mouth daily.  . Blood Glucose Monitoring Suppl (ACCU-CHEK  AVIVA PLUS) w/Device KIT Use as directed to check blood sugars 2 times per day dx: e11.65  . cetirizine (ZYRTEC) 10 MG tablet TAKE 1 TABLET BY MOUTH EVERY DAY  . fexofenadine (ALLEGRA) 180 MG tablet Take 1 tablet (180 mg total) by mouth daily.  . fluticasone (FLONASE) 50 MCG/ACT nasal spray Place 2 sprays into the nose daily. (Patient taking differently: Place 2 sprays into the nose daily as needed for allergies. )  . furosemide (LASIX) 40 MG tablet TAKE 1 TABLET BY MOUTH EVERY DAY  . HYDROcodone-acetaminophen (NORCO) 10-325 MG tablet Take 1 tablet by mouth 5 (five) times daily as needed.  [name] KLOR-CON M20 20 MEQ tablet Take 20 mEq by mouth daily. with food  . levothyroxine (SYNTHROID, LEVOTHROID) 112 MCG tablet Take 112 mcg by mouth daily before breakfast.   . Magnesium Oxide -Mg Supplement 400 MG CAPS TAKE 1 CAPSULE BY MOUTH EVERY DAY IN THE EVENING  . metoprolol succinate (TOPROL-XL) 50 MG 24 hr tablet Take 50 mg by mouth daily.  . metoprolol tartrate (LOPRESSOR) 50 MG tablet Take 50 mg by mouth daily.  [name] omeprazole (PRILOSEC) 20 MG capsule Take 1 capsule (20 mg total) by mouth daily.  [name] OZEMPIC, 0.25 OR 0.5 MG/DOSE, 2 MG/1.5ML SOPN Inject 0.5 mg into the skin once a  week.  . pravastatin (PRAVACHOL) 40 MG tablet Take 1 tablet (40 mg total) by mouth daily.  [name] telmisartan (MICARDIS) 80 MG tablet TAKE 1 TABLET BY MOUTH  DAILY  . Cetirizine HCl 10 MG CAPS Take 1 capsule (10 mg total) by mouth daily. (Patient not taking: Reported on 12/24/2018)  . omeprazole (PRILOSEC) 20 MG capsule TAKE 1 CAPSULE BY MOUTH  DAILY (Patient not taking: Reported on 12/24/2018)   No current facility-administered medications for this visit. (Other)      REVIEW OF SYSTEMS: ROS    Positive for: Musculoskeletal, Endocrine, Eyes   Negative for: Constitutional, Gastrointestinal, Neurological, Skin, Genitourinary, HENT, Cardiovascular, Respiratory, Psychiatric, Allergic/Imm, Heme/Lymph   Last edited by Matthew Folks,  COA on 12/24/2018  9:03 AM. (History)       ALLERGIES No Known Allergies  PAST MEDICAL HISTORY Past Medical History:  Diagnosis Date  . Arthritis    osteoarthritis  . Cataract    OS  . Diabetes mellitus    Insulin dependent  . Diabetic retinopathy ([place])    NPDR OU  . Glaucoma    POAG OD  . H/O cardiovascular stress test    pt. reports 10/13- was normal per pt.   . Hyperlipemia   . Hypertension   . Hypertensive retinopathy    OU   Past Surgical History:  Procedure Laterality Date  . CARPAL TUNNEL RELEASE Bilateral   . CATARACT EXTRACTION Right 10/05/2018   Dr. Kathlen Mody  . EYE SURGERY Right    Cat Sx  . KNEE ARTHROSCOPY     right  . QUADRICEPS TENDON REPAIR  02/17/2012   Procedure: REPAIR QUADRICEP TENDON;  Surgeon: Vickey Huger, MD;  Location: [facility];  Service: Orthopedics;  Laterality: Right;  right quadriceps repair with latera release  . QUADRICEPS TENDON REPAIR Right 05/15/2012   Procedure: REPAIR QUADRICEP TENDON;  Surgeon: Vickey Huger, MD;  Location: [facility];  Service: Orthopedics;  Laterality: Right;  . TOTAL KNEE ARTHROPLASTY  10/21/2011   rt tk  . TOTAL KNEE ARTHROPLASTY  10/21/2011   Procedure: TOTAL KNEE ARTHROPLASTY;  Surgeon: Rudean Haskell, MD;  Location: [facility];  Service: Orthopedics;  Laterality: Right;  . TUBAL LIGATION      FAMILY HISTORY Family History  Problem Relation Age of Onset  . Hypertension Mother   . Heart Problems Father   . Gout Father   . Arthritis Father     SOCIAL HISTORY Social History   Tobacco Use  . Smoking status: Former Smoker    Packs/day: 0.25    Years: 20.00    Pack years: 5.00    Types: Cigarettes    Quit date: 06/28/2014    Years since quitting: 4.4  . Smokeless tobacco: Never Used  Substance Use Topics  . Alcohol use: No  . Drug use: Yes    Types: Oxycodone    Comment: Uses as needed for pain         OPHTHALMIC EXAM:  Base Eye Exam    Visual Acuity (Snellen - Linear)      Right Left   Dist cc 20/80 -2  HM   Dist ph cc NI CF   Correction: Glasses       Tonometry (Tonopen, 9:10 AM)      Right Left   Pressure 13 15       Pupils      Dark Light Shape React APD   Right 3 2 Round Brisk None   Left 3 2 Round Brisk  None       Visual Fields (Counting fingers)      Left Right     Full   Restrictions Total superior temporal, inferior temporal, superior nasal, inferior nasal deficiencies        Extraocular Movement      Right Left    Full, Ortho Full, Ortho       Neuro/Psych    Oriented x3: Yes   Mood/Affect: Normal       Dilation    Both eyes: 1.0% Mydriacyl, 2.5% Phenylephrine @ 9:10 AM        Slit Lamp and Fundus Exam    Slit Lamp Exam      Right Left   Lids/Lashes Dermatochalasis - upper lid, mild Meibomian gland dysfunction Dermatochalasis - upper lid, mild Meibomian gland dysfunction   Conjunctiva/Sclera Melanosis Melanosis   Cornea 2-3+ Punctate epithelial erosions, well healed temporal cataract wounds 3+ Punctate epithelial erosions, well healed temporal cataract wounds   Anterior Chamber Deep and quiet Deep and quiet   Iris Round and moderately dilated to 5.61m Round and moderately dilated to 635m  Lens PC IOL in good position, trace, cortical remnant temporally, PC folds 2-3+ Nuclear sclerosis, 2-3+ Cortical cataract   Vitreous Vitreous syneresis, vitreous condensations, Ozurdex remnants inferiorly Vitreous syneresis       Fundus Exam      Right Left   Disc Trace Pallor, Sharp rim Mild Pallor, Sharp rim, PPP   C/D Ratio 0.4 0.3   Macula Blunted foveal reflex, [place], Retinal pigment epithelial mottling, diffuse edema, CWS inferiorly Flat, Blunted foveal reflex, RPE mottling and clumping, scarring   Vessels Vascular attenuation, Tortuous Severe Vascular attenuation, +fibrosis   Periphery Attached    Attached, pavingstone and reticular degeneration inferiorly          IMAGING AND PROCEDURES  Imaging and Procedures for _0 @  OCT, Retina - OU - Both Eyes        Right Eye Quality was good. Central Foveal Thickness: 414. Progression has improved. Findings include abnormal foveal contour, intraretinal fluid, intraretinal hyper-reflective material, no SRF, vitreomacular adhesion  (Interval improvement in IRF).   Left Eye Quality was good. Central Foveal Thickness: 222. Progression has been stable. Findings include abnormal foveal contour, no IRF, no SRF, outer retinal atrophy, epiretinal membrane, preretinal fibrosis, subretinal hyper-reflective material, pigment epithelial detachment.   Notes *Images captured and stored on drive  Diagnosis / Impression:  OD: diffuse macular edema - interval improvement in IRF OS: diffuse ORA  Clinical management:  See below  Abbreviations: NFP - Normal foveal profile. CME - cystoid macular edema. PED - pigment epithelial detachment. IRF - intraretinal fluid. SRF - subretinal fluid. EZ - ellipsoid zone. ERM - epiretinal membrane. ORA - outer retinal atrophy. ORT - outer retinal tubulation. SRHM - subretinal hyper-reflective material        Intravitreal Injection, Pharmacologic Agent - OD - Right Eye       Time Out 12/24/2018. 9:40 AM. Confirmed correct patient, procedure, site, and patient consented.   Anesthesia Topical anesthesia was used. Anesthetic medications included Lidocaine 2%, Proparacaine 0.5%.   Procedure Preparation included 5% betadine to ocular surface, eyelid speculum. A 27 gauge needle was used.   Injection:  4 mg Triescence 4031mlL injection   NDC: 006734-754-2696ot: 105VA, Expiration date: 05/13/2020   Route: Intravitreal, Site: Right Eye, Waste: 0.9 mL  Post-op Post injection exam found visual acuity of at least counting fingers. The patient tolerated the procedure well. There were no  complications. The patient received written and verbal post procedure care education.   Notes An AC tap was performed following injection due to elevated IOP using a 30 gauge needle on a  syringe with the plunger removed. The needle was placed at the limbus at 5 oclock and approximately 0.07 cc of aqueous was removed from the anterior chamber. Betadine was applied to the tap area before and after the paracentesis was performed. There were no complications. The patient tolerated the procedure well. The IOP was rechecked and was found to be ~10 mmHg by digital palpation.                 ASSESSMENT/PLAN:    ICD-10-CM   1. Central retinal vein occlusion with macular edema of both eyes  H34.8130 Intravitreal Injection, Pharmacologic Agent - OD - Right Eye    triamcinolone acetonide (TRIESENCE) 40 MG/ML subtenons injection 4 mg  2. Moderate nonproliferative diabetic retinopathy of both eyes with macular edema associated with type 2 diabetes mellitus (HCC)  J28.7867 Intravitreal Injection, Pharmacologic Agent - OD - Right Eye    triamcinolone acetonide (TRIESENCE) 40 MG/ML subtenons injection 4 mg  3. Retinal edema  H35.81 OCT, Retina - OU - Both Eyes  4. Essential hypertension  I10   5. Hypertensive retinopathy of both eyes  H35.033   6. Combined forms of age-related cataract of left eye  H25.812   7. Pseudophakia  Z96.1   8. Primary open angle glaucoma (POAG) of right eye, mild stage  H40.1111     1. CRVO w/ CME OU  - pt of Dr. Zigmund Daniel who wished to transfer care  - OS long-standing low vision (HM) with subfoveal scar and central retinal atrophy  - OD with CME actively being managed with Ozurdex OD ~q5 wks by Dr. Zigmund Daniel (last on 8.20.20) s/p multiple IVTA and Ozurdex OD  - was due for Ozurdex OD w/ Zigmund Daniel on 9.25.20, but underwent cataract surgery OD w/ Kathlen Mody on 9.21.20  - s/p IVTA OD #4 (10.26.20)  - today BCVA OD 20/80 (stable)  - exam shows no residual IVTA in vitreous cavity  - OCT shows diffuse macular edema slightly improved from prior -- likely multifactorial -- CRVO, DME, post-cataract CME  - recommend IVTA OD #5, 12.10.20  - pt wishes to proceed  - RBA  of procedure discussed, questions answered  - informed consent obtained and signed  - see procedure note  - f/u in 6 wks -- DFE/OCT/possible injection  2,3. Severe Non-proliferative diabetic retinopathy OU  - The incidence, risk factors for progression, natural history and treatment options for diabetic retinopathy  were discussed with patient.    - The need for close monitoring of blood glucose, blood pressure, and serum lipids, avoiding cigarette or any type of tobacco, and the need for long term follow up was also discussed with patient.  - OCT shows diabetic macular edema, OD   - recommend IVTA OD today, 12.10.20 as above for CRVO  4,5. Hypertensive retinopathy OU  - discussed importance of tight BP control  - monitor  6. Mixed form age related cataract OS  - under the expert management of Dr. Kathlen Mody  7. Pseudophakia OD  - s/p CE/IOL OD (9.21.20) by expert surgeon, Dr. Kathlen Mody  - IOL in excellent position  - had post op IOP spike -- IOP now under better control  - monitor  8. POAG OD  - under the expert management of Dr. Kathlen Mody  - s/p SLT and goniotomy OD  -  had post-cataract IOP spike  - IOP 13 today and did post-injection AC tap OD after injecting Triesence   Ophthalmic Meds Ordered this visit:  Meds ordered this encounter  Medications  . triamcinolone acetonide (TRIESENCE) 40 MG/ML subtenons injection 4 mg       Return in about 6 weeks (around 02/04/2019) for f/u CRVO OU, DFE, OCT.  There are no Patient Instructions on file for this visit.   This document serves as a record of services personally performed by Gardiner Sleeper, MD, PhD. It was created on their behalf by Leeann Must, [place], a certified ophthalmic assistant. The creation of this record is the provider's dictation and/or activities during the visit.    Electronically signed by: Leeann Must, COA _0 @ 8:06 PM   This document serves as a record of services personally performed by Gardiner Sleeper,  MD, PhD. It was created on their behalf by Ernest Mallick, OA, an ophthalmic assistant. The creation of this record is the provider's dictation and/or activities during the visit.    Electronically signed by: Ernest Mallick, OA 12.10.2020 8:06 PM   Explained the diagnoses, plan, and follow up with the patient and they expressed understanding.  Patient expressed understanding of the importance of proper follow up care.   Gardiner Sleeper, M.D., Ph.D. Diseases & Surgery of the Retina and Vitreous [facility]  I have reviewed the above documentation for accuracy and completeness, and I agree with the above. Gardiner Sleeper, M.D., Ph.D. 12/24/18 8:06 PM    Abbreviations: M myopia (nearsighted); A astigmatism; H hyperopia (farsighted); P presbyopia; Mrx spectacle prescription;  CTL contact lenses; OD right eye; OS left eye; OU both eyes  XT exotropia; ET esotropia; PEK punctate epithelial keratitis; PEE punctate epithelial erosions; DES dry eye syndrome; MGD meibomian gland dysfunction; ATs artificial tears; PFAT's preservative free artificial tears; [place] nuclear sclerotic cataract; PSC posterior subcapsular cataract; ERM epi-retinal membrane; PVD posterior vitreous detachment; RD retinal detachment; DM diabetes mellitus; DR diabetic retinopathy; NPDR non-proliferative diabetic retinopathy; PDR proliferative diabetic retinopathy; CSME clinically significant macular edema; DME diabetic macular edema; dbh dot blot hemorrhages; CWS cotton wool spot; POAG primary open angle glaucoma; C/D cup-to-disc ratio; HVF humphrey visual field; GVF goldmann visual field; OCT optical coherence tomography; IOP intraocular pressure; BRVO Branch retinal vein occlusion; CRVO central retinal vein occlusion; CRAO central retinal artery occlusion; BRAO branch retinal artery occlusion; RT retinal tear; SB scleral buckle; PPV pars plana vitrectomy; VH Vitreous hemorrhage; PRP panretinal laser photocoagulation; IVK  intravitreal kenalog; VMT vitreomacular traction; MH Macular hole;  NVD neovascularization of the disc; NVE neovascularization elsewhere; AREDS age related eye disease study; ARMD age related macular degeneration; POAG primary open angle glaucoma; EBMD epithelial/anterior basement membrane dystrophy; ACIOL anterior chamber intraocular lens; IOL intraocular lens; PCIOL posterior chamber intraocular lens; Phaco/IOL phacoemulsification with intraocular lens placement; [place] photorefractive keratectomy; LASIK laser assisted in situ keratomileusis; HTN hypertension; DM diabetes mellitus; COPD chronic obstructive pulmonary disease

## 2018-12-24 ENCOUNTER — Encounter (INDEPENDENT_AMBULATORY_CARE_PROVIDER_SITE_OTHER): Payer: Self-pay | Admitting: Ophthalmology

## 2018-12-24 ENCOUNTER — Other Ambulatory Visit: Payer: Self-pay

## 2018-12-24 ENCOUNTER — Ambulatory Visit (INDEPENDENT_AMBULATORY_CARE_PROVIDER_SITE_OTHER): Payer: Medicare Other | Admitting: Ophthalmology

## 2018-12-24 DIAGNOSIS — H401111 Primary open-angle glaucoma, right eye, mild stage: Secondary | ICD-10-CM

## 2018-12-24 DIAGNOSIS — H3581 Retinal edema: Secondary | ICD-10-CM

## 2018-12-24 DIAGNOSIS — Z961 Presence of intraocular lens: Secondary | ICD-10-CM

## 2018-12-24 DIAGNOSIS — I10 Essential (primary) hypertension: Secondary | ICD-10-CM

## 2018-12-24 DIAGNOSIS — H25812 Combined forms of age-related cataract, left eye: Secondary | ICD-10-CM

## 2018-12-24 DIAGNOSIS — H34813 Central retinal vein occlusion, bilateral, with macular edema: Secondary | ICD-10-CM | POA: Diagnosis not present

## 2018-12-24 DIAGNOSIS — E113313 Type 2 diabetes mellitus with moderate nonproliferative diabetic retinopathy with macular edema, bilateral: Secondary | ICD-10-CM

## 2018-12-24 DIAGNOSIS — H35033 Hypertensive retinopathy, bilateral: Secondary | ICD-10-CM

## 2018-12-24 MED ORDER — TRIAMCINOLONE ACETONIDE 40 MG/ML IO SUSP
4.0000 mg | INTRAOCULAR | Status: AC | PRN
  Administered 2018-12-24: 20:00:00 4 mg via INTRAVITREAL

## 2018-12-25 ENCOUNTER — Telehealth: Payer: Self-pay

## 2018-12-26 ENCOUNTER — Other Ambulatory Visit: Payer: Self-pay | Admitting: Internal Medicine

## 2018-12-28 ENCOUNTER — Ambulatory Visit: Payer: Self-pay

## 2018-12-28 ENCOUNTER — Telehealth: Payer: Self-pay | Admitting: Pharmacist

## 2018-12-28 DIAGNOSIS — E1122 Type 2 diabetes mellitus with diabetic chronic kidney disease: Secondary | ICD-10-CM

## 2018-12-28 NOTE — Chronic Care Management (AMB) (Signed)
Chronic Care Management   Social Work Follow Up Note  12/28/2018 Name: Mary [name] MRN: [id number] DOB: [birth year]  Mary [name] is a 73 y.o. year old female who is a primary care patient of Glendale Chard, MD. The CCM team was consulted for assistance with care coordination.   Review of patient status, including review of consultants reports, other relevant assessments, and collaboration with appropriate care team members and the patient's provider was performed as part of comprehensive patient evaluation and provision of chronic care management services.    SW placed an outbound call to the patient to review progression of patient goal.  Outpatient Encounter Medications as of 12/28/2018  Medication Sig Note  . ACCU-CHEK AVIVA PLUS test strip See admin instructions. for testing   . aspirin EC 81 MG tablet Take 81 mg by mouth daily.   . Blood Glucose Monitoring Suppl (ACCU-CHEK AVIVA PLUS) w/Device KIT Use as directed to check blood sugars 2 times per day dx: e11.65   . cetirizine (ZYRTEC) 10 MG tablet TAKE 1 TABLET BY MOUTH EVERY DAY   . Cetirizine HCl 10 MG CAPS Take 1 capsule (10 mg total) by mouth daily. (Patient not taking: Reported on 12/24/2018)   . COMBIGAN 0.2-0.5 % ophthalmic solution Place 1 drop into the right eye 3 (three) times daily.   . fexofenadine (ALLEGRA) 180 MG tablet Take 1 tablet (180 mg total) by mouth daily.   . fluorometholone (FML) 0.1 % ophthalmic suspension Place 1 drop into the right eye 3 (three) times daily.   . fluticasone (FLONASE) 50 MCG/ACT nasal spray SPRAY 1 SPRAY INTO EACH NOSTRIL EVERY DAY   . furosemide (LASIX) 40 MG tablet TAKE 1 TABLET BY MOUTH EVERY DAY   . HYDROcodone-acetaminophen (NORCO) 10-325 MG tablet Take 1 tablet by mouth 5 (five) times daily as needed.   [name] KLOR-CON M20 20 MEQ tablet Take 20 mEq by mouth daily. with food 02/23/2018: Patient instructed to hold this medication due to abnormal Potassium level of 6.2 on 02/18/18, per MD,  Dr. Glendale Chard. Patient was given verbal education related to complications from hypo and hyperkalemia and importance of following MD recommendations. Patient instructed to resume this medication when directed by PCP. Patient verbalizes understanding.   [name] levothyroxine (SYNTHROID, LEVOTHROID) 112 MCG tablet Take 112 mcg by mouth daily before breakfast.    . Magnesium Oxide -Mg Supplement 400 MG CAPS TAKE 1 CAPSULE BY MOUTH EVERY DAY IN THE EVENING   . metoprolol succinate (TOPROL-XL) 50 MG 24 hr tablet Take 50 mg by mouth daily.   . metoprolol tartrate (LOPRESSOR) 50 MG tablet Take 50 mg by mouth daily.   [name] omeprazole (PRILOSEC) 20 MG capsule Take 1 capsule (20 mg total) by mouth daily.   [name] omeprazole (PRILOSEC) 20 MG capsule TAKE 1 CAPSULE BY MOUTH  DAILY (Patient not taking: Reported on 12/24/2018)   . OZEMPIC, 0.25 OR 0.5 MG/DOSE, 2 MG/1.5ML SOPN Inject 0.5 mg into the skin once a week.   . pravastatin (PRAVACHOL) 40 MG tablet Take 1 tablet (40 mg total) by mouth daily.   [name] ROCKLATAN 0.02-0.005 % SOLN Place 1 drop into the right eye at bedtime.   [name] telmisartan (MICARDIS) 80 MG tablet TAKE 1 TABLET BY MOUTH  DAILY    No facility-administered encounter medications on file as of 12/28/2018.     Goals Addressed            This Visit's Progress     Patient Stated   . "  I would like to make my bathroom safer to use" (pt-stated)       Current Barriers:  . Financial constraints related to cost of home modifications . Lacks knowledge of community resource: [employer] . Limited ability to qualify for programs due to home being in the patient and her deceased sons name . Limited ability to safely perform ADL's due to DM and CKD III  Clinical Social Work Clinical Goal(s):  [name] Over the next 60 days the patient will work with SW to become more knowledgeable of resources to assist with home modification needs.  CCM SW Interventions: . Outbound call placed to the patient to  review progression of patient stated goal . Determined the patient has been in contact with [employer] but must take her deceased sons name off the home deed prior to receiving assistance . Discussed patient plans to have deed corrected o Patient reports plan to contact Legal Aid after the new year to review resources for assistance o Scheduled follow up call to the patient over the next month to assess outcome of legal aid call  Patient Self Care Activities:  . Self administers medications as prescribed . Calls pharmacy for medication refills . Calls provider office for new concerns or questions  Please see past updates related to this goal by clicking on the "Past Updates" button in the selected goal          Follow Up Plan: SW will follow up with patient by phone over the next month   [name], BSW, CDP Social Worker, Certified Dementia Practitioner [place] [facility] [phone]  Total time spent performing care coordination and/or care management activities with the patient by phone or face to face = 11 minutes.

## 2018-12-28 NOTE — Patient Instructions (Signed)
Social Worker Visit Information  Goals we discussed today:  Goals Addressed            This Visit's Progress     Patient Stated   . "I would like to make my bathroom safer to use" (pt-stated)       Current Barriers:  . Financial constraints related to cost of home modifications . Lacks knowledge of community resource: [employer] . Limited ability to qualify for programs due to home being in the patient and her deceased sons name . Limited ability to safely perform ADL's due to DM and CKD III  Clinical Social Work Clinical Goal(s):  [name] Over the next 60 days the patient will work with SW to become more knowledgeable of resources to assist with home modification needs.  CCM SW Interventions: . Outbound call placed to the patient to review progression of patient stated goal . Determined the patient has been in contact with [employer] but must take her deceased sons name off the home deed prior to receiving assistance . Discussed patient plans to have deed corrected o Patient reports plan to contact Legal Aid after the new year to review resources for assistance o Scheduled follow up call to the patient over the next month to assess outcome of legal aid call  Patient Self Care Activities:  . Self administers medications as prescribed . Calls pharmacy for medication refills . Calls provider office for new concerns or questions  Please see past updates related to this goal by clicking on the "Past Updates" button in the selected goal          Follow Up Plan: SW will follow up with patient by phone over the next month  [name], BSW, CDP Social Worker, Certified Dementia Practitioner [facility] [phone]

## 2019-01-13 ENCOUNTER — Telehealth: Payer: Self-pay | Admitting: Pharmacist

## 2019-01-17 ENCOUNTER — Other Ambulatory Visit: Payer: Self-pay | Admitting: Internal Medicine

## 2019-01-25 ENCOUNTER — Telehealth: Payer: Self-pay

## 2019-01-27 ENCOUNTER — Other Ambulatory Visit: Payer: Self-pay

## 2019-01-27 ENCOUNTER — Ambulatory Visit (INDEPENDENT_AMBULATORY_CARE_PROVIDER_SITE_OTHER): Payer: Medicare Other | Admitting: Pharmacist

## 2019-01-27 DIAGNOSIS — N183 Chronic kidney disease, stage 3 unspecified: Secondary | ICD-10-CM

## 2019-01-27 DIAGNOSIS — E1122 Type 2 diabetes mellitus with diabetic chronic kidney disease: Secondary | ICD-10-CM

## 2019-01-27 MED ORDER — MAGNESIUM OXIDE -MG SUPPLEMENT 400 MG PO CAPS: ORAL_CAPSULE | ORAL | 1 refills | Status: DC

## 2019-01-27 MED ORDER — PRAVASTATIN SODIUM 40 MG PO TABS: 40.0000 mg | ORAL_TABLET | Freq: Every day | ORAL | 1 refills | Status: DC

## 2019-01-27 NOTE — Progress Notes (Signed)
[facility] Note  02/04/2019     CHIEF COMPLAINT Patient presents for Retina Follow Up   HISTORY OF PRESENT ILLNESS: Mary [name] is a 74 y.o. female who presents to the clinic today for:   HPI    Retina Follow Up    Patient presents with  CRVO/BRVO.  In both eyes.  This started 3 months ago.  Severity is moderate.  Duration of 6 weeks.  Since onset it is stable.  I, the attending physician,  performed the HPI with the patient and updated documentation appropriately.          Comments    74 y/o female pt here for 6 wk f/u for CRVO w/mac edema OU.  No change in [place] OU.  Denies pain, flashes, floaters.  Pred TID OD.  [place] QHS OD.  BS 110 yesterday.  A1C 5.9 in September 2020.       Last edited by Mary Caffey, MD on 02/04/2019 10:22 AM. (History)    pt states she had no problems with the injection at last visit, she states she saw floaters afterwards, but does not see them anymore  Referring physician: Glendale Chard, MD [address] [place],  [place] [address]  HISTORICAL INFORMATION:   Selected notes from the [place]: Current Outpatient Medications (Ophthalmic Drugs)  Medication Sig  . prednisoLONE acetate (PRED FORTE) 1 % ophthalmic suspension Place 1 drop into the right eye 4 (four) times daily.  [name] ROCKLATAN 0.02-0.005 % SOLN Place 1 drop into the right eye at bedtime.  . COMBIGAN 0.2-0.5 % ophthalmic solution Place 1 drop into the right eye 3 (three) times daily.  . fluorometholone (FML) 0.1 % ophthalmic suspension Place 1 drop into the right eye 3 (three) times daily.   No current facility-administered medications for this visit. (Ophthalmic Drugs)   Current Outpatient Medications (Other)  Medication Sig  . ACCU-CHEK AVIVA PLUS test strip See admin instructions. for testing  . aspirin EC 81 MG tablet Take 81 mg by mouth daily.  Mary [name] Comfort Lancets 28G MISC   . Blood Glucose  Monitoring Suppl (ACCU-CHEK AVIVA PLUS) w/Device KIT Use as directed to check blood sugars 2 times per day dx: e11.65  . cetirizine (ZYRTEC) 10 MG tablet TAKE 1 TABLET BY MOUTH EVERY DAY  . fexofenadine (ALLEGRA) 180 MG tablet Take 1 tablet (180 mg total) by mouth daily.  . fluticasone (FLONASE) 50 MCG/ACT nasal spray SPRAY 1 SPRAY INTO EACH NOSTRIL EVERY DAY  . furosemide (LASIX) 40 MG tablet TAKE 1 TABLET BY MOUTH EVERY DAY  . HYDROcodone-acetaminophen (NORCO) 10-325 MG tablet Take 1 tablet by mouth 5 (five) times daily as needed.  [name] KLOR-CON M20 20 MEQ tablet Take 20 mEq by mouth daily. with food  . levothyroxine (SYNTHROID, LEVOTHROID) 112 MCG tablet Take 112 mcg by mouth daily before breakfast.   . Magnesium Oxide -Mg Supplement 400 MG CAPS TAKE 1 CAPSULE BY MOUTH EVERY DAY IN THE EVENING  . metoprolol succinate (TOPROL-XL) 50 MG 24 hr tablet Take 50 mg by mouth daily.  [name] omeprazole (PRILOSEC) 20 MG capsule Take 1 capsule (20 mg total) by mouth daily.  [name] omeprazole (PRILOSEC) 20 MG capsule TAKE 1 CAPSULE BY MOUTH  DAILY  . OZEMPIC, 0.25 OR 0.5 MG/DOSE, 2 MG/1.5ML SOPN INJECT SUBCUTANEOUSLY 0.5MG ONCE A WEEK.  . pravastatin (PRAVACHOL) 40 MG tablet Take 1 tablet (40 mg total) by mouth daily.  [name]  telmisartan (MICARDIS) 80 MG tablet TAKE 1 TABLET BY MOUTH  DAILY   No current facility-administered medications for this visit. (Other)      REVIEW OF SYSTEMS: ROS    Positive for: Musculoskeletal, Endocrine, Eyes   Negative for: Constitutional, Gastrointestinal, Neurological, Skin, Genitourinary, HENT, Cardiovascular, Respiratory, Psychiatric, Allergic/Imm, Heme/Lymph   Last edited by Mary [name], COA on 02/04/2019  9:26 AM. (History)       ALLERGIES No Known Allergies  PAST MEDICAL HISTORY Past Medical History:  Diagnosis Date  . Arthritis    osteoarthritis  . Cataract    OS  . Diabetes mellitus    Insulin dependent  . Diabetic retinopathy ([place])    NPDR OU  . Glaucoma     POAG OD  . H/O cardiovascular stress test    pt. reports 10/13- was normal per pt.   . Hyperlipemia   . Hypertension   . Hypertensive retinopathy    OU   Past Surgical History:  Procedure Laterality Date  . CARPAL TUNNEL RELEASE Bilateral   . CATARACT EXTRACTION Right 10/05/2018   Dr. Kathlen Mody  . EYE SURGERY Right    Cat Sx  . KNEE ARTHROSCOPY     right  . QUADRICEPS TENDON REPAIR  02/17/2012   Procedure: REPAIR QUADRICEP TENDON;  Surgeon: Vickey Huger, MD;  Location: [facility];  Service: Orthopedics;  Laterality: Right;  right quadriceps repair with latera release  . QUADRICEPS TENDON REPAIR Right 05/15/2012   Procedure: REPAIR QUADRICEP TENDON;  Surgeon: Vickey Huger, MD;  Location: [place] [facility];  Service: Orthopedics;  Laterality: Right;  . TOTAL KNEE ARTHROPLASTY  10/21/2011   rt tk  . TOTAL KNEE ARTHROPLASTY  10/21/2011   Procedure: TOTAL KNEE ARTHROPLASTY;  Surgeon: Rudean Haskell, MD;  Location: [facility];  Service: Orthopedics;  Laterality: Right;  . TUBAL LIGATION      FAMILY HISTORY Family History  Problem Relation Age of Onset  . Hypertension Mother   . Heart Problems Father   . Gout Father   . Arthritis Father     SOCIAL HISTORY Social History   Tobacco Use  . Smoking status: Former Smoker    Packs/day: 0.25    Years: 20.00    Pack years: 5.00    Types: Cigarettes    Quit date: 06/28/2014    Years since quitting: 4.6  . Smokeless tobacco: Never Used  Substance Use Topics  . Alcohol use: No  . Drug use: Yes    Types: Oxycodone    Comment: Uses as needed for pain         OPHTHALMIC EXAM:  Base Eye Exam    Visual Acuity (Snellen - Linear)      Right Left   Dist cc 20/80 +2 HM   Dist ph cc NI NI   Correction: Glasses       Tonometry (Tonopen, 9:28 AM)      Right Left   Pressure 15 15       Pupils      Dark Light Shape React APD   Right 3 2 Round Brisk None   Left 3 2 Round Brisk None       Visual Fields (Counting fingers)      Left Right     Full    Restrictions Total superior temporal, inferior temporal, superior nasal, inferior nasal deficiencies        Neuro/Psych    Oriented x3: Yes   Mood/Affect: Normal       Dilation  Both eyes: 1.0% Mydriacyl, 2.5% Phenylephrine @ 9:28 AM        Slit Lamp and Fundus Exam    Slit Lamp Exam      Right Left   Lids/Lashes Dermatochalasis - upper lid, mild Meibomian gland dysfunction Dermatochalasis - upper lid, mild Meibomian gland dysfunction   Conjunctiva/Sclera Melanosis Melanosis   Cornea 2-3+ Punctate epithelial erosions, well healed temporal cataract wounds 3+ Punctate epithelial erosions, well healed temporal cataract wounds   Anterior Chamber Deep and quiet Deep and quiet   Iris Round and moderately dilated to 5.45m Round and moderately dilated to 671m  Lens PC IOL in good position, trace, cortical remnant temporally, PC folds 2-3+ Nuclear sclerosis, 2-3+ Cortical cataract   Vitreous Vitreous syneresis, vitreous condensations, Ozurdex pellet remnant inferiorly, no residual IVTA Vitreous syneresis       Fundus Exam      Right Left   Disc Trace Pallor, Sharp rim Mild Pallor, Sharp rim, PPP   C/D Ratio 0.5 0.3   Macula Blunted foveal reflex, [place], Retinal pigment epithelial mottling, diffuse edema, CWS inferiorly Flat, Blunted foveal reflex, RPE mottling and clumping, scarring, atrophy   Vessels Vascular attenuation, Tortuous Severe Vascular attenuation, +fibrosis   Periphery Attached    Attached, pavingstone and reticular degeneration inferiorly          IMAGING AND PROCEDURES  Imaging and Procedures for _0 @  OCT, Retina - OU - Both Eyes       Right Eye Quality was good. Central Foveal Thickness: 394. Progression has improved. Findings include abnormal foveal contour, intraretinal fluid, intraretinal hyper-reflective material, no SRF, vitreomacular adhesion  (Mild Interval improvement in IRF/CME).   Left Eye Quality was good. Central Foveal Thickness: 228.  Progression has been stable. Findings include abnormal foveal contour, no IRF, no SRF, outer retinal atrophy, epiretinal membrane, preretinal fibrosis, subretinal hyper-reflective material, pigment epithelial detachment.   Notes *Images captured and stored on drive  Diagnosis / Impression:  OD: CRVO w/ mild interval improvement in IRF/CME OS: diffuse ORA  Clinical management:  See below  Abbreviations: NFP - Normal foveal profile. CME - cystoid macular edema. PED - pigment epithelial detachment. IRF - intraretinal fluid. SRF - subretinal fluid. EZ - ellipsoid zone. ERM - epiretinal membrane. ORA - outer retinal atrophy. ORT - outer retinal tubulation. SRHM - subretinal hyper-reflective material        Intravitreal Injection, Pharmacologic Agent - OD - Right Eye       Time Out 02/04/2019. 10:55 AM. Confirmed correct patient, procedure, site, and patient consented.   Anesthesia Topical anesthesia was used. Anesthetic medications included Lidocaine 2%, Proparacaine 0.5%.   Procedure Preparation included 5% betadine to ocular surface, eyelid speculum. A 27 gauge needle was used.   Injection:  4 mg Triescence 4071mlL injection   NDC: 006915-288-4994ot: 105VA, Expiration date: 05/13/2020   Route: Intravitreal, Site: Right Eye, Waste: 0.9 mL  Post-op Post injection exam found visual acuity of at least counting fingers. The patient tolerated the procedure well. There were no complications. The patient received written and verbal post procedure care education.   Notes An AC tap was performed following injection due to elevated IOP using a 30 gauge needle on a syringe with the plunger removed. The needle was placed at the limbus at 5 oclock and approximately 0.04 cc of aqueous was removed from the anterior chamber. Betadine was applied to the tap area before and after the paracentesis was performed. There were no complications. The patient tolerated the  procedure well. The IOP was  rechecked and was found to be 9 mmHg.                 ASSESSMENT/PLAN:    ICD-10-CM   1. Central retinal vein occlusion with macular edema of both eyes  H34.8130 Intravitreal Injection, Pharmacologic Agent - OD - Right Eye    triamcinolone acetonide (TRIESENCE) 40 MG/ML subtenons injection 4 mg  2. Moderate nonproliferative diabetic retinopathy of both eyes with macular edema associated with type 2 diabetes mellitus ([place])  W10.9323   3. Retinal edema  H35.81 OCT, Retina - OU - Both Eyes  4. Essential hypertension  I10   5. Hypertensive retinopathy of both eyes  H35.033   6. Combined forms of age-related cataract of left eye  H25.812   7. Pseudophakia  Z96.1   8. Primary open angle glaucoma (POAG) of right eye, mild stage  H40.1111     1. CRVO w/ CME OU  - pt of Dr. Zigmund Daniel who wished to transfer care  - OS long-standing low vision (HM) with subfoveal scar and central retinal atrophy  - OD with CME actively being managed with Ozurdex OD ~q5 wks by Dr. Zigmund Daniel (last on 8.20.20) s/p multiple IVTA and Ozurdex OD  - was due for Ozurdex OD w/ Zigmund Daniel on 9.25.20, but underwent cataract surgery OD w/ Kathlen Mody on 9.21.20  - s/p IVTA OD #4 (10.26.20), #5 (12.10.20)  - today BCVA OD 20/80 (stable)  - exam shows no residual IVTA in vitreous cavity  - OCT shows diffuse macular edema slightly improved from prior -- likely multifactorial -- CRVO, DME, post-cataract CME  - recommend IVTA OD #6, 01.21.21  - pt wishes to proceed  - RBA of procedure discussed, questions answered  - informed consent obtained and signed  - see procedure note  - f/u in 6 wks -- DFE/OCT/possible injection  2,3. Severe Non-proliferative diabetic retinopathy OU  - The incidence, risk factors for progression, natural history and treatment options for diabetic retinopathy  were discussed with patient.    - The need for close monitoring of blood glucose, blood pressure, and serum lipids, avoiding cigarette or any  type of tobacco, and the need for long term follow up was also discussed with patient.  - OCT shows diabetic macular edema, OD   - recommend IVTA OD today, 01.21.2021 as above for CRVO  4,5. Hypertensive retinopathy OU  - discussed importance of tight BP control  - monitor  6. Mixed form age related cataract OS  - under the expert management of Dr. Kathlen Mody  7. Pseudophakia OD  - s/p CE/IOL OD (9.21.20) by expert surgeon, Dr. Kathlen Mody  - IOL in excellent position  - had post op IOP spike -- IOP now under better control  - monitor  8. POAG OD  - under the expert management of Dr. Kathlen Mody  - s/p SLT and goniotomy OD  - had post-cataract IOP spike  - IOP 15 today and did post-injection AC tap OD after injecting Triesence   Ophthalmic Meds Ordered this visit:  Meds ordered this encounter  Medications  . triamcinolone acetonide (TRIESENCE) 40 MG/ML subtenons injection 4 mg       Return in about 6 weeks (around 03/18/2019) for f/u CRVO OU, DFE, OCT.  There are no Patient Instructions on file for this visit.   This document serves as a record of services personally performed by Gardiner Sleeper, MD, PhD. It was created on their behalf by Leeann Must,  COA, a certified ophthalmic assistant. The creation of this record is the provider's dictation and/or activities during the visit.    Electronically signed by: Leeann Must, COA _0 @ 11:43 PM   This document serves as a record of services personally performed by Gardiner Sleeper, MD, PhD. It was created on their behalf by Ernest Mallick, OA, an ophthalmic assistant. The creation of this record is the provider's dictation and/or activities during the visit.    Electronically signed by: Ernest Mallick, OA 01.21.2021 11:43 PM    Explained the diagnoses, plan, and follow up with the patient and they expressed understanding.  Patient expressed understanding of the importance of proper follow up care.   Gardiner Sleeper, M.D.,  Ph.D. Diseases & Surgery of the Retina and Vitreous [facility]  I have reviewed the above documentation for accuracy and completeness, and I agree with the above. Gardiner Sleeper, M.D., Ph.D. 02/04/19 11:45 PM    Abbreviations: M myopia (nearsighted); A astigmatism; H hyperopia (farsighted); P presbyopia; Mrx spectacle prescription;  CTL contact lenses; OD right eye; OS left eye; OU both eyes  XT exotropia; ET esotropia; PEK punctate epithelial keratitis; PEE punctate epithelial erosions; DES dry eye syndrome; MGD meibomian gland dysfunction; ATs artificial tears; PFAT's preservative free artificial tears; [place] nuclear sclerotic cataract; PSC posterior subcapsular cataract; ERM epi-retinal membrane; PVD posterior vitreous detachment; RD retinal detachment; DM diabetes mellitus; DR diabetic retinopathy; NPDR non-proliferative diabetic retinopathy; PDR proliferative diabetic retinopathy; CSME clinically significant macular edema; DME diabetic macular edema; dbh dot blot hemorrhages; CWS cotton wool spot; POAG primary open angle glaucoma; C/D cup-to-disc ratio; HVF humphrey visual field; GVF goldmann visual field; OCT optical coherence tomography; IOP intraocular pressure; BRVO Branch retinal vein occlusion; CRVO central retinal vein occlusion; CRAO central retinal artery occlusion; BRAO branch retinal artery occlusion; RT retinal tear; SB scleral buckle; PPV pars plana vitrectomy; VH Vitreous hemorrhage; PRP panretinal laser photocoagulation; IVK intravitreal kenalog; VMT vitreomacular traction; MH Macular hole;  NVD neovascularization of the disc; NVE neovascularization elsewhere; AREDS age related eye disease study; ARMD age related macular degeneration; POAG primary open angle glaucoma; EBMD epithelial/anterior basement membrane dystrophy; ACIOL anterior chamber intraocular lens; IOL intraocular lens; PCIOL posterior chamber intraocular lens; Phaco/IOL phacoemulsification with  intraocular lens placement; [place] photorefractive keratectomy; LASIK laser assisted in situ keratomileusis; HTN hypertension; DM diabetes mellitus; COPD chronic obstructive pulmonary disease

## 2019-01-29 NOTE — Progress Notes (Signed)
.  Chronic Care Management   Initial Visit Note  01/27/2019 Name: Mary [name] MRN: [id number] DOB: 04/27/1945  Referred by: Glendale Chard, MD Reason for referral : Chronic Care Management (Diabetes)   Mary [name] is a 74 y.o. year old female who is a primary care patient of Glendale Chard, MD. The CCM team was consulted for assistance with chronic disease management and care coordination needs related to DMII  Review of patient status, including review of consultants reports, relevant laboratory and other test results, and collaboration with appropriate care team members and the patient's provider was performed as part of comprehensive patient evaluation and provision of chronic care management services.    Patient was contacted via telephone for today's visit regarding her diabetes and chronic care management  Medications: Outpatient Encounter Medications as of 01/27/2019  Medication Sig Note  . ACCU-CHEK AVIVA PLUS test strip See admin instructions. for testing   . aspirin EC 81 MG tablet Take 81 mg by mouth daily.   . Blood Glucose Monitoring Suppl (ACCU-CHEK AVIVA PLUS) w/Device KIT Use as directed to check blood sugars 2 times per day dx: e11.65   . cetirizine (ZYRTEC) 10 MG tablet TAKE 1 TABLET BY MOUTH EVERY DAY   . COMBIGAN 0.2-0.5 % ophthalmic solution Place 1 drop into the right eye 3 (three) times daily.   . fexofenadine (ALLEGRA) 180 MG tablet Take 1 tablet (180 mg total) by mouth daily.   . fluorometholone (FML) 0.1 % ophthalmic suspension Place 1 drop into the right eye 3 (three) times daily.   . fluticasone (FLONASE) 50 MCG/ACT nasal spray SPRAY 1 SPRAY INTO EACH NOSTRIL EVERY DAY   . furosemide (LASIX) 40 MG tablet TAKE 1 TABLET BY MOUTH EVERY DAY   . HYDROcodone-acetaminophen (NORCO) 10-325 MG tablet Take 1 tablet by mouth 5 (five) times daily as needed.   [name] KLOR-CON M20 20 MEQ tablet Take 20 mEq by mouth daily. with food 02/23/2018: Patient instructed to hold  this medication due to abnormal Potassium level of 6.2 on 02/18/18, per MD, Dr. Glendale Chard. Patient was given verbal education related to complications from hypo and hyperkalemia and importance of following MD recommendations. Patient instructed to resume this medication when directed by PCP. Patient verbalizes understanding.   [name] levothyroxine (SYNTHROID, LEVOTHROID) 112 MCG tablet Take 112 mcg by mouth daily before breakfast.    . metoprolol succinate (TOPROL-XL) 50 MG 24 hr tablet Take 50 mg by mouth daily.   [name] omeprazole (PRILOSEC) 20 MG capsule Take 1 capsule (20 mg total) by mouth daily.   [name] omeprazole (PRILOSEC) 20 MG capsule TAKE 1 CAPSULE BY MOUTH  DAILY (Patient not taking: Reported on 12/24/2018)   . OZEMPIC, 0.25 OR 0.5 MG/DOSE, 2 MG/1.5ML SOPN INJECT SUBCUTANEOUSLY 0.5MG ONCE A WEEK.   [name] ROCKLATAN 0.02-0.005 % SOLN Place 1 drop into the right eye at bedtime.   [name] telmisartan (MICARDIS) 80 MG tablet TAKE 1 TABLET BY MOUTH  DAILY   . [DISCONTINUED] Cetirizine HCl 10 MG CAPS Take 1 capsule (10 mg total) by mouth daily. (Patient not taking: Reported on 12/24/2018)   . [DISCONTINUED] Magnesium Oxide -Mg Supplement 400 MG CAPS TAKE 1 CAPSULE BY MOUTH EVERY DAY IN THE EVENING   . [DISCONTINUED] metoprolol tartrate (LOPRESSOR) 50 MG tablet Take 50 mg by mouth daily.   . [DISCONTINUED] pravastatin (PRAVACHOL) 40 MG tablet Take 1 tablet (40 mg total) by mouth daily.    No facility-administered encounter medications on file as of 01/27/2019.  Objective:   Goals Addressed            This Visit's Progress     Patient Stated   . I would like to manage my diabetes (pt-stated)       Current Barriers:  . Diabetes: H8DU; complicated by chronic medical conditions including CKD, HTN, HLD, most recent A1c 5.1% on 06/24/18 . Current antihyperglycemic regimen: Ozempic 0.31m o Complaining of Gi/diarrhea--will hold Ozempic for 2 weeks and restart at 0.290mweekly o Will f/u with patient in  1/27 . Denies hypoglycemic symptoms; denies hyperglycemic symptoms . Current meal patterns: n/a . Current exercise: walking . Current blood glucose readings: FBG 85-110s . Cardiovascular risk reduction: o Current hypertensive regimen: telmisartain, metoprolol o Current hyperlipidemia regimen:  pravastatin 4042mmyopathy with other statin, continue current management) o Current antiplatelet regimen: ASA  Pharmacist Clinical Goal(s):  . OMarland Kitchener the next 90 days, patient will work with PharmD and primary care provider to address needs related to diabetes management  Interventions: . Comprehensive medication review performed, medication list updated in electronic medical record . Reviewed & discussed the following diabetes-related information with patient: o Continue checking blood sugars as directed o Follow ADA recommended "diabetes-friendly" diet  (reviewed healthy snack/food options) o Discussed GLP-1 injection technique; Patient uses AccuChek Aviva glucometer o Reviewed medication purpose/side effects  Patient Self Care Activities:  . Patient will check blood glucose 3x weekly, document, and provide at future appointments . Patient will focus on medication adherence  . Patient will take medications as prescribed . Patient will contact provider with any episodes of hypoglycemia . Patient will report any questions or concerns to provider   Initial goal documentation         Plan:   The care management team will reach out to the patient again over the next 14 days.     Provider Signature  JulRegina EckharmD, BCPS Clinical Pharmacist, [facility] [phone]

## 2019-02-03 NOTE — Patient Instructions (Signed)
Visit Information  Goals Addressed            This Visit's Progress     Patient Stated   . I would like to manage my diabetes (pt-stated)       Current Barriers:  . Diabetes: T2DM; complicated by chronic medical conditions including CKD, HTN, HLD, most recent A1c 5.1% on 06/24/18 . Current antihyperglycemic regimen: Ozempic 0.5mg  o Complaining of Gi/diarrhea--will hold Ozempic for 2 weeks and restart at 0.25mg  weekly o Will f/u with patient in 1/27 . Denies hypoglycemic symptoms; denies hyperglycemic symptoms . Current meal patterns: n/a . Current exercise: walking . Current blood glucose readings: FBG 85-110s . Cardiovascular risk reduction: o Current hypertensive regimen: telmisartain, metoprolol o Current hyperlipidemia regimen:  pravastatin 40mg  (myopathy with other statin, continue current management) o Current antiplatelet regimen: ASA  Pharmacist Clinical Goal(s):  Over the next 90 days, patient will work with PharmD and primary care provider to address needs related to diabetes management  Interventions: . Comprehensive medication review performed, medication list updated in electronic medical record . Reviewed & discussed the following diabetes-related information with patient: o Continue checking blood sugars as directed o Follow ADA recommended "diabetes-friendly" diet  (reviewed healthy snack/food options) o Discussed GLP-1 injection technique; Patient uses AccuChek Aviva glucometer o Reviewed medication purpose/side effects  Patient Self Care Activities:  . Patient will check blood glucose 3x weekly, document, and provide at future appointments . Patient will focus on medication adherence  . Patient will take medications as prescribed . Patient will contact provider with any episodes of hypoglycemia . Patient will report any questions or concerns to provider   Initial goal documentation       Other   . Patient Stated         The patient verbalized  understanding of instructions provided today and declined a print copy of patient instruction materials.   The care management team will reach out to the patient again over the next 14 days.   SIGNATURE [name], PharmD, BCPS Clinical Pharmacist, [facility]  [facility]  Direct Dial: [phone]

## 2019-02-04 ENCOUNTER — Ambulatory Visit (INDEPENDENT_AMBULATORY_CARE_PROVIDER_SITE_OTHER): Payer: Medicare Other | Admitting: Ophthalmology

## 2019-02-04 ENCOUNTER — Encounter (INDEPENDENT_AMBULATORY_CARE_PROVIDER_SITE_OTHER): Payer: Self-pay | Admitting: Ophthalmology

## 2019-02-04 DIAGNOSIS — H25812 Combined forms of age-related cataract, left eye: Secondary | ICD-10-CM

## 2019-02-04 DIAGNOSIS — H401111 Primary open-angle glaucoma, right eye, mild stage: Secondary | ICD-10-CM

## 2019-02-04 DIAGNOSIS — I10 Essential (primary) hypertension: Secondary | ICD-10-CM | POA: Diagnosis not present

## 2019-02-04 DIAGNOSIS — Z961 Presence of intraocular lens: Secondary | ICD-10-CM

## 2019-02-04 DIAGNOSIS — H3581 Retinal edema: Secondary | ICD-10-CM | POA: Diagnosis not present

## 2019-02-04 DIAGNOSIS — H34813 Central retinal vein occlusion, bilateral, with macular edema: Secondary | ICD-10-CM

## 2019-02-04 DIAGNOSIS — E113313 Type 2 diabetes mellitus with moderate nonproliferative diabetic retinopathy with macular edema, bilateral: Secondary | ICD-10-CM | POA: Diagnosis not present

## 2019-02-04 DIAGNOSIS — H35033 Hypertensive retinopathy, bilateral: Secondary | ICD-10-CM

## 2019-02-04 MED ORDER — TRIAMCINOLONE ACETONIDE 40 MG/ML IO SUSP
4.0000 mg | INTRAOCULAR | Status: AC | PRN
  Administered 2019-02-04: 4 mg via INTRAVITREAL

## 2019-02-09 ENCOUNTER — Ambulatory Visit: Payer: Medicare Other | Admitting: Sports Medicine

## 2019-02-09 ENCOUNTER — Other Ambulatory Visit: Payer: Self-pay

## 2019-02-09 ENCOUNTER — Ambulatory Visit: Payer: Self-pay

## 2019-02-09 DIAGNOSIS — N183 Chronic kidney disease, stage 3 unspecified: Secondary | ICD-10-CM

## 2019-02-09 DIAGNOSIS — E1122 Type 2 diabetes mellitus with diabetic chronic kidney disease: Secondary | ICD-10-CM

## 2019-02-09 NOTE — Telephone Encounter (Signed)
The pt said that she has been having a runny nose and that the drainage is sitting in her throat.  The pt said she has been using her nasal spray and the Claritin and that the Flonase dries her out too much but that she has been using.  The pt was told that Dr. Allyne Gee wants the pt to go and have a covid test done to make sure the pt doesn't have the coronavirus.

## 2019-02-09 NOTE — Chronic Care Management (AMB) (Signed)
  Chronic Care Management   Outreach Note  02/09/2019 Name: Mary [name] MRN: [id number] DOB: [birth year]  Referred by: Dorothyann Peng, MD Reason for referral : Care Coordination   SW placed a successful outbound call to the patient to follow up n progression of home repair goal. The patient reports feeling unwell today with congestion. No fever is noted at the time of the call. SW discussed importance of contacting her primary care team to schedule an appointment if symptoms persist. The patient stated "my daughter thinks I should go to the doctor but I do not have transportation and have to give three day notice for it". SW encouraged the patient to contact the primary care office to request a virtual visit and possible schedule an in person appointment if the provider feels it is necessary. The patient agreed to this plan. SW collaborated with practice CMA to communicate patients plan to request a virtual visit due to barriers with transportation.  Advised the patient SW would reschedule care management call to a later date.   Follow Up Plan: The care management team will reach out to the patient again over the next 10 days.   Bevelyn Ngo, BSW, CDP Social Worker, Certified Dementia Practitioner [facility] Care Management [phone]

## 2019-02-10 ENCOUNTER — Telehealth: Payer: Self-pay

## 2019-02-16 ENCOUNTER — Ambulatory Visit (INDEPENDENT_AMBULATORY_CARE_PROVIDER_SITE_OTHER): Payer: Medicare Other | Admitting: Sports Medicine

## 2019-02-16 ENCOUNTER — Encounter: Payer: Self-pay | Admitting: Sports Medicine

## 2019-02-16 ENCOUNTER — Ambulatory Visit: Payer: Self-pay | Admitting: Pharmacist

## 2019-02-16 ENCOUNTER — Other Ambulatory Visit: Payer: Self-pay

## 2019-02-16 VITALS — Temp 97.3°F

## 2019-02-16 DIAGNOSIS — M79674 Pain in right toe(s): Secondary | ICD-10-CM

## 2019-02-16 DIAGNOSIS — E1122 Type 2 diabetes mellitus with diabetic chronic kidney disease: Secondary | ICD-10-CM

## 2019-02-16 DIAGNOSIS — E1142 Type 2 diabetes mellitus with diabetic polyneuropathy: Secondary | ICD-10-CM

## 2019-02-16 DIAGNOSIS — B351 Tinea unguium: Secondary | ICD-10-CM | POA: Diagnosis not present

## 2019-02-16 DIAGNOSIS — I739 Peripheral vascular disease, unspecified: Secondary | ICD-10-CM

## 2019-02-16 DIAGNOSIS — L84 Corns and callosities: Secondary | ICD-10-CM

## 2019-02-16 DIAGNOSIS — M79675 Pain in left toe(s): Secondary | ICD-10-CM

## 2019-02-16 DIAGNOSIS — N183 Chronic kidney disease, stage 3 unspecified: Secondary | ICD-10-CM | POA: Diagnosis not present

## 2019-02-16 DIAGNOSIS — L97511 Non-pressure chronic ulcer of other part of right foot limited to breakdown of skin: Secondary | ICD-10-CM | POA: Diagnosis not present

## 2019-02-16 MED ORDER — SULFAMETHOXAZOLE-TRIMETHOPRIM 400-80 MG PO TABS: 1.0000 | ORAL_TABLET | Freq: Two times a day (BID) | ORAL | 0 refills | Status: DC

## 2019-02-16 NOTE — Progress Notes (Signed)
Subjective: Mary [name] is a 74 y.o. female patient with history of diabetes who presents to office today complaining of long,mildly painful nails and callus while ambulating in shoes; unable to trim. Patient states that the glucose reading this morning was 127 and last A1c 5.1 like before, Patient denies any new changes in medication or new problems except noticing some drainage from the callus on the right. No other issues noted.  Patient Active Problem List   Diagnosis Date Noted  . Diabetes mellitus with stage 3 chronic kidney disease ([place]) 02/21/2018  . 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 Primary osteoarthritis of both knees 02/21/2018  . Class 3 severe obesity due to excess calories with serious comorbidity and body mass index (BMI) of 45.0 to 49.9 in adult ([place]) 02/21/2018  . Skin ulcer of right foot with fat layer exposed ([place]) 02/18/2018  . Morbid (severe) obesity due to excess calories ([place]) 11/18/2017  . Knee pain 07/16/2015  . Lateral dislocation of right patella 05/04/2015  . Rupture of right quadriceps tendon 05/24/2014  . S/P TKR (total knee replacement) 05/05/2012   Current Outpatient Medications on File Prior to Visit  Medication Sig Dispense Refill  . ACCU-CHEK AVIVA PLUS test strip See admin instructions. for testing  11  . aspirin EC 81 MG tablet Take 81 mg by mouth daily.    [name] 28G MISC     . Blood Glucose Monitoring Suppl (ACCU-CHEK AVIVA PLUS) w/Device KIT Use as directed to check blood sugars 2 times per day dx: e11.65 1 kit 1  . cetirizine (ZYRTEC) 10 MG tablet TAKE 1 TABLET BY MOUTH EVERY DAY 90 tablet 1  . COMBIGAN 0.2-0.5 % ophthalmic solution Place 1 drop into the right eye 3 (three) times daily.    . fexofenadine (ALLEGRA) 180 MG tablet Take 1 tablet (180 mg total) by mouth daily. 90 tablet 1  . fluorometholone (FML) 0.1 % ophthalmic suspension Place 1 drop into the right eye 3 (three) times daily.    . fluticasone  (FLONASE) 50 MCG/ACT nasal spray SPRAY 1 SPRAY INTO EACH NOSTRIL EVERY DAY 16 g 1  . furosemide (LASIX) 40 MG tablet TAKE 1 TABLET BY MOUTH EVERY DAY 90 tablet 1  . HYDROcodone-acetaminophen (NORCO) 10-325 MG tablet Take 1 tablet by mouth 5 (five) times daily as needed.    [name] KLOR-CON M20 20 MEQ tablet Take 20 mEq by mouth daily. with food  1  . levothyroxine (SYNTHROID, LEVOTHROID) 112 MCG tablet Take 112 mcg by mouth daily before breakfast.     . Magnesium Oxide -Mg Supplement 400 MG CAPS TAKE 1 CAPSULE BY MOUTH EVERY DAY IN THE EVENING 90 capsule 1  . metoprolol succinate (TOPROL-XL) 50 MG 24 hr tablet Take 50 mg by mouth daily.  6  . omeprazole (PRILOSEC) 20 MG capsule Take 1 capsule (20 mg total) by mouth daily. 90 capsule 1  . omeprazole (PRILOSEC) 20 MG capsule TAKE 1 CAPSULE BY MOUTH  DAILY 90 capsule 3  . OZEMPIC, 0.25 OR 0.5 MG/DOSE, 2 MG/1.5ML SOPN INJECT SUBCUTANEOUSLY 0.5MG ONCE A WEEK. 4.5 mL 3  . pravastatin (PRAVACHOL) 40 MG tablet Take 1 tablet (40 mg total) by mouth daily. 90 tablet 1  . prednisoLONE acetate (PRED FORTE) 1 % ophthalmic suspension Place 1 drop into the right eye 4 (four) times daily.    [name] ROCKLATAN 0.02-0.005 % SOLN Place 1 drop into the right eye at bedtime.    [name] telmisartan (MICARDIS) 80  MG tablet TAKE 1 TABLET BY MOUTH  DAILY 90 tablet 3   No current facility-administered medications on file prior to visit.   No Known Allergies  No results found for this or any previous visit (from the past 2160 hour(s)).  Objective: General: Patient is awake, alert, and oriented x 3 and in no acute distress.  Integument: Skin is warm, dry and supple bilateral. Nails are tender, long, thickened and  dystrophic with subungual debris, consistent with onychomycosis, 1-5 bilateral. No signs of infection. + sub met 1 on the left & 5 preulcerative lesions present bilateral.  Keratotic lesion right submet 1 wants to parotid revealing a less than 0.5 cm ulceration with macerated  margins and granular base there is scant malodor to the area bloody drainage no purulence but there is significant warmth to the forefoot and some swelling, remaining integument unremarkable.  Vasculature:  Dorsalis Pedis pulse 1/4 bilateral. Posterior Tibial pulse  0/4 bilateral.  Capillary fill time <3 sec 1-5 bilateral. Scant hair growth to the level of the digits. Temperature gradient within normal limits. No varicosities present bilateral. Chronic edema present bilateral.   Neurology: The patient has absent sensation measured with a 5.07/10g Semmes Weinstein Monofilament at all pedal sites bilateral . Vibratory sensation absent bilateral with tuning fork. No Babinski sign present bilateral.   Musculoskeletal: Asymptomatic bunion, pes planus, and hammertoe pedal deformities noted bilateral. Muscular strength 4/5 in all lower extremity muscular groups bilateral without pain on range of motion . No tenderness with calf compression bilateral.  Assessment and Plan: Problem List Items Addressed This Visit    None    Visit Diagnoses    Foot ulcer, right, limited to breakdown of skin (HCC)    -  Primary   Callus       PVD (peripheral vascular disease) ([place])       Pain due to onychomycosis of toenails of both feet       Relevant Medications   sulfamethoxazole-trimethoprim (BACTRIM) 400-80 MG tablet   Diabetic polyneuropathy associated with type 2 diabetes mellitus ([place])          -Examined patient. -Re-discussed and educated patient on diabetic foot care, especially with regards to the vascular, neurological and musculoskeletal systems.  -Mechanically debrided  Callus x 3 using sterile chisel blade and all nails 1-5 bilateral using sterile nail nipper and filed with dremel without incident  - Excisionally dedbrided ulceration at submet 1 on the right to healthy bleeding borders removing nonviable tissue using a sterile chisel blade. Wound measures post debridement as above.  Wound was debrided  to the level of the dermis with viable wound base exposed to promote healing. Hemostasis was achieved with manuel pressure. Patient tolerated procedure well without any discomfort or anesthesia necessary for this wound debridement.  -Applied Betadine and dry sterile dressing and instructed patient to continue with daily dressings at home consisting of the same -Advised patient to wear shoes and has to provide additional offloading -Prescribe Bactrim for patient to take meanwhile we will await the culture results to see if there needs to be a change with antibiotic - Advised patient to go to the ER or return to office if the wound worsens or if constitutional symptoms are present. -Answered all patient questions -Patient to return in 2 weeks for wound care on the right -Patient advised to call the office if any problems or questions arise in the meantime.  Landis Martins, DPM

## 2019-02-16 NOTE — Addendum Note (Signed)
Addended by: Anne Ng H on: 02/16/2019 02:40 PM   Modules accepted: Orders

## 2019-02-17 ENCOUNTER — Telehealth: Payer: Self-pay

## 2019-02-18 ENCOUNTER — Telehealth: Payer: Self-pay

## 2019-02-18 ENCOUNTER — Ambulatory Visit: Payer: Self-pay

## 2019-02-18 ENCOUNTER — Other Ambulatory Visit: Payer: Self-pay

## 2019-02-18 DIAGNOSIS — N183 Chronic kidney disease, stage 3 unspecified: Secondary | ICD-10-CM

## 2019-02-18 DIAGNOSIS — E1122 Type 2 diabetes mellitus with diabetic chronic kidney disease: Secondary | ICD-10-CM

## 2019-02-18 MED ORDER — FUROSEMIDE 40 MG PO TABS: 40.0000 mg | ORAL_TABLET | Freq: Every day | ORAL | 1 refills | Status: DC

## 2019-02-18 NOTE — Chronic Care Management (AMB) (Signed)
  Chronic Care Management   Outreach Note  02/18/2019 Name: Mary [name] MRN: [id number] DOB: [birth year]  Referred by: Dorothyann Peng, MD Reason for referral : Care Coordination   SW placed an unsuccessful outbound call to assess progression of patient stated goals and assist with care coordination needs. SW left a HIPAA compliant voice message requesting a return call.  Follow Up Plan: The care management team will reach out to the patient again over the next 14 days.   Bevelyn Ngo, BSW, CDP Social Worker, Certified Dementia Practitioner [facility] Care Management [phone]

## 2019-02-19 ENCOUNTER — Telehealth: Payer: Self-pay

## 2019-02-19 NOTE — Progress Notes (Signed)
  Chronic Care Management   Outreach Note  02/15/2019 Name: Mary [name] MRN: [id number] DOB: Jun 19, 1945  Referred by: Dorothyann Peng, MD Reason for referral : Chronic Care Management   An unsuccessful telephone outreach was attempted today. The patient was referred to the case management team by for assistance with care management and care coordination.  Patient stated she was at another doctor's appt and would call me back.  Follow Up Plan: The care management team will reach out to the patient again over the next 30 days.   SIGNATURE Kieth Brightly, PharmD, BCPS Clinical Pharmacist, [facility]  [facility]  Direct Dial: [phone]

## 2019-02-20 LAB — WOUND CULTURE
MICRO NUMBER:: 10106853
SPECIMEN QUALITY:: ADEQUATE

## 2019-02-22 ENCOUNTER — Other Ambulatory Visit: Payer: Self-pay

## 2019-02-22 MED ORDER — FUROSEMIDE 40 MG PO TABS: 40.0000 mg | ORAL_TABLET | Freq: Every day | ORAL | 1 refills | Status: DC

## 2019-02-26 ENCOUNTER — Ambulatory Visit: Payer: Self-pay

## 2019-02-26 ENCOUNTER — Telehealth: Payer: Self-pay

## 2019-02-26 DIAGNOSIS — N183 Chronic kidney disease, stage 3 unspecified: Secondary | ICD-10-CM

## 2019-02-26 DIAGNOSIS — E1122 Type 2 diabetes mellitus with diabetic chronic kidney disease: Secondary | ICD-10-CM

## 2019-02-26 NOTE — Patient Instructions (Signed)
Social Worker Visit Information  Goals we discussed today:  Goals Addressed            This Visit's Progress     Patient Stated   . "I would like to make my bathroom safer to use" (pt-stated)   Not on track    Current Barriers:  . Financial constraints related to cost of home modifications . Lacks knowledge of community resource: [employer] . Limited ability to qualify for programs due to home being in the patient and her deceased sons name . Limited ability to safely perform ADL's due to DM and CKD III  Social Work Clinical Goal(s):  [name] Over the next 60 days the patient will work with SW to become more knowledgeable of resources to assist with home modification needs.  . New 02/26/19- Over the next 30 days the patient will follow up with legal aide as directed by SW to obtain assistance with updating the deed to her home  CCM SW Interventions: Completed 02/26/19 . Outbound call placed to the patient to review progression of patient stated goal . Determined the patient has not completed intake call with legal aide . Encouraged the patient to contact legal aide over the next week to obtain assistance with updating her home deed . Reviewed barrier to obtaining home modification assistance until the deed is updated . Scheduled follow up call over the next month to assist with care coordination needs  Patient Self Care Activities:  . Self administers medications as prescribed . Calls pharmacy for medication refills . Calls provider office for new concerns or questions  Please see past updates related to this goal by clicking on the "Past Updates" button in the selected goal          Follow Up Plan: SW will follow up with patient by phone over the next month.   Bevelyn Ngo, BSW, CDP Social Worker, Certified Dementia Practitioner [facility] Care Management [phone]

## 2019-02-26 NOTE — Chronic Care Management (AMB) (Signed)
Chronic Care Management    Social Work Follow Up Note  02/26/2019 Name: Mary [name] MRN: [id number] DOB: 10-29-1945  Mary [name] is a 74 y.o. year old female who is a primary care patient of Glendale Chard, MD. The CCM team was consulted for assistance with care coordination.   Review of patient status, including review of consultants reports, other relevant assessments, and collaboration with appropriate care team members and the patient's provider was performed as part of comprehensive patient evaluation and provision of chronic care management services.    SW placed a successful outbound call to the patient to assist with care coordination and assess goal progression.  Outpatient Encounter Medications as of 02/26/2019  Medication Sig Note  . ACCU-CHEK AVIVA PLUS test strip See admin instructions. for testing   . aspirin EC 81 MG tablet Take 81 mg by mouth daily.   [name] 28G MISC    . Blood Glucose Monitoring Suppl (ACCU-CHEK AVIVA PLUS) w/Device KIT Use as directed to check blood sugars 2 times per day dx: e11.65   . cetirizine (ZYRTEC) 10 MG tablet TAKE 1 TABLET BY MOUTH EVERY DAY   . COMBIGAN 0.2-0.5 % ophthalmic solution Place 1 drop into the right eye 3 (three) times daily.   . fexofenadine (ALLEGRA) 180 MG tablet Take 1 tablet (180 mg total) by mouth daily.   . fluorometholone (FML) 0.1 % ophthalmic suspension Place 1 drop into the right eye 3 (three) times daily.   . fluticasone (FLONASE) 50 MCG/ACT nasal spray SPRAY 1 SPRAY INTO EACH NOSTRIL EVERY DAY   . furosemide (LASIX) 40 MG tablet Take 1 tablet (40 mg total) by mouth daily.   [name] HYDROcodone-acetaminophen (NORCO) 10-325 MG tablet Take 1 tablet by mouth 5 (five) times daily as needed.   [name] KLOR-CON M20 20 MEQ tablet Take 20 mEq by mouth daily. with food 02/23/2018: Patient instructed to hold this medication due to abnormal Potassium level of 6.2 on 02/18/18, per MD, Dr. Glendale Chard. Patient was given  verbal education related to complications from hypo and hyperkalemia and importance of following MD recommendations. Patient instructed to resume this medication when directed by PCP. Patient verbalizes understanding.   [name] levothyroxine (SYNTHROID, LEVOTHROID) 112 MCG tablet Take 112 mcg by mouth daily before breakfast.    . Magnesium Oxide -Mg Supplement 400 MG CAPS TAKE 1 CAPSULE BY MOUTH EVERY DAY IN THE EVENING   . metoprolol succinate (TOPROL-XL) 50 MG 24 hr tablet Take 50 mg by mouth daily.   [name] omeprazole (PRILOSEC) 20 MG capsule Take 1 capsule (20 mg total) by mouth daily.   [name] omeprazole (PRILOSEC) 20 MG capsule TAKE 1 CAPSULE BY MOUTH  DAILY   . OZEMPIC, 0.25 OR 0.5 MG/DOSE, 2 MG/1.5ML SOPN INJECT SUBCUTANEOUSLY 0.5MG ONCE A WEEK.   . pravastatin (PRAVACHOL) 40 MG tablet Take 1 tablet (40 mg total) by mouth daily.   . prednisoLONE acetate (PRED FORTE) 1 % ophthalmic suspension Place 1 drop into the right eye 4 (four) times daily.   [name] ROCKLATAN 0.02-0.005 % SOLN Place 1 drop into the right eye at bedtime.   . sulfamethoxazole-trimethoprim (BACTRIM) 400-80 MG tablet Take 1 tablet by mouth 2 (two) times daily.   [name] telmisartan (MICARDIS) 80 MG tablet TAKE 1 TABLET BY MOUTH  DAILY    No facility-administered encounter medications on file as of 02/26/2019.     Goals Addressed            This Visit's Progress  Patient Stated   . "I would like to make my bathroom safer to use" (pt-stated)   Not on track    Current Barriers:  . Financial constraints related to cost of home modifications . Lacks knowledge of community resource: [employer] . Limited ability to qualify for programs due to home being in the patient and her deceased sons name . Limited ability to safely perform ADL's due to DM and CKD III  Social Work Clinical Goal(s):  [name] Over the next 60 days the patient will work with SW to become more knowledgeable of resources to assist with home modification needs.    . New 02/26/19- Over the next 30 days the patient will follow up with legal aide as directed by SW to obtain assistance with updating the deed to her home  CCM SW Interventions: Completed 02/26/19 . Outbound call placed to the patient to review progression of patient stated goal . Determined the patient has not completed intake call with legal aide . Encouraged the patient to contact legal aide over the next week to obtain assistance with updating her home deed . Reviewed barrier to obtaining home modification assistance until the deed is updated . Scheduled follow up call over the next month to assist with care coordination needs  Patient Self Care Activities:  . Self administers medications as prescribed . Calls pharmacy for medication refills . Calls provider office for new concerns or questions  Please see past updates related to this goal by clicking on the "Past Updates" button in the selected goal          Follow Up Plan: SW will follow up with patient by phone over the next month.   [name], BSW, CDP Social Worker, Certified Dementia Practitioner [place] [facility] [place] [facility] [phone]  Total time spent performing care coordination and/or care management activities with the patient by phone or face to face = 7 minutes.

## 2019-03-01 ENCOUNTER — Ambulatory Visit (INDEPENDENT_AMBULATORY_CARE_PROVIDER_SITE_OTHER): Payer: Medicare Other | Admitting: Pharmacist

## 2019-03-01 DIAGNOSIS — E1122 Type 2 diabetes mellitus with diabetic chronic kidney disease: Secondary | ICD-10-CM | POA: Diagnosis not present

## 2019-03-01 DIAGNOSIS — N183 Chronic kidney disease, stage 3 unspecified: Secondary | ICD-10-CM | POA: Diagnosis not present

## 2019-03-02 ENCOUNTER — Encounter: Payer: Self-pay | Admitting: Sports Medicine

## 2019-03-02 ENCOUNTER — Other Ambulatory Visit: Payer: Self-pay

## 2019-03-02 ENCOUNTER — Ambulatory Visit (INDEPENDENT_AMBULATORY_CARE_PROVIDER_SITE_OTHER): Payer: Medicare Other | Admitting: Sports Medicine

## 2019-03-02 DIAGNOSIS — L97511 Non-pressure chronic ulcer of other part of right foot limited to breakdown of skin: Secondary | ICD-10-CM

## 2019-03-02 DIAGNOSIS — L84 Corns and callosities: Secondary | ICD-10-CM | POA: Diagnosis not present

## 2019-03-02 DIAGNOSIS — I739 Peripheral vascular disease, unspecified: Secondary | ICD-10-CM | POA: Diagnosis not present

## 2019-03-02 DIAGNOSIS — E1142 Type 2 diabetes mellitus with diabetic polyneuropathy: Secondary | ICD-10-CM | POA: Diagnosis not present

## 2019-03-02 NOTE — Progress Notes (Signed)
Chronic Care Management   Visit Note  03/01/2019 Name: Mary [name] MRN: [id number] DOB: 1945-08-23  Referred by: Glendale Chard, MD Reason for referral : Chronic Care Management   Mary [name] is a 74 y.o. year old female who is a primary care patient of Glendale Chard, MD. The CCM team was consulted for assistance with chronic disease management and care coordination needs related to DMII  Review of patient status, including review of consultants reports, relevant laboratory and other test results, and collaboration with appropriate care team members and the patient's provider was performed as part of comprehensive patient evaluation and provision of chronic care management services.    SDOH (Social Determinants of Health) screening performed today: None. See Care Plan for related entries.   Medications: Outpatient Encounter Medications as of 03/01/2019  Medication Sig Note  . ACCU-CHEK AVIVA PLUS test strip See admin instructions. for testing   . aspirin EC 81 MG tablet Take 81 mg by mouth daily.   [name] 28G MISC    . Blood Glucose Monitoring Suppl (ACCU-CHEK AVIVA PLUS) w/Device KIT Use as directed to check blood sugars 2 times per day dx: e11.65   . cetirizine (ZYRTEC) 10 MG tablet TAKE 1 TABLET BY MOUTH EVERY DAY   . COMBIGAN 0.2-0.5 % ophthalmic solution Place 1 drop into the right eye 3 (three) times daily.   . fexofenadine (ALLEGRA) 180 MG tablet Take 1 tablet (180 mg total) by mouth daily.   . fluorometholone (FML) 0.1 % ophthalmic suspension Place 1 drop into the right eye 3 (three) times daily.   . fluticasone (FLONASE) 50 MCG/ACT nasal spray SPRAY 1 SPRAY INTO EACH NOSTRIL EVERY DAY   . furosemide (LASIX) 40 MG tablet Take 1 tablet (40 mg total) by mouth daily.   [name] HYDROcodone-acetaminophen (NORCO) 10-325 MG tablet Take 1 tablet by mouth 5 (five) times daily as needed.   [name] KLOR-CON M20 20 MEQ tablet Take 20 mEq by mouth daily. with food 02/23/2018:  Patient instructed to hold this medication due to abnormal Potassium level of 6.2 on 02/18/18, per MD, Dr. [place]end[place] Chard. Patient was given verbal education related to complications from hypo and hyperkalemia and importance of following MD recommendations. Patient instructed to resume this medication when directed by PCP. Patient verbalizes understanding.   [name] levothyroxine (SYNTHROID, LEVOTHROID) 112 MCG tablet Take 112 mcg by mouth daily before breakfast.    . Magnesium Oxide -Mg Supplement 400 MG CAPS TAKE 1 CAPSULE BY MOUTH EVERY DAY IN THE EVENING   . metoprolol succinate (TOPROL-XL) 50 MG 24 hr tablet Take 50 mg by mouth daily.   [name] omeprazole (PRILOSEC) 20 MG capsule Take 1 capsule (20 mg total) by mouth daily.   [name] omeprazole (PRILOSEC) 20 MG capsule TAKE 1 CAPSULE BY MOUTH  DAILY   . OZEMPIC, 0.25 OR 0.5 MG/DOSE, 2 MG/1.5ML SOPN INJECT SUBCUTANEOUSLY 0.5MG ONCE A WEEK.   . pravastatin (PRAVACHOL) 40 MG tablet Take 1 tablet (40 mg total) by mouth daily.   . prednisoLONE acetate (PRED FORTE) 1 % ophthalmic suspension Place 1 drop into the right eye 4 (four) times daily.   [name] ROCKLATAN 0.02-0.005 % SOLN Place 1 drop into the right eye at bedtime.   . sulfamethoxazole-trimethoprim (BACTRIM) 400-80 MG tablet Take 1 tablet by mouth 2 (two) times daily.   [name] telmisartan (MICARDIS) 80 MG tablet TAKE 1 TABLET BY MOUTH  DAILY    No facility-administered encounter medications on file as of 03/01/2019.  Objective:   Goals Addressed            This Visit's Progress     Patient Stated   . I would like to manage my diabetes (pt-stated)       Current Barriers:  . Diabetes: I1UY; complicated by chronic medical conditions including CKD, HTN, HLD, most recent A1c 5.1% on 06/24/18 . Current antihyperglycemic regimen: Ozempic 0.81m weekly o Gi/diarrhea resolved--Ozempic 0.281mrestarted o Will f/u with patient in 1-63-monthDenies hypoglycemic symptoms; denies hyperglycemic symptoms . Current meal  patterns: n/a . Current exercise: walking . Current blood glucose readings: FBG 100-120s . Cardiovascular risk reduction: o Current hypertensive regimen: telmisartain, metoprolol o Current hyperlipidemia regimen:  pravastatin 40m60myopathy with other statin, continue current management) o Current antiplatelet regimen: ASA  Pharmacist Clinical Goal(s):  . OvMarland Kitchenr the next 90 days, patient will work with PharmD and primary care provider to address needs related to diabetes management  Interventions: . Comprehensive medication review performed, medication list updated in electronic medical record . Reviewed & discussed the following diabetes-related information with patient: o Continue checking blood sugars as directed o Follow ADA recommended "diabetes-friendly" diet  (reviewed healthy snack/food options) o Discussed GLP-1 injection technique; Patient uses AccuChek Aviva glucometer o Reviewed medication purpose/side effects  Patient Self Care Activities:  . Patient will check blood glucose 3x weekly, document, and provide at future appointments . Patient will focus on medication adherence  . Patient will take medications as prescribed . Patient will contact provider with any episodes of hypoglycemia . Patient will report any questions or concerns to provider   Initial goal documentation          Plan:   The care management team will reach out to the patient again over the next 45 days.   Provider Signature   JuliRegina EckarmD, BCPS Clinical Pharmacist, [phone]

## 2019-03-02 NOTE — Patient Instructions (Signed)
Visit Information  Goals Addressed            This Visit's Progress     Patient Stated   . I would like to manage my diabetes (pt-stated)       Current Barriers:  . Diabetes: T2DM; complicated by chronic medical conditions including CKD, HTN, HLD, most recent A1c 5.1% on 06/24/18 . Current antihyperglycemic regimen: Ozempic 0.25mg  weekly o Gi/diarrhea resolved--Ozempic 0.25mg  restarted o Will f/u with patient in 79-month . Denies hypoglycemic symptoms; denies hyperglycemic symptoms . Current meal patterns: n/a . Current exercise: walking . Current blood glucose readings: FBG 100-120s . Cardiovascular risk reduction: o Current hypertensive regimen: telmisartain, metoprolol o Current hyperlipidemia regimen:  pravastatin 40mg  (myopathy with other statin, continue current management) o Current antiplatelet regimen: ASA  Pharmacist Clinical Goal(s):  Over the next 90 days, patient will work with PharmD and primary care provider to address needs related to diabetes management  Interventions: . Comprehensive medication review performed, medication list updated in electronic medical record . Reviewed & discussed the following diabetes-related information with patient: o Continue checking blood sugars as directed o Follow ADA recommended "diabetes-friendly" diet  (reviewed healthy snack/food options) o Discussed GLP-1 injection technique; Patient uses AccuChek Aviva glucometer o Reviewed medication purpose/side effects  Patient Self Care Activities:  . Patient will check blood glucose 3x weekly, document, and provide at future appointments . Patient will focus on medication adherence  . Patient will take medications as prescribed . Patient will contact provider with any episodes of hypoglycemia . Patient will report any questions or concerns to provider   Initial goal documentation        The patient verbalized understanding of instructions provided today and declined a print  copy of patient instruction materials.   The care management team will reach out to the patient again over the next 45 days.   SIGNATURE [name], PharmD, BCPS Clinical Pharmacist, [facility]  [facility]  Direct Dial: [phone]

## 2019-03-02 NOTE — Progress Notes (Signed)
Subjective: Mary [name] is a 74 y.o. female patient seen in office for evaluation of ulceration of the Right foot. Patient reports that the area on right foot remains dry and has not had any problems, no drainage. Denies nausea/fever/vomiting/chills/night sweats/shortness of breath/pain. Patient  Reports that she has a few more tablets of her antibiotics and forgot to take Lasix so has had more swelling, Patient has no other pedal complaints at this time.  FBS 109 today and A1c 4.9.   Patient Active Problem List   Diagnosis Date Noted  . Diabetes mellitus with stage 3 chronic kidney disease ([place]) 02/21/2018  . 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 Primary osteoarthritis of both knees 02/21/2018  . Class 3 severe obesity due to excess calories with serious comorbidity and body mass index (BMI) of 45.0 to 49.9 in adult ([place]) 02/21/2018  . Skin ulcer of right foot with fat layer exposed ([place]) 02/18/2018  . Morbid (severe) obesity due to excess calories ([place]) 11/18/2017  . Knee pain 07/16/2015  . Lateral dislocation of right patella 05/04/2015  . Rupture of right quadriceps tendon 05/24/2014  . S/P TKR (total knee replacement) 05/05/2012   Current Outpatient Medications on File Prior to Visit  Medication Sig Dispense Refill  . ACCU-CHEK AVIVA PLUS test strip See admin instructions. for testing  11  . aspirin EC 81 MG tablet Take 81 mg by mouth daily.    [name] 28G MISC     . Blood Glucose Monitoring Suppl (ACCU-CHEK AVIVA PLUS) w/Device KIT Use as directed to check blood sugars 2 times per day dx: e11.65 1 kit 1  . cetirizine (ZYRTEC) 10 MG tablet TAKE 1 TABLET BY MOUTH EVERY DAY 90 tablet 1  . COMBIGAN 0.2-0.5 % ophthalmic solution Place 1 drop into the right eye 3 (three) times daily.    . fexofenadine (ALLEGRA) 180 MG tablet Take 1 tablet (180 mg total) by mouth daily. 90 tablet 1  . fluorometholone (FML) 0.1 % ophthalmic suspension Place 1 drop into the right  eye 3 (three) times daily.    . fluticasone (FLONASE) 50 MCG/ACT nasal spray SPRAY 1 SPRAY INTO EACH NOSTRIL EVERY DAY 16 g 1  . furosemide (LASIX) 40 MG tablet Take 1 tablet (40 mg total) by mouth daily. 90 tablet 1  . HYDROcodone-acetaminophen (NORCO) 10-325 MG tablet Take 1 tablet by mouth 5 (five) times daily as needed.    [name] KLOR-CON M20 20 MEQ tablet Take 20 mEq by mouth daily. with food  1  . levothyroxine (SYNTHROID, LEVOTHROID) 112 MCG tablet Take 112 mcg by mouth daily before breakfast.     . Magnesium Oxide -Mg Supplement 400 MG CAPS TAKE 1 CAPSULE BY MOUTH EVERY DAY IN THE EVENING 90 capsule 1  . metoprolol succinate (TOPROL-XL) 50 MG 24 hr tablet Take 50 mg by mouth daily.  6  . omeprazole (PRILOSEC) 20 MG capsule Take 1 capsule (20 mg total) by mouth daily. 90 capsule 1  . omeprazole (PRILOSEC) 20 MG capsule TAKE 1 CAPSULE BY MOUTH  DAILY 90 capsule 3  . OZEMPIC, 0.25 OR 0.5 MG/DOSE, 2 MG/1.5ML SOPN INJECT SUBCUTANEOUSLY 0.5MG ONCE A WEEK. 4.5 mL 3  . pravastatin (PRAVACHOL) 40 MG tablet Take 1 tablet (40 mg total) by mouth daily. 90 tablet 1  . prednisoLONE acetate (PRED FORTE) 1 % ophthalmic suspension Place 1 drop into the right eye 4 (four) times daily.    [name] ROCKLATAN 0.02-0.005 % SOLN Place 1 drop into the  right eye at bedtime.    . sulfamethoxazole-trimethoprim (BACTRIM) 400-80 MG tablet Take 1 tablet by mouth 2 (two) times daily. 28 tablet 0  . telmisartan (MICARDIS) 80 MG tablet TAKE 1 TABLET BY MOUTH  DAILY 90 tablet 3   No current facility-administered medications on file prior to visit.   No Known Allergies  Recent Results (from the past 2160 hour(s))  WOUND CULTURE     Status: Abnormal   Collection Time: 02/16/19 10:45 AM   Specimen: Abscess; Wound  Result Value Ref Range   MICRO NUMBER: [id number]    SPECIMEN QUALITY: Adequate    SOURCE: WOUND (SITE NOT SPECIFIED)    STATUS: FINAL    GRAM STAIN:      No white blood cells seen Few epithelial cells Many Gram  positive cocci in pairs Moderate Gram positive bacilli Moderate Gram negative bacilli   ISOLATE 1: Staphylococcus aureus (A)     Comment: Staphylococcus aureus      Susceptibility   Staphylococcus aureus - AEROBIC CULT, GRAM STAIN POSITIVE 1    VANCOMYCIN 1 Sensitive     CIPROFLOXACIN <=0.5 Sensitive     CLINDAMYCIN <=0.25 Sensitive     LEVOFLOXACIN 0.25 Sensitive     ERYTHROMYCIN <=0.25 Sensitive     GENTAMICIN <=0.5 Sensitive     OXACILLIN* <=0.25 Sensitive      * Oxacillin-susceptible staphylococci aresusceptible to other penicillinase-stablepenicillins (e.g. Methicillin, Nafcillin), beta-lactam/beta-lactamase inhibitor combinations, andcephems with staphylococcal indications, includingCefazolin.    TETRACYCLINE <=1 Sensitive     TRIMETH/SULFA* <=10 Sensitive      * Oxacillin-susceptible staphylococci aresusceptible to other penicillinase-stablepenicillins (e.g. Methicillin, Nafcillin), beta-lactam/beta-lactamase inhibitor combinations, andcephems with staphylococcal indications, includingCefazolin.Legend:S = Susceptible  I = IntermediateR = Resistant  NS = Not susceptible* = Not tested  NR = Not reported**NN = See antimicrobic comments    Objective: There were no vitals filed for this visit.  General: Patient is awake, alert, oriented x 3 and in no acute distress.  Dermatology: Skin is warm and dry bilateral with a callus sub met 1 on right, no opening one keratotic skin was removed, There is no malodor, no active drainage, no erythema, no edema. No acute signs of infection.  Callus sub met 1 on left and Bilateral hallux and sub met 5 on left with no signs of infection  Nails x 10 mildly elongated and thickened consistent with oncyhomycosis   Vascular: Dorsalis Pedis pulse = 1/4 Bilateral,  Posterior Tibial pulse = 0/4 Bilateral,  Capillary Fill Time < 5 seconds, Lymphedema bilateral  Neurologic: Protective absent bilateral  Musculosketal: No Pain with palpation to healed ulcer  on right. No pain with compression to calves bilateral. + Bunion, hammertoe, pes planus bilateral.  No results for input(s): GRAMSTAIN, LABORGA in the last 8760 hours.  Assessment and Plan:  Problem List Items Addressed This Visit    None    Visit Diagnoses    Foot ulcer, right, limited to breakdown of skin ([place])    -  Primary   Pre-ulcerative calluses       PVD (peripheral vascular disease) ([place])       Relevant Medications   metoprolol tartrate (LOPRESSOR) 50 MG tablet   Diabetic polyneuropathy associated with type 2 diabetes mellitus ([place])          -Examined patient and discussed the progression of the healed wound and treatment alternatives. -Mechanically dedbrided callus bilateral using a sterile chisel blade, no opening to right foot. No dressing needed -Continue with bactrim until completed -  At no additional charge mechanically debrided nails using sterile nail nipper without incident  -Advised patient to continue to wear her shoes to keep pressure off callus/preulcerative areas -Advised patient to wear compression stockings and to take Lasix for edema control -Patient to return to office in 4 weeks for wound/callus check or sooner if problems arise.  Landis Martins, DPM

## 2019-03-16 NOTE — Progress Notes (Addendum)
[name]  03/19/2019     CHIEF COMPLAINT Patient presents for Retina Follow Up   HISTORY OF PRESENT ILLNESS: Mary [name] is a 74 y.o. female who presents to the clinic today for:   HPI    Retina Follow Up    Patient presents with  CRVO/BRVO.  In both eyes.  This started weeks ago.  Severity is moderate.  Duration of weeks.  Since onset it is stable.  I, the attending physician,  performed the HPI with the patient and updated documentation appropriately.          Comments    Pt states her vision is about the same.  She denies eye pain or discomfort and denies any new or worsening floaters or fol OU.       Last edited by Mary Caffey, MD on 03/19/2019 10:25 AM. (History)    pt states she had no problems with the injection at last visit, she states she saw floaters afterwards, but does not see them anymore  Referring physician: Glendale Chard, MD [address] [place],  [place] [address]  HISTORICAL INFORMATION:   Selected notes from the [name]: Current Outpatient Medications (Ophthalmic Drugs)  Medication Sig  . COMBIGAN 0.2-0.5 % ophthalmic solution Place 1 drop into the right eye 3 (three) times daily.  . dorzolamide-timolol (COSOPT) 22.3-6.8 MG/ML ophthalmic solution Place 1 drop into the right eye 2 (two) times daily.  . fluorometholone (FML) 0.1 % ophthalmic suspension Place 1 drop into the right eye 3 (three) times daily.  . prednisoLONE acetate (PRED FORTE) 1 % ophthalmic suspension Place 1 drop into the right eye 4 (four) times daily.  [name] ROCKLATAN 0.02-0.005 % SOLN Place 1 drop into the right eye at bedtime.   No current facility-administered medications for this visit. (Ophthalmic Drugs)   Current Outpatient Medications (Other)  Medication Sig  . ACCU-CHEK AVIVA PLUS test strip See admin instructions. for testing  . aspirin EC 81 MG tablet Take 81 mg by mouth daily.  [name] Comfort Lancets 28G MISC   . Blood Glucose Monitoring Suppl (ACCU-CHEK AVIVA PLUS) w/Device KIT Use as directed to check blood sugars 2 times per day dx: e11.65  . cetirizine (ZYRTEC) 10 MG tablet TAKE 1 TABLET BY MOUTH EVERY DAY  . fexofenadine (ALLEGRA) 180 MG tablet Take 1 tablet (180 mg total) by mouth daily.  . fluticasone (FLONASE) 50 MCG/ACT nasal spray SPRAY 1 SPRAY INTO EACH NOSTRIL EVERY DAY  . furosemide (LASIX) 40 MG tablet Take 1 tablet (40 mg total) by mouth daily.  [name] HYDROcodone-acetaminophen (NORCO) 10-325 MG tablet Take 1 tablet by mouth 5 (five) times daily as needed.  [name] KLOR-CON M20 20 MEQ tablet Take 20 mEq by mouth daily. with food  . levothyroxine (SYNTHROID, LEVOTHROID) 112 MCG tablet Take 112 mcg by mouth daily before breakfast.   . Magnesium Oxide -Mg Supplement 400 MG CAPS TAKE 1 CAPSULE BY MOUTH EVERY DAY IN THE EVENING  . metoprolol succinate (TOPROL-XL) 50 MG 24 hr tablet Take 50 mg by mouth daily.  . metoprolol tartrate (LOPRESSOR) 50 MG tablet Take 50 mg by mouth daily.  [name] omeprazole (PRILOSEC) 20 MG capsule Take 1 capsule (20 mg total) by mouth daily.  [name] omeprazole (PRILOSEC) 20 MG capsule TAKE 1 CAPSULE BY MOUTH  DAILY  . OZEMPIC, 0.25 OR 0.5 MG/DOSE, 2 MG/1.5ML SOPN INJECT SUBCUTANEOUSLY 0.5MG ONCE A WEEK.  [name]  pravastatin (PRAVACHOL) 40 MG tablet Take 1 tablet (40 mg total) by mouth daily.  [name] sulfamethoxazole-trimethoprim (BACTRIM) 400-80 MG tablet Take 1 tablet by mouth 2 (two) times daily.  [name] telmisartan (MICARDIS) 80 MG tablet TAKE 1 TABLET BY MOUTH  DAILY   No current facility-administered medications for this visit. (Other)      REVIEW OF SYSTEMS: ROS    Positive for: Musculoskeletal, Endocrine, Eyes   Negative for: Constitutional, Gastrointestinal, Neurological, Skin, Genitourinary, HENT, Cardiovascular, Respiratory, Psychiatric, Allergic/Imm, Heme/Lymph   Last edited by [name] on 03/19/2019 10:04 AM. (History)        ALLERGIES No Known Allergies  PAST MEDICAL HISTORY Past Medical History:  Diagnosis Date  . Arthritis    osteoarthritis  . Cataract    OS  . Diabetes mellitus    Insulin dependent  . Diabetic retinopathy ([name])    NPDR OU  . Glaucoma    POAG OD  . H/O cardiovascular stress test    pt. reports 10/13- was normal per pt.   . Hyperlipemia   . Hypertension   . Hypertensive retinopathy    OU   Past Surgical History:  Procedure Laterality Date  . CARPAL TUNNEL RELEASE Bilateral   . CATARACT EXTRACTION Right 10/05/2018   Dr. Kathlen [name]  . EYE SURGERY Right    Cat Sx  . KNEE ARTHROSCOPY     right  . QUADRICEPS TENDON REPAIR  02/17/2012   Procedure: REPAIR QUADRICEP TENDON;  Surgeon: Mary Huger, MD;  Location: [name];  Service: Orthopedics;  Laterality: Right;  right quadriceps repair with latera release  . QUADRICEPS TENDON REPAIR Right 05/15/2012   Procedure: REPAIR QUADRICEP TENDON;  Surgeon: Mary Huger, MD;  Location: [name];  Service: Orthopedics;  Laterality: Right;  . TOTAL KNEE ARTHROPLASTY  10/21/2011   rt tk  . TOTAL KNEE ARTHROPLASTY  10/21/2011   Procedure: TOTAL KNEE ARTHROPLASTY;  Surgeon: Mary Haskell, MD;  Location: [name];  Service: Orthopedics;  Laterality: Right;  . TUBAL LIGATION      FAMILY HISTORY Family History  Problem Relation Age of Onset  . Hypertension Mother   . Heart Problems Father   . Gout Father   . Arthritis Father     SOCIAL HISTORY Social History   Tobacco Use  . Smoking status: Former Smoker    Packs/day: 0.25    Years: 20.00    Pack years: 5.00    Types: Cigarettes    Quit date: 06/28/2014    Years since quitting: 4.7  . Smokeless tobacco: Never Used  Substance Use Topics  . Alcohol use: No  . Drug use: Yes    Types: Oxycodone    Comment: Uses as needed for pain         OPHTHALMIC EXAM:  Base Eye Exam    Visual Acuity (Snellen - Linear)      Right Left   Dist cc 20/80 +1 HM   Dist ph cc NI NI   Correction:  Glasses       Tonometry (Tonopen, 10:08 AM)      Right Left   Pressure 09 09       Pupils      Dark Light Shape React APD   Right 2 1 Round Minimal 0   Left 2 1 Round Minimal 0       Visual Fields      Left Right     Full   Restrictions Total superior temporal, inferior temporal, superior nasal,  inferior nasal deficiencies        Extraocular Movement      Right Left    Full Full       Neuro/Psych    Oriented x3: Yes   Mood/Affect: Normal       Dilation    Both eyes: 1.0% Mydriacyl, 2.5% Phenylephrine @ 10:08 AM        Slit Lamp and Fundus Exam    Slit Lamp Exam      Right Left   Lids/Lashes Dermatochalasis - upper lid, mild Meibomian gland dysfunction Dermatochalasis - upper lid, mild Meibomian gland dysfunction   Conjunctiva/Sclera Melanosis Melanosis   Cornea 2-3+ Punctate epithelial erosions, well healed temporal cataract wounds 3+ Punctate epithelial erosions, well healed temporal cataract wounds   Anterior Chamber Deep and quiet Deep and quiet   Iris Round and moderately dilated to 5.1m Round and moderately dilated to 647m  Lens PC IOL in good position, trace, cortical remnant temporally, PC folds 2-3+ Nuclear sclerosis, 2-3+ Cortical cataract   Vitreous Vitreous syneresis, vitreous condensations, Ozurdex pellet remnant inferiorly, no residual IVTA Vitreous syneresis       Fundus Exam      Right Left   Disc Trace Pallor, Sharp rim Mild Pallor, Sharp rim, PPP   C/D Ratio 0.5 0.3   Macula Blunted foveal reflex, Drus[place], Retinal pigment epithelial mottling, diffuse edema -- slightly improved, CWS inferiorly Flat, Blunted foveal reflex, RPE mottling and clumping, scarring, atrophy, no heme   Vessels Vascular attenuation, Tortuous Severe Vascular attenuation, +fibrosis   Periphery Attached    Attached, pavingstone and reticular degeneration inferiorly, scattered RPE atrophy          IMAGING AND PROCEDURES  Imaging and Procedures for _0 @  OCT, Retina -  OU - Both Eyes       Right Eye Quality was good. Central Foveal Thickness: 372. Progression has improved. Findings include abnormal foveal contour, intraretinal fluid, intraretinal hyper-reflective material, no SRF, vitreomacular adhesion  (Mild Interval improvement in IRF/CME).   Left Eye Quality was good. Central Foveal Thickness: 286. Progression has been stable. Findings include abnormal foveal contour, no IRF, no SRF, outer retinal atrophy, epiretinal membrane, preretinal fibrosis, subretinal hyper-reflective material, pigment epithelial detachment (Stable from prior).   Notes *Images captured and stored on drive  Diagnosis / Impression:  OD: CRVO w/ mild interval improvement in CME OS: diffuse ORA - Stable from prior  Clinical management:  See below  Abbreviations: NFP - Normal foveal profile. CME - cystoid macular edema. PED - pigment epithelial detachment. IRF - intraretinal fluid. SRF - subretinal fluid. EZ - ellipsoid zone. ERM - epiretinal membrane. ORA - outer retinal atrophy. ORT - outer retinal tubulation. SRHM - subretinal hyper-reflective material        Intravitreal Injection, Pharmacologic Agent - OD - Right Eye       Time Out 03/19/2019. 10:38 AM. Confirmed correct patient, procedure, site, and patient consented.   Anesthesia Topical anesthesia was used. Anesthetic medications included Lidocaine 2%, Proparacaine 0.5%.   Procedure Preparation included 5% betadine to ocular surface, eyelid speculum. A supplied needle was used.   Injection:  4 mg Triescence 4089mlL injection   NDC: [id number]: 108LM, Expiration date: 07/13/2020   Route: Intravitreal, Site: Right Eye, Waste: 0.09 mL  Post-op Post injection exam found visual acuity of at least counting fingers. The patient tolerated the procedure well. There were no complications. The patient received written and verbal post procedure care education.   Notes [facility]  tap was performed following injection  due to elevated IOP using a 30 gauge needle on a syringe with the plunger removed. The needle was placed at the limbus at 7 oclock and approximately 0.07 cc of aqueous was removed from the anterior chamber. Betadine was applied to the tap area before and after the paracentesis was performed. There were no complications. The patient tolerated the procedure well. The IOP was rechecked and was found to be ~10 mmHg by tonopen.                 ASSESSMENT/PLAN:    ICD-10-CM   1. Central retinal vein occlusion with macular edema of both eyes  H34.8130 Intravitreal Injection, Pharmacologic Agent - OD - Right Eye    triamcinolone acetonide (TRIESENCE) 40 MG/ML subtenons injection 4 mg  2. Moderate nonproliferative diabetic retinopathy of both eyes with macular edema associated with type 2 diabetes mellitus ([place])  Q22.2979   3. Retinal edema  H35.81 OCT, Retina - OU - Both Eyes  4. Essential hypertension  I10   5. Hypertensive retinopathy of both eyes  H35.033   6. Combined forms of age-related cataract of left eye  H25.812   7. Pseudophakia  Z96.1   8. Primary open angle glaucoma (POAG) of right eye, mild stage  H40.1111     1. CRVO w/ CME OU  - pt of Dr. Zigmund Daniel who wished to transfer care  - OS long-standing low vision (HM) with subfoveal scar and central retinal atrophy  - OD with CME actively being managed with Ozurdex OD ~q5 wks by Dr. Zigmund Daniel (last on 8.20.20) s/p multiple IVTA and Ozurdex OD  - was due for Ozurdex OD w/ Zigmund Daniel on 9.25.20, but underwent cataract surgery OD w/ [name] on 9.21.20  - s/p IVTA OD #4 (10.26.20), #5 (12.10.20), #6 (01.21.21)  - today BCVA OD 20/80 (stable)  - exam shows no residual IVTA in vitreous cavity  - OCT shows diffuse macular edema slightly improved from prior -- likely multifactorial -- CRVO, DME, post-cataract CME  - recommend IVTA OD #7 today, 3.5.21  - pt wishes to proceed  - RBA of procedure discussed, questions answered  - informed  consent obtained and signed  - see procedure note -- AC tap  - f/u in 6 wks -- DFE/OCT/possible injection  2,3. Severe Non-proliferative diabetic retinopathy OU  - The incidence, risk factors for progression, natural history and treatment options for diabetic retinopathy  were discussed with patient.    - The need for close monitoring of blood glucose, blood pressure, and serum lipids, avoiding cigarette or any type of tobacco, and the need for long term follow up was also discussed with patient.  - OCT shows diabetic macular edema, OD   - recommend IVTA OD today, 3.5.21 as above for CRVO  4,5. Hypertensive retinopathy OU  - discussed importance of tight BP control  - monitor  6. Mixed form age related cataract OS  - under the expert management of Dr. Kathlen [name]  7. Pseudophakia OD  - s/p CE/IOL OD (9.21.20) by expert surgeon, Dr. Kathlen [name]  - IOL in excellent position  - had post op IOP spike -- IOP now under better control  - monitor  8. POAG OD  - under the expert management of Dr. Kathlen [name]  - s/p SLT and goniotomy OD  - had post-cataract IOP spike  Ophthalmic Meds Ordered this visit:  Meds ordered this encounter  Medications  . triamcinolone acetonide (TRIESENCE) 40 MG/ML subtenons injection 4  mg       Return in about 6 weeks (around 04/30/2019) for 6 wk f/u for CRVO w/mac edema OU.  There are no Patient Instructions on file for this visit.   Explained the diagnoses, plan, and follow up with the patient and they expressed understanding.  Patient expressed understanding of the importance of proper follow up care.   This document serves as a record of services personally performed by Gardiner Sleeper, MD, PhD. It was created on their behalf by Ernest Mallick, OA, an ophthalmic assistant. The creation of this record is the provider's dictation and/or activities during the visit.    Electronically signed by: Ernest Mallick, OA 03.02.2021 11:22 PM   Gardiner Sleeper, M.D.,  Ph.D. Diseases & Surgery of the Retina and Vitreous [facility]  I have reviewed the above documentation for accuracy and completeness, and I agree with the above. Gardiner Sleeper, M.D., Ph.D. 03/20/19 11:22 PM   Abbreviations: M myopia (nearsighted); A astigmatism; H hyperopia (farsighted); P presbyopia; Mrx spectacle prescription;  CTL contact lenses; OD right eye; OS left eye; OU both eyes  XT exotropia; ET esotropia; PEK punctate epithelial keratitis; PEE punctate epithelial erosions; DES dry eye syndrome; MGD meibomian gland dysfunction; ATs artificial tears; PFAT's preservative free artificial tears; [place] nuclear sclerotic cataract; PSC posterior subcapsular cataract; ERM epi-retinal membrane; PVD posterior vitreous detachment; RD retinal detachment; DM diabetes mellitus; DR diabetic retinopathy; NPDR non-proliferative diabetic retinopathy; PDR proliferative diabetic retinopathy; CSME clinically significant macular edema; DME diabetic macular edema; dbh dot blot hemorrhages; CWS cotton wool spot; POAG primary open angle glaucoma; C/D cup-to-disc ratio; HVF humphrey visual field; GVF goldmann visual field; OCT optical coherence tomography; IOP intraocular pressure; BRVO Branch retinal vein occlusion; CRVO central retinal vein occlusion; CRAO central retinal artery occlusion; BRAO branch retinal artery occlusion; RT retinal tear; SB scleral buckle; PPV pars plana vitrectomy; VH Vitreous hemorrhage; PRP panretinal laser photocoagulation; IVK intravitreal kenalog; VMT vitreomacular traction; MH Macular hole;  NVD neovascularization of the disc; NVE neovascularization elsewhere; AREDS age related eye disease study; ARMD age related macular degeneration; POAG primary open angle glaucoma; EBMD epithelial/anterior basement membrane dystrophy; ACIOL anterior chamber intraocular lens; IOL intraocular lens; PCIOL posterior chamber intraocular lens; Phaco/IOL phacoemulsification with  intraocular lens placement; [place] photorefractive keratectomy; LASIK laser assisted in situ keratomileusis; HTN hypertension; DM diabetes mellitus; COPD chronic obstructive pulmonary disease

## 2019-03-19 ENCOUNTER — Encounter (INDEPENDENT_AMBULATORY_CARE_PROVIDER_SITE_OTHER): Payer: Self-pay | Admitting: Ophthalmology

## 2019-03-19 ENCOUNTER — Ambulatory Visit (INDEPENDENT_AMBULATORY_CARE_PROVIDER_SITE_OTHER): Payer: Medicare Other | Admitting: Ophthalmology

## 2019-03-19 DIAGNOSIS — H25812 Combined forms of age-related cataract, left eye: Secondary | ICD-10-CM

## 2019-03-19 DIAGNOSIS — H3581 Retinal edema: Secondary | ICD-10-CM

## 2019-03-19 DIAGNOSIS — I10 Essential (primary) hypertension: Secondary | ICD-10-CM | POA: Diagnosis not present

## 2019-03-19 DIAGNOSIS — H401111 Primary open-angle glaucoma, right eye, mild stage: Secondary | ICD-10-CM

## 2019-03-19 DIAGNOSIS — H34813 Central retinal vein occlusion, bilateral, with macular edema: Secondary | ICD-10-CM

## 2019-03-19 DIAGNOSIS — E113313 Type 2 diabetes mellitus with moderate nonproliferative diabetic retinopathy with macular edema, bilateral: Secondary | ICD-10-CM | POA: Diagnosis not present

## 2019-03-19 DIAGNOSIS — H35033 Hypertensive retinopathy, bilateral: Secondary | ICD-10-CM

## 2019-03-19 DIAGNOSIS — Z961 Presence of intraocular lens: Secondary | ICD-10-CM

## 2019-03-19 MED ORDER — TRIAMCINOLONE ACETONIDE 40 MG/ML IO SUSP
4.0000 mg | INTRAOCULAR | Status: AC | PRN
  Administered 2019-03-19: 4 mg via INTRAVITREAL

## 2019-03-21 ENCOUNTER — Other Ambulatory Visit: Payer: Self-pay | Admitting: Internal Medicine

## 2019-03-24 ENCOUNTER — Telehealth: Payer: Self-pay

## 2019-03-30 ENCOUNTER — Ambulatory Visit: Payer: Self-pay

## 2019-03-30 DIAGNOSIS — N183 Chronic kidney disease, stage 3 unspecified: Secondary | ICD-10-CM

## 2019-03-30 DIAGNOSIS — E1122 Type 2 diabetes mellitus with diabetic chronic kidney disease: Secondary | ICD-10-CM

## 2019-03-30 NOTE — Chronic Care Management (AMB) (Signed)
Chronic Care Management    Social Work Follow Up Note  03/30/2019 Name: [name] MRN: [id number] DOB: 1945-03-24  [name] is a 74 y.o. year old female who is a primary care patient of Mary Chard, MD. The CCM team was consulted for assistance with care coordination.   Review of patient status, including review of consultants reports, other relevant assessments, and collaboration with appropriate care team members and the patient's provider was performed as part of comprehensive patient evaluation and provision of chronic care management services.    SDOH (Social Determinants of Health) assessments performed: No    Outpatient Encounter Medications as of 03/30/2019  Medication Sig Note  . ACCU-CHEK AVIVA PLUS test strip See admin instructions. for testing   . aspirin EC 81 MG tablet Take 81 mg by mouth daily.   [name] Comfort Lancets 28G MISC    . Blood Glucose Monitoring Suppl (ACCU-CHEK AVIVA PLUS) w/Device KIT Use as directed to check blood sugars 2 times per day dx: e11.65   . cetirizine (ZYRTEC) 10 MG tablet TAKE 1 TABLET BY MOUTH EVERY DAY   . COMBIGAN 0.2-0.5 % ophthalmic solution Place 1 drop into the right eye 3 (three) times daily.   . dorzolamide-timolol (COSOPT) 22.3-6.8 MG/ML ophthalmic solution Place 1 drop into the right eye 2 (two) times daily.   . fexofenadine (ALLEGRA) 180 MG tablet Take 1 tablet (180 mg total) by mouth daily.   . fluorometholone (FML) 0.1 % ophthalmic suspension Place 1 drop into the right eye 3 (three) times daily.   . fluticasone (FLONASE) 50 MCG/ACT nasal spray SPRAY 1 SPRAY INTO EACH NOSTRIL EVERY DAY   . furosemide (LASIX) 40 MG tablet Take 1 tablet (40 mg total) by mouth daily.   [name] HYDROcodone-acetaminophen (NORCO) 10-325 MG tablet Take 1 tablet by mouth 5 (five) times daily as needed.   [name] KLOR-CON M20 20 MEQ tablet Take 20 mEq by mouth daily. with food 02/23/2018: Patient instructed to hold this medication due to abnormal  Potassium level of 6.2 on 02/18/18, per MD, Dr. Glendale [name]. Patient was given verbal education related to complications from hypo and hyperkalemia and importance of following MD recommendations. Patient instructed to resume this medication when directed by PCP. Patient verbalizes understanding.   [name] levothyroxine (SYNTHROID, LEVOTHROID) 112 MCG tablet Take 112 mcg by mouth daily before breakfast.    . Magnesium Oxide -Mg Supplement 400 MG CAPS TAKE 1 CAPSULE BY MOUTH EVERY DAY IN THE EVENING   . metoprolol succinate (TOPROL-XL) 50 MG 24 hr tablet Take 50 mg by mouth daily.   . metoprolol tartrate (LOPRESSOR) 50 MG tablet Take 50 mg by mouth daily.   [name] omeprazole (PRILOSEC) 20 MG capsule Take 1 capsule (20 mg total) by mouth daily.   [name] omeprazole (PRILOSEC) 20 MG capsule TAKE 1 CAPSULE BY MOUTH  DAILY   . OZEMPIC, 0.25 OR 0.5 MG/DOSE, 2 MG/1.5ML SOPN INJECT SUBCUTANEOUSLY 0.5MG ONCE A WEEK.   . pravastatin (PRAVACHOL) 40 MG tablet Take 1 tablet (40 mg total) by mouth daily.   . prednisoLONE acetate (PRED FORTE) 1 % ophthalmic suspension Place 1 drop into the right eye 4 (four) times daily.   [name] ROCKLATAN 0.02-0.005 % SOLN Place 1 drop into the right eye at bedtime.   . sulfamethoxazole-trimethoprim (BACTRIM) 400-80 MG tablet Take 1 tablet by mouth 2 (two) times daily.   [name] telmisartan (MICARDIS) 80 MG tablet TAKE 1 TABLET BY MOUTH  DAILY    No facility-administered  encounter medications on file as of 03/30/2019.     Goals Addressed            This Visit's Progress     Patient Stated   . COMPLETED: "I would like to make my bathroom safer to use" (pt-stated)       Current Barriers:  . Financial constraints related to cost of home modifications . Lacks knowledge of community resource: [name] . Limited ability to qualify for programs due to home being in the patient and her deceased sons name . Limited ability to safely perform ADL's due to DM and CKD III  Social Work  Clinical Goal(s):  [name] Over the next 60 days the patient will work with SW to become more knowledgeable of resources to assist with home modification needs.  . New 02/26/19- Over the next 30 days the patient will follow up with legal aide as directed by SW to obtain assistance with updating the deed to her home  CCM SW Interventions: Completed 03/30/2019 . Outbound call placed to the patient to review progression of patient stated goal . Determined the patient has yet to contact legal aide . Reviewed the importance of contacting legal aide regarding deed to the home prior to the patient being able to receive home modification assistance o The patient stated understanding . SW advised the patient to contact SW once she has the deed changed if she would still like assistance . SW to close goal due to patients inability to progress  Patient Self Care Activities:  . Self administers medications as prescribed . Calls pharmacy for medication refills . Calls provider office for new concerns or questions  Please see past updates related to this goal by clicking on the "Past Updates" button in the selected goal        Other   . COMPLETED: Collaborate with RN Conservator, museum/gallery to perform appropriate assessments to determined care management and care coordination needs       Current Barriers:  [name] Knowledge barriers related to the independent self-health management of chronic conditions  Social Work Clinical Goal(s):  [name] Over the next 30 days the patients will work with RN Case Manager to establish an individualized plan of care related to the management ofpatients identified chronic conditions.  Interventions: . Patient interviewed and appropriate assessments performed . Assessed patient understanding of current chronic medical conditions. The patient identifies conditions as kidney disease and diabetes . Assessed for patient ability to afford prescribed medications. The patient identifies some  difficulty with recent prescription of Allegra but reports ability to afford certrizine. No other prescription cost concerns identified at this time o Patient educated on role of embedded PharmD should costs become a concern . Collaboration with RN Case Manager regarding patient enrollment into CM program o Patient identifies interest in obtaining an electric bed, if determined to be beneficial to the patient o Communication with RN Case Manager regarding patient stated interest in an electric bed   Patient Self Care Activities:  . Self administers medications as prescribed . Performs ADL's independently . Calls provider office for new concerns or questions  Initial goal documentation         Follow Up Plan: No SW follow up planned at this time. SW has advised the patient to contact SW should resource assistance be needed in the future.   [name], BSW, CDP Social Worker, Certified Dementia Practitioner [place] [facility] [phone]  Total time spent performing care coordination and/or care  management activities with the patient by phone or face to face = 8 minutes.

## 2019-03-30 NOTE — Patient Instructions (Signed)
Social Worker Visit Information  Goals we discussed today:  Goals Addressed            This Visit's Progress     Patient Stated   . COMPLETED: "I would like to make my bathroom safer to use" (pt-stated)       Current Barriers:  . Financial constraints related to cost of home modifications . Lacks knowledge of community resource: [employer] . Limited ability to qualify for programs due to home being in the patient and her deceased sons name . Limited ability to safely perform ADL's due to DM and CKD III  Social Work Clinical Goal(s):  [name] Over the next 60 days the patient will work with SW to become more knowledgeable of resources to assist with home modification needs.  . New 02/26/19- Over the next 30 days the patient will follow up with legal aide as directed by SW to obtain assistance with updating the deed to her home  CCM SW Interventions: Completed 03/30/2019 . Outbound call placed to the patient to review progression of patient stated goal . Determined the patient has yet to contact legal aide . Reviewed the importance of contacting legal aide regarding deed to the home prior to the patient being able to receive home modification assistance o The patient stated understanding . SW advised the patient to contact SW once she has the deed changed if she would still like assistance . SW to close goal due to patients inability to progress  Patient Self Care Activities:  . Self administers medications as prescribed . Calls pharmacy for medication refills . Calls provider office for new concerns or questions  Please see past updates related to this goal by clicking on the "Past Updates" button in the selected goal        Other   . COMPLETED: Collaborate with RN Engineering geologist to perform appropriate assessments to determined care management and care coordination needs       Current Barriers:  [name] Knowledge barriers related to the independent self-health management  of chronic conditions  Social Work Clinical Goal(s):  [name] Over the next 30 days the patients will work with RN Case Manager to establish an individualized plan of care related to the management ofpatients identified chronic conditions.  Interventions: . Patient interviewed and appropriate assessments performed . Assessed patient understanding of current chronic medical conditions. The patient identifies conditions as kidney disease and diabetes . Assessed for patient ability to afford prescribed medications. The patient identifies some difficulty with recent prescription of Allegra but reports ability to afford certrizine. No other prescription cost concerns identified at this time o Patient educated on role of embedded PharmD should costs become a concern . Collaboration with RN Case Manager regarding patient enrollment into CM program o Patient identifies interest in obtaining an electric bed, if determined to be beneficial to the patient o Communication with RN Case Manager regarding patient stated interest in an electric bed   Patient Self Care Activities:  . Self administers medications as prescribed . Performs ADL's independently . Calls provider office for new concerns or questions  Initial goal documentation          Follow Up Plan: No SW follow up planned at this time. Please contact me with future resource needs.   Bevelyn Ngo, BSW, CDP Social Worker, Certified Dementia Practitioner [facility] Care Management [phone]

## 2019-03-31 ENCOUNTER — Telehealth: Payer: Self-pay

## 2019-04-06 ENCOUNTER — Ambulatory Visit (INDEPENDENT_AMBULATORY_CARE_PROVIDER_SITE_OTHER): Payer: Medicare Other | Admitting: Internal Medicine

## 2019-04-06 ENCOUNTER — Other Ambulatory Visit: Payer: Self-pay

## 2019-04-06 ENCOUNTER — Encounter: Payer: Self-pay | Admitting: Internal Medicine

## 2019-04-06 VITALS — BP 146/88 | HR 83 | Temp 98.2°F | Ht 67.0 in | Wt 293.8 lb

## 2019-04-06 DIAGNOSIS — N183 Chronic kidney disease, stage 3 unspecified: Secondary | ICD-10-CM | POA: Diagnosis not present

## 2019-04-06 DIAGNOSIS — J0100 Acute maxillary sinusitis, unspecified: Secondary | ICD-10-CM | POA: Diagnosis not present

## 2019-04-06 DIAGNOSIS — I129 Hypertensive chronic kidney disease with stage 1 through stage 4 chronic kidney disease, or unspecified chronic kidney disease: Secondary | ICD-10-CM

## 2019-04-06 DIAGNOSIS — Z20822 Contact with and (suspected) exposure to covid-19: Secondary | ICD-10-CM

## 2019-04-06 DIAGNOSIS — E1122 Type 2 diabetes mellitus with diabetic chronic kidney disease: Secondary | ICD-10-CM | POA: Diagnosis not present

## 2019-04-06 DIAGNOSIS — Z6841 Body Mass Index (BMI) 40.0 and over, adult: Secondary | ICD-10-CM

## 2019-04-06 MED ORDER — TRIAMCINOLONE ACETONIDE 40 MG/ML IJ SUSP
40.0000 mg | Freq: Once | INTRAMUSCULAR | Status: AC
  Administered 2019-04-06: 40 mg via INTRAMUSCULAR

## 2019-04-06 MED ORDER — AMOXICILLIN-POT CLAVULANATE 500-125 MG PO TABS: 500.0000 mg | ORAL_TABLET | Freq: Two times a day (BID) | ORAL | 0 refills | Status: DC

## 2019-04-06 MED ORDER — CEFTRIAXONE SODIUM 1 G IJ SOLR
1.0000 g | Freq: Once | INTRAMUSCULAR | Status: AC
  Administered 2019-04-06: 1 g via INTRAMUSCULAR

## 2019-04-06 NOTE — Patient Instructions (Addendum)
Exercises To Do While Sitting  Exercises that you do while sitting (chair exercises) can give you many of the same benefits as full exercise. Benefits include strengthening your heart, burning calories, and keeping muscles and joints healthy. Exercise can also improve your mood and help with depression and anxiety. You may benefit from chair exercises if you are unable to do standing exercises because of:  Diabetic foot pain.  Obesity.  Illness.  Arthritis.  Recovery from surgery or injury.  Breathing problems.  Balance problems.  Another type of disability. Before starting chair exercises, check with your health care provider or a physical therapist to find out how much exercise you can tolerate and which exercises are safe for you. If your health care provider approves:  Start out slowly and build up over time. Aim to work up to about 10-20 minutes for each exercise session.  Make exercise part of your daily routine.  Drink water when you exercise. Do not wait until you are thirsty. Drink every 10-15 minutes.  Stop exercising right away if you have pain, nausea, shortness of breath, or dizziness.  If you are exercising in a wheelchair, make sure to lock the wheels.  Ask your health care provider whether you can do tai chi or yoga. Many positions in these mind-body exercises can be modified to do while seated. Warm-up Before starting other exercises: 1. Sit up as straight as you can. Have your knees bent at 90 degrees, which is the shape of the capital letter "L." Keep your feet flat on the floor. 2. Sit at the front edge of your chair, if you can. 3. Pull in (tighten) the muscles in your abdomen and stretch your spine and neck as straight as you can. Hold this position for a few minutes. 4. Breathe in and out evenly. Try to concentrate on your breathing, and relax your mind. Stretching Exercise A: Arm stretch 1. Hold your arms out straight in front of your body. 2. Bend  your hands at the wrist with your fingers pointing up, as if signaling someone to stop. Notice the slight tension in your forearms as you hold the position. 3. Keeping your arms out and your hands bent, rotate your hands outward as far as you can and hold this stretch. Aim to have your thumbs pointing up and your pinkie fingers pointing down. Slowly repeat arm stretches for one minute as tolerated. Exercise B: Leg stretch 1. If you can move your legs, try to "draw" letters on the floor with the toes of your foot. Write your name with one foot. 2. Write your name with the toes of your other foot. Slowly repeat the movements for one minute as tolerated. Exercise C: Reach for the sky 1. Reach your hands as far over your head as you can to stretch your spine. 2. Move your hands and arms as if you are climbing a rope. Slowly repeat the movements for one minute as tolerated. Range of motion exercises Exercise A: Shoulder roll 1. Let your arms hang loosely at your sides. 2. Lift just your shoulders up toward your ears, then let them relax back down. 3. When your shoulders feel loose, rotate your shoulders in backward and forward circles. Do shoulder rolls slowly for one minute as tolerated. Exercise B: March in place 1. As if you are marching, pump your arms and lift your legs up and down. Lift your knees as high as you can. ? If you are unable to lift your knees,  just pump your arms and move your ankles and feet up and down. March in place for one minute as tolerated. Exercise C: Seated jumping jacks 1. Let your arms hang down straight. 2. Keeping your arms straight, lift them up over your head. Aim to point your fingers to the ceiling. 3. While you lift your arms, straighten your legs and slide your heels along the floor to your sides, as wide as you can. 4. As you bring your arms back down to your sides, slide your legs back together. ? If you are unable to use your legs, just move your  arms. Slowly repeat seated jumping jacks for one minute as tolerated. Strengthening exercises Exercise A: Shoulder squeeze 1. Hold your arms straight out from your body to your sides, with your elbows bent and your fists pointed at the ceiling. 2. Keeping your arms in the bent position, move them forward so your elbows and forearms meet in front of your face. 3. Open your arms back out as wide as you can with your elbows still bent, until you feel your shoulder blades squeezing together. Hold for 5 seconds. Slowly repeat the movements forward and backward for one minute as tolerated. Contact a health care provider if you:  Had to stop exercising due to any of the following: ? Pain. ? Nausea. ? Shortness of breath. ? Dizziness. ? Fatigue.  Have significant pain or soreness after exercising. Get help right away if you have:  Chest pain.  Difficulty breathing. These symptoms may represent a serious problem that is an emergency. Do not wait to see if the symptoms will go away. Get medical help right away. Call your local emergency services (911 in the [place] Do not drive yourself to the hospital. This information is not intended to replace advice given to you by your health care provider. Make sure you discuss any questions you have with your health care provider. Document Revised: 04/23/2018 Document Reviewed: 11/13/2016 Elsevier Patient Education  2020 [employer]  

## 2019-04-07 LAB — HEMOGLOBIN A1C
Est. average glucose Bld gHb Est-mCnc: 97 mg/dL
Hgb A1c MFr Bld: 5 % (ref 4.8–5.6)

## 2019-04-07 LAB — CMP14+EGFR
ALT: 11 IU/L (ref 0–32)
AST: 14 IU/L (ref 0–40)
Albumin/Globulin Ratio: 1.2 (ref 1.2–2.2)
Albumin: 4.4 g/dL (ref 3.7–4.7)
Alkaline Phosphatase: 109 IU/L (ref 39–117)
BUN/Creatinine Ratio: 19 (ref 12–28)
BUN: 25 mg/dL (ref 8–27)
Bilirubin Total: 0.6 mg/dL (ref 0.0–1.2)
CO2: 24 mmol/L (ref 20–29)
Calcium: 9.8 mg/dL (ref 8.7–10.3)
Chloride: 105 mmol/L (ref 96–106)
Creatinine, Ser: 1.33 mg/dL — ABNORMAL HIGH (ref 0.57–1.00)
GFR calc Af Amer: 46 mL/min/{1.73_m2} — ABNORMAL LOW (ref >59)
GFR calc non Af Amer: 40 mL/min/{1.73_m2} — ABNORMAL LOW (ref >59)
Globulin, Total: 3.6 g/dL (ref 1.5–4.5)
Glucose: 103 mg/dL — ABNORMAL HIGH (ref 65–99)
Potassium: 4.5 mmol/L (ref 3.5–5.2)
Sodium: 142 mmol/L (ref 134–144)
Total Protein: 8 g/dL (ref 6.0–8.5)

## 2019-04-07 LAB — NOVEL CORONAVIRUS, NAA: SARS-CoV-2, NAA: NOT DETECTED

## 2019-04-07 LAB — SARS-COV-2, NAA 2 DAY TAT

## 2019-04-08 ENCOUNTER — Telehealth: Payer: Self-pay

## 2019-04-08 NOTE — Telephone Encounter (Signed)
The pt called and said that she still feels the same as the day of her visit with a lot of mucous. The pt was told that DR. Allyne Gee wanted me to ask the pt did she get her antibiotic from the pharmacy.  The pt said yes, that she was calling to report how she was feeling as instructed at her visit.

## 2019-04-08 NOTE — Telephone Encounter (Signed)
-----   Message from Dorothyann Peng, MD sent at 04/07/2019  5:44 PM EDT ----- Your kidney fxn is stable. Your a1c is awesome at 5.0! How is she feeling ?Has she started antibiotics?

## 2019-04-12 NOTE — Progress Notes (Signed)
This visit occurred during the SARS-CoV-2 public health emergency.  Safety protocols were in place, including screening questions prior to the visit, additional usage of staff PPE, and extensive cleaning of exam room while observing appropriate contact time as indicated for disinfecting solutions.  Subjective:     Patient ID: Mary [name] , female    DOB: 10/26/1945 , 74 y.o.   MRN: [id number]   Chief Complaint  Patient presents with  . Diabetes  . Hypertension    HPI  Diabetes She presents for her follow-up diabetic visit. She has type 2 diabetes mellitus. Her disease course has been stable. There are no hypoglycemic associated symptoms. Pertinent negatives for hypoglycemia include no headaches. Associated symptoms include fatigue. Pertinent negatives for diabetes include no blurred vision and no chest pain. There are no hypoglycemic complications. Diabetic complications include nephropathy. She is following a diabetic diet.  Hypertension This is a chronic problem. The current episode started more than 1 year ago. Pertinent negatives include no blurred vision, chest pain, headaches, orthopnea, palpitations or shortness of breath. Risk factors for coronary artery disease include diabetes mellitus, dyslipidemia, post-menopausal state and sedentary lifestyle.     Past Medical History:  Diagnosis Date  . Arthritis    osteoarthritis  . Cataract    OS  . Diabetes mellitus    Insulin dependent  . Diabetic retinopathy ([place])    NPDR OU  . Glaucoma    POAG OD  . H/O cardiovascular stress test    pt. reports 10/13- was normal per pt.   . Hyperlipemia   . Hypertension   . Hypertensive retinopathy    OU     Family History  Problem Relation Age of Onset  . Hypertension Mother   . Heart Problems Father   . Gout Father   . Arthritis Father      Current Outpatient Medications:  .  ACCU-CHEK AVIVA PLUS test strip, See admin instructions. for testing, Disp: , Rfl: 11 .  aspirin  EC 81 MG tablet, Take 81 mg by mouth daily., Disp: , Rfl:  .  Assure Comfort Lancets 28G MISC, , Disp: , Rfl:  .  Blood Glucose Monitoring Suppl (ACCU-CHEK AVIVA PLUS) w/Device KIT, Use as directed to check blood sugars 2 times per day dx: e11.65, Disp: 1 kit, Rfl: 1 .  cetirizine (ZYRTEC) 10 MG tablet, TAKE 1 TABLET BY MOUTH EVERY DAY, Disp: 90 tablet, Rfl: 1 .  dorzolamide-timolol (COSOPT) 22.3-6.8 MG/ML ophthalmic solution, Place 1 drop into the right eye 2 (two) times daily., Disp: , Rfl:  .  fluorometholone (FML) 0.1 % ophthalmic suspension, Place 1 drop into the right eye 3 (three) times daily., Disp: , Rfl:  .  fluticasone (FLONASE) 50 MCG/ACT nasal spray, SPRAY 1 SPRAY INTO EACH NOSTRIL EVERY DAY, Disp: 16 mL, Rfl: 1 .  furosemide (LASIX) 40 MG tablet, Take 1 tablet (40 mg total) by mouth daily., Disp: 90 tablet, Rfl: 1 .  HYDROcodone-acetaminophen (NORCO) 10-325 MG tablet, Take 1 tablet by mouth 5 (five) times daily as needed., Disp: , Rfl:  .  levothyroxine (SYNTHROID, LEVOTHROID) 112 MCG tablet, Take 112 mcg by mouth daily before breakfast. , Disp: , Rfl:  .  loratadine (CLARITIN) 10 MG tablet, Take 10 mg by mouth daily., Disp: , Rfl:  .  Magnesium Oxide -Mg Supplement 400 MG CAPS, TAKE 1 CAPSULE BY MOUTH EVERY DAY IN THE EVENING, Disp: 90 capsule, Rfl: 1 .  omeprazole (PRILOSEC) 20 MG capsule, TAKE 1 CAPSULE BY MOUTH  DAILY, Disp: 90 capsule, Rfl: 3 .  pravastatin (PRAVACHOL) 40 MG tablet, Take 1 tablet (40 mg total) by mouth daily., Disp: 90 tablet, Rfl: 1 .  telmisartan (MICARDIS) 80 MG tablet, TAKE 1 TABLET BY MOUTH  DAILY, Disp: 90 tablet, Rfl: 3 .  amoxicillin-clavulanate (AUGMENTIN) 500-125 MG tablet, Take 1 tablet (500 mg total) by mouth every 12 (twelve) hours., Disp: 14 tablet, Rfl: 0 .  KLOR-CON M20 20 MEQ tablet, Take 20 mEq by mouth daily. with food, Disp: , Rfl: 1 .  metoprolol tartrate (LOPRESSOR) 50 MG tablet, Take 50 mg by mouth 2 (two) times daily. , Disp: , Rfl:  .   OZEMPIC, 0.25 OR 0.5 MG/DOSE, 2 MG/1.5ML SOPN, INJECT SUBCUTANEOUSLY 0.5MG ONCE A WEEK. (Patient not taking: Reported on 04/06/2019), Disp: 4.5 mL, Rfl: 3 .  sulfamethoxazole-trimethoprim (BACTRIM) 400-80 MG tablet, Take 1 tablet by mouth 2 (two) times daily. (Patient not taking: Reported on 04/06/2019), Disp: 28 tablet, Rfl: 0   No Known Allergies   Review of Systems  Constitutional: Positive for fatigue.  HENT: Positive for sinus pressure and sinus pain.   Eyes: Negative for blurred vision.  Respiratory: Negative.  Negative for shortness of breath.   Cardiovascular: Negative.  Negative for chest pain, palpitations and orthopnea.  Gastrointestinal: Negative.   Neurological: Negative.  Negative for headaches.  Psychiatric/Behavioral: Negative.      Today's Vitals   04/06/19 0901  BP: (!) 146/88  Pulse: 83  Temp: 98.2 F (36.8 C)  TempSrc: Oral  Weight: 293 lb 12.8 oz (133.3 kg)  Height: 5' 7"  (1.702 m)  PainSc: 0-No pain   Body mass index is 46.02 kg/m.   Objective:  Physical Exam Vitals and nursing note reviewed.  Constitutional:      Appearance: Normal appearance. She is obese.  HENT:     Head: Normocephalic and atraumatic.     Comments: Frontal sinus tender to percussion    Right Ear: Tympanic membrane, ear canal and external ear normal.     Left Ear: Tympanic membrane, ear canal and external ear normal.  Cardiovascular:     Rate and Rhythm: Normal rate and regular rhythm.     Heart sounds: Normal heart sounds.  Pulmonary:     Effort: Pulmonary effort is normal.     Breath sounds: Normal breath sounds.  Skin:    General: Skin is warm.  Neurological:     General: No focal deficit present.     Mental Status: She is alert.  Psychiatric:        Mood and Affect: Mood normal.        Behavior: Behavior normal.         Assessment And Plan:     1. Diabetes mellitus with stage 3 chronic kidney disease (HCC)  Chronic, I will check labs as listed below. She is now  off of Ozempic due to diarrhea. She will try to manage with lifestyle changes.   - CMP14+EGFR - Hemoglobin A1c  2. Stage 3 chronic kidney disease, unspecified whether stage 3a or 3b CKD  Chronic, I will check renal function today. She is encouraged to stay well hydrated. Importance of optimal BP/BS control to avoid progression of CKD was discussed with the patient.   3. Parenchymal renal hypertension, stage 1 through stage 4 or unspecified chronic kidney disease  Chronic, fair control. She is encouraged to avoid adding salt to her foods. She will continue with current meds for now.   4. Acute non-recurrent maxillary sinusitis  She wants to be tested for COVID. She was also given Rocephin, 1gm x 1, Kenalog 50m IM x1,  and rx Augmentin, lower dose due to renal function. She is encouraged to complete full abx course.   - Novel Coronavirus, NAA ([facility]) - cefTRIAXone (ROCEPHIN) injection 1 g - triamcinolone acetonide (KENALOG-40) injection 40 mg  5. Class 3 severe obesity due to excess calories with serious comorbidity and body mass index (BMI) of 45.0 to 49.9 in adult (HCC)  BMI 46. She is encouraged to strive for BMI less than 40 to decrease cardiac risk. Importance of regular exercise was discussed with the patient. She is encouraged to perform chair exercises while watching TV.    RMaximino Greenland MD    THE PATIENT IS ENCOURAGED TO PRACTICE SOCIAL DISTANCING DUE TO THE COVID-19 PANDEMIC.

## 2019-04-13 ENCOUNTER — Ambulatory Visit: Payer: Medicare Other | Admitting: Sports Medicine

## 2019-04-15 ENCOUNTER — Other Ambulatory Visit: Payer: Self-pay

## 2019-04-23 NOTE — Progress Notes (Signed)
[facility] Note  04/30/2019     CHIEF COMPLAINT Patient presents for Retina Follow Up   HISTORY OF PRESENT ILLNESS: Mary [name] is a 74 y.o. female who presents to the clinic today for:   HPI    Retina Follow Up    Patient presents with  CRVO/BRVO.  In both eyes.  This started years ago.  Severity is moderate.  Duration of 6 weeks.  Since onset it is stable.  I, the attending physician,  performed the HPI with the patient and updated documentation appropriately.          Comments    74 y/o female pt here for 6 wk f/u for CRVO w/CME OU.  No change in [place] OU.  Denies pain, FOL, floaters.  Cosopt BID OD and FML TID OD.  BS 3 days ago was 85.  A1C 5.0.       Last edited by Bernarda Caffey, MD on 04/30/2019  9:33 AM. (History)    pt states her vision is okay, she is not seeing any floaters from the medication anymore   Referring physician: Glendale Chard, [address] [place],  [place] [address]  HISTORICAL INFORMATION:   Selected notes from the [facility]: Current Outpatient Medications (Ophthalmic Drugs)  Medication Sig  . dorzolamide-timolol (COSOPT) 22.3-6.8 MG/ML ophthalmic solution Place 1 drop into the right eye 2 (two) times daily.  . fluorometholone (FML) 0.1 % ophthalmic suspension Place 1 drop into the right eye 3 (three) times daily.   No current facility-administered medications for this visit. (Ophthalmic Drugs)   Current Outpatient Medications (Other)  Medication Sig  . ACCU-CHEK AVIVA PLUS test strip See admin instructions. for testing  . amoxicillin-clavulanate (AUGMENTIN) 500-125 MG tablet Take 1 tablet (500 mg total) by mouth every 12 (twelve) hours.  [name] aspirin EC 81 MG tablet Take 81 mg by mouth daily.  [name] 28G MISC   . Blood Glucose Monitoring Suppl (ACCU-CHEK AVIVA PLUS) w/Device KIT Use as directed to check blood sugars 2 times per day dx: e11.65  .  cetirizine (ZYRTEC) 10 MG tablet TAKE 1 TABLET BY MOUTH EVERY DAY  . fluticasone (FLONASE) 50 MCG/ACT nasal spray SPRAY 1 SPRAY INTO EACH NOSTRIL EVERY DAY  . furosemide (LASIX) 40 MG tablet Take 1 tablet (40 mg total) by mouth daily.  [name] HYDROcodone-acetaminophen (NORCO) 10-325 MG tablet Take 1 tablet by mouth 5 (five) times daily as needed.  [name] KLOR-CON M20 20 MEQ tablet Take 20 mEq by mouth daily. with food  . levothyroxine (SYNTHROID) 125 MCG tablet Take 125 mcg by mouth daily before breakfast.  . loratadine (CLARITIN) 10 MG tablet Take 10 mg by mouth daily.  . Magnesium Oxide -Mg Supplement 400 MG CAPS TAKE 1 CAPSULE BY MOUTH EVERY DAY IN THE EVENING  . metoprolol tartrate (LOPRESSOR) 50 MG tablet Take 50 mg by mouth 2 (two) times daily.   [name] omeprazole (PRILOSEC) 20 MG capsule TAKE 1 CAPSULE BY MOUTH  DAILY  . OZEMPIC, 0.25 OR 0.5 MG/DOSE, 2 MG/1.5ML SOPN INJECT SUBCUTANEOUSLY 0.5MG ONCE A WEEK. (Patient not taking: Reported on 04/06/2019)  . pravastatin (PRAVACHOL) 40 MG tablet Take 1 tablet (40 mg total) by mouth daily.  [name] sulfamethoxazole-trimethoprim (BACTRIM) 400-80 MG tablet Take 1 tablet by mouth 2 (two) times daily. (Patient not taking: Reported on 04/06/2019)  . telmisartan (MICARDIS) 80 MG tablet TAKE 1 TABLET BY MOUTH  DAILY   No current facility-administered medications for this visit. (Other)      REVIEW OF SYSTEMS: ROS    Positive for: Musculoskeletal, Endocrine, Eyes   Negative for: Constitutional, Gastrointestinal, Neurological, Skin, Genitourinary, HENT, Cardiovascular, Respiratory, Psychiatric, Allergic/Imm, Heme/Lymph   Last edited by Matthew Folks, COA on 04/30/2019  9:22 AM. (History)       ALLERGIES No Known Allergies  PAST MEDICAL HISTORY Past Medical History:  Diagnosis Date  . Arthritis    osteoarthritis  . Cataract    OS  . Diabetes mellitus    Insulin dependent  . Diabetic retinopathy ([place])    NPDR OU  . Glaucoma    POAG OD  . H/O  cardiovascular stress test    pt. reports 10/13- was normal per pt.   . Hyperlipemia   . Hypertension   . Hypertensive retinopathy    OU   Past Surgical History:  Procedure Laterality Date  . CARPAL TUNNEL RELEASE Bilateral   . CATARACT EXTRACTION Right 10/05/2018   Dr. Kathlen Mody  . EYE SURGERY Right    Cat Sx  . KNEE ARTHROSCOPY     right  . QUADRICEPS TENDON REPAIR  02/17/2012   Procedure: REPAIR QUADRICEP TENDON;  Surgeon: Vickey Huger, MD;  Location: [facility];  Service: Orthopedics;  Laterality: Right;  right quadriceps repair with latera release  . QUADRICEPS TENDON REPAIR Right 05/15/2012   Procedure: REPAIR QUADRICEP TENDON;  Surgeon: Vickey Huger, MD;  Location: [facility];  Service: Orthopedics;  Laterality: Right;  . TOTAL KNEE ARTHROPLASTY  10/21/2011   rt tk  . TOTAL KNEE ARTHROPLASTY  10/21/2011   Procedure: TOTAL KNEE ARTHROPLASTY;  Surgeon: Rudean Haskell, MD;  Location: [facility];  Service: Orthopedics;  Laterality: Right;  . TUBAL LIGATION      FAMILY HISTORY Family History  Problem Relation Age of Onset  . Hypertension Mother   . Heart Problems Father   . Gout Father   . Arthritis Father     SOCIAL HISTORY Social History   Tobacco Use  . Smoking status: Former Smoker    Packs/day: 0.25    Years: 20.00    Pack years: 5.00    Types: Cigarettes    Quit date: 06/28/2014    Years since quitting: 4.8  . Smokeless tobacco: Never Used  Substance Use Topics  . Alcohol use: No  . Drug use: Yes    Types: Oxycodone    Comment: Uses as needed for pain         OPHTHALMIC EXAM:  Base Eye Exam    Visual Acuity (Snellen - Linear)      Right Left   Dist cc 20/80 -2 HM   Dist ph cc 20/80 +2 NI   Correction: Glasses       Tonometry (Tonopen, 9:24 AM)      Right Left   Pressure 15 15       Pupils      Dark Light Shape React APD   Right 2 1 Round Minimal None   Left 2 1 Round Minimal None       Visual Fields (Counting fingers)      Left Right     Full    Restrictions Total superior temporal, inferior temporal, superior nasal, inferior nasal deficiencies        Extraocular Movement      Right Left    Full Full       Neuro/Psych    Oriented x3: Yes  Mood/Affect: Normal       Dilation    Both eyes: 1.0% Mydriacyl, 2.5% Phenylephrine @ 9:24 AM        Slit Lamp and Fundus Exam    Slit Lamp Exam      Right Left   Lids/Lashes Dermatochalasis - upper lid, mild Meibomian gland dysfunction Dermatochalasis - upper lid, mild Meibomian gland dysfunction   Conjunctiva/Sclera Melanosis Melanosis   Cornea 2-3+ Punctate epithelial erosions, well healed temporal cataract wounds 3+ Punctate epithelial erosions, well healed temporal cataract wounds   Anterior Chamber Deep and quiet Deep and quiet   Iris Round and moderately dilated to 5.101m Round and moderately dilated to 674m  Lens PC IOL in good position, trace, cortical remnant temporally, PC folds 2-3+ Nuclear sclerosis, 2-3+ Cortical cataract   Vitreous Vitreous syneresis, vitreous condensations, Ozurdex pellet remnants inferiorly, no residual IVTA Vitreous syneresis       Fundus Exam      Right Left   Disc  2-3+Pallor, Sharp rim Mild Pallor, Sharp rim, PPP   C/D Ratio 0.5 0.3   Macula Blunted foveal reflex, Drusen, Retinal pigment epithelial mottling, diffuse edema -- slightly improved, CWS inferiorly Flat, Blunted foveal reflex, RPE mottling and clumping, scarring, atrophy, no heme   Vessels Vascular attenuation, Tortuous Severe Vascular attenuation, +fibrosis   Periphery Attached    Attached, pavingstone and reticular degeneration inferiorly, scattered RPE atrophy          IMAGING AND PROCEDURES  Imaging and Procedures for _0 @  OCT, Retina - OU - Both Eyes       Right Eye Quality was good. Central Foveal Thickness: 345. Progression has improved. Findings include abnormal foveal contour, intraretinal fluid, intraretinal hyper-reflective material, no SRF, vitreomacular  adhesion  (Mild Interval improvement in IRF/CME).   Left Eye Quality was good. Central Foveal Thickness: 214. Progression has been stable. Findings include abnormal foveal contour, no IRF, no SRF, outer retinal atrophy, epiretinal membrane, preretinal fibrosis, subretinal hyper-reflective material, pigment epithelial detachment (Stable from prior).   Notes *Images captured and stored on drive  Diagnosis / Impression:  OD: CRVO w/ mild interval improvement in CME OS: diffuse ORA - Stable from prior  Clinical management:  See below  Abbreviations: NFP - Normal foveal profile. CME - cystoid macular edema. PED - pigment epithelial detachment. IRF - intraretinal fluid. SRF - subretinal fluid. EZ - ellipsoid zone. ERM - epiretinal membrane. ORA - outer retinal atrophy. ORT - outer retinal tubulation. SRHM - subretinal hyper-reflective material        Intravitreal Injection, Pharmacologic Agent - OD - Right Eye       Time Out 04/30/2019. 11:06 AM. Confirmed correct patient, procedure, site, and patient consented.   Anesthesia Anesthetic medications included Lidocaine 2%, Proparacaine 0.5%.   Procedure Preparation included 5% betadine to ocular surface, eyelid speculum. A 27 gauge needle was used.   Injection:  4 mg Triescence 4059mlL injection   NDC: 006346-612-2838ot: 108LM, Expiration date: 07/13/2020   Route: Intravitreal, Site: Right Eye, Waste: 0.9 mL  Post-op Post injection exam found visual acuity of at least counting fingers. The patient tolerated the procedure well. There were no complications. The patient received written and verbal post procedure care education.   Notes An AC tap was performed following injection due to elevated IOP using a 30 gauge needle on a syringe with the plunger removed. The needle was placed at the limbus at 7 oclock and approximately 0.07 cc of aqueous was removed from the anterior  chamber. Betadine was applied to the tap area before and after  the paracentesis was performed. There were no complications. The patient tolerated the procedure well. The IOP was rechecked and was found to be ~10 mmHg by palpation.                 ASSESSMENT/PLAN:    ICD-10-CM   1. Central retinal vein occlusion with macular edema of both eyes  H34.8130 Intravitreal Injection, Pharmacologic Agent - OD - Right Eye    triamcinolone acetonide (TRIESENCE) 40 MG/ML subtenons injection 4 mg  2. Moderate nonproliferative diabetic retinopathy of both eyes with macular edema associated with type 2 diabetes mellitus ([place])  L89.3734   3. Retinal edema  H35.81 OCT, Retina - OU - Both Eyes  4. Essential hypertension  I10   5. Hypertensive retinopathy of both eyes  H35.033   6. Combined forms of age-related cataract of left eye  H25.812   7. Pseudophakia  Z96.1   8. Primary open angle glaucoma (POAG) of right eye, mild stage  H40.1111     1. CRVO w/ CME OU  - pt of Dr. Zigmund Daniel who wished to transfer care  - OS long-standing low vision (HM) with subfoveal scar and central retinal atrophy  - OD with CME actively being managed with Ozurdex OD ~q5 wks by Dr. Zigmund Daniel (last on 8.20.20) s/p multiple IVTA and Ozurdex OD  - was due for Ozurdex OD w/ Zigmund Daniel on 9.25.20, but underwent cataract surgery OD w/ Kathlen Mody on 9.21.20  - s/p IVTA OD #4 (10.26.20), #5 (12.10.20), #6 (01.21.21), #7 (03.05.21)  - today BCVA OD 20/80 (stable)  - exam shows no residual IVTA in vitreous cavity  - OCT shows diffuse macular edema slightly improved from prior -- likely multifactorial -- CRVO, DME, post-cataract CME  - recommend IVTA OD #8 today, 04.16.21  - pt wishes to proceed  - RBA of procedure discussed, questions answered  - informed consent obtained and signed  - see procedure note -- AC tap  - f/u in 6 wks -- DFE/OCT/possible injection  2,3. Severe Non-proliferative diabetic retinopathy OU  - The incidence, risk factors for progression, natural history and treatment  options for diabetic retinopathy  were discussed with patient.    - The need for close monitoring of blood glucose, blood pressure, and serum lipids, avoiding cigarette or any type of tobacco, and the need for long term follow up was also discussed with patient.  - OCT shows diabetic macular edema, OD   - recommend IVTA OD today, 3.5.21 as above for CRVO  4,5. Hypertensive retinopathy OU  - discussed importance of tight BP control  - monitor  6. Mixed form age related cataract OS  - under the expert management of Dr. Kathlen Mody  7. Pseudophakia OD  - s/p CE/IOL OD (9.21.20) by expert surgeon, Dr. Kathlen Mody  - IOL in excellent position  - had post op IOP spike -- IOP now under better control  - monitor  8. POAG OD  - under the expert management of Dr. Kathlen Mody  - s/p SLT and goniotomy OD  - had post-cataract IOP spike  - IOP 15 today  Ophthalmic Meds Ordered this visit:  Meds ordered this encounter  Medications  . triamcinolone acetonide (TRIESENCE) 40 MG/ML subtenons injection 4 mg       Return in about 6 weeks (around 06/11/2019) for f/u CRVO OU, DFE, OCT.  There are no Patient Instructions on file for this visit.   Explained the  diagnoses, plan, and follow up with the patient and they expressed understanding.  Patient expressed understanding of the importance of proper follow up care.   This document serves as a record of services personally performed by Gardiner Sleeper, MD, PhD. It was created on their behalf by Ernest Mallick, OA, an ophthalmic assistant. The creation of this record is the provider's dictation and/or activities during the visit.    Electronically signed by: Ernest Mallick, OA 04.09.2021 1:06 PM   Gardiner Sleeper, M.D., Ph.D. Diseases & Surgery of the Retina and Vitreous [facility]  I have reviewed the above documentation for accuracy and completeness, and I agree with the above. Gardiner Sleeper, M.D., Ph.D. 04/30/19 1:07  PM    Abbreviations: M myopia (nearsighted); A astigmatism; H hyperopia (farsighted); P presbyopia; Mrx spectacle prescription;  CTL contact lenses; OD right eye; OS left eye; OU both eyes  XT exotropia; ET esotropia; PEK punctate epithelial keratitis; PEE punctate epithelial erosions; DES dry eye syndrome; MGD meibomian gland dysfunction; ATs artificial tears; PFAT's preservative free artificial tears; Art[place]us nuclear sclerotic cataract; PSC posterior subcapsular cataract; ERM epi-retinal membrane; PVD posterior vitreous detachment; RD retinal detachment; DM diabetes mellitus; DR diabetic retinopathy; NPDR non-proliferative diabetic retinopathy; PDR proliferative diabetic retinopathy; CSME clinically significant macular edema; DME diabetic macular edema; dbh dot blot hemorrhages; CWS cotton wool spot; POAG primary open angle glaucoma; C/D cup-to-disc ratio; HVF humphrey visual field; GVF goldmann visual field; OCT optical coherence tomography; IOP intraocular pressure; BRVO Branch retinal vein occlusion; CRVO central retinal vein occlusion; CRAO central retinal artery occlusion; BRAO branch retinal artery occlusion; RT retinal tear; SB scleral buckle; PPV pars plana vitrectomy; VH Vitreous hemorrhage; PRP panretinal laser photocoagulation; IVK intravitreal kenalog; VMT vitreomacular traction; MH Macular hole;  NVD neovascularization of the disc; NVE neovascularization elsewhere; AREDS age related eye disease study; ARMD age related macular degeneration; POAG primary open angle glaucoma; EBMD epithelial/anterior basement membrane dystrophy; ACIOL anterior chamber intraocular lens; IOL intraocular lens; PCIOL posterior chamber intraocular lens; Phaco/IOL phacoemulsification with intraocular lens placement; [place] photorefractive keratectomy; LASIK laser assisted in situ keratomileusis; HTN hypertension; DM diabetes mellitus; COPD chronic obstructive pulmonary disease

## 2019-04-30 ENCOUNTER — Encounter (INDEPENDENT_AMBULATORY_CARE_PROVIDER_SITE_OTHER): Payer: Self-pay | Admitting: Ophthalmology

## 2019-04-30 ENCOUNTER — Ambulatory Visit (INDEPENDENT_AMBULATORY_CARE_PROVIDER_SITE_OTHER): Payer: Medicare Other | Admitting: Ophthalmology

## 2019-04-30 DIAGNOSIS — H401111 Primary open-angle glaucoma, right eye, mild stage: Secondary | ICD-10-CM

## 2019-04-30 DIAGNOSIS — I10 Essential (primary) hypertension: Secondary | ICD-10-CM

## 2019-04-30 DIAGNOSIS — H3581 Retinal edema: Secondary | ICD-10-CM

## 2019-04-30 DIAGNOSIS — H35033 Hypertensive retinopathy, bilateral: Secondary | ICD-10-CM

## 2019-04-30 DIAGNOSIS — E113313 Type 2 diabetes mellitus with moderate nonproliferative diabetic retinopathy with macular edema, bilateral: Secondary | ICD-10-CM | POA: Diagnosis not present

## 2019-04-30 DIAGNOSIS — H34813 Central retinal vein occlusion, bilateral, with macular edema: Secondary | ICD-10-CM

## 2019-04-30 DIAGNOSIS — H25812 Combined forms of age-related cataract, left eye: Secondary | ICD-10-CM

## 2019-04-30 DIAGNOSIS — Z961 Presence of intraocular lens: Secondary | ICD-10-CM

## 2019-04-30 MED ORDER — TRIAMCINOLONE ACETONIDE 40 MG/ML IO SUSP
4.0000 mg | INTRAOCULAR | Status: AC | PRN
  Administered 2019-04-30: 4 mg via INTRAVITREAL

## 2019-05-11 ENCOUNTER — Other Ambulatory Visit: Payer: Self-pay

## 2019-05-11 ENCOUNTER — Ambulatory Visit (INDEPENDENT_AMBULATORY_CARE_PROVIDER_SITE_OTHER): Payer: Medicare Other | Admitting: Sports Medicine

## 2019-05-11 DIAGNOSIS — M79674 Pain in right toe(s): Secondary | ICD-10-CM

## 2019-05-11 DIAGNOSIS — B351 Tinea unguium: Secondary | ICD-10-CM

## 2019-05-11 DIAGNOSIS — L84 Corns and callosities: Secondary | ICD-10-CM | POA: Diagnosis not present

## 2019-05-11 DIAGNOSIS — I739 Peripheral vascular disease, unspecified: Secondary | ICD-10-CM

## 2019-05-11 DIAGNOSIS — M79675 Pain in left toe(s): Secondary | ICD-10-CM | POA: Diagnosis not present

## 2019-05-11 DIAGNOSIS — E1142 Type 2 diabetes mellitus with diabetic polyneuropathy: Secondary | ICD-10-CM | POA: Diagnosis not present

## 2019-05-11 NOTE — Progress Notes (Signed)
Subjective: Mary [name] is a 74 y.o. female patient seen in office for evaluation of ulceration of the Right foot. Patient reports that the area on right foot remains dry and has not had any problems, no drainage or bleeding from area. Denies nausea/fever/vomiting/chills/night sweats/shortness of breath/pain. Patient also requests nail trim. Reports that she has some swelling, taking lasix and wearing stockings. Patient has no other pedal complaints at this time.  FBS not recorded today and A1c less than 5.   Patient Active Problem List   Diagnosis Date Noted  . Diabetes mellitus with stage 3 chronic kidney disease ([place]) 02/21/2018  . 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 Primary osteoarthritis of both knees 02/21/2018  . Class 3 severe obesity due to excess calories with serious comorbidity and body mass index (BMI) of 45.0 to 49.9 in adult ([place]) 02/21/2018  . Skin ulcer of right foot with fat layer exposed ([place]) 02/18/2018  . Morbid (severe) obesity due to excess calories ([place]) 11/18/2017  . Knee pain 07/16/2015  . Lateral dislocation of right patella 05/04/2015  . Rupture of right quadriceps tendon 05/24/2014  . S/P TKR (total knee replacement) 05/05/2012   Current Outpatient Medications on File Prior to Visit  Medication Sig Dispense Refill  . ACCU-CHEK AVIVA PLUS test strip See admin instructions. for testing  11  . amoxicillin-clavulanate (AUGMENTIN) 500-125 MG tablet Take 1 tablet (500 mg total) by mouth every 12 (twelve) hours. 14 tablet 0  . aspirin EC 81 MG tablet Take 81 mg by mouth daily.    [name] 28G MISC     . Blood Glucose Monitoring Suppl (ACCU-CHEK AVIVA PLUS) w/Device KIT Use as directed to check blood sugars 2 times per day dx: e11.65 1 kit 1  . cetirizine (ZYRTEC) 10 MG tablet TAKE 1 TABLET BY MOUTH EVERY DAY 90 tablet 1  . dorzolamide-timolol (COSOPT) 22.3-6.8 MG/ML ophthalmic solution Place 1 drop into the right eye 2 (two) times daily.     . fluorometholone (FML) 0.1 % ophthalmic suspension Place 1 drop into the right eye 3 (three) times daily.    . fluticasone (FLONASE) 50 MCG/ACT nasal spray SPRAY 1 SPRAY INTO EACH NOSTRIL EVERY DAY 16 mL 1  . furosemide (LASIX) 40 MG tablet Take 1 tablet (40 mg total) by mouth daily. 90 tablet 1  . HYDROcodone-acetaminophen (NORCO) 10-325 MG tablet Take 1 tablet by mouth 5 (five) times daily as needed.    [name] KLOR-CON M20 20 MEQ tablet Take 20 mEq by mouth daily. with food  1  . levothyroxine (SYNTHROID) 125 MCG tablet Take 125 mcg by mouth daily before breakfast.    . loratadine (CLARITIN) 10 MG tablet Take 10 mg by mouth daily.    . Magnesium Oxide -Mg Supplement 400 MG CAPS TAKE 1 CAPSULE BY MOUTH EVERY DAY IN THE EVENING 90 capsule 1  . metoprolol tartrate (LOPRESSOR) 50 MG tablet Take 50 mg by mouth 2 (two) times daily.     [name] omeprazole (PRILOSEC) 20 MG capsule TAKE 1 CAPSULE BY MOUTH  DAILY 90 capsule 3  . OZEMPIC, 0.25 OR 0.5 MG/DOSE, 2 MG/1.5ML SOPN INJECT SUBCUTANEOUSLY 0.5MG ONCE A WEEK. 4.5 mL 3  . pravastatin (PRAVACHOL) 40 MG tablet Take 1 tablet (40 mg total) by mouth daily. 90 tablet 1  . sulfamethoxazole-trimethoprim (BACTRIM) 400-80 MG tablet Take 1 tablet by mouth 2 (two) times daily. 28 tablet 0  . telmisartan (MICARDIS) 80 MG tablet TAKE 1 TABLET BY MOUTH  DAILY 90  tablet 3   No current facility-administered medications on file prior to visit.   No Known Allergies  Recent Results (from the past 2160 hour(s))  WOUND CULTURE     Status: Abnormal   Collection Time: 02/16/19 10:45 AM   Specimen: Abscess; Wound  Result Value Ref Range   MICRO NUMBER: [id number]    SPECIMEN QUALITY: Adequate    SOURCE: WOUND (SITE NOT SPECIFIED)    STATUS: FINAL    GRAM STAIN:      No white blood cells seen Few epithelial cells Many Gram positive cocci in pairs Moderate Gram positive bacilli Moderate Gram negative bacilli   ISOLATE 1: Staphylococcus aureus (A)     Comment: Staphylococcus  aureus      Susceptibility   Staphylococcus aureus - AEROBIC CULT, GRAM STAIN POSITIVE 1    VANCOMYCIN 1 Sensitive     CIPROFLOXACIN <=0.5 Sensitive     CLINDAMYCIN <=0.25 Sensitive     LEVOFLOXACIN 0.25 Sensitive     ERYTHROMYCIN <=0.25 Sensitive     GENTAMICIN <=0.5 Sensitive     OXACILLIN* <=0.25 Sensitive      * Oxacillin-susceptible staphylococci aresusceptible to other penicillinase-stablepenicillins (e.g. Methicillin, Nafcillin), beta-lactam/beta-lactamase inhibitor combinations, andcephems with staphylococcal indications, includingCefazolin.    TETRACYCLINE <=1 Sensitive     TRIMETH/SULFA* <=10 Sensitive      * Oxacillin-susceptible staphylococci aresusceptible to other penicillinase-stablepenicillins (e.g. Methicillin, Nafcillin), beta-lactam/beta-lactamase inhibitor combinations, andcephems with staphylococcal indications, includingCefazolin.Legend:S = Susceptible  I = IntermediateR = Resistant  NS = Not susceptible* = Not tested  NR = Not reported**NN = See antimicrobic comments  CMP14+EGFR     Status: Abnormal   Collection Time: 04/06/19 11:14 AM  Result Value Ref Range   Glucose 103 (H) 65 - 99 mg/dL   BUN 25 8 - 27 mg/dL   Creatinine, Ser 1.33 (H) 0.57 - 1.00 mg/dL   GFR calc non Af Amer 40 (L) >59 mL/min/1.73   GFR calc Af Amer 46 (L) >59 mL/min/1.73   BUN/Creatinine Ratio 19 12 - 28   Sodium 142 134 - 144 mmol/L   Potassium 4.5 3.5 - 5.2 mmol/L   Chloride 105 96 - 106 mmol/L   CO2 24 20 - 29 mmol/L   Calcium 9.8 8.7 - 10.3 mg/dL   Total Protein 8.0 6.0 - 8.5 g/dL   Albumin 4.4 3.7 - 4.7 g/dL   Globulin, Total 3.6 1.5 - 4.5 g/dL   Albumin/Globulin Ratio 1.2 1.2 - 2.2   Bilirubin Total 0.6 0.0 - 1.2 mg/dL   Alkaline Phosphatase 109 39 - 117 IU/L   AST 14 0 - 40 IU/L   ALT 11 0 - 32 IU/L  Hemoglobin A1c     Status: None   Collection Time: 04/06/19 11:14 AM  Result Value Ref Range   Hgb A1c MFr Bld 5.0 4.8 - 5.6 %    Comment:          Prediabetes: 5.7 - 6.4           Diabetes: >6.4          Glycemic control for adults with diabetes: <7.0    Est. average glucose Bld gHb Est-mCnc 97 mg/dL  Novel Coronavirus, NAA ([facility])     Status: None   Collection Time: 04/06/19  4:38 PM   Specimen: Nasopharyngeal(NP) swabs in vial transport medium  Result Value Ref Range   SARS-CoV-2, NAA Not Detected Not Detected    Comment: This nucleic acid amplification test was developed and its performance characteristics  determined by [employer]. Nucleic acid amplification tests include RT-PCR and TMA. This test has not been FDA cleared or approved. This test has been authorized by FDA under an Emergency Use Authorization (EUA). This test is only authorized for the duration of time the declaration that circumstances exist justifying the authorization of the emergency use of in vitro diagnostic tests for detection of SARS-CoV-2 virus and/or diagnosis of COVID-19 infection under section 564(b)(1) of the Act, 21 U.S.C. 341PFX-9(K) (1), unless the authorization is terminated or revoked sooner. When diagnostic testing is negative, the possibility of a false negative result should be considered in the context of a patient's recent exposures and the presence of clinical signs and symptoms consistent with COVID-19. An individual without symptoms of COVID-19 and who is not shedding SARS-CoV-2 virus wo uld expect to have a negative (not detected) result in this assay.   SARS-COV-2, NAA 2 DAY TAT     Status: None   Collection Time: 04/06/19  4:38 PM  Result Value Ref Range   SARS-CoV-2, NAA 2 DAY TAT Performed     Objective: There were no vitals filed for this visit.  General: Patient is awake, alert, oriented x 3 and in no acute distress.  Dermatology: Skin is warm and dry bilateral with a callus sub met 1 on right, no opening once keratotic skin was removed, There is no malodor, no active drainage, no erythema, no edema. No acute signs of infection.  Callus  sub met 1 on left and Bilateral hallux and sub met 5 on left with no signs of infection  Nails x 10 mildly elongated and thickened consistent with oncyhomycosis   Vascular: Dorsalis Pedis pulse = 1/4 Bilateral,  Posterior Tibial pulse = 0/4 Bilateral,  Capillary Fill Time < 5 seconds, Lymphedema bilateral  Neurologic: Protective absent bilateral  Musculosketal: No Pain with palpation to healed ulcer/callus on right. No pain with compression to calves bilateral. + Bunion, hammertoe, pes planus bilateral.  No results for input(s): GRAMSTAIN, LABOR[facility] in the last 8760 hours.  Assessment and Plan:  Problem List Items Addressed This Visit    None    Visit Diagnoses    Pre-ulcerative calluses    -  Primary   Pain due to onychomycosis of toenails of both feet       PVD (peripheral vascular disease) ([place])       Diabetic polyneuropathy associated with type 2 diabetes mellitus ([place])          -Examined patient and discussed the progression of the healed wound and treatment alternatives. -Mechanically dedbrided callus bilateral using a sterile chisel blade, no opening to right foot. Betadine applied to protect skin. No dressing needed -Applied dancer offloading pads sub met 1 bilateral -Mechanically debrided nails using sterile nail nipper without incident  -Advised patient to continue to wear her shoes to keep pressure off callus/preulcerative areas -Advised patient to wear compression stockings and to take Lasix for edema control like previous and advised patient to look into getting zippered or velcro style -Patient to return to office in 8 weeks for nail trim and wound/callus check or sooner if problems arise.  Landis Martins, DPM

## 2019-05-25 ENCOUNTER — Other Ambulatory Visit: Payer: Self-pay | Admitting: Internal Medicine

## 2019-06-08 NOTE — Progress Notes (Signed)
[facility] Note  06/11/2019     CHIEF COMPLAINT Patient presents for Retina Follow Up   HISTORY OF PRESENT ILLNESS: Mary [name] is a 74 y.o. female who presents to the clinic today for:   HPI    Retina Follow Up    Patient presents with  CRVO/BRVO.  In both eyes.  This started months ago.  Severity is moderate.  Duration of 6 weeks.  Since onset it is stable.  I, the attending physician,  performed the HPI with the patient and updated documentation appropriately.          Comments    74 y/o female pt here for 6 wk f/u for CRVO w/mac edema OU.  No change in [place] OU.  Denies pain, FOL, floaters.  Cosopt BID OD.       Last edited by Bernarda Caffey, MD on 06/11/2019 10:35 AM. (History)    pt states her vision is about the same   Referring physician: Glendale Chard, [address] [place],  [place] [address]  HISTORICAL INFORMATION:   Selected notes from the [place] Transferred care from Dr. Zigmund Daniel   CURRENT MEDICATIONS: Current Outpatient Medications (Ophthalmic Drugs)  Medication Sig  . dorzolamide-timolol (COSOPT) 22.3-6.8 MG/ML ophthalmic solution Place 1 drop into the right eye 2 (two) times daily.  . fluorometholone (FML) 0.1 % ophthalmic suspension Place 1 drop into the right eye 3 (three) times daily.   No current facility-administered medications for this visit. (Ophthalmic Drugs)   Current Outpatient Medications (Other)  Medication Sig  . ACCU-CHEK AVIVA PLUS test strip See admin instructions. for testing  . amoxicillin-clavulanate (AUGMENTIN) 500-125 MG tablet Take 1 tablet (500 mg total) by mouth every 12 (twelve) hours.  [name] aspirin EC 81 MG tablet Take 81 mg by mouth daily.  [name] 28G MISC   . Blood Glucose Monitoring Suppl (ACCU-CHEK AVIVA PLUS) w/Device KIT Use as directed to check blood sugars 2 times per day dx: e11.65  . cetirizine (ZYRTEC) 10 MG tablet TAKE 1 TABLET BY MOUTH  EVERY DAY  . fluticasone (FLONASE) 50 MCG/ACT nasal spray SPRAY 1 SPRAY INTO EACH NOSTRIL EVERY DAY  . furosemide (LASIX) 40 MG tablet Take 1 tablet (40 mg total) by mouth daily.  [name] HYDROcodone-acetaminophen (NORCO) 10-325 MG tablet Take 1 tablet by mouth 5 (five) times daily as needed.  [name] KLOR-CON M20 20 MEQ tablet Take 20 mEq by mouth daily. with food  . levothyroxine (SYNTHROID) 125 MCG tablet Take 125 mcg by mouth daily before breakfast.  . loratadine (CLARITIN) 10 MG tablet Take 10 mg by mouth daily.  . Magnesium Oxide -Mg Supplement 400 MG CAPS TAKE 1 CAPSULE BY MOUTH EVERY DAY IN THE EVENING  . metoprolol tartrate (LOPRESSOR) 50 MG tablet Take 50 mg by mouth 2 (two) times daily.   [name] omeprazole (PRILOSEC) 20 MG capsule TAKE 1 CAPSULE BY MOUTH  DAILY  . OZEMPIC, 0.25 OR 0.5 MG/DOSE, 2 MG/1.5ML SOPN INJECT SUBCUTANEOUSLY 0.5MG ONCE A WEEK.  . pravastatin (PRAVACHOL) 40 MG tablet Take 1 tablet (40 mg total) by mouth daily.  . predniSONE (DELTASONE) 10 MG tablet 4 qd x 1 d then 3 qd x 1 d then 2 qd x 1 d then 1 qd x 1 d  . sulfamethoxazole-trimethoprim (BACTRIM) 400-80 MG tablet Take 1 tablet by mouth 2 (two) times daily.  [name] telmisartan (MICARDIS) 80 MG tablet TAKE 1 TABLET BY MOUTH  DAILY   No current facility-administered medications for this visit. (Other)      REVIEW OF SYSTEMS: ROS    Positive for: Genitourinary, Musculoskeletal, Endocrine, Eyes   Negative for: Constitutional, Gastrointestinal, Neurological, Skin, HENT, Cardiovascular, Respiratory, Psychiatric, Allergic/Imm, Heme/Lymph   Last edited by Matthew Folks, COA on 06/11/2019  9:00 AM. (History)       ALLERGIES No Known Allergies  PAST MEDICAL HISTORY Past Medical History:  Diagnosis Date  . Arthritis    osteoarthritis  . Cataract    OS  . Diabetes mellitus    Insulin dependent  . Diabetic retinopathy ([place])    NPDR OU  . Glaucoma    POAG OD  . H/O cardiovascular stress test    pt. reports 10/13- was  normal per pt.   . Hyperlipemia   . Hypertension   . Hypertensive retinopathy    OU   Past Surgical History:  Procedure Laterality Date  . CARPAL TUNNEL RELEASE Bilateral   . CATARACT EXTRACTION Right 10/05/2018   Dr. Kathlen Mody  . EYE SURGERY Right    Cat Sx  . KNEE ARTHROSCOPY     right  . QUADRICEPS TENDON REPAIR  02/17/2012   Procedure: REPAIR QUADRICEP TENDON;  Surgeon: Vickey Huger, MD;  Location: [facility];  Service: Orthopedics;  Laterality: Right;  right quadriceps repair with latera release  . QUADRICEPS TENDON REPAIR Right 05/15/2012   Procedure: REPAIR QUADRICEP TENDON;  Surgeon: Vickey Huger, MD;  Location: [facility];  Service: Orthopedics;  Laterality: Right;  . TOTAL KNEE ARTHROPLASTY  10/21/2011   rt tk  . TOTAL KNEE ARTHROPLASTY  10/21/2011   Procedure: TOTAL KNEE ARTHROPLASTY;  Surgeon: Rudean Haskell, MD;  Location: [facility];  Service: Orthopedics;  Laterality: Right;  . TUBAL LIGATION      FAMILY HISTORY Family History  Problem Relation Age of Onset  . Hypertension Mother   . Heart Problems Father   . Gout Father   . Arthritis Father     SOCIAL HISTORY Social History   Tobacco Use  . Smoking status: Former Smoker    Packs/day: 0.25    Years: 20.00    Pack years: 5.00    Types: Cigarettes    Quit date: 06/28/2014    Years since quitting: 4.9  . Smokeless tobacco: Never Used  Substance Use Topics  . Alcohol use: No  . Drug use: Yes    Types: Oxycodone    Comment: Uses as needed for pain         OPHTHALMIC EXAM:  Base Eye Exam    Visual Acuity (Snellen - Linear)      Right Left   Dist cc 20/80 -2 HM   Dist ph cc NI NI   Correction: Glasses       Tonometry (Tonopen, 9:02 AM)      Right Left   Pressure 12 15       Pupils      Dark Light Shape React APD   Right 2 1 Round Minimal None   Left 2 1 Round Minimal None       Visual Fields (Counting fingers)      Left Right     Full   Restrictions Total superior temporal, inferior temporal, superior  nasal, inferior nasal deficiencies        Extraocular Movement      Right Left    Full, Ortho Full, Ortho       Neuro/Psych    Oriented x3: Yes  Mood/Affect: Normal       Dilation    Both eyes: 1.0% Mydriacyl, 2.5% Phenylephrine @ 9:03 AM        Slit Lamp and Fundus Exam    Slit Lamp Exam      Right Left   Lids/Lashes Dermatochalasis - upper lid, mild Meibomian gland dysfunction Dermatochalasis - upper lid, mild Meibomian gland dysfunction   Conjunctiva/Sclera Melanosis Melanosis   Cornea 2-3+ Punctate epithelial erosions, well healed temporal cataract wounds 3+ Punctate epithelial erosions, well healed temporal cataract wounds   Anterior Chamber Deep and quiet Deep and quiet   Iris Round and moderately dilated to 5.88m Round and moderately dilated to 628m  Lens PC IOL in good position, trace, cortical remnant temporally, PC folds 2-3+ Nuclear sclerosis, 2-3+ Cortical cataract   Vitreous Vitreous syneresis, vitreous condensations, Ozurdex pellet remnants inferiorly, no residual IVTA Vitreous syneresis       Fundus Exam      Right Left   Disc  2-3+Pallor, Sharp rim, temporal PPA Mild Pallor, Sharp rim, PPP   C/D Ratio 0.5 0.3   Macula Blunted foveal reflex, Drusen, Retinal pigment epithelial mottling, diffuse edema -- slightly improved Flat, Blunted foveal reflex, RPE mottling and clumping, scarring, atrophy, no heme   Vessels Vascular attenuation, Tortuous Severe Vascular attenuation, +fibrosis   Periphery Attached    Attached, pavingstone and reticular degeneration inferiorly, scattered RPE atrophy          IMAGING AND PROCEDURES  Imaging and Procedures for _0 @  OCT, Retina - OU - Both Eyes       Right Eye Quality was good. Central Foveal Thickness: 335. Progression has been stable. Findings include abnormal foveal contour, intraretinal fluid, intraretinal hyper-reflective material, no SRF, vitreomacular adhesion  (Persistent IRF/CME).   Left Eye Quality was  good. Central Foveal Thickness: 212. Progression has been stable. Findings include abnormal foveal contour, no IRF, no SRF, outer retinal atrophy, epiretinal membrane, preretinal fibrosis, subretinal hyper-reflective material, pigment epithelial detachment (Stable from prior).   Notes *Images captured and stored on drive  Diagnosis / Impression:  OD: CRVO w/ Persistent IRF/CME OS: diffuse ORA - Stable from prior  Clinical management:  See below  Abbreviations: NFP - Normal foveal profile. CME - cystoid macular edema. PED - pigment epithelial detachment. IRF - intraretinal fluid. SRF - subretinal fluid. EZ - ellipsoid zone. ERM - epiretinal membrane. ORA - outer retinal atrophy. ORT - outer retinal tubulation. SRHM - subretinal hyper-reflective material        Intravitreal Injection, Pharmacologic Agent - OD - Right Eye       Time Out 06/11/2019. 10:28 AM. Confirmed correct patient, procedure, site, and patient consented.   Anesthesia Topical anesthesia was used. Anesthetic medications included Lidocaine 2%, Proparacaine 0.5%.   Procedure Preparation included 5% betadine to ocular surface, eyelid speculum. A 27 gauge needle was used.   Injection:  4 mg Triescence 4091mlL injection   NDC: [phone]: 10AHX, Expiration date: 08/13/2020   Route: Intravitreal, Site: Right Eye, Waste: 0.09 mL  Post-op Post injection exam found visual acuity of at least counting fingers. The patient tolerated the procedure well. There were no complications. The patient received written and verbal post procedure care education.   Notes An AC tap was performed following injection due to elevated IOP using a 30 gauge needle on a syringe with the plunger removed. The needle was placed at the limbus at 5 oclock and approximately 0.12 cc of aqueous was removed from the anterior chamber.  Betadine was applied to the tap area before and after the paracentesis was performed. There were no complications.  The patient tolerated the procedure well. The IOP was rechecked and was found to be ~8 mmHg by tonopen.                 ASSESSMENT/PLAN:    ICD-10-CM   1. Central retinal vein occlusion with macular edema of both eyes  H34.8130 Intravitreal Injection, Pharmacologic Agent - OD - Right Eye    triamcinolone acetonide (TRIESENCE) 40 MG/ML subtenons injection 4 mg    CANCELED: Injection into Tenon's Capsule - OD - Right Eye  2. Moderate nonproliferative diabetic retinopathy of both eyes with macular edema associated with type 2 diabetes mellitus ([place])  M19.6222   3. Retinal edema  H35.81 OCT, Retina - OU - Both Eyes  4. Essential hypertension  I10   5. Hypertensive retinopathy of both eyes  H35.033   6. Combined forms of age-related cataract of left eye  H25.812   7. Pseudophakia  Z96.1   8. Primary open angle glaucoma (POAG) of right eye, mild stage  H40.1111     1. CRVO w/ CME OU  - pt of Dr. Zigmund Daniel who wished to transfer care  - OS long-standing low vision (HM) with subfoveal scar and central retinal atrophy  - OD with CME actively being managed with Ozurdex OD ~q5 wks by Dr. Zigmund Daniel (last on 8.20.20) s/p multiple IVTA and Ozurdex OD  - was due for Ozurdex OD w/ Zigmund Daniel on 9.25.20, but underwent cataract surgery OD w/ Kathlen Mody on 9.21.20  - s/p IVTA OD #4 (10.26.20), #5 (12.10.20), #6 (01.21.21), #7 (03.05.21), #8 (04.16.21)  - today BCVA OD 20/80 (stable)  - exam shows no residual IVTA in vitreous cavity  - OCT shows diffuse macular edema slightly stable from prior -- likely multifactorial -- CRVO, DME, post-cataract CME  - recommend IVTA OD #9 today, 05.28.21  - pt wishes to proceed  - RBA of procedure discussed, questions answered  - informed consent obtained and signed  - see procedure note -- AC tap  - f/u in 6 wks -- DFE/OCT/possible injection  2,3. Severe Non-proliferative diabetic retinopathy OU  - The incidence, risk factors for progression, natural history and  treatment options for diabetic retinopathy  were discussed with patient.    - The need for close monitoring of blood glucose, blood pressure, and serum lipids, avoiding cigarette or any type of tobacco, and the need for long term follow up was also discussed with patient.  - OCT shows diabetic macular edema, OD   - recommend IVTA OD today, 05.28.21 as above for CRVO  4,5. Hypertensive retinopathy OU  - discussed importance of tight BP control  - monitor  6. Mixed form age related cataract OS  - under the expert management of Dr. Kathlen Mody  7. Pseudophakia OD  - s/p CE/IOL OD (9.21.20) by expert surgeon, Dr. Kathlen Mody  - IOL in excellent position  - had post op IOP spike -- IOP now under better control  - monitor  8. POAG OD  - under the expert management of Dr. Kathlen Mody  - s/p SLT and goniotomy OD  - had post-cataract IOP spike  - IOP 12 today  Ophthalmic Meds Ordered this visit:  Meds ordered this encounter  Medications  . triamcinolone acetonide (TRIESENCE) 40 MG/ML subtenons injection 4 mg       Return in about 6 weeks (around 07/23/2019) for f/u CRVO OU, DFE, OCT.  There are no Patient Instructions on file for this visit.   Explained the diagnoses, plan, and follow up with the patient and they expressed understanding.  Patient expressed understanding of the importance of proper follow up care.   This document serves as a record of services personally performed by Gardiner Sleeper, MD, PhD. It was created on their behalf by Ernest Mallick, OA, an ophthalmic assistant. The creation of this record is the provider's dictation and/or activities during the visit.    Electronically signed by: Ernest Mallick, OA 05.25.2021 12:50 AM   Gardiner Sleeper, M.D., Ph.D. Diseases & Surgery of the Retina and Vitreous [facility]  I have reviewed the above documentation for accuracy and completeness, and I agree with the above. Gardiner Sleeper, M.D., Ph.D. 06/14/19 12:50  AM   Abbreviations: M myopia (nearsighted); A astigmatism; H hyperopia (farsighted); P presbyopia; Mrx spectacle prescription;  CTL contact lenses; OD right eye; OS left eye; OU both eyes  XT exotropia; ET esotropia; PEK punctate epithelial keratitis; PEE punctate epithelial erosions; DES dry eye syndrome; MGD meibomian gland dysfunction; ATs artificial tears; PFAT's preservative free artificial tears; [place] nuclear sclerotic cataract; PSC posterior subcapsular cataract; ERM epi-retinal membrane; PVD posterior vitreous detachment; RD retinal detachment; DM diabetes mellitus; DR diabetic retinopathy; NPDR non-proliferative diabetic retinopathy; PDR proliferative diabetic retinopathy; CSME clinically significant macular edema; DME diabetic macular edema; dbh dot blot hemorrhages; CWS cotton wool spot; POAG primary open angle glaucoma; C/D cup-to-disc ratio; HVF humphrey visual field; GVF goldmann visual field; OCT optical coherence tomography; IOP intraocular pressure; BRVO Branch retinal vein occlusion; CRVO central retinal vein occlusion; CRAO central retinal artery occlusion; BRAO branch retinal artery occlusion; RT retinal tear; SB scleral buckle; PPV pars plana vitrectomy; VH Vitreous hemorrhage; PRP panretinal laser photocoagulation; IVK intravitreal kenalog; VMT vitreomacular traction; MH Macular hole;  NVD neovascularization of the disc; NVE neovascularization elsewhere; AREDS age related eye disease study; ARMD age related macular degeneration; POAG primary open angle glaucoma; EBMD epithelial/anterior basement membrane dystrophy; ACIOL anterior chamber intraocular lens; IOL intraocular lens; PCIOL posterior chamber intraocular lens; Phaco/IOL phacoemulsification with intraocular lens placement; [place] photorefractive keratectomy; LASIK laser assisted in situ keratomileusis; HTN hypertension; DM diabetes mellitus; COPD chronic obstructive pulmonary disease

## 2019-06-10 DIAGNOSIS — M25512 Pain in left shoulder: Secondary | ICD-10-CM | POA: Diagnosis not present

## 2019-06-10 DIAGNOSIS — M25561 Pain in right knee: Secondary | ICD-10-CM | POA: Diagnosis not present

## 2019-06-10 DIAGNOSIS — M150 Primary generalized (osteo)arthritis: Secondary | ICD-10-CM | POA: Diagnosis not present

## 2019-06-10 DIAGNOSIS — M25572 Pain in left ankle and joints of left foot: Secondary | ICD-10-CM | POA: Diagnosis not present

## 2019-06-11 ENCOUNTER — Ambulatory Visit (INDEPENDENT_AMBULATORY_CARE_PROVIDER_SITE_OTHER): Payer: Medicare Other | Admitting: Ophthalmology

## 2019-06-11 ENCOUNTER — Other Ambulatory Visit: Payer: Self-pay

## 2019-06-11 ENCOUNTER — Encounter (INDEPENDENT_AMBULATORY_CARE_PROVIDER_SITE_OTHER): Payer: Self-pay | Admitting: Ophthalmology

## 2019-06-11 DIAGNOSIS — I10 Essential (primary) hypertension: Secondary | ICD-10-CM | POA: Diagnosis not present

## 2019-06-11 DIAGNOSIS — Z961 Presence of intraocular lens: Secondary | ICD-10-CM

## 2019-06-11 DIAGNOSIS — H34813 Central retinal vein occlusion, bilateral, with macular edema: Secondary | ICD-10-CM | POA: Diagnosis not present

## 2019-06-11 DIAGNOSIS — H35033 Hypertensive retinopathy, bilateral: Secondary | ICD-10-CM | POA: Diagnosis not present

## 2019-06-11 DIAGNOSIS — E113313 Type 2 diabetes mellitus with moderate nonproliferative diabetic retinopathy with macular edema, bilateral: Secondary | ICD-10-CM | POA: Diagnosis not present

## 2019-06-11 DIAGNOSIS — H401111 Primary open-angle glaucoma, right eye, mild stage: Secondary | ICD-10-CM

## 2019-06-11 DIAGNOSIS — H3581 Retinal edema: Secondary | ICD-10-CM

## 2019-06-11 DIAGNOSIS — H25812 Combined forms of age-related cataract, left eye: Secondary | ICD-10-CM

## 2019-06-14 MED ORDER — TRIAMCINOLONE ACETONIDE 40 MG/ML IO SUSP
4.0000 mg | INTRAOCULAR | Status: AC | PRN
  Administered 2019-06-14: 4 mg via INTRAVITREAL

## 2019-06-16 ENCOUNTER — Other Ambulatory Visit: Payer: Self-pay | Admitting: Internal Medicine

## 2019-06-16 ENCOUNTER — Other Ambulatory Visit: Payer: Self-pay

## 2019-06-16 ENCOUNTER — Ambulatory Visit (INDEPENDENT_AMBULATORY_CARE_PROVIDER_SITE_OTHER): Payer: Medicare Other | Admitting: Internal Medicine

## 2019-06-16 ENCOUNTER — Encounter: Payer: Self-pay | Admitting: Internal Medicine

## 2019-06-16 VITALS — BP 140/86 | HR 74 | Temp 98.4°F | Ht 67.0 in

## 2019-06-16 DIAGNOSIS — I129 Hypertensive chronic kidney disease with stage 1 through stage 4 chronic kidney disease, or unspecified chronic kidney disease: Secondary | ICD-10-CM | POA: Diagnosis not present

## 2019-06-16 DIAGNOSIS — R0600 Dyspnea, unspecified: Secondary | ICD-10-CM | POA: Diagnosis not present

## 2019-06-16 DIAGNOSIS — E039 Hypothyroidism, unspecified: Secondary | ICD-10-CM | POA: Diagnosis not present

## 2019-06-16 DIAGNOSIS — Z79899 Other long term (current) drug therapy: Secondary | ICD-10-CM | POA: Diagnosis not present

## 2019-06-16 DIAGNOSIS — L309 Dermatitis, unspecified: Secondary | ICD-10-CM | POA: Diagnosis not present

## 2019-06-16 DIAGNOSIS — R600 Localized edema: Secondary | ICD-10-CM | POA: Diagnosis not present

## 2019-06-16 DIAGNOSIS — R269 Unspecified abnormalities of gait and mobility: Secondary | ICD-10-CM

## 2019-06-16 MED ORDER — TRIAMCINOLONE ACETONIDE 0.1 % EX CREA: 1.0000 "application " | TOPICAL_CREAM | Freq: Two times a day (BID) | CUTANEOUS | 0 refills | Status: DC

## 2019-06-16 NOTE — Patient Instructions (Signed)
Edema  Edema is when you have too much fluid in your body or under your skin. Edema may make your legs, feet, and ankles swell up. Swelling is also common in looser tissues, like around your eyes. This is a common condition. It gets more common as you get older. There are many possible causes of edema. Eating too much salt (sodium) and being on your feet or sitting for a long time can cause edema in your legs, feet, and ankles. Hot weather may make edema worse. Edema is usually painless. Your skin may look swollen or shiny. Follow these instructions at home:  Keep the swollen body part raised (elevated) above the level of your heart when you are sitting or lying down.  Do not sit still or stand for a long time.  Do not wear tight clothes. Do not wear garters on your upper legs.  Exercise your legs. This can help the swelling go down.  Wear elastic bandages or support stockings as told by your doctor.  Eat a low-salt (low-sodium) diet to reduce fluid as told by your doctor.  Depending on the cause of your swelling, you may need to limit how much fluid you drink (fluid restriction).  Take over-the-counter and prescription medicines only as told by your doctor. Contact a doctor if:  Treatment is not working.  You have heart, liver, or kidney disease and have symptoms of edema.  You have sudden and unexplained weight gain. Get help right away if:  You have shortness of breath or chest pain.  You cannot breathe when you lie down.  You have pain, redness, or warmth in the swollen areas.  You have heart, liver, or kidney disease and get edema all of a sudden.  You have a fever and your symptoms get worse all of a sudden. Summary  Edema is when you have too much fluid in your body or under your skin.  Edema may make your legs, feet, and ankles swell up. Swelling is also common in looser tissues, like around your eyes.  Raise (elevate) the swollen body part above the level of your  heart when you are sitting or lying down.  Follow your doctor's instructions about diet and how much fluid you can drink (fluid restriction). This information is not intended to replace advice given to you by your health care provider. Make sure you discuss any questions you have with your health care provider. Document Revised: 01/03/2017 Document Reviewed: 01/19/2016 Elsevier Patient Education  2020 [employer]vier Inc.  

## 2019-06-17 LAB — CMP14+EGFR
ALT: 13 IU/L (ref 0–32)
AST: 16 IU/L (ref 0–40)
Albumin/Globulin Ratio: 1.2 (ref 1.2–2.2)
Albumin: 4.4 g/dL (ref 3.7–4.7)
Alkaline Phosphatase: 104 IU/L (ref 48–121)
BUN/Creatinine Ratio: 28 (ref 12–28)
BUN: 33 mg/dL — ABNORMAL HIGH (ref 8–27)
Bilirubin Total: 0.6 mg/dL (ref 0.0–1.2)
CO2: 24 mmol/L (ref 20–29)
Calcium: 9.9 mg/dL (ref 8.7–10.3)
Chloride: 101 mmol/L (ref 96–106)
Creatinine, Ser: 1.2 mg/dL — ABNORMAL HIGH (ref 0.57–1.00)
GFR calc Af Amer: 53 mL/min/{1.73_m2} — ABNORMAL LOW (ref >59)
GFR calc non Af Amer: 46 mL/min/{1.73_m2} — ABNORMAL LOW (ref >59)
Globulin, Total: 3.6 g/dL (ref 1.5–4.5)
Glucose: 84 mg/dL (ref 65–99)
Potassium: 4.5 mmol/L (ref 3.5–5.2)
Sodium: 139 mmol/L (ref 134–144)
Total Protein: 8 g/dL (ref 6.0–8.5)

## 2019-06-17 LAB — BRAIN NATRIURETIC PEPTIDE: BNP: 11.2 pg/mL (ref 0.0–100.0)

## 2019-06-17 LAB — T4, FREE: Free T4: 2.01 ng/dL — ABNORMAL HIGH (ref 0.82–1.77)

## 2019-06-17 LAB — TSH: TSH: 2.88 u[IU]/mL (ref 0.450–4.500)

## 2019-06-18 ENCOUNTER — Ambulatory Visit (HOSPITAL_COMMUNITY): Payer: Medicare Other

## 2019-06-19 ENCOUNTER — Telehealth: Payer: Self-pay

## 2019-06-19 NOTE — Telephone Encounter (Signed)
-----   Message from Dorothyann Peng, MD sent at 06/17/2019 10:03 PM EDT ----- Please fax labs to Dr. Talmage Nap.  She may adjust your thyroid meds. Your kidney function is stable. I will let you know when we get ultrasound results.

## 2019-06-20 ENCOUNTER — Other Ambulatory Visit: Payer: Self-pay | Admitting: Internal Medicine

## 2019-06-23 ENCOUNTER — Ambulatory Visit (HOSPITAL_COMMUNITY)
Admission: RE | Admit: 2019-06-23 | Discharge: 2019-06-23 | Disposition: A | Discharge status: Home / Self Care | Payer: Medicare Other | Source: Ambulatory Visit | Attending: Internal Medicine | Admitting: Internal Medicine

## 2019-06-23 ENCOUNTER — Other Ambulatory Visit: Payer: Self-pay

## 2019-06-23 ENCOUNTER — Telehealth: Payer: Self-pay

## 2019-06-23 DIAGNOSIS — R600 Localized edema: Secondary | ICD-10-CM | POA: Diagnosis not present

## 2019-06-23 NOTE — Telephone Encounter (Signed)
The pt was told that her LLE venous doppler was negative for DVT and baker's cyst.  The pt said that her leg is still swollen and wants to know what she should do next.

## 2019-06-23 NOTE — Telephone Encounter (Signed)
Advise pt to elevate her legs and limit her salt intake. Avoid processed meats - bacon, sausage, deli meats, lumch meat.

## 2019-06-23 NOTE — Progress Notes (Signed)
Left lower extremity venous duplex completed. Refer to "CV Proc" under chart review to view preliminary results.  06/23/2019 10:28 AM Eula Fried., MHA, RVT, RDCS, RDMS

## 2019-06-25 ENCOUNTER — Other Ambulatory Visit: Payer: Self-pay | Admitting: Internal Medicine

## 2019-06-28 ENCOUNTER — Emergency Department (HOSPITAL_COMMUNITY)
Admission: EM | Admit: 2019-06-28 | Discharge: 2019-06-28 | Disposition: A | Discharge status: Home / Self Care | Payer: Medicare Other | Attending: Emergency Medicine | Admitting: Emergency Medicine

## 2019-06-28 ENCOUNTER — Other Ambulatory Visit: Payer: Self-pay

## 2019-06-28 ENCOUNTER — Encounter (HOSPITAL_COMMUNITY): Payer: Self-pay

## 2019-06-28 DIAGNOSIS — Z87891 Personal history of nicotine dependence: Secondary | ICD-10-CM | POA: Insufficient documentation

## 2019-06-28 DIAGNOSIS — N183 Chronic kidney disease, stage 3 unspecified: Secondary | ICD-10-CM | POA: Insufficient documentation

## 2019-06-28 DIAGNOSIS — E1122 Type 2 diabetes mellitus with diabetic chronic kidney disease: Secondary | ICD-10-CM | POA: Insufficient documentation

## 2019-06-28 DIAGNOSIS — Z7982 Long term (current) use of aspirin: Secondary | ICD-10-CM | POA: Diagnosis not present

## 2019-06-28 DIAGNOSIS — E113593 Type 2 diabetes mellitus with proliferative diabetic retinopathy without macular edema, bilateral: Secondary | ICD-10-CM | POA: Insufficient documentation

## 2019-06-28 DIAGNOSIS — Z96651 Presence of right artificial knee joint: Secondary | ICD-10-CM | POA: Insufficient documentation

## 2019-06-28 DIAGNOSIS — Z79899 Other long term (current) drug therapy: Secondary | ICD-10-CM | POA: Insufficient documentation

## 2019-06-28 DIAGNOSIS — R600 Localized edema: Secondary | ICD-10-CM | POA: Diagnosis not present

## 2019-06-28 DIAGNOSIS — E1140 Type 2 diabetes mellitus with diabetic neuropathy, unspecified: Secondary | ICD-10-CM | POA: Insufficient documentation

## 2019-06-28 DIAGNOSIS — I129 Hypertensive chronic kidney disease with stage 1 through stage 4 chronic kidney disease, or unspecified chronic kidney disease: Secondary | ICD-10-CM | POA: Insufficient documentation

## 2019-06-28 DIAGNOSIS — R2243 Localized swelling, mass and lump, lower limb, bilateral: Secondary | ICD-10-CM | POA: Diagnosis present

## 2019-06-28 LAB — COMPREHENSIVE METABOLIC PANEL
ALT: 15 U/L (ref 0–44)
AST: 25 U/L (ref 15–41)
Albumin: 3.9 g/dL (ref 3.5–5.0)
Alkaline Phosphatase: 82 U/L (ref 38–126)
Anion gap: 11 (ref 5–15)
BUN: 22 mg/dL (ref 8–23)
CO2: 22 mmol/L (ref 22–32)
Calcium: 9.2 mg/dL (ref 8.9–10.3)
Chloride: 106 mmol/L (ref 98–111)
Creatinine, Ser: 1.15 mg/dL — ABNORMAL HIGH (ref 0.44–1.00)
GFR calc Af Amer: 55 mL/min — ABNORMAL LOW (ref >60)
GFR calc non Af Amer: 48 mL/min — ABNORMAL LOW (ref >60)
Glucose, Bld: 81 mg/dL (ref 70–99)
Potassium: 3.6 mmol/L (ref 3.5–5.1)
Sodium: 139 mmol/L (ref 135–145)
Total Bilirubin: 1.2 mg/dL (ref 0.3–1.2)
Total Protein: 7.9 g/dL (ref 6.5–8.1)

## 2019-06-28 LAB — CBC WITH DIFFERENTIAL/PLATELET
Abs Immature Granulocytes: 0.01 10*3/uL (ref 0.00–0.07)
Basophils Absolute: 0 10*3/uL (ref 0.0–0.1)
Basophils Relative: 1 %
Eosinophils Absolute: 0.1 10*3/uL (ref 0.0–0.5)
Eosinophils Relative: 2 %
HCT: 33.5 % — ABNORMAL LOW (ref 36.0–46.0)
Hemoglobin: 11.2 g/dL — ABNORMAL LOW (ref 12.0–15.0)
Immature Granulocytes: 0 %
Lymphocytes Relative: 23 %
Lymphs Abs: 1.3 10*3/uL (ref 0.7–4.0)
MCH: 31.6 pg (ref 26.0–34.0)
MCHC: 33.4 g/dL (ref 30.0–36.0)
MCV: 94.6 fL (ref 80.0–100.0)
Monocytes Absolute: 0.6 10*3/uL (ref 0.1–1.0)
Monocytes Relative: 10 %
Neutro Abs: 3.6 10*3/uL (ref 1.7–7.7)
Neutrophils Relative %: 64 %
Platelets: 225 10*3/uL (ref 150–400)
RBC: 3.54 MIL/uL — ABNORMAL LOW (ref 3.87–5.11)
RDW: 12.3 % (ref 11.5–15.5)
WBC: 5.5 10*3/uL (ref 4.0–10.5)
nRBC: 0 % (ref 0.0–0.2)

## 2019-06-28 LAB — BRAIN NATRIURETIC PEPTIDE: B Natriuretic Peptide: 13.4 pg/mL (ref 0.0–100.0)

## 2019-06-28 MED ORDER — MORPHINE SULFATE (PF) 4 MG/ML IV SOLN
4.0000 mg | Freq: Once | INTRAVENOUS | Status: AC
  Administered 2019-06-28: 4 mg via INTRAVENOUS
  Filled 2019-06-28: qty 1

## 2019-06-28 MED ORDER — FUROSEMIDE 10 MG/ML IJ SOLN
20.0000 mg | Freq: Once | INTRAMUSCULAR | Status: AC
  Administered 2019-06-28: 20 mg via INTRAVENOUS
  Filled 2019-06-28: qty 4

## 2019-06-28 NOTE — Discharge Instructions (Addendum)
Your laboratory results were within normal limits. Maintained your legs elevated while not walking.   Your primary care physician may provided a referral for wound care in order to obtain leg wrappings to help with swelling.

## 2019-06-28 NOTE — ED Triage Notes (Signed)
Patient c/o leg swelling x 1 month. L>R. Patient reports that she had an US done on 06/23/19 and it was negative for a blood clot. Patient states she is having increased pain in her legs especially when walking.

## 2019-06-28 NOTE — ED Provider Notes (Signed)
[facility] DEPT Provider Note   CSN: [id number] Arrival date & time: 06/28/19  4034     History Chief Complaint  Patient presents with  . Leg Swelling    Mary [name] is a 74 y.o. female.  74 y.o female with a PMH of Diabetic neuropathy, HTN, DM, CKD3 presents to the ED with a chief complaint of BL leg swelling x 1 month.  Patient reports pain along bilateral legs although her left leg feels more swollen than her right 1.  She was seen by her primary care physician on June 2, was given a referral for an ultrasound.  She had a negative DVT study on 9 June.  States swelling does not change according to the time of the day, does report that there is more pain with standing along with ambulating.  Describes the pain which is stabbing in nature from her ankles all the way up to her knee.  Reports she has not taken anything for swelling, she is currently on fluid pills, states she did not take them yesterday as she did not want to be walking to the bathroom in the middle of the night.  Reports she has been not eating salt, has been drinking water.  She is currently on Lasix for fluid control.  Patient takes hydrocodone 10 mg every 5 hours for pain control. No fever, no trauma, no other complaints.  Of note, patient currently followed for a right plantar ulcer that receives debridement.    The history is provided by the patient and medical records.       Past Medical History:  Diagnosis Date  . Arthritis    osteoarthritis  . Cataract    OS  . Diabetes mellitus    Insulin dependent  . Diabetic retinopathy ([place])    NPDR OU  . Glaucoma    POAG OD  . H/O cardiovascular stress test    pt. reports 10/13- was normal per pt.   . Hyperlipemia   . Hypertension   . Hypertensive retinopathy    OU    Patient Active Problem List   Diagnosis Date Noted  . Diabetes mellitus with stage 3 chronic kidney disease ([place]) 02/21/2018  . Parenchymal renal  hypertension 02/21/2018  . Primary osteoarthritis of both knees 02/21/2018  . Class 3 severe obesity due to excess calories with serious comorbidity and body mass index (BMI) of 45.0 to 49.9 in adult ([place]) 02/21/2018  . Skin ulcer of right foot with fat layer exposed ([place]) 02/18/2018  . Morbid (severe) obesity due to excess calories ([place]) 11/18/2017  . Knee pain 07/16/2015  . Lateral dislocation of right patella 05/04/2015  . Rupture of right quadriceps tendon 05/24/2014  . S/P TKR (total knee replacement) 05/05/2012    Past Surgical History:  Procedure Laterality Date  . CARPAL TUNNEL RELEASE Bilateral   . CATARACT EXTRACTION Right 10/05/2018   Dr. Kathlen Mody  . EYE SURGERY Right    Cat Sx  . KNEE ARTHROSCOPY     right  . QUADRICEPS TENDON REPAIR  02/17/2012   Procedure: REPAIR QUADRICEP TENDON;  Surgeon: Vickey Huger, MD;  Location: [facility];  Service: Orthopedics;  Laterality: Right;  right quadriceps repair with latera release  . QUADRICEPS TENDON REPAIR Right 05/15/2012   Procedure: REPAIR QUADRICEP TENDON;  Surgeon: Vickey Huger, MD;  Location: [facility];  Service: Orthopedics;  Laterality: Right;  . TOTAL KNEE ARTHROPLASTY  10/21/2011   rt tk  . TOTAL KNEE ARTHROPLASTY  10/21/2011  Procedure: TOTAL KNEE ARTHROPLASTY;  Surgeon: Rudean Haskell, MD;  Location: [facility];  Service: Orthopedics;  Laterality: Right;  . TUBAL LIGATION       OB History   No obstetric history on file.     Family History  Problem Relation Age of Onset  . Hypertension Mother   . Heart Problems Father   . Gout Father   . Arthritis Father     Social History   Tobacco Use  . Smoking status: Former Smoker    Packs/day: 0.25    Years: 20.00    Pack years: 5.00    Types: Cigarettes    Quit date: 06/28/2014    Years since quitting: 5.0  . Smokeless tobacco: Never Used  Vaping Use  . Vaping Use: Never used  Substance Use Topics  . Alcohol use: No  . Drug use: Not Currently    Types: Oxycodone, Marijuana     Home Medications Prior to Admission medications   Medication Sig Start Date End Date Taking? Authorizing Provider  ACCU-CHEK AVIVA PLUS test strip See admin instructions. for testing 11/21/17   [provider]  aspirin EC 81 MG tablet Take 81 mg by mouth daily.    [provider]  Assure Comfort Lancets 28G [place]  05/17/14   [provider]  Blood Glucose Monitoring Suppl (ACCU-CHEK AVIVA PLUS) w/Device KIT Use as directed to check blood sugars 2 times per day dx: e11.65 03/10/18   Glendale Chard, MD  cetirizine (ZYRTEC) 10 MG tablet TAKE 1 TABLET BY MOUTH EVERY DAY 05/25/19   Glendale Chard, MD  dorzolamide-timolol (COSOPT) 22.3-6.8 MG/ML ophthalmic solution Place 1 drop into the right eye 2 (two) times daily. 03/01/19   [provider]  fluorometholone (FML) 0.1 % ophthalmic suspension Place 1 drop into the right eye 3 (three) times daily. 12/23/18   [provider]  fluticasone (FLONASE) 50 MCG/ACT nasal spray SPRAY 1 SPRAY INTO EACH NOSTRIL EVERY DAY 06/21/19   Glendale Chard, MD  furosemide (LASIX) 40 MG tablet Take 1 tablet (40 mg total) by mouth daily. 02/22/19   Minette Brine, FNP  HYDROcodone-acetaminophen (NORCO) 10-325 MG tablet Take 1 tablet by mouth 5 (five) times daily as needed. 11/27/18   [provider]  KLOR-CON M20 20 MEQ tablet Take 20 mEq by mouth daily. with food 10/15/16   [provider]  levothyroxine (SYNTHROID) 125 MCG tablet Take 125 mcg by mouth daily before breakfast.    [provider]  loratadine (CLARITIN) 10 MG tablet Take 10 mg by mouth daily.    [provider]  magnesium oxide (MAG-OX) 400 MG tablet TAKE 1 TABLET BY MOUTH EVERY EVENING 06/28/19   Glendale Chard, MD  metoprolol tartrate (LOPRESSOR) 50 MG tablet Take 50 mg by mouth 2 (two) times daily.  03/01/19   [provider]  omeprazole (PRILOSEC) 20 MG capsule TAKE 1 CAPSULE BY MOUTH  DAILY 09/29/18   Glendale Chard, MD   pravastatin (PRAVACHOL) 40 MG tablet Take 1 tablet (40 mg total) by mouth daily. 01/27/19   Glendale Chard, MD  telmisartan (MICARDIS) 80 MG tablet TAKE 1 TABLET BY MOUTH  DAILY 12/03/18   Glendale Chard, MD  triamcinolone cream (KENALOG) 0.1 % APPLY TO AFFECTED AREA TWICE A DAY 06/28/19   Glendale Chard, MD    Allergies    Patient has no known allergies.  Review of Systems   Review of Systems  Constitutional: Negative for chills and fever.  HENT: Negative for sore  throat.   Respiratory: Negative for shortness of breath.   Cardiovascular: Positive for leg swelling (BL). Negative for chest pain.  Gastrointestinal: Negative for abdominal pain, nausea and vomiting.  Genitourinary: Negative for difficulty urinating.  Musculoskeletal: Positive for myalgias. Negative for back pain, neck pain and neck stiffness.  Neurological: Negative for light-headedness and headaches.  All other systems reviewed and are negative.   Physical Exam Updated Vital Signs BP (!) 192/104 (BP Location: Left Arm)   Pulse 76   Temp 98.1 F (36.7 C) (Oral)   Resp 16   Ht _0  (1.702 m)   Wt 131.5 kg   SpO2 97%   BMI 45.42 kg/m   Physical Exam Vitals and nursing note reviewed.  Constitutional:      Appearance: Normal appearance. She is not ill-appearing or toxic-appearing.  HENT:     Head: Normocephalic and atraumatic.     Nose: Nose normal.     Mouth/Throat:     Mouth: Mucous membranes are moist.  Cardiovascular:     Rate and Rhythm: Normal rate.     Comments: BL pitting edema, worst L> R. Ulceration noted on right plantar aspect, no active drainage.  Pulmonary:     Effort: Pulmonary effort is normal.     Breath sounds: Normal breath sounds. No wheezing or rales.  Abdominal:     General: Abdomen is flat.     Tenderness: There is no abdominal tenderness. There is no right CVA tenderness or left CVA tenderness.  Musculoskeletal:        General: Swelling present.     Right lower leg: No swelling.  1+ Pitting Edema present.     Left lower leg: No swelling. 1+ Pitting Edema present.  Skin:    General: Skin is warm and dry.  Neurological:     Mental Status: She is alert and oriented to person, place, and time.     ED Results / Procedures / Treatments   Labs (all labs ordered are listed, but only abnormal results are displayed) Labs Reviewed  CBC WITH DIFFERENTIAL/PLATELET - Abnormal; Notable for the following components:      Result Value   RBC 3.54 (*)    Hemoglobin 11.2 (*)    HCT 33.5 (*)    All other components within normal limits  COMPREHENSIVE METABOLIC PANEL - Abnormal; Notable for the following components:   Creatinine, Ser 1.15 (*)    GFR calc non Af Amer 48 (*)    GFR calc Af Amer 55 (*)    All other components within normal limits  BRAIN NATRIURETIC PEPTIDE    EKG None  Radiology No results found.  Procedures Procedures (including critical care time)  Medications Ordered in ED Medications  furosemide (LASIX) injection 20 mg (20 mg Intravenous Given 06/28/19 1057)  morphine 4 MG/ML injection 4 mg (4 mg Intravenous Given 06/28/19 1057)    ED Course  I have reviewed the triage vital signs and the nursing notes.  Pertinent labs & imaging results that were available during my care of the patient were reviewed by me and considered in my medical decision making (see chart for details).    MDM Rules/Calculators/A&P  Patient with extensive PMH such as diabetic neuropathy, CKD3, Morbid obesity presents to the ED with a chief complaint of BL leg swelling and pain x 1 month. I have obtained records from chart reviewed, seen by Dr. Baird Cancer PCP on 06/02 had a DVT study 06/09 which was negative. Reports modifying her salt intake,  it is currently taking fluid pills, did not take these last night due to not wanting to ambulate because of pain.  During evaluation patient is overall nonill, nontoxic appearing, does have significant swelling to bilateral legs, left worse  than right.  Palpable pulses are unable to obtain due to swelling.  No pain with motion of ankle joint, knee joint. Pitting 1 + BL. There is no erythema, streaking, he is in the skin I have a low suspicion for cellulitis. She is followed by [facility] for her ongoing ulcer, she receives intermittent debridements of her wound. No fever, no other complaints.   Interpretation of her labs showed a BNP which is stable at this time, she is currently on Lasix therapy.  Her CMP did not show any electrolyte derangement, her creatinine level is actually improved from her previous visits.  LFTs are within normal limits, does not have any abdominal pain on today's visit.  CBC without any leukocytosis, hemoglobin is within her baseline.  She was provided with morphine to help with pain along with an IV dose of Lasix.   No source of infection, recent negative DVT study. No trauma, no imaging necessary at this time. Results of labs discussed with patient, advised to follow up with PCP and obtain referral for wound care in order to obtain wound wrapping.Patient understands and agrees with management. Return precautions discussed at length.    Portions of this note were generated with Lobbyist. Dictation errors may occur despite best attempts at proofreading.  Final Clinical Impression(s) / ED Diagnoses Final diagnoses:  Bilateral lower extremity edema    Rx / DC Orders ED Discharge Orders    None       Janeece Fitting, Hershal Coria 06/28/19 1202    Dorie Rank, MD 06/30/19 1035

## 2019-06-29 ENCOUNTER — Telehealth: Payer: Self-pay

## 2019-06-29 NOTE — Telephone Encounter (Signed)
Dr. Allyne Gee would like to know if the pt agrees to have a wound care referral.

## 2019-07-06 ENCOUNTER — Telehealth: Payer: Self-pay | Admitting: *Deleted

## 2019-07-06 ENCOUNTER — Ambulatory Visit (INDEPENDENT_AMBULATORY_CARE_PROVIDER_SITE_OTHER): Payer: Medicare Other | Admitting: Sports Medicine

## 2019-07-06 ENCOUNTER — Encounter: Payer: Self-pay | Admitting: Sports Medicine

## 2019-07-06 ENCOUNTER — Other Ambulatory Visit: Payer: Self-pay

## 2019-07-06 VITALS — Temp 95.4°F

## 2019-07-06 DIAGNOSIS — L84 Corns and callosities: Secondary | ICD-10-CM

## 2019-07-06 DIAGNOSIS — I890 Lymphedema, not elsewhere classified: Secondary | ICD-10-CM

## 2019-07-06 DIAGNOSIS — L97511 Non-pressure chronic ulcer of other part of right foot limited to breakdown of skin: Secondary | ICD-10-CM | POA: Diagnosis not present

## 2019-07-06 DIAGNOSIS — M79674 Pain in right toe(s): Secondary | ICD-10-CM

## 2019-07-06 DIAGNOSIS — B351 Tinea unguium: Secondary | ICD-10-CM

## 2019-07-06 DIAGNOSIS — I739 Peripheral vascular disease, unspecified: Secondary | ICD-10-CM

## 2019-07-06 DIAGNOSIS — E1142 Type 2 diabetes mellitus with diabetic polyneuropathy: Secondary | ICD-10-CM | POA: Diagnosis not present

## 2019-07-06 DIAGNOSIS — M79675 Pain in left toe(s): Secondary | ICD-10-CM

## 2019-07-06 NOTE — Telephone Encounter (Signed)
Yes lets get the edema under control if they can't also do the wound care sub met 1 on right at [facility] then she will also need referral to wound care center as well Thanks Dr. Chauncey Cruel

## 2019-07-06 NOTE — Telephone Encounter (Signed)
-----   Message from Asencion Islam, [place] sent at 07/06/2019  1:02 PM EDT ----- Regarding: Refer to wound center Lymphedema management Right foot ulcer submet 1 grade 2

## 2019-07-06 NOTE — Progress Notes (Signed)
Subjective: Mary [name] is a 74 y.o. female patient seen in office for evaluation of ulceration of the Right foot and for nail care. Patient reports that the area on right foot remains dry and has not had any problems but swelling is getting worse on bilateral legs, no drainage or bleeding from area of ulceration on right. Denies nausea/fever/vomiting/chills/night sweats/shortness of breath/pain. Patient has no other pedal complaints at this time.  FBS not recorded today.   Patient Active Problem List   Diagnosis Date Noted  . Diabetes mellitus with stage 3 chronic kidney disease ([place]) 02/21/2018  . 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 Primary osteoarthritis of both knees 02/21/2018  . Class 3 severe obesity due to excess calories with serious comorbidity and body mass index (BMI) of 45.0 to 49.9 in adult ([place]) 02/21/2018  . Skin ulcer of right foot with fat layer exposed ([place]) 02/18/2018  . Morbid (severe) obesity due to excess calories ([place]) 11/18/2017  . Knee pain 07/16/2015  . Lateral dislocation of right patella 05/04/2015  . Rupture of right quadriceps tendon 05/24/2014  . S/P TKR (total knee replacement) 05/05/2012   Current Outpatient Medications on File Prior to Visit  Medication Sig Dispense Refill  . ACCU-CHEK AVIVA PLUS test strip See admin instructions. for testing  11  . aspirin EC 81 MG tablet Take 81 mg by mouth daily.    [name] 28G MISC     . Blood Glucose Monitoring Suppl (ACCU-CHEK AVIVA PLUS) w/Device KIT Use as directed to check blood sugars 2 times per day dx: e11.65 1 kit 1  . cetirizine (ZYRTEC) 10 MG tablet TAKE 1 TABLET BY MOUTH EVERY DAY 90 tablet 1  . dorzolamide-timolol (COSOPT) 22.3-6.8 MG/ML ophthalmic solution Place 1 drop into the right eye 2 (two) times daily.    . fluorometholone (FML) 0.1 % ophthalmic suspension Place 1 drop into the right eye 3 (three) times daily.    . fluticasone (FLONASE) 50 MCG/ACT nasal spray SPRAY 1  SPRAY INTO EACH NOSTRIL EVERY DAY 32 mL 1  . furosemide (LASIX) 40 MG tablet Take 1 tablet (40 mg total) by mouth daily. 90 tablet 1  . HYDROcodone-acetaminophen (NORCO) 10-325 MG tablet Take 1 tablet by mouth 5 (five) times daily as needed.    [name] KLOR-CON M20 20 MEQ tablet Take 20 mEq by mouth daily. with food  1  . levothyroxine (SYNTHROID) 125 MCG tablet Take 125 mcg by mouth daily before breakfast.    . loratadine (CLARITIN) 10 MG tablet Take 10 mg by mouth daily.    . magnesium oxide (MAG-OX) 400 MG tablet TAKE 1 TABLET BY MOUTH EVERY EVENING 90 tablet 1  . metoprolol tartrate (LOPRESSOR) 50 MG tablet Take 50 mg by mouth 2 (two) times daily.     [name] omeprazole (PRILOSEC) 20 MG capsule TAKE 1 CAPSULE BY MOUTH  DAILY 90 capsule 3  . pravastatin (PRAVACHOL) 40 MG tablet Take 1 tablet (40 mg total) by mouth daily. 90 tablet 1  . telmisartan (MICARDIS) 80 MG tablet TAKE 1 TABLET BY MOUTH  DAILY 90 tablet 3  . triamcinolone cream (KENALOG) 0.1 % APPLY TO AFFECTED AREA TWICE A DAY 30 g 0   No current facility-administered medications on file prior to visit.   No Known Allergies  Recent Results (from the past 2160 hour(s))  TSH     Status: None   Collection Time: 06/16/19  4:05 PM  Result Value Ref Range   TSH 2.880 0.450 -  4.500 uIU/mL  T4, Free     Status: Abnormal   Collection Time: 06/16/19  4:05 PM  Result Value Ref Range   Free T4 2.01 (H) 0.82 - 1.77 ng/dL  CMP14+EGFR     Status: Abnormal   Collection Time: 06/16/19  4:05 PM  Result Value Ref Range   Glucose 84 65 - 99 mg/dL   BUN 33 (H) 8 - 27 mg/dL   Creatinine, Ser 1.20 (H) 0.57 - 1.00 mg/dL   GFR calc non Af Amer 46 (L) >59 mL/min/1.73   GFR calc Af Amer 53 (L) >59 mL/min/1.73    Comment: **[facility] currently reports eGFR in compliance with the current**   recommendations of the [employer]. [facility] will   update reporting as new guidelines are published from the NKF-ASN   Task force.    BUN/Creatinine Ratio  28 12 - 28   Sodium 139 134 - 144 mmol/L   Potassium 4.5 3.5 - 5.2 mmol/L   Chloride 101 96 - 106 mmol/L   CO2 24 20 - 29 mmol/L   Calcium 9.9 8.7 - 10.3 mg/dL   Total Protein 8.0 6.0 - 8.5 g/dL   Albumin 4.4 3.7 - 4.7 g/dL   Globulin, Total 3.6 1.5 - 4.5 g/dL   Albumin/Globulin Ratio 1.2 1.2 - 2.2   Bilirubin Total 0.6 0.0 - 1.2 mg/dL   Alkaline Phosphatase 104 48 - 121 IU/L   AST 16 0 - 40 IU/L   ALT 13 0 - 32 IU/L  Brain natriuretic peptide     Status: None   Collection Time: 06/16/19  4:46 PM  Result Value Ref Range   BNP 11.2 0.0 - 100.0 pg/mL  CBC with Differential     Status: Abnormal   Collection Time: 06/28/19 10:34 AM  Result Value Ref Range   WBC 5.5 4.0 - 10.5 K/uL   RBC 3.54 (L) 3.87 - 5.11 MIL/uL   Hemoglobin 11.2 (L) 12.0 - 15.0 g/dL   HCT 33.5 (L) 36 - 46 %   MCV 94.6 80.0 - 100.0 fL   MCH 31.6 26.0 - 34.0 pg   MCHC 33.4 30.0 - 36.0 g/dL   RDW 12.3 11.5 - 15.5 %   Platelets 225 150 - 400 K/uL   nRBC 0.0 0.0 - 0.2 %   Neutrophils Relative % 64 %   Neutro Abs 3.6 1.7 - 7.7 K/uL   Lymphocytes Relative 23 %   Lymphs Abs 1.3 0.7 - 4.0 K/uL   Monocytes Relative 10 %   Monocytes Absolute 0.6 0 - 1 K/uL   Eosinophils Relative 2 %   Eosinophils Absolute 0.1 0 - 0 K/uL   Basophils Relative 1 %   Basophils Absolute 0.0 0 - 0 K/uL   Immature Granulocytes 0 %   Abs Immature Granulocytes 0.01 0.00 - 0.07 K/uL    Comment: Performed at [facility], [place] [address], [place], [place] [address]  Brain natriuretic peptide     Status: None   Collection Time: 06/28/19 10:38 AM  Result Value Ref Range   B Natriuretic Peptide 13.4 0.0 - 100.0 pg/mL    Comment: Performed at [facility], [place] [address], [place],  [address]  Comprehensive metabolic panel     Status: Abnormal   Collection Time: 06/28/19 10:56 AM  Result Value Ref Range   Sodium 139 135 - 145 mmol/L   Potassium 3.6 3.5 - 5.1 mmol/L   Chloride 106 98 - 111 mmol/L  CO2 22 22 - 32 mmol/L   Glucose, Bld 81 70 - 99 mg/dL    Comment: Glucose reference range applies only to samples taken after fasting for at least 8 hours.   BUN 22 8 - 23 mg/dL   Creatinine, Ser 1.15 (H) 0.44 - 1.00 mg/dL   Calcium 9.2 8.9 - 10.3 mg/dL   Total Protein 7.9 6.5 - 8.1 g/dL   Albumin 3.9 3.5 - 5.0 g/dL   AST 25 15 - 41 U/L   ALT 15 0 - 44 U/L   Alkaline Phosphatase 82 38 - 126 U/L   Total Bilirubin 1.2 0.3 - 1.2 mg/dL   GFR calc non Af Amer 48 (L) >60 mL/min   GFR calc Af Amer 55 (L) >60 mL/min   Anion gap 11 5 - 15    Comment: Performed at [facility], [place] [address], [place], [place] [address]    Objective: There were no vitals filed for this visit.  General: Patient is awake, alert, oriented x 3 and in no acute distress.  Dermatology: Skin is warm and dry bilateral with a callus sub met 1 on right, once debrided there was a partial-thickness ulcer noted that measures 0.3 x 0.2 x 0.2 cm with a granular base and mild periwound maceration, there is no malodor, no active drainage, no erythema, chronic edema bilateral. No acute signs of infection.  Callus sub met 1 on left and Bilateral hallux and sub met 5 on left with no signs of infection  Nails x 10 are short and thickened consistent with oncyhomycosis   Vascular: Dorsalis Pedis pulse = 1/4 Bilateral,  Posterior Tibial pulse = 0/4 Bilateral,  Capillary Fill Time < 5 seconds, Lymphedema bilateral  Neurologic: Protective absent bilateral  Musculosketal: No Pain with palpation to ulcerated area on the right.  No pain with compression to calves bilateral. + Bunion, hammertoe, pes planus bilateral.  No results for input(s): GRAMSTAIN, LABORGA in the last 8760 hours.  Assessment and Plan:  Problem List Items Addressed This Visit    None    Visit Diagnoses    Pre-ulcerative calluses    -  Primary   Pain due to onychomycosis of toenails of both feet       PVD (peripheral vascular disease)  ([place])       Foot ulcer, right, limited to breakdown of skin ([place])       Diabetic polyneuropathy associated with type 2 diabetes mellitus ([place])       Lymphedema          -Examined patient and discussed the progression of the pinpoint ulceration and chronic edema -Mechanically dedbrided ulcerated callus and callus skin bilateral using a sterile chisel blade, no opening to right foot. Betadine applied to protect skin on the right cover with a Band-Aid and advised patient if she would benefit from lymphedema management with wound center as well as then helping to care for the right foot ulceration to prevent it from getting worse.  -Continue with offloading padding to shoes bilateral -At no additional charge mechanically debrided nails using sterile nail nipper without incident  -Patient to return to office in 6 weeks for wound/callus check or sooner if problems arise.  Landis Martins, DPM

## 2019-07-08 ENCOUNTER — Telehealth: Payer: Self-pay | Admitting: Sports Medicine

## 2019-07-08 DIAGNOSIS — L84 Corns and callosities: Secondary | ICD-10-CM

## 2019-07-08 DIAGNOSIS — E1142 Type 2 diabetes mellitus with diabetic polyneuropathy: Secondary | ICD-10-CM

## 2019-07-08 DIAGNOSIS — L97511 Non-pressure chronic ulcer of other part of right foot limited to breakdown of skin: Secondary | ICD-10-CM

## 2019-07-08 DIAGNOSIS — I739 Peripheral vascular disease, unspecified: Secondary | ICD-10-CM

## 2019-07-08 DIAGNOSIS — I890 Lymphedema, not elsewhere classified: Secondary | ICD-10-CM

## 2019-07-08 NOTE — Telephone Encounter (Signed)
Left message [facility] asking if they performed wound care as well as lymphedema care.

## 2019-07-08 NOTE — Telephone Encounter (Signed)
[facility] [place] states they do perform wound care as well as the lymphedema management and requested the orders for referral to be placed in each specialty, they work the [employer]. Faxed referrals to [facility].

## 2019-07-08 NOTE — Telephone Encounter (Signed)
Pt has called and stated that the wound center that she was referred to is to far and she does not have transportation that far wanted somewhere closer please assist

## 2019-07-09 ENCOUNTER — Ambulatory Visit (INDEPENDENT_AMBULATORY_CARE_PROVIDER_SITE_OTHER): Payer: Medicare Other

## 2019-07-09 ENCOUNTER — Telehealth: Payer: Self-pay

## 2019-07-09 ENCOUNTER — Other Ambulatory Visit: Payer: Self-pay

## 2019-07-09 DIAGNOSIS — E1122 Type 2 diabetes mellitus with diabetic chronic kidney disease: Secondary | ICD-10-CM

## 2019-07-09 DIAGNOSIS — I129 Hypertensive chronic kidney disease with stage 1 through stage 4 chronic kidney disease, or unspecified chronic kidney disease: Secondary | ICD-10-CM

## 2019-07-09 DIAGNOSIS — M25522 Pain in left elbow: Secondary | ICD-10-CM | POA: Diagnosis not present

## 2019-07-09 DIAGNOSIS — M150 Primary generalized (osteo)arthritis: Secondary | ICD-10-CM | POA: Diagnosis not present

## 2019-07-09 DIAGNOSIS — N183 Chronic kidney disease, stage 3 unspecified: Secondary | ICD-10-CM | POA: Diagnosis not present

## 2019-07-09 DIAGNOSIS — L97512 Non-pressure chronic ulcer of other part of right foot with fat layer exposed: Secondary | ICD-10-CM

## 2019-07-09 DIAGNOSIS — M25572 Pain in left ankle and joints of left foot: Secondary | ICD-10-CM | POA: Diagnosis not present

## 2019-07-09 DIAGNOSIS — M25512 Pain in left shoulder: Secondary | ICD-10-CM | POA: Diagnosis not present

## 2019-07-09 NOTE — Addendum Note (Signed)
Addended by: Alphia Kava D on: 07/09/2019 08:55 AM   Modules accepted: Orders

## 2019-07-09 NOTE — Telephone Encounter (Signed)
Ok thanks 

## 2019-07-09 NOTE — Telephone Encounter (Signed)
I informed pt of referral to [facility]. Pt thanked me.

## 2019-07-11 NOTE — Progress Notes (Addendum)
This visit occurred during the SARS-CoV-2 public health emergency.  Safety protocols were in place, including screening questions prior to the visit, additional usage of staff PPE, and extensive cleaning of exam room while observing appropriate contact time as indicated for disinfecting solutions.  Subjective:     Patient ID: Mary [name] , female    DOB: November 23, 1945 , 74 y.o.   MRN: [id number]   Chief Complaint  Patient presents with  . Edema    both legs    HPI  She is here today for f/u b/l LE edema. She reports her legs feel quite heavy. She has difficulty with ambulation. She tries to follow low salt diet. Denies orthopnea, but does not typically lie flat. She does have shortness of breath with ambulation. The LE swelling makes it difficult to ambulate without assistance. Additionally, due to knee issues- she is unable to ambulate without assistance.     Past Medical History:  Diagnosis Date  . Arthritis    osteoarthritis  . Cataract    OS  . Diabetes mellitus    Insulin dependent  . Diabetic retinopathy ([place])    NPDR OU  . Glaucoma    POAG OD  . H/O cardiovascular stress test    pt. reports 10/13- was normal per pt.   . Hyperlipemia   . Hypertension   . Hypertensive retinopathy    OU     Family History  Problem Relation Age of Onset  . Hypertension Mother   . Heart Problems Father   . Gout Father   . Arthritis Father      Current Outpatient Medications:  .  ACCU-CHEK AVIVA PLUS test strip, See admin instructions. for testing, Disp: , Rfl: 11 .  aspirin EC 81 MG tablet, Take 81 mg by mouth daily., Disp: , Rfl:  .  Assure Comfort Lancets 28G MISC, , Disp: , Rfl:  .  Blood Glucose Monitoring Suppl (ACCU-CHEK AVIVA PLUS) w/Device KIT, Use as directed to check blood sugars 2 times per day dx: e11.65, Disp: 1 kit, Rfl: 1 .  cetirizine (ZYRTEC) 10 MG tablet, TAKE 1 TABLET BY MOUTH EVERY DAY, Disp: 90 tablet, Rfl: 1 .  dorzolamide-timolol (COSOPT) 22.3-6.8 MG/ML  ophthalmic solution, Place 1 drop into the right eye 2 (two) times daily., Disp: , Rfl:  .  fluorometholone (FML) 0.1 % ophthalmic suspension, Place 1 drop into the right eye 3 (three) times daily., Disp: , Rfl:  .  furosemide (LASIX) 40 MG tablet, Take 1 tablet (40 mg total) by mouth daily., Disp: 90 tablet, Rfl: 1 .  HYDROcodone-acetaminophen (NORCO) 10-325 MG tablet, Take 1 tablet by mouth 5 (five) times daily as needed., Disp: , Rfl:  .  levothyroxine (SYNTHROID) 125 MCG tablet, Take 125 mcg by mouth daily before breakfast., Disp: , Rfl:  .  loratadine (CLARITIN) 10 MG tablet, Take 10 mg by mouth daily., Disp: , Rfl:  .  metoprolol tartrate (LOPRESSOR) 50 MG tablet, Take 50 mg by mouth 2 (two) times daily. , Disp: , Rfl:  .  omeprazole (PRILOSEC) 20 MG capsule, TAKE 1 CAPSULE BY MOUTH  DAILY, Disp: 90 capsule, Rfl: 3 .  pravastatin (PRAVACHOL) 40 MG tablet, Take 1 tablet (40 mg total) by mouth daily., Disp: 90 tablet, Rfl: 1 .  telmisartan (MICARDIS) 80 MG tablet, TAKE 1 TABLET BY MOUTH  DAILY, Disp: 90 tablet, Rfl: 3 .  fluticasone (FLONASE) 50 MCG/ACT nasal spray, SPRAY 1 SPRAY INTO EACH NOSTRIL EVERY DAY, Disp: 32 mL, Rfl:  1 .  KLOR-CON M20 20 MEQ tablet, Take 20 mEq by mouth daily. with food, Disp: , Rfl: 1 .  magnesium oxide (MAG-OX) 400 MG tablet, TAKE 1 TABLET BY MOUTH EVERY EVENING, Disp: 90 tablet, Rfl: 1 .  triamcinolone cream (KENALOG) 0.1 %, APPLY TO AFFECTED AREA TWICE A DAY, Disp: 30 g, Rfl: 0   No Known Allergies   Review of Systems  Constitutional: Negative.   Respiratory: Negative.   Cardiovascular: Positive for leg swelling.  Gastrointestinal: Negative.   Skin: Positive for rash.       She c/o rash on LE. No blisters noted.   Neurological: Negative.   Psychiatric/Behavioral: Negative.      Today's Vitals   06/16/19 1504  BP: 140/86  Pulse: 74  Temp: 98.4 F (36.9 C)  TempSrc: Oral  Height: 5' 7"  (1.702 m)  PainSc: 0-No pain   Body mass index is 46.02 kg/m.    Objective:  Physical Exam Vitals and nursing note reviewed.  Constitutional:      Appearance: Normal appearance.  HENT:     Head: Normocephalic and atraumatic.  Cardiovascular:     Rate and Rhythm: Normal rate and regular rhythm.     Heart sounds: Normal heart sounds.  Pulmonary:     Effort: Pulmonary effort is normal.     Breath sounds: Normal breath sounds.  Musculoskeletal:     Right lower leg: 1+ Edema present.     Left lower leg: 2+ Edema present.  Skin:    General: Skin is warm.     Findings: Rash present.     Comments: Slightly hyperpigmented rash on LLE. No erythema noted.   Neurological:     General: No focal deficit present.     Mental Status: She is alert.  Psychiatric:        Mood and Affect: Mood normal.        Behavior: Behavior normal.         Assessment And Plan:     1. Edema of left lower extremity  She agrees to venous u/s of LLE to r/o DVT. She also has edema of RLE. Encouraged to elevate legs when seated and to limit her salt intake.   - VAS [place] LOWER EXTREMITY VENOUS (DVT); Future  2. Hypothyroidism, unspecified type  I will check thyroid panel and adjust meds as needed.  Pt advised that if her thyroid function is suboptimally controlled, this may contribute to her LE edema.   - TSH - T4, Free  3. Parenchymal renal hypertension, stage 1 through stage 4 or unspecified chronic kidney disease  Chronic, fair control. She will continue with current meds. She is encouraged to avoid adding salt to her foods.  - CMP14+EGFR  4. Dermatitis  She was given rx triamcinolone cream to apply to affected area twice daily as needed.   ADDENDUM: 5. Gait abnormality  Multifactorial.  I will send her in a rx for wheelchair to help her with mobility.  Use of cane and walker have been insufficient in resolving this issue.  Patient will be able to self-propel herself in the home, and will have a caregiver that can provide assistance. I will also refer [facility] to her home. She may also benefit from [facility].    Maximino Greenland, MD    THE PATIENT IS ENCOURAGED TO PRACTICE SOCIAL DISTANCING DUE TO THE COVID-19 PANDEMIC.

## 2019-07-15 NOTE — Patient Instructions (Signed)
Visit Information  Goals Addressed      Patient Stated     "I would like to new power Scooter to get around better outside of my home" (pt-stated)        CARE PLAN ENTRY (see longitudinal plan of care for additional care plan information)  Current Barriers:   Knowledge Deficits related to determination for best DME to help improve mobility   Chronic Disease Management support and education needs related to Diabetes Mellitus with stage 3 Chronic Kidney disease, Parenchymal renal hypertension   Impaired Physical Mobility   Nurse Case Manager Clinical Goal(s):   Over the next 90 days, patient will verbalize understanding of plan for evaluation and determination for best DME to help improve patient's mobility, manual W/C, verses electric W/C, verses power Scooter   CCM RN CM Interventions:  07/12/19 call completed with patient   Inter-disciplinary care team collaboration (see longitudinal plan of care)  Evaluation of current treatment plan related to Impaired Physical Mobility and DME needs and patient's adherence to plan as established by provider.  Advised patient to discuss her DME needs with PCP at next OV in order for a face to face evaluation to be completed and PCP help make recommendations for best mechanical mobility option  Collaborated with PCP Dr. Dorothyann Peng regarding patient's request to be evaluated for mechanical DME such as an electric W/C and or power Scooter; Advised patient believes having a power Scooter for outdoor use is what she prefers   Discussed plans with patient for ongoing care management follow up and provided patient with direct contact information for care management team  Patient Self Care Activities:   Self administers medications as prescribed  Attends all scheduled provider appointments  Calls pharmacy for medication refills  Performs ADL's independently  Performs IADL's independently  Calls provider office for new concerns or  questions  Initial goal documentation       "to get the swelling down in my legs" (pt-stated)        CARE PLAN ENTRY (see longitudinal plan of care for additional care plan information)  Current Barriers:   Knowledge Deficits related to evaluation and treatment of bilateral lymphedema   Chronic Disease Management support and education needs related to Diabetes Mellitus with stage 3 Chronic Kidney disease, Parenchymal renal hypertension   Nurse Case Manager Clinical Goal(s):   Over the next 30 days, patient will complete her new patient appointment with the [facility] for evaluation and treatment of lymphedema to her lower extremities   Over the next 90 days, patient will work with the CCM team and PCP to address needs related to disease education and support to help improve Self Health management of Lymphedema  CCM RN CM Interventions:  07/12/19 call completed with patient   Inter-disciplinary care team collaboration (see longitudinal plan of care)  Evaluation of current treatment plan related to right foot ulceration and bilateral lower leg lymphedema and patient's adherence to plan as established by provider  Determined patient is established with Dr. Marylene Land DPM for care of her right foot ulcer. Her last OV completed on 07/06/19 is noted with the following Assessment/Plan:   Problem List Items Addressed This Visit    Visit Diagnoses       Pre-ulcerative calluses    -  Primary      Pain due to onychomycosis of toenails of both feet          PVD (peripheral vascular disease) (HCC)  Foot ulcer, right, limited to breakdown of skin (HCC)          Diabetic polyneuropathy associated with type 2 diabetes mellitus (HCC)          Lymphedema        -Examined patient and discussed the progression of the pinpoint ulceration and chronic edema  -Mechanically dedbrided ulcerated callus and callus skin bilateral using a sterile chisel blade, no  opening to right foot. Betadine applied to protect skin on the right cover with a Band-Aid and advised patient if she would benefit from lymphedema management with wound center as well as then helping to care for the right foot ulceration to prevent it from getting worse.   -Continue with offloading padding to shoes bilateral  -At no additional charge mechanically debrided nails using sterile nail nipper without incident   -Patient to return to office in 6 weeks for wound/callus check or sooner if problems arise  Determined patient will f/u at the [facility] on 07/27/19 for evaluation and treatment of lymphadema  Determined patient will use [employer] transportation to get her to her appointment   Discussed plans with patient for ongoing care management follow up and provided patient with direct contact information for care management team  Patient Self Care Activities:   Self administers medications as prescribed  Attends all scheduled provider appointments  Calls pharmacy for medication refills  Calls provider office for new concerns or questions  Initial goal documentation       "to improve my kidney function" (pt-stated)        CARE PLAN ENTRY (see longitudinal plan of care for additional care plan information)  Current Barriers:   Knowledge Deficits related to disease process and Self Health management of CKD  Chronic Disease Management support and education needs related to Diabetes Mellitus with stage 3 Chronic Kidney disease, Parenchymal renal hypertension   Nurse Case Manager Clinical Goal(s):   Over the next 90 days, patient will work with the CCM team and PCP to address needs related to disease education and support to improve Health Management of CKD   CCM RN CM Interventions:  07/12/19 call completed with patient   Inter-disciplinary care team collaboration (see longitudinal plan of care)  Evaluation of current treatment plan related to CKD and  patient's adherence to plan as established by provider.  Provided education to patient re: disease process including stages of CKD; Educated on patient's GFR and ways to improve Self Health Management of CKD  Discussed plans with patient for ongoing care management follow up and provided patient with direct contact information for care management team  Provided patient with printed educational materials related to Stages of Chronic Kidney Disease; 6 Ways to be Water Wise; [employer] Supplements for kidney patients   Patient Self Care Activities:   Self administers medications as prescribed  Attends all scheduled provider appointments  Calls pharmacy for medication refills  Performs ADL's independently  Performs IADL's independently  Calls provider office for new concerns or questions  Initial goal documentation       Other     COMPLETED: Assist with Chronic Care Management and Care Coordination needs        Current Barriers:   Knowledge Barriers related to resources and support available to address needs related to Chronic Care Management and Care Coordination needs  Case Manager Clinical Goal(s):   Over the next 30 days, patient will work with the CCM team to address needs related to Chronic disease  management and Care Coordination needs  Interventions:   Collaborated with BSW and initiated plan of care to address needs related to Chronic Care Management for DM and CKD III, Care Coordination for DME needs  Patient Self Care Activities:   Attends all scheduled provider appointments  Calls pharmacy for medication refills  Calls provider office for new concerns or questions  Initial goal documentation       Patient verbalizes understanding of instructions provided today.   Telephone follow up appointment with care management team member scheduled for: 07/20/19  Delsa Sale, RN, BSN, CCM Care Management Coordinator [facility]  Direct Phone: [phone]

## 2019-07-15 NOTE — Chronic Care Management (AMB) (Signed)
Chronic Care Management   Initial Visit Note  07/12/2019 Name: Mary [name] MRN: [id number] DOB: 12-Apr-1945  Referred by: Glendale Chard, MD Reason for referral : Chronic Care Management (RQ Initial RN CM Call )   Mary [name] is a 74 y.o. year old female who is a primary care patient of Glendale Chard, MD. The CCM team was consulted for assistance with chronic disease management and care coordination needs related to Diabetes Mellitus with stage 3 Chronic Kidney disease, Parenchymal renal hypertension.   Review of patient status, including review of consultants reports, relevant laboratory and other test results, and collaboration with appropriate care team members and the patient's provider was performed as part of comprehensive patient evaluation and provision of chronic care management services.    SDOH (Social Determinants of Health) assessments performed: Yes - no acute challenges  See Care Plan activities for detailed interventions related to [place] initial CCM RN CM outbound call to patient to assess for CCM needs, a care plan was established.     Medications: Outpatient Encounter Medications as of 07/09/2019  Medication Sig Note  . ACCU-CHEK AVIVA PLUS test strip See admin instructions. for testing   . aspirin EC 81 MG tablet Take 81 mg by mouth daily.   [name] 28G MISC    . Blood Glucose Monitoring Suppl (ACCU-CHEK AVIVA PLUS) w/Device KIT Use as directed to check blood sugars 2 times per day dx: e11.65   . cetirizine (ZYRTEC) 10 MG tablet TAKE 1 TABLET BY MOUTH EVERY DAY   . dorzolamide-timolol (COSOPT) 22.3-6.8 MG/ML ophthalmic solution Place 1 drop into the right eye 2 (two) times daily.   . fluorometholone (FML) 0.1 % ophthalmic suspension Place 1 drop into the right eye 3 (three) times daily.   . fluticasone (FLONASE) 50 MCG/ACT nasal spray SPRAY 1 SPRAY INTO EACH NOSTRIL EVERY DAY   . furosemide (LASIX) 40 MG tablet Take 1 tablet (40 mg  total) by mouth daily.   [name] HYDROcodone-acetaminophen (NORCO) 10-325 MG tablet Take 1 tablet by mouth 5 (five) times daily as needed.   [name] KLOR-CON M20 20 MEQ tablet Take 20 mEq by mouth daily. with food 02/23/2018: Patient instructed to hold this medication due to abnormal Potassium level of 6.2 on 02/18/18, per MD, Dr. Glendale Chard. Patient was given verbal education related to complications from hypo and hyperkalemia and importance of following MD recommendations. Patient instructed to resume this medication when directed by PCP. Patient verbalizes understanding.   [name] levothyroxine (SYNTHROID) 125 MCG tablet Take 125 mcg by mouth daily before breakfast.   . loratadine (CLARITIN) 10 MG tablet Take 10 mg by mouth daily.   . magnesium oxide (MAG-OX) 400 MG tablet TAKE 1 TABLET BY MOUTH EVERY EVENING   . metoprolol tartrate (LOPRESSOR) 50 MG tablet Take 50 mg by mouth 2 (two) times daily.    [name] omeprazole (PRILOSEC) 20 MG capsule TAKE 1 CAPSULE BY MOUTH  DAILY   . pravastatin (PRAVACHOL) 40 MG tablet Take 1 tablet (40 mg total) by mouth daily.   [name] telmisartan (MICARDIS) 80 MG tablet TAKE 1 TABLET BY MOUTH  DAILY   . triamcinolone cream (KENALOG) 0.1 % APPLY TO AFFECTED AREA TWICE A DAY    No facility-administered encounter medications on file as of 07/09/2019.     Objective:  Lab Results  Component Value Date   HGBA1C 5.0 04/06/2019   HGBA1C 5.1 06/24/2018   HGBA1C 4.9 02/18/2018   Lab Results  Component Value Date   MICROALBUR 30 11/18/2017   LDLCALC 65 11/16/2018   CREATININE 1.15 (H) 06/28/2019   BP Readings from Last 3 Encounters:  06/28/19 (!) 166/98  06/16/19 140/86  04/06/19 (!) 146/88    Goals Addressed      Patient Stated   .  "I would like to new power Scooter to get around better outside of my home" (pt-stated)        [place] (see longitudinal plan of care for additional care plan information)  Current Barriers:  [name] Knowledge Deficits related to determination for  best DME to help improve mobility  . Chronic Disease Management support and education needs related to Diabetes Mellitus with stage 3 Chronic Kidney disease, Parenchymal renal hypertension  . Impaired Physical Mobility   Nurse Case Manager Clinical Goal(s):  [name] Over the next 90 days, patient will verbalize understanding of plan for evaluation and determination for best DME to help improve patient's mobility, manual W/C, verses electric W/C, verses power Scooter   CCM RN CM Interventions:  07/12/19 call completed with patient  . Inter-disciplinary care team collaboration (see longitudinal plan of care) . Evaluation of current treatment plan related to Impaired Physical Mobility and DME needs and patient's adherence to plan as established by provider. . Advised patient to discuss her DME needs with PCP at next OV in order for a face to face evaluation to be completed and PCP help make recommendations for best mechanical mobility option . Collaborated with PCP Dr. Glendale Chard regarding patient's request to be evaluated for mechanical DME such as an electric W/C and or power Scooter; Advised patient believes having a power Scooter for outdoor use is what she prefers  . Discussed plans with patient for ongoing care management follow up and provided patient with direct contact information for care management team  Patient Self Care Activities:  . Self administers medications as prescribed . Attends all scheduled provider appointments . Calls pharmacy for medication refills . Performs ADL's independently . Performs IADL's independently . Calls provider office for new concerns or questions  Initial goal documentation     .  "to get the swelling down in my legs" (pt-stated)        CARE PLAN ENTRY (see longitudinal plan of care for additional care plan information)  Current Barriers:  [name] Knowledge Deficits related to evaluation and treatment of bilateral lymphedema  . Chronic Disease Management  support and education needs related to Diabetes Mellitus with stage 3 Chronic Kidney disease, Parenchymal renal hypertension   Nurse Case Manager Clinical Goal(s):  [name] Over the next 30 days, patient will complete her new patient appointment with the [facility] for evaluation and treatment of lymphedema to her lower extremities  . Over the next 90 days, patient will work with the CCM team and PCP to address needs related to disease education and support to help improve Self Health management of Lymphedema  CCM RN CM Interventions:  07/12/19 call completed with patient  . Inter-disciplinary care team collaboration (see longitudinal plan of care) . Evaluation of current treatment plan related to right foot ulceration and bilateral lower leg lymphedema and patient's adherence to plan as established by provider . Determined patient is established with Dr. Cannon Kettle DPM for care of her right foot ulcer. Her last OV completed on 07/06/19 is noted with the following Assessment/Plan:  . Problem List Items Addressed This Visit  .  Visit Diagnoses  .    [name]  Pre-ulcerative calluses    -  Primary .    [name] Pain due to onychomycosis of toenails of both feet     .    [name] PVD (peripheral vascular disease) ([place])     .    [name] Foot ulcer, right, limited to breakdown of skin ([place])     .    [name] Diabetic polyneuropathy associated with type 2 diabetes mellitus ([place])     .    . Lymphedema       . -Examined patient and discussed the progression of the pinpoint ulceration and chronic edema . -Mechanically dedbrided ulcerated callus and callus skin bilateral using a sterile chisel blade, no opening to right foot. Betadine applied to protect skin on the right cover with a Band-Aid and advised patient if she would benefit from lymphedema management with wound center as well as then helping to care for the right foot ulceration to prevent it from getting worse.  . -Continue with offloading padding to shoes  bilateral . -At no additional charge mechanically debrided nails using sterile nail nipper without incident  . -Patient to return to office in 6 weeks for wound/callus check or sooner if problems arise . Determined patient will f/u at the [facility] on 07/27/19 for evaluation and treatment of lymphadema . Determined patient will use [employer] transportation to get her to her appointment  . Discussed plans with patient for ongoing care management follow up and provided patient with direct contact information for care management team  Patient Self Care Activities:  . Self administers medications as prescribed . Attends all scheduled provider appointments . Calls pharmacy for medication refills . Calls provider office for new concerns or questions  Initial goal documentation     .  "to improve my kidney function" (pt-stated)        CARE PLAN ENTRY (see longitudinal plan of care for additional care plan information)  Current Barriers:  [name] Knowledge Deficits related to disease process and Self Health management of CKD . Chronic Disease Management support and education needs related to Diabetes Mellitus with stage 3 Chronic Kidney disease, Parenchymal renal hypertension   Nurse Case Manager Clinical Goal(s):  [name] Over the next 90 days, patient will work with the CCM team and PCP to address needs related to disease education and support to improve Health Management of CKD   CCM RN CM Interventions:  07/12/19 call completed with patient  . Inter-disciplinary care team collaboration (see longitudinal plan of care) . Evaluation of current treatment plan related to CKD and patient's adherence to plan as established by provider. . Provided education to patient re: disease process including stages of CKD; Educated on patient's GFR and ways to improve Self Health Management of CKD . Discussed plans with patient for ongoing care management follow up and provided patient with direct contact  information for care management team . Provided patient with printed educational materials related to Stages of Chronic Kidney Disease; 6 Ways to be Water Wise; [employer] for kidney patients   Patient Self Care Activities:  . Self administers medications as prescribed . Attends all scheduled provider appointments . Calls pharmacy for medication refills . Performs ADL's independently . Performs IADL's independently . Calls provider office for new concerns or questions  Initial goal documentation       Other   .  COMPLETED: Assist with Chronic Care Management and Care Coordination needs        Current Barriers:  [name] Knowledge  Barriers related to resources and support available to address needs related to Chronic Care Management and Care Coordination needs  Case Manager Clinical Goal(s):  [name] Over the next 30 days, patient will work with the CCM team to address needs related to Chronic disease management and Care Coordination needs  Interventions:  . Collaborated with BSW and initiated plan of care to address needs related to Chronic Care Management for DM and CKD III, Care Coordination for DME needs  Patient Self Care Activities:  . Attends all scheduled provider appointments . Calls pharmacy for medication refills . Calls provider office for new concerns or questions  Initial goal documentation        Plan:   Telephone follow up appointment with care management team member scheduled for: 07/20/19  Barb Merino, RN, BSN, CCM Care Management Coordinator [place] [facility]  Direct Phone: [phone]

## 2019-07-20 ENCOUNTER — Telehealth: Payer: Medicare Other

## 2019-07-21 ENCOUNTER — Telehealth: Payer: Medicare Other

## 2019-07-21 NOTE — Progress Notes (Signed)
[facility] Note  07/23/2019     CHIEF COMPLAINT Patient presents for Retina Follow Up   HISTORY OF PRESENT ILLNESS: Mary [name] is a 74 y.o. female who presents to the clinic today for:   HPI    Retina Follow Up    Patient presents with  CRVO/BRVO.  In both eyes.  This started months ago.  Severity is severe.  Duration of 6 weeks.  Since onset it is stable.  I, the attending physician,  performed the HPI with the patient and updated documentation appropriately.          Comments    74 y/o female pt here for 6 wk f/u for CRVO w/CME OU.  No change in [place] OU.  Denies pain, FOL, floaters.  Cosopt BID OD & Pred TID OD.  BS 103 this a.m.  A1C 5.1 3 mos ago.       Last edited by Bernarda Caffey, MD on 07/23/2019  1:16 PM. (History)    pt    Referring physician: Glendale Chard, [address] [place],  [place] [address]  HISTORICAL INFORMATION:   Selected notes from the [place] Transferred care from Dr. Zigmund Daniel   CURRENT MEDICATIONS: Current Outpatient Medications (Ophthalmic Drugs)  Medication Sig  . dorzolamide-timolol (COSOPT) 22.3-6.8 MG/ML ophthalmic solution Place 1 drop into the right eye 2 (two) times daily.  . fluorometholone (FML) 0.1 % ophthalmic suspension Place 1 drop into the right eye 3 (three) times daily.  . prednisoLONE acetate (PRED FORTE) 1 % ophthalmic suspension Place 1 drop into the right eye 3 (three) times daily.   No current facility-administered medications for this visit. (Ophthalmic Drugs)   Current Outpatient Medications (Other)  Medication Sig  . ACCU-CHEK AVIVA PLUS test strip See admin instructions. for testing  . aspirin EC 81 MG tablet Take 81 mg by mouth daily.  [name] 28G MISC   . Blood Glucose Monitoring Suppl (ACCU-CHEK AVIVA PLUS) w/Device KIT Use as directed to check blood sugars 2 times per day dx: e11.65  . cetirizine (ZYRTEC) 10 MG tablet TAKE 1 TABLET BY  MOUTH EVERY DAY  . fluticasone (FLONASE) 50 MCG/ACT nasal spray SPRAY 1 SPRAY INTO EACH NOSTRIL EVERY DAY  . furosemide (LASIX) 40 MG tablet Take 1 tablet (40 mg total) by mouth daily.  [name] HYDROcodone-acetaminophen (NORCO) 10-325 MG tablet Take 1 tablet by mouth 5 (five) times daily as needed.  [name] KLOR-CON M20 20 MEQ tablet Take 20 mEq by mouth daily. with food  . levothyroxine (SYNTHROID) 125 MCG tablet Take 125 mcg by mouth daily before breakfast.  . loratadine (CLARITIN) 10 MG tablet Take 10 mg by mouth daily.  . magnesium oxide (MAG-OX) 400 MG tablet TAKE 1 TABLET BY MOUTH EVERY EVENING  . metoprolol tartrate (LOPRESSOR) 50 MG tablet Take 50 mg by mouth 2 (two) times daily.   [name] omeprazole (PRILOSEC) 20 MG capsule TAKE 1 CAPSULE BY MOUTH  DAILY  . pravastatin (PRAVACHOL) 40 MG tablet Take 1 tablet (40 mg total) by mouth daily.  . predniSONE (DELTASONE) 10 MG tablet Take by mouth.  . telmisartan (MICARDIS) 80 MG tablet TAKE 1 TABLET BY MOUTH  DAILY  . triamcinolone cream (KENALOG) 0.1 % APPLY TO AFFECTED AREA TWICE A DAY   No current facility-administered medications for this visit. (Other)      REVIEW OF SYSTEMS: ROS    Positive for: Genitourinary, Musculoskeletal, Endocrine, Eyes  Negative for: Constitutional, Gastrointestinal, Neurological, Skin, HENT, Cardiovascular, Respiratory, Psychiatric, Allergic/Imm, Heme/Lymph   Last edited by Matthew Folks, COA on 07/23/2019  9:22 AM. (History)       ALLERGIES No Known Allergies  PAST MEDICAL HISTORY Past Medical History:  Diagnosis Date  . Arthritis    osteoarthritis  . Cataract    OS  . Diabetes mellitus    Insulin dependent  . Diabetic retinopathy ([place])    NPDR OU  . Glaucoma    POAG OD  . H/O cardiovascular stress test    pt. reports 10/13- was normal per pt.   . Hyperlipemia   . Hypertension   . Hypertensive retinopathy    OU   Past Surgical History:  Procedure Laterality Date  . CARPAL TUNNEL RELEASE Bilateral    . CATARACT EXTRACTION Right 10/05/2018   Dr. Kathlen Mody  . EYE SURGERY Right    Cat Sx  . KNEE ARTHROSCOPY     right  . QUADRICEPS TENDON REPAIR  02/17/2012   Procedure: REPAIR QUADRICEP TENDON;  Surgeon: Vickey Huger, MD;  Location: [facility];  Service: Orthopedics;  Laterality: Right;  right quadriceps repair with latera release  . QUADRICEPS TENDON REPAIR Right 05/15/2012   Procedure: REPAIR QUADRICEP TENDON;  Surgeon: Vickey Huger, MD;  Location: [facility];  Service: Orthopedics;  Laterality: Right;  . TOTAL KNEE ARTHROPLASTY  10/21/2011   rt tk  . TOTAL KNEE ARTHROPLASTY  10/21/2011   Procedure: TOTAL KNEE ARTHROPLASTY;  Surgeon: Rudean Haskell, MD;  Location: [facility];  Service: Orthopedics;  Laterality: Right;  . TUBAL LIGATION      FAMILY HISTORY Family History  Problem Relation Age of Onset  . Hypertension Mother   . Heart Problems Father   . Gout Father   . Arthritis Father     SOCIAL HISTORY Social History   Tobacco Use  . Smoking status: Former Smoker    Packs/day: 0.25    Years: 20.00    Pack years: 5.00    Types: Cigarettes    Quit date: 06/28/2014    Years since quitting: 5.0  . Smokeless tobacco: Never Used  Vaping Use  . Vaping Use: Never used  Substance Use Topics  . Alcohol use: No  . Drug use: Not Currently    Types: Oxycodone, Marijuana         OPHTHALMIC EXAM:  Base Eye Exam    Visual Acuity (Snellen - Linear)      Right Left   Dist cc 20/80 -2 HM   Dist ph cc NI NI   Correction: Glasses       Tonometry (Tonopen, 9:25 AM)      Right Left   Pressure 15 16       Pupils      Dark Light Shape React APD   Right 2 1 Round Minimal None   Left 2 1 Round Minimal None       Visual Fields (Counting fingers)      Left Right     Full   Restrictions Total superior temporal, inferior temporal, superior nasal, inferior nasal deficiencies        Extraocular Movement      Right Left    Full, Ortho Full, Ortho       Neuro/Psych    Oriented x3: Yes    Mood/Affect: Normal       Dilation    Both eyes: 1.0% Mydriacyl, 2.5% Phenylephrine @ 9:25 AM  Slit Lamp and Fundus Exam    Slit Lamp Exam      Right Left   Lids/Lashes Dermatochalasis - upper lid, mild Meibomian gland dysfunction Dermatochalasis - upper lid, mild Meibomian gland dysfunction   Conjunctiva/Sclera Melanosis Melanosis   Cornea 2-3+ Punctate epithelial erosions, well healed temporal cataract wounds 3+ Punctate epithelial erosions, well healed temporal cataract wounds   Anterior Chamber Deep and quiet Deep and quiet   Iris Round and moderately dilated to 5.51m Round and moderately dilated to 644m  Lens PC IOL in good position, trace, cortical remnant temporally, PC folds 2-3+ Nuclear sclerosis, 2-3+ Cortical cataract   Vitreous Vitreous syneresis, vitreous condensations, Ozurdex pellet remnants inferiorly, no residual IVTA Vitreous syneresis       Fundus Exam      Right Left   Disc  2-3+Pallor, Sharp rim, temporal PPA Mild Pallor, Sharp rim, PPP   C/D Ratio 0.5 0.3   Macula Blunted foveal reflex, [place], Retinal pigment epithelial mottling, diffuse edema -- slightly improved Flat, Blunted foveal reflex, RPE mottling and clumping, scarring, atrophy, no heme   Vessels Vascular attenuation, Tortuous Severe Vascular attenuation, +fibrosis   Periphery Attached    Attached, pavingstone and reticular degeneration inferiorly, scattered RPE atrophy          IMAGING AND PROCEDURES  Imaging and Procedures for _0 @  OCT, Retina - OU - Both Eyes       Right Eye Quality was good. Central Foveal Thickness: 320. Progression has improved. Findings include abnormal foveal contour, intraretinal fluid, intraretinal hyper-reflective material, no SRF, vitreomacular adhesion  (Interval improvement in IRF/CME).   Left Eye Quality was good. Central Foveal Thickness: 215. Progression has been stable. Findings include abnormal foveal contour, no IRF, no SRF, outer retinal  atrophy, epiretinal membrane, preretinal fibrosis, subretinal hyper-reflective material, pigment epithelial detachment (Stable from prior).   Notes *Images captured and stored on drive  Diagnosis / Impression:  OD: CRVO w/ interval improvement in IRF/CME OS: diffuse ORA - Stable from prior  Clinical management:  See below  Abbreviations: NFP - Normal foveal profile. CME - cystoid macular edema. PED - pigment epithelial detachment. IRF - intraretinal fluid. SRF - subretinal fluid. EZ - ellipsoid zone. ERM - epiretinal membrane. ORA - outer retinal atrophy. ORT - outer retinal tubulation. SRHM - subretinal hyper-reflective material        Intravitreal Injection, Pharmacologic Agent - OD - Right Eye       Time Out 07/23/2019. 10:05 AM. Confirmed correct patient, procedure, site, and patient consented.   Anesthesia Topical anesthesia was used. Anesthetic medications included Lidocaine 2%, Proparacaine 0.5%.   Procedure Preparation included 5% betadine to ocular surface, eyelid speculum. A 27 gauge needle was used.   Injection:  4 mg Triescence 4053mlL injection   NDC: 006684-060-3138ot: 10cw0, Expiration date: 09/14/2019   Route: Intravitreal, Site: Right Eye, Waste: 0.9 mL  Post-op Post injection exam found visual acuity of at least counting fingers. The patient tolerated the procedure well. There were no complications. The patient received written and verbal post procedure care education.   Notes An AC tap was performed following injection due to elevated IOP using a 30 gauge needle on a syringe with the plunger removed. The needle was placed at the limbus at 5 oclock and approximately 0.04 cc of aqueous was removed from the anterior chamber. Betadine was applied to the tap area before and after the paracentesis was performed. There were no complications. The patient tolerated the procedure well. The  IOP was rechecked and was found to be ~10 mmHg by tonopen.                  ASSESSMENT/PLAN:    ICD-10-CM   1. Central retinal vein occlusion with macular edema of both eyes  H34.8130 Intravitreal Injection, Pharmacologic Agent - OD - Right Eye    triamcinolone acetonide (TRIESENCE) 40 MG/ML subtenons injection 4 mg  2. Moderate nonproliferative diabetic retinopathy of both eyes with macular edema associated with type 2 diabetes mellitus ([place])  O87.8676   3. Retinal edema  H35.81 OCT, Retina - OU - Both Eyes  4. Essential hypertension  I10   5. Hypertensive retinopathy of both eyes  H35.033   6. Combined forms of age-related cataract of left eye  H25.812   7. Pseudophakia  Z96.1   8. Primary open angle glaucoma (POAG) of right eye, mild stage  H40.1111     1. CRVO w/ CME OU  - pt of Dr. Zigmund Daniel who wished to transfer care  - OS long-standing low vision (HM) with subfoveal scar and central retinal atrophy  - OD with CME actively being managed with Ozurdex OD ~q5 wks by Dr. Zigmund Daniel (last on 8.20.20) s/p multiple IVTA and Ozurdex OD  - was due for Ozurdex OD w/ Zigmund Daniel on 9.25.20, but underwent cataract surgery OD w/ Kathlen Mody on 9.21.20  - s/p IVTA OD #4 (10.26.20), #5 (12.10.20), #6 (01.21.21), #7 (03.05.21), #8 (04.16.21), #9 (05.28.21)  - today BCVA OD 20/80 (stable)  - exam shows no residual IVTA in vitreous cavity  - OCT shows diffuse macular edema improved from prior -- likely multifactorial -- CRVO, DME, post-cataract CME  - recommend IVTA OD #10 today, 07.09.21  - pt wishes to proceed  - RBA of procedure discussed, questions answered  - informed consent obtained and signed  - see procedure note -- AC tap  - f/u in 6 wks -- DFE/OCT/possible injection  2,3. Severe Non-proliferative diabetic retinopathy OU.  - OCT shows diabetic macular edema, OD   - recommend IVTA OD today, 05.28.21 as above for CRVO  4,5. Hypertensive retinopathy OU  - discussed importance of tight BP control  - monitor  6. Mixed form age related cataract OS  - under the  expert management of Dr. Kathlen Mody  7. Pseudophakia OD  - s/p CE/IOL OD (9.21.20) by expert surgeon, Dr. Kathlen Mody  - IOL in excellent position  - had post op IOP spike -- IOP now under better control  - monitor  8. POAG OD  - under the expert management of Dr. Kathlen Mody  - s/p SLT and goniotomy OD  - had post-cataract IOP spike  - IOP 15 today  Ophthalmic Meds Ordered this visit:  Meds ordered this encounter  Medications  . triamcinolone acetonide (TRIESENCE) 40 MG/ML subtenons injection 4 mg       Return in about 6 weeks (around 09/03/2019) for f/u CRVO OU, DFE, OCT.  There are no Patient Instructions on file for this visit.   Explained the diagnoses, plan, and follow up with the patient and they expressed understanding.  Patient expressed understanding of the importance of proper follow up care.   This document serves as a record of services personally performed by Gardiner Sleeper, MD, PhD. It was created on their behalf by San Jetty. Owens Shark, COT, an ophthalmic technician. The creation of this record is the provider's dictation and/or activities during the visit.    Electronically signed by: San Jetty. Owens Shark, COT 07.07.2021  1:24 PM   Gardiner Sleeper, M.D., Ph.D. Diseases & Surgery of the Retina and Vitreous [facility]  I have reviewed the above documentation for accuracy and completeness, and I agree with the above. Gardiner Sleeper, M.D., Ph.D. 07/23/19 1:24 PM   Abbreviations: M myopia (nearsighted); A astigmatism; H hyperopia (farsighted); P presbyopia; Mrx spectacle prescription;  CTL contact lenses; OD right eye; OS left eye; OU both eyes  XT exotropia; ET esotropia; PEK punctate epithelial keratitis; PEE punctate epithelial erosions; DES dry eye syndrome; MGD meibomian gland dysfunction; ATs artificial tears; PFAT's preservative free artificial tears; [place] nuclear sclerotic cataract; PSC posterior subcapsular cataract; ERM epi-retinal membrane; PVD posterior  vitreous detachment; RD retinal detachment; DM diabetes mellitus; DR diabetic retinopathy; NPDR non-proliferative diabetic retinopathy; PDR proliferative diabetic retinopathy; CSME clinically significant macular edema; DME diabetic macular edema; dbh dot blot hemorrhages; CWS cotton wool spot; POAG primary open angle glaucoma; C/D cup-to-disc ratio; HVF humphrey visual field; GVF goldmann visual field; OCT optical coherence tomography; IOP intraocular pressure; BRVO Branch retinal vein occlusion; CRVO central retinal vein occlusion; CRAO central retinal artery occlusion; BRAO branch retinal artery occlusion; RT retinal tear; SB scleral buckle; PPV pars plana vitrectomy; VH Vitreous hemorrhage; PRP panretinal laser photocoagulation; IVK intravitreal kenalog; VMT vitreomacular traction; MH Macular hole;  NVD neovascularization of the disc; NVE neovascularization elsewhere; AREDS age related eye disease study; ARMD age related macular degeneration; POAG primary open angle glaucoma; EBMD epithelial/anterior basement membrane dystrophy; ACIOL anterior chamber intraocular lens; IOL intraocular lens; PCIOL posterior chamber intraocular lens; Phaco/IOL phacoemulsification with intraocular lens placement; [place] photorefractive keratectomy; LASIK laser assisted in situ keratomileusis; HTN hypertension; DM diabetes mellitus; COPD chronic obstructive pulmonary disease

## 2019-07-23 ENCOUNTER — Ambulatory Visit (INDEPENDENT_AMBULATORY_CARE_PROVIDER_SITE_OTHER): Payer: Medicare Other | Admitting: Ophthalmology

## 2019-07-23 ENCOUNTER — Encounter (INDEPENDENT_AMBULATORY_CARE_PROVIDER_SITE_OTHER): Payer: Self-pay | Admitting: Ophthalmology

## 2019-07-23 ENCOUNTER — Other Ambulatory Visit: Payer: Self-pay

## 2019-07-23 DIAGNOSIS — H401111 Primary open-angle glaucoma, right eye, mild stage: Secondary | ICD-10-CM

## 2019-07-23 DIAGNOSIS — H3581 Retinal edema: Secondary | ICD-10-CM | POA: Diagnosis not present

## 2019-07-23 DIAGNOSIS — H25812 Combined forms of age-related cataract, left eye: Secondary | ICD-10-CM

## 2019-07-23 DIAGNOSIS — E113313 Type 2 diabetes mellitus with moderate nonproliferative diabetic retinopathy with macular edema, bilateral: Secondary | ICD-10-CM | POA: Diagnosis not present

## 2019-07-23 DIAGNOSIS — H34813 Central retinal vein occlusion, bilateral, with macular edema: Secondary | ICD-10-CM | POA: Diagnosis not present

## 2019-07-23 DIAGNOSIS — H35033 Hypertensive retinopathy, bilateral: Secondary | ICD-10-CM | POA: Diagnosis not present

## 2019-07-23 DIAGNOSIS — Z961 Presence of intraocular lens: Secondary | ICD-10-CM

## 2019-07-23 DIAGNOSIS — I10 Essential (primary) hypertension: Secondary | ICD-10-CM | POA: Diagnosis not present

## 2019-07-23 MED ORDER — TRIAMCINOLONE ACETONIDE 40 MG/ML IO SUSP
4.0000 mg | INTRAOCULAR | Status: AC | PRN
  Administered 2019-07-23: 4 mg via INTRAVITREAL

## 2019-07-27 ENCOUNTER — Encounter (HOSPITAL_BASED_OUTPATIENT_CLINIC_OR_DEPARTMENT_OTHER): Payer: Medicare Other | Attending: Internal Medicine | Admitting: Internal Medicine

## 2019-07-27 DIAGNOSIS — E1142 Type 2 diabetes mellitus with diabetic polyneuropathy: Secondary | ICD-10-CM | POA: Diagnosis not present

## 2019-07-27 DIAGNOSIS — L97528 Non-pressure chronic ulcer of other part of left foot with other specified severity: Secondary | ICD-10-CM | POA: Insufficient documentation

## 2019-07-27 DIAGNOSIS — L97518 Non-pressure chronic ulcer of other part of right foot with other specified severity: Secondary | ICD-10-CM | POA: Insufficient documentation

## 2019-07-27 DIAGNOSIS — E11621 Type 2 diabetes mellitus with foot ulcer: Secondary | ICD-10-CM | POA: Diagnosis not present

## 2019-07-27 DIAGNOSIS — I890 Lymphedema, not elsewhere classified: Secondary | ICD-10-CM | POA: Diagnosis not present

## 2019-07-27 NOTE — Progress Notes (Addendum)
SHAKEYA, KERKMAN ([id number]) Visit Report for 07/27/2019 Allergy List Details Patient Name: Date of Service: Mary [name] NDRA E. 07/27/2019 1:15 PM Medical Record Number: [id number] Patient Account Number: [id number] Date of Birth/Sex: Treating RN: 1946/01/12 (74 y.o. Tommye Standard Primary Care Garin Mata: Gwynneth Aliment Other Clinician: Referring Lavra Imler: Treating Karmella Bouvier/Extender: Knute Neu, Candyce Churn Weeks in Treatment: 0 Allergies Active Allergies No Known Allergies Allergy Notes Electronic Signature(s) Signed: 07/27/2019 5:34:53 PM By: Zenaida Deed RN, BSN Entered By: Zenaida Deed on 07/27/2019 13:47:29 -------------------------------------------------------------------------------- Arrival Information Details Patient Name: Date of Service: Mary [name], SA NDRA E. 07/27/2019 1:15 PM Medical Record Number: [id number] Patient Account Number: [id number] Date of Birth/Sex: Treating RN: Mar 21, 1945 (74 y.o. Tommye Standard Primary Care Gloris Shiroma: Gwynneth Aliment Other Clinician: Referring Joseandres Mazer: Treating Manraj Yeo/Extender: Shelda Pal in Treatment: 0 Visit Information Patient Arrived: Wheel Chair Arrival Time: 13:43 Accompanied By: self Transfer Assistance: None Patient Identification Verified: Yes Secondary Verification Process Completed: Yes Patient Requires Transmission-Based Precautions: No Patient Has Alerts: No Electronic Signature(s) Signed: 07/27/2019 5:34:53 PM By: Zenaida Deed RN, BSN Entered By: Zenaida Deed on 07/27/2019 13:47:06 -------------------------------------------------------------------------------- Clinic Level of Care Assessment Details Patient Name: Date of Service: Mary [name] NDRA E. 07/27/2019 1:15 PM Medical Record Number: [id number] Patient Account Number: [id number] Date of Birth/Sex: Treating RN: Feb 20, 1945 (74 y.o. Freddy Finner Primary Care Demari Gales: Gwynneth Aliment Other  Clinician: Referring Johnell Landowski: Treating Sieanna Vanstone/Extender: Shelda Pal in Treatment: 0 Clinic Level of Care Assessment Items TOOL 2 Quantity Score X- 1 0 Use when only an EandM is performed on the INITIAL visit ASSESSMENTS - Nursing Assessment / Reassessment X- 1 20 General Physical Exam (combine w/ comprehensive assessment (listed just below) when performed on new pt. evals) X- 1 25 Comprehensive Assessment (HX, ROS, Risk Assessments, Wounds Hx, etc.) ASSESSMENTS - Wound and Skin A ssessment / Reassessment []  - 0 Simple Wound Assessment / Reassessment - one wound []  - 0 Complex Wound Assessment / Reassessment - multiple wounds []  - 0 Dermatologic / Skin Assessment (not related to wound area) ASSESSMENTS - Ostomy and/or Continence Assessment and Care []  - 0 Incontinence Assessment and Management []  - 0 Ostomy Care Assessment and Management (repouching, etc.) PROCESS - Coordination of Care X - Simple Patient / Family Education for ongoing care 1 15 []  - 0 Complex (extensive) Patient / Family Education for ongoing care X- 1 10 Staff obtains , Records, T Results / Process Orders est []  - 0 Staff telephones HHA, Nursing Homes / Clarify orders / etc []  - 0 Routine Transfer to another Facility (non-emergent condition) []  - 0 Routine Hospital Admission (non-emergent condition) X- 1 15 New Admissions / / Ordering NPWT Apligraf, etc. , []  - 0 Emergency Hospital Admission (emergent condition) X- 1 10 Simple Discharge Coordination []  - 0 Complex (extensive) Discharge Coordination PROCESS - Special Needs []  - 0 Pediatric / Minor Patient Management []  - 0 Isolation Patient Management []  - 0 Hearing / Language / Visual special needs []  - 0 Assessment of Community assistance (transportation, D/C planning, etc.) []  - 0 Additional assistance / Altered mentation []  - 0 Support Surface(s) Assessment (bed, cushion,  seat, etc.) INTERVENTIONS - Wound Cleansing / Measurement []  - 0 Wound Imaging (photographs - any number of wounds) []  - 0 Wound Tracing (instead of photographs) []  - 0 Simple Wound Measurement - one wound []  - 0 Complex Wound Measurement - multiple wounds []  - 0  Simple Wound Cleansing - one wound []  - 0 Complex Wound Cleansing - multiple wounds INTERVENTIONS - Wound Dressings []  - 0 Small Wound Dressing one or multiple wounds []  - 0 Medium Wound Dressing one or multiple wounds []  - 0 Large Wound Dressing one or multiple wounds []  - 0 Application of Medications - injection INTERVENTIONS - Miscellaneous []  - 0 External ear exam []  - 0 Specimen Collection (cultures, biopsies, blood, body fluids, etc.) []  - 0 Specimen(s) / Culture(s) sent or taken to Lab for analysis []  - 0 Patient Transfer (multiple staff / Lift / Similar devices) []  - 0 Simple Staple / Suture removal (25 or less) []  - 0 Complex Staple / Suture removal (26 or more) []  - 0 Hypo / Hyperglycemic Management (close monitor of Blood Glucose) []  - 0 Ankle / Brachial Index (ABI) - do not check if billed separately Has the patient been seen at the hospital within the last three years: Yes Total Score: 95 Level Of Care: New/Established - Level 3 Electronic Signature(s) Signed: 08/06/2019 5:43:43 PM By: RN Entered By: on 08/02/2019 14:06:56 -------------------------------------------------------------------------------- Encounter Discharge Information Details Patient Name: Date of Service: , SA NDRA E. 07/27/2019 1:15 PM Medical Record Number: Patient Account Number: Date of Birth/Sex: Treating RN: 1945-09-27 (74 y.o. Primary Care Adiya Selmer: Other Clinician: Referring Jannifer Fischler: Treating Jarmarcus Wambold/Extender: in Treatment: 0 Encounter Discharge Information Items Discharge  Condition: Stable Ambulatory Status: Wheelchair Discharge Destination: Home Transportation: Other Accompanied By: self Schedule Follow-up Appointment: Yes Clinical Summary of Care: Patient Declined Electronic Signature(s) Signed: 07/27/2019 5:18:51 PM By: Yevonne Pax Entered By: Yevonne Pax on 07/27/2019 15:26:06 -------------------------------------------------------------------------------- Lower Extremity Assessment Details Patient Name: Date of Service: Mary [name], 07/29/2019 NDRA E. 07/27/2019 1:15 PM Medical Record Number: [id number] Patient Account Number: 14/07/1945 Date of Birth/Sex: Treating RN: [birth year] (74 y.o. Gwynneth Aliment Primary Care Kashena Novitski: Shelda Pal Other Clinician: Referring Taline Nass: Treating Urania Pearlman/Extender: 07/29/2019, Cherylin Mylar Weeks in Treatment: 0 Edema Assessment Assessed: [Left: No] [Right: No] Edema: [Left: Yes] [Right: Yes] Calf Left: Right: Point of Measurement: 44 cm From Medial Instep 50.8 cm 41.5 cm Ankle Left: Right: Point of Measurement: 13 cm From Medial Instep 42 cm 35.4 cm Vascular Assessment Pulses: Dorsalis Pedis Palpable: [Left:Yes] [Right:Yes] Electronic Signature(s) Signed: 07/27/2019 5:18:51 PM By: 07/29/2019 Signed: 07/27/2019 5:34:53 PM By: Janene Harvey RN, BSN Entered By: 07/29/2019 on 07/27/2019 15:09:59 -------------------------------------------------------------------------------- Multi-Disciplinary Care Plan Details Patient Name: Date of Service: [id number], SA NDRA E. 07/27/2019 1:15 PM Medical Record Number: [id number] Patient Account Number: Tommye Standard Date of Birth/Sex: Treating RN: 1945/12/25 (74 y.o. Candyce Churn Primary Care Ahnaf Caponi: 07/29/2019 Other Clinician: Referring Sofija Antwi: Treating Iwao Shamblin/Extender: Cherylin Mylar, 07/29/2019 in Treatment: 0 Active Inactive Electronic Signature(s) Signed: 07/27/2019 5:57:23 PM By: Cherylin Mylar  RN Entered By: 07/29/2019 on 07/27/2019 15:07:13 -------------------------------------------------------------------------------- Pain Assessment Details Patient Name: Date of Service: 07/29/2019 NDRA E. 07/27/2019 1:15 PM Medical Record Number: [id number] Patient Account Number: 14/07/1945 Date of Birth/Sex: Treating RN: 1945/02/18 (74 y.o. Gwynneth Aliment Primary Care Meghanne Pletz: Knute Neu Other Clinician: Referring Tajha Sammarco: Treating Infant Zink/Extender: Lanette Hampshire in Treatment: 0 Active Problems Location of Pain Severity and Description of Pain Patient Has Paino Yes Site Locations Pain Location: Pain Location: Generalized Pain Duration of the Pain. Constant / Intermittento Intermittent Rate the pain. Current Pain Level:  0 Worst Pain Level: 6 Least Pain Level: 0 Character of Pain Describe the Pain: Aching Pain Management and Medication Current Pain Management: Medication: Yes Rest: Yes Notes reports knee and leg pain with ambulation Electronic Signature(s) Signed: 07/27/2019 5:34:53 PM By: Zenaida Deed RN, BSN Entered By: Zenaida Deed on 07/27/2019 14:29:07 -------------------------------------------------------------------------------- Patient/Caregiver Education Details Patient Name: Date of Service: Mary [name] NDRA E. 7/13/2021andnbsp1:15 PM Medical Record Number: [id number] Patient Account Number: [id number] Date of Birth/Gender: Treating RN: 04-Mar-1945 (74 y.o. Freddy Finner Primary Care Physician: Gwynneth Aliment Other Clinician: Referring Physician: Treating Physician/Extender: Shelda Pal in Treatment: 0 Education Assessment Education Provided To: Patient Education Topics Provided Wound/Skin Impairment: Methods: Explain/Verbal Responses: State content correctly Electronic Signature(s) Signed: 07/27/2019 5:57:23 PM By: Yevonne Pax RN Entered By: Yevonne Pax on 07/27/2019  15:07:25 -------------------------------------------------------------------------------- Vitals Details Patient Name: Date of Service: Mary [name], SA NDRA E. 07/27/2019 1:15 PM Medical Record Number: [id number] Patient Account Number: [id number] Date of Birth/Sex: Treating RN: Sep 04, 1945 (74 y.o. Tommye Standard Primary Care Ziomara Birenbaum: Gwynneth Aliment Other Clinician: Referring Rosaleen Mazer: Treating Wellington Winegarden/Extender: Shelda Pal in Treatment: 0 Vital Signs Time Taken: 14:04 Temperature (F): 98.1 Height (in): 67 Pulse (bpm): 77 Source: Stated Respiratory Rate (breaths/min): 20 Weight (lbs): 290 Blood Pressure (mmHg): 181/66 Source: Stated Capillary Blood Glucose (mg/dl): 267 Body Mass Index (BMI): 45.4 Reference Range: 80 - 120 mg / dl Notes glucose per pt report a few days ago Electronic Signature(s) Signed: 07/27/2019 5:34:53 PM By: Zenaida Deed RN, BSN Entered By: Zenaida Deed on 07/27/2019 14:07:05

## 2019-07-27 NOTE — Progress Notes (Addendum)
FARREN, LANDA ([id number]) Visit Report for 07/27/2019 Chief Complaint Document Details Patient Name: Date of Service: Mary [name] 07/27/2019 1:15 PM Medical Record Number: [id number] Patient Account Number: [id number] Date of Birth/Sex: Treating RN: 1945-04-06 (74 y.o. Orvan Falconer Primary Care Provider: Maximino Greenland Other Clinician: Referring Provider: Treating Provider/Extender: Varney Baas in Treatment: 0 Information Obtained from: Patient Chief Complaint 07/27/2019; this is a patient who has had bilateral foot wounds cared for by Dr. Cannon Kettle of podiatry. Apparently referred here for management of her lower extremity edema Electronic Signature(s) Signed: 07/27/2019 5:16:39 PM By: Linton Ham MD Entered By: Linton Ham on 07/27/2019 15:14:42 -------------------------------------------------------------------------------- HPI Details Patient Name: Date of Service: Mary [name], Mary [name] 07/27/2019 1:15 PM Medical Record Number: [id number] Patient Account Number: [id number] Date of Birth/Sex: Treating RN: 10/29/1945 (74 y.o. Orvan Falconer Primary Care Provider: Maximino Greenland Other Clinician: Referring Provider: Treating Provider/Extender: Varney Baas in Treatment: 0 History of Present Illness HPI Description: ADMISSION 07/27/2019 This is a 74 year old woman with type 2 diabetes and recent treatment in podiatry for diabetic foot wounds on the bilateral first metatarsal head. She had dry Cracked skin with callus. Pes planus deformity. It was noted that she has had lower extremity edema. She had a DVT rule out study in 06/23/2019 that was negative. She has been sent here apparently for review of the edema. Past medical history includes bilateral lower extremity edema, central retinal vein occlusion, type 2 diabetes on no current treatment with a very stable hemoglobin A1c, history of right foot ulcer treated  by Dr. Cannon Kettle of podiatry, right total knee replacement. She has a lot of pain in her walking likely secondary to osteoarthritis We could not do ABIs in this clinic Electronic Signature(s) Signed: 07/27/2019 5:16:39 PM By: Linton Ham MD Entered By: Linton Ham on 07/27/2019 15:17:09 -------------------------------------------------------------------------------- Physical Exam Details Patient Name: Date of Service: Mary [name], Mary [name] 07/27/2019 1:15 PM Medical Record Number: [id number] Patient Account Number: [id number] Date of Birth/Sex: Treating RN: Jan 29, 1945 (74 y.o. Orvan Falconer Primary Care Provider: Maximino Greenland Other Clinician: Referring Provider: Treating Provider/Extender: Varney Baas in Treatment: 0 Constitutional Patient is hypertensive.. Pulse regular and within target range for patient.[name] Respirations regular, non-labored and within target range.. Temperature is normal and within the target range for the patient.[name] Appears in no distress. Cardiovascular Pedal pulses are palpable her feet are warm. She has bilateral lower extremity edema with a pantaloon deformity above the ankle. This is nonpitting.. Notes Wound exam; she has no open wounds dry callus over the first metatarsal heads bilaterally I did not debride this. Electronic Signature(s) Signed: 07/27/2019 5:16:39 PM By: Linton Ham MD Entered By: Linton Ham on 07/27/2019 15:18:30 -------------------------------------------------------------------------------- Physician Orders Details Patient Name: Date of Service: Mary [name], Mary [place] 07/27/2019 1:15 PM Medical Record Number: [id number] Patient Account Number: [id number] Date of Birth/Sex: Treating RN: 1945-10-16 (74 y.o. Orvan Falconer Primary Care Provider: Maximino Greenland Other Clinician: Referring Provider: Treating Provider/Extender: Varney Baas in Treatment: 0 Verbal / Phone  Orders: No Diagnosis Coding Discharge From [facility] Services Discharge from [place] - pt to wear compression jeltalites on in the am off in the pm Electronic Signature(s) Signed: 07/27/2019 5:16:39 PM By: Linton Ham MD Signed: 07/27/2019 5:57:23 PM By: Carlene Coria RN Entered By: Carlene Coria on 07/27/2019 15:06:46 -------------------------------------------------------------------------------- Problem List Details Patient Name: Date  of Service: Mary [place] 07/27/2019 1:15 PM Medical Record Number: [id number] Patient Account Number: [id number] Date of Birth/Sex: Treating RN: 07-08-1945 (74 y.o. Orvan Falconer Primary Care Provider: Maximino Greenland Other Clinician: Referring Provider: Treating Provider/Extender: Varney Baas in Treatment: 0 Active Problems ICD-10 Encounter Code Description Active Date MDM Code Description Active Date MDM Diagnosis I89.0 Lymphedema, not elsewhere classified 07/27/2019 No Yes E11.621 Type 2 diabetes mellitus with foot ulcer 07/27/2019 No Yes L97.518 Non-pressure chronic ulcer of other part of right foot with other specified 07/27/2019 No Yes severity L97.528 Non-pressure chronic ulcer of other part of left foot with other specified 07/27/2019 No Yes severity E11.42 Type 2 diabetes mellitus with diabetic polyneuropathy 07/27/2019 No Yes Inactive Problems Resolved Problems Electronic Signature(s) Signed: 07/27/2019 5:16:39 PM By: Linton Ham MD Entered By: Linton Ham on 07/27/2019 15:12:29 -------------------------------------------------------------------------------- Progress Note Details Patient Name: Date of Service: Mary [name], Mary [name] 07/27/2019 1:15 PM Medical Record Number: [id number] Patient Account Number: [id number] Date of Birth/Sex: Treating RN: 1945-07-27 (74 y.o. Orvan Falconer Primary Care Provider: Maximino Greenland Other Clinician: Referring Provider: Treating Provider/Extender:  Varney Baas in Treatment: 0 Subjective Chief Complaint Information obtained from Patient 07/27/2019; this is a patient who has had bilateral foot wounds cared for by Dr. Cannon Kettle of podiatry. Apparently referred here for management of her lower extremity edema History of Present Illness (HPI) ADMISSION 07/27/2019 This is a 74 year old woman with type 2 diabetes and recent treatment in podiatry for diabetic foot wounds on the bilateral first metatarsal head. She had dry Cracked skin with callus. Pes planus deformity. It was noted that she has had lower extremity edema. She had a DVT rule out study in 06/23/2019 that was negative. She has been sent here apparently for review of the edema. Past medical history includes bilateral lower extremity edema, central retinal vein occlusion, type 2 diabetes on no current treatment with a very stable hemoglobin A1c, history of right foot ulcer treated by Dr. Cannon Kettle of podiatry, right total knee replacement. She has a lot of pain in her walking likely secondary to osteoarthritis We could not do ABIs in this clinic Patient History Information obtained from Patient. Allergies No Known Allergies Family History Diabetes - Siblings,Child, Heart Disease - Father, Hypertension - Mother,Siblings, Stroke - Mother, Thyroid Problems - Child, No family history of Cancer, Hereditary Spherocytosis, Kidney Disease, Lung Disease, Seizures, Tuberculosis. Social History Former smoker - quit 7 yrs ago, Marital Status - Single, Alcohol Use - Never, Drug Use - No History, Caffeine Use - Daily - coffee tea. Medical History Eyes Patient has history of Cataracts - removed from right eye Denies history of Glaucoma, Optic Neuritis Ear/Nose/Mouth/Throat Denies history of Chronic sinus problems/congestion, Middle ear problems Hematologic/Lymphatic Patient has history of Lymphedema Cardiovascular Patient has history of Hypertension Endocrine Patient  has history of Type II Diabetes Denies history of Type I Diabetes Integumentary (Skin) Denies history of History of Burn Musculoskeletal Patient has history of Osteoarthritis Neurologic Patient has history of Neuropathy - hx Psychiatric Denies history of Anorexia/bulimia, Confinement Anxiety Patient is treated with Controlled Diet. Blood sugar is not tested. Hospitalization/Surgery History - total knee replacement. - right cataract removed. - right quadracep tendon repair. - bil carpal tunnel release. - tubal ligation. Medical A Surgical History Notes nd Constitutional Symptoms (General Health) morbid obesity Eyes blind left eye, hemorrhagic retinopathy Cardiovascular hyperlipidemia Endocrine hypothyroidism Genitourinary parenchymal renal hypertension, CKD stage 3 Review of Systems (  ROS) Constitutional Symptoms (General Health) Denies complaints or symptoms of Fatigue, Fever, Chills, Marked Weight Change. Eyes Complains or has symptoms of Glasses / Contacts. Denies complaints or symptoms of Dry Eyes, Vision Changes. Ear/Nose/Mouth/Throat Complains or has symptoms of Chronic sinus problems or rhinitis. Respiratory Denies complaints or symptoms of Chronic or frequent coughs, Shortness of Breath. Cardiovascular Denies complaints or symptoms of Chest pain. Gastrointestinal Denies complaints or symptoms of Frequent diarrhea, Nausea, Vomiting. Endocrine Denies complaints or symptoms of Heat/cold intolerance. Genitourinary Denies complaints or symptoms of Frequent urination. Integumentary (Skin) Denies complaints or symptoms of Wounds. Musculoskeletal Complains or has symptoms of Muscle Weakness. Neurologic Denies complaints or symptoms of Numbness/parasthesias. Psychiatric Denies complaints or symptoms of Claustrophobia, Suicidal. Objective Constitutional Patient is hypertensive.. Pulse regular and within target range for patient.[name] Respirations regular, non-labored and  within target range.. Temperature is normal and within the target range for the patient.[name] Appears in no distress. Vitals Time Taken: 2:04 PM, Height: 67 in, Source: Stated, Weight: 290 lbs, Source: Stated, BMI: 45.4, Temperature: 98.1 F, Pulse: 77 bpm, Respiratory Rate: 20 breaths/min, Blood Pressure: 181/66 mmHg, Capillary Blood Glucose: 103 mg/dl. General Notes: glucose per pt report a few days ago Cardiovascular Pedal pulses are palpable her feet are warm. She has bilateral lower extremity edema with a pantaloon deformity above the ankle. This is nonpitting.. General Notes: Wound exam; she has no open wounds dry callus over the first metatarsal heads bilaterally I did not debride this. Assessment Active Problems ICD-10 Lymphedema, not elsewhere classified Type 2 diabetes mellitus with foot ulcer Non-pressure chronic ulcer of other part of right foot with other specified severity Non-pressure chronic ulcer of other part of left foot with other specified severity Type 2 diabetes mellitus with diabetic polyneuropathy Plan Discharge From [name] Services: Discharge from [place] - pt to wear compression jeltalites on in the am off in the pm 1. I think this patient has lymphedema she has not had any open wounds or really serious consequences of this. 2. I do not think she would be mobile enough to put on standard over the toes stockings. With her permission we ordered her bilateral juxta lite stockings or external compression stockings. 3. She has callus on both her first met heads diabetic peripheral neuropathy with absent vibration and microfilament testing. She is going to be at risk for skin breakdown in her bilateral feet 4. She does not need to be followed here at this point I do not think she needs a lymphedema clinic Electronic Signature(s) Signed: 07/27/2019 5:16:39 PM By: Linton Ham MD Entered By: Linton Ham on 07/27/2019  15:19:40 -------------------------------------------------------------------------------- HxROS Details Patient Name: Date of Service: Mary [name], Mary [name] 07/27/2019 1:15 PM Medical Record Number: [id number] Patient Account Number: [id number] Date of Birth/Sex: Treating RN: 26-Jun-1945 (74 y.o. Elam Dutch Primary Care Provider: Maximino Greenland Other Clinician: Referring Provider: Treating Provider/Extender: Varney Baas in Treatment: 0 Information Obtained From Patient Constitutional Symptoms (General Health) Complaints and Symptoms: Negative for: Fatigue; Fever; Chills; Marked Weight Change Medical History: Past Medical History Notes: morbid obesity Eyes Complaints and Symptoms: Positive for: Glasses / Contacts Negative for: Dry Eyes; Vision Changes Medical History: Positive for: Cataracts - removed from right eye Negative for: Glaucoma; Optic Neuritis Past Medical History Notes: blind left eye, hemorrhagic retinopathy Ear/Nose/Mouth/Throat Complaints and Symptoms: Positive for: Chronic sinus problems or rhinitis Medical History: Negative for: Chronic sinus problems/congestion; Middle ear problems Respiratory Complaints and Symptoms: Negative for: Chronic or frequent  coughs; Shortness of Breath Cardiovascular Complaints and Symptoms: Negative for: Chest pain Medical History: Positive for: Hypertension Past Medical History Notes: hyperlipidemia Gastrointestinal Complaints and Symptoms: Negative for: Frequent diarrhea; Nausea; Vomiting Endocrine Complaints and Symptoms: Negative for: Heat/cold intolerance Medical History: Positive for: Type II Diabetes Negative for: Type I Diabetes Past Medical History Notes: hypothyroidism Time with diabetes: 78 yrs Treated with: Diet Blood sugar tested every day: No Genitourinary Complaints and Symptoms: Negative for: Frequent urination Medical History: Past Medical History  Notes: parenchymal renal hypertension, CKD stage 3 Integumentary (Skin) Complaints and Symptoms: Negative for: Wounds Medical History: Negative for: History of Burn Musculoskeletal Complaints and Symptoms: Positive for: Muscle Weakness Medical History: Positive for: Osteoarthritis Neurologic Complaints and Symptoms: Negative for: Numbness/parasthesias Medical History: Positive for: Neuropathy - hx Psychiatric Complaints and Symptoms: Negative for: Claustrophobia; Suicidal Medical History: Negative for: Anorexia/bulimia; Confinement Anxiety Hematologic/Lymphatic Medical History: Positive for: Lymphedema Immunological Oncologic HBO Extended History Items Eyes: Cataracts Immunizations Pneumococcal Vaccine: Received Pneumococcal Vaccination: Yes Implantable Devices Yes Hospitalization / Surgery History Type of Hospitalization/Surgery total knee replacement right cataract removed right quadracep tendon repair bil carpal tunnel release tubal ligation Family and Social History Cancer: No; Diabetes: Yes - Siblings,Child; Heart Disease: Yes - Father; Hereditary Spherocytosis: No; Hypertension: Yes - Mother,Siblings; Kidney Disease: No; Lung Disease: No; Seizures: No; Stroke: Yes - Mother; Thyroid Problems: Yes - Child; Tuberculosis: No; Former smoker - quit 7 yrs ago; Marital Status - Single; Alcohol Use: Never; Drug Use: No History; Caffeine Use: Daily - coffee tea; Financial Concerns: No; Food, Clothing or Shelter Needs: No; Support System Lacking: No; Transportation Concerns: No Electronic Signature(s) Signed: 07/27/2019 5:16:39 PM By: Linton Ham MD Signed: 07/27/2019 5:34:53 PM By: [name] RN, BSN Entered By: [name] on 07/27/2019 14:20:41 -------------------------------------------------------------------------------- SuperBill Details Patient Name: Date of Service: Mary [name], Mary Hans[place] E. 07/27/2019 Medical Record Number: [id number] Patient Account  Number: [id number] Date of Birth/Sex: Treating RN: 09-12-1945 (74 y.o. Orvan Falconer Primary Care Provider: Maximino Greenland Other Clinician: Referring Provider: Treating Provider/Extender: Varney Baas in Treatment: 0 Diagnosis Coding ICD-10 Codes Code Description I89.0 Lymphedema, not elsewhere classified E11.621 Type 2 diabetes mellitus with foot ulcer L97.518 Non-pressure chronic ulcer of other part of right foot with other specified severity L97.528 Non-pressure chronic ulcer of other part of left foot with other specified severity E11.42 Type 2 diabetes mellitus with diabetic polyneuropathy Facility Procedures CPT4 Code: [id number] Description: 99213 - WOUND CARE VISIT-LEV 3 EST PT Modifier: Quantity: 1 Physician Procedures : CPT4 Code Description Modifier [id number] - WC PHYS LEVEL 2 - NEW PT ICD-10 Diagnosis Description I89.0 Lymphedema, not elsewhere classified E11.621 Type 2 diabetes mellitus with foot ulcer L97.518 Non-pressure chronic ulcer of other part of right  foot with other specified severity L97.528 Non-pressure chronic ulcer of other part of left foot with other specified severity Quantity: 1 Electronic Signature(s) Signed: 08/03/2019 6:00:01 PM By: Linton Ham MD Signed: 08/06/2019 5:43:43 PM By: Carlene Coria RN Previous Signature: 07/27/2019 5:16:39 PM Version By: Linton Ham MD Entered By: Carlene Coria on 08/02/2019 14:07:13

## 2019-07-27 NOTE — Progress Notes (Addendum)
Mary [name], Mary [name] ([id number]) Visit Report for 07/27/2019 Abuse/Suicide Risk Screen Details Patient Name: Date of Service: Mary [name] Mary E. 07/27/2019 1:15 PM Medical Record Number: [id number] Patient Account Number: [id number] Date of Birth/Sex: Treating RN: February 23, 1945 (74 y.o. Mary [name] Primary Care Mary [name]: Mary [name] Other Clinician: Referring Mary [name]: Treating Mary [name]/Extender: Mary [name] in Treatment: 0 Abuse/Suicide Risk Screen Items Answer ABUSE RISK SCREEN: Has anyone close to you tried to hurt or harm you recentlyo No Do you feel uncomfortable with anyone in your familyo No Has anyone forced you do things that you didnt want to doo No Electronic Signature(s) Signed: 07/27/2019 5:34:53 PM By: Mary Deed RN, BSN Entered By: Mary [name] on 07/27/2019 14:21:08 -------------------------------------------------------------------------------- Activities of Daily Living Details Patient Name: Date of Service: Mary [name] Mary E. 07/27/2019 1:15 PM Medical Record Number: [id number] Patient Account Number: [id number] Date of Birth/Sex: Treating RN: 09-Feb-1945 (74 y.o. Mary [name] Primary Care Mary [name]: Mary [name] Other Clinician: Referring Mary [name]: Treating Caspar Favila/Extender: Mary [name] in Treatment: 0 Activities of Daily Living Items Answer Activities of Daily Living (Please select one for each item) Drive Automobile Not Able T Medications ake Completely Able Use T elephone Completely Able Care for Appearance Completely Able Use T oilet Completely Able Bath / Shower Completely Able Dress Self Completely Able Feed Self Completely Able Walk Need Assistance Get In / Out Bed Completely Able Housework Completely Able Prepare Meals Completely Able Handle Money Completely Able Shop for Self Need Assistance Electronic Signature(s) Signed: 07/27/2019 5:34:53 PM By: Mary Deed  RN, BSN Entered By: Mary [name] on 07/27/2019 14:21:55 -------------------------------------------------------------------------------- Education Screening Details Patient Name: Date of Service: Mary [name], Mary [name] Mary E. 07/27/2019 1:15 PM Medical Record Number: [id number] Patient Account Number: [id number] Date of Birth/Sex: Treating RN: 1945-04-23 (74 y.o. Mary [name] Primary Care Mary [name]: Mary [name] Other Clinician: Referring Mary [name]: Treating Mary [name]/Extender: Mary [name] in Treatment: 0 Primary Learner Assessed: Patient Learning Preferences/Education Level/Primary Language Learning Preference: Explanation, Demonstration, Printed Material Highest Education Level: High School Preferred Language: English Cognitive Barrier Language Barrier: No Translator Needed: No Memory Deficit: No Emotional Barrier: No Cultural/Religious Beliefs Affecting Medical Care: No Physical Barrier Impaired Vision: Yes blind left eye Impaired Hearing: No Decreased Hand dexterity: No Knowledge/Comprehension Knowledge Level: High Comprehension Level: High Ability to understand written instructions: High Ability to understand verbal instructions: High Motivation Anxiety Level: Calm Cooperation: Cooperative Education Importance: Acknowledges Need Interest in Health Problems: Asks Questions Perception: Coherent Willingness to Engage in Self-Management High Activities: Readiness to Engage in Self-Management High Activities: Electronic Signature(s) Signed: 07/27/2019 5:34:53 PM By: Mary Deed RN, BSN Entered By: Mary [name] on 07/27/2019 14:22:34 -------------------------------------------------------------------------------- Fall Risk Assessment Details Patient Name: Date of Service: Mary [name], Mary [name] Mary E. 07/27/2019 1:15 PM Medical Record Number: [id number] Patient Account Number: [id number] Date of Birth/Sex: Treating RN: 17-Jan-1945 (74 y.o. Mary [name] Primary Care Laprecious Austill: Mary [name] Other Clinician: Referring Farran Amsden: Treating Cadden Elizondo/Extender: Mary [name] in Treatment: 0 Fall Risk Assessment Items Have you had 2 or more falls in the last 12 monthso 0 No Have you had any fall that resulted in injury in the last 12 monthso 0 No FALLS RISK SCREEN History of falling - immediate or within 3 months 0 No Secondary diagnosis (Do you have 2 or more medical diagnoseso) 0 No Ambulatory aid None/bed rest/wheelchair/nurse 0 No Crutches/cane/walker 15 Yes Furniture 0 No Intravenous therapy  Access/Saline/Heparin Lock 0 No Gait/Transferring Normal/ bed rest/ wheelchair 0 No Weak (short steps with or without shuffle, stooped but able to lift head while walking, may seek 10 Yes support from furniture) Impaired (short steps with shuffle, may have difficulty arising from chair, head down, impaired 0 No balance) Mental Status Oriented to own ability 0 Yes Electronic Signature(s) Signed: 07/27/2019 5:34:53 PM By: Mary Deed RN, BSN Entered By: Mary [name] on 07/27/2019 14:23:17 -------------------------------------------------------------------------------- Foot Assessment Details Patient Name: Date of Service: Mary [name], Mary [name] Mary E. 07/27/2019 1:15 PM Medical Record Number: [id number] Patient Account Number: [id number] Date of Birth/Sex: Treating RN: 08-31-1945 (74 y.o. Mary [name] Primary Care Alexina Niccoli: Mary [name] Other Clinician: Referring Tracy Gerken: Treating Meila Berke/Extender: Mary [name] in Treatment: 0 Foot Assessment Items Site Locations + = Sensation present, - = Sensation absent, C = Callus, U = Ulcer R = Redness, W = Warmth, M = Maceration, PU = Pre-ulcerative lesion F = Fissure, S = Swelling, D = Dryness Assessment Right: Left: Other Deformity: No No Prior Foot Ulcer: Yes No Prior Amputation: No No Charcot Joint: No  No Ambulatory Status: Ambulatory With Help Assistance Device: Walker Gait: Steady Electronic Signature(s) Signed: 07/27/2019 5:34:53 PM By: Mary Deed RN, BSN Entered By: Mary [name] on 07/27/2019 14:26:13 -------------------------------------------------------------------------------- Nutrition Risk Screening Details Patient Name: Date of Service: Mary [name], Janene Harvey Mary E. 07/27/2019 1:15 PM Medical Record Number: [id number] Patient Account Number: [id number] Date of Birth/Sex: Treating RN: 1945-03-01 (74 y.o. Mary [name] Primary Care Meral Geissinger: Mary [name] Other Clinician: Referring Carylon Tamburro: Treating Tujuana Kilmartin/Extender: Knute Neu, Lanette Hampshire in Treatment: 0 Height (in): 67 Weight (lbs): 290 Body Mass Index (BMI): 45.4 Nutrition Risk Screening Items Score Screening NUTRITION RISK SCREEN: I have an illness or condition that made me change the kind and/or amount of food I eat 0 No I eat fewer than two meals per day 0 No I eat few fruits and vegetables, or milk products 0 No I have three or more drinks of beer, liquor or wine almost every day 0 No I have tooth or mouth problems that make it hard for me to eat 0 No I don't always have enough money to buy the food I need 0 No I eat alone most of the time 1 Yes I take three or more different prescribed or over-the-counter drugs a day 1 Yes Without wanting to, I have lost or gained 10 pounds in the last six months 0 No I am not always physically able to shop, cook and/or feed myself 0 No Nutrition Protocols Good Risk Protocol 0 No interventions needed Moderate Risk Protocol High Risk Proctocol Risk Level: Good Risk Score: 2 Electronic Signature(s) Signed: 07/27/2019 5:34:53 PM By: Mary Deed RN, BSN Entered By: Mary [name] on 07/27/2019 14:23:43

## 2019-07-28 ENCOUNTER — Other Ambulatory Visit: Payer: Self-pay

## 2019-07-28 MED ORDER — ACCU-CHEK AVIVA PLUS VI STRP: ORAL_STRIP | 3 refills | Status: DC

## 2019-07-29 ENCOUNTER — Ambulatory Visit: Payer: Medicare Other

## 2019-07-29 ENCOUNTER — Encounter: Payer: Medicare Other | Admitting: Internal Medicine

## 2019-08-04 ENCOUNTER — Other Ambulatory Visit: Payer: Self-pay

## 2019-08-04 NOTE — Patient Outreach (Signed)
Aging Gracefully Program  08/04/2019  Mary [name] [birth year] [id number]  [facility] Evaluation Interviewer attempted to call patient on today regarding Aging Gracefully referral. No answer from patient after multiple rings.  Will attempt to call back within 1 week.  [name] Care Management Assistant [phone]

## 2019-08-08 ENCOUNTER — Emergency Department (HOSPITAL_COMMUNITY)
Admission: EM | Admit: 2019-08-08 | Discharge: 2019-08-08 | Disposition: A | Discharge status: Home / Self Care | Payer: Medicare Other | Attending: Emergency Medicine | Admitting: Emergency Medicine

## 2019-08-08 ENCOUNTER — Emergency Department (HOSPITAL_COMMUNITY): Payer: Medicare Other

## 2019-08-08 ENCOUNTER — Encounter (HOSPITAL_COMMUNITY): Payer: Self-pay | Admitting: Emergency Medicine

## 2019-08-08 ENCOUNTER — Other Ambulatory Visit: Payer: Self-pay

## 2019-08-08 DIAGNOSIS — Z87891 Personal history of nicotine dependence: Secondary | ICD-10-CM | POA: Diagnosis not present

## 2019-08-08 DIAGNOSIS — R2243 Localized swelling, mass and lump, lower limb, bilateral: Secondary | ICD-10-CM | POA: Diagnosis not present

## 2019-08-08 DIAGNOSIS — E119 Type 2 diabetes mellitus without complications: Secondary | ICD-10-CM | POA: Diagnosis not present

## 2019-08-08 DIAGNOSIS — I517 Cardiomegaly: Secondary | ICD-10-CM | POA: Diagnosis not present

## 2019-08-08 DIAGNOSIS — Z7984 Long term (current) use of oral hypoglycemic drugs: Secondary | ICD-10-CM | POA: Insufficient documentation

## 2019-08-08 DIAGNOSIS — R0902 Hypoxemia: Secondary | ICD-10-CM | POA: Diagnosis not present

## 2019-08-08 DIAGNOSIS — R0602 Shortness of breath: Secondary | ICD-10-CM | POA: Insufficient documentation

## 2019-08-08 DIAGNOSIS — Z79899 Other long term (current) drug therapy: Secondary | ICD-10-CM | POA: Insufficient documentation

## 2019-08-08 DIAGNOSIS — R609 Edema, unspecified: Secondary | ICD-10-CM | POA: Diagnosis not present

## 2019-08-08 DIAGNOSIS — I10 Essential (primary) hypertension: Secondary | ICD-10-CM | POA: Insufficient documentation

## 2019-08-08 DIAGNOSIS — Z743 Need for continuous supervision: Secondary | ICD-10-CM | POA: Diagnosis not present

## 2019-08-08 DIAGNOSIS — Z7982 Long term (current) use of aspirin: Secondary | ICD-10-CM | POA: Diagnosis not present

## 2019-08-08 DIAGNOSIS — M7989 Other specified soft tissue disorders: Secondary | ICD-10-CM

## 2019-08-08 DIAGNOSIS — R52 Pain, unspecified: Secondary | ICD-10-CM | POA: Diagnosis not present

## 2019-08-08 LAB — HEPATIC FUNCTION PANEL
ALT: 17 U/L (ref 0–44)
AST: 24 U/L (ref 15–41)
Albumin: 3.6 g/dL (ref 3.5–5.0)
Alkaline Phosphatase: 82 U/L (ref 38–126)
Bilirubin, Direct: 0.2 mg/dL (ref 0.0–0.2)
Indirect Bilirubin: 0.8 mg/dL (ref 0.3–0.9)
Total Bilirubin: 1 mg/dL (ref 0.3–1.2)
Total Protein: 7.6 g/dL (ref 6.5–8.1)

## 2019-08-08 LAB — CBC
HCT: 32 % — ABNORMAL LOW (ref 36.0–46.0)
Hemoglobin: 10.6 g/dL — ABNORMAL LOW (ref 12.0–15.0)
MCH: 31.3 pg (ref 26.0–34.0)
MCHC: 33.1 g/dL (ref 30.0–36.0)
MCV: 94.4 fL (ref 80.0–100.0)
Platelets: 260 10*3/uL (ref 150–400)
RBC: 3.39 MIL/uL — ABNORMAL LOW (ref 3.87–5.11)
RDW: 12.4 % (ref 11.5–15.5)
WBC: 4.8 10*3/uL (ref 4.0–10.5)
nRBC: 0 % (ref 0.0–0.2)

## 2019-08-08 LAB — BASIC METABOLIC PANEL
Anion gap: 9 (ref 5–15)
BUN: 22 mg/dL (ref 8–23)
CO2: 24 mmol/L (ref 22–32)
Calcium: 9.5 mg/dL (ref 8.9–10.3)
Chloride: 109 mmol/L (ref 98–111)
Creatinine, Ser: 1.12 mg/dL — ABNORMAL HIGH (ref 0.44–1.00)
GFR calc Af Amer: 57 mL/min — ABNORMAL LOW (ref >60)
GFR calc non Af Amer: 49 mL/min — ABNORMAL LOW (ref >60)
Glucose, Bld: 100 mg/dL — ABNORMAL HIGH (ref 70–99)
Potassium: 3.8 mmol/L (ref 3.5–5.1)
Sodium: 142 mmol/L (ref 135–145)

## 2019-08-08 LAB — BRAIN NATRIURETIC PEPTIDE: B Natriuretic Peptide: 36.5 pg/mL (ref 0.0–100.0)

## 2019-08-08 LAB — TROPONIN I (HIGH SENSITIVITY): Troponin I (High Sensitivity): 9 ng/L (ref <18)

## 2019-08-08 MED ORDER — FUROSEMIDE 10 MG/ML IJ SOLN
40.0000 mg | Freq: Once | INTRAMUSCULAR | Status: AC
  Administered 2019-08-08: 40 mg via INTRAVENOUS
  Filled 2019-08-08: qty 4

## 2019-08-08 MED ORDER — SODIUM CHLORIDE 0.9% FLUSH: 3.0000 mL | Freq: Once | INTRAVENOUS | Status: DC

## 2019-08-08 NOTE — ED Triage Notes (Signed)
PT to triage via [facility] from home.  Reports increased pedal edema and bilateral leg pain x 4 days.  Not taking her lasix due to decreased mobility and not being able to ambulate to bathroom due to leg pain.

## 2019-08-08 NOTE — ED Provider Notes (Signed)
[place] EMERGENCY DEPARTMENT Provider Note   CSN: [id number] Arrival date & time: 08/08/19  1552     History Chief Complaint  Patient presents with  . Leg Swelling    Mary [name] is a 74 y.o. female with PMHx HTN, HLD, diabetes, and lymphedema who presents to the ED today with complaint of increased edema to bilateral lower legs x 3-4 days.  Pt states that her left leg is typically more swollen than her right, no worse than normal recently. Patient reports she has not been taking her Lasix for the past 3 to 4 days due to not wanting to continuously go to the bathroom.  She states she has felt fatigued and has not wanted to get out of bed.  She denies fevers, chills, chest pain, shortness of breath, cough, abdominal pain, nausea, vomiting, any other additional symptoms.   The history is provided by the patient and medical records.       Past Medical History:  Diagnosis Date  . Arthritis    osteoarthritis  . Cataract    OS  . Diabetes mellitus    Insulin dependent  . Diabetic retinopathy ([place])    NPDR OU  . Glaucoma    POAG OD  . H/O cardiovascular stress test    pt. reports 10/13- was normal per pt.   . Hyperlipemia   . Hypertension   . Hypertensive retinopathy    OU    Patient Active Problem List   Diagnosis Date Noted  . Diabetes mellitus with stage 3 chronic kidney disease ([place]) 02/21/2018  . 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 Primary osteoarthritis of both knees 02/21/2018  . Class 3 severe obesity due to excess calories with serious comorbidity and body mass index (BMI) of 45.0 to 49.9 in adult ([place]) 02/21/2018  . Skin ulcer of right foot with fat layer exposed ([place]) 02/18/2018  . Morbid (severe) obesity due to excess calories ([place]) 11/18/2017  . Knee pain 07/16/2015  . Lateral dislocation of right patella 05/04/2015  . Rupture of right quadriceps tendon 05/24/2014  . S/P TKR (total knee replacement) 05/05/2012    Past  Surgical History:  Procedure Laterality Date  . CARPAL TUNNEL RELEASE Bilateral   . CATARACT EXTRACTION Right 10/05/2018   Dr. Kathlen Mody  . EYE SURGERY Right    Cat Sx  . KNEE ARTHROSCOPY     right  . QUADRICEPS TENDON REPAIR  02/17/2012   Procedure: REPAIR QUADRICEP TENDON;  Surgeon: Vickey Huger, MD;  Location: [place];  Service: Orthopedics;  Laterality: Right;  right quadriceps repair with latera release  . QUADRICEPS TENDON REPAIR Right 05/15/2012   Procedure: REPAIR QUADRICEP TENDON;  Surgeon: Vickey Huger, MD;  Location: [place];  Service: Orthopedics;  Laterality: Right;  . TOTAL KNEE ARTHROPLASTY  10/21/2011   rt tk  . TOTAL KNEE ARTHROPLASTY  10/21/2011   Procedure: TOTAL KNEE ARTHROPLASTY;  Surgeon: Rudean Haskell, MD;  Location: [place];  Service: Orthopedics;  Laterality: Right;  . TUBAL LIGATION       OB History   No obstetric history on file.     Family History  Problem Relation Age of Onset  . Hypertension Mother   . Heart Problems Father   . Gout Father   . Arthritis Father     Social History   Tobacco Use  . Smoking status: Former Smoker    Packs/day: 0.25    Years: 20.00    Pack years: 5.00  Types: Cigarettes    Quit date: 06/28/2014    Years since quitting: 5.1  . Smokeless tobacco: Never Used  Vaping Use  . Vaping Use: Never used  Substance Use Topics  . Alcohol use: No  . Drug use: Not Currently    Types: Oxycodone, Marijuana    Home Medications Prior to Admission medications   Medication Sig Start Date End Date Taking? Authorizing Provider  ACCU-CHEK AVIVA PLUS test strip Use as directed to check blood sugars 1 time per day dx: e11.22 07/28/19   Glendale Chard, MD  aspirin EC 81 MG tablet Take 81 mg by mouth daily.    [provider]  Assure Comfort Lancets 28G [place]  05/17/14   [provider]  Blood Glucose Monitoring Suppl (ACCU-CHEK AVIVA PLUS) w/Device KIT Use as directed to check blood sugars 2 times per day dx: e11.65 03/10/18    Glendale Chard, MD  cetirizine (ZYRTEC) 10 MG tablet TAKE 1 TABLET BY MOUTH EVERY DAY 05/25/19   Glendale Chard, MD  dorzolamide-timolol (COSOPT) 22.3-6.8 MG/ML ophthalmic solution Place 1 drop into the right eye 2 (two) times daily. 03/01/19   [provider]  fluorometholone (FML) 0.1 % ophthalmic suspension Place 1 drop into the right eye 3 (three) times daily. 12/23/18   [provider]  fluticasone (FLONASE) 50 MCG/ACT nasal spray SPRAY 1 SPRAY INTO EACH NOSTRIL EVERY DAY 06/21/19   Glendale Chard, MD  furosemide (LASIX) 40 MG tablet Take 1 tablet (40 mg total) by mouth daily. 02/22/19   Minette Brine, FNP  HYDROcodone-acetaminophen (NORCO) 10-325 MG tablet Take 1 tablet by mouth 5 (five) times daily as needed. 11/27/18   [provider]  KLOR-CON M20 20 MEQ tablet Take 20 mEq by mouth daily. with food 10/15/16   [provider]  levothyroxine (SYNTHROID) 125 MCG tablet Take 125 mcg by mouth daily before breakfast.    [provider]  loratadine (CLARITIN) 10 MG tablet Take 10 mg by mouth daily.    [provider]  magnesium oxide (MAG-OX) 400 MG tablet TAKE 1 TABLET BY MOUTH EVERY EVENING 06/28/19   Glendale Chard, MD  metoprolol tartrate (LOPRESSOR) 50 MG tablet Take 50 mg by mouth 2 (two) times daily.  03/01/19   [provider]  omeprazole (PRILOSEC) 20 MG capsule TAKE 1 CAPSULE BY MOUTH  DAILY 09/29/18   Glendale Chard, MD  pravastatin (PRAVACHOL) 40 MG tablet Take 1 tablet (40 mg total) by mouth daily. 01/27/19   Glendale Chard, MD  prednisoLONE acetate (PRED FORTE) 1 % ophthalmic suspension Place 1 drop into the right eye 3 (three) times daily. 05/13/19   [provider]  predniSONE (DELTASONE) 10 MG tablet Take by mouth. 07/09/19   [provider]  telmisartan (MICARDIS) 80 MG tablet TAKE 1 TABLET BY MOUTH  DAILY 12/03/18   Glendale Chard, MD  triamcinolone cream (KENALOG) 0.1 % APPLY TO AFFECTED AREA TWICE A DAY 06/28/19    Glendale Chard, MD    Allergies    Patient has no known allergies.  Review of Systems   Review of Systems  Constitutional: Negative for chills and fever.  Respiratory: Negative for cough and shortness of breath.   Cardiovascular: Positive for leg swelling. Negative for chest pain.  All other systems reviewed and are negative.   Physical Exam Updated Vital Signs BP (!) 150/71 (BP Location: Left Arm)   Pulse 77   Temp (!) 97.4 F (36.3 C) (Oral)   Resp 17   SpO2  97%   Physical Exam Vitals and nursing note reviewed.  Constitutional:      Appearance: She is not ill-appearing or diaphoretic.  HENT:     Head: Normocephalic and atraumatic.  Eyes:     Conjunctiva/sclera: Conjunctivae normal.  Cardiovascular:     Rate and Rhythm: Normal rate and regular rhythm.  Pulmonary:     Effort: Pulmonary effort is normal.     Breath sounds: Normal breath sounds. No wheezing, rhonchi or rales.  Abdominal:     Palpations: Abdomen is soft.     Tenderness: There is no abdominal tenderness.  Musculoskeletal:     Cervical back: Neck supple.     Right lower leg: Edema present.     Left lower leg: Edema present.     Comments: 1+ pitting edema bilaterally. Dopplerable pulses.   Skin:    General: Skin is warm and dry.  Neurological:     Mental Status: She is alert.     ED Results / Procedures / Treatments   Labs (all labs ordered are listed, but only abnormal results are displayed) Labs Reviewed  BASIC METABOLIC PANEL - Abnormal; Notable for the following components:      Result Value   Glucose, Bld 100 (*)    Creatinine, Ser 1.12 (*)    GFR calc non Af Amer 49 (*)    GFR calc Af Amer 57 (*)    All other components within normal limits  CBC - Abnormal; Notable for the following components:   RBC 3.39 (*)    Hemoglobin 10.6 (*)    HCT 32.0 (*)    All other components within normal limits  HEPATIC FUNCTION PANEL  BRAIN NATRIURETIC PEPTIDE  TROPONIN I (HIGH SENSITIVITY)     EKG None  Radiology DG Chest 2 View  Result Date: 08/08/2019 CLINICAL DATA:  74 year old female with history of edema and leg swelling. EXAM: CHEST - 2 VIEW COMPARISON:  Chest x-ray 07/08/2015. FINDINGS: Lung volumes are low. No consolidative airspace disease. No pleural effusions. No evidence of pulmonary edema. No pneumothorax. No definite suspicious appearing pulmonary nodules or masses are noted. Mild cardiomegaly. The patient is rotated to the right on today's exam, resulting in distortion of the mediastinal contours and reduced diagnostic sensitivity and specificity for mediastinal pathology. IMPRESSION: 1. Low lung volumes, without radiographic evidence of acute cardiopulmonary disease. 2. Mild cardiomegaly. Electronically Signed   By: Vinnie Langton M.D.   On: 08/08/2019 16:45    Procedures Procedures (including critical care time)  Medications Ordered in ED Medications  sodium chloride flush (NS) 0.9 % injection 3 mL (has no administration in time range)  furosemide (LASIX) injection 40 mg (40 mg Intravenous Given 08/08/19 2144)    ED Course  I have reviewed the triage vital signs and the nursing notes.  Pertinent labs & imaging results that were available during my care of the patient were reviewed by me and considered in my medical decision making (see chart for details).    MDM Rules/Calculators/A&P                          74 year old female with a history of lymphedema who presents to the ED today complaining of worsening leg swelling, has been off of her Lasix for the past 3 to 4 days as she has not wanted to continuously wake up in the middle of the night to go to the bathroom.  She states takes her Lasix in the  morning and continues to have this problem.  She has no other complaints at this time.  On arrival to the ED patient is afebrile, nontachycardic nontachypneic and appears to be in no acute distress.  She is able to speak in full sentences without difficulty,  lung clear to auscultation bilaterally.  Satting 100% on room air.  An EKG, chest x-ray, basic lab work was obtained while patient was in the waiting room.  EKG without acute changes at this time.  Unable to crossover in the system.  Chest x-ray clear.  CBC with stable hemoglobin at 10.6.  BMP with creatinine 1.12.  Troponin of 9.  Do not feel patient needs additional troponin at this time.  Will add on LFTs and BNP and provide IV Lasix.  Patient instructed she will need to restart her Lasix as a suspect this is the cause of her worsening leg swelling.   LFTs within normal limits.  BNP within normal limits 36.5.  IV Lasix provided.  Patient to be discharged home at this time with instruction to follow-up with her PCP tomorrow.  She is in agreement with plan and stable for discharge.   This note was prepared using Dragon voice recognition software and may include unintentional dictation errors due to the inherent limitations of voice recognition software.  Final Clinical Impression(s) / ED Diagnoses Final diagnoses:  Leg swelling    Rx / DC Orders ED Discharge Orders    None       Discharge Instructions     Please start taking your Lasix as prescribed. This is likely the cause of the increased swelling in your legs. The remainder of your labs and imaging were reassuring today.   Follow up with your PCP tomorrow for further evaluation.   Return to the ED for any worsening symptoms.        Eustaquio Maize, PA-C 08/08/19 2225    Lennice Sites, DO 08/08/19 2300

## 2019-08-08 NOTE — Discharge Instructions (Addendum)
Please start taking your Lasix as prescribed. This is likely the cause of the increased swelling in your legs. The remainder of your labs and imaging were reassuring today.   Follow up with your PCP tomorrow for further evaluation.   Return to the ED for any worsening symptoms.

## 2019-08-09 ENCOUNTER — Other Ambulatory Visit: Payer: Self-pay

## 2019-08-09 ENCOUNTER — Ambulatory Visit: Payer: Self-pay

## 2019-08-09 NOTE — Telephone Encounter (Signed)
This encounter was created in error - please disregard.

## 2019-08-09 NOTE — Patient Outreach (Signed)
Aging Gracefully Program  08/09/2019  Mary [name] 1945-03-28 [id number]  [facility] Evaluation Interviewer made contact with patient. Aging Gracefully survey completed.   Interviewer will send referral to [name], RN and OT for follow up.   [name] Care Management Assistant [phone]

## 2019-08-12 ENCOUNTER — Telehealth: Payer: Self-pay

## 2019-08-12 NOTE — Telephone Encounter (Signed)
Mary Peng, MD  Mariam Dollar, CMA Pls call pt - why is she not taking her lasix? She went to ER for leg swelling. Does sh erealize that if she does not take the medication to help with swelling, her swelling will get worse? What can be done to improve the compliance with Lasix?

## 2019-08-19 ENCOUNTER — Ambulatory Visit: Payer: Medicare Other | Admitting: Sports Medicine

## 2019-08-24 ENCOUNTER — Telehealth: Payer: Self-pay | Admitting: Occupational Therapy

## 2019-08-29 ENCOUNTER — Other Ambulatory Visit: Payer: Self-pay | Admitting: Internal Medicine

## 2019-08-30 ENCOUNTER — Other Ambulatory Visit: Payer: Self-pay | Admitting: Occupational Therapy

## 2019-08-30 ENCOUNTER — Other Ambulatory Visit: Payer: Self-pay

## 2019-08-30 NOTE — Patient Outreach (Signed)
Aging Gracefully Program  OT Initial Visit  08/30/2019  Mary [name] 07/06/1945 [id number]  Visit:  1- Initial Visit  Start Time:  1100 End Time:  1155 Total Minutes:  55  CCAP: Typical Daily Routine: Typical Daily Routine:: Pt is at home unless she has an MD appointment. Pt has neighbors and a granddaughter who assist with ADLs as needed.   What Types Of Care Problems Are You Having Throughout The Day?: Pt has significant difficulty with mobility and self-care tasks and it is virtually impossible to get into/out of the house.   What Kind Of Help Do You Receive?: Assistance from family and neighbors for ADLs and mobility. Sometimes has to call the fire department if she slides out of bed.   Do You Think You Need Other Types Of Help?: Yes-needs continued assistance for ADLs. Would benefit from lymphedema therapy  What Do You Think Would Make Everyday Life Easier For You?: Being able to get around the house and in/out of the house with less assistance. Being more independent in self-care tasks.   What Is A Good Day Like?: Getting up with minimal difficulty, getting from room to room without fear of falling, legs not hurting.   What Is A Bad Day Like?: Sliding from bed, needing to call family or neighbors for assistance. Being in pain.   Do You Have Time For Yourself?: Yes- most of the day  Patient Reported Equipment: Patient Reported Equipment Currently Used: Agricultural consultantolling Walker, Raised Production designer, theatre/television/filmToilet Seat Functional Mobility-Walking Indoors/Getting Around the [facility]:   Functional Mobility-Walk A Block: Walk A Block: Unable To Do Do You:: Use A Device Importance Of Learning New Strategies:: Not At All  Functional Mobility-Maintain Balance While Showering: Maintaining Balance While Showering: Unable To Do Do You:: No Device/No Assistance Importance Of Learning New Strategies:: Very Much Other Comments:: Pt is unable to get into/out of shower and is unable to stand up for  bathing Observation: Maintain Balance While Showering: N/O Safety: Extreme Risk Efficiency: Not At All Intervention: Yes   Functional Mobility-Stooping, Crouching, Kneeling To Retreive Item: Stooping, Crouching, or Kneeling To Retrieve Item: A Lot Of Difficulty Do You:: Use Personal Assistance Importance Of Learning New Strategies:: Moderate Other Comments:: Does not have AE like a reacher. Is unable to complete from standing position, is able to reach down from sitting somewhat.  Observation: Gayland CurryStoop, Crouch, or Kneel: Independent With Pain, Difficulty, Or Use Of Device (while sitting) Safety: Moderate/Extreme Risk Efficiency: Not At All Intervention: Yes   Functional Mobility-Bending From Standing Position To Pick Up Clothing Off The Floor: Bending Over From Standing Position To Pick Up Clothing Off The Floor: Unable To Do Do You:: Use Personal Assistance Importance Of Learning New Strategies:: A Little   Functional Mobility-Reaching For Items Above Shoulder Level: Reaching For Items Above Shoulder Level: A Little Difficulty Do You:: Use Personal Assistance Importance Of Learning New Strategies:: A Little   Functional Mobility-Climb 1 Flight Of Stairs: Climb 1 Flight Of Stairs: Unable To Do Do You:: Use Both A Device And Personal Assistance Importance Of Learning New Strategies:: Not At All   Functional Mobility-Move In And Out Of Chair: Move In and Out Of A Chair: A Lot Of Difficulty Do You:: Use Both A Device And Personal Assistance Importance Of Learning New Strategies:: Moderate Other Comments:: Pt with weakness and LB lymphedema limiting ability to propel herself from chair Observation: Move In And Out Of Chair: Maximal Assistance Safety: A Little Risk Efficiency: Not At All Intervention: Yes  Functional Mobility-Move In And Out Of Bed: Move In and Out Of Bed: A Lot Of Difficulty Do You:: Use Both A Device And Personal Assistance Importance Of Learning New  Strategies:: Very Much Observation: Move In and Out Of Bed: Moderate Assistance Safety: Moderate/Extreme Risk Efficiency: Somewhat Intervention: Yes   Functional Mobility-Move In And Out Of Bath/Shower: Move In And Out Of A Bath/Shower: Unable To Do Do You:: N/A- Not Applicable Importance Of Learning New Strategies:: Very Much Observation: Move In And Out Of Bath/Shower: N/O Safety: Extreme Risk Efficiency: Not At All Intervention: Yes   Functional Mobility-Get On And Off Toilet: Getting Up From The Floor: Unable To Do Do You:: Use Personal Assistance Importance Of Learning New Strategies:: Not At All   Functional Mobility-Into And Out Of Car, Not Including Driving: Into  And Out Of Car, Not Including Driving: A Lot Of Difficulty Do You:: Use Both A Device And Personal Assistance Importance Of Learning New Strategies:: A Little     Activities of Daily Living-Bathing/Showering: ADL-Bathing/Showering: Moderate Difficulty Do You:: Use Personal Assistance Importance Of Learning New Strategies: A Little   Activities of Daily Living-Personal Hygiene and Grooming: Personal Hygiene and Grooming: A Little Difficulty Do You:: No Device/No Assistance Importance Of Learning New Strategies: Not At All   Activities of Daily Living-Toilet Hygiene: Toilet Hygiene: A Little Difficulty Do You:: No Device/No Assistance Importance Of Learning New Strategies: Not At All   Activities of Daily Living-Put On And Take Off Undergarments (Incl. Fasteners): Put On And Take Off Undergarments (Incl. Fasteners): A Little Difficulty Do You:: Use Personal Assistance Importance Of Learning New Strategies: A Little   Activities of Daily Living-Put On And Take Off Shirt/Dress/Coat (Incl. Fasteners): Put On And Take Off Shirt/Dress/Coat (Incl. Fasteners): A Little Difficulty Do You:: No Device/No Assistance Importance Of Learning New Strategies: Not At All   Activities of Daily Living-Put On And Take  Off Socks And Shoes: Put On And Take Off Socks And  Shoes: A Lot of Difficulty Do You:: Use Personal Assistance Importance Of Learning New Strategies: A Little   Activities of Daily Living-Feed Self: Feed Self: No Difficulty   Activities of Daily Living-Rest And Sleep: Rest and Sleep: No Difficulty   Activities of Daily Living-Sexual Activity: Sexual  Activity: N/A    Instrumental Activities of Daily Living-Light Homemaking (Laundry, Straightening Up, Vacuuming):  Do Light Homemaking (Laundry, Straightening Up, Vacuuming): A Lot of Difficulty Do You:: Use Personal Assistance Importance Of Learning New Strategies: Not At All   Instrumental Activities of Daily Living-Making A Bed: Making a Bed: Moderate Difficulty Do You:: Use Personal Assistance Importance Of Learning New Strategies: Not At All   Instrumental Activities of Daily Living-Washing Dishes By Hand While Standing At The Sink: Washing Dishes By Hand While Standing At The Sink: Moderate Difficulty Do You:: Use Personal Assistance Importance Of Learning New Strategies: Not At All   Instrumental Activities of Daily Living-Grocery Shopping: Do Grocery Shopping: Unable To Do Do You:: Use Personal Assistance Importance Of Learning New Strategies: Not At All   Instrumental Activities of Daily Living-Use Telephone: Use Telephone: No Difficulty   Instrumental Activities of Daily Living-Financial Management: Financial Management: No Difficulty   Instrumental Activities of Daily Living-Medications: Take Medications: No Difficulty   Instrumental Activities of Daily Living-Health Management And Maintenance: Health Management & Maintenance: No Difficulty   Instrumental Activities of Daily Living-Meal Preparation and Clean-Up: Meal Preparation and Clean-Up: Moderate Difficulty Do You:: Use Personal Assistance Importance Of Learning New Strategies: Not  At All   Instrumental Activities of Daily Living-Provide Care For  Others/Pets: Care For Others/Pets: N/A   Instrumental Activities of Daily Living-Take Part In Organized Social Activities: Take Part In Organized Social Activities: Unable To Do Do You:: N/A-Not Applicable Importance Of Learning New Strategies: Not At All   Instrumental Activities of Daily Living-Leisure Participation: Leisure Participation: Unable To Do Do You:: N/A-Not Applicable Importance Of Learning New Strategies: Not At All   Instrumental Activities of Daily Living-Employment/Volunteer Activities: Employment/Volunteer Activities: N/A   Readiness To Change Score:  Readiness to Change Score: 6.67  Home Environment Assessment: Outside Home Entry:: Front: 3 steps to the landing and one more step up into the house. No railings. Back: one large step down into the laundry area, turn and go out door, go around the fence and down to the driveway.  Entryway/Foyer:: Front: enter right into living room. Back: through laundry area and one large or two small steps up into the kitchen  Living Room:: floor is clear for mobility  Kitchen:: Flooring is in disrepair and is a fall hazard  Stairs:: No stairs inside the house  Bathroom:: hallway bathroom: small room with a tub. Pt has plastic toilet riser on the seat, no grab bars. No room for tub bench  Master Bedroom:: Electricity is out. Larger than hallway bathroom, has tub. No room for tub bench. No grab bars.  Laundry:: 2 steps down, no grab bars  Basement:: N/A  Hallways:: clear  Other Home Environment Concerns:: Pt has no electricity in the master bedroom/bathroom area. Mattresses are too high to safely utilize. Pt is unable o succesfully use bedrails, shower chairs/benches, or toilet risers as she is too high of a fall risk   Patient Education: Education Provided: Yes Education Details: Check for Teacher, adult education) Educated: Patient, Other (comment) (granddaughter) Comprehension: Verbalized  Understanding  Goals: Goals Addressed            This Visit's Progress   . Patient Stated       To improve ability to reach items on the floor and overhead.     . Patient Stated       To improve ability to get into and out of the house safely and independently.     . Patient Stated       To improve ability to get into and out of the tub/shower and perform bathing tasks safely and with greater independence.     . Patient Stated       To improve safety and independence getting into and out of the bed.        Post Clinical Reasoning: Clinician View Of Client Situation:: Pt is very limited in mobility due to lymphedema in BLE. Pt is also generally weak and does not have the upper body strength required for mobility tasks such as getting into and out of a chair or the bed. Pt is a extremely high fall risk with getting into/out of the house, as well as attempting to get in/out of the tub. Pt has family to assist however family is in danger of injury due to the physcial assistance required and lack of appropriate tools to assist. Pt has all necessary DME however is unable to use successfully due to layout of bathrooms and height of mattresses.  Client View Of His/Her Situation:: Pt is frustrated with not being able to get in/out of the shower as well as the house without fear (and high risk) of falling. Pt has had to  call the fire department/ambulance multiple times when getting into/out of the bed as she slides off the side while trying to push her way in/out of the bed.  Next Visit Plan:: Provide reacher and educate on use for reaching items on the ground and overhead  [employer], OTR/L  [phone] 08/30/2019

## 2019-09-02 ENCOUNTER — Ambulatory Visit (INDEPENDENT_AMBULATORY_CARE_PROVIDER_SITE_OTHER): Payer: Medicare Other

## 2019-09-02 ENCOUNTER — Encounter: Payer: Medicare Other | Admitting: Internal Medicine

## 2019-09-02 ENCOUNTER — Ambulatory Visit: Payer: Medicare Other

## 2019-09-02 VITALS — Ht 67.0 in | Wt 300.0 lb

## 2019-09-02 DIAGNOSIS — Z0000 Encounter for general adult medical examination without abnormal findings: Secondary | ICD-10-CM

## 2019-09-02 NOTE — Patient Instructions (Signed)
Mary [name] , Thank you for taking time to come for your Medicare Wellness Visit. I appreciate your ongoing commitment to your health goals. Please review the following plan we discussed and let me know if I can assist you in the future.   Screening recommendations/referrals: Colonoscopy: states had cologuard Mammogram: completed 03/17/2018 Bone Density: completed 03/17/2018 Recommended yearly ophthalmology/optometry visit for glaucoma screening and checkup Recommended yearly dental visit for hygiene and checkup  Vaccinations: Influenza vaccine: due Pneumococcal vaccine: due Tdap vaccine: 02/15/2013 Shingles vaccine: discussed   Covid-19: 03/24/2019, 02/27/2019  Advanced directives: Please bring a copy of your POA (Power of Attorney) and/or Living Will to your next appointment.    Conditions/risks identified: none  Next appointment: Follow up in one year for your annual wellness visit    Preventive Care 65 Years and Older, Female Preventive care refers to lifestyle choices and visits with your health care provider that can promote health and wellness. What does preventive care include?  A yearly physical exam. This is also called an annual well check.  Dental exams once or twice a year.  Routine eye exams. Ask your health care provider how often you should have your eyes checked.  Personal lifestyle choices, including:  Daily care of your teeth and gums.  Regular physical activity.  Eating a healthy diet.  Avoiding tobacco and drug use.  Limiting alcohol use.  Practicing safe sex.  Taking low-dose aspirin every day.  Taking vitamin and mineral supplements as recommended by your health care provider. What happens during an annual well check? The services and screenings done by your health care provider during your annual well check will depend on your age, overall health, lifestyle risk factors, and family history of disease. Counseling  Your health care provider may ask  you questions about your:  Alcohol use.  Tobacco use.  Drug use.  Emotional well-being.  Home and relationship well-being.  Sexual activity.  Eating habits.  History of falls.  Memory and ability to understand (cognition).  Work and work Astronomer.  Reproductive health. Screening  You may have the following tests or measurements:  Height, weight, and BMI.  Blood pressure.  Lipid and cholesterol levels. These may be checked every 5 years, or more frequently if you are over 61 years old.  Skin check.  Lung cancer screening. You may have this screening every year starting at age 61 if you have a 30-pack-year history of smoking and currently smoke or have quit within the past 15 years.  Fecal occult blood test (FOBT) of the stool. You may have this test every year starting at age 46.  Flexible sigmoidoscopy or colonoscopy. You may have a sigmoidoscopy every 5 years or a colonoscopy every 10 years starting at age 85.  Hepatitis C blood test.  Hepatitis B blood test.  Sexually transmitted disease (STD) testing.  Diabetes screening. This is done by checking your blood sugar (glucose) after you have not eaten for a while (fasting). You may have this done every 1-3 years.  Bone density scan. This is done to screen for osteoporosis. You may have this done starting at age 24.  Mammogram. This may be done every 1-2 years. Talk to your health care provider about how often you should have regular mammograms. Talk with your health care provider about your test results, treatment options, and if necessary, the need for more tests. Vaccines  Your health care provider may recommend certain vaccines, such as:  Influenza vaccine. This is recommended every year.  Tetanus, diphtheria, and acellular pertussis (Tdap, Td) vaccine. You may need a Td booster every 10 years.  Zoster vaccine. You may need this after age 53.  Pneumococcal 13-valent conjugate (PCV13) vaccine. One dose  is recommended after age 28.  Pneumococcal polysaccharide (PPSV23) vaccine. One dose is recommended after age 72. Talk to your health care provider about which screenings and vaccines you need and how often you need them. This information is not intended to replace advice given to you by your health care provider. Make sure you discuss any questions you have with your health care provider. Document Released: 01/27/2015 Document Revised: 09/20/2015 Document Reviewed: 11/01/2014 Elsevier Interactive Patient Education  2017 [place] Prevention in the Home Falls can cause injuries. They can happen to people of all ages. There are many things you can do to make your home safe and to help prevent falls. What can I do on the outside of my home?  Regularly fix the edges of walkways and driveways and fix any cracks.  Remove anything that might make you trip as you walk through a door, such as a raised step or threshold.  Trim any bushes or trees on the path to your home.  Use bright outdoor lighting.  Clear any walking paths of anything that might make someone trip, such as rocks or tools.  Regularly check to see if handrails are loose or broken. Make sure that both sides of any steps have handrails.  Any raised decks and porches should have guardrails on the edges.  Have any leaves, snow, or ice cleared regularly.  Use sand or salt on walking paths during winter.  Clean up any spills in your garage right away. This includes oil or grease spills. What can I do in the bathroom?  Use night lights.  Install grab bars by the toilet and in the tub and shower. Do not use towel bars as grab bars.  Use non-skid mats or decals in the tub or shower.  If you need to sit down in the shower, use a plastic, non-slip stool.  Keep the floor dry. Clean up any water that spills on the floor as soon as it happens.  Remove soap buildup in the tub or shower regularly.  Attach bath mats  securely with double-sided non-slip rug tape.  Do not have throw rugs and other things on the floor that can make you trip. What can I do in the bedroom?  Use night lights.  Make sure that you have a light by your bed that is easy to reach.  Do not use any sheets or blankets that are too big for your bed. They should not hang down onto the floor.  Have a firm chair that has side arms. You can use this for support while you get dressed.  Do not have throw rugs and other things on the floor that can make you trip. What can I do in the kitchen?  Clean up any spills right away.  Avoid walking on wet floors.  Keep items that you use a lot in easy-to-reach places.  If you need to reach something above you, use a strong step stool that has a grab bar.  Keep electrical cords out of the way.  Do not use floor polish or wax that makes floors slippery. If you must use wax, use non-skid floor wax.  Do not have throw rugs and other things on the floor that can make you trip. What can I do  with my stairs?  Do not leave any items on the stairs.  Make sure that there are handrails on both sides of the stairs and use them. Fix handrails that are broken or loose. Make sure that handrails are as long as the stairways.  Check any carpeting to make sure that it is firmly attached to the stairs. Fix any carpet that is loose or worn.  Avoid having throw rugs at the top or bottom of the stairs. If you do have throw rugs, attach them to the floor with carpet tape.  Make sure that you have a light switch at the top of the stairs and the bottom of the stairs. If you do not have them, ask someone to add them for you. What else can I do to help prevent falls?  Wear shoes that:  Do not have high heels.  Have rubber bottoms.  Are comfortable and fit you well.  Are closed at the toe. Do not wear sandals.  If you use a stepladder:  Make sure that it is fully opened. Do not climb a closed  stepladder.  Make sure that both sides of the stepladder are locked into place.  Ask someone to hold it for you, if possible.  Clearly mark and make sure that you can see:  Any grab bars or handrails.  First and last steps.  Where the edge of each step is.  Use tools that help you move around (mobility aids) if they are needed. These include:  Canes.  Walkers.  Scooters.  Crutches.  Turn on the lights when you go into a dark area. Replace any light bulbs as soon as they burn out.  Set up your furniture so you have a clear path. Avoid moving your furniture around.  If any of your floors are uneven, fix them.  If there are any pets around you, be aware of where they are.  Review your medicines with your doctor. Some medicines can make you feel dizzy. This can increase your chance of falling. Ask your doctor what other things that you can do to help prevent falls. This information is not intended to replace advice given to you by your health care provider. Make sure you discuss any questions you have with your health care provider. Document Released: 10/27/2008 Document Revised: 06/08/2015 Document Reviewed: 02/04/2014 Elsevier Interactive Patient Education  2017 [employer].

## 2019-09-02 NOTE — Progress Notes (Signed)
I connected with Mary [name] today by telephone and verified that I am speaking with the correct person using two identifiers. Location patient: home Location provider: work Persons participating in the virtual visit: Mary [name], Mary Durand LPN.   I discussed the limitations, risks, security and privacy concerns of performing an evaluation and management service by telephone and the availability of in person appointments. I also discussed with the patient that there may be a patient responsible charge related to this service. The patient expressed understanding and verbally consented to this telephonic visit.    Interactive audio and video telecommunications were attempted between this provider and patient, however failed, due to patient having technical difficulties OR patient did not have access to video capability.  We continued and completed visit with audio only.    Vital signs may be patient reported or missing.   Subjective:   Mary [name] is a 74 y.o. female who presents for Medicare Annual (Subsequent) preventive examination.  Review of Systems     Cardiac Risk Factors include: advanced age (>16mn, >>75women);diabetes mellitus;hypertension;obesity (BMI >30kg/m2);sedentary lifestyle     Objective:    Today's Vitals   09/02/19 0959  Weight: 300 lb (136.1 kg)  Height: 5' 7"  (1.702 m)   Body mass index is 46.99 kg/m.  Advanced Directives 09/02/2019 06/28/2019 07/21/2018 10/30/2017 07/16/2015 07/08/2015 06/06/2015  Does Patient Have a Medical Advance Directive? Yes Yes Yes Yes No No No  Type of AParamedicof ABeaverLiving will HOak[place]Living will H[place]Living will HLittle CanadaLiving will - - -  Does patient want to make changes to medical advance directive? - - - No - Patient declined - - -  Copy of H[place]riverin Chart? No - copy requested - No - copy requested No -  copy requested - - -  Would patient like information on creating a medical advance directive? - No - Patient declined - - - No - patient declined information No - patient declined information  Pre-existing out of facility DNR order (yellow form or pink MOST form) - - - - - - -    Current Medications (verified) Outpatient Encounter Medications as of 09/02/2019  Medication Sig  . ACCU-CHEK AVIVA PLUS test strip Use as directed to check blood sugars 1 time per day dx: e11.22  . aspirin EC 81 MG tablet Take 81 mg by mouth daily.  .Marilynne DriversComfort Lancets 28G MISC   . Blood Glucose Monitoring Suppl (ACCU-CHEK AVIVA PLUS) w/Device KIT Use as directed to check blood sugars 2 times per day dx: e11.65  . cetirizine (ZYRTEC) 10 MG tablet TAKE 1 TABLET BY MOUTH EVERY DAY  . dorzolamide-timolol (COSOPT) 22.3-6.8 MG/ML ophthalmic solution Place 1 drop into the right eye 2 (two) times daily.  . fluticasone (FLONASE) 50 MCG/ACT nasal spray SPRAY 1 SPRAY INTO EACH NOSTRIL EVERY DAY  . furosemide (LASIX) 40 MG tablet Take 1 tablet (40 mg total) by mouth daily.  .Marland KitchenHYDROcodone-acetaminophen (NORCO) 10-325 MG tablet Take 1 tablet by mouth 5 (five) times daily as needed.  .Marland Kitchenlevothyroxine (SYNTHROID) 125 MCG tablet Take 125 mcg by mouth daily before breakfast.  . magnesium oxide (MAG-OX) 400 MG tablet TAKE 1 TABLET BY MOUTH EVERY EVENING  . metoprolol tartrate (LOPRESSOR) 50 MG tablet Take 50 mg by mouth 2 (two) times daily.   .Marland Kitchenomeprazole (PRILOSEC) 20 MG capsule TAKE 1 CAPSULE BY MOUTH  DAILY  . pravastatin (PRAVACHOL)  40 MG tablet Take 1 tablet (40 mg total) by mouth daily.  [name] telmisartan (MICARDIS) 80 MG tablet TAKE 1 TABLET BY MOUTH  DAILY  . triamcinolone cream (KENALOG) 0.1 % APPLY TO AFFECTED AREA TWICE A DAY  . fluorometholone (FML) 0.1 % ophthalmic suspension Place 1 drop into the right eye 3 (three) times daily. (Patient not taking: Reported on 09/02/2019)  . KLOR-CON M20 20 MEQ tablet Take 20 mEq by  mouth daily. with food (Patient not taking: Reported on 09/02/2019)  . loratadine (CLARITIN) 10 MG tablet Take 10 mg by mouth daily. (Patient not taking: Reported on 09/02/2019)  . prednisoLONE acetate (PRED FORTE) 1 % ophthalmic suspension Place 1 drop into the right eye 3 (three) times daily. (Patient not taking: Reported on 09/02/2019)  . predniSONE (DELTASONE) 10 MG tablet Take by mouth. (Patient not taking: Reported on 09/02/2019)   No facility-administered encounter medications on file as of 09/02/2019.    Allergies (verified) Patient has no known allergies.   History: Past Medical History:  Diagnosis Date  . Arthritis    osteoarthritis  . Cataract    OS  . Diabetes mellitus    Insulin dependent  . Diabetic retinopathy ([place])    NPDR OU  . Glaucoma    POAG OD  . H/O cardiovascular stress test    pt. reports 10/13- was normal per pt.   . Hyperlipemia   . Hypertension   . Hypertensive retinopathy    OU   Past Surgical History:  Procedure Laterality Date  . CARPAL TUNNEL RELEASE Bilateral   . CATARACT EXTRACTION Right 10/05/2018   Dr. Kathlen Mody  . EYE SURGERY Right    Cat Sx  . KNEE ARTHROSCOPY     right  . QUADRICEPS TENDON REPAIR  02/17/2012   Procedure: REPAIR QUADRICEP TENDON;  Surgeon: Vickey Huger, MD;  Location: [place];  Service: Orthopedics;  Laterality: Right;  right quadriceps repair with latera release  . QUADRICEPS TENDON REPAIR Right 05/15/2012   Procedure: REPAIR QUADRICEP TENDON;  Surgeon: Vickey Huger, MD;  Location: [place];  Service: Orthopedics;  Laterality: Right;  . TOTAL KNEE ARTHROPLASTY  10/21/2011   rt tk  . TOTAL KNEE ARTHROPLASTY  10/21/2011   Procedure: TOTAL KNEE ARTHROPLASTY;  Surgeon: Rudean Haskell, MD;  Location: [place];  Service: Orthopedics;  Laterality: Right;  . TUBAL LIGATION     Family History  Problem Relation Age of Onset  . Hypertension Mother   . Heart Problems Father   . Gout Father   . Arthritis Father    Social History    Socioeconomic History  . Marital status: Single    Spouse name: Not on file  . Number of children: Not on file  . Years of education: Not on file  . Highest education level: Not on file  Occupational History  . Occupation: retired  Tobacco Use  . Smoking status: Former Smoker    Packs/day: 0.25    Years: 20.00    Pack years: 5.00    Types: Cigarettes    Quit date: 06/28/2014    Years since quitting: 5.1  . Smokeless tobacco: Never Used  Vaping Use  . Vaping Use: Never used  Substance and Sexual Activity  . Alcohol use: No  . Drug use: Yes    Types: Hydrocodone  . Sexual activity: Not Currently  Other Topics Concern  . Not on file  Social History Narrative  . Not on file   Social Determinants of Health  Financial Resource Strain: Low Risk   . Difficulty of Paying Living Expenses: Not hard at all  Food Insecurity: No Food Insecurity  . Worried About Charity fundraiser in the Last Year: Never true  . Ran Out of Food in the Last Year: Never true  Transportation Needs: Unmet Transportation Needs  . Lack of Transportation (Medical): Yes  . Lack of Transportation (Non-Medical): Yes  Physical Activity: Inactive  . Days of Exercise per Week: 0 days  . Minutes of Exercise per Session: 0 min  Stress: No Stress Concern Present  . Feeling of Stress : Not at all  Social Connections:   . Frequency of Communication with Friends and Family: Not on file  . Frequency of Social Gatherings with Friends and Family: Not on file  . Attends Religious Services: Not on file  . Active Member of Clubs or Organizations: Not on file  . Attends Archivist Meetings: Not on file  . Marital Status: Not on file    Tobacco Counseling Counseling given: Not Answered   Clinical Intake:  Pre-visit preparation completed: Yes  Pain : No/denies pain     Nutritional Status: BMI > 30  Obese Nutritional Risks: None Diabetes: Yes  How often do you need to have someone help you  when you read instructions, pamphlets, or other written materials from your doctor or pharmacy?: 1 - Never What is the last grade level you completed in school?: [employer]  Diabetic? Yes Nutrition Risk Assessment:  Has the patient had any N/V/D within the last 2 months?  No  Does the patient have any non-healing wounds?  No  Has the patient had any unintentional weight loss or weight gain?  No   Diabetes:  Is the patient diabetic?  Yes  If diabetic, was a CBG obtained today?  No  Did the patient bring in their glucometer from home?  No  How often do you monitor your CBG's? Every other day.   Financial Strains and Diabetes Management:  Are you having any financial strains with the device, your supplies or your medication? No .  Does the patient want to be seen by Chronic Care Management for management of their diabetes?  No  Would the patient like to be referred to a Nutritionist or for Diabetic Management?  No   Diabetic Exams:  Diabetic Eye Exam: Completed 09/03/2019 Diabetic Foot Exam: Overdue, Pt has been advised about the importance in completing this exam. Pt is scheduled for diabetic foot exam on next appointment.   Interpreter Needed?: No  Information entered by :: NAllen LPN   Activities of Daily Living In your present state of health, do you have any difficulty performing the following activities: 09/02/2019  Hearing? N  Vision? N  Difficulty concentrating or making decisions? N  Walking or climbing stairs? Y  Dressing or bathing? N  Doing errands, shopping? Y  Comment needs to have someone with her  Preparing Food and eating ? Y  Comment has aide  Using the Toilet? N  In the past six months, have you accidently leaked urine? N  Do you have problems with loss of bowel control? N  Managing your Medications? N  Managing your Finances? N  Housekeeping or managing your Housekeeping? Y  Some recent data might be hidden    Patient Care Team: Glendale Chard, MD as PCP - General (Internal Medicine) Hayden Pedro, MD as Consulting Physician (Ophthalmology) Jacelyn Pi, MD as Consulting Physician (Endocrinology) Rex Kras, Glenard Haring  L, RN as Case Manager [name] as Social Worker Troxler, Lujean Amel, OT as Warden/ranger (Occupational Therapy)  Indicate any recent [place] you may have received from other than [facility] providers in the past year (date may be approximate).     Assessment:   This is a routine wellness examination for Emiline.  Hearing/Vision screen  Hearing Screening   125Hz  250Hz  500Hz  1000Hz  2000Hz  3000Hz  4000Hz  6000Hz  8000Hz   Right ear:           Left ear:           Vision Screening Comments: Regular eye exams, Dr. Terrilee Files  Dietary issues and exercise activities discussed: Current Exercise Habits: The patient does not participate in regular exercise at present  Goals    .  "I would like to new power Scooter to get around better outside of my home" (pt-stated)      [place] (see longitudinal plan of care for additional care plan information)  Current Barriers:  [name] Knowledge Deficits related to determination for best DME to help improve mobility  . Chronic Disease Management support and education needs related to Diabetes Mellitus with stage 3 Chronic Kidney disease, Parenchymal renal hypertension  . Impaired Physical Mobility   Nurse Case Manager Clinical Goal(s):  [name] Over the next 90 days, patient will verbalize understanding of plan for evaluation and determination for best DME to help improve patient's mobility, manual W/C, verses electric W/C, verses power Scooter   CCM RN CM Interventions:  07/12/19 call completed with patient  . Inter-disciplinary care team collaboration (see longitudinal plan of care) . Evaluation of current treatment plan related to Impaired Physical Mobility and DME needs and patient's adherence to plan as established by provider. . Advised patient to discuss her  DME needs with PCP at next OV in order for a face to face evaluation to be completed and PCP help make recommendations for best mechanical mobility option . Collaborated with PCP Dr. Glendale Chard regarding patient's request to be evaluated for mechanical DME such as an electric W/C and or power Scooter; Advised patient believes having a power Scooter for outdoor use is what she prefers  . Discussed plans with patient for ongoing care management follow up and provided patient with direct contact information for care management team  Patient Self Care Activities:  . Self administers medications as prescribed . Attends all scheduled provider appointments . Calls pharmacy for medication refills . Performs ADL's independently . Performs IADL's independently . Calls provider office for new concerns or questions  Initial goal documentation     .  "to get the swelling down in my legs" (pt-stated)      CARE PLAN ENTRY (see longitudinal plan of care for additional care plan information)  Current Barriers:  [name] Knowledge Deficits related to evaluation and treatment of bilateral lymphedema  . Chronic Disease Management support and education needs related to Diabetes Mellitus with stage 3 Chronic Kidney disease, Parenchymal renal hypertension   Nurse Case Manager Clinical Goal(s):  [name] Over the next 30 days, patient will complete her new patient appointment with [facility] for evaluation and treatment of lymphedema to her lower extremities  . Over the next 90 days, patient will work with the CCM team and PCP to address needs related to disease education and support to help improve Self Health management of Lymphedema  CCM RN CM Interventions:  07/12/19 call completed with patient  . Inter-disciplinary care team collaboration (see longitudinal  plan of care) . Evaluation of current treatment plan related to right foot ulceration and bilateral lower leg lymphedema and patient's  adherence to plan as established by provider . Determined patient is established with Dr. Cannon Kettle DPM for care of her right foot ulcer. Her last OV completed on 07/06/19 is noted with the following Assessment/Plan:  . Problem List Items Addressed This Visit  .  Visit Diagnoses  .    [name] Pre-ulcerative calluses    -  Primary .    [name] Pain due to onychomycosis of toenails of both feet     .    [name] PVD (peripheral vascular disease) ([place])     .    [name] Foot ulcer, right, limited to breakdown of skin ([place])     .    [name] Diabetic polyneuropathy associated with type 2 diabetes mellitus ([place])     .    . Lymphedema       . -Examined patient and discussed the progression of the pinpoint ulceration and chronic edema . -Mechanically dedbrided ulcerated callus and callus skin bilateral using a sterile chisel blade, no opening to right foot. Betadine applied to protect skin on the right cover with a Band-Aid and advised patient if she would benefit from lymphedema management with wound center as well as then helping to care for the right foot ulceration to prevent it from getting worse.  . -Continue with offloading padding to shoes bilateral . -At no additional charge mechanically debrided nails using sterile nail nipper without incident  . -Patient to return to office in 6 weeks for wound/callus check or sooner if problems arise . Determined patient will f/u at the [facility] on 07/27/19 for evaluation and treatment of lymphadema . Determined patient will use [employer] transportation to get her to her appointment  . Discussed plans with patient for ongoing care management follow up and provided patient with direct contact information for care management team  Patient Self Care Activities:  . Self administers medications as prescribed . Attends all scheduled provider appointments . Calls pharmacy for medication refills . Calls provider office for new concerns or questions  Initial goal documentation     .   "to improve my kidney function" (pt-stated)      CARE PLAN ENTRY (see longitudinal plan of care for additional care plan information)  Current Barriers:  [name] Knowledge Deficits related to disease process and Self Health management of CKD . Chronic Disease Management support and education needs related to Diabetes Mellitus with stage 3 Chronic Kidney disease, Parenchymal renal hypertension   Nurse Case Manager Clinical Goal(s):  [name] Over the next 90 days, patient will work with the CCM team and PCP to address needs related to disease education and support to improve Health Management of CKD   CCM RN CM Interventions:  07/12/19 call completed with patient  . Inter-disciplinary care team collaboration (see longitudinal plan of care) . Evaluation of current treatment plan related to CKD and patient's adherence to plan as established by provider. . Provided education to patient re: disease process including stages of CKD; Educated on patient's GFR and ways to improve Self Health Management of CKD . Discussed plans with patient for ongoing care management follow up and provided patient with direct contact information for care management team . Provided patient with printed educational materials related to Stages of Chronic Kidney Disease; 6 Ways to be Water Wise; [employer] Supplements for kidney patients   Patient Self Care Activities:  .  Self administers medications as prescribed . Attends all scheduled provider appointments . Calls pharmacy for medication refills . Performs ADL's independently . Performs IADL's independently . Calls provider office for new concerns or questions  Initial goal documentation     .  Exercise 150 min/wk Moderate Activity (pt-stated)    .  I would like to manage my diabetes (pt-stated)      Current Barriers:  . Diabetes: O0HO; complicated by chronic medical conditions including CKD, HTN, HLD, most recent A1c 5.1% on 06/24/18 . Current antihyperglycemic regimen:  Ozempic 0.32m weekly o Gi/diarrhea resolved--Ozempic 0.235mrestarted o Will f/u with patient in 1-42-monthDenies hypoglycemic symptoms; denies hyperglycemic symptoms . Current meal patterns: n/a . Current exercise: walking . Current blood glucose readings: FBG 100-120s . Cardiovascular risk reduction: o Current hypertensive regimen: telmisartain, metoprolol o Current hyperlipidemia regimen:  pravastatin 54m35myopathy with other statin, continue current management) o Current antiplatelet regimen: ASA  Pharmacist Clinical Goal(s):  . OvMarland Kitchenr the next 90 days, patient will work with PharmD and primary care provider to address needs related to diabetes management  Interventions: . Comprehensive medication review performed, medication list updated in electronic medical record . Reviewed & discussed the following diabetes-related information with patient: o Continue checking blood sugars as directed o Follow ADA recommended "diabetes-friendly" diet  (reviewed healthy snack/food options) o Discussed GLP-1 injection technique; Patient uses AccuChek Aviva glucometer o Reviewed medication purpose/side effects  Patient Self Care Activities:  . Patient will check blood glucose 3x weekly, document, and provide at future appointments . Patient will focus on medication adherence  . Patient will take medications as prescribed . Patient will contact provider with any episodes of hypoglycemia . Patient will report any questions or concerns to provider   Initial goal documentation     .  Patient Stated    .  Patient Stated      To improve ability to reach items on the floor and overhead.     .  Patient Stated      To improve ability to get into and out of the house safely and independently.     .  Patient Stated      To improve ability to get into and out of the tub/shower and perform bathing tasks safely and with greater independence.     .  Patient Stated      To improve safety and  independence getting into and out of the bed.     .  Patient Stated      09/02/2019, get back on her feet      Depression Screen PHQ 2/9 Scores 09/02/2019 08/09/2019 07/21/2018 06/24/2018 02/18/2018 11/18/2017 10/30/2017  PHQ - 2 Score 0 0 0 0 0 0 0  PHQ- 9 Score - - 0 - - - -    Fall Risk Fall Risk  09/02/2019 08/30/2019 11/16/2018 07/21/2018 06/24/2018  Falls in the past year? 1 1 1  0 0  Comment legs gave away, slid out of the bed - - - -  Number falls in past yr: 1 1 1  - -  Comment - - - - -  Injury with Fall? 0 0 0 - -  Risk for fall due to : Impaired mobility;History of fall(s);Medication side effect History of fall(s);Impaired balance/gait;Impaired mobility;Impaired vision - Medication side effect -  Follow up Falls evaluation completed;Education provided;Falls prevention discussed Falls evaluation completed;Education provided;Falls prevention discussed - Falls evaluation completed;Falls prevention discussed -    Any stairs in or around  the home? Yes  If so, are there any without handrails? Yes  Home free of loose throw rugs in walkways, pet beds, electrical cords, etc? Yes  Adequate lighting in your home to reduce risk of falls? Yes   ASSISTIVE DEVICES UTILIZED TO PREVENT FALLS:  Life alert? Yes  Use of a cane, walker or w/c? Yes  Grab bars in the bathroom? No  Shower chair or bench in shower? No  Elevated toilet seat or a handicapped toilet? No   TIMED UP AND GO:  Was the test performed? No . .   Cognitive Function:     6CIT Screen 09/02/2019 07/21/2018 10/30/2017  What Year? 0 points 0 points 0 points  What month? 0 points 0 points 0 points  What time? 0 points 0 points 0 points  Count back from 20 0 points 0 points 0 points  Months in reverse 0 points 0 points 0 points  Repeat phrase 0 points 0 points 0 points  Total Score 0 0 0    Immunizations Immunization History  Administered Date(s) Administered  . Influenza Split 10/22/2011  . Influenza, High Dose Seasonal PF  11/16/2018, 12/26/2018  . Influenza-Unspecified 10/16/2017  . PFIZER SARS-COV-2 Vaccination 02/27/2019, 03/24/2019  . Pneumococcal Polysaccharide-23 10/15/2016  . Tdap 02/15/2013    TDAP status: Up to date Flu Vaccine status: Up to date Pneumococcal vaccine status: needs prevnar 13 Covid-19 vaccine status: Completed vaccines  Qualifies for Shingles Vaccine? Yes   Zostavax completed No   Shingrix Completed?: No.    Education has been provided regarding the importance of this vaccine. Patient has been advised to call insurance company to determine out of pocket expense if they have not yet received this vaccine. Advised may also receive vaccine at local pharmacy or Health Dept. Verbalized acceptance and understanding.  Screening Tests Health Maintenance  Topic Date Due  . FOOT EXAM  Never done  . COLONOSCOPY  Never done  . PNA vac Low Risk Adult (2 of 2 - PCV13) 10/15/2017  . INFLUENZA VACCINE  08/15/2019  . HEMOGLOBIN A1C  10/07/2019  . MAMMOGRAM  03/16/2020  . OPHTHALMOLOGY EXAM  09/02/2020  . TETANUS/TDAP  02/16/2023  . DEXA SCAN  Completed  . COVID-19 Vaccine  Completed  . Hepatitis C Screening  Completed    Health Maintenance  Health Maintenance Due  Topic Date Due  . FOOT EXAM  Never done  . COLONOSCOPY  Never done  . PNA vac Low Risk Adult (2 of 2 - PCV13) 10/15/2017  . INFLUENZA VACCINE  08/15/2019    Colorectal cancer screening: Completed 2020. Repeat every 3 years Mammogram status: Completed 03/17/2018. Repeat every year Bone Density status: Completed 03/17/2018.   Lung Cancer Screening: (Low Dose CT Chest recommended if Age 22-80 years, 30 pack-year currently smoking OR have quit w/in 15years.) does not qualify.   Lung Cancer Screening Referral: no  Additional Screening:  Hepatitis C Screening: does qualify; Completed 11/18/2017  Vision Screening: Recommended annual ophthalmology exams for early detection of glaucoma and other disorders of the eye. Is the  patient up to date with their annual eye exam?  Yes  Who is the provider or what is the name of the office in which the patient attends annual eye exams? Dr. Terrilee Files If pt is not established with a provider, would they like to be referred to a provider to establish care? No .   Dental Screening: Recommended annual dental exams for proper oral hygiene  Community Resource Referral / Chronic  Care Management: CRR required this visit?  No   CCM required this visit?  No      Plan:     I have personally reviewed and noted the following in the patient's chart:   . Medical and social history . Use of alcohol, tobacco or illicit drugs  . Current medications and supplements . Functional ability and status . Nutritional status . Physical activity . Advanced directives . List of other physicians . Hospitalizations, surgeries, and ER visits in previous 12 months . Vitals . Screenings to include cognitive, depression, and falls . Referrals and appointments  In addition, I have reviewed and discussed with patient certain preventive protocols, quality metrics, and best practice recommendations. A written personalized care plan for preventive services as well as general preventive health recommendations were provided to patient.     Kellie Simmering, LPN   [id number]   Nurse Notes:

## 2019-09-03 ENCOUNTER — Other Ambulatory Visit: Payer: Self-pay | Admitting: *Deleted

## 2019-09-03 ENCOUNTER — Encounter (INDEPENDENT_AMBULATORY_CARE_PROVIDER_SITE_OTHER): Payer: Medicare Other | Admitting: Ophthalmology

## 2019-09-03 DIAGNOSIS — I10 Essential (primary) hypertension: Secondary | ICD-10-CM

## 2019-09-03 DIAGNOSIS — Z961 Presence of intraocular lens: Secondary | ICD-10-CM

## 2019-09-03 DIAGNOSIS — H401111 Primary open-angle glaucoma, right eye, mild stage: Secondary | ICD-10-CM

## 2019-09-03 DIAGNOSIS — H25812 Combined forms of age-related cataract, left eye: Secondary | ICD-10-CM

## 2019-09-03 DIAGNOSIS — H3581 Retinal edema: Secondary | ICD-10-CM

## 2019-09-03 DIAGNOSIS — H35033 Hypertensive retinopathy, bilateral: Secondary | ICD-10-CM

## 2019-09-03 DIAGNOSIS — H34813 Central retinal vein occlusion, bilateral, with macular edema: Secondary | ICD-10-CM

## 2019-09-03 DIAGNOSIS — E113313 Type 2 diabetes mellitus with moderate nonproliferative diabetic retinopathy with macular edema, bilateral: Secondary | ICD-10-CM

## 2019-09-03 NOTE — Patient Outreach (Signed)
Aging Gracefully Program  09/03/2019  Mary [name] [birth year] [id number]   Placed call to client to schedule initial Aging Gracefully RN home visit; HIPAA/ identity verified.     Visit scheduled with client for Monday September 13, 2019 at 1:00 pm; verified patient's address and explained that I would contact her by phone on day of scheduled visit prior to arriving at her home, to complete coronavirus questionnaire screening tool; confirmed that patient has a mask available at her home for use during scheduled visit.    Plan:   Scheduled RN initial home visit on Monday September 13, 2019  Caryl Pina, RN, BSN, [employer] Coordinator [facility] Care Management  [phone]

## 2019-09-07 ENCOUNTER — Other Ambulatory Visit: Payer: Self-pay

## 2019-09-07 ENCOUNTER — Ambulatory Visit (INDEPENDENT_AMBULATORY_CARE_PROVIDER_SITE_OTHER): Payer: Medicare Other

## 2019-09-07 ENCOUNTER — Telehealth: Payer: Medicare Other

## 2019-09-07 DIAGNOSIS — I129 Hypertensive chronic kidney disease with stage 1 through stage 4 chronic kidney disease, or unspecified chronic kidney disease: Secondary | ICD-10-CM

## 2019-09-07 DIAGNOSIS — E1122 Type 2 diabetes mellitus with diabetic chronic kidney disease: Secondary | ICD-10-CM

## 2019-09-07 DIAGNOSIS — N183 Chronic kidney disease, stage 3 unspecified: Secondary | ICD-10-CM

## 2019-09-07 DIAGNOSIS — R600 Localized edema: Secondary | ICD-10-CM

## 2019-09-07 DIAGNOSIS — E039 Hypothyroidism, unspecified: Secondary | ICD-10-CM

## 2019-09-09 ENCOUNTER — Other Ambulatory Visit: Payer: Self-pay

## 2019-09-09 ENCOUNTER — Ambulatory Visit: Payer: Self-pay

## 2019-09-09 ENCOUNTER — Telehealth: Payer: Medicare Other

## 2019-09-09 DIAGNOSIS — E039 Hypothyroidism, unspecified: Secondary | ICD-10-CM

## 2019-09-09 DIAGNOSIS — E1122 Type 2 diabetes mellitus with diabetic chronic kidney disease: Secondary | ICD-10-CM

## 2019-09-09 DIAGNOSIS — R600 Localized edema: Secondary | ICD-10-CM

## 2019-09-09 DIAGNOSIS — I129 Hypertensive chronic kidney disease with stage 1 through stage 4 chronic kidney disease, or unspecified chronic kidney disease: Secondary | ICD-10-CM

## 2019-09-09 DIAGNOSIS — N183 Chronic kidney disease, stage 3 unspecified: Secondary | ICD-10-CM

## 2019-09-13 ENCOUNTER — Other Ambulatory Visit: Payer: Self-pay

## 2019-09-13 ENCOUNTER — Other Ambulatory Visit: Payer: Self-pay | Admitting: *Deleted

## 2019-09-13 ENCOUNTER — Encounter: Payer: Self-pay | Admitting: *Deleted

## 2019-09-13 NOTE — Patient Instructions (Signed)
Visit Information  Goals Addressed              This Visit's Progress     Patient Stated   .  "I would like to new power Scooter to get around better outside of my home" (pt-stated)   Not on track     CARE PLAN ENTRY (see longitudinal plan of care for additional care plan information)  Current Barriers:  [name] Knowledge Deficits related to determination for best DME to help improve mobility  . Chronic Disease Management support and education needs related to Diabetes Mellitus with stage 3 Chronic Kidney disease, Parenchymal renal hypertension  . Impaired Physical Mobility   Nurse Case Manager Clinical Goal(s):  [name] Over the next 90 days, patient will verbalize understanding of plan for evaluation and determination for best DME to help improve patient's mobility, manual W/C, verses electric W/C, verses power Scooter   CCM RN CM Interventions:  09/09/19 call completed with patient  . Inter-disciplinary care team collaboration (see longitudinal plan of care) . Evaluation of current treatment plan related to Impaired Physical Mobility and DME needs and patient's adherence to plan as established by provider. . Advised patient to discuss her DME needs with PCP at next OV in order for a face to face evaluation to be completed and PCP help make recommendations for best mechanical mobility option: Determined patient will benefit from having a power W/C verses a Scooter due to having home modifications with the assistance of [employer], discussed this will allow her to be mobile inside and outside of her home including a W/C ramp; Encouraged patient to reach out to [facility] to determine her out of pocket expense for power W/C or Scooter . Provided education to patient re: options to purchase or rent to purchase a manual W/C; Provided patient with the contact number for [facility] and advised this DME supplier will also assist with a W/C ramp if needed for temporary use until [employer] can install permanent ramp; Determined patient will purchase a manual W/C out of pocket to allow Medicare reimbursement for power W/C; Determined patient will contact this DME supplier for this purchase ASAP  . Discussed plans with patient for ongoing care management follow up and provided patient with direct contact information for care management team  Patient Self Care Activities:  . Self administers medications as prescribed . Attends all scheduled provider appointments . Calls pharmacy for medication refills . Performs ADL's independently . Performs IADL's independently . Calls provider office for new concerns or questions  Please see past updates related to this goal by clicking on the "Past Updates" button in the selected goal      .  "to get the swelling down in my legs" (pt-stated)   Not on track     CARE PLAN ENTRY (see longitudinal plan of care for additional care plan information)  Current Barriers:  [name] Knowledge Deficits related to evaluation and treatment of bilateral lymphedema  . Chronic Disease Management support and education needs related to Diabetes Mellitus with stage 3 Chronic Kidney disease, Parenchymal renal hypertension   Nurse Case Manager Clinical Goal(s):  [name] Over the next 30 days, patient will complete her new patient appointment with the [facility] for evaluation and treatment of lymphedema to her lower extremities  . Over the next 90 days, patient will work with the CCM team and PCP to address needs related to disease education and support to help improve Self Health management  of Lymphedema  CCM RN CM Interventions:  09/09/19 call completed with patient  . Inter-disciplinary care team collaboration (see longitudinal plan of care) . Evaluation of current treatment plan related to right foot ulceration and bilateral lower leg lymphedema and patient's adherence to plan as established by provider . Determined patient is established  with Dr. Marylene Land DPM for care of her right foot ulcer. Her last OV completed on 07/06/19 is noted with the following Assessment/Plan:  . Problem List Items Addressed This Visit  .  Visit Diagnoses  .    [name] Pre-ulcerative calluses    -  Primary .    [name] Pain due to onychomycosis of toenails of both feet     .    [name] PVD (peripheral vascular disease) (HCC)     .    [name] Foot ulcer, right, limited to breakdown of skin (HCC)     .    [name] Diabetic polyneuropathy associated with type 2 diabetes mellitus (HCC)     .    . Lymphedema       . -Examined patient and discussed the progression of the pinpoint ulceration and chronic edema . -Mechanically dedbrided ulcerated callus and callus skin bilateral using a sterile chisel blade, no opening to right foot. Betadine applied to protect skin on the right cover with a Band-Aid and advised patient if she would benefit from lymphedema management with wound center as well as then helping to care for the right foot ulceration to prevent it from getting worse.  . -Continue with offloading padding to shoes bilateral . -At no additional charge mechanically debrided nails using sterile nail nipper without incident  . -Patient to return to office in 6 weeks for wound/callus check or sooner if problems arise . Determined patient completed a f/u visit at the [facility] on 07/27/19 for evaluation and treatment of lymphadema . Currently patient is wearing compression stockings and elevating extremities but reports lymphedema has worsened; Discussed RN from Aging Gracefully will make a home visit on 09/13/19 . Collaborated with PCP Dr. Dorothyann Peng regarding patient's request to be evaluated for mechanical DME such as an electric W/C and or power Scooter; Advised of patient reported worsening lymphedema; Advised RN nurse visit from Aging Gracefully is scheduled for 09/13/19; Discussed having a Remote Health referral placed for ongoing clinical support and advised PCP, sending  a Remote Health referral will not delay or prevent services from Aging Gracefully, Advised patient will need her PCP AWV rescheduled due to having previous visit cancelled for unknown reason    . Discussed plans with patient for ongoing care management follow up and provided patient with direct contact information for care management team  Patient Self Care Activities:  . Self administers medications as prescribed . Attends all scheduled provider appointments . Calls pharmacy for medication refills . Calls provider office for new concerns or questions  Please see past updates related to this goal by clicking on the "Past Updates" button in the selected goal         Patient verbalizes understanding of instructions provided today.   Telephone follow up appointment with care management team member scheduled for: 09/16/19  Delsa Sale, RN, BSN, CCM Care Management Coordinator [facility] Care Management/[facility]  Direct Phone: [phone]

## 2019-09-13 NOTE — Patient Outreach (Signed)
Aging Physiological scientist Visit # 1  09/13/2019  Mary [name] 14-Mar-1945 [id number]  Visit:   RN Visit # 1  Start Time:   12:30 pm End Time:   1:55 pm Total Minutes:   85 minutes  Readiness To Change Score:  Readiness to Change Score: 7.33  Universal RN Interventions: Calendar Distribution: Yes Exercise Review: Yes (modified around client ability and mobility limitations with lymphedema) Medications: Yes (provided pill box and instructed in use of same; reviewed medications and discussed high-risk medications with patient) Medication Changes: No Mood: Yes Pain: Yes PCP Advocacy/Support: Yes Fall Prevention: Yes Incontinence: Yes  Healthcare Provider Communication: Did Surveyor, mining With Client's Healthcare Provider?: Yes (PCP Chronic CM RN at PCP office- provided update around patient's stated needs for PT and wheelchair) Method Of Communication: Hotel manager of Client Situation: Clinician View Of Client Situation: Mary [name] is significantly challenged by her relatively new diagnosis of lymphedema, and has started experiencing more falls since she has had lymphedema; she is working with her PCP and [facility] RN CM to get DME and requests PT; 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  She is knowledgeable around her medications and uses her walker for all ambulation.  Her family is supportive and helpful, but she reports that she tries to be as independent as she can so as to not overwhelm her family members.  Her home is neat and there is no overt clutter or fall hazards; as per OT note, there are numerous potential home modifications that would be helpful in preventing patient from having falls, including but not limited to: shower/ tub modification- grab bars; rails on entry ways; ramp at entry way, kitchen floor repair.  Client understands her limitations and appears to be motivated to try to prevent falls in the future    Client's View of His/Her Situation: Client View Of His/Her Situation: "Things are much more rough since I developed this lymphedema over the last few months.  I am working with my doctor, and she is taking it seriously; I feel like I need to get some physical therapy and a wheelchair so I don't fall so much; I need to get on the ball and get back to where I was before COVID and before this lymphedema happened.  I hope it will get better and look forward to having my home repaired so I can get in and out of the door without fear of falling all the time."  Medication Assessment: Do You Have Any Problems Paying For Medications?: No Where Does Client Store Medications?: Other: (bedroom) Can Client Read Pill Bottles?: Yes Does Client Use A Pillbox?: No (pill box provided today; client instructed in safe use of pill box) Does Anyone Assist Client In Taking Medications?: No Do You Take Vitamin D?: Yes (through [facility]) Total Number Of Medications That The Client Takes: 14 Does Client Have Any Questions Or Concerns About Medictions?: No Is Client Complaining Of Any Symptoms That Could Be Side Effects To Medications?: No Any Possible Changes In Medication Regimen?: No  OT Update: Will update Aging Gracefully team around successful completion of today's RN Visit # 1  Session Summary: [facility] Aging Gracefully RN Visit # 1; patient admits to obvious challenges around her newly diagnosed lymphedema but appears committed to adhering to her doctor's prescribed plan of care around same; notified [facility] RN CM embedded in patient's PCP office around successful completion of Aging Gracefully RN Visit #  1  I appreciate the opportunity to participate in Mary [name]'s self- care around her goal of fall prevention,  Caryl Pina, RN, BSN, CCRN [facility] Coordinator [facility] Care Management  [phone]

## 2019-09-13 NOTE — Chronic Care Management (AMB) (Signed)
Chronic Care Management   Follow Up Note   09/09/2019 Name: Mary [name] MRN: [id number] DOB: [birth year]  Referred by: Mary [name] Reason for referral : Chronic Care Management (CCM Inbound call from patient )   Mary [name] is a 74 y.o. year old female who is a primary care patient of Mary [name]. The CCM team was consulted for assistance with chronic disease management and care coordination needs.    Review of patient status, including review of consultants reports, relevant laboratory and other test results, and collaboration with appropriate care team members and the patient's provider was performed as part of comprehensive patient evaluation and provision of chronic care management services.    SDOH (Social Determinants of Health) assessments performed: Yes See Care Plan activities for detailed interventions related to [place])   Placed outbound call to patient for a CCM RN CM care plan update.     Outpatient Encounter Medications as of 09/09/2019  Medication Sig Note  . ACCU-CHEK AVIVA PLUS test strip Use as directed to check blood sugars 1 time per day dx: e11.22   . aspirin EC 81 MG tablet Take 81 mg by mouth daily.   Mary [name] Comfort Lancets 28G MISC    . Blood Glucose Monitoring Suppl (ACCU-CHEK AVIVA PLUS) w/Device KIT Use as directed to check blood sugars 2 times per day dx: e11.65   . cetirizine (ZYRTEC) 10 MG tablet TAKE 1 TABLET BY MOUTH EVERY DAY   . dorzolamide-timolol (COSOPT) 22.3-6.8 MG/ML ophthalmic solution Place 1 drop into the right eye 2 (two) times daily.   . fluorometholone (FML) 0.1 % ophthalmic suspension Place 1 drop into the right eye 3 (three) times daily. (Patient not taking: Reported on 09/02/2019)   . fluticasone (FLONASE) 50 MCG/ACT nasal spray SPRAY 1 SPRAY INTO EACH NOSTRIL EVERY DAY   . furosemide (LASIX) 40 MG tablet Take 1 tablet (40 mg total) by mouth daily.   Mary [name] HYDROcodone-acetaminophen (NORCO) 10-325 MG tablet Take 1 tablet  by mouth 5 (five) times daily as needed.   Mary [name] KLOR-CON M20 20 MEQ tablet Take 20 mEq by mouth daily. with food (Patient not taking: Reported on 09/02/2019) 02/23/2018: Patient instructed to hold this medication due to abnormal Potassium level of 6.2 on 02/18/18, per [name], Dr. Glendale Chard. Patient was given verbal education related to complications from hypo and hyperkalemia and importance of following [name] recommendations. Patient instructed to resume this medication when directed by PCP. Patient verbalizes understanding.   Mary [name] levothyroxine (SYNTHROID) 125 MCG tablet Take 125 mcg by mouth daily before breakfast.   . loratadine (CLARITIN) 10 MG tablet Take 10 mg by mouth daily. (Patient not taking: Reported on 09/02/2019)   . magnesium oxide (MAG-OX) 400 MG tablet TAKE 1 TABLET BY MOUTH EVERY EVENING   . metoprolol tartrate (LOPRESSOR) 50 MG tablet Take 50 mg by mouth 2 (two) times daily.    Mary [name] omeprazole (PRILOSEC) 20 MG capsule TAKE 1 CAPSULE BY MOUTH  DAILY   . pravastatin (PRAVACHOL) 40 MG tablet Take 1 tablet (40 mg total) by mouth daily.   . prednisoLONE acetate (PRED FORTE) 1 % ophthalmic suspension Place 1 drop into the right eye 3 (three) times daily. (Patient not taking: Reported on 09/02/2019)   . predniSONE (DELTASONE) 10 MG tablet Take by mouth. (Patient not taking: Reported on 09/02/2019)   . telmisartan (MICARDIS) 80 MG tablet TAKE 1 TABLET BY MOUTH  DAILY   . triamcinolone cream (KENALOG) 0.1 % APPLY TO AFFECTED  AREA TWICE A DAY    No facility-administered encounter medications on file as of 09/09/2019.     Objective:  Lab Results  Component Value Date   HGBA1C 5.0 04/06/2019   HGBA1C 5.1 06/24/2018   HGBA1C 4.9 02/18/2018   Lab Results  Component Value Date   MICROALBUR 30 11/18/2017   LDLCALC 65 11/16/2018   CREATININE 1.12 (H) 08/08/2019   BP Readings from Last 3 Encounters:  08/08/19 (!) 140/80  06/28/19 (!) 166/98  06/16/19 140/86    Goals Addressed      Patient Stated    .  "I would like to new power Scooter to get around better outside of my home" (pt-stated)   Not on track     [place] (see longitudinal plan of care for additional care plan information)  Current Barriers:  Mary [name] Knowledge Deficits related to determination for best DME to help improve mobility  . Chronic Disease Management support and education needs related to Diabetes Mellitus with stage 3 Chronic Kidney disease, Parenchymal renal hypertension  . Impaired Physical Mobility   Nurse Case Manager Clinical Goal(s):  Mary [name] Over the next 90 days, patient will verbalize understanding of plan for evaluation and determination for best DME to help improve patient's mobility, manual W/C, verses electric W/C, verses power Scooter   CCM RN CM Interventions:  09/09/19 call completed with patient  . Inter-disciplinary care team collaboration (see longitudinal plan of care) . Evaluation of current treatment plan related to Impaired Physical Mobility and DME needs and patient's adherence to plan as established by provider. . Advised patient to discuss her DME needs with PCP at next OV in order for a face to face evaluation to be completed and PCP help make recommendations for best mechanical mobility option: Determined patient will benefit from having a power W/C verses a Scooter due to having home modifications with the assistance of [employer], discussed this will allow her to be mobile inside and outside of her home including a W/C ramp; Encouraged patient to reach out to [facility] to determine her out of pocket expense for power W/C or Scooter . Provided education to patient re: options to purchase or rent to purchase a manual W/C; Provided patient with the contact number for [facility] and advised this DME supplier will also assist with a W/C ramp if needed for temporary use until [employer] can install permanent ramp; Determined patient will purchase a manual W/C out of pocket to allow  Medicare reimbursement for power W/C; Determined patient will contact this DME supplier for this purchase ASAP  . Discussed plans with patient for ongoing care management follow up and provided patient with direct contact information for care management team  Patient Self Care Activities:  . Self administers medications as prescribed . Attends all scheduled provider appointments . Calls pharmacy for medication refills . Performs ADL's independently . Performs IADL's independently . Calls provider office for new concerns or questions  Please see past updates related to this goal by clicking on the "Past Updates" button in the selected goal      .  "to get the swelling down in my legs" (pt-stated)   Not on track     [place] (see longitudinal plan of care for additional care plan information)  Current Barriers:  Mary [name] Knowledge Deficits related to evaluation and treatment of bilateral lymphedema  . Chronic Disease Management support and education needs related to Diabetes Mellitus with stage 3 Chronic Kidney disease,  Parenchymal renal hypertension   Nurse Case Manager Clinical Goal(s):  Mary [name] Over the next 30 days, patient will complete her new patient appointment with the [facility] for evaluation and treatment of lymphedema to her lower extremities  . Over the next 90 days, patient will work with the CCM team and PCP to address needs related to disease education and support to help improve Self Health management of Lymphedema  CCM RN CM Interventions:  09/09/19 call completed with patient  . Inter-disciplinary care team collaboration (see longitudinal plan of care) . Evaluation of current treatment plan related to right foot ulceration and bilateral lower leg lymphedema and patient's adherence to plan as established by provider . Determined patient is established with Dr. Cannon Kettle DPM for care of her right foot ulcer. Her last OV completed on 07/06/19 is noted with the  following Assessment/Plan:  . Problem List Items Addressed This Visit  .  Visit Diagnoses  .    Mary [name] Pre-ulcerative calluses    -  Primary .    Mary [name] Pain due to onychomycosis of toenails of both feet     .    Mary [name] PVD (peripheral vascular disease) ([place])     .    Mary [name] Foot ulcer, right, limited to breakdown of skin ([place])     .    Mary [name] Diabetic polyneuropathy associated with type 2 diabetes mellitus ([place])     .    . Lymphedema       . -Examined patient and discussed the progression of the pinpoint ulceration and chronic edema . -Mechanically dedbrided ulcerated callus and callus skin bilateral using a sterile chisel blade, no opening to right foot. Betadine applied to protect skin on the right cover with a Band-Aid and advised patient if she would benefit from lymphedema management with wound center as well as then helping to care for the right foot ulceration to prevent it from getting worse.  . -Continue with offloading padding to shoes bilateral . -At no additional charge mechanically debrided nails using sterile nail nipper without incident  . -Patient to return to office in 6 weeks for wound/callus check or sooner if problems arise . Determined patient completed a f/u visit at the [facility] on 07/27/19 for evaluation and treatment of lymphadema . Currently patient is wearing compression stockings and elevating extremities but reports lymphedema has worsened; Discussed RN from [place] will make a home visit on 09/13/19 . Collaborated with PCP Dr. Glendale Chard regarding patient's request to be evaluated for mechanical DME such as an electric W/C and or power Scooter; Advised of patient reported worsening lymphedema; Advised RN nurse visit from [place] is scheduled for 09/13/19; Discussed having a Remote Health referral placed for ongoing clinical support and advised PCP, sending a Remote Health referral will not delay or prevent services from Aging Gracefully, Advised patient will  need her PCP AWV rescheduled due to having previous visit cancelled for unknown reason    . Discussed plans with patient for ongoing care management follow up and provided patient with direct contact information for care management team  Patient Self Care Activities:  . Self administers medications as prescribed . Attends all scheduled provider appointments . Calls pharmacy for medication refills . Calls provider office for new concerns or questions  Please see past updates related to this goal by clicking on the "Past Updates" button in the selected goal        Plan:   Telephone follow  up appointment with care management team member scheduled for: 09/16/19  Barb Merino, RN, BSN, CCM Care Management Coordinator [place] Management/[facility]  Direct Phone: [phone]

## 2019-09-13 NOTE — Patient Instructions (Addendum)
Mary [name], it was so nice meeting you today!  Review the information below that we discussed today, and be sure to write any new strategies that you come up with down, so we can talk about them with our next visit.  I have scheduled our next visit as a phone call-- I will call you on Tuesday October 12, 2019, between 3-4:00 pm-- please listen out for my phone call-- I look forward to hearing an update from you then!   CLIENT/RN ACTION PLAN - FALL PREVENTION  Registered Nurse:  [name] RN Date: 09/13/2019  Client Name: Mary [name] Client ID: [id number]   Target Area:  FALL PREVENTION    Why Problem May Occur: Mobility issues due to chronic pain from arthritis, new diagnosis of lymphedema, decreased vision   Target Goal: "I don't want to have any more falls"   STRATEGIES Coping Strategies Ideas  Change Positions Slowly: lying to sitting, sitting to standing . Changing positions can make people lightheaded. . When getting up in the morning sit for a few minutes, before standing. . Continue using your walker . Continue working with Proofreader at your primary care office to obtain the equipment you need at home, through [employer]  . Continue to take your time with all activity: DON'T RUSH!!! . Wear good shoes with sturdy soles . Stand for a few minutes before walking and hold on to sturdy furniture or countertop.  .   Drink water . Dehydration can make people dizzy. . Ask your healthcare provider how much water you can drink. . Decrease caffeine, caffeine makes you urinate a lot, which can make you dehydrated. .   If you fall, tell someone . Tell a friend or family member even if you didn't get hurt. . Tell your healthcare provider if you fall.  They can help you figure out why. . Continue to wear your life-alert system at all times . Write any falls that you have down on your calendar and add what you think caused you to fall  Get your vision and hearing  checked . Poor vision and hearing loss can make people fall. . Glaucoma and diabetes can cause poor vision. . Continue attending your routine eye provider appointments  Other .    Prevention Ideas  Review your Medicines:  Your use of blood pressure medication and pain medication could make you feel light-headed or dizzy; if you experience this, let your doctor know right away . Many medicines can make you dizzy or sleepy and increase your risk of falling. . Your [facility] Nurse will look at your medications and see if you are taking any that might cause falling. . Use your pill box to keep your medications organized; fill it up weekly, using the method we discussed  Activity and Exercise- try to start doing some of the Aging [facility]fully exercises that we went over during our visit -- start gradually and slowly build up the amount you do -- don't ever over-do exercise or exercise so much that it causes you pain -- always maintain good posture with your body mechanics . A[facility]fully exercises . Walking (inside or outside) . Dancing . Gardening . Housework:  cooking, Education officer, environmental, Pharmacologist . Exercise while watching TV . Swimming or water aerobics.   Strengthen Bones . Exercise makes your bones stronger. . Vitamin D and Calcium make your bones stronger.  Ask your Healthcare provider if you should be taking them. . Your body makes vitamin D  from the sun.  Sit in the sun for 10 minutes every day (without sunscreen). .   Control your urine  . Prevent constipation . Cut back on caffeinated drinks. . Don't wait to urinate. . If diabetic, control your blood sugar. . Ask your [facility] Nurse and Healthcare Provider  about urine control. .   Control your blood sugar (if you are diabetic) . High blood sugar can cause frequent urination, poor vision, and numbness in your feet, which can make you fall.   . Low blood sugar can cause confusion and dizziness. .   Other coping strategies  1. 2. 3. 4. 5.   PRACTICE It is important to practice the strategies so we can determine if they will be effective in helping to reach the goal.    Follow these specific recommendations:  If you come up with any additional strategies that keep you from falling, write them down so we can talk about them the next time we talk      If strategy does not work the first time, try it again.     We may make some changes over the next few sessions.      Caryl Pina, RN, BSN, [facility] Care Management  [phone]

## 2019-09-14 ENCOUNTER — Telehealth: Payer: Self-pay

## 2019-09-14 NOTE — Patient Instructions (Signed)
Visit Information  Goals Addressed      Patient Stated   .  "I would like to new power Scooter to get around better outside of my home" (pt-stated)   Not on track     CARE PLAN ENTRY (see longitudinal plan of care for additional care plan information)  Current Barriers:  [name] Knowledge Deficits related to determination for best DME to help improve mobility  . Chronic Disease Management support and education needs related to Diabetes Mellitus with stage 3 Chronic Kidney disease, Parenchymal renal hypertension  . Impaired Physical Mobility   Nurse Case Manager Clinical Goal(s):  [name] Over the next 90 days, patient will verbalize understanding of plan for evaluation and determination for best DME to help improve patient's mobility, manual W/C, verses electric W/C, verses power Scooter   CCM RN CM Interventions:  09/09/19 call completed with patient  . Inter-disciplinary care team collaboration (see longitudinal plan of care) . Evaluation of current treatment plan related to Impaired Physical Mobility and DME needs and patient's adherence to plan as established by provider. . Advised patient to discuss her DME needs with PCP at next OV in order for a face to face evaluation to be completed and PCP help make recommendations for best mechanical mobility option: Determined patient will benefit from having a power W/C verses a Scooter due to having home modifications with the assistance of [employer], discussed this will allow her to be mobile inside and outside of her home including a W/C ramp; Encouraged patient to reach out to [facility] to determine her out of pocket expense for power W/C or Scooter . Provided education to patient re: options to purchase or rent to purchase a manual W/C; Provided patient with the contact number for [facility] and advised this DME supplier will also assist with a W/C ramp if needed for temporary use until [employer] can install permanent ramp;  Determined patient will purchase a manual W/C out of pocket to allow Medicare reimbursement for power W/C; Determined patient will contact this DME supplier for this purchase ASAP  . Discussed plans with patient for ongoing care management follow up and provided patient with direct contact information for care management team  Patient Self Care Activities:  . Self administers medications as prescribed . Attends all scheduled provider appointments . Calls pharmacy for medication refills . Performs ADL's independently . Performs IADL's independently . Calls provider office for new concerns or questions  Please see past updates related to this goal by clicking on the "Past Updates" button in the selected goal      .  "not to have anymore falls" (pt-stated)   Not on track     CARE PLAN ENTRY (see longitudinal plan of care for additional care plan information)  Current Barriers:  [name] Knowledge Deficits related to evaluation for DME, specifically a power W/C . 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 Inter-disciplinary care team collaboration (see longitudinal plan of care) . Evaluation of current treatment plan related to Impaired Physical Mobility and patient's adherence to plan as established by provider. . Advised patient to  take her time when ambulating and use her walker at all times; Encouraged patient to keep her phone with her at all times and to make sure her floors are clutter free and hallways are well lit, provided other fall prevention tips to patient and discussed plan for DME (See other  goal)  . Discussed plans with patient for ongoing care management follow up and provided patient with direct contact information for care management team 09/13/19 Received the following update via in basket message from [name] RN with [facility];  o Hi Lawanna Kobus,   Just FYI, wanted to let you know that I completed my first [facility] initial RN assessment with client above; very nice lady.  We are working Aging [facility]fully goals around fall prevention.   While I was at her home, her son Loraine Leriche) contacted me by phone (through phone of one of her daughters that were there in her home with her) and told me that he believes the patient needs PT, possible short term SNF placement for rehabilitation, and orders for new wheelchair and lymphedema pump.  I explained my role in the [facility] program and encouraged he/ patient/ family to contact PCP office for needs-- patient appears completely engaged with you and she was very complimentary around all you have done to assist her.  She relayed to her son that you are working on options for her around all of above.  She does not appear to be interested in rehabilitation through short-term SNF placement-- but her son said that he and family were.   As you might guess, she is a high fall risk at home due to her mobility issues from her lymphedema-- and she is hoping that the home modifications will assist her in staying safe at home.   Thanks,  Caryl Pina, RN, BSN, [employer] Coordinator  [facility] Care Management  [phone]  . Forwarded message to PCP Dr. Dorothyann Peng and confirmed PCP plan for Remote Health referral and to also perform HSE and assess for power W/C   Patient Self Care Activities:  . Self administers medications as prescribed . Calls pharmacy for medication refills . Calls provider office for new concerns or questions . Is able to perform ADL's/IADL's with great difficuty  Initial goal  documentation     .  "to get the swelling down in my legs" (pt-stated)   Not on track     CARE PLAN ENTRY (see longitudinal plan of care for additional care plan information)  Current Barriers:  [name] Knowledge Deficits related to evaluation and treatment of bilateral lymphedema  . Chronic Disease Management support and education needs related to Diabetes Mellitus with stage 3 Chronic Kidney disease, Parenchymal renal hypertension   Nurse Case Manager Clinical Goal(s):  [name] Over the next 30 days, patient will complete her new patient appointment with the [facility] for evaluation and treatment of lymphedema to her lower extremities  . Over the next 90 days, patient will work with the CCM team and PCP to address needs related to disease education and support to help improve Self Health management of Lymphedema  CCM RN CM Interventions:  09/09/19 call completed with patient  . Inter-disciplinary care team collaboration (see longitudinal plan of care) . Evaluation of current treatment plan related to right foot ulceration and bilateral lower leg lymphedema and patient's adherence to plan as established by provider . Determined patient is established with Dr. Marylene Land DPM for care of  her right foot ulcer. Her last OV completed on 07/06/19 is noted with the following Assessment/Plan:  . Problem List Items Addressed This Visit  .  Visit Diagnoses  .    [name] Pre-ulcerative calluses    -  Primary .    [name] Pain due to onychomycosis of toenails of both feet     .    [name] PVD (peripheral vascular disease) (HCC)     .    [name] Foot ulcer, right, limited to breakdown of skin (HCC)     .    [name] Diabetic polyneuropathy associated with type 2 diabetes mellitus (HCC)     .    . Lymphedema       . -Examined patient and discussed the progression of the pinpoint ulceration and chronic edema . -Mechanically dedbrided ulcerated callus and callus skin bilateral using a sterile chisel blade, no opening to right  foot. Betadine applied to protect skin on the right cover with a Band-Aid and advised patient if she would benefit from lymphedema management with wound center as well as then helping to care for the right foot ulceration to prevent it from getting worse.  . -Continue with offloading padding to shoes bilateral . -At no additional charge mechanically debrided nails using sterile nail nipper without incident  . -Patient to return to office in 6 weeks for wound/callus check or sooner if problems arise . Determined patient completed a f/u visit at the [facility] on 07/27/19 for evaluation and treatment of lymphadema . Currently patient is wearing compression stockings and elevating extremities but reports lymphedema has worsened; Discussed RN from [facility] will make a home visit on 09/13/19 . Collaborated with PCP Dr. Dorothyann Peng regarding patient's request to be evaluated for mechanical DME such as an electric W/C and or power Scooter; Advised of patient reported worsening lymphedema; Advised RN nurse visit from [facility] is scheduled for 09/13/19; Discussed having a Remote Health referral placed for ongoing clinical support and advised PCP, sending a Remote Health referral will not delay or prevent services from [facility], Advised patient will need her PCP [facility] rescheduled due to having previous visit cancelled for unknown reason    . Discussed plans with patient for ongoing care management follow up and provided patient with direct contact information for care management team  Patient Self Care Activities:  . Self administers medications as prescribed . Attends all scheduled provider appointments . Calls pharmacy for medication refills . Calls provider office for new concerns or questions  Please see past updates related to this goal by clicking on the "Past Updates" button in the selected goal         Patient verbalizes understanding of instructions provided  today.   Telephone follow up appointment with care management team member scheduled for: 09/16/19  Delsa Sale, RN, BSN, CCM Care Management Coordinator [facility] Care Management/[facility]  Direct Phone: [phone]

## 2019-09-14 NOTE — Telephone Encounter (Signed)
I called [facility] remote because Dr. Allyne Gee wanted to know if they can add a note to their last visit they had to the patient needed a wheelchair, Dr. Grayling Congress said that the pt was doing well with a walker at the last visit with the pt.

## 2019-09-14 NOTE — Chronic Care Management (AMB) (Signed)
Chronic Care Management   Follow Up Note   09/07/2019 Name: Mary [name] MRN: [id number] DOB: [birth year]  Referred by: Glendale Chard, MD Reason for referral : Chronic Care Management (FU RN CM Call )   Mary [name] is a 74 y.o. year old female who is a primary care patient of Glendale Chard, MD. The CCM team was consulted for assistance with chronic disease management and care coordination needs.    Review of patient status, including review of consultants reports, relevant laboratory and other test results, and collaboration with appropriate care team members and the patient's provider was performed as part of comprehensive patient evaluation and provision of chronic care management services.    SDOH (Social Determinants of Health) assessments performed: Yes See Care Plan activities for detailed interventions related to [place])   Placed outbound call to patient for CCM RN CM care plan update.     Outpatient Encounter Medications as of 09/07/2019  Medication Sig Note  . ACCU-CHEK AVIVA PLUS test strip Use as directed to check blood sugars 1 time per day dx: e11.22   . aspirin EC 81 MG tablet Take 81 mg by mouth daily.   [name] 28G MISC    . Blood Glucose Monitoring Suppl (ACCU-CHEK AVIVA PLUS) w/Device KIT Use as directed to check blood sugars 2 times per day dx: e11.65   . cetirizine (ZYRTEC) 10 MG tablet TAKE 1 TABLET BY MOUTH EVERY DAY   . dorzolamide-timolol (COSOPT) 22.3-6.8 MG/ML ophthalmic solution Place 1 drop into the right eye 2 (two) times daily.   . fluorometholone (FML) 0.1 % ophthalmic suspension Place 1 drop into the right eye 3 (three) times daily. (Patient not taking: Reported on 09/02/2019)   . fluticasone (FLONASE) 50 MCG/ACT nasal spray SPRAY 1 SPRAY INTO EACH NOSTRIL EVERY DAY   . furosemide (LASIX) 40 MG tablet Take 1 tablet (40 mg total) by mouth daily.   [name] HYDROcodone-acetaminophen (NORCO) 10-325 MG tablet Take 1 tablet by mouth 5 (five)  times daily as needed.   [name] KLOR-CON M20 20 MEQ tablet Take 20 mEq by mouth daily. with food (Patient not taking: Reported on 09/02/2019) 02/23/2018: Patient instructed to hold this medication due to abnormal Potassium level of 6.2 on 02/18/18, per MD, Dr. Glendale Chard. Patient was given verbal education related to complications from hypo and hyperkalemia and importance of following MD recommendations. Patient instructed to resume this medication when directed by PCP. Patient verbalizes understanding.   [name] levothyroxine (SYNTHROID) 125 MCG tablet Take 125 mcg by mouth daily before breakfast.   . loratadine (CLARITIN) 10 MG tablet Take 10 mg by mouth daily. (Patient not taking: Reported on 09/02/2019)   . magnesium oxide (MAG-OX) 400 MG tablet TAKE 1 TABLET BY MOUTH EVERY EVENING   . metoprolol tartrate (LOPRESSOR) 50 MG tablet Take 50 mg by mouth 2 (two) times daily.    [name] omeprazole (PRILOSEC) 20 MG capsule TAKE 1 CAPSULE BY MOUTH  DAILY   . pravastatin (PRAVACHOL) 40 MG tablet Take 1 tablet (40 mg total) by mouth daily.   . prednisoLONE acetate (PRED FORTE) 1 % ophthalmic suspension Place 1 drop into the right eye 3 (three) times daily. (Patient not taking: Reported on 09/02/2019)   . predniSONE (DELTASONE) 10 MG tablet Take by mouth. (Patient not taking: Reported on 09/02/2019)   . telmisartan (MICARDIS) 80 MG tablet TAKE 1 TABLET BY MOUTH  DAILY   . triamcinolone cream (KENALOG) 0.1 % APPLY TO AFFECTED AREA TWICE  A DAY    No facility-administered encounter medications on file as of 09/07/2019.     Objective:  Lab Results  Component Value Date   HGBA1C 5.0 04/06/2019   HGBA1C 5.1 06/24/2018   HGBA1C 4.9 02/18/2018   Lab Results  Component Value Date   MICROALBUR 30 11/18/2017   LDLCALC 65 11/16/2018   CREATININE 1.12 (H) 08/08/2019   BP Readings from Last 3 Encounters:  08/08/19 (!) 140/80  06/28/19 (!) 166/98  06/16/19 140/86    Goals Addressed      Patient Stated   .  "I would like  to new power Scooter to get around better outside of my home" (pt-stated)   Not on track     [place] (see longitudinal plan of care for additional care plan information)  Current Barriers:  [name] Knowledge Deficits related to determination for best DME to help improve mobility  . Chronic Disease Management support and education needs related to Diabetes Mellitus with stage 3 Chronic Kidney disease, Parenchymal renal hypertension  . Impaired Physical Mobility   Nurse Case Manager Clinical Goal(s):  [name] Over the next 90 days, patient will verbalize understanding of plan for evaluation and determination for best DME to help improve patient's mobility, manual W/C, verses electric W/C, verses power Scooter   CCM RN CM Interventions:  09/09/19 call completed with patient  . Inter-disciplinary care team collaboration (see longitudinal plan of care) . Evaluation of current treatment plan related to Impaired Physical Mobility and DME needs and patient's adherence to plan as established by provider. . Advised patient to discuss her DME needs with PCP at next OV in order for a face to face evaluation to be completed and PCP help make recommendations for best mechanical mobility option: Determined patient will benefit from having a power W/C verses a Scooter due to having home modifications with the assistance of [employer], discussed this will allow her to be mobile inside and outside of her home including a W/C ramp; Encouraged patient to reach out to [facility] to determine her out of pocket expense for power W/C or Scooter . Provided education to patient re: options to purchase or rent to purchase a manual W/C; Provided patient with the contact number for [facility] and advised this DME supplier will also assist with a W/C ramp if needed for temporary use until [employer] can install permanent ramp; Determined patient will purchase a manual W/C out of pocket to allow Medicare  reimbursement for power W/C; Determined patient will contact this DME supplier for this purchase ASAP  . Discussed plans with patient for ongoing care management follow up and provided patient with direct contact information for care management team  Patient Self Care Activities:  . Self administers medications as prescribed . Attends all scheduled provider appointments . Calls pharmacy for medication refills . Performs ADL's independently . Performs IADL's independently . Calls provider office for new concerns or questions  Please see past updates related to this goal by clicking on the "Past Updates" button in the selected goal      .  "not to have anymore falls" (pt-stated)   Not on track     [place] (see longitudinal plan of care for additional care plan information)  Current Barriers:  [name] Knowledge Deficits related to evaluation for DME, specifically a power W/C . Chronic Disease Management support and education needs related to Diabetes Mellitus with stage 3 Chronic Kidney disease, Parenchymal renal hypertension  Nurse Case Manager Clinical Goal(s):  [name] Over the next 90 days, patient will work with the CCM team and PCP to address needs related to DME needs to improve Impaired Physical Mobility, Impaired Gait, and Fall Prevention   CCM RN CM Interventions:  09/07/19 call completed with patient  . Inter-disciplinary care team collaboration (see longitudinal plan of care) . Evaluation of current treatment plan related to Impaired Physical Mobility and patient's adherence to plan as established by provider . Advised patient to take her time when ambulating and use her walker at all times; Encouraged patient to keep her phone with her at all times and to make sure her floors are clutter free and hallways are well lit, provided other fall prevention tips to patient and discussed plan for DME (See other goal)  . Discussed plans with patient for ongoing care management follow up  and provided patient with direct contact information for care management team 09/13/19 Received the following update via in basket message from [place] with [facility];  Hi Calhoun Reichardt,   Just FYI, wanted to let you know that I completed my first [facility] initial RN assessment with client above; very nice lady.  We are working [facility] goals around fall prevention.   While I was at her home, her son Elta Gu[place]oupe) contacted me by phone (through phone of one of her daughters that were there in her home with her) and told me that he believes the patient needs PT, possible short term SNF placement for rehabilitation, and orders for new wheelchair and lymphedema pump.  I explained my role in the [facility] program and encouraged he/ patient/ family to contact PCP office for needs-- patient appears completely engaged with you and she was very complimentary around all you have done to assist her.  She relayed to her son that you are working on options for her around all of above.  She does not appear to be interested in rehabilitation through short-term SNF placement-- but her son said that he and family were.   As you might guess, she is a high fall risk at home due to her mobility issues from her lymphedema-- and she is hoping that the home modifications will assist her in staying safe at home.   Thanks,  [name], RN, BSN, [place] [facility] [phone]  . Forwarded message to PCP Dr. Glendale Chard and confirmed PCP plan for [facility] referral and to also perform HSE and assess for power W/C   Patient Self Care Activities:  . Self administers medications as prescribed . Calls pharmacy for medication refills . Calls provider office for new concerns or questions . Is able to perform ADL's/IADL's with great difficuty  Initial goal documentation     .  "to get the swelling down in my legs" (pt-stated)   Not on  track     [place] (see longitudinal plan of care for additional care plan information)  Current Barriers:  [name] Knowledge Deficits related to evaluation and treatment of bilateral lymphedema  . Chronic Disease Management support and education needs related to Diabetes Mellitus with stage 3 Chronic Kidney disease, Parenchymal renal hypertension   Nurse Case Manager Clinical Goal(s):  [name] Over the next 30 days, patient will complete her new patient appointment with the [facility] for evaluation and treatment of lymphedema to her lower extremities  . Over the next 90 days, patient will  work with the CCM team and PCP to address needs related to disease education and support to help improve Self Health management of Lymphedema  CCM RN CM Interventions:  09/09/19 call completed with patient  . Inter-disciplinary care team collaboration (see longitudinal plan of care) . Evaluation of current treatment plan related to right foot ulceration and bilateral lower leg lymphedema and patient's adherence to plan as established by provider . Determined patient is established with Dr. Cannon Kettle DPM for care of her right foot ulcer. Her last OV completed on 07/06/19 is noted with the following Assessment/Plan:  . Problem List Items Addressed This Visit  .  Visit Diagnoses  .    [name] Pre-ulcerative calluses    -  Primary .    [name] Pain due to onychomycosis of toenails of both feet     .    [name] PVD (peripheral vascular disease) ([place])     .    [name] Foot ulcer, right, limited to breakdown of skin ([place])     .    [name] Diabetic polyneuropathy associated with type 2 diabetes mellitus ([place])     .    . Lymphedema       . -Examined patient and discussed the progression of the pinpoint ulceration and chronic edema . -Mechanically dedbrided ulcerated callus and callus skin bilateral using a sterile chisel blade, no opening to right foot. Betadine applied to protect skin on the right cover with a Band-Aid and advised  patient if she would benefit from lymphedema management with wound center as well as then helping to care for the right foot ulceration to prevent it from getting worse.  . -Continue with offloading padding to shoes bilateral . -At no additional charge mechanically debrided nails using sterile nail nipper without incident  . -Patient to return to office in 6 weeks for wound/callus check or sooner if problems arise . Determined patient completed a f[facility] on 07/[facility] for evaluation and treatment of lymphadema . Currently patient is wearing compression stockings and elevating extremities but reports lymphedema has worsened; Discussed RN from [place] will make a home visit on 09/13/19 . Collaborated with PCP Dr. Glendale Chard regarding patient's request to be evaluated for mechanical DME such as an electric W/C and or power Scooter; Advised of patient reported worsening lymphedema; Advised RN nurse visit from [place] is scheduled for 09/13/19; Discussed having a [facility] referral placed for ongoing clinical support and advised PCP, sending a [facility] referral will not delay or prevent services from [facility], Advised patient will need her PCP AWV rescheduled due to having previous visit cancelled for unknown reason    . Discussed plans with patient for ongoing care management follow up and provided patient with direct contact information for care management team  Patient Self Care Activities:  . Self administers medications as prescribed . Attends all scheduled provider appointments . Calls pharmacy for medication refills . Calls provider office for new concerns or questions  Please see past updates related to this goal by clicking on the "Past Updates" button in the selected goal         Plan:   Telephone follow up appointment with care management team member scheduled for: 09/16/19  Barb Merino, RN, BSN, CCM Care Management  Coordinator [place] [facility]  Direct Phone: [phone]

## 2019-09-15 ENCOUNTER — Ambulatory Visit: Payer: Medicare Other

## 2019-09-15 DIAGNOSIS — E1122 Type 2 diabetes mellitus with diabetic chronic kidney disease: Secondary | ICD-10-CM

## 2019-09-15 DIAGNOSIS — N183 Chronic kidney disease, stage 3 unspecified: Secondary | ICD-10-CM

## 2019-09-15 NOTE — Chronic Care Management (AMB) (Signed)
Chronic Care Management    Social Work Follow Up Note  09/15/2019 Name: Mary [name] MRN: [id number] DOB: Jun 10, 1945  Mary [name] is a 74 y.o. year old female who is a primary care patient of Glendale Chard, MD. The CCM team was consulted for assistance with care coordination.   Review of patient status, including review of consultants reports, other relevant assessments, and collaboration with appropriate care team members and the patient's provider was performed as part of comprehensive patient evaluation and provision of chronic care management services.    SDOH (Social Determinants of Health) assessments performed: No    Outpatient Encounter Medications as of 09/15/2019  Medication Sig Note  . ACCU-CHEK AVIVA PLUS test strip Use as directed to check blood sugars 1 time per day dx: e11.22   . aspirin EC 81 MG tablet Take 81 mg by mouth daily.   [name] 28G MISC    . Blood Glucose Monitoring Suppl (ACCU-CHEK AVIVA PLUS) w/Device KIT Use as directed to check blood sugars 2 times per day dx: e11.65   . cetirizine (ZYRTEC) 10 MG tablet TAKE 1 TABLET BY MOUTH EVERY DAY   . dorzolamide-timolol (COSOPT) 22.3-6.8 MG/ML ophthalmic solution Place 1 drop into the right eye 2 (two) times daily.   . fluorometholone (FML) 0.1 % ophthalmic suspension Place 1 drop into the right eye 3 (three) times daily. (Patient not taking: Reported on 09/02/2019)   . fluticasone (FLONASE) 50 MCG/ACT nasal spray SPRAY 1 SPRAY INTO EACH NOSTRIL EVERY DAY   . furosemide (LASIX) 40 MG tablet Take 1 tablet (40 mg total) by mouth daily.   [name] HYDROcodone-acetaminophen (NORCO) 10-325 MG tablet Take 1 tablet by mouth 5 (five) times daily as needed.   [name] KLOR-CON M20 20 MEQ tablet Take 20 mEq by mouth daily. with food (Patient not taking: Reported on 09/02/2019) 02/23/2018: Patient instructed to hold this medication due to abnormal Potassium level of 6.2 on 02/18/18, per MD, Dr. Glendale Chard. Patient was given  verbal education related to complications from hypo and hyperkalemia and importance of following MD recommendations. Patient instructed to resume this medication when directed by PCP. Patient verbalizes understanding.   [name] levothyroxine (SYNTHROID) 125 MCG tablet Take 125 mcg by mouth daily before breakfast.   . loratadine (CLARITIN) 10 MG tablet Take 10 mg by mouth daily. (Patient not taking: Reported on 09/02/2019)   . magnesium oxide (MAG-OX) 400 MG tablet TAKE 1 TABLET BY MOUTH EVERY EVENING   . metoprolol tartrate (LOPRESSOR) 50 MG tablet Take 50 mg by mouth 2 (two) times daily.    [name] omeprazole (PRILOSEC) 20 MG capsule TAKE 1 CAPSULE BY MOUTH  DAILY   . pravastatin (PRAVACHOL) 40 MG tablet Take 1 tablet (40 mg total) by mouth daily.   . prednisoLONE acetate (PRED FORTE) 1 % ophthalmic suspension Place 1 drop into the right eye 3 (three) times daily. (Patient not taking: Reported on 09/02/2019)   . predniSONE (DELTASONE) 10 MG tablet Take by mouth. (Patient not taking: Reported on 09/02/2019)   . telmisartan (MICARDIS) 80 MG tablet TAKE 1 TABLET BY MOUTH  DAILY   . triamcinolone cream (KENALOG) 0.1 % APPLY TO AFFECTED AREA TWICE A DAY    No facility-administered encounter medications on file as of 09/15/2019.     Goals Addressed              This Visit's Progress   Other   .  "my mom needs rehab"  Daughter Stated  CARE PLAN ENTRY (see longitudinal plan of care for additional care plan information)  Current Barriers:  . Ongoing cellulitis infection limiting patient ability to perform ADL's on her own . Inability to maneuver around the home due to decreased ambulation ability and insufficient DME in the home (see alternative goal's regarding obtaining DME) . Limited access to caregiver . Lacks knowledge of how to access care in a rehab facility . Chronic conditions including DM II and CKD III which put patient at increased risk for hospitalization  Social Work Clinical Goal(s):    . Over the next 10 days the patient and her daughter will work with embedded care management team to address concerns surrounding ADL limitations  CCM SW Interventions: Completed 09/15/19 with the patients daughter Mary [name] . Inter-disciplinary care team collaboration (see longitudinal plan of care) . Inbound call received from the patients daughter who reports the patient needs inpatient rehab and she would like SW assistance with placement . Determined the patient continues to have difficulty with ambulation in the home o The patient has dealt with a cellulitis infection which is causing edema to the lower extremities o The patient does not own a wheel chair but has been working with RN Care Manager to obtain DME in the home . Provided education that due to Medicare guidelines, the patients health plan will not cover admission to a SNF for rehab without a 3 mid-night hospital admission o Patient seen in ED 7/25 for leg swelling, patients daughter reports this swelling has not worsened . Advised Mary, SW will collaborate with RN Care Manager and Dr. Sanders to determine available options to the patient . Collaboration with Mary Little, RN Care Manager who discussed recent referral to [facility] for in home care needs . Outbound call placed to [facility], spoke with Mary [name] who confirms receipt of referral sent on 8/26. No visit is scheduled at this time o Mary [name] advised SW she will reach out to her leadership team for more information and contact SW later in the day . Collaboration with Dr. Sanders to inform of the patients daughters request and current barriers o Dr. Sanders agrees with awaiting for return call from [facility] . Received voice message from Mary [name] with [facility] stating she attempted to schedule an intake appointment with the patient but was unsuccessful.  o SW placed an unsuccessful outbound call to Mary [name], SW left voice message providing  contact number to the patients daughter as well . Successful outbound call placed to the patients daughter to provide updates on above interventions o Determined the patient has been in contact with [facility] and has an appointment scheduled for 9/2 at 1:00 pm . Scheduled follow up call over the next week to assess goal progression  Patient Self Care Activities:  . Attends all scheduled provider appointments . Calls provider office for new concerns or questions . Unable to perform ADLs independently . Unable to perform IADLs independently  Initial goal documentation         Follow Up Plan: SW will follow up with patient by phone over the next week.   Kendra Humble, BSW, CDP Social Worker, Certified Dementia Practitioner [facility] Care Management [phone]  Total time spent performing care coordination and/or care management activities with the patient by phone or face to face = 45 minutes.      

## 2019-09-15 NOTE — Patient Instructions (Signed)
Social Worker Visit Information  Goals we discussed today:  Goals Addressed              This Visit's Progress   Other   .  "my mom needs rehab"        Daughter Stated  CARE PLAN ENTRY (see longitudinal plan of care for additional care plan information)  Current Barriers:  . Ongoing cellulitis infection limiting patient ability to perform ADL's on her own . Inability to maneuver around the home due to decreased ambulation ability and insufficient DME in the home (see alternative goal's regarding obtaining DME) . Limited access to caregiver . Lacks knowledge of how to access care in a rehab facility . Chronic conditions including DM II and CKD III which put patient at increased risk for hospitalization  Social Work Clinical Goal(s):  [name] Over the next 10 days the patient and her daughter will work with embedded care management team to address concerns surrounding ADL limitations  CCM SW Interventions: Completed 09/15/19 with the patients daughter Fatima Fedie . Inter-disciplinary care team collaboration (see longitudinal plan of care) . Inbound call received from the patients daughter who reports the patient needs inpatient rehab and she would like SW assistance with placement . Determined the patient continues to have difficulty with ambulation in the home o The patient has dealt with a cellulitis infection which is causing edema to the lower extremities o The patient does not own a wheel chair but has been working with Medical illustrator to obtain DME in the home . Provided education that due to Medicare guidelines, the patients health plan will not cover admission to a SNF for rehab without a 3 mid-night hospital admission o Patient seen in ED 7/25 for leg swelling, patients daughter reports this swelling has not worsened . Campbell Stall, SW will collaborate with RN Care Manager and Dr. Allyne Gee to determine available options to the patient . Collaboration with Delsa Sale, RN  Care Manager who discussed recent referral to [facility] for in home care needs . Outbound call placed to [facility], spoke with Revonda Standard who confirms receipt of referral sent on 8/26. No visit is scheduled at this time o Revonda Standard advised SW she will reach out to her leadership team for more information and contact SW later in the day . Collaboration with Dr. Allyne Gee to inform of the patients daughters request and current barriers o Dr. Allyne Gee agrees with awaiting for return call from [facility] . Received voice message from Cindra Eves with [facility] stating she attempted to schedule an intake appointment with the patient but was unsuccessful.  o SW placed an unsuccessful outbound call to Mrs. Darcella Gasman, SW left voice message providing contact number to the patients daughter as well . Successful outbound call placed to the patients daughter to provide updates on above interventions o Determined the patient has been in contact with [facility] and has an appointment scheduled for 9/2 at 1:00 pm . Scheduled follow up call over the next week to assess goal progression  Patient Self Care Activities:  . Attends all scheduled provider appointments . Calls provider office for new concerns or questions . Unable to perform ADLs independently . Unable to perform IADLs independently  Initial goal documentation         Materials Provided: Verbal education about [facility] provided by phone  Follow Up Plan: SW will follow up with patient by phone over the next week   Bevelyn Ngo, BSW, CDP Social Worker,  Certified Dementia Practitioner [facility] Care Management [phone]

## 2019-09-16 ENCOUNTER — Telehealth: Payer: Self-pay

## 2019-09-16 ENCOUNTER — Telehealth: Payer: Medicare Other

## 2019-09-16 NOTE — Telephone Encounter (Cosign Needed)
  Chronic Care Management   Outreach Note  09/16/2019 Name: Mary [name] MRN: [id number] DOB: [birth year]  Referred by: Dorothyann Peng, MD Reason for referral : Chronic Care Management (FU RN CM Call )   An unsuccessful telephone outreach was attempted today. The patient was referred to the case management team for assistance with care management and care coordination.   Follow Up Plan: A HIPPA compliant phone message was left for the patient providing contact information and requesting a return call.   Delsa Sale, RN, BSN, CCM Care Management Coordinator [facility] Care Management/[facility]  Direct Phone: [phone]

## 2019-09-17 ENCOUNTER — Ambulatory Visit: Payer: Medicare Other

## 2019-09-17 ENCOUNTER — Encounter (INDEPENDENT_AMBULATORY_CARE_PROVIDER_SITE_OTHER): Payer: Medicare Other | Admitting: Ophthalmology

## 2019-09-17 DIAGNOSIS — N183 Chronic kidney disease, stage 3 unspecified: Secondary | ICD-10-CM

## 2019-09-17 DIAGNOSIS — E1122 Type 2 diabetes mellitus with diabetic chronic kidney disease: Secondary | ICD-10-CM

## 2019-09-17 DIAGNOSIS — R600 Localized edema: Secondary | ICD-10-CM

## 2019-09-17 NOTE — Chronic Care Management (AMB) (Signed)
Chronic Care Management    Social Work Follow Up Note  09/17/2019 Name: Mary [name] MRN: [id number] DOB: [birth year]  Mary [name] is a 74 y.o. year old female who is a primary care patient of Glendale Chard, MD. The CCM team was consulted for assistance with care coordination.   Review of patient status, including review of consultants reports, other relevant assessments, and collaboration with appropriate care team members and the patient's provider was performed as part of comprehensive patient evaluation and provision of chronic care management services.    SDOH (Social Determinants of Health) assessments performed: No    Outpatient Encounter Medications as of 09/17/2019  Medication Sig Note  . ACCU-CHEK AVIVA PLUS test strip Use as directed to check blood sugars 1 time per day dx: e11.22   . aspirin EC 81 MG tablet Take 81 mg by mouth daily.   [name] 28G MISC    . Blood Glucose Monitoring Suppl (ACCU-CHEK AVIVA PLUS) w/Device KIT Use as directed to check blood sugars 2 times per day dx: e11.65   . cetirizine (ZYRTEC) 10 MG tablet TAKE 1 TABLET BY MOUTH EVERY DAY   . dorzolamide-timolol (COSOPT) 22.3-6.8 MG/ML ophthalmic solution Place 1 drop into the right eye 2 (two) times daily.   . fluorometholone (FML) 0.1 % ophthalmic suspension Place 1 drop into the right eye 3 (three) times daily. (Patient not taking: Reported on 09/02/2019)   . fluticasone (FLONASE) 50 MCG/ACT nasal spray SPRAY 1 SPRAY INTO EACH NOSTRIL EVERY DAY   . furosemide (LASIX) 40 MG tablet Take 1 tablet (40 mg total) by mouth daily.   [name] HYDROcodone-acetaminophen (NORCO) 10-325 MG tablet Take 1 tablet by mouth 5 (five) times daily as needed.   [name] KLOR-CON M20 20 MEQ tablet Take 20 mEq by mouth daily. with food (Patient not taking: Reported on 09/02/2019) 02/23/2018: Patient instructed to hold this medication due to abnormal Potassium level of 6.2 on 02/18/18, per MD, Dr. Glendale Chard. Patient was given  verbal education related to complications from hypo and hyperkalemia and importance of following MD recommendations. Patient instructed to resume this medication when directed by PCP. Patient verbalizes understanding.   [name] levothyroxine (SYNTHROID) 125 MCG tablet Take 125 mcg by mouth daily before breakfast.   . loratadine (CLARITIN) 10 MG tablet Take 10 mg by mouth daily. (Patient not taking: Reported on 09/02/2019)   . magnesium oxide (MAG-OX) 400 MG tablet TAKE 1 TABLET BY MOUTH EVERY EVENING   . metoprolol tartrate (LOPRESSOR) 50 MG tablet Take 50 mg by mouth 2 (two) times daily.    [name] omeprazole (PRILOSEC) 20 MG capsule TAKE 1 CAPSULE BY MOUTH  DAILY   . pravastatin (PRAVACHOL) 40 MG tablet Take 1 tablet (40 mg total) by mouth daily.   . prednisoLONE acetate (PRED FORTE) 1 % ophthalmic suspension Place 1 drop into the right eye 3 (three) times daily. (Patient not taking: Reported on 09/02/2019)   . predniSONE (DELTASONE) 10 MG tablet Take by mouth. (Patient not taking: Reported on 09/02/2019)   . telmisartan (MICARDIS) 80 MG tablet TAKE 1 TABLET BY MOUTH  DAILY   . triamcinolone cream (KENALOG) 0.1 % APPLY TO AFFECTED AREA TWICE A DAY    No facility-administered encounter medications on file as of 09/17/2019.     Goals Addressed            This Visit's Progress   . "my mom needs rehab"       Daughter Stated  CARE  PLAN ENTRY (see longitudinal plan of care for additional care plan information)  Current Barriers:  . Ongoing cellulitis infection limiting patient ability to perform ADL's on her own . Inability to maneuver around the home due to decreased ambulation ability and insufficient DME in the home (see alternative goal's regarding obtaining DME) . Limited access to caregiver . Lacks knowledge of how to access care in a rehab facility . Chronic conditions including DM II and CKD III which put patient at increased risk for hospitalization  Social Work Clinical Goal(s):  [name] Over the  next 10 days the patient and her daughter will work with embedded care management team to address concerns surrounding ADL limitations  CCM SW Interventions: Completed 09/17/19 with Larena Glassman RN from [place] . Inbound call received from [place] with [facility] o Patient admitted to [facility] 09/16/19 o Larena Glassman reports the patient is unable to leave her home safely and would benefit from attending a Lymphedema Clinic o Discussed the patient is active with [employer] under "Aging Gracefully" program o [place] would [employer] to gather information on home modification timeline . Unsuccessful outbound call placed to Darylene Price with [employer] o Voice message left requesting a return call . Secure e-mail collaboration with Gene Owens Shark with [employer] to request feedback surrounding ramp installation timeframe . SW will follow up with Larena Glassman over the next week  Completed 09/15/19 with the patients daughter Saundra Gin . Inter-disciplinary care team collaboration (see longitudinal plan of care) . Inbound call received from the patients daughter who reports the patient needs inpatient rehab and she would like SW assistance with placement . Determined the patient continues to have difficulty with ambulation in the home o The patient has dealt with a cellulitis infection which is causing edema to the lower extremities o The patient does not own a wheel chair but has been working with Consulting civil engineer to obtain DME in the home . Provided education that due to Medicare guidelines, the patients health plan will not cover admission to a SNF for rehab without a 3 mid-night hospital admission o Patient seen in ED 7/25 for leg swelling, patients daughter reports this swelling has not worsened . Ree Edman, SW will collaborate with RN Care Manager and Dr. Baird Cancer to determine available options to the  patient . Collaboration with Barb Merino, [place] who discussed recent referral to [facility] for in home care needs . Outbound call placed to [facility], spoke with Ebony Hail who confirms receipt of referral sent on 8/26. No visit is scheduled at this time o Ebony Hail advised SW she will reach out to her leadership team for more information and contact SW later in the day . Collaboration with Dr. Baird Cancer to inform of the patients daughters request and current barriers o Dr. Baird Cancer agrees with awaiting for return call from [place] . Received voice message from Reche Dixon with [facility] stating she attempted to schedule an intake appointment with the patient but was unsuccessful.  o SW placed an unsuccessful outbound call to Mrs. Beryl Meager, SW left voice message providing contact number to the patients daughter as well . Successful outbound call placed to the patients daughter to provide updates on above interventions o Determined the patient has been in contact with [facility] and has an appointment scheduled for 9/2 at 1:00 pm . SW will continue to folow  Patient Self Care Activities:  . Attends all scheduled  provider appointments . Calls provider office for new concerns or questions . Unable to perform ADLs independently . Unable to perform IADLs independently  Please see past updates related to this goal by clicking on the "Past Updates" button in the selected goal          Follow Up Plan: SW will follow up with patient by phone over the next week.   [name], BSW, CDP Social Worker, Certified Dementia Practitioner [place] / [facility] [phone]  Total time spent performing care coordination and/or care management activities with the patient by phone or face to face = 10 minutes.

## 2019-09-21 ENCOUNTER — Ambulatory Visit: Payer: Medicare Other

## 2019-09-21 DIAGNOSIS — E1122 Type 2 diabetes mellitus with diabetic chronic kidney disease: Secondary | ICD-10-CM

## 2019-09-21 DIAGNOSIS — N183 Chronic kidney disease, stage 3 unspecified: Secondary | ICD-10-CM

## 2019-09-21 DIAGNOSIS — R600 Localized edema: Secondary | ICD-10-CM

## 2019-09-21 NOTE — Chronic Care Management (AMB) (Signed)
Chronic Care Management    Social Work Follow Up Note  09/21/2019 Name: DARYL QUIROS MRN: [id number] DOB: 1945/11/11  ANTONEA GAUT is a 74 y.o. year old female who is a primary care patient of Glendale Chard, MD. The CCM team was consulted for assistance with care coordination.   Review of patient status, including review of consultants reports, other relevant assessments, and collaboration with appropriate care team members and the patient's provider was performed as part of comprehensive patient evaluation and provision of chronic care management services.    SDOH (Social Determinants of Health) assessments performed: No    Outpatient Encounter Medications as of 09/21/2019  Medication Sig Note  . ACCU-CHEK AVIVA PLUS test strip Use as directed to check blood sugars 1 time per day dx: e11.22   . aspirin EC 81 MG tablet Take 81 mg by mouth daily.   [name] 28G MISC    . Blood Glucose Monitoring Suppl (ACCU-CHEK AVIVA PLUS) w/Device KIT Use as directed to check blood sugars 2 times per day dx: e11.65   . cetirizine (ZYRTEC) 10 MG tablet TAKE 1 TABLET BY MOUTH EVERY DAY   . dorzolamide-timolol (COSOPT) 22.3-6.8 MG/ML ophthalmic solution Place 1 drop into the right eye 2 (two) times daily.   . fluorometholone (FML) 0.1 % ophthalmic suspension Place 1 drop into the right eye 3 (three) times daily. (Patient not taking: Reported on 09/02/2019)   . fluticasone (FLONASE) 50 MCG/ACT nasal spray SPRAY 1 SPRAY INTO EACH NOSTRIL EVERY DAY   . furosemide (LASIX) 40 MG tablet Take 1 tablet (40 mg total) by mouth daily.   [name] HYDROcodone-acetaminophen (NORCO) 10-325 MG tablet Take 1 tablet by mouth 5 (five) times daily as needed.   [name] KLOR-CON M20 20 MEQ tablet Take 20 mEq by mouth daily. with food (Patient not taking: Reported on 09/02/2019) 02/23/2018: Patient instructed to hold this medication due to abnormal Potassium level of 6.2 on 02/18/18, per MD, Dr. Glendale Chard. Patient was given  verbal education related to complications from hypo and hyperkalemia and importance of following MD recommendations. Patient instructed to resume this medication when directed by PCP. Patient verbalizes understanding.   [name] levothyroxine (SYNTHROID) 125 MCG tablet Take 125 mcg by mouth daily before breakfast.   . loratadine (CLARITIN) 10 MG tablet Take 10 mg by mouth daily. (Patient not taking: Reported on 09/02/2019)   . magnesium oxide (MAG-OX) 400 MG tablet TAKE 1 TABLET BY MOUTH EVERY EVENING   . metoprolol tartrate (LOPRESSOR) 50 MG tablet Take 50 mg by mouth 2 (two) times daily.    [name] omeprazole (PRILOSEC) 20 MG capsule TAKE 1 CAPSULE BY MOUTH  DAILY   . pravastatin (PRAVACHOL) 40 MG tablet Take 1 tablet (40 mg total) by mouth daily.   . prednisoLONE acetate (PRED FORTE) 1 % ophthalmic suspension Place 1 drop into the right eye 3 (three) times daily. (Patient not taking: Reported on 09/02/2019)   . predniSONE (DELTASONE) 10 MG tablet Take by mouth. (Patient not taking: Reported on 09/02/2019)   . telmisartan (MICARDIS) 80 MG tablet TAKE 1 TABLET BY MOUTH  DAILY   . triamcinolone cream (KENALOG) 0.1 % APPLY TO AFFECTED AREA TWICE A DAY    No facility-administered encounter medications on file as of 09/21/2019.     Goals Addressed            This Visit's Progress   . "my mom needs rehab"   On track    Daughter Stated  CARE PLAN ENTRY (see longitudinal plan of care for additional care plan information)  Current Barriers:  . Ongoing cellulitis infection limiting patient ability to perform ADL's on her own . Inability to maneuver around the home due to decreased ambulation ability and insufficient DME in the home (see alternative goal's regarding obtaining DME) . Limited access to caregiver . Lacks knowledge of how to access care in a rehab facility . Chronic conditions including DM II and CKD III which put patient at increased risk for hospitalization  Social Work Clinical Goal(s):   [name] Over the next 10 days the patient and her daughter will work with embedded care management team to address concerns surrounding ADL limitations  CCM SW Interventions: Completed 09/21/19 . Inbound call received from Darl Householder with [employer] to discuss patient ramp needs o Discussed the patient may be eligible to receive a temporary ramp through [facility] to assist with needed appointments o Advised by Malachy Mood she would speak with her team to determine supplies on hand and get back to SW . Successful outbound call placed to the patients daughter to provide an update on plan for wheelchair ramp . Collaboration with RN Care Manager regarding plan and to obtain update on wheelchair needs of the patient  Completed 09/17/19 with Larena Glassman RN from [place] . Inbound call received from [place] with [facility] o Patient admitted to [facility] 09/16/19 o Larena Glassman reports the patient is unable to leave her home safely and would benefit from attending a Lymphedema Clinic o Discussed the patient is active with [employer] under "Aging Gracefully" program o [place] would [employer] to gather information on home modification timeline . Unsuccessful outbound call placed to Darylene Price with [employer] o Voice message left requesting a return call . Secure e-mail collaboration with Gene Owens Shark with [employer] to request feedback surrounding ramp installation timeframe . SW will follow up with Larena Glassman over the next week  Completed 09/15/19 with the patients daughter [name] Folden . Inter-disciplinary care team collaboration (see longitudinal plan of care) . Inbound call received from the patients daughter who reports the patient needs inpatient rehab and she would like SW assistance with placement . Determined the patient continues to have difficulty with ambulation in the home o The patient has dealt with  a cellulitis infection which is causing edema to the lower extremities o The patient does not own a wheel chair but has been working with Consulting civil engineer to obtain DME in the home . Provided education that due to Medicare guidelines, the patients health plan will not cover admission to a SNF for rehab without a 3 mid-night hospital admission o Patient seen in ED 7/25 for leg swelling, patients daughter reports this swelling has not worsened . Ree Edman, SW will collaborate with RN Care Manager and Dr. Baird Cancer to determine available options to the patient . Collaboration with Barb Merino, [place] who discussed recent referral to [facility] for in home care needs . Outbound call placed to [facility], spoke with Ebony Hail who confirms receipt of referral sent on 8/26. No visit is scheduled at this time o Ebony Hail advised SW she will reach out to her leadership team for more information and contact SW later in the day . Collaboration with Dr. Baird Cancer to inform of the patients daughters request and current barriers o Dr. Baird Cancer agrees with awaiting for return call from [place] . Received voice message from  Reche Dixon with [facility] stating she attempted to schedule an intake appointment with the patient but was unsuccessful.  o SW placed an unsuccessful outbound call to Mrs. Beryl Meager, SW left voice message providing contact number to the patients daughter as well . Successful outbound call placed to the patients daughter to provide updates on above interventions o Determined the patient has been in contact with [facility] and has an appointment scheduled for 9/2 at 1:00 pm . SW will continue to folow  Patient Self Care Activities:  . Attends all scheduled provider appointments . Calls provider office for new concerns or questions . Unable to perform ADLs independently . Unable to perform IADLs independently  Please see past updates related to this goal by clicking on  the "Past Updates" button in the selected goal          Follow Up Plan: SW will follow up with patient by phone over the next week.   [name], BSW, CDP Social Worker, Certified Dementia Practitioner [place] / [facility] [phone]  Total time spent performing care coordination and/or care management activities with the patient by phone or face to face = 15 minutes.

## 2019-09-21 NOTE — Patient Instructions (Signed)
Social Worker Visit Information  Goals we discussed today:  Goals Addressed            This Visit's Progress    "my mom needs rehab"   On track    Daughter Stated  CARE PLAN ENTRY (see longitudinal plan of care for additional care plan information)  Current Barriers:   Ongoing cellulitis infection limiting patient ability to perform ADL's on her own  Inability to maneuver around the home due to decreased ambulation ability and insufficient DME in the home (see alternative goal's regarding obtaining DME)  Limited access to caregiver  Lacks knowledge of how to access care in a rehab facility  Chronic conditions including DM II and CKD III which put patient at increased risk for hospitalization  Social Work Clinical Goal(s):   Over the next 10 days the patient and her daughter will work with embedded care management team to address concerns surrounding ADL limitations  CCM SW Interventions: Completed 09/21/19  Inbound call received from Vito Berger with [employer] to discuss patient ramp needs o Discussed the patient may be eligible to receive a temporary ramp through [facility] to assist with needed appointments o Advised by Elnita Maxwell she would speak with her team to determine supplies on hand and get back to SW  Successful outbound call placed to the patients daughter to provide an update on plan for wheelchair ramp  Collaboration with RN Care Manager regarding plan and to obtain update on wheelchair needs of the patient  Completed 09/17/19 with Bethann Berkshire RN from [facility]  Inbound call received from [place] RN with [facility] o Patient admitted to [facility] 09/16/19 o Bethann Berkshire reports the patient is unable to leave her home safely and would benefit from attending a Lymphedema Clinic o Discussed the patient is active with [employer] under "Aging Gracefully" program o Shela Nevin SW would outreach [employer] to gather  information on home modification timeline  Unsuccessful outbound call placed to [employer] with [employer] o Voice message left requesting a return call  Secure e-mail collaboration with [employer] with [employer] to request feedback surrounding ramp installation timeframe  SW will follow up with Bethann Berkshire over the next week  Completed 09/15/19 with the patients daughter Cinde Ebert  Inter-disciplinary care team collaboration (see longitudinal plan of care)  Inbound call received from the patients daughter who reports the patient needs inpatient rehab and she would like SW assistance with placement  Determined the patient continues to have difficulty with ambulation in the home o The patient has dealt with a cellulitis infection which is causing edema to the lower extremities o The patient does not own a wheel chair but has been working with Medical illustrator to obtain DME in the home  Provided education that due to Medicare guidelines, the patients health plan will not cover admission to a SNF for rehab without a 3 mid-night hospital admission o Patient seen in ED 7/25 for leg swelling, patients daughter reports this swelling has not worsened  Campbell Stall, SW will collaborate with RN Care Manager and Dr. Allyne Gee to determine available options to the patient  Collaboration with Delsa Sale, RN Care Manager who discussed recent referral to [facility] for in home care needs  Outbound call placed to [facility], spoke with Revonda Standard who confirms receipt of referral sent on 8/26. No visit is scheduled at this time o Revonda Standard advised SW she will reach out to her leadership team  for more information and contact SW later in the day  Collaboration with Dr. Allyne Gee to inform of the patients daughters request and current barriers o Dr. Allyne Gee agrees with awaiting for return call from [facility]  Received voice message from Cindra Eves with [facility]  stating she attempted to schedule an intake appointment with the patient but was unsuccessful.  o SW placed an unsuccessful outbound call to Mrs. Darcella Gasman, SW left voice message providing contact number to the patients daughter as well  Successful outbound call placed to the patients daughter to provide updates on above interventions o Determined the patient has been in contact with [facility] and has an appointment scheduled for 9/2 at 1:00 pm  SW will continue to folow  Patient Self Care Activities:   Attends all scheduled provider appointments  Calls provider office for new concerns or questions  Unable to perform ADLs independently  Unable to perform IADLs independently  Please see past updates related to this goal by clicking on the "Past Updates" button in the selected goal          Follow Up Plan: SW will follow up with patient by phone over the next week   Bevelyn Ngo, BSW, CDP Social Worker, Certified Dementia Practitioner [facility] Care Management [phone]

## 2019-09-22 ENCOUNTER — Other Ambulatory Visit: Payer: Self-pay

## 2019-09-22 ENCOUNTER — Telehealth: Payer: Medicare Other

## 2019-09-22 ENCOUNTER — Ambulatory Visit: Payer: Self-pay

## 2019-09-22 ENCOUNTER — Ambulatory Visit: Payer: Medicare Other

## 2019-09-22 DIAGNOSIS — N183 Chronic kidney disease, stage 3 unspecified: Secondary | ICD-10-CM

## 2019-09-22 DIAGNOSIS — R600 Localized edema: Secondary | ICD-10-CM

## 2019-09-22 DIAGNOSIS — E1122 Type 2 diabetes mellitus with diabetic chronic kidney disease: Secondary | ICD-10-CM

## 2019-09-22 DIAGNOSIS — I129 Hypertensive chronic kidney disease with stage 1 through stage 4 chronic kidney disease, or unspecified chronic kidney disease: Secondary | ICD-10-CM

## 2019-09-22 NOTE — Chronic Care Management (AMB) (Signed)
Chronic Care Management    Social Work Follow Up Note  09/22/2019 Name: Mary [name] MRN: [id number] DOB: Dec 22, 1945  Mary [name] is a 74 y.o. year old female who is a primary care patient of Glendale Chard, MD. The CCM team was consulted for assistance with care coordination.   Review of patient status, including review of consultants reports, other relevant assessments, and collaboration with appropriate care team members and the patient's provider was performed as part of comprehensive patient evaluation and provision of chronic care management services.    SDOH (Social Determinants of Health) assessments performed: No    Outpatient Encounter Medications as of 09/22/2019  Medication Sig Note  . ACCU-CHEK AVIVA PLUS test strip Use as directed to check blood sugars 1 time per day dx: e11.22   . aspirin EC 81 MG tablet Take 81 mg by mouth daily.   [name] 28G MISC    . Blood Glucose Monitoring Suppl (ACCU-CHEK AVIVA PLUS) w/Device KIT Use as directed to check blood sugars 2 times per day dx: e11.65   . cetirizine (ZYRTEC) 10 MG tablet TAKE 1 TABLET BY MOUTH EVERY DAY   . dorzolamide-timolol (COSOPT) 22.3-6.8 MG/ML ophthalmic solution Place 1 drop into the right eye 2 (two) times daily.   . fluorometholone (FML) 0.1 % ophthalmic suspension Place 1 drop into the right eye 3 (three) times daily. (Patient not taking: Reported on 09/02/2019)   . fluticasone (FLONASE) 50 MCG/ACT nasal spray SPRAY 1 SPRAY INTO EACH NOSTRIL EVERY DAY   . furosemide (LASIX) 40 MG tablet Take 1 tablet (40 mg total) by mouth daily.   [name] HYDROcodone-acetaminophen (NORCO) 10-325 MG tablet Take 1 tablet by mouth 5 (five) times daily as needed.   [name] KLOR-CON M20 20 MEQ tablet Take 20 mEq by mouth daily. with food (Patient not taking: Reported on 09/02/2019) 02/23/2018: Patient instructed to hold this medication due to abnormal Potassium level of 6.2 on 02/18/18, per MD, Dr. Glendale Chard. Patient was given  verbal education related to complications from hypo and hyperkalemia and importance of following MD recommendations. Patient instructed to resume this medication when directed by PCP. Patient verbalizes understanding.   [name] levothyroxine (SYNTHROID) 125 MCG tablet Take 125 mcg by mouth daily before breakfast.   . loratadine (CLARITIN) 10 MG tablet Take 10 mg by mouth daily. (Patient not taking: Reported on 09/02/2019)   . magnesium oxide (MAG-OX) 400 MG tablet TAKE 1 TABLET BY MOUTH EVERY EVENING   . metoprolol tartrate (LOPRESSOR) 50 MG tablet Take 50 mg by mouth 2 (two) times daily.    [name] omeprazole (PRILOSEC) 20 MG capsule TAKE 1 CAPSULE BY MOUTH  DAILY   . pravastatin (PRAVACHOL) 40 MG tablet Take 1 tablet (40 mg total) by mouth daily.   . prednisoLONE acetate (PRED FORTE) 1 % ophthalmic suspension Place 1 drop into the right eye 3 (three) times daily. (Patient not taking: Reported on 09/02/2019)   . predniSONE (DELTASONE) 10 MG tablet Take by mouth. (Patient not taking: Reported on 09/02/2019)   . telmisartan (MICARDIS) 80 MG tablet TAKE 1 TABLET BY MOUTH  DAILY   . triamcinolone cream (KENALOG) 0.1 % APPLY TO AFFECTED AREA TWICE A DAY    No facility-administered encounter medications on file as of 09/22/2019.     Goals Addressed            This Visit's Progress   . "my mom needs rehab"       Daughter Stated  CARE  PLAN ENTRY (see longitudinal plan of care for additional care plan information)  Current Barriers:  . Ongoing cellulitis infection limiting patient ability to perform ADL's on her own . Inability to maneuver around the home due to decreased ambulation ability and insufficient DME in the home (see alternative goal's regarding obtaining DME) . Limited access to caregiver . Lacks knowledge of how to access care in a rehab facility . Chronic conditions including DM II and CKD III which put patient at increased risk for hospitalization  Social Work Clinical Goal(s):  [name] Over the  next 10 days the patient and her daughter will work with embedded care management team to address concerns surrounding ADL limitations  CCM SW Interventions: Completed 09/22/19 . Inbound call received from Darl Householder with [employer] who reports a temporary ramp will be installed "today" 09/22/19 . Outbound call placed to [place] with [facility]; voice message left to inform of ramp timeline . Outbound call placed to the patients daughter to inform of plan for ramp installation o [name] reports she just spoke with the patient who stated a team was there already but had to leave due to bad weather o Discussed the team may return later this afternoon or tomorrow o Determined the patient is still in need of a wheelchair in order to leave the home o Discussed the patient has previously informed RN Care Manager of desire to purchase a wheel chair out of pocket in order to use Medicare benefit for an electric wheelchair o Ree Edman SW will outreach patient care team for information . Collaboration with Barb Merino embedded RN Care Manager and [place] with [facility] to inform of ramp installation and request feedback on patient DME needs . Successful outbound call placed to the patient to review DME options o Patient reports she planned to rent a manual wheelchair through [facility] but unfortunately they do not have the correct size available o Discussed the patient does not want a manual wheelchair ordered through Medicare benefit due to desire to obtain an electric wheelchair o Advised the patient the embedded care team would follow up with [facility] regarding DME needs . Collaboration with [facility] to discuss interventions and plan to obtain a wheelchair on behalf of the patient  Completed 09/21/19 . Inbound call received from Darl Householder with [employer] to discuss patient  ramp needs o Discussed the patient may be eligible to receive a temporary ramp through [facility] to assist with needed appointments o Advised by Malachy Mood she would speak with her team to determine supplies on hand and get back to SW . Successful outbound call placed to the patients daughter to provide an update on plan for wheelchair ramp . Collaboration with RN Care Manager regarding plan and to obtain update on wheelchair needs of the patient  Completed 09/17/19 with Larena Glassman RN from [place] . Inbound call received from [place] with [facility] o Patient admitted to [facility] 09/16/19 o Larena Glassman reports the patient is unable to leave her home safely and would benefit from attending a Lymphedema Clinic o Discussed the patient is active with [employer] under "Aging Gracefully" program o [place] would [employer] to gather information on home modification timeline . Unsuccessful outbound call placed to Darylene Price with [employer] o Voice message left requesting a return call . Secure e-mail collaboration with [employer] with [employer] to request feedback surrounding ramp installation timeframe . SW will follow up with Larena Glassman over the next week  Completed 09/15/19 with the patients daughter Karmel Patricelli . Inter-disciplinary care team collaboration (see longitudinal plan of care) . Inbound call received from the patients daughter who reports the patient needs inpatient rehab and she would like SW assistance with placement . Determined the patient continues to have difficulty with ambulation in the home o The patient has dealt with a cellulitis infection which is causing edema to the lower extremities o The patient does not own a wheel chair but has been working with Consulting civil engineer to obtain DME in the home . Provided education that due to Medicare guidelines, the patients health plan will not cover admission to  a SNF for rehab without a 3 mid-night hospital admission o Patient seen in ED 7/25 for leg swelling, patients daughter reports this swelling has not worsened . Ree Edman, SW will collaborate with RN Care Manager and Dr. Baird Cancer to determine available options to the patient . Collaboration with Barb Merino, [place] who discussed recent referral to [facility] for in home care needs . Outbound call placed to [facility], spoke with Ebony Hail who confirms receipt of referral sent on 8/26. No visit is scheduled at this time o Ebony Hail advised SW she will reach out to her leadership team for more information and contact SW later in the day . Collaboration with Dr. Baird Cancer to inform of the patients daughters request and current barriers o Dr. Baird Cancer agrees with awaiting for return call from [place] . Received voice message from [place]he [place] with [facility] stating she attempted to schedule an intake appointment with the patient but was unsuccessful.  o SW placed an unsuccessful outbound call to Mrs. Beryl Meager, SW left voice message providing contact number to the patients daughter as well . Successful outbound call placed to the patients daughter to provide updates on above interventions o Determined the patient has been in contact with [facility] and has an appointment scheduled for 9/2 at 1:00 pm . SW will continue to folow  Patient Self Care Activities:  . Attends all scheduled provider appointments . Calls provider office for new concerns or questions . Unable to perform ADLs independently . Unable to perform IADLs independently  Please see past updates related to this goal by clicking on the "Past Updates" button in the selected goal          Follow Up Plan: Embedded care coordination team will continue to follow patient.  [name], BSW, CDP Social Worker, Certified Dementia Practitioner [place] [facility] [phone]  Total time spent performing  care coordination and/or care management activities with the patient by phone or face to face = 35 minutes.

## 2019-09-22 NOTE — Patient Instructions (Signed)
Social Worker Visit Information  Goals we discussed today:  Goals Addressed            This Visit's Progress    "my mom needs rehab"       Daughter Stated  CARE PLAN ENTRY (see longitudinal plan of care for additional care plan information)  Current Barriers:   Ongoing cellulitis infection limiting patient ability to perform ADL's on her own  Inability to maneuver around the home due to decreased ambulation ability and insufficient DME in the home (see alternative goal's regarding obtaining DME)  Limited access to caregiver  Lacks knowledge of how to access care in a rehab facility  Chronic conditions including DM II and CKD III which put patient at increased risk for hospitalization  Social Work Clinical Goal(s):   Over the next 10 days the patient and her daughter will work with embedded care management team to address concerns surrounding ADL limitations  CCM SW Interventions: Completed 09/22/19  Inbound call received from Vito Berger with [employer] who reports a temporary ramp will be installed "today" 09/22/19  Outbound call placed to [place] RN with [facility]; voice message left to inform of ramp timeline  Outbound call placed to the patients daughter to inform of plan for ramp installation o [name] reports she just spoke with the patient who stated a team was there already but had to leave due to bad weather o Discussed the team may return later this afternoon or tomorrow o Determined the patient is still in need of a wheelchair in order to leave the home o Discussed the patient has previously informed RN Care Manager of desire to purchase a wheel chair out of pocket in order to use Medicare benefit for an electric wheelchair o Campbell Stall SW will outreach patient care team for information  Collaboration with Delsa Sale embedded RN Care Manager and [name] RN Care Manager with [facility] to inform of ramp  installation and request feedback on patient DME needs  Successful outbound call placed to the patient to review DME options o Patient reports she planned to rent a manual wheelchair through [facility] but unfortunately they do not have the correct size available o Discussed the patient does not want a manual wheelchair ordered through Medicare benefit due to desire to obtain an electric wheelchair o Advised the patient the embedded care team would follow up with [facility] regarding DME needs  Collaboration with Delsa Sale RN Care Manager to discuss interventions and plan to obtain a wheelchair on behalf of the patient  Completed 09/21/19  Inbound call received from Vito Berger with [employer] to discuss patient ramp needs o Discussed the patient may be eligible to receive a temporary ramp through [facility] to assist with needed appointments o Advised by Elnita Maxwell she would speak with her team to determine supplies on hand and get back to SW  Successful outbound call placed to the patients daughter to provide an update on plan for wheelchair ramp  Collaboration with RN Care Manager regarding plan and to obtain update on wheelchair needs of the patient  Completed 09/17/19 with Bethann Berkshire RN from [facility]  Inbound call received from [place] RN with [facility] o Patient admitted to [facility] 09/16/19 o Bethann Berkshire reports the patient is unable to leave her home safely and would benefit from attending a Lymphedema Clinic o Discussed the patient is active with [employer] under "Aging Gracefully" program o [place] SW  would outreach [employer] to gather information on home modification timeline  Unsuccessful outbound call placed to [employer] with [employer] o Voice message left requesting a return call  Secure e-mail collaboration with [employer] with [employer] to request feedback  surrounding ramp installation timeframe  SW will follow up with Bethann Berkshire over the next week  Completed 09/15/19 with the patients daughter Azalynn Maxim  Inter-disciplinary care team collaboration (see longitudinal plan of care)  Inbound call received from the patients daughter who reports the patient needs inpatient rehab and she would like SW assistance with placement  Determined the patient continues to have difficulty with ambulation in the home o The patient has dealt with a cellulitis infection which is causing edema to the lower extremities o The patient does not own a wheel chair but has been working with Medical illustrator to obtain DME in the home  Provided education that due to Medicare guidelines, the patients health plan will not cover admission to a SNF for rehab without a 3 mid-night hospital admission o Patient seen in ED 7/25 for leg swelling, patients daughter reports this swelling has not worsened  Campbell Stall, SW will collaborate with RN Care Manager and Dr. Allyne Gee to determine available options to the patient  Collaboration with Delsa Sale, RN Care Manager who discussed recent referral to [facility] for in home care needs  Outbound call placed to [facility], spoke with Revonda Standard who confirms receipt of referral sent on 8/26. No visit is scheduled at this time o Revonda Standard advised SW she will reach out to her leadership team for more information and contact SW later in the day  Collaboration with Dr. Allyne Gee to inform of the patients daughters request and current barriers o Dr. Allyne Gee agrees with awaiting for return call from [facility]  Received voice message from Cindra Eves with [facility] stating she attempted to schedule an intake appointment with the patient but was unsuccessful.  o SW placed an unsuccessful outbound call to Mrs. Darcella Gasman, SW left voice message providing contact number to the patients daughter as well  Successful outbound call placed to  the patients daughter to provide updates on above interventions o Determined the patient has been in contact with [facility] and has an appointment scheduled for 9/2 at 1:00 pm  SW will continue to folow  Patient Self Care Activities:   Attends all scheduled provider appointments  Calls provider office for new concerns or questions  Unable to perform ADLs independently  Unable to perform IADLs independently  Please see past updates related to this goal by clicking on the "Past Updates" button in the selected goal          Follow Up Plan: Care Management team will continue to follow   Bevelyn Ngo, BSW, CDP Social Worker, Certified Dementia Practitioner [facility] Care Management [phone]

## 2019-09-23 ENCOUNTER — Telehealth: Payer: Medicare Other

## 2019-09-23 NOTE — Progress Notes (Signed)
[facility] Note  09/28/2019     CHIEF COMPLAINT Patient presents for Retina Follow Up   HISTORY OF PRESENT ILLNESS: Mary [name] is a 74 y.o. female who presents to the clinic today for:   HPI    Retina Follow Up    Patient presents with  CRVO/BRVO.  In both eyes.  This started weeks ago.  Severity is moderate.  Duration of weeks.  Since onset it is stable.  I, the attending physician,  performed the HPI with the patient and updated documentation appropriately.          Comments    Pt states vision is the same OU.  Pt denies eye pain or discomfort and denies any new or worsening floaters or fol OU.       Last edited by Bernarda Caffey, MD on 09/28/2019 10:53 AM. (History)    pt states she is delayed to follow up due to needing a wheelchair and ramp to get out of her house, she states the swelling in her legs is getting worse, she states she is going to a lymphedema clinic to get treatment for the swelling   Referring physician: Glendale Chard, MD [address] [place],   [address]  HISTORICAL INFORMATION:   Selected notes from the [place] Transferred care from Dr. Zigmund Daniel   CURRENT MEDICATIONS: Current Outpatient Medications (Ophthalmic Drugs)  Medication Sig  . dorzolamide-timolol (COSOPT) 22.3-6.8 MG/ML ophthalmic solution Place 1 drop into the right eye 2 (two) times daily.  . fluorometholone (FML) 0.1 % ophthalmic suspension Place 1 drop into the right eye 3 (three) times daily. (Patient not taking: Reported on 09/02/2019)  . prednisoLONE acetate (PRED FORTE) 1 % ophthalmic suspension Place 1 drop into the right eye 3 (three) times daily. (Patient not taking: Reported on 09/02/2019)   No current facility-administered medications for this visit. (Ophthalmic Drugs)   Current Outpatient Medications (Other)  Medication Sig  . ACCU-CHEK AVIVA PLUS test strip Use as directed to check blood sugars 1 time per day  dx: e11.22  . aspirin EC 81 MG tablet Take 81 mg by mouth daily.  [name] 28G MISC   . Blood Glucose Monitoring Suppl (ACCU-CHEK AVIVA PLUS) w/Device KIT Use as directed to check blood sugars 2 times per day dx: e11.65  . cetirizine (ZYRTEC) 10 MG tablet TAKE 1 TABLET BY MOUTH EVERY DAY  . fluticasone (FLONASE) 50 MCG/ACT nasal spray SPRAY 1 SPRAY INTO EACH NOSTRIL EVERY DAY  . furosemide (LASIX) 40 MG tablet Take 1 tablet (40 mg total) by mouth daily.  [name] HYDROcodone-acetaminophen (NORCO) 10-325 MG tablet Take 1 tablet by mouth 5 (five) times daily as needed.  [name] KLOR-CON M20 20 MEQ tablet Take 20 mEq by mouth daily. with food (Patient not taking: Reported on 09/02/2019)  . levothyroxine (SYNTHROID) 125 MCG tablet Take 125 mcg by mouth daily before breakfast.  . loratadine (CLARITIN) 10 MG tablet Take 10 mg by mouth daily. (Patient not taking: Reported on 09/02/2019)  . magnesium oxide (MAG-OX) 400 MG tablet TAKE 1 TABLET BY MOUTH EVERY EVENING  . metoprolol tartrate (LOPRESSOR) 50 MG tablet Take 50 mg by mouth 2 (two) times daily.   [name] omeprazole (PRILOSEC) 20 MG capsule TAKE 1 CAPSULE BY MOUTH  DAILY  . pravastatin (PRAVACHOL) 40 MG tablet TAKE 1 TABLET BY MOUTH EVERY DAY  . predniSONE (DELTASONE) 10 MG tablet Take by mouth. (Patient not taking: Reported on  09/02/2019)  . telmisartan (MICARDIS) 80 MG tablet TAKE 1 TABLET BY MOUTH  DAILY  . triamcinolone cream (KENALOG) 0.1 % APPLY TO AFFECTED AREA TWICE A DAY   No current facility-administered medications for this visit. (Other)      REVIEW OF SYSTEMS: ROS    Positive for: Genitourinary, Musculoskeletal, Endocrine, Eyes   Negative for: Constitutional, Gastrointestinal, Neurological, Skin, HENT, Cardiovascular, Respiratory, Psychiatric, Allergic/Imm, Heme/Lymph   Last edited by Doneen Poisson on 09/28/2019  9:36 AM. (History)       ALLERGIES No Known Allergies  PAST MEDICAL HISTORY Past Medical History:  Diagnosis  Date  . Arthritis    osteoarthritis  . Cataract    OS  . Diabetes mellitus    Insulin dependent  . Diabetic retinopathy ([place])    NPDR OU  . Glaucoma    POAG OD  . H/O cardiovascular stress test    pt. reports 10/13- was normal per pt.   . Hyperlipemia   . Hypertension   . Hypertensive retinopathy    OU   Past Surgical History:  Procedure Laterality Date  . CARPAL TUNNEL RELEASE Bilateral   . CATARACT EXTRACTION Right 10/05/2018   Dr. Kathlen Mody  . EYE SURGERY Right    Cat Sx  . KNEE ARTHROSCOPY     right  . QUADRICEPS TENDON REPAIR  02/17/2012   Procedure: REPAIR QUADRICEP TENDON;  Surgeon: Vickey Huger, MD;  Location: [facility];  Service: Orthopedics;  Laterality: Right;  right quadriceps repair with latera release  . QUADRICEPS TENDON REPAIR Right 05/15/2012   Procedure: REPAIR QUADRICEP TENDON;  Surgeon: Vickey Huger, MD;  Location: [facility];  Service: Orthopedics;  Laterality: Right;  . TOTAL KNEE ARTHROPLASTY  10/21/2011   rt tk  . TOTAL KNEE ARTHROPLASTY  10/21/2011   Procedure: TOTAL KNEE ARTHROPLASTY;  Surgeon: Rudean Haskell, MD;  Location: [facility];  Service: Orthopedics;  Laterality: Right;  . TUBAL LIGATION      FAMILY HISTORY Family History  Problem Relation Age of Onset  . Hypertension Mother   . Heart Problems Father   . Gout Father   . Arthritis Father     SOCIAL HISTORY Social History   Tobacco Use  . Smoking status: Former Smoker    Packs/day: 0.25    Years: 20.00    Pack years: 5.00    Types: Cigarettes    Quit date: 06/28/2014    Years since quitting: 5.2  . Smokeless tobacco: Never Used  Vaping Use  . Vaping Use: Never used  Substance Use Topics  . Alcohol use: No  . Drug use: Yes    Types: Hydrocodone         OPHTHALMIC EXAM:  Base Eye Exam    Visual Acuity (Snellen - Linear)      Right Left   Dist cc 20/80 -2 HM   Dist ph cc NI    Correction: Glasses       Tonometry (Tonopen, 9:40 AM)      Right Left   Pressure 08 07       Pupils       Pupils Dark Light Shape React APD   Right PERRL 3 2 Round Minimal 0   Left PERRL 3 2 Round Minimal 0       Visual Fields      Left Right     Full   Restrictions Total superior temporal, inferior temporal, superior nasal, inferior nasal deficiencies        Extraocular  Movement      Right Left    Full Full       Neuro/Psych    Oriented x3: Yes   Mood/Affect: Normal       Dilation    Both eyes: 1.0% Mydriacyl, 2.5% Phenylephrine @ 9:42 AM        Slit Lamp and Fundus Exam    Slit Lamp Exam      Right Left   Lids/Lashes Dermatochalasis - upper lid, mild Meibomian gland dysfunction Dermatochalasis - upper lid, mild Meibomian gland dysfunction   Conjunctiva/Sclera Melanosis Melanosis   Cornea 2-3+ Punctate epithelial erosions, well healed temporal cataract wounds 3+ Punctate epithelial erosions, well healed temporal cataract wounds   Anterior Chamber Deep and quiet Deep and quiet   Iris Round and moderately dilated to 5.36m Round and moderately dilated to 610m  Lens PC IOL in good position, trace, cortical remnant temporally, PC folds 2-3+ Nuclear sclerosis, 2-3+ Cortical cataract   Vitreous Vitreous syneresis, vitreous condensations, Ozurdex pellet remnants inferiorly, no residual IVT Vitreous syneresis       Fundus Exam      Right Left   Disc  2-3+Pallor, Sharp rim, temporal PPA Mild Pallor, Sharp rim, PPP   C/D Ratio 0.5 0.3   Macula Blunted foveal reflex, Drusen, Retinal pigment epithelial mottling, temporal edema with focal exudates-- persistent Flat, Blunted foveal reflex, RPE mottling and clumping, scarring, atrophy, no heme   Vessels Vascular attenuation, Tortuous Severe Vascular attenuation, +fibrosis   Periphery Attached    Attached, pavingstone and reticular degeneration inferiorly, scattered RPE atrophy          IMAGING AND PROCEDURES  Imaging and Procedures for _0 @  OCT, Retina - OU - Both Eyes       Right Eye Quality was good. Central Foveal  Thickness: 291. Progression has been stable. Findings include abnormal foveal contour, intraretinal fluid, intraretinal hyper-reflective material, no SRF, vitreomacular adhesion  (persistent IRF ST macula, slight improvement centrally).   Left Eye Quality was good. Central Foveal Thickness: 227. Progression has been stable. Findings include abnormal foveal contour, no IRF, no SRF, outer retinal atrophy, epiretinal membrane, preretinal fibrosis, subretinal hyper-reflective material, pigment epithelial detachment (Stable from prior).   Notes *Images captured and stored on drive  Diagnosis / Impression:  OD: CRVO w/ persistent IRF ST macula, slight improvement centrally OS: diffuse ORA - Stable from prior  Clinical management:  See below  Abbreviations: NFP - Normal foveal profile. CME - cystoid macular edema. PED - pigment epithelial detachment. IRF - intraretinal fluid. SRF - subretinal fluid. EZ - ellipsoid zone. ERM - epiretinal membrane. ORA - outer retinal atrophy. ORT - outer retinal tubulation. SRHM - subretinal hyper-reflective material        Intravitreal Injection, Pharmacologic Agent - OD - Right Eye       Time Out 09/28/2019. 10:56 AM. Confirmed correct patient, procedure, site, and patient consented.   Anesthesia Topical anesthesia was used. Anesthetic medications included Lidocaine 2%, Proparacaine 0.5%.   Procedure Preparation included 5% betadine to ocular surface, eyelid speculum. A 27 gauge needle was used.   Injection:  4 mg Triescence 4081mlL injection   NDC: [id number]: 10cw0, Expiration date: 09/13/2020   Route: Intravitreal, Site: Right Eye, Waste: 0.9 mL  Post-op Post injection exam found visual acuity of at least counting fingers. The patient tolerated the procedure well. There were no complications. The patient received written and verbal post procedure care education.   Notes An AC tap was performed following  injection due to elevated IOP using  a 30 gauge needle on a syringe with the plunger removed. The needle was placed at the limbus at 5 oclock and approximately 0.05 cc of aqueous was removed from the anterior chamber. Betadine was applied to the tap area before and after the paracentesis was performed. There were no complications. The patient tolerated the procedure well. The IOP was rechecked and was found to be ~8 mmHg by tonopen.                 ASSESSMENT/PLAN:    ICD-10-CM   1. Central retinal vein occlusion with macular edema of both eyes  H34.8130 Intravitreal Injection, Pharmacologic Agent - OD - Right Eye    triamcinolone acetonide (TRIESENCE) 40 MG/ML subtenons injection 4 mg  2. Moderate nonproliferative diabetic retinopathy of both eyes with macular edema associated with type 2 diabetes mellitus ([place])  F63.8466   3. Retinal edema  H35.81 OCT, Retina - OU - Both Eyes  4. Essential hypertension  I10   5. Hypertensive retinopathy of both eyes  H35.033   6. Combined forms of age-related cataract of left eye  H25.812   7. Pseudophakia  Z96.1   8. Primary open angle glaucoma (POAG) of right eye, mild stage  H40.1111    1. CRVO w/ CME OU  - pt of Dr. Zigmund Daniel who wished to transfer care  - OS long-standing low vision (HM) with subfoveal scar and central retinal atrophy  - OD with CME actively being managed with Ozurdex OD ~q5 wks by Dr. Zigmund Daniel (last on 8.20.20) s/p multiple IVTA and Ozurdex OD  - was due for Ozurdex OD w/ Zigmund Daniel on 9.25.20, but underwent cataract surgery OD w/ Kathlen Mody on 9.21.20  - s/p IVTA OD #4 (10.26.20), #5 (12.10.20), #6 (01.21.21), #7 (03.05.21), #8 (04.16.21), #9 (05.28.21), #10 (7.9.21)  - today, f/u delayed from 6 wks to 9 wks BCVA OD 20/80 (stable)  - BCVA stable at 20/80 OD  - exam shows no residual IVTA in vitreous cavity  - OCT shows persistent IRF ST macula, slight improvement centrally  - recommend IVTA OD #11 today, 09.14.21  - pt wishes to proceed  - RBA of procedure  discussed, questions answered  - informed consent obtained  - see procedure note -- AC tap  - f/u in 6 wks -- DFE/OCT/possible injection  2,3. Severe Non-proliferative diabetic retinopathy OU.  - OCT shows diabetic macular edema, OD   - recommend IVTA OD today, 09.14.21 as above for CRVO  4,5. Hypertensive retinopathy OU  - discussed importance of tight BP control  - monitor  6. Mixed form age related cataract OS  - under the expert management of Dr. Kathlen Mody  7. Pseudophakia OD  - s/p CE/IOL OD (9.21.20) by expert surgeon, Dr. Kathlen Mody  - IOL in excellent position  - had post op IOP spike -- IOP now under better control  - monitor  8. POAG OD  - under the expert management of Dr. Kathlen Mody  - s/p SLT and goniotomy OD  - had post-cataract IOP spike  - IOP 08 today  Ophthalmic Meds Ordered this visit:  Meds ordered this encounter  Medications  . triamcinolone acetonide (TRIESENCE) 40 MG/ML subtenons injection 4 mg       Return in about 6 weeks (around 11/09/2019) for f/u CRVO OU, OCT, DFE.  There are no Patient Instructions on file for this visit.   Explained the diagnoses, plan, and follow up with the patient and they  expressed understanding.  Patient expressed understanding of the importance of proper follow up care.   This document serves as a record of services personally performed by Gardiner Sleeper, MD, PhD. It was created on their behalf by Estill Bakes, COT an ophthalmic technician. The creation of this record is the provider's dictation and/or activities during the visit.    Electronically signed by: Estill Bakes, COT 9.9.21 @ 11:02 PM   This document serves as a record of services personally performed by Gardiner Sleeper, MD, PhD. It was created on their behalf by San Jetty. Owens Shark, OA an ophthalmic technician. The creation of this record is the provider's dictation and/or activities during the visit.    Electronically signed by: San Jetty. Marguerita Merles 09.14.2021 11:02  PM  Gardiner Sleeper, M.D., Ph.D. Diseases & Surgery of the Retina and [place] 09/28/2019   I have reviewed the above documentation for accuracy and completeness, and I agree with the above. Gardiner Sleeper, M.D., Ph.D. 09/28/19 11:02 PM   Abbreviations: M myopia (nearsighted); A astigmatism; H hyperopia (farsighted); P presbyopia; Mrx spectacle prescription;  CTL contact lenses; OD right eye; OS left eye; OU both eyes  XT exotropia; ET esotropia; PEK punctate epithelial keratitis; PEE punctate epithelial erosions; DES dry eye syndrome; MGD meibomian gland dysfunction; ATs artificial tears; PFAT's preservative free artificial tears; [place] nuclear sclerotic cataract; PSC posterior subcapsular cataract; ERM epi-retinal membrane; PVD posterior vitreous detachment; RD retinal detachment; DM diabetes mellitus; DR diabetic retinopathy; NPDR non-proliferative diabetic retinopathy; PDR proliferative diabetic retinopathy; CSME clinically significant macular edema; DME diabetic macular edema; dbh dot blot hemorrhages; CWS cotton wool spot; POAG primary open angle glaucoma; C/D cup-to-disc ratio; HVF humphrey visual field; GVF goldmann visual field; OCT optical coherence tomography; IOP intraocular pressure; BRVO Branch retinal vein occlusion; CRVO central retinal vein occlusion; CRAO central retinal artery occlusion; BRAO branch retinal artery occlusion; RT retinal tear; SB scleral buckle; PPV pars plana vitrectomy; VH Vitreous hemorrhage; PRP panretinal laser photocoagulation; IVK intravitreal kenalog; VMT vitreomacular traction; MH Macular hole;  NVD neovascularization of the disc; NVE neovascularization elsewhere; AREDS age related eye disease study; ARMD age related macular degeneration; POAG primary open angle glaucoma; EBMD epithelial/anterior basement membrane dystrophy; ACIOL anterior chamber intraocular lens; IOL intraocular lens; PCIOL posterior chamber intraocular lens;  Phaco/IOL phacoemulsification with intraocular lens placement; [place] photorefractive keratectomy; LASIK laser assisted in situ keratomileusis; HTN hypertension; DM diabetes mellitus; COPD chronic obstructive pulmonary disease

## 2019-09-23 NOTE — Patient Instructions (Addendum)
Visit Information  Goals Addressed      Patient Stated   .  "I would like to new power Wheelchair to get around better outside of my home" (pt-stated)        CARE PLAN ENTRY (see longitudinal plan of care for additional care plan information)  Current Barriers:  [name] Knowledge Deficits related to determination for best DME to help improve mobility  . Chronic Disease Management support and education needs related to Diabetes Mellitus with stage 3 Chronic Kidney disease, Parenchymal renal hypertension  . Impaired Physical Mobility   Nurse Case Manager Clinical Goal(s):  [name] Over the next 90 days, patient will verbalize understanding of plan for evaluation and determination for best DME to help improve patient's mobility, manual W/C, verses electric W/C, verses power Scooter   CCM RN CM Interventions:  09/09/19 call completed with patient  . Inter-disciplinary care team collaboration (see longitudinal plan of care) . Evaluation of current treatment plan related to Impaired Physical Mobility and DME needs and patient's adherence to plan as established by provider. . Advised patient to discuss her DME needs with PCP at next OV in order for a face to face evaluation to be completed and PCP help make recommendations for best mechanical mobility option: Determined patient will benefit from having a power W/C verses a Scooter due to having home modifications with the assistance of [employer], discussed this will allow her to be mobile inside and outside of her home including a W/C ramp; Encouraged patient to reach out to [facility] to determine her out of pocket expense for power W/C or Scooter . Provided education to patient re: options to purchase or rent to purchase a manual W/C; Provided patient with the contact number for [facility] and advised this DME supplier will also assist with a W/C ramp if needed for temporary use until [employer] can install permanent ramp; Determined  patient will purchase a manual W/C out of pocket to allow Medicare reimbursement for power W/C; Determined patient will contact this DME supplier for this purchase ASAP  . Discussed plans with patient for ongoing care management follow up and provided patient with direct contact information for care management team 09/22/19 call completed with patient  . Determined patient is still in need of a manual W/C, discussed she will plan to privately rent a W/C and use her Medicare benefit for a power W/C; Confirmed [employer] completed her W/C ramp today . Discussed plan to contact [facility] to coordinate a face to face visit for the initiation of patient's request for a power W/C . Sent message via in basket communication to embedded BSW Bevelyn Ngo with an update 09/23/19 Outbound call placed to DME suppliers  . Placed outbound call to [place] [phone], spoke with Noreene Larsson regarding availability of a bariatric W/C for a private pay rental, determined the monthly rental cost is $51.75; determined Noreene Larsson is unable to verify this equipment is available for patient and a call back is needed to confirm availability . Placed outbound call to [employer], spoke with Ethelene Browns regarding private pay monthly rental for a bariatric W/C; Determined the patient will need a size 24 to safely accommodate her size; Discussed the monthly private rental is $85; Discussed Ethelene Browns will need to confirm this equipment is in stock and give this RN a call back to advise; Determined [employer] has the Bariatric W/C in stock to accommodate the patient's weight capacity . Placed outbound call to [employer]  RN Bethann Berkshire, discussed this RN is assisting patient with renting a manual W/C; advised her W/C ramp has been installed . Determined Bethann Berkshire RN has completed a face to face visit to assess patient for a power W/C; Determined Bethann Berkshire RN will assist this RN with coordination of DME, patient advised  accordingly . Determined from patient she will rent a manual W/C from [employer] and will have her neighbor pick up the W/C tomorrow am: Determined patient will use [facility] to purchase her power W/C  . Placed outbound call to [facility] with [employer] to advise and discuss next steps for setting up patients account . Placed outbound call to [place] requesting a return call to advise of next steps for ordering patient a power W/C . Received call from [place] with [facility], determined all documentation for patient's power W/C needs to be faxed to [facility] fax # [phone], notified Bethann Berkshire RN with [employer]  . Placed outbound call to Bethann Berkshire RN with [employer], left a detailed voice message providing her with the contact name/fax# to send all clinical documentation for patient's power W/C  Patient Self Care Activities:  . Self administers medications as prescribed . Attends all scheduled provider appointments . Calls pharmacy for medication refills . Performs ADL's independently . Performs IADL's independently . Calls provider office for new concerns or questions  Please see past updates related to this goal by clicking on the "Past Updates" button in the selected goal        Patient verbalizes understanding of instructions provided today.   Telephone follow up appointment with care management team member scheduled for: 09/24/19  Delsa Sale, RN, BSN, CCM Care Management Coordinator [facility]  Direct Phone: [phone]

## 2019-09-23 NOTE — Chronic Care Management (AMB) (Signed)
Chronic Care Management   Follow Up Note   09/23/2019 Name: Mary [name] MRN: [id number] DOB: [birth year]  Referred by: Glendale Chard, MD Reason for referral : Chronic Care Management (FU RN CM Call )   Mary [name] is a 74 y.o. year old female who is a primary care patient of Glendale Chard, MD. The CCM team was consulted for assistance with chronic disease management and care coordination needs.    Review of patient status, including review of consultants reports, relevant laboratory and other test results, and collaboration with appropriate care team members and the patient's provider was performed as part of comprehensive patient evaluation and provision of chronic care management services.    SDOH (Social Determinants of Health) assessments performed: No See Care Plan activities for detailed interventions related to [place])    Placed outbound follow up call to patient. Placed outbound call to DME supplier to assist with coordination of DME needs.   Outpatient Encounter Medications as of 09/22/2019  Medication Sig Note  . ACCU-CHEK AVIVA PLUS test strip Use as directed to check blood sugars 1 time per day dx: e11.22   . aspirin EC 81 MG tablet Take 81 mg by mouth daily.   [name] 28G MISC    . Blood Glucose Monitoring Suppl (ACCU-CHEK AVIVA PLUS) w/Device KIT Use as directed to check blood sugars 2 times per day dx: e11.65   . cetirizine (ZYRTEC) 10 MG tablet TAKE 1 TABLET BY MOUTH EVERY DAY   . dorzolamide-timolol (COSOPT) 22.3-6.8 MG/ML ophthalmic solution Place 1 drop into the right eye 2 (two) times daily.   . fluorometholone (FML) 0.1 % ophthalmic suspension Place 1 drop into the right eye 3 (three) times daily. (Patient not taking: Reported on 09/02/2019)   . fluticasone (FLONASE) 50 MCG/ACT nasal spray SPRAY 1 SPRAY INTO EACH NOSTRIL EVERY DAY   . furosemide (LASIX) 40 MG tablet Take 1 tablet (40 mg total) by mouth daily.   [name] HYDROcodone-acetaminophen  (NORCO) 10-325 MG tablet Take 1 tablet by mouth 5 (five) times daily as needed.   [name] KLOR-CON M20 20 MEQ tablet Take 20 mEq by mouth daily. with food (Patient not taking: Reported on 09/02/2019) 02/23/2018: Patient instructed to hold this medication due to abnormal Potassium level of 6.2 on 02/18/18, per MD, Dr. Glendale Chard. Patient was given verbal education related to complications from hypo and hyperkalemia and importance of following MD recommendations. Patient instructed to resume this medication when directed by PCP. Patient verbalizes understanding.   [name] levothyroxine (SYNTHROID) 125 MCG tablet Take 125 mcg by mouth daily before breakfast.   . loratadine (CLARITIN) 10 MG tablet Take 10 mg by mouth daily. (Patient not taking: Reported on 09/02/2019)   . magnesium oxide (MAG-OX) 400 MG tablet TAKE 1 TABLET BY MOUTH EVERY EVENING   . metoprolol tartrate (LOPRESSOR) 50 MG tablet Take 50 mg by mouth 2 (two) times daily.    [name] omeprazole (PRILOSEC) 20 MG capsule TAKE 1 CAPSULE BY MOUTH  DAILY   . pravastatin (PRAVACHOL) 40 MG tablet Take 1 tablet (40 mg total) by mouth daily.   . prednisoLONE acetate (PRED FORTE) 1 % ophthalmic suspension Place 1 drop into the right eye 3 (three) times daily. (Patient not taking: Reported on 09/02/2019)   . predniSONE (DELTASONE) 10 MG tablet Take by mouth. (Patient not taking: Reported on 09/02/2019)   . telmisartan (MICARDIS) 80 MG tablet TAKE 1 TABLET BY MOUTH  DAILY   . triamcinolone cream (KENALOG)  0.1 % APPLY TO AFFECTED AREA TWICE A DAY    No facility-administered encounter medications on file as of 09/22/2019.     Objective:  Lab Results  Component Value Date   HGBA1C 5.0 04/06/2019   HGBA1C 5.1 06/24/2018   HGBA1C 4.9 02/18/2018   Lab Results  Component Value Date   MICROALBUR 30 11/18/2017   LDLCALC 65 11/16/2018   CREATININE 1.12 (H) 08/08/2019   BP Readings from Last 3 Encounters:  08/08/19 (!) 140/80  06/28/19 (!) 166/98  06/16/19 140/86     Goals Addressed      Patient Stated   .  "I would like to new power Wheelchair to get around better outside of my home" (pt-stated)        [name] (see longitudinal plan of care for additional care plan information)  Current Barriers:  [name] Knowledge Deficits related to determination for best DME to help improve mobility  . Chronic Disease Management support and education needs related to Diabetes Mellitus with stage 3 Chronic Kidney disease, Parenchymal renal hypertension  . Impaired Physical Mobility   Nurse Case Manager Clinical Goal(s):  [name] Over the next 90 days, patient will verbalize understanding of plan for evaluation and determination for best DME to help improve patient's mobility, manual W/C, verses electric W/C, verses power Scooter   CCM RN CM Interventions:  09/09/19 call completed with patient  . Inter-disciplinary care team collaboration (see longitudinal plan of care) . Evaluation of current treatment plan related to Impaired Physical Mobility and DME needs and patient's adherence to plan as established by provider. . Advised patient to discuss her DME needs with PCP at next OV in order for a face to face evaluation to be completed and PCP help make recommendations for best mechanical mobility option: Determined patient will benefit from having a power W/C verses a Scooter due to having home modifications with the assistance of [employer], discussed this will allow her to be mobile inside and outside of her home including a W/C ramp; Encouraged patient to reach out to [facility] to determine her out of pocket expense for power W/C or Scooter . Provided education to patient re: options to purchase or rent to purchase a manual W/C; Provided patient with the contact number for [facility] and advised this DME supplier will also assist with a W/C ramp if needed for temporary use until [employer] can install permanent ramp; Determined patient will  purchase a manual W/C out of pocket to allow Medicare reimbursement for power W/C; Determined patient will contact this DME supplier for this purchase ASAP  . Discussed plans with patient for ongoing care management follow up and provided patient with direct contact information for care management team 09/22/19 call completed with patient  . Determined patient is still in need of a manual W/C, discussed she will plan to privately rent a W/C and use her Medicare benefit for a power W/C; Confirmed [employer] completed her W/C ramp today . Discussed plan to contact [facility] to coordinate a face to face visit for the initiation of patient's request for a power W/C . Sent message via in basket communication to embedded [place] with an update 09/23/19 Outbound call placed to DME suppliers  . Placed outbound call to [place] [phone], spoke with Sharee Pimple regarding availability of a bariatric W/C for a private pay rental, determined the monthly rental cost is $51.75; determined Sharee Pimple is unable to verify this equipment is available  for patient and a call back is needed to confirm availability . Placed outbound call to [place], spoke with Elberta Fortis regarding private pay monthly rental for a bariatric W/C; Determined the patient will need a size 24 to safely accommodate her size; Discussed the monthly private rental is $85; Discussed Elberta Fortis will need to confirm this equipment is in stock and give this RN a call back to advise; [facility] has the Bariatric W/C in stock to accommodate the patient's weight capacity . Placed outbound call to [place], discussed this RN is assisting patient with renting a manual W/C; advised her W/C ramp has been installed . Determined Larena Glassman RN has completed a face to face visit to assess patient for a power W/C; Determined Larena Glassman RN will assist this RN with coordination of DME, patient advised accordingly . Determined from  patient she will rent a manual W/C from [place] and will have her neighbor pick up the W/C tomorrow am: Determined patient will use [facility] to purchase her power W/C  . Placed outbound call to [facility] with [place] to advise and discuss next steps for setting up patients account . Placed outbound call to [place] requesting a return call to advise of next steps for ordering patient a power W/C . Received call from [place] with [facility], determined all documentation for patient's power W/C needs to be faxed to [facility] fax # [phone], notified Larena Glassman RN with [facility]  . Placed outbound call to [place] with [facility], left a detailed voice message providing her with the contact name/fax# to send all clinical documentation for patient's power W/C  Patient Self Care Activities:  . Self administers medications as prescribed . Attends all scheduled provider appointments . Calls pharmacy for medication refills . Performs ADL's independently . Performs IADL's independently . Calls provider office for new concerns or questions  Please see past updates related to this goal by clicking on the "Past Updates" button in the selected goal        Plan:   Telephone follow up appointment with care management team member scheduled for: 09/24/19  Barb Merino, RN, BSN, CCM Care Management Coordinator [place] [facility]  Direct Phone: [phone]

## 2019-09-24 ENCOUNTER — Telehealth: Payer: Medicare Other

## 2019-09-24 ENCOUNTER — Telehealth: Payer: Self-pay

## 2019-09-24 NOTE — Telephone Encounter (Cosign Needed)
  Chronic Care Management   Outreach Note  09/24/2019 Name: Mary [name] MRN: [id number] DOB: 01/27/1945  Referred by: Dorothyann Peng, MD Reason for referral : Chronic Care Management (FU RN CM Call )   An unsuccessful telephone outreach to patient was attempted today. The patient was referred to the case management team for assistance with care management and care coordination.   An unsuccessful telephone outreach was made to [facility] with [employer]. Left voice message requesting a return phone call to advise on how to establish an account with [name] for her W/C rental.    Follow Up Plan: Telephone follow up appointment with care management team member scheduled for: 09/30/19  Delsa Sale, RN, BSN, CCM Care Management Coordinator [facility] Management/[facility]  Direct Phone: [phone]

## 2019-09-24 NOTE — Progress Notes (Signed)
This encounter was created in error - please disregard.

## 2019-09-25 DIAGNOSIS — Z96651 Presence of right artificial knee joint: Secondary | ICD-10-CM | POA: Diagnosis not present

## 2019-09-25 DIAGNOSIS — E1122 Type 2 diabetes mellitus with diabetic chronic kidney disease: Secondary | ICD-10-CM | POA: Diagnosis not present

## 2019-09-25 DIAGNOSIS — N183 Chronic kidney disease, stage 3 unspecified: Secondary | ICD-10-CM | POA: Diagnosis not present

## 2019-09-25 DIAGNOSIS — L049 Acute lymphadenitis, unspecified: Secondary | ICD-10-CM | POA: Diagnosis not present

## 2019-09-25 DIAGNOSIS — I890 Lymphedema, not elsewhere classified: Secondary | ICD-10-CM | POA: Diagnosis not present

## 2019-09-25 DIAGNOSIS — M1712 Unilateral primary osteoarthritis, left knee: Secondary | ICD-10-CM | POA: Diagnosis not present

## 2019-09-25 DIAGNOSIS — R531 Weakness: Secondary | ICD-10-CM | POA: Diagnosis not present

## 2019-09-25 DIAGNOSIS — I129 Hypertensive chronic kidney disease with stage 1 through stage 4 chronic kidney disease, or unspecified chronic kidney disease: Secondary | ICD-10-CM | POA: Diagnosis not present

## 2019-09-25 DIAGNOSIS — Z7982 Long term (current) use of aspirin: Secondary | ICD-10-CM | POA: Diagnosis not present

## 2019-09-25 DIAGNOSIS — Z9181 History of falling: Secondary | ICD-10-CM | POA: Diagnosis not present

## 2019-09-28 ENCOUNTER — Ambulatory Visit (INDEPENDENT_AMBULATORY_CARE_PROVIDER_SITE_OTHER): Payer: Medicare Other | Admitting: Ophthalmology

## 2019-09-28 ENCOUNTER — Other Ambulatory Visit: Payer: Self-pay | Admitting: Internal Medicine

## 2019-09-28 ENCOUNTER — Other Ambulatory Visit: Payer: Self-pay

## 2019-09-28 ENCOUNTER — Encounter (INDEPENDENT_AMBULATORY_CARE_PROVIDER_SITE_OTHER): Payer: Self-pay | Admitting: Ophthalmology

## 2019-09-28 DIAGNOSIS — H34813 Central retinal vein occlusion, bilateral, with macular edema: Secondary | ICD-10-CM | POA: Diagnosis not present

## 2019-09-28 DIAGNOSIS — I10 Essential (primary) hypertension: Secondary | ICD-10-CM

## 2019-09-28 DIAGNOSIS — H401111 Primary open-angle glaucoma, right eye, mild stage: Secondary | ICD-10-CM

## 2019-09-28 DIAGNOSIS — E113313 Type 2 diabetes mellitus with moderate nonproliferative diabetic retinopathy with macular edema, bilateral: Secondary | ICD-10-CM | POA: Diagnosis not present

## 2019-09-28 DIAGNOSIS — H35033 Hypertensive retinopathy, bilateral: Secondary | ICD-10-CM | POA: Diagnosis not present

## 2019-09-28 DIAGNOSIS — Z961 Presence of intraocular lens: Secondary | ICD-10-CM

## 2019-09-28 DIAGNOSIS — H3581 Retinal edema: Secondary | ICD-10-CM

## 2019-09-28 DIAGNOSIS — H25812 Combined forms of age-related cataract, left eye: Secondary | ICD-10-CM

## 2019-09-28 MED ORDER — TRIAMCINOLONE ACETONIDE 40 MG/ML IO SUSP
4.0000 mg | INTRAOCULAR | Status: AC | PRN
  Administered 2019-09-28: 4 mg via INTRAVITREAL

## 2019-09-30 ENCOUNTER — Ambulatory Visit: Payer: Self-pay

## 2019-09-30 ENCOUNTER — Other Ambulatory Visit: Payer: Self-pay

## 2019-09-30 ENCOUNTER — Telehealth: Payer: Medicare Other

## 2019-09-30 DIAGNOSIS — M25561 Pain in right knee: Secondary | ICD-10-CM | POA: Diagnosis not present

## 2019-09-30 DIAGNOSIS — N183 Chronic kidney disease, stage 3 unspecified: Secondary | ICD-10-CM

## 2019-09-30 DIAGNOSIS — M150 Primary generalized (osteo)arthritis: Secondary | ICD-10-CM | POA: Diagnosis not present

## 2019-09-30 DIAGNOSIS — I129 Hypertensive chronic kidney disease with stage 1 through stage 4 chronic kidney disease, or unspecified chronic kidney disease: Secondary | ICD-10-CM

## 2019-09-30 DIAGNOSIS — M25512 Pain in left shoulder: Secondary | ICD-10-CM | POA: Diagnosis not present

## 2019-09-30 DIAGNOSIS — G894 Chronic pain syndrome: Secondary | ICD-10-CM | POA: Diagnosis not present

## 2019-09-30 DIAGNOSIS — R600 Localized edema: Secondary | ICD-10-CM

## 2019-09-30 DIAGNOSIS — E1122 Type 2 diabetes mellitus with diabetic chronic kidney disease: Secondary | ICD-10-CM

## 2019-09-30 NOTE — Patient Instructions (Signed)
Visit Information  Goals Addressed      Patient Stated   .  "to get the swelling down in my legs" (pt-stated)        CARE PLAN ENTRY (see longitudinal plan of care for additional care plan information)  Current Barriers:  [name] Knowledge Deficits related to evaluation and treatment of bilateral lymphedema  . Chronic Disease Management support and education needs related to Diabetes Mellitus with stage 3 Chronic Kidney disease, Parenchymal renal hypertension   Nurse Case Manager Clinical Goal(s):  [name] Over the next 30 days, patient will complete her new patient appointment with the [facility] for evaluation and treatment of lymphedema to her lower extremities  . Over the next 90 days, patient will work with the CCM team and PCP to address needs related to disease education and support to help improve Self Health management of Lymphedema  CCM RN CM Interventions:  09/09/19 call completed with patient  . Inter-disciplinary care team collaboration (see longitudinal plan of care) . Evaluation of current treatment plan related to right foot ulceration and bilateral lower leg lymphedema and patient's adherence to plan as established by provider . Determined patient is established with Dr. Marylene Land DPM for care of her right foot ulcer. Her last OV completed on 07/06/19 is noted with the following Assessment/Plan:  . Problem List Items Addressed This Visit  .  Visit Diagnoses  .    [name] Pre-ulcerative calluses    -  Primary .    [name] Pain due to onychomycosis of toenails of both feet     .    [name] PVD (peripheral vascular disease) (HCC)     .    [name] Foot ulcer, right, limited to breakdown of skin (HCC)     .    [name] Diabetic polyneuropathy associated with type 2 diabetes mellitus (HCC)     .    . Lymphedema       . -Examined patient and discussed the progression of the pinpoint ulceration and chronic edema . -Mechanically dedbrided ulcerated callus and callus skin bilateral using a sterile chisel  blade, no opening to right foot. Betadine applied to protect skin on the right cover with a Band-Aid and advised patient if she would benefit from lymphedema management with wound center as well as then helping to care for the right foot ulceration to prevent it from getting worse.  . -Continue with offloading padding to shoes bilateral . -At no additional charge mechanically debrided nails using sterile nail nipper without incident  . -Patient to return to office in 6 weeks for wound/callus check or sooner if problems arise . Determined patient completed a f/u visit at the [facility] on 07/27/19 for evaluation and treatment of lymphadema . Currently patient is wearing compression stockings and elevating extremities but reports lymphedema has worsened; Discussed RN from [facility] will make a home visit on 09/13/19 . Collaborated with PCP Dr. Dorothyann Peng regarding patient's request to be evaluated for mechanical DME such as an electric W/C and or power Scooter; Advised of patient reported worsening lymphedema; Advised RN nurse visit from [facility] is scheduled for 09/13/19; Discussed having a [facility] referral placed for ongoing clinical support and advised PCP, sending a [facility] referral will not delay or prevent services from [facility], Advised patient will need her PCP AWV rescheduled due to having previous visit cancelled for unknown reason    . Discussed plans with patient for ongoing care management  follow up and provided patient with direct contact information for care management team 09/30/19 RN CM Collaboration with [facility] nurse Bethann Berkshire RN . Received and reviewed the following patient update from [facility] nurse Bethann Berkshire RN  . Patient is scheduled with the lymphedema Clinic on Thursday, September 23 @1 :30 pm  Patient Self Care Activities:  . Self administers medications as prescribed . Attends all scheduled provider appointments . Calls  pharmacy for medication refills . Calls provider office for new concerns or questions  Please see past updates related to this goal by clicking on the "Past Updates" button in the selected goal        Patient verbalizes understanding of instructions provided today.   Telephone follow up appointment with care management team member scheduled for: 10/14/19  10/16/19, RN, BSN, CCM Care Management Coordinator [facility] Care Management/[facility]  Direct Phone: [phone]

## 2019-09-30 NOTE — Chronic Care Management (AMB) (Signed)
Chronic Care Management   Follow Up Note   09/30/2019 Name: Mary [name] MRN: [id number] DOB: 02-Dec-1945  Referred by: Glendale Chard, MD Reason for referral : Chronic Care Management (RN CM Case Collaboration )   Mary [name] is a 74 y.o. year old female who is a primary care patient of Glendale Chard, MD. The CCM team was consulted for assistance with chronic disease management and care coordination needs.    Review of patient status, including review of consultants reports, relevant laboratory and other test results, and collaboration with appropriate care team members and the patient's provider was performed as part of comprehensive patient evaluation and provision of chronic care management services.    SDOH (Social Determinants of Health) assessments performed: No See Care Plan activities for detailed interventions related to [facility])   Received and reviewed message from [facility] nurse Larena Glassman RN regarding patient's f/u with the Lymphedema Clinic.     Outpatient Encounter Medications as of 09/30/2019  Medication Sig Note  . ACCU-CHEK AVIVA PLUS test strip Use as directed to check blood sugars 1 time per day dx: e11.22   . aspirin EC 81 MG tablet Take 81 mg by mouth daily.   [name] 28G MISC    . Blood Glucose Monitoring Suppl (ACCU-CHEK AVIVA PLUS) w/Device KIT Use as directed to check blood sugars 2 times per day dx: e11.65   . cetirizine (ZYRTEC) 10 MG tablet TAKE 1 TABLET BY MOUTH EVERY DAY   . dorzolamide-timolol (COSOPT) 22.3-6.8 MG/ML ophthalmic solution Place 1 drop into the right eye 2 (two) times daily.   . fluorometholone (FML) 0.1 % ophthalmic suspension Place 1 drop into the right eye 3 (three) times daily. (Patient not taking: Reported on 09/02/2019)   . fluticasone (FLONASE) 50 MCG/ACT nasal spray SPRAY 1 SPRAY INTO EACH NOSTRIL EVERY DAY   . furosemide (LASIX) 40 MG tablet Take 1 tablet (40 mg total) by mouth daily.   [name]  HYDROcodone-acetaminophen (NORCO) 10-325 MG tablet Take 1 tablet by mouth 5 (five) times daily as needed.   [name] KLOR-CON M20 20 MEQ tablet Take 20 mEq by mouth daily. with food (Patient not taking: Reported on 09/02/2019) 02/23/2018: Patient instructed to hold this medication due to abnormal Potassium level of 6.2 on 02/18/18, per MD, Dr. Glendale Chard. Patient was given verbal education related to complications from hypo and hyperkalemia and importance of following MD recommendations. Patient instructed to resume this medication when directed by PCP. Patient verbalizes understanding.   [name] levothyroxine (SYNTHROID) 125 MCG tablet Take 125 mcg by mouth daily before breakfast.   . loratadine (CLARITIN) 10 MG tablet Take 10 mg by mouth daily. (Patient not taking: Reported on 09/02/2019)   . magnesium oxide (MAG-OX) 400 MG tablet TAKE 1 TABLET BY MOUTH EVERY EVENING   . metoprolol tartrate (LOPRESSOR) 50 MG tablet Take 50 mg by mouth 2 (two) times daily.    [name] omeprazole (PRILOSEC) 20 MG capsule TAKE 1 CAPSULE BY MOUTH  DAILY   . pravastatin (PRAVACHOL) 40 MG tablet TAKE 1 TABLET BY MOUTH EVERY DAY   . prednisoLONE acetate (PRED FORTE) 1 % ophthalmic suspension Place 1 drop into the right eye 3 (three) times daily. (Patient not taking: Reported on 09/02/2019)   . predniSONE (DELTASONE) 10 MG tablet Take by mouth. (Patient not taking: Reported on 09/02/2019)   . telmisartan (MICARDIS) 80 MG tablet TAKE 1 TABLET BY MOUTH  DAILY   . triamcinolone cream (KENALOG) 0.1 % APPLY TO  AFFECTED AREA TWICE A DAY    No facility-administered encounter medications on file as of 09/30/2019.     Objective:  Lab Results  Component Value Date   HGBA1C 5.0 04/06/2019   HGBA1C 5.1 06/24/2018   HGBA1C 4.9 02/18/2018   Lab Results  Component Value Date   MICROALBUR 30 11/18/2017   LDLCALC 65 11/16/2018   CREATININE 1.12 (H) 08/08/2019   BP Readings from Last 3 Encounters:  08/08/19 (!) 140/80  06/28/19 (!) 166/98  06/16/19  140/86    Goals Addressed              This Visit's Progress     Patient Stated   .  "to get the swelling down in my legs" (pt-stated)        CARE PLAN ENTRY (see longitudinal plan of care for additional care plan information)  Current Barriers:  [name] Knowledge Deficits related to evaluation and treatment of bilateral lymphedema  . Chronic Disease Management support and education needs related to Diabetes Mellitus with stage 3 Chronic Kidney disease, Parenchymal renal hypertension   Nurse Case Manager Clinical Goal(s):  [name] Over the next 30 days, patient will complete her new patient appointment with the [facility] for evaluation and treatment of lymphedema to her lower extremities  . Over the next 90 days, patient will work with the CCM team and PCP to address needs related to disease education and support to help improve Self Health management of Lymphedema  CCM RN CM Interventions:  09/09/19 call completed with patient  . Inter-disciplinary care team collaboration (see longitudinal plan of care) . Evaluation of current treatment plan related to right foot ulceration and bilateral lower leg lymphedema and patient's adherence to plan as established by provider . Determined patient is established with Dr. Cannon Kettle DPM for care of her right foot ulcer. Her last OV completed on 07/06/19 is noted with the following Assessment/Plan:  . Problem List Items Addressed This Visit  .  Visit Diagnoses  .    [name] Pre-ulcerative calluses    -  Primary .    [name] Pain due to onychomycosis of toenails of both feet     .    [name] PVD (peripheral vascular disease) ([place])     .    [name] Foot ulcer, right, limited to breakdown of skin ([place])     .    [name] Diabetic polyneuropathy associated with type 2 diabetes mellitus ([place])     .    . Lymphedema       . -Examined patient and discussed the progression of the pinpoint ulceration and chronic edema . -Mechanically dedbrided ulcerated callus and callus skin  bilateral using a sterile chisel blade, no opening to right foot. Betadine applied to protect skin on the right cover with a Band-Aid and advised patient if she would benefit from lymphedema management with wound center as well as then helping to care for the right foot ulceration to prevent it from getting worse.  . -Continue with offloading padding to shoes bilateral . -At no additional charge mechanically debrided nails using sterile nail nipper without incident  . -Patient to return to office in 6 weeks for wound/callus check or sooner if problems arise . Determined patient completed a f/u visit at the [facility] on 07/27/19 for evaluation and treatment of lymphadema . Currently patient is wearing compression stockings and elevating extremities but reports lymphedema has worsened; Discussed RN from [facility] will make a  home visit on 09/13/19 . Collaborated with PCP Dr. Glendale Chard regarding patient's request to be evaluated for mechanical DME such as an electric W/C and or power Scooter; Advised of patient reported worsening lymphedema; Advised RN nurse visit from [place] is scheduled for 09/13/19; Discussed having a [facility] referral placed for ongoing clinical support and advised PCP, sending a [facility] referral will not delay or prevent services from [facility], Advised patient will need her PCP AWV rescheduled due to having previous visit cancelled for unknown reason    . Discussed plans with patient for ongoing care management follow up and provided patient with direct contact information for care management team 09/30/19 RN CM Collaboration with [facility] nurse Larena Glassman RN . Received and reviewed the following patient update from [place]  . Patient is scheduled with the lymphedema Clinic on Thursday, September 23 @1 :30 pm  Patient Self Care Activities:  . Self administers medications as prescribed . Attends all scheduled  provider appointments . Calls pharmacy for medication refills . Calls provider office for new concerns or questions  Please see past updates related to this goal by clicking on the "Past Updates" button in the selected goal        Plan:   Telephone follow up appointment with care management team member scheduled for: 10/14/19  Barb Merino, RN, BSN, CCM Care Management Coordinator [place] [facility]  Direct Phone: [phone]

## 2019-10-04 ENCOUNTER — Other Ambulatory Visit: Payer: Self-pay

## 2019-10-04 MED ORDER — FUROSEMIDE 40 MG PO TABS: 40.0000 mg | ORAL_TABLET | Freq: Every day | ORAL | 2 refills | Status: DC

## 2019-10-05 ENCOUNTER — Telehealth: Payer: Medicare Other

## 2019-10-05 ENCOUNTER — Other Ambulatory Visit: Payer: Self-pay

## 2019-10-05 ENCOUNTER — Ambulatory Visit: Payer: Self-pay

## 2019-10-05 DIAGNOSIS — R600 Localized edema: Secondary | ICD-10-CM

## 2019-10-05 DIAGNOSIS — E1122 Type 2 diabetes mellitus with diabetic chronic kidney disease: Secondary | ICD-10-CM

## 2019-10-05 DIAGNOSIS — N183 Chronic kidney disease, stage 3 unspecified: Secondary | ICD-10-CM

## 2019-10-05 DIAGNOSIS — I129 Hypertensive chronic kidney disease with stage 1 through stage 4 chronic kidney disease, or unspecified chronic kidney disease: Secondary | ICD-10-CM

## 2019-10-06 ENCOUNTER — Encounter: Payer: Medicare Other | Admitting: Internal Medicine

## 2019-10-06 ENCOUNTER — Other Ambulatory Visit: Payer: Self-pay

## 2019-10-06 MED ORDER — ACCU-CHEK AVIVA PLUS W/DEVICE KIT: PACK | 1 refills | Status: DC

## 2019-10-06 NOTE — Chronic Care Management (AMB) (Signed)
Chronic Care Management   Follow Up Note   10/05/2019 Name: LEORA PLATT MRN: [id number] DOB: 03-28-1945  Referred by: [place] Chard, MD Reason for referral : Chronic Care Management (FU RN CM Case Collaboration )   JUNELLE HASHEMI is a 74 y.o. year old female who is a primary care patient of [place] Chard, MD. The CCM team was consulted for assistance with chronic disease management and care coordination needs.    Review of patient status, including review of consultants reports, relevant laboratory and other test results, and collaboration with appropriate care team members and the patient's provider was performed as part of comprehensive patient evaluation and provision of chronic care management services.    SDOH (Social Determinants of Health) assessments performed: No See Care Plan activities for detailed interventions related to [facility])   Collaborative call completed with the [facility] nurse Larena Glassman RN concerning patient's mobility status and lymphedema plan of care.     Outpatient Encounter Medications as of 10/05/2019  Medication Sig Note  . ACCU-CHEK AVIVA PLUS test strip Use as directed to check blood sugars 1 time per day dx: e11.22   . aspirin EC 81 MG tablet Take 81 mg by mouth daily.   [name] 28G MISC    . cetirizine (ZYRTEC) 10 MG tablet TAKE 1 TABLET BY MOUTH EVERY DAY   . dorzolamide-timolol (COSOPT) 22.3-6.8 MG/ML ophthalmic solution Place 1 drop into the right eye 2 (two) times daily.   . fluorometholone (FML) 0.1 % ophthalmic suspension Place 1 drop into the right eye 3 (three) times daily. (Patient not taking: Reported on 09/02/2019)   . fluticasone (FLONASE) 50 MCG/ACT nasal spray SPRAY 1 SPRAY INTO EACH NOSTRIL EVERY DAY   . furosemide (LASIX) 40 MG tablet Take 1 tablet (40 mg total) by mouth daily.   [name] HYDROcodone-acetaminophen (NORCO) 10-325 MG tablet Take 1 tablet by mouth 5 (five) times daily as needed.   [name] KLOR-CON M20 20 MEQ tablet  Take 20 mEq by mouth daily. with food (Patient not taking: Reported on 09/02/2019) 02/23/2018: Patient instructed to hold this medication due to abnormal Potassium level of 6.2 on 02/18/18, per MD, Dr. [place] Chard. Patient was given verbal education related to complications from hypo and hyperkalemia and importance of following MD recommendations. Patient instructed to resume this medication when directed by PCP. Patient verbalizes understanding.   [name] levothyroxine (SYNTHROID) 125 MCG tablet Take 125 mcg by mouth daily before breakfast.   . loratadine (CLARITIN) 10 MG tablet Take 10 mg by mouth daily. (Patient not taking: Reported on 09/02/2019)   . magnesium oxide (MAG-OX) 400 MG tablet TAKE 1 TABLET BY MOUTH EVERY EVENING   . metoprolol tartrate (LOPRESSOR) 50 MG tablet Take 50 mg by mouth 2 (two) times daily.    [name] omeprazole (PRILOSEC) 20 MG capsule TAKE 1 CAPSULE BY MOUTH  DAILY   . pravastatin (PRAVACHOL) 40 MG tablet TAKE 1 TABLET BY MOUTH EVERY DAY   . prednisoLONE acetate (PRED FORTE) 1 % ophthalmic suspension Place 1 drop into the right eye 3 (three) times daily. (Patient not taking: Reported on 09/02/2019)   . predniSONE (DELTASONE) 10 MG tablet Take by mouth. (Patient not taking: Reported on 09/02/2019)   . telmisartan (MICARDIS) 80 MG tablet TAKE 1 TABLET BY MOUTH  DAILY   . triamcinolone cream (KENALOG) 0.1 % APPLY TO AFFECTED AREA TWICE A DAY   . [DISCONTINUED] Blood Glucose Monitoring Suppl (ACCU-CHEK AVIVA PLUS) w/Device KIT Use as directed to  check blood sugars 2 times per day dx: e11.65    No facility-administered encounter medications on file as of 10/05/2019.     Objective:  Lab Results  Component Value Date   HGBA1C 5.0 04/06/2019   HGBA1C 5.1 06/24/2018   HGBA1C 4.9 02/18/2018   Lab Results  Component Value Date   MICROALBUR 30 11/18/2017   LDLCALC 65 11/16/2018   CREATININE 1.12 (H) 08/08/2019   BP Readings from Last 3 Encounters:  08/08/19 (!) 140/80  06/28/19 (!)  166/98  06/16/19 140/86    Goals Addressed      Patient Stated   .  "I would like to new power Wheelchair to get around better outside of my home" (pt-stated)   On track     [place] (see longitudinal plan of care for additional care plan information)  Current Barriers:  [name] Knowledge Deficits related to determination for best DME to help improve mobility  . Chronic Disease Management support and education needs related to Diabetes Mellitus with stage 3 Chronic Kidney disease, Parenchymal renal hypertension  . Impaired Physical Mobility   Nurse Case Manager Clinical Goal(s):  [name] Over the next 90 days, patient will verbalize understanding of plan for evaluation and determination for best DME to help improve patient's mobility, manual W/C, verses electric W/C, verses power Scooter   CCM RN CM Interventions:  10/05/19 Collaborative call with [facility] nurse . Inter-disciplinary care team collaboration (see longitudinal plan of care) . Evaluation of current treatment plan related to Impaired Physical Mobility and DME needs and patient's adherence to plan as established by provider . Spoke with [facility] nurse Larena Glassman RN regarding patient's mobility issues . Confirmed patient received her manual W/C and is able to use this chair when needed to leave the home for MD appointments . Confirmed patient was able to make her f/u with Retina specialist last week as scheduled . Determined [facility] faxed the appropriate clinical notes to [place] for initiation of patient's purchase of a power W/C  Patient Self Care Activities:  . Self administers medications as prescribed . Attends all scheduled provider appointments . Calls pharmacy for medication refills . Calls provider office for new concerns or questions  Please see past updates related to this goal by clicking on the "Past Updates" button in the selected goal      .  "to get the swelling down in my legs"  (pt-stated)   Not on track     [place] (see longitudinal plan of care for additional care plan information)  Current Barriers:  [name] Knowledge Deficits related to evaluation and treatment of bilateral lymphedema  . Chronic Disease Management support and education needs related to Diabetes Mellitus with stage 3 Chronic Kidney disease, Parenchymal renal hypertension   Nurse Case Manager Clinical Goal(s):  [name] Over the next 30 days, patient will complete her new patient appointment with the [facility] for evaluation and treatment of lymphedema to her lower extremities  . Over the next 90 days, patient will work with the CCM team and PCP to address needs related to disease education and support to help improve Self Health management of Lymphedema  CCM RN CM Interventions:  09/09/19 call completed with patient  . Inter-disciplinary care team collaboration (see longitudinal plan of care) . Evaluation of current treatment plan related to right foot ulceration and bilateral lower leg lymphedema and patient's adherence to plan as established by provider . Determined patient is established  with Dr. Cannon Kettle DPM for care of her right foot ulcer. Her last OV completed on 07/06/19 is noted with the following Assessment/Plan:  . Problem List Items Addressed This Visit  .  Visit Diagnoses  .    [name] Pre-ulcerative calluses    -  Primary .    [name] Pain due to onychomycosis of toenails of both feet     .    [name] PVD (peripheral vascular disease) ([place])     .    [name] Foot ulcer, right, limited to breakdown of skin ([place])     .    [name] Diabetic polyneuropathy associated with type 2 diabetes mellitus ([place])     .    . Lymphedema       . -Examined patient and discussed the progression of the pinpoint ulceration and chronic edema . -Mechanically dedbrided ulcerated callus and callus skin bilateral using a sterile chisel blade, no opening to right foot. Betadine applied to protect skin on the right cover with a  Band-Aid and advised patient if she would benefit from lymphedema management with wound center as well as then helping to care for the right foot ulceration to prevent it from getting worse.  . -Continue with offloading padding to shoes bilateral . -At no additional charge mechanically debrided nails using sterile nail nipper without incident  . -Patient to return to office in 6 weeks for wound/callus check or sooner if problems arise . Determined patient completed a f/u visit at the [facility] on 07/27/19 for evaluation and treatment of lymphadema . Currently patient is wearing compression stockings and elevating extremities but reports lymphedema has worsened; Discussed RN from [place] will make a home visit on 09/13/19 . Collaborated with PCP Dr. [place] Chard regarding patient's request to be evaluated for mechanical DME such as an electric W/C and or power Scooter; Advised of patient reported worsening lymphedema; Advised RN nurse visit from [place] is scheduled for 09/13/19; Discussed having a [facility] referral placed for ongoing clinical support and advised PCP, sending a [facility] referral will not delay or prevent services from [facility], Advised patient will need her PCP AWV rescheduled due to having previous visit cancelled for unknown reason    . Discussed plans with patient for ongoing care management follow up and provided patient with direct contact information for care management team 10/05/19 RN CM Collaboration with [facility] nurse Larena Glassman RN . Received and reviewed the following patient update from [place]  . Patient is scheduled with the lymphedema Clinic on Thursday, September 23 @1 :30 pm . Determined [facility] will discharge patient from PT due to f/u an outpatient clinic such as the lymphedema clinic . Determined patient's lymphedema is severe and this is preventing her from being able to lift her legs  independently in order to be able to get into her bed and or elevate her extremities independently . Determined patient's granddaughter is staying with the patient, however the patient will not ask for help and often sleeps on the edge of her bed with her legs in a downward position . Discussed Larena Glassman RN has educated patient on the adverse effects this will have for the lymphedema and may impair of prevent resolution of the lymphedema . Determined Larena Glassman RN will re-evaluate next steps following patient's f/u with the lymphedema clinic scheduled for next week . Determined the [facility] PT will work with the patient once weekly since she will be  d[facility]c from [facility] . Discussed patient may need inpatient admission if she is unable to comply with the treatment recommendations provided by the lymphedema clinic   Patient Self Care Activities:  . Self administers medications as prescribed . Attends all scheduled provider appointments . Calls pharmacy for medication refills . Calls provider office for new concerns or questions  Please see past updates related to this goal by clicking on the "Past Updates" button in the selected goal        Plan:   Telephone follow up appointment with care management team member scheduled for: 10/14/19  Barb Merino, RN, BSN, CCM Care Management Coordinator [facility]  Direct Phone: [phone]

## 2019-10-06 NOTE — Patient Instructions (Signed)
Visit Information  Goals Addressed      Patient Stated   .  "I would like to new power Wheelchair to get around better outside of my home" (pt-stated)   On track     CARE PLAN ENTRY (see longitudinal plan of care for additional care plan information)  Current Barriers:  [name] Knowledge Deficits related to determination for best DME to help improve mobility  . Chronic Disease Management support and education needs related to Diabetes Mellitus with stage 3 Chronic Kidney disease, Parenchymal renal hypertension  . Impaired Physical Mobility   Nurse Case Manager Clinical Goal(s):  [name] Over the next 90 days, patient will verbalize understanding of plan for evaluation and determination for best DME to help improve patient's mobility, manual W/C, verses electric W/C, verses power Scooter   CCM RN CM Interventions:  10/05/19 Collaborative call with [facility] nurse . Inter-disciplinary care team collaboration (see longitudinal plan of care) . Evaluation of current treatment plan related to Impaired Physical Mobility and DME needs and patient's adherence to plan as established by provider . Spoke with [facility] nurse Bethann Berkshire RN regarding patient's mobility issues . Confirmed patient received her manual W/C and is able to use this chair when needed to leave the home for MD appointments . Confirmed patient was able to make her f/u with Retina specialist last week as scheduled . Determined [facility] faxed the appropriate clinical notes to [facility] for initiation of patient's purchase of a power W/C  Patient Self Care Activities:  . Self administers medications as prescribed . Attends all scheduled provider appointments . Calls pharmacy for medication refills . Calls provider office for new concerns or questions  Please see past updates related to this goal by clicking on the "Past Updates" button in the selected goal      .  "to get the swelling down in my legs" (pt-stated)    Not on track     CARE PLAN ENTRY (see longitudinal plan of care for additional care plan information)  Current Barriers:  [name] Knowledge Deficits related to evaluation and treatment of bilateral lymphedema  . Chronic Disease Management support and education needs related to Diabetes Mellitus with stage 3 Chronic Kidney disease, Parenchymal renal hypertension   Nurse Case Manager Clinical Goal(s):  [name] Over the next 30 days, patient will complete her new patient appointment with the [facility] for evaluation and treatment of lymphedema to her lower extremities  . Over the next 90 days, patient will work with the CCM team and PCP to address needs related to disease education and support to help improve Self Health management of Lymphedema  CCM RN CM Interventions:  09/09/19 call completed with patient  . Inter-disciplinary care team collaboration (see longitudinal plan of care) . Evaluation of current treatment plan related to right foot ulceration and bilateral lower leg lymphedema and patient's adherence to plan as established by provider . Determined patient is established with Dr. Marylene Land DPM for care of her right foot ulcer. Her last OV completed on 07/06/19 is noted with the following Assessment/Plan:  . Problem List Items Addressed This Visit  .  Visit Diagnoses  .    [name] Pre-ulcerative calluses    -  Primary .    [name] Pain due to onychomycosis of toenails of both feet     .    [name] PVD (peripheral vascular disease) ([facility])     .    [name] Foot ulcer, right, limited to  breakdown of skin ([facility])     .    [name] Diabetic polyneuropathy associated with type 2 diabetes mellitus ([facility])     .    . Lymphedema       . -Examined patient and discussed the progression of the pinpoint ulceration and chronic edema . -Mechanically dedbrided ulcerated callus and callus skin bilateral using a sterile chisel blade, no opening to right foot. Betadine applied to protect skin on the right cover with a Band-Aid and  advised patient if she would benefit from lymphedema management with wound center as well as then helping to care for the right foot ulceration to prevent it from getting worse.  . -Continue with offloading padding to shoes bilateral . -At no additional charge mechanically debrided nails using sterile nail nipper without incident  . -Patient to return to office in 6 weeks for wound/callus check or sooner if problems arise . Determined patient completed a f/u visit at [facility] on 07/[facility]/19 for evaluation and treatment of lymphadema . Currently patient is wearing compression stockings and elevating extremities but reports lymphedema has worsened; Discussed RN from [facility] will make a home visit on 09/13/19 . Collaborated with PCP Dr. Dorothyann Peng regarding patient's request to be evaluated for mechanical DME such as an electric W/C and or power Scooter; Advised of patient reported worsening lymphedema; Advised RN nurse visit from [facility] is scheduled for 09/13/19; Discussed having a [facility] referral placed for ongoing clinical support and advised PCP, sending a [facility] referral will not delay or prevent services from [facility], Advised patient will need her PCP AWV rescheduled due to having previous visit cancelled for unknown reason    . Discussed plans with patient for ongoing care management follow up and provided patient with direct contact information for care management team 10/05/19 RN CM Collaboration with [facility] nurse Bethann Berkshire RN . Received and reviewed the following patient update from [facility] nurse Bethann Berkshire RN  . Patient is scheduled with the lymphedema Clinic on Thursday, September 23 @1 :30 pm . Determined [facility] will [facility] patient from PT due to f/u an outpatient clinic such as the lymphedema clinic . Determined patient's lymphedema is severe and this is preventing her from being able to lift her legs independently in  order to be able to get into her bed and or elevate her extremities independently . Determined patient's granddaughter is staying with the patient, however the patient will not ask for help and often sleeps on the edge of her bed with her legs in a downward position . Discussed RN has educated patient on the adverse effects this will have for the lymphedema and may impair of prevent resolution of the lymphedema . Determined Bethann Berkshire RN will re-evaluate next steps following patient's f/u with the lymphedema clinic scheduled for next week . Determined the [facility] PT will work with the patient once weekly since she will be d/c from [facility] . Discussed patient may need inpatient admission if she is unable to comply with the treatment recommendations provided by the lymphedema clinic   Patient Self Care Activities:  . Self administers medications as prescribed . Attends all scheduled provider appointments . Calls pharmacy for medication refills . Calls provider office for new concerns or questions  Please see past updates related to this goal by clicking on the "Past Updates" button in the selected goal        Patient verbalizes understanding of  instructions provided today.   Telephone follow up appointment with care management team member scheduled for: 10/14/19  Delsa Sale, RN, BSN, CCM Care Management Coordinator [facility]  Direct Phone: [phone]

## 2019-10-07 DIAGNOSIS — I890 Lymphedema, not elsewhere classified: Secondary | ICD-10-CM | POA: Diagnosis not present

## 2019-10-11 ENCOUNTER — Telehealth: Payer: Medicare Other

## 2019-10-11 ENCOUNTER — Ambulatory Visit: Payer: Self-pay

## 2019-10-11 ENCOUNTER — Other Ambulatory Visit: Payer: Self-pay

## 2019-10-11 DIAGNOSIS — N183 Chronic kidney disease, stage 3 unspecified: Secondary | ICD-10-CM

## 2019-10-11 DIAGNOSIS — E1122 Type 2 diabetes mellitus with diabetic chronic kidney disease: Secondary | ICD-10-CM

## 2019-10-11 DIAGNOSIS — R600 Localized edema: Secondary | ICD-10-CM

## 2019-10-11 DIAGNOSIS — I129 Hypertensive chronic kidney disease with stage 1 through stage 4 chronic kidney disease, or unspecified chronic kidney disease: Secondary | ICD-10-CM

## 2019-10-11 NOTE — Chronic Care Management (AMB) (Signed)
Chronic Care Management   Follow Up Note   10/11/2019 Name: Mary [name] MRN: [id number] DOB: [birth year]  Referred by: Glendale Chard, MD Reason for referral : Chronic Care Management (FU RN CM Case Collaboration )   Mary [name] is a 74 y.o. year old female who is a primary care patient of Glendale Chard, MD. The CCM team was consulted for assistance with chronic disease management and care coordination needs.    Review of patient status, including review of consultants reports, relevant laboratory and other test results, and collaboration with appropriate care team members and the patient's provider was performed as part of comprehensive patient evaluation and provision of chronic care management services.    SDOH (Social Determinants of Health) assessments performed: No See Care Plan activities for detailed interventions related to [facility])   RN Case Collaboration completed.     Outpatient Encounter Medications as of 10/11/2019  Medication Sig Note  . ACCU-CHEK AVIVA PLUS test strip Use as directed to check blood sugars 1 time per day dx: e11.22   . aspirin EC 81 MG tablet Take 81 mg by mouth daily.   [name] 28G MISC    . Blood Glucose Monitoring Suppl (ACCU-CHEK AVIVA PLUS) w/Device KIT Use as directed to check blood sugars 2 times per day dx: e11.65   . cetirizine (ZYRTEC) 10 MG tablet TAKE 1 TABLET BY MOUTH EVERY DAY   . dorzolamide-timolol (COSOPT) 22.3-6.8 MG/ML ophthalmic solution Place 1 drop into the right eye 2 (two) times daily.   . fluorometholone (FML) 0.1 % ophthalmic suspension Place 1 drop into the right eye 3 (three) times daily. (Patient not taking: Reported on 09/02/2019)   . fluticasone (FLONASE) 50 MCG/ACT nasal spray SPRAY 1 SPRAY INTO EACH NOSTRIL EVERY DAY   . furosemide (LASIX) 40 MG tablet Take 1 tablet (40 mg total) by mouth daily.   [name] HYDROcodone-acetaminophen (NORCO) 10-325 MG tablet Take 1 tablet by mouth 5 (five) times daily as  needed.   [name] KLOR-CON M20 20 MEQ tablet Take 20 mEq by mouth daily. with food (Patient not taking: Reported on 09/02/2019) 02/23/2018: Patient instructed to hold this medication due to abnormal Potassium level of 6.2 on 02/18/18, per MD, Dr. Glendale Chard. Patient was given verbal education related to complications from hypo and hyperkalemia and importance of following MD recommendations. Patient instructed to resume this medication when directed by PCP. Patient verbalizes understanding.   [name] levothyroxine (SYNTHROID) 125 MCG tablet Take 125 mcg by mouth daily before breakfast.   . loratadine (CLARITIN) 10 MG tablet Take 10 mg by mouth daily. (Patient not taking: Reported on 09/02/2019)   . magnesium oxide (MAG-OX) 400 MG tablet TAKE 1 TABLET BY MOUTH EVERY EVENING   . metoprolol tartrate (LOPRESSOR) 50 MG tablet Take 50 mg by mouth 2 (two) times daily.    [name] omeprazole (PRILOSEC) 20 MG capsule TAKE 1 CAPSULE BY MOUTH  DAILY   . pravastatin (PRAVACHOL) 40 MG tablet TAKE 1 TABLET BY MOUTH EVERY DAY   . prednisoLONE acetate (PRED FORTE) 1 % ophthalmic suspension Place 1 drop into the right eye 3 (three) times daily. (Patient not taking: Reported on 09/02/2019)   . predniSONE (DELTASONE) 10 MG tablet Take by mouth. (Patient not taking: Reported on 09/02/2019)   . telmisartan (MICARDIS) 80 MG tablet TAKE 1 TABLET BY MOUTH  DAILY   . triamcinolone cream (KENALOG) 0.1 % APPLY TO AFFECTED AREA TWICE A DAY    No facility-administered encounter medications  on file as of 10/11/2019.      Objective:  Lab Results  Component Value Date   HGBA1C 5.0 04/06/2019   HGBA1C 5.1 06/24/2018   HGBA1C 4.9 02/18/2018   Lab Results  Component Value Date   MICROALBUR 30 11/18/2017   LDLCALC 65 11/16/2018   CREATININE 1.12 (H) 08/08/2019   BP Readings from Last 3 Encounters:  08/08/19 (!) 140/80  06/28/19 (!) 166/98  06/16/19 140/86    Goals Addressed      Patient Stated   .  "I would like to new power Wheelchair to  get around better outside of my home" (pt-stated)        [place] (see longitudinal plan of care for additional care plan information)  Current Barriers:  [name] Knowledge Deficits related to determination for best DME to help improve mobility  . Chronic Disease Management support and education needs related to Diabetes Mellitus with stage 3 Chronic Kidney disease, Parenchymal renal hypertension  . Impaired Physical Mobility   Nurse Case Manager Clinical Goal(s):  [name] Over the next 90 days, patient will verbalize understanding of plan for evaluation and determination for best DME to help improve patient's mobility, manual W/C, verses electric W/C, verses power Scooter   CCM RN CM Interventions:  10/05/19 Collaborative call with [facility] nurse . Inter-disciplinary care team collaboration (see longitudinal plan of care) . Evaluation of current treatment plan related to Impaired Physical Mobility and DME needs and patient's adherence to plan as established by provider . Spoke with [facility] nurse Larena Glassman RN regarding patient's mobility issues . Confirmed patient received her manual W/C and is able to use this chair when needed to leave the home for MD appointments . Confirmed patient was able to make her f/u with Retina specialist last week as scheduled . Determined [facility] faxed the appropriate clinical notes to [place] for initiation of patient's purchase of a power W/C 10/11/19 Placed outbound call to [facility] [phone] . Spoke with Maudie Mercury from [employer] for a status update on patient's power W/C . Confirmed clinical notes have been received, determined an intake is needed to complete in order to proceed with the order . Call abruptly ended due to technical difficult on the pharmacy end, plan to attempt DME f/u in 1-2 days  Patient Self Care Activities:  . Self administers medications as prescribed . Attends all scheduled provider  appointments . Calls pharmacy for medication refills . Calls provider office for new concerns or questions  Please see past updates related to this goal by clicking on the "Past Updates" button in the selected goal      .  "to get the swelling down in my legs" (pt-stated)        CARE PLAN ENTRY (see longitudinal plan of care for additional care plan information)  Current Barriers:  [name] Knowledge Deficits related to evaluation and treatment of bilateral lymphedema  . Chronic Disease Management support and education needs related to Diabetes Mellitus with stage 3 Chronic Kidney disease, Parenchymal renal hypertension   Nurse Case Manager Clinical Goal(s):  [name] Over the next 30 days, patient will complete her new patient appointment with the [facility] for evaluation and treatment of lymphedema to her lower extremities  . Over the next 90 days, patient will work with the CCM team and PCP to address needs related to disease education and support to help improve Self Health management of Lymphedema  CCM RN CM Interventions:  09/09/19 call completed with patient  . Inter-disciplinary care team collaboration (see longitudinal plan of care) . Evaluation of current treatment plan related to right foot ulceration and bilateral lower leg lymphedema and patient's adherence to plan as established by provider . Determined patient is established with Dr. Cannon Kettle DPM for care of her right foot ulcer. Her last OV completed on 07/06/19 is noted with the following Assessment/Plan:  . Problem List Items Addressed This Visit  .  Visit Diagnoses  .    [name] Pre-ulcerative calluses    -  Primary .    [name] Pain due to onychomycosis of toenails of both feet     .    [name] PVD (peripheral vascular disease) ([place])     .    [name] Foot ulcer, right, limited to breakdown of skin ([place])     .    [name] Diabetic polyneuropathy associated with type 2 diabetes mellitus ([place])     .    . Lymphedema       . -Examined patient and  discussed the progression of the pinpoint ulceration and chronic edema . -Mechanically dedbrided ulcerated callus and callus skin bilateral using a sterile chisel blade, no opening to right foot. Betadine applied to protect skin on the right cover with a Band-Aid and advised patient if she would benefit from lymphedema management with wound center as well as then helping to care for the right foot ulceration to prevent it from getting worse.  . -Continue with offloading padding to shoes bilateral . -At no additional charge mechanically debrided nails using sterile nail nipper without incident  . -Patient to return to office in 6 weeks for wound/callus check or sooner if problems arise . Determined patient completed a f/u visit at the [facility] on 07/27/19 for evaluation and treatment of lymphadema . Currently patient is wearing compression stockings and elevating extremities but reports lymphedema has worsened; Discussed RN from [place] will make a home visit on 09/13/19 . Collaborated with PCP Dr. Glendale Chard regarding patient's request to be evaluated for mechanical DME such as an electric W/C and or power Scooter; Advised of patient reported worsening lymphedema; Advised RN nurse visit from [place] is scheduled for 09/13/19; Discussed having a [facility] referral placed for ongoing clinical support and advised PCP, sending a Remote [facility] referral will not delay or prevent services from Aging Grace[facility], Advised patient will need her PCP AWV rescheduled due to having previous visit cancelled for unknown reason    . Discussed plans with patient for ongoing care management follow up and provided patient with direct contact information for care management team 10/05/19 RN CM Collaboration with [facility] nurse Larena Glassman RN . Received and reviewed the following patient update from [place]  . Patient is scheduled with the lymphedema Clinic on  Thursday, September 23 _0 :30 pm . Determined [facility] will [facility] patient from PT due to f/u an outpatient clinic such as the lymphedema clinic . Determined patient's lymphedema is severe and this is preventing her from being able to lift her legs independently in order to be able to get into her bed and or elevate her extremities independently . Determined patient's granddaughter is staying with the patient, however the patient will not ask for help and often sleeps on the edge of her bed with her legs in a downward position . Discussed Larena Glassman RN has educated patient on the adverse effects this will have for the lymphedema  and may impair of prevent resolution of the lymphedema . Determined Larena Glassman RN will re-evaluate next steps following patient's f/u with the lymphedema clinic scheduled for next week . Determined the [facility] Mobility PT will work with the patient once weekly since she will be d[facility]c from [facility] . Discussed patient may need inpatient admission if she is unable to comply with the treatment recommendations provided by the lymphedema clinic  10/11/19 Inbound call from [place] . Received voice message from [place] advising Ms. Alejo completed her lymphedema new patient consultation with the following recommendations reviewed and noted . Assessment/Plan:  o ASSESSMENT The patient is a 74 y.o. with a medical diagnosis of lymphedema LE's. Due to lymphedema, the patient is at risk for infections and increased time for wound healing. Pt with left >right lower leg/foot swelling flare-up of chronic swelling x 5 months. Pt would benefit from skilled PT for getting/learning tools needed to reduce and manage swelling. Pt with 2 month history of falls and would benefit from HHPT for transfers/functional strengthening to reduce falls (or if not truly homebound, outpatient PT for this) Pt may also benefit from a compression pump if not making good  progress with reduction when using velcro inelastic compression garments x 1 month. Compression Recommendations: velcro inelastic garments Therapy Diagnosis:  1. Lymphedema  Problem List: Problem List Problem List: Impaired ambulation, Impaired transfer/functional mobility, Need for equipment assessment, Pain, Lymphedema  Other Rehabilitation Considerations: Obesity, Poor activity tolerance and Financial limitation / insurance limitations Response to Evaluation: The session was tolerated well, as evidenced by pt appreciative of info/education Rehabilitation Potential:  Motivation/Commitment to Therapy: Good Rehabilitation Potential: Good  Support Structure: Fair Family member willing to assist Goals: Time frame to achieve long term goals: 01/06/20 Goals Addressed  This Visit's Progress  . PT Goal 1 -  Pt/family are independent in applying compression inelastic velcro garments for LE's, correctly. Within 4 visits  . PT Goal 2 -  Pt verbalizes warning signs of infection and appropriate response. Within 4 visits.  . PT Goal 3 -  Patient in independent in daily Home exercise program for improving lymphatic circulation. Within 4 visits  PLAN Treatment Frequency, Duration, and Interventions: Plan Treatment Frequency and duration : 4 visits within 3 months (by 01/06/20) Time frame to achieve long term goals: 01/06/20 Recommend PT procedures: Manual Therapy, Orthotic Management and Training, Self Care / Home Management, Therapeutic Exercise Necessity: Required to return to Premorbid environment (or reside in new living environment)? no Required to reduce ADL or IADL assistance to Premorbid level? no Recommended Consults: Prosthetist / Orthotist- Raford Pitcher in [place] of Care: Patient participated in plan of care development today. The patient has been instructed to contact our clinic if any questions or problems should arise. 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 Self administers medications as prescribed . Attends all scheduled provider appointments . Calls pharmacy for medication refills . Calls provider office for new concerns or questions  Please see past updates related to this goal by clicking on the "Past Updates" button in the selected goal  Plan:   Telephone follow up appointment with care management team member scheduled for: 10/14/19  Barb Merino, RN, BSN, CCM Care Management Coordinator [place] [facility]  Direct Phone: [phone]

## 2019-10-12 ENCOUNTER — Other Ambulatory Visit: Payer: Self-pay | Admitting: *Deleted

## 2019-10-12 ENCOUNTER — Encounter: Payer: Self-pay | Admitting: *Deleted

## 2019-10-12 NOTE — Patient Instructions (Addendum)
Great job putting these strategies in place for your normal routine, Robbye!  Keep up the great work!  I am glad that you have the temporary ramp in place and that you are finding it helpful in getting in/ out of your home.  I am also glad that you are finding the exercises helpful and are continuing to work on asking for help when you need it, and avoiding rushing.  I will call you within the next month to either complete or schedule our next [facility]fully RN visit.   CLIENT/RN ACTION PLAN - FALL PREVENTION  Registered Nurse:  [name] RN Date: 9/28//2021  Client Name: Prisilla Kocsis Client ID: [id number]   Target Area:  FALL PREVENTION    Why Problem May Occur: Mobility issues due to chronic pain from arthritis, new diagnosis of lymphedema, decreased vision   Target Goal: "I don't want to have any more falls"   STRATEGIES Coping Strategies Ideas  Change Positions Slowly: lying to sitting, sitting to standing . Changing positions can make people lightheaded. . When getting up in the morning sit for a few minutes, before standing. . Continue using your walker . Continue working with Proofreader at your primary care office to obtain the equipment you need at home, through [employer]  . Continue to take your time with all activity: DON'T RUSH!!! . Wear good shoes with sturdy soles . Stand for a few minutes before walking and hold on to sturdy furniture or countertop.  .   Drink water . Dehydration can make people dizzy. . Ask your healthcare provider how much water you can drink. . Decrease caffeine, caffeine makes you urinate a lot, which can make you dehydrated. .   If you fall, tell someone . Tell a friend or family member even if you didn't get hurt. . Tell your healthcare provider if you fall.  They can help you figure out why. . Continue to wear your life-alert system at all times . Write any falls that you have down on your calendar and add what you  think caused you to fall  Get your vision and hearing checked . Poor vision and hearing loss can make people fall. . Glaucoma and diabetes can cause poor vision. . Continue attending your routine eye provider appointments  Other .    Prevention Ideas  Review your Medicines:  Your use of blood pressure medication and pain medication could make you feel light-headed or dizzy; if you experience this, let your doctor know right away . Many medicines can make you dizzy or sleepy and increase your risk of falling. . Your [facility] Nurse will look at your medications and see if you are taking any that might cause falling. . Use your pill box to keep your medications organized; fill it up weekly, using the method we discussed  Activity and Exercise- try to start doing some of the Aging Gracefully exercises that we went over during our visit -- start gradually and slowly build up the amount you do -- don't ever over-do exercise or exercise so much that it causes you pain -- always maintain good posture with your body mechanics . Aging Gracefully exercises . Walking (inside or outside) . Dancing . Gardening . Housework:  cooking, Education officer, environmental, Pharmacologist . Exercise while watching TV . Swimming or water aerobics.   Strengthen Bones . Exercise makes your bones stronger. . Vitamin D and Calcium make your bones stronger.  Ask your Healthcare provider if  you should be taking them. . Your body makes vitamin D from the sun.  Sit in the sun for 10 minutes every day (without sunscreen). .   Control your urine  . Prevent constipation . Cut back on caffeinated drinks. . Don't wait to urinate. . If diabetic, control your blood sugar. . Ask your [facility]fully Nurse and Healthcare Provider  about urine control. .   Control your blood sugar (if you are diabetic) . High blood sugar can cause frequent urination, poor vision, and numbness in your feet, which can make you fall.   . Low blood sugar can cause  confusion and dizziness. .   Other coping strategies 1. 2. 3. 4. 5.   PRACTICE It is important to practice the strategies so we can determine if they will be effective in helping to reach the goal.    Follow these specific recommendations:  If you come up with any additional strategies that keep you from falling, write them down so we can talk about them the next time we talk   10/11/19: [place] job putting these strategies in place for your normal routine, Asmara!  Keep up the great work!  I am glad that you have the temporary ramp in place and that you are finding it helpful in getting in/ out of your home.  I am also glad that you are finding the exercises helpful and are continuing to work on asking for help when you need it, and avoiding rushing.  I will call you within the next month to either complete or schedule our next Aging Gracefully RN visit.   If strategy does not work the first time, try it again.     We may make some changes over the next few sessions.      Caryl Pina, RN, BSN, [employer] [facility] Care Management  [phone]

## 2019-10-12 NOTE — Patient Outreach (Signed)
Aging Physiological scientist Visit # 2  10/12/2019  DELSY ETZKORN 03/29/1945 [id number]  Visit:   Aging Gracefully RN Visit # 2- by phone  Start Time:   13:30  End Time:   13:55 Total Minutes:   25 minutes  Universal RN Interventions: Calendar Distribution: Yes (reviewed with client today) Exercise Review: Yes (reviewed with client today) Medications: Yes (reviewed with client today) Medication Changes: No (reviewed with client today) PCP Advocacy/Support: Yes (reviewed with client today) Fall Prevention: Yes (reviewed with client today)  Healthcare Provider Communication: Did Surveyor, mining With [employer] Provider?: No  Clinician View of Client Situation: Clinician View Of Client Situation: Illianna appears to be making great progress in meeting her previously established Aging Gracefully goals around fall prevention.  She continues working with her PCP office team to obtain her desired motorized wheelchair, and reports having learned better how to avoid rushing and to ask for help from others when she needs it.  Laiah is hopeful that the home modification team will resume work soon.   Client's View of His/Her Situation: Client View Of His/Her Situation: "I think I am holding my own; I have really stopped rushing and getting ahead of mysefl-- I am realizing that there are things I know I can't do, that I used to be able to, so I am practicing getting better at asking for help when I need it.  They have put up the temporary ramp and will be replacing it with a permanent one, and this is very helpful for me getting in/ out of my home.  My doctor's office is working on helping me get a motorized wheelchair.  I have not had any new or recent falls, and I am doing the exercises you showed me, and am not doing the ones that might cause me to fall.  I am loving using the calendar book and the pill box; they both help me stay much more organized than I was."  Medication Assessment:  Confirmed no changes to medications; using pill box provided at time of RN Visit # 1   OT Update: Will update Aging Gracefully team on successful completion of RN Visit # 2 by phone today  Session Summary: Pleasant Aging Gracefully RN Visit # 2 by phone; client appears to be on track to meeting her previously established Aging Gracefully RN goals around fall prevention.  Brainstorming for additional strategies was completed, however, client reports that she has not developed any additional strategies at this time, as there have been no recent home modifications nor Aging [facility]fully OT visits completed since our last outreach/ visit.  She confirms that she continues putting previously identified strategies into her routine, and confirms that she believes these are all continuing to help her.  Discussed with client that I would call her within the next 4 weeks to either schedule next RN Visit # 3 by phone or in-person, depending on progress made by home modification and OT teams; she is agreeable to this.  Caryl Pina, RN, BSN, [employer] [facility] Care Management  [phone]

## 2019-10-14 ENCOUNTER — Ambulatory Visit: Payer: Self-pay

## 2019-10-14 ENCOUNTER — Other Ambulatory Visit: Payer: Self-pay

## 2019-10-14 ENCOUNTER — Telehealth: Payer: Medicare Other

## 2019-10-14 DIAGNOSIS — I129 Hypertensive chronic kidney disease with stage 1 through stage 4 chronic kidney disease, or unspecified chronic kidney disease: Secondary | ICD-10-CM

## 2019-10-14 DIAGNOSIS — E1122 Type 2 diabetes mellitus with diabetic chronic kidney disease: Secondary | ICD-10-CM

## 2019-10-14 DIAGNOSIS — N183 Chronic kidney disease, stage 3 unspecified: Secondary | ICD-10-CM

## 2019-10-15 NOTE — Chronic Care Management (AMB) (Addendum)
Chronic Care Management   Follow Up Note   10/14/19 Name: Mary [name] MRN: [id number] DOB: 06-07-1945  Referred by: Mary Chard, MD Reason for referral : Chronic Care Management (CCM RN FU Call )   Mary [name] is a 74 y.o. year old female who is a primary care patient of Mary Chard, MD. The CCM team was consulted for assistance with chronic disease management and care coordination needs.    Review of patient status, including review of consultants reports, relevant laboratory and other test results, and collaboration with appropriate care team members and the patient's provider was performed as part of comprehensive patient evaluation and provision of chronic care management services.    SDOH (Social Determinants of Health) assessments performed: No See Care Plan activities for detailed interventions related to SDOH)   CCM RN CM Case Collaboration with [facility] nurse Mary Glassman RN.     Outpatient Encounter Medications as of 10/14/2019  Medication Sig Note  . ACCU-CHEK AVIVA PLUS test strip Use as directed to check blood sugars 1 time per day dx: e11.22   . aspirin EC 81 MG tablet Take 81 mg by mouth daily.   Mary [name] Comfort Lancets 28G MISC    . Blood Glucose Monitoring Suppl (ACCU-CHEK AVIVA PLUS) w/Device KIT Use as directed to check blood sugars 2 times per day dx: e11.65   . cetirizine (ZYRTEC) 10 MG tablet TAKE 1 TABLET BY MOUTH EVERY DAY   . dorzolamide-timolol (COSOPT) 22.3-6.8 MG/ML ophthalmic solution Place 1 drop into the right eye 2 (two) times daily.   . fluorometholone (FML) 0.1 % ophthalmic suspension Place 1 drop into the right eye 3 (three) times daily. (Patient not taking: Reported on 09/02/2019)   . fluticasone (FLONASE) 50 MCG/ACT nasal spray SPRAY 1 SPRAY INTO EACH NOSTRIL EVERY DAY   . furosemide (LASIX) 40 MG tablet Take 1 tablet (40 mg total) by mouth daily.   [name] HYDROcodone-acetaminophen (NORCO) 10-325 MG tablet Take 1 tablet by mouth 5 (five)  times daily as needed.   [name] KLOR-CON M20 20 MEQ tablet Take 20 mEq by mouth daily. with food (Patient not taking: Reported on 09/02/2019) 02/23/2018: Patient instructed to hold this medication due to abnormal Potassium level of 6.2 on 02/18/18, per MD, Dr. Glendale [name]. Patient was given verbal education related to complications from hypo and hyperkalemia and importance of following MD recommendations. Patient instructed to resume this medication when directed by PCP. Patient verbalizes understanding.   [name] levothyroxine (SYNTHROID) 125 MCG tablet Take 125 mcg by mouth daily before breakfast.   . loratadine (CLARITIN) 10 MG tablet Take 10 mg by mouth daily. (Patient not taking: Reported on 09/02/2019)   . magnesium oxide (MAG-OX) 400 MG tablet TAKE 1 TABLET BY MOUTH EVERY EVENING   . metoprolol tartrate (LOPRESSOR) 50 MG tablet Take 50 mg by mouth 2 (two) times daily.    [name] omeprazole (PRILOSEC) 20 MG capsule TAKE 1 CAPSULE BY MOUTH  DAILY   . pravastatin (PRAVACHOL) 40 MG tablet TAKE 1 TABLET BY MOUTH EVERY DAY   . prednisoLONE acetate (PRED FORTE) 1 % ophthalmic suspension Place 1 drop into the right eye 3 (three) times daily. (Patient not taking: Reported on 09/02/2019)   . predniSONE (DELTASONE) 10 MG tablet Take by mouth. (Patient not taking: Reported on 09/02/2019)   . telmisartan (MICARDIS) 80 MG tablet TAKE 1 TABLET BY MOUTH  DAILY   . triamcinolone cream (KENALOG) 0.1 % APPLY TO AFFECTED AREA TWICE A DAY  No facility-administered encounter medications on file as of 10/14/2019.     Objective:  Lab Results  Component Value Date   HGBA1C 5.0 04/06/2019   HGBA1C 5.1 06/24/2018   HGBA1C 4.9 02/18/2018   Lab Results  Component Value Date   MICROALBUR 30 11/18/2017   LDLCALC 65 11/16/2018   CREATININE 1.12 (H) 08/08/2019   BP Readings from Last 3 Encounters:  08/08/19 (!) 140/80  06/28/19 (!) 166/98  06/16/19 140/86    Goals Addressed      Patient Stated   .  "I would like to new power  Wheelchair to get around better outside of my home" (pt-stated)        [place] (see longitudinal plan of care for additional care plan information)  Current Barriers:  [name] Knowledge Deficits related to determination for best DME to help improve mobility  . Chronic Disease Management support and education needs related to Diabetes Mellitus with stage 3 Chronic Kidney disease, Parenchymal renal hypertension  . Impaired Physical Mobility   Nurse Case Manager Clinical Goal(s):  [name] Over the next 90 days, patient will verbalize understanding of plan for evaluation and determination for best DME to help improve patient's mobility, manual W/C, verses electric W/C, verses power Scooter   CCM RN CM Interventions:  10/05/19 Collaborative call with [facility] nurse . Inter-disciplinary care team collaboration (see longitudinal plan of care) . Evaluation of current treatment plan related to Impaired Physical Mobility and DME needs and patient's adherence to plan as established by provider . Spoke with Remote [facility] nurse Mary Glassman RN regarding patient's mobility issues . Confirmed patient received her manual W/C and is able to use this chair when needed to leave the home for MD appointments . Confirmed patient was able to make her f/u with Retina specialist last week as scheduled . Determined [facility] faxed the appropriate clinical notes to [place] for initiation of patient's purchase of a power W/C 10/11/19 Placed outbound call to [facility] [phone] . Spoke with Mary [name] from [employer] for a status update on patient's power W/C . Confirmed clinical notes have been received, determined an intake is needed to complete in order to proceed with the order . Call abruptly ended due to technical difficult on the pharmacy end, plan to attempt DME f/u in 1-2 days 10/13/19 Collaboration with [facility] nurse, Mary Glassman RN . Discussed Mary [name] will need a PT face to face  evaluation in order to move forward with the purchase of her power W/C . Determined Mary Glassman RN will coordinate in home PT to resume services via [place] and will ensure a DME evaluation will be completed . Discussed that Mary Glassman RN will assist patient with obtaining the compression stockings prescribed the lymphedema clinic . Discussed Ms. Igo may benefit from wearing lymphedema pumps following the use of compression stockings, prescribed to wear x 1 month . Discussed the patient will not return to the lymphedema clinic as follow up was not recommended . Determined Ms. Mellott continues to struggle with having difficulty lifting her legs independently due to the severity of her lymphedema, this is making it difficult for her to lift her legs to her bed when needing to ly down . Determined the [facility] PTA is working with the patient once weekly on how to safely get her lower extremities raised to height/bed level independently  Patient Self Care Activities:  . Self administers medications as prescribed . Attends all scheduled provider appointments . Calls  pharmacy for medication refills . Calls provider office for new concerns or questions  Please see past updates related to this goal by clicking on the "Past Updates" button in the selected goal        Plan:   Telephone follow up appointment with care management team member scheduled for: 11/15/19  Barb Merino, RN, BSN, CCM Care Management Coordinator [place] [facility]  Direct Phone: [phone]

## 2019-10-18 DIAGNOSIS — H401111 Primary open-angle glaucoma, right eye, mild stage: Secondary | ICD-10-CM | POA: Diagnosis not present

## 2019-10-18 LAB — HM DIABETES EYE EXAM

## 2019-10-19 ENCOUNTER — Ambulatory Visit: Payer: Medicare Other

## 2019-10-19 ENCOUNTER — Telehealth: Payer: Self-pay

## 2019-10-19 ENCOUNTER — Other Ambulatory Visit: Payer: Self-pay

## 2019-10-19 ENCOUNTER — Telehealth: Payer: Medicare Other

## 2019-10-19 ENCOUNTER — Encounter: Payer: Self-pay | Admitting: Internal Medicine

## 2019-10-19 ENCOUNTER — Ambulatory Visit: Payer: Self-pay

## 2019-10-19 DIAGNOSIS — N183 Chronic kidney disease, stage 3 unspecified: Secondary | ICD-10-CM

## 2019-10-19 DIAGNOSIS — E1122 Type 2 diabetes mellitus with diabetic chronic kidney disease: Secondary | ICD-10-CM

## 2019-10-19 DIAGNOSIS — I129 Hypertensive chronic kidney disease with stage 1 through stage 4 chronic kidney disease, or unspecified chronic kidney disease: Secondary | ICD-10-CM

## 2019-10-19 MED ORDER — ACCU-CHEK AVIVA PLUS W/DEVICE KIT: PACK | 1 refills | Status: DC

## 2019-10-19 MED ORDER — ONETOUCH VERIO FLEX SYSTEM W/DEVICE KIT: PACK | 1 refills | Status: DC

## 2019-10-19 MED ORDER — ONETOUCH VERIO VI STRP: ORAL_STRIP | 3 refills | Status: DC

## 2019-10-19 MED ORDER — ONETOUCH DELICA LANCETS 33G MISC: 3 refills | Status: DC

## 2019-10-19 NOTE — Telephone Encounter (Signed)
The pt was notified that I would let her know when her letter for jury duty is ready for pickup.

## 2019-10-19 NOTE — Telephone Encounter (Cosign Needed)
  Chronic Care Management   Outreach Note  10/19/2019 Name: Mary [name] MRN: [id number] DOB: [birth year]  Referred by: Dorothyann Peng, MD Reason for referral : Chronic Care Management (Inbound call from patient )   Inbound call from patient. Voice message received from patient requesting a return call to assist with transportation needs. Collaborated with embedded BSW Bevelyn Ngo requesting outreach to patient for assistance. The patient was referred to the case management team for assistance with care management and care coordination.   Follow Up Plan: Telephone follow up appointment with care management team member scheduled for: 10/19/19  Delsa Sale, RN, BSN, CCM Care Management Coordinator [facility] Care Management/[facility]  Direct Phone: [phone]

## 2019-10-19 NOTE — Patient Instructions (Signed)
Social Worker Visit Information  Goals we discussed today:  Goals Addressed            This Visit's Progress    Collaborate with RN Care Manager to perform appropriate assessments to assist with care coordination needs       CARE PLAN ENTRY (see longitudinal plan of care for additional care plan information)  Current Barriers:   Transportation  Limited access to caregiver  Inability to perform ADL's independently  Inability to perform IADL's independently  Chronic conditions including DM II, CKD III, and parenchymal renal HTN which increase patient risk for hospitalization  Social Work Clinical Goal(s):   Over the next 60 days the patient will work with SW to identify transportation resources  Over the next 120 days the patient will work with SW to address ongoing care coordination needs  CCM SW Interventions: Completed 10/19/19  Inter-disciplinary care team collaboration (see longitudinal plan of care)  Collaboration with Medical illustrator who indicated patient interest in transportation resources  Successful outbound call placed to the patient to assist with resource needs o Patient reports she has generally used health plan transportation services but she has run out of rides for the remainder of the plan year  Discussed opportunity to apply for [employer] (SCAT) o Patient reports she does not particularly like SCAT but is willing to try it again  Determined the patient has used this resource in the past but is unsure if she is still considered an approved Landscape architect with Lorretta Harp with [employer] Access to determine patient eligibility  Advised the patient SW will assist with a new application as needed pending response from Mrs Suzan Nailer  Scheduled follow up over the next week  Patient Self Care Activities:   Patient verbalizes understanding of plan to work with SW to address care coordination needs  Self administers medications as  prescribed  Calls pharmacy for medication refills  Calls provider office for new concerns or questions  Unable to perform ADLs independently  Unable to perform IADLs independently  Initial goal documentation         Follow Up Plan: SW will follow up with patient by phone over the next week.   Bevelyn Ngo, BSW, CDP Social Worker, Certified Dementia Practitioner [facility] Care Management [phone]

## 2019-10-19 NOTE — Telephone Encounter (Signed)
  Chronic Care Management   Outreach Note  10/19/2019 Name: Mary [name] MRN: [id number] DOB: [birth year]  Referred by: Dorothyann Peng, MD Reason for referral : Care Coordination   SW placed an unsuccessful outbound call to the patients daughter to assess ongoing care coordination needs. SW unable to leave a voice message due to the voice mailbox being full.  Follow Up Plan: The care management team will reach out to the patient again over the next 7 days.   Bevelyn Ngo, BSW, CDP Social Worker, Certified Dementia Practitioner [facility] Care Management [phone]

## 2019-10-19 NOTE — Chronic Care Management (AMB) (Signed)
Chronic Care Management    Social Work Follow Up Note  10/19/2019 Name: Mary [name] MRN: [id number] DOB: 10-Jun-1945  Mary [name] is a 74 y.o. year old female who is a primary care patient of Mary Chard, MD. The CCM team was consulted for assistance with care coordination.   Review of patient status, including review of consultants reports, other relevant assessments, and collaboration with appropriate care team members and the patient's provider was performed as part of comprehensive patient evaluation and provision of chronic care management services.    SDOH (Social Determinants of Health) assessments performed: No    Outpatient Encounter Medications as of 10/19/2019  Medication Sig Note  . Assure Comfort Lancets 28G MISC    . Blood Glucose Monitoring Suppl (ONETOUCH VERIO FLEX SYSTEM) w/Device KIT Use as directed to check blood sugars 2 times per day dx: e11.65   . cetirizine (ZYRTEC) 10 MG tablet TAKE 1 TABLET BY MOUTH EVERY DAY   . dorzolamide-timolol (COSOPT) 22.3-6.8 MG/ML ophthalmic solution Place 1 drop into the right eye 2 (two) times daily.   . fluorometholone (FML) 0.1 % ophthalmic suspension Place 1 drop into the right eye 3 (three) times daily. (Patient not taking: Reported on 09/02/2019)   . fluticasone (FLONASE) 50 MCG/ACT nasal spray SPRAY 1 SPRAY INTO EACH NOSTRIL EVERY DAY   . furosemide (LASIX) 40 MG tablet Take 1 tablet (40 mg total) by mouth daily.   [name] glucose blood (ONETOUCH VERIO) test strip Use as directed to check blood sugars 2 times per day dx: e11.65   . HYDROcodone-acetaminophen (NORCO) 10-325 MG tablet Take 1 tablet by mouth 5 (five) times daily as needed.   [name] KLOR-CON M20 20 MEQ tablet Take 20 mEq by mouth daily. with food (Patient not taking: Reported on 09/02/2019) 02/23/2018: Patient instructed to hold this medication due to abnormal Potassium level of 6.2 on 02/18/18, per MD, Dr. Glendale [name]. Patient was given verbal education related to  complications from hypo and hyperkalemia and importance of following MD recommendations. Patient instructed to resume this medication when directed by PCP. Patient verbalizes understanding.   [name] levothyroxine (SYNTHROID) 125 MCG tablet Take 125 mcg by mouth daily before breakfast.   . loratadine (CLARITIN) 10 MG tablet Take 10 mg by mouth daily. (Patient not taking: Reported on 09/02/2019)   . magnesium oxide (MAG-OX) 400 MG tablet TAKE 1 TABLET BY MOUTH EVERY EVENING   . metoprolol tartrate (LOPRESSOR) 50 MG tablet Take 50 mg by mouth 2 (two) times daily.    [name] omeprazole (PRILOSEC) 20 MG capsule TAKE 1 CAPSULE BY MOUTH  DAILY   . OneTouch Delica Lancets 17B MISC Use as directed to check blood sugars 2 times per day dx: e11.65   . pravastatin (PRAVACHOL) 40 MG tablet TAKE 1 TABLET BY MOUTH EVERY DAY   . prednisoLONE acetate (PRED FORTE) 1 % ophthalmic suspension Place 1 drop into the right eye 3 (three) times daily. (Patient not taking: Reported on 09/02/2019)   . predniSONE (DELTASONE) 10 MG tablet Take by mouth. (Patient not taking: Reported on 09/02/2019)   . telmisartan (MICARDIS) 80 MG tablet TAKE 1 TABLET BY MOUTH  DAILY   . triamcinolone cream (KENALOG) 0.1 % APPLY TO AFFECTED AREA TWICE A DAY    No facility-administered encounter medications on file as of 10/19/2019.     Goals Addressed            This Visit's Progress   . Collaborate with RN Care Manager to perform  appropriate assessments to assist with care coordination needs       CARE PLAN ENTRY (see longitudinal plan of care for additional care plan information)  Current Barriers:  . Transportation . Limited access to caregiver . Inability to perform ADL's independently . Inability to perform IADL's independently . Chronic conditions including DM II, CKD III, and parenchymal renal HTN which increase patient risk for hospitalization  Social Work Clinical Goal(s):  [name] Over the next 60 days the patient will work with SW to  identify transportation resources . Over the next 120 days the patient will work with SW to address ongoing care coordination needs  CCM SW Interventions: Completed 10/19/19 . Inter-disciplinary care team collaboration (see longitudinal plan of care) . Collaboration with RN Care Manager who indicated patient interest in transportation resources . Successful outbound call placed to the patient to assist with resource needs o Patient reports she has generally used health plan transportation services but she has run out of rides for the remainder of the plan year . Discussed opportunity to apply for [facility] Access (SCAT) o Patient reports she does not particularly like SCAT but is willing to try it again . Determined the patient has used this resource in the past but is unsure if she is still considered an approved rider . Collaboration with Mary [name] with [facility] Access to determine patient eligibility . Advised the patient SW will assist with a new application as needed pending response from Mary [name] . Scheduled follow up over the next week  Patient Self Care Activities:  . Patient verbalizes understanding of plan to work with SW to address care coordination needs . Self administers medications as prescribed . Calls pharmacy for medication refills . Calls provider office for new concerns or questions . Unable to perform ADLs independently . Unable to perform IADLs independently  Initial goal documentation         Follow Up Plan: SW will follow up with patient by phone over the next week.   Mary [name], BSW, CDP Social Worker, Certified Dementia Practitioner [place] / [place] [facility] [phone]  Total time spent performing care coordination and/or care management activities with the patient by phone or face to face = 16 minutes.

## 2019-10-20 ENCOUNTER — Ambulatory Visit: Payer: Medicare Other

## 2019-10-20 DIAGNOSIS — E1122 Type 2 diabetes mellitus with diabetic chronic kidney disease: Secondary | ICD-10-CM

## 2019-10-20 DIAGNOSIS — I129 Hypertensive chronic kidney disease with stage 1 through stage 4 chronic kidney disease, or unspecified chronic kidney disease: Secondary | ICD-10-CM

## 2019-10-20 DIAGNOSIS — N183 Chronic kidney disease, stage 3 unspecified: Secondary | ICD-10-CM

## 2019-10-20 NOTE — Chronic Care Management (AMB) (Signed)
Chronic Care Management    Social Work Follow Up Note  10/20/2019 Name: Mary [name] MRN: [id number] DOB: 1945/02/28  Mary [name] is a 74 y.o. year old female who is a primary care patient of Glendale Chard, MD. The CCM team was consulted for assistance with care coordination.   Review of patient status, including review of consultants reports, other relevant assessments, and collaboration with appropriate care team members and the patient's provider was performed as part of comprehensive patient evaluation and provision of chronic care management services.    SDOH (Social Determinants of Health) assessments performed: No    Outpatient Encounter Medications as of 10/20/2019  Medication Sig Note  . Assure Comfort Lancets 28G MISC    . Blood Glucose Monitoring Suppl (ONETOUCH VERIO FLEX SYSTEM) w/Device KIT Use as directed to check blood sugars 2 times per day dx: e11.65   . cetirizine (ZYRTEC) 10 MG tablet TAKE 1 TABLET BY MOUTH EVERY DAY   . dorzolamide-timolol (COSOPT) 22.3-6.8 MG/ML ophthalmic solution Place 1 drop into the right eye 2 (two) times daily.   . fluorometholone (FML) 0.1 % ophthalmic suspension Place 1 drop into the right eye 3 (three) times daily. (Patient not taking: Reported on 09/02/2019)   . fluticasone (FLONASE) 50 MCG/ACT nasal spray SPRAY 1 SPRAY INTO EACH NOSTRIL EVERY DAY   . furosemide (LASIX) 40 MG tablet Take 1 tablet (40 mg total) by mouth daily.   [name] glucose blood (ONETOUCH VERIO) test strip Use as directed to check blood sugars 2 times per day dx: e11.65   . HYDROcodone-acetaminophen (NORCO) 10-325 MG tablet Take 1 tablet by mouth 5 (five) times daily as needed.   [name] KLOR-CON M20 20 MEQ tablet Take 20 mEq by mouth daily. with food (Patient not taking: Reported on 09/02/2019) 02/23/2018: Patient instructed to hold this medication due to abnormal Potassium level of 6.2 on 02/18/18, per MD, Dr. Glendale Chard. Patient was given verbal education related to  complications from hypo and hyperkalemia and importance of following MD recommendations. Patient instructed to resume this medication when directed by PCP. Patient verbalizes understanding.   [name] levothyroxine (SYNTHROID) 125 MCG tablet Take 125 mcg by mouth daily before breakfast.   . loratadine (CLARITIN) 10 MG tablet Take 10 mg by mouth daily. (Patient not taking: Reported on 09/02/2019)   . magnesium oxide (MAG-OX) 400 MG tablet TAKE 1 TABLET BY MOUTH EVERY EVENING   . metoprolol tartrate (LOPRESSOR) 50 MG tablet Take 50 mg by mouth 2 (two) times daily.    [name] omeprazole (PRILOSEC) 20 MG capsule TAKE 1 CAPSULE BY MOUTH  DAILY   . OneTouch Delica Lancets 00T MISC Use as directed to check blood sugars 2 times per day dx: e11.65   . pravastatin (PRAVACHOL) 40 MG tablet TAKE 1 TABLET BY MOUTH EVERY DAY   . prednisoLONE acetate (PRED FORTE) 1 % ophthalmic suspension Place 1 drop into the right eye 3 (three) times daily. (Patient not taking: Reported on 09/02/2019)   . predniSONE (DELTASONE) 10 MG tablet Take by mouth. (Patient not taking: Reported on 09/02/2019)   . telmisartan (MICARDIS) 80 MG tablet TAKE 1 TABLET BY MOUTH  DAILY   . triamcinolone cream (KENALOG) 0.1 % APPLY TO AFFECTED AREA TWICE A DAY    No facility-administered encounter medications on file as of 10/20/2019.     Goals Addressed            This Visit's Progress   . Collaborate with RN Care Manager to perform  appropriate assessments to assist with care coordination needs   On track    [place] (see longitudinal plan of care for additional care plan information)  Current Barriers:  . Transportation . Limited access to caregiver . Inability to perform ADL's independently . Inability to perform IADL's independently . Chronic conditions including DM II, CKD III, and parenchymal renal HTN which increase patient risk for hospitalization  Social Work Clinical Goal(s):  [name] Over the next 60 days the patient will work with SW  to identify transportation resources . Over the next 120 days the patient will work with SW to address ongoing care coordination needs  CCM SW Interventions: Completed 10/20/19 . Collaboration with [name] with [employer] (SCAT) who reports the patients eligibility expired in 2019 . Successful completion of Part A of SCAT application . Collaboration with Dr Baird Cancer to request Part B of SCAT application be completed on the patients behalf . SW to continue to follow  Completed 10/19/19 . Inter-disciplinary care team collaboration (see longitudinal plan of care) . Collaboration with RN Care Manager who indicated patient interest in transportation resources . Successful outbound call placed to the patient to assist with resource needs o Patient reports she has generally used health plan transportation services but she has run out of rides for the remainder of the plan year . Discussed opportunity to apply for [facility] Access (SCAT) o Patient reports she does not particularly like SCAT but is willing to try it again . Determined the patient has used this resource in the past but is unsure if she is still considered an approved rider . Collaboration with [name] with [facility] Access to determine patient eligibility . Advised the patient SW will assist with a new application as needed pending response from [name] . Scheduled follow up over the next week  Patient Self Care Activities:  . Patient verbalizes understanding of plan to work with SW to address care coordination needs . Self administers medications as prescribed . Calls pharmacy for medication refills . Calls provider office for new concerns or questions . Unable to perform ADLs independently . Unable to perform IADLs independently  Please see past updates related to this goal by clicking on the "Past Updates" button in the selected goal          Follow Up Plan: SW will follow up with patient by phone  over the next two weeks.   [name], BSW, CDP Social Worker, Certified Dementia Practitioner [place] / [place] [facility] [phone]  Total time spent performing care coordination and/or care management activities with the patient by phone or face to face = 15 minutes.

## 2019-10-20 NOTE — Chronic Care Management (AMB) (Signed)
Chronic Care Management   Follow Up Note   10/20/2019 Name: BRENDA SAMANO MRN: [id number] DOB: 08-18-1945  Referred by: Glendale Chard, MD Reason for referral : Chronic Care Management (RN Case Collaboration )   DELYNN OLVERA is a 74 y.o. year old female who is a primary care patient of Glendale Chard, MD. The CCM team was consulted for assistance with chronic disease management and care coordination needs.    Review of patient status, including review of consultants reports, relevant laboratory and other test results, and collaboration with appropriate care team members and the patient's provider was performed as part of comprehensive patient evaluation and provision of chronic care management services.    SDOH (Social Determinants of Health) assessments performed: No  See Care Plan activities for detailed interventions related to [place])   CCM RN CM telephone collaboration with Remote Health nurse, Larena Glassman RN regarding patient's lymphedema progress.    Outpatient Encounter Medications as of 10/19/2019  Medication Sig Note  . Assure Comfort Lancets 28G MISC    . Blood Glucose Monitoring Suppl (ONETOUCH VERIO FLEX SYSTEM) w/Device KIT Use as directed to check blood sugars 2 times per day dx: e11.65   . cetirizine (ZYRTEC) 10 MG tablet TAKE 1 TABLET BY MOUTH EVERY DAY   . dorzolamide-timolol (COSOPT) 22.3-6.8 MG/ML ophthalmic solution Place 1 drop into the right eye 2 (two) times daily.   . fluorometholone (FML) 0.1 % ophthalmic suspension Place 1 drop into the right eye 3 (three) times daily. (Patient not taking: Reported on 09/02/2019)   . fluticasone (FLONASE) 50 MCG/ACT nasal spray SPRAY 1 SPRAY INTO EACH NOSTRIL EVERY DAY   . furosemide (LASIX) 40 MG tablet Take 1 tablet (40 mg total) by mouth daily.   [name] glucose blood (ONETOUCH VERIO) test strip Use as directed to check blood sugars 2 times per day dx: e11.65   . HYDROcodone-acetaminophen (NORCO) 10-325 MG tablet Take 1 tablet by  mouth 5 (five) times daily as needed.   [name] KLOR-CON M20 20 MEQ tablet Take 20 mEq by mouth daily. with food (Patient not taking: Reported on 09/02/2019) 02/23/2018: Patient instructed to hold this medication due to abnormal Potassium level of 6.2 on 02/18/18, per MD, Dr. Glendale Chard. Patient was given verbal education related to complications from hypo and hyperkalemia and importance of following MD recommendations. Patient instructed to resume this medication when directed by PCP. Patient verbalizes understanding.   [name] levothyroxine (SYNTHROID) 125 MCG tablet Take 125 mcg by mouth daily before breakfast.   . loratadine (CLARITIN) 10 MG tablet Take 10 mg by mouth daily. (Patient not taking: Reported on 09/02/2019)   . magnesium oxide (MAG-OX) 400 MG tablet TAKE 1 TABLET BY MOUTH EVERY EVENING   . metoprolol tartrate (LOPRESSOR) 50 MG tablet Take 50 mg by mouth 2 (two) times daily.    [name] omeprazole (PRILOSEC) 20 MG capsule TAKE 1 CAPSULE BY MOUTH  DAILY   . OneTouch Delica Lancets 91P MISC Use as directed to check blood sugars 2 times per day dx: e11.65   . pravastatin (PRAVACHOL) 40 MG tablet TAKE 1 TABLET BY MOUTH EVERY DAY   . prednisoLONE acetate (PRED FORTE) 1 % ophthalmic suspension Place 1 drop into the right eye 3 (three) times daily. (Patient not taking: Reported on 09/02/2019)   . predniSONE (DELTASONE) 10 MG tablet Take by mouth. (Patient not taking: Reported on 09/02/2019)   . telmisartan (MICARDIS) 80 MG tablet TAKE 1 TABLET BY MOUTH  DAILY   . triamcinolone  cream (KENALOG) 0.1 % APPLY TO AFFECTED AREA TWICE A DAY    No facility-administered encounter medications on file as of 10/19/2019.     Objective:   Goals Addressed      Patient Stated   .  "to get the swelling down in my legs" (pt-stated)        CARE PLAN ENTRY (see longitudinal plan of care for additional care plan information)  Current Barriers:  [name] Knowledge Deficits related to evaluation and treatment of bilateral lymphedema    . Chronic Disease Management support and education needs related to Diabetes Mellitus with stage 3 Chronic Kidney disease, Parenchymal renal hypertension   Nurse Case Manager Clinical Goal(s):  [name] Over the next 30 days, patient will complete her new patient appointment with the [facility] for evaluation and treatment of lymphedema to her lower extremities  . Over the next 90 days, patient will work with the CCM team and PCP to address needs related to disease education and support to help improve Self Health management of Lymphedema  CCM RN CM Interventions:  09/09/19 call completed with patient  . Inter-disciplinary care team collaboration (see longitudinal plan of care) . Evaluation of current treatment plan related to right foot ulceration and bilateral lower leg lymphedema and patient's adherence to plan as established by provider . Determined patient is established with Dr. Cannon Kettle DPM for care of her right foot ulcer. Her last OV completed on 07/06/19 is noted with the following Assessment/Plan:  . Problem List Items Addressed This Visit  .  Visit Diagnoses  .    [name] Pre-ulcerative calluses    -  Primary .    [name] Pain due to onychomycosis of toenails of both feet     .    [name] PVD (peripheral vascular disease) ([place])     .    [name] Foot ulcer, right, limited to breakdown of skin ([place])     .    [name] Diabetic polyneuropathy associated with type 2 diabetes mellitus ([place])     .    . Lymphedema       . -Examined patient and discussed the progression of the pinpoint ulceration and chronic edema . -Mechanically dedbrided ulcerated callus and callus skin bilateral using a sterile chisel blade, no opening to right foot. Betadine applied to protect skin on the right cover with a Band-Aid and advised patient if she would benefit from lymphedema management with wound center as well as then helping to care for the right foot ulceration to prevent it from getting worse.  . -Continue with offloading  padding to shoes bilateral . -At no additional charge mechanically debrided nails using sterile nail nipper without incident  . -Patient to return to office in 6 weeks for wound/callus check or sooner if problems arise . Determined patient completed a f/u visit at the [facility] on 07/27/19 for evaluation and treatment of lymphadema . Currently patient is wearing compression stockings and elevating extremities but reports lymphedema has worsened; Discussed RN from [place] will make a home visit on 09/13/19 . Collaborated with PCP Dr. Glendale Chard regarding patient's request to be evaluated for mechanical DME such as an electric W/C and or power Scooter; Advised of patient reported worsening lymphedema; Advised RN nurse visit from [place] is scheduled for 09/13/19; Discussed having a Remote Health referral placed for ongoing clinical support and advised PCP, sending a Remote Health referral will not delay or prevent services from [facility],  Advised patient will need her PCP AWV rescheduled due to having previous visit cancelled for unknown reason    . Discussed plans with patient for ongoing care management follow up and provided patient with direct contact information for care management team 10/05/19 RN CM Collaboration with Remote Health nurse Larena Glassman RN . Received and reviewed the following patient update from [place]  . Patient is scheduled with the lymphedema Clinic on Thursday, September 23 @1 :30 pm . Determined [facility] will discharge patient from PT due to f/u an outpatient clinic such as the lymphedema clinic . Determined patient's lymphedema is severe and this is preventing her from being able to lift her legs independently in order to be able to get into her bed and or elevate her extremities independently . Determined patient's granddaughter is staying with the patient, however the patient will not ask for help and often sleeps on  the edge of her bed with her legs in a downward position . Discussed Larena Glassman RN has educated patient on the adverse effects this will have for the lymphedema and may impair of prevent resolution of the lymphedema . Determined Larena Glassman RN will re-evaluate next steps following patient's f/u with the lymphedema clinic scheduled for next week . Determined the [facility] Mobility PT will work with the patient once weekly since she will be d/c from [facility] . Discussed patient may need inpatient admission if she is unable to comply with the treatment recommendations provided by the lymphedema clinic  10/11/19 Inbound call from [place] . Received voice message from [place] advising Ms. Picariello completed her lymphedema new patient consultation with the following recommendations reviewed and noted . Assessment/Plan:  o ASSESSMENT The patient is a 74 y.o. with a medical diagnosis of lymphedema LE's. Due to lymphedema, the patient is at risk for infections and increased time for wound healing. Pt with left >right lower leg/foot swelling flare-up of chronic swelling x 5 months. Pt would benefit from skilled PT for getting/learning tools needed to reduce and manage swelling. Pt with 2 month history of falls and would benefit from HHPT for transfers/functional strengthening to reduce falls (or if not truly homebound, outpatient PT for this) Pt may also benefit from a compression pump if not making good progress with reduction when using velcro inelastic compression garments x 1 month. Compression Recommendations: velcro inelastic garments Therapy Diagnosis:  1. Lymphedema  Problem List: Problem List Problem List: Impaired ambulation, Impaired transfer/functional mobility, Need for equipment assessment, Pain, Lymphedema  Other Rehabilitation Considerations: Obesity, Poor activity tolerance and Financial limitation / insurance limitations Response to Evaluation: The session  was tolerated well, as evidenced by pt appreciative of info/education Rehabilitation Potential:  Motivation/Commitment to Therapy: Good Rehabilitation Potential: Good  Support Structure: Fair Family member willing to assist Goals: Time frame to achieve long term goals: 01/06/20 Goals Addressed  This Visit's Progress  . PT Goal 1 -  Pt/family are independent in applying compression inelastic velcro garments for LE's, correctly. Within 4 visits  . PT Goal 2 -  Pt verbalizes warning signs of infection and appropriate response. Within 4 visits.  . PT Goal 3 -  Patient in independent in daily Home exercise program for improving lymphatic circulation. Within 4 visits  PLAN Treatment Frequency, Duration, and Interventions: Plan Treatment Frequency and duration : 4 visits within 3 months (by 01/06/20) Time frame to achieve long term goals: 01/06/20 Recommend PT procedures: Manual Therapy, Orthotic Management  and Training, Self Care / Home Management, Therapeutic Exercise Necessity: Required to return to Premorbid environment (or reside in new living environment)? no Required to reduce ADL or IADL assistance to Premorbid level? no Recommended Consults: Prosthetist / Orthotist- Raford Pitcher in [place] of Care: Patient participated in plan of care development today. The patient has been instructed to contact our clinic if any questions or problems should arise. History: Personal Factors / Comorbidities that impact plan of care: Right knee buckling/s/p TKR and patellar dislocation; diabetes, arthritis, obesity Examination: Problem List Problem List: Impaired ambulation, Impaired transfer/functional mobility, Need for equipment assessment, Pain, Lymphedema  Presentation: Evolving/Changing Decision Making (based on functional outcome measure/patient assessment instrument): Moderate complexity  Electronically signed by: Evalee Mutton, PT 10/07/19 1521 10/19/19 RN Case Collaboration    . Received call from [place] RN with an update regarding patient's lymphedema progress  . Discussed Ms. Redcay is wearing Juxta Lite compression stockings and was able to demonstrate how to independently apply and remove the stockings . Discussed a [facility] was placed to [facility] to [facility] patient's PT at which time the PT evaluation for DME will be completed and faxed to [facility]  . Discussed Ms. Bradish's permanent W/C ramp has been placed and is ready for use  . Discussed patient needs assistance with transportation for upcoming MD appointments due to using up her "tickets" provided by [facility] for transportation, advised the embedded BSW is aware and will outreach to patient to offer assistance   Patient Self Care Activities:  . Self administers medications as prescribed . Attends all scheduled provider appointments . Calls pharmacy for medication refills . Calls provider office for new concerns or questions  Please see past updates related to this goal by clicking on the "Past Updates" button in the selected goal        Plan:   Telephone follow up appointment with care management team member scheduled for: 11/15/19  Barb Merino, RN, BSN, CCM Care Management Coordinator [facility]  Direct Phone: [phone]

## 2019-10-20 NOTE — Patient Instructions (Signed)
Social Worker Visit Information  Goals we discussed today:  Goals Addressed            This Visit's Progress   . Collaborate with RN Care Manager to perform appropriate assessments to assist with care coordination needs   On track    CARE PLAN ENTRY (see longitudinal plan of care for additional care plan information)  Current Barriers:  . Transportation . Limited access to caregiver . Inability to perform ADL's independently . Inability to perform IADL's independently . Chronic conditions including DM II, CKD III, and parenchymal renal HTN which increase patient risk for hospitalization  Social Work Clinical Goal(s):  [name] Over the next 60 days the patient will work with SW to identify transportation resources . Over the next 120 days the patient will work with SW to address ongoing care coordination needs  CCM SW Interventions: Completed 10/20/19 . Collaboration with Lorretta Harp with [employer] ([employer]) who reports the patients eligibility expired in 2019 . Successful completion of Part A of [employer] application . Collaboration with Dr Allyne Gee to request Part B of [employer] application be completed on the patients behalf . SW to continue to follow  Completed 10/19/19 . Inter-disciplinary care team collaboration (see longitudinal plan of care) . Collaboration with RN Care Manager who indicated patient interest in transportation resources . Successful outbound call placed to the patient to assist with resource needs o Patient reports she has generally used health plan transportation services but she has run out of rides for the remainder of the plan year . Discussed opportunity to apply for [facility] Access ([employer]) o Patient reports she does not particularly like [employer] but is willing to try it again . Determined the patient has used this resource in the past but is unsure if she is still considered an approved rider . Collaboration with Lorretta Harp with [facility] Access to  determine patient eligibility . Advised the patient SW will assist with a new application as needed pending response from Mrs Suzan Nailer . Scheduled follow up over the next week  Patient Self Care Activities:  . Patient verbalizes understanding of plan to work with SW to address care coordination needs . Self administers medications as prescribed . Calls pharmacy for medication refills . Calls provider office for new concerns or questions . Unable to perform ADLs independently . Unable to perform IADLs independently  Please see past updates related to this goal by clicking on the "Past Updates" button in the selected goal          Follow Up Plan: SW will follow up with patient by phone over the next two weeks.   Bevelyn Ngo, BSW, CDP Social Worker, Certified Dementia Practitioner [facility] Care Management [phone]

## 2019-10-21 ENCOUNTER — Ambulatory Visit (INDEPENDENT_AMBULATORY_CARE_PROVIDER_SITE_OTHER): Payer: Medicare Other | Admitting: Sports Medicine

## 2019-10-21 ENCOUNTER — Encounter: Payer: Self-pay | Admitting: Sports Medicine

## 2019-10-21 ENCOUNTER — Other Ambulatory Visit: Payer: Self-pay

## 2019-10-21 ENCOUNTER — Telehealth: Payer: Self-pay

## 2019-10-21 DIAGNOSIS — B351 Tinea unguium: Secondary | ICD-10-CM | POA: Diagnosis not present

## 2019-10-21 DIAGNOSIS — L84 Corns and callosities: Secondary | ICD-10-CM

## 2019-10-21 DIAGNOSIS — E1142 Type 2 diabetes mellitus with diabetic polyneuropathy: Secondary | ICD-10-CM | POA: Diagnosis not present

## 2019-10-21 DIAGNOSIS — I890 Lymphedema, not elsewhere classified: Secondary | ICD-10-CM | POA: Diagnosis not present

## 2019-10-21 DIAGNOSIS — I739 Peripheral vascular disease, unspecified: Secondary | ICD-10-CM

## 2019-10-21 DIAGNOSIS — M79675 Pain in left toe(s): Secondary | ICD-10-CM

## 2019-10-21 DIAGNOSIS — M79674 Pain in right toe(s): Secondary | ICD-10-CM | POA: Diagnosis not present

## 2019-10-21 NOTE — Progress Notes (Signed)
  Subjective: Mary [name] is a 74 y.o. female patient with history of diabetes who presents to office today complaining of long,mildly painful nails and callus while ambulating in shoes; unable to trim. Patient denies any bleeding, Patient denies any new changes in medication or new problems. No other issues noted.  FBS 110  Assisted by granddaughter today  Patient Active Problem List   Diagnosis Date Noted  . Diabetes mellitus with stage 3 chronic kidney disease (HCC) 02/21/2018  . 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 Primary osteoarthritis of both knees 02/21/2018  . Class 3 severe obesity due to excess calories with serious comorbidity and body mass index (BMI) of 45.0 to 49.9 in adult (HCC) 02/21/2018  . Skin ulcer of right foot with fat layer exposed (HCC) 02/18/2018  . Morbid (severe) obesity due to excess calories (HCC) 11/18/2017  . Knee pain 07/16/2015  . Lateral dislocation of right patella 05/04/2015  . Rupture of right quadriceps tendon 05/24/2014  . S/P TKR (total knee replacement) 05/05/2012   Current Outpatient Medications on File Prior to Visit  Medication Sig Dispense Refill  . Assure Comfort Lancets 28G MISC     . Blood Glucose Monitoring Suppl (ONETOUCH VERIO FLEX SYSTEM) w/Device KIT Use as directed to check blood sugars 2 times per day dx: e11.65 1 kit 1  . cetirizine (ZYRTEC) 10 MG tablet TAKE 1 TABLET BY MOUTH EVERY DAY 90 tablet 1  . dorzolamide-timolol (COSOPT) 22.3-6.8 MG/ML ophthalmic solution Place 1 drop into the right eye 2 (two) times daily.    . fluorometholone (FML) 0.1 % ophthalmic suspension Place 1 drop into the right eye 3 (three) times daily.     . fluticasone (FLONASE) 50 MCG/ACT nasal spray SPRAY 1 SPRAY INTO EACH NOSTRIL EVERY DAY 32 mL 1  . furosemide (LASIX) 40 MG tablet Take 1 tablet (40 mg total) by mouth daily. 90 tablet 2  . glucose blood (ONETOUCH VERIO) test strip Use as directed to check blood sugars 2 times per day dx:  e11.65 100 each 3  . HYDROcodone-acetaminophen (NORCO) 10-325 MG tablet Take 1 tablet by mouth 5 (five) times daily as needed.    . KLOR-CON M20 20 MEQ tablet Take 20 mEq by mouth daily. with food  1  . levothyroxine (SYNTHROID) 125 MCG tablet Take 125 mcg by mouth daily before breakfast.    . loratadine (CLARITIN) 10 MG tablet Take 10 mg by mouth daily.     . magnesium oxide (MAG-OX) 400 MG tablet TAKE 1 TABLET BY MOUTH EVERY EVENING 90 tablet 1  . metoprolol succinate (TOPROL-XL) 50 MG 24 hr tablet     . metoprolol tartrate (LOPRESSOR) 50 MG tablet Take 50 mg by mouth 2 (two) times daily.     . omeprazole (PRILOSEC) 20 MG capsule TAKE 1 CAPSULE BY MOUTH  DAILY 90 capsule 3  . OneTouch Delica Lancets 33G MISC Use as directed to check blood sugars 2 times per day dx: e11.65 100 each 3  . pravastatin (PRAVACHOL) 40 MG tablet TAKE 1 TABLET BY MOUTH EVERY DAY 90 tablet 1  . prednisoLONE acetate (PRED FORTE) 1 % ophthalmic suspension Place 1 drop into the right eye 3 (three) times daily.     . predniSONE (DELTASONE) 10 MG tablet Take by mouth.     . telmisartan (MICARDIS) 80 MG tablet TAKE 1 TABLET BY MOUTH  DAILY 90 tablet 3  . triamcinolone cream (KENALOG) 0.1 % APPLY TO AFFECTED AREA TWICE A DAY 30 g   0   No current facility-administered medications on file prior to visit.   No Known Allergies  Recent Results (from the past 2160 hour(s))  Basic metabolic panel     Status: Abnormal   Collection Time: 08/08/19  4:08 PM  Result Value Ref Range   Sodium 142 135 - 145 mmol/L   Potassium 3.8 3.5 - 5.1 mmol/L   Chloride 109 98 - 111 mmol/L   CO2 24 22 - 32 mmol/L   Glucose, Bld 100 (H) 70 - 99 mg/dL    Comment: Glucose reference range applies only to samples taken after fasting for at least 8 hours.   BUN 22 8 - 23 mg/dL   Creatinine, Ser 1.12 (H) 0.44 - 1.00 mg/dL   Calcium 9.5 8.9 - 10.3 mg/dL   GFR calc non Af Amer 49 (L) >60 mL/min   GFR calc Af Amer 57 (L) >60 mL/min   Anion gap 9 5 -  15    Comment: Performed at [facility], [address] [place], [place] [address]  CBC     Status: Abnormal   Collection Time: 08/08/19  4:08 PM  Result Value Ref Range   WBC 4.8 4.0 - 10.5 K/uL   RBC 3.39 (L) 3.87 - 5.11 MIL/uL   Hemoglobin 10.6 (L) 12.0 - 15.0 g/dL   HCT 32.0 (L) 36 - 46 %   MCV 94.4 80.0 - 100.0 fL   MCH 31.3 26.0 - 34.0 pg   MCHC 33.1 30.0 - 36.0 g/dL   RDW 12.4 11.5 - 15.5 %   Platelets 260 150 - 400 K/uL   nRBC 0.0 0.0 - 0.2 %    Comment: Performed at [facility], [address] [place], [place] [address]  Troponin I (High Sensitivity)     Status: None   Collection Time: 08/08/19  4:08 PM  Result Value Ref Range   Troponin I (High Sensitivity) 9 <18 ng/L    Comment: (NOTE) Elevated high sensitivity troponin I (hsTnI) values and significant  changes across serial measurements may suggest ACS but many other  chronic and acute conditions are known to elevate hsTnI results.  Refer to the "Links" section for chest pain algorithms and additional  guidance. Performed at [facility], [address] [place], [place] [address]   Hepatic function panel     Status: None   Collection Time: 08/08/19  8:34 PM  Result Value Ref Range   Total Protein 7.6 6.5 - 8.1 g/dL   Albumin 3.6 3.5 - 5.0 g/dL   AST 24 15 - 41 U/L   ALT 17 0 - 44 U/L   Alkaline Phosphatase 82 38 - 126 U/L   Total Bilirubin 1.0 0.3 - 1.2 mg/dL   Bilirubin, Direct 0.2 0.0 - 0.2 mg/dL   Indirect Bilirubin 0.8 0.3 - 0.9 mg/dL    Comment: Performed at [facility], [address] [place], [place] [address]  Brain natriuretic peptide     Status: None   Collection Time: 08/08/19  8:34 PM  Result Value Ref Range   B Natriuretic Peptide 36.5 0.0 - 100.0 pg/mL    Comment: Performed at [facility], [address] [place], [place] [address]  HM DIABETES EYE EXAM     Status: Abnormal   Collection Time: 10/18/19 12:00 AM  Result Value Ref Range   HM Diabetic Eye  Exam Retinopathy (A) No Retinopathy    Objective: General: Patient is   awake, alert, and oriented x 3 and in no acute distress.  Integument: Skin is warm, dry and supple bilateral. Nails are tender, long, thickened and  dystrophic with subungual debris, consistent with onychomycosis, 1-5 bilateral. No signs of infection. + sub met 1 & 5 preulcerative lesions present bilateral. Remaining integument unremarkable.  Vasculature:  Dorsalis Pedis pulse 1/4 bilateral. Posterior Tibial pulse  0/4 bilateral.  Capillary fill time <3 sec 1-5 bilateral. Scant hair growth to the level of the digits. Temperature gradient within normal limits. No varicosities present bilateral. Chronic edema present bilateral.   Neurology: The patient has absent sensation measured with a 5.07/10g Semmes Weinstein Monofilament at all pedal sites bilateral . Vibratory sensation absent bilateral with tuning fork. No Babinski sign present bilateral.   Musculoskeletal: Asymptomatic bunion, pes planus, and hammertoe pedal deformities noted bilateral. Muscular strength 4/5 in all lower extremity muscular groups bilateral without pain on range of motion . No tenderness with calf compression bilateral.  Assessment and Plan: Problem List Items Addressed This Visit    None    Visit Diagnoses    Pain due to onychomycosis of toenails of both feet    -  Primary   Pre-ulcerative calluses       PVD (peripheral vascular disease) (HCC)       Relevant Medications   metoprolol succinate (TOPROL-XL) 50 MG 24 hr tablet   Lymphedema       Diabetic polyneuropathy associated with type 2 diabetes mellitus (HCC)          -Examined patient. -Discussed and educated patient on diabetic foot care, especially with  regards to the vascular, neurological and musculoskeletal systems. -Mechanically debrided  Callus x 4 using sterile chisel blade and all nails 1-5 bilateral using sterile nail nipper and filed with dremel without incident  -Previous  right foot ulcer is healed advised to monitor if bleeding recurs to apply betadine and return to office for recheck -May use compression garment daily until she can get edema pumps -Answered all patient questions -Patient to return  in 9-10 weeks for at risk foot care -Patient advised to call the office if any problems or questions arise in the meantime.  Titorya Stover, DPM 

## 2019-10-21 NOTE — Telephone Encounter (Signed)
  Chronic Care Management   Outreach Note  10/21/2019 Name: Mary [name] MRN: [id number] DOB: 1946/01/05  Referred by: Dorothyann Peng, MD Reason for referral : Care Coordination   SW submitted completed SCAT application to [employer] secure email.  Follow Up Plan: The care management team will reach out to the patient again over the next 14 days.   Bevelyn Ngo, BSW, CDP Social Worker, Certified Dementia Practitioner [facility] Care Management [phone]

## 2019-10-22 ENCOUNTER — Telehealth: Payer: Medicare Other

## 2019-10-25 DIAGNOSIS — N183 Chronic kidney disease, stage 3 unspecified: Secondary | ICD-10-CM | POA: Diagnosis not present

## 2019-10-25 DIAGNOSIS — M1712 Unilateral primary osteoarthritis, left knee: Secondary | ICD-10-CM | POA: Diagnosis not present

## 2019-10-25 DIAGNOSIS — Z96651 Presence of right artificial knee joint: Secondary | ICD-10-CM | POA: Diagnosis not present

## 2019-10-25 DIAGNOSIS — E1122 Type 2 diabetes mellitus with diabetic chronic kidney disease: Secondary | ICD-10-CM | POA: Diagnosis not present

## 2019-10-25 DIAGNOSIS — Z7982 Long term (current) use of aspirin: Secondary | ICD-10-CM | POA: Diagnosis not present

## 2019-10-25 DIAGNOSIS — I129 Hypertensive chronic kidney disease with stage 1 through stage 4 chronic kidney disease, or unspecified chronic kidney disease: Secondary | ICD-10-CM | POA: Diagnosis not present

## 2019-10-25 DIAGNOSIS — Z9181 History of falling: Secondary | ICD-10-CM | POA: Diagnosis not present

## 2019-10-26 ENCOUNTER — Telehealth: Payer: Self-pay

## 2019-10-26 ENCOUNTER — Ambulatory Visit: Payer: Medicare Other

## 2019-10-26 DIAGNOSIS — E1122 Type 2 diabetes mellitus with diabetic chronic kidney disease: Secondary | ICD-10-CM

## 2019-10-26 DIAGNOSIS — N183 Chronic kidney disease, stage 3 unspecified: Secondary | ICD-10-CM

## 2019-10-26 NOTE — Patient Instructions (Signed)
Social Worker Visit Information  Goals we discussed today:  Goals Addressed            This Visit's Progress   . COMPLETED: "my mom needs rehab"       Daughter Stated  CARE PLAN ENTRY (see longitudinal plan of care for additional care plan information)  Current Barriers:  . Ongoing cellulitis infection limiting patient ability to perform ADL's on her own . Inability to maneuver around the home due to decreased ambulation ability and insufficient DME in the home (see alternative goal's regarding obtaining DME) . Limited access to caregiver . Lacks knowledge of how to access care in a rehab facility . Chronic conditions including DM II and CKD III which put patient at increased risk for hospitalization  Social Work Clinical Goal(s):  [name] Over the next 10 days the patient and her daughter will work with embedded care management team to address concerns surrounding ADL limitations  10/26/19- Goal closed; patient remains in the home with adequate help  CCM SW Interventions: Completed 09/22/19 . Inbound call received from Vito Berger with [employer] who reports a temporary ramp will be installed "today" 09/22/19 . Outbound call placed to Bethann Berkshire RN with [facility]; voice message left to inform of ramp timeline . Outbound call placed to the patients daughter to inform of plan for ramp installation o [name] reports she just spoke with the patient who stated a team was there already but had to leave due to bad weather o Discussed the team may return later this afternoon or tomorrow o Determined the patient is still in need of a wheelchair in order to leave the home o Discussed the patient has previously informed RN Care Manager of desire to purchase a wheel chair out of pocket in order to use Medicare benefit for an electric wheelchair o Campbell Stall SW will outreach patient care team for information . Collaboration with Delsa Sale embedded RN Care Manager and  [name] RN Care Manager with [facility] [facility] to inform of ramp installation and request feedback on patient DME needs . Successful outbound call placed to the patient to review DME options o Patient reports she planned to rent a manual wheelchair through [facility] but unfortunately they do not have the correct size available o Discussed the patient does not want a manual wheelchair ordered through Medicare benefit due to desire to obtain an electric wheelchair o Advised the patient the embedded care team would follow up with [facility] regarding DME needs . Collaboration with Delsa Sale RN Care Manager to discuss interventions and plan to obtain a wheelchair on behalf of the patient  Completed 09/21/19 . Inbound call received from Vito Berger with [employer] to discuss patient ramp needs o Discussed the patient may be eligible to receive a temporary ramp through [facility] to assist with needed appointments o Advised by Elnita Maxwell she would speak with her team to determine supplies on hand and get back to SW . Successful outbound call placed to the patients daughter to provide an update on plan for wheelchair ramp . Collaboration with RN Care Manager regarding plan and to obtain update on wheelchair needs of the patient  Completed 09/17/19 with Bethann Berkshire RN from [facility] . Inbound call received from Bethann Berkshire RN with [facility] o Patient admitted to [facility] 09/16/19 o Bethann Berkshire reports the patient is unable to leave her home safely and would benefit from attending a Lymphedema Clinic o Discussed the patient is  active with [employer] under "[facility]" program o Shela Nevin SW would [employer] to gather information on home modification timeline . Unsuccessful outbound call placed to [name] with [employer] o Voice message left requesting a return call . Secure e-mail  collaboration with Gene Manson Passey with [employer] to request feedback surrounding ramp installation timeframe . SW will follow up with Bethann Berkshire over the next week  Completed 09/15/19 with the patients daughter Lavinia Mcneely . Inter-disciplinary care team collaboration (see longitudinal plan of care) . Inbound call received from the patients daughter who reports the patient needs inpatient rehab and she would like SW assistance with placement . Determined the patient continues to have difficulty with ambulation in the home o The patient has dealt with a cellulitis infection which is causing edema to the lower extremities o The patient does not own a wheel chair but has been working with [employer] to obtain DME in the home . Provided education that due to Medicare guidelines, the patients health plan will not cover admission to a SNF for rehab without a 3 mid-night hospital admission o Patient seen in ED 7/25 for leg swelling, patients daughter reports this swelling has not worsened . Campbell Stall, SW will collaborate with RN Care Manager and Dr. Allyne Gee to determine available options to the patient . Collaboration with Delsa Sale, RN Care Manager who discussed recent referral to [facility] for in home care needs . Outbound call placed to [facility], spoke with Revonda Standard who confirms receipt of referral sent on 8/26. No visit is scheduled at this time o Revonda Standard advised SW she will reach out to her leadership team for more information and contact SW later in the day . Collaboration with Dr. Allyne Gee to inform of the patients daughters request and current barriers o Dr. Allyne Gee agrees with awaiting for return call from [facility] . Received voice message from Cindra Eves with [facility] stating she attempted to schedule an intake appointment with the patient but was unsuccessful.  o SW placed an unsuccessful outbound call to Mrs. Darcella Gasman, SW left voice message providing  contact number to the patients daughter as well . Successful outbound call placed to the patients daughter to provide updates on above interventions o Determined the patient has been in contact with [facility] and has an appointment scheduled for 9/2 at 1:00 pm . SW will continue to folow  Patient Self Care Activities:  . Attends all scheduled provider appointments . Calls provider office for new concerns or questions . Unable to perform ADLs independently . Unable to perform IADLs independently  Please see past updates related to this goal by clicking on the "Past Updates" button in the selected goal      . Collaborate with RN Care Manager to perform appropriate assessments to assist with care coordination needs       CARE PLAN ENTRY (see longitudinal plan of care for additional care plan information)  Current Barriers:  . Transportation . Limited access to caregiver . Inability to perform ADL's independently . Inability to perform IADL's independently . Chronic conditions including DM II, CKD III, and parenchymal renal HTN which increase patient risk for hospitalization  Social Work Clinical Goal(s):  [name] Over the next 60 days the patient will work with SW to identify transportation resources . Over the next 120 days the patient will work with SW to address ongoing care coordination needs  CCM SW Interventions: Completed 10/26/19 with  daughter [name] . Communication with patient daughter, [name], who requests SW assistance with obtaining a handicap placard to be used when transporting the patient . Advised [name] paperwork must be filled out by the patients provider . Collaboration with Mariam Dollar, CMA to request assistance with handicap placard paperwork  Completed 10/20/19 . Collaboration with Lorretta Harp with [employer] (SCAT) who reports the patients eligibility expired in 2019 . Successful completion of Part A of SCAT application . Collaboration with Dr  Allyne Gee to request Part B of SCAT application be completed on the patients behalf . SW to continue to follow  Completed 10/19/19 . Inter-disciplinary care team collaboration (see longitudinal plan of care) . Collaboration with RN Care Manager who indicated patient interest in transportation resources . Successful outbound call placed to the patient to assist with resource needs o Patient reports she has generally used health plan transportation services but she has run out of rides for the remainder of the plan year . Discussed opportunity to apply for [facility] Access (SCAT) o Patient reports she does not particularly like SCAT but is willing to try it again . Determined the patient has used this resource in the past but is unsure if she is still considered an approved rider . Collaboration with Lorretta Harp with [facility] Access to determine patient eligibility . Advised the patient SW will assist with a new application as needed pending response from Mrs Suzan Nailer . Scheduled follow up over the next week  Patient Self Care Activities:  . Patient verbalizes understanding of plan to work with SW to address care coordination needs . Self administers medications as prescribed . Calls pharmacy for medication refills . Calls provider office for new concerns or questions . Unable to perform ADLs independently . Unable to perform IADLs independently  Please see past updates related to this goal by clicking on the "Past Updates" button in the selected goal         Follow Up Plan: SW will follow up with patient by phone over the next two weeks.   Bevelyn Ngo, BSW, CDP Social Worker, Certified Dementia Practitioner [facility] Care Management [phone]

## 2019-10-26 NOTE — Telephone Encounter (Signed)
°  Chronic Care Management   Outreach Note  10/26/2019 Name: Mary [name] MRN: [id number] DOB: 1945/02/16  Referred by: Dorothyann Peng, MD Reason for referral : Care Coordination   An unsuccessful telephone outreach was attempted today. The patient was referred to the case management team for assistance with care management and care coordination.   Follow Up Plan: A HIPAA compliant phone message was left for the patient providing contact information and requesting a return call.  The care management team will reach out to the patient again over the next 7 days.   Bevelyn Ngo, BSW, CDP Social Worker, Certified Dementia Practitioner [facility] Care Management [phone]

## 2019-10-26 NOTE — Chronic Care Management (AMB) (Signed)
Chronic Care Management    Social Work Follow Up Note  10/26/2019 Name: Mary [name] MRN: [id number] DOB: 1945/08/06  Mary [name] is a 74 y.o. year old female who is a primary care patient of Glendale Chard, MD. The CCM team was consulted for assistance with care coordination.   Review of patient status, including review of consultants reports, other relevant assessments, and collaboration with appropriate care team members and the patient's provider was performed as part of comprehensive patient evaluation and provision of chronic care management services.    SDOH (Social Determinants of Health) assessments performed: No    Outpatient Encounter Medications as of 10/26/2019  Medication Sig Note   Assure Comfort Lancets 28G MISC     Blood Glucose Monitoring Suppl (ONETOUCH VERIO FLEX SYSTEM) w/Device KIT Use as directed to check blood sugars 2 times per day dx: e11.65    cetirizine (ZYRTEC) 10 MG tablet TAKE 1 TABLET BY MOUTH EVERY DAY    dorzolamide-timolol (COSOPT) 22.3-6.8 MG/ML ophthalmic solution Place 1 drop into the right eye 2 (two) times daily.    fluorometholone (FML) 0.1 % ophthalmic suspension Place 1 drop into the right eye 3 (three) times daily.     fluticasone (FLONASE) 50 MCG/ACT nasal spray SPRAY 1 SPRAY INTO EACH NOSTRIL EVERY DAY    furosemide (LASIX) 40 MG tablet Take 1 tablet (40 mg total) by mouth daily.    glucose blood (ONETOUCH VERIO) test strip Use as directed to check blood sugars 2 times per day dx: e11.65    HYDROcodone-acetaminophen (NORCO) 10-325 MG tablet Take 1 tablet by mouth 5 (five) times daily as needed.    KLOR-CON M20 20 MEQ tablet Take 20 mEq by mouth daily. with food 02/23/2018: Patient instructed to hold this medication due to abnormal Potassium level of 6.2 on 02/18/18, per MD, Dr. Glendale Chard. Patient was given verbal education related to complications from hypo and hyperkalemia and importance of following MD recommendations.  Patient instructed to resume this medication when directed by PCP. Patient verbalizes understanding.    levothyroxine (SYNTHROID) 125 MCG tablet Take 125 mcg by mouth daily before breakfast.    loratadine (CLARITIN) 10 MG tablet Take 10 mg by mouth daily.     magnesium oxide (MAG-OX) 400 MG tablet TAKE 1 TABLET BY MOUTH EVERY EVENING    metoprolol succinate (TOPROL-XL) 50 MG 24 hr tablet     metoprolol tartrate (LOPRESSOR) 50 MG tablet Take 50 mg by mouth 2 (two) times daily.     omeprazole (PRILOSEC) 20 MG capsule TAKE 1 CAPSULE BY MOUTH  DAILY    OneTouch Delica Lancets 21J MISC Use as directed to check blood sugars 2 times per day dx: e11.65    pravastatin (PRAVACHOL) 40 MG tablet TAKE 1 TABLET BY MOUTH EVERY DAY    prednisoLONE acetate (PRED FORTE) 1 % ophthalmic suspension Place 1 drop into the right eye 3 (three) times daily.     predniSONE (DELTASONE) 10 MG tablet Take by mouth.     telmisartan (MICARDIS) 80 MG tablet TAKE 1 TABLET BY MOUTH  DAILY    triamcinolone cream (KENALOG) 0.1 % APPLY TO AFFECTED AREA TWICE A DAY    No facility-administered encounter medications on file as of 10/26/2019.     Goals Addressed            This Visit's Progress    COMPLETED: "my mom needs rehab"       Daughter Mary [name] (see longitudinal plan of  care for additional care plan information)  Current Barriers:   Ongoing cellulitis infection limiting patient ability to perform ADL's on her own  Inability to maneuver around the home due to decreased ambulation ability and insufficient DME in the home (see alternative goal's regarding obtaining DME)  Limited access to caregiver  Lacks knowledge of how to access care in a rehab facility  Chronic conditions including DM II and CKD III which put patient at increased risk for hospitalization  Social Work Clinical Goal(s):   Over the next 10 days the patient and her daughter will work with embedded care management  team to address concerns surrounding ADL limitations  10/26/19- Goal closed; patient remains in the home with adequate help  CCM SW Interventions: Completed 09/22/19  Inbound call received from Darl Householder with [employer] who reports a temporary ramp will be installed "today" 09/22/19  Outbound call placed to [place] RN with [facility]; voice message left to inform of ramp timeline  Outbound call placed to the patients daughter to inform of plan for ramp installation o [name] reports she just spoke with the patient who stated a team was there already but had to leave due to bad weather o Discussed the team may return later this afternoon or tomorrow o Determined the patient is still in need of a wheelchair in order to leave the home o Discussed the patient has previously informed RN Care Manager of desire to purchase a wheel chair out of pocket in order to use Medicare benefit for an electric wheelchair o Ree Edman SW will outreach patient care team for information  Collaboration with Barb Merino embedded RN Care Manager and [place] with Aging Gracefully Program to inform of ramp installation and request feedback on patient DME needs  Successful outbound call placed to the patient to review DME options o Patient reports she planned to rent a manual wheelchair through [facility] but unfortunately they do not have the correct size available o Discussed the patient does not want a manual wheelchair ordered through Medicare benefit due to desire to obtain an electric wheelchair o Advised the patient the embedded care team would follow up with [facility] regarding DME needs  Collaboration with [place] to discuss interventions and plan to obtain a wheelchair on behalf of the patient  Completed 09/21/19  Inbound call received from Darl Householder with [employer] to discuss patient ramp  needs o Discussed the patient may be eligible to receive a temporary ramp through [facility] to assist with needed appointments o Advised by Malachy Mood she would speak with her team to determine supplies on hand and get back to SW  Successful outbound call placed to the patients daughter to provide an update on plan for wheelchair ramp  Collaboration with RN Care Manager regarding plan and to obtain update on wheelchair needs of the patient  Completed 09/17/19 with Larena Glassman RN from [place] call received from [place] with [facility] o Patient admitted to [facility] 09/16/19 o Larena Glassman reports the patient is unable to leave her home safely and would benefit from attending a Lymphedema Clinic o Discussed the patient is active with [employer] under "Aging Gracefully" program o [place] would [employer] to gather information on home modification timeline  Unsuccessful outbound call placed to [employer] with [employer] o Voice message left requesting a return call  [employer] collaboration  with Gene Owens Shark with [employer] to request feedback surrounding ramp installation timeframe  SW will follow up with Larena Glassman over the next week  Completed 09/15/19 with the patients daughter Adriyana Greenbaum  Inter-disciplinary care team collaboration (see longitudinal plan of care)  Inbound call received from the patients daughter who reports the patient needs inpatient rehab and she would like SW assistance with placement  Determined the patient continues to have difficulty with ambulation in the home o The patient has dealt with a cellulitis infection which is causing edema to the lower extremities o The patient does not own a wheel chair but has been working with Consulting civil engineer to obtain DME in the home  Provided education that due to Medicare guidelines, the patients health plan will not cover admission to a SNF  for rehab without a 3 mid-night hospital admission o Patient seen in ED 7/25 for leg swelling, patients daughter reports this swelling has not worsened  Ree Edman, SW will collaborate with RN Care Manager and Dr. Baird Cancer to determine available options to the patient  Collaboration with Barb Merino, RN Care Manager who discussed recent referral to [facility] for in home care needs  Outbound call placed to [place], spoke with Ebony Hail who confirms receipt of referral sent on 8/26. No visit is scheduled at this time o Ebony Hail advised SW she will reach out to her leadership team for more information and contact SW later in the day  Collaboration with Dr. Baird Cancer to inform of the patients daughters request and current barriers o Dr. Baird Cancer agrees with awaiting for return call from [place] voice message from Reche [place] with [facility] stating she attempted to schedule an intake appointment with the patient but was unsuccessful.  o SW placed an unsuccessful outbound call to Mrs. Beryl Meager, SW left voice message providing contact number to the patients daughter as well  Successful outbound call placed to the patients daughter to provide updates on above interventions o Determined the patient has been in contact with [facility] and has an appointment scheduled for 9/2 at 1:00 pm  SW will continue to folow  Patient Self Care Activities:   Attends all scheduled provider appointments  Calls provider office for new concerns or questions  Unable to perform ADLs independently  Unable to perform IADLs independently  Please see past updates related to this goal by clicking on the "Past Updates" button in the selected goal       Collaborate with RN Care Manager to perform appropriate assessments to assist with care coordination needs       CARE PLAN ENTRY (see longitudinal plan of care for additional care plan information)  Current Barriers:    Transportation  Limited access to caregiver  Inability to perform ADL's independently  Inability to perform IADL's independently  Chronic conditions including DM II, CKD III, and parenchymal renal HTN which increase patient risk for hospitalization  Social Work Clinical Goal(s):   Over the next 60 days the patient will work with SW to identify transportation resources  Over the next 120 days the patient will work with SW to address ongoing care coordination needs  CCM SW Interventions: Completed 10/26/19 with daughter [name]  Communication with patient daughter, [name], who requests SW assistance with obtaining a handicap placard to be used when transporting the patient  Ree Edman paperwork must be filled out by the patients provider  Collaboration with [name], CMA to request assistance with handicap placard paperwork  Completed 10/20/19  Collaboration with [name] with [facility] ([facility]) who reports the patients eligibility expired in 2019  Successful completion of Part A of SCAT application  Collaboration with Dr Baird Cancer to request Part B of SCAT application be completed on the patients behalf  SW to continue to follow  Completed 10/19/19  Inter-disciplinary care team collaboration (see longitudinal plan of care)  Collaboration with RN Care Manager who indicated patient interest in transportation resources  Successful outbound call placed to the patient to assist with resource needs o Patient reports she has generally used health plan transportation services but she has run out of rides for the remainder of the plan year  Discussed opportunity to apply for [employer] (SCAT) o Patient reports she does not particularly like SCAT but is willing to try it again  Determined the patient has used this resource in the past but is unsure if she is still considered an approved Solicitor with [name] with [place]  Access to determine patient eligibility  Advised the patient SW will assist with a new application as needed pending response from [name]  Scheduled follow up over the next week  Patient Self Care Activities:   Patient verbalizes understanding of plan to work with SW to address care coordination needs  Self administers medications as prescribed  Calls pharmacy for medication refills  Calls provider office for new concerns or questions  Unable to perform ADLs independently  Unable to perform IADLs independently  Please see past updates related to this goal by clicking on the "Past Updates" button in the selected goal          Follow Up Plan: SW will follow up with patient by phone over the next two weeks.   [name], BSW, CDP Social Worker, Certified Dementia Practitioner [place] [facility] [place] Management [phone]  Total time spent performing care coordination and/or care management activities with the patient by phone or face to face = 10 minutes.

## 2019-10-29 ENCOUNTER — Other Ambulatory Visit: Payer: Self-pay | Admitting: Internal Medicine

## 2019-10-29 ENCOUNTER — Other Ambulatory Visit: Payer: Self-pay | Admitting: *Deleted

## 2019-10-29 ENCOUNTER — Encounter: Payer: Self-pay | Admitting: *Deleted

## 2019-10-29 NOTE — Patient Outreach (Signed)
Aging Gracefully Program  RN Visit # 3  10/29/2019  Mary [name] 1945-06-08 [id number]  Visit:   Aging Gracefully RN Visit # 3  Start Time:   15:00 End Time:   15:15 Total Minutes:   15  Readiness To Change Score:     Universal RN Interventions: Calendar Distribution: Yes (reviewed with client today) Exercise Review: Yes (reviewed with client today) Medications: Yes (reviewed with client today) Pain: Yes (reviewed with client today) Fall Prevention: Yes (reviewed with client today)  Healthcare Provider Communication: Did Surveyor, mining With [employer] Provider?: No  Clinician View of Client Situation: Diplomatic Services operational officer Of Client Situation: [place] confirms today that the home modifications team has completed her permanent ramp and she has been occasionally using the ramp- reports it is really helping her get in/ out of her home.  Interior home modifications have been initiated and are in prgress.  Mary [name] continues to struggle with mobility and is currently working with her PCP team to obtain a motorized wheelchair.  She reports that she again slid off her bed onto the floor today, and states was assisted back up by the EMT's who confirmed that she did not incur any serious injury; client denies soreness/ pain and reports this episode is similar to others that have occured in the past.  She appears to be on track overall in meeting her previously established Aging Gracefully goals.   Client's View of His/Her Situation: Client View Of His/Her Situation: "I am doing about the same; I am glad they have started completing the work on my home-- it is really going to be nice and will help so much to have it all in place.  I have not had any falls, but I did slide off the bed this morning, like I have done in the past; I just sat down on the floor, and we had the EMT come to help me up because no one here at the house can help me get up from the floor.  I didn't injure myself and I am not  even sore.  We are going to look inot obtaining a new mattress, that's really what needs to be done.  I am still working with my PCP team to get the motorized wheelchair that I want."  OT Update: Will update Aging Gracefully team on successful completion of Aging Gracefully RN Visit # 3 by phone  Session Summary: Mary [name] remains engaged with the Aging Gracefully team/ project and is very excited to have her home modifications in progress; she continues to be a high fall risk, but verbalizes appropriate plan of action to prevent falls; she appears to be on track in meeting her previously established Aging Gracefully goals around fall prevention.  Brainstorming was completed today and patient reports that she and her family are considering obtaining her a new mattress to prevent her from ongoing episodes of soft slides off the bed- thisd was encouraged.  We scheduled our next and final RN visit as an in-person visit at her home for next month.  Caryl Pina, RN, BSN, [employer] [facility] Care Management  [phone]

## 2019-10-29 NOTE — Patient Instructions (Signed)
CLIENT/RN ACTION PLAN - FALL PREVENTION  Registered Nurse:  [name] RN Date: 10/29/2019  Client Name: Mary [name] Client ID: [id number]   Target Area:  FALL PREVENTION    Why Problem May Occur: Mobility issues due to chronic pain from arthritis, new diagnosis of lymphedema, decreased vision   Target Goal: "I don't want to have any more falls"   STRATEGIES Coping Strategies Ideas  Change Positions Slowly: lying to sitting, sitting to standing . Changing positions can make people lightheaded. . When getting up in the morning sit for a few minutes, before standing. . Continue using your walker . Continue working with Proofreader at your primary care office to obtain the equipment you need at home, through [employer]  . Continue to take your time with all activity: DON'T RUSH!!! . Wear good shoes with sturdy soles . Stand for a few minutes before walking and hold on to sturdy furniture or countertop.  .   Drink water . Dehydration can make people dizzy. . Ask your healthcare provider how much water you can drink. . Decrease caffeine, caffeine makes you urinate a lot, which can make you dehydrated. .   If you fall, tell someone . Tell a friend or family member even if you didn't get hurt. . Tell your healthcare provider if you fall.  They can help you figure out why. . Continue to wear your life-alert system at all times . Write any falls that you have down on your calendar and add what you think caused you to fall  Get your vision and hearing checked . Poor vision and hearing loss can make people fall. . Glaucoma and diabetes can cause poor vision. . Continue attending your routine eye provider appointments  Other .    Prevention Ideas  Review your Medicines:  Your use of blood pressure medication and pain medication could make you feel light-headed or dizzy; if you experience this, let your doctor know right away . Many medicines can make you dizzy or  sleepy and increase your risk of falling. . Your Aging Gracefully Nurse will look at your medications and see if you are taking any that might cause falling. . Use your pill box to keep your medications organized; fill it up weekly, using the method we discussed  Activity and Exercise- try to start doing some of the Aging Gracefully exercises that we went over during our visit -- start gradually and slowly build up the amount you do -- don't ever over-do exercise or exercise so much that it causes you pain -- always maintain good posture with your body mechanics . Aging Gracefully exercises . Walking (inside or outside) . Dancing . Gardening . Housework:  cooking, Education officer, environmental, Pharmacologist . Exercise while watching TV . Swimming or water aerobics.   Strengthen Bones . Exercise makes your bones stronger. . Vitamin D and Calcium make your bones stronger.  Ask your Healthcare provider if you should be taking them. . Your body makes vitamin D from the sun.  Sit in the sun for 10 minutes every day (without sunscreen). .   Control your urine  . Prevent constipation . Cut back on caffeinated drinks. . Don't wait to urinate. . If diabetic, control your blood sugar. . Ask your Aging Gracefully Nurse and Healthcare Provider  about urine control. .   Control your blood sugar (if you are diabetic) . High blood sugar can cause frequent urination, poor vision, and numbness in your feet, which  can make you fall.   . Low blood sugar can cause confusion and dizziness. .   Other coping strategies 1. 2. 3. 4. 5.   PRACTICE It is important to practice the strategies so we can determine if they will be effective in helping to reach the goal.    Follow these specific recommendations:  If you come up with any additional strategies that keep you from falling, write them down so we can talk about them the next time we talk    10/29/19:  Maridel it was great talking with you this afternoon; it sounds as if a lot  of progress has started with your home modifications-- I am glad to hear that the permanent ramp has been installed and that you have been able to use it and find it helpful.  I think your idea to look into getting a new mattress is excellent and I encourage you and your family to look in to this so you will not continue sliding off of your bed.  I look forward to visiting you at your home next month and seeing the home repairs!  9/27/21Randie Heinz job putting these strategies in place for your normal routine, Luane!  Keep up the great work!  I am glad that you have the temporary ramp in place and that you are finding it helpful in getting in/ out of your home.  I am also glad that you are finding the exercises helpful and are continuing to work on asking for help when you need it, and avoiding rushing.  I will call you within the next month to either complete or schedule our next Aging Gracefully RN visit.   If strategy does not work the first time, try it again.     We may make some changes over the next few sessions.      Caryl Pina, RN, BSN, [employer] [facility] Care Management  [phone]

## 2019-11-01 ENCOUNTER — Other Ambulatory Visit: Payer: Self-pay | Admitting: Internal Medicine

## 2019-11-01 DIAGNOSIS — N183 Chronic kidney disease, stage 3 unspecified: Secondary | ICD-10-CM | POA: Diagnosis not present

## 2019-11-01 DIAGNOSIS — M1712 Unilateral primary osteoarthritis, left knee: Secondary | ICD-10-CM | POA: Diagnosis not present

## 2019-11-01 DIAGNOSIS — Z96651 Presence of right artificial knee joint: Secondary | ICD-10-CM | POA: Diagnosis not present

## 2019-11-01 DIAGNOSIS — I129 Hypertensive chronic kidney disease with stage 1 through stage 4 chronic kidney disease, or unspecified chronic kidney disease: Secondary | ICD-10-CM | POA: Diagnosis not present

## 2019-11-01 DIAGNOSIS — Z9181 History of falling: Secondary | ICD-10-CM | POA: Diagnosis not present

## 2019-11-01 DIAGNOSIS — Z7982 Long term (current) use of aspirin: Secondary | ICD-10-CM | POA: Diagnosis not present

## 2019-11-01 DIAGNOSIS — E1122 Type 2 diabetes mellitus with diabetic chronic kidney disease: Secondary | ICD-10-CM | POA: Diagnosis not present

## 2019-11-02 ENCOUNTER — Ambulatory Visit: Payer: Medicare Other

## 2019-11-02 DIAGNOSIS — N183 Chronic kidney disease, stage 3 unspecified: Secondary | ICD-10-CM | POA: Diagnosis not present

## 2019-11-02 DIAGNOSIS — I129 Hypertensive chronic kidney disease with stage 1 through stage 4 chronic kidney disease, or unspecified chronic kidney disease: Secondary | ICD-10-CM | POA: Diagnosis not present

## 2019-11-02 DIAGNOSIS — M1712 Unilateral primary osteoarthritis, left knee: Secondary | ICD-10-CM | POA: Diagnosis not present

## 2019-11-02 DIAGNOSIS — Z7982 Long term (current) use of aspirin: Secondary | ICD-10-CM | POA: Diagnosis not present

## 2019-11-02 DIAGNOSIS — E1122 Type 2 diabetes mellitus with diabetic chronic kidney disease: Secondary | ICD-10-CM | POA: Diagnosis not present

## 2019-11-02 DIAGNOSIS — Z9181 History of falling: Secondary | ICD-10-CM | POA: Diagnosis not present

## 2019-11-02 DIAGNOSIS — Z96651 Presence of right artificial knee joint: Secondary | ICD-10-CM | POA: Diagnosis not present

## 2019-11-02 NOTE — Chronic Care Management (AMB) (Signed)
Chronic Care Management    Social Work Follow Up Note  11/02/2019 Name: Mary [name] MRN: [id number] DOB: [birth year]  Mary [name] is a 74 y.o. year old female who is a primary care patient of Mary Chard, MD. The CCM team was consulted for assistance with care coordination.   Review of patient status, including review of consultants reports, other relevant assessments, and collaboration with appropriate care team members and the patient's provider was performed as part of comprehensive patient evaluation and provision of chronic care management services.    SDOH (Social Determinants of Health) assessments performed: No    Outpatient Encounter Medications as of 11/02/2019  Medication Sig Note  . Assure Comfort Lancets 28G MISC    . Blood Glucose Monitoring Suppl (ONETOUCH VERIO FLEX SYSTEM) w/Device KIT Use as directed to check blood sugars 2 times per day dx: e11.65   . cetirizine (ZYRTEC) 10 MG tablet TAKE 1 TABLET BY MOUTH EVERY DAY   . dorzolamide-timolol (COSOPT) 22.3-6.8 MG/ML ophthalmic solution Place 1 drop into the right eye 2 (two) times daily.   . fluorometholone (FML) 0.1 % ophthalmic suspension Place 1 drop into the right eye 3 (three) times daily.    . fluticasone (FLONASE) 50 MCG/ACT nasal spray SPRAY 1 SPRAY INTO EACH NOSTRIL EVERY DAY   . furosemide (LASIX) 40 MG tablet Take 1 tablet (40 mg total) by mouth daily.   [name] glucose blood (ONETOUCH VERIO) test strip Use as directed to check blood sugars 2 times per day dx: e11.65   . HYDROcodone-acetaminophen (NORCO) 10-325 MG tablet Take 1 tablet by mouth 5 (five) times daily as needed.   [name] KLOR-CON M20 20 MEQ tablet Take 20 mEq by mouth daily. with food 02/23/2018: Patient instructed to hold this medication due to abnormal Potassium level of 6.2 on 02/18/18, per MD, Dr. Gl[place] [name]. Patient was given verbal education related to complications from hypo and hyperkalemia and importance of following MD recommendations.  Patient instructed to resume this medication when directed by PCP. Patient verbalizes understanding.   [name] levothyroxine (SYNTHROID) 125 MCG tablet Take 125 mcg by mouth daily before breakfast.   . loratadine (CLARITIN) 10 MG tablet Take 10 mg by mouth daily.    . magnesium oxide (MAG-OX) 400 MG tablet TAKE 1 TABLET BY MOUTH EVERY EVENING   . metoprolol succinate (TOPROL-XL) 50 MG 24 hr tablet    . metoprolol tartrate (LOPRESSOR) 50 MG tablet Take 50 mg by mouth 2 (two) times daily.    [name] omeprazole (PRILOSEC) 20 MG capsule TAKE 1 CAPSULE BY MOUTH  DAILY   . OneTouch Delica Lancets 38B MISC Use as directed to check blood sugars 2 times per day dx: e11.65   . pravastatin (PRAVACHOL) 40 MG tablet TAKE 1 TABLET BY MOUTH EVERY DAY   . prednisoLONE acetate (PRED FORTE) 1 % ophthalmic suspension Place 1 drop into the right eye 3 (three) times daily.    . predniSONE (DELTASONE) 10 MG tablet Take by mouth.    . telmisartan (MICARDIS) 80 MG tablet TAKE 1 TABLET BY MOUTH  DAILY   . triamcinolone cream (KENALOG) 0.1 % APPLY TO AFFECTED AREA TWICE A DAY    No facility-administered encounter medications on file as of 11/02/2019.     Goals Addressed            This Visit's Progress   . Collaborate with RN Care Manager to perform appropriate assessments to assist with care coordination needs   On track  CARE PLAN ENTRY (see longitudinal plan of care for additional care plan information)  Current Barriers:  . Transportation . Limited access to caregiver . Inability to perform ADL's independently . Inability to perform IADL's independently . Chronic conditions including DM II, CKD III, and parenchymal renal HTN which increase patient risk for hospitalization  Social Work Clinical Goal(s):  [name] Over the next 60 days the patient will work with SW to identify transportation resources . Over the next 120 days the patient will work with SW to address ongoing care coordination needs  CCM SW  Interventions: Completed 11/02/19 with the patient . Successful outbound call placed to the patient to assist with goal progression . Discussed SW submitted [employer] application on 89/3/81 to request patient be approved to access transportation resource . Determined the patient has yet to receive communication regarding eligibility determination o Patient reports she has an [place] appointment on 11/12/19 which she will need transportation assistance for . Collaboration with Mary [name] to request update on patients application status o Determined Mrs Mary [name] has yet to process the patients application but feels she will have this completed in time for patients 10/29/ appointment . Successful outbound call placed  to the patient to advise of application status . Scheduled follow up call to the patient over the next week  Completed 10/26/19 with daughter Mary [name] . Communication with patient daughter, Mary [name], who requests SW assistance with obtaining a handicap placard to be used when transporting the patient . Advised Mary [name] paperwork must be filled out by the patients provider . Collaboration with Mary [name], CMA to request assistance with handicap placard paperwork  Completed 10/20/19 . Collaboration with Mary [name] with [employer] (SCAT) who reports the patients eligibility expired in 2019 . Successful completion of Part A of [employer] application . Collaboration with Dr Mary [name] to request Part B of SC[employer] application be completed on the patients behalf . SW to continue to follow  Completed 10/19/19 . Inter-disciplinary care team collaboration (see longitudinal plan of care) . Collaboration with RN Care Manager who indicated patient interest in transportation resources . Successful outbound call placed to the patient to assist with resource needs o Patient reports she has generally used health plan transportation services but she has run out of rides for the remainder of the plan  year . Discussed opportunity to apply for [facility] Access ([facility]AT) o Patient reports she does not particularly like [employer] but is willing to try it again . Determined the patient has used this resource in the past but is unsure if she is still considered an approved rider . Collaboration with Mary [name] with [facility] Access to determine patient eligibility . Advised the patient SW will assist with a new application as needed pending response from Mrs Mary [name] . Scheduled follow up over the next week  Patient Self Care Activities:  . Patient verbalizes understanding of plan to work with SW to address care coordination needs . Self administers medications as prescribed . Calls pharmacy for medication refills . Calls provider office for new concerns or questions . Unable to perform ADLs independently . Unable to perform IADLs independently  Please see past updates related to this goal by clicking on the "Past Updates" button in the selected goal          Follow Up Plan: SW will follow up with patient by phone over the next week.   [name], BSW, CDP Social Worker, Certified Dementia Practitioner [place] [facility] [phone]  Total time spent  performing care coordination and/or care management activities with the patient by phone or face to face = 15 minutes.

## 2019-11-02 NOTE — Patient Instructions (Signed)
Social Worker Visit Information  Goals we discussed today:  Goals Addressed            This Visit's Progress   . Collaborate with RN Care Manager to perform appropriate assessments to assist with care coordination needs   On track    CARE PLAN ENTRY (see longitudinal plan of care for additional care plan information)  Current Barriers:  . Transportation . Limited access to caregiver . Inability to perform ADL's independently . Inability to perform IADL's independently . Chronic conditions including DM II, CKD III, and parenchymal renal HTN which increase patient risk for hospitalization  Social Work Clinical Goal(s):  [name] Over the next 60 days the patient will work with SW to identify transportation resources . Over the next 120 days the patient will work with SW to address ongoing care coordination needs  CCM SW Interventions: Completed 11/02/19 with the patient . Successful outbound call placed to the patient to assist with goal progression . Discussed SW submitted [employer] application on 10/21/19 to request patient be approved to access transportation resource . Determined the patient has yet to receive communication regarding eligibility determination o Patient reports she has an [place] appointment on 11/12/19 which she will need transportation assistance for . Collaboration with Mary [name] to request update on patients application status o Determined Mrs Mary [name] has yet to process the patients application but feels she will have this completed in time for patients 10/29/ appointment . Successful outbound call placed  to the patient to advise of application status . Scheduled follow up call to the patient over the next week  Completed 10/26/19 with daughter Mary [name] . Communication with patient daughter, Mary [name], who requests SW assistance with obtaining a handicap placard to be used when transporting the patient . Advised Mary [name] paperwork must be filled out by the patients  provider . Collaboration with Mary [name], CMA to request assistance with handicap placard paperwork  Completed 10/20/19 . Collaboration with Mary [name] with [employer] (SCAT) who reports the patients eligibility expired in 2019 . Successful completion of Part A of [employer] application . Collaboration with Dr Allyne Gee to request Part B of SC[employer] application be completed on the patients behalf . SW to continue to follow  Completed 10/19/19 . Inter-disciplinary care team collaboration (see longitudinal plan of care) . Collaboration with RN Care Manager who indicated patient interest in transportation resources . Successful outbound call placed to the patient to assist with resource needs o Patient reports she has generally used health plan transportation services but she has run out of rides for the remainder of the plan year . Discussed opportunity to apply for [facility] Access (SCAT) o Patient reports she does not particularly like [employer] but is willing to try it again . Determined the patient has used this resource in the past but is unsure if she is still considered an approved rider . Collaboration with Mary [name] with [facility] Access to determine patient eligibility . Advised the patient SW will assist with a new application as needed pending response from Mrs Mary [name] . Scheduled follow up over the next week  Patient Self Care Activities:  . Patient verbalizes understanding of plan to work with SW to address care coordination needs . Self administers medications as prescribed . Calls pharmacy for medication refills . Calls provider office for new concerns or questions . Unable to perform ADLs independently . Unable to perform IADLs independently  Please see past updates related to this goal by clicking on the "Past Updates"  button in the selected goal          Follow Up Plan: SW will follow up with patient by phone over the next week.   Mary [name], BSW,  CDP Social Worker, Certified Dementia Practitioner [facility] [phone]

## 2019-11-04 DIAGNOSIS — M1712 Unilateral primary osteoarthritis, left knee: Secondary | ICD-10-CM | POA: Diagnosis not present

## 2019-11-04 DIAGNOSIS — I129 Hypertensive chronic kidney disease with stage 1 through stage 4 chronic kidney disease, or unspecified chronic kidney disease: Secondary | ICD-10-CM | POA: Diagnosis not present

## 2019-11-04 DIAGNOSIS — Z7982 Long term (current) use of aspirin: Secondary | ICD-10-CM | POA: Diagnosis not present

## 2019-11-04 DIAGNOSIS — Z96651 Presence of right artificial knee joint: Secondary | ICD-10-CM | POA: Diagnosis not present

## 2019-11-04 DIAGNOSIS — E1122 Type 2 diabetes mellitus with diabetic chronic kidney disease: Secondary | ICD-10-CM | POA: Diagnosis not present

## 2019-11-04 DIAGNOSIS — Z9181 History of falling: Secondary | ICD-10-CM | POA: Diagnosis not present

## 2019-11-04 DIAGNOSIS — N183 Chronic kidney disease, stage 3 unspecified: Secondary | ICD-10-CM | POA: Diagnosis not present

## 2019-11-08 NOTE — Progress Notes (Signed)
[facility] Note  11/12/2019     CHIEF COMPLAINT Patient presents for Retina Follow Up   HISTORY OF PRESENT ILLNESS: Mary [name] is a 74 y.o. female who presents to the clinic today for:   HPI    Retina Follow Up    Patient presents with  CRVO/BRVO.  In both eyes.  This started months ago.  Severity is moderate.  Duration of 6.5 weeks.  Since onset it is stable.  I, the attending physician,  performed the HPI with the patient and updated documentation appropriately.          Comments    74 y/o female pt here for 6.5 wk f/u for CRVO w/mac edema OU.  No change in [place] OU.  Denies pain, FOL, floaters.  Timolol BID OD.  BS 120 2 wks ago.  A1C 5.4 2-3 mos ago.       Last edited by Bernarda Caffey, MD on 11/12/2019 11:02 AM. (History)    pt states no change in vision, she denies floaters   Referring physician: Glendale Chard, [address] [place],  [place] [address]  HISTORICAL INFORMATION:   Selected notes from the [facility] Transferred care from Dr. Zigmund Daniel   CURRENT MEDICATIONS: Current Outpatient Medications (Ophthalmic Drugs)  Medication Sig  . dorzolamide-timolol (COSOPT) 22.3-6.8 MG/ML ophthalmic solution Place 1 drop into the right eye 2 (two) times daily.  . fluorometholone (FML) 0.1 % ophthalmic suspension Place 1 drop into the right eye 3 (three) times daily.   . prednisoLONE acetate (PRED FORTE) 1 % ophthalmic suspension Place 1 drop into the right eye 3 (three) times daily.    No current facility-administered medications for this visit. (Ophthalmic Drugs)   Current Outpatient Medications (Other)  Medication Sig  . Assure Comfort Lancets 28G MISC   . Blood Glucose Monitoring Suppl (ONETOUCH VERIO FLEX SYSTEM) w/Device KIT Use as directed to check blood sugars 2 times per day dx: e11.65  . cetirizine (ZYRTEC) 10 MG tablet TAKE 1 TABLET BY MOUTH EVERY DAY  . fluticasone (FLONASE) 50 MCG/ACT nasal spray  SPRAY 1 SPRAY INTO EACH NOSTRIL EVERY DAY  . furosemide (LASIX) 40 MG tablet Take 1 tablet (40 mg total) by mouth daily.  [name] glucose blood (ONETOUCH VERIO) test strip Use as directed to check blood sugars 2 times per day dx: e11.65  . HYDROcodone-acetaminophen (NORCO) 10-325 MG tablet Take 1 tablet by mouth 5 (five) times daily as needed.  [name] KLOR-CON M20 20 MEQ tablet Take 20 mEq by mouth daily. with food  . levothyroxine (SYNTHROID) 125 MCG tablet Take 125 mcg by mouth daily before breakfast.  . loratadine (CLARITIN) 10 MG tablet Take 10 mg by mouth daily.   . magnesium oxide (MAG-OX) 400 MG tablet TAKE 1 TABLET BY MOUTH EVERY EVENING  . metoprolol succinate (TOPROL-XL) 50 MG 24 hr tablet   . metoprolol tartrate (LOPRESSOR) 50 MG tablet Take 50 mg by mouth 2 (two) times daily.   [name] omeprazole (PRILOSEC) 20 MG capsule TAKE 1 CAPSULE BY MOUTH  DAILY  . OneTouch Delica Lancets 44Y MISC Use as directed to check blood sugars 2 times per day dx: e11.65  . pravastatin (PRAVACHOL) 40 MG tablet TAKE 1 TABLET BY MOUTH EVERY DAY  . predniSONE (DELTASONE) 10 MG tablet Take by mouth.   . telmisartan (MICARDIS) 80 MG tablet TAKE 1 TABLET BY MOUTH  DAILY  . triamcinolone cream (KENALOG) 0.1 % APPLY TO AFFECTED  AREA TWICE A DAY   No current facility-administered medications for this visit. (Other)      REVIEW OF SYSTEMS: ROS    Positive for: Musculoskeletal, Endocrine, Eyes   Negative for: Constitutional, Gastrointestinal, Neurological, Skin, Genitourinary, HENT, Cardiovascular, Respiratory, Psychiatric, Allergic/Imm, Heme/Lymph   Last edited by Matthew Folks, COA on 11/12/2019  9:15 AM. (History)       ALLERGIES No Known Allergies  PAST MEDICAL HISTORY Past Medical History:  Diagnosis Date  . Arthritis    osteoarthritis  . Cataract    OS  . Diabetes mellitus    Insulin dependent  . Diabetic retinopathy ([place])    NPDR OU  . Glaucoma    POAG OD  . H/O cardiovascular stress test    pt.  reports 10/13- was normal per pt.   . Hyperlipemia   . Hypertension   . Hypertensive retinopathy    OU   Past Surgical History:  Procedure Laterality Date  . CARPAL TUNNEL RELEASE Bilateral   . CATARACT EXTRACTION Right 10/05/2018   Dr. Kathlen Mody  . EYE SURGERY Right    Cat Sx  . KNEE ARTHROSCOPY     right  . QUADRICEPS TENDON REPAIR  02/17/2012   Procedure: REPAIR QUADRICEP TENDON;  Surgeon: Vickey Huger, MD;  Location: [facility];  Service: Orthopedics;  Laterality: Right;  right quadriceps repair with latera release  . QUADRICEPS TENDON REPAIR Right 05/15/2012   Procedure: REPAIR QUADRICEP TENDON;  Surgeon: Vickey Huger, MD;  Location: [facility];  Service: Orthopedics;  Laterality: Right;  . TOTAL KNEE ARTHROPLASTY  10/21/2011   rt tk  . TOTAL KNEE ARTHROPLASTY  10/21/2011   Procedure: TOTAL KNEE ARTHROPLASTY;  Surgeon: Rudean Haskell, MD;  Location: [facility];  Service: Orthopedics;  Laterality: Right;  . TUBAL LIGATION      FAMILY HISTORY Family History  Problem Relation Age of Onset  . Hypertension Mother   . Heart Problems Father   . Gout Father   . Arthritis Father     SOCIAL HISTORY Social History   Tobacco Use  . Smoking status: Former Smoker    Packs/day: 0.25    Years: 20.00    Pack years: 5.00    Types: Cigarettes    Quit date: 06/28/2014    Years since quitting: 5.3  . Smokeless tobacco: Never Used  Vaping Use  . Vaping Use: Never used  Substance Use Topics  . Alcohol use: No  . Drug use: Yes    Types: Hydrocodone         OPHTHALMIC EXAM:  Base Eye Exam    Visual Acuity (Snellen - Linear)      Right Left   Dist cc 20/80 -2 HM   Dist ph cc NI NI   Correction: Glasses       Tonometry (Tonopen, 9:21 AM)      Right Left   Pressure 14 15       Pupils      Dark Light Shape React APD   Right 2 1 Round Minimal None   Left 2 1 Round Minimal None       Visual Fields (Counting fingers)      Left Right     Full   Restrictions Total superior temporal,  inferior temporal, superior nasal, inferior nasal deficiencies        Extraocular Movement      Right Left    Full Full       Neuro/Psych    Oriented x3:  Yes   Mood/Affect: Normal       Dilation    Both eyes: 1.0% Mydriacyl, 2.5% Phenylephrine @ 9:21 AM        Slit Lamp and Fundus Exam    Slit Lamp Exam      Right Left   Lids/Lashes Dermatochalasis - upper lid, mild Meibomian gland dysfunction Dermatochalasis - upper lid, mild Meibomian gland dysfunction   Conjunctiva/Sclera Melanosis Melanosis   Cornea 2-3+ Punctate epithelial erosions, well healed temporal cataract wounds 3+ Punctate epithelial erosions, well healed temporal cataract wounds   Anterior Chamber Deep and quiet Deep and quiet   Iris Round and moderately dilated to 5.5m Round and moderately dilated to 634m  Lens PC IOL in good position, trace, cortical remnant temporally, PC folds 2-3+ Nuclear sclerosis, 2-3+ Cortical cataract   Vitreous Vitreous syneresis, vitreous condensations, Ozurdex pellet remnants inferiorly, no residual IVT Vitreous syneresis       Fundus Exam      Right Left   Disc  2-3+Pallor, Sharp rim, temporal PPP Mild Pallor, Sharp rim, PPP   C/D Ratio 0.5 0.3   Macula Blunted foveal reflex, [place], Retinal pigment epithelial mottling, temporal edema with focal exudates-- slightly improved Flat, Blunted foveal reflex, RPE mottling and clumping, scarring, atrophy, no heme   Vessels Vascular attenuation, Tortuous Severe Vascular attenuation, +fibrosis   Periphery Attached    Attached, pavingstone and reticular degeneration inferiorly, scattered RPE atrophy          IMAGING AND PROCEDURES  Imaging and Procedures for _0 @  OCT, Retina - OU - Both Eyes       Right Eye Quality was good. Central Foveal Thickness: 263. Progression has improved. Findings include abnormal foveal contour, intraretinal fluid, intraretinal hyper-reflective material, no SRF, vitreomacular adhesion  (Mild interval  decrease in IRF ST macula).   Left Eye Quality was good. Central Foveal Thickness: 216. Progression has been stable. Findings include abnormal foveal contour, no IRF, no SRF, outer retinal atrophy, epiretinal membrane, preretinal fibrosis, subretinal hyper-reflective material, pigment epithelial detachment (Stable from prior).   Notes *Images captured and stored on drive  Diagnosis / Impression:  OD: CRVO w/ mild interval decrease in IRF ST macula OS: diffuse ORA - Stable from prior  Clinical management:  See below  Abbreviations: NFP - Normal foveal profile. CME - cystoid macular edema. PED - pigment epithelial detachment. IRF - intraretinal fluid. SRF - subretinal fluid. EZ - ellipsoid zone. ERM - epiretinal membrane. ORA - outer retinal atrophy. ORT - outer retinal tubulation. SRHM - subretinal hyper-reflective material        Intravitreal Injection, Pharmacologic Agent - OD - Right Eye       Time Out 11/12/2019. 10:01 AM. Confirmed correct patient, procedure, site, and patient consented.   Anesthesia Topical anesthesia was used. Anesthetic medications included Lidocaine 2%, Proparacaine 0.5%.   Procedure Preparation included 5% betadine to ocular surface, eyelid speculum. A 27 gauge needle was used.   Injection:  4 mg Triescence 4046mlL injection   NDC: 006718-186-0433ot: 1OE2N, Expiration date: 10/13/2020   Route: Intravitreal, Site: Right Eye, Waste: 0.9 mL  Post-op Post injection exam found visual acuity of at least counting fingers. The patient tolerated the procedure well. There were no complications. The patient received written and verbal post procedure care education.   Notes An AC tap was performed following injection due to elevated IOP using a 30 gauge needle on a syringe with the plunger removed. The needle was placed at the limbus at  5 oclock and approximately 0.05 cc of aqueous was removed from the anterior chamber. Betadine was applied to the tap area  before and after the paracentesis was performed. There were no complications. The patient tolerated the procedure well. The IOP was rechecked and was found to be ~10 mmHg by digital palpation.                 ASSESSMENT/PLAN:    ICD-10-CM   1. Central retinal vein occlusion with macular edema of both eyes  H34.8130 Intravitreal Injection, Pharmacologic Agent - OD - Right Eye    triamcinolone acetonide (TRIESENCE) 40 MG/ML subtenons injection 4 mg  2. Moderate nonproliferative diabetic retinopathy of both eyes with macular edema associated with type 2 diabetes mellitus ([place])  V78.5885   3. Retinal edema  H35.81 OCT, Retina - OU - Both Eyes  4. Essential hypertension  I10   5. Hypertensive retinopathy of both eyes  H35.033   6. Combined forms of age-related cataract of left eye  H25.812   7. Pseudophakia  Z96.1   8. Primary open angle glaucoma (POAG) of right eye, mild stage  H40.1111    1. CRVO w/ CME OU  - pt of Dr. Zigmund Daniel who wished to transfer care  - OS long-standing low vision (HM) with subfoveal scar and central retinal atrophy  - OD with CME actively being managed with Ozurdex OD ~q5 wks by Dr. Zigmund Daniel (last on 8.20.20) s/p multiple IVTA and Ozurdex OD  - was due for Ozurdex OD w/ Zigmund Daniel on 9.25.20, but underwent cataract surgery OD w/ Kathlen Mody on 9.21.20  - s/p IVTA OD #4 (10.26.20), #5 (12.10.20), #6 (01.21.21), #7 (03.05.21), #8 (04.16.21), #9 (05.28.21), #10 (07.09.21), #11 (09.14.21)  - BCVA stable at 20/80 OD  - exam shows no residual IVTA in vitreous cavity  - OCT shows interval improvement in IRF ST macula and fovea  - recommend IVTA OD #12 today, 10.29.21  - pt wishes to proceed  - RBA of procedure discussed, questions answered  - informed consent obtained  - see procedure note -- AC tap  - f/u in 6 wks -- DFE/OCT/possible injection  2,3. Severe Non-proliferative diabetic retinopathy OU.  - OCT shows diabetic macular edema, OD   - recommend IVTA OD today,  09.14.21 as above for CRVO  4,5. Hypertensive retinopathy OU  - discussed importance of tight BP control  - monitor  6. Mixed form age related cataract OS  - under the expert management of Dr. Kathlen Mody  7. Pseudophakia OD  - s/p CE/IOL OD (9.21.20) by expert surgeon, Dr. Kathlen Mody  - IOL in excellent position  - had post op IOP spike -- IOP now under better control  - monitor  8. POAG OD  - under the expert management of Dr. Kathlen Mody  - s/p SLT and goniotomy OD  - had post-cataract IOP spike  - IOP 14 today  - AC tap post IVTA injxn  Ophthalmic Meds Ordered this visit:  Meds ordered this encounter  Medications  . triamcinolone acetonide (TRIESENCE) 40 MG/ML subtenons injection 4 mg       Return in about 6 weeks (around 12/24/2019) for f/u CRVO OU, DFE, OCT.  There are no Patient Instructions on file for this visit.   Explained the diagnoses, plan, and follow up with the patient and they expressed understanding.  Patient expressed understanding of the importance of proper follow up care.   This document serves as a record of services personally performed by Sharyon Cable.  Coralyn Pear, MD, PhD. It was created on their behalf by San Jetty. Owens Shark, OA an ophthalmic technician. The creation of this record is the provider's dictation and/or activities during the visit.    Electronically signed by: San Jetty. [place], [place] 10.25.2021 11:05 AM  Gardiner Sleeper, M.D., Ph.D. Diseases & Surgery of the Retina and Vitreous [facility]  I have reviewed the above documentation for accuracy and completeness, and I agree with the above. Gardiner Sleeper, M.D., Ph.D. 11/12/19 11:05 AM   Abbreviations: M myopia (nearsighted); A astigmatism; H hyperopia (farsighted); P presbyopia; Mrx spectacle prescription;  CTL contact lenses; OD right eye; OS left eye; OU both eyes  XT exotropia; ET esotropia; PEK punctate epithelial keratitis; PEE punctate epithelial erosions; DES dry eye syndrome; MGD  meibomian gland dysfunction; ATs artificial tears; PFAT's preservative free artificial tears; [place] nuclear sclerotic cataract; PSC posterior subcapsular cataract; ERM epi-retinal membrane; PVD posterior vitreous detachment; RD retinal detachment; DM diabetes mellitus; DR diabetic retinopathy; NPDR non-proliferative diabetic retinopathy; PDR proliferative diabetic retinopathy; CSME clinically significant macular edema; DME diabetic macular edema; dbh dot blot hemorrhages; CWS cotton wool spot; POAG primary open angle glaucoma; C/D cup-to-disc ratio; HVF humphrey visual field; GVF goldmann visual field; OCT optical coherence tomography; IOP intraocular pressure; BRVO Branch retinal vein occlusion; CRVO central retinal vein occlusion; CRAO central retinal artery occlusion; BRAO branch retinal artery occlusion; RT retinal tear; SB scleral buckle; PPV pars plana vitrectomy; VH Vitreous hemorrhage; PRP panretinal laser photocoagulation; IVK intravitreal kenalog; VMT vitreomacular traction; MH Macular hole;  NVD neovascularization of the disc; NVE neovascularization elsewhere; AREDS age related eye disease study; ARMD age related macular degeneration; POAG primary open angle glaucoma; EBMD epithelial/anterior basement membrane dystrophy; ACIOL anterior chamber intraocular lens; IOL intraocular lens; PCIOL posterior chamber intraocular lens; Phaco/IOL phacoemulsification with intraocular lens placement; [place] photorefractive keratectomy; LASIK laser assisted in situ keratomileusis; HTN hypertension; DM diabetes mellitus; COPD chronic obstructive pulmonary disease

## 2019-11-09 ENCOUNTER — Ambulatory Visit: Payer: Medicare Other

## 2019-11-09 DIAGNOSIS — E1122 Type 2 diabetes mellitus with diabetic chronic kidney disease: Secondary | ICD-10-CM | POA: Diagnosis not present

## 2019-11-09 DIAGNOSIS — N183 Chronic kidney disease, stage 3 unspecified: Secondary | ICD-10-CM

## 2019-11-09 DIAGNOSIS — Z7982 Long term (current) use of aspirin: Secondary | ICD-10-CM | POA: Diagnosis not present

## 2019-11-09 DIAGNOSIS — Z96651 Presence of right artificial knee joint: Secondary | ICD-10-CM | POA: Diagnosis not present

## 2019-11-09 DIAGNOSIS — I129 Hypertensive chronic kidney disease with stage 1 through stage 4 chronic kidney disease, or unspecified chronic kidney disease: Secondary | ICD-10-CM | POA: Diagnosis not present

## 2019-11-09 DIAGNOSIS — Z9181 History of falling: Secondary | ICD-10-CM | POA: Diagnosis not present

## 2019-11-09 DIAGNOSIS — M1712 Unilateral primary osteoarthritis, left knee: Secondary | ICD-10-CM | POA: Diagnosis not present

## 2019-11-09 NOTE — Patient Instructions (Signed)
Social Worker Visit Information  Goals we discussed today:  Goals Addressed            This Visit's Progress   . Collaborate with RN Care Manager to perform appropriate assessments to assist with care coordination needs   On track    CARE PLAN ENTRY (see longitudinal plan of care for additional care plan information)  Current Barriers:  . Transportation . Limited access to caregiver . Inability to perform ADL's independently . Inability to perform IADL's independently . Chronic conditions including DM II, CKD III, and parenchymal renal HTN which increase patient risk for hospitalization  Social Work Clinical Goal(s):  [name] Over the next 60 days the patient will work with SW to identify transportation resources . Over the next 120 days the patient will work with SW to address ongoing care coordination needs  CCM SW Interventions: Completed 11/09/19 with the patient . Collaboration with Lorretta Harp to assess outcome of patient transportation application to access [employer] (formerly [employer]) o Determined [name] has yet to process application but plans to process the application on 10/27 . Successful outbound call placed to the patient to discuss plan to follow up with [name] on 10/27 to determine patient application status . Determined the patient has contacted [employer] (whom she has been active with in the past) and secured transportation for 11/29 appointment . Advised the patient SW will follow up later this week to confirm application approval/denial for [facility] Access  Completed 11/02/19 with the patient . Successful outbound call placed to the patient to assist with goal progression . Discussed SW submitted [employer] application on 10/21/19 to request patient be approved to access transportation resource . Determined the patient has yet to receive communication regarding eligibility determination o Patient reports she has an upcomming appointment on 11/12/19  which she will need transportation assistance for . Collaboration with Lorretta Harp to request update on patients application status o Determined Mrs Suzan Nailer has yet to process the patients application but feels she will have this completed in time for patients 10/29/ appointment . Successful outbound call placed  to the patient to advise of application status . Scheduled follow up call to the patient over the next week  Completed 10/26/19 with daughter [name] . Communication with patient daughter, [name], who requests SW assistance with obtaining a handicap placard to be used when transporting the patient . Advised [name] paperwork must be filled out by the patients provider . Collaboration with Mariam Dollar, CMA to request assistance with handicap placard paperwork  Completed 10/20/19 . Collaboration with Lorretta Harp with [employer] ([employer]) who reports the patients eligibility expired in 2019 . Successful completion of Part A of [employer] application . Collaboration with Dr Allyne Gee to request Part B of [employer] application be completed on the patients behalf . SW to continue to follow  Completed 10/19/19 . Inter-disciplinary care team collaboration (see longitudinal plan of care) . Collaboration with RN Care Manager who indicated patient interest in transportation resources . Successful outbound call placed to the patient to assist with resource needs o Patient reports she has generally used health plan transportation services but she has run out of rides for the remainder of the plan year . Discussed opportunity to apply for [facility] Access ([employer]) o Patient reports she does not particularly like [employer] but is willing to try it again . Determined the patient has used this resource in the past but is unsure if she is still considered an approved rider .  Collaboration with Lorretta Harp with [facility] Access to determine patient eligibility . Advised the patient SW will assist with a  new application as needed pending response from Mrs Suzan Nailer . Scheduled follow up over the next week  Patient Self Care Activities:  . Patient verbalizes understanding of plan to work with SW to address care coordination needs . Self administers medications as prescribed . Calls pharmacy for medication refills . Calls provider office for new concerns or questions . Unable to perform ADLs independently . Unable to perform IADLs independently  Please see past updates related to this goal by clicking on the "Past Updates" button in the selected goal           Follow Up Plan: SW will follow up with patient by phone over the next 7 days.   Bevelyn Ngo, BSW, CDP Social Worker, Certified Dementia Practitioner [facility] Care Management [phone]

## 2019-11-09 NOTE — Chronic Care Management (AMB) (Signed)
Chronic Care Management    Social Work Follow Up Note  11/09/2019 Name: Mary [name] MRN: [id number] DOB: [birth year]  Mary [name] is a 74 y.o. year old female who is a primary care patient of Mary Chard, MD. The CCM team was consulted for assistance with care coordination.   Review of patient status, including review of consultants reports, other relevant assessments, and collaboration with appropriate care team members and the patient's provider was performed as part of comprehensive patient evaluation and provision of chronic care management services.    SDOH (Social Determinants of Health) assessments performed: No    Outpatient Encounter Medications as of 11/09/2019  Medication Sig Note   Assure Comfort Lancets 28G MISC     Blood Glucose Monitoring Suppl (ONETOUCH VERIO FLEX SYSTEM) w/Device KIT Use as directed to check blood sugars 2 times per day dx: e11.65    cetirizine (ZYRTEC) 10 MG tablet TAKE 1 TABLET BY MOUTH EVERY DAY    dorzolamide-timolol (COSOPT) 22.3-6.8 MG/ML ophthalmic solution Place 1 drop into the right eye 2 (two) times daily.    fluorometholone (FML) 0.1 % ophthalmic suspension Place 1 drop into the right eye 3 (three) times daily.     fluticasone (FLONASE) 50 MCG/ACT nasal spray SPRAY 1 SPRAY INTO EACH NOSTRIL EVERY DAY    furosemide (LASIX) 40 MG tablet Take 1 tablet (40 mg total) by mouth daily.    glucose blood (ONETOUCH VERIO) test strip Use as directed to check blood sugars 2 times per day dx: e11.65    HYDROcodone-acetaminophen (NORCO) 10-325 MG tablet Take 1 tablet by mouth 5 (five) times daily as needed.    KLOR-CON M20 20 MEQ tablet Take 20 mEq by mouth daily. with food 02/23/2018: Patient instructed to hold this medication due to abnormal Potassium level of 6.2 on 02/18/18, per MD, Mary. [place]end[place] [name]. Patient was given verbal education related to complications from hypo and hyperkalemia and importance of following MD recommendations.  Patient instructed to resume this medication when directed by PCP. Patient verbalizes understanding.    levothyroxine (SYNTHROID) 125 MCG tablet Take 125 mcg by mouth daily before breakfast.    loratadine (CLARITIN) 10 MG tablet Take 10 mg by mouth daily.     magnesium oxide (MAG-OX) 400 MG tablet TAKE 1 TABLET BY MOUTH EVERY EVENING    metoprolol succinate (TOPROL-XL) 50 MG 24 hr tablet     metoprolol tartrate (LOPRESSOR) 50 MG tablet Take 50 mg by mouth 2 (two) times daily.     omeprazole (PRILOSEC) 20 MG capsule TAKE 1 CAPSULE BY MOUTH  DAILY    OneTouch Delica Lancets 02O MISC Use as directed to check blood sugars 2 times per day dx: e11.65    pravastatin (PRAVACHOL) 40 MG tablet TAKE 1 TABLET BY MOUTH EVERY DAY    prednisoLONE acetate (PRED FORTE) 1 % ophthalmic suspension Place 1 drop into the right eye 3 (three) times daily.     predniSONE (DELTASONE) 10 MG tablet Take by mouth.     telmisartan (MICARDIS) 80 MG tablet TAKE 1 TABLET BY MOUTH  DAILY    triamcinolone cream (KENALOG) 0.1 % APPLY TO AFFECTED AREA TWICE A DAY    No facility-administered encounter medications on file as of 11/09/2019.     Goals Addressed            This Visit's Progress    Collaborate with RN Care Manager to perform appropriate assessments to assist with care coordination needs   On track  CARE PLAN ENTRY (see longitudinal plan of care for additional care plan information)  Current Barriers:   Transportation  Limited access to caregiver  Inability to perform ADL's independently  Inability to perform IADL's independently  Chronic conditions including DM II, CKD III, and parenchymal renal HTN which increase patient risk for hospitalization  Social Work Clinical Goal(s):   Over the next 60 days the patient will work with SW to identify transportation resources  Over the next 120 days the patient will work with SW to address ongoing care coordination needs  CCM SW  Interventions: Completed 11/09/19 with the patient  Collaboration with Mary [name] to assess outcome of patient transportation application to access [employer] (formerly [employer]) o Determined Mary [name] has yet to process application but plans to process the application on 95/62  Successful outbound call placed to the patient to discuss plan to follow up with Mary [name] on 10/27 to determine patient application status  Determined the patient has contacted [employer] (whom she has been active with in the past) and secured transportation for 11/29 appointment  Advised the patient SW will follow up later this week to confirm application approval/denial for [facility] Access  Completed 11/02/19 with the patient  Successful outbound call placed to the patient to assist with goal progression  Discussed SW submitted [employer] application on 13/0/86 to request patient be approved to access transportation resource  Determined the patient has yet to receive communication regarding eligibility determination o Patient reports she has an [place] appointment on 11/12/19 which she will need transportation assistance for  Collaboration with Mary [name] to request update on patients application status o Determined Mary [name] has yet to process the patients application but feels she will have this completed in time for patients 10/29/ appointment  Successful outbound call placed  to the patient to advise of application status  Scheduled follow up call to the patient over the next week  Completed 10/26/19 with daughter Mary [name]  Communication with patient daughter, Mary [name], who requests SW assistance with obtaining a handicap placard to be used when transporting the patient  Ree Edman paperwork must be filled out by the patients provider  Collaboration with Mary [name], [place] to request assistance with handicap placard paperwork  Completed 10/20/19  Collaboration with Mary [name] with [facility] Access ([facility]) who reports the patients eligibility expired in 2019  Successful completion of Part A of [employer] application  Collaboration with Mary [name] to request Part B of SCAT application be completed on the patients behalf  SW to continue to follow  Completed 10/19/19  Inter-disciplinary care team collaboration (see longitudinal plan of care)  Collaboration with RN Care Manager who indicated patient interest in transportation resources  Successful outbound call placed to the patient to assist with resource needs o Patient reports she has generally used health plan transportation services but she has run out of rides for the remainder of the plan year  Discussed opportunity to apply for [employer] ([employer]AT) o Patient reports she does not particularly like [employer] but is willing to try it again  Determined the patient has used this resource in the past but is unsure if she is still considered an approved rider  Collaboration with Mary [name] with [place] Access to determine patient eligibility  Advised the patient SW will assist with a new application as needed pending response from Mary [name]  Scheduled follow up over the next week  Patient Self Care Activities:   Patient verbalizes understanding of plan to  work with SW to address care coordination needs  Self administers medications as prescribed  Calls pharmacy for medication refills  Calls provider office for new concerns or questions  Unable to perform ADLs independently  Unable to perform IADLs independently  Please see past updates related to this goal by clicking on the "Past Updates" button in the selected goal          Follow Up Plan: SW will follow up with patient by phone over the next 7 days.   [name], BSW, CDP Social Worker, Certified Dementia Practitioner [place] [facility] [place] [facility] [phone]  Total time spent performing care coordination and/or care management  activities with the patient by phone or face to face = 18 minutes.

## 2019-11-11 ENCOUNTER — Ambulatory Visit: Payer: Medicare Other

## 2019-11-11 DIAGNOSIS — Z7982 Long term (current) use of aspirin: Secondary | ICD-10-CM | POA: Diagnosis not present

## 2019-11-11 DIAGNOSIS — N183 Chronic kidney disease, stage 3 unspecified: Secondary | ICD-10-CM

## 2019-11-11 DIAGNOSIS — Z96651 Presence of right artificial knee joint: Secondary | ICD-10-CM | POA: Diagnosis not present

## 2019-11-11 DIAGNOSIS — E1122 Type 2 diabetes mellitus with diabetic chronic kidney disease: Secondary | ICD-10-CM

## 2019-11-11 DIAGNOSIS — I129 Hypertensive chronic kidney disease with stage 1 through stage 4 chronic kidney disease, or unspecified chronic kidney disease: Secondary | ICD-10-CM | POA: Diagnosis not present

## 2019-11-11 DIAGNOSIS — M1712 Unilateral primary osteoarthritis, left knee: Secondary | ICD-10-CM | POA: Diagnosis not present

## 2019-11-11 DIAGNOSIS — Z9181 History of falling: Secondary | ICD-10-CM | POA: Diagnosis not present

## 2019-11-11 NOTE — Patient Instructions (Signed)
Social Worker Visit Information  Goals we discussed today:  Goals Addressed            This Visit's Progress    Collaborate with RN Care Manager to perform appropriate assessments to assist with care coordination needs   On track    [place] (see longitudinal plan of care for additional care plan information)  Current Barriers:   Transportation  Limited access to caregiver  Inability to perform ADL's independently  Inability to perform IADL's independently  Chronic conditions including DM II, CKD III, and parenchymal renal HTN which increase patient risk for hospitalization  Social Work Clinical Goal(s):   Over the next 60 days the patient will work with SW to identify transportation resources Goal Met  Over the next 120 days the patient will work with SW to address ongoing care coordination needs  CCM SW Interventions: Completed 11/11/19 with the patient  Received communication from [employer] with [employer] ([place]) indicating patient has been certified to access transportation services "Ms. Cropley has been certified. I will mail her certification packet next week, however she does not have to wait to make reservations. The reservations number is: [phone]."  Successful outbound call placed to the patient to inform of certification as well as to expect packet in the mail  Provided the patient with contact number to reserve transportation as needed prior to receiving mailed packet  Scheduled follow up over the next month   Completed 11/09/19 with the patient  Collaboration with [name] to assess outcome of patient transportation application to access [employer] (formerly [employer]) o Determined [name] has yet to process application but plans to process the application on 62/83  Successful outbound call placed to the patient to discuss plan to follow up with [name] on 10/27 to determine patient application status  Determined  the patient has contacted [employer] (whom she has been active with in the past) and secured transportation for 11/29 appointment  Advised the patient SW will follow up later this week to confirm application approval/denial for [facility] Access  Completed 11/02/19 with the patient  Successful outbound call placed to the patient to assist with goal progression  Discussed SW submitted [employer] application on 15/1/76 to request patient be approved to access transportation resource  Determined the patient has yet to receive communication regarding eligibility determination o Patient reports she has an [place] appointment on 11/12/19 which she will need transportation assistance for  Collaboration with [name] to request update on patients application status o Determined [name] has yet to process the patients application but feels she will have this completed in time for patients 10/29/ appointment  Successful outbound call placed  to the patient to advise of application status  Scheduled follow up call to the patient over the next week  Completed 10/26/19 with daughter [name]  Communication with patient daughter, [name], who requests SW assistance with obtaining a handicap placard to be used when transporting the patient  Ree Edman paperwork must be filled out by the patients provider  Collaboration with [name], [place] to request assistance with handicap placard paperwork  Completed 10/20/19  Collaboration with [name] with [employer]) who reports the patients eligibility expired in 2019  Successful completion of Part A of [employer] application  Collaboration with Dr Baird Cancer to request Part B of SCAT application be completed on the patients behalf  SW to continue to follow  Completed 10/19/19  Inter-disciplinary care team collaboration (see longitudinal  plan of care)  Collaboration with RN Care Manager who indicated patient interest in  transportation resources  Successful outbound call placed to the patient to assist with resource needs o Patient reports she has generally used health plan transportation services but she has run out of rides for the remainder of the plan year  Discussed opportunity to apply for [employer] ([facility]AT) o Patient reports she does not particularly like [employer] but is willing to try it again  Determined the patient has used this resource in the past but is unsure if she is still considered an approved rider  Collaboration with [name] with [place] Access to determine patient eligibility  Advised the patient SW will assist with a new application as needed pending response from [name]  Scheduled follow up over the next week  Patient Self Care Activities:   Patient verbalizes understanding of plan to work with SW to address care coordination needs  Self administers medications as prescribed  Calls pharmacy for medication refills  Calls provider office for new concerns or questions  Unable to perform ADLs independently  Unable to perform IADLs independently  Please see past updates related to this goal by clicking on the "Past Updates" button in the selected goal          Materials Provided: Verbal education about SCAT provided by phone  Follow Up Plan: SW will follow up with patient by phone over the next month.   [name], BSW, CDP Social Worker, Certified Dementia Practitioner [place] [facility] [place] [facility] [phone]

## 2019-11-11 NOTE — Chronic Care Management (AMB) (Signed)
Chronic Care Management    Social Work Follow Up Note  11/11/2019 Name: Mary [name] MRN: [id number] DOB: March 19, 1945  Mary [name] is a 74 y.o. year old female who is a primary care patient of Mary Chard, MD. The CCM team was consulted for assistance with care coordination.   Review of patient status, including review of consultants reports, other relevant assessments, and collaboration with appropriate care team members and the patient's provider was performed as part of comprehensive patient evaluation and provision of chronic care management services.    SDOH (Social Determinants of Health) assessments performed: No    Outpatient Encounter Medications as of 11/11/2019  Medication Sig Note  . Assure Comfort Lancets 28G MISC    . Blood Glucose Monitoring Suppl (ONETOUCH VERIO FLEX SYSTEM) w/Device KIT Use as directed to check blood sugars 2 times per day dx: e11.65   . cetirizine (ZYRTEC) 10 MG tablet TAKE 1 TABLET BY MOUTH EVERY DAY   . dorzolamide-timolol (COSOPT) 22.3-6.8 MG/ML ophthalmic solution Place 1 drop into the right eye 2 (two) times daily.   . fluorometholone (FML) 0.1 % ophthalmic suspension Place 1 drop into the right eye 3 (three) times daily.    . fluticasone (FLONASE) 50 MCG/ACT nasal spray SPRAY 1 SPRAY INTO EACH NOSTRIL EVERY DAY   . furosemide (LASIX) 40 MG tablet Take 1 tablet (40 mg total) by mouth daily.   [name] glucose blood (ONETOUCH VERIO) test strip Use as directed to check blood sugars 2 times per day dx: e11.65   . HYDROcodone-acetaminophen (NORCO) 10-325 MG tablet Take 1 tablet by mouth 5 (five) times daily as needed.   [name] KLOR-CON M20 20 MEQ tablet Take 20 mEq by mouth daily. with food 02/23/2018: Patient instructed to hold this medication due to abnormal Potassium level of 6.2 on 02/18/18, per MD, Dr. Glendale [name]. Patient was given verbal education related to complications from hypo and hyperkalemia and importance of following MD recommendations.  Patient instructed to resume this medication when directed by PCP. Patient verbalizes understanding.   [name] levothyroxine (SYNTHROID) 125 MCG tablet Take 125 mcg by mouth daily before breakfast.   . loratadine (CLARITIN) 10 MG tablet Take 10 mg by mouth daily.    . magnesium oxide (MAG-OX) 400 MG tablet TAKE 1 TABLET BY MOUTH EVERY EVENING   . metoprolol succinate (TOPROL-XL) 50 MG 24 hr tablet    . metoprolol tartrate (LOPRESSOR) 50 MG tablet Take 50 mg by mouth 2 (two) times daily.    [name] omeprazole (PRILOSEC) 20 MG capsule TAKE 1 CAPSULE BY MOUTH  DAILY   . OneTouch Delica Lancets 48A MISC Use as directed to check blood sugars 2 times per day dx: e11.65   . pravastatin (PRAVACHOL) 40 MG tablet TAKE 1 TABLET BY MOUTH EVERY DAY   . prednisoLONE acetate (PRED FORTE) 1 % ophthalmic suspension Place 1 drop into the right eye 3 (three) times daily.    . predniSONE (DELTASONE) 10 MG tablet Take by mouth.    . telmisartan (MICARDIS) 80 MG tablet TAKE 1 TABLET BY MOUTH  DAILY   . triamcinolone cream (KENALOG) 0.1 % APPLY TO AFFECTED AREA TWICE A DAY    No facility-administered encounter medications on file as of 11/11/2019.     Goals Addressed            This Visit's Progress   . Collaborate with RN Care Manager to perform appropriate assessments to assist with care coordination needs   On track  CARE PLAN ENTRY (see longitudinal plan of care for additional care plan information)  Current Barriers:  . Transportation . Limited access to caregiver . Inability to perform ADL's independently . Inability to perform IADL's independently . Chronic conditions including DM II, CKD III, and parenchymal renal HTN which increase patient risk for hospitalization  Social Work Clinical Goal(s):  [name] Over the next 60 days the patient will work with SW to identify transportation resources Goal Met . Over the next 120 days the patient will work with SW to address ongoing care coordination needs  CCM SW  Interventions: Completed 11/11/19 with the patient . Received communication from [employer] with [employer] (Mary) indicating patient has been certified to access transportation services "[name] has been certified. I will mail her certification packet next week, however she does not have to wait to make reservations. The reservations number is: [phone]." . Successful outbound call placed to the patient to inform of certification as well as to expect packet in the mail . Provided the patient with contact number to reserve transportation as needed prior to receiving mailed packet . Scheduled follow up over the next month   Completed 11/09/19 with the patient . Collaboration with [name] to assess outcome of patient transportation application to access [employer] (formerly [employer]) o Determined [name] has yet to process application but plans to process the application on 10/62 . Successful outbound call placed to the patient to discuss plan to follow up with [name] on 10/27 to determine patient application status . Determined the patient has contacted [employer] (whom she has been active with in the past) and secured transportation for 11/29 appointment . Advised the patient SW will follow up later this week to confirm application approval/denial for [facility] Access  Completed 11/02/19 with the patient . Successful outbound call placed to the patient to assist with goal progression . Discussed SW submitted [employer] application on 69/4/85 to request patient be approved to access transportation resource . Determined the patient has yet to receive communication regarding eligibility determination o Patient reports she has an [place] appointment on 11/12/19 which she will need transportation assistance for . Collaboration with [name] to request update on patients application status o Determined [name] has yet to process the patients application but  feels she will have this completed in time for patients 10/29/ appointment . Successful outbound call placed  to the patient to advise of application status . Scheduled follow up call to the patient over the next week  Completed 10/26/19 with daughter [name] . Communication with patient daughter, [name], who requests SW assistance with obtaining a handicap placard to be used when transporting the patient . Advised [name] paperwork must be filled out by the patients provider . Collaboration with [name], CMA to request assistance with handicap placard paperwork  Completed 10/20/19 . Collaboration with [name] with [employer] ([facility]) who reports the patients eligibility expired in 2019 . Successful completion of Part A of [employer] application . Collaboration with Dr Baird Cancer to request Part B of [employer] application be completed on the patients behalf . SW to continue to follow  Completed 10/19/19 . Inter-disciplinary care team collaboration (see longitudinal plan of care) . Collaboration with RN Care Manager who indicated patient interest in transportation resources . Successful outbound call placed to the patient to assist with resource needs o Patient reports she has generally used health plan transportation services but she has run out of rides for the remainder  of the plan year . Discussed opportunity to apply for [facility] Access (SCAT) o Patient reports she does not particularly like SC[employer] but is willing to try it again . Determined the patient has used this resource in the past but is unsure if she is still considered an approved rider . Collaboration with [name] with [facility] Access to determine patient eligibility . Advised the patient SW will assist with a new application as needed pending response from [name] . Scheduled follow up over the next week  Patient Self Care Activities:  . Patient verbalizes understanding of plan to work with SW to address  care coordination needs . Self administers medications as prescribed . Calls pharmacy for medication refills . Calls provider office for new concerns or questions . Unable to perform ADLs independently . Unable to perform IADLs independently  Please see past updates related to this goal by clicking on the "Past Updates" button in the selected goal          Follow Up Plan: SW will follow up with patient by phone over the next month.   [name], BSW, CDP Social Worker, Certified Dementia Practitioner [place] [facility] [place] [facility] [phone]  Total time spent performing care coordination and/or care management activities with the patient by phone or face to face = 8 minutes.

## 2019-11-12 ENCOUNTER — Ambulatory Visit (INDEPENDENT_AMBULATORY_CARE_PROVIDER_SITE_OTHER): Payer: Medicare Other | Admitting: Ophthalmology

## 2019-11-12 ENCOUNTER — Encounter (INDEPENDENT_AMBULATORY_CARE_PROVIDER_SITE_OTHER): Payer: Self-pay | Admitting: Ophthalmology

## 2019-11-12 ENCOUNTER — Other Ambulatory Visit: Payer: Self-pay

## 2019-11-12 DIAGNOSIS — H35033 Hypertensive retinopathy, bilateral: Secondary | ICD-10-CM

## 2019-11-12 DIAGNOSIS — H25812 Combined forms of age-related cataract, left eye: Secondary | ICD-10-CM

## 2019-11-12 DIAGNOSIS — H3581 Retinal edema: Secondary | ICD-10-CM

## 2019-11-12 DIAGNOSIS — H401111 Primary open-angle glaucoma, right eye, mild stage: Secondary | ICD-10-CM

## 2019-11-12 DIAGNOSIS — E113313 Type 2 diabetes mellitus with moderate nonproliferative diabetic retinopathy with macular edema, bilateral: Secondary | ICD-10-CM

## 2019-11-12 DIAGNOSIS — I10 Essential (primary) hypertension: Secondary | ICD-10-CM | POA: Diagnosis not present

## 2019-11-12 DIAGNOSIS — H34813 Central retinal vein occlusion, bilateral, with macular edema: Secondary | ICD-10-CM

## 2019-11-12 DIAGNOSIS — Z961 Presence of intraocular lens: Secondary | ICD-10-CM

## 2019-11-12 MED ORDER — TRIAMCINOLONE ACETONIDE 40 MG/ML IO SUSP
4.0000 mg | INTRAOCULAR | Status: AC | PRN
  Administered 2019-11-12: 4 mg via INTRAVITREAL

## 2019-11-15 ENCOUNTER — Other Ambulatory Visit: Payer: Self-pay | Admitting: Occupational Therapy

## 2019-11-15 ENCOUNTER — Other Ambulatory Visit: Payer: Self-pay

## 2019-11-15 ENCOUNTER — Ambulatory Visit: Payer: Medicare Other | Admitting: Internal Medicine

## 2019-11-15 ENCOUNTER — Telehealth: Payer: Self-pay

## 2019-11-15 ENCOUNTER — Ambulatory Visit: Payer: Self-pay

## 2019-11-15 ENCOUNTER — Telehealth: Payer: Medicare Other

## 2019-11-15 DIAGNOSIS — R600 Localized edema: Secondary | ICD-10-CM

## 2019-11-15 DIAGNOSIS — E1122 Type 2 diabetes mellitus with diabetic chronic kidney disease: Secondary | ICD-10-CM

## 2019-11-15 DIAGNOSIS — I129 Hypertensive chronic kidney disease with stage 1 through stage 4 chronic kidney disease, or unspecified chronic kidney disease: Secondary | ICD-10-CM

## 2019-11-15 DIAGNOSIS — N183 Chronic kidney disease, stage 3 unspecified: Secondary | ICD-10-CM

## 2019-11-15 NOTE — Telephone Encounter (Cosign Needed)
  Chronic Care Management   Outreach Note  11/15/2019 Name: Mary [name] MRN: [id number] DOB: [birth year]  Referred by: Dorothyann Peng, MD Reason for referral : Chronic Care Management (CCM RNCM FU Call )   An unsuccessful telephone outreach was attempted today. The patient was referred to the case management team for assistance with care management and care coordination.   Follow Up Plan: A HIPAA compliant phone message was left for the patient providing contact information and requesting a return call.  Telephone follow up appointment with care management team member scheduled for: 12/28/19  Delsa Sale, RN, BSN, CCM Care Management Coordinator [facility] Care Management/[facility]  Direct Phone: [phone]

## 2019-11-16 DIAGNOSIS — E1122 Type 2 diabetes mellitus with diabetic chronic kidney disease: Secondary | ICD-10-CM | POA: Diagnosis not present

## 2019-11-16 DIAGNOSIS — Z96651 Presence of right artificial knee joint: Secondary | ICD-10-CM | POA: Diagnosis not present

## 2019-11-16 DIAGNOSIS — N183 Chronic kidney disease, stage 3 unspecified: Secondary | ICD-10-CM | POA: Diagnosis not present

## 2019-11-16 DIAGNOSIS — M1712 Unilateral primary osteoarthritis, left knee: Secondary | ICD-10-CM | POA: Diagnosis not present

## 2019-11-16 DIAGNOSIS — I129 Hypertensive chronic kidney disease with stage 1 through stage 4 chronic kidney disease, or unspecified chronic kidney disease: Secondary | ICD-10-CM | POA: Diagnosis not present

## 2019-11-16 DIAGNOSIS — Z9181 History of falling: Secondary | ICD-10-CM | POA: Diagnosis not present

## 2019-11-16 DIAGNOSIS — Z7982 Long term (current) use of aspirin: Secondary | ICD-10-CM | POA: Diagnosis not present

## 2019-11-16 NOTE — Patient Outreach (Signed)
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 COMPLETED: Patient Stated       To improve ability to get into and out of the house safely and independently.    ACTION PLANNING - FUNCTIONAL MOBILITY  Target Problem Area: Getting into and out of the house  Why Problem May Occur: -Fear of falling -Severe difficulty with steps -No ramp therefore unable to use RW     Target Goal: To improve ability to get into and out of the house safely    STRATEGIES Saving Your Energy: DO: DON'T:  Take breaks    Raise the height of surfaces    Take frequent rests. Just taking 15 minutes in a comfortable chair before becoming fatigued may help to restore your energy   Remove tripping hazards      Modifying your home environment and making it safe: DO: DON'T:  Install ramp Hold onto unsafe surfaces   Provide adequate lighting Use dim lights or lights that cast a lot of shadows   Simplifying the way you set up tasks or daily routines: DO: DON'T:  Move slowly Rush during transfers or walking   Give yourself plenty of time if raining Don't rush on slick ramp      Practice It is important to practice the strategies so we can determine if they will be effective in helping to reach your goal.  Follow these specific recommendations: 1. Give yourself plenty of time to get out of the house before an appointment 2. Use your ramp and wheelchair or walker 3. Do not attempt steps   If a strategy does not work the first time, try it again and again  (and maybe again). We may make some changes over the next few sessions, based on how they work.   Ezra Sites, OTR/L [phone]     . Patient Stated       To improve ability to get into and out of the tub/shower and perform bathing tasks safely and with greater independence.  ACTION PLANNING - BATHING  Target Problem Area: Getting into/out of shower and performing bathing tasks safely  Why Problem May Occur: -unable to get in/out of tub -Risk of falling -No grab bars     Target Goal: To improve ability to get into/out of shower safely and perform bathing tasks independently.   STRATEGIES Saving Your Energy: DO: DON'T:  Use a tub bench/seat Stand while bathing, it uses more energy  Use appropriate adaptive equipment:  long handled sponge, soap on a rope Rush  Keep all items you'll need within easy reach    Modifying your home environment and making it safe: DO: DON'T:  Install grab bars in the shower and next to the toilet   Place a rubber mat along the entire length of the tub Place loose rugs in the bathroom- they can trip you or your walker/cane can get caught on them  Make sure the bathroom is well lit    Install  a hand held shower head    Simplifying the way you set up tasks or daily routines: DO: DON'T:  Plan to bathe/shower before you're overly tired Rush through [employer] all items before getting started      Practice It is important to practice the strategies so we can determine if they will be effective in helping to reach your goal.  Follow these specific recommendations: 1. Use a shower seat and grab bars 2. Take your time, don't rush during bathing or transferring  3. Use any necessary adaptive equipment  If a strategy does not work the first time, try it again and again (and maybe again). We may make some changes over the next few sessions, based on how they work.   Ezra Sites, OTR/L [phone]         Post  Clinical Reasoning: Client Action (Goal) One Interventions: To improve ability to get into and out of the house. Did Client Try?: Yes Targeted Problem Area Status: A Lot Better  Client Action (Goal) Two Interventions: To improve ability to get into and out of the shower and perform bathing tasks. Did Client Try?: No-unable/unwilling to get up from sitting on EOB this date.  Targeted Problem Area Status: A Little Better  Ezra Sites, OTR/L  [phone] 11/15/2019

## 2019-11-17 DIAGNOSIS — Z7982 Long term (current) use of aspirin: Secondary | ICD-10-CM | POA: Diagnosis not present

## 2019-11-17 DIAGNOSIS — M1712 Unilateral primary osteoarthritis, left knee: Secondary | ICD-10-CM | POA: Diagnosis not present

## 2019-11-17 DIAGNOSIS — Z96651 Presence of right artificial knee joint: Secondary | ICD-10-CM | POA: Diagnosis not present

## 2019-11-17 DIAGNOSIS — E1122 Type 2 diabetes mellitus with diabetic chronic kidney disease: Secondary | ICD-10-CM | POA: Diagnosis not present

## 2019-11-17 DIAGNOSIS — I129 Hypertensive chronic kidney disease with stage 1 through stage 4 chronic kidney disease, or unspecified chronic kidney disease: Secondary | ICD-10-CM | POA: Diagnosis not present

## 2019-11-17 DIAGNOSIS — Z9181 History of falling: Secondary | ICD-10-CM | POA: Diagnosis not present

## 2019-11-17 DIAGNOSIS — N183 Chronic kidney disease, stage 3 unspecified: Secondary | ICD-10-CM | POA: Diagnosis not present

## 2019-11-17 NOTE — Chronic Care Management (AMB) (Signed)
Chronic Care Management   Follow Up Note   11/15/2019 Name: Mary [name] MRN: [id number] DOB: 05-01-1945  Referred by: Glendale Chard, MD Reason for referral : Chronic Care Management (CCM RNCM FU Call )   Mary [name] is a 74 y.o. year old female who is a primary care patient of Glendale Chard, MD. The CCM team was consulted for assistance with chronic disease management and care coordination needs.    Review of patient status, including review of consultants reports, relevant laboratory and other test results, and collaboration with appropriate care team members and the patient's provider was performed as part of comprehensive patient evaluation and provision of chronic care management services.    SDOH (Social Determinants of Health) assessments performed: Yes - no acute challenges identified  See Care Plan activities for detailed interventions related to [facility])     Outpatient Encounter Medications as of 11/15/2019  Medication Sig Note  . Assure Comfort Lancets 28G MISC    . Blood Glucose Monitoring Suppl (ONETOUCH VERIO FLEX SYSTEM) w/Device KIT Use as directed to check blood sugars 2 times per day dx: e11.65   . cetirizine (ZYRTEC) 10 MG tablet TAKE 1 TABLET BY MOUTH EVERY DAY   . dorzolamide-timolol (COSOPT) 22.3-6.8 MG/ML ophthalmic solution Place 1 drop into the right eye 2 (two) times daily.   . fluorometholone (FML) 0.1 % ophthalmic suspension Place 1 drop into the right eye 3 (three) times daily.    . fluticasone (FLONASE) 50 MCG/ACT nasal spray SPRAY 1 SPRAY INTO EACH NOSTRIL EVERY DAY   . furosemide (LASIX) 40 MG tablet Take 1 tablet (40 mg total) by mouth daily.   [name] glucose blood (ONETOUCH VERIO) test strip Use as directed to check blood sugars 2 times per day dx: e11.65   . HYDROcodone-acetaminophen (NORCO) 10-325 MG tablet Take 1 tablet by mouth 5 (five) times daily as needed.   [name] KLOR-CON M20 20 MEQ tablet Take 20 mEq by mouth daily. with food 02/23/2018: Patient  instructed to hold this medication due to abnormal Potassium level of 6.2 on 02/18/18, per MD, Dr. Glendale Chard. Patient was given verbal education related to complications from hypo and hyperkalemia and importance of following MD recommendations. Patient instructed to resume this medication when directed by PCP. Patient verbalizes understanding.   [name] levothyroxine (SYNTHROID) 125 MCG tablet Take 125 mcg by mouth daily before breakfast.   . loratadine (CLARITIN) 10 MG tablet Take 10 mg by mouth daily.    . magnesium oxide (MAG-OX) 400 MG tablet TAKE 1 TABLET BY MOUTH EVERY EVENING   . metoprolol succinate (TOPROL-XL) 50 MG 24 hr tablet    . metoprolol tartrate (LOPRESSOR) 50 MG tablet Take 50 mg by mouth 2 (two) times daily.    [name] omeprazole (PRILOSEC) 20 MG capsule TAKE 1 CAPSULE BY MOUTH  DAILY   . OneTouch Delica Lancets 44Q MISC Use as directed to check blood sugars 2 times per day dx: e11.65   . pravastatin (PRAVACHOL) 40 MG tablet TAKE 1 TABLET BY MOUTH EVERY DAY   . prednisoLONE acetate (PRED FORTE) 1 % ophthalmic suspension Place 1 drop into the right eye 3 (three) times daily.    . predniSONE (DELTASONE) 10 MG tablet Take by mouth.    . telmisartan (MICARDIS) 80 MG tablet TAKE 1 TABLET BY MOUTH  DAILY   . triamcinolone cream (KENALOG) 0.1 % APPLY TO AFFECTED AREA TWICE A DAY    No facility-administered encounter medications on file as of 11/15/2019.  Objective:  Lab Results  Component Value Date   HGBA1C 5.0 04/06/2019   HGBA1C 5.1 06/24/2018   HGBA1C 4.9 02/18/2018   Lab Results  Component Value Date   MICROALBUR 30 11/18/2017   LDLCALC 65 11/16/2018   CREATININE 1.12 (H) 08/08/2019   BP Readings from Last 3 Encounters:  08/08/19 (!) 140/80  06/28/19 (!) 166/98  06/16/19 140/86    Goals Addressed      Patient Stated   .  "I would like to new power Wheelchair to get around better outside of my home" (pt-stated)   On track     [place] (see longitudinal plan of  care for additional care plan information)  Current Barriers:  [name] Knowledge Deficits related to determination for best DME to help improve mobility  . Chronic Disease Management support and education needs related to Diabetes Mellitus with stage 3 Chronic Kidney disease, Parenchymal renal hypertension  . Impaired Physical Mobility   Nurse Case Manager Clinical Goal(s):  [name] Over the next 90 days, patient will verbalize understanding of plan for evaluation and determination for best DME to help improve patient's mobility, manual W/C, verses electric W/C, verses power Scooter Goal Met . New 11/15/19 Over the next 180 days, patient will work with the CCM team and PCP to assist with coordinating DME needs to include a power W/C  CCM RN CM Interventions:  11/15/19 Collaborative call with [facility] nurse . Inter-disciplinary care team collaboration (see longitudinal plan of care) . Evaluation of current treatment plan related to Impaired Physical Mobility and DME needs and patient's adherence to plan as established by provider . Spoke with [facility] nurse Larena Glassman RN regarding patient's mobility issues and DME status . Determined [place] will service patient for the electric W/C needed due to [place] having delay's and lack of communication with the health care team in order to expedite the DME order received  . Determined Larena Glassman RN has initiated this change and has notified [facility] in order for the PT to complete the face to face form needed for PA via Medicare 11/15/19 completed call with patient . Determined patient is aware of the change in DME providers and is up to date on that status of this change . Determined patient continues to use her rental manual W/C as needed with the assistance of her granddaughter who is currently residing with her in her home . Discussed plans with patient for ongoing care management follow up and provided patient with direct contact information for  care management team  Patient Self Care Activities:  . Self administers medications as prescribed . Attends all scheduled provider appointments . Calls pharmacy for medication refills . Calls provider office for new concerns or questions  Please see past updates related to this goal by clicking on the "Past Updates" button in the selected goal      .  "not to have anymore falls" (pt-stated)   On track     [place] (see longitudinal plan of care for additional care plan information)  Current Barriers:  [name] Knowledge Deficits related to evaluation for DME, specifically a power W/C . Chronic Disease Management support and education needs related to Diabetes Mellitus with stage 3 Chronic Kidney disease, Parenchymal renal hypertension   Nurse Case Manager Clinical Goal(s):  [name] Over the next 90 days, patient will work with the CCM team and PCP to address needs related to DME needs to improve Impaired Physical Mobility, Impaired Gait, and  Fall Prevention   CCM RN CM Interventions:  11/15/19 call completed with patient  . Inter-disciplinary care team collaboration (see longitudinal plan of care) . Evaluation of current treatment plan related to Impaired Physical Mobility and patient's adherence to plan as established by provider . Determined patient continues to struggle with mobility and transfers, she denies having actual falls but admits she has experienced several episodes of "sliding to the floor" to prevent from falling . Determined her granddaughter continues to live with her and assist her as needed but is limited as to what help she can offer due to being pregnant . Reinforced the importance of patient taking her time when ambulating and use her walker and or W/C at all times; Encouraged patient to keep her phone with her at all times and to make sure her floors are clutter free and hallways are well lit, provided other fall prevention tips to patient and discussed plan for DME   . Determined patient does have an emergency alert system in place and wears her necklace  . Discussed plans with patient for ongoing care management follow up and provided patient with direct contact information for care management team   Patient Self Care Activities:  . Self administers medications as prescribed . Calls pharmacy for medication refills . Calls provider office for new concerns or questions . Supportive live in granddaughter to offer limited assistance   Please see past updates related to this goal by clicking on the "Past Updates" button in the selected goal      .  "to get the swelling down in my legs" (pt-stated)   Not on track     [place] (see longitudinal plan of care for additional care plan information)  Current Barriers:  [name] Knowledge Deficits related to evaluation and treatment of bilateral lymphedema  . Chronic Disease Management support and education needs related to Diabetes Mellitus with stage 3 Chronic Kidney disease, Parenchymal renal hypertension   Nurse Case Manager Clinical Goal(s):  [name] Over the next 30 days, patient will complete her new patient appointment with the [facility] for evaluation and treatment of lymphedema to her lower extremities  . New 11/15/19 Over the next 180 days, patient will work with the CCM team and PCP to address needs related to disease education and support to help improve Self Health management of Lymphedema  CCM RN CM Interventions:  . Inter-disciplinary care team collaboration (see longitudinal plan of care) . Evaluation of current treatment plan related to right foot ulceration and bilateral lower leg lymphedema and patient's adherence to plan as established by provider . Determined patient is established with Dr. Cannon Kettle DPM for care of her right foot ulcer. Her last OV completed on 07/06/19 is noted with the following Assessment/Plan:  . Problem List Items Addressed This Visit  .  Visit Diagnoses   .    [name] Pre-ulcerative calluses    -  Primary .    [name] Pain due to onychomycosis of toenails of both feet     .    [name] PVD (peripheral vascular disease) ([place])     .    [name] Foot ulcer, right, limited to breakdown of skin ([place])     .    [name] Diabetic polyneuropathy associated with type 2 diabetes mellitus ([place])     .    . Lymphedema       . -Examined patient and discussed the progression of the pinpoint ulceration and chronic edema . -  Mechanically dedbrided ulcerated callus and callus skin bilateral using a sterile chisel blade, no opening to right foot. Betadine applied to protect skin on the right cover with a Band-Aid and advised patient if she would benefit from lymphedema management with wound center as well as then helping to care for the right foot ulceration to prevent it from getting worse.  . -Continue with offloading padding to shoes bilateral . -At no additional charge mechanically debrided nails using sterile nail nipper without incident  . -Patient to return to office in 6 weeks for wound/callus check or sooner if problems arise . Determined patient completed a f/u visit at [facility] on 07/27/19 for evaluation and treatment of lymphadema . Currently patient is wearing compression stockings and elevating extremities but reports lymphedema has worsened; Discussed RN from [place] will make a home visit on 09/13/19 . Collaborated with PCP Dr. Glendale Chard regarding patient's request to be evaluated for mechanical DME such as an electric W/C and or power Scooter; Advised of patient reported worsening lymphedema; Advised RN nurse visit from [place] is scheduled for 09/13/19; Discussed having a [facility] referral placed for ongoing clinical support and advised PCP, sending a Remote [facility] referral will not delay or prevent services from [facility], Advised patient will need her PCP AWV rescheduled due to having previous visit cancelled for unknown reason     . Discussed plans with patient for ongoing care management follow up and provided patient with direct contact information for care management team 10/05/19 RN CM Collaboration with [facility] nurse Larena Glassman RN . Received and reviewed the following patient update from [place]  . Patient is scheduled with the lymphedema Clinic on Thursday, September 23 _0 :30 pm . Determined [facility] PT will discharge patient from PT due to f/u an outpatient clinic such as the lymphedema clinic . Determined patient's lymphedema is severe and this is preventing her from being able to lift her legs independently in order to be able to get into her bed and or elevate her extremities independently . Determined patient's granddaughter is staying with the patient, however the patient will not ask for help and often sleeps on the edge of her bed with her legs in a downward position . Discussed Larena Glassman RN has educated patient on the adverse effects this will have for the lymphedema and may impair of prevent resolution of the lymphedema . Determined Larena Glassman RN will re-evaluate next steps following patient's f/u with the lymphedema clinic scheduled for next week . Determined the [facility] Mobility PT will work with the patient once weekly since she will be d/c from [facility] . Discussed patient may need inpatient admission if she is unable to comply with the treatment recommendations provided by the lymphedema clinic  10/11/19 Inbound call from [place] . Received voice message from [place] advising Ms. Bourne completed her lymphedema new patient consultation with the following recommendations reviewed and noted . Assessment/Plan:  o ASSESSMENT The patient is a 75 y.o. with a medical diagnosis of lymphedema LE's. Due to lymphedema, the patient is at risk for infections and increased time for wound healing. Pt with left >right lower leg/foot swelling flare-up of  chronic swelling x 5 months. Pt would benefit from skilled PT for getting/learning tools needed to reduce and manage swelling. Pt with 2 month history of falls and would benefit from HHPT for transfers/functional strengthening to reduce falls (or if  not truly homebound, outpatient PT for this) Pt may also benefit from a compression pump if not making good progress with reduction when using velcro inelastic compression garments x 1 month. Compression Recommendations: velcro inelastic garments Therapy Diagnosis:  1. Lymphedema  Problem List: Problem List Problem List: Impaired ambulation, Impaired transfer/functional mobility, Need for equipment assessment, Pain, Lymphedema  Other Rehabilitation Considerations: Obesity, Poor activity tolerance and Financial limitation / insurance limitations Response to Evaluation: The session was tolerated well, as evidenced by pt appreciative of info/education Rehabilitation Potential:  Motivation/Commitment to Therapy: Good Rehabilitation Potential: Good  Support Structure: Fair Family member willing to assist Goals: Time frame to achieve long term goals: 01/06/20 Goals Addressed  This Visit's Progress  . PT Goal 1 -  Pt/family are independent in applying compression inelastic velcro garments for LE's, correctly. Within 4 visits  . PT Goal 2 -  Pt verbalizes warning signs of infection and appropriate response. Within 4 visits.  . PT Goal 3 -  Patient in independent in daily Home exercise program for improving lymphatic circulation. Within 4 visits  PLAN Treatment Frequency, Duration, and Interventions: Plan Treatment Frequency and duration : 4 visits within 3 months (by 01/06/20) Time frame to achieve long term goals: 01/06/20 Recommend PT procedures: Manual Therapy, Orthotic Management and Training, Self Care / Home Management, Therapeutic Exercise Necessity: Required to return to Premorbid environment (or reside in new living environment)? no Required  to reduce ADL or IADL assistance to Premorbid level? no Recommended Consults: Prosthetist / Orthotist- Raford Pitcher in [place] of Care: Patient participated in plan of care development today. The patient has been instructed to contact our clinic if any questions or problems should arise. 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 Received call from [place] RN with an update regarding patient's lymphedema progress  . Discussed Ms. Meulemans is wearing Juxta Lite compression stockings and was able to demonstrate how to independently apply and remove the stockings . Discussed a referral was placed to [facility] to [facility] patient's PT at which time the PT evaluation for DME will be completed and faxed to [facility]  . Spoke with patient to determine the Juxta Lite compression stockings have not been effective for her lyphedema, she is wearing them everyday with the help of her granddaughter who apply's the stockings for her . Discussed Ms. Ria Comment scheduled a PCP f/u visit with Dr. Baird Cancer set for 11/24/19 _0  pm to discuss getting orders for lymphedema pumps . Discussed plans with patient for ongoing care management follow up and provided patient with direct contact information for care management team  Patient Self Care Activities:  . Self administers medications as prescribed . Attends all scheduled provider appointments . Calls pharmacy for medication refills . Calls provider  office for new concerns or questions . Supportive live in granddaughter with limited ability to assist with care needs  Please see past updates related to this goal by clicking on the "Past Updates" button in the selected goal      .  "to improve my kidney function" (pt-stated)   Not on track     [place] (  see longitudinal plan of care for additional care plan information)  Current Barriers:  [name] Knowledge Deficits related to disease process and Self Health management of CKD . Chronic Disease Management support and education needs related to Diabetes Mellitus with stage 3 Chronic Kidney disease, Parenchymal renal hypertension   Nurse Case Manager Clinical Goal(s):  . New 11/15/19 Over the next 180 days, patient will work with the CCM team and PCP to address needs related to disease education and support to improve Health Management of CKD   CCM RN CM Interventions:  11/15/19 call completed with patient  . Inter-disciplinary care team collaboration (see longitudinal plan of care) . Evaluation of current treatment plan related to CKD and patient's adherence to plan as established by provider. [name] Re-educated patient re: disease process including stages of CKD; Re-Educated on patient's current decline in GFR and ways to improve Self Health Management of CKD . Confirmed patient received and reviewed the printed educational materials related to Stages of Chronic Kidney Disease; 6 Ways to be Water Wise; [employer] for kidney patients  . Reinforced the importance of drinking plenty of water, 64 oz daily is recommended unless otherwise contraindicated  . Discussed plans with patient for ongoing care management follow up and provided patient with direct contact information for care management team  Patient Self Care Activities:  . Self administers medications as prescribed . Attends all scheduled provider appointments . Calls pharmacy for medication refills . Calls provider office  for new concerns or questions . Supportive live in granddaughter with limited assistance for care needs  Please see past updates related to this goal by clicking on the "Past Updates" button in the selected goal        Plan:   Telephone follow up appointment with care management team member scheduled for: 12/28/19  Barb Merino, RN, BSN, CCM Care Management Coordinator [place] [facility]  Direct Phone: [phone]

## 2019-11-17 NOTE — Patient Instructions (Signed)
Visit Information  Goals Addressed      Patient Stated   .  "I would like to new power Wheelchair to get around better outside of my home" (pt-stated)   On track     CARE PLAN ENTRY (see longitudinal plan of care for additional care plan information)  Current Barriers:  . Knowledge Deficits related to determination for best DME to help improve mobility  . Chronic Disease Management support and education needs related to Diabetes Mellitus with stage 3 Chronic Kidney disease, Parenchymal renal hypertension  . Impaired Physical Mobility   Nurse Case Manager Clinical Goal(s):  . Over the next 90 days, patient will verbalize understanding of plan for evaluation and determination for best DME to help improve patient's mobility, manual W/C, verses electric W/C, verses power Scooter Goal Met . New 11/15/19 Over the next 180 days, patient will work with the CCM team and PCP to assist with coordinating DME needs to include a power W/C  CCM RN CM Interventions:  11/15/19 Collaborative call with [facility] nurse . Inter-disciplinary care team collaboration (see longitudinal plan of care) . Evaluation of current treatment plan related to Impaired Physical Mobility and DME needs and patient's adherence to plan as established by provider . Spoke with [facility] nurse Trisha RN regarding patient's mobility issues and DME status . Determined [facility] will service patient for the electric W/C needed due to [facility] having delay's and lack of communication with the health care team in order to expedite the DME order received  . Determined Trisha RN has initiated this change and has notified [facility] in order for the PT to complete the face to face form needed for PA via Medicare 11/15/19 completed call with patient . Determined patient is aware of the change in DME providers and is up to date on that status of this change . Determined patient continues to use her rental manual W/C as needed  with the assistance of her granddaughter who is currently residing with her in her home . Discussed plans with patient for ongoing care management follow up and provided patient with direct contact information for care management team  Patient Self Care Activities:  . Self administers medications as prescribed . Attends all scheduled provider appointments . Calls pharmacy for medication refills . Calls provider office for new concerns or questions  Please see past updates related to this goal by clicking on the "Past Updates" button in the selected goal      .  "not to have anymore falls" (pt-stated)   On track     CARE PLAN ENTRY (see longitudinal plan of care for additional care plan information)  Current Barriers:  . Knowledge Deficits related to evaluation for DME, specifically a power W/C . Chronic Disease Management support and education needs related to Diabetes Mellitus with stage 3 Chronic Kidney disease, Parenchymal renal hypertension   Nurse Case Manager Clinical Goal(s):  . Over the next 90 days, patient will work with the CCM team and PCP to address needs related to DME needs to improve Impaired Physical Mobility, Impaired Gait, and Fall Prevention   CCM RN CM Interventions:  11/15/19 call completed with patient  . Inter-disciplinary care team collaboration (see longitudinal plan of care) . Evaluation of current treatment plan related to Impaired Physical Mobility and patient's adherence to plan as established by provider . Determined patient continues to struggle with mobility and transfers, she denies having actual falls but admits she has experienced several episodes   of "sliding to the floor" to prevent from falling . Determined her granddaughter continues to live with her and assist her as needed but is limited as to what help she can offer due to being pregnant . Reinforced the importance of patient taking her time when ambulating and use her walker and or W/C at all  times; Encouraged patient to keep her phone with her at all times and to make sure her floors are clutter free and hallways are well lit, provided other fall prevention tips to patient and discussed plan for DME  . Determined patient does have an emergency alert system in place and wears her necklace  . Discussed plans with patient for ongoing care management follow up and provided patient with direct contact information for care management team   Patient Self Care Activities:  . Self administers medications as prescribed . Calls pharmacy for medication refills . Calls provider office for new concerns or questions . Supportive live in granddaughter to offer limited assistance   Please see past updates related to this goal by clicking on the "Past Updates" button in the selected goal      .  "to get the swelling down in my legs" (pt-stated)   Not on track     CARE PLAN ENTRY (see longitudinal plan of care for additional care plan information)  Current Barriers:  . Knowledge Deficits related to evaluation and treatment of bilateral lymphedema  . Chronic Disease Management support and education needs related to Diabetes Mellitus with stage 3 Chronic Kidney disease, Parenchymal renal hypertension   Nurse Case Manager Clinical Goal(s):  . Over the next 30 days, patient will complete her new patient appointment with the [facility] for evaluation and treatment of lymphedema to her lower extremities  . New 11/15/19 Over the next 180 days, patient will work with the CCM team and PCP to address needs related to disease education and support to help improve Self Health management of Lymphedema  CCM RN CM Interventions:  . Inter-disciplinary care team collaboration (see longitudinal plan of care) . Evaluation of current treatment plan related to right foot ulceration and bilateral lower leg lymphedema and patient's adherence to plan as established by provider . Determined patient is  established with Dr. Stover DPM for care of her right foot ulcer. Her last OV completed on 07/06/19 is noted with the following Assessment/Plan:  . Problem List Items Addressed This Visit  .  Visit Diagnoses  .    . Pre-ulcerative calluses    -  Primary .    . Pain due to onychomycosis of toenails of both feet     .    . PVD (peripheral vascular disease) (HCC)     .    . Foot ulcer, right, limited to breakdown of skin (HCC)     .    . Diabetic polyneuropathy associated with type 2 diabetes mellitus (HCC)     .    . Lymphedema       . -Examined patient and discussed the progression of the pinpoint ulceration and chronic edema . -Mechanically dedbrided ulcerated callus and callus skin bilateral using a sterile chisel blade, no opening to right foot. Betadine applied to protect skin on the right cover with a Band-Aid and advised patient if she would benefit from lymphedema management with wound center as well as then helping to care for the right foot ulceration to prevent it from getting worse.  . -Continue with offloading padding   to shoes bilateral . -At no additional charge mechanically debrided nails using sterile nail nipper without incident  . -Patient to return to office in 6 weeks for wound/callus check or sooner if problems arise . Determined patient completed a f/u visit at the [facility] on 07/27/19 for evaluation and treatment of lymphadema . Currently patient is wearing compression stockings and elevating extremities but reports lymphedema has worsened; Discussed RN from [facility] will make a home visit on 09/13/19 . Collaborated with PCP Dr. Robyn Sanders regarding patient's request to be evaluated for mechanical DME such as an electric W/C and or power Scooter; Advised of patient reported worsening lymphedema; Advised RN nurse visit from [facility] is scheduled for 09/13/19; Discussed having a [facility] referral placed for ongoing clinical support and advised  PCP, sending a [facility] referral will not delay or prevent services from [facility], Advised patient will need her PCP [facility] rescheduled due to having previous visit cancelled for unknown reason    . Discussed plans with patient for ongoing care management follow up and provided patient with direct contact information for care management team 10/05/19 RN CM Collaboration with [facility] nurse Trisha RN . Received and reviewed the following patient update from [facility] nurse Trisha RN  . Patient is scheduled with the lymphedema Clinic on Thursday, September 23 @1:30 pm . Determined [facility] PT will discharge patient from PT due to f/u an outpatient clinic such as the lymphedema clinic . Determined patient's lymphedema is severe and this is preventing her from being able to lift her legs independently in order to be able to get into her bed and or elevate her extremities independently . Determined patient's granddaughter is staying with the patient, however the patient will not ask for help and often sleeps on the edge of her bed with her legs in a downward position . Discussed Trisha RN has educated patient on the adverse effects this will have for the lymphedema and may impair of prevent resolution of the lymphedema . Determined Trisha RN will re-evaluate next steps following patient's f/u with the lymphedema clinic scheduled for next week . Determined the [facility] Mobility PT will work with the patient once weekly since she will be d/c from [facility] PT . Discussed patient may need inpatient admission if she is unable to comply with the treatment recommendations provided by the lymphedema clinic  10/11/19 Inbound call from [facility] nurse Trisha RN . Received voice message from [facility] RN Trisha advising Ms. Soley completed her lymphedema new patient consultation with the following recommendations reviewed and noted . Assessment/Plan:  o ASSESSMENT The patient is a  73 y.o. with a medical diagnosis of lymphedema LE's. Due to lymphedema, the patient is at risk for infections and increased time for wound healing. Pt with left >right lower leg/foot swelling flare-up of chronic swelling x 5 months. Pt would benefit from skilled PT for getting/learning tools needed to reduce and manage swelling. Pt with 2 month history of falls and would benefit from HHPT for transfers/functional strengthening to reduce falls (or if not truly homebound, outpatient PT for this) Pt may also benefit from a compression pump if not making good progress with reduction when using velcro inelastic compression garments x 1 month. Compression Recommendations: velcro inelastic garments Therapy Diagnosis:  1. Lymphedema  Problem List: Problem List Problem List: Impaired ambulation, Impaired transfer/functional mobility, Need for equipment assessment, Pain, Lymphedema  Other Rehabilitation Considerations: Obesity, Poor activity tolerance   and Financial limitation / insurance limitations Response to Evaluation: The session was tolerated well, as evidenced by pt appreciative of info/education Rehabilitation Potential:  Motivation/Commitment to Therapy: Good Rehabilitation Potential: Good  Support Structure: Fair Family member willing to assist Goals: Time frame to achieve long term goals: 01/06/20 Goals Addressed  This Visit's Progress  . PT Goal 1 -  Pt/family are independent in applying compression inelastic velcro garments for LE's, correctly. Within 4 visits  . PT Goal 2 -  Pt verbalizes warning signs of infection and appropriate response. Within 4 visits.  . PT Goal 3 -  Patient in independent in daily Home exercise program for improving lymphatic circulation. Within 4 visits  PLAN Treatment Frequency, Duration, and Interventions: Plan Treatment Frequency and duration : 4 visits within 3 months (by 01/06/20) Time frame to achieve long term goals: 01/06/20 Recommend PT procedures:  Manual Therapy, Orthotic Management and Training, Self Care / Home Management, Therapeutic Exercise Necessity: Required to return to Premorbid environment (or reside in new living environment)? no Required to reduce ADL or IADL assistance to Premorbid level? no Recommended Consults: Prosthetist / Orthotist- [place] in [place] Development of Plan of Care: Patient participated in plan of care development today. The patient has been instructed to contact our clinic if any questions or problems should arise. 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 Received call from [facility] nurse Trisha RN with an update regarding patient's lymphedema progress  . Discussed Ms. Boteler is wearing Juxta Lite compression stockings and was able to demonstrate how to independently apply and remove the stockings . Discussed a referral was placed to [facility] to resume patient's PT at which time the PT evaluation for DME will be completed and faxed to [facility]  . Spoke with patient to determine the Juxta Lite compression stockings have not been effective for her lyphedema, she is wearing them everyday with the help of her granddaughter who apply's the stockings for her . Discussed Ms. Laursen scheduled a PCP f/u visit with Dr. Sanders set for 11/24/19 @4 pm to discuss getting orders for lymphedema pumps . Discussed plans with patient for ongoing care management follow up and provided patient with direct contact information  for care management team  Patient Self Care Activities:  . Self administers medications as prescribed . Attends all scheduled provider appointments . Calls pharmacy for medication refills . Calls provider office for new concerns or questions . Supportive live in granddaughter with limited ability to assist with care needs  Please see past updates related to this goal by clicking on the "Past Updates" button in the selected goal      .  "to improve my kidney function" (pt-stated)   Not on track     CARE PLAN ENTRY (see longitudinal plan of care for additional care plan information)  Current Barriers:  . Knowledge Deficits related to disease process and Self Health management of CKD . Chronic Disease Management support and education needs related to Diabetes Mellitus with stage 3 Chronic Kidney disease, Parenchymal renal hypertension   Nurse Case Manager Clinical Goal(s):  . New 11/15/19 Over the next 180 days, patient will work   with the CCM team and PCP to address needs related to disease education and support to improve Health Management of CKD   CCM RN CM Interventions:  11/15/19 call completed with patient  . Inter-disciplinary care team collaboration (see longitudinal plan of care) . Evaluation of current treatment plan related to CKD and patient's adherence to plan as established by provider. [name] Re-educated patient re: disease process including stages of CKD; Re-Educated on patient's current decline in GFR and ways to improve Self Health Management of CKD . Confirmed patient received and reviewed the printed educational materials related to Stages of Chronic Kidney Disease; 6 Ways to be Water Wise; [employer] for kidney patients  . Reinforced the importance of drinking plenty of water, 64 oz daily is recommended unless otherwise contraindicated  . Discussed plans with patient for ongoing care management follow up and provided patient with direct contact information for care  management team  Patient Self Care Activities:  . Self administers medications as prescribed . Attends all scheduled provider appointments . Calls pharmacy for medication refills . Calls provider office for new concerns or questions . Supportive live in granddaughter with limited assistance for care needs  Please see past updates related to this goal by clicking on the "Past Updates" button in the selected goal        Patient verbalizes understanding of instructions provided today.   Telephone follow up appointment with care management team member scheduled for: 12/28/19  Barb Merino, RN, BSN, CCM Care Management Coordinator [facility]  Direct Phone: [phone]

## 2019-11-18 DIAGNOSIS — N183 Chronic kidney disease, stage 3 unspecified: Secondary | ICD-10-CM | POA: Diagnosis not present

## 2019-11-18 DIAGNOSIS — M1712 Unilateral primary osteoarthritis, left knee: Secondary | ICD-10-CM | POA: Diagnosis not present

## 2019-11-18 DIAGNOSIS — Z7982 Long term (current) use of aspirin: Secondary | ICD-10-CM | POA: Diagnosis not present

## 2019-11-18 DIAGNOSIS — Z96651 Presence of right artificial knee joint: Secondary | ICD-10-CM | POA: Diagnosis not present

## 2019-11-18 DIAGNOSIS — I129 Hypertensive chronic kidney disease with stage 1 through stage 4 chronic kidney disease, or unspecified chronic kidney disease: Secondary | ICD-10-CM | POA: Diagnosis not present

## 2019-11-18 DIAGNOSIS — E1122 Type 2 diabetes mellitus with diabetic chronic kidney disease: Secondary | ICD-10-CM | POA: Diagnosis not present

## 2019-11-18 DIAGNOSIS — Z9181 History of falling: Secondary | ICD-10-CM | POA: Diagnosis not present

## 2019-11-22 DIAGNOSIS — Z9181 History of falling: Secondary | ICD-10-CM | POA: Diagnosis not present

## 2019-11-22 DIAGNOSIS — M1712 Unilateral primary osteoarthritis, left knee: Secondary | ICD-10-CM | POA: Diagnosis not present

## 2019-11-22 DIAGNOSIS — I129 Hypertensive chronic kidney disease with stage 1 through stage 4 chronic kidney disease, or unspecified chronic kidney disease: Secondary | ICD-10-CM | POA: Diagnosis not present

## 2019-11-22 DIAGNOSIS — N183 Chronic kidney disease, stage 3 unspecified: Secondary | ICD-10-CM | POA: Diagnosis not present

## 2019-11-22 DIAGNOSIS — E1122 Type 2 diabetes mellitus with diabetic chronic kidney disease: Secondary | ICD-10-CM | POA: Diagnosis not present

## 2019-11-22 DIAGNOSIS — Z7982 Long term (current) use of aspirin: Secondary | ICD-10-CM | POA: Diagnosis not present

## 2019-11-22 DIAGNOSIS — Z96651 Presence of right artificial knee joint: Secondary | ICD-10-CM | POA: Diagnosis not present

## 2019-11-24 ENCOUNTER — Ambulatory Visit (INDEPENDENT_AMBULATORY_CARE_PROVIDER_SITE_OTHER): Payer: Medicare Other | Admitting: Internal Medicine

## 2019-11-24 ENCOUNTER — Other Ambulatory Visit: Payer: Self-pay

## 2019-11-24 ENCOUNTER — Encounter: Payer: Self-pay | Admitting: Internal Medicine

## 2019-11-24 VITALS — BP 132/84 | HR 66 | Temp 98.4°F | Ht 67.0 in

## 2019-11-24 DIAGNOSIS — I129 Hypertensive chronic kidney disease with stage 1 through stage 4 chronic kidney disease, or unspecified chronic kidney disease: Secondary | ICD-10-CM

## 2019-11-24 DIAGNOSIS — Z6841 Body Mass Index (BMI) 40.0 and over, adult: Secondary | ICD-10-CM

## 2019-11-24 DIAGNOSIS — E039 Hypothyroidism, unspecified: Secondary | ICD-10-CM | POA: Diagnosis not present

## 2019-11-24 DIAGNOSIS — Z23 Encounter for immunization: Secondary | ICD-10-CM | POA: Diagnosis not present

## 2019-11-24 DIAGNOSIS — E1122 Type 2 diabetes mellitus with diabetic chronic kidney disease: Secondary | ICD-10-CM | POA: Diagnosis not present

## 2019-11-24 DIAGNOSIS — J0100 Acute maxillary sinusitis, unspecified: Secondary | ICD-10-CM

## 2019-11-24 DIAGNOSIS — R0982 Postnasal drip: Secondary | ICD-10-CM

## 2019-11-24 DIAGNOSIS — N183 Chronic kidney disease, stage 3 unspecified: Secondary | ICD-10-CM | POA: Diagnosis not present

## 2019-11-24 NOTE — Patient Instructions (Signed)
Diabetes Mellitus and Foot Care Foot care is an important part of your health, especially when you have diabetes. Diabetes may cause you to have problems because of poor blood flow (circulation) to your feet and legs, which can cause your skin to:  Become thinner and drier.  Break more easily.  Heal more slowly.  Peel and crack. You may also have nerve damage (neuropathy) in your legs and feet, causing decreased feeling in them. This means that you may not notice minor injuries to your feet that could lead to more serious problems. Noticing and addressing any potential problems early is the best way to prevent future foot problems. How to care for your feet Foot hygiene  Wash your feet daily with warm water and mild soap. Do not use hot water. Then, pat your feet and the areas between your toes until they are completely dry. Do not soak your feet as this can dry your skin.  Trim your toenails straight across. Do not dig under them or around the cuticle. File the edges of your nails with an emery board or nail file.  Apply a moisturizing lotion or petroleum jelly to the skin on your feet and to dry, brittle toenails. Use lotion that does not contain alcohol and is unscented. Do not apply lotion between your toes. Shoes and socks  Wear clean socks or stockings every day. Make sure they are not too tight. Do not wear knee-high stockings since they may decrease blood flow to your legs.  Wear shoes that fit properly and have enough cushioning. Always look in your shoes before you put them on to be sure there are no objects inside.  To break in new shoes, wear them for just a few hours a day. This prevents injuries on your feet. Wounds, scrapes, corns, and calluses  Check your feet daily for blisters, cuts, bruises, sores, and redness. If you cannot see the bottom of your feet, use a mirror or ask someone for help.  Do not cut corns or calluses or try to remove them with medicine.  If you  find a minor scrape, cut, or break in the skin on your feet, keep it and the skin around it clean and dry. You may clean these areas with mild soap and water. Do not clean the area with peroxide, alcohol, or iodine.  If you have a wound, scrape, corn, or callus on your foot, look at it several times a day to make sure it is healing and not infected. Check for: ? Redness, swelling, or pain. ? Fluid or blood. ? Warmth. ? Pus or a bad smell. General instructions  Do not cross your legs. This may decrease blood flow to your feet.  Do not use heating pads or hot water bottles on your feet. They may burn your skin. If you have lost feeling in your feet or legs, you may not know this is happening until it is too late.  Protect your feet from hot and cold by wearing shoes, such as at the beach or on hot pavement.  Schedule a complete foot exam at least once a year (annually) or more often if you have foot problems. If you have foot problems, report any cuts, sores, or bruises to your health care provider immediately. Contact a health care provider if:  You have a medical condition that increases your risk of infection and you have any cuts, sores, or bruises on your feet.  You have an injury that is not   healing.  You have redness on your legs or feet.  You feel burning or tingling in your legs or feet.  You have pain or cramps in your legs and feet.  Your legs or feet are numb.  Your feet always feel cold.  You have pain around a toenail. Get help right away if:  You have a wound, scrape, corn, or callus on your foot and: ? You have pain, swelling, or redness that gets worse. ? You have fluid or blood coming from the wound, scrape, corn, or callus. ? Your wound, scrape, corn, or callus feels warm to the touch. ? You have pus or a bad smell coming from the wound, scrape, corn, or callus. ? You have a fever. ? You have a red line going up your leg. Summary  Check your feet every day  for cuts, sores, red spots, swelling, and blisters.  Moisturize feet and legs daily.  Wear shoes that fit properly and have enough cushioning.  If you have foot problems, report any cuts, sores, or bruises to your health care provider immediately.  Schedule a complete foot exam at least once a year (annually) or more often if you have foot problems. This information is not intended to replace advice given to you by your health care provider. Make sure you discuss any questions you have with your health care provider. Document Revised: 09/23/2018 Document Reviewed: 02/02/2016 Elsevier Patient Education  2020 [employer]  

## 2019-11-24 NOTE — Progress Notes (Signed)
I,Katawbba Wiggins,acting as a Education administrator for Maximino Greenland, MD.,have documented all relevant documentation on the behalf of Maximino Greenland, MD,as directed by  Maximino Greenland, MD while in the presence of Maximino Greenland, MD.  This visit occurred during the SARS-CoV-2 public health emergency.  Safety protocols were in place, including screening questions prior to the visit, additional usage of staff PPE, and extensive cleaning of exam room while observing appropriate contact time as indicated for disinfecting solutions.  Subjective:     Patient ID: Mary [name] , female    DOB: [birth year] , 74 y.o.   MRN: [id number]   Chief Complaint  Patient presents with  . Diabetes  . Hypertension    HPI  The patient is here today for a diabetes and hypertension follow-up.  She reports compliance with meds. Admits she doesn't move around much.   Diabetes She presents for her follow-up diabetic visit. She has type 2 diabetes mellitus. Her disease course has been stable. There are no hypoglycemic associated symptoms. Pertinent negatives for hypoglycemia include no headaches. Associated symptoms include fatigue. Pertinent negatives for diabetes include no blurred vision and no chest pain. There are no hypoglycemic complications. Diabetic complications include nephropathy. She is following a diabetic diet.  Hypertension This is a chronic problem. The current episode started more than 1 year ago. Pertinent negatives include no blurred vision, chest pain, headaches, orthopnea, palpitations or shortness of breath. Risk factors for coronary artery disease include diabetes mellitus, dyslipidemia, post-menopausal state and sedentary lifestyle.     Past Medical History:  Diagnosis Date  . Arthritis    osteoarthritis  . Cataract    OS  . Diabetes mellitus    Insulin dependent  . Diabetic retinopathy ([place])    NPDR OU  . Glaucoma    POAG OD  . H/O cardiovascular stress test    pt. reports 10/13- was  normal per pt.   . Hyperlipemia   . Hypertension   . Hypertensive retinopathy    OU     Family History  Problem Relation Age of Onset  . Hypertension Mother   . Heart Problems Father   . Gout Father   . Arthritis Father      Current Outpatient Medications:  .  Assure Comfort Lancets 28G MISC, , Disp: , Rfl:  .  Blood Glucose Monitoring Suppl (ONETOUCH VERIO FLEX SYSTEM) w/Device KIT, Use as directed to check blood sugars 2 times per day dx: e11.65, Disp: 1 kit, Rfl: 1 .  cetirizine (ZYRTEC) 10 MG tablet, TAKE 1 TABLET BY MOUTH EVERY DAY, Disp: 90 tablet, Rfl: 1 .  Dexchlorpheniramine Maleate (RYCLORA) 2 MG/5ML SOLN, 5cc twice daily as needed, Disp: 118 mL, Rfl: 0 .  dorzolamide-timolol (COSOPT) 22.3-6.8 MG/ML ophthalmic solution, Place 1 drop into the right eye 2 (two) times daily., Disp: , Rfl:  .  fluorometholone (FML) 0.1 % ophthalmic suspension, Place 1 drop into the right eye 3 (three) times daily. , Disp: , Rfl:  .  fluticasone (FLONASE) 50 MCG/ACT nasal spray, SPRAY 1 SPRAY INTO EACH NOSTRIL EVERY DAY, Disp: 32 mL, Rfl: 1 .  furosemide (LASIX) 40 MG tablet, Take 1 tablet (40 mg total) by mouth daily., Disp: 90 tablet, Rfl: 2 .  glucose blood (ONETOUCH VERIO) test strip, Use as directed to check blood sugars 2 times per day dx: e11.65, Disp: 100 each, Rfl: 3 .  HYDROcodone-acetaminophen (NORCO) 10-325 MG tablet, Take 1 tablet by mouth 5 (five) times daily as needed., Disp: ,  Rfl:  .  KLOR-CON M20 20 MEQ tablet, Take 20 mEq by mouth daily. with food, Disp: , Rfl: 1 .  levothyroxine (SYNTHROID) 125 MCG tablet, Take 125 mcg by mouth daily before breakfast., Disp: , Rfl:  .  magnesium oxide (MAG-OX) 400 MG tablet, TAKE 1 TABLET BY MOUTH EVERY EVENING, Disp: 90 tablet, Rfl: 1 .  metoprolol succinate (TOPROL-XL) 50 MG 24 hr tablet, , Disp: , Rfl:  .  metoprolol tartrate (LOPRESSOR) 50 MG tablet, Take 50 mg by mouth 2 (two) times daily. , Disp: , Rfl:  .  omeprazole (PRILOSEC) 20 MG  capsule, TAKE 1 CAPSULE BY MOUTH  DAILY (Patient not taking: Reported on 11/25/2019), Disp: 90 capsule, Rfl: 3 .  OneTouch Delica Lancets 06T MISC, Use as directed to check blood sugars 2 times per day dx: e11.65, Disp: 100 each, Rfl: 3 .  pravastatin (PRAVACHOL) 40 MG tablet, TAKE 1 TABLET BY MOUTH EVERY DAY, Disp: 90 tablet, Rfl: 1 .  prednisoLONE acetate (PRED FORTE) 1 % ophthalmic suspension, Place 1 drop into the right eye 3 (three) times daily. , Disp: , Rfl:  .  predniSONE (DELTASONE) 10 MG tablet, Take by mouth. , Disp: , Rfl:  .  telmisartan (MICARDIS) 80 MG tablet, TAKE 1 TABLET BY MOUTH  DAILY, Disp: 90 tablet, Rfl: 3 .  triamcinolone (KENALOG) 0.1 %, APPLY TO AFFECTED AREA TWICE A DAY, Disp: 30 g, Rfl: 0   No Known Allergies   Review of Systems  Constitutional: Positive for fatigue.  HENT: Positive for congestion and postnasal drip.   Eyes: Negative for blurred vision.  Respiratory: Negative.  Negative for shortness of breath.   Cardiovascular: Positive for leg swelling. Negative for chest pain, palpitations and orthopnea.  Gastrointestinal: Negative.   Neurological: Negative for headaches.  Psychiatric/Behavioral: Negative.   All other systems reviewed and are negative.    Today's Vitals   11/24/19 1620  BP: 132/84  Pulse: 66  Temp: 98.4 F (36.9 C)  TempSrc: Oral  Height: 5' 7"  (1.702 m)  PainSc: 8   PainLoc: Shoulder   Body mass index is 46.99 kg/m.  Wt Readings from Last 3 Encounters:  09/02/19 300 lb (136.1 kg)  06/28/19 290 lb (131.5 kg)  04/06/19 293 lb 12.8 oz (133.3 kg)   Objective:  Physical Exam Vitals and nursing note reviewed.  Constitutional:      Appearance: Normal appearance. She is obese.  HENT:     Head: Normocephalic and atraumatic.  Cardiovascular:     Rate and Rhythm: Normal rate and regular rhythm.     Heart sounds: Normal heart sounds.  Pulmonary:     Breath sounds: Normal breath sounds.  Musculoskeletal:     Right lower leg:  Edema present.     Left lower leg: Edema present.  Skin:    General: Skin is warm.  Neurological:     General: No focal deficit present.     Mental Status: She is alert and oriented to person, place, and time.         Assessment And Plan:     1. Diabetes mellitus with stage 3 chronic kidney disease ([place]) Comments: This is now diet controlled. I will check labs and forward results to Dr. Chalmers Cater, her endocrinologist for further review.  - CMP14+EGFR - Hemoglobin A1c  2. Parenchymal renal hypertension, stage 1 through stage 4 or unspecified chronic kidney disease Comments: Chronic, fair control. She will continue with current meds. Encouraged to avoid adding salt to her  foods.   3. Primary hypothyroidism Comments: I will check thyroid panel and adjust meds as needed.  - TSH - T4, free  4. Postnasal drip Comments: She agrees to COVID testing. She will continue with cetirizine daily.  She was also given samples of Norel Ad to take twice daily as needed.   5. Class 3 severe obesity due to excess calories with serious comorbidity and body mass index (BMI) of 45.0 to 49.9 in adult [facility]) Comments: She is encouraged to strive to lose 10-15 pounds to decrease cardiac risk. Encouraged to perform chair exercises while watching TV.   6. Need for vaccination Comments: She was given high dose flu vaccine.  - Flu Vaccine QUAD High Dose(Fluad)    Patient was given opportunity to ask questions. Patient verbalized understanding of the plan and was able to repeat key elements of the plan. All questions were answered to their satisfaction.  Maximino Greenland, MD   I, Maximino Greenland, MD, have reviewed all documentation for this visit. The documentation on 12/20/19 for the exam, diagnosis, procedures, and orders are all accurate and complete.  THE PATIENT IS ENCOURAGED TO PRACTICE SOCIAL DISTANCING DUE TO THE COVID-19 PANDEMIC.

## 2019-11-25 ENCOUNTER — Telehealth: Payer: Self-pay

## 2019-11-25 ENCOUNTER — Telehealth: Payer: Medicare Other

## 2019-11-25 ENCOUNTER — Other Ambulatory Visit: Payer: Self-pay

## 2019-11-25 ENCOUNTER — Ambulatory Visit: Payer: Self-pay

## 2019-11-25 DIAGNOSIS — Z79891 Long term (current) use of opiate analgesic: Secondary | ICD-10-CM | POA: Diagnosis not present

## 2019-11-25 DIAGNOSIS — G894 Chronic pain syndrome: Secondary | ICD-10-CM | POA: Diagnosis not present

## 2019-11-25 DIAGNOSIS — M25512 Pain in left shoulder: Secondary | ICD-10-CM | POA: Diagnosis not present

## 2019-11-25 DIAGNOSIS — E1122 Type 2 diabetes mellitus with diabetic chronic kidney disease: Secondary | ICD-10-CM

## 2019-11-25 DIAGNOSIS — Z6841 Body Mass Index (BMI) 40.0 and over, adult: Secondary | ICD-10-CM

## 2019-11-25 DIAGNOSIS — I129 Hypertensive chronic kidney disease with stage 1 through stage 4 chronic kidney disease, or unspecified chronic kidney disease: Secondary | ICD-10-CM

## 2019-11-25 DIAGNOSIS — N183 Chronic kidney disease, stage 3 unspecified: Secondary | ICD-10-CM

## 2019-11-25 DIAGNOSIS — M25561 Pain in right knee: Secondary | ICD-10-CM | POA: Diagnosis not present

## 2019-11-25 DIAGNOSIS — M150 Primary generalized (osteo)arthritis: Secondary | ICD-10-CM | POA: Diagnosis not present

## 2019-11-25 DIAGNOSIS — R600 Localized edema: Secondary | ICD-10-CM

## 2019-11-25 DIAGNOSIS — E039 Hypothyroidism, unspecified: Secondary | ICD-10-CM

## 2019-11-25 LAB — CMP14+EGFR
ALT: 10 IU/L (ref 0–32)
AST: 12 IU/L (ref 0–40)
Albumin/Globulin Ratio: 1.4 (ref 1.2–2.2)
Albumin: 4.2 g/dL (ref 3.7–4.7)
Alkaline Phosphatase: 107 IU/L (ref 44–121)
BUN/Creatinine Ratio: 19 (ref 12–28)
BUN: 24 mg/dL (ref 8–27)
Bilirubin Total: 0.5 mg/dL (ref 0.0–1.2)
CO2: 24 mmol/L (ref 20–29)
Calcium: 9.8 mg/dL (ref 8.7–10.3)
Chloride: 102 mmol/L (ref 96–106)
Creatinine, Ser: 1.27 mg/dL — ABNORMAL HIGH (ref 0.57–1.00)
GFR calc Af Amer: 49 mL/min/{1.73_m2} — ABNORMAL LOW (ref >59)
GFR calc non Af Amer: 43 mL/min/{1.73_m2} — ABNORMAL LOW (ref >59)
Globulin, Total: 3.1 g/dL (ref 1.5–4.5)
Glucose: 91 mg/dL (ref 65–99)
Potassium: 4.5 mmol/L (ref 3.5–5.2)
Sodium: 138 mmol/L (ref 134–144)
Total Protein: 7.3 g/dL (ref 6.0–8.5)

## 2019-11-25 LAB — T4, FREE: Free T4: 2.05 ng/dL — ABNORMAL HIGH (ref 0.82–1.77)

## 2019-11-25 LAB — HEMOGLOBIN A1C
Est. average glucose Bld gHb Est-mCnc: 108 mg/dL
Hgb A1c MFr Bld: 5.4 % (ref 4.8–5.6)

## 2019-11-25 LAB — TSH: TSH: 1.47 u[IU]/mL (ref 0.450–4.500)

## 2019-11-25 NOTE — Telephone Encounter (Signed)
The pt called and said that the Rycolra medication helped her feel better.

## 2019-11-26 DIAGNOSIS — Z96651 Presence of right artificial knee joint: Secondary | ICD-10-CM | POA: Diagnosis not present

## 2019-11-26 DIAGNOSIS — Z9181 History of falling: Secondary | ICD-10-CM | POA: Diagnosis not present

## 2019-11-26 DIAGNOSIS — N183 Chronic kidney disease, stage 3 unspecified: Secondary | ICD-10-CM | POA: Diagnosis not present

## 2019-11-26 DIAGNOSIS — E1122 Type 2 diabetes mellitus with diabetic chronic kidney disease: Secondary | ICD-10-CM | POA: Diagnosis not present

## 2019-11-26 DIAGNOSIS — M1712 Unilateral primary osteoarthritis, left knee: Secondary | ICD-10-CM | POA: Diagnosis not present

## 2019-11-26 DIAGNOSIS — I129 Hypertensive chronic kidney disease with stage 1 through stage 4 chronic kidney disease, or unspecified chronic kidney disease: Secondary | ICD-10-CM | POA: Diagnosis not present

## 2019-11-26 DIAGNOSIS — Z7982 Long term (current) use of aspirin: Secondary | ICD-10-CM | POA: Diagnosis not present

## 2019-11-26 NOTE — Patient Instructions (Signed)
Visit Information  Goals Addressed      Patient Stated   .  "I would like to new power Wheelchair to get around better outside of my home" (pt-stated)   On track     [place] (see longitudinal plan of care for additional care plan information)  Current Barriers:  [name] Knowledge Deficits related to determination for best DME to help improve mobility  . Chronic Disease Management support and education needs related to Diabetes Mellitus with stage 3 Chronic Kidney disease, Parenchymal renal hypertension  . Impaired Physical Mobility   Nurse Case Manager Clinical Goal(s):  [name] Over the next 90 days, patient will verbalize understanding of plan for evaluation and determination for best DME to help improve patient's mobility, manual W/C, verses electric W/C, verses power Scooter Goal Met . New 11/15/19 Over the next 180 days, patient will work with the CCM team and PCP to assist with coordinating DME needs to include a power W/C  CCM RN CM Interventions:  11/15/19 Collaborative call with [facility] nurse . Inter-disciplinary care team collaboration (see longitudinal plan of care) . Evaluation of current treatment plan related to Impaired Physical Mobility and DME needs and patient's adherence to plan as established by provider . Spoke with [facility] nurse Larena Glassman RN regarding patient's mobility issues and DME status . Determined [place] will service patient for the electric W/C needed due to [place] having delay's and lack of communication with the health care team in order to expedite the DME order received  . Determined Larena Glassman RN has initiated this change and has notified [facility] in order for the PT to complete the face to face form needed for PA via Medicare 11/15/19 completed call with patient . Determined patient is aware of the change in DME providers and is up to date on that status of this change . Determined patient continues to use her rental manual W/C as needed  with the assistance of her granddaughter who is currently residing with her in her home . Discussed plans with patient for ongoing care management follow up and provided patient with direct contact information for care management team 11/25/19 CM RN Case Collaboration  . Placed outbound call to Benjamin Perez for status update on patient's power W/C . Determined the intake for this DME has been initiated and further information is needed related to patient's PT evaluation . Determined [place] has left a message with [facility] and [facility] PT requesting this information  . Placed outbound call to patient, provided status update for power W/C . Discussed plans with patient for ongoing care management follow up and provided patient with direct contact information for care management team  Patient Self Care Activities:  . Self administers medications as prescribed . Attends all scheduled provider appointments . Calls pharmacy for medication refills . Calls provider office for new concerns or questions  Please see past updates related to this goal by clicking on the "Past Updates" button in the selected goal      .  "to improve my kidney function" (pt-stated)   Not on track     [place] (see longitudinal plan of care for additional care plan information)  Current Barriers:  [name] Knowledge Deficits related to disease process and Self Health management of CKD . Chronic Disease Management support and education needs related to Diabetes Mellitus with stage 3 Chronic Kidney disease, Parenchymal renal hypertension   Nurse Case Manager Clinical Goal(s):  . New 11/15/19  Over the next 180 days, patient will work with the CCM team and PCP to address needs related to disease education and support to improve Health Management of CKD   CCM RN CM Interventions:  11/25/19 call completed with patient  . Inter-disciplinary care team collaboration (see longitudinal plan of care) . Evaluation of  current treatment plan related to CKD and patient's adherence to plan as established by provider. . Educated patient on current GFR <49; Re-educated patient re: disease process including stages of CKD; Re-Educated on patient's current decline in GFR and ways to improve Self Health Management of CKD . Confirmed patient received and reviewed the printed educational materials related to Stages of Chronic Kidney Disease; 6 Ways to be Water Wise; [employer] for kidney patients  . Reinforced the importance of drinking plenty of water, 64 oz daily is recommended unless otherwise contraindicated  . Discussed plans with patient for ongoing care management follow up and provided patient with direct contact information for care management team  Patient Self Care Activities:  . Self administers medications as prescribed . Attends all scheduled provider appointments . Calls pharmacy for medication refills . Calls provider office for new concerns or questions . Supportive live in granddaughter with limited assistance for care needs  Please see past updates related to this goal by clicking on the "Past Updates" button in the selected goal      .  "to improve my thyroid function" (pt-stated)        CARE PLAN ENTRY (see longitudinal plan of care for additional care plan information)  Current Barriers:  [name] Knowledge Deficits related to disease process and Self Health management of Hypothyroidism  . Chronic Disease Management support and education needs related to Diabetes Mellitus with stage 3 Chronic Kidney disease, Parenchymal renal hypertension, Class 3 severe obesity, Edema of left lower extremity, Primary Hypothyroidism    Nurse Case Manager Clinical Goal(s):  [name] Over the next 90 days, patient will work with the CCM team and PCP to address needs related to disease education and support for improved Self Health management of Hypothyroidism   CCM RN CM Interventions:  11/25/19 call completed  with patient  . Inter-disciplinary care team collaboration (see longitudinal plan of care) . Evaluation of current treatment plan related to Hypothyroidism  and patient's adherence to plan as established by provider. . Provided education to patient re: current abnormal thyroid level; Educated on target range and recommendations to achieve therapeutic level . Reviewed medications with patient and discussed patient is adhering to taking her Synthroid 125 mcg qd as prescribed; Re-educated on how to take this medication and how to space out taking her vitamins and anti-acids following administration of this medication  . Discussed plans with patient for ongoing care management follow up and provided patient with direct contact information for care management team  Patient Self Care Activities:  . Self administers medications as prescribed . Attends all scheduled provider appointments . Calls pharmacy for medication refills . Calls provider office for new concerns or questions . Lacks social connections . Unable to perform ADLs independently . Unable to perform IADLs independently  Initial goal documentation       The patient verbalized understanding of instructions, educational materials, and care plan provided today and agreed to receive a mailed copy of patient instructions, educational materials, and care plan.   Telephone follow up appointment with care management team member scheduled for: 12/28/19  Barb Merino, RN, BSN, CCM Care Management Coordinator [facility]  Direct Phone: [phone]

## 2019-11-26 NOTE — Chronic Care Management (AMB) (Signed)
Chronic Care Management   Follow Up Note   11/25/2019 Name: Mary [name] MRN: [id number] DOB: 05/27/1945  Referred by: Glendale Chard, MD Reason for referral : Chronic Care Management (CCM RNCM FU Call )   Mary [name] is a 74 y.o. year old female who is a primary care patient of Glendale Chard, MD. The CCM team was consulted for assistance with chronic disease management and care coordination needs.    Review of patient status, including review of consultants reports, relevant laboratory and other test results, and collaboration with appropriate care team members and the patient's provider was performed as part of comprehensive patient evaluation and provision of chronic care management services.    SDOH (Social Determinants of Health) assessments performed: Yes - no acute challenges  See Care Plan activities for detailed interventions related to [place])   Placed outbound CCM RN CM follow up call to patient for a care plan update.     Outpatient Encounter Medications as of 11/25/2019  Medication Sig Note  . Assure Comfort Lancets 28G MISC    . Blood Glucose Monitoring Suppl (ONETOUCH VERIO FLEX SYSTEM) w/Device KIT Use as directed to check blood sugars 2 times per day dx: e11.65   . cetirizine (ZYRTEC) 10 MG tablet TAKE 1 TABLET BY MOUTH EVERY DAY   . dorzolamide-timolol (COSOPT) 22.3-6.8 MG/ML ophthalmic solution Place 1 drop into the right eye 2 (two) times daily.   . fluorometholone (FML) 0.1 % ophthalmic suspension Place 1 drop into the right eye 3 (three) times daily.    . fluticasone (FLONASE) 50 MCG/ACT nasal spray SPRAY 1 SPRAY INTO EACH NOSTRIL EVERY DAY   . furosemide (LASIX) 40 MG tablet Take 1 tablet (40 mg total) by mouth daily.   [name] glucose blood (ONETOUCH VERIO) test strip Use as directed to check blood sugars 2 times per day dx: e11.65   . HYDROcodone-acetaminophen (NORCO) 10-325 MG tablet Take 1 tablet by mouth 5 (five) times daily as needed.   [name] KLOR-CON M20  20 MEQ tablet Take 20 mEq by mouth daily. with food 02/23/2018: Patient instructed to hold this medication due to abnormal Potassium level of 6.2 on 02/18/18, per MD, Dr. Glendale Chard. Patient was given verbal education related to complications from hypo and hyperkalemia and importance of following MD recommendations. Patient instructed to resume this medication when directed by PCP. Patient verbalizes understanding.   [name] levothyroxine (SYNTHROID) 125 MCG tablet Take 125 mcg by mouth daily before breakfast.   . magnesium oxide (MAG-OX) 400 MG tablet TAKE 1 TABLET BY MOUTH EVERY EVENING   . metoprolol succinate (TOPROL-XL) 50 MG 24 hr tablet    . metoprolol tartrate (LOPRESSOR) 50 MG tablet Take 50 mg by mouth 2 (two) times daily.    [name] omeprazole (PRILOSEC) 20 MG capsule TAKE 1 CAPSULE BY MOUTH  DAILY (Patient not taking: Reported on 11/25/2019)   . OneTouch Delica Lancets 78G MISC Use as directed to check blood sugars 2 times per day dx: e11.65   . pravastatin (PRAVACHOL) 40 MG tablet TAKE 1 TABLET BY MOUTH EVERY DAY   . prednisoLONE acetate (PRED FORTE) 1 % ophthalmic suspension Place 1 drop into the right eye 3 (three) times daily.    . predniSONE (DELTASONE) 10 MG tablet Take by mouth.    . telmisartan (MICARDIS) 80 MG tablet TAKE 1 TABLET BY MOUTH  DAILY   . triamcinolone cream (KENALOG) 0.1 % APPLY TO AFFECTED AREA TWICE A DAY    No facility-administered encounter  medications on file as of 11/25/2019.     Objective:  Lab Results  Component Value Date   HGBA1C 5.4 11/24/2019   HGBA1C 5.0 04/06/2019   HGBA1C 5.1 06/24/2018   Lab Results  Component Value Date   MICROALBUR 30 11/18/2017   LDLCALC 65 11/16/2018   CREATININE 1.27 (H) 11/24/2019   BP Readings from Last 3 Encounters:  11/24/19 132/84  08/08/19 (!) 140/80  06/28/19 (!) 166/98    Goals Addressed      Patient Stated   .  "I would like to new power Wheelchair to get around better outside of my home" (pt-stated)   On track      [place] (see longitudinal plan of care for additional care plan information)  Current Barriers:  [name] Knowledge Deficits related to determination for best DME to help improve mobility  . Chronic Disease Management support and education needs related to Diabetes Mellitus with stage 3 Chronic Kidney disease, Parenchymal renal hypertension  . Impaired Physical Mobility   Nurse Case Manager Clinical Goal(s):  [name] Over the next 90 days, patient will verbalize understanding of plan for evaluation and determination for best DME to help improve patient's mobility, manual W/C, verses electric W/C, verses power Scooter Goal Met . New 11/15/19 Over the next 180 days, patient will work with the CCM team and PCP to assist with coordinating DME needs to include a power W/C  CCM RN CM Interventions:  11/15/19 Collaborative call with [facility] nurse . Inter-disciplinary care team collaboration (see longitudinal plan of care) . Evaluation of current treatment plan related to Impaired Physical Mobility and DME needs and patient's adherence to plan as established by provider . Spoke with [facility] nurse Larena Glassman RN regarding patient's mobility issues and DME status . Determined [place] will service patient for the electric W/C needed due to [place] having delay's and lack of communication with the health care team in order to expedite the DME order received  . Determined Larena Glassman RN has initiated this change and has notified [facility] in order for the PT to complete the face to face form needed for PA via Medicare 11/15/19 completed call with patient . Determined patient is aware of the change in DME providers and is up to date on that status of this change . Determined patient continues to use her rental manual W/C as needed with the assistance of her granddaughter who is currently residing with her in her home . Discussed plans with patient for ongoing care management follow up and  provided patient with direct contact information for care management team 11/25/19 CM RN Case Collaboration  . Placed outbound call to [place] for status update on patient's power W/C . Determined the intake for this DME has been initiated and further information is needed related to patient's PT evaluation . Determined [place] has left a message with [facility] and [facility] PT requesting this information  . Placed outbound call to patient, provided status update for power W/C . Discussed plans with patient for ongoing care management follow up and provided patient with direct contact information for care management team  Patient Self Care Activities:  . Self administers medications as prescribed . Attends all scheduled provider appointments . Calls pharmacy for medication refills . Calls provider office for new concerns or questions  Please see past updates related to this goal by clicking on the "Past Updates" button in the selected goal      .  "  to improve my kidney function" (pt-stated)   Not on track     [place] (see longitudinal plan of care for additional care plan information)  Current Barriers:  [name] Knowledge Deficits related to disease process and Self Health management of CKD . Chronic Disease Management support and education needs related to Diabetes Mellitus with stage 3 Chronic Kidney disease, Parenchymal renal hypertension   Nurse Case Manager Clinical Goal(s):  . New 11/15/19 Over the next 180 days, patient will work with the CCM team and PCP to address needs related to disease education and support to improve Health Management of CKD   CCM RN CM Interventions:  11/25/19 call completed with patient  . Inter-disciplinary care team collaboration (see longitudinal plan of care) . Evaluation of current treatment plan related to CKD and patient's adherence to plan as established by provider. . Educated patient on current GFR <49; Re-educated patient re:  disease process including stages of CKD; Re-Educated on patient's current decline in GFR and ways to improve Self Health Management of CKD . Confirmed patient received and reviewed the printed educational materials related to Stages of Chronic Kidney Disease; 6 Ways to be Water Wise; [employer] for kidney patients  . Reinforced the importance of drinking plenty of water, 64 oz daily is recommended unless otherwise contraindicated  . Discussed plans with patient for ongoing care management follow up and provided patient with direct contact information for care management team  Patient Self Care Activities:  . Self administers medications as prescribed . Attends all scheduled provider appointments . Calls pharmacy for medication refills . Calls provider office for new concerns or questions . Supportive live in granddaughter with limited assistance for care needs  Please see past updates related to this goal by clicking on the "Past Updates" button in the selected goal      .  "to improve my thyroid function" (pt-stated)        CARE PLAN ENTRY (see longitudinal plan of care for additional care plan information)  Current Barriers:  [name] Knowledge Deficits related to disease process and Self Health management of Hypothyroidism  . Chronic Disease Management support and education needs related to Diabetes Mellitus with stage 3 Chronic Kidney disease, Parenchymal renal hypertension, Class 3 severe obesity, Edema of left lower extremity, Primary Hypothyroidism    Nurse Case Manager Clinical Goal(s):  [name] Over the next 90 days, patient will work with the CCM team and PCP to address needs related to disease education and support for improved Self Health management of Hypothyroidism   CCM RN CM Interventions:  11/25/19 call completed with patient  . Inter-disciplinary care team collaboration (see longitudinal plan of care) . Evaluation of current treatment plan related to Hypothyroidism  and  patient's adherence to plan as established by provider. . Provided education to patient re: current abnormal thyroid level; Educated on target range and recommendations to achieve therapeutic level . Reviewed medications with patient and discussed patient is adhering to taking her Synthroid 125 mcg qd as prescribed; Re-educated on how to take this medication and how to space out taking her vitamins and anti-acids following administration of this medication  . Discussed plans with patient for ongoing care management follow up and provided patient with direct contact information for care management team  Patient Self Care Activities:  . Self administers medications as prescribed . Attends all scheduled provider appointments . Calls pharmacy for medication refills . Calls provider office for new concerns or questions . Lacks social  connections . Unable to perform ADLs independently . Unable to perform IADLs independently  Initial goal documentation       Plan:   Telephone follow up appointment with care management team member scheduled for: 12/28/19  Barb Merino, RN, BSN, CCM Care Management Coordinator [place] [facility]  Direct Phone: [phone]

## 2019-11-29 ENCOUNTER — Other Ambulatory Visit: Payer: Self-pay | Admitting: Internal Medicine

## 2019-11-29 MED ORDER — DEXCHLORPHENIRAMINE MALEATE 2 MG/5ML PO SOLN: ORAL | 0 refills | Status: DC

## 2019-11-30 DIAGNOSIS — Z7982 Long term (current) use of aspirin: Secondary | ICD-10-CM | POA: Diagnosis not present

## 2019-11-30 DIAGNOSIS — E1122 Type 2 diabetes mellitus with diabetic chronic kidney disease: Secondary | ICD-10-CM | POA: Diagnosis not present

## 2019-11-30 DIAGNOSIS — I129 Hypertensive chronic kidney disease with stage 1 through stage 4 chronic kidney disease, or unspecified chronic kidney disease: Secondary | ICD-10-CM | POA: Diagnosis not present

## 2019-11-30 DIAGNOSIS — Z96651 Presence of right artificial knee joint: Secondary | ICD-10-CM | POA: Diagnosis not present

## 2019-11-30 DIAGNOSIS — M1712 Unilateral primary osteoarthritis, left knee: Secondary | ICD-10-CM | POA: Diagnosis not present

## 2019-11-30 DIAGNOSIS — Z9181 History of falling: Secondary | ICD-10-CM | POA: Diagnosis not present

## 2019-11-30 DIAGNOSIS — N183 Chronic kidney disease, stage 3 unspecified: Secondary | ICD-10-CM | POA: Diagnosis not present

## 2019-12-02 ENCOUNTER — Other Ambulatory Visit: Payer: Self-pay

## 2019-12-02 ENCOUNTER — Other Ambulatory Visit: Payer: Self-pay | Admitting: *Deleted

## 2019-12-02 ENCOUNTER — Encounter: Payer: Self-pay | Admitting: *Deleted

## 2019-12-02 NOTE — Patient Outreach (Signed)
Aging Psychiatrist Visit # 4- final: in person at patient's home  12/02/2019  Mary [name] 10-01-1945 [id number]  Visit:   Aging Gracefully RN Visit # 4- final  Start Time:   12:25 pm End Time:   12:50 pm Total Minutes:   25 minutes  Readiness To Change Score:  Readiness to Change Score: 9.33  Universal RN Interventions: Exercise Review: Yes (reviewed with client today) Pain: Yes (reviewed with client today) PCP Advocacy/Support: Yes (reviewed with client today) Fall Prevention: Yes (reviewed with client today) Incontinence:  (reviewed with client today)  Healthcare Provider Communication: Did Higher education careers adviser With [employer] Provider?: No  Clinician View of Client Situation: Clinician View Of Client Situation: [name] reports great improvement in her overall confidence in managing her daily life now that the home modifications have been completed in her home; she reports no recent falls and has replaced her old mattress with a new one that is easier for her to manage her mobility challenges- she no longer worries about sliding off of her tall mattress.  She continues to work with her Primary Care team to obtain a new motorized wheelchair and is hopeful this will occur soon.  She has successfully met her previously established Aging Gracefully goals around fall prevention and looks forward to her next visit with the Aging Gracefully OT team.   [employer] of His/Her Situation: Client View Of His/Her Situation: "I can't believe how nice all these changes are in my home-- all of my family and neighbors just can't believe how much better the home repairs have made my life and how great everything looks.  I know I will always have health and aging challenges, but I feel real good about the future."  OT Update:  Will update Aging Gracefully team around successful completion of final RN Visit today  Session Summary: Pleasant final RN visit with patient at her home; her  granddaughter was present for today's visit and she and client are very proud of and pleased with all the home modifications, and report that these interventions have greatly improved client's day-day life.  Patient expresses optimism in completing the final OT interventions to help her learn the best ways to navigate the newly completed home modifications.  Client has successfully completed her previously established Aging Gracefully RN goals around fall prevention.  It has been a pleasure working with [name] in the [employer] program,  [name], RN, BSN, [employer] [facility] Care Management  [phone]

## 2019-12-05 ENCOUNTER — Other Ambulatory Visit: Payer: Self-pay | Admitting: Internal Medicine

## 2019-12-07 ENCOUNTER — Ambulatory Visit: Payer: Medicare Other

## 2019-12-07 DIAGNOSIS — N183 Chronic kidney disease, stage 3 unspecified: Secondary | ICD-10-CM

## 2019-12-07 DIAGNOSIS — I129 Hypertensive chronic kidney disease with stage 1 through stage 4 chronic kidney disease, or unspecified chronic kidney disease: Secondary | ICD-10-CM

## 2019-12-07 DIAGNOSIS — E1122 Type 2 diabetes mellitus with diabetic chronic kidney disease: Secondary | ICD-10-CM

## 2019-12-07 NOTE — Patient Instructions (Signed)
Social Worker Visit Information  Goals we discussed today:  Goals Addressed            This Visit's Progress   . Collaborate with RN Care Manager to perform appropriate assessments to assist with care coordination needs   On track    [place] (see longitudinal plan of care for additional care plan information)  Current Barriers:  . Transportation . Limited access to caregiver . Inability to perform ADL's independently . Inability to perform IADL's independently . Chronic conditions including DM II, CKD III, and parenchymal renal HTN which increase patient risk for hospitalization  Social Work Clinical Goal(s):  [name] Over the next 60 days the patient will work with SW to identify transportation resources Goal Met . Over the next 120 days the patient will work with SW to address ongoing care coordination needs  CCM SW Interventions: Completed 12/07/19 . Successful outbound call placed to the patient to review goal progression . Determined the patient has successfully accessed [employer] for transportation needs . Discussed the patient has yet to receive handicap placard to use when her daughter is transporting her . Advised the patient SW was previously informed the paperwork had be mailed to the patient in October . Collaboration with [name] CMA with primary provider office to request follow up . Scheduled follow up call over the next two weeks to provide patient with an update once received  Completed 11/11/19 with the patient . Received communication from [employer] with [employer] ([place]) indicating patient has been certified to access transportation services "Ms. Boutin has been certified. I will mail her certification packet next week, however she does not have to wait to make reservations. The reservations number is: [phone]." . Successful outbound call placed to the patient to inform of certification as well as to expect packet in the mail . Provided  the patient with contact number to reserve transportation as needed prior to receiving mailed packet . Scheduled follow up over the next month   Completed 11/09/19 with the patient . Collaboration with [name] to assess outcome of patient transportation application to access [employer]) o Determined [name] has yet to process application but plans to process the application on 93/71 . Successful outbound call placed to the patient to discuss plan to follow up with [name] on 10/27 to determine patient application status . Determined the patient has contacted [employer] (whom she has been active with in the past) and secured transportation for 11/29 appointment . Advised the patient SW will follow up later this week to confirm application approval/denial for [facility] Access  Completed 11/02/19 with the patient . Successful outbound call placed to the patient to assist with goal progression . Discussed SW submitted [employer] application on 69/6/78 to request patient be approved to access transportation resource . Determined the patient has yet to receive communication regarding eligibility determination o Patient reports she has an [place] appointment on 11/12/19 which she will need transportation assistance for . Collaboration with [name] to request update on patients application status o Determined [name] has yet to process the patients application but feels she will have this completed in time for patients 10/29/ appointment . Successful outbound call placed  to the patient to advise of application status . Scheduled follow up call to the patient over the next week  Completed 10/26/19 with daughter [name] . Communication with patient daughter, [name], who requests SW assistance with obtaining a handicap placard to  be used when transporting the patient . Advised [name] paperwork must be filled out by the patients provider . Collaboration with  [name], CMA to request assistance with handicap placard paperwork  Completed 10/20/19 . Collaboration with [name] with [employer] ([facility]) who reports the patients eligibility expired in 2019 . Successful completion of Part A of [employer] application . Collaboration with Dr Baird Cancer to request Part B of [employer] application be completed on the patients behalf . SW to continue to follow  Completed 10/19/19 . Inter-disciplinary care team collaboration (see longitudinal plan of care) . Collaboration with RN Care Manager who indicated patient interest in transportation resources . Successful outbound call placed to the patient to assist with resource needs o Patient reports she has generally used health plan transportation services but she has run out of rides for the remainder of the plan year . Discussed opportunity to apply for [facility] ([facility]) o Patient reports she does not particularly like [employer] but is willing to try it again . Determined the patient has used this resource in the past but is unsure if she is still considered an approved rider . Collaboration with [name] with [facility] Access to determine patient eligibility . Advised the patient SW will assist with a new application as needed pending response from [name] . Scheduled follow up over the next week  Patient Self Care Activities:  . Patient verbalizes understanding of plan to work with SW to address care coordination needs . Self administers medications as prescribed . Calls pharmacy for medication refills . Calls provider office for new concerns or questions . Unable to perform ADLs independently . Unable to perform IADLs independently  Please see past updates related to this goal by clicking on the "Past Updates" button in the selected goal          Follow Up Plan: SW will follow up with patient by phone over the next two weeks.   [name], BSW, CDP Social Worker, Certified  Dementia Practitioner [place] [facility] [place] [facility] [phone]

## 2019-12-07 NOTE — Chronic Care Management (AMB) (Signed)
Chronic Care Management    Social Work Follow Up Note  12/07/2019 Name: Mary [name] MRN: [id number] DOB: [birth year]  Mary [name] is a 74 y.o. year old female who is a primary care patient of Glendale Chard, MD. The CCM team was consulted for assistance with care coordination.   Review of patient status, including review of consultants reports, other relevant assessments, and collaboration with appropriate care team members and the patient's provider was performed as part of comprehensive patient evaluation and provision of chronic care management services.    SDOH (Social Determinants of Health) assessments performed: No    Outpatient Encounter Medications as of 12/07/2019  Medication Sig Note  . Assure Comfort Lancets 28G MISC    . Blood Glucose Monitoring Suppl (ONETOUCH VERIO FLEX SYSTEM) w/Device KIT Use as directed to check blood sugars 2 times per day dx: e11.65   . cetirizine (ZYRTEC) 10 MG tablet TAKE 1 TABLET BY MOUTH EVERY DAY   . Dexchlorpheniramine Maleate (RYCLORA) 2 MG/5ML SOLN 5cc twice daily as needed   . dorzolamide-timolol (COSOPT) 22.3-6.8 MG/ML ophthalmic solution Place 1 drop into the right eye 2 (two) times daily.   . fluorometholone (FML) 0.1 % ophthalmic suspension Place 1 drop into the right eye 3 (three) times daily.    . fluticasone (FLONASE) 50 MCG/ACT nasal spray SPRAY 1 SPRAY INTO EACH NOSTRIL EVERY DAY   . furosemide (LASIX) 40 MG tablet Take 1 tablet (40 mg total) by mouth daily.   [name] glucose blood (ONETOUCH VERIO) test strip Use as directed to check blood sugars 2 times per day dx: e11.65   . HYDROcodone-acetaminophen (NORCO) 10-325 MG tablet Take 1 tablet by mouth 5 (five) times daily as needed.   [name] KLOR-CON M20 20 MEQ tablet Take 20 mEq by mouth daily. with food 02/23/2018: Patient instructed to hold this medication due to abnormal Potassium level of 6.2 on 02/18/18, per MD, Dr. Glendale Chard. Patient was given verbal education related to  complications from hypo and hyperkalemia and importance of following MD recommendations. Patient instructed to resume this medication when directed by PCP. Patient verbalizes understanding.   [name] levothyroxine (SYNTHROID) 125 MCG tablet Take 125 mcg by mouth daily before breakfast.   . magnesium oxide (MAG-OX) 400 MG tablet TAKE 1 TABLET BY MOUTH EVERY EVENING   . metoprolol succinate (TOPROL-XL) 50 MG 24 hr tablet    . metoprolol tartrate (LOPRESSOR) 50 MG tablet Take 50 mg by mouth 2 (two) times daily.    [name] omeprazole (PRILOSEC) 20 MG capsule TAKE 1 CAPSULE BY MOUTH  DAILY (Patient not taking: Reported on 11/25/2019)   . OneTouch Delica Lancets 71I MISC Use as directed to check blood sugars 2 times per day dx: e11.65   . pravastatin (PRAVACHOL) 40 MG tablet TAKE 1 TABLET BY MOUTH EVERY DAY   . prednisoLONE acetate (PRED FORTE) 1 % ophthalmic suspension Place 1 drop into the right eye 3 (three) times daily.    . predniSONE (DELTASONE) 10 MG tablet Take by mouth.    . telmisartan (MICARDIS) 80 MG tablet TAKE 1 TABLET BY MOUTH  DAILY   . triamcinolone (KENALOG) 0.1 % APPLY TO AFFECTED AREA TWICE A DAY    No facility-administered encounter medications on file as of 12/07/2019.     Goals Addressed            This Visit's Progress   . Collaborate with RN Care Manager to perform appropriate assessments to assist with care coordination needs  On track    [place] (see longitudinal plan of care for additional care plan information)  Current Barriers:  . Transportation . Limited access to caregiver . Inability to perform ADL's independently . Inability to perform IADL's independently . Chronic conditions including DM II, CKD III, and parenchymal renal HTN which increase patient risk for hospitalization  Social Work Clinical Goal(s):  [name] Over the next 60 days the patient will work with SW to identify transportation resources Goal Met . Over the next 120 days the patient will work with  SW to address ongoing care coordination needs  CCM SW Interventions: Completed 12/07/19 . Successful outbound call placed to the patient to review goal progression . Determined the patient has successfully accessed [employer] for transportation needs . Discussed the patient has yet to receive handicap placard to use when her daughter is transporting her . Advised the patient SW was previously informed the paperwork had be mailed to the patient in October . Collaboration with [name] CMA with primary provider office to request follow up . Scheduled follow up call over the next two weeks to provide patient with an update once received  Completed 11/11/19 with the patient . Received communication from [employer] with [employer] ([place]) indicating patient has been certified to access transportation services "Ms. Hill has been certified. I will mail her certification packet next week, however she does not have to wait to make reservations. The reservations number is: [phone]." . Successful outbound call placed to the patient to inform of certification as well as to expect packet in the mail . Provided the patient with contact number to reserve transportation as needed prior to receiving mailed packet . Scheduled follow up over the next month   Completed 11/09/19 with the patient . Collaboration with [name] to assess outcome of patient transportation application to access [employer]) o Determined [name] has yet to process application but plans to process the application on 67/67 . Successful outbound call placed to the patient to discuss plan to follow up with [name] on 10/27 to determine patient application status . Determined the patient has contacted [employer] (whom she has been active with in the past) and secured transportation for 11/29 appointment . Advised the patient SW will follow up later this week to confirm application  approval/denial for [facility] Access  Completed 11/02/19 with the patient . Successful outbound call placed to the patient to assist with goal progression . Discussed SW submitted [employer] application on 20/9/47 to request patient be approved to access transportation resource . Determined the patient has yet to receive communication regarding eligibility determination o Patient reports she has an [place] appointment on 11/12/19 which she will need transportation assistance for . Collaboration with [name] to request update on patients application status o Determined [name] has yet to process the patients application but feels she will have this completed in time for patients 10/29/ appointment . Successful outbound call placed  to the patient to advise of application status . Scheduled follow up call to the patient over the next week  Completed 10/26/19 with daughter [name] . Communication with patient daughter, [name], who requests SW assistance with obtaining a handicap placard to be used when transporting the patient . Advised [name] paperwork must be filled out by the patients provider . Collaboration with [name], CMA to request assistance with handicap placard paperwork  Completed 10/20/19 . Collaboration with [name] with [employer] ([employer]) who reports  the patients eligibility expired in 2019 . Successful completion of Part A of [employer] application . Collaboration with Dr Baird Cancer to request Part B of [employer] application be completed on the patients behalf . SW to continue to follow  Completed 10/19/19 . Inter-disciplinary care team collaboration (see longitudinal plan of care) . Collaboration with RN Care Manager who indicated patient interest in transportation resources . Successful outbound call placed to the patient to assist with resource needs o Patient reports she has generally used health plan transportation services but she has run out of rides for  the remainder of the plan year . Discussed opportunity to apply for [facility] Access ([facility]) o Patient reports she does not particularly like [employer] but is willing to try it again . Determined the patient has used this resource in the past but is unsure if she is still considered an approved rider . Collaboration with [name] with [facility] Access to determine patient eligibility . Advised the patient SW will assist with a new application as needed pending response from [name] . Scheduled follow up over the next week  Patient Self Care Activities:  . Patient verbalizes understanding of plan to work with SW to address care coordination needs . Self administers medications as prescribed . Calls pharmacy for medication refills . Calls provider office for new concerns or questions . Unable to perform ADLs independently . Unable to perform IADLs independently  Please see past updates related to this goal by clicking on the "Past Updates" button in the selected goal          Follow Up Plan: SW will follow up with patient by phone over the next two weeks,   [name], BSW, CDP Social Worker, Certified Dementia Practitioner [place] [facility] [place] [facility] [phone]  Total time spent performing care coordination and/or care management activities with the patient by phone or face to face = 15 minutes.

## 2019-12-08 ENCOUNTER — Telehealth: Payer: Self-pay

## 2019-12-08 NOTE — Telephone Encounter (Signed)
The pt was notified that another handicap placard application has been placed out in the mail to the pt.

## 2019-12-11 DIAGNOSIS — Z96651 Presence of right artificial knee joint: Secondary | ICD-10-CM | POA: Diagnosis not present

## 2019-12-11 DIAGNOSIS — N183 Chronic kidney disease, stage 3 unspecified: Secondary | ICD-10-CM | POA: Diagnosis not present

## 2019-12-11 DIAGNOSIS — I129 Hypertensive chronic kidney disease with stage 1 through stage 4 chronic kidney disease, or unspecified chronic kidney disease: Secondary | ICD-10-CM | POA: Diagnosis not present

## 2019-12-11 DIAGNOSIS — Z9181 History of falling: Secondary | ICD-10-CM | POA: Diagnosis not present

## 2019-12-11 DIAGNOSIS — Z7982 Long term (current) use of aspirin: Secondary | ICD-10-CM | POA: Diagnosis not present

## 2019-12-11 DIAGNOSIS — E1122 Type 2 diabetes mellitus with diabetic chronic kidney disease: Secondary | ICD-10-CM | POA: Diagnosis not present

## 2019-12-11 DIAGNOSIS — M1712 Unilateral primary osteoarthritis, left knee: Secondary | ICD-10-CM | POA: Diagnosis not present

## 2019-12-15 DIAGNOSIS — Z9181 History of falling: Secondary | ICD-10-CM | POA: Diagnosis not present

## 2019-12-15 DIAGNOSIS — M1712 Unilateral primary osteoarthritis, left knee: Secondary | ICD-10-CM | POA: Diagnosis not present

## 2019-12-15 DIAGNOSIS — N183 Chronic kidney disease, stage 3 unspecified: Secondary | ICD-10-CM | POA: Diagnosis not present

## 2019-12-15 DIAGNOSIS — Z96651 Presence of right artificial knee joint: Secondary | ICD-10-CM | POA: Diagnosis not present

## 2019-12-15 DIAGNOSIS — I129 Hypertensive chronic kidney disease with stage 1 through stage 4 chronic kidney disease, or unspecified chronic kidney disease: Secondary | ICD-10-CM | POA: Diagnosis not present

## 2019-12-15 DIAGNOSIS — E1122 Type 2 diabetes mellitus with diabetic chronic kidney disease: Secondary | ICD-10-CM | POA: Diagnosis not present

## 2019-12-15 DIAGNOSIS — Z7982 Long term (current) use of aspirin: Secondary | ICD-10-CM | POA: Diagnosis not present

## 2019-12-20 ENCOUNTER — Ambulatory Visit: Payer: Medicare Other

## 2019-12-20 DIAGNOSIS — E1122 Type 2 diabetes mellitus with diabetic chronic kidney disease: Secondary | ICD-10-CM

## 2019-12-20 DIAGNOSIS — I129 Hypertensive chronic kidney disease with stage 1 through stage 4 chronic kidney disease, or unspecified chronic kidney disease: Secondary | ICD-10-CM

## 2019-12-20 NOTE — Patient Instructions (Signed)
Social Worker Visit Information  Goals we discussed today:  Goals Addressed            This Visit's Progress   . Collaborate with RN Care Manager to perform appropriate assessments to assist with care coordination needs       CARE PLAN ENTRY (see longitudinal plan of care for additional care plan information)  Current Barriers:  . Transportation . Limited access to caregiver . Inability to perform ADL's independently . Inability to perform IADL's independently . Chronic conditions including DM II, CKD III, and parenchymal renal HTN which increase patient risk for hospitalization  Social Work Clinical Goal(s):  [name] Over the next 60 days the patient will work with SW to identify transportation resources Goal Met . Over the next 120 days the patient will work with SW to address ongoing care coordination needs  CCM SW Interventions: Completed 12/20/19 . Successful outbound call placed to the patient to assess goal progression . Confirmed patient did receive handicap placard via mail . Discussed patient is interested in applying for CAP/DA services to obtain in home assistance for iADL's. o Advised the patient she may not qualify for CAP/DA considering she does not have Medicaid o Patient requests to complete application to determine eligibility o SW collaboration with Alicia Amel with CAP/DA office requesting an application be mailed to the patient for completion o Advised the patient to contact SW as needed for assistance . Educated the patient on the [facility] in home aid program under [employer] o Patient agreeable in SW referring patient to the program o Voice message left for [facility] requesting return call to place patient on wait list . SW to follow up with [employer] over the next 7 days if a return call is not received  Completed 12/07/19 . Successful outbound call placed to the patient to review goal progression . Determined the patient has successfully accessed [employer] for  transportation needs . Discussed the patient has yet to receive handicap placard to use when her daughter is transporting her . Advised the patient SW was previously informed the paperwork had be mailed to the patient in October . Collaboration with [name] CMA with primary provider office to request follow up . Scheduled follow up call over the next two weeks to provide patient with an update once received  Completed 11/11/19 with the patient . Received communication from [employer] with [employer] ([place]) indicating patient has been certified to access transportation services "Ms. Meetze has been certified. I will mail her certification packet next week, however she does not have to wait to make reservations. The reservations number is: [phone]." . Successful outbound call placed to the patient to inform of certification as well as to expect packet in the mail . Provided the patient with contact number to reserve transportation as needed prior to receiving mailed packet . Scheduled follow up over the next month   Completed 11/09/19 with the patient . Collaboration with [name] to assess outcome of patient transportation application to access [employer]) o Determined [name] has yet to process application but plans to process the application on 24/09 . Successful outbound call placed to the patient to discuss plan to follow up with [name] on 10/27 to determine patient application status . Determined the patient has contacted [employer] (whom she has been active with in the past) and secured transportation for 11/29 appointment . Advised the patient SW will follow up later this week to confirm  application approval/denial for [facility] Access  Completed 11/02/19 with the patient . Successful outbound call placed to the patient to assist with goal progression . Discussed SW submitted [employer] application on 28/2/06 to request patient be  approved to access transportation resource . Determined the patient has yet to receive communication regarding eligibility determination o Patient reports she has an [place] appointment on 11/12/19 which she will need transportation assistance for . Collaboration with [name] to request update on patients application status o Determined [name] has yet to process the patients application but feels she will have this completed in time for patients 10/29/ appointment . Successful outbound call placed  to the patient to advise of application status . Scheduled follow up call to the patient over the next week  Completed 10/26/19 with daughter [name] . Communication with patient daughter, [name], who requests SW assistance with obtaining a handicap placard to be used when transporting the patient . Advised [name] paperwork must be filled out by the patients provider . Collaboration with [name], CMA to request assistance with handicap placard paperwork  Completed 10/20/19 . Collaboration with [name] with [employer] ([facility]) who reports the patients eligibility expired in 2019 . Successful completion of Part A of [employer] application . Collaboration with Dr Baird Cancer to request Part B of [employer] application be completed on the patients behalf . SW to continue to follow  Completed 10/19/19 . Inter-disciplinary care team collaboration (see longitudinal plan of care) . Collaboration with RN Care Manager who indicated patient interest in transportation resources . Successful outbound call placed to the patient to assist with resource needs o Patient reports she has generally used health plan transportation services but she has run out of rides for the remainder of the plan year . Discussed opportunity to apply for [facility] Access ([facility]) o Patient reports she does not particularly like [employer] but is willing to try it again . Determined the patient has used this resource in the  past but is unsure if she is still considered an approved rider . Collaboration with [name] with [facility] Access to determine patient eligibility . Advised the patient SW will assist with a new application as needed pending response from [name] . Scheduled follow up over the next week  Patient Self Care Activities:  . Patient verbalizes understanding of plan to work with SW to address care coordination needs . Self administers medications as prescribed . Calls pharmacy for medication refills . Calls provider office for new concerns or questions . Unable to perform ADLs independently . Unable to perform IADLs independently  Please see past updates related to this goal by clicking on the "Past Updates" button in the selected goal          Follow Up Plan: SW will follow up with patient by phone over the next month.   [name], BSW, CDP Social Worker, Certified Dementia Practitioner [place] [facility] [place] [facility] [phone]

## 2019-12-20 NOTE — Chronic Care Management (AMB) (Addendum)
Chronic Care Management    Social Work Follow Up Note  12/20/2019 Name: Mary [name] MRN: [id number] DOB: 1945-01-30  Mary [name] is a 74 y.o. year old female who is a primary care patient of Glendale Chard, MD. The CCM team was consulted for assistance with care coordination.   Review of patient status, including review of consultants reports, other relevant assessments, and collaboration with appropriate care team members and the patient's provider was performed as part of comprehensive patient evaluation and provision of chronic care management services.    SDOH (Social Determinants of Health) assessments performed: No   Medications: Outpatient Encounter Medications as of 12/20/2019  Medication Sig Note  . Assure Comfort Lancets 28G MISC    . Blood Glucose Monitoring Suppl (ONETOUCH VERIO FLEX SYSTEM) w/Device KIT Use as directed to check blood sugars 2 times per day dx: e11.65   . cetirizine (ZYRTEC) 10 MG tablet TAKE 1 TABLET BY MOUTH EVERY DAY   . Dexchlorpheniramine Maleate (RYCLORA) 2 MG/5ML SOLN 5cc twice daily as needed   . dorzolamide-timolol (COSOPT) 22.3-6.8 MG/ML ophthalmic solution Place 1 drop into the right eye 2 (two) times daily.   . fluorometholone (FML) 0.1 % ophthalmic suspension Place 1 drop into the right eye 3 (three) times daily.    . fluticasone (FLONASE) 50 MCG/ACT nasal spray SPRAY 1 SPRAY INTO EACH NOSTRIL EVERY DAY   . furosemide (LASIX) 40 MG tablet Take 1 tablet (40 mg total) by mouth daily.   [name] glucose blood (ONETOUCH VERIO) test strip Use as directed to check blood sugars 2 times per day dx: e11.65   . HYDROcodone-acetaminophen (NORCO) 10-325 MG tablet Take 1 tablet by mouth 5 (five) times daily as needed.   [name] KLOR-CON M20 20 MEQ tablet Take 20 mEq by mouth daily. with food 02/23/2018: Patient instructed to hold this medication due to abnormal Potassium level of 6.2 on 02/18/18, per MD, Dr. Glendale Chard. Patient was given verbal education related to  complications from hypo and hyperkalemia and importance of following MD recommendations. Patient instructed to resume this medication when directed by PCP. Patient verbalizes understanding.   [name] levothyroxine (SYNTHROID) 125 MCG tablet Take 125 mcg by mouth daily before breakfast.   . magnesium oxide (MAG-OX) 400 MG tablet TAKE 1 TABLET BY MOUTH EVERY EVENING   . metoprolol succinate (TOPROL-XL) 50 MG 24 hr tablet    . metoprolol tartrate (LOPRESSOR) 50 MG tablet Take 50 mg by mouth 2 (two) times daily.    [name] omeprazole (PRILOSEC) 20 MG capsule TAKE 1 CAPSULE BY MOUTH  DAILY (Patient not taking: Reported on 11/25/2019)   . OneTouch Delica Lancets 38G MISC Use as directed to check blood sugars 2 times per day dx: e11.65   . pravastatin (PRAVACHOL) 40 MG tablet TAKE 1 TABLET BY MOUTH EVERY DAY   . prednisoLONE acetate (PRED FORTE) 1 % ophthalmic suspension Place 1 drop into the right eye 3 (three) times daily.    . predniSONE (DELTASONE) 10 MG tablet Take by mouth.    . telmisartan (MICARDIS) 80 MG tablet TAKE 1 TABLET BY MOUTH  DAILY   . triamcinolone (KENALOG) 0.1 % APPLY TO AFFECTED AREA TWICE A DAY    No facility-administered encounter medications on file as of 12/20/2019.     Objective:   Goals Addressed            This Visit's Progress   . Collaborate with RN Care Manager to perform appropriate assessments to assist with care coordination  needs       CARE PLAN ENTRY (see longitudinal plan of care for additional care plan information)  Current Barriers:  . Transportation . Limited access to caregiver . Inability to perform ADL's independently . Inability to perform IADL's independently . Chronic conditions including DM II, CKD III, and parenchymal renal HTN which increase patient risk for hospitalization  Social Work Clinical Goal(s):  [name] Over the next 60 days the patient will work with SW to identify transportation resources Goal Met . Over the next 120 days the patient will work  with SW to address ongoing care coordination needs  CCM SW Interventions: Completed 12/20/19 . Successful outbound call placed to the patient to assess goal progression . Confirmed patient did receive handicap placard via mail . Discussed patient is interested in applying for CAP/DA services to obtain in home assistance for iADL's. o Advised the patient she may not qualify for CAP/DA considering she does not have Medicaid o Patient requests to complete application to determine eligibility o SW collaboration with Alicia Amel with CAP/DA office requesting an application be mailed to the patient for completion o Advised the patient to contact SW as needed for assistance . Educated the patient on the [facility] in home aid program under [employer] o Patient agreeable in SW referring patient to the program o Voice message left for [employer] requesting return call to place patient on wait list . SW to follow up with [facility] over the next 7 days if a return call is not received 1:44 pm . Inbound call received from Ms. Hunt with [facility] in home aid program . Patient successfully placed on wait list - Ms. Hunt reports this wait list is approximately 1-2 years long  Completed 12/07/19 . Successful outbound call placed to the patient to review goal progression . Determined the patient has successfully accessed [employer] for transportation needs . Discussed the patient has yet to receive handicap placard to use when her daughter is transporting her . Advised the patient SW was previously informed the paperwork had be mailed to the patient in October . Collaboration with [name] CMA with primary provider office to request follow up . Scheduled follow up call over the next two weeks to provide patient with an update once received  Completed 11/11/19 with the patient . Received communication from [employer] with [employer] ([place]) indicating patient has been certified to access transportation  services "Ms. Sowder has been certified. I will mail her certification packet next week, however she does not have to wait to make reservations. The reservations number is: [phone]." . Successful outbound call placed to the patient to inform of certification as well as to expect packet in the mail . Provided the patient with contact number to reserve transportation as needed prior to receiving mailed packet . Scheduled follow up over the next month   Completed 11/09/19 with the patient . Collaboration with [name] to assess outcome of patient transportation application to access [employer] (formerly [employer]) o Determined [name] has yet to process application but plans to process the application on 12/24 . Successful outbound call placed to the patient to discuss plan to follow up with [name] on 10/27 to determine patient application status . Determined the patient has contacted [employer] (whom she has been active with in the past) and secured transportation for 11/29 appointment . Advised the patient SW will follow up later this week to confirm application approval/denial for [facility] Access  Completed 11/02/19  with the patient . Successful outbound call placed to the patient to assist with goal progression . Discussed SW submitted [employer] application on 66/0/63 to request patient be approved to access transportation resource . Determined the patient has yet to receive communication regarding eligibility determination o Patient reports she has an [place] appointment on 11/12/19 which she will need transportation assistance for . Collaboration with [name] to request update on patients application status o Determined [name] has yet to process the patients application but feels she will have this completed in time for patients 10/29/ appointment . Successful outbound call placed  to the patient to advise of application status . Scheduled follow up call to  the patient over the next week  Completed 10/26/19 with daughter [name] . Communication with patient daughter, [name], who requests SW assistance with obtaining a handicap placard to be used when transporting the patient . Advised [name] paperwork must be filled out by the patients provider . Collaboration with [name], CMA to request assistance with handicap placard paperwork  Completed 10/20/19 . Collaboration with [name] with [employer] ([employer]) who reports the patients eligibility expired in 2019 . Successful completion of Part A of [employer] application . Collaboration with Dr Baird Cancer to request Part B of [employer] application be completed on the patients behalf . SW to continue to follow  Completed 10/19/19 . Inter-disciplinary care team collaboration (see longitudinal plan of care) . Collaboration with RN Care Manager who indicated patient interest in transportation resources . Successful outbound call placed to the patient to assist with resource needs o Patient reports she has generally used health plan transportation services but she has run out of rides for the remainder of the plan year . Discussed opportunity to apply for [facility] Access ([facility]) o Patient reports she does not particularly like [employer] but is willing to try it again . Determined the patient has used this resource in the past but is unsure if she is still considered an approved rider . Collaboration with [name] with [facility] Access to determine patient eligibility . Advised the patient SW will assist with a new application as needed pending response from [name] . Scheduled follow up over the next week  Patient Self Care Activities:  . Patient verbalizes understanding of plan to work with SW to address care coordination needs . Self administers medications as prescribed . Calls pharmacy for medication refills . Calls provider office for new concerns or questions . Unable to perform ADLs  independently . Unable to perform IADLs independently  Please see past updates related to this goal by clicking on the "Past Updates" button in the selected goal           Follow Up Plan: SW will follow up with patient by phone over the next month.   [name], BSW, CDP Social Worker, Certified Dementia Practitioner [place] [facility] [place] [facility] [phone]  Total time spent performing care coordination and/or care management activities with the patient by phone or face to face = 12 minutes.

## 2019-12-22 DIAGNOSIS — E1122 Type 2 diabetes mellitus with diabetic chronic kidney disease: Secondary | ICD-10-CM | POA: Diagnosis not present

## 2019-12-22 DIAGNOSIS — I129 Hypertensive chronic kidney disease with stage 1 through stage 4 chronic kidney disease, or unspecified chronic kidney disease: Secondary | ICD-10-CM | POA: Diagnosis not present

## 2019-12-22 DIAGNOSIS — Z96651 Presence of right artificial knee joint: Secondary | ICD-10-CM | POA: Diagnosis not present

## 2019-12-22 DIAGNOSIS — M1712 Unilateral primary osteoarthritis, left knee: Secondary | ICD-10-CM | POA: Diagnosis not present

## 2019-12-22 DIAGNOSIS — N183 Chronic kidney disease, stage 3 unspecified: Secondary | ICD-10-CM | POA: Diagnosis not present

## 2019-12-22 DIAGNOSIS — Z9181 History of falling: Secondary | ICD-10-CM | POA: Diagnosis not present

## 2019-12-22 DIAGNOSIS — Z7982 Long term (current) use of aspirin: Secondary | ICD-10-CM | POA: Diagnosis not present

## 2019-12-22 NOTE — Progress Notes (Shared)
[facility] Note  12/24/2019     CHIEF COMPLAINT Patient presents for No chief complaint on file.   HISTORY OF PRESENT ILLNESS: Mary [name] is a 74 y.o. female who presents to the clinic today for:   pt states no change in vision, she denies floaters   Referring physician: Glendale Chard, [address] [place],  [place] [address]  HISTORICAL INFORMATION:   Selected notes from the [place] Transferred care from Dr. Zigmund Daniel   CURRENT MEDICATIONS: Current Outpatient Medications (Ophthalmic Drugs)  Medication Sig  . dorzolamide-timolol (COSOPT) 22.3-6.8 MG/ML ophthalmic solution Place 1 drop into the right eye 2 (two) times daily.  . fluorometholone (FML) 0.1 % ophthalmic suspension Place 1 drop into the right eye 3 (three) times daily.   . prednisoLONE acetate (PRED FORTE) 1 % ophthalmic suspension Place 1 drop into the right eye 3 (three) times daily.    No current facility-administered medications for this visit. (Ophthalmic Drugs)   Current Outpatient Medications (Other)  Medication Sig  . Assure Comfort Lancets 28G MISC   . Blood Glucose Monitoring Suppl (ONETOUCH VERIO FLEX SYSTEM) w/Device KIT Use as directed to check blood sugars 2 times per day dx: e11.65  . cetirizine (ZYRTEC) 10 MG tablet TAKE 1 TABLET BY MOUTH EVERY DAY  . Dexchlorpheniramine Maleate (RYCLORA) 2 MG/5ML SOLN 5cc twice daily as needed  . fluticasone (FLONASE) 50 MCG/ACT nasal spray SPRAY 1 SPRAY INTO EACH NOSTRIL EVERY DAY  . furosemide (LASIX) 40 MG tablet Take 1 tablet (40 mg total) by mouth daily.  [name] glucose blood (ONETOUCH VERIO) test strip Use as directed to check blood sugars 2 times per day dx: e11.65  . HYDROcodone-acetaminophen (NORCO) 10-325 MG tablet Take 1 tablet by mouth 5 (five) times daily as needed.  [name] KLOR-CON M20 20 MEQ tablet Take 20 mEq by mouth daily. with food  . levothyroxine (SYNTHROID) 125 MCG tablet Take 125 mcg  by mouth daily before breakfast.  . magnesium oxide (MAG-OX) 400 MG tablet TAKE 1 TABLET BY MOUTH EVERY EVENING  . metoprolol succinate (TOPROL-XL) 50 MG 24 hr tablet   . metoprolol tartrate (LOPRESSOR) 50 MG tablet Take 50 mg by mouth 2 (two) times daily.   [name] omeprazole (PRILOSEC) 20 MG capsule TAKE 1 CAPSULE BY MOUTH  DAILY (Patient not taking: Reported on 11/25/2019)  . OneTouch Delica Lancets 31S MISC Use as directed to check blood sugars 2 times per day dx: e11.65  . pravastatin (PRAVACHOL) 40 MG tablet TAKE 1 TABLET BY MOUTH EVERY DAY  . predniSONE (DELTASONE) 10 MG tablet Take by mouth.   . telmisartan (MICARDIS) 80 MG tablet TAKE 1 TABLET BY MOUTH  DAILY  . triamcinolone (KENALOG) 0.1 % APPLY TO AFFECTED AREA TWICE A DAY   No current facility-administered medications for this visit. (Other)      REVIEW OF SYSTEMS:    ALLERGIES No Known Allergies  PAST MEDICAL HISTORY Past Medical History:  Diagnosis Date  . Arthritis    osteoarthritis  . Cataract    OS  . Diabetes mellitus    Insulin dependent  . Diabetic retinopathy ([place])    NPDR OU  . Glaucoma    POAG OD  . H/O cardiovascular stress test    pt. reports 10/13- was normal per pt.   . Hyperlipemia   . Hypertension   . Hypertensive retinopathy    OU   Past Surgical History:  Procedure Laterality Date  .  CARPAL TUNNEL RELEASE Bilateral   . CATARACT EXTRACTION Right 10/05/2018   Dr. Kathlen Mody  . EYE SURGERY Right    Cat Sx  . KNEE ARTHROSCOPY     right  . QUADRICEPS TENDON REPAIR  02/17/2012   Procedure: REPAIR QUADRICEP TENDON;  Surgeon: Vickey Huger, MD;  Location: [place];  Service: Orthopedics;  Laterality: Right;  right quadriceps repair with latera release  . QUADRICEPS TENDON REPAIR Right 05/15/2012   Procedure: REPAIR QUADRICEP TENDON;  Surgeon: Vickey Huger, MD;  Location: [facility];  Service: Orthopedics;  Laterality: Right;  . TOTAL KNEE ARTHROPLASTY  10/21/2011   rt tk  . TOTAL KNEE ARTHROPLASTY  10/21/2011    Procedure: TOTAL KNEE ARTHROPLASTY;  Surgeon: Rudean Haskell, MD;  Location: [place];  Service: Orthopedics;  Laterality: Right;  . TUBAL LIGATION      FAMILY HISTORY Family History  Problem Relation Age of Onset  . Hypertension Mother   . Heart Problems Father   . Gout Father   . Arthritis Father     SOCIAL HISTORY Social History   Tobacco Use  . Smoking status: Former Smoker    Packs/day: 0.25    Years: 20.00    Pack years: 5.00    Types: Cigarettes    Quit date: 06/28/2014    Years since quitting: 5.4  . Smokeless tobacco: Never Used  Vaping Use  . Vaping Use: Never used  Substance Use Topics  . Alcohol use: No  . Drug use: Yes    Types: Hydrocodone         OPHTHALMIC EXAM:  Not recorded     IMAGING AND PROCEDURES  Imaging and Procedures for _0 @           ASSESSMENT/PLAN:    ICD-10-CM   1. Central retinal vein occlusion with macular edema of both eyes  H34.8130   2. Moderate nonproliferative diabetic retinopathy of both eyes with macular edema associated with type 2 diabetes mellitus ([place])  C58.5277   3. Retinal edema  H35.81 OCT, Retina - OU - Both Eyes  4. Essential hypertension  I10   5. Hypertensive retinopathy of both eyes  H35.033   6. Combined forms of age-related cataract of left eye  H25.812   7. Pseudophakia  Z96.1   8. Primary open angle glaucoma (POAG) of right eye, mild stage  H40.1111    1. CRVO w/ CME OU  - pt of Dr. Zigmund Daniel who wished to transfer care  - OS long-standing low vision (HM) with subfoveal scar and central retinal atrophy  - OD with CME actively being managed with Ozurdex OD ~q5 wks by Dr. Zigmund Daniel (last on 8.20.20) s/p multiple IVTA and Ozurdex OD  - was due for Ozurdex OD w/ Zigmund Daniel on 9.25.20, but underwent cataract surgery OD w/ Kathlen Mody on 9.21.20  - s/p IVTA OD #4 (10.26.20), #5 (12.10.20), #6 (01.21.21), #7 (03.05.21), #8 (04.16.21), #9 (05.28.21), #10 (07.09.21), #11 (09.14.21), #12 (10.29.21)  - BCVA  stable at 20/80 OD  - exam shows no residual IVTA in vitreous cavity  - OCT shows interval improvement in IRF ST macula and fovea  - recommend IVTA OD #13 today, 12.10.21  - pt wishes to proceed  - RBA of procedure discussed, questions answered  - informed consent obtained  - see procedure note -- AC tap  - f/u in 6 wks -- DFE/OCT/possible injection  2,3. Severe Non-proliferative diabetic retinopathy OU.  - OCT shows diabetic macular edema, OD   - recommend IVTA  OD today, 09.14.21 as above for CRVO  4,5. Hypertensive retinopathy OU  - discussed importance of tight BP control  - monitor  6. Mixed form age related cataract OS  - under the expert management of Dr. Kathlen Mody  7. Pseudophakia OD  - s/p CE/IOL OD (9.21.20) by expert surgeon, Dr. Kathlen Mody  - IOL in excellent position  - had post op IOP spike -- IOP now under better control  - monitor  8. POAG OD  - under the expert management of Dr. Kathlen Mody  - s/p SLT and goniotomy OD  - had post-cataract IOP spike  - IOP 14 today  - AC tap post IVTA injxn  Ophthalmic Meds Ordered this visit:  No orders of the defined types were placed in this encounter.      No follow-ups on file.  There are no Patient Instructions on file for this visit.   Explained the diagnoses, plan, and follow up with the patient and they expressed understanding.  Patient expressed understanding of the importance of proper follow up care.   This document serves as a record of services personally performed by Gardiner Sleeper, MD, PhD. It was created on their behalf by San Jetty. Owens Shark, OA an ophthalmic technician. The creation of this record is the provider's dictation and/or activities during the visit.    Electronically signed by: San Jetty. Owens Shark, [place] 12.08.2021 8:23 AM  Gardiner Sleeper, M.D., Ph.D. Diseases & Surgery of the Retina and Vitreous [facility]: M myopia (nearsighted); A astigmatism; H hyperopia  (farsighted); P presbyopia; Mrx spectacle prescription;  CTL contact lenses; OD right eye; OS left eye; OU both eyes  XT exotropia; ET esotropia; PEK punctate epithelial keratitis; PEE punctate epithelial erosions; DES dry eye syndrome; MGD meibomian gland dysfunction; ATs artificial tears; PFAT's preservative free artificial tears; [place] nuclear sclerotic cataract; PSC posterior subcapsular cataract; ERM epi-retinal membrane; PVD posterior vitreous detachment; RD retinal detachment; DM diabetes mellitus; DR diabetic retinopathy; NPDR non-proliferative diabetic retinopathy; PDR proliferative diabetic retinopathy; CSME clinically significant macular edema; DME diabetic macular edema; dbh dot blot hemorrhages; CWS cotton wool spot; POAG primary open angle glaucoma; C/D cup-to-disc ratio; HVF humphrey visual field; GVF goldmann visual field; OCT optical coherence tomography; IOP intraocular pressure; BRVO Branch retinal vein occlusion; CRVO central retinal vein occlusion; CRAO central retinal artery occlusion; BRAO branch retinal artery occlusion; RT retinal tear; SB scleral buckle; PPV pars plana vitrectomy; VH Vitreous hemorrhage; PRP panretinal laser photocoagulation; IVK intravitreal kenalog; VMT vitreomacular traction; MH Macular hole;  NVD neovascularization of the disc; NVE neovascularization elsewhere; AREDS age related eye disease study; ARMD age related macular degeneration; POAG primary open angle glaucoma; EBMD epithelial/anterior basement membrane dystrophy; ACIOL anterior chamber intraocular lens; IOL intraocular lens; PCIOL posterior chamber intraocular lens; Phaco/IOL phacoemulsification with intraocular lens placement; [place] photorefractive keratectomy; LASIK laser assisted in situ keratomileusis; HTN hypertension; DM diabetes mellitus; COPD chronic obstructive pulmonary disease

## 2019-12-23 ENCOUNTER — Telehealth: Payer: Medicare Other

## 2019-12-24 ENCOUNTER — Encounter (INDEPENDENT_AMBULATORY_CARE_PROVIDER_SITE_OTHER): Payer: Medicare Other | Admitting: Ophthalmology

## 2019-12-24 DIAGNOSIS — H3581 Retinal edema: Secondary | ICD-10-CM

## 2019-12-24 DIAGNOSIS — H401111 Primary open-angle glaucoma, right eye, mild stage: Secondary | ICD-10-CM

## 2019-12-24 DIAGNOSIS — H35033 Hypertensive retinopathy, bilateral: Secondary | ICD-10-CM

## 2019-12-24 DIAGNOSIS — E113313 Type 2 diabetes mellitus with moderate nonproliferative diabetic retinopathy with macular edema, bilateral: Secondary | ICD-10-CM

## 2019-12-24 DIAGNOSIS — I10 Essential (primary) hypertension: Secondary | ICD-10-CM

## 2019-12-24 DIAGNOSIS — Z961 Presence of intraocular lens: Secondary | ICD-10-CM

## 2019-12-24 DIAGNOSIS — H25812 Combined forms of age-related cataract, left eye: Secondary | ICD-10-CM

## 2019-12-24 DIAGNOSIS — H34813 Central retinal vein occlusion, bilateral, with macular edema: Secondary | ICD-10-CM

## 2019-12-28 ENCOUNTER — Telehealth: Payer: Medicare Other

## 2019-12-28 ENCOUNTER — Other Ambulatory Visit: Payer: Self-pay

## 2019-12-28 ENCOUNTER — Ambulatory Visit: Payer: Self-pay

## 2019-12-28 DIAGNOSIS — E039 Hypothyroidism, unspecified: Secondary | ICD-10-CM

## 2019-12-28 DIAGNOSIS — I129 Hypertensive chronic kidney disease with stage 1 through stage 4 chronic kidney disease, or unspecified chronic kidney disease: Secondary | ICD-10-CM

## 2019-12-28 DIAGNOSIS — R600 Localized edema: Secondary | ICD-10-CM

## 2019-12-28 DIAGNOSIS — E1122 Type 2 diabetes mellitus with diabetic chronic kidney disease: Secondary | ICD-10-CM

## 2019-12-31 ENCOUNTER — Encounter (INDEPENDENT_AMBULATORY_CARE_PROVIDER_SITE_OTHER): Payer: Self-pay | Admitting: Ophthalmology

## 2019-12-31 ENCOUNTER — Ambulatory Visit (INDEPENDENT_AMBULATORY_CARE_PROVIDER_SITE_OTHER): Payer: Medicare Other | Admitting: Ophthalmology

## 2019-12-31 ENCOUNTER — Other Ambulatory Visit: Payer: Self-pay

## 2019-12-31 DIAGNOSIS — I10 Essential (primary) hypertension: Secondary | ICD-10-CM

## 2019-12-31 DIAGNOSIS — H3581 Retinal edema: Secondary | ICD-10-CM | POA: Diagnosis not present

## 2019-12-31 DIAGNOSIS — H35033 Hypertensive retinopathy, bilateral: Secondary | ICD-10-CM | POA: Diagnosis not present

## 2019-12-31 DIAGNOSIS — H25812 Combined forms of age-related cataract, left eye: Secondary | ICD-10-CM

## 2019-12-31 DIAGNOSIS — H34813 Central retinal vein occlusion, bilateral, with macular edema: Secondary | ICD-10-CM | POA: Diagnosis not present

## 2019-12-31 DIAGNOSIS — E113313 Type 2 diabetes mellitus with moderate nonproliferative diabetic retinopathy with macular edema, bilateral: Secondary | ICD-10-CM | POA: Diagnosis not present

## 2019-12-31 DIAGNOSIS — Z961 Presence of intraocular lens: Secondary | ICD-10-CM

## 2019-12-31 DIAGNOSIS — H401111 Primary open-angle glaucoma, right eye, mild stage: Secondary | ICD-10-CM

## 2019-12-31 MED ORDER — TRIAMCINOLONE ACETONIDE 40 MG/ML IO SUSP
4.0000 mg | INTRAOCULAR | Status: AC | PRN
  Administered 2019-12-31: 4 mg via INTRAVITREAL

## 2019-12-31 NOTE — Progress Notes (Signed)
[facility] Note  12/31/2019     CHIEF COMPLAINT Patient presents for Retina Follow Up   HISTORY OF PRESENT ILLNESS: Mary [name] is a 74 y.o. female who presents to the clinic today for:   HPI    Retina Follow Up    Patient presents with  CRVO/BRVO.  In both eyes.  Duration of 7 weeks.  Since onset it is stable.  I, the attending physician,  performed the HPI with the patient and updated documentation appropriately.          Comments    7 week follow up CRVO OU- Vision stable OU.  Eyes are tearing a lot OU.  Patient is using Dorz/Timolol BID OD.  BS 98 Wednesday.  A1C 5.4       Last edited by Bernarda Caffey, MD on 12/31/2019  9:55 AM. (History)    pt delayed to 7 wks -- had to reschedule last week due to diarrhea   Referring physician: Glendale Chard, MD [address] [place],  [place] [address]  HISTORICAL INFORMATION:   Selected notes from the [facility] Transferred care from Dr. Zigmund Daniel   CURRENT MEDICATIONS: Current Outpatient Medications (Ophthalmic Drugs)  Medication Sig  . dorzolamide-timolol (COSOPT) 22.3-6.8 MG/ML ophthalmic solution Place 1 drop into the right eye 2 (two) times daily.  . fluorometholone (FML) 0.1 % ophthalmic suspension Place 1 drop into the right eye 3 (three) times daily.  (Patient not taking: Reported on 12/31/2019)  . prednisoLONE acetate (PRED FORTE) 1 % ophthalmic suspension Place 1 drop into the right eye 3 (three) times daily.  (Patient not taking: Reported on 12/31/2019)   No current facility-administered medications for this visit. (Ophthalmic Drugs)   Current Outpatient Medications (Other)  Medication Sig  . cetirizine (ZYRTEC) 10 MG tablet TAKE 1 TABLET BY MOUTH EVERY DAY  . Dexchlorpheniramine Maleate (RYCLORA) 2 MG/5ML SOLN 5cc twice daily as needed  . fluticasone (FLONASE) 50 MCG/ACT nasal spray SPRAY 1 SPRAY INTO EACH NOSTRIL EVERY DAY  . furosemide (LASIX) 40 MG tablet  Take 1 tablet (40 mg total) by mouth daily.  [name] HYDROcodone-acetaminophen (NORCO) 10-325 MG tablet Take 1 tablet by mouth 5 (five) times daily as needed.  [name] levothyroxine (SYNTHROID) 125 MCG tablet Take 125 mcg by mouth daily before breakfast.  . magnesium oxide (MAG-OX) 400 MG tablet TAKE 1 TABLET BY MOUTH EVERY EVENING  . metoprolol succinate (TOPROL-XL) 50 MG 24 hr tablet   . metoprolol tartrate (LOPRESSOR) 50 MG tablet Take 50 mg by mouth 2 (two) times daily.   . predniSONE (DELTASONE) 10 MG tablet Take by mouth.   . telmisartan (MICARDIS) 80 MG tablet TAKE 1 TABLET BY MOUTH  DAILY  . triamcinolone (KENALOG) 0.1 % APPLY TO AFFECTED AREA TWICE A DAY  . Assure Comfort Lancets 28G MISC   . Blood Glucose Monitoring Suppl (ONETOUCH VERIO FLEX SYSTEM) w/Device KIT Use as directed to check blood sugars 2 times per day dx: e11.65  . glucose blood (ONETOUCH VERIO) test strip Use as directed to check blood sugars 2 times per day dx: e11.65  . KLOR-CON M20 20 MEQ tablet Take 20 mEq by mouth daily. with food (Patient not taking: Reported on 12/31/2019)  . omeprazole (PRILOSEC) 20 MG capsule TAKE 1 CAPSULE BY MOUTH  DAILY (Patient not taking: No sig reported)  . OneTouch Delica Lancets 63W MISC Use as directed to check blood sugars 2 times per day dx: e11.65  .  pravastatin (PRAVACHOL) 40 MG tablet TAKE 1 TABLET BY MOUTH EVERY DAY   No current facility-administered medications for this visit. (Other)      REVIEW OF SYSTEMS: ROS    Positive for: Musculoskeletal, Endocrine, Eyes   Negative for: Constitutional, Gastrointestinal, Neurological, Skin, Genitourinary, HENT, Cardiovascular, Respiratory, Psychiatric, Allergic/Imm, Heme/Lymph   Last edited by Leonie Douglas, COA on 12/31/2019  8:53 AM. (History)       ALLERGIES No Known Allergies  PAST MEDICAL HISTORY Past Medical History:  Diagnosis Date  . Arthritis    osteoarthritis  . Cataract    OS  . Diabetes mellitus    Insulin dependent   . Diabetic retinopathy ([place])    NPDR OU  . Glaucoma    POAG OD  . H/O cardiovascular stress test    pt. reports 10/13- was normal per pt.   . Hyperlipemia   . Hypertension   . Hypertensive retinopathy    OU   Past Surgical History:  Procedure Laterality Date  . CARPAL TUNNEL RELEASE Bilateral   . CATARACT EXTRACTION Right 10/05/2018   Dr. Kathlen Mody  . EYE SURGERY Right    Cat Sx  . KNEE ARTHROSCOPY     right  . QUADRICEPS TENDON REPAIR  02/17/2012   Procedure: REPAIR QUADRICEP TENDON;  Surgeon: Vickey Huger, MD;  Location: [facility];  Service: Orthopedics;  Laterality: Right;  right quadriceps repair with latera release  . QUADRICEPS TENDON REPAIR Right 05/15/2012   Procedure: REPAIR QUADRICEP TENDON;  Surgeon: Vickey Huger, MD;  Location: [facility];  Service: Orthopedics;  Laterality: Right;  . TOTAL KNEE ARTHROPLASTY  10/21/2011   rt tk  . TOTAL KNEE ARTHROPLASTY  10/21/2011   Procedure: TOTAL KNEE ARTHROPLASTY;  Surgeon: Rudean Haskell, MD;  Location: [facility];  Service: Orthopedics;  Laterality: Right;  . TUBAL LIGATION      FAMILY HISTORY Family History  Problem Relation Age of Onset  . Hypertension Mother   . Heart Problems Father   . Gout Father   . Arthritis Father     SOCIAL HISTORY Social History   Tobacco Use  . Smoking status: Former Smoker    Packs/day: 0.25    Years: 20.00    Pack years: 5.00    Types: Cigarettes    Quit date: 06/28/2014    Years since quitting: 5.5  . Smokeless tobacco: Never Used  Vaping Use  . Vaping Use: Never used  Substance Use Topics  . Alcohol use: No  . Drug use: Yes    Types: Hydrocodone         OPHTHALMIC EXAM:  Base Eye Exam    Visual Acuity (Snellen - Linear)      Right Left   Dist cc 20/60 -2 HM   Dist ph cc 20/50 -2        Tonometry (Tonopen, 9:03 AM)      Right Left   Pressure 16 15       Pupils      Dark Light Shape React APD   Right 3 2 Round Minimal None   Left 3 2 Round Minimal None       Visual Fields  (Counting fingers)      Left Right     Full   Restrictions Total superior temporal, inferior temporal, superior nasal, inferior nasal deficiencies        Extraocular Movement      Right Left     LXT    0 0 0  0  0  0 0 0   -- -- --  --  --  -- -- --         Neuro/Psych    Oriented x3: Yes   Mood/Affect: Normal       Dilation    Both eyes: 1.0% Mydriacyl, 2.5% Phenylephrine @ 9:03 AM        Slit Lamp and Fundus Exam    Slit Lamp Exam      Right Left   Lids/Lashes Dermatochalasis - upper lid, mild Meibomian gland dysfunction Dermatochalasis - upper lid, mild Meibomian gland dysfunction   Conjunctiva/Sclera Melanosis Melanosis   Cornea 2-3+ Punctate epithelial erosions, well healed temporal cataract wounds 3+ Punctate epithelial erosions, well healed temporal cataract wounds   Anterior Chamber Deep and quiet Deep and quiet   Iris Round and moderately dilated to 5.71m Round and moderately dilated to 610m  Lens PC IOL in good position, trace, cortical remnant temporally, PC folds 2-3+ Nuclear sclerosis, 2-3+ Cortical cataract   Vitreous Vitreous syneresis, vitreous condensations, Ozurdex pellet remnants inferiorly, no residual IVT Vitreous syneresis       Fundus Exam      Right Left   Disc  2-3+Pallor, Sharp rim, temporal PPP Mild Pallor, Sharp rim, PPP   C/D Ratio 0.5 0.3   Macula Blunted foveal reflex, Drusen, Retinal pigment epithelial mottling, temporal edema with focal exudates-- slightly improved Flat, Blunted foveal reflex, RPE mottling and clumping, scarring, atrophy, no heme   Vessels Vascular attenuation, Tortuous Severe Vascular attenuation, +fibrosis   Periphery Attached    Attached, pavingstone and reticular degeneration inferiorly, scattered RPE atrophy          IMAGING AND PROCEDURES  Imaging and Procedures for @TODAY @  OCT, Retina - OU - Both Eyes       Right Eye Quality was good. Central Foveal Thickness: 250. Progression has improved. Findings  include abnormal foveal contour, intraretinal fluid, intraretinal hyper-reflective material, no SRF, vitreomacular adhesion  (Mild interval decrease in IRF ST macula).   Left Eye Quality was good. Central Foveal Thickness: 223. Progression has been stable. Findings include abnormal foveal contour, no IRF, no SRF, outer retinal atrophy, epiretinal membrane, preretinal fibrosis, subretinal hyper-reflective material, pigment epithelial detachment (Stable from prior).   Notes *Images captured and stored on drive  Diagnosis / Impression:  OD: CRVO w/ mild interval decrease in IRF ST macula OS: diffuse ORA - Stable from prior  Clinical management:  See below  Abbreviations: NFP - Normal foveal profile. CME - cystoid macular edema. PED - pigment epithelial detachment. IRF - intraretinal fluid. SRF - subretinal fluid. EZ - ellipsoid zone. ERM - epiretinal membrane. ORA - outer retinal atrophy. ORT - outer retinal tubulation. SRHM - subretinal hyper-reflective material        Intravitreal Injection, Pharmacologic Agent - OD - Right Eye       Time Out 12/31/2019. 10:03 AM. Confirmed correct patient, procedure, site, and patient consented.   Anesthesia Topical anesthesia was used. Anesthetic medications included Lidocaine 2%, Proparacaine 0.5%.   Procedure Preparation included 5% betadine to ocular surface, eyelid speculum. A 27 gauge needle was used.   Injection:  4 mg Triescence 4079mlL injection   NDC: [phone]: 10E2N, Expiration date: 10/13/2020   Route: Intravitreal, Site: Right Eye, Waste: 0.9 mL  Post-op Post injection exam found visual acuity of at least counting fingers. The patient tolerated the procedure well. There were no complications. The patient received written and verbal post procedure care education.  Notes An AC tap was performed following injection due to elevated IOP using a 30 gauge needle on a syringe with the plunger removed. The needle was placed at  the limbus at 5 oclock and approximately 0.07 cc of aqueous was removed from the anterior chamber. Betadine was applied to the tap area before and after the paracentesis was performed. There were no complications. The patient tolerated the procedure well. The IOP was rechecked and was found to be ~10 mmHg by digital palpation.                 ASSESSMENT/PLAN:    ICD-10-CM   1. Central retinal vein occlusion with macular edema of both eyes  H34.8130 Intravitreal Injection, Pharmacologic Agent - OD - Right Eye    triamcinolone acetonide (TRIESENCE) 40 MG/ML subtenons injection 4 mg    CANCELED: Injection into Tenon's Capsule - OD - Right Eye  2. Moderate nonproliferative diabetic retinopathy of both eyes with macular edema associated with type 2 diabetes mellitus ([place])  M57.8469   3. Retinal edema  H35.81 OCT, Retina - OU - Both Eyes  4. Essential hypertension  I10   5. Hypertensive retinopathy of both eyes  H35.033   6. Combined forms of age-related cataract of left eye  H25.812   7. Pseudophakia  Z96.1   8. Primary open angle glaucoma (POAG) of right eye, mild stage  H40.1111    1. CRVO w/ CME OU  - pt of Dr. Zigmund Daniel who wished to transfer care  - OS long-standing low vision (HM) with subfoveal scar and central retinal atrophy  - OD with CME actively being managed with Ozurdex OD ~q5 wks by Dr. Zigmund Daniel (last on 8.20.20) s/p multiple IVTA and Ozurdex OD  - was due for Ozurdex OD w/ Zigmund Daniel on 9.25.20, but underwent cataract surgery OD w/ Kathlen Mody on 9.21.20  - s/p IVTA OD #4 (10.26.20), #5 (12.10.20), #6 (01.21.21), #7 (03.05.21), #8 (04.16.21), #9 (05.28.21), #10 (07.09.21), #11 (09.14.21), #12 (10.29.21)  - BCVA 20/50 from 20/80 OD  - exam shows no residual IVTA in vitreous cavity  - OCT shows interval decrease in IRF ST macula and fovea despite 7 wk f/u  - recommend IVTA OD #13 today, 12.17.21  - pt wishes to proceed  - RBA of procedure discussed, questions answered  -  informed consent obtained  - see procedure note -- AC tap  - f/u in 6 wks -- DFE/OCT/possible injection  2,3. Severe Non-proliferative diabetic retinopathy OU.  - OCT shows diabetic macular edema, OD   - recommend IVTA OD today, 12.17.21 as above for CRVO  4,5. Hypertensive retinopathy OU  - discussed importance of tight BP control  - monitor  6. Mixed form age related cataract OS  - under the expert management of Dr. Kathlen Mody  7. Pseudophakia OD  - s/p CE/IOL OD (9.21.20) by expert surgeon, Dr. Kathlen Mody  - IOL in excellent position  - had post op IOP spike -- IOP now under better control  - monitor  8. POAG OD  - under the expert management of Dr. Kathlen Mody  - s/p SLT and goniotomy OD  - had post-cataract IOP spike  - IOP 16 today  - AC tap post IVTA injxn  Ophthalmic Meds Ordered this visit:  Meds ordered this encounter  Medications  . triamcinolone acetonide (TRIESENCE) 40 MG/ML subtenons injection 4 mg       Return in about 6 weeks (around 02/11/2020).  There are no Patient Instructions on file for  this visit.   Explained the diagnoses, plan, and follow up with the patient and they expressed understanding.  Patient expressed understanding of the importance of proper follow up care.   This document serves as a record of services personally performed by Gardiner Sleeper, MD, PhD. It was created on their behalf by San Jetty. Owens Shark, OA an ophthalmic technician. The creation of this record is the provider's dictation and/or activities during the visit.    Electronically signed by: San Jetty. Owens Shark, [place] 12.17.2021 1:09 PM  Gardiner Sleeper, M.D., Ph.D. Diseases & Surgery of the Retina and Vitreous [facility]  I have reviewed the above documentation for accuracy and completeness, and I agree with the above. Gardiner Sleeper, M.D., Ph.D. 12/31/19 1:09 PM   Abbreviations: M myopia (nearsighted); A astigmatism; H hyperopia (farsighted); P presbyopia; Mrx  spectacle prescription;  CTL contact lenses; OD right eye; OS left eye; OU both eyes  XT exotropia; ET esotropia; PEK punctate epithelial keratitis; PEE punctate epithelial erosions; DES dry eye syndrome; MGD meibomian gland dysfunction; ATs artificial tears; PFAT's preservative free artificial tears; [place] nuclear sclerotic cataract; PSC posterior subcapsular cataract; ERM epi-retinal membrane; PVD posterior vitreous detachment; RD retinal detachment; DM diabetes mellitus; DR diabetic retinopathy; NPDR non-proliferative diabetic retinopathy; PDR proliferative diabetic retinopathy; CSME clinically significant macular edema; DME diabetic macular edema; dbh dot blot hemorrhages; CWS cotton wool spot; POAG primary open angle glaucoma; C/D cup-to-disc ratio; HVF humphrey visual field; GVF goldmann visual field; OCT optical coherence tomography; IOP intraocular pressure; BRVO Branch retinal vein occlusion; CRVO central retinal vein occlusion; CRAO central retinal artery occlusion; BRAO branch retinal artery occlusion; RT retinal tear; SB scleral buckle; PPV pars plana vitrectomy; VH Vitreous hemorrhage; PRP panretinal laser photocoagulation; IVK intravitreal kenalog; VMT vitreomacular traction; MH Macular hole;  NVD neovascularization of the disc; NVE neovascularization elsewhere; AREDS age related eye disease study; ARMD age related macular degeneration; POAG primary open angle glaucoma; EBMD epithelial/anterior basement membrane dystrophy; ACIOL anterior chamber intraocular lens; IOL intraocular lens; PCIOL posterior chamber intraocular lens; Phaco/IOL phacoemulsification with intraocular lens placement; [place] photorefractive keratectomy; LASIK laser assisted in situ keratomileusis; HTN hypertension; DM diabetes mellitus; COPD chronic obstructive pulmonary disease

## 2020-01-03 ENCOUNTER — Other Ambulatory Visit: Payer: Self-pay

## 2020-01-03 ENCOUNTER — Other Ambulatory Visit: Payer: Self-pay | Admitting: Occupational Therapy

## 2020-01-03 NOTE — Patient Outreach (Signed)
[facility] Program  OT FINAL Visit  01/03/2020  Mary [name] [birth year] [id number]  Visit:  3- Third Visit  Start Time:  1045 End Time:  1115 Total Minutes:  30      Readiness to Change:  Readiness to Change Score: 7.33     Durable Medical Equipment: Adaptive Equipment: Reacher,Long Handled Sponge,Other (hand held shower head, shower grippers) Adaptive Equipment Distribution Date: 01/03/20  Patient Education: Education Provided: Yes Education Details: Educated on use of AE, strategies for showering, tips for safety in the home Person(s) Educated: Patient Comprehension: Verbalized Understanding  Goals: Goals Addressed            This Visit's Progress   . COMPLETED: Patient Stated       To improve ability to reach items on the floor and overhead.   ACTION PLANNING - CUSTOM  Target Problem Area: Reaching items on the floor and overhead  Why Problem May Occur:  -decreased mobility and balance -inability to bend at waist -high fall risk    Target Goal: To improve ability to reach items on the floor and overhead independently.   STRATEGIES Saving Your Energy: DO: DON'T:  Sit for tasks when able Stand on unsafe surfaces  Take your time Don't rush            Modifying your home environment and making it safe: DO: DON'T:  Use a reacher Stand on stools  Put frequently used items at waist level Don't put daily items up in high cabinets or down in low cabinets            Simplifying the way you set up tasks or daily routines: DO: DON'T:  Keep items in the rooms they are used in Put clothes and bathroom items in various places where you have to walk to get them         Practice It is important to practice the strategies so we can determine if they will be effective in helping to reach your goal. Follow these specific recommendations: 1. Use a reacher for high and low items 2.   Place daily items or frequently used items where you can reach  them easily  3.   Take your time, sit when you can  If a strategy does not work the first time, try it again and again (and maybe again)   Ezra Sites, OTR/L  [phone]       . COMPLETED: Patient Stated       To improve ability to get into and out of the tub/shower and perform bathing tasks safely and with greater independence.  ACTION PLANNING - BATHING  Target Problem Area: Getting into/out of shower and performing bathing tasks safely  Why Problem May Occur: -unable to get in/out of tub -Risk of falling -No grab bars -No AE    Target Goal: To improve ability to get into/out of shower safely and perform bathing tasks independently.   STRATEGIES Saving Your Energy: DO: DON'T:  Use a tub bench/seat Stand while bathing, it uses more energy  Use appropriate adaptive equipment:  long handled sponge, soap on a rope Rush  Keep all items you'll need within easy reach    Modifying your home environment and making it safe: DO: DON'T:  Install grab bars in the shower and next to the toilet Pull on the towel bar  Place a rubber mat along the entire length of the tub Place loose rugs in the bathroom- they can trip you or your  walker/cane can get caught on them  Make sure the bathroom is well lit    Install a hand held shower head    Simplifying the way you set up tasks or daily routines: DO: DON'T:  Plan to bathe/shower before you're overly tired Rush through [employer] all items before getting started      Practice It is important to practice the strategies so we can determine if they will be effective in helping to reach your goal.  Follow these specific recommendations: 1. Use a shower seat and grab bars 2. Take your time, don't rush during bathing or transferring  3. Use any necessary adaptive equipment  If a strategy does not work the first time, try it again and again (and maybe again).   Ezra Sites, OTR/L [phone]          Post Clinical Reasoning: Client Action (Goal) One Interventions: To improve ability to perform bathing tasks safely and independently. Did Client Try?: No Reason Client Did Not Try?: Other (pt declined to attempt task) Targeted Problem Area Status: A Little Better  Client Action (Goal) Two Interventions: To improve ability to reach for and grasp items on the floor or low surfaces. Did Client Try?: Yes Targeted Problem Area Status: A Lot Better  Clinician View Of Client Situation:: Pt continues to be very limited in mobility due to lymphedema in BLE. Pt teary today, asking about how to get into her laundry room to wash her clothes because her family is upset with her and being "ugly" and saying she needs to go to a nursng home. Discussed situation and provided psyschosocial support, discussed contacting MD and health insurance to inquire about an aid to assist with bathing, dressing, laundry, and meal preparation. Pt is no longer receiving PT/OT services. Discouraged pt from attempting to get into laundry room due to extreme fall risk, encouraged discussion with family regarding situation. Pt's 61 year old granddaughter with pt today and pt reporting that she is a big help. Pt is now living alone, her older granddaughter moved out recently. Pt is very appreciative of [facility] work and OT/RN assistance. Pt is capable of performing basic ADLs safely with new modifications and equipment provided.  Client View Of His/Her Situation:: Pt is frustrated with her family being tired of helping her. Has planned to contact MD regarding other service options for assistance and to inquire about an external cathetor for night use. Pt is excited about the AE provided today, gracious to OT for installing hand held shower head. Pt did have a fall/slide from the bed recently and had to call fire department for assistance to get back up. Pt trialing new reacher for picking up items and pulling up LB clothing, is excited for  new independence this provides.  Next Visit Plan:: N/A-discharge pt   Ezra Sites, OTR/L  [phone] 01/03/2020

## 2020-01-04 ENCOUNTER — Telehealth: Payer: Self-pay

## 2020-01-04 ENCOUNTER — Other Ambulatory Visit: Payer: Self-pay | Admitting: Internal Medicine

## 2020-01-04 ENCOUNTER — Encounter: Payer: Self-pay | Admitting: Internal Medicine

## 2020-01-04 DIAGNOSIS — I890 Lymphedema, not elsewhere classified: Secondary | ICD-10-CM

## 2020-01-04 NOTE — Patient Instructions (Signed)
Visit Information  Goals Addressed      Patient Stated   .  "to get the swelling down in my legs" (pt-stated)        CARE PLAN ENTRY (see longitudinal plan of care for additional care plan information)  Current Barriers:  [name] Knowledge Deficits related to evaluation and treatment of bilateral lymphedema  . Chronic Disease Management support and education needs related to Diabetes Mellitus with stage 3 Chronic Kidney disease, Parenchymal renal hypertension   Nurse Case Manager Clinical Goal(s):  [name] Over the next 30 days, patient will complete her new patient appointment with the [facility] for evaluation and treatment of lymphedema to her lower extremities Goal Met  . New 11/15/19 Over the next 180 days, patient will work with the CCM team and PCP to address needs related to disease education and support to help improve Self Health management of Lymphedema  CCM RN CM Interventions:  . Inter-disciplinary care team collaboration (see longitudinal plan of care) . Evaluation of current treatment plan related to right foot ulceration and bilateral lower leg lymphedema and patient's adherence to plan as established by provider . Determined patient denies having open wounds at this time . Educated on early signs/symptoms suggestive of skin breakdown and importance to notify PCP promptly if symptoms occur . Educated on skin breakdown prevention . Assessed for adherence to wearing compression stockings as directed, pt states she is adhering . Collaborated with PCP via secure message regarding ongoing recommendations for lymphedema treatment  . Discussed plans with patient for ongoing care management follow up and provided patient with direct contact information for care management team  Patient Self Care Activities:  . Continue to wear Juxta Lite Compression Stockings and keep extremities elevated as directed  . Self administers medications as prescribed . Attend all scheduled  provider appointments . Call pharmacy for medication refills . Call provider office for new concerns or questions . Supportive live in granddaughter with limited ability to assist with care needs  Please see past updates related to this goal by clicking on the "Past Updates" button in the selected goal       The patient verbalized understanding of instructions, educational materials, and care plan provided today and declined offer to receive copy of patient instructions, educational materials, and care plan.   Telephone follow up appointment with care management team member scheduled for: 02/10/20  Lynne Logan, RN

## 2020-01-04 NOTE — Telephone Encounter (Signed)
Let the patient know to contact [facility] for catheter.

## 2020-01-04 NOTE — Chronic Care Management (AMB) (Signed)
Chronic Care Management   Follow Up Note   12/28/2019 Name: Mary [name] MRN: [id number] DOB: 1945-02-03  Referred by: Mary Chard, MD Reason for referral : Chronic Care Management (RN CM FU Call )   Mary [name] is a 74 y.o. year old female who is a primary care patient of Mary Chard, MD. The CCM team was consulted for assistance with chronic disease management and care coordination needs.    Review of patient status, including review of consultants reports, relevant laboratory and other test results, and collaboration with appropriate care team members and the patient's provider was performed as part of comprehensive patient evaluation and provision of chronic care management services.    SDOH (Social Determinants of Health) assessments performed: No See Care Plan activities for detailed interventions related to [place])   Completed outbound CCM RN CM follow up call with patient for a  Care plan update.     Outpatient Encounter Medications as of 12/28/2019  Medication Sig Note  . Assure Comfort Lancets 28G MISC    . Blood Glucose Monitoring Suppl (ONETOUCH VERIO FLEX SYSTEM) w/Device KIT Use as directed to check blood sugars 2 times per day dx: e11.65   . cetirizine (ZYRTEC) 10 MG tablet TAKE 1 TABLET BY MOUTH EVERY DAY   . Dexchlorpheniramine Maleate (RYCLORA) 2 MG/5ML SOLN 5cc twice daily as needed   . dorzolamide-timolol (COSOPT) 22.3-6.8 MG/ML ophthalmic solution Place 1 drop into the right eye 2 (two) times daily.   . fluorometholone (FML) 0.1 % ophthalmic suspension Place 1 drop into the right eye 3 (three) times daily.  (Patient not taking: Reported on 12/31/2019)   . fluticasone (FLONASE) 50 MCG/ACT nasal spray SPRAY 1 SPRAY INTO EACH NOSTRIL EVERY DAY   . furosemide (LASIX) 40 MG tablet Take 1 tablet (40 mg total) by mouth daily.   [name] glucose blood (ONETOUCH VERIO) test strip Use as directed to check blood sugars 2 times per day dx: e11.65   .  HYDROcodone-acetaminophen (NORCO) 10-325 MG tablet Take 1 tablet by mouth 5 (five) times daily as needed.   [name] KLOR-CON M20 20 MEQ tablet Take 20 mEq by mouth daily. with food (Patient not taking: Reported on 12/31/2019) 02/23/2018: Patient instructed to hold this medication due to abnormal Potassium level of 6.2 on 02/18/18, per MD, Dr. Glendale [name]. Patient was given verbal education related to complications from hypo and hyperkalemia and importance of following MD recommendations. Patient instructed to resume this medication when directed by PCP. Patient verbalizes understanding.   [name] levothyroxine (SYNTHROID) 125 MCG tablet Take 125 mcg by mouth daily before breakfast.   . magnesium oxide (MAG-OX) 400 MG tablet TAKE 1 TABLET BY MOUTH EVERY EVENING   . metoprolol succinate (TOPROL-XL) 50 MG 24 hr tablet    . metoprolol tartrate (LOPRESSOR) 50 MG tablet Take 50 mg by mouth 2 (two) times daily.    [name] omeprazole (PRILOSEC) 20 MG capsule TAKE 1 CAPSULE BY MOUTH  DAILY (Patient not taking: No sig reported)   . OneTouch Delica Lancets 16W MISC Use as directed to check blood sugars 2 times per day dx: e11.65   . pravastatin (PRAVACHOL) 40 MG tablet TAKE 1 TABLET BY MOUTH EVERY DAY   . prednisoLONE acetate (PRED FORTE) 1 % ophthalmic suspension Place 1 drop into the right eye 3 (three) times daily.  (Patient not taking: Reported on 12/31/2019)   . predniSONE (DELTASONE) 10 MG tablet Take by mouth.    . telmisartan (MICARDIS) 80 MG tablet  TAKE 1 TABLET BY MOUTH  DAILY   . triamcinolone (KENALOG) 0.1 % APPLY TO AFFECTED AREA TWICE A DAY    No facility-administered encounter medications on file as of 12/28/2019.     Objective:  Lab Results  Component Value Date   HGBA1C 5.4 11/24/2019   HGBA1C 5.0 04/06/2019   HGBA1C 5.1 06/24/2018   Lab Results  Component Value Date   MICROALBUR 30 11/18/2017   LDLCALC 65 11/16/2018   CREATININE 1.27 (H) 11/24/2019   BP Readings from Last 3 Encounters:  11/24/19  132/84  08/08/19 (!) 140/80  06/28/19 (!) 166/98    Goals Addressed      Patient Stated   .  "to get the swelling down in my legs" (pt-stated)        CARE PLAN ENTRY (see longitudinal plan of care for additional care plan information)  Current Barriers:  [name] Knowledge Deficits related to evaluation and treatment of bilateral lymphedema  . Chronic Disease Management support and education needs related to Diabetes Mellitus with stage 3 Chronic Kidney disease, Parenchymal renal hypertension   Nurse Case Manager Clinical Goal(s):  [name] Over the next 30 days, patient will complete her [facility] and treatment of lymphedema to her lower extremities Goal Met  . New 11/15/19 Over the next 180 days, patient will work with the CCM team and PCP to address needs related to disease education and support to help improve Self Health management of Lymphedema  CCM RN CM Interventions:  . Inter-disciplinary care team collaboration (see longitudinal plan of care) . Evaluation of current treatment plan related to right foot ulceration and bilateral lower leg lymphedema and patient's adherence to plan as established by provider . Determined patient denies having open wounds at this time . Educated on early signs/symptoms suggestive of skin breakdown and importance to notify PCP promptly if symptoms occur . Educated on skin breakdown prevention . Assessed for adherence to wearing compression stockings as directed, pt states she is adhering . Collaborated with PCP via secure message regarding ongoing recommendations for lymphedema treatment  . Discussed plans with patient for ongoing care management follow up and provided patient with direct contact information for care management team  Patient Self Care Activities:  . Continue to wear Juxta Lite Compression Stockings and keep extremities elevated as directed  . Self administers medications as  prescribed . Attend all scheduled provider appointments . Call pharmacy for medication refills . Call provider office for new concerns or questions . Supportive live in granddaughter with limited ability to assist with care needs  Please see past updates related to this goal by clicking on the "Past Updates" button in the selected goal      Plan:   Telephone follow up appointment with care management team member scheduled for: 02/10/20  Barb Merino, RN, BSN, CCM Care Management Coordinator [place] [facility]  Direct Phone: [phone]

## 2020-01-06 ENCOUNTER — Telehealth: Payer: Self-pay

## 2020-01-06 NOTE — Telephone Encounter (Signed)
   Mary [name] DOB: 1945-05-20 MRN: [id number]   RIDER WAIVER AND RELEASE OF LIABILITY  For purposes of improving physical access to our facilities, [facility] is pleased to partner with third parties to provide [facility] patients or other authorized individuals the option of convenient, on-demand ground transportation services (the Chiropractor") through use of the technology service that enables users to request on-demand ground transportation from independent third-party providers.  By opting to use and accept these [employer], I, the undersigned, hereby agree on behalf of myself, and on behalf of any minor child using the [employer] for whom I am the parent or legal guardian, as follows:  1. Science writer provided to me are provided by independent third-party transportation providers who are not [employer] or employees and who are unaffiliated with [employer]. 2. [facility] is neither a transportation carrier nor a common or public carrier. 3. [facility] has no control over the quality or safety of the transportation that occurs as a result of the [employer]. 4. [facility] cannot guarantee that any third-party transportation provider will complete any arranged transportation service. 5. [facility] makes no representation, warranty, or guarantee regarding the reliability, timeliness, quality, safety, suitability, or availability of any of the Transport Services or that they will be error free. 6. I fully understand that traveling by vehicle involves risks and dangers of serious bodily injury, including permanent disability, paralysis, and death. I agree, on behalf of myself and on behalf of any minor child using the Transport Services for whom I am the parent or legal guardian, that the entire risk arising out of my use of the [employer] remains solely with me, to the maximum extent permitted under applicable law. 7. The [employer] are provided "as is" and "as available." [facility] disclaims all representations and warranties, express, implied or statutory, not expressly set out in these terms, including the implied warranties of merchantability and fitness for a particular purpose. 8. I hereby waive and release [facility], its agents, employees, officers, directors, representatives, insurers, attorneys, assigns, successors, subsidiaries, and affiliates from any and all past, present, or future claims, demands, liabilities, actions, causes of action, or suits of any kind directly or indirectly arising from acceptance and use of the [employer]. 9. I further waive and release [facility] and its affiliates from all present and future liability and responsibility for any injury or death to persons or damages to property caused by or related to the use of the [employer]. 10. I have read this Waiver and Release of Liability, and I understand the terms used in it and their legal significance. This Waiver is freely and voluntarily given with the understanding that my right (as well as the right of any minor child for whom I am the parent or legal guardian using the [employer]) to legal recourse against [place] in connection with the [employer] is knowingly surrendered in return for use of these services.   I attest that I read the consent document to Mary [name], gave Ms. Moraes the opportunity to ask questions and answered the questions asked (if any). I affirm that Mary [name] then provided consent for she's participation in this program.     Farley Ly

## 2020-01-13 ENCOUNTER — Other Ambulatory Visit: Payer: Self-pay

## 2020-01-13 ENCOUNTER — Ambulatory Visit: Payer: Medicare Other | Admitting: Podiatry

## 2020-01-13 MED ORDER — METOPROLOL SUCCINATE ER 50 MG PO TB24: 50.0000 mg | ORAL_TABLET | Freq: Every day | ORAL | 1 refills | Status: DC

## 2020-01-13 NOTE — Telephone Encounter (Signed)
This encounter was created in error - please disregard.

## 2020-01-13 NOTE — Patient Outreach (Signed)
Aging Gracefully Program  01/13/2020  [name] [birth year] [phone]  [facility] Evaluation Interviewer attempted to complete Aging Gracefully 5 month follow up. After beginning survey, patient stated she was not feeling well and call needed to be rescheduled for a later date.  CMA will attempt to call back within 1 week.  [name] Care Management Assistant [phone]

## 2020-01-20 ENCOUNTER — Other Ambulatory Visit: Payer: Self-pay

## 2020-01-20 ENCOUNTER — Ambulatory Visit (INDEPENDENT_AMBULATORY_CARE_PROVIDER_SITE_OTHER): Payer: Medicare Other | Admitting: Sports Medicine

## 2020-01-20 ENCOUNTER — Encounter: Payer: Self-pay | Admitting: Sports Medicine

## 2020-01-20 VITALS — BP 191/110 | HR 78 | Temp 98.4°F

## 2020-01-20 DIAGNOSIS — L84 Corns and callosities: Secondary | ICD-10-CM

## 2020-01-20 DIAGNOSIS — I890 Lymphedema, not elsewhere classified: Secondary | ICD-10-CM | POA: Diagnosis not present

## 2020-01-20 DIAGNOSIS — E1142 Type 2 diabetes mellitus with diabetic polyneuropathy: Secondary | ICD-10-CM

## 2020-01-20 DIAGNOSIS — D649 Anemia, unspecified: Secondary | ICD-10-CM | POA: Insufficient documentation

## 2020-01-20 DIAGNOSIS — R197 Diarrhea, unspecified: Secondary | ICD-10-CM | POA: Insufficient documentation

## 2020-01-20 DIAGNOSIS — I739 Peripheral vascular disease, unspecified: Secondary | ICD-10-CM | POA: Diagnosis not present

## 2020-01-20 DIAGNOSIS — M79675 Pain in left toe(s): Secondary | ICD-10-CM | POA: Diagnosis not present

## 2020-01-20 DIAGNOSIS — L97429 Non-pressure chronic ulcer of left heel and midfoot with unspecified severity: Secondary | ICD-10-CM

## 2020-01-20 DIAGNOSIS — M79674 Pain in right toe(s): Secondary | ICD-10-CM

## 2020-01-20 DIAGNOSIS — E11621 Type 2 diabetes mellitus with foot ulcer: Secondary | ICD-10-CM

## 2020-01-20 DIAGNOSIS — R194 Change in bowel habit: Secondary | ICD-10-CM | POA: Insufficient documentation

## 2020-01-20 DIAGNOSIS — Z1211 Encounter for screening for malignant neoplasm of colon: Secondary | ICD-10-CM | POA: Insufficient documentation

## 2020-01-20 DIAGNOSIS — B351 Tinea unguium: Secondary | ICD-10-CM | POA: Diagnosis not present

## 2020-01-20 MED ORDER — SULFAMETHOXAZOLE-TRIMETHOPRIM 800-160 MG PO TABS: 1.0000 | ORAL_TABLET | Freq: Two times a day (BID) | ORAL | 0 refills | Status: DC

## 2020-01-20 NOTE — Progress Notes (Signed)
Subjective: Mary [name] is a 75 y.o. female patient seen in office for evaluation of ulceration of the left heel.  Patient reports that about 3 weeks ago she experienced some excessive drainage from the left heel and told her primary care provider who recommended her to keep it clean and dry and to follow-up with my office.  Patient reports that since this problem has gotten worse on her heel has had issues with nausea. Denies fever/vomiting/chills/night sweats/shortness of breath. Patient has no other pedal complaints at this time.  Fasting blood sugar not recorded  Blood pressure and temperature as noted in chart  Patient Active Problem List   Diagnosis Date Noted  . Change in bowel habit 01/20/2020  . Chronic anemia 01/20/2020  . Colon cancer screening 01/20/2020  . Diabetes mellitus with stage 3 chronic kidney disease ([place]) 02/21/2018  . 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 Primary osteoarthritis of both knees 02/21/2018  . Class 3 severe obesity due to excess calories with serious comorbidity and body mass index (BMI) of 45.0 to 49.9 in adult ([place]) 02/21/2018  . Skin ulcer of right foot with fat layer exposed ([place]) 02/18/2018  . Morbid (severe) obesity due to excess calories ([place]) 11/18/2017  . Knee pain 07/16/2015  . Lateral dislocation of right patella 05/04/2015  . Rupture of right quadriceps tendon 05/24/2014  . S/P TKR (total knee replacement) 05/05/2012   Current Outpatient Medications on File Prior to Visit  Medication Sig Dispense Refill  . Assure Comfort Lancets 28G MISC     . Blood Glucose Monitoring Suppl (ONETOUCH VERIO FLEX SYSTEM) w/Device KIT Use as directed to check blood sugars 2 times per day dx: e11.65 1 kit 1  . cetirizine (ZYRTEC) 10 MG tablet TAKE 1 TABLET BY MOUTH EVERY DAY 90 tablet 1  . Dexchlorpheniramine Maleate (RYCLORA) 2 MG/5ML SOLN 5cc twice daily as needed 118 mL 0  . diphenoxylate-atropine (LOMOTIL) 2.5-0.025 MG tablet Take by mouth.     . dorzolamide-timolol (COSOPT) 22.3-6.8 MG/ML ophthalmic solution Place 1 drop into the right eye 2 (two) times daily.    . fluorometholone (FML) 0.1 % ophthalmic suspension Place 1 drop into the right eye 3 (three) times daily.    . fluticasone (FLONASE) 50 MCG/ACT nasal spray SPRAY 1 SPRAY INTO EACH NOSTRIL EVERY DAY 32 mL 1  . furosemide (LASIX) 40 MG tablet Take 1 tablet (40 mg total) by mouth daily. 90 tablet 2  . glucose blood (ONETOUCH VERIO) test strip Use as directed to check blood sugars 2 times per day dx: e11.65 100 each 3  . HYDROcodone-acetaminophen (NORCO) 10-325 MG tablet Take 1 tablet by mouth 5 (five) times daily as needed.    [name] KLOR-CON M20 20 MEQ tablet Take 20 mEq by mouth daily. with food  1  . levothyroxine (SYNTHROID) 125 MCG tablet Take 125 mcg by mouth daily before breakfast.    . magnesium oxide (MAG-OX) 400 MG tablet TAKE 1 TABLET BY MOUTH EVERY EVENING 90 tablet 1  . metoprolol succinate (TOPROL-XL) 50 MG 24 hr tablet Take 1 tablet (50 mg total) by mouth daily. 90 tablet 1  . metoprolol tartrate (LOPRESSOR) 50 MG tablet Take 50 mg by mouth 2 (two) times daily.     [name] omeprazole (PRILOSEC) 20 MG capsule TAKE 1 CAPSULE BY MOUTH  DAILY 90 capsule 3  . ondansetron (ZOFRAN) 4 MG tablet Take by mouth.    [name] Delica Lancets 74Y MISC Use as directed to check blood sugars 2  times per day dx: e11.65 100 each 3  . pravastatin (PRAVACHOL) 40 MG tablet TAKE 1 TABLET BY MOUTH EVERY DAY 90 tablet 1  . prednisoLONE acetate (PRED FORTE) 1 % ophthalmic suspension Place 1 drop into the right eye 3 (three) times daily.    . predniSONE (DELTASONE) 10 MG tablet Take by mouth.     . telmisartan (MICARDIS) 80 MG tablet TAKE 1 TABLET BY MOUTH  DAILY 90 tablet 3  . triamcinolone (KENALOG) 0.1 % APPLY TO AFFECTED AREA TWICE A DAY 30 g 0   No current facility-administered medications on file prior to visit.   No Known Allergies  Recent Results (from the past 2160 hour(s))   CMP14+EGFR     Status: Abnormal   Collection Time: 11/24/19  5:01 PM  Result Value Ref Range   Glucose 91 65 - 99 mg/dL   BUN 24 8 - 27 mg/dL   Creatinine, Ser 1.27 (H) 0.57 - 1.00 mg/dL   GFR calc non Af Amer 43 (L) >59 mL/min/1.73   GFR calc Af Amer 49 (L) >59 mL/min/1.73    Comment: **In accordance with recommendations from the NKF-ASN Task force,**   [facility] is in the process of updating its eGFR calculation to the   2021 CKD-EPI creatinine equation that estimates kidney function   without a race variable.    BUN/Creatinine Ratio 19 12 - 28   Sodium 138 134 - 144 mmol/L   Potassium 4.5 3.5 - 5.2 mmol/L   Chloride 102 96 - 106 mmol/L   CO2 24 20 - 29 mmol/L   Calcium 9.8 8.7 - 10.3 mg/dL   Total Protein 7.3 6.0 - 8.5 g/dL   Albumin 4.2 3.7 - 4.7 g/dL   Globulin, Total 3.1 1.5 - 4.5 g/dL   Albumin/Globulin Ratio 1.4 1.2 - 2.2   Bilirubin Total 0.5 0.0 - 1.2 mg/dL   Alkaline Phosphatase 107 44 - 121 IU/L    Comment:               **Please note reference interval change**   AST 12 0 - 40 IU/L   ALT 10 0 - 32 IU/L  Hemoglobin A1c     Status: None   Collection Time: 11/24/19  5:01 PM  Result Value Ref Range   Hgb A1c MFr Bld 5.4 4.8 - 5.6 %    Comment:          Prediabetes: 5.7 - 6.4          Diabetes: >6.4          Glycemic control for adults with diabetes: <7.0    Est. average glucose Bld gHb Est-mCnc 108 mg/dL  TSH     Status: None   Collection Time: 11/24/19  5:01 PM  Result Value Ref Range   TSH 1.470 0.450 - 4.500 uIU/mL  T4, free     Status: Abnormal   Collection Time: 11/24/19  5:01 PM  Result Value Ref Range   Free T4 2.05 (H) 0.82 - 1.77 ng/dL    Objective: There were no vitals filed for this visit.  General: Patient is awake, alert, oriented x 3 and in no acute distress.  Dermatology: Skin is warm and dry bilateral with a ulceration noted to the left heel with ruptured blister skin and exposed wound bed with a granular base that measures approximately 5  x 3 cm with significant maceration and malodor, the ulceration does not probe to bone, there is clear to bloody active drainage,  no erythema, significant lymphedema.   Vascular status: Pedal pulses unable to be palpated due to severe lymphedema.  Neurologic: Gross sensation present via light touch however with touch to the ulceration there is increased pain.  Musculosketal: There is pain palpation to left heel at area of ulceration.  No results for input(s): GRAMSTAIN, LABORGA in the last 8760 hours.  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 -X-rays not obtained at this visit due to patient mobility issues unable to maneuver to take x-ray at this time -Cleansed ulceration and remove the loose blistered skin using a tissue nipper and then wound culture was obtained and applied Betadine and dry dressing -Start patient on Bactrim antibiotic until culture results are available -Gave patient wound care supplies for her granddaughter to help with changing the dressing at minimum every other day until we can establish home wound care; phone call was made to [facility] and a voicemail was left for a phone call back to coordinate home nursing for this patient. - Advised patient to go to the ER or return to office if the wound worsens or if constitutional symptoms are present. -Patient to return to  office in 2 weeks for follow-up wound care or sooner if problems or issues arise. -Patient is scheduled to go to lymphedema clinic on 01-27-20.  Landis Martins, DPM

## 2020-01-21 DIAGNOSIS — M170 Bilateral primary osteoarthritis of knee: Secondary | ICD-10-CM | POA: Diagnosis not present

## 2020-01-21 DIAGNOSIS — E1122 Type 2 diabetes mellitus with diabetic chronic kidney disease: Secondary | ICD-10-CM | POA: Diagnosis not present

## 2020-01-21 DIAGNOSIS — I890 Lymphedema, not elsewhere classified: Secondary | ICD-10-CM | POA: Diagnosis not present

## 2020-01-21 DIAGNOSIS — L89623 Pressure ulcer of left heel, stage 3: Secondary | ICD-10-CM | POA: Diagnosis not present

## 2020-01-21 DIAGNOSIS — M6281 Muscle weakness (generalized): Secondary | ICD-10-CM | POA: Diagnosis not present

## 2020-01-21 DIAGNOSIS — I129 Hypertensive chronic kidney disease with stage 1 through stage 4 chronic kidney disease, or unspecified chronic kidney disease: Secondary | ICD-10-CM | POA: Diagnosis not present

## 2020-01-21 DIAGNOSIS — N183 Chronic kidney disease, stage 3 unspecified: Secondary | ICD-10-CM | POA: Diagnosis not present

## 2020-01-23 LAB — WOUND CULTURE
MICRO NUMBER:: 11390352
SPECIMEN QUALITY:: ADEQUATE

## 2020-01-24 DIAGNOSIS — I129 Hypertensive chronic kidney disease with stage 1 through stage 4 chronic kidney disease, or unspecified chronic kidney disease: Secondary | ICD-10-CM | POA: Diagnosis not present

## 2020-01-24 DIAGNOSIS — I890 Lymphedema, not elsewhere classified: Secondary | ICD-10-CM | POA: Diagnosis not present

## 2020-01-24 DIAGNOSIS — L89623 Pressure ulcer of left heel, stage 3: Secondary | ICD-10-CM | POA: Diagnosis not present

## 2020-01-24 DIAGNOSIS — E1122 Type 2 diabetes mellitus with diabetic chronic kidney disease: Secondary | ICD-10-CM | POA: Diagnosis not present

## 2020-01-24 DIAGNOSIS — N183 Chronic kidney disease, stage 3 unspecified: Secondary | ICD-10-CM | POA: Diagnosis not present

## 2020-01-24 DIAGNOSIS — M6281 Muscle weakness (generalized): Secondary | ICD-10-CM | POA: Diagnosis not present

## 2020-01-24 DIAGNOSIS — M170 Bilateral primary osteoarthritis of knee: Secondary | ICD-10-CM | POA: Diagnosis not present

## 2020-01-25 ENCOUNTER — Ambulatory Visit: Payer: Self-pay

## 2020-01-25 ENCOUNTER — Ambulatory Visit (HOSPITAL_COMMUNITY): Payer: Medicare Other | Admitting: Physical Therapy

## 2020-01-25 DIAGNOSIS — N183 Chronic kidney disease, stage 3 unspecified: Secondary | ICD-10-CM

## 2020-01-25 DIAGNOSIS — I129 Hypertensive chronic kidney disease with stage 1 through stage 4 chronic kidney disease, or unspecified chronic kidney disease: Secondary | ICD-10-CM

## 2020-01-25 DIAGNOSIS — E1122 Type 2 diabetes mellitus with diabetic chronic kidney disease: Secondary | ICD-10-CM

## 2020-01-25 DIAGNOSIS — L89623 Pressure ulcer of left heel, stage 3: Secondary | ICD-10-CM | POA: Diagnosis not present

## 2020-01-25 DIAGNOSIS — I890 Lymphedema, not elsewhere classified: Secondary | ICD-10-CM | POA: Diagnosis not present

## 2020-01-25 DIAGNOSIS — M170 Bilateral primary osteoarthritis of knee: Secondary | ICD-10-CM | POA: Diagnosis not present

## 2020-01-25 DIAGNOSIS — T8130XA Disruption of wound, unspecified, initial encounter: Secondary | ICD-10-CM | POA: Diagnosis not present

## 2020-01-25 DIAGNOSIS — M6281 Muscle weakness (generalized): Secondary | ICD-10-CM | POA: Diagnosis not present

## 2020-01-25 NOTE — Patient Instructions (Signed)
   Goals we discussed today:  Goals Addressed    . Identify caregiver resources       Timeframe:  Long-Range Goal Priority:  High Start Date:  1.11.22                           Expected End Date:  4.11.22                     Next planned outreach date: 1.14.22  Patient Goals/Self-Care Activities Over the next 30 days, patient will:   - Patient will self administer medications as prescribed Patient will attend all scheduled provider appointments Patient will call provider office for new concerns or questions Engage with CAP/DA program to initiate application Engaged with [facility] to complete PCS assessment Contact SW as needed prior to next scheduled call

## 2020-01-25 NOTE — Chronic Care Management (AMB) (Signed)
Chronic Care Management    Social Work Note  01/25/2020 Name: Mary [name] MRN: [id number] DOB: 04/12/1945  Mary [name] is a 75 y.o. year old female who is a primary care patient of Mary Chard, MD. The CCM team was consulted to assist the patient with chronic disease management and/or care coordination needs related to: [employer] .   Engaged with patient by telephone for follow up visit in response to provider referral for social work chronic care management and care coordination services.   Consent to Services:  The patient was given information about Chronic Care Management services, agreed to services, and gave verbal consent prior to initiation of services.  Please see initial visit note for detailed documentation.   Patient agreed to services and consent obtained.   Assessment/Interventions: Review of patient past medical history, allergies, medications, and health status, including review of relevant consultants reports was performed today as part of a comprehensive evaluation and provision of chronic care management and care coordination services.     SDOH (Social Determinants of Health) assessments and interventions performed: No   Advanced Directives Status: Not addressed in this encounter.  CCM Care Plan  No Known Allergies  Outpatient Encounter Medications as of 01/25/2020  Medication Sig Note  . Assure Comfort Lancets 28G MISC    . Blood Glucose Monitoring Suppl (ONETOUCH VERIO FLEX SYSTEM) w/Device KIT Use as directed to check blood sugars 2 times per day dx: e11.65   . cetirizine (ZYRTEC) 10 MG tablet TAKE 1 TABLET BY MOUTH EVERY DAY   . Dexchlorpheniramine Maleate (RYCLORA) 2 MG/5ML SOLN 5cc twice daily as needed   . diphenoxylate-atropine (LOMOTIL) 2.5-0.025 MG tablet Take by mouth.   . dorzolamide-timolol (COSOPT) 22.3-6.8 MG/ML ophthalmic solution Place 1 drop into the right eye 2 (two) times daily.   . fluorometholone (FML) 0.1 % ophthalmic  suspension Place 1 drop into the right eye 3 (three) times daily.   . fluticasone (FLONASE) 50 MCG/ACT nasal spray SPRAY 1 SPRAY INTO EACH NOSTRIL EVERY DAY   . furosemide (LASIX) 40 MG tablet Take 1 tablet (40 mg total) by mouth daily.   [name] glucose blood (ONETOUCH VERIO) test strip Use as directed to check blood sugars 2 times per day dx: e11.65   . HYDROcodone-acetaminophen (NORCO) 10-325 MG tablet Take 1 tablet by mouth 5 (five) times daily as needed.   [name] KLOR-CON M20 20 MEQ tablet Take 20 mEq by mouth daily. with food 02/23/2018: Patient instructed to hold this medication due to abnormal Potassium level of 6.2 on 02/18/18, per MD, Dr. [place] [name]. Patient was given verbal education related to complications from hypo and hyperkalemia and importance of following MD recommendations. Patient instructed to resume this medication when directed by PCP. Patient verbalizes understanding.   [name] levothyroxine (SYNTHROID) 125 MCG tablet Take 125 mcg by mouth daily before breakfast.   . magnesium oxide (MAG-OX) 400 MG tablet TAKE 1 TABLET BY MOUTH EVERY EVENING   . metoprolol succinate (TOPROL-XL) 50 MG 24 hr tablet Take 1 tablet (50 mg total) by mouth daily.   . metoprolol tartrate (LOPRESSOR) 50 MG tablet Take 50 mg by mouth 2 (two) times daily.    [name] omeprazole (PRILOSEC) 20 MG capsule TAKE 1 CAPSULE BY MOUTH  DAILY   . ondansetron (ZOFRAN) 4 MG tablet Take by mouth.   [name] Delica Lancets 23J MISC Use as directed to check blood sugars 2 times per day dx: e11.65   . pravastatin (PRAVACHOL) 40 MG tablet  TAKE 1 TABLET BY MOUTH EVERY DAY   . prednisoLONE acetate (PRED FORTE) 1 % ophthalmic suspension Place 1 drop into the right eye 3 (three) times daily.   . predniSONE (DELTASONE) 10 MG tablet Take by mouth.    . sulfamethoxazole-trimethoprim (BACTRIM DS) 800-160 MG tablet Take 1 tablet by mouth 2 (two) times daily.   [name] telmisartan (MICARDIS) 80 MG tablet TAKE 1 TABLET BY MOUTH  DAILY   . triamcinolone  (KENALOG) 0.1 % APPLY TO AFFECTED AREA TWICE A DAY    No facility-administered encounter medications on file as of 01/25/2020.    Patient Active Problem List   Diagnosis Date Noted  . Change in bowel habit 01/20/2020  . Chronic anemia 01/20/2020  . Colon cancer screening 01/20/2020  . Diabetes mellitus with stage 3 chronic kidney disease ([place]) 02/21/2018  . 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 Primary osteoarthritis of both knees 02/21/2018  . Class 3 severe obesity due to excess calories with serious comorbidity and body mass index (BMI) of 45.0 to 49.9 in adult ([place]) 02/21/2018  . Skin ulcer of right foot with fat layer exposed ([place]) 02/18/2018  . Morbid (severe) obesity due to excess calories ([place]) 11/18/2017  . Knee pain 07/16/2015  . Lateral dislocation of right patella 05/04/2015  . Rupture of right quadriceps tendon 05/24/2014  . S/P TKR (total knee replacement) 05/05/2012    Conditions to be addressed/monitored: HTN, DMII and CKD Stage III; Limited access to caregiver  Patient Care Plan: Social Work Care Plan    Problem Identified: Care Coordination     Long-Range Goal: Identify caregiver resources   Start Date: 01/25/2020  Expected End Date: 04/24/2020  This Visit's Progress: On track  Priority: High  Note:   Current Barriers:  . Limited social support . Level of care concerns . ADL IADL limitations . Limited access to caregiver . Chronic conditions including DM, CKD III, and HTN  Social Work Clinical Goal(s):  [name] Over the next 90 days, patient will work with SW to address concerns related to caregiver resources . Over the next 30 days, patient will follow up with CAP/DA program * as directed by SW  Interventions: . 1:1 collaboration with Mary Chard, MD regarding development and update of comprehensive plan of care as evidenced by provider attestation and co-signature . Inter-disciplinary care team collaboration (see longitudinal plan of  care) . Inbound call received from Reche Dixon with [employer] who indicates the patients grand-daughter Mary [name] [phone]) has contacted [employer] to request assistance with a PCS application . Discussed the patient continues to need some help in the home which is currently provided by family members with transfers, propping feet up, and getting in and out of bed . Successful outbound call placed to the patients grand-daughter to discuss caregiver needs . Determined the patients family is interested in applying for both PCS and the CAP/DA program . Communication with Navjot Pilgrim the patients daughter to confirm desire of applying for both programs . Reviewed what each program has to offer with the patients daughter and explained the patient would not be able to receive services from both programs . Performed chart review to note the patient has a [facility] DSNP plan- unfortunately the patients daughter believes the patient does not have full Medicaid coverage but rather MQB.  [name] Advised [name] there is a chance the patient will not qualify for CAP or PCS due to type of Medicaid coverage - will attempt both applications  . Discussed SW  will submit a PCS application on the patients behalf . Advised [name] SW would place a referral to the CAP/DA program for them to reach out to the patient regarding application process- [name] indicates the patient has began this application process at least twice and never completed it . Stressed the importance of completing this process fully in order to place patient on the wait list to receive services . Initiated PCS application; collaboration with Dr. Baird Cancer for completion . Collaboration with CAP/DA program to refer the patient and request an information packet be mailed to the patients home  Patient Goals/Self-Care Activities Over the next 30 days, patient will:   - Patient will self administer medications as prescribed Patient will attend  all scheduled provider appointments Patient will call provider office for new concerns or questions Engage with CAP/DA program to initiate application Engaged with [facility] to complete PCS assessment Contact SW as needed prior to next scheduled call  Follow up Plan: SW will follow up with patient by phone over the next month       Follow Up Plan: SW will follow up with patient by phone over the next 14 days.      [name], BSW, CDP Social Worker, Certified Dementia Practitioner [place] [facility] [phone]  Total time spent performing care coordination and/or care management activities with the patient by phone or face to face = 40 minutes.

## 2020-01-27 ENCOUNTER — Other Ambulatory Visit: Payer: Self-pay

## 2020-01-27 ENCOUNTER — Ambulatory Visit (HOSPITAL_COMMUNITY): Payer: Medicare Other | Attending: Internal Medicine | Admitting: Physical Therapy

## 2020-01-27 DIAGNOSIS — I890 Lymphedema, not elsewhere classified: Secondary | ICD-10-CM | POA: Insufficient documentation

## 2020-01-27 DIAGNOSIS — S91302S Unspecified open wound, left foot, sequela: Secondary | ICD-10-CM | POA: Insufficient documentation

## 2020-01-27 NOTE — Therapy (Deleted)
[facility] [address] [place], [place], [address] Phone: [phone]   Fax:  [phone]  Physical Therapy Evaluation  Patient Details  Name: Mary [name] MRN: [id number] Date of Birth: 09/01/1945 Referring Provider (PT): Dorothyann Pengobyn Sanders   Encounter Date: 01/27/2020   PT End of Session - 01/27/20 1652    Visit Number 1    Number of Visits 24    Date for PT Re-Evaluation 03/27/20    Authorization Type UHC medicare    Progress Note Due on Visit 10    PT Start Time 1450    PT Stop Time 1615    PT Time Calculation (min) 85 min    Activity Tolerance Patient tolerated treatment well    Behavior [facility] tasks assessed/performed           Past Medical History:  Diagnosis Date  . Arthritis    osteoarthritis  . Cataract    OS  . Diabetes mellitus    Insulin dependent  . Diabetic retinopathy (HCC)    NPDR OU  . Glaucoma    POAG OD  . H/O cardiovascular stress test    pt. reports 10/13- was normal per pt.   . Hyperlipemia   . Hypertension   . Hypertensive retinopathy    OU    Past Surgical History:  Procedure Laterality Date  . CARPAL TUNNEL RELEASE Bilateral   . CATARACT EXTRACTION Right 10/05/2018   Dr. Alben SpittleWeaver  . EYE SURGERY Right    Cat Sx  . KNEE ARTHROSCOPY     right  . QUADRICEPS TENDON REPAIR  02/17/2012   Procedure: REPAIR QUADRICEP TENDON;  Surgeon: Mary HuhSteve Lucey, MD;  Location: [facility] OR;  Service: Orthopedics;  Laterality: Right;  right quadriceps repair with latera release  . QUADRICEPS TENDON REPAIR Right 05/15/2012   Procedure: REPAIR QUADRICEP TENDON;  Surgeon: Mary HuhSteve Lucey, MD;  Location: [facility] OR;  Service: Orthopedics;  Laterality: Right;  . TOTAL KNEE ARTHROPLASTY  10/21/2011   rt tk  . TOTAL KNEE ARTHROPLASTY  10/21/2011   Procedure: TOTAL KNEE ARTHROPLASTY;  Surgeon: Raymon MuttonStephen D Lucey, MD;  Location: [facility] OR;  Service: Orthopedics;  Laterality: Right;  . TUBAL LIGATION      There were no vitals  filed for this visit.    Subjective Assessment - 01/27/20 1453    Subjective Ms. Mary AbedLindsay states that she was dx with Lymphedema in May of 2021.  She was given a wrap for her leg that was about four inches wide and stated that that was all she could do for her.  She  has developed a wound on  her Lt heel 3-4 weeks ago.  Her MD told her to cleanse the wound an use betadine on the wound.  The therapist explained that she should not be using betatdine.    Pertinent History anemia, obesity, DM, CKD, OA, Lt heel ulcer; Rt TKR    How long can you sit comfortably? no problem just has difficulty getting out of a chair.    How long can you stand comfortably? stands with a walker and can stand for five minutes    How long can you walk comfortably? walk with a walker for five minutes    Patient Stated Goals to get the lymphedema down and be able to walk without a walker    Currently in Pain? No/denies              [facility] - 01/27/20 0001  Assessment   Medical Diagnosis B LE lymphedema    Referring Provider (PT) Dorothyann Peng    Onset Date/Surgical Date 03/15/19    Next MD Visit 03/17/2020    Prior Therapy non for this      Precautions   Precautions Fall      Restrictions   Weight Bearing Restrictions No      Balance Screen   Has the patient fallen in the past 6 months No    Has the patient had a decrease in activity level because of a fear of falling?  Yes    Is the patient reluctant to leave their home because of a fear of falling?  Yes      Home Environment   Living Environment Private residence    Home Access Level entry      Prior Function   Level of Independence Independent with household mobility with device    Vocation Retired      IT consultant   Overall Cognitive Status Within Functional Limits for tasks assessed      Observation/Other Assessments   Skin Integrity Pt has wound on Lt heel 2cm length; 6cm width             LYMPHEDEMA/ONCOLOGY QUESTIONNAIRE  - 01/27/20 0001      What other symptoms do you have   Are you Having Heaviness or Tightness Yes    Are you having Pain No    Are you having pitting edema No    Is it Hard or Difficult finding clothes that fit Yes    Do you have infections No      Lymphedema Stage   Stage STAGE 3 ELEPHANTIASIS      Lymphedema Assessments   Lymphedema Assessments Lower extremities      Right Lower Extremity Lymphedema   10 cm Proximal to Suprapatella 82.3 cm    At Midpatella/Popliteal Crease 62.5 cm    30 cm Proximal to Floor at Lateral Plantar Foot 56.9 cm    20 cm Proximal to Floor at Lateral Plantar Foot 49.2 1    10  cm Proximal to Floor at Lateral Malleoli 44.5 cm    Circumference of ankle/heel 46 cm.    5 cm Proximal to 1st MTP Joint 35 cm    Across MTP Joint 33.7 cm    Around Proximal Great Toe 14 cm      Left Lower Extremity Lymphedema   At Midpatella/Popliteal Crease 61.8 cm    30 cm Proximal to Floor at Lateral Plantar Foot 59.3 cm    20 cm Proximal to Floor at Lateral Plantar Foot 53.3 cm    10 cm Proximal to Floor at Lateral Malleoli 51.5 cm    Circumference of ankle/heel 51.3 cm.    5 cm Proximal to 1st MTP Joint 35 cm    Across MTP Joint 34.8 cm    Around Proximal Great Toe 13.2 cm                   Objective measurements completed on examination: See above findings.       [facility] Adult PT Treatment/Exercise - 01/27/20 0001      Exercises   Exercises Knee/Hip      Knee/Hip Exercises: Seated   Long Arc Quad 10 reps    Other Seated Knee/Hip Exercises ankle pumps , diaphramic breathing    Marching 10 reps    Abduction/Adduction  10 reps      Manual Therapy   Manual Therapy Compression  Bandaging    Manual therapy comments completed seperate from all other aspects of treatment    Compression Bandaging foam cut for B LE then multilayer short stretch bandaging and foam from toes to Knee B  with xeroform, 4x4 and kerlix placed on Lt heel wound prior to bandaging                   PT Education - 01/27/20 1650    Education Details what lymphedema is, how it is controlled, LE exercises, keep bandaging on unless it is painful or gets wet then take it off.    Person(s) Educated Patient    Methods Explanation;Verbal cues;Handout    Comprehension Verbalized understanding;Returned demonstration            PT Short Term Goals - 01/27/20 1703      PT SHORT TERM GOAL #1   Title [id number]             PT Long Term Goals - 01/27/20 1703      PT LONG TERM GOAL #1   Title [id number]                  Plan - 01/27/20 1657    Clinical Impression Statement Ms. Batte is a 75 yo female who states that she was dx with B lymphedema approximately a year ago.  She has been to numerous places but nobody seemed to know what to do for her therefore she has just watched her legs getting bigger and bigger.  It is to the point now that she can not fit in her clothes or wear any shoes, she is unable to walk any distance due to the wt of her legs.  She is currently being referred to skilled Pt for B lymphedema.  The therapist has sent an order to the referring MD to see if we can complete wound care as well as she has an ulcer on her Lt heel.  Ms. Beining will benefit from skilled PT for total decongestive techniques as well as wound care to improve her mobility and decrease her risk of falling and cellulitis.    Personal Factors and Comorbidities Comorbidity 2;Time since onset of injury/illness/exacerbation;Transportation    Comorbidities obesity, anemia, Lt heel ulcer, CKD    Examination-Activity Limitations Bathing;Bed Mobility;Bend;Caring for Others;Carry;Dressing;Hygiene/Grooming;Lift;Locomotion Level;Reach Overhead;Sit;Squat;Stairs;Stand;Toileting;Transfers    Examination-Participation Restrictions Church;Cleaning;Community Activity;Driving;Laundry;Personal Finances;Yard Work    Conservation officer, historic buildings  Evolving/Moderate complexity    Clinical Decision Making Moderate    Rehab Potential Good    PT Frequency 3x / week    PT Duration 8 weeks    PT Treatment/Interventions ADLs/Self Care Home Management;Patient/family education;Manual techniques;Manual lymph drainage;Compression bandaging;Other (comment)   wound care          Patient will benefit from skilled therapeutic intervention in order to improve the following deficits and impairments:  Abnormal gait,Obesity,Increased edema,Decreased skin integrity,Decreased strength,Decreased activity tolerance,Decreased balance,Decreased mobility,Difficulty walking,Decreased range of motion  Visit Diagnosis: Lymphedema, not elsewhere classified  Non-healing open wound of left heel, sequela     Problem List Patient Active Problem List   Diagnosis Date Noted  . Change in bowel habit 01/20/2020  . Chronic anemia 01/20/2020  . Colon cancer screening 01/20/2020  . Diabetes mellitus with stage 3 chronic kidney disease (HCC) 02/21/2018  . 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 Primary osteoarthritis of both knees 02/21/2018  . Class 3 severe obesity due to excess calories with serious comorbidity and body mass index (BMI) of 45.0 to 49.9  in adult [facility]) 02/21/2018  . Skin ulcer of right foot with fat layer exposed (HCC) 02/18/2018  . Morbid (severe) obesity due to excess calories (HCC) 11/18/2017  . Knee pain 07/16/2015  . Lateral dislocation of right patella 05/04/2015  . Rupture of right quadriceps tendon 05/24/2014  . S/P TKR (total knee replacement) 05/05/2012    Virgina Organ, PT CLT [phone] 01/27/2020, 5:05 PM  [facility] [address] [place], [place], [address] Phone: [phone]   Fax:  [phone]  Name: Mary [name] MRN: [id number] Date of Birth: 23-Sep-1945

## 2020-01-28 ENCOUNTER — Telehealth: Payer: Medicare Other

## 2020-01-28 ENCOUNTER — Ambulatory Visit (HOSPITAL_COMMUNITY): Payer: Medicare Other

## 2020-01-28 ENCOUNTER — Encounter (HOSPITAL_COMMUNITY): Payer: Self-pay

## 2020-01-28 DIAGNOSIS — I890 Lymphedema, not elsewhere classified: Secondary | ICD-10-CM | POA: Diagnosis not present

## 2020-01-28 DIAGNOSIS — S91302S Unspecified open wound, left foot, sequela: Secondary | ICD-10-CM

## 2020-01-28 NOTE — Therapy (Addendum)
[facility] [address] [place], [place], [address] Phone: [phone]   Fax:  [phone]  Physical Therapy Treatment  Patient Details  Name: Mary [name] MRN: [id number] Date of Birth: 1945-08-25 Referring Provider (PT): Dorothyann Peng   Encounter Date: 01/28/2020   PT End of Session - 01/28/20 1257    Visit Number 2    Number of Visits 24    Date for PT Re-Evaluation 03/27/20    Authorization Type UHC medicare    Progress Note Due on Visit 10    PT Start Time 1118    PT Stop Time 1247    PT Time Calculation (min) 89 min    Activity Tolerance Patient tolerated treatment well    Behavior During Therapy [facility] for tasks assessed/performed           Past Medical History:  Diagnosis Date  . Arthritis    osteoarthritis  . Cataract    OS  . Diabetes mellitus    Insulin dependent  . Diabetic retinopathy (HCC)    NPDR OU  . Glaucoma    POAG OD  . H/O cardiovascular stress test    pt. reports 10/13- was normal per pt.   . Hyperlipemia   . Hypertension   . Hypertensive retinopathy    OU    Past Surgical History:  Procedure Laterality Date  . CARPAL TUNNEL RELEASE Bilateral   . CATARACT EXTRACTION Right 10/05/2018   Dr. Alben Spittle  . EYE SURGERY Right    Cat Sx  . KNEE ARTHROSCOPY     right  . QUADRICEPS TENDON REPAIR  02/17/2012   Procedure: REPAIR QUADRICEP TENDON;  Surgeon: Dannielle Huh, MD;  Location: [facility] OR;  Service: Orthopedics;  Laterality: Right;  right quadriceps repair with latera release  . QUADRICEPS TENDON REPAIR Right 05/15/2012   Procedure: REPAIR QUADRICEP TENDON;  Surgeon: Dannielle Huh, MD;  Location: [facility] OR;  Service: Orthopedics;  Laterality: Right;  . TOTAL KNEE ARTHROPLASTY  10/21/2011   rt tk  . TOTAL KNEE ARTHROPLASTY  10/21/2011   Procedure: TOTAL KNEE ARTHROPLASTY;  Surgeon: Raymon Mutton, MD;  Location: [facility] OR;  Service: Orthopedics;  Laterality: Right;  . TUBAL LIGATION      There were no vitals  filed for this visit.   Subjective Assessment - 01/28/20 1300    Subjective Pt arrived still wearing compression garments, reports comfort and no reports of pain today.    Pertinent History anemia, obesity, DM, CKD, OA, Lt heel ulcer; Rt TKR    Patient Stated Goals to get the lymphedema down and be able to walk without a walker    Currently in Pain? No/denies                             [facility] Adult PT Treatment/Exercise - 01/28/20 0001      Transfers   Comments attempted transfer, unable.  Session complete in [facility]      Exercises   Exercises Knee/Hip      Knee/Hip Exercises: Seated   Long Arc Quad 10 reps    Other Seated Knee/Hip Exercises ankle pumps , diaphramic breathing    Marching 10 reps    Abduction/Adduction  10 reps      Manual Therapy   Manual Therapy Compression Bandaging    Manual therapy comments completed seperate from all other aspects of treatment    Compression Bandaging Lt heel applied xeroform, 4x4 and  kerlix.  Multilayer short stretch bandages with 1/2 foam                    PT Short Term Goals - 01/28/20 0824      PT SHORT TERM GOAL #1   Title PT to be I in LE exercises to increase lymphatic flow to decrease edema.    Time 2    Period Weeks    Status New    Target Date 02/10/20      PT SHORT TERM GOAL #2   Title PT measurements to be decreased by 3-5 cm to decrease risk of cellulitis.    Time 3    Period Weeks    Status New             PT Long Term Goals - 01/28/20 0827      PT LONG TERM GOAL #1   Title PT measurements in LE to decrease by 6 cm to allow pt to be able to lift her leg onto her bed without difficulty.    Time 8    Period Weeks    Status New    Target Date 03/23/20      PT LONG TERM GOAL #2   Title PT to be able to walk for 75 ft with rolling walker due to decreased wt of LE    Time 8    Period Weeks    Status New      PT LONG TERM GOAL #3   Title PT wound on LT heel to be healed    Time 8     Period Weeks    Status New      PT LONG TERM GOAL #4   Title PT to have both compression pump as well as compression garment to be able to control LE volume following discharge of physical therapy.    Time 8    Period Weeks    Status New                 Plan - 01/28/20 1303    Clinical Impression Statement Pt arrived still wearing bandages, pt educated to remove a couple hours prior apt and roll bandages.  Educated 4 parts of lymphedema care including: importance of skin care and importance of moisturization for skin integrity, importance of compliance with HEP for strengthening (reviewed exercises and printout given), manual for lymphedema reduction and compression garments.  Order not received for wound care, did clean, moisturize and applied dressings, no debridement complete.  Some of the bandages were wet, pt stated she stands on wet rags to get around bathroom, pt strongly encouraged to stop this as is a fall risk.  Encouraged pt to get shower gripers installed for safety.  Due to time restrictions no manual complete this session.  Applied multilayer short stretch bandages including toes with 1/2 foam to BLE toes to knee.  Reports of comfort at EOS.  Encouraged pt to hang dry wet bandages, dry bandages replaced this session.    Personal Factors and Comorbidities Comorbidity 2;Time since onset of injury/illness/exacerbation;Transportation    Comorbidities obesity, anemia, Lt heel ulcer, CKD    Examination-Activity Limitations Bathing;Bed Mobility;Bend;Caring for Others;Carry;Dressing;Hygiene/Grooming;Lift;Locomotion Level;Reach Overhead;Sit;Squat;Stairs;Stand;Toileting;Transfers    Examination-Participation Restrictions Church;Cleaning;Community Activity;Driving;Laundry;Personal Finances;Yard Work    Stability/Clinical Decision Making Evolving/Moderate complexity    Clinical Decision Making Moderate    Rehab Potential Good    PT Frequency 3x / week    PT Duration 8 weeks    PT  Treatment/Interventions ADLs/Self Care Home Management;Patient/family education;Manual techniques;Manual lymph drainage;Compression bandaging;Other (comment)    PT Next Visit Plan Begin manual next session and application of multilayer shortstretch.  Educated on pump.    PT Home Exercise Plan 01/28/20:  LAQ, ankle pumps, march, ABD/ADD           Patient will benefit from skilled therapeutic intervention in order to improve the following deficits and impairments:  Abnormal gait,Obesity,Increased edema,Decreased skin integrity,Decreased strength,Decreased activity tolerance,Decreased balance,Decreased mobility,Difficulty walking,Decreased range of motion  Visit Diagnosis: Non-healing open wound of left heel, sequela  Lymphedema, not elsewhere classified     Problem List Patient Active Problem List   Diagnosis Date Noted  . Change in bowel habit 01/20/2020  . Chronic anemia 01/20/2020  . Colon cancer screening 01/20/2020  . Diabetes mellitus with stage 3 chronic kidney disease (HCC) 02/21/2018  . 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 Primary osteoarthritis of both knees 02/21/2018  . Class 3 severe obesity due to excess calories with serious comorbidity and body mass index (BMI) of 45.0 to 49.9 in adult (HCC) 02/21/2018  . Skin ulcer of right foot with fat layer exposed (HCC) 02/18/2018  . Morbid (severe) obesity due to excess calories (HCC) 11/18/2017  . Knee pain 07/16/2015  . Lateral dislocation of right patella 05/04/2015  . Rupture of right quadriceps tendon 05/24/2014  . S/P TKR (total knee replacement) 05/05/2012   Becky Sax, LPTA/CLT; CBIS [phone]  Juel Burrow 01/28/2020, 1:40 PM  [facility] [address] [place], [place], [address] Phone: [phone]   Fax:  [phone]  Name: BREYONA SWANDER MRN: [id number] Date of Birth: 03-Jan-1946

## 2020-01-28 NOTE — Addendum Note (Signed)
Addended by: Bella Kennedy on: 01/28/2020 08:34 AM   Modules accepted: Orders

## 2020-01-28 NOTE — Therapy (Addendum)
[facility] [address] [place], [place], [address] Phone: [phone]   Fax:  [phone]  Physical Therapy Evaluation  Patient Details  Name: KYLEENA SCHEIRER MRN: [id number] Date of Birth: Dec 17, 1945 Referring Provider (PT): Dorothyann Peng   Encounter Date: 01/27/2020   PT End of Session - 01/27/20 1652    Visit Number 1    Number of Visits 24    Date for PT Re-Evaluation 03/27/20    Authorization Type UHC medicare    Progress Note Due on Visit 10    PT Start Time 1450    PT Stop Time 1615    PT Time Calculation (min) 85 min    Activity Tolerance Patient tolerated treatment well    Behavior During Therapy [facility] for tasks assessed/performed           Past Medical History:  Diagnosis Date  . Arthritis    osteoarthritis  . Cataract    OS  . Diabetes mellitus    Insulin dependent  . Diabetic retinopathy (HCC)    NPDR OU  . Glaucoma    POAG OD  . H/O cardiovascular stress test    pt. reports 10/13- was normal per pt.   . Hyperlipemia   . Hypertension   . Hypertensive retinopathy    OU    Past Surgical History:  Procedure Laterality Date  . CARPAL TUNNEL RELEASE Bilateral   . CATARACT EXTRACTION Right 10/05/2018   Dr. Alben Spittle  . EYE SURGERY Right    Cat Sx  . KNEE ARTHROSCOPY     right  . QUADRICEPS TENDON REPAIR  02/17/2012   Procedure: REPAIR QUADRICEP TENDON;  Surgeon: Dannielle Huh, MD;  [facility] OR;  Service: Orthopedics;  Laterality: Right;  right quadriceps repair with latera release  . QUADRICEPS TENDON REPAIR Right 05/15/2012   Procedure: REPAIR QUADRICEP TENDON;  Surgeon: Dannielle Huh, MD;  Location: [facility] OR;  Service: Orthopedics;  Laterality: Right;  . TOTAL KNEE ARTHROPLASTY  10/21/2011   rt tk  . TOTAL KNEE ARTHROPLASTY  10/21/2011   Procedure: TOTAL KNEE ARTHROPLASTY;  Surgeon: Raymon Mutton, MD;  Location: [facility] OR;  Service: Orthopedics;  Laterality: Right;  . TUBAL LIGATION      There were no vitals  filed for this visit.    Subjective Assessment - 01/27/20 1453    Subjective Ms. Bonk states that she was dx with Lymphedema in May of 2021.  She was given a wrap for her leg that was about four inches wide and stated that that was all she could do for her.  She  has developed a wound on  her Lt heel 3-4 weeks ago.  Her MD told her to cleanse the wound an use betadine on the wound.  The therapist explained that she should not be using betatdine.    Pertinent History anemia, obesity, DM, CKD, OA, Lt heel ulcer; Rt TKR    How long can you sit comfortably? no problem just has difficulty getting out of a chair.    How long can you stand comfortably? stands with a walker and can stand for five minutes    How long can you walk comfortably? walk with a walker for five minutes    Patient Stated Goals to get the lymphedema down and be able to walk without a walker    Currently in Pain? No/denies                  PT Assessment -  01/27/20 0001              Assessment    Medical Diagnosis B LE lymphedema     Referring Provider (PT) Dorothyann Peng     Onset Date/Surgical Date 03/15/19     Next MD Visit 03/17/2020     Prior Therapy non for this          Precautions    Precautions Fall          Restrictions    Weight Bearing Restrictions No          Balance Screen    Has the patient fallen in the past 6 months No     Has the patient had a decrease in activity level because of a fear of falling?  Yes     Is the patient reluctant to leave their home because of a fear of falling?  Yes          Home Environment    Living Environment Private residence     Home Access Level entry          Prior Function    Level of Independence Independent with household mobility with device     Vocation Retired          IT consultant    Overall Cognitive Status Within Functional Limits for tasks assessed          Observation/Other Assessments    Skin Integrity Pt has  wound on Lt heel 2cm length; 6cm width                 LYMPHEDEMA/ONCOLOGY QUESTIONNAIRE - 01/27/20 0001              What other symptoms do you have    Are you Having Heaviness or Tightness Yes     Are you having Pain No     Are you having pitting edema No     Is it Hard or Difficult finding clothes that fit Yes     Do you have infections No          Lymphedema Stage    Stage STAGE 3 ELEPHANTIASIS          Lymphedema Assessments    Lymphedema Assessments Lower extremities          Right Lower Extremity Lymphedema    10 cm Proximal to Suprapatella 82.3 cm     At Midpatella/Popliteal Crease 62.5 cm     30 cm Proximal to Floor at Lateral Plantar Foot 56.9 cm     20 cm Proximal to Floor at Lateral Plantar Foot 49.2 1     10  cm Proximal to Floor at Lateral Malleoli 44.5 cm     Circumference of ankle/heel 46 cm.     5 cm Proximal to 1st MTP Joint 35 cm     Across MTP Joint 33.7 cm     Around Proximal Great Toe 14 cm          Left Lower Extremity Lymphedema    At Midpatella/Popliteal Crease 61.8 cm     30 cm Proximal to Floor at Lateral Plantar Foot 59.3 cm     20 cm Proximal to Floor at Lateral Plantar Foot 53.3 cm     10 cm Proximal to Floor at Lateral Malleoli 51.5 cm     Circumference of ankle/heel 51.3 cm.     5 cm Proximal to 1st MTP Joint 35 cm     Across MTP Joint 34.8 cm  Around Proximal Great Toe 13.2 cm             [facility] Adult PT Treatment/Exercise - 01/27/20 0001              Exercises    Exercises Knee/Hip          Knee/Hip Exercises: Seated    Long Arc Quad 10 reps     Other Seated Knee/Hip Exercises ankle pumps , diaphramic breathing     Marching 10 reps     Abduction/Adduction  10 reps          Manual Therapy    Manual Therapy Compression Bandaging     Manual therapy comments completed seperate from all other aspects of treatment     Compression Bandaging foam cut for B LE then multilayer  short stretch bandaging and foam from toes to Knee B  with xeroform, 4x4 and kerlix placed on Lt heel wound prior to bandaging                 Objective measurements completed on examination: See above findings.               PT Education - 01/27/20 1650    Education Details what lymphedema is, how it is controlled, LE exercises, keep bandaging on unless it is painful or gets wet then take it off.    Person(s) Educated Patient    Methods Explanation;Verbal cues;Handout    Comprehension Verbalized understanding;Returned demonstration            PT Short Term Goals - 01/28/20 0824      PT SHORT TERM GOAL #1   Title PT to be I in LE exercises to increase lymphatic flow to decrease edema.    Time 2    Period Weeks    Status New    Target Date 02/10/20      PT SHORT TERM GOAL #2   Title PT measurements to be decreased by 3-5 cm to decrease risk of cellulitis.    Time 3    Period Weeks    Status New             PT Long Term Goals - 01/28/20 0827      PT LONG TERM GOAL #1   Title PT measurements in LE to decrease by 6 cm to allow pt to be able to lift her leg onto her bed without difficulty.    Time 8    Period Weeks    Status New    Target Date 03/23/20      PT LONG TERM GOAL #2   Title PT to be able to walk for 75 ft with rolling walker due to decreased wt of LE    Time 8    Period Weeks    Status New      PT LONG TERM GOAL #3   Title PT wound on LT heel to be healed    Time 8    Period Weeks    Status New      PT LONG TERM GOAL #4   Title PT to have both compression pump as well as compression garment to be able to control LE volume following discharge of physical therapy.    Time 8    Period Weeks    Status New                  Plan - 01/27/20 1657    Clinical Impression Statement Ms. Bodkins is  a 75 yo female who states that she was dx with B lymphedema approximately a year ago.  She has been to numerous places but nobody  seemed to know what to do for her therefore she has just watched her legs getting bigger and bigger.  It is to the point now that she can not fit in her clothes or wear any shoes, she is unable to walk any distance due to the wt of her legs.  She is currently being referred to skilled Pt for B lymphedema.  The therapist has sent an order to the referring MD to see if we can complete wound care as well as she has an ulcer on her Lt heel.  [name] will benefit from skilled PT for total decongestive techniques as well as wound care to improve her mobility and decrease her risk of falling and cellulitis.    Personal Factors and Comorbidities Comorbidity 2;Time since onset of injury/illness/exacerbation;Transportation    Comorbidities obesity, anemia, Lt heel ulcer, CKD    Examination-Activity Limitations Bathing;Bed Mobility;Bend;Caring for Others;Carry;Dressing;Hygiene/Grooming;Lift;Locomotion Level;Reach Overhead;Sit;Squat;Stairs;Stand;Toileting;Transfers    Examination-Participation Restrictions Church;Cleaning;Community Activity;Driving;Laundry;Personal Finances;Yard Work    Conservation officer, historic buildingstability/Clinical Decision Making Evolving/Moderate complexity    Clinical Decision Making Moderate    Rehab Potential Good    PT Frequency 3x / week    PT Duration 8 weeks    PT Treatment/Interventions     Plan     HEP  ADLs/Self Care Home Management;Patient/family education;Manual techniques;Manual lymph drainage;Compression bandaging;Other (comment)   wound care  Continue with Total decongestive techniques including toe wrapping.  Begin debridement if needed once order for wound care gets back but continue dressing wound prior to compression dressing  Ankle pumps, LAQ, sitting marching, hip ab/adduction as well as diaphragmic breathing.           Patient will benefit from skilled therapeutic intervention in order to improve the following deficits and impairments:  Abnormal gait,Obesity,Increased  edema,Decreased skin integrity,Decreased strength,Decreased activity tolerance,Decreased balance,Decreased mobility,Difficulty walking,Decreased range of motion  Visit Diagnosis: Lymphedema, not elsewhere classified  Non-healing open wound of left heel, sequela     Problem List Patient Active Problem List   Diagnosis Date Noted  . Change in bowel habit 01/20/2020  . Chronic anemia 01/20/2020  . Colon cancer screening 01/20/2020  . Diabetes mellitus with stage 3 chronic kidney disease (HCC) 02/21/2018  . 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 Primary osteoarthritis of both knees 02/21/2018  . Class 3 severe obesity due to excess calories with serious comorbidity and body mass index (BMI) of 45.0 to 49.9 in adult (HCC) 02/21/2018  . Skin ulcer of right foot with fat layer exposed (HCC) 02/18/2018  . Morbid (severe) obesity due to excess calories (HCC) 11/18/2017  . Knee pain 07/16/2015  . Lateral dislocation of right patella 05/04/2015  . Rupture of right quadriceps tendon 05/24/2014  . S/P TKR (total knee replacement) 05/05/2012   Virgina Organynthia Derald Lorge, PT CLT [phone] 01/28/2020, 8:30 AM  [facility] [address] [place], [place], [address] Phone: [phone]   Fax:  [phone]  Name: Dannielle BurnSandra E Common MRN: [id number] Date of Birth: 08/10/1945

## 2020-01-28 NOTE — Patient Instructions (Signed)
Heel Raise (Sitting)    Raise heels, keeping toes on floor. Repeat 10 times per set. Do 3 sets per session. Do 2_ sessions per day.  http://orth.exer.us/45   Copyright  VHI. All rights reserved.   Long [employer]    Straighten operated leg and try to hold it 3 seconds.  Repeat 10 times. Do 3 sessions a day.  http://gt2.exer.us/311   Copyright  VHI. All rights reserved.   Marching    Alternate lifting knees as high as is comfortable, as if marching. Repeat 10 times each leg. Do 3 sessions per day. Note: If possible place feet on floor.  Copyright  VHI. All rights reserved.   ADDUCTION: Isometric    With ball/towel/pillow between knees, squeeze them inward. Hold 5 seconds. Complete 3 sets of 10 repetitions.   http://gtsc.exer.us/125   Copyright  VHI. All rights reserved.   Abduction: Isometric - Bilateral (Sitting)    Position Patient: Keep legs slightly apart, feet flat. Helper: Place hands on outside of both knees. Motion - Cue patient to press legs apart. - Helper blocks movement. Hold 5 seconds. Relax. Repeat 10 times. Do 3 sessions per day.   Copyright  VHI. All rights reserved.

## 2020-02-01 ENCOUNTER — Ambulatory Visit (HOSPITAL_COMMUNITY): Payer: Medicare Other | Admitting: Physical Therapy

## 2020-02-03 ENCOUNTER — Other Ambulatory Visit: Payer: Self-pay | Admitting: Internal Medicine

## 2020-02-03 ENCOUNTER — Telehealth: Payer: Self-pay

## 2020-02-03 ENCOUNTER — Ambulatory Visit (HOSPITAL_COMMUNITY): Payer: Medicare Other | Admitting: Physical Therapy

## 2020-02-03 ENCOUNTER — Telehealth: Payer: Medicare Other

## 2020-02-03 NOTE — Telephone Encounter (Signed)
  Chronic Care Management   Outreach Note  02/03/2020 Name: ANQUANETTE BAHNER MRN: [id number] DOB: [birth year]  Referred by: Dorothyann Peng, MD Reason for referral : Care Coordination   SW collaboration with primary provider office to follow up on status of patients PCS form. Form has yet to be completed at this time.  Follow Up Plan: SW will follow up on form status over the next week.  Bevelyn Ngo, BSW, CDP Social Worker, Certified Dementia Practitioner [facility] Care Management [phone]

## 2020-02-04 ENCOUNTER — Telehealth (HOSPITAL_COMMUNITY): Payer: Self-pay

## 2020-02-04 ENCOUNTER — Encounter (INDEPENDENT_AMBULATORY_CARE_PROVIDER_SITE_OTHER): Payer: Medicare Other | Admitting: Ophthalmology

## 2020-02-04 NOTE — Telephone Encounter (Signed)
Spoke to [place] from [employer] who plans to deliver pump later today.    Becky Sax, LPTA/CLT; [place] [phone]

## 2020-02-08 ENCOUNTER — Ambulatory Visit (HOSPITAL_COMMUNITY): Payer: Medicare Other | Admitting: Physical Therapy

## 2020-02-08 ENCOUNTER — Other Ambulatory Visit: Payer: Self-pay

## 2020-02-08 DIAGNOSIS — I890 Lymphedema, not elsewhere classified: Secondary | ICD-10-CM

## 2020-02-08 DIAGNOSIS — S91302S Unspecified open wound, left foot, sequela: Secondary | ICD-10-CM

## 2020-02-08 NOTE — Therapy (Signed)
[facility] [address] [place], [place], [address] Phone: [phone]   Fax:  [phone]  Physical Therapy Treatment  Patient Details  Name: Mary [name] MRN: [id number] Date of Birth: 21-Feb-1945 Referring Provider (PT): Dorothyann Peng   Encounter Date: 02/08/2020   PT End of Session - 02/08/20 1528    Visit Number 3    Number of Visits 24    Date for PT Re-Evaluation 03/27/20    Authorization Type [employer] medicare    Progress Note Due on Visit 10    PT Start Time 0915    PT Stop Time 1038    PT Time Calculation (min) 83 min    Activity Tolerance Patient tolerated treatment well    Behavior During Therapy [facility] for tasks assessed/performed           Past Medical History:  Diagnosis Date  . Arthritis    osteoarthritis  . Cataract    OS  . Diabetes mellitus    Insulin dependent  . Diabetic retinopathy (HCC)    NPDR OU  . Glaucoma    POAG OD  . H/O cardiovascular stress test    pt. reports 10/13- was normal per pt.   . Hyperlipemia   . Hypertension   . Hypertensive retinopathy    OU    Past Surgical History:  Procedure Laterality Date  . CARPAL TUNNEL RELEASE Bilateral   . CATARACT EXTRACTION Right 10/05/2018   Dr. Alben Spittle  . EYE SURGERY Right    Cat Sx  . KNEE ARTHROSCOPY     right  . QUADRICEPS TENDON REPAIR  02/17/2012   Procedure: REPAIR QUADRICEP TENDON;  Surgeon: Dannielle Huh, MD;  Location: [facility] OR;  Service: Orthopedics;  Laterality: Right;  right quadriceps repair with latera release  . QUADRICEPS TENDON REPAIR Right 05/15/2012   Procedure: REPAIR QUADRICEP TENDON;  Surgeon: Dannielle Huh, MD;  Location: [facility] OR;  Service: Orthopedics;  Laterality: Right;  . TOTAL KNEE ARTHROPLASTY  10/21/2011   rt tk  . TOTAL KNEE ARTHROPLASTY  10/21/2011   Procedure: TOTAL KNEE ARTHROPLASTY;  Surgeon: Raymon Mutton, MD;  Location: [facility] OR;  Service: Orthopedics;  Laterality: Right;  . TUBAL LIGATION      There were no vitals  filed for this visit.   Subjective Assessment - 02/08/20 1523    Subjective Pt returns today after nearly 2 weeks missed due to inclement weather.  States she has been working on the exercises given by therapist.    Currently in Pain? No/denies                 LYMPHEDEMA/ONCOLOGY QUESTIONNAIRE - 02/08/20 1524      What other symptoms do you have   Are you Having Heaviness or Tightness Yes    Are you having Pain No    Are you having pitting edema No    Is it Hard or Difficult finding clothes that fit Yes    Do you have infections No      Lymphedema Stage   Stage STAGE 3 ELEPHANTIASIS      Lymphedema Assessments   Lymphedema Assessments Lower extremities      Right Lower Extremity Lymphedema   10 cm Proximal to Suprapatella --   was 82.3   At Midpatella/Popliteal Crease --   was 62.5   30 cm Proximal to Floor at Lateral Plantar Foot 52 cm   was 56.9   20 cm Proximal to Floor at Lateral Plantar Foot  46 1   was 49.2   10 cm Proximal to Floor at Lateral Malleoli 43.5 cm   was 44.5   Circumference of ankle/heel 46 cm.   was 46   5 cm Proximal to 1st MTP Joint 34 cm   was 35   Across MTP Joint 33.5 cm   was 33.7   Around Proximal Great Toe 11 cm   was 14     Left Lower Extremity Lymphedema   At Midpatella/Popliteal Crease --   was 61.8   30 cm Proximal to Floor at Lateral Plantar Foot 56 cm   was 59.3   20 cm Proximal to Floor at Lateral Plantar Foot 51 cm   was 53.3   10 cm Proximal to Floor at Lateral Malleoli 49 cm   was 51.5   Circumference of ankle/heel 48 cm.   was 51.3   5 cm Proximal to 1st MTP Joint 34 cm   was 35   Across MTP Joint 34 cm   was 34.8   Around Proximal Great Toe 11.5 cm   was 13.2                     [facility] Adult PT Treatment/Exercise - 02/08/20 0001      Manual Therapy   Manual Therapy Compression Bandaging;Manual Lymphatic Drainage (MLD)    Manual therapy comments completed seperate from all other aspects of treatment    Manual  Lymphatic Drainage (MLD) anterior only for bil LE's    Compression Bandaging Lt heel applied xeroform, 4x4 and kerlix.  Multilayer short stretch bandages with 1/2 foam                    PT Short Term Goals - 01/28/20 0824      PT SHORT TERM GOAL #1   Title PT to be I in LE exercises to increase lymphatic flow to decrease edema.    Time 2    Period Weeks    Status New    Target Date 02/10/20      PT SHORT TERM GOAL #2   Title PT measurements to be decreased by 3-5 cm to decrease risk of cellulitis.    Time 3    Period Weeks    Status New             PT Long Term Goals - 01/28/20 0827      PT LONG TERM GOAL #1   Title PT measurements in LE to decrease by 6 cm to allow pt to be able to lift her leg onto her bed without difficulty.    Time 8    Period Weeks    Status New    Target Date 03/23/20      PT LONG TERM GOAL #2   Title PT to be able to walk for 75 ft with rolling walker due to decreased wt of LE    Time 8    Period Weeks    Status New      PT LONG TERM GOAL #3   Title PT wound on LT heel to be healed    Time 8    Period Weeks    Status New      PT LONG TERM GOAL #4   Title PT to have both compression pump as well as compression garment to be able to control LE volume following discharge of physical therapy.    Time 8    Period Weeks  Status New                 Plan - 02/08/20 1529    Clinical Impression Statement No wraps on LE's for nearly 2 weeks as she had a BM following last appointment that soiled them and she quickly removed them.  Pt has washed her bandages and has kept a dressing on her Lt heel.  Pt received her pump on Friday but has not used it yet.  Educated on using only when her bandages are off.  Order still not signed to complete woundcare on Lt heel so order refaxed to MD.  Pt able to stand with modA of 2 from [facility] and using walker ambulate 5 steps over to mat.  Pt with cues and instruction for logroll to increase ease of  bed mobility.  Pt tends to go back in to extension for transfers and is unable to scoot back properly.  Cues to shift weight forward and to reduce use of UE's to assist LE's.  Max assist with sit to supine due to inability to lift LE's and mod assist to come back into sitting with logroll instruction.   Measured LE's with reduction noted despite several weeks of not coming her for treatment. Lt heel wound with dry devitalized tissue perimeter that needs to be debrided once order received.  Noted hardened area at medial heel with suspect depth due to pressure.  Will continue to watch this area.    Personal Factors and Comorbidities Comorbidity 2;Time since onset of injury/illness/exacerbation;Transportation    Comorbidities obesity, anemia, Lt heel ulcer, CKD    Examination-Activity Limitations Bathing;Bed Mobility;Bend;Caring for Others;Carry;Dressing;Hygiene/Grooming;Lift;Locomotion Level;Reach Overhead;Sit;Squat;Stairs;Stand;Toileting;Transfers    Examination-Participation Restrictions Church;Cleaning;Community Activity;Driving;Laundry;Personal Finances;Yard Work    Conservation officer, historic buildingstability/Clinical Decision Making Evolving/Moderate complexity    Rehab Potential Good    PT Frequency 3x / week    PT Duration 8 weeks    PT Treatment/Interventions ADLs/Self Care Home Management;Patient/family education;Manual techniques;Manual lymph drainage;Compression bandaging;Other (comment)    PT Next Visit Plan Continue complete lymphatic therapy.  Begin debridement on Lt heel when order received.    PT Home Exercise Plan 01/28/20:  LAQ, ankle pumps, march, ABD/ADD           Patient will benefit from skilled therapeutic intervention in order to improve the following deficits and impairments:  Abnormal gait,Obesity,Increased edema,Decreased skin integrity,Decreased strength,Decreased activity tolerance,Decreased balance,Decreased mobility,Difficulty walking,Decreased range of motion  Visit Diagnosis: Non-healing open wound  of left heel, sequela  Lymphedema, not elsewhere classified     Problem List Patient Active Problem List   Diagnosis Date Noted  . Change in bowel habit 01/20/2020  . Chronic anemia 01/20/2020  . Colon cancer screening 01/20/2020  . Diabetes mellitus with stage 3 chronic kidney disease (HCC) 02/21/2018  . 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 Primary osteoarthritis of both knees 02/21/2018  . Class 3 severe obesity due to excess calories with serious comorbidity and body mass index (BMI) of 45.0 to 49.9 in adult (HCC) 02/21/2018  . Skin ulcer of right foot with fat layer exposed (HCC) 02/18/2018  . Morbid (severe) obesity due to excess calories (HCC) 11/18/2017  . Knee pain 07/16/2015  . Lateral dislocation of right patella 05/04/2015  . Rupture of right quadriceps tendon 05/24/2014  . S/P TKR (total knee replacement) 05/05/2012   Lurena NidaAmy B Frazier, PTA/CLT [phone]  Lurena NidaFrazier, Amy B 02/08/2020, 3:32 PM  [facility] [address] [place], [place], [address] Phone: [phone]   Fax:  [phone]  Name: VIVICA DOBOSZ MRN: [id number] Date of Birth: 18-May-1945

## 2020-02-09 ENCOUNTER — Ambulatory Visit: Payer: Medicare Other

## 2020-02-09 DIAGNOSIS — N183 Chronic kidney disease, stage 3 unspecified: Secondary | ICD-10-CM

## 2020-02-09 DIAGNOSIS — E1122 Type 2 diabetes mellitus with diabetic chronic kidney disease: Secondary | ICD-10-CM

## 2020-02-09 NOTE — Patient Instructions (Signed)
Goals we discussed today:  Goals Addressed              This Visit's Progress     Patient Stated   .  "I would like to new power Wheelchair to get around better outside of my home" (pt-stated)   On track     [place] (see longitudinal plan of care for additional care plan information)  Current Barriers:  [name] Knowledge Deficits related to determination for best DME to help improve mobility  . Chronic Disease Management support and education needs related to Diabetes Mellitus with stage 3 Chronic Kidney disease, Parenchymal renal hypertension  . Impaired Physical Mobility   Nurse Case Manager Clinical Goal(s):  [name] Over the next 90 days, patient will verbalize understanding of plan for evaluation and determination for best DME to help improve patient's mobility, manual W/C, verses electric W/C, verses power Scooter Goal Met . New 11/15/19 Over the next 180 days, patient will work with the CCM team and PCP to assist with coordinating DME needs to include a power W/C  CCM RN CM Interventions:  11/15/19 Collaborative call with [facility] nurse . Inter-disciplinary care team collaboration (see longitudinal plan of care) . Evaluation of current treatment plan related to Impaired Physical Mobility and DME needs and patient's adherence to plan as established by provider . Spoke with [facility] nurse Larena Glassman RN regarding patient's mobility issues and DME status . Determined [place] will service patient for the electric W/C needed due to [place] having delay's and lack of communication with the health care team in order to expedite the DME order received  . Determined Larena Glassman RN has initiated this change and has notified [facility] in order for the PT to complete the face to face form needed for PA via Medicare 11/15/19 completed call with patient . Determined patient is aware of the change in DME providers and is up to date on that status of this change . Determined patient  continues to use her rental manual W/C as needed with the assistance of her granddaughter who is currently residing with her in her home . Discussed plans with patient for ongoing care management follow up and provided patient with direct contact information for care management team 11/25/19 CM RN Case Collaboration  . Placed outbound call to [place] for status update on patient's power W/C . Determined the intake for this DME has been initiated and further information is needed related to patient's PT evaluation . Determined [place] has left a message with [facility] and [facility] PT requesting this information  . Placed outbound call to patient, provided status update for power W/C . Discussed plans with patient for ongoing care management follow up and provided patient with direct contact information for care management team  CCM SW Interventions Completed 1.26.22 . Successful outbound call placed to the patient to assist with care coordination needs . Discussed the patient has yet to obtain power chair and is concerned that her current chair is not functioning properly . Outbound call placed to [place] to follow up with status of order o Spoke with [facility] in [place] who confirms PT evaluation was completed o Awaiting to hear back fron insurance to determine if a face to face will be needed to conmplete order o Determined if a face to face is needed it can be a virtual appointment  o Discussed Reche Dixon with [facility] has been very involved with process and was recently  updated on status o Advised Beth this team will collaborate with Cecille Rubin for future updates  o Reported patient concern that her current chair is not functioning properly o Discussed Eustaquio Maize will reach out to the patient to assess concern and determine if a different chair is available for exchange o Collboration with [place] to provide an update  Patient Self Care Activities:   . Self administers medications as prescribed . Attends all scheduled provider appointments . Calls pharmacy for medication refills . Calls provider office for new concerns or questions  Please see past updates related to this goal by clicking on the "Past Updates" button in the selected goal        Other   .  Identify caregiver resources   On track     Timeframe:  Long-Range Goal Priority:  High Start Date:  1.11.22                           Expected End Date:  4.11.22                     Next planned outreach date: 2.1.22  Patient Goals/Self-Care Activities Over the next 30 days, patient will:   - Patient will self administer medications as prescribed Patient will attend all scheduled provider appointments Patient will call provider office for new concerns or questions Engage with CAP/DA program to initiate application Engaged with [facility] to complete PCS assessment Contact SW as needed prior to next scheduled call

## 2020-02-09 NOTE — Chronic Care Management (AMB) (Signed)
Chronic Care Management    Social Work Note  02/09/2020 Name: Mary [name] MRN: [id number] DOB: 1945-09-16  Mary [name] is a 75 y.o. year old female who is a primary care patient of Glendale Chard, MD. The CCM team was consulted to assist the patient with chronic disease management and/or care coordination needs related to: [employer]  and Caregiver Stress.   Engaged with patient by telephone for follow up visit in response to provider referral for social work chronic care management and care coordination services.   Consent to Services:  The patient was given information about Chronic Care Management services, agreed to services, and gave verbal consent prior to initiation of services.  Please see initial visit note for detailed documentation.   Patient agreed to services and consent obtained.   Assessment: Review of patient past medical history, allergies, medications, and health status, including review of relevant consultants reports was performed today as part of a comprehensive evaluation and provision of chronic care management and care coordination services.     SDOH (Social Determinants of Health) assessments and interventions performed:    Advanced Directives Status: Not addressed in this encounter.  CCM Care Plan  No Known Allergies  Outpatient Encounter Medications as of 02/09/2020  Medication Sig Note  . Assure Comfort Lancets 28G MISC    . Blood Glucose Monitoring Suppl (ONETOUCH VERIO FLEX SYSTEM) w/Device KIT Use as directed to check blood sugars 2 times per day dx: e11.65   . cetirizine (ZYRTEC) 10 MG tablet TAKE 1 TABLET BY MOUTH EVERY DAY   . Dexchlorpheniramine Maleate (RYCLORA) 2 MG/5ML SOLN 5cc twice daily as needed   . diphenoxylate-atropine (LOMOTIL) 2.5-0.025 MG tablet Take by mouth.   . dorzolamide-timolol (COSOPT) 22.3-6.8 MG/ML ophthalmic solution Place 1 drop into the right eye 2 (two) times daily.   . fluorometholone (FML) 0.1 %  ophthalmic suspension Place 1 drop into the right eye 3 (three) times daily.   . fluticasone (FLONASE) 50 MCG/ACT nasal spray SPRAY 1 SPRAY INTO EACH NOSTRIL EVERY DAY   . furosemide (LASIX) 40 MG tablet Take 1 tablet (40 mg total) by mouth daily.   [name] glucose blood (ONETOUCH VERIO) test strip Use as directed to check blood sugars 2 times per day dx: e11.65   . HYDROcodone-acetaminophen (NORCO) 10-325 MG tablet Take 1 tablet by mouth 5 (five) times daily as needed.   [name] KLOR-CON M20 20 MEQ tablet Take 20 mEq by mouth daily. with food 02/23/2018: Patient instructed to hold this medication due to abnormal Potassium level of 6.2 on 02/18/18, per MD, Dr. Glendale Chard. Patient was given verbal education related to complications from hypo and hyperkalemia and importance of following MD recommendations. Patient instructed to resume this medication when directed by PCP. Patient verbalizes understanding.   [name] levothyroxine (SYNTHROID) 125 MCG tablet Take 125 mcg by mouth daily before breakfast.   . magnesium oxide (MAG-OX) 400 MG tablet TAKE 1 TABLET BY MOUTH EVERY DAY IN THE EVENING   . metoprolol succinate (TOPROL-XL) 50 MG 24 hr tablet Take 1 tablet (50 mg total) by mouth daily.   . metoprolol tartrate (LOPRESSOR) 50 MG tablet Take 50 mg by mouth 2 (two) times daily.    [name] omeprazole (PRILOSEC) 20 MG capsule TAKE 1 CAPSULE BY MOUTH  DAILY   . ondansetron (ZOFRAN) 4 MG tablet Take by mouth.   [name] Delica Lancets 17G MISC Use as directed to check blood sugars 2 times per day dx: e11.65   .  pravastatin (PRAVACHOL) 40 MG tablet TAKE 1 TABLET BY MOUTH EVERY DAY   . prednisoLONE acetate (PRED FORTE) 1 % ophthalmic suspension Place 1 drop into the right eye 3 (three) times daily.   . predniSONE (DELTASONE) 10 MG tablet Take by mouth.    . sulfamethoxazole-trimethoprim (BACTRIM DS) 800-160 MG tablet Take 1 tablet by mouth 2 (two) times daily.   [name] telmisartan (MICARDIS) 80 MG tablet TAKE 1 TABLET BY MOUTH  DAILY    . triamcinolone (KENALOG) 0.1 % APPLY TO AFFECTED AREA TWICE A DAY    No facility-administered encounter medications on file as of 02/09/2020.    Patient Active Problem List   Diagnosis Date Noted  . Change in bowel habit 01/20/2020  . Chronic anemia 01/20/2020  . Colon cancer screening 01/20/2020  . Diabetes mellitus with stage 3 chronic kidney disease ([place]) 02/21/2018  . 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 Primary osteoarthritis of both knees 02/21/2018  . Class 3 severe obesity due to excess calories with serious comorbidity and body mass index (BMI) of 45.0 to 49.9 in adult ([place]) 02/21/2018  . Skin ulcer of right foot with fat layer exposed ([place]) 02/18/2018  . Morbid (severe) obesity due to excess calories ([place]) 11/18/2017  . Knee pain 07/16/2015  . Lateral dislocation of right patella 05/04/2015  . Rupture of right quadriceps tendon 05/24/2014  . S/P TKR (total knee replacement) 05/05/2012    Conditions to be addressed/monitored: DMII and CKD Stage III; ADL IADL limitations and Limited access to caregiver  Care Plan : Social Work Care Plan  Updates made by [name] since 02/09/2020 12:00 AM    Problem: Care Coordination     Long-Range Goal: Identify caregiver resources   Start Date: 01/25/2020  Expected End Date: 04/24/2020  This Visit's Progress: On track  Recent Progress: On track  Priority: High  Note:   Current Barriers:  . Limited social support . Level of care concerns . ADL IADL limitations . Limited access to caregiver . Chronic conditions including DM, CKD III, and HTN  Social Work Clinical Goal(s):  [name] Over the next 90 days, patient will work with SW to address concerns related to caregiver resources . Over the next 30 days, patient will follow up with CAP/DA program * as directed by SW  Interventions: . 1:1 collaboration with Glendale Chard, MD regarding development and update of comprehensive plan of care as evidenced by provider  attestation and co-signature . Inter-disciplinary care team collaboration (see longitudinal plan of care) . Communication sent to [name], CMA to determine status of PCS application - awaiting update . Successful outbound call placed to the patient  . Discussed the patient was contacted by the CAP/DA program - patient provided daughters name and number to CAP for application completion . Advised the patient SW awaiting to determine status of PCS application   Patient Goals/Self-Care Activities Over the next 30 days, patient will:   - Patient will self administer medications as prescribed Patient will attend all scheduled provider appointments Patient will call provider office for new concerns or questions Engage with CAP/DA program to initiate application Engaged with [facility] to complete PCS assessment Contact SW as needed prior to next scheduled call  Follow up Plan: SW will follow up with patient by phone over the next month       Goals Addressed              This Visit's Progress     Patient Stated   .  "  I would like to new power Wheelchair to get around better outside of my home" (pt-stated)   On track     [place] (see longitudinal plan of care for additional care plan information)  Current Barriers:  [name] Knowledge Deficits related to determination for best DME to help improve mobility  . Chronic Disease Management support and education needs related to Diabetes Mellitus with stage 3 Chronic Kidney disease, Parenchymal renal hypertension  . Impaired Physical Mobility   Nurse Case Manager Clinical Goal(s):  [name] Over the next 90 days, patient will verbalize understanding of plan for evaluation and determination for best DME to help improve patient's mobility, manual W/C, verses electric W/C, verses power Scooter Goal Met . New 11/15/19 Over the next 180 days, patient will work with the CCM team and PCP to assist with coordinating DME needs to include  a power W/C  CCM SW Interventions Completed 1.26.22 . Successful outbound call placed to the patient to assist with care coordination needs . Discussed the patient has yet to obtain power chair and is concerned that her current chair is not functioning properly . Outbound call placed to [place] to follow up with status of order o Spoke with [facility] in [place] who confirms PT evaluation was completed o Awaiting to hear back fron insurance to determine if a face to face will be needed to conmplete order o Determined if a face to face is needed it can be a virtual appointment  o Discussed Reche Dixon with [facility] has been very involved with process and was recently updated on status o Advised Beth this team will collaborate with Cecille Rubin for future updates  o Reported patient concern that her current chair is not functioning properly o Discussed Beth will reach out to the patient to assess concern and determine if a different chair is available for exchange o Collboration with [place] to provide an update  Patient Self Care Activities:  . Self administers medications as prescribed . Attends all scheduled provider appointments . Calls pharmacy for medication refills . Calls provider office for new concerns or questions  Please see past updates related to this goal by clicking on the "Past Updates" button in the selected goal       Follow Up Plan: SW will follow up with patient by phone over the next month      [name], BSW, CDP Social Worker, Certified Dementia Practitioner [place] [facility] [facility] [phone]  Total time spent performing care coordination and/or care management activities with the patient by phone or face to face = 50 minutes.

## 2020-02-10 ENCOUNTER — Other Ambulatory Visit: Payer: Self-pay

## 2020-02-10 ENCOUNTER — Encounter (HOSPITAL_COMMUNITY): Payer: Medicare Other

## 2020-02-10 ENCOUNTER — Encounter (INDEPENDENT_AMBULATORY_CARE_PROVIDER_SITE_OTHER): Payer: Self-pay | Admitting: Ophthalmology

## 2020-02-10 ENCOUNTER — Telehealth: Payer: Self-pay | Admitting: Internal Medicine

## 2020-02-10 ENCOUNTER — Ambulatory Visit (INDEPENDENT_AMBULATORY_CARE_PROVIDER_SITE_OTHER): Payer: Medicare Other | Admitting: Ophthalmology

## 2020-02-10 ENCOUNTER — Telehealth: Payer: Self-pay

## 2020-02-10 ENCOUNTER — Telehealth: Payer: Medicare Other

## 2020-02-10 DIAGNOSIS — I10 Essential (primary) hypertension: Secondary | ICD-10-CM

## 2020-02-10 DIAGNOSIS — Z961 Presence of intraocular lens: Secondary | ICD-10-CM

## 2020-02-10 DIAGNOSIS — H3581 Retinal edema: Secondary | ICD-10-CM

## 2020-02-10 DIAGNOSIS — H25812 Combined forms of age-related cataract, left eye: Secondary | ICD-10-CM

## 2020-02-10 DIAGNOSIS — H35033 Hypertensive retinopathy, bilateral: Secondary | ICD-10-CM

## 2020-02-10 DIAGNOSIS — H401111 Primary open-angle glaucoma, right eye, mild stage: Secondary | ICD-10-CM | POA: Diagnosis not present

## 2020-02-10 DIAGNOSIS — H34813 Central retinal vein occlusion, bilateral, with macular edema: Secondary | ICD-10-CM | POA: Diagnosis not present

## 2020-02-10 DIAGNOSIS — E113313 Type 2 diabetes mellitus with moderate nonproliferative diabetic retinopathy with macular edema, bilateral: Secondary | ICD-10-CM

## 2020-02-10 NOTE — Telephone Encounter (Cosign Needed)
   02/10/2020  Mary [name] May 24, 1945 [id number]    An unsuccessful telephone outreach was attempted today. The patient was referred to the case management team for assistance with care management and care coordination.   Follow Up Plan: A HIPAA compliant phone message was left for the patient providing contact information and requesting a return call.  Telephone follow up appointment with care management team member scheduled for: 03/29/20  Delsa Sale, RN, BSN, CCM Care Management Coordinator [facility]  Direct Phone: [phone]

## 2020-02-10 NOTE — Progress Notes (Signed)
[facility] Note  02/10/2020     CHIEF COMPLAINT Patient presents for Retina Follow Up   HISTORY OF PRESENT ILLNESS: Mary [name] is a 75 y.o. female who presents to the clinic today for:   HPI    Retina Follow Up    Patient presents with  CRVO/BRVO.  In both eyes.  This started 5 weeks ago.  I, the attending physician,  performed the HPI with the patient and updated documentation appropriately.          Comments    Patient here for 5 weeks retina follow up for CRVO OU. Patient states vision doing good. No eye pain. Uses Timolol BID OD.       Last edited by Bernarda Caffey, MD on 02/13/2020  1:41 AM. (History)    pt c/o tearing, Dr. Coralyn Pear gave samples of Refresh to use.    Referring physician: Glendale Chard, MD [address] [place],  [place] [address]  HISTORICAL INFORMATION:   Selected notes from the MEDICAL RECORD NUMBER Transferred care from Dr. Zigmund Daniel   CURRENT MEDICATIONS: Current Outpatient Medications (Ophthalmic Drugs)  Medication Sig  . dorzolamide-timolol (COSOPT) 22.3-6.8 MG/ML ophthalmic solution Place 1 drop into the right eye 2 (two) times daily.  . fluorometholone (FML) 0.1 % ophthalmic suspension Place 1 drop into the right eye 3 (three) times daily.  . prednisoLONE acetate (PRED FORTE) 1 % ophthalmic suspension Place 1 drop into the right eye 3 (three) times daily.   No current facility-administered medications for this visit. (Ophthalmic Drugs)   Current Outpatient Medications (Other)  Medication Sig  . Assure Comfort Lancets 28G MISC   . Blood Glucose Monitoring Suppl (ONETOUCH VERIO FLEX SYSTEM) w/Device KIT Use as directed to check blood sugars 2 times per day dx: e11.65  . cetirizine (ZYRTEC) 10 MG tablet TAKE 1 TABLET BY MOUTH EVERY DAY  . Dexchlorpheniramine Maleate (RYCLORA) 2 MG/5ML SOLN 5cc twice daily as needed  . diphenoxylate-atropine (LOMOTIL) 2.5-0.025 MG tablet Take by mouth.  .  fluticasone (FLONASE) 50 MCG/ACT nasal spray SPRAY 1 SPRAY INTO EACH NOSTRIL EVERY DAY  . furosemide (LASIX) 40 MG tablet Take 1 tablet (40 mg total) by mouth daily.  [name] glucose blood (ONETOUCH VERIO) test strip Use as directed to check blood sugars 2 times per day dx: e11.65  . HYDROcodone-acetaminophen (NORCO) 10-325 MG tablet Take 1 tablet by mouth 5 (five) times daily as needed.  [name] KLOR-CON M20 20 MEQ tablet Take 20 mEq by mouth daily. with food  . levothyroxine (SYNTHROID) 125 MCG tablet Take 125 mcg by mouth daily before breakfast.  . magnesium oxide (MAG-OX) 400 MG tablet TAKE 1 TABLET BY MOUTH EVERY DAY IN THE EVENING  . metoprolol succinate (TOPROL-XL) 50 MG 24 hr tablet Take 1 tablet (50 mg total) by mouth daily.  . metoprolol tartrate (LOPRESSOR) 50 MG tablet Take 50 mg by mouth 2 (two) times daily.   [name] omeprazole (PRILOSEC) 20 MG capsule TAKE 1 CAPSULE BY MOUTH  DAILY  . ondansetron (ZOFRAN) 4 MG tablet Take by mouth.  [name] Delica Lancets 60A MISC Use as directed to check blood sugars 2 times per day dx: e11.65  . pravastatin (PRAVACHOL) 40 MG tablet TAKE 1 TABLET BY MOUTH EVERY DAY  . predniSONE (DELTASONE) 10 MG tablet Take by mouth.   . sulfamethoxazole-trimethoprim (BACTRIM DS) 800-160 MG tablet Take 1 tablet by mouth 2 (two) times daily.  [name] telmisartan (MICARDIS) 80 MG tablet  TAKE 1 TABLET BY MOUTH  DAILY  . triamcinolone (KENALOG) 0.1 % APPLY TO AFFECTED AREA TWICE A DAY   No current facility-administered medications for this visit. (Other)      REVIEW OF SYSTEMS: ROS    Positive for: Genitourinary, Musculoskeletal, Endocrine, Eyes   Negative for: Constitutional, Gastrointestinal, Neurological, Skin, HENT, Cardiovascular, Respiratory, Psychiatric, Allergic/Imm, Heme/Lymph   Last edited by Theodore Demark, COA on 02/10/2020 10:46 AM. (History)       ALLERGIES No Known Allergies  PAST MEDICAL HISTORY Past Medical History:  Diagnosis Date  . Arthritis     osteoarthritis  . Cataract    OS  . Diabetes mellitus    Insulin dependent  . Diabetic retinopathy ([place])    NPDR OU  . Glaucoma    POAG OD  . H/O cardiovascular stress test    pt. reports 10/13- was normal per pt.   . Hyperlipemia   . Hypertension   . Hypertensive retinopathy    OU   Past Surgical History:  Procedure Laterality Date  . CARPAL TUNNEL RELEASE Bilateral   . CATARACT EXTRACTION Right 10/05/2018   Dr. Kathlen Mody  . EYE SURGERY Right    Cat Sx  . KNEE ARTHROSCOPY     right  . QUADRICEPS TENDON REPAIR  02/17/2012   Procedure: REPAIR QUADRICEP TENDON;  Surgeon: Vickey Huger, MD;  Location: [facility];  Service: Orthopedics;  Laterality: Right;  right quadriceps repair with latera release  . QUADRICEPS TENDON REPAIR Right 05/15/2012   Procedure: REPAIR QUADRICEP TENDON;  Surgeon: Vickey Huger, MD;  Location: [facility];  Service: Orthopedics;  Laterality: Right;  . TOTAL KNEE ARTHROPLASTY  10/21/2011   rt tk  . TOTAL KNEE ARTHROPLASTY  10/21/2011   Procedure: TOTAL KNEE ARTHROPLASTY;  Surgeon: Rudean Haskell, MD;  Location: [facility];  Service: Orthopedics;  Laterality: Right;  . TUBAL LIGATION      FAMILY HISTORY Family History  Problem Relation Age of Onset  . Hypertension Mother   . Heart Problems Father   . Gout Father   . Arthritis Father     SOCIAL HISTORY Social History   Tobacco Use  . Smoking status: Former Smoker    Packs/day: 0.25    Years: 20.00    Pack years: 5.00    Types: Cigarettes    Quit date: 06/28/2014    Years since quitting: 5.6  . Smokeless tobacco: Never Used  Vaping Use  . Vaping Use: Never used  Substance Use Topics  . Alcohol use: No  . Drug use: Yes    Types: Hydrocodone         OPHTHALMIC EXAM:  Base Eye Exam    Visual Acuity (Snellen - Linear)      Right Left   Dist cc 20/60 HM   Dist ph cc 20/50 -2    Correction: Glasses       Tonometry (Tonopen, 10:42 AM)      Right Left   Pressure 16 16       Pupils      Dark Light  Shape React APD   Right 3 2 Round Brisk None   Left 3 2 Round Brisk None       Visual Fields (Counting fingers)      Left Right     Full   Restrictions Total superior temporal, inferior temporal, superior nasal, inferior nasal deficiencies        Extraocular Movement      Right Left  LXT    -- -- --  --  --  -- -- --   -- -- --  --  --  -- -- --         Neuro/Psych    Oriented x3: Yes   Mood/Affect: Normal       Dilation    Both eyes: 1.0% Mydriacyl, 2.5% Phenylephrine @ 10:42 AM        Slit Lamp and Fundus Exam    Slit Lamp Exam      Right Left   Lids/Lashes Dermatochalasis - upper lid, mild Meibomian gland dysfunction Dermatochalasis - upper lid, mild Meibomian gland dysfunction   Conjunctiva/Sclera Melanosis Melanosis   Cornea 2-3+ Punctate epithelial erosions, well healed temporal cataract wounds 3+ Punctate epithelial erosions, well healed temporal cataract wounds   Anterior Chamber Deep and quiet Deep and quiet   Iris Round and moderately dilated to 5.64m Round and moderately dilated to 639m  Lens PC IOL in good position, trace, cortical remnant temporally, PC folds 2-3+ Nuclear sclerosis, 2-3+ Cortical cataract   Vitreous Vitreous syneresis, vitreous condensations, Ozurdex pellet remnants inferiorly, no residual IVT Vitreous syneresis       Fundus Exam      Right Left   Disc  2-3+Pallor, Sharp rim, temporal PPP Mild Pallor, Sharp rim, PPP, severally attenuated vessels   C/D Ratio 0.5 0.3   Macula Blunted foveal reflex, Drusen, Retinal pigment epithelial mottling, temporal edema with focal exudates-- slightly improved Flat, Blunted foveal reflex, RPE mottling and clumping, scarring, atrophy, no heme   Vessels Vascular attenuation, Tortuous Severe Vascular attenuation, +fibrosis   Periphery Attached, no heme    Attached, pavingstone and reticular degeneration inferiorly, scattered RPE atrophy, greatest peripapillary            IMAGING AND PROCEDURES   Imaging and Procedures for @TODAY @  OCT, Retina - OU - Both Eyes       Right Eye Quality was good. Central Foveal Thickness: 216. Progression has improved. Findings include abnormal foveal contour, intraretinal fluid, intraretinal hyper-reflective material, no SRF, vitreomacular adhesion  (Mild interval decrease in IRF ST macula).   Left Eye Quality was good. Central Foveal Thickness: 223. Progression has been stable. Findings include abnormal foveal contour, no IRF, no SRF, outer retinal atrophy, epiretinal membrane, preretinal fibrosis, subretinal hyper-reflective material, pigment epithelial detachment (Stable from prior).   Notes *Images captured and stored on drive  Diagnosis / Impression:  OD: CRVO w/ mild interval decrease in IRF ST macula OS: diffuse ORA - Stable from prior  Clinical management:  See below  Abbreviations: NFP - Normal foveal profile. CME - cystoid macular edema. PED - pigment epithelial detachment. IRF - intraretinal fluid. SRF - subretinal fluid. EZ - ellipsoid zone. ERM - epiretinal membrane. ORA - outer retinal atrophy. ORT - outer retinal tubulation. SRHM - subretinal hyper-reflective material        Intravitreal Injection, Pharmacologic Agent - OD - Right Eye       Time Out 02/10/2020. 11:34 AM. Confirmed correct patient, procedure, site, and patient consented.   Anesthesia Topical anesthesia was used. Anesthetic medications included Lidocaine 2%, Proparacaine 0.5%.   Procedure Preparation included 5% betadine to ocular surface, eyelid speculum. A 27 gauge needle was used.   Injection:  4 mg Triescence 4032mlL injection   NDC: 006775-030-1669ot: 10E2N, Expiration date: 10/13/2020   Route: Intravitreal, Site: Right Eye, Waste: 0.9 mL  Post-op Post injection exam found visual acuity of at least counting fingers. The patient tolerated  the procedure well. There were no complications. The patient received written and verbal post procedure care  education. Post injection medications were not given.   Notes An AC tap was performed following injection due to elevated IOP using a 30 gauge needle on a syringe with the plunger removed. The needle was placed at the limbus at 7 oclock and approximately 0.06 cc of aqueous was removed from the anterior chamber. Betadine was applied to the tap area before and after the paracentesis was performed. There were no complications. The patient tolerated the procedure well. The IOP was rechecked and was found to be ~11 mmHg by palpation.                 ASSESSMENT/PLAN:    ICD-10-CM   1. Central retinal vein occlusion with macular edema of both eyes  H34.8130 Intravitreal Injection, Pharmacologic Agent - OD - Right Eye    triamcinolone acetonide (TRIESENCE) 40 MG/ML subtenons injection 4 mg  2. Moderate nonproliferative diabetic retinopathy of both eyes with macular edema associated with type 2 diabetes mellitus ([place])  B71.6967   3. Retinal edema  H35.81 OCT, Retina - OU - Both Eyes  4. Essential hypertension  I10   5. Hypertensive retinopathy of both eyes  H35.033   6. Combined forms of age-related cataract of left eye  H25.812   7. Pseudophakia  Z96.1   8. Primary open angle glaucoma (POAG) of right eye, mild stage  H40.1111    1. CRVO w/ CME OU  - pt of Dr. Zigmund Daniel who wished to transfer care  - OS long-standing low vision (HM) with subfoveal scar and central retinal atrophy  - OD with CME actively being managed with Ozurdex OD ~q5 wks by Dr. Zigmund Daniel (last on 8.20.20) s/p multiple IVTA and Ozurdex OD  - was due for Ozurdex OD w/ Zigmund Daniel on 9.25.20, but underwent cataract surgery OD w/ Kathlen Mody on 9.21.20  - s/p IVTA OD #4 (10.26.20), #5 (12.10.20), #6 (01.21.21), #7 (03.05.21), #8 (04.16.21), #9 (05.28.21), #10 (07.09.21), #11 (09.14.21), #12 (10.29.21), #13 (12.17.21)  - BCVA stable at 20/50   - exam shows no residual IVTA in vitreous cavity  - OCT shows interval decrease in IRF ST  macula and fovea at 6 wks  - recommend IVTA OD #14 today, 01.27.22  - pt wishes to proceed  - RBA of procedure discussed, questions answered  - informed consent obtained  - see procedure note -- AC tap  - f/u in 6 wks -- DFE/OCT/possible injection  2,3. Severe Non-proliferative diabetic retinopathy OU.  - OCT shows diabetic macular edema, OD   - recommend IVTA OD today, 01.27.22 as above for CRVO  4,5. Hypertensive retinopathy OU  - discussed importance of tight BP control  - monitor  6. Mixed form age related cataract OS  - under the expert management of Dr. Kathlen Mody  7. Pseudophakia OD  - s/p CE/IOL OD (9.21.20) by expert surgeon, Dr. Kathlen Mody  - IOL in excellent position  - had post op IOP spike -- IOP now under better control  - monitor  8. POAG OD  - under the expert management of Dr. Kathlen Mody  - s/p SLT and goniotomy OD  - had post-cataract IOP spike  - IOP 16 today  - AC tap post IVTA injxn  Ophthalmic Meds Ordered this visit:  Meds ordered this encounter  Medications  . triamcinolone acetonide (TRIESENCE) 40 MG/ML subtenons injection 4 mg       Return for Return in  6 weeks for DFE, OCT, possible injection.  There are no Patient Instructions on file for this visit.   Explained the diagnoses, plan, and follow up with the patient and they expressed understanding.  Patient expressed understanding of the importance of proper follow up care.   This document serves as a record of services personally performed by Gardiner Sleeper, MD, PhD. It was created on their behalf by Leonie Douglas, an ophthalmic technician. The creation of this record is the provider's dictation and/or activities during the visit.    Electronically signed by: Leonie Douglas COA, 02/13/20  1:43 AM  Gardiner Sleeper, M.D., Ph.D. Diseases & Surgery of the Retina and [place] 02/10/2020  I have reviewed the above documentation for accuracy and completeness, and I agree  with the above. Gardiner Sleeper, M.D., Ph.D. 02/13/20 1:45 AM   Abbreviations: M myopia (nearsighted); A astigmatism; H hyperopia (farsighted); P presbyopia; Mrx spectacle prescription;  CTL contact lenses; OD right eye; OS left eye; OU both eyes  XT exotropia; ET esotropia; PEK punctate epithelial keratitis; PEE punctate epithelial erosions; DES dry eye syndrome; MGD meibomian gland dysfunction; ATs artificial tears; PFAT's preservative free artificial tears; [place] nuclear sclerotic cataract; PSC posterior subcapsular cataract; ERM epi-retinal membrane; PVD posterior vitreous detachment; RD retinal detachment; DM diabetes mellitus; DR diabetic retinopathy; NPDR non-proliferative diabetic retinopathy; PDR proliferative diabetic retinopathy; CSME clinically significant macular edema; DME diabetic macular edema; dbh dot blot hemorrhages; CWS cotton wool spot; POAG primary open angle glaucoma; C/D cup-to-disc ratio; HVF humphrey visual field; GVF goldmann visual field; OCT optical coherence tomography; IOP intraocular pressure; BRVO Branch retinal vein occlusion; CRVO central retinal vein occlusion; CRAO central retinal artery occlusion; BRAO branch retinal artery occlusion; RT retinal tear; SB scleral buckle; PPV pars plana vitrectomy; VH Vitreous hemorrhage; PRP panretinal laser photocoagulation; IVK intravitreal kenalog; VMT vitreomacular traction; MH Macular hole;  NVD neovascularization of the disc; NVE neovascularization elsewhere; AREDS age related eye disease study; ARMD age related macular degeneration; POAG primary open angle glaucoma; EBMD epithelial/anterior basement membrane dystrophy; ACIOL anterior chamber intraocular lens; IOL intraocular lens; PCIOL posterior chamber intraocular lens; Phaco/IOL phacoemulsification with intraocular lens placement; [place] photorefractive keratectomy; LASIK laser assisted in situ keratomileusis; HTN hypertension; DM diabetes mellitus; COPD chronic obstructive pulmonary  disease

## 2020-02-10 NOTE — Telephone Encounter (Signed)
Left message for patient to call back and schedule Medicare Annual Wellness Visit (AWV)   Calender year uhc  Last AWV 09/02/19  ; please schedule at anytime with [facility]    This should be a 45 minute visit.

## 2020-02-11 ENCOUNTER — Encounter (HOSPITAL_COMMUNITY): Payer: Self-pay

## 2020-02-11 ENCOUNTER — Ambulatory Visit (HOSPITAL_COMMUNITY): Payer: Medicare Other | Admitting: Physical Therapy

## 2020-02-11 ENCOUNTER — Telehealth (HOSPITAL_COMMUNITY): Payer: Self-pay | Admitting: Physical Therapy

## 2020-02-11 NOTE — Telephone Encounter (Signed)
S/w granddaughter Howell Rucks -they request to cx all future apptments until [facility] and provide transportation. Sent note to therapist to put patient on hold.

## 2020-02-13 ENCOUNTER — Encounter (INDEPENDENT_AMBULATORY_CARE_PROVIDER_SITE_OTHER): Payer: Self-pay | Admitting: Ophthalmology

## 2020-02-13 DIAGNOSIS — H34813 Central retinal vein occlusion, bilateral, with macular edema: Secondary | ICD-10-CM | POA: Diagnosis not present

## 2020-02-13 DIAGNOSIS — H35033 Hypertensive retinopathy, bilateral: Secondary | ICD-10-CM | POA: Diagnosis not present

## 2020-02-13 MED ORDER — TRIAMCINOLONE ACETONIDE 40 MG/ML IO SUSP
4.0000 mg | INTRAOCULAR | Status: AC | PRN
  Administered 2020-02-13: 4 mg via INTRAVITREAL

## 2020-02-14 ENCOUNTER — Ambulatory Visit (HOSPITAL_COMMUNITY): Payer: Medicare Other | Admitting: Physical Therapy

## 2020-02-14 ENCOUNTER — Other Ambulatory Visit: Payer: Self-pay

## 2020-02-14 NOTE — Patient Outreach (Signed)
Aging Gracefully Program  02/14/2020  Mary [name] 12/31/1945 [id number]  [facility] Evaluation Interviewer made contact with patient. Aging Gracefully survey completed 5 month evaluation.   Interviewer will send follow up back to [name] at [employer].  [name] Care Management Assistant [phone]

## 2020-02-15 ENCOUNTER — Ambulatory Visit (INDEPENDENT_AMBULATORY_CARE_PROVIDER_SITE_OTHER): Payer: Medicare Other

## 2020-02-15 DIAGNOSIS — Z6841 Body Mass Index (BMI) 40.0 and over, adult: Secondary | ICD-10-CM

## 2020-02-15 DIAGNOSIS — E1122 Type 2 diabetes mellitus with diabetic chronic kidney disease: Secondary | ICD-10-CM

## 2020-02-15 NOTE — Chronic Care Management (AMB) (Signed)
Chronic Care Management    Social Work Note  02/15/2020 Name: Mary [name] MRN: [id number] DOB: [birth year]  Mary [name] is a 75 y.o. year old female who is a primary care patient of Glendale Chard, MD. The CCM team was consulted to assist the patient with chronic disease management and/or care coordination needs related to: Transportation Needs  and [employer] .   Engaged with patient by telephone for follow up visit in response to provider referral for social work chronic care management and care coordination services.   Consent to Services:  The patient was given information about Chronic Care Management services, agreed to services, and gave verbal consent prior to initiation of services.  Please see initial visit note for detailed documentation.   Patient agreed to services and consent obtained.   Assessment: Review of patient past medical history, allergies, medications, and health status, including review of relevant consultants reports was performed today as part of a comprehensive evaluation and provision of chronic care management and care coordination services.     SDOH (Social Determinants of Health) assessments and interventions performed:    Advanced Directives Status: Not addressed in this encounter.  CCM Care Plan  No Known Allergies  Outpatient Encounter Medications as of 02/15/2020  Medication Sig Note  . Assure Comfort Lancets 28G MISC    . Blood Glucose Monitoring Suppl (ONETOUCH VERIO FLEX SYSTEM) w/Device KIT Use as directed to check blood sugars 2 times per day dx: e11.65   . cetirizine (ZYRTEC) 10 MG tablet TAKE 1 TABLET BY MOUTH EVERY DAY   . Dexchlorpheniramine Maleate (RYCLORA) 2 MG/5ML SOLN 5cc twice daily as needed   . diphenoxylate-atropine (LOMOTIL) 2.5-0.025 MG tablet Take by mouth.   . dorzolamide-timolol (COSOPT) 22.3-6.8 MG/ML ophthalmic solution Place 1 drop into the right eye 2 (two) times daily.   . fluorometholone (FML) 0.1 %  ophthalmic suspension Place 1 drop into the right eye 3 (three) times daily.   . fluticasone (FLONASE) 50 MCG/ACT nasal spray SPRAY 1 SPRAY INTO EACH NOSTRIL EVERY DAY   . furosemide (LASIX) 40 MG tablet Take 1 tablet (40 mg total) by mouth daily.   [name] glucose blood (ONETOUCH VERIO) test strip Use as directed to check blood sugars 2 times per day dx: e11.65   . HYDROcodone-acetaminophen (NORCO) 10-325 MG tablet Take 1 tablet by mouth 5 (five) times daily as needed.   [name] KLOR-CON M20 20 MEQ tablet Take 20 mEq by mouth daily. with food 02/23/2018: Patient instructed to hold this medication due to abnormal Potassium level of 6.2 on 02/18/18, per MD, Dr. Glendale Chard. Patient was given verbal education related to complications from hypo and hyperkalemia and importance of following MD recommendations. Patient instructed to resume this medication when directed by PCP. Patient verbalizes understanding.   [name] levothyroxine (SYNTHROID) 125 MCG tablet Take 125 mcg by mouth daily before breakfast.   . magnesium oxide (MAG-OX) 400 MG tablet TAKE 1 TABLET BY MOUTH EVERY DAY IN THE EVENING   . metoprolol succinate (TOPROL-XL) 50 MG 24 hr tablet Take 1 tablet (50 mg total) by mouth daily.   . metoprolol tartrate (LOPRESSOR) 50 MG tablet Take 50 mg by mouth 2 (two) times daily.    [name] omeprazole (PRILOSEC) 20 MG capsule TAKE 1 CAPSULE BY MOUTH  DAILY   . ondansetron (ZOFRAN) 4 MG tablet Take by mouth.   [name] Delica Lancets 72C MISC Use as directed to check blood sugars 2 times per day dx: e11.65   .  pravastatin (PRAVACHOL) 40 MG tablet TAKE 1 TABLET BY MOUTH EVERY DAY   . prednisoLONE acetate (PRED FORTE) 1 % ophthalmic suspension Place 1 drop into the right eye 3 (three) times daily.   . predniSONE (DELTASONE) 10 MG tablet Take by mouth.    . sulfamethoxazole-trimethoprim (BACTRIM DS) 800-160 MG tablet Take 1 tablet by mouth 2 (two) times daily.   [name] telmisartan (MICARDIS) 80 MG tablet TAKE 1 TABLET BY MOUTH  DAILY    . triamcinolone (KENALOG) 0.1 % APPLY TO AFFECTED AREA TWICE A DAY    No facility-administered encounter medications on file as of 02/15/2020.    Patient Active Problem List   Diagnosis Date Noted  . Change in bowel habit 01/20/2020  . Chronic anemia 01/20/2020  . Colon cancer screening 01/20/2020  . Diabetes mellitus with stage 3 chronic kidney disease ([place]) 02/21/2018  . 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 Primary osteoarthritis of both knees 02/21/2018  . Class 3 severe obesity due to excess calories with serious comorbidity and body mass index (BMI) of 45.0 to 49.9 in adult ([place]) 02/21/2018  . Skin ulcer of right foot with fat layer exposed ([place]) 02/18/2018  . Morbid (severe) obesity due to excess calories ([address]) 11/18/2017  . Knee pain 07/16/2015  . Lateral dislocation of right patella 05/04/2015  . Rupture of right quadriceps tendon 05/24/2014  . S/P TKR (total knee replacement) 05/05/2012    Conditions to be addressed/monitored: DMII, CKD Stage III and Obestiy; Transportation and Limited access to caregiver  Care Plan : Social Work Care Plan  Updates made by [name] since 02/15/2020 12:00 AM    Problem: Care Coordination     Long-Range Goal: Identify caregiver resources   Start Date: 01/25/2020  Expected End Date: 04/24/2020  This Visit's Progress: On track  Recent Progress: On track  Priority: High  Note:   Current Barriers:  . Limited social support . Level of care concerns . ADL IADL limitations . Limited access to caregiver . Chronic conditions including DM, CKD III, and HTN  Social Work Clinical Goal(s):  [name] Over the next 90 days, patient will work with SW to address concerns related to caregiver resources . Over the next 30 days, patient will follow up with CAP/DA program * as directed by SW  Interventions: . 1:1 collaboration with Glendale Chard, MD regarding development and update of comprehensive plan of care as evidenced by provider  attestation and co-signature . Inter-disciplinary care team collaboration (see longitudinal plan of care) . Collaboration with [name], [place] who reports PCS application faxed to [facility] . Successful outbound call placed to the patient and her daughter to inform of application status . Advised the patient to expect a call to scheduled an assessment . Requested patient and/or her daughter contact the SW if they do not receive contact by the end of the week  Patient Goals/Self-Care Activities Over the next 30 days, patient will:   - Patient will self administer medications as prescribed Patient will attend all scheduled provider appointments Patient will call provider office for new concerns or questions   Engaged with [facility] to complete PCS assessment Contact SW as needed prior to next scheduled call  Follow up Plan: SW will follow up with patient by phone over the next month    Long-Range Goal: Identify resources to assist with care coordination needs   Start Date: 02/15/2020  Expected End Date: 05/15/2020  Priority: High  Note:   Current Barriers:  .  Limited social support . Transportation . ADL IADL limitations . Chronic conditions including DM II, CKD III, and obesity which put patient at increased risk for hospitalization   Social Work Clinical Goal(s):  [name] Over the next 90 days, patient will work with SW to address concerns related to care coordination  Interventions: . 1:1 collaboration with Glendale Chard, MD regarding development and update of comprehensive plan of care as evidenced by provider attestation and co-signature . Inter-disciplinary care team collaboration (see longitudinal plan of care) . Successful outbound call placed to the patient to assist with care coordination needs . Determined the patient is no longer able to attend outpatient rehab clinic at [facility] due to transportation concerns . Patient reports she was using [facility]  transportation but was recently told she could no longer access due to weight . Collaboration with [employer] to request feedback - discussed the patient will need a family member to assist up/down ramp due to weight and safety concern for staff. Patient will also need updated weight to ensure she is not above weight requirement . Collaboration with Reche Dixon with [facility] to determine ability to order home health - Cecille Rubin reports home health orders were attempted last month and 3 agencies declined due to staffing concerns . Cecille Rubin reports she has submitted all paperwork to [facility] for power wheel chair- Cecille Rubin will attempt to contact [employer] for weight of new chair . Collaboration with [facility] transportation to request feedback regarding plan if patient can not get home health but can obtain power chair . Collaboration with PCP to provide an update and request home health orders . Scheduled follow up call over the next two weeks  Patient Goals/Self-Care Activities Over the next 30 days, patient will:   - Patient will self administer medications as prescribed Patient will attend all scheduled provider appointments Patient will call provider office for new concerns or questions Contact SW as needed prior to next scheduled call  Follow up Plan: SW will follow up with patient by phone over the next two weeks       Follow Up Plan: SW will follow up with patient by phone over the next two weeks      [name], BSW, CDP Social Worker, Certified Dementia Practitioner [place] [facility] [place] [facility] [phone]  Total time spent performing care coordination and/or care management activities with the patient by phone or face to face = 30 minutes.

## 2020-02-15 NOTE — Patient Instructions (Signed)
Goals we discussed today:  Goals Addressed            This Visit's Progress   . Identify caregiver resources   On track    Timeframe:  Long-Range Goal Priority:  High Start Date:  1.11.22                           Expected End Date:  4.11.22                     Next planned outreach date: 2.14.22  Patient Goals/Self-Care Activities Over the next 30 days, patient will:   - Patient will self administer medications as prescribed Patient will attend all scheduled provider appointments Patient will call provider office for new concerns or questions Engaged with [facility] to complete PCS assessment Contact SW as needed prior to next scheduled call    . wotk with SW to manage care coordination needs       Timeframe:  Long-Range Goal Priority:  High Start Date:  2.1.22                          Expected End Date: 5.2.22                       Next planned outreach: 2.14.22  Patient Goals/Self-Care Activities Over the next 30 days, patient will:   - Patient will self administer medications as prescribed Patient will attend all scheduled provider appointments Patient will call provider office for new concerns or questions Contact SW as needed prior to next scheduled call

## 2020-02-16 ENCOUNTER — Ambulatory Visit (HOSPITAL_COMMUNITY): Payer: Medicare Other | Admitting: Physical Therapy

## 2020-02-18 ENCOUNTER — Encounter (HOSPITAL_COMMUNITY): Payer: Medicare Other

## 2020-02-21 DIAGNOSIS — M25512 Pain in left shoulder: Secondary | ICD-10-CM | POA: Diagnosis not present

## 2020-02-21 DIAGNOSIS — M150 Primary generalized (osteo)arthritis: Secondary | ICD-10-CM | POA: Diagnosis not present

## 2020-02-21 DIAGNOSIS — M25561 Pain in right knee: Secondary | ICD-10-CM | POA: Diagnosis not present

## 2020-02-21 DIAGNOSIS — G894 Chronic pain syndrome: Secondary | ICD-10-CM | POA: Diagnosis not present

## 2020-02-22 ENCOUNTER — Encounter (HOSPITAL_COMMUNITY): Payer: Medicare Other

## 2020-02-24 ENCOUNTER — Encounter (HOSPITAL_COMMUNITY): Payer: Medicare Other

## 2020-02-25 ENCOUNTER — Encounter (HOSPITAL_COMMUNITY): Payer: Medicare Other | Admitting: Physical Therapy

## 2020-02-28 ENCOUNTER — Ambulatory Visit: Payer: Medicare Other

## 2020-02-28 ENCOUNTER — Encounter (HOSPITAL_COMMUNITY): Payer: Medicare Other | Admitting: Physical Therapy

## 2020-02-28 DIAGNOSIS — E1122 Type 2 diabetes mellitus with diabetic chronic kidney disease: Secondary | ICD-10-CM

## 2020-02-28 DIAGNOSIS — N183 Chronic kidney disease, stage 3 unspecified: Secondary | ICD-10-CM

## 2020-02-28 DIAGNOSIS — L89623 Pressure ulcer of left heel, stage 3: Secondary | ICD-10-CM | POA: Diagnosis not present

## 2020-02-28 NOTE — Chronic Care Management (AMB) (Signed)
Chronic Care Management    Social Work Note  02/28/2020 Name: Mary [name] MRN: [id number] DOB: 1945-07-20  Mary [name] is a 75 y.o. year old female who is a primary care patient of Glendale Chard, MD. The CCM team was consulted to assist the patient with chronic disease management and/or care coordination needs related to: Transportation Needs  and [employer] .   Engaged with patient by telephone for follow up visit in response to provider referral for social work chronic care management and care coordination services.   Consent to Services:  The patient was given information about Chronic Care Management services, agreed to services, and gave verbal consent prior to initiation of services.  Please see initial visit note for detailed documentation.   Patient agreed to services and consent obtained.   Assessment: Review of patient past medical history, allergies, medications, and health status, including review of relevant consultants reports was performed today as part of a comprehensive evaluation and provision of chronic care management and care coordination services.     SDOH (Social Determinants of Health) assessments and interventions performed:    Advanced Directives Status: Not addressed in this encounter.  CCM Care Plan  No Known Allergies  Outpatient Encounter Medications as of 02/28/2020  Medication Sig Note  . Assure Comfort Lancets 28G MISC    . Blood Glucose Monitoring Suppl (ONETOUCH VERIO FLEX SYSTEM) w/Device KIT Use as directed to check blood sugars 2 times per day dx: e11.65   . cetirizine (ZYRTEC) 10 MG tablet TAKE 1 TABLET BY MOUTH EVERY DAY   . Dexchlorpheniramine Maleate (RYCLORA) 2 MG/5ML SOLN 5cc twice daily as needed   . diphenoxylate-atropine (LOMOTIL) 2.5-0.025 MG tablet Take by mouth.   . dorzolamide-timolol (COSOPT) 22.3-6.8 MG/ML ophthalmic solution Place 1 drop into the right eye 2 (two) times daily.   . fluorometholone (FML) 0.1 %  ophthalmic suspension Place 1 drop into the right eye 3 (three) times daily.   . fluticasone (FLONASE) 50 MCG/ACT nasal spray SPRAY 1 SPRAY INTO EACH NOSTRIL EVERY DAY   . furosemide (LASIX) 40 MG tablet Take 1 tablet (40 mg total) by mouth daily.   [name] glucose blood (ONETOUCH VERIO) test strip Use as directed to check blood sugars 2 times per day dx: e11.65   . HYDROcodone-acetaminophen (NORCO) 10-325 MG tablet Take 1 tablet by mouth 5 (five) times daily as needed.   [name] KLOR-CON M20 20 MEQ tablet Take 20 mEq by mouth daily. with food 02/23/2018: Patient instructed to hold this medication due to abnormal Potassium level of 6.2 on 02/18/18, per MD, Dr. Glendale Chard. Patient was given verbal education related to complications from hypo and hyperkalemia and importance of following MD recommendations. Patient instructed to resume this medication when directed by PCP. Patient verbalizes understanding.   [name] levothyroxine (SYNTHROID) 125 MCG tablet Take 125 mcg by mouth daily before breakfast.   . magnesium oxide (MAG-OX) 400 MG tablet TAKE 1 TABLET BY MOUTH EVERY DAY IN THE EVENING   . metoprolol succinate (TOPROL-XL) 50 MG 24 hr tablet Take 1 tablet (50 mg total) by mouth daily.   . metoprolol tartrate (LOPRESSOR) 50 MG tablet Take 50 mg by mouth 2 (two) times daily.    [name] omeprazole (PRILOSEC) 20 MG capsule TAKE 1 CAPSULE BY MOUTH  DAILY   . ondansetron (ZOFRAN) 4 MG tablet Take by mouth.   [name] Delica Lancets 88T MISC Use as directed to check blood sugars 2 times per day dx: e11.65   .  pravastatin (PRAVACHOL) 40 MG tablet TAKE 1 TABLET BY MOUTH EVERY DAY   . prednisoLONE acetate (PRED FORTE) 1 % ophthalmic suspension Place 1 drop into the right eye 3 (three) times daily.   . predniSONE (DELTASONE) 10 MG tablet Take by mouth.    . sulfamethoxazole-trimethoprim (BACTRIM DS) 800-160 MG tablet Take 1 tablet by mouth 2 (two) times daily.   [name] telmisartan (MICARDIS) 80 MG tablet TAKE 1 TABLET BY MOUTH  DAILY    . triamcinolone (KENALOG) 0.1 % APPLY TO AFFECTED AREA TWICE A DAY    No facility-administered encounter medications on file as of 02/28/2020.    Patient Active Problem List   Diagnosis Date Noted  . Change in bowel habit 01/20/2020  . Chronic anemia 01/20/2020  . Colon cancer screening 01/20/2020  . Diabetes mellitus with stage 3 chronic kidney disease ([place]) 02/21/2018  . 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 Primary osteoarthritis of both knees 02/21/2018  . Class 3 severe obesity due to excess calories with serious comorbidity and body mass index (BMI) of 45.0 to 49.9 in adult ([place]) 02/21/2018  . Skin ulcer of right foot with fat layer exposed ([place]) 02/18/2018  . Morbid (severe) obesity due to excess calories ([place]) 11/18/2017  . Knee pain 07/16/2015  . Lateral dislocation of right patella 05/04/2015  . Rupture of right quadriceps tendon 05/24/2014  . S/P TKR (total knee replacement) 05/05/2012    Conditions to be addressed/monitored: DMII and CKD Stage III; Transportation and care coordination  Care Plan : Social Work Care Plan  Updates made by [name] since 02/28/2020 12:00 AM    Problem: Care Coordination     Long-Range Goal: Identify caregiver resources   Start Date: 01/25/2020  Expected End Date: 04/24/2020  Recent Progress: On track  Priority: High  Note:   Current Barriers:  . Limited social support . Level of care concerns . ADL IADL limitations . Limited access to caregiver . Chronic conditions including DM, CKD III, and HTN  Social Work Clinical Goal(s):  [name] Over the next 90 days, patient will work with SW to address concerns related to caregiver resources . Over the next 30 days, patient will follow up with CAP/DA program * as directed by SW  Interventions: . 1:1 collaboration with Glendale Chard, MD regarding development and update of comprehensive plan of care as evidenced by provider attestation and co-signature . Inter-disciplinary care team  collaboration (see longitudinal plan of care) . Successful outbound call placed to the patient to assess goal progression . Determined the patient received a letter from [facility] indicating she does not qualify for PCS due to the patient not having full Medicaid . Discussed the patients daughter is continuing to seek assistance from CAP/DA . Scheduled follow up call over the next two weeks  Patient Goals/Self-Care Activities Over the next 30 days, patient will:   - Patient will self administer medications as prescribed Patient will attend all scheduled provider appointments Patient will call provider office for new concerns or questions Contact SW as needed prior to next scheduled call   Follow up Plan: SW will follow up with patient by phone over the next two weeks    Long-Range Goal: Identify resources to assist with care coordination needs   Start Date: 02/15/2020  Expected End Date: 05/15/2020  Priority: High  Note:   Current Barriers:  . Limited social support . Transportation . ADL IADL limitations . Chronic conditions including DM II, CKD III, and obesity which put  patient at increased risk for hospitalization   Social Work Clinical Goal(s):  [name] Over the next 90 days, patient will work with SW to address concerns related to care coordination  Interventions: . 1:1 collaboration with Glendale Chard, MD regarding development and update of comprehensive plan of care as evidenced by provider attestation and co-signature . Inter-disciplinary care team collaboration (see longitudinal plan of care) . Successful outbound call placed to the patient to assess goal progression . Determined the patient has yet to receive wheelchair from [place] . Patient reports she does not have home health at this time but [facility] RN came out on Saturday to evaluate foot wound and reports it is healing and now scabbed over . Discussed plans for SW to collaborate with Reche Dixon   . Unsuccessful outbound call placed to Reche Dixon, voice message left requesting a return call . SW will follow up with the patient over the next two weeks  Patient Goals/Self-Care Activities Over the next 30 days, patient will:   - Patient will self administer medications as prescribed Patient will attend all scheduled provider appointments Patient will call provider office for new concerns or questions Contact SW as needed prior to next scheduled call  Follow up Plan: SW will follow up with patient by phone over the next two weeks       Follow Up Plan: SW will follow up with patient by phone over the next two weeks      [name], BSW, CDP Social Worker, Certified Dementia Practitioner [place] [facility] [place] [facility] [phone]  Total time spent performing care coordination and/or care management activities with the patient by phone or face to face = 16 minutes.

## 2020-02-28 NOTE — Patient Instructions (Signed)
Goals we discussed today:  Goals Addressed            This Visit's Progress   . Identify caregiver resources       Timeframe:  Long-Range Goal Priority:  High Start Date:  1.11.22                           Expected End Date:  4.11.22                     Next planned outreach date: 2.23.22  Patient Goals/Self-Care Activities Over the next 30 days, patient will:   - Patient will self administer medications as prescribed Patient will attend all scheduled provider appointments Patient will call provider office for new concerns or questions Contact SW as needed prior to next scheduled call    . wotk with SW to manage care coordination needs       Timeframe:  Long-Range Goal Priority:  High Start Date:  2.1.22                          Expected End Date: 5.2.22                       Next planned outreach: 2.23.22  Patient Goals/Self-Care Activities Over the next 30 days, patient will:   - Patient will self administer medications as prescribed Patient will attend all scheduled provider appointments Patient will call provider office for new concerns or questions Contact SW as needed prior to next scheduled call

## 2020-03-01 ENCOUNTER — Encounter (HOSPITAL_COMMUNITY): Payer: Medicare Other

## 2020-03-03 ENCOUNTER — Encounter (HOSPITAL_COMMUNITY): Payer: Medicare Other | Admitting: Physical Therapy

## 2020-03-06 ENCOUNTER — Ambulatory Visit: Payer: Medicare Other

## 2020-03-06 DIAGNOSIS — E1122 Type 2 diabetes mellitus with diabetic chronic kidney disease: Secondary | ICD-10-CM

## 2020-03-06 DIAGNOSIS — N183 Chronic kidney disease, stage 3 unspecified: Secondary | ICD-10-CM

## 2020-03-06 DIAGNOSIS — R269 Unspecified abnormalities of gait and mobility: Secondary | ICD-10-CM

## 2020-03-06 NOTE — Patient Instructions (Signed)
  Goals we discussed today:  Goals Addressed            This Visit's Progress   . wotk with SW to manage care coordination needs       Timeframe:  Long-Range Goal Priority:  High Start Date:  2.1.22                          Expected End Date: 5.2.22                       Next planned outreach: 2.23.22  Patient Goals/Self-Care Activities Over the next 30 days, patient will:   - Patient will self administer medications as prescribed -Patient will attend all scheduled provider appointments -Patient will call provider office for new concerns or questions -Contact SW as needed prior to next scheduled call

## 2020-03-06 NOTE — Chronic Care Management (AMB) (Signed)
Chronic Care Management    Social Work Note  03/06/2020 Name: Mary [name] MRN: [id number] DOB: 12/01/1945  Mary [name] is a 75 y.o. year old female who is a primary care patient of Sanders, Robyn, MD. The CCM team was consulted to assist the patient with chronic disease management and/or care coordination needs related to: [employer] .   Engaged with patient by telephone for follow up visit in response to provider referral for social work chronic care management and care coordination services.   Consent to Services:  The patient was given information about Chronic Care Management services, agreed to services, and gave verbal consent prior to initiation of services.  Please see initial visit note for detailed documentation.   Patient agreed to services and consent obtained.   Assessment: Review of patient past medical history, allergies, medications, and health status, including review of relevant consultants reports was performed today as part of a comprehensive evaluation and provision of chronic care management and care coordination services.     SDOH (Social Determinants of Health) assessments and interventions performed:    Advanced Directives Status: Not addressed in this encounter.  CCM Care Plan  No Known Allergies  Outpatient Encounter Medications as of 03/06/2020  Medication Sig Note  . Assure Comfort Lancets 28G MISC    . Blood Glucose Monitoring Suppl (ONETOUCH VERIO FLEX SYSTEM) w/Device KIT Use as directed to check blood sugars 2 times per day dx: e11.65   . cetirizine (ZYRTEC) 10 MG tablet TAKE 1 TABLET BY MOUTH EVERY DAY   . Dexchlorpheniramine Maleate (RYCLORA) 2 MG/5ML SOLN 5cc twice daily as needed   . diphenoxylate-atropine (LOMOTIL) 2.5-0.025 MG tablet Take by mouth.   . dorzolamide-timolol (COSOPT) 22.3-6.8 MG/ML ophthalmic solution Place 1 drop into the right eye 2 (two) times daily.   . fluorometholone (FML) 0.1 % ophthalmic suspension Place 1  drop into the right eye 3 (three) times daily.   . fluticasone (FLONASE) 50 MCG/ACT nasal spray SPRAY 1 SPRAY INTO EACH NOSTRIL EVERY DAY   . furosemide (LASIX) 40 MG tablet Take 1 tablet (40 mg total) by mouth daily.   . glucose blood (ONETOUCH VERIO) test strip Use as directed to check blood sugars 2 times per day dx: e11.65   . HYDROcodone-acetaminophen (NORCO) 10-325 MG tablet Take 1 tablet by mouth 5 (five) times daily as needed.   . KLOR-CON M20 20 MEQ tablet Take 20 mEq by mouth daily. with food 02/23/2018: Patient instructed to hold this medication due to abnormal Potassium level of 6.2 on 02/18/18, per MD, Dr. Robyn Sanders. Patient was given verbal education related to complications from hypo and hyperkalemia and importance of following MD recommendations. Patient instructed to resume this medication when directed by PCP. Patient verbalizes understanding.   . levothyroxine (SYNTHROID) 125 MCG tablet Take 125 mcg by mouth daily before breakfast.   . magnesium oxide (MAG-OX) 400 MG tablet TAKE 1 TABLET BY MOUTH EVERY DAY IN THE EVENING   . metoprolol succinate (TOPROL-XL) 50 MG 24 hr tablet Take 1 tablet (50 mg total) by mouth daily.   . metoprolol tartrate (LOPRESSOR) 50 MG tablet Take 50 mg by mouth 2 (two) times daily.    . omeprazole (PRILOSEC) 20 MG capsule TAKE 1 CAPSULE BY MOUTH  DAILY   . ondansetron (ZOFRAN) 4 MG tablet Take by mouth.   . OneTouch Delica Lancets 33G MISC Use as directed to check blood sugars 2 times per day dx: e11.65   . pravastatin (PRAVACHOL)   40 MG tablet TAKE 1 TABLET BY MOUTH EVERY DAY   . prednisoLONE acetate (PRED FORTE) 1 % ophthalmic suspension Place 1 drop into the right eye 3 (three) times daily.   . predniSONE (DELTASONE) 10 MG tablet Take by mouth.    . sulfamethoxazole-trimethoprim (BACTRIM DS) 800-160 MG tablet Take 1 tablet by mouth 2 (two) times daily.   [name] telmisartan (MICARDIS) 80 MG tablet TAKE 1 TABLET BY MOUTH  DAILY   . triamcinolone (KENALOG)  0.1 % APPLY TO AFFECTED AREA TWICE A DAY    No facility-administered encounter medications on file as of 03/06/2020.    Patient Active Problem List   Diagnosis Date Noted  . Change in bowel habit 01/20/2020  . Chronic anemia 01/20/2020  . Colon cancer screening 01/20/2020  . Diabetes mellitus with stage 3 chronic kidney disease ([place]) 02/21/2018  . 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 Primary osteoarthritis of both knees 02/21/2018  . Class 3 severe obesity due to excess calories with serious comorbidity and body mass index (BMI) of 45.0 to 49.9 in adult ([place]) 02/21/2018  . Skin ulcer of right foot with fat layer exposed ([place]) 02/18/2018  . Morbid (severe) obesity due to excess calories ([place]) 11/18/2017  . Knee pain 07/16/2015  . Lateral dislocation of right patella 05/04/2015  . Rupture of right quadriceps tendon 05/24/2014  . S/P TKR (total knee replacement) 05/05/2012    Conditions to be addressed/monitored: DMII and CKD Stage III; ADL IADL limitations and care coordination  Care Plan : Social Work Care Plan  Updates made by [name] since 03/06/2020 12:00 AM    Problem: Care Coordination     Long-Range Goal: Identify resources to assist with care coordination needs   Start Date: 02/15/2020  Expected End Date: 05/15/2020  This Visit's Progress: On track  Priority: High  Note:   Current Barriers:  . Limited social support . Transportation . ADL IADL limitations . Chronic conditions including DM II, CKD III, and obesity which put patient at increased risk for hospitalization   Social Work Clinical Goal(s):  [name] Over the next 90 days, patient will work with SW to address concerns related to care coordination  Interventions: . 1:1 collaboration with Glendale Chard, MD regarding development and update of comprehensive plan of care as evidenced by provider attestation and co-signature . Inter-disciplinary care team collaboration (see longitudinal plan of  care) . Inbound call received from the patient to request an update on electric wheelchair . Advised the patient SW did not receive a return call from [employer] informed patient this SW would contact [place] to obtain an update . Successful outbound call placed the [place] to request an update on status of power chair; spoke with Jackelyn Poling who reports they are awaiting insurance authorization . Requested a representative from Home contact the patient to inform of outcome . Successful outbound call placed to the patient to inform [place] is still awaiting insurance authorization . Scheduled follow up call for 2.23.22  Patient Goals/Self-Care Activities Over the next 30 days, patient will:   - Patient will self administer medications as prescribed Patient will attend all scheduled provider appointments Patient will call provider office for new concerns or questions Contact SW as needed prior to next scheduled call  Follow up Plan: Appointment scheduled for SW follow up with client by phone on: 2.23.22       Follow Up Plan: Appointment scheduled for SW follow up with client by phone on:  2.23.22      [name], BSW, CDP Social Worker, Certified Dementia Practitioner [facility] [phone]  Total time spent performing care coordination and/or care management activities with the patient by phone or face to face = 28 minutes.

## 2020-03-07 ENCOUNTER — Encounter (HOSPITAL_COMMUNITY): Payer: Medicare Other | Admitting: Physical Therapy

## 2020-03-08 ENCOUNTER — Ambulatory Visit: Payer: Medicare Other

## 2020-03-08 DIAGNOSIS — M170 Bilateral primary osteoarthritis of knee: Secondary | ICD-10-CM | POA: Diagnosis not present

## 2020-03-08 DIAGNOSIS — I129 Hypertensive chronic kidney disease with stage 1 through stage 4 chronic kidney disease, or unspecified chronic kidney disease: Secondary | ICD-10-CM | POA: Diagnosis not present

## 2020-03-08 DIAGNOSIS — E1122 Type 2 diabetes mellitus with diabetic chronic kidney disease: Secondary | ICD-10-CM | POA: Diagnosis not present

## 2020-03-08 DIAGNOSIS — L89623 Pressure ulcer of left heel, stage 3: Secondary | ICD-10-CM | POA: Diagnosis not present

## 2020-03-08 DIAGNOSIS — M6281 Muscle weakness (generalized): Secondary | ICD-10-CM | POA: Diagnosis not present

## 2020-03-08 DIAGNOSIS — I890 Lymphedema, not elsewhere classified: Secondary | ICD-10-CM | POA: Diagnosis not present

## 2020-03-08 DIAGNOSIS — N183 Chronic kidney disease, stage 3 unspecified: Secondary | ICD-10-CM | POA: Diagnosis not present

## 2020-03-08 DIAGNOSIS — R269 Unspecified abnormalities of gait and mobility: Secondary | ICD-10-CM

## 2020-03-08 DIAGNOSIS — R262 Difficulty in walking, not elsewhere classified: Secondary | ICD-10-CM | POA: Diagnosis not present

## 2020-03-08 NOTE — Patient Instructions (Signed)
   Goals we discussed today:  Goals Addressed            This Visit's Progress   . wotk with SW to manage care coordination needs       Timeframe:  Long-Range Goal Priority:  High Start Date:  2.1.22                          Expected End Date: 5.2.22                       Next planned outreach: 3.7.22  Patient Goals/Self-Care Activities Over the next 30 days, patient will:   - Patient will self administer medications as prescribed -Patient will attend all scheduled provider appointments -Patient will call provider office for new concerns or questions -Contact SW as needed prior to next scheduled call

## 2020-03-08 NOTE — Chronic Care Management (AMB) (Signed)
Chronic Care Management    Social Work Note  03/08/2020 Name: Mary [name] MRN: [id number] DOB: Mar 20, 1945  Mary [name] is a 75 y.o. year old female who is a primary care patient of Glendale Chard, MD. The CCM team was consulted to assist the patient with chronic disease management and/or care coordination needs related to: [employer] .   Engaged with patient by telephone for follow up visit in response to provider referral for social work chronic care management and care coordination services.   Consent to Services:  The patient was given information about Chronic Care Management services, agreed to services, and gave verbal consent prior to initiation of services.  Please see initial visit note for detailed documentation.   Patient agreed to services and consent obtained.   Assessment: Review of patient past medical history, allergies, medications, and health status, including review of relevant consultants reports was performed today as part of a comprehensive evaluation and provision of chronic care management and care coordination services.     SDOH (Social Determinants of Health) assessments and interventions performed:    Advanced Directives Status: Not addressed in this encounter.  CCM Care Plan  No Known Allergies  Outpatient Encounter Medications as of 03/08/2020  Medication Sig Note  . Assure Comfort Lancets 28G MISC    . Blood Glucose Monitoring Suppl (ONETOUCH VERIO FLEX SYSTEM) w/Device KIT Use as directed to check blood sugars 2 times per day dx: e11.65   . cetirizine (ZYRTEC) 10 MG tablet TAKE 1 TABLET BY MOUTH EVERY DAY   . Dexchlorpheniramine Maleate (RYCLORA) 2 MG/5ML SOLN 5cc twice daily as needed   . diphenoxylate-atropine (LOMOTIL) 2.5-0.025 MG tablet Take by mouth.   . dorzolamide-timolol (COSOPT) 22.3-6.8 MG/ML ophthalmic solution Place 1 drop into the right eye 2 (two) times daily.   . fluorometholone (FML) 0.1 % ophthalmic suspension Place 1  drop into the right eye 3 (three) times daily.   . fluticasone (FLONASE) 50 MCG/ACT nasal spray SPRAY 1 SPRAY INTO EACH NOSTRIL EVERY DAY   . furosemide (LASIX) 40 MG tablet Take 1 tablet (40 mg total) by mouth daily.   [name] glucose blood (ONETOUCH VERIO) test strip Use as directed to check blood sugars 2 times per day dx: e11.65   . HYDROcodone-acetaminophen (NORCO) 10-325 MG tablet Take 1 tablet by mouth 5 (five) times daily as needed.   [name] KLOR-CON M20 20 MEQ tablet Take 20 mEq by mouth daily. with food 02/23/2018: Patient instructed to hold this medication due to abnormal Potassium level of 6.2 on 02/18/18, per MD, Dr. Glendale Chard. Patient was given verbal education related to complications from hypo and hyperkalemia and importance of following MD recommendations. Patient instructed to resume this medication when directed by PCP. Patient verbalizes understanding.   [name] levothyroxine (SYNTHROID) 125 MCG tablet Take 125 mcg by mouth daily before breakfast.   . magnesium oxide (MAG-OX) 400 MG tablet TAKE 1 TABLET BY MOUTH EVERY DAY IN THE EVENING   . metoprolol succinate (TOPROL-XL) 50 MG 24 hr tablet Take 1 tablet (50 mg total) by mouth daily.   . metoprolol tartrate (LOPRESSOR) 50 MG tablet Take 50 mg by mouth 2 (two) times daily.    [name] omeprazole (PRILOSEC) 20 MG capsule TAKE 1 CAPSULE BY MOUTH  DAILY   . ondansetron (ZOFRAN) 4 MG tablet Take by mouth.   [name] Delica Lancets 10Y MISC Use as directed to check blood sugars 2 times per day dx: e11.65   . pravastatin (PRAVACHOL)  40 MG tablet TAKE 1 TABLET BY MOUTH EVERY DAY   . prednisoLONE acetate (PRED FORTE) 1 % ophthalmic suspension Place 1 drop into the right eye 3 (three) times daily.   . predniSONE (DELTASONE) 10 MG tablet Take by mouth.    . sulfamethoxazole-trimethoprim (BACTRIM DS) 800-160 MG tablet Take 1 tablet by mouth 2 (two) times daily.   [name] telmisartan (MICARDIS) 80 MG tablet TAKE 1 TABLET BY MOUTH  DAILY   . triamcinolone (KENALOG)  0.1 % APPLY TO AFFECTED AREA TWICE A DAY    No facility-administered encounter medications on file as of 03/08/2020.    Patient Active Problem List   Diagnosis Date Noted  . Change in bowel habit 01/20/2020  . Chronic anemia 01/20/2020  . Colon cancer screening 01/20/2020  . Diabetes mellitus with stage 3 chronic kidney disease ([place]) 02/21/2018  . 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 Primary osteoarthritis of both knees 02/21/2018  . Class 3 severe obesity due to excess calories with serious comorbidity and body mass index (BMI) of 45.0 to 49.9 in adult ([place]) 02/21/2018  . Skin ulcer of right foot with fat layer exposed ([place]) 02/18/2018  . Morbid (severe) obesity due to excess calories ([place]) 11/18/2017  . Knee pain 07/16/2015  . Lateral dislocation of right patella 05/04/2015  . Rupture of right quadriceps tendon 05/24/2014  . S/P TKR (total knee replacement) 05/05/2012    Conditions to be addressed/monitored: DMII and CKD Stage III; ADL IADL limitations  Care Plan : Social Work Care Plan  Updates made by [name] since 03/08/2020 12:00 AM    Problem: Care Coordination     Long-Range Goal: Identify resources to assist with care coordination needs   Start Date: 02/15/2020  Expected End Date: 05/15/2020  Recent Progress: On track  Priority: High  Note:   Current Barriers:  . Limited social support . Transportation . ADL IADL limitations . Chronic conditions including DM II, CKD III, and obesity which put patient at increased risk for hospitalization   Social Work Clinical Goal(s):  [name] Over the next 90 days, patient will work with SW to address concerns related to care coordination  Interventions: Completed 2.23.22 . Successful outbound call placed to the patient to assess for goal progression . Determined the patient had yet to hear from [facility] regarding power chair . Discussed SW would outreach [employer] for an update . Successful outbound call placed to  [place] to determine insurance is still pending . 1:1 collaboration with Glendale Chard, MD regarding development and update of comprehensive plan of care as evidenced by provider attestation and co-signature . Inter-disciplinary care team collaboration (see longitudinal plan of care) . Inbound call received from the patient to request an update on electric wheelchair . Advised the patient SW did not receive a return call from [place] informed patient this SW would contact [facility] to obtain an update . Successful outbound call placed the [place] to request an update on status of power chair; spoke with Jackelyn Poling who reports they are awaiting insurance authorization . Requested a representative from [facility] contact the patient to inform of outcome . Successful outbound call placed to the patient to inform [place] is still awaiting insurance authorization . Scheduled follow up call for 2.23.22  Patient Goals/Self-Care Activities Over the next 30 days, patient will:   - Patient will self administer medications as prescribed Patient will attend all scheduled provider appointments Patient will call provider office for new  concerns or questions Contact SW as needed prior to next scheduled call  Follow up Plan: SW will follow up with patient by phone over the next 14 days       Follow Up Plan: SW will follow up with patient by phone over the next 14 days      [name], BSW, CDP Social Worker, Certified Dementia Practitioner [facility] [phone]  Total time spent performing care coordination and/or care management activities with the patient by phone or face to face = 18 minutes.

## 2020-03-09 ENCOUNTER — Encounter (HOSPITAL_COMMUNITY): Payer: Medicare Other | Admitting: Physical Therapy

## 2020-03-10 ENCOUNTER — Encounter (HOSPITAL_COMMUNITY): Payer: Medicare Other | Admitting: Physical Therapy

## 2020-03-10 DIAGNOSIS — I129 Hypertensive chronic kidney disease with stage 1 through stage 4 chronic kidney disease, or unspecified chronic kidney disease: Secondary | ICD-10-CM | POA: Diagnosis not present

## 2020-03-10 DIAGNOSIS — M6281 Muscle weakness (generalized): Secondary | ICD-10-CM | POA: Diagnosis not present

## 2020-03-10 DIAGNOSIS — M170 Bilateral primary osteoarthritis of knee: Secondary | ICD-10-CM | POA: Diagnosis not present

## 2020-03-10 DIAGNOSIS — E1122 Type 2 diabetes mellitus with diabetic chronic kidney disease: Secondary | ICD-10-CM | POA: Diagnosis not present

## 2020-03-10 DIAGNOSIS — I890 Lymphedema, not elsewhere classified: Secondary | ICD-10-CM | POA: Diagnosis not present

## 2020-03-10 DIAGNOSIS — R262 Difficulty in walking, not elsewhere classified: Secondary | ICD-10-CM | POA: Diagnosis not present

## 2020-03-10 DIAGNOSIS — N183 Chronic kidney disease, stage 3 unspecified: Secondary | ICD-10-CM | POA: Diagnosis not present

## 2020-03-10 DIAGNOSIS — L89623 Pressure ulcer of left heel, stage 3: Secondary | ICD-10-CM | POA: Diagnosis not present

## 2020-03-13 ENCOUNTER — Encounter (HOSPITAL_COMMUNITY): Payer: Medicare Other | Admitting: Physical Therapy

## 2020-03-13 DIAGNOSIS — R262 Difficulty in walking, not elsewhere classified: Secondary | ICD-10-CM | POA: Diagnosis not present

## 2020-03-13 DIAGNOSIS — M6281 Muscle weakness (generalized): Secondary | ICD-10-CM | POA: Diagnosis not present

## 2020-03-13 DIAGNOSIS — L89623 Pressure ulcer of left heel, stage 3: Secondary | ICD-10-CM | POA: Diagnosis not present

## 2020-03-13 DIAGNOSIS — I890 Lymphedema, not elsewhere classified: Secondary | ICD-10-CM | POA: Diagnosis not present

## 2020-03-13 DIAGNOSIS — N183 Chronic kidney disease, stage 3 unspecified: Secondary | ICD-10-CM | POA: Diagnosis not present

## 2020-03-13 DIAGNOSIS — I129 Hypertensive chronic kidney disease with stage 1 through stage 4 chronic kidney disease, or unspecified chronic kidney disease: Secondary | ICD-10-CM | POA: Diagnosis not present

## 2020-03-13 DIAGNOSIS — M170 Bilateral primary osteoarthritis of knee: Secondary | ICD-10-CM | POA: Diagnosis not present

## 2020-03-13 DIAGNOSIS — E1122 Type 2 diabetes mellitus with diabetic chronic kidney disease: Secondary | ICD-10-CM | POA: Diagnosis not present

## 2020-03-14 DIAGNOSIS — M170 Bilateral primary osteoarthritis of knee: Secondary | ICD-10-CM | POA: Diagnosis not present

## 2020-03-14 DIAGNOSIS — R262 Difficulty in walking, not elsewhere classified: Secondary | ICD-10-CM | POA: Diagnosis not present

## 2020-03-14 DIAGNOSIS — M6281 Muscle weakness (generalized): Secondary | ICD-10-CM | POA: Diagnosis not present

## 2020-03-14 DIAGNOSIS — I129 Hypertensive chronic kidney disease with stage 1 through stage 4 chronic kidney disease, or unspecified chronic kidney disease: Secondary | ICD-10-CM | POA: Diagnosis not present

## 2020-03-14 DIAGNOSIS — E1122 Type 2 diabetes mellitus with diabetic chronic kidney disease: Secondary | ICD-10-CM | POA: Diagnosis not present

## 2020-03-14 DIAGNOSIS — L89623 Pressure ulcer of left heel, stage 3: Secondary | ICD-10-CM | POA: Diagnosis not present

## 2020-03-14 DIAGNOSIS — N183 Chronic kidney disease, stage 3 unspecified: Secondary | ICD-10-CM | POA: Diagnosis not present

## 2020-03-14 DIAGNOSIS — I890 Lymphedema, not elsewhere classified: Secondary | ICD-10-CM | POA: Diagnosis not present

## 2020-03-15 ENCOUNTER — Encounter (HOSPITAL_COMMUNITY): Payer: Medicare Other | Admitting: Physical Therapy

## 2020-03-15 DIAGNOSIS — L89623 Pressure ulcer of left heel, stage 3: Secondary | ICD-10-CM | POA: Diagnosis not present

## 2020-03-16 DIAGNOSIS — M170 Bilateral primary osteoarthritis of knee: Secondary | ICD-10-CM | POA: Diagnosis not present

## 2020-03-16 DIAGNOSIS — E1122 Type 2 diabetes mellitus with diabetic chronic kidney disease: Secondary | ICD-10-CM | POA: Diagnosis not present

## 2020-03-16 DIAGNOSIS — M6281 Muscle weakness (generalized): Secondary | ICD-10-CM | POA: Diagnosis not present

## 2020-03-16 DIAGNOSIS — I129 Hypertensive chronic kidney disease with stage 1 through stage 4 chronic kidney disease, or unspecified chronic kidney disease: Secondary | ICD-10-CM | POA: Diagnosis not present

## 2020-03-16 DIAGNOSIS — R262 Difficulty in walking, not elsewhere classified: Secondary | ICD-10-CM | POA: Diagnosis not present

## 2020-03-16 DIAGNOSIS — I890 Lymphedema, not elsewhere classified: Secondary | ICD-10-CM | POA: Diagnosis not present

## 2020-03-16 DIAGNOSIS — N183 Chronic kidney disease, stage 3 unspecified: Secondary | ICD-10-CM | POA: Diagnosis not present

## 2020-03-16 DIAGNOSIS — L89623 Pressure ulcer of left heel, stage 3: Secondary | ICD-10-CM | POA: Diagnosis not present

## 2020-03-17 ENCOUNTER — Encounter (HOSPITAL_COMMUNITY): Payer: Medicare Other

## 2020-03-20 ENCOUNTER — Telehealth: Payer: Medicare Other

## 2020-03-20 ENCOUNTER — Telehealth: Payer: Self-pay

## 2020-03-20 NOTE — Progress Notes (Signed)
[facility] Note  03/24/2020     CHIEF COMPLAINT Patient presents for Retina Follow Up   HISTORY OF PRESENT ILLNESS: Mary [name] is a 75 y.o. female who presents to the clinic today for:   HPI    Retina Follow Up    Patient presents with  CRVO/BRVO.  In both eyes.  This started years ago.  Severity is moderate.  Duration of 6 weeks.  Since onset it is stable.  I, the attending physician,  performed the HPI with the patient and updated documentation appropriately.          Comments    75 y/o female pt here for 6 wk f/u for CRVO w/CME OU.  No change in [place] OU.  Denies pain, FOL, floaters.  Timolol BID OD.  BS 90 this a.m.  A1C 4.9 3 wks ago.       Last edited by Bernarda Caffey, MD on 03/24/2020  9:47 AM. (History)    pt    Referring physician: Glendale Chard, [address] [place],  [place] [address]  HISTORICAL INFORMATION:   Selected notes from the [place] Transferred care from Dr. Zigmund Daniel   CURRENT MEDICATIONS: Current Outpatient Medications (Ophthalmic Drugs)  Medication Sig  . dorzolamide-timolol (COSOPT) 22.3-6.8 MG/ML ophthalmic solution Place 1 drop into the right eye 2 (two) times daily. (Patient not taking: Reported on 03/24/2020)  . fluorometholone (FML) 0.1 % ophthalmic suspension Place 1 drop into the right eye 3 (three) times daily. (Patient not taking: Reported on 03/24/2020)  . prednisoLONE acetate (PRED FORTE) 1 % ophthalmic suspension Place 1 drop into the right eye 3 (three) times daily. (Patient not taking: Reported on 03/24/2020)   No current facility-administered medications for this visit. (Ophthalmic Drugs)   Current Outpatient Medications (Other)  Medication Sig  . aspirin 81 MG chewable tablet 1 tablet  . Assure Comfort Lancets 28G MISC   . Blood Glucose Monitoring Suppl (ONETOUCH VERIO FLEX SYSTEM) w/Device KIT Use as directed to check blood sugars 2 times per day dx: e11.65  .  cetirizine (ZYRTEC) 10 MG tablet TAKE 1 TABLET BY MOUTH EVERY DAY  . Cyanocobalamin (VITAMIN B 12) 500 MCG TABS 1 tablet  . Dexchlorpheniramine Maleate (RYCLORA) 2 MG/5ML SOLN 5cc twice daily as needed  . diphenoxylate-atropine (LOMOTIL) 2.5-0.025 MG tablet Take by mouth.  . fluticasone (FLONASE) 50 MCG/ACT nasal spray SPRAY 1 SPRAY INTO EACH NOSTRIL EVERY DAY  . furosemide (LASIX) 40 MG tablet Take 1 tablet (40 mg total) by mouth daily.  [name] glucose blood (ONETOUCH VERIO) test strip Use as directed to check blood sugars 2 times per day dx: e11.65  . HYDROcodone-acetaminophen (NORCO) 10-325 MG tablet Take 1 tablet by mouth 5 (five) times daily as needed.  [name] KLOR-CON M20 20 MEQ tablet Take 20 mEq by mouth daily. with food  . levothyroxine (SYNTHROID) 125 MCG tablet Take 125 mcg by mouth daily before breakfast.  . magnesium oxide (MAG-OX) 400 MG tablet TAKE 1 TABLET BY MOUTH EVERY DAY IN THE EVENING  . metoprolol succinate (TOPROL-XL) 50 MG 24 hr tablet Take 1 tablet (50 mg total) by mouth daily.  . metoprolol tartrate (LOPRESSOR) 50 MG tablet Take 50 mg by mouth 2 (two) times daily.   [name] omeprazole (PRILOSEC) 20 MG capsule TAKE 1 CAPSULE BY MOUTH  DAILY  . ondansetron (ZOFRAN) 4 MG tablet Take by mouth.  [name] Delica Lancets 25W MISC Use as directed to  check blood sugars 2 times per day dx: e11.65  . oxyCODONE-acetaminophen (PERCOCET) 7.5-325 MG tablet 1 tablet as needed  . pravastatin (PRAVACHOL) 40 MG tablet TAKE 1 TABLET BY MOUTH EVERY DAY  . predniSONE (DELTASONE) 10 MG tablet Take by mouth.   . sulfamethoxazole-trimethoprim (BACTRIM DS) 800-160 MG tablet Take 1 tablet by mouth 2 (two) times daily.  [name] telmisartan (MICARDIS) 80 MG tablet TAKE 1 TABLET BY MOUTH  DAILY  . telmisartan-hydrochlorothiazide (MICARDIS HCT) 80-25 MG tablet 1 tablet  . triamcinolone (KENALOG) 0.1 % APPLY TO AFFECTED AREA TWICE A DAY   No current facility-administered medications for this visit. (Other)       REVIEW OF SYSTEMS: ROS    Positive for: Musculoskeletal, Endocrine, Eyes   Negative for: Constitutional, Gastrointestinal, Neurological, Skin, Genitourinary, HENT, Cardiovascular, Respiratory, Psychiatric, Allergic/Imm, Heme/Lymph   Last edited by Matthew Folks, COA on 03/24/2020  9:23 AM. (History)       ALLERGIES No Known Allergies  PAST MEDICAL HISTORY Past Medical History:  Diagnosis Date  . Arthritis    osteoarthritis  . Cataract    OS  . Diabetes mellitus    Insulin dependent  . Diabetic retinopathy ([place])    NPDR OU  . Glaucoma    POAG OD  . H/O cardiovascular stress test    pt. reports 10/13- was normal per pt.   . Hyperlipemia   . Hypertension   . Hypertensive retinopathy    OU   Past Surgical History:  Procedure Laterality Date  . CARPAL TUNNEL RELEASE Bilateral   . CATARACT EXTRACTION Right 10/05/2018   Dr. Kathlen Mody  . EYE SURGERY Right    Cat Sx  . KNEE ARTHROSCOPY     right  . QUADRICEPS TENDON REPAIR  02/17/2012   Procedure: REPAIR QUADRICEP TENDON;  Surgeon: Vickey Huger, MD;  Location: [facility];  Service: Orthopedics;  Laterality: Right;  right quadriceps repair with latera release  . QUADRICEPS TENDON REPAIR Right 05/15/2012   Procedure: REPAIR QUADRICEP TENDON;  Surgeon: Vickey Huger, MD;  Location: [facility];  Service: Orthopedics;  Laterality: Right;  . TOTAL KNEE ARTHROPLASTY  10/21/2011   rt tk  . TOTAL KNEE ARTHROPLASTY  10/21/2011   Procedure: TOTAL KNEE ARTHROPLASTY;  Surgeon: Rudean Haskell, MD;  Location: [facility];  Service: Orthopedics;  Laterality: Right;  . TUBAL LIGATION      FAMILY HISTORY Family History  Problem Relation Age of Onset  . Hypertension Mother   . Heart Problems Father   . Gout Father   . Arthritis Father     SOCIAL HISTORY Social History   Tobacco Use  . Smoking status: Former Smoker    Packs/day: 0.25    Years: 20.00    Pack years: 5.00    Types: Cigarettes    Quit date: 06/28/2014    Years since quitting:  5.7  . Smokeless tobacco: Never Used  Vaping Use  . Vaping Use: Never used  Substance Use Topics  . Alcohol use: No  . Drug use: Yes    Types: Hydrocodone         OPHTHALMIC EXAM:  Base Eye Exam    Visual Acuity (Snellen - Linear)      Right Left   Dist [place] 20/60 CF @ face   Dist ph [place] 20/50 -2 NI       Tonometry (Tonopen, 9:26 AM)      Right Left   Pressure 14 14       Pupils  Dark Light Shape React APD   Right 3 2 Round Brisk None   Left 3 2 Round Brisk None       Visual Fields (Counting fingers)      Left Right     Full   Restrictions Total superior temporal, inferior temporal, inferior nasal deficiencies; Partial outer superior nasal deficiency        Extraocular Movement      Right Left    Full Full  LXT       Neuro/Psych    Oriented x3: Yes   Mood/Affect: Normal       Dilation    Both eyes: 1.0% Mydriacyl, 2.5% Phenylephrine @ 9:26 AM        Slit Lamp and Fundus Exam    Slit Lamp Exam      Right Left   Lids/Lashes Dermatochalasis - upper lid, mild Meibomian gland dysfunction Dermatochalasis - upper lid, mild Meibomian gland dysfunction   Conjunctiva/Sclera Melanosis Melanosis   Cornea 2-3+ Punctate epithelial erosions, well healed temporal cataract wounds 3+ Punctate epithelial erosions, well healed temporal cataract wounds   Anterior Chamber Deep and quiet Deep and quiet   Iris Round and moderately dilated to 5.57m Round and moderately dilated to 63m  Lens PC IOL in good position, trace, cortical remnant temporally, PC folds 2-3+ Nuclear sclerosis, 2-3+ Cortical cataract   Vitreous Vitreous syneresis, vitreous condensations, Ozurdex pellet remnants inferiorly, no residual IVT Vitreous syneresis       Fundus Exam      Right Left   Disc  2-3+Pallor, Sharp rim, temporal PPP Mild Pallor, Sharp rim, PPP, severally attenuated vessels   C/D Ratio 0.5 0.3   Macula Blunted foveal reflex, Drusen, Retinal pigment epithelial mottling, temporal  edema with focal exudates-- slightly improved Flat, Blunted foveal reflex, RPE mottling and clumping, scarring, atrophy, no heme   Vessels attenuated, Tortuous Severe Vascular attenuation, +fibrosis   Periphery Attached, no heme    Attached, pavingstone and reticular degeneration inferiorly, scattered RPE atrophy, greatest peripapillary            IMAGING AND PROCEDURES  Imaging and Procedures for @TODAY @  OCT, Retina - OU - Both Eyes       Right Eye Quality was good. Central Foveal Thickness: 209. Progression has improved. Findings include abnormal foveal contour, intraretinal fluid, intraretinal hyper-reflective material, no SRF, vitreomacular adhesion  (Mild interval improvement in IRF).   Left Eye Quality was good. Central Foveal Thickness: 234. Progression has been stable. Findings include abnormal foveal contour, no IRF, no SRF, outer retinal atrophy, epiretinal membrane, preretinal fibrosis, subretinal hyper-reflective material, pigment epithelial detachment (Stable from prior).   Notes *Images captured and stored on drive  Diagnosis / Impression:  OD: CRVO w/ Mild interval improvement in IRF OS: diffuse ORA - Stable from prior  Clinical management:  See below  Abbreviations: NFP - Normal foveal profile. CME - cystoid macular edema. PED - pigment epithelial detachment. IRF - intraretinal fluid. SRF - subretinal fluid. EZ - ellipsoid zone. ERM - epiretinal membrane. ORA - outer retinal atrophy. ORT - outer retinal tubulation. SRHM - subretinal hyper-reflective material        Intravitreal Injection, Pharmacologic Agent - OD - Right Eye       Time Out 03/24/2020. 9:45 AM. Confirmed correct patient, procedure, site, and patient consented.   Anesthesia Topical anesthesia was used. Anesthetic medications included Lidocaine 2%, Proparacaine 0.5%.   Procedure Preparation included 5% betadine to ocular surface, eyelid speculum. A 27 gauge  needle was used.   Injection:   4 mg Triescence 18m/mlL injection   NDC: [phone] Lot: [id number] Expiration date: 03/13/2022   Route: Intravitreal, Site: Right Eye, Waste: 0.9 mL  Post-op Post injection exam found visual acuity of at least counting fingers. The patient tolerated the procedure well. There were no complications. The patient received written and verbal post procedure care education. Post injection medications were not given.   Notes An AC tap was performed following injection due to elevated IOP using a 30 gauge needle on a syringe with the plunger removed. The needle was placed at the limbus at 7 oclock and approximately 0.06 cc of aqueous was removed from the anterior chamber. Betadine was applied to the tap area before and after the paracentesis was performed. There were no complications. The patient tolerated the procedure well. The IOP was rechecked and was found to be ~8 mmHg by palpation.                 ASSESSMENT/PLAN:    ICD-10-CM   1. Central retinal vein occlusion with macular edema of both eyes  H34.8130 Intravitreal Injection, Pharmacologic Agent - OD - Right Eye    triamcinolone acetonide (TRIESENCE) 40 MG/ML subtenons injection 4 mg    CANCELED: Injection into Tenon's Capsule - OD - Right Eye  2. Moderate nonproliferative diabetic retinopathy of both eyes with macular edema associated with type 2 diabetes mellitus [place]  EG01.7494  3. Retinal edema  H35.81 OCT, Retina - OU - Both Eyes  4. Essential hypertension  I10   5. Hypertensive retinopathy of both eyes  H35.033   6. Combined forms of age-related cataract of left eye  H25.812   7. Pseudophakia  Z96.1   8. Primary open angle glaucoma (POAG) of right eye, mild stage  H40.1111    1. CRVO w/ CME OU  - pt of Dr. MZigmund Danielwho wished to transfer care  - OS long-standing low vision (HM) with subfoveal scar and central retinal atrophy  - OD with CME actively being managed with Ozurdex OD ~q5 wks by Dr. MZigmund Daniel(last on 8.20.20) s/p  multiple IVTA and Ozurdex OD  - was due for Ozurdex OD w/ MZigmund Danielon 9.25.20, but underwent cataract surgery OD w/ WKathlen Modyon 9.21.20  - s/p IVTA OD #4 (10.26.20), #5 (12.10.20), #6 (01.21.21), #7 (03.05.21), #8 (04.16.21), #9 (05.28.21), #10 (07.09.21), #11 (09.14.21), #12 (10.29.21), #13 (12.17.21), #14 (01.27.22)  - BCVA stable at 20/50   - exam shows no residual IVTA in vitreous cavity  - OCT shows interval decrease in IRF ST macula and fovea at 6 wks  - recommend IVTA OD #15 today, 03.11.22  - pt wishes to proceed  - RBA of procedure discussed, questions answered  - informed consent obtained  - see procedure note -- AC tap  - f/u in 6 wks -- DFE/OCT/possible injection  2,3. Severe Non-proliferative diabetic retinopathy OU.  - OCT shows diabetic macular edema, OD   - recommend IVTA OD today, 03.11.22 as above for CRVO  4,5. Hypertensive retinopathy OU  - discussed importance of tight BP control  - monitor  6. Mixed form age related cataract OS  - under the expert management of Dr. WKathlen Mody 7. Pseudophakia OD  - s/p CE/IOL OD (9.21.20) by expert surgeon, Dr. WKathlen Mody - IOL in excellent position  - had post op IOP spike -- IOP now under better control  - monitor  8. POAG OD  - under the expert management  of Dr. Kathlen Mody  - s/p SLT and goniotomy OD  - had post-cataract IOP spike  - IOP 14 today  - AC tap post IVTA injxn  Ophthalmic Meds Ordered this visit:  Meds ordered this encounter  Medications  . triamcinolone acetonide (TRIESENCE) 40 MG/ML subtenons injection 4 mg       Return in about 6 weeks (around 05/05/2020) for f/u CRVO OU, DFE, OCT.  There are no Patient Instructions on file for this visit.   Explained the diagnoses, plan, and follow up with the patient and they expressed understanding.  Patient expressed understanding of the importance of proper follow up care.   This document serves as a record of services personally performed by Gardiner Sleeper, MD, PhD.  It was created on their behalf by San Jetty. Owens Shark, OA an ophthalmic technician. The creation of this record is the provider's dictation and/or activities during the visit.    Electronically signed by: San Jetty. Owens Shark, [place] 03.07.2022 12:14 PM  Gardiner Sleeper, M.D., Ph.D. Diseases & Surgery of the Retina and Vitreous [facility]  I have reviewed the above documentation for accuracy and completeness, and I agree with the above. Gardiner Sleeper, M.D., Ph.D. 03/24/20 12:14 PM   Abbreviations: M myopia (nearsighted); A astigmatism; H hyperopia (farsighted); P presbyopia; Mrx spectacle prescription;  CTL contact lenses; OD right eye; OS left eye; OU both eyes  XT exotropia; ET esotropia; PEK punctate epithelial keratitis; PEE punctate epithelial erosions; DES dry eye syndrome; MGD meibomian gland dysfunction; ATs artificial tears; PFAT's preservative free artificial tears; [place] nuclear sclerotic cataract; PSC posterior subcapsular cataract; ERM epi-retinal membrane; PVD posterior vitreous detachment; RD retinal detachment; DM diabetes mellitus; DR diabetic retinopathy; NPDR non-proliferative diabetic retinopathy; PDR proliferative diabetic retinopathy; CSME clinically significant macular edema; DME diabetic macular edema; dbh dot blot hemorrhages; CWS cotton wool spot; POAG primary open angle glaucoma; C/D cup-to-disc ratio; HVF humphrey visual field; GVF goldmann visual field; OCT optical coherence tomography; IOP intraocular pressure; BRVO Branch retinal vein occlusion; CRVO central retinal vein occlusion; CRAO central retinal artery occlusion; BRAO branch retinal artery occlusion; RT retinal tear; SB scleral buckle; PPV pars plana vitrectomy; VH Vitreous hemorrhage; PRP panretinal laser photocoagulation; IVK intravitreal kenalog; VMT vitreomacular traction; MH Macular hole;  NVD neovascularization of the disc; NVE neovascularization elsewhere; AREDS age related eye disease study; ARMD  age related macular degeneration; POAG primary open angle glaucoma; EBMD epithelial/anterior basement membrane dystrophy; ACIOL anterior chamber intraocular lens; IOL intraocular lens; PCIOL posterior chamber intraocular lens; Phaco/IOL phacoemulsification with intraocular lens placement; [place] photorefractive keratectomy; LASIK laser assisted in situ keratomileusis; HTN hypertension; DM diabetes mellitus; COPD chronic obstructive pulmonary disease

## 2020-03-20 NOTE — Telephone Encounter (Signed)
  Chronic Care Management   Outreach Note  03/20/2020 Name: Mary [name] MRN: [id number] DOB: 1945/04/10  Referred by: Dorothyann Peng, MD Reason for referral : Chronic Care Management   An unsuccessful telephone outreach was attempted today. The patient was referred to the case management team for assistance with care management and care coordination.   Follow Up Plan: A HIPAA compliant phone message was left for the patient providing contact information and requesting a return call.  The care management team will reach out to the patient again over the next 14 days.   Bevelyn Ngo, BSW, CDP Social Worker, Certified Dementia Practitioner [facility] Care Management [phone]

## 2020-03-21 DIAGNOSIS — I129 Hypertensive chronic kidney disease with stage 1 through stage 4 chronic kidney disease, or unspecified chronic kidney disease: Secondary | ICD-10-CM | POA: Diagnosis not present

## 2020-03-21 DIAGNOSIS — N183 Chronic kidney disease, stage 3 unspecified: Secondary | ICD-10-CM | POA: Diagnosis not present

## 2020-03-21 DIAGNOSIS — E1122 Type 2 diabetes mellitus with diabetic chronic kidney disease: Secondary | ICD-10-CM | POA: Diagnosis not present

## 2020-03-21 DIAGNOSIS — M6281 Muscle weakness (generalized): Secondary | ICD-10-CM | POA: Diagnosis not present

## 2020-03-21 DIAGNOSIS — I890 Lymphedema, not elsewhere classified: Secondary | ICD-10-CM | POA: Diagnosis not present

## 2020-03-21 DIAGNOSIS — L89623 Pressure ulcer of left heel, stage 3: Secondary | ICD-10-CM | POA: Diagnosis not present

## 2020-03-21 DIAGNOSIS — R262 Difficulty in walking, not elsewhere classified: Secondary | ICD-10-CM | POA: Diagnosis not present

## 2020-03-21 DIAGNOSIS — M170 Bilateral primary osteoarthritis of knee: Secondary | ICD-10-CM | POA: Diagnosis not present

## 2020-03-22 DIAGNOSIS — I129 Hypertensive chronic kidney disease with stage 1 through stage 4 chronic kidney disease, or unspecified chronic kidney disease: Secondary | ICD-10-CM | POA: Diagnosis not present

## 2020-03-22 DIAGNOSIS — M6281 Muscle weakness (generalized): Secondary | ICD-10-CM | POA: Diagnosis not present

## 2020-03-22 DIAGNOSIS — M170 Bilateral primary osteoarthritis of knee: Secondary | ICD-10-CM | POA: Diagnosis not present

## 2020-03-22 DIAGNOSIS — R262 Difficulty in walking, not elsewhere classified: Secondary | ICD-10-CM | POA: Diagnosis not present

## 2020-03-22 DIAGNOSIS — I890 Lymphedema, not elsewhere classified: Secondary | ICD-10-CM | POA: Diagnosis not present

## 2020-03-22 DIAGNOSIS — L89623 Pressure ulcer of left heel, stage 3: Secondary | ICD-10-CM | POA: Diagnosis not present

## 2020-03-22 DIAGNOSIS — E1122 Type 2 diabetes mellitus with diabetic chronic kidney disease: Secondary | ICD-10-CM | POA: Diagnosis not present

## 2020-03-22 DIAGNOSIS — N183 Chronic kidney disease, stage 3 unspecified: Secondary | ICD-10-CM | POA: Diagnosis not present

## 2020-03-23 DIAGNOSIS — N183 Chronic kidney disease, stage 3 unspecified: Secondary | ICD-10-CM | POA: Diagnosis not present

## 2020-03-23 DIAGNOSIS — I890 Lymphedema, not elsewhere classified: Secondary | ICD-10-CM | POA: Diagnosis not present

## 2020-03-23 DIAGNOSIS — I129 Hypertensive chronic kidney disease with stage 1 through stage 4 chronic kidney disease, or unspecified chronic kidney disease: Secondary | ICD-10-CM | POA: Diagnosis not present

## 2020-03-23 DIAGNOSIS — E1122 Type 2 diabetes mellitus with diabetic chronic kidney disease: Secondary | ICD-10-CM | POA: Diagnosis not present

## 2020-03-23 DIAGNOSIS — L89623 Pressure ulcer of left heel, stage 3: Secondary | ICD-10-CM | POA: Diagnosis not present

## 2020-03-23 DIAGNOSIS — M6281 Muscle weakness (generalized): Secondary | ICD-10-CM | POA: Diagnosis not present

## 2020-03-23 DIAGNOSIS — R262 Difficulty in walking, not elsewhere classified: Secondary | ICD-10-CM | POA: Diagnosis not present

## 2020-03-23 DIAGNOSIS — M170 Bilateral primary osteoarthritis of knee: Secondary | ICD-10-CM | POA: Diagnosis not present

## 2020-03-24 ENCOUNTER — Encounter (INDEPENDENT_AMBULATORY_CARE_PROVIDER_SITE_OTHER): Payer: Self-pay | Admitting: Ophthalmology

## 2020-03-24 ENCOUNTER — Other Ambulatory Visit: Payer: Self-pay

## 2020-03-24 ENCOUNTER — Ambulatory Visit (INDEPENDENT_AMBULATORY_CARE_PROVIDER_SITE_OTHER): Payer: Medicare Other | Admitting: Ophthalmology

## 2020-03-24 DIAGNOSIS — I10 Essential (primary) hypertension: Secondary | ICD-10-CM | POA: Diagnosis not present

## 2020-03-24 DIAGNOSIS — H34813 Central retinal vein occlusion, bilateral, with macular edema: Secondary | ICD-10-CM

## 2020-03-24 DIAGNOSIS — E113313 Type 2 diabetes mellitus with moderate nonproliferative diabetic retinopathy with macular edema, bilateral: Secondary | ICD-10-CM | POA: Diagnosis not present

## 2020-03-24 DIAGNOSIS — Z961 Presence of intraocular lens: Secondary | ICD-10-CM

## 2020-03-24 DIAGNOSIS — H3581 Retinal edema: Secondary | ICD-10-CM

## 2020-03-24 DIAGNOSIS — H401111 Primary open-angle glaucoma, right eye, mild stage: Secondary | ICD-10-CM

## 2020-03-24 DIAGNOSIS — H25812 Combined forms of age-related cataract, left eye: Secondary | ICD-10-CM

## 2020-03-24 DIAGNOSIS — H35033 Hypertensive retinopathy, bilateral: Secondary | ICD-10-CM

## 2020-03-24 MED ORDER — TRIAMCINOLONE ACETONIDE 40 MG/ML IO SUSP
4.0000 mg | INTRAOCULAR | Status: AC | PRN
  Administered 2020-03-24: 4 mg via INTRAVITREAL

## 2020-03-25 ENCOUNTER — Other Ambulatory Visit: Payer: Self-pay | Admitting: Internal Medicine

## 2020-03-27 ENCOUNTER — Other Ambulatory Visit: Payer: Self-pay

## 2020-03-27 ENCOUNTER — Encounter: Payer: Self-pay | Admitting: Internal Medicine

## 2020-03-27 ENCOUNTER — Ambulatory Visit (INDEPENDENT_AMBULATORY_CARE_PROVIDER_SITE_OTHER): Payer: Medicare Other | Admitting: Internal Medicine

## 2020-03-27 VITALS — BP 124/76 | HR 76 | Temp 97.9°F | Ht 67.0 in

## 2020-03-27 DIAGNOSIS — Z87891 Personal history of nicotine dependence: Secondary | ICD-10-CM | POA: Diagnosis not present

## 2020-03-27 DIAGNOSIS — M6281 Muscle weakness (generalized): Secondary | ICD-10-CM | POA: Diagnosis not present

## 2020-03-27 DIAGNOSIS — N183 Chronic kidney disease, stage 3 unspecified: Secondary | ICD-10-CM | POA: Diagnosis not present

## 2020-03-27 DIAGNOSIS — I129 Hypertensive chronic kidney disease with stage 1 through stage 4 chronic kidney disease, or unspecified chronic kidney disease: Secondary | ICD-10-CM | POA: Diagnosis not present

## 2020-03-27 DIAGNOSIS — E1122 Type 2 diabetes mellitus with diabetic chronic kidney disease: Secondary | ICD-10-CM

## 2020-03-27 DIAGNOSIS — I890 Lymphedema, not elsewhere classified: Secondary | ICD-10-CM | POA: Diagnosis not present

## 2020-03-27 DIAGNOSIS — E7800 Pure hypercholesterolemia, unspecified: Secondary | ICD-10-CM

## 2020-03-27 DIAGNOSIS — M170 Bilateral primary osteoarthritis of knee: Secondary | ICD-10-CM | POA: Diagnosis not present

## 2020-03-27 DIAGNOSIS — R262 Difficulty in walking, not elsewhere classified: Secondary | ICD-10-CM | POA: Diagnosis not present

## 2020-03-27 DIAGNOSIS — N1831 Chronic kidney disease, stage 3a: Secondary | ICD-10-CM | POA: Diagnosis not present

## 2020-03-27 DIAGNOSIS — E890 Postprocedural hypothyroidism: Secondary | ICD-10-CM | POA: Diagnosis not present

## 2020-03-27 DIAGNOSIS — L89623 Pressure ulcer of left heel, stage 3: Secondary | ICD-10-CM | POA: Diagnosis not present

## 2020-03-27 DIAGNOSIS — Z6841 Body Mass Index (BMI) 40.0 and over, adult: Secondary | ICD-10-CM

## 2020-03-27 DIAGNOSIS — E669 Obesity, unspecified: Secondary | ICD-10-CM

## 2020-03-27 NOTE — Progress Notes (Signed)
I,Katawbba Wiggins,acting as a Education administrator for Maximino Greenland, MD.,have documented all relevant documentation on the behalf of Maximino Greenland, MD,as directed by  Maximino Greenland, MD while in the presence of Maximino Greenland, MD.  This visit occurred during the SARS-CoV-2 public health emergency.  Safety protocols were in place, including screening questions prior to the visit, additional usage of staff PPE, and extensive cleaning of exam room while observing appropriate contact time as indicated for disinfecting solutions.  Subjective:     Patient ID: Mary [name] , female    DOB: Jun 09, 1945 , 75 y.o.   MRN: [id number]   Chief Complaint  Patient presents with  . Diabetes  . Hyperlipidemia    HPI  The patient is here today for a diabetes and hypertension follow-up.    Diabetes She presents for her follow-up diabetic visit. She has type 2 diabetes mellitus. Her disease course has been stable. There are no hypoglycemic associated symptoms. Pertinent negatives for hypoglycemia include no headaches. Associated symptoms include fatigue. Pertinent negatives for diabetes include no blurred vision and no chest pain. There are no hypoglycemic complications. Diabetic complications include nephropathy. She is following a diabetic diet.  Hyperlipidemia Pertinent negatives include no chest pain or shortness of breath.  Hypertension This is a chronic problem. The current episode started more than 1 year ago. Pertinent negatives include no blurred vision, chest pain, headaches, orthopnea, palpitations or shortness of breath. Risk factors for coronary artery disease include diabetes mellitus, dyslipidemia, post-menopausal state and sedentary lifestyle.     Past Medical History:  Diagnosis Date  . Arthritis    osteoarthritis  . Cataract    OS  . Diabetes mellitus    Insulin dependent  . Diabetic retinopathy ([place])    NPDR OU  . Glaucoma    POAG OD  . H/O cardiovascular stress test    pt. reports  10/13- was normal per pt.   . Hyperlipemia   . Hypertension   . Hypertensive retinopathy    OU     Family History  Problem Relation Age of Onset  . Hypertension Mother   . Heart Problems Father   . Gout Father   . Arthritis Father      Current Outpatient Medications:  .  aspirin 81 MG chewable tablet, 1 tablet, Disp: , Rfl:  .  Assure Comfort Lancets 28G MISC, , Disp: , Rfl:  .  Blood Glucose Monitoring Suppl (ONETOUCH VERIO FLEX SYSTEM) w/Device KIT, Use as directed to check blood sugars 2 times per day dx: e11.65, Disp: 1 kit, Rfl: 1 .  cetirizine (ZYRTEC) 10 MG tablet, TAKE 1 TABLET BY MOUTH EVERY DAY, Disp: 90 tablet, Rfl: 1 .  Cyanocobalamin (VITAMIN B 12) 500 MCG TABS, 1 tablet, Disp: , Rfl:  .  Dexchlorpheniramine Maleate (RYCLORA) 2 MG/5ML SOLN, 5cc twice daily as needed, Disp: 118 mL, Rfl: 0 .  diphenoxylate-atropine (LOMOTIL) 2.5-0.025 MG tablet, Take by mouth., Disp: , Rfl:  .  dorzolamide-timolol (COSOPT) 22.3-6.8 MG/ML ophthalmic solution, Place 1 drop into the right eye 2 (two) times daily., Disp: , Rfl:  .  fluticasone (FLONASE) 50 MCG/ACT nasal spray, SPRAY 1 SPRAY INTO EACH NOSTRIL EVERY DAY, Disp: 32 mL, Rfl: 1 .  furosemide (LASIX) 40 MG tablet, Take 1 tablet (40 mg total) by mouth daily., Disp: 90 tablet, Rfl: 2 .  glucose blood (ONETOUCH VERIO) test strip, Use as directed to check blood sugars 2 times per day dx: e11.65, Disp: 100 each, Rfl: 3 .  HYDROcodone-acetaminophen (NORCO) 10-325 MG tablet, Take 1 tablet by mouth 5 (five) times daily as needed., Disp: , Rfl:  .  KLOR-CON M20 20 MEQ tablet, Take 20 mEq by mouth daily. with food, Disp: , Rfl: 1 .  levothyroxine (SYNTHROID) 125 MCG tablet, Take 125 mcg by mouth daily before breakfast., Disp: , Rfl:  .  magnesium oxide (MAG-OX) 400 MG tablet, TAKE 1 TABLET BY MOUTH EVERY DAY IN THE EVENING, Disp: 90 tablet, Rfl: 1 .  metoprolol succinate (TOPROL-XL) 50 MG 24 hr tablet, Take 1 tablet (50 mg total) by mouth  daily., Disp: 90 tablet, Rfl: 1 .  omeprazole (PRILOSEC) 20 MG capsule, TAKE 1 CAPSULE BY MOUTH  DAILY, Disp: 90 capsule, Rfl: 3 .  ondansetron (ZOFRAN) 4 MG tablet, Take by mouth., Disp: , Rfl:  .  OneTouch Delica Lancets 23N MISC, Use as directed to check blood sugars 2 times per day dx: e11.65, Disp: 100 each, Rfl: 3 .  oxyCODONE-acetaminophen (PERCOCET) 7.5-325 MG tablet, 1 tablet as needed, Disp: , Rfl:  .  pravastatin (PRAVACHOL) 40 MG tablet, TAKE 1 TABLET BY MOUTH EVERY DAY, Disp: 90 tablet, Rfl: 1 .  predniSONE (DELTASONE) 10 MG tablet, Take by mouth. , Disp: , Rfl:  .  sulfamethoxazole-trimethoprim (BACTRIM DS) 800-160 MG tablet, Take 1 tablet by mouth 2 (two) times daily., Disp: 28 tablet, Rfl: 0 .  telmisartan (MICARDIS) 80 MG tablet, TAKE 1 TABLET BY MOUTH  DAILY, Disp: 90 tablet, Rfl: 3 .  triamcinolone (KENALOG) 0.1 %, APPLY TO AFFECTED AREA TWICE A DAY, Disp: 30 g, Rfl: 0   No Known Allergies   Review of Systems  Constitutional: Positive for fatigue.  Eyes: Negative for blurred vision.  Respiratory: Negative.  Negative for shortness of breath.   Cardiovascular: Negative.  Negative for chest pain, palpitations and orthopnea.  Gastrointestinal: Negative.   Neurological: Negative for headaches.  Psychiatric/Behavioral: Negative.   All other systems reviewed and are negative.    Today's Vitals   03/27/20 1110  BP: 124/76  Pulse: 76  Temp: 97.9 F (36.6 C)  TempSrc: Oral  Height: 5' 7"  (1.702 m)  PainSc: 9   PainLoc: Shoulder   Body mass index is 46.99 kg/m.  Wt Readings from Last 3 Encounters:  09/02/19 300 lb (136.1 kg)  06/28/19 290 lb (131.5 kg)  04/06/19 293 lb 12.8 oz (133.3 kg)   Objective:  Physical Exam Vitals and nursing note reviewed.  Constitutional:      Appearance: Normal appearance. She is obese.     Comments: Seated in wheelchair  HENT:     Head: Normocephalic and atraumatic.     Nose:     Comments: Masked     Mouth/Throat:     Comments:  Masked  Eyes:     General: Lids are normal.     Comments: strabismus  Cardiovascular:     Rate and Rhythm: Normal rate and regular rhythm.     Heart sounds: Normal heart sounds.  Pulmonary:     Breath sounds: Normal breath sounds.     Comments: B/l clavicular bony abnormality, L>R Chest:     Chest wall: Swelling present.     Comments: She has left clavicular swelling, nontender. No supra-clavicular adenopathy.  Musculoskeletal:        General: Swelling present.     Cervical back: Normal range of motion.     Right lower leg: Edema present.     Left lower leg: Edema present.  Skin:  General: Skin is warm.  Neurological:     General: No focal deficit present.     Mental Status: She is alert and oriented to person, place, and time.         Assessment And Plan:     1. Diabetes mellitus with stage 3 chronic kidney disease ([place]) Comments: I will check labs as listed below. Encouraged to follow dietary recommendations.  - Hemoglobin A1c - Lipid panel - CMP14+EGFR  2. Pure hypercholesterolemia Comments: She is encouraged to avoid fried foods and increase daily activity.  - Lipid panel  3. Parenchymal renal hypertension, stage 1 through stage 4 or unspecified chronic kidney disease Comments: Chronic, well controlled. She is encouraged to follow low sodium diet.  - CMP14+EGFR  4. Stage 3a chronic kidney disease ([place]) Comments: I will check Renal labs at her next visit.  5. Postablative hypothyroidism Comments: I will check thyroid panel and adjust meds as needed.  - TSH - T4, free  6. Class 3 severe obesity due to excess calories with serious comorbidity and body mass index (BMI) of 45.0 to 49.9 in adult [facility]) Comments: She is encouraged to perform chair exercises daily while watching TV.   7. History of tobacco use Comments: She declined low dose CT chest lung cancer screening.   8. Lymphedema associated with obesity Comments: Unfortunately, she is unable to go to  [place] clinic. Will try to find [place] location that provides lymphatic massage/PT.  Patient was given opportunity to ask questions. Patient verbalized understanding of the plan and was able to repeat key elements of the plan. All questions were answered to their satisfaction.   I, Maximino Greenland, MD, have reviewed all documentation for this visit. The documentation on 03/27/20 for the exam, diagnosis, procedures, and orders are all accurate and complete.   IF YOU HAVE BEEN REFERRED TO A SPECIALIST, IT MAY TAKE 1-2 WEEKS TO SCHEDULE/PROCESS THE REFERRAL. IF YOU HAVE NOT HEARD FROM US/SPECIALIST IN TWO WEEKS, PLEASE GIVE [place] A CALL AT [phone].   THE PATIENT IS ENCOURAGED TO PRACTICE SOCIAL DISTANCING DUE TO THE COVID-19 PANDEMIC.

## 2020-03-27 NOTE — Patient Instructions (Signed)
Diabetes Mellitus and Foot Care Foot care is an important part of your health, especially when you have diabetes. Diabetes may cause you to have problems because of poor blood flow (circulation) to your feet and legs, which can cause your skin to:  Become thinner and drier.  Break more easily.  Heal more slowly.  Peel and crack. You may also have nerve damage (neuropathy) in your legs and feet, causing decreased feeling in them. This means that you may not notice minor injuries to your feet that could lead to more serious problems. Noticing and addressing any potential problems early is the best way to prevent future foot problems. How to care for your feet Foot hygiene  Wash your feet daily with warm water and mild soap. Do not use hot water. Then, pat your feet and the areas between your toes until they are completely dry. Do not soak your feet as this can dry your skin.  Trim your toenails straight across. Do not dig under them or around the cuticle. File the edges of your nails with an emery board or nail file.  Apply a moisturizing lotion or petroleum jelly to the skin on your feet and to dry, brittle toenails. Use lotion that does not contain alcohol and is unscented. Do not apply lotion between your toes.   Shoes and socks  Wear clean socks or stockings every day. Make sure they are not too tight. Do not wear knee-high stockings since they may decrease blood flow to your legs.  Wear shoes that fit properly and have enough cushioning. Always look in your shoes before you put them on to be sure there are no objects inside.  To break in new shoes, wear them for just a few hours a day. This prevents injuries on your feet. Wounds, scrapes, corns, and calluses  Check your feet daily for blisters, cuts, bruises, sores, and redness. If you cannot see the bottom of your feet, use a mirror or ask someone for help.  Do not cut corns or calluses or try to remove them with medicine.  If you  find a minor scrape, cut, or break in the skin on your feet, keep it and the skin around it clean and dry. You may clean these areas with mild soap and water. Do not clean the area with peroxide, alcohol, or iodine.  If you have a wound, scrape, corn, or callus on your foot, look at it several times a day to make sure it is healing and not infected. Check for: ? Redness, swelling, or pain. ? Fluid or blood. ? Warmth. ? Pus or a bad smell.   General tips  Do not cross your legs. This may decrease blood flow to your feet.  Do not use heating pads or hot water bottles on your feet. They may burn your skin. If you have lost feeling in your feet or legs, you may not know this is happening until it is too late.  Protect your feet from hot and cold by wearing shoes, such as at the beach or on hot pavement.  Schedule a complete foot exam at least once a year (annually) or more often if you have foot problems. Report any cuts, sores, or bruises to your health care provider immediately. Where to find more information  American Diabetes Association: www.diabetes.org  Association of Diabetes Care & Education Specialists: www.diabeteseducator.org Contact a health care provider if:  You have a medical condition that increases your risk of infection and   you have any cuts, sores, or bruises on your feet.  You have an injury that is not healing.  You have redness on your legs or feet.  You feel burning or tingling in your legs or feet.  You have pain or cramps in your legs and feet.  Your legs or feet are numb.  Your feet always feel cold.  You have pain around any toenails. Get help right away if:  You have a wound, scrape, corn, or callus on your foot and: ? You have pain, swelling, or redness that gets worse. ? You have fluid or blood coming from the wound, scrape, corn, or callus. ? Your wound, scrape, corn, or callus feels warm to the touch. ? You have pus or a bad smell coming from  the wound, scrape, corn, or callus. ? You have a fever. ? You have a red line going up your leg. Summary  Check your feet every day for blisters, cuts, bruises, sores, and redness.  Apply a moisturizing lotion or petroleum jelly to the skin on your feet and to dry, brittle toenails.  Wear shoes that fit properly and have enough cushioning.  If you have foot problems, report any cuts, sores, or bruises to your health care provider immediately.  Schedule a complete foot exam at least once a year (annually) or more often if you have foot problems. This information is not intended to replace advice given to you by your health care provider. Make sure you discuss any questions you have with your health care provider. Document Revised: 07/22/2019 Document Reviewed: 07/22/2019 Elsevier Patient Education  2021 [employer]vier Inc.  

## 2020-03-28 DIAGNOSIS — I890 Lymphedema, not elsewhere classified: Secondary | ICD-10-CM | POA: Diagnosis not present

## 2020-03-28 DIAGNOSIS — N183 Chronic kidney disease, stage 3 unspecified: Secondary | ICD-10-CM | POA: Diagnosis not present

## 2020-03-28 DIAGNOSIS — I129 Hypertensive chronic kidney disease with stage 1 through stage 4 chronic kidney disease, or unspecified chronic kidney disease: Secondary | ICD-10-CM | POA: Diagnosis not present

## 2020-03-28 DIAGNOSIS — M6281 Muscle weakness (generalized): Secondary | ICD-10-CM | POA: Diagnosis not present

## 2020-03-28 DIAGNOSIS — R262 Difficulty in walking, not elsewhere classified: Secondary | ICD-10-CM | POA: Diagnosis not present

## 2020-03-28 DIAGNOSIS — L89623 Pressure ulcer of left heel, stage 3: Secondary | ICD-10-CM | POA: Diagnosis not present

## 2020-03-28 DIAGNOSIS — E1122 Type 2 diabetes mellitus with diabetic chronic kidney disease: Secondary | ICD-10-CM | POA: Diagnosis not present

## 2020-03-28 DIAGNOSIS — M170 Bilateral primary osteoarthritis of knee: Secondary | ICD-10-CM | POA: Diagnosis not present

## 2020-03-28 LAB — CMP14+EGFR
ALT: 7 IU/L (ref 0–32)
AST: 12 IU/L (ref 0–40)
Albumin/Globulin Ratio: 1.4 (ref 1.2–2.2)
Albumin: 4.1 g/dL (ref 3.7–4.7)
Alkaline Phosphatase: 109 IU/L (ref 44–121)
BUN/Creatinine Ratio: 15 (ref 12–28)
BUN: 15 mg/dL (ref 8–27)
Bilirubin Total: 0.6 mg/dL (ref 0.0–1.2)
CO2: 23 mmol/L (ref 20–29)
Calcium: 9.6 mg/dL (ref 8.7–10.3)
Chloride: 105 mmol/L (ref 96–106)
Creatinine, Ser: 1.02 mg/dL — ABNORMAL HIGH (ref 0.57–1.00)
Globulin, Total: 2.9 g/dL (ref 1.5–4.5)
Glucose: 97 mg/dL (ref 65–99)
Potassium: 3.9 mmol/L (ref 3.5–5.2)
Sodium: 143 mmol/L (ref 134–144)
Total Protein: 7 g/dL (ref 6.0–8.5)
eGFR: 58 mL/min/1.73 — ABNORMAL LOW (ref >59)

## 2020-03-28 LAB — T4, FREE: Free T4: 2.07 ng/dL — ABNORMAL HIGH (ref 0.82–1.77)

## 2020-03-28 LAB — LIPID PANEL
Chol/HDL Ratio: 2.5 ratio (ref 0.0–4.4)
Cholesterol, Total: 154 mg/dL (ref 100–199)
HDL: 61 mg/dL (ref >39)
LDL Chol Calc (NIH): 76 mg/dL (ref 0–99)
Triglycerides: 94 mg/dL (ref 0–149)
VLDL Cholesterol Cal: 17 mg/dL (ref 5–40)

## 2020-03-28 LAB — TSH: TSH: 3 u[IU]/mL (ref 0.450–4.500)

## 2020-03-28 LAB — HEMOGLOBIN A1C
Est. average glucose Bld gHb Est-mCnc: 100 mg/dL
Hgb A1c MFr Bld: 5.1 % (ref 4.8–5.6)

## 2020-03-29 ENCOUNTER — Telehealth: Payer: Medicare Other

## 2020-03-29 ENCOUNTER — Ambulatory Visit (INDEPENDENT_AMBULATORY_CARE_PROVIDER_SITE_OTHER): Payer: Medicare Other

## 2020-03-29 ENCOUNTER — Telehealth: Payer: Self-pay

## 2020-03-29 DIAGNOSIS — E1122 Type 2 diabetes mellitus with diabetic chronic kidney disease: Secondary | ICD-10-CM

## 2020-03-29 DIAGNOSIS — R269 Unspecified abnormalities of gait and mobility: Secondary | ICD-10-CM

## 2020-03-29 DIAGNOSIS — N183 Chronic kidney disease, stage 3 unspecified: Secondary | ICD-10-CM

## 2020-03-29 DIAGNOSIS — R600 Localized edema: Secondary | ICD-10-CM

## 2020-03-29 DIAGNOSIS — Z6841 Body Mass Index (BMI) 40.0 and over, adult: Secondary | ICD-10-CM

## 2020-03-29 DIAGNOSIS — L89623 Pressure ulcer of left heel, stage 3: Secondary | ICD-10-CM | POA: Diagnosis not present

## 2020-03-29 DIAGNOSIS — I129 Hypertensive chronic kidney disease with stage 1 through stage 4 chronic kidney disease, or unspecified chronic kidney disease: Secondary | ICD-10-CM

## 2020-03-29 DIAGNOSIS — I890 Lymphedema, not elsewhere classified: Secondary | ICD-10-CM | POA: Diagnosis not present

## 2020-03-29 DIAGNOSIS — M6281 Muscle weakness (generalized): Secondary | ICD-10-CM | POA: Diagnosis not present

## 2020-03-29 DIAGNOSIS — R262 Difficulty in walking, not elsewhere classified: Secondary | ICD-10-CM | POA: Diagnosis not present

## 2020-03-29 DIAGNOSIS — M170 Bilateral primary osteoarthritis of knee: Secondary | ICD-10-CM | POA: Diagnosis not present

## 2020-03-29 DIAGNOSIS — E7800 Pure hypercholesterolemia, unspecified: Secondary | ICD-10-CM

## 2020-03-29 NOTE — Telephone Encounter (Signed)
  Chronic Care Management   Outreach Note  03/29/2020 Name: Mary [name] MRN: [id number] DOB: [birth year]  Referred by: Dorothyann Peng, MD Reason for referral : Chronic Care Management (#2 RN CM FU Call Attempt)   A second unsuccessful telephone outreach was attempted today. The patient was referred to the case management team for assistance with care management and care coordination.   Follow Up Plan: A HIPAA compliant phone message was left for the patient providing contact information and requesting a return call. Telephone follow up appointment with care management team member scheduled for: 05/03/20  Delsa Sale, RN, BSN, CCM Care Management Coordinator [facility] Care Management/[facility]  Direct Phone: [phone]

## 2020-03-30 ENCOUNTER — Ambulatory Visit: Payer: Medicare Other

## 2020-03-30 DIAGNOSIS — N183 Chronic kidney disease, stage 3 unspecified: Secondary | ICD-10-CM | POA: Diagnosis not present

## 2020-03-30 DIAGNOSIS — E1122 Type 2 diabetes mellitus with diabetic chronic kidney disease: Secondary | ICD-10-CM

## 2020-03-30 DIAGNOSIS — R262 Difficulty in walking, not elsewhere classified: Secondary | ICD-10-CM | POA: Diagnosis not present

## 2020-03-30 DIAGNOSIS — I129 Hypertensive chronic kidney disease with stage 1 through stage 4 chronic kidney disease, or unspecified chronic kidney disease: Secondary | ICD-10-CM | POA: Diagnosis not present

## 2020-03-30 DIAGNOSIS — M6281 Muscle weakness (generalized): Secondary | ICD-10-CM | POA: Diagnosis not present

## 2020-03-30 DIAGNOSIS — E7800 Pure hypercholesterolemia, unspecified: Secondary | ICD-10-CM | POA: Diagnosis not present

## 2020-03-30 DIAGNOSIS — M170 Bilateral primary osteoarthritis of knee: Secondary | ICD-10-CM | POA: Diagnosis not present

## 2020-03-30 DIAGNOSIS — I890 Lymphedema, not elsewhere classified: Secondary | ICD-10-CM | POA: Diagnosis not present

## 2020-03-30 DIAGNOSIS — L89623 Pressure ulcer of left heel, stage 3: Secondary | ICD-10-CM | POA: Diagnosis not present

## 2020-03-30 NOTE — Patient Instructions (Signed)
Social Worker Visit Information  Goals we discussed today:  Goals Addressed            This Visit's Progress    COMPLETED: Identify caregiver resources       Timeframe:  Long-Range Goal Priority:  High Start Date:  1.11.22                           Expected End Date:  4.11.22                     Goal Met 3.17.22  Patient Goals/Self-Care Activities Over the next 90 days, patient will:   - Patient will remain engaged with CAP/DA program     wotk with SW to manage care coordination needs   On track    Timeframe:  Long-Range Goal Priority:  High Start Date:  2.1.22                          Expected End Date: 5.2.22                       Next planned outreach: 4.14.22  Patient Goals/Self-Care Activities Over the next 30 days, patient will:   - Patient will self administer medications as prescribed -Patient will attend all scheduled provider appointments -Patient will call provider office for new concerns or questions -Contact SW as needed prior to next scheduled call        Materials Provided: Verbal education about resources provided by phone  Follow Up Plan: SW will follow up with patient by phone over the next month   [name], BSW, CDP Social Worker, Certified Dementia Practitioner [place] [facility] [place] [facility] [phone]

## 2020-03-30 NOTE — Chronic Care Management (AMB) (Signed)
Chronic Care Management    Social Work Note  03/30/2020 Name: Mary [name] MRN: [id number] DOB: 1945/03/01  Mary [name] is a 75 y.o. year old female who is a primary care patient of Glendale Chard, MD. The CCM team was consulted to assist the patient with chronic disease management and/or care coordination needs related to: Transportation Needs  and [employer] .   Engaged with patient by telephone for follow up visit in response to provider referral for social work chronic care management and care coordination services.   Consent to Services:  The patient was given information about Chronic Care Management services, agreed to services, and gave verbal consent prior to initiation of services.  Please see initial visit note for detailed documentation.   Patient agreed to services and consent obtained.   Assessment: Review of patient past medical history, allergies, medications, and health status, including review of relevant consultants reports was performed today as part of a comprehensive evaluation and provision of chronic care management and care coordination services.     SDOH (Social Determinants of Health) assessments and interventions performed:    Advanced Directives Status: Not addressed in this encounter.  CCM Care Plan  No Known Allergies  Outpatient Encounter Medications as of 03/30/2020  Medication Sig Note  . aspirin 81 MG chewable tablet 1 tablet   . Assure Comfort Lancets 28G MISC    . Blood Glucose Monitoring Suppl (ONETOUCH VERIO FLEX SYSTEM) w/Device KIT Use as directed to check blood sugars 2 times per day dx: e11.65   . cetirizine (ZYRTEC) 10 MG tablet TAKE 1 TABLET BY MOUTH EVERY DAY   . Cyanocobalamin (VITAMIN B 12) 500 MCG TABS 1 tablet   . Dexchlorpheniramine Maleate (RYCLORA) 2 MG/5ML SOLN 5cc twice daily as needed   . diphenoxylate-atropine (LOMOTIL) 2.5-0.025 MG tablet Take by mouth.   . dorzolamide-timolol (COSOPT) 22.3-6.8 MG/ML  ophthalmic solution Place 1 drop into the right eye 2 (two) times daily.   . fluticasone (FLONASE) 50 MCG/ACT nasal spray SPRAY 1 SPRAY INTO EACH NOSTRIL EVERY DAY   . furosemide (LASIX) 40 MG tablet Take 1 tablet (40 mg total) by mouth daily.   [name] glucose blood (ONETOUCH VERIO) test strip Use as directed to check blood sugars 2 times per day dx: e11.65   . HYDROcodone-acetaminophen (NORCO) 10-325 MG tablet Take 1 tablet by mouth 5 (five) times daily as needed.   [name] KLOR-CON M20 20 MEQ tablet Take 20 mEq by mouth daily. with food 02/23/2018: Patient instructed to hold this medication due to abnormal Potassium level of 6.2 on 02/18/18, per MD, Dr. Glendale Chard. Patient was given verbal education related to complications from hypo and hyperkalemia and importance of following MD recommendations. Patient instructed to resume this medication when directed by PCP. Patient verbalizes understanding.   [name] levothyroxine (SYNTHROID) 125 MCG tablet Take 125 mcg by mouth daily before breakfast.   . magnesium oxide (MAG-OX) 400 MG tablet TAKE 1 TABLET BY MOUTH EVERY DAY IN THE EVENING   . metoprolol succinate (TOPROL-XL) 50 MG 24 hr tablet Take 1 tablet (50 mg total) by mouth daily.   [name] omeprazole (PRILOSEC) 20 MG capsule TAKE 1 CAPSULE BY MOUTH  DAILY   . ondansetron (ZOFRAN) 4 MG tablet Take by mouth.   [name] Delica Lancets 71G MISC Use as directed to check blood sugars 2 times per day dx: e11.65   . oxyCODONE-acetaminophen (PERCOCET) 7.5-325 MG tablet 1 tablet as needed   . pravastatin (PRAVACHOL) 40  MG tablet TAKE 1 TABLET BY MOUTH EVERY DAY   . predniSONE (DELTASONE) 10 MG tablet Take by mouth.    . sulfamethoxazole-trimethoprim (BACTRIM DS) 800-160 MG tablet Take 1 tablet by mouth 2 (two) times daily.   [name] telmisartan (MICARDIS) 80 MG tablet TAKE 1 TABLET BY MOUTH  DAILY   . triamcinolone (KENALOG) 0.1 % APPLY TO AFFECTED AREA TWICE A DAY    No facility-administered encounter medications on file as of  03/30/2020.    Patient Active Problem List   Diagnosis Date Noted  . Change in bowel habit 01/20/2020  . Chronic anemia 01/20/2020  . Colon cancer screening 01/20/2020  . Diabetes mellitus with stage 3 chronic kidney disease ([place]) 02/21/2018  . 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 Primary osteoarthritis of both knees 02/21/2018  . Class 3 severe obesity due to excess calories with serious comorbidity and body mass index (BMI) of 45.0 to 49.9 in adult ([place]) 02/21/2018  . Skin ulcer of right foot with fat layer exposed ([place]) 02/18/2018  . Morbid (severe) obesity due to excess calories ([place]) 11/18/2017  . Knee pain 07/16/2015  . Lateral dislocation of right patella 05/04/2015  . Rupture of right quadriceps tendon 05/24/2014  . S/P TKR (total knee replacement) 05/05/2012    Conditions to be addressed/monitored: DMII and CKD Stage III; Transportation and Limited access to caregiver  Care Plan : Social Work Care Plan  Updates made by [name] since 03/30/2020 12:00 AM    Problem: Care Coordination     Long-Range Goal: Identify caregiver resources Completed 03/30/2020  Start Date: 01/25/2020  Expected End Date: 04/24/2020  This Visit's Progress: On track  Recent Progress: On track  Priority: High  Note:   Current Barriers:  . Limited social support . Level of care concerns . ADL IADL limitations . Limited access to caregiver . Chronic conditions including DM, CKD III, and HTN  Social Work Clinical Goal(s):  [name] Over the next 90 days, patient will work with SW to address concerns related to caregiver resources . Over the next 30 days, patient will follow up with CAP/DA program * as directed by SW  Interventions: . 1:1 collaboration with Glendale Chard, MD regarding development and update of comprehensive plan of care as evidenced by provider attestation and co-signature . Inter-disciplinary care team collaboration (see longitudinal plan of care) . Successful outbound  call placed to the patient to assess goal progression . Determined the patient has received a letter from the CAP/DA program indicating the patient is number 80 on the wait list . Encouraged the patient to stay in touch with CAP/DA and return any calls from this program she may receive regarding services . Goal met   Patient Goals/Self-Care Activities Over the next 90 days, patient will:   - Remain engaged with CAP/DA program   Follow up Plan: Client will remain engaged with CAP/DA program    Long-Range Goal: Identify resources to assist with care coordination needs   Start Date: 02/15/2020  Expected End Date: 05/15/2020  Recent Progress: On track  Priority: High  Note:   Current Barriers:  . Limited social support . Transportation . ADL IADL limitations . Chronic conditions including DM II, CKD III, and obesity which put patient at increased risk for hospitalization   Social Work Clinical Goal(s):  [name] Over the next 90 days, patient will work with SW to address concerns related to care coordination  Interventions: . 1:1 collaboration with Glendale Chard, MD regarding development and update of  comprehensive plan of care as evidenced by provider attestation and co-signature . Inter-disciplinary care team collaboration (see longitudinal plan of care) . Successful outbound call placed to the patient to assess goal progression . Determined the patient has been in contact with [facility] who is scheduled to deliver her power wheelchair on 04/11/20  . Discussed the patient is interested in the [facility] system if it is covered by insurance . Advised the patient that the [facility] website states it is not covered by Medicare- A started kit will cost $594.00 with refills of 30 external catheters costing $195.00  . Determined the patient is unable to afford this system . Advised the patient SW could assist with calling her health plan to identify if an alternative female external catheter  would be covered- patient declined . Discussed the patient is currently working with [facility] but is interested in transportation resources upon discharge to continue visiting the outpatient clinic in [place] . Advised the patient the [facility] transitioned out of her role - SW will work to connect with new supervisor to determine patient eligibility to access service . Scheduled follow up call over the next month  Patient Goals/Self-Care Activities Over the next 30 days, patient will:   - Patient will self administer medications as prescribed Patient will attend all scheduled provider appointments Patient will call provider office for new concerns or questions Contact SW as needed prior to next scheduled call  Follow up Plan: SW will follow up with patient by phone over the next month       Follow Up Plan: SW will follow up with patient by phone over the next month.      [name], BSW, CDP Social Worker, Certified Dementia Practitioner [facility] [phone]  Total time spent performing care coordination and/or care management activities with the patient by phone or face to face = 30 minutes.

## 2020-03-31 NOTE — Chronic Care Management (AMB) (Signed)
Chronic Care Management   CCM RN Visit Note  03/29/2020 Name: Mary [name] MRN: [id number] DOB: 1945/06/26  Subjective: Mary [name] is a 75 y.o. year old female who is a primary care patient of Glendale Chard, MD. The care management team was consulted for assistance with disease management and care coordination needs.    Engaged with patient by telephone for follow up visit in response to provider referral for case management and/or care coordination services.   Consent to Services:  The patient was given information about Chronic Care Management services, agreed to services, and gave verbal consent prior to initiation of services.  Please see initial visit note for detailed documentation.   Patient agreed to services and verbal consent obtained.   Assessment: Review of patient past medical history, allergies, medications, health status, including review of consultants reports, laboratory and other test data, was performed as part of comprehensive evaluation and provision of chronic care management services.   SDOH (Social Determinants of Health) assessments and interventions performed:  Yes, no acute needs  CCM Care Plan  No Known Allergies  Outpatient Encounter Medications as of 03/29/2020  Medication Sig Note  . aspirin 81 MG chewable tablet 1 tablet   . Assure Comfort Lancets 28G MISC    . Blood Glucose Monitoring Suppl (ONETOUCH VERIO FLEX SYSTEM) w/Device KIT Use as directed to check blood sugars 2 times per day dx: e11.65   . cetirizine (ZYRTEC) 10 MG tablet TAKE 1 TABLET BY MOUTH EVERY DAY   . Cyanocobalamin (VITAMIN B 12) 500 MCG TABS 1 tablet   . Dexchlorpheniramine Maleate (RYCLORA) 2 MG/5ML SOLN 5cc twice daily as needed   . diphenoxylate-atropine (LOMOTIL) 2.5-0.025 MG tablet Take by mouth.   . dorzolamide-timolol (COSOPT) 22.3-6.8 MG/ML ophthalmic solution Place 1 drop into the right eye 2 (two) times daily.   . fluticasone (FLONASE) 50 MCG/ACT nasal spray  SPRAY 1 SPRAY INTO EACH NOSTRIL EVERY DAY   . furosemide (LASIX) 40 MG tablet Take 1 tablet (40 mg total) by mouth daily.   [name] glucose blood (ONETOUCH VERIO) test strip Use as directed to check blood sugars 2 times per day dx: e11.65   . HYDROcodone-acetaminophen (NORCO) 10-325 MG tablet Take 1 tablet by mouth 5 (five) times daily as needed.   [name] KLOR-CON M20 20 MEQ tablet Take 20 mEq by mouth daily. with food 02/23/2018: Patient instructed to hold this medication due to abnormal Potassium level of 6.2 on 02/18/18, per MD, Dr. Glendale Chard. Patient was given verbal education related to complications from hypo and hyperkalemia and importance of following MD recommendations. Patient instructed to resume this medication when directed by PCP. Patient verbalizes understanding.   [name] levothyroxine (SYNTHROID) 125 MCG tablet Take 125 mcg by mouth daily before breakfast.   . magnesium oxide (MAG-OX) 400 MG tablet TAKE 1 TABLET BY MOUTH EVERY DAY IN THE EVENING   . metoprolol succinate (TOPROL-XL) 50 MG 24 hr tablet Take 1 tablet (50 mg total) by mouth daily.   [name] omeprazole (PRILOSEC) 20 MG capsule TAKE 1 CAPSULE BY MOUTH  DAILY   . ondansetron (ZOFRAN) 4 MG tablet Take by mouth.   [name] Delica Lancets 60F MISC Use as directed to check blood sugars 2 times per day dx: e11.65   . oxyCODONE-acetaminophen (PERCOCET) 7.5-325 MG tablet 1 tablet as needed   . pravastatin (PRAVACHOL) 40 MG tablet TAKE 1 TABLET BY MOUTH EVERY DAY   . predniSONE (DELTASONE) 10 MG tablet Take by mouth.    [name]  sulfamethoxazole-trimethoprim (BACTRIM DS) 800-160 MG tablet Take 1 tablet by mouth 2 (two) times daily.   [name] telmisartan (MICARDIS) 80 MG tablet TAKE 1 TABLET BY MOUTH  DAILY   . triamcinolone (KENALOG) 0.1 % APPLY TO AFFECTED AREA TWICE A DAY    No facility-administered encounter medications on file as of 03/29/2020.    Patient Active Problem List   Diagnosis Date Noted  . Change in bowel habit 01/20/2020  . Chronic anemia  01/20/2020  . Colon cancer screening 01/20/2020  . Diabetes mellitus with stage 3 chronic kidney disease ([place]) 02/21/2018  . 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 Primary osteoarthritis of both knees 02/21/2018  . Class 3 severe obesity due to excess calories with serious comorbidity and body mass index (BMI) of 45.0 to 49.9 in adult ([place]) 02/21/2018  . Skin ulcer of right foot with fat layer exposed ([place]) 02/18/2018  . Morbid (severe) obesity due to excess calories ([place]) 11/18/2017  . Knee pain 07/16/2015  . Lateral dislocation of right patella 05/04/2015  . Rupture of right quadriceps tendon 05/24/2014  . S/P TKR (total knee replacement) 05/05/2012    Conditions to be addressed/monitored:Diabetes Mellitus with stage 3 Chronic Kidney disease, Parenchymal renal hypertension, Class 3 severe obesity, Edema of left lower extremity, Primary Hypothyroidism    Care Plan : Impaired Physical Mobility  Updates made by Lynne Logan, RN since 03/31/2020 12:00 AM    Problem: Impaired Physical Mobility   Priority: High    Long-Range Goal: Impaired Physical Mobility   Start Date: 03/29/2020  Expected End Date: 09/29/2020  This Visit's Progress: On track  Priority: High  Note:   Current Barriers:   Ineffective Self Health Maintenance  Lacks social connections  Unable to perform ADLs independently  Unable to perform IADLs independently Clinical Goal(s):  [name] Collaboration with Glendale Chard, MD regarding development and update of comprehensive plan of care as evidenced by provider attestation and co-signature . Inter-disciplinary care team collaboration (see longitudinal plan of care)  patient will work with care management team to address care coordination and chronic disease management needs related to Disease Management  Educational Needs  Care Coordination  Medication Management and Education  Psychosocial Support   Interventions:   Evaluation of current treatment plan  related to  Diabetes Mellitus with stage 3 Chronic Kidney disease, Parenchymal renal hypertension, Class 3 severe obesity, Edema of left lower extremity, Primary Hypothyroidism , self-management and patient's adherence to plan as established by provider.  Collaboration with Glendale Chard, MD regarding development and update of comprehensive plan of care as evidenced by provider attestation       and co-signature  Inter-disciplinary care team collaboration (see longitudinal plan of care)  Determined patient has experienced at least 2 falls within her home over the past couple of months, she denies injury, called EMS to help her up to her chair/bed  Reviewed fall prevention tips and discussed any home safety concerns  Determined patient is working with in home PT through [facility] patient will receive her new power W/C on 05/12/20  Instructed patient to use her DME at all times to help prevent falls and preserve injury  Discussed plans with patient for ongoing care management follow up and provided patient with direct contact information for care management team Self Care Activities:  . Call PCP for questions or concerns . Continue to take all medications exactly as prescribed  . Keep all scheduled follow up appointments  . Let the embedded  BSW know about any transportation issues that may arise . Call Pharmacy for refills 7 days before running out of medication  . Follow fall prevention tips and use DME at all times as discussed  . Continue to work with in home PT and follow HEP Patient Goals: - to receive power W/C to use for mobility  Follow Up Plan: Telephone follow up appointment with care management team member scheduled for: 05/03/20   Care Plan : Impaired Skin Intregrity  Updates made by Lynne Logan, RN since 03/31/2020 12:00 AM    Problem: Impaired Skin Integrity   Priority: High    Long-Range Goal: Impaired Skin Integrity   Start Date: 03/29/2020   Expected End Date: 09/29/2020  This Visit's Progress: On track  Priority: High  Note:   Current Barriers:   Ineffective Self Health Maintenance  Lacks social connections  Unable to perform ADLs independently  Unable to perform IADLs independently Clinical Goal(s):  [name] Collaboration with Glendale Chard, MD regarding development and update of comprehensive plan of care as evidenced by provider attestation and co-signature . Inter-disciplinary care team collaboration (see longitudinal plan of care)  patient will work with care management team to address care coordination and chronic disease management needs related to Disease Management  Educational Needs  Care Coordination  Medication Management and Education  Psychosocial Support   Interventions:   Evaluation of current treatment plan related to Diabetes Mellitus with stage 3 Chronic Kidney disease, Parenchymal renal hypertension, Class 3 severe obesity, Edema of left lower extremity, Primary Hypothyroidism, self-management and patient's adherence to plan as established by provider.  Collaboration with Glendale Chard, MD regarding development and update of comprehensive plan of care as evidenced by provider attestation       and co-signature  Inter-disciplinary care team collaboration (see longitudinal plan of care)  Determined patient is receiving [place] services from [facility] for in home PT and wound care to the wound on her left heel  Determined patient states her wound is healing by evidence by the wound is getting smaller   Educated patient on importance of adhering to lymphedema leg pumps, to use as directed  Educated on skin care and skin breakdown prevention; importance of keeping lower extremities elevated while resting   Discussed plans with patient for ongoing care management follow up and provided patient with direct contact information for care management team Self Care Activities:  . Call PCP for  questions or concerns . Continue to take all medications exactly as prescribed  . Keep all scheduled follow up appointments  . Complete daily skin inspections and report early sign of skin breakdown promptly  . Keep skin clean, dry and intact; Follow skin breakdown prevention techniques as discussed  . Follow recommendations for wound care to left heel Patient Goals: - wound healing  Follow Up Plan: Telephone follow up appointment with care management team member scheduled for: 05/03/20    Plan:Telephone follow up appointment with care management team member scheduled for:  05/03/20  Barb Merino, RN, BSN, CCM Care Management Coordinator [facility]  Direct Phone: [phone]

## 2020-03-31 NOTE — Patient Instructions (Signed)
Goals Addressed    Other   .  Fall prevention   On track     Timeframe:  Long-Range Goal Priority:  High Start Date: 03/29/20                            Expected End Date: 09/29/20  Next Follow Up date; 05/03/20  Patient Goals/Self Care Activities:   . Call PCP for questions or concerns . Continue to take all medications exactly as prescribed  . Keep all scheduled follow up appointments  . Let the embedded BSW know about any transportation issues that may arise . Call Pharmacy for refills 7 days before running out of medication  . Follow fall prevention tips and use DME at all times as discussed  . Continue to work with in home PT and follow HEP                         .  Skin Integrity maintained or improved   On track     Timeframe:  Long-Range Goal Priority:  High Start Date: 03/29/20                            Expected End Date: 09/29/20  Next Follow Up date: 05/03/20  Patient Goals/Self Care Activities:   . Call PCP for questions or concerns . Continue to take all medications exactly as prescribed  . Keep all scheduled follow up appointments  . Complete daily skin inspections and report early sign of skin breakdown promptly  . Keep skin clean, dry and intact; Follow skin breakdown prevention techniques as discussed  . Follow recommendations for wound care to left heel

## 2020-04-03 DIAGNOSIS — M6281 Muscle weakness (generalized): Secondary | ICD-10-CM | POA: Diagnosis not present

## 2020-04-03 DIAGNOSIS — R262 Difficulty in walking, not elsewhere classified: Secondary | ICD-10-CM | POA: Diagnosis not present

## 2020-04-03 DIAGNOSIS — L89623 Pressure ulcer of left heel, stage 3: Secondary | ICD-10-CM | POA: Diagnosis not present

## 2020-04-03 DIAGNOSIS — I129 Hypertensive chronic kidney disease with stage 1 through stage 4 chronic kidney disease, or unspecified chronic kidney disease: Secondary | ICD-10-CM | POA: Diagnosis not present

## 2020-04-03 DIAGNOSIS — I890 Lymphedema, not elsewhere classified: Secondary | ICD-10-CM | POA: Diagnosis not present

## 2020-04-03 DIAGNOSIS — M170 Bilateral primary osteoarthritis of knee: Secondary | ICD-10-CM | POA: Diagnosis not present

## 2020-04-03 DIAGNOSIS — N183 Chronic kidney disease, stage 3 unspecified: Secondary | ICD-10-CM | POA: Diagnosis not present

## 2020-04-03 DIAGNOSIS — E1122 Type 2 diabetes mellitus with diabetic chronic kidney disease: Secondary | ICD-10-CM | POA: Diagnosis not present

## 2020-04-04 DIAGNOSIS — N183 Chronic kidney disease, stage 3 unspecified: Secondary | ICD-10-CM | POA: Diagnosis not present

## 2020-04-04 DIAGNOSIS — M170 Bilateral primary osteoarthritis of knee: Secondary | ICD-10-CM | POA: Diagnosis not present

## 2020-04-04 DIAGNOSIS — I129 Hypertensive chronic kidney disease with stage 1 through stage 4 chronic kidney disease, or unspecified chronic kidney disease: Secondary | ICD-10-CM | POA: Diagnosis not present

## 2020-04-04 DIAGNOSIS — L89623 Pressure ulcer of left heel, stage 3: Secondary | ICD-10-CM | POA: Diagnosis not present

## 2020-04-04 DIAGNOSIS — M6281 Muscle weakness (generalized): Secondary | ICD-10-CM | POA: Diagnosis not present

## 2020-04-04 DIAGNOSIS — R262 Difficulty in walking, not elsewhere classified: Secondary | ICD-10-CM | POA: Diagnosis not present

## 2020-04-04 DIAGNOSIS — I890 Lymphedema, not elsewhere classified: Secondary | ICD-10-CM | POA: Diagnosis not present

## 2020-04-04 DIAGNOSIS — E1122 Type 2 diabetes mellitus with diabetic chronic kidney disease: Secondary | ICD-10-CM | POA: Diagnosis not present

## 2020-04-06 DIAGNOSIS — E1122 Type 2 diabetes mellitus with diabetic chronic kidney disease: Secondary | ICD-10-CM | POA: Diagnosis not present

## 2020-04-06 DIAGNOSIS — I129 Hypertensive chronic kidney disease with stage 1 through stage 4 chronic kidney disease, or unspecified chronic kidney disease: Secondary | ICD-10-CM | POA: Diagnosis not present

## 2020-04-06 DIAGNOSIS — M6281 Muscle weakness (generalized): Secondary | ICD-10-CM | POA: Diagnosis not present

## 2020-04-06 DIAGNOSIS — M170 Bilateral primary osteoarthritis of knee: Secondary | ICD-10-CM | POA: Diagnosis not present

## 2020-04-06 DIAGNOSIS — L89893 Pressure ulcer of other site, stage 3: Secondary | ICD-10-CM | POA: Diagnosis not present

## 2020-04-06 DIAGNOSIS — R262 Difficulty in walking, not elsewhere classified: Secondary | ICD-10-CM | POA: Diagnosis not present

## 2020-04-06 DIAGNOSIS — N183 Chronic kidney disease, stage 3 unspecified: Secondary | ICD-10-CM | POA: Diagnosis not present

## 2020-04-06 DIAGNOSIS — L89623 Pressure ulcer of left heel, stage 3: Secondary | ICD-10-CM | POA: Diagnosis not present

## 2020-04-06 DIAGNOSIS — I890 Lymphedema, not elsewhere classified: Secondary | ICD-10-CM | POA: Diagnosis not present

## 2020-04-07 DIAGNOSIS — I890 Lymphedema, not elsewhere classified: Secondary | ICD-10-CM | POA: Diagnosis not present

## 2020-04-07 DIAGNOSIS — M6281 Muscle weakness (generalized): Secondary | ICD-10-CM | POA: Diagnosis not present

## 2020-04-07 DIAGNOSIS — R262 Difficulty in walking, not elsewhere classified: Secondary | ICD-10-CM | POA: Diagnosis not present

## 2020-04-07 DIAGNOSIS — I129 Hypertensive chronic kidney disease with stage 1 through stage 4 chronic kidney disease, or unspecified chronic kidney disease: Secondary | ICD-10-CM | POA: Diagnosis not present

## 2020-04-07 DIAGNOSIS — E1122 Type 2 diabetes mellitus with diabetic chronic kidney disease: Secondary | ICD-10-CM | POA: Diagnosis not present

## 2020-04-07 DIAGNOSIS — N183 Chronic kidney disease, stage 3 unspecified: Secondary | ICD-10-CM | POA: Diagnosis not present

## 2020-04-07 DIAGNOSIS — L89623 Pressure ulcer of left heel, stage 3: Secondary | ICD-10-CM | POA: Diagnosis not present

## 2020-04-07 DIAGNOSIS — M170 Bilateral primary osteoarthritis of knee: Secondary | ICD-10-CM | POA: Diagnosis not present

## 2020-04-10 DIAGNOSIS — L89623 Pressure ulcer of left heel, stage 3: Secondary | ICD-10-CM | POA: Diagnosis not present

## 2020-04-10 DIAGNOSIS — I890 Lymphedema, not elsewhere classified: Secondary | ICD-10-CM | POA: Diagnosis not present

## 2020-04-10 DIAGNOSIS — R262 Difficulty in walking, not elsewhere classified: Secondary | ICD-10-CM | POA: Diagnosis not present

## 2020-04-10 DIAGNOSIS — M170 Bilateral primary osteoarthritis of knee: Secondary | ICD-10-CM | POA: Diagnosis not present

## 2020-04-10 DIAGNOSIS — M6281 Muscle weakness (generalized): Secondary | ICD-10-CM | POA: Diagnosis not present

## 2020-04-10 DIAGNOSIS — I129 Hypertensive chronic kidney disease with stage 1 through stage 4 chronic kidney disease, or unspecified chronic kidney disease: Secondary | ICD-10-CM | POA: Diagnosis not present

## 2020-04-10 DIAGNOSIS — E1122 Type 2 diabetes mellitus with diabetic chronic kidney disease: Secondary | ICD-10-CM | POA: Diagnosis not present

## 2020-04-10 DIAGNOSIS — N183 Chronic kidney disease, stage 3 unspecified: Secondary | ICD-10-CM | POA: Diagnosis not present

## 2020-04-11 DIAGNOSIS — E1122 Type 2 diabetes mellitus with diabetic chronic kidney disease: Secondary | ICD-10-CM | POA: Diagnosis not present

## 2020-04-11 DIAGNOSIS — R262 Difficulty in walking, not elsewhere classified: Secondary | ICD-10-CM | POA: Diagnosis not present

## 2020-04-11 DIAGNOSIS — M6281 Muscle weakness (generalized): Secondary | ICD-10-CM | POA: Diagnosis not present

## 2020-04-11 DIAGNOSIS — N183 Chronic kidney disease, stage 3 unspecified: Secondary | ICD-10-CM | POA: Diagnosis not present

## 2020-04-11 DIAGNOSIS — I129 Hypertensive chronic kidney disease with stage 1 through stage 4 chronic kidney disease, or unspecified chronic kidney disease: Secondary | ICD-10-CM | POA: Diagnosis not present

## 2020-04-11 DIAGNOSIS — M170 Bilateral primary osteoarthritis of knee: Secondary | ICD-10-CM | POA: Diagnosis not present

## 2020-04-11 DIAGNOSIS — L89623 Pressure ulcer of left heel, stage 3: Secondary | ICD-10-CM | POA: Diagnosis not present

## 2020-04-11 DIAGNOSIS — I890 Lymphedema, not elsewhere classified: Secondary | ICD-10-CM | POA: Diagnosis not present

## 2020-04-12 DIAGNOSIS — N183 Chronic kidney disease, stage 3 unspecified: Secondary | ICD-10-CM | POA: Diagnosis not present

## 2020-04-12 DIAGNOSIS — M6281 Muscle weakness (generalized): Secondary | ICD-10-CM | POA: Diagnosis not present

## 2020-04-12 DIAGNOSIS — E1122 Type 2 diabetes mellitus with diabetic chronic kidney disease: Secondary | ICD-10-CM | POA: Diagnosis not present

## 2020-04-12 DIAGNOSIS — I129 Hypertensive chronic kidney disease with stage 1 through stage 4 chronic kidney disease, or unspecified chronic kidney disease: Secondary | ICD-10-CM | POA: Diagnosis not present

## 2020-04-12 DIAGNOSIS — I890 Lymphedema, not elsewhere classified: Secondary | ICD-10-CM | POA: Diagnosis not present

## 2020-04-12 DIAGNOSIS — M170 Bilateral primary osteoarthritis of knee: Secondary | ICD-10-CM | POA: Diagnosis not present

## 2020-04-12 DIAGNOSIS — L89623 Pressure ulcer of left heel, stage 3: Secondary | ICD-10-CM | POA: Diagnosis not present

## 2020-04-12 DIAGNOSIS — R262 Difficulty in walking, not elsewhere classified: Secondary | ICD-10-CM | POA: Diagnosis not present

## 2020-04-13 ENCOUNTER — Encounter (HOSPITAL_COMMUNITY): Payer: Self-pay | Admitting: Physical Therapy

## 2020-04-13 DIAGNOSIS — M170 Bilateral primary osteoarthritis of knee: Secondary | ICD-10-CM | POA: Diagnosis not present

## 2020-04-13 DIAGNOSIS — L89623 Pressure ulcer of left heel, stage 3: Secondary | ICD-10-CM | POA: Diagnosis not present

## 2020-04-13 DIAGNOSIS — E1122 Type 2 diabetes mellitus with diabetic chronic kidney disease: Secondary | ICD-10-CM | POA: Diagnosis not present

## 2020-04-13 DIAGNOSIS — I129 Hypertensive chronic kidney disease with stage 1 through stage 4 chronic kidney disease, or unspecified chronic kidney disease: Secondary | ICD-10-CM | POA: Diagnosis not present

## 2020-04-13 DIAGNOSIS — M6281 Muscle weakness (generalized): Secondary | ICD-10-CM | POA: Diagnosis not present

## 2020-04-13 DIAGNOSIS — N183 Chronic kidney disease, stage 3 unspecified: Secondary | ICD-10-CM | POA: Diagnosis not present

## 2020-04-13 DIAGNOSIS — R262 Difficulty in walking, not elsewhere classified: Secondary | ICD-10-CM | POA: Diagnosis not present

## 2020-04-13 DIAGNOSIS — I890 Lymphedema, not elsewhere classified: Secondary | ICD-10-CM | POA: Diagnosis not present

## 2020-04-13 NOTE — Therapy (Signed)
[place], [place], [address] Phone: [phone]   Fax:  [phone]  Patient Details  Name: LEON GOODNOW MRN: [id number] Date of Birth: 02-02-1945 Referring Provider:  No ref. provider found  Encounter Date: 04/13/2020   PHYSICAL THERAPY DISCHARGE SUMMARY  Visits from Start of Care: 3  Current functional level related to goals / functional outcomes: Unknown pt did not return for visits    Remaining deficits: Significant lymphedema and wound   Education / Equipment: HEP to improve lymphatic circulation  Plan: Patient agrees to discharge.  Patient goals were not met. Patient is being discharged due to not returning since the last visit.  ?????     Rayetta Humphrey, PT CLT [phone] 04/13/2020, 8:51 AM  [place], [place], [address] Phone: [phone]   Fax:  [phone]

## 2020-04-17 DIAGNOSIS — M25512 Pain in left shoulder: Secondary | ICD-10-CM | POA: Diagnosis not present

## 2020-04-17 DIAGNOSIS — G894 Chronic pain syndrome: Secondary | ICD-10-CM | POA: Diagnosis not present

## 2020-04-17 DIAGNOSIS — M150 Primary generalized (osteo)arthritis: Secondary | ICD-10-CM | POA: Diagnosis not present

## 2020-04-17 DIAGNOSIS — M25561 Pain in right knee: Secondary | ICD-10-CM | POA: Diagnosis not present

## 2020-04-18 DIAGNOSIS — L89623 Pressure ulcer of left heel, stage 3: Secondary | ICD-10-CM | POA: Diagnosis not present

## 2020-04-18 DIAGNOSIS — L89893 Pressure ulcer of other site, stage 3: Secondary | ICD-10-CM | POA: Diagnosis not present

## 2020-04-18 DIAGNOSIS — I890 Lymphedema, not elsewhere classified: Secondary | ICD-10-CM | POA: Diagnosis not present

## 2020-04-18 DIAGNOSIS — N183 Chronic kidney disease, stage 3 unspecified: Secondary | ICD-10-CM | POA: Diagnosis not present

## 2020-04-18 DIAGNOSIS — M170 Bilateral primary osteoarthritis of knee: Secondary | ICD-10-CM | POA: Diagnosis not present

## 2020-04-18 DIAGNOSIS — R262 Difficulty in walking, not elsewhere classified: Secondary | ICD-10-CM | POA: Diagnosis not present

## 2020-04-18 DIAGNOSIS — E1122 Type 2 diabetes mellitus with diabetic chronic kidney disease: Secondary | ICD-10-CM | POA: Diagnosis not present

## 2020-04-18 DIAGNOSIS — I129 Hypertensive chronic kidney disease with stage 1 through stage 4 chronic kidney disease, or unspecified chronic kidney disease: Secondary | ICD-10-CM | POA: Diagnosis not present

## 2020-04-19 DIAGNOSIS — I890 Lymphedema, not elsewhere classified: Secondary | ICD-10-CM | POA: Diagnosis not present

## 2020-04-19 DIAGNOSIS — R262 Difficulty in walking, not elsewhere classified: Secondary | ICD-10-CM | POA: Diagnosis not present

## 2020-04-19 DIAGNOSIS — N183 Chronic kidney disease, stage 3 unspecified: Secondary | ICD-10-CM | POA: Diagnosis not present

## 2020-04-19 DIAGNOSIS — L89893 Pressure ulcer of other site, stage 3: Secondary | ICD-10-CM | POA: Diagnosis not present

## 2020-04-19 DIAGNOSIS — I129 Hypertensive chronic kidney disease with stage 1 through stage 4 chronic kidney disease, or unspecified chronic kidney disease: Secondary | ICD-10-CM | POA: Diagnosis not present

## 2020-04-19 DIAGNOSIS — E1122 Type 2 diabetes mellitus with diabetic chronic kidney disease: Secondary | ICD-10-CM | POA: Diagnosis not present

## 2020-04-19 DIAGNOSIS — M170 Bilateral primary osteoarthritis of knee: Secondary | ICD-10-CM | POA: Diagnosis not present

## 2020-04-19 DIAGNOSIS — L89623 Pressure ulcer of left heel, stage 3: Secondary | ICD-10-CM | POA: Diagnosis not present

## 2020-04-20 DIAGNOSIS — L89893 Pressure ulcer of other site, stage 3: Secondary | ICD-10-CM | POA: Diagnosis not present

## 2020-04-20 DIAGNOSIS — M170 Bilateral primary osteoarthritis of knee: Secondary | ICD-10-CM | POA: Diagnosis not present

## 2020-04-20 DIAGNOSIS — E1122 Type 2 diabetes mellitus with diabetic chronic kidney disease: Secondary | ICD-10-CM | POA: Diagnosis not present

## 2020-04-20 DIAGNOSIS — L89623 Pressure ulcer of left heel, stage 3: Secondary | ICD-10-CM | POA: Diagnosis not present

## 2020-04-20 DIAGNOSIS — I890 Lymphedema, not elsewhere classified: Secondary | ICD-10-CM | POA: Diagnosis not present

## 2020-04-20 DIAGNOSIS — R262 Difficulty in walking, not elsewhere classified: Secondary | ICD-10-CM | POA: Diagnosis not present

## 2020-04-20 DIAGNOSIS — N183 Chronic kidney disease, stage 3 unspecified: Secondary | ICD-10-CM | POA: Diagnosis not present

## 2020-04-20 DIAGNOSIS — I129 Hypertensive chronic kidney disease with stage 1 through stage 4 chronic kidney disease, or unspecified chronic kidney disease: Secondary | ICD-10-CM | POA: Diagnosis not present

## 2020-04-24 DIAGNOSIS — E1122 Type 2 diabetes mellitus with diabetic chronic kidney disease: Secondary | ICD-10-CM | POA: Diagnosis not present

## 2020-04-24 DIAGNOSIS — L89623 Pressure ulcer of left heel, stage 3: Secondary | ICD-10-CM | POA: Diagnosis not present

## 2020-04-24 DIAGNOSIS — I890 Lymphedema, not elsewhere classified: Secondary | ICD-10-CM | POA: Diagnosis not present

## 2020-04-24 DIAGNOSIS — L89893 Pressure ulcer of other site, stage 3: Secondary | ICD-10-CM | POA: Diagnosis not present

## 2020-04-24 DIAGNOSIS — N183 Chronic kidney disease, stage 3 unspecified: Secondary | ICD-10-CM | POA: Diagnosis not present

## 2020-04-24 DIAGNOSIS — M170 Bilateral primary osteoarthritis of knee: Secondary | ICD-10-CM | POA: Diagnosis not present

## 2020-04-24 DIAGNOSIS — R262 Difficulty in walking, not elsewhere classified: Secondary | ICD-10-CM | POA: Diagnosis not present

## 2020-04-24 DIAGNOSIS — I129 Hypertensive chronic kidney disease with stage 1 through stage 4 chronic kidney disease, or unspecified chronic kidney disease: Secondary | ICD-10-CM | POA: Diagnosis not present

## 2020-04-25 DIAGNOSIS — L89623 Pressure ulcer of left heel, stage 3: Secondary | ICD-10-CM | POA: Diagnosis not present

## 2020-04-25 DIAGNOSIS — I129 Hypertensive chronic kidney disease with stage 1 through stage 4 chronic kidney disease, or unspecified chronic kidney disease: Secondary | ICD-10-CM | POA: Diagnosis not present

## 2020-04-25 DIAGNOSIS — I890 Lymphedema, not elsewhere classified: Secondary | ICD-10-CM | POA: Diagnosis not present

## 2020-04-25 DIAGNOSIS — R262 Difficulty in walking, not elsewhere classified: Secondary | ICD-10-CM | POA: Diagnosis not present

## 2020-04-25 DIAGNOSIS — Z13228 Encounter for screening for other metabolic disorders: Secondary | ICD-10-CM | POA: Diagnosis not present

## 2020-04-25 DIAGNOSIS — N183 Chronic kidney disease, stage 3 unspecified: Secondary | ICD-10-CM | POA: Diagnosis not present

## 2020-04-25 DIAGNOSIS — M170 Bilateral primary osteoarthritis of knee: Secondary | ICD-10-CM | POA: Diagnosis not present

## 2020-04-25 DIAGNOSIS — L89893 Pressure ulcer of other site, stage 3: Secondary | ICD-10-CM | POA: Diagnosis not present

## 2020-04-25 DIAGNOSIS — R970 Elevated carcinoembryonic antigen [CEA]: Secondary | ICD-10-CM | POA: Diagnosis not present

## 2020-04-25 DIAGNOSIS — R946 Abnormal results of thyroid function studies: Secondary | ICD-10-CM | POA: Diagnosis not present

## 2020-04-25 DIAGNOSIS — E1122 Type 2 diabetes mellitus with diabetic chronic kidney disease: Secondary | ICD-10-CM | POA: Diagnosis not present

## 2020-04-27 ENCOUNTER — Ambulatory Visit (INDEPENDENT_AMBULATORY_CARE_PROVIDER_SITE_OTHER): Payer: Medicare Other

## 2020-04-27 DIAGNOSIS — E1122 Type 2 diabetes mellitus with diabetic chronic kidney disease: Secondary | ICD-10-CM

## 2020-04-27 DIAGNOSIS — N183 Chronic kidney disease, stage 3 unspecified: Secondary | ICD-10-CM

## 2020-04-27 NOTE — Patient Instructions (Signed)
Social Worker Visit Information  Goals we discussed today:  Goals Addressed            This Visit's Progress   . wotk with SW to manage care coordination needs       Timeframe:  Long-Range Goal Priority:  High Start Date:  2.1.22                          Expected End Date: 5.2.22                       Next planned outreach: 4.19.22  Patient Goals/Self-Care Activities Over the next 30 days, patient will:   - Patient will self administer medications as prescribed -Patient will attend all scheduled provider appointments -Patient will call provider office for new concerns or questions -Contact SW as needed prior to next scheduled call        Follow Up Plan: SW will follow up with patient by phone over the next week.   Bevelyn Ngo, BSW, CDP Social Worker, Certified Dementia Practitioner [facility] Care Management [phone]

## 2020-04-27 NOTE — Chronic Care Management (AMB) (Signed)
Chronic Care Management    Social Work Note  04/27/2020 Name: Mary [name] MRN: [id number] DOB: 09-16-1945  Mary [name] is a 75 y.o. year old female who is a primary care patient of Mary Chard, MD. The CCM team was consulted to assist the patient with chronic disease management and/or care coordination needs related to: [employer] .   Engaged with patient by telephone for follow up visit in response to provider referral for social work chronic care management and care coordination services.   Consent to Services:  The patient was given information about Chronic Care Management services, agreed to services, and gave verbal consent prior to initiation of services.  Please see initial visit note for detailed documentation.   Patient agreed to services and consent obtained.   Assessment: Review of patient past medical history, allergies, medications, and health status, including review of relevant consultants reports was performed today as part of a comprehensive evaluation and provision of chronic care management and care coordination services.     SDOH (Social Determinants of Health) assessments and interventions performed:    Advanced Directives Status: Not addressed in this encounter.  CCM Care Plan  No Known Allergies  Outpatient Encounter Medications as of 04/27/2020  Medication Sig Note  . aspirin 81 MG chewable tablet 1 tablet   . Assure Comfort Lancets 28G MISC    . Blood Glucose Monitoring Suppl (ONETOUCH VERIO FLEX SYSTEM) w/Device KIT Use as directed to check blood sugars 2 times per day dx: e11.65   . cetirizine (ZYRTEC) 10 MG tablet TAKE 1 TABLET BY MOUTH EVERY DAY   . Cyanocobalamin (VITAMIN B 12) 500 MCG TABS 1 tablet   . Dexchlorpheniramine Maleate (RYCLORA) 2 MG/5ML SOLN 5cc twice daily as needed   . diphenoxylate-atropine (LOMOTIL) 2.5-0.025 MG tablet Take by mouth.   . dorzolamide-timolol (COSOPT) 22.3-6.8 MG/ML ophthalmic solution Place 1 drop  into the right eye 2 (two) times daily.   . fluticasone (FLONASE) 50 MCG/ACT nasal spray SPRAY 1 SPRAY INTO EACH NOSTRIL EVERY DAY   . furosemide (LASIX) 40 MG tablet Take 1 tablet (40 mg total) by mouth daily.   [name] glucose blood (ONETOUCH VERIO) test strip Use as directed to check blood sugars 2 times per day dx: e11.65   . HYDROcodone-acetaminophen (NORCO) 10-325 MG tablet Take 1 tablet by mouth 5 (five) times daily as needed.   [name] KLOR-CON M20 20 MEQ tablet Take 20 mEq by mouth daily. with food 02/23/2018: Patient instructed to hold this medication due to abnormal Potassium level of 6.2 on 02/18/18, per MD, Dr. Glendale [name]. Patient was given verbal education related to complications from hypo and hyperkalemia and importance of following MD recommendations. Patient instructed to resume this medication when directed by PCP. Patient verbalizes understanding.   [name] levothyroxine (SYNTHROID) 125 MCG tablet Take 125 mcg by mouth daily before breakfast.   . magnesium oxide (MAG-OX) 400 MG tablet TAKE 1 TABLET BY MOUTH EVERY DAY IN THE EVENING   . metoprolol succinate (TOPROL-XL) 50 MG 24 hr tablet Take 1 tablet (50 mg total) by mouth daily.   [name] omeprazole (PRILOSEC) 20 MG capsule TAKE 1 CAPSULE BY MOUTH  DAILY   . ondansetron (ZOFRAN) 4 MG tablet Take by mouth.   [name] Delica Lancets 88K MISC Use as directed to check blood sugars 2 times per day dx: e11.65   . oxyCODONE-acetaminophen (PERCOCET) 7.5-325 MG tablet 1 tablet as needed   . pravastatin (PRAVACHOL) 40 MG tablet TAKE 1  TABLET BY MOUTH EVERY DAY   . predniSONE (DELTASONE) 10 MG tablet Take by mouth.    . sulfamethoxazole-trimethoprim (BACTRIM DS) 800-160 MG tablet Take 1 tablet by mouth 2 (two) times daily.   [name] telmisartan (MICARDIS) 80 MG tablet TAKE 1 TABLET BY MOUTH  DAILY   . triamcinolone (KENALOG) 0.1 % APPLY TO AFFECTED AREA TWICE A DAY    No facility-administered encounter medications on file as of 04/27/2020.    Patient Active  Problem List   Diagnosis Date Noted  . Change in bowel habit 01/20/2020  . Chronic anemia 01/20/2020  . Colon cancer screening 01/20/2020  . Diabetes mellitus with stage 3 chronic kidney disease ([place]) 02/21/2018  . 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 Primary osteoarthritis of both knees 02/21/2018  . Class 3 severe obesity due to excess calories with serious comorbidity and body mass index (BMI) of 45.0 to 49.9 in adult ([place]) 02/21/2018  . Skin ulcer of right foot with fat layer exposed ([place]) 02/18/2018  . Morbid (severe) obesity due to excess calories ([place]) 11/18/2017  . Knee pain 07/16/2015  . Lateral dislocation of right patella 05/04/2015  . Rupture of right quadriceps tendon 05/24/2014  . S/P TKR (total knee replacement) 05/05/2012    Conditions to be addressed/monitored: DMII and CKD Stage III; Transportation and Limited access to caregiver  Care Plan : Social Work Care Plan  Updates made by [name] since 04/27/2020 12:00 AM    Problem: Care Coordination     Long-Range Goal: Identify resources to assist with care coordination needs   Start Date: 02/15/2020  Expected End Date: 05/15/2020  Recent Progress: On track  Priority: High  Note:   Current Barriers:  . Limited social support . Transportation . ADL IADL limitations . Chronic conditions including DM II, CKD III, and obesity which put patient at increased risk for hospitalization   Social Work Clinical Goal(s):  [name] Over the next 90 days, patient will work with SW to address concerns related to care coordination  Interventions: . 1:1 collaboration with Mary Chard, MD regarding development and update of comprehensive plan of care as evidenced by provider attestation and co-signature . Inter-disciplinary care team collaboration (see longitudinal plan of care) . Successful outbound call placed to the patient to assess goal progression . Determined the patient continues to work with home health through  [place] . Patient reports she has received her electric wheel chair but is not currently using it due to swelling in her legs - patient reports she continues to use lymphedema pumps in the home . Discussed the patient remains active with [facility] - patient inquires if [facility] can administer injections to her knees to help control pain . Advised the patient SW would contact [facility] to inquire if this service is available . Unsuccessful outbound call placed to Reche Dixon- voice message left requesting a return call  Patient Goals/Self-Care Activities Over the next 30 days, patient will:   - Patient will self administer medications as prescribed Patient will attend all scheduled provider appointments Patient will call provider office for new concerns or questions Contact SW as needed prior to next scheduled call  Follow up Plan: SW will follow up with patient by phone over the next week       Follow Up Plan: SW will follow up with patient by phone over the next week      [name], BSW, CDP Social Worker, Certified Dementia Practitioner [place] [facility] [place] [facility] [phone]  Total time spent performing care coordination and/or care management activities with the patient by phone or face to face = 25 minutes.

## 2020-04-28 DIAGNOSIS — R262 Difficulty in walking, not elsewhere classified: Secondary | ICD-10-CM | POA: Diagnosis not present

## 2020-04-28 DIAGNOSIS — L89623 Pressure ulcer of left heel, stage 3: Secondary | ICD-10-CM | POA: Diagnosis not present

## 2020-04-28 DIAGNOSIS — M170 Bilateral primary osteoarthritis of knee: Secondary | ICD-10-CM | POA: Diagnosis not present

## 2020-04-28 DIAGNOSIS — L89893 Pressure ulcer of other site, stage 3: Secondary | ICD-10-CM | POA: Diagnosis not present

## 2020-04-28 DIAGNOSIS — E1122 Type 2 diabetes mellitus with diabetic chronic kidney disease: Secondary | ICD-10-CM | POA: Diagnosis not present

## 2020-04-28 DIAGNOSIS — I129 Hypertensive chronic kidney disease with stage 1 through stage 4 chronic kidney disease, or unspecified chronic kidney disease: Secondary | ICD-10-CM | POA: Diagnosis not present

## 2020-04-28 DIAGNOSIS — N183 Chronic kidney disease, stage 3 unspecified: Secondary | ICD-10-CM | POA: Diagnosis not present

## 2020-04-28 DIAGNOSIS — I890 Lymphedema, not elsewhere classified: Secondary | ICD-10-CM | POA: Diagnosis not present

## 2020-05-02 ENCOUNTER — Ambulatory Visit: Payer: Medicare Other

## 2020-05-02 DIAGNOSIS — N183 Chronic kidney disease, stage 3 unspecified: Secondary | ICD-10-CM

## 2020-05-02 DIAGNOSIS — M170 Bilateral primary osteoarthritis of knee: Secondary | ICD-10-CM | POA: Diagnosis not present

## 2020-05-02 DIAGNOSIS — R262 Difficulty in walking, not elsewhere classified: Secondary | ICD-10-CM | POA: Diagnosis not present

## 2020-05-02 DIAGNOSIS — I890 Lymphedema, not elsewhere classified: Secondary | ICD-10-CM | POA: Diagnosis not present

## 2020-05-02 DIAGNOSIS — L89623 Pressure ulcer of left heel, stage 3: Secondary | ICD-10-CM | POA: Diagnosis not present

## 2020-05-02 DIAGNOSIS — I129 Hypertensive chronic kidney disease with stage 1 through stage 4 chronic kidney disease, or unspecified chronic kidney disease: Secondary | ICD-10-CM | POA: Diagnosis not present

## 2020-05-02 DIAGNOSIS — E1122 Type 2 diabetes mellitus with diabetic chronic kidney disease: Secondary | ICD-10-CM | POA: Diagnosis not present

## 2020-05-02 DIAGNOSIS — L89893 Pressure ulcer of other site, stage 3: Secondary | ICD-10-CM | POA: Diagnosis not present

## 2020-05-02 NOTE — Chronic Care Management (AMB) (Signed)
Chronic Care Management    Social Work Note  05/02/2020 Name: Mary [name] MRN: [id number] DOB: [birth year]  Mary [name] is a 75 y.o. year old female who is a primary care patient of Glendale Chard, MD. The CCM team was consulted to assist the patient with chronic disease management and/or care coordination needs related to: [employer] .   Engaged with patient by telephone for follow up visit in response to provider referral for social work chronic care management and care coordination services.   Consent to Services:  The patient was given information about Chronic Care Management services, agreed to services, and gave verbal consent prior to initiation of services.  Please see initial visit note for detailed documentation.   Patient agreed to services and consent obtained.   Assessment: Review of patient past medical history, allergies, medications, and health status, including review of relevant consultants reports was performed today as part of a comprehensive evaluation and provision of chronic care management and care coordination services.     SDOH (Social Determinants of Health) assessments and interventions performed:    Advanced Directives Status: Not addressed in this encounter.  CCM Care Plan  No Known Allergies  Outpatient Encounter Medications as of 05/02/2020  Medication Sig Note  . aspirin 81 MG chewable tablet 1 tablet   . Assure Comfort Lancets 28G MISC    . Blood Glucose Monitoring Suppl (ONETOUCH VERIO FLEX SYSTEM) w/Device KIT Use as directed to check blood sugars 2 times per day dx: e11.65   . cetirizine (ZYRTEC) 10 MG tablet TAKE 1 TABLET BY MOUTH EVERY DAY   . Cyanocobalamin (VITAMIN B 12) 500 MCG TABS 1 tablet   . Dexchlorpheniramine Maleate (RYCLORA) 2 MG/5ML SOLN 5cc twice daily as needed   . diphenoxylate-atropine (LOMOTIL) 2.5-0.025 MG tablet Take by mouth.   . dorzolamide-timolol (COSOPT) 22.3-6.8 MG/ML ophthalmic solution Place 1 drop  into the right eye 2 (two) times daily.   . fluticasone (FLONASE) 50 MCG/ACT nasal spray SPRAY 1 SPRAY INTO EACH NOSTRIL EVERY DAY   . furosemide (LASIX) 40 MG tablet Take 1 tablet (40 mg total) by mouth daily.   [name] glucose blood (ONETOUCH VERIO) test strip Use as directed to check blood sugars 2 times per day dx: e11.65   . HYDROcodone-acetaminophen (NORCO) 10-325 MG tablet Take 1 tablet by mouth 5 (five) times daily as needed.   [name] KLOR-CON M20 20 MEQ tablet Take 20 mEq by mouth daily. with food 02/23/2018: Patient instructed to hold this medication due to abnormal Potassium level of 6.2 on 02/18/18, per MD, Dr. Glendale Chard. Patient was given verbal education related to complications from hypo and hyperkalemia and importance of following MD recommendations. Patient instructed to resume this medication when directed by PCP. Patient verbalizes understanding.   [name] levothyroxine (SYNTHROID) 125 MCG tablet Take 125 mcg by mouth daily before breakfast.   . magnesium oxide (MAG-OX) 400 MG tablet TAKE 1 TABLET BY MOUTH EVERY DAY IN THE EVENING   . metoprolol succinate (TOPROL-XL) 50 MG 24 hr tablet Take 1 tablet (50 mg total) by mouth daily.   [name] omeprazole (PRILOSEC) 20 MG capsule TAKE 1 CAPSULE BY MOUTH  DAILY   . ondansetron (ZOFRAN) 4 MG tablet Take by mouth.   [name] Delica Lancets 12I MISC Use as directed to check blood sugars 2 times per day dx: e11.65   . oxyCODONE-acetaminophen (PERCOCET) 7.5-325 MG tablet 1 tablet as needed   . pravastatin (PRAVACHOL) 40 MG tablet TAKE 1  TABLET BY MOUTH EVERY DAY   . predniSONE (DELTASONE) 10 MG tablet Take by mouth.    . sulfamethoxazole-trimethoprim (BACTRIM DS) 800-160 MG tablet Take 1 tablet by mouth 2 (two) times daily.   [name] telmisartan (MICARDIS) 80 MG tablet TAKE 1 TABLET BY MOUTH  DAILY   . triamcinolone (KENALOG) 0.1 % APPLY TO AFFECTED AREA TWICE A DAY    No facility-administered encounter medications on file as of 05/02/2020.    Patient Active  Problem List   Diagnosis Date Noted  . Change in bowel habit 01/20/2020  . Chronic anemia 01/20/2020  . Colon cancer screening 01/20/2020  . Diabetes mellitus with stage 3 chronic kidney disease ([place]) 02/21/2018  . 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 Primary osteoarthritis of both knees 02/21/2018  . Class 3 severe obesity due to excess calories with serious comorbidity and body mass index (BMI) of 45.0 to 49.9 in adult ([place]) 02/21/2018  . Skin ulcer of right foot with fat layer exposed ([place]) 02/18/2018  . Morbid (severe) obesity due to excess calories ([place]) 11/18/2017  . Knee pain 07/16/2015  . Lateral dislocation of right patella 05/04/2015  . Rupture of right quadriceps tendon 05/24/2014  . S/P TKR (total knee replacement) 05/05/2012    Conditions to be addressed/monitored: DMII and CKD Stage III; Transportation and Limited access to caregiver  Care Plan : Social Work Care Plan  Updates made by Mary [name] since 05/02/2020 12:00 AM    Problem: Care Coordination     Long-Range Goal: Identify resources to assist with care coordination needs   Start Date: 02/15/2020  Expected End Date: 05/15/2020  Recent Progress: On track  Priority: High  Note:   Current Barriers:  . Limited social support . Transportation . ADL IADL limitations . Chronic conditions including DM II, CKD III, and obesity which put patient at increased risk for hospitalization   Social Work Clinical Goal(s):  [name] Over the next 90 days, patient will work with SW to address concerns related to care coordination  Interventions: . 1:1 collaboration with Glendale Chard, MD regarding development and update of comprehensive plan of care as evidenced by provider attestation and co-signature . Inter-disciplinary care team collaboration (see longitudinal plan of care) . Successful outbound call placed to Reche Dixon with [facility]  . Determined [facility] does not offer injections in the home . Discussed  the patients [facility] RN, Lattie Haw visited the patient in the home and discussed the patient would need to be seen by an Orthopedic Physician regarding knee pain . Successful outbound call placed to the patient who reports she is aware she will need to see an Orthopedic physician but is unable to complete a visit at this time due to needing a referral . Attempted to scheduled the patient with her primary care provider for a referral . Determined the patients electric w/c is broken and she is awaiting Josh with home health to fix the problem . Discussed the patient will contact her primary providers office to schedule an appointment once her wheelchair has been repaired  Patient Goals/Self-Care Activities Over the next 30 days, patient will:   - Patient will self administer medications as prescribed Patient will attend all scheduled provider appointments Patient will call provider office for new concerns or questions Contact SW as needed prior to next scheduled call  Follow up Plan: SW will follow up with patient by phone over the next 30 days       Follow Up Plan: SW will  follow up with patient by phone over the next 30 days      Mary [name], BSW, CDP Social Worker, Certified Dementia Practitioner [place] [facility] [facility] [phone]  Total time spent performing care coordination and/or care management activities with the patient by phone or face to face = 22 minutes.

## 2020-05-02 NOTE — Patient Instructions (Signed)
Social Worker Visit Information  Goals we discussed today:  Goals Addressed            This Visit's Progress   . wotk with SW to manage care coordination needs       Timeframe:  Long-Range Goal Priority:  High Start Date:  2.1.22                          Expected End Date: 5.2.22                       Next planned outreach: 5.11.22  Patient Goals/Self-Care Activities Over the next 30 days, patient will:   - Patient will self administer medications as prescribed -Patient will attend all scheduled provider appointments -Patient will call provider office for new concerns or questions -Contact SW as needed prior to next scheduled call         Follow Up Plan: SW will follow up with patient by phone over the next 30 days   Bevelyn Ngo, BSW, CDP Social Worker, Certified Dementia Practitioner [facility] [phone]

## 2020-05-03 ENCOUNTER — Ambulatory Visit: Payer: Self-pay

## 2020-05-03 ENCOUNTER — Telehealth: Payer: Medicare Other

## 2020-05-03 DIAGNOSIS — E039 Hypothyroidism, unspecified: Secondary | ICD-10-CM | POA: Diagnosis not present

## 2020-05-03 DIAGNOSIS — I129 Hypertensive chronic kidney disease with stage 1 through stage 4 chronic kidney disease, or unspecified chronic kidney disease: Secondary | ICD-10-CM

## 2020-05-03 DIAGNOSIS — E1122 Type 2 diabetes mellitus with diabetic chronic kidney disease: Secondary | ICD-10-CM

## 2020-05-03 DIAGNOSIS — R262 Difficulty in walking, not elsewhere classified: Secondary | ICD-10-CM | POA: Diagnosis not present

## 2020-05-03 DIAGNOSIS — E7800 Pure hypercholesterolemia, unspecified: Secondary | ICD-10-CM | POA: Diagnosis not present

## 2020-05-03 DIAGNOSIS — N183 Chronic kidney disease, stage 3 unspecified: Secondary | ICD-10-CM

## 2020-05-03 DIAGNOSIS — L89623 Pressure ulcer of left heel, stage 3: Secondary | ICD-10-CM | POA: Diagnosis not present

## 2020-05-03 DIAGNOSIS — I890 Lymphedema, not elsewhere classified: Secondary | ICD-10-CM | POA: Diagnosis not present

## 2020-05-03 DIAGNOSIS — R600 Localized edema: Secondary | ICD-10-CM

## 2020-05-03 DIAGNOSIS — M170 Bilateral primary osteoarthritis of knee: Secondary | ICD-10-CM | POA: Diagnosis not present

## 2020-05-03 DIAGNOSIS — L89893 Pressure ulcer of other site, stage 3: Secondary | ICD-10-CM | POA: Diagnosis not present

## 2020-05-05 ENCOUNTER — Encounter (INDEPENDENT_AMBULATORY_CARE_PROVIDER_SITE_OTHER): Payer: Medicare Other | Admitting: Ophthalmology

## 2020-05-05 DIAGNOSIS — E890 Postprocedural hypothyroidism: Secondary | ICD-10-CM | POA: Diagnosis not present

## 2020-05-05 DIAGNOSIS — L89893 Pressure ulcer of other site, stage 3: Secondary | ICD-10-CM | POA: Diagnosis not present

## 2020-05-05 DIAGNOSIS — E119 Type 2 diabetes mellitus without complications: Secondary | ICD-10-CM | POA: Diagnosis not present

## 2020-05-05 DIAGNOSIS — I10 Essential (primary) hypertension: Secondary | ICD-10-CM | POA: Diagnosis not present

## 2020-05-05 DIAGNOSIS — L89623 Pressure ulcer of left heel, stage 3: Secondary | ICD-10-CM | POA: Diagnosis not present

## 2020-05-08 DIAGNOSIS — I890 Lymphedema, not elsewhere classified: Secondary | ICD-10-CM | POA: Diagnosis not present

## 2020-05-09 DIAGNOSIS — R262 Difficulty in walking, not elsewhere classified: Secondary | ICD-10-CM | POA: Diagnosis not present

## 2020-05-09 DIAGNOSIS — N183 Chronic kidney disease, stage 3 unspecified: Secondary | ICD-10-CM | POA: Diagnosis not present

## 2020-05-09 DIAGNOSIS — L89623 Pressure ulcer of left heel, stage 3: Secondary | ICD-10-CM | POA: Diagnosis not present

## 2020-05-09 DIAGNOSIS — I129 Hypertensive chronic kidney disease with stage 1 through stage 4 chronic kidney disease, or unspecified chronic kidney disease: Secondary | ICD-10-CM | POA: Diagnosis not present

## 2020-05-09 DIAGNOSIS — I890 Lymphedema, not elsewhere classified: Secondary | ICD-10-CM | POA: Diagnosis not present

## 2020-05-09 DIAGNOSIS — E1122 Type 2 diabetes mellitus with diabetic chronic kidney disease: Secondary | ICD-10-CM | POA: Diagnosis not present

## 2020-05-09 DIAGNOSIS — L89893 Pressure ulcer of other site, stage 3: Secondary | ICD-10-CM | POA: Diagnosis not present

## 2020-05-09 DIAGNOSIS — M170 Bilateral primary osteoarthritis of knee: Secondary | ICD-10-CM | POA: Diagnosis not present

## 2020-05-09 NOTE — Progress Notes (Shared)
[facility] Note  05/12/2020     CHIEF COMPLAINT Patient presents for No chief complaint on file.   HISTORY OF PRESENT ILLNESS: Mary [name] is a 75 y.o. female who presents to the clinic today for:   pt    Referring physician: Glendale Chard, [address] [place],  [place] [id number]  HISTORICAL INFORMATION:   Selected notes from the [facility] Transferred care from Dr. Zigmund Daniel   CURRENT MEDICATIONS: Current Outpatient Medications (Ophthalmic Drugs)  Medication Sig  . dorzolamide-timolol (COSOPT) 22.3-6.8 MG/ML ophthalmic solution Place 1 drop into the right eye 2 (two) times daily.   No current facility-administered medications for this visit. (Ophthalmic Drugs)   Current Outpatient Medications (Other)  Medication Sig  . aspirin 81 MG chewable tablet 1 tablet  . Assure Comfort Lancets 28G MISC   . Blood Glucose Monitoring Suppl (ONETOUCH VERIO FLEX SYSTEM) w/Device KIT Use as directed to check blood sugars 2 times per day dx: e11.65  . cetirizine (ZYRTEC) 10 MG tablet TAKE 1 TABLET BY MOUTH EVERY DAY  . Cyanocobalamin (VITAMIN B 12) 500 MCG TABS 1 tablet  . Dexchlorpheniramine Maleate (RYCLORA) 2 MG/5ML SOLN 5cc twice daily as needed  . diphenoxylate-atropine (LOMOTIL) 2.5-0.025 MG tablet Take by mouth.  . fluticasone (FLONASE) 50 MCG/ACT nasal spray SPRAY 1 SPRAY INTO EACH NOSTRIL EVERY DAY  . furosemide (LASIX) 40 MG tablet Take 1 tablet (40 mg total) by mouth daily.  [name] glucose blood (ONETOUCH VERIO) test strip Use as directed to check blood sugars 2 times per day dx: e11.65  . HYDROcodone-acetaminophen (NORCO) 10-325 MG tablet Take 1 tablet by mouth 5 (five) times daily as needed.  [name] KLOR-CON M20 20 MEQ tablet Take 20 mEq by mouth daily. with food  . levothyroxine (SYNTHROID) 125 MCG tablet Take 125 mcg by mouth daily before breakfast.  . magnesium oxide (MAG-OX) 400 MG tablet TAKE 1 TABLET BY MOUTH EVERY DAY  IN THE EVENING  . metoprolol succinate (TOPROL-XL) 50 MG 24 hr tablet Take 1 tablet (50 mg total) by mouth daily.  [name] omeprazole (PRILOSEC) 20 MG capsule TAKE 1 CAPSULE BY MOUTH  DAILY  . ondansetron (ZOFRAN) 4 MG tablet Take by mouth.  [name] Delica Lancets 86P MISC Use as directed to check blood sugars 2 times per day dx: e11.65  . oxyCODONE-acetaminophen (PERCOCET) 7.5-325 MG tablet 1 tablet as needed  . pravastatin (PRAVACHOL) 40 MG tablet TAKE 1 TABLET BY MOUTH EVERY DAY  . predniSONE (DELTASONE) 10 MG tablet Take by mouth.   . sulfamethoxazole-trimethoprim (BACTRIM DS) 800-160 MG tablet Take 1 tablet by mouth 2 (two) times daily.  [name] telmisartan (MICARDIS) 80 MG tablet TAKE 1 TABLET BY MOUTH  DAILY  . triamcinolone (KENALOG) 0.1 % APPLY TO AFFECTED AREA TWICE A DAY   No current facility-administered medications for this visit. (Other)      REVIEW OF SYSTEMS:    ALLERGIES No Known Allergies  PAST MEDICAL HISTORY Past Medical History:  Diagnosis Date  . Arthritis    osteoarthritis  . Cataract    OS  . Diabetes mellitus    Insulin dependent  . Diabetic retinopathy ([place])    NPDR OU  . Glaucoma    POAG OD  . H/O cardiovascular stress test    pt. reports 10/13- was normal per pt.   . Hyperlipemia   . Hypertension   . Hypertensive retinopathy    OU   Past Surgical  History:  Procedure Laterality Date  . CARPAL TUNNEL RELEASE Bilateral   . CATARACT EXTRACTION Right 10/05/2018   Dr. Kathlen Mody  . EYE SURGERY Right    Cat Sx  . KNEE ARTHROSCOPY     right  . QUADRICEPS TENDON REPAIR  02/17/2012   Procedure: REPAIR QUADRICEP TENDON;  Surgeon: Vickey Huger, MD;  Location: [facility];  Service: Orthopedics;  Laterality: Right;  right quadriceps repair with latera release  . QUADRICEPS TENDON REPAIR Right 05/15/2012   Procedure: REPAIR QUADRICEP TENDON;  Surgeon: Vickey Huger, MD;  Location: [facility];  Service: Orthopedics;  Laterality: Right;  . TOTAL KNEE ARTHROPLASTY  10/21/2011    rt tk  . TOTAL KNEE ARTHROPLASTY  10/21/2011   Procedure: TOTAL KNEE ARTHROPLASTY;  Surgeon: Rudean Haskell, MD;  Location: [facility];  Service: Orthopedics;  Laterality: Right;  . TUBAL LIGATION      FAMILY HISTORY Family History  Problem Relation Age of Onset  . Hypertension Mother   . Heart Problems Father   . Gout Father   . Arthritis Father     SOCIAL HISTORY Social History   Tobacco Use  . Smoking status: Former Smoker    Packs/day: 1.00    Years: 30.00    Pack years: 30.00    Types: Cigarettes    Start date: 01/15/1967    Quit date: 06/28/2014    Years since quitting: 5.8  . Smokeless tobacco: Never Used  Vaping Use  . Vaping Use: Never used  Substance Use Topics  . Alcohol use: No  . Drug use: Yes    Types: Hydrocodone         OPHTHALMIC EXAM:  Not recorded     IMAGING AND PROCEDURES  Imaging and Procedures for _0 @           ASSESSMENT/PLAN:    ICD-10-CM   1. Central retinal vein occlusion with macular edema of both eyes  H34.8130   2. Moderate nonproliferative diabetic retinopathy of both eyes with macular edema associated with type 2 diabetes mellitus ([place])  Z61.0960   3. Retinal edema  H35.81   4. Essential hypertension  I10   5. Hypertensive retinopathy of both eyes  H35.033   6. Combined forms of age-related cataract of left eye  H25.812   7. Pseudophakia  Z96.1   8. Primary open angle glaucoma (POAG) of right eye, mild stage  H40.1111    1. CRVO w/ CME OU  - pt of Dr. Zigmund Daniel who wished to transfer care  - OS long-standing low vision (HM) with subfoveal scar and central retinal atrophy  - OD with CME actively being managed with Ozurdex OD ~q5 wks by Dr. Zigmund Daniel (last on 8.20.20) s/p multiple IVTA and Ozurdex OD  - was due for Ozurdex OD w/ Zigmund Daniel on 9.25.20, but underwent cataract surgery OD w/ Kathlen Mody on 9.21.20  - s/p IVTA OD #4 (10.26.20), #5 (12.10.20), #6 (01.21.21), #7 (03.05.21), #8 (04.16.21), #9 (05.28.21), #10 (07.09.21),  #11 (09.14.21), #12 (10.29.21), #13 (12.17.21), #14 (01.27.22),#15 (03.11.22)  - BCVA stable at 20/50   - exam shows no residual IVTA in vitreous cavity  - OCT shows interval decrease in IRF ST macula and fovea at 6 wks  - recommend IVTA OD #16 today, 04.29.22  - pt wishes to proceed  - RBA of procedure discussed, questions answered  - informed consent obtained  - see procedure note -- AC tap  - f/u in 6 wks -- DFE/OCT/possible injection  2,3. Severe Non-proliferative diabetic  retinopathy OU.  - OCT shows diabetic macular edema, OD   - recommend IVTA OD today, 03.11.22 as above for CRVO  4,5. Hypertensive retinopathy OU  - discussed importance of tight BP control  - monitor  6. Mixed form age related cataract OS  - under the expert management of Dr. Kathlen Mody  7. Pseudophakia OD  - s/p CE/IOL OD (9.21.20) by expert surgeon, Dr. Kathlen Mody  - IOL in excellent position  - had post op IOP spike -- IOP now under better control  - monitor  8. POAG OD  - under the expert management of Dr. Kathlen Mody  - s/p SLT and goniotomy OD  - had post-cataract IOP spike  - IOP 14 today  - AC tap post IVTA injxn  Ophthalmic Meds Ordered this visit:  No orders of the defined types were placed in this encounter.      No follow-ups on file.  There are no Patient Instructions on file for this visit.   Explained the diagnoses, plan, and follow up with the patient and they expressed understanding.  Patient expressed understanding of the importance of proper follow up care.   This document serves as a record of services personally performed by Gardiner Sleeper, MD, PhD. It was created on their behalf by San Jetty. Owens Shark, OA an ophthalmic technician. The creation of this record is the provider's dictation and/or activities during the visit.    Electronically signed by: San Jetty. Owens Shark, [place] 04.26.2022 7:53 AM  Gardiner Sleeper, M.D., Ph.D. Diseases & Surgery of the Retina and Vitreous [facility]    Abbreviations: M myopia (nearsighted); A astigmatism; H hyperopia (farsighted); P presbyopia; Mrx spectacle prescription;  CTL contact lenses; OD right eye; OS left eye; OU both eyes  XT exotropia; ET esotropia; PEK punctate epithelial keratitis; PEE punctate epithelial erosions; DES dry eye syndrome; MGD meibomian gland dysfunction; ATs artificial tears; PFAT's preservative free artificial tears; [place] nuclear sclerotic cataract; PSC posterior subcapsular cataract; ERM epi-retinal membrane; PVD posterior vitreous detachment; RD retinal detachment; DM diabetes mellitus; DR diabetic retinopathy; NPDR non-proliferative diabetic retinopathy; PDR proliferative diabetic retinopathy; CSME clinically significant macular edema; DME diabetic macular edema; dbh dot blot hemorrhages; CWS cotton wool spot; POAG primary open angle glaucoma; C/D cup-to-disc ratio; HVF humphrey visual field; GVF goldmann visual field; OCT optical coherence tomography; IOP intraocular pressure; BRVO Branch retinal vein occlusion; CRVO central retinal vein occlusion; CRAO central retinal artery occlusion; BRAO branch retinal artery occlusion; RT retinal tear; SB scleral buckle; PPV pars plana vitrectomy; VH Vitreous hemorrhage; PRP panretinal laser photocoagulation; IVK intravitreal kenalog; VMT vitreomacular traction; MH Macular hole;  NVD neovascularization of the disc; NVE neovascularization elsewhere; AREDS age related eye disease study; ARMD age related macular degeneration; POAG primary open angle glaucoma; EBMD epithelial/anterior basement membrane dystrophy; ACIOL anterior chamber intraocular lens; IOL intraocular lens; PCIOL posterior chamber intraocular lens; Phaco/IOL phacoemulsification with intraocular lens placement; [place] photorefractive keratectomy; LASIK laser assisted in situ keratomileusis; HTN hypertension; DM diabetes mellitus; COPD chronic obstructive pulmonary disease

## 2020-05-11 DIAGNOSIS — M170 Bilateral primary osteoarthritis of knee: Secondary | ICD-10-CM | POA: Diagnosis not present

## 2020-05-11 DIAGNOSIS — L89623 Pressure ulcer of left heel, stage 3: Secondary | ICD-10-CM | POA: Diagnosis not present

## 2020-05-11 DIAGNOSIS — L89893 Pressure ulcer of other site, stage 3: Secondary | ICD-10-CM | POA: Diagnosis not present

## 2020-05-11 DIAGNOSIS — N183 Chronic kidney disease, stage 3 unspecified: Secondary | ICD-10-CM | POA: Diagnosis not present

## 2020-05-11 DIAGNOSIS — I890 Lymphedema, not elsewhere classified: Secondary | ICD-10-CM | POA: Diagnosis not present

## 2020-05-11 DIAGNOSIS — I129 Hypertensive chronic kidney disease with stage 1 through stage 4 chronic kidney disease, or unspecified chronic kidney disease: Secondary | ICD-10-CM | POA: Diagnosis not present

## 2020-05-11 DIAGNOSIS — E1122 Type 2 diabetes mellitus with diabetic chronic kidney disease: Secondary | ICD-10-CM | POA: Diagnosis not present

## 2020-05-11 DIAGNOSIS — R262 Difficulty in walking, not elsewhere classified: Secondary | ICD-10-CM | POA: Diagnosis not present

## 2020-05-12 ENCOUNTER — Encounter (INDEPENDENT_AMBULATORY_CARE_PROVIDER_SITE_OTHER): Payer: Medicare Other | Admitting: Ophthalmology

## 2020-05-12 DIAGNOSIS — L89623 Pressure ulcer of left heel, stage 3: Secondary | ICD-10-CM | POA: Diagnosis not present

## 2020-05-12 DIAGNOSIS — R262 Difficulty in walking, not elsewhere classified: Secondary | ICD-10-CM | POA: Diagnosis not present

## 2020-05-12 DIAGNOSIS — E113313 Type 2 diabetes mellitus with moderate nonproliferative diabetic retinopathy with macular edema, bilateral: Secondary | ICD-10-CM

## 2020-05-12 DIAGNOSIS — H3581 Retinal edema: Secondary | ICD-10-CM

## 2020-05-12 DIAGNOSIS — I890 Lymphedema, not elsewhere classified: Secondary | ICD-10-CM | POA: Diagnosis not present

## 2020-05-12 DIAGNOSIS — H35033 Hypertensive retinopathy, bilateral: Secondary | ICD-10-CM

## 2020-05-12 DIAGNOSIS — M170 Bilateral primary osteoarthritis of knee: Secondary | ICD-10-CM | POA: Diagnosis not present

## 2020-05-12 DIAGNOSIS — H401111 Primary open-angle glaucoma, right eye, mild stage: Secondary | ICD-10-CM

## 2020-05-12 DIAGNOSIS — Z961 Presence of intraocular lens: Secondary | ICD-10-CM

## 2020-05-12 DIAGNOSIS — L89893 Pressure ulcer of other site, stage 3: Secondary | ICD-10-CM | POA: Diagnosis not present

## 2020-05-12 DIAGNOSIS — N183 Chronic kidney disease, stage 3 unspecified: Secondary | ICD-10-CM | POA: Diagnosis not present

## 2020-05-12 DIAGNOSIS — H25812 Combined forms of age-related cataract, left eye: Secondary | ICD-10-CM

## 2020-05-12 DIAGNOSIS — E1122 Type 2 diabetes mellitus with diabetic chronic kidney disease: Secondary | ICD-10-CM | POA: Diagnosis not present

## 2020-05-12 DIAGNOSIS — I129 Hypertensive chronic kidney disease with stage 1 through stage 4 chronic kidney disease, or unspecified chronic kidney disease: Secondary | ICD-10-CM | POA: Diagnosis not present

## 2020-05-12 DIAGNOSIS — H34813 Central retinal vein occlusion, bilateral, with macular edema: Secondary | ICD-10-CM

## 2020-05-12 DIAGNOSIS — I10 Essential (primary) hypertension: Secondary | ICD-10-CM

## 2020-05-13 NOTE — Chronic Care Management (AMB) (Signed)
Chronic Care Management   CCM RN Visit Note  05/03/2020 Name: Mary [name] MRN: [id number] DOB: 07-10-1945  Subjective: Mary [name] is a 75 y.o. year old female who is a primary care patient of Glendale Chard, MD. The care management team was consulted for assistance with disease management and care coordination needs.    Engaged with patient by telephone for follow up visit in response to provider referral for case management and/or care coordination services.   Consent to Services:  The patient was given information about Chronic Care Management services, agreed to services, and gave verbal consent prior to initiation of services.  Please see initial visit note for detailed documentation.   Patient agreed to services and verbal consent obtained.   Assessment: Review of patient past medical history, allergies, medications, health status, including review of consultants reports, laboratory and other test data, was performed as part of comprehensive evaluation and provision of chronic care management services.   SDOH (Social Determinants of Health) assessments and interventions performed:  Yes, patient is active with [facility]  No Known Allergies  Outpatient Encounter Medications as of 05/03/2020  Medication Sig Note  . aspirin 81 MG chewable tablet 1 tablet   . Assure Comfort Lancets 28G MISC    . Blood Glucose Monitoring Suppl (ONETOUCH VERIO FLEX SYSTEM) w/Device KIT Use as directed to check blood sugars 2 times per day dx: e11.65   . cetirizine (ZYRTEC) 10 MG tablet TAKE 1 TABLET BY MOUTH EVERY DAY   . Cyanocobalamin (VITAMIN B 12) 500 MCG TABS 1 tablet   . Dexchlorpheniramine Maleate (RYCLORA) 2 MG/5ML SOLN 5cc twice daily as needed   . diphenoxylate-atropine (LOMOTIL) 2.5-0.025 MG tablet Take by mouth.   . dorzolamide-timolol (COSOPT) 22.3-6.8 MG/ML ophthalmic solution Place 1 drop into the right eye 2 (two) times daily.   . fluticasone (FLONASE) 50 MCG/ACT  nasal spray SPRAY 1 SPRAY INTO EACH NOSTRIL EVERY DAY   . furosemide (LASIX) 40 MG tablet Take 1 tablet (40 mg total) by mouth daily.   [name] glucose blood (ONETOUCH VERIO) test strip Use as directed to check blood sugars 2 times per day dx: e11.65   . HYDROcodone-acetaminophen (NORCO) 10-325 MG tablet Take 1 tablet by mouth 5 (five) times daily as needed.   [name] KLOR-CON M20 20 MEQ tablet Take 20 mEq by mouth daily. with food 02/23/2018: Patient instructed to hold this medication due to abnormal Potassium level of 6.2 on 02/18/18, per MD, Dr. Glendale Chard. Patient was given verbal education related to complications from hypo and hyperkalemia and importance of following MD recommendations. Patient instructed to resume this medication when directed by PCP. Patient verbalizes understanding.   [name] levothyroxine (SYNTHROID) 125 MCG tablet Take 125 mcg by mouth daily before breakfast.   . magnesium oxide (MAG-OX) 400 MG tablet TAKE 1 TABLET BY MOUTH EVERY DAY IN THE EVENING   . metoprolol succinate (TOPROL-XL) 50 MG 24 hr tablet Take 1 tablet (50 mg total) by mouth daily.   [name] omeprazole (PRILOSEC) 20 MG capsule TAKE 1 CAPSULE BY MOUTH  DAILY   . ondansetron (ZOFRAN) 4 MG tablet Take by mouth.   [name] Delica Lancets 01U MISC Use as directed to check blood sugars 2 times per day dx: e11.65   . oxyCODONE-acetaminophen (PERCOCET) 7.5-325 MG tablet 1 tablet as needed   . pravastatin (PRAVACHOL) 40 MG tablet TAKE 1 TABLET BY MOUTH EVERY DAY   . predniSONE (DELTASONE) 10 MG tablet Take  by mouth.    . sulfamethoxazole-trimethoprim (BACTRIM DS) 800-160 MG tablet Take 1 tablet by mouth 2 (two) times daily.   [name] telmisartan (MICARDIS) 80 MG tablet TAKE 1 TABLET BY MOUTH  DAILY   . triamcinolone (KENALOG) 0.1 % APPLY TO AFFECTED AREA TWICE A DAY    No facility-administered encounter medications on file as of 05/03/2020.    Patient Active Problem List   Diagnosis Date Noted  . Change in bowel habit 01/20/2020  .  Chronic anemia 01/20/2020  . Colon cancer screening 01/20/2020  . Diabetes mellitus with stage 3 chronic kidney disease ([place]) 02/21/2018  . 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 Primary osteoarthritis of both knees 02/21/2018  . Class 3 severe obesity due to excess calories with serious comorbidity and body mass index (BMI) of 45.0 to 49.9 in adult ([place]) 02/21/2018  . Skin ulcer of right foot with fat layer exposed ([place]) 02/18/2018  . Morbid (severe) obesity due to excess calories ([place]) 11/18/2017  . Knee pain 07/16/2015  . Lateral dislocation of right patella 05/04/2015  . Rupture of right quadriceps tendon 05/24/2014  . S/P TKR (total knee replacement) 05/05/2012    Conditions to be addressed/monitored:Diabetes Mellitus with stage 3 Chronic Kidney disease, Parenchymal renal hypertension, Class 3 severe obesity, Edema of left lower extremity, Primary Hypothyroidism   Care Plan : Impaired Physical Mobility  Updates made by Lynne Logan, RN since 05/13/2020 12:00 AM    Problem: Impaired Physical Mobility   Priority: High    Long-Range Goal: Impaired Physical Mobility   Start Date: 03/29/2020  Expected End Date: 09/29/2020  Recent Progress: On track  Priority: High  Note:   Current Barriers:   Ineffective Self Health Maintenance  Lacks social connections  Unable to perform ADLs independently  Unable to perform IADLs independently Clinical Goal(s):  [name] Collaboration with Glendale Chard, MD regarding development and update of comprehensive plan of care as evidenced by provider attestation and co-signature . Inter-disciplinary care team collaboration (see longitudinal plan of care)  patient will work with care management team to address care coordination and chronic disease management needs related to Disease Management  Educational Needs  Care Coordination  Medication Management and Education  Psychosocial Support   Interventions:   Evaluation of current  treatment plan related to  Diabetes Mellitus with stage 3 Chronic Kidney disease, Parenchymal renal hypertension, Class 3 severe obesity, Edema of left lower extremity, Primary Hypothyroidism , self-management and patient's adherence to plan as established by provider.  Collaboration with Glendale Chard, MD regarding development and update of comprehensive plan of care as evidenced by provider attestation       and co-signature  Inter-disciplinary care team collaboration (see longitudinal plan of care)  05/03/20 completed successful outbound call completed with patient   Determined patient has experienced at least 2 falls within her home over the past couple of months, she denies injury, called EMS to help her up to her chair/bed  Reviewed fall prevention tips and discussed any home safety concerns  Determined patient is working with in home PT through [facility] patient received her new power W/C, unfortunately, the chair is broken and needs a repair, patient contacted the vendor   Instructed patient to use her DME at all times to help prevent falls and preserve injury  Discussed plans with patient for ongoing care management follow up and provided patient with direct contact information for care management team Self Care Activities:  . Call PCP for  questions or concerns . Continue to take all medications exactly as prescribed  . Keep all scheduled follow up appointments  . Let the embedded BSW know about any transportation issues that may arise . Call Pharmacy for refills 7 days before running out of medication  . Follow fall prevention tips and use DME at all times as discussed  . Continue to work with in home PT and follow HEP Patient Goals: - to get power W/C repaired and have fewer falls   Follow Up Plan: Telephone follow up appointment with care management team member scheduled for: 08/02/20   Care Plan : Impaired Skin Intregrity  Updates made by Lynne Logan, RN since 05/13/2020 12:00 AM    Problem: Impaired Skin Integrity   Priority: High    Long-Range Goal: Impaired Skin Integrity   Start Date: 03/29/2020  Expected End Date: 09/29/2020  Recent Progress: On track  Priority: High  Note:   Current Barriers:   Ineffective Self Health Maintenance  Lacks social connections  Unable to perform ADLs independently  Unable to perform IADLs independently Clinical Goal(s):  [name] Collaboration with Glendale Chard, MD regarding development and update of comprehensive plan of care as evidenced by provider attestation and co-signature . Inter-disciplinary care team collaboration (see longitudinal plan of care)  patient will work with care management team to address care coordination and chronic disease management needs related to Disease Management  Educational Needs  Care Coordination  Medication Management and Education  Psychosocial Support   Interventions:   Evaluation of current treatment plan related to Diabetes Mellitus with stage 3 Chronic Kidney disease, Parenchymal renal hypertension, Class 3 severe obesity, Edema of left lower extremity, Primary Hypothyroidism, self-management and patient's adherence to plan as established by provider.  Collaboration with Glendale Chard, MD regarding development and update of comprehensive plan of care as evidenced by provider attestation       and co-signature  Inter-disciplinary care team collaboration (see longitudinal plan of care)  05/03/20 completed successful outbound call with patient   Determined patient is receiving [place] services from [facility] for in home PT and wound care to the wound on her left heel  Determined patient states her wound is healing by evidence the wound is getting smaller   Educated patient on importance of adhering to lymphedema leg pumps, to use as directed  Educated on skin care and skin breakdown prevention; importance of keeping lower extremities  elevated while resting   Discussed plans with patient for ongoing care management follow up and provided patient with direct contact information for care management team Self Care Activities:  . Call PCP for questions or concerns . Continue to take all medications exactly as prescribed  . Keep all scheduled follow up appointments  . Complete daily skin inspections and report early sign of skin breakdown promptly  . Keep skin clean, dry and intact; Follow skin breakdown prevention techniques as discussed  . Follow recommendations for wound care to left heel Patient Goals: - for wound to heal  Follow Up Plan: Telephone follow up appointment with care management team member scheduled for: 08/02/20   Care Plan : Chronic Kidney disease  Updates made by Lynne Logan, RN since 05/13/2020 12:00 AM    Problem: Chronic Kidney disease   Priority: High    Long-Range Goal: Chronic Kidney disease - maintain adequate kidney function   Start Date: 05/03/2020  Expected End Date: 11/02/2020  This Visit's Progress: On track  Priority: Medium  Note:  Current Barriers:   Ineffective Self Health Maintenance   Lacks social connections  Self Care deficit Clinical Goal(s):  [name] Collaboration with Glendale Chard, MD regarding development and update of comprehensive plan of care as evidenced by provider attestation and co-signature . Inter-disciplinary care team collaboration (see longitudinal plan of care)  patient will work with care management team to address care coordination and chronic disease management needs related to Disease Management  Educational Needs  Care Coordination  Medication Management and Education  Psychosocial Support   Interventions:   Evaluation of current treatment plan related to CKD Stage III , self-management and patient's adherence to plan as established by provider.  Collaboration with Glendale Chard, MD regarding development and update of comprehensive plan of  care as evidenced by provider attestation       and co-signature  Inter-disciplinary care team collaboration (see longitudinal plan of care) . Provided education to patient about basic disease process related to CKD . Review of patient status, including review of consultants reports, relevant laboratory and other test results, and medications completed. . Reviewed medications with patient and discussed importance of medication adherence . Educated on way to improve kidney function, increase water to 64 oz per day, keep A1c and BP under optimal control, eat a well balanced low Sodium diet  Discussed plans with patient for ongoing care management follow up and provided patient with direct contact information for care management team Self Care Activities:  . Continue to adhere to MD recommendations for CKD  . Continue to keep all scheduled follow up appointments . Take medications as directed  . Let your healthcare team know if you are unable to take your medications . Call your pharmacy for refills at least 7 days prior to running out of medication . Increase your water intake unless otherwise directed Patient Goals: - to maintain adequate kidney function   Follow Up Plan: Telephone follow up appointment with care management team member scheduled for: 08/02/20    Plan:Telephone follow up appointment with care management team member scheduled for:  08/02/20  Barb Merino, RN, BSN, CCM Care Management Coordinator [facility]  Direct Phone: [phone]

## 2020-05-13 NOTE — Patient Instructions (Signed)
Goals Addressed    Other   .  Chronic Kidney disease - maintain adequate kidney function   On track     Timeframe:  Long-Range Goal Priority:  Medium Start Date: 05/03/20                            Expected End Date: 11/02/20     Next Follow Up date: 08/02/20       Self Care Activities:  . Continue to adhere to MD recommendations for CKD  . Continue to keep all scheduled follow up appointments . Take medications as directed  . Let your healthcare team know if you are unable to take your medications . Call your pharmacy for refills at least 7 days prior to running out of medication . Increase your water intake unless otherwise directed Patient Goals: - to maintain adequate kidney function                   .  Fall prevention   On track     Timeframe:  Long-Range Goal Priority:  High Start Date: 03/29/20                            Expected End Date: 09/29/20  Next Follow Up date: 08/02/20  Patient Goals/Self Care Activities:   . Call PCP for questions or concerns . Continue to take all medications exactly as prescribed  . Keep all scheduled follow up appointments  . Let the embedded BSW know about any transportation issues that may arise . Call Pharmacy for refills 7 days before running out of medication  . Follow fall prevention tips and use DME at all times as discussed  . Continue to work with in home PT and follow HEP                         .  Skin Integrity maintained or improved   On track     Timeframe:  Long-Range Goal Priority:  High Start Date: 03/29/20                            Expected End Date: 09/29/20  Next Follow Up date: 08/02/20  Patient Goals/Self Care Activities:  . Call PCP for questions or concerns . Continue to take all medications exactly as prescribed  . Keep all scheduled follow up appointments  . Complete daily skin inspections and report early sign of skin breakdown promptly  . Keep skin clean, dry and intact; Follow skin breakdown prevention  techniques as discussed  . Follow recommendations for wound care to left heel

## 2020-05-15 DIAGNOSIS — M170 Bilateral primary osteoarthritis of knee: Secondary | ICD-10-CM | POA: Diagnosis not present

## 2020-05-15 DIAGNOSIS — I129 Hypertensive chronic kidney disease with stage 1 through stage 4 chronic kidney disease, or unspecified chronic kidney disease: Secondary | ICD-10-CM | POA: Diagnosis not present

## 2020-05-15 DIAGNOSIS — I890 Lymphedema, not elsewhere classified: Secondary | ICD-10-CM | POA: Diagnosis not present

## 2020-05-15 DIAGNOSIS — L89623 Pressure ulcer of left heel, stage 3: Secondary | ICD-10-CM | POA: Diagnosis not present

## 2020-05-15 DIAGNOSIS — E1122 Type 2 diabetes mellitus with diabetic chronic kidney disease: Secondary | ICD-10-CM | POA: Diagnosis not present

## 2020-05-15 DIAGNOSIS — R262 Difficulty in walking, not elsewhere classified: Secondary | ICD-10-CM | POA: Diagnosis not present

## 2020-05-15 DIAGNOSIS — N183 Chronic kidney disease, stage 3 unspecified: Secondary | ICD-10-CM | POA: Diagnosis not present

## 2020-05-15 DIAGNOSIS — L89893 Pressure ulcer of other site, stage 3: Secondary | ICD-10-CM | POA: Diagnosis not present

## 2020-05-15 NOTE — Progress Notes (Signed)
[facility] Note  05/17/2020     CHIEF COMPLAINT Patient presents for Retina Follow Up   HISTORY OF PRESENT ILLNESS: Mary [name] is a 75 y.o. female who presents to the clinic today for:   HPI    Retina Follow Up    Patient presents with  CRVO/BRVO.  In both eyes.  This started 6 weeks ago.  I, the attending physician,  performed the HPI with the patient and updated documentation appropriately.          Comments    Patient here for 6 weeks retina follow up for CRVO OU. Patient states vision doing alright. No eye pain.        Last edited by Bernarda Caffey, MD on 05/17/2020  9:54 AM. (History)    pt states vision seems okay, occasional pain in corner of right eye   Referring physician: Glendale Chard, MD [id number] [place],  [place] [address]  HISTORICAL INFORMATION:   Selected notes from the [facility] Transferred care from Dr. Zigmund Daniel   CURRENT MEDICATIONS: Current Outpatient Medications (Ophthalmic Drugs)  Medication Sig  . dorzolamide-timolol (COSOPT) 22.3-6.8 MG/ML ophthalmic solution Place 1 drop into the right eye 2 (two) times daily.   No current facility-administered medications for this visit. (Ophthalmic Drugs)   Current Outpatient Medications (Other)  Medication Sig  . aspirin 81 MG chewable tablet 1 tablet  . Assure Comfort Lancets 28G MISC   . Blood Glucose Monitoring Suppl (ONETOUCH VERIO FLEX SYSTEM) w/Device KIT Use as directed to check blood sugars 2 times per day dx: e11.65  . cetirizine (ZYRTEC) 10 MG tablet TAKE 1 TABLET BY MOUTH EVERY DAY  . Cyanocobalamin (VITAMIN B 12) 500 MCG TABS 1 tablet  . Dexchlorpheniramine Maleate (RYCLORA) 2 MG/5ML SOLN 5cc twice daily as needed  . diphenoxylate-atropine (LOMOTIL) 2.5-0.025 MG tablet Take by mouth.  . fluticasone (FLONASE) 50 MCG/ACT nasal spray SPRAY 1 SPRAY INTO EACH NOSTRIL EVERY DAY  . furosemide (LASIX) 40 MG tablet Take 1 tablet (40 mg  total) by mouth daily.  [name] glucose blood (ONETOUCH VERIO) test strip Use as directed to check blood sugars 2 times per day dx: e11.65  . HYDROcodone-acetaminophen (NORCO) 10-325 MG tablet Take 1 tablet by mouth 5 (five) times daily as needed.  [name] KLOR-CON M20 20 MEQ tablet Take 20 mEq by mouth daily. with food  . levothyroxine (SYNTHROID) 125 MCG tablet Take 125 mcg by mouth daily before breakfast.  . magnesium oxide (MAG-OX) 400 MG tablet TAKE 1 TABLET BY MOUTH EVERY DAY IN THE EVENING  . metoprolol succinate (TOPROL-XL) 50 MG 24 hr tablet Take 1 tablet (50 mg total) by mouth daily.  [name] omeprazole (PRILOSEC) 20 MG capsule TAKE 1 CAPSULE BY MOUTH  DAILY  . ondansetron (ZOFRAN) 4 MG tablet Take by mouth.  [name] Delica Lancets 13Y MISC Use as directed to check blood sugars 2 times per day dx: e11.65  . oxyCODONE-acetaminophen (PERCOCET) 7.5-325 MG tablet 1 tablet as needed  . pravastatin (PRAVACHOL) 40 MG tablet TAKE 1 TABLET BY MOUTH EVERY DAY  . predniSONE (DELTASONE) 10 MG tablet Take by mouth.   . sulfamethoxazole-trimethoprim (BACTRIM DS) 800-160 MG tablet Take 1 tablet by mouth 2 (two) times daily.  [name] telmisartan (MICARDIS) 80 MG tablet TAKE 1 TABLET BY MOUTH  DAILY  . triamcinolone (KENALOG) 0.1 % APPLY TO AFFECTED AREA TWICE A DAY   No current facility-administered medications for this visit. (Other)  REVIEW OF SYSTEMS: ROS    Positive for: Genitourinary, Musculoskeletal, Endocrine, Eyes   Negative for: Constitutional, Gastrointestinal, Neurological, Skin, HENT, Cardiovascular, Respiratory, Psychiatric, Allergic/Imm, Heme/Lymph   Last edited by Theodore Demark, COA on 05/17/2020  8:56 AM. (History)       ALLERGIES No Known Allergies  PAST MEDICAL HISTORY Past Medical History:  Diagnosis Date  . Arthritis    osteoarthritis  . Cataract    OS  . Diabetes mellitus    Insulin dependent  . Diabetic retinopathy ([place])    NPDR OU  . Glaucoma    POAG OD  . H/O  cardiovascular stress test    pt. reports 10/13- was normal per pt.   . Hyperlipemia   . Hypertension   . Hypertensive retinopathy    OU   Past Surgical History:  Procedure Laterality Date  . CARPAL TUNNEL RELEASE Bilateral   . CATARACT EXTRACTION Right 10/05/2018   Dr. Kathlen Mody  . EYE SURGERY Right    Cat Sx  . KNEE ARTHROSCOPY     right  . QUADRICEPS TENDON REPAIR  02/17/2012   Procedure: REPAIR QUADRICEP TENDON;  Surgeon: Vickey Huger, MD;  Location: [facility];  Service: Orthopedics;  Laterality: Right;  right quadriceps repair with latera release  . QUADRICEPS TENDON REPAIR Right 05/15/2012   Procedure: REPAIR QUADRICEP TENDON;  Surgeon: Vickey Huger, MD;  Location: [facility];  Service: Orthopedics;  Laterality: Right;  . TOTAL KNEE ARTHROPLASTY  10/21/2011   rt tk  . TOTAL KNEE ARTHROPLASTY  10/21/2011   Procedure: TOTAL KNEE ARTHROPLASTY;  Surgeon: Rudean Haskell, MD;  Location: [facility];  Service: Orthopedics;  Laterality: Right;  . TUBAL LIGATION      FAMILY HISTORY Family History  Problem Relation Age of Onset  . Hypertension Mother   . Heart Problems Father   . Gout Father   . Arthritis Father     SOCIAL HISTORY Social History   Tobacco Use  . Smoking status: Former Smoker    Packs/day: 1.00    Years: 30.00    Pack years: 30.00    Types: Cigarettes    Start date: 01/15/1967    Quit date: 06/28/2014    Years since quitting: 5.8  . Smokeless tobacco: Never Used  Vaping Use  . Vaping Use: Never used  Substance Use Topics  . Alcohol use: No  . Drug use: Yes    Types: Hydrocodone         OPHTHALMIC EXAM:  Base Eye Exam    Visual Acuity (Snellen - Linear)      Right Left   Dist cc 20/50 -2 CF@ face   Dist ph cc NI        Tonometry (Tonopen, 8:54 AM)      Right Left   Pressure 14 18       Pupils      Dark Light Shape React APD   Right 3 2 Round Brisk None   Left 3 2 Round Brisk None       Visual Fields (Counting fingers)      Left Right     Full    Restrictions Total superior temporal, inferior temporal, inferior nasal deficiencies; Partial outer superior nasal deficiency        Extraocular Movement      Right Left    Full Full       Neuro/Psych    Oriented x3: Yes   Mood/Affect: Normal       Dilation  Both eyes: 1.0% Mydriacyl, 2.5% Phenylephrine @ 8:54 AM        Slit Lamp and Fundus Exam    Slit Lamp Exam      Right Left   Lids/Lashes Dermatochalasis - upper lid, mild Meibomian gland dysfunction Dermatochalasis - upper lid, mild Meibomian gland dysfunction   Conjunctiva/Sclera Melanosis Melanosis   Cornea 2-3+ Punctate epithelial erosions, well healed temporal cataract wounds 3+ Punctate epithelial erosions, well healed temporal cataract wounds   Anterior Chamber Deep and quiet Deep and quiet   Iris Round and moderately dilated to 5.26m Round and moderately dilated to 674m  Lens PC IOL in good position, trace, cortical remnant temporally, PC folds 2-3+ Nuclear sclerosis, 2-3+ Cortical cataract   Vitreous Vitreous syneresis, vitreous condensations, Ozurdex pellet remnants inferiorly, no residual IVT Vitreous syneresis       Fundus Exam      Right Left   Disc  2-3+Pallor, Sharp rim, temporal PPP Mild Pallor, Sharp rim, PPP, severally attenuated vessels   C/D Ratio 0.5 0.3   Macula Blunted foveal reflex, Drusen, Retinal pigment epithelial mottling, temporal edema with focal exudates -- slightly improved Flat, Blunted foveal reflex, RPE mottling and clumping, scarring, atrophy, no heme   Vessels attenuated, Tortuous Severe Vascular attenuation, +fibrosis   Periphery Attached, no heme    Attached, pavingstone and reticular degeneration inferiorly, scattered RPE atrophy, greatest peripapillary            IMAGING AND PROCEDURES  Imaging and Procedures for @TODAY @  OCT, Retina - OU - Both Eyes       Right Eye Quality was good. Central Foveal Thickness: 202. Progression has improved. Findings include abnormal foveal  contour, intraretinal fluid, intraretinal hyper-reflective material, no SRF, vitreomacular adhesion  (Mild interval improvement in temporal IRF).   Left Eye Quality was good. Central Foveal Thickness: 211. Progression has been stable. Findings include abnormal foveal contour, no IRF, no SRF, outer retinal atrophy, epiretinal membrane, preretinal fibrosis, subretinal hyper-reflective material, pigment epithelial detachment (Stable from prior).   Notes *Images captured and stored on drive  Diagnosis / Impression:  OD: CRVO w/ Mild interval improvement in temporal IRF OS: diffuse ORA - Stable from prior  Clinical management:  See below  Abbreviations: NFP - Normal foveal profile. CME - cystoid macular edema. PED - pigment epithelial detachment. IRF - intraretinal fluid. SRF - subretinal fluid. EZ - ellipsoid zone. ERM - epiretinal membrane. ORA - outer retinal atrophy. ORT - outer retinal tubulation. SRHM - subretinal hyper-reflective material        Intravitreal Injection, Pharmacologic Agent - OD - Right Eye       Time Out 05/17/2020. 9:12 AM. Confirmed correct patient, procedure, site, and patient consented.   Anesthesia Topical anesthesia was used. Anesthetic medications included Lidocaine 2%, Proparacaine 0.5%.   Procedure Preparation included 5% betadine to ocular surface, eyelid speculum. A 27 gauge needle was used.   Injection:  4 mg Triescence 4093mlL injection   NDC: 006415-391-9619ot: [id number] date: 02/22/2022   Route: Intravitreal, Site: Right Eye, Waste: 0.9 mL  Post-op Post injection exam found visual acuity of at least counting fingers. The patient tolerated the procedure well. There were no complications. The patient received written and verbal post procedure care education. Post injection medications were not given.   Notes An AC tap was performed following injection due to elevated IOP using a 30 gauge needle on a syringe with the plunger removed. The  needle was placed at the limbus at 7  oclock and approximately 0.06 cc of aqueous was removed from the anterior chamber. Betadine was applied to the tap area before and after the paracentesis was performed. There were no complications. The patient tolerated the procedure well. The IOP was rechecked and was found to be ~10 mmHg by palpation.                 ASSESSMENT/PLAN:    ICD-10-CM   1. Central retinal vein occlusion with macular edema of both eyes  H34.8130 Intravitreal Injection, Pharmacologic Agent - OD - Right Eye    triamcinolone acetonide (TRIESENCE) 40 MG/ML subtenons injection 4 mg  2. Moderate nonproliferative diabetic retinopathy of both eyes with macular edema associated with type 2 diabetes mellitus ([employer])  G62.6948   3. Retinal edema  H35.81 OCT, Retina - OU - Both Eyes  4. Essential hypertension  I10   5. Hypertensive retinopathy of both eyes  H35.033   6. Combined forms of age-related cataract of left eye  H25.812   7. Pseudophakia  Z96.1   8. Primary open angle glaucoma (POAG) of right eye, mild stage  H40.1111    1. CRVO w/ CME OU  - pt of Dr. Zigmund Daniel who wished to transfer care  - OS long-standing low vision (HM) with subfoveal scar and central retinal atrophy  - OD with CME actively being managed with Ozurdex OD ~q5 wks by Dr. Zigmund Daniel (last on 8.20.20) s/p multiple IVTA and Ozurdex OD  - was due for Ozurdex OD w/ Zigmund Daniel on 9.25.20, but underwent cataract surgery OD w/ Kathlen Mody on 9.21.20  - s/p IVTA OD #4 (10.26.20), #5 (12.10.20), #6 (01.21.21), #7 (03.05.21), #8 (04.16.21), #9 (05.28.21), #10 (07.09.21), #11 (09.14.21), #12 (10.29.21), #13 (12.17.21), #14 (01.27.22), #15 (03.11.2022)  - BCVA stable at 20/50   - exam shows no residual IVTA in vitreous cavity  - OCT shows interval decrease in IRF ST macula and fovea at 6 wks  - recommend IVTA OD #16 today, 05.04.22  - pt wishes to proceed  - RBA of procedure discussed, questions answered  - informed consent  obtained  - see procedure note -- AC tap  - f/u in 6 wks -- DFE/OCT/possible injection  2,3. Severe Non-proliferative diabetic retinopathy OU.  - OCT shows diabetic macular edema, OD   - recommend IVTA OD today, 05.04.22, as above  4,5. Hypertensive retinopathy OU  - discussed importance of tight BP control  - monitor  6. Mixed form age related cataract OS  - under the expert management of Dr. Kathlen Mody  7. Pseudophakia OD  - s/p CE/IOL OD (9.21.20) by expert surgeon, Dr. Kathlen Mody  - IOL in excellent position  - had post op IOP spike -- IOP now under better control  - monitor  8. POAG OD  - under the expert management of Dr. Kathlen Mody  - s/p SLT and goniotomy OD  - had post-cataract IOP spike  - IOP 14 today  - AV tap post IVTA injection  Ophthalmic Meds Ordered this visit:  Meds ordered this encounter  Medications  . triamcinolone acetonide (TRIESENCE) 40 MG/ML subtenons injection 4 mg       Return in about 6 weeks (around 06/28/2020) for f/u CRVO OU, DFE, OCT.  There are no Patient Instructions on file for this visit.   Explained the diagnoses, plan, and follow up with the patient and they expressed understanding.  Patient expressed understanding of the importance of proper follow up care.   This document serves as a record of  services personally performed by Gardiner Sleeper, MD, PhD. It was created on their behalf by Roselee Nova, COMT. The creation of this record is the provider's dictation and/or activities during the visit.  Electronically signed by: Roselee Nova, COMT 05/17/20 9:56 AM  This document serves as a record of services personally performed by Gardiner Sleeper, MD, PhD. It was created on their behalf by San Jetty. Owens Shark, OA an ophthalmic technician. The creation of this record is the provider's dictation and/or activities during the visit.    Electronically signed by: San Jetty. Owens Shark, [place] 05.04.2022 9:56 AM   Gardiner Sleeper, M.D., Ph.D. Diseases & Surgery of  the Retina and Vitreous [facility]  I have reviewed the above documentation for accuracy and completeness, and I agree with the above. Gardiner Sleeper, M.D., Ph.D. 05/17/20 9:56 AM   Abbreviations: M myopia (nearsighted); A astigmatism; H hyperopia (farsighted); P presbyopia; Mrx spectacle prescription;  CTL contact lenses; OD right eye; OS left eye; OU both eyes  XT exotropia; ET esotropia; PEK punctate epithelial keratitis; PEE punctate epithelial erosions; DES dry eye syndrome; MGD meibomian gland dysfunction; ATs artificial tears; PFAT's preservative free artificial tears; [place] nuclear sclerotic cataract; PSC posterior subcapsular cataract; ERM epi-retinal membrane; PVD posterior vitreous detachment; RD retinal detachment; DM diabetes mellitus; DR diabetic retinopathy; NPDR non-proliferative diabetic retinopathy; PDR proliferative diabetic retinopathy; CSME clinically significant macular edema; DME diabetic macular edema; dbh dot blot hemorrhages; CWS cotton wool spot; POAG primary open angle glaucoma; C/D cup-to-disc ratio; HVF humphrey visual field; GVF goldmann visual field; OCT optical coherence tomography; IOP intraocular pressure; BRVO Branch retinal vein occlusion; CRVO central retinal vein occlusion; CRAO central retinal artery occlusion; BRAO branch retinal artery occlusion; RT retinal tear; SB scleral buckle; PPV pars plana vitrectomy; VH Vitreous hemorrhage; PRP panretinal laser photocoagulation; IVK intravitreal kenalog; VMT vitreomacular traction; MH Macular hole;  NVD neovascularization of the disc; NVE neovascularization elsewhere; AREDS age related eye disease study; ARMD age related macular degeneration; POAG primary open angle glaucoma; EBMD epithelial/anterior basement membrane dystrophy; ACIOL anterior chamber intraocular lens; IOL intraocular lens; PCIOL posterior chamber intraocular lens; Phaco/IOL phacoemulsification with intraocular lens placement; [place]  photorefractive keratectomy; LASIK laser assisted in situ keratomileusis; HTN hypertension; DM diabetes mellitus; COPD chronic obstructive pulmonary disease

## 2020-05-16 DIAGNOSIS — E1122 Type 2 diabetes mellitus with diabetic chronic kidney disease: Secondary | ICD-10-CM | POA: Diagnosis not present

## 2020-05-16 DIAGNOSIS — I129 Hypertensive chronic kidney disease with stage 1 through stage 4 chronic kidney disease, or unspecified chronic kidney disease: Secondary | ICD-10-CM | POA: Diagnosis not present

## 2020-05-16 DIAGNOSIS — L89893 Pressure ulcer of other site, stage 3: Secondary | ICD-10-CM | POA: Diagnosis not present

## 2020-05-16 DIAGNOSIS — I890 Lymphedema, not elsewhere classified: Secondary | ICD-10-CM | POA: Diagnosis not present

## 2020-05-16 DIAGNOSIS — R262 Difficulty in walking, not elsewhere classified: Secondary | ICD-10-CM | POA: Diagnosis not present

## 2020-05-16 DIAGNOSIS — L89623 Pressure ulcer of left heel, stage 3: Secondary | ICD-10-CM | POA: Diagnosis not present

## 2020-05-16 DIAGNOSIS — M170 Bilateral primary osteoarthritis of knee: Secondary | ICD-10-CM | POA: Diagnosis not present

## 2020-05-16 DIAGNOSIS — N183 Chronic kidney disease, stage 3 unspecified: Secondary | ICD-10-CM | POA: Diagnosis not present

## 2020-05-17 ENCOUNTER — Ambulatory Visit (INDEPENDENT_AMBULATORY_CARE_PROVIDER_SITE_OTHER): Payer: Medicare Other

## 2020-05-17 ENCOUNTER — Ambulatory Visit (INDEPENDENT_AMBULATORY_CARE_PROVIDER_SITE_OTHER): Payer: Medicare Other | Admitting: Ophthalmology

## 2020-05-17 ENCOUNTER — Encounter (INDEPENDENT_AMBULATORY_CARE_PROVIDER_SITE_OTHER): Payer: Self-pay | Admitting: Ophthalmology

## 2020-05-17 ENCOUNTER — Other Ambulatory Visit: Payer: Self-pay

## 2020-05-17 VITALS — Ht 67.0 in | Wt 292.0 lb

## 2020-05-17 DIAGNOSIS — M170 Bilateral primary osteoarthritis of knee: Secondary | ICD-10-CM | POA: Diagnosis not present

## 2020-05-17 DIAGNOSIS — L89893 Pressure ulcer of other site, stage 3: Secondary | ICD-10-CM | POA: Diagnosis not present

## 2020-05-17 DIAGNOSIS — Z0000 Encounter for general adult medical examination without abnormal findings: Secondary | ICD-10-CM

## 2020-05-17 DIAGNOSIS — L89623 Pressure ulcer of left heel, stage 3: Secondary | ICD-10-CM | POA: Diagnosis not present

## 2020-05-17 DIAGNOSIS — E113313 Type 2 diabetes mellitus with moderate nonproliferative diabetic retinopathy with macular edema, bilateral: Secondary | ICD-10-CM | POA: Diagnosis not present

## 2020-05-17 DIAGNOSIS — H3581 Retinal edema: Secondary | ICD-10-CM

## 2020-05-17 DIAGNOSIS — I10 Essential (primary) hypertension: Secondary | ICD-10-CM

## 2020-05-17 DIAGNOSIS — R262 Difficulty in walking, not elsewhere classified: Secondary | ICD-10-CM | POA: Diagnosis not present

## 2020-05-17 DIAGNOSIS — H25812 Combined forms of age-related cataract, left eye: Secondary | ICD-10-CM | POA: Diagnosis not present

## 2020-05-17 DIAGNOSIS — Z961 Presence of intraocular lens: Secondary | ICD-10-CM | POA: Diagnosis not present

## 2020-05-17 DIAGNOSIS — N183 Chronic kidney disease, stage 3 unspecified: Secondary | ICD-10-CM | POA: Diagnosis not present

## 2020-05-17 DIAGNOSIS — I129 Hypertensive chronic kidney disease with stage 1 through stage 4 chronic kidney disease, or unspecified chronic kidney disease: Secondary | ICD-10-CM | POA: Diagnosis not present

## 2020-05-17 DIAGNOSIS — H34813 Central retinal vein occlusion, bilateral, with macular edema: Secondary | ICD-10-CM

## 2020-05-17 DIAGNOSIS — H35033 Hypertensive retinopathy, bilateral: Secondary | ICD-10-CM | POA: Diagnosis not present

## 2020-05-17 DIAGNOSIS — E1122 Type 2 diabetes mellitus with diabetic chronic kidney disease: Secondary | ICD-10-CM | POA: Diagnosis not present

## 2020-05-17 DIAGNOSIS — H401111 Primary open-angle glaucoma, right eye, mild stage: Secondary | ICD-10-CM | POA: Diagnosis not present

## 2020-05-17 DIAGNOSIS — I890 Lymphedema, not elsewhere classified: Secondary | ICD-10-CM | POA: Diagnosis not present

## 2020-05-17 MED ORDER — TRIAMCINOLONE ACETONIDE 40 MG/ML IO SUSP
4.0000 mg | INTRAOCULAR | Status: AC | PRN
  Administered 2020-05-17: 4 mg via INTRAVITREAL

## 2020-05-17 NOTE — Progress Notes (Signed)
I connected with Sincerity Cedar today by telephone and verified that I am speaking with the correct person using two identifiers. Location patient: home Location provider: work Persons participating in the virtual visit: Madelein Mahadeo, Glenna Durand LPN.   I discussed the limitations, risks, security and privacy concerns of performing an evaluation and management service by telephone and the availability of in person appointments. I also discussed with the patient that there may be a patient responsible charge related to this service. The patient expressed understanding and verbally consented to this telephonic visit.    Interactive audio and video telecommunications were attempted between this provider and patient, however failed, due to patient having technical difficulties OR patient did not have access to video capability.  We continued and completed visit with audio only.     Vital signs may be patient reported or missing.  Subjective:   Mary [name] is a 75 y.o. female who presents for Medicare Annual (Subsequent) preventive examination.  Review of Systems     Cardiac Risk Factors include: advanced age (>54mn, >>74women);diabetes mellitus;hypertension;obesity (BMI >30kg/m2)     Objective:    Today's Vitals   05/17/20 1531  Weight: 292 lb (132.5 kg)  Height: _0  (1.702 m)   Body mass index is 45.73 kg/m.  Advanced Directives 05/17/2020 01/27/2020 09/02/2019 06/28/2019 07/21/2018 10/30/2017 07/16/2015  Does Patient Have a Medical Advance Directive? Yes No Yes Yes Yes Yes No  Type of Advance Directive Out of facility DNR (pink MOST or yellow form) - [place] will [place] will -  Does patient want to make changes to medical advance directive? - - - - - No - Patient declined -  Copy of [place] Chart? - - No - copy  requested - No - copy requested No - copy requested -  Would patient like information on creating a medical advance directive? - No - Patient declined - No - Patient declined - - -  Pre-existing out of facility DNR order (yellow form or pink MOST form) - - - - - - -    Current Medications (verified) Outpatient Encounter Medications as of 05/17/2020  Medication Sig  . aspirin 81 MG chewable tablet 1 tablet  . Assure Comfort Lancets 28G MISC   . Blood Glucose Monitoring Suppl (ONETOUCH VERIO FLEX SYSTEM) w/Device KIT Use as directed to check blood sugars 2 times per day dx: e11.65  . cetirizine (ZYRTEC) 10 MG tablet TAKE 1 TABLET BY MOUTH EVERY DAY  . fluticasone (FLONASE) 50 MCG/ACT nasal spray SPRAY 1 SPRAY INTO EACH NOSTRIL EVERY DAY  . furosemide (LASIX) 40 MG tablet Take 1 tablet (40 mg total) by mouth daily.  .Marland Kitchenglucose blood (ONETOUCH VERIO) test strip Use as directed to check blood sugars 2 times per day dx: e11.65  . HYDROcodone-acetaminophen (NORCO) 10-325 MG tablet Take 1 tablet by mouth 5 (five) times daily as needed.  .Marland Kitchenlevothyroxine (SYNTHROID) 125 MCG tablet Take 125 mcg by mouth daily before breakfast.  . magnesium oxide (MAG-OX) 400 MG tablet TAKE 1 TABLET BY MOUTH EVERY DAY IN THE EVENING  . metoprolol succinate (TOPROL-XL) 50 MG 24 hr tablet Take 1 tablet (50 mg total) by mouth daily.  .Marland Kitchenomeprazole (PRILOSEC) 20 MG capsule TAKE 1 CAPSULE BY MOUTH  DAILY  . OneTouch Delica Lancets 316XMISC Use as directed to check blood sugars 2 times per day dx:  e11.65  . pravastatin (PRAVACHOL) 40 MG tablet TAKE 1 TABLET BY MOUTH EVERY DAY  . telmisartan (MICARDIS) 80 MG tablet TAKE 1 TABLET BY MOUTH  DAILY  . timolol (BETIMOL) 0.25 % ophthalmic solution Place 1 drop into the right eye 2 (two) times daily.  [name] triamcinolone (KENALOG) 0.1 % APPLY TO AFFECTED AREA TWICE A DAY  . Cyanocobalamin (VITAMIN B 12) 500 MCG TABS 1 tablet (Patient not taking: Reported on 05/17/2020)  . Dexchlorpheniramine  Maleate (RYCLORA) 2 MG/5ML SOLN 5cc twice daily as needed (Patient not taking: Reported on 05/17/2020)  . diphenoxylate-atropine (LOMOTIL) 2.5-0.025 MG tablet Take by mouth. (Patient not taking: Reported on 05/17/2020)  . dorzolamide-timolol (COSOPT) 22.3-6.8 MG/ML ophthalmic solution Place 1 drop into the right eye 2 (two) times daily. (Patient not taking: Reported on 05/17/2020)  . KLOR-CON M20 20 MEQ tablet Take 20 mEq by mouth daily. with food (Patient not taking: Reported on 05/17/2020)  . ondansetron (ZOFRAN) 4 MG tablet Take by mouth. (Patient not taking: Reported on 05/17/2020)  . oxyCODONE-acetaminophen (PERCOCET) 7.5-325 MG tablet 1 tablet as needed (Patient not taking: Reported on 05/17/2020)  . predniSONE (DELTASONE) 10 MG tablet Take by mouth.  (Patient not taking: Reported on 05/17/2020)  . sulfamethoxazole-trimethoprim (BACTRIM DS) 800-160 MG tablet Take 1 tablet by mouth 2 (two) times daily. (Patient not taking: Reported on 05/17/2020)   No facility-administered encounter medications on file as of 05/17/2020.    Allergies (verified) Patient has no known allergies.   History: Past Medical History:  Diagnosis Date  . Arthritis    osteoarthritis  . Cataract    OS  . Diabetes mellitus    Insulin dependent  . Diabetic retinopathy ([place])    NPDR OU  . Glaucoma    POAG OD  . H/O cardiovascular stress test    pt. reports 10/13- was normal per pt.   . Hyperlipemia   . Hypertension   . Hypertensive retinopathy    OU   Past Surgical History:  Procedure Laterality Date  . CARPAL TUNNEL RELEASE Bilateral   . CATARACT EXTRACTION Right 10/05/2018   Dr. Kathlen Mody  . EYE SURGERY Right    Cat Sx  . KNEE ARTHROSCOPY     right  . QUADRICEPS TENDON REPAIR  02/17/2012   Procedure: REPAIR QUADRICEP TENDON;  Surgeon: Vickey Huger, MD;  Location: [place];  Service: Orthopedics;  Laterality: Right;  right quadriceps repair with latera release  . QUADRICEPS TENDON REPAIR Right 05/15/2012   Procedure: REPAIR  QUADRICEP TENDON;  Surgeon: Vickey Huger, MD;  Location: [place];  Service: Orthopedics;  Laterality: Right;  . TOTAL KNEE ARTHROPLASTY  10/21/2011   rt tk  . TOTAL KNEE ARTHROPLASTY  10/21/2011   Procedure: TOTAL KNEE ARTHROPLASTY;  Surgeon: Rudean Haskell, MD;  Location: [place];  Service: Orthopedics;  Laterality: Right;  . TUBAL LIGATION     Family History  Problem Relation Age of Onset  . Hypertension Mother   . Heart Problems Father   . Gout Father   . Arthritis Father    Social History   Socioeconomic History  . Marital status: Single    Spouse name: Not on file  . Number of children: Not on file  . Years of education: Not on file  . Highest education level: Not on file  Occupational History  . Occupation: retired  Tobacco Use  . Smoking status: Former Smoker    Packs/day: 1.00    Years: 30.00    Pack years: 30.00  Types: Cigarettes    Start date: 01/15/1967    Quit date: 06/28/2014    Years since quitting: 5.8  . Smokeless tobacco: Never Used  Vaping Use  . Vaping Use: Never used  Substance and Sexual Activity  . Alcohol use: No  . Drug use: Yes    Types: Hydrocodone  . Sexual activity: Not Currently  Other Topics Concern  . Not on file  Social History Narrative  . Not on file   Social Determinants of Health   Financial Resource Strain: Low Risk   . Difficulty of Paying Living Expenses: Not hard at all  Food Insecurity: No Food Insecurity  . Worried About Charity fundraiser in the Last Year: Never true  . Ran Out of Food in the Last Year: Never true  Transportation Needs: No Transportation Needs  . Lack of Transportation (Medical): No  . Lack of Transportation (Non-Medical): No  Physical Activity: Insufficiently Active  . Days of Exercise per Week: 2 days  . Minutes of Exercise per Session: 60 min  Stress: No Stress Concern Present  . Feeling of Stress : Not at all  Social Connections: Not on file    Tobacco Counseling Counseling given: Not  Answered   Clinical Intake:  Pre-visit preparation completed: Yes  Pain : No/denies pain     Nutritional Status: BMI > 30  Obese Nutritional Risks: None Diabetes: Yes  How often do you need to have someone help you when you read instructions, pamphlets, or other written materials from your doctor or pharmacy?: 1 - Never What is the last grade level you completed in school?: 12th grade  Diabetic? Yes Nutrition Risk Assessment:  Has the patient had any N/V/D within the last 2 months?  No  Does the patient have any non-healing wounds?  No  Has the patient had any unintentional weight loss or weight gain?  No   Diabetes:  Is the patient diabetic?  Yes  If diabetic, was a CBG obtained today?  No  Did the patient bring in their glucometer from home?  No  How often do you monitor your CBG's? Every other day.   Financial Strains and Diabetes Management:  Are you having any financial strains with the device, your supplies or your medication? No .  Does the patient want to be seen by Chronic Care Management for management of their diabetes?  No  Would the patient like to be referred to a Nutritionist or for Diabetic Management?  No   Diabetic Exams:  Diabetic Eye Exam: Completed 10/18/2019 Diabetic Foot Exam: Overdue, Pt has been advised about the importance in completing this exam. Pt is scheduled for diabetic foot exam on next appointment.   Interpreter Needed?: No  Information entered by :: NAllen LPN   Activities of Daily Living In your present state of health, do you have any difficulty performing the following activities: 05/17/2020  Hearing? N  Vision? Y  Comment has sight in right eye  Difficulty concentrating or making decisions? N  Walking or climbing stairs? Y  Dressing or bathing? N  Doing errands, shopping? N  Preparing Food and eating ? N  Using the Toilet? N  In the past six months, have you accidently leaked urine? N  Do you have problems with loss of  bowel control? N  Managing your Medications? N  Managing your Finances? N  Housekeeping or managing your Housekeeping? N  Some recent data might be hidden    Patient Care Team: Baird Cancer,  Bailey Mech, MD as PCP - General (Internal Medicine) Hayden Pedro, MD as Consulting Physician (Ophthalmology) Jacelyn Pi, MD as Consulting Physician (Endocrinology) Rex Kras, Claudette Stapler, RN as Case Manager [name] as Social Worker Troxler, Lujean Amel, OT as Occupational Therapist (Occupational Therapy)  Indicate any recent Medical Services you may have received from other than [facility] providers in the past year (date may be approximate).     Assessment:   This is a routine wellness examination for Floretta.  Hearing/Vision screen  Hearing Screening   _0  _1  _2  _3  _4  _5  _6  _7  _8   Right ear:           Left ear:           Vision Screening Comments: Regular eye exams, [facility], Dr. Milus Height  Dietary issues and exercise activities discussed: Current Exercise Habits: Home exercise routine, Type of exercise: walking;strength training/weights (PT), Time (Minutes): 60, Frequency (Times/Week): 2, Weekly Exercise (Minutes/Week): 120  Goals Addressed            This Visit's Progress   . Patient Stated       05/17/2020, wants to walk without walker      Depression Screen PHQ 2/9 Scores 05/17/2020 09/13/2019 09/02/2019 08/09/2019 07/21/2018 06/24/2018 02/18/2018  PHQ - 2 Score 0 0 0 0 0 0 0  PHQ- 9 Score - - - - 0 - -    Fall Risk Fall Risk  05/17/2020 09/02/2019 08/30/2019 11/16/2018 07/21/2018  Falls in the past year? _9 0  Comment lost balance legs gave away, slid out of the bed - - -  Number falls in past yr: _10 -  Comment - - - - -  Injury with Fall? 0 0 0 0 -  Risk for fall due to : Impaired balance/gait;Impaired mobility;Medication side effect Impaired mobility;History of fall(s);Medication side effect History of fall(s);Impaired  balance/gait;Impaired mobility;Impaired vision - Medication side effect  Follow up Falls evaluation completed;Education provided;Falls prevention discussed Falls evaluation completed;Education provided;Falls prevention discussed Falls evaluation completed;Education provided;Falls prevention discussed - Falls evaluation completed;Falls prevention discussed    FALL RISK PREVENTION PERTAINING TO THE HOME:  Any stairs in or around the home? No  If so, are there any without handrails? n/a Home free of loose throw rugs in walkways, pet beds, electrical cords, etc? Yes  Adequate lighting in your home to reduce risk of falls? Yes   ASSISTIVE DEVICES UTILIZED TO PREVENT FALLS:  Life alert? Yes  Use of a cane, walker or w/c? Yes  Grab bars in the bathroom? Yes  Shower chair or bench in shower? Yes  Elevated toilet seat or a handicapped toilet? Yes   TIMED UP AND GO:  Was the test performed? No .  Cognitive Function:     6CIT Screen 05/17/2020 09/02/2019 07/21/2018 10/30/2017  What Year? 0 points 0 points 0 points 0 points  What month? 0 points 0 points 0 points 0 points  What time? 0 points 0 points 0 points 0 points  Count back from 20 0 points 0 points 0 points 0 points  Months in reverse 0 points 0 points 0 points 0 points  Repeat phrase 8 points 0 points 0 points 0 points  Total Score 8 0 0 0    Immunizations Immunization History  Administered Date(s) Administered  . Fluad Quad(high Dose 65+) 11/24/2019  . Influenza Split 10/22/2011  . Influenza, High Dose Seasonal PF 11/16/2018, 12/26/2018  . Influenza, Quadrivalent, Recombinant, Inj, Pf  10/07/2016, 10/13/2017  . Influenza-Unspecified 10/16/2017  . PFIZER(Purple Top)SARS-COV-2 Vaccination 02/27/2019, 03/24/2019  . Pneumococcal Polysaccharide-23 10/15/2016  . Tdap 02/15/2013    TDAP status: Up to date  Flu Vaccine status: Up to date  Pneumococcal vaccine status: Due, Education has been provided regarding the importance of  this vaccine. Advised may receive this vaccine at local pharmacy or Health Dept. Aware to provide a copy of the vaccination record if obtained from local pharmacy or Health Dept. Verbalized acceptance and understanding.  Covid-19 vaccine status: Completed vaccines  Qualifies for Shingles Vaccine? Yes   Zostavax completed No   Shingrix Completed?: No.    Education has been provided regarding the importance of this vaccine. Patient has been advised to call insurance company to determine out of pocket expense if they have not yet received this vaccine. Advised may also receive vaccine at local pharmacy or Health Dept. Verbalized acceptance and understanding.  Screening Tests Health Maintenance  Topic Date Due  . FOOT EXAM  Never done  . COLONOSCOPY (Pts 45-49yrs Insurance coverage will need to be confirmed)  Never done  . PNA vac Low Risk Adult (2 of 2 - PCV13) 10/15/2017  . COVID-19 Vaccine (3 - Booster for Pfizer series) 09/24/2019  . MAMMOGRAM  05/17/2021 (Originally 03/16/2020)  . INFLUENZA VACCINE  08/14/2020  . HEMOGLOBIN A1C  09/27/2020  . OPHTHALMOLOGY EXAM  10/17/2020  . TETANUS/TDAP  02/16/2023  . DEXA SCAN  Completed  . Hepatitis C Screening  Completed  . HPV VACCINES  Aged Out    Health Maintenance  Health Maintenance Due  Topic Date Due  . FOOT EXAM  Never done  . COLONOSCOPY (Pts 45-58yrs Insurance coverage will need to be confirmed)  Never done  . PNA vac Low Risk Adult (2 of 2 - PCV13) 10/15/2017  . COVID-19 Vaccine (3 - Booster for Pfizer series) 09/24/2019    Colorectal cancer screening: Type of screening: Cologuard. Completed 07/20/2018. Repeat every 3 years  Mammogram status: decline  Bone Density status: Completed 03/17/2018.   Lung Cancer Screening: (Low Dose CT Chest recommended if Age 73-80 years, 30 pack-year currently smoking OR have quit w/in 15years.) does not qualify.   Lung Cancer Screening Referral: no  Additional Screening:  Hepatitis C Screening:  does qualify; Completed 11/18/2017  Vision Screening: Recommended annual ophthalmology exams for early detection of glaucoma and other disorders of the eye. Is the patient up to date with their annual eye exam?  Yes  Who is the provider or what is the name of the office in which the patient attends annual eye exams? Dr. Milus Height If pt is not established with a provider, would they like to be referred to a provider to establish care? No .   Dental Screening: Recommended annual dental exams for proper oral hygiene  Community Resource Referral / Chronic Care Management: CRR required this visit?  No   CCM required this visit?  No      Plan:     I have personally reviewed and noted the following in the patient's chart:   . Medical and social history . Use of alcohol, tobacco or illicit drugs  . Current medications and supplements including opioid prescriptions.  . Functional ability and status . Nutritional status . Physical activity . Advanced directives . List of other physicians . Hospitalizations, surgeries, and ER visits in previous 12 months . Vitals . Screenings to include cognitive, depression, and falls . Referrals and appointments  In addition, I have reviewed and discussed  with patient certain preventive protocols, quality metrics, and best practice recommendations. A written personalized care plan for preventive services as well as general preventive health recommendations were provided to patient.     Kellie Simmering, LPN   06/20/3417   Nurse Notes:

## 2020-05-17 NOTE — Patient Instructions (Signed)
Mary [name] , Thank you for taking time to come for your Medicare Wellness Visit. I appreciate your ongoing commitment to your health goals. Please review the following plan we discussed and let me know if I can assist you in the future.   Screening recommendations/referrals: Colonoscopy: cologuard 07/20/2018 Mammogram: decline Bone Density: completed 03/17/2018 Recommended yearly ophthalmology/optometry visit for glaucoma screening and checkup Recommended yearly dental visit for hygiene and checkup  Vaccinations: Influenza vaccine: completed 11/24/2019, due 08/14/2020 Pneumococcal vaccine: due Tdap vaccine: completed 02/15/2013, due 02/16/2023 Shingles vaccine: discussed   Covid-19: 03/24/2019, 02/27/2019  Advanced directives: copy in chart  Conditions/risks identified: none  Next appointment: Follow up in one year for your annual wellness visit    Preventive Care 65 Years and Older, Female Preventive care refers to lifestyle choices and visits with your health care provider that can promote health and wellness. What does preventive care include?  A yearly physical exam. This is also called an annual well check.  Dental exams once or twice a year.  Routine eye exams. Ask your health care provider how often you should have your eyes checked.  Personal lifestyle choices, including:  Daily care of your teeth and gums.  Regular physical activity.  Eating a healthy diet.  Avoiding tobacco and drug use.  Limiting alcohol use.  Practicing safe sex.  Taking low-dose aspirin every day.  Taking vitamin and mineral supplements as recommended by your health care provider. What happens during an annual well check? The services and screenings done by your health care provider during your annual well check will depend on your age, overall health, lifestyle risk factors, and family history of disease. Counseling  Your health care provider may ask you questions about your:  Alcohol  use.  Tobacco use.  Drug use.  Emotional well-being.  Home and relationship well-being.  Sexual activity.  Eating habits.  History of falls.  Memory and ability to understand (cognition).  Work and work Astronomer.  Reproductive health. Screening  You may have the following tests or measurements:  Height, weight, and BMI.  Blood pressure.  Lipid and cholesterol levels. These may be checked every 5 years, or more frequently if you are over 43 years old.  Skin check.  Lung cancer screening. You may have this screening every year starting at age 61 if you have a 30-pack-year history of smoking and currently smoke or have quit within the past 15 years.  Fecal occult blood test (FOBT) of the stool. You may have this test every year starting at age 80.  Flexible sigmoidoscopy or colonoscopy. You may have a sigmoidoscopy every 5 years or a colonoscopy every 10 years starting at age 36.  Hepatitis C blood test.  Hepatitis B blood test.  Sexually transmitted disease (STD) testing.  Diabetes screening. This is done by checking your blood sugar (glucose) after you have not eaten for a while (fasting). You may have this done every 1-3 years.  Bone density scan. This is done to screen for osteoporosis. You may have this done starting at age 16.  Mammogram. This may be done every 1-2 years. Talk to your health care provider about how often you should have regular mammograms. Talk with your health care provider about your test results, treatment options, and if necessary, the need for more tests. Vaccines  Your health care provider may recommend certain vaccines, such as:  Influenza vaccine. This is recommended every year.  Tetanus, diphtheria, and acellular pertussis (Tdap, Td) vaccine. You may need  a Td booster every 10 years.  Zoster vaccine. You may need this after age 79.  Pneumococcal 13-valent conjugate (PCV13) vaccine. One dose is recommended after age  37.  Pneumococcal polysaccharide (PPSV23) vaccine. One dose is recommended after age 77. Talk to your health care provider about which screenings and vaccines you need and how often you need them. This information is not intended to replace advice given to you by your health care provider. Make sure you discuss any questions you have with your health care provider. Document Released: 01/27/2015 Document Revised: 09/20/2015 Document Reviewed: 11/01/2014 Elsevier Interactive Patient Education  2017 [place] Prevention in the Home Falls can cause injuries. They can happen to people of all ages. There are many things you can do to make your home safe and to help prevent falls. What can I do on the outside of my home?  Regularly fix the edges of walkways and driveways and fix any cracks.  Remove anything that might make you trip as you walk through a door, such as a raised step or threshold.  Trim any bushes or trees on the path to your home.  Use bright outdoor lighting.  Clear any walking paths of anything that might make someone trip, such as rocks or tools.  Regularly check to see if handrails are loose or broken. Make sure that both sides of any steps have handrails.  Any raised decks and porches should have guardrails on the edges.  Have any leaves, snow, or ice cleared regularly.  Use sand or salt on walking paths during winter.  Clean up any spills in your garage right away. This includes oil or grease spills. What can I do in the bathroom?  Use night lights.  Install grab bars by the toilet and in the tub and shower. Do not use towel bars as grab bars.  Use non-skid mats or decals in the tub or shower.  If you need to sit down in the shower, use a plastic, non-slip stool.  Keep the floor dry. Clean up any water that spills on the floor as soon as it happens.  Remove soap buildup in the tub or shower regularly.  Attach bath mats securely with double-sided  non-slip rug tape.  Do not have throw rugs and other things on the floor that can make you trip. What can I do in the bedroom?  Use night lights.  Make sure that you have a light by your bed that is easy to reach.  Do not use any sheets or blankets that are too big for your bed. They should not hang down onto the floor.  Have a firm chair that has side arms. You can use this for support while you get dressed.  Do not have throw rugs and other things on the floor that can make you trip. What can I do in the kitchen?  Clean up any spills right away.  Avoid walking on wet floors.  Keep items that you use a lot in easy-to-reach places.  If you need to reach something above you, use a strong step stool that has a grab bar.  Keep electrical cords out of the way.  Do not use floor polish or wax that makes floors slippery. If you must use wax, use non-skid floor wax.  Do not have throw rugs and other things on the floor that can make you trip. What can I do with my stairs?  Do not leave any items on the  stairs.  Make sure that there are handrails on both sides of the stairs and use them. Fix handrails that are broken or loose. Make sure that handrails are as long as the stairways.  Check any carpeting to make sure that it is firmly attached to the stairs. Fix any carpet that is loose or worn.  Avoid having throw rugs at the top or bottom of the stairs. If you do have throw rugs, attach them to the floor with carpet tape.  Make sure that you have a light switch at the top of the stairs and the bottom of the stairs. If you do not have them, ask someone to add them for you. What else can I do to help prevent falls?  Wear shoes that:  Do not have high heels.  Have rubber bottoms.  Are comfortable and fit you well.  Are closed at the toe. Do not wear sandals.  If you use a stepladder:  Make sure that it is fully opened. Do not climb a closed stepladder.  Make sure that both  sides of the stepladder are locked into place.  Ask someone to hold it for you, if possible.  Clearly mark and make sure that you can see:  Any grab bars or handrails.  First and last steps.  Where the edge of each step is.  Use tools that help you move around (mobility aids) if they are needed. These include:  Canes.  Walkers.  Scooters.  Crutches.  Turn on the lights when you go into a dark area. Replace any light bulbs as soon as they burn out.  Set up your furniture so you have a clear path. Avoid moving your furniture around.  If any of your floors are uneven, fix them.  If there are any pets around you, be aware of where they are.  Review your medicines with your doctor. Some medicines can make you feel dizzy. This can increase your chance of falling. Ask your doctor what other things that you can do to help prevent falls. This information is not intended to replace advice given to you by your health care provider. Make sure you discuss any questions you have with your health care provider. Document Released: 10/27/2008 Document Revised: 06/08/2015 Document Reviewed: 02/04/2014 Elsevier Interactive Patient Education  2017 [employer].

## 2020-05-18 DIAGNOSIS — I890 Lymphedema, not elsewhere classified: Secondary | ICD-10-CM | POA: Diagnosis not present

## 2020-05-18 DIAGNOSIS — L89893 Pressure ulcer of other site, stage 3: Secondary | ICD-10-CM | POA: Diagnosis not present

## 2020-05-18 DIAGNOSIS — M170 Bilateral primary osteoarthritis of knee: Secondary | ICD-10-CM | POA: Diagnosis not present

## 2020-05-18 DIAGNOSIS — I129 Hypertensive chronic kidney disease with stage 1 through stage 4 chronic kidney disease, or unspecified chronic kidney disease: Secondary | ICD-10-CM | POA: Diagnosis not present

## 2020-05-18 DIAGNOSIS — R262 Difficulty in walking, not elsewhere classified: Secondary | ICD-10-CM | POA: Diagnosis not present

## 2020-05-18 DIAGNOSIS — L89623 Pressure ulcer of left heel, stage 3: Secondary | ICD-10-CM | POA: Diagnosis not present

## 2020-05-18 DIAGNOSIS — E1122 Type 2 diabetes mellitus with diabetic chronic kidney disease: Secondary | ICD-10-CM | POA: Diagnosis not present

## 2020-05-18 DIAGNOSIS — N183 Chronic kidney disease, stage 3 unspecified: Secondary | ICD-10-CM | POA: Diagnosis not present

## 2020-05-22 DIAGNOSIS — M170 Bilateral primary osteoarthritis of knee: Secondary | ICD-10-CM | POA: Diagnosis not present

## 2020-05-22 DIAGNOSIS — N183 Chronic kidney disease, stage 3 unspecified: Secondary | ICD-10-CM | POA: Diagnosis not present

## 2020-05-22 DIAGNOSIS — L89623 Pressure ulcer of left heel, stage 3: Secondary | ICD-10-CM | POA: Diagnosis not present

## 2020-05-22 DIAGNOSIS — E1122 Type 2 diabetes mellitus with diabetic chronic kidney disease: Secondary | ICD-10-CM | POA: Diagnosis not present

## 2020-05-22 DIAGNOSIS — I129 Hypertensive chronic kidney disease with stage 1 through stage 4 chronic kidney disease, or unspecified chronic kidney disease: Secondary | ICD-10-CM | POA: Diagnosis not present

## 2020-05-22 DIAGNOSIS — R262 Difficulty in walking, not elsewhere classified: Secondary | ICD-10-CM | POA: Diagnosis not present

## 2020-05-22 DIAGNOSIS — I890 Lymphedema, not elsewhere classified: Secondary | ICD-10-CM | POA: Diagnosis not present

## 2020-05-22 DIAGNOSIS — L89893 Pressure ulcer of other site, stage 3: Secondary | ICD-10-CM | POA: Diagnosis not present

## 2020-05-23 DIAGNOSIS — I129 Hypertensive chronic kidney disease with stage 1 through stage 4 chronic kidney disease, or unspecified chronic kidney disease: Secondary | ICD-10-CM | POA: Diagnosis not present

## 2020-05-23 DIAGNOSIS — R262 Difficulty in walking, not elsewhere classified: Secondary | ICD-10-CM | POA: Diagnosis not present

## 2020-05-23 DIAGNOSIS — L89893 Pressure ulcer of other site, stage 3: Secondary | ICD-10-CM | POA: Diagnosis not present

## 2020-05-23 DIAGNOSIS — Z96659 Presence of unspecified artificial knee joint: Secondary | ICD-10-CM | POA: Diagnosis not present

## 2020-05-23 DIAGNOSIS — L89623 Pressure ulcer of left heel, stage 3: Secondary | ICD-10-CM | POA: Diagnosis not present

## 2020-05-23 DIAGNOSIS — N183 Chronic kidney disease, stage 3 unspecified: Secondary | ICD-10-CM | POA: Diagnosis not present

## 2020-05-23 DIAGNOSIS — M170 Bilateral primary osteoarthritis of knee: Secondary | ICD-10-CM | POA: Diagnosis not present

## 2020-05-23 DIAGNOSIS — E1122 Type 2 diabetes mellitus with diabetic chronic kidney disease: Secondary | ICD-10-CM | POA: Diagnosis not present

## 2020-05-23 DIAGNOSIS — I890 Lymphedema, not elsewhere classified: Secondary | ICD-10-CM | POA: Diagnosis not present

## 2020-05-24 ENCOUNTER — Telehealth: Payer: Medicare Other

## 2020-05-25 ENCOUNTER — Other Ambulatory Visit: Payer: Self-pay

## 2020-05-25 DIAGNOSIS — I129 Hypertensive chronic kidney disease with stage 1 through stage 4 chronic kidney disease, or unspecified chronic kidney disease: Secondary | ICD-10-CM | POA: Diagnosis not present

## 2020-05-25 DIAGNOSIS — L89893 Pressure ulcer of other site, stage 3: Secondary | ICD-10-CM | POA: Diagnosis not present

## 2020-05-25 DIAGNOSIS — I890 Lymphedema, not elsewhere classified: Secondary | ICD-10-CM | POA: Diagnosis not present

## 2020-05-25 DIAGNOSIS — L89623 Pressure ulcer of left heel, stage 3: Secondary | ICD-10-CM | POA: Diagnosis not present

## 2020-05-25 DIAGNOSIS — M170 Bilateral primary osteoarthritis of knee: Secondary | ICD-10-CM | POA: Diagnosis not present

## 2020-05-25 DIAGNOSIS — E1122 Type 2 diabetes mellitus with diabetic chronic kidney disease: Secondary | ICD-10-CM | POA: Diagnosis not present

## 2020-05-25 DIAGNOSIS — R262 Difficulty in walking, not elsewhere classified: Secondary | ICD-10-CM | POA: Diagnosis not present

## 2020-05-25 DIAGNOSIS — N183 Chronic kidney disease, stage 3 unspecified: Secondary | ICD-10-CM | POA: Diagnosis not present

## 2020-05-26 DIAGNOSIS — R262 Difficulty in walking, not elsewhere classified: Secondary | ICD-10-CM | POA: Diagnosis not present

## 2020-05-26 DIAGNOSIS — I890 Lymphedema, not elsewhere classified: Secondary | ICD-10-CM | POA: Diagnosis not present

## 2020-05-26 DIAGNOSIS — L89623 Pressure ulcer of left heel, stage 3: Secondary | ICD-10-CM | POA: Diagnosis not present

## 2020-05-26 DIAGNOSIS — L89893 Pressure ulcer of other site, stage 3: Secondary | ICD-10-CM | POA: Diagnosis not present

## 2020-05-26 DIAGNOSIS — N183 Chronic kidney disease, stage 3 unspecified: Secondary | ICD-10-CM | POA: Diagnosis not present

## 2020-05-26 DIAGNOSIS — M170 Bilateral primary osteoarthritis of knee: Secondary | ICD-10-CM | POA: Diagnosis not present

## 2020-05-26 DIAGNOSIS — E1122 Type 2 diabetes mellitus with diabetic chronic kidney disease: Secondary | ICD-10-CM | POA: Diagnosis not present

## 2020-05-26 DIAGNOSIS — I129 Hypertensive chronic kidney disease with stage 1 through stage 4 chronic kidney disease, or unspecified chronic kidney disease: Secondary | ICD-10-CM | POA: Diagnosis not present

## 2020-05-29 DIAGNOSIS — I890 Lymphedema, not elsewhere classified: Secondary | ICD-10-CM | POA: Diagnosis not present

## 2020-05-29 DIAGNOSIS — M170 Bilateral primary osteoarthritis of knee: Secondary | ICD-10-CM | POA: Diagnosis not present

## 2020-05-29 DIAGNOSIS — L89623 Pressure ulcer of left heel, stage 3: Secondary | ICD-10-CM | POA: Diagnosis not present

## 2020-05-29 DIAGNOSIS — R262 Difficulty in walking, not elsewhere classified: Secondary | ICD-10-CM | POA: Diagnosis not present

## 2020-05-29 DIAGNOSIS — E1122 Type 2 diabetes mellitus with diabetic chronic kidney disease: Secondary | ICD-10-CM | POA: Diagnosis not present

## 2020-05-29 DIAGNOSIS — N183 Chronic kidney disease, stage 3 unspecified: Secondary | ICD-10-CM | POA: Diagnosis not present

## 2020-05-29 DIAGNOSIS — I129 Hypertensive chronic kidney disease with stage 1 through stage 4 chronic kidney disease, or unspecified chronic kidney disease: Secondary | ICD-10-CM | POA: Diagnosis not present

## 2020-05-29 DIAGNOSIS — L89893 Pressure ulcer of other site, stage 3: Secondary | ICD-10-CM | POA: Diagnosis not present

## 2020-05-30 DIAGNOSIS — N183 Chronic kidney disease, stage 3 unspecified: Secondary | ICD-10-CM | POA: Diagnosis not present

## 2020-05-30 DIAGNOSIS — I129 Hypertensive chronic kidney disease with stage 1 through stage 4 chronic kidney disease, or unspecified chronic kidney disease: Secondary | ICD-10-CM | POA: Diagnosis not present

## 2020-05-30 DIAGNOSIS — L89893 Pressure ulcer of other site, stage 3: Secondary | ICD-10-CM | POA: Diagnosis not present

## 2020-05-30 DIAGNOSIS — E1122 Type 2 diabetes mellitus with diabetic chronic kidney disease: Secondary | ICD-10-CM | POA: Diagnosis not present

## 2020-05-30 DIAGNOSIS — R262 Difficulty in walking, not elsewhere classified: Secondary | ICD-10-CM | POA: Diagnosis not present

## 2020-05-30 DIAGNOSIS — L89623 Pressure ulcer of left heel, stage 3: Secondary | ICD-10-CM | POA: Diagnosis not present

## 2020-05-30 DIAGNOSIS — M170 Bilateral primary osteoarthritis of knee: Secondary | ICD-10-CM | POA: Diagnosis not present

## 2020-05-30 DIAGNOSIS — I890 Lymphedema, not elsewhere classified: Secondary | ICD-10-CM | POA: Diagnosis not present

## 2020-06-01 ENCOUNTER — Other Ambulatory Visit: Payer: Self-pay | Admitting: Internal Medicine

## 2020-06-01 DIAGNOSIS — L89623 Pressure ulcer of left heel, stage 3: Secondary | ICD-10-CM | POA: Diagnosis not present

## 2020-06-01 DIAGNOSIS — I129 Hypertensive chronic kidney disease with stage 1 through stage 4 chronic kidney disease, or unspecified chronic kidney disease: Secondary | ICD-10-CM | POA: Diagnosis not present

## 2020-06-01 DIAGNOSIS — N183 Chronic kidney disease, stage 3 unspecified: Secondary | ICD-10-CM | POA: Diagnosis not present

## 2020-06-01 DIAGNOSIS — E1122 Type 2 diabetes mellitus with diabetic chronic kidney disease: Secondary | ICD-10-CM | POA: Diagnosis not present

## 2020-06-01 DIAGNOSIS — M170 Bilateral primary osteoarthritis of knee: Secondary | ICD-10-CM | POA: Diagnosis not present

## 2020-06-01 DIAGNOSIS — L89893 Pressure ulcer of other site, stage 3: Secondary | ICD-10-CM | POA: Diagnosis not present

## 2020-06-01 DIAGNOSIS — I890 Lymphedema, not elsewhere classified: Secondary | ICD-10-CM | POA: Diagnosis not present

## 2020-06-01 DIAGNOSIS — R262 Difficulty in walking, not elsewhere classified: Secondary | ICD-10-CM | POA: Diagnosis not present

## 2020-06-04 ENCOUNTER — Other Ambulatory Visit: Payer: Self-pay | Admitting: Internal Medicine

## 2020-06-05 DIAGNOSIS — L89623 Pressure ulcer of left heel, stage 3: Secondary | ICD-10-CM | POA: Diagnosis not present

## 2020-06-05 DIAGNOSIS — M170 Bilateral primary osteoarthritis of knee: Secondary | ICD-10-CM | POA: Diagnosis not present

## 2020-06-05 DIAGNOSIS — I129 Hypertensive chronic kidney disease with stage 1 through stage 4 chronic kidney disease, or unspecified chronic kidney disease: Secondary | ICD-10-CM | POA: Diagnosis not present

## 2020-06-05 DIAGNOSIS — N183 Chronic kidney disease, stage 3 unspecified: Secondary | ICD-10-CM | POA: Diagnosis not present

## 2020-06-05 DIAGNOSIS — R262 Difficulty in walking, not elsewhere classified: Secondary | ICD-10-CM | POA: Diagnosis not present

## 2020-06-05 DIAGNOSIS — I890 Lymphedema, not elsewhere classified: Secondary | ICD-10-CM | POA: Diagnosis not present

## 2020-06-05 DIAGNOSIS — L89893 Pressure ulcer of other site, stage 3: Secondary | ICD-10-CM | POA: Diagnosis not present

## 2020-06-05 DIAGNOSIS — E1122 Type 2 diabetes mellitus with diabetic chronic kidney disease: Secondary | ICD-10-CM | POA: Diagnosis not present

## 2020-06-06 ENCOUNTER — Other Ambulatory Visit: Payer: Self-pay

## 2020-06-06 DIAGNOSIS — E1122 Type 2 diabetes mellitus with diabetic chronic kidney disease: Secondary | ICD-10-CM | POA: Diagnosis not present

## 2020-06-06 DIAGNOSIS — L89623 Pressure ulcer of left heel, stage 3: Secondary | ICD-10-CM | POA: Diagnosis not present

## 2020-06-06 DIAGNOSIS — M170 Bilateral primary osteoarthritis of knee: Secondary | ICD-10-CM | POA: Diagnosis not present

## 2020-06-06 DIAGNOSIS — I129 Hypertensive chronic kidney disease with stage 1 through stage 4 chronic kidney disease, or unspecified chronic kidney disease: Secondary | ICD-10-CM | POA: Diagnosis not present

## 2020-06-06 DIAGNOSIS — L89893 Pressure ulcer of other site, stage 3: Secondary | ICD-10-CM | POA: Diagnosis not present

## 2020-06-06 DIAGNOSIS — I890 Lymphedema, not elsewhere classified: Secondary | ICD-10-CM | POA: Diagnosis not present

## 2020-06-06 DIAGNOSIS — R262 Difficulty in walking, not elsewhere classified: Secondary | ICD-10-CM | POA: Diagnosis not present

## 2020-06-06 DIAGNOSIS — N183 Chronic kidney disease, stage 3 unspecified: Secondary | ICD-10-CM | POA: Diagnosis not present

## 2020-06-06 NOTE — Patient Outreach (Signed)
Aging Gracefully Program  06/06/2020  Mary [name] Dec 23, 1945 [id number]  [facility] Evaluation Interviewer attempted to call patient on today regarding Aging Gracefully final evaluation. No answer from patient after multiple rings. CMA left confidential voicemail for patient to return call.  Will attempt to call back within 1 week.   [facility] Care Management Assistant [phone]

## 2020-06-07 ENCOUNTER — Ambulatory Visit: Payer: Medicare Other

## 2020-06-07 DIAGNOSIS — N183 Chronic kidney disease, stage 3 unspecified: Secondary | ICD-10-CM

## 2020-06-07 DIAGNOSIS — E1122 Type 2 diabetes mellitus with diabetic chronic kidney disease: Secondary | ICD-10-CM

## 2020-06-07 NOTE — Patient Instructions (Signed)
Social Worker Visit Information  Goals we discussed today:  Goals Addressed            This Visit's Progress   . wotk with SW to manage care coordination needs   On track    Timeframe:  Long-Range Goal Priority:  High Start Date:  2.1.22                          Expected End Date: 5.2.22                       Next planned outreach: 6.24.22  Patient Goals/Self-Care Activities Over the next 30 days, patient will:   - Patient will self administer medications as prescribed -Patient will attend all scheduled provider appointments -Patient will call provider office for new concerns or questions -Contact SW as needed prior to next scheduled call        Materials Provided: Verbal education about CAP/DA program provided by phone  Follow Up Plan: SW will follow up with patient by phone over the next month  Bevelyn Ngo, BSW, CDP Social Worker, Certified Dementia Practitioner [facility] Care Management [phone]

## 2020-06-07 NOTE — Chronic Care Management (AMB) (Signed)
Chronic Care Management    Social Work Note  06/07/2020 Name: Mary [name] MRN: [id number] DOB: [birth year]  Mary [name] is a 75 y.o. year old female who is a primary care patient of Glendale Chard, MD. The CCM team was consulted to assist the patient with chronic disease management and/or care coordination needs related to: [employer] .   Engaged with patient by telephone for follow up visit in response to provider referral for social work chronic care management and care coordination services.   Consent to Services:  The patient was given information about Chronic Care Management services, agreed to services, and gave verbal consent prior to initiation of services.  Please see initial visit note for detailed documentation.   Patient agreed to services and consent obtained.   Assessment: Review of patient past medical history, allergies, medications, and health status, including review of relevant consultants reports was performed today as part of a comprehensive evaluation and provision of chronic care management and care coordination services.     SDOH (Social Determinants of Health) assessments and interventions performed:    Advanced Directives Status: Not addressed in this encounter.  CCM Care Plan  No Known Allergies  Outpatient Encounter Medications as of 06/07/2020  Medication Sig Note  . aspirin 81 MG chewable tablet 1 tablet   . Assure Comfort Lancets 28G MISC    . Blood Glucose Monitoring Suppl (ONETOUCH VERIO FLEX SYSTEM) w/Device KIT Use as directed to check blood sugars 2 times per day dx: e11.65   . cetirizine (ZYRTEC) 10 MG tablet TAKE 1 TABLET BY MOUTH EVERY DAY   . Cyanocobalamin (VITAMIN B 12) 500 MCG TABS 1 tablet (Patient not taking: Reported on 05/17/2020)   . Dexchlorpheniramine Maleate (RYCLORA) 2 MG/5ML SOLN 5cc twice daily as needed (Patient not taking: Reported on 05/17/2020)   . diphenoxylate-atropine (LOMOTIL) 2.5-0.025 MG tablet Take by  mouth. (Patient not taking: Reported on 05/17/2020)   . dorzolamide-timolol (COSOPT) 22.3-6.8 MG/ML ophthalmic solution Place 1 drop into the right eye 2 (two) times daily. (Patient not taking: Reported on 05/17/2020)   . fluticasone (FLONASE) 50 MCG/ACT nasal spray SPRAY 1 SPRAY INTO EACH NOSTRIL EVERY DAY   . furosemide (LASIX) 40 MG tablet TAKE 1 TABLET BY MOUTH  DAILY   . glucose blood (ONETOUCH VERIO) test strip Use as directed to check blood sugars 2 times per day dx: e11.65   . HYDROcodone-acetaminophen (NORCO) 10-325 MG tablet Take 1 tablet by mouth 5 (five) times daily as needed.   [name] KLOR-CON M20 20 MEQ tablet Take 20 mEq by mouth daily. with food (Patient not taking: Reported on 05/17/2020) 02/23/2018: Patient instructed to hold this medication due to abnormal Potassium level of 6.2 on 02/18/18, per MD, Dr. Glendale Chard. Patient was given verbal education related to complications from hypo and hyperkalemia and importance of following MD recommendations. Patient instructed to resume this medication when directed by PCP. Patient verbalizes understanding.   [name] levothyroxine (SYNTHROID) 125 MCG tablet Take 125 mcg by mouth daily before breakfast.   . magnesium oxide (MAG-OX) 400 MG tablet TAKE 1 TABLET BY MOUTH EVERY DAY IN THE EVENING   . metoprolol succinate (TOPROL-XL) 50 MG 24 hr tablet TAKE 1 TABLET BY MOUTH  DAILY   . omeprazole (PRILOSEC) 20 MG capsule TAKE 1 CAPSULE BY MOUTH  DAILY   . ondansetron (ZOFRAN) 4 MG tablet Take by mouth. (Patient not taking: Reported on 05/17/2020)   . OneTouch Delica Lancets 48G MISC Use as directed  to check blood sugars 2 times per day dx: e11.65   . oxyCODONE-acetaminophen (PERCOCET) 7.5-325 MG tablet 1 tablet as needed (Patient not taking: Reported on 05/17/2020)   . pravastatin (PRAVACHOL) 40 MG tablet TAKE 1 TABLET BY MOUTH EVERY DAY   . predniSONE (DELTASONE) 10 MG tablet Take by mouth.  (Patient not taking: Reported on 05/17/2020)   . sulfamethoxazole-trimethoprim  (BACTRIM DS) 800-160 MG tablet Take 1 tablet by mouth 2 (two) times daily. (Patient not taking: Reported on 05/17/2020)   . telmisartan (MICARDIS) 80 MG tablet TAKE 1 TABLET BY MOUTH  DAILY   . timolol (BETIMOL) 0.25 % ophthalmic solution Place 1 drop into the right eye 2 (two) times daily.   [name] triamcinolone (KENALOG) 0.1 % APPLY TO AFFECTED AREA TWICE A DAY    No facility-administered encounter medications on file as of 06/07/2020.    Patient Active Problem List   Diagnosis Date Noted  . Change in bowel habit 01/20/2020  . Chronic anemia 01/20/2020  . Colon cancer screening 01/20/2020  . Diabetes mellitus with stage 3 chronic kidney disease ([place]) 02/21/2018  . 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 Primary osteoarthritis of both knees 02/21/2018  . Class 3 severe obesity due to excess calories with serious comorbidity and body mass index (BMI) of 45.0 to 49.9 in adult ([place]) 02/21/2018  . Skin ulcer of right foot with fat layer exposed ([place]) 02/18/2018  . Morbid (severe) obesity due to excess calories ([place]) 11/18/2017  . Knee pain 07/16/2015  . Lateral dislocation of right patella 05/04/2015  . Rupture of right quadriceps tendon 05/24/2014  . S/P TKR (total knee replacement) 05/05/2012    Conditions to be addressed/monitored: DMII and CKD Stage III; Limited access to caregiver  Care Plan : Social Work Care Plan  Updates made by [name] since 06/07/2020 12:00 AM    Problem: Care Coordination     Long-Range Goal: Identify resources to assist with care coordination needs   Start Date: 02/15/2020  Expected End Date: 05/15/2020  Recent Progress: On track  Priority: High  Note:   Current Barriers:  . Limited social support . Transportation . ADL IADL limitations . Chronic conditions including DM II, CKD III, and obesity which put patient at increased risk for hospitalization   Social Work Clinical Goal(s):  [name] Over the next 90 days, patient will work with SW to address  concerns related to care coordination  Interventions: . 1:1 collaboration with Glendale Chard, MD regarding development and update of comprehensive plan of care as evidenced by provider attestation and co-signature . Inter-disciplinary care team collaboration (see longitudinal plan of care) . Successful outbound call placed to the patient to assess for care coordination needs . Discussed the patient is no longer active with [facility] but is active with home health . Patient reports she is still interested in identifying caregiver resource options . Reviewed with the patient that she has applied for CAP/DA and is on the waiting list . Determined the patients wheelchair is now functioning properly . No acute SW needs identified at this time . Scheduled follow up call over the next month  Patient Goals/Self-Care Activities Over the next 30 days, patient will:   - Patient will self administer medications as prescribed Patient will attend all scheduled provider appointments Patient will call provider office for new concerns or questions Contact SW as needed prior to next scheduled call  Follow up Plan: SW will follow up with patient by phone over the next 30  days       Follow Up Plan: SW will follow up with patient by phone over the next month      [name], BSW, CDP Social Worker, Certified Dementia Practitioner [place] [facility] [place] Management [phone]  Total time spent performing care coordination and/or care management activities with the patient by phone or face to face = 15 minutes.

## 2020-06-08 DIAGNOSIS — N183 Chronic kidney disease, stage 3 unspecified: Secondary | ICD-10-CM | POA: Diagnosis not present

## 2020-06-08 DIAGNOSIS — L89623 Pressure ulcer of left heel, stage 3: Secondary | ICD-10-CM | POA: Diagnosis not present

## 2020-06-08 DIAGNOSIS — I129 Hypertensive chronic kidney disease with stage 1 through stage 4 chronic kidney disease, or unspecified chronic kidney disease: Secondary | ICD-10-CM | POA: Diagnosis not present

## 2020-06-08 DIAGNOSIS — L89893 Pressure ulcer of other site, stage 3: Secondary | ICD-10-CM | POA: Diagnosis not present

## 2020-06-08 DIAGNOSIS — I890 Lymphedema, not elsewhere classified: Secondary | ICD-10-CM | POA: Diagnosis not present

## 2020-06-08 DIAGNOSIS — M170 Bilateral primary osteoarthritis of knee: Secondary | ICD-10-CM | POA: Diagnosis not present

## 2020-06-08 DIAGNOSIS — R262 Difficulty in walking, not elsewhere classified: Secondary | ICD-10-CM | POA: Diagnosis not present

## 2020-06-08 DIAGNOSIS — E1122 Type 2 diabetes mellitus with diabetic chronic kidney disease: Secondary | ICD-10-CM | POA: Diagnosis not present

## 2020-06-11 DIAGNOSIS — I890 Lymphedema, not elsewhere classified: Secondary | ICD-10-CM | POA: Diagnosis not present

## 2020-06-13 DIAGNOSIS — R262 Difficulty in walking, not elsewhere classified: Secondary | ICD-10-CM | POA: Diagnosis not present

## 2020-06-13 DIAGNOSIS — E1122 Type 2 diabetes mellitus with diabetic chronic kidney disease: Secondary | ICD-10-CM | POA: Diagnosis not present

## 2020-06-13 DIAGNOSIS — N183 Chronic kidney disease, stage 3 unspecified: Secondary | ICD-10-CM | POA: Diagnosis not present

## 2020-06-13 DIAGNOSIS — I129 Hypertensive chronic kidney disease with stage 1 through stage 4 chronic kidney disease, or unspecified chronic kidney disease: Secondary | ICD-10-CM | POA: Diagnosis not present

## 2020-06-13 DIAGNOSIS — L89893 Pressure ulcer of other site, stage 3: Secondary | ICD-10-CM | POA: Diagnosis not present

## 2020-06-13 DIAGNOSIS — M170 Bilateral primary osteoarthritis of knee: Secondary | ICD-10-CM | POA: Diagnosis not present

## 2020-06-13 DIAGNOSIS — L89623 Pressure ulcer of left heel, stage 3: Secondary | ICD-10-CM | POA: Diagnosis not present

## 2020-06-13 DIAGNOSIS — I890 Lymphedema, not elsewhere classified: Secondary | ICD-10-CM | POA: Diagnosis not present

## 2020-06-14 DIAGNOSIS — M170 Bilateral primary osteoarthritis of knee: Secondary | ICD-10-CM | POA: Diagnosis not present

## 2020-06-14 DIAGNOSIS — N183 Chronic kidney disease, stage 3 unspecified: Secondary | ICD-10-CM | POA: Diagnosis not present

## 2020-06-14 DIAGNOSIS — L89623 Pressure ulcer of left heel, stage 3: Secondary | ICD-10-CM | POA: Diagnosis not present

## 2020-06-14 DIAGNOSIS — L89893 Pressure ulcer of other site, stage 3: Secondary | ICD-10-CM | POA: Diagnosis not present

## 2020-06-14 DIAGNOSIS — I129 Hypertensive chronic kidney disease with stage 1 through stage 4 chronic kidney disease, or unspecified chronic kidney disease: Secondary | ICD-10-CM | POA: Diagnosis not present

## 2020-06-14 DIAGNOSIS — R262 Difficulty in walking, not elsewhere classified: Secondary | ICD-10-CM | POA: Diagnosis not present

## 2020-06-14 DIAGNOSIS — E1122 Type 2 diabetes mellitus with diabetic chronic kidney disease: Secondary | ICD-10-CM | POA: Diagnosis not present

## 2020-06-14 DIAGNOSIS — I890 Lymphedema, not elsewhere classified: Secondary | ICD-10-CM | POA: Diagnosis not present

## 2020-06-15 DIAGNOSIS — L89623 Pressure ulcer of left heel, stage 3: Secondary | ICD-10-CM | POA: Diagnosis not present

## 2020-06-15 DIAGNOSIS — E1122 Type 2 diabetes mellitus with diabetic chronic kidney disease: Secondary | ICD-10-CM | POA: Diagnosis not present

## 2020-06-15 DIAGNOSIS — M170 Bilateral primary osteoarthritis of knee: Secondary | ICD-10-CM | POA: Diagnosis not present

## 2020-06-15 DIAGNOSIS — R262 Difficulty in walking, not elsewhere classified: Secondary | ICD-10-CM | POA: Diagnosis not present

## 2020-06-15 DIAGNOSIS — I890 Lymphedema, not elsewhere classified: Secondary | ICD-10-CM | POA: Diagnosis not present

## 2020-06-15 DIAGNOSIS — I129 Hypertensive chronic kidney disease with stage 1 through stage 4 chronic kidney disease, or unspecified chronic kidney disease: Secondary | ICD-10-CM | POA: Diagnosis not present

## 2020-06-15 DIAGNOSIS — L89893 Pressure ulcer of other site, stage 3: Secondary | ICD-10-CM | POA: Diagnosis not present

## 2020-06-15 DIAGNOSIS — N183 Chronic kidney disease, stage 3 unspecified: Secondary | ICD-10-CM | POA: Diagnosis not present

## 2020-06-17 DIAGNOSIS — L89893 Pressure ulcer of other site, stage 3: Secondary | ICD-10-CM | POA: Diagnosis not present

## 2020-06-17 DIAGNOSIS — L89623 Pressure ulcer of left heel, stage 3: Secondary | ICD-10-CM | POA: Diagnosis not present

## 2020-06-19 DIAGNOSIS — I890 Lymphedema, not elsewhere classified: Secondary | ICD-10-CM | POA: Diagnosis not present

## 2020-06-19 DIAGNOSIS — R262 Difficulty in walking, not elsewhere classified: Secondary | ICD-10-CM | POA: Diagnosis not present

## 2020-06-19 DIAGNOSIS — L89893 Pressure ulcer of other site, stage 3: Secondary | ICD-10-CM | POA: Diagnosis not present

## 2020-06-19 DIAGNOSIS — E1122 Type 2 diabetes mellitus with diabetic chronic kidney disease: Secondary | ICD-10-CM | POA: Diagnosis not present

## 2020-06-19 DIAGNOSIS — L89623 Pressure ulcer of left heel, stage 3: Secondary | ICD-10-CM | POA: Diagnosis not present

## 2020-06-19 DIAGNOSIS — N183 Chronic kidney disease, stage 3 unspecified: Secondary | ICD-10-CM | POA: Diagnosis not present

## 2020-06-19 DIAGNOSIS — I129 Hypertensive chronic kidney disease with stage 1 through stage 4 chronic kidney disease, or unspecified chronic kidney disease: Secondary | ICD-10-CM | POA: Diagnosis not present

## 2020-06-19 DIAGNOSIS — M170 Bilateral primary osteoarthritis of knee: Secondary | ICD-10-CM | POA: Diagnosis not present

## 2020-06-20 DIAGNOSIS — E1122 Type 2 diabetes mellitus with diabetic chronic kidney disease: Secondary | ICD-10-CM | POA: Diagnosis not present

## 2020-06-20 DIAGNOSIS — M170 Bilateral primary osteoarthritis of knee: Secondary | ICD-10-CM | POA: Diagnosis not present

## 2020-06-20 DIAGNOSIS — I129 Hypertensive chronic kidney disease with stage 1 through stage 4 chronic kidney disease, or unspecified chronic kidney disease: Secondary | ICD-10-CM | POA: Diagnosis not present

## 2020-06-20 DIAGNOSIS — R262 Difficulty in walking, not elsewhere classified: Secondary | ICD-10-CM | POA: Diagnosis not present

## 2020-06-20 DIAGNOSIS — N183 Chronic kidney disease, stage 3 unspecified: Secondary | ICD-10-CM | POA: Diagnosis not present

## 2020-06-20 DIAGNOSIS — L89623 Pressure ulcer of left heel, stage 3: Secondary | ICD-10-CM | POA: Diagnosis not present

## 2020-06-20 DIAGNOSIS — L89893 Pressure ulcer of other site, stage 3: Secondary | ICD-10-CM | POA: Diagnosis not present

## 2020-06-20 DIAGNOSIS — I890 Lymphedema, not elsewhere classified: Secondary | ICD-10-CM | POA: Diagnosis not present

## 2020-06-21 DIAGNOSIS — E1122 Type 2 diabetes mellitus with diabetic chronic kidney disease: Secondary | ICD-10-CM | POA: Diagnosis not present

## 2020-06-21 DIAGNOSIS — M170 Bilateral primary osteoarthritis of knee: Secondary | ICD-10-CM | POA: Diagnosis not present

## 2020-06-21 DIAGNOSIS — L89623 Pressure ulcer of left heel, stage 3: Secondary | ICD-10-CM | POA: Diagnosis not present

## 2020-06-21 DIAGNOSIS — N183 Chronic kidney disease, stage 3 unspecified: Secondary | ICD-10-CM | POA: Diagnosis not present

## 2020-06-21 DIAGNOSIS — L89893 Pressure ulcer of other site, stage 3: Secondary | ICD-10-CM | POA: Diagnosis not present

## 2020-06-21 DIAGNOSIS — I129 Hypertensive chronic kidney disease with stage 1 through stage 4 chronic kidney disease, or unspecified chronic kidney disease: Secondary | ICD-10-CM | POA: Diagnosis not present

## 2020-06-21 DIAGNOSIS — R262 Difficulty in walking, not elsewhere classified: Secondary | ICD-10-CM | POA: Diagnosis not present

## 2020-06-21 DIAGNOSIS — I890 Lymphedema, not elsewhere classified: Secondary | ICD-10-CM | POA: Diagnosis not present

## 2020-06-22 ENCOUNTER — Other Ambulatory Visit: Payer: Self-pay

## 2020-06-22 DIAGNOSIS — M25512 Pain in left shoulder: Secondary | ICD-10-CM | POA: Diagnosis not present

## 2020-06-22 DIAGNOSIS — I890 Lymphedema, not elsewhere classified: Secondary | ICD-10-CM | POA: Diagnosis not present

## 2020-06-22 DIAGNOSIS — L89623 Pressure ulcer of left heel, stage 3: Secondary | ICD-10-CM | POA: Diagnosis not present

## 2020-06-22 DIAGNOSIS — R262 Difficulty in walking, not elsewhere classified: Secondary | ICD-10-CM | POA: Diagnosis not present

## 2020-06-22 DIAGNOSIS — M25561 Pain in right knee: Secondary | ICD-10-CM | POA: Diagnosis not present

## 2020-06-22 DIAGNOSIS — E1122 Type 2 diabetes mellitus with diabetic chronic kidney disease: Secondary | ICD-10-CM | POA: Diagnosis not present

## 2020-06-22 DIAGNOSIS — M150 Primary generalized (osteo)arthritis: Secondary | ICD-10-CM | POA: Diagnosis not present

## 2020-06-22 DIAGNOSIS — I129 Hypertensive chronic kidney disease with stage 1 through stage 4 chronic kidney disease, or unspecified chronic kidney disease: Secondary | ICD-10-CM | POA: Diagnosis not present

## 2020-06-22 DIAGNOSIS — L89893 Pressure ulcer of other site, stage 3: Secondary | ICD-10-CM | POA: Diagnosis not present

## 2020-06-22 DIAGNOSIS — M170 Bilateral primary osteoarthritis of knee: Secondary | ICD-10-CM | POA: Diagnosis not present

## 2020-06-22 DIAGNOSIS — N183 Chronic kidney disease, stage 3 unspecified: Secondary | ICD-10-CM | POA: Diagnosis not present

## 2020-06-22 DIAGNOSIS — G894 Chronic pain syndrome: Secondary | ICD-10-CM | POA: Diagnosis not present

## 2020-06-22 NOTE — Patient Outreach (Signed)
Aging Gracefully Program  06/22/2020  Mary [name] 02-Aug-1945 [id number]   [facility] Evaluation Interviewer made contact with patient. Aging Gracefully survey scheduled June 14,2022.   [facility] Care Management Assistant  [phone]

## 2020-06-23 DIAGNOSIS — Z96659 Presence of unspecified artificial knee joint: Secondary | ICD-10-CM | POA: Diagnosis not present

## 2020-06-23 DIAGNOSIS — M170 Bilateral primary osteoarthritis of knee: Secondary | ICD-10-CM | POA: Diagnosis not present

## 2020-06-23 DIAGNOSIS — E1122 Type 2 diabetes mellitus with diabetic chronic kidney disease: Secondary | ICD-10-CM | POA: Diagnosis not present

## 2020-06-23 DIAGNOSIS — I129 Hypertensive chronic kidney disease with stage 1 through stage 4 chronic kidney disease, or unspecified chronic kidney disease: Secondary | ICD-10-CM | POA: Diagnosis not present

## 2020-06-26 DIAGNOSIS — I890 Lymphedema, not elsewhere classified: Secondary | ICD-10-CM | POA: Diagnosis not present

## 2020-06-26 DIAGNOSIS — I129 Hypertensive chronic kidney disease with stage 1 through stage 4 chronic kidney disease, or unspecified chronic kidney disease: Secondary | ICD-10-CM | POA: Diagnosis not present

## 2020-06-26 DIAGNOSIS — L89623 Pressure ulcer of left heel, stage 3: Secondary | ICD-10-CM | POA: Diagnosis not present

## 2020-06-26 DIAGNOSIS — R262 Difficulty in walking, not elsewhere classified: Secondary | ICD-10-CM | POA: Diagnosis not present

## 2020-06-26 DIAGNOSIS — L89893 Pressure ulcer of other site, stage 3: Secondary | ICD-10-CM | POA: Diagnosis not present

## 2020-06-26 DIAGNOSIS — E1122 Type 2 diabetes mellitus with diabetic chronic kidney disease: Secondary | ICD-10-CM | POA: Diagnosis not present

## 2020-06-26 DIAGNOSIS — M170 Bilateral primary osteoarthritis of knee: Secondary | ICD-10-CM | POA: Diagnosis not present

## 2020-06-26 DIAGNOSIS — N183 Chronic kidney disease, stage 3 unspecified: Secondary | ICD-10-CM | POA: Diagnosis not present

## 2020-06-26 NOTE — Progress Notes (Addendum)
[facility] Note  06/30/2020     CHIEF COMPLAINT Patient presents for Retina Follow Up   HISTORY OF PRESENT ILLNESS: Mary [name] is a 75 y.o. female who presents to the clinic today for:   HPI     Retina Follow Up   Patient presents with  CRVO/BRVO.  In both eyes.  This started 6 weeks ago.  I, the attending physician,  performed the HPI with the patient and updated documentation appropriately.        Comments   Patient here for 6 weeks retina follow up for CRVO OU. Patient states vision thing seem to be getting darker. No eye pain.       Last edited by Bernarda Caffey, MD on 07/01/2020 11:36 PM.     pt states vision seems the same   Referring physician: Glendale Chard, [address] [place],  [place] [address]  HISTORICAL INFORMATION:   Selected notes from the [place] Transferred care from Dr. Zigmund Daniel   CURRENT MEDICATIONS: Current Outpatient Medications (Ophthalmic Drugs)  Medication Sig   dorzolamide-timolol (COSOPT) 22.3-6.8 MG/ML ophthalmic solution Place 1 drop into the right eye 2 (two) times daily. (Patient not taking: Reported on 05/17/2020)   timolol (BETIMOL) 0.25 % ophthalmic solution Place 1 drop into the right eye 2 (two) times daily.   No current facility-administered medications for this visit. (Ophthalmic Drugs)   Current Outpatient Medications (Other)  Medication Sig   aspirin 81 MG chewable tablet 1 tablet   Assure Comfort Lancets 28G MISC    Blood Glucose Monitoring Suppl (ONETOUCH VERIO FLEX SYSTEM) w/Device KIT Use as directed to check blood sugars 2 times per day dx: e11.65   cetirizine (ZYRTEC) 10 MG tablet TAKE 1 TABLET BY MOUTH EVERY DAY   Cyanocobalamin (VITAMIN B 12) 500 MCG TABS 1 tablet (Patient not taking: Reported on 05/17/2020)   Dexchlorpheniramine Maleate (RYCLORA) 2 MG/5ML SOLN 5cc twice daily as needed (Patient not taking: Reported on 05/17/2020)   diphenoxylate-atropine  (LOMOTIL) 2.5-0.025 MG tablet Take by mouth. (Patient not taking: Reported on 05/17/2020)   fluticasone (FLONASE) 50 MCG/ACT nasal spray SPRAY 1 SPRAY INTO EACH NOSTRIL EVERY DAY   furosemide (LASIX) 40 MG tablet TAKE 1 TABLET BY MOUTH  DAILY   glucose blood (ONETOUCH VERIO) test strip Use as directed to check blood sugars 2 times per day dx: e11.65   HYDROcodone-acetaminophen (NORCO) 10-325 MG tablet Take 1 tablet by mouth 5 (five) times daily as needed.   KLOR-CON M20 20 MEQ tablet Take 20 mEq by mouth daily. with food (Patient not taking: Reported on 05/17/2020)   levothyroxine (SYNTHROID) 125 MCG tablet Take 125 mcg by mouth daily before breakfast.   magnesium oxide (MAG-OX) 400 MG tablet TAKE 1 TABLET BY MOUTH EVERY DAY IN THE EVENING   metoprolol succinate (TOPROL-XL) 50 MG 24 hr tablet TAKE 1 TABLET BY MOUTH  DAILY   omeprazole (PRILOSEC) 20 MG capsule TAKE 1 CAPSULE BY MOUTH  DAILY   ondansetron (ZOFRAN) 4 MG tablet Take by mouth. (Patient not taking: Reported on 04/16/5954)   OneTouch Delica Lancets 38V MISC Use as directed to check blood sugars 2 times per day dx: e11.65   oxyCODONE-acetaminophen (PERCOCET) 7.5-325 MG tablet 1 tablet as needed (Patient not taking: Reported on 05/17/2020)   pravastatin (PRAVACHOL) 40 MG tablet TAKE 1 TABLET BY MOUTH EVERY DAY   predniSONE (DELTASONE) 10 MG tablet Take by mouth.  (Patient not taking: Reported  on 05/17/2020)   sulfamethoxazole-trimethoprim (BACTRIM DS) 800-160 MG tablet Take 1 tablet by mouth 2 (two) times daily. (Patient not taking: Reported on 05/17/2020)   telmisartan (MICARDIS) 80 MG tablet TAKE 1 TABLET BY MOUTH  DAILY   triamcinolone (KENALOG) 0.1 % APPLY TO AFFECTED AREA TWICE A DAY   No current facility-administered medications for this visit. (Other)      REVIEW OF SYSTEMS: ROS   Positive for: Genitourinary, Musculoskeletal, Endocrine, Eyes Negative for: Constitutional, Gastrointestinal, Neurological, Skin, HENT, Cardiovascular,  Respiratory, Psychiatric, Allergic/Imm, Heme/Lymph Last edited by Theodore Demark, COA on 06/30/2020  9:37 AM.        ALLERGIES No Known Allergies  PAST MEDICAL HISTORY Past Medical History:  Diagnosis Date   Arthritis    osteoarthritis   Cataract    OS   Diabetes mellitus    Insulin dependent   Diabetic retinopathy ([place])    NPDR OU   Glaucoma    POAG OD   H/O cardiovascular stress test    pt. reports 10/13- was normal per pt.    Hyperlipemia    Hypertension    Hypertensive retinopathy    OU   Past Surgical History:  Procedure Laterality Date   CARPAL TUNNEL RELEASE Bilateral    CATARACT EXTRACTION Right 10/05/2018   Dr. Kathlen Mody   EYE SURGERY Right    Cat Sx   KNEE ARTHROSCOPY     right   QUADRICEPS TENDON REPAIR  02/17/2012   Procedure: REPAIR QUADRICEP TENDON;  Surgeon: Vickey Huger, MD;  Location: [facility];  Service: Orthopedics;  Laterality: Right;  right quadriceps repair with latera release   QUADRICEPS TENDON REPAIR Right 05/15/2012   Procedure: REPAIR QUADRICEP TENDON;  Surgeon: Vickey Huger, MD;  Location: [facility];  Service: Orthopedics;  Laterality: Right;   TOTAL KNEE ARTHROPLASTY  10/21/2011   rt tk   TOTAL KNEE ARTHROPLASTY  10/21/2011   Procedure: TOTAL KNEE ARTHROPLASTY;  Surgeon: Rudean Haskell, MD;  Location: [facility];  Service: Orthopedics;  Laterality: Right;   TUBAL LIGATION      FAMILY HISTORY Family History  Problem Relation Age of Onset   Hypertension Mother    Heart Problems Father    Gout Father    Arthritis Father     SOCIAL HISTORY Social History   Tobacco Use   Smoking status: Former    Packs/day: 1.00    Years: 30.00    Pack years: 30.00    Types: Cigarettes    Start date: 01/15/1967    Quit date: 06/28/2014    Years since quitting: 6.0   Smokeless tobacco: Never  Vaping Use   Vaping Use: Never used  Substance Use Topics   Alcohol use: No   Drug use: Yes    Types: Hydrocodone         OPHTHALMIC EXAM:  Base Eye Exam      Visual Acuity (Snellen - Linear)       Right Left   Dist [place] 20/50 CF at 1'         Tonometry (Tonopen, 9:34 AM)       Right Left   Pressure 16 17         Pupils       Dark Light Shape React APD   Right 3 2 Round Brisk None   Left 3 2 Round Brisk None         Visual Fields (Counting fingers)       Left Right  Full   Restrictions Total superior temporal, inferior temporal, inferior nasal deficiencies; Partial outer superior nasal deficiency          Extraocular Movement       Right Left    Full Full         Neuro/Psych     Oriented x3: Yes   Mood/Affect: Normal         Dilation     Both eyes: 1.0% Mydriacyl, 2.5% Phenylephrine @ 9:33 AM           Slit Lamp and Fundus Exam     Slit Lamp Exam       Right Left   Lids/Lashes Dermatochalasis - upper lid, mild Meibomian gland dysfunction Dermatochalasis - upper lid, mild Meibomian gland dysfunction   Conjunctiva/Sclera Melanosis Melanosis   Cornea 2-3+ Punctate epithelial erosions, well healed temporal cataract wounds 3+ Punctate epithelial erosions, well healed temporal cataract wounds   Anterior Chamber Deep and quiet Deep and quiet   Iris Round and moderately dilated to 5.7m Round and moderately dilated to 627m  Lens PC IOL in good position, trace, cortical remnant temporally, PC folds 2-3+ Nuclear sclerosis, 2-3+ Cortical cataract   Vitreous Vitreous syneresis, vitreous condensations, Ozurdex pellet remnants inferiorly, no residual IVT Vitreous syneresis         Fundus Exam       Right Left   Disc  2-3+Pallor, Sharp rim, temporal PPP Mild Pallor, Sharp rim, PPP, severally attenuated vessels   C/D Ratio 0.5 0.3   Macula Blunted foveal reflex, [place], Retinal pigment epithelial mottling, temporal edema with focal exudates -- improved Flat, Blunted foveal reflex, RPE mottling and clumping, scarring, atrophy, no heme   Vessels attenuated, Tortuous Severe Vascular attenuation, +fibrosis    Periphery Attached, no heme    Attached, pavingstone and reticular degeneration inferiorly, scattered RPE atrophy, greatest peripapillary              IMAGING AND PROCEDURES  Imaging and Procedures for @TODAY @  OCT, Retina - OU - Both Eyes       Right Eye Quality was good. Central Foveal Thickness: 198. Progression has improved. Findings include abnormal foveal contour, intraretinal fluid, intraretinal hyper-reflective material, no SRF, vitreomacular adhesion (Mild interval improvement in temporal IRF).   Left Eye Quality was good. Central Foveal Thickness: 225. Progression has been stable. Findings include abnormal foveal contour, no IRF, no SRF, outer retinal atrophy, epiretinal membrane, preretinal fibrosis, subretinal hyper-reflective material, pigment epithelial detachment (Stable from prior).   Notes *Images captured and stored on drive  Diagnosis / Impression:  OD: CRVO w/ Mild interval improvement in temporal IRF OS: diffuse ORA - Stable from prior  Clinical management:  See below  Abbreviations: NFP - Normal foveal profile. CME - cystoid macular edema. PED - pigment epithelial detachment. IRF - intraretinal fluid. SRF - subretinal fluid. EZ - ellipsoid zone. ERM - epiretinal membrane. ORA - outer retinal atrophy. ORT - outer retinal tubulation. SRHM - subretinal hyper-reflective material      Intravitreal Injection, Pharmacologic Agent - OD - Right Eye       Time Out 06/30/2020. 10:55 AM. Confirmed correct patient, procedure, site, and patient consented.   Anesthesia Topical anesthesia was used. Anesthetic medications included Lidocaine 2%, Proparacaine 0.5%.   Procedure Preparation included 5% betadine to ocular surface, eyelid speculum. A 27 gauge needle was used.   Injection: 4 mg triamcinolone acetonide 40 MG/ML   Route: Intravitreal, Site: Right Eye   NDC: [id number]: [id number]  date: 12/13/2020   Post-op Post injection exam found visual  acuity of at least counting fingers. The patient tolerated the procedure well. There were no complications. The patient received written and verbal post procedure care education. Post injection medications were not given.   Notes An AC tap was performed following injection due to elevated IOP using a 30 gauge needle on a syringe with the plunger removed. The needle was placed at the limbus at 7 oclock and approximately 0.06 cc of aqueous was removed from the anterior chamber. Betadine was applied to the tap area before and after the paracentesis was performed. There were no complications. The patient tolerated the procedure well. The IOP was rechecked and was found to be ~10 mmHg by palpation.               ASSESSMENT/PLAN:    ICD-10-CM   1. Central retinal vein occlusion with macular edema of both eyes  H34.8130 Intravitreal Injection, Pharmacologic Agent - OD - Right Eye    triamcinolone acetonide (KENALOG-40) injection 4 mg    2. Moderate nonproliferative diabetic retinopathy of both eyes with macular edema associated with type 2 diabetes mellitus ([place])  T51.7616     3. Retinal edema  H35.81 OCT, Retina - OU - Both Eyes    4. Essential hypertension  I10     5. Hypertensive retinopathy of both eyes  H35.033     6. Combined forms of age-related cataract of left eye  H25.812     7. Pseudophakia  Z96.1     8. Primary open angle glaucoma (POAG) of right eye, mild stage  H40.1111       1. CRVO w/ CME OU  - pt of Dr. Zigmund Daniel who wished to transfer care  - OS long-standing low vision (HM) with subfoveal scar and central retinal atrophy  - OD with CME actively being managed with Ozurdex OD ~q5 wks by Dr. Zigmund Daniel (last on 8.20.20) s/p multiple IVTA and Ozurdex OD  - was due for Ozurdex OD w/ Zigmund Daniel on 9.25.20, but underwent cataract surgery OD w/ Kathlen Mody on 9.21.20  - s/p IVTA OD #4 (10.26.20), #5 (12.10.20), #6 (01.21.21), #7 (03.05.21), #8 (04.16.21), #9 (05.28.21), #10  (07.09.21), #11 (09.14.21), #12 (10.29.21), #13 (12.17.21), #14 (01.27.22), #15 (03.11.2022), #16 (05.04.22)  - BCVA stable at 20/50   - exam shows no residual IVTA in vitreous cavity  - OCT shows interval decrease in IRF ST macula and fovea at 6 wks  - recommend IVTA OD #17 today, 06.17.22  - pt wishes to proceed  - RBA of procedure discussed, questions answered  - informed consent obtained  - see procedure note -- AC tap  - f/u in 6 wks -- DFE/OCT/possible injection  2,3. Severe Non-proliferative diabetic retinopathy OU.  - OCT shows diabetic macular edema, OD   - recommend IVTA OD today, 06.17.22, as above  4,5. Hypertensive retinopathy OU  - discussed importance of tight BP control  - monitor   6. Mixed form age related cataract OS  - under the expert management of Dr. Kathlen Mody  7. Pseudophakia OD  - s/p CE/IOL OD (9.21.20) by expert surgeon, Dr. Kathlen Mody  - IOL in excellent position  - had post op IOP spike -- IOP now under better control  - monitor  8. POAG OD  - under the expert management of Dr. Kathlen Mody  - s/p SLT and goniotomy OD  - had post-cataract IOP spike  - IOP 16 today  - AV tap post  IVTA injection  Ophthalmic Meds Ordered this visit:  Meds ordered this encounter  Medications   triamcinolone acetonide (KENALOG-40) injection 4 mg        Return in about 6 weeks (around 08/11/2020) for f/u CRVO OU, DFE, OCT.  There are no Patient Instructions on file for this visit.   Explained the diagnoses, plan, and follow up with the patient and they expressed understanding.  Patient expressed understanding of the importance of proper follow up care.   This document serves as a record of services personally performed by Gardiner Sleeper, MD, PhD. It was created on their behalf by Leonie Douglas, an ophthalmic technician. The creation of this record is the provider's dictation and/or activities during the visit.    Electronically signed by: Leonie Douglas COA, 07/02/20  12:06  AM  This document serves as a record of services personally performed by Gardiner Sleeper, MD, PhD. It was created on their behalf by San Jetty. Owens Shark, OA an ophthalmic technician. The creation of this record is the provider's dictation and/or activities during the visit.    Electronically signed by: San Jetty. Marguerita Merles 06.17.2022 12:06 AM   Gardiner Sleeper, M.D., Ph.D. Diseases & Surgery of the Retina and [place] 06/30/2020  I have reviewed the above documentation for accuracy and completeness, and I agree with the above. Gardiner Sleeper, M.D., Ph.D. 07/02/20 12:06 AM   Abbreviations: M myopia (nearsighted); A astigmatism; H hyperopia (farsighted); P presbyopia; Mrx spectacle prescription;  CTL contact lenses; OD right eye; OS left eye; OU both eyes  XT exotropia; ET esotropia; PEK punctate epithelial keratitis; PEE punctate epithelial erosions; DES dry eye syndrome; MGD meibomian gland dysfunction; ATs artificial tears; PFAT's preservative free artificial tears; [place] nuclear sclerotic cataract; PSC posterior subcapsular cataract; ERM epi-retinal membrane; PVD posterior vitreous detachment; RD retinal detachment; DM diabetes mellitus; DR diabetic retinopathy; NPDR non-proliferative diabetic retinopathy; PDR proliferative diabetic retinopathy; CSME clinically significant macular edema; DME diabetic macular edema; dbh dot blot hemorrhages; CWS cotton wool spot; POAG primary open angle glaucoma; C/D cup-to-disc ratio; HVF humphrey visual field; GVF goldmann visual field; OCT optical coherence tomography; IOP intraocular pressure; BRVO Branch retinal vein occlusion; CRVO central retinal vein occlusion; CRAO central retinal artery occlusion; BRAO branch retinal artery occlusion; RT retinal tear; SB scleral buckle; PPV pars plana vitrectomy; VH Vitreous hemorrhage; PRP panretinal laser photocoagulation; IVK intravitreal kenalog; VMT vitreomacular traction; MH Macular hole;   NVD neovascularization of the disc; NVE neovascularization elsewhere; AREDS age related eye disease study; ARMD age related macular degeneration; POAG primary open angle glaucoma; EBMD epithelial/anterior basement membrane dystrophy; ACIOL anterior chamber intraocular lens; IOL intraocular lens; PCIOL posterior chamber intraocular lens; Phaco/IOL phacoemulsification with intraocular lens placement; [place] photorefractive keratectomy; LASIK laser assisted in situ keratomileusis; HTN hypertension; DM diabetes mellitus; COPD chronic obstructive pulmonary disease

## 2020-06-27 ENCOUNTER — Other Ambulatory Visit: Payer: Self-pay

## 2020-06-27 DIAGNOSIS — M170 Bilateral primary osteoarthritis of knee: Secondary | ICD-10-CM | POA: Diagnosis not present

## 2020-06-27 DIAGNOSIS — L89893 Pressure ulcer of other site, stage 3: Secondary | ICD-10-CM | POA: Diagnosis not present

## 2020-06-27 DIAGNOSIS — I129 Hypertensive chronic kidney disease with stage 1 through stage 4 chronic kidney disease, or unspecified chronic kidney disease: Secondary | ICD-10-CM | POA: Diagnosis not present

## 2020-06-27 DIAGNOSIS — R262 Difficulty in walking, not elsewhere classified: Secondary | ICD-10-CM | POA: Diagnosis not present

## 2020-06-27 DIAGNOSIS — N183 Chronic kidney disease, stage 3 unspecified: Secondary | ICD-10-CM | POA: Diagnosis not present

## 2020-06-27 DIAGNOSIS — I890 Lymphedema, not elsewhere classified: Secondary | ICD-10-CM | POA: Diagnosis not present

## 2020-06-27 DIAGNOSIS — L89623 Pressure ulcer of left heel, stage 3: Secondary | ICD-10-CM | POA: Diagnosis not present

## 2020-06-27 DIAGNOSIS — E1122 Type 2 diabetes mellitus with diabetic chronic kidney disease: Secondary | ICD-10-CM | POA: Diagnosis not present

## 2020-06-27 NOTE — Patient Outreach (Signed)
Aging Gracefully Program  06/27/2020  Mary [name] December 04, 1945 [id number]  [facility] Evaluation Interviewer made contact with patient. Aging Gracefully final survey completed.   Interviewer will send referral to Rod Holler with [facility] for follow up.  [facility] Care Management Assistant  [phone]

## 2020-06-28 DIAGNOSIS — R262 Difficulty in walking, not elsewhere classified: Secondary | ICD-10-CM | POA: Diagnosis not present

## 2020-06-28 DIAGNOSIS — L89893 Pressure ulcer of other site, stage 3: Secondary | ICD-10-CM | POA: Diagnosis not present

## 2020-06-28 DIAGNOSIS — I129 Hypertensive chronic kidney disease with stage 1 through stage 4 chronic kidney disease, or unspecified chronic kidney disease: Secondary | ICD-10-CM | POA: Diagnosis not present

## 2020-06-28 DIAGNOSIS — I890 Lymphedema, not elsewhere classified: Secondary | ICD-10-CM | POA: Diagnosis not present

## 2020-06-28 DIAGNOSIS — M170 Bilateral primary osteoarthritis of knee: Secondary | ICD-10-CM | POA: Diagnosis not present

## 2020-06-28 DIAGNOSIS — N183 Chronic kidney disease, stage 3 unspecified: Secondary | ICD-10-CM | POA: Diagnosis not present

## 2020-06-28 DIAGNOSIS — E1122 Type 2 diabetes mellitus with diabetic chronic kidney disease: Secondary | ICD-10-CM | POA: Diagnosis not present

## 2020-06-28 DIAGNOSIS — L89623 Pressure ulcer of left heel, stage 3: Secondary | ICD-10-CM | POA: Diagnosis not present

## 2020-06-29 DIAGNOSIS — L89893 Pressure ulcer of other site, stage 3: Secondary | ICD-10-CM | POA: Diagnosis not present

## 2020-06-29 DIAGNOSIS — L89623 Pressure ulcer of left heel, stage 3: Secondary | ICD-10-CM | POA: Diagnosis not present

## 2020-06-29 DIAGNOSIS — I129 Hypertensive chronic kidney disease with stage 1 through stage 4 chronic kidney disease, or unspecified chronic kidney disease: Secondary | ICD-10-CM | POA: Diagnosis not present

## 2020-06-29 DIAGNOSIS — M170 Bilateral primary osteoarthritis of knee: Secondary | ICD-10-CM | POA: Diagnosis not present

## 2020-06-29 DIAGNOSIS — N183 Chronic kidney disease, stage 3 unspecified: Secondary | ICD-10-CM | POA: Diagnosis not present

## 2020-06-29 DIAGNOSIS — E1122 Type 2 diabetes mellitus with diabetic chronic kidney disease: Secondary | ICD-10-CM | POA: Diagnosis not present

## 2020-06-29 DIAGNOSIS — R262 Difficulty in walking, not elsewhere classified: Secondary | ICD-10-CM | POA: Diagnosis not present

## 2020-06-29 DIAGNOSIS — I890 Lymphedema, not elsewhere classified: Secondary | ICD-10-CM | POA: Diagnosis not present

## 2020-06-30 ENCOUNTER — Other Ambulatory Visit: Payer: Self-pay

## 2020-06-30 ENCOUNTER — Encounter (INDEPENDENT_AMBULATORY_CARE_PROVIDER_SITE_OTHER): Payer: Self-pay | Admitting: Ophthalmology

## 2020-06-30 ENCOUNTER — Ambulatory Visit (INDEPENDENT_AMBULATORY_CARE_PROVIDER_SITE_OTHER): Payer: Medicare Other | Admitting: Ophthalmology

## 2020-06-30 DIAGNOSIS — H3581 Retinal edema: Secondary | ICD-10-CM | POA: Diagnosis not present

## 2020-06-30 DIAGNOSIS — H35033 Hypertensive retinopathy, bilateral: Secondary | ICD-10-CM

## 2020-06-30 DIAGNOSIS — H401111 Primary open-angle glaucoma, right eye, mild stage: Secondary | ICD-10-CM

## 2020-06-30 DIAGNOSIS — H34813 Central retinal vein occlusion, bilateral, with macular edema: Secondary | ICD-10-CM

## 2020-06-30 DIAGNOSIS — H25812 Combined forms of age-related cataract, left eye: Secondary | ICD-10-CM | POA: Diagnosis not present

## 2020-06-30 DIAGNOSIS — I10 Essential (primary) hypertension: Secondary | ICD-10-CM

## 2020-06-30 DIAGNOSIS — E113313 Type 2 diabetes mellitus with moderate nonproliferative diabetic retinopathy with macular edema, bilateral: Secondary | ICD-10-CM | POA: Diagnosis not present

## 2020-06-30 DIAGNOSIS — Z961 Presence of intraocular lens: Secondary | ICD-10-CM

## 2020-07-01 ENCOUNTER — Encounter (INDEPENDENT_AMBULATORY_CARE_PROVIDER_SITE_OTHER): Payer: Self-pay | Admitting: Ophthalmology

## 2020-07-01 MED ORDER — TRIAMCINOLONE ACETONIDE 40 MG/ML IJ SUSP FOR KALEIDOSCOPE
4.0000 mg | INTRAMUSCULAR | Status: AC | PRN
  Administered 2020-06-30: 4 mg via INTRAVITREAL

## 2020-07-04 DIAGNOSIS — I129 Hypertensive chronic kidney disease with stage 1 through stage 4 chronic kidney disease, or unspecified chronic kidney disease: Secondary | ICD-10-CM | POA: Diagnosis not present

## 2020-07-04 DIAGNOSIS — I890 Lymphedema, not elsewhere classified: Secondary | ICD-10-CM | POA: Diagnosis not present

## 2020-07-04 DIAGNOSIS — N183 Chronic kidney disease, stage 3 unspecified: Secondary | ICD-10-CM | POA: Diagnosis not present

## 2020-07-04 DIAGNOSIS — E1122 Type 2 diabetes mellitus with diabetic chronic kidney disease: Secondary | ICD-10-CM | POA: Diagnosis not present

## 2020-07-04 DIAGNOSIS — R262 Difficulty in walking, not elsewhere classified: Secondary | ICD-10-CM | POA: Diagnosis not present

## 2020-07-04 DIAGNOSIS — L89623 Pressure ulcer of left heel, stage 3: Secondary | ICD-10-CM | POA: Diagnosis not present

## 2020-07-04 DIAGNOSIS — M170 Bilateral primary osteoarthritis of knee: Secondary | ICD-10-CM | POA: Diagnosis not present

## 2020-07-04 DIAGNOSIS — L89893 Pressure ulcer of other site, stage 3: Secondary | ICD-10-CM | POA: Diagnosis not present

## 2020-07-06 DIAGNOSIS — L89892 Pressure ulcer of other site, stage 2: Secondary | ICD-10-CM | POA: Diagnosis not present

## 2020-07-06 DIAGNOSIS — N183 Chronic kidney disease, stage 3 unspecified: Secondary | ICD-10-CM

## 2020-07-06 DIAGNOSIS — R262 Difficulty in walking, not elsewhere classified: Secondary | ICD-10-CM | POA: Diagnosis not present

## 2020-07-06 DIAGNOSIS — M170 Bilateral primary osteoarthritis of knee: Secondary | ICD-10-CM | POA: Diagnosis not present

## 2020-07-06 DIAGNOSIS — E1122 Type 2 diabetes mellitus with diabetic chronic kidney disease: Secondary | ICD-10-CM | POA: Diagnosis not present

## 2020-07-06 DIAGNOSIS — I890 Lymphedema, not elsewhere classified: Secondary | ICD-10-CM | POA: Diagnosis not present

## 2020-07-06 DIAGNOSIS — I129 Hypertensive chronic kidney disease with stage 1 through stage 4 chronic kidney disease, or unspecified chronic kidney disease: Secondary | ICD-10-CM | POA: Diagnosis not present

## 2020-07-07 ENCOUNTER — Ambulatory Visit (INDEPENDENT_AMBULATORY_CARE_PROVIDER_SITE_OTHER): Payer: Medicare Other

## 2020-07-07 DIAGNOSIS — L89893 Pressure ulcer of other site, stage 3: Secondary | ICD-10-CM | POA: Diagnosis not present

## 2020-07-07 DIAGNOSIS — N183 Chronic kidney disease, stage 3 unspecified: Secondary | ICD-10-CM | POA: Diagnosis not present

## 2020-07-07 DIAGNOSIS — E1122 Type 2 diabetes mellitus with diabetic chronic kidney disease: Secondary | ICD-10-CM

## 2020-07-07 DIAGNOSIS — L89623 Pressure ulcer of left heel, stage 3: Secondary | ICD-10-CM | POA: Diagnosis not present

## 2020-07-07 NOTE — Patient Instructions (Signed)
Social Worker Visit Information  Goals we discussed today:   Goals Addressed             This Visit's Progress    work with SW to manage care coordination needs       Timeframe:  Long-Range Goal Priority:  High Start Date:  2.1.22                          Expected End Date: 5.2.22                       Next planned outreach: 8.24.22  Patient Goals/Self-Care Activities  patient will:   - Attend Ortho appointment on 7/6 -Purchase appropriate sized shoes to accommodate foot swelling -Contact SW as needed prior to next scheduled call          Follow Up Plan: SW will follow up with patient by phone over the next 60 days   Bevelyn Ngo, BSW, CDP Social Worker, Certified Dementia Practitioner [facility] / [facility] [phone]

## 2020-07-07 NOTE — Chronic Care Management (AMB) (Signed)
Chronic Care Management    Social Work Note  07/07/2020 Name: Mary [name] MRN: [id number] DOB: 07/07/1945  Mary [name] is a 75 y.o. year old female who is a primary care patient of Glendale Chard, MD. The CCM team was consulted to assist the patient with chronic disease management and/or care coordination needs related to: [employer] .   Engaged with patient by telephone for follow up visit in response to provider referral for social work chronic care management and care coordination services.   Consent to Services:  The patient was given information about Chronic Care Management services, agreed to services, and gave verbal consent prior to initiation of services.  Please see initial visit note for detailed documentation.   Patient agreed to services and consent obtained.   Assessment: Review of patient past medical history, allergies, medications, and health status, including review of relevant consultants reports was performed today as part of a comprehensive evaluation and provision of chronic care management and care coordination services.     SDOH (Social Determinants of Health) assessments and interventions performed:    Advanced Directives Status: Not addressed in this encounter.  CCM Care Plan  No Known Allergies  Outpatient Encounter Medications as of 07/07/2020  Medication Sig Note   aspirin 81 MG chewable tablet 1 tablet    Assure Comfort Lancets 28G MISC     Blood Glucose Monitoring Suppl ([place]) w/Device KIT Use as directed to check blood sugars 2 times per day dx: e11.65    cetirizine (ZYRTEC) 10 MG tablet TAKE 1 TABLET BY MOUTH EVERY DAY    Cyanocobalamin (VITAMIN B 12) 500 MCG TABS 1 tablet (Patient not taking: Reported on 05/17/2020)    Dexchlorpheniramine Maleate (RYCLORA) 2 MG/5ML SOLN 5cc twice daily as needed (Patient not taking: Reported on 05/17/2020)    diphenoxylate-atropine (LOMOTIL) 2.5-0.025 MG tablet Take by mouth.  (Patient not taking: Reported on 05/17/2020)    dorzolamide-timolol (COSOPT) 22.3-6.8 MG/ML ophthalmic solution Place 1 drop into the right eye 2 (two) times daily. (Patient not taking: Reported on 05/17/2020)    fluticasone (FLONASE) 50 MCG/ACT nasal spray SPRAY 1 SPRAY INTO EACH NOSTRIL EVERY DAY    furosemide (LASIX) 40 MG tablet TAKE 1 TABLET BY MOUTH  DAILY    glucose blood (ONETOUCH VERIO) test strip Use as directed to check blood sugars 2 times per day dx: e11.65    HYDROcodone-acetaminophen (NORCO) 10-325 MG tablet Take 1 tablet by mouth 5 (five) times daily as needed.    KLOR-CON M20 20 MEQ tablet Take 20 mEq by mouth daily. with food (Patient not taking: Reported on 05/17/2020) 02/23/2018: Patient instructed to hold this medication due to abnormal Potassium level of 6.2 on 02/18/18, per MD, Dr. Glendale Chard. Patient was given verbal education related to complications from hypo and hyperkalemia and importance of following MD recommendations. Patient instructed to resume this medication when directed by PCP. Patient verbalizes understanding.    levothyroxine (SYNTHROID) 125 MCG tablet Take 125 mcg by mouth daily before breakfast.    magnesium oxide (MAG-OX) 400 MG tablet TAKE 1 TABLET BY MOUTH EVERY DAY IN THE EVENING    metoprolol succinate (TOPROL-XL) 50 MG 24 hr tablet TAKE 1 TABLET BY MOUTH  DAILY    omeprazole (PRILOSEC) 20 MG capsule TAKE 1 CAPSULE BY MOUTH  DAILY    ondansetron (ZOFRAN) 4 MG tablet Take by mouth. (Patient not taking: Reported on 02/17/5359)    OneTouch Delica Lancets 44R MISC Use as directed  to check blood sugars 2 times per day dx: e11.65    oxyCODONE-acetaminophen (PERCOCET) 7.5-325 MG tablet 1 tablet as needed (Patient not taking: Reported on 05/17/2020)    pravastatin (PRAVACHOL) 40 MG tablet TAKE 1 TABLET BY MOUTH EVERY DAY    predniSONE (DELTASONE) 10 MG tablet Take by mouth.  (Patient not taking: Reported on 05/17/2020)    sulfamethoxazole-trimethoprim (BACTRIM DS) 800-160 MG  tablet Take 1 tablet by mouth 2 (two) times daily. (Patient not taking: Reported on 05/17/2020)    telmisartan (MICARDIS) 80 MG tablet TAKE 1 TABLET BY MOUTH  DAILY    timolol (BETIMOL) 0.25 % ophthalmic solution Place 1 drop into the right eye 2 (two) times daily.    triamcinolone (KENALOG) 0.1 % APPLY TO AFFECTED AREA TWICE A DAY    No facility-administered encounter medications on file as of 07/07/2020.    Patient Active Problem List   Diagnosis Date Noted   Change in bowel habit 01/20/2020   Chronic anemia 01/20/2020   Colon cancer screening 01/20/2020   Diabetes mellitus with stage 3 chronic kidney disease ([place]) 02/21/2018   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   Primary osteoarthritis of both knees 02/21/2018   Class 3 severe obesity due to excess calories with serious comorbidity and body mass index (BMI) of 45.0 to 49.9 in adult [facility]) 02/21/2018   Skin ulcer of right foot with fat layer exposed ([place]) 02/18/2018   Morbid (severe) obesity due to excess calories ([place]) 11/18/2017   Knee pain 07/16/2015   Lateral dislocation of right patella 05/04/2015   Rupture of right quadriceps tendon 05/24/2014   S/P TKR (total knee replacement) 05/05/2012    Conditions to be addressed/monitored: DMII and CKD Stage III ; Limited access to caregiver  Care Plan : Social Work Care Plan  Updates made by [name] since 07/07/2020 12:00 AM     Problem: Care Coordination      Long-Range Goal: Identify resources to assist with care coordination needs   Start Date: 02/15/2020  Expected End Date: 05/15/2020  Recent Progress: On track  Priority: High  Note:   Current Barriers:  Limited social support Transportation ADL IADL limitations Chronic conditions including DM II, CKD III, and obesity which put patient at increased risk for hospitalization   Social Work Clinical Goal(s):  patient will work with SW to address concerns related to care coordination  Interventions: 1:1  collaboration with Glendale Chard, MD regarding development and update of comprehensive plan of care as evidenced by provider attestation and co-signature Inter-disciplinary care team collaboration (see longitudinal plan of care) Successful outbound call placed to the patient to assess for care coordination needs Discussed the patient continues to experience knee and leg pain - patient continues to be seen by pain management Patient reports she has an appointment with ortho on July 6 - no transportation barriers are identified at this time Discussed patients feet are very swollen which has impacted her ability to wear shoes - patient states her feet keep slipping off her wheelchair foot rests due to socks sliding Encouraged the patient to ask her daughter to purchase some slip on shoes like crocs from [employer] a few sizes larger than normal that the patient can wear in her chair - patient agreed she will ask her daughter for help Scheduled follow up call over the next 60 days  Patient Goals/Self-Care Activities  patient will:   -  Attend Ortho appointment on 7/6 -Purchase appropriate sized shoes to accommodate foot swelling -Contact SW as  needed prior to next scheduled call  Follow up Plan: SW will follow up with patient by phone over the next 60 days       Follow Up Plan: SW will follow up with patient by phone over the next 60 days      [name], BSW, CDP Social Worker, Certified Dementia Practitioner [facility] [phone]  Total time spent performing care coordination and/or care management activities with the patient by phone or face to face = 30 minutes.

## 2020-07-10 DIAGNOSIS — M170 Bilateral primary osteoarthritis of knee: Secondary | ICD-10-CM | POA: Diagnosis not present

## 2020-07-10 DIAGNOSIS — I890 Lymphedema, not elsewhere classified: Secondary | ICD-10-CM | POA: Diagnosis not present

## 2020-07-10 DIAGNOSIS — L89892 Pressure ulcer of other site, stage 2: Secondary | ICD-10-CM | POA: Diagnosis not present

## 2020-07-10 DIAGNOSIS — I129 Hypertensive chronic kidney disease with stage 1 through stage 4 chronic kidney disease, or unspecified chronic kidney disease: Secondary | ICD-10-CM | POA: Diagnosis not present

## 2020-07-10 DIAGNOSIS — R262 Difficulty in walking, not elsewhere classified: Secondary | ICD-10-CM | POA: Diagnosis not present

## 2020-07-10 DIAGNOSIS — E1122 Type 2 diabetes mellitus with diabetic chronic kidney disease: Secondary | ICD-10-CM | POA: Diagnosis not present

## 2020-07-10 DIAGNOSIS — N183 Chronic kidney disease, stage 3 unspecified: Secondary | ICD-10-CM | POA: Diagnosis not present

## 2020-07-11 DIAGNOSIS — N183 Chronic kidney disease, stage 3 unspecified: Secondary | ICD-10-CM | POA: Diagnosis not present

## 2020-07-11 DIAGNOSIS — R262 Difficulty in walking, not elsewhere classified: Secondary | ICD-10-CM | POA: Diagnosis not present

## 2020-07-11 DIAGNOSIS — I129 Hypertensive chronic kidney disease with stage 1 through stage 4 chronic kidney disease, or unspecified chronic kidney disease: Secondary | ICD-10-CM | POA: Diagnosis not present

## 2020-07-11 DIAGNOSIS — E1122 Type 2 diabetes mellitus with diabetic chronic kidney disease: Secondary | ICD-10-CM | POA: Diagnosis not present

## 2020-07-11 DIAGNOSIS — I890 Lymphedema, not elsewhere classified: Secondary | ICD-10-CM | POA: Diagnosis not present

## 2020-07-11 DIAGNOSIS — L89892 Pressure ulcer of other site, stage 2: Secondary | ICD-10-CM | POA: Diagnosis not present

## 2020-07-11 DIAGNOSIS — M170 Bilateral primary osteoarthritis of knee: Secondary | ICD-10-CM | POA: Diagnosis not present

## 2020-07-12 ENCOUNTER — Other Ambulatory Visit: Payer: Self-pay

## 2020-07-12 DIAGNOSIS — I890 Lymphedema, not elsewhere classified: Secondary | ICD-10-CM | POA: Diagnosis not present

## 2020-07-12 DIAGNOSIS — E1122 Type 2 diabetes mellitus with diabetic chronic kidney disease: Secondary | ICD-10-CM | POA: Diagnosis not present

## 2020-07-12 DIAGNOSIS — I129 Hypertensive chronic kidney disease with stage 1 through stage 4 chronic kidney disease, or unspecified chronic kidney disease: Secondary | ICD-10-CM | POA: Diagnosis not present

## 2020-07-12 DIAGNOSIS — M170 Bilateral primary osteoarthritis of knee: Secondary | ICD-10-CM | POA: Diagnosis not present

## 2020-07-12 DIAGNOSIS — R262 Difficulty in walking, not elsewhere classified: Secondary | ICD-10-CM | POA: Diagnosis not present

## 2020-07-12 DIAGNOSIS — L89892 Pressure ulcer of other site, stage 2: Secondary | ICD-10-CM | POA: Diagnosis not present

## 2020-07-12 DIAGNOSIS — N183 Chronic kidney disease, stage 3 unspecified: Secondary | ICD-10-CM | POA: Diagnosis not present

## 2020-07-12 MED ORDER — ONETOUCH VERIO VI STRP: ORAL_STRIP | 3 refills | Status: DC

## 2020-07-18 DIAGNOSIS — I890 Lymphedema, not elsewhere classified: Secondary | ICD-10-CM | POA: Diagnosis not present

## 2020-07-18 DIAGNOSIS — N183 Chronic kidney disease, stage 3 unspecified: Secondary | ICD-10-CM | POA: Diagnosis not present

## 2020-07-18 DIAGNOSIS — M170 Bilateral primary osteoarthritis of knee: Secondary | ICD-10-CM | POA: Diagnosis not present

## 2020-07-18 DIAGNOSIS — R262 Difficulty in walking, not elsewhere classified: Secondary | ICD-10-CM | POA: Diagnosis not present

## 2020-07-18 DIAGNOSIS — E1122 Type 2 diabetes mellitus with diabetic chronic kidney disease: Secondary | ICD-10-CM | POA: Diagnosis not present

## 2020-07-18 DIAGNOSIS — L89892 Pressure ulcer of other site, stage 2: Secondary | ICD-10-CM | POA: Diagnosis not present

## 2020-07-18 DIAGNOSIS — I129 Hypertensive chronic kidney disease with stage 1 through stage 4 chronic kidney disease, or unspecified chronic kidney disease: Secondary | ICD-10-CM | POA: Diagnosis not present

## 2020-07-19 DIAGNOSIS — M25561 Pain in right knee: Secondary | ICD-10-CM | POA: Diagnosis not present

## 2020-07-20 DIAGNOSIS — L89892 Pressure ulcer of other site, stage 2: Secondary | ICD-10-CM | POA: Diagnosis not present

## 2020-07-20 DIAGNOSIS — M170 Bilateral primary osteoarthritis of knee: Secondary | ICD-10-CM | POA: Diagnosis not present

## 2020-07-20 DIAGNOSIS — I129 Hypertensive chronic kidney disease with stage 1 through stage 4 chronic kidney disease, or unspecified chronic kidney disease: Secondary | ICD-10-CM | POA: Diagnosis not present

## 2020-07-20 DIAGNOSIS — E1122 Type 2 diabetes mellitus with diabetic chronic kidney disease: Secondary | ICD-10-CM | POA: Diagnosis not present

## 2020-07-20 DIAGNOSIS — I890 Lymphedema, not elsewhere classified: Secondary | ICD-10-CM | POA: Diagnosis not present

## 2020-07-20 DIAGNOSIS — N183 Chronic kidney disease, stage 3 unspecified: Secondary | ICD-10-CM | POA: Diagnosis not present

## 2020-07-20 DIAGNOSIS — R262 Difficulty in walking, not elsewhere classified: Secondary | ICD-10-CM | POA: Diagnosis not present

## 2020-07-22 ENCOUNTER — Other Ambulatory Visit: Payer: Self-pay

## 2020-07-22 ENCOUNTER — Emergency Department (HOSPITAL_COMMUNITY)
Admission: EM | Admit: 2020-07-22 | Discharge: 2020-07-23 | Disposition: A | Discharge status: Home / Self Care | Payer: Medicare Other | Attending: Emergency Medicine | Admitting: Emergency Medicine

## 2020-07-22 ENCOUNTER — Encounter (HOSPITAL_COMMUNITY): Payer: Self-pay

## 2020-07-22 DIAGNOSIS — N390 Urinary tract infection, site not specified: Secondary | ICD-10-CM | POA: Insufficient documentation

## 2020-07-22 DIAGNOSIS — R6889 Other general symptoms and signs: Secondary | ICD-10-CM | POA: Diagnosis not present

## 2020-07-22 DIAGNOSIS — E11319 Type 2 diabetes mellitus with unspecified diabetic retinopathy without macular edema: Secondary | ICD-10-CM | POA: Insufficient documentation

## 2020-07-22 DIAGNOSIS — Z79899 Other long term (current) drug therapy: Secondary | ICD-10-CM | POA: Insufficient documentation

## 2020-07-22 DIAGNOSIS — Z87891 Personal history of nicotine dependence: Secondary | ICD-10-CM | POA: Insufficient documentation

## 2020-07-22 DIAGNOSIS — R609 Edema, unspecified: Secondary | ICD-10-CM | POA: Diagnosis not present

## 2020-07-22 DIAGNOSIS — W010XXA Fall on same level from slipping, tripping and stumbling without subsequent striking against object, initial encounter: Secondary | ICD-10-CM | POA: Insufficient documentation

## 2020-07-22 DIAGNOSIS — R42 Dizziness and giddiness: Secondary | ICD-10-CM | POA: Insufficient documentation

## 2020-07-22 DIAGNOSIS — R0902 Hypoxemia: Secondary | ICD-10-CM | POA: Diagnosis not present

## 2020-07-22 DIAGNOSIS — Z96651 Presence of right artificial knee joint: Secondary | ICD-10-CM | POA: Insufficient documentation

## 2020-07-22 DIAGNOSIS — I890 Lymphedema, not elsewhere classified: Secondary | ICD-10-CM | POA: Diagnosis not present

## 2020-07-22 DIAGNOSIS — Z7982 Long term (current) use of aspirin: Secondary | ICD-10-CM | POA: Insufficient documentation

## 2020-07-22 DIAGNOSIS — N183 Chronic kidney disease, stage 3 unspecified: Secondary | ICD-10-CM | POA: Diagnosis not present

## 2020-07-22 DIAGNOSIS — I129 Hypertensive chronic kidney disease with stage 1 through stage 4 chronic kidney disease, or unspecified chronic kidney disease: Secondary | ICD-10-CM | POA: Insufficient documentation

## 2020-07-22 DIAGNOSIS — R404 Transient alteration of awareness: Secondary | ICD-10-CM | POA: Diagnosis not present

## 2020-07-22 DIAGNOSIS — E1122 Type 2 diabetes mellitus with diabetic chronic kidney disease: Secondary | ICD-10-CM | POA: Insufficient documentation

## 2020-07-22 DIAGNOSIS — Z743 Need for continuous supervision: Secondary | ICD-10-CM | POA: Diagnosis not present

## 2020-07-22 LAB — COMPREHENSIVE METABOLIC PANEL
ALT: 16 U/L (ref 0–44)
AST: 35 U/L (ref 15–41)
Albumin: 4 g/dL (ref 3.5–5.0)
Alkaline Phosphatase: 85 U/L (ref 38–126)
Anion gap: 11 (ref 5–15)
BUN: 17 mg/dL (ref 8–23)
CO2: 25 mmol/L (ref 22–32)
Calcium: 9.1 mg/dL (ref 8.9–10.3)
Chloride: 101 mmol/L (ref 98–111)
Creatinine, Ser: 1.21 mg/dL — ABNORMAL HIGH (ref 0.44–1.00)
GFR, Estimated: 48 mL/min — ABNORMAL LOW (ref >60)
Glucose, Bld: 101 mg/dL — ABNORMAL HIGH (ref 70–99)
Potassium: 3.4 mmol/L — ABNORMAL LOW (ref 3.5–5.1)
Sodium: 137 mmol/L (ref 135–145)
Total Bilirubin: 1.3 mg/dL — ABNORMAL HIGH (ref 0.3–1.2)
Total Protein: 8.3 g/dL — ABNORMAL HIGH (ref 6.5–8.1)

## 2020-07-22 LAB — CBC WITH DIFFERENTIAL/PLATELET
Abs Immature Granulocytes: 0.01 10*3/uL (ref 0.00–0.07)
Basophils Absolute: 0 10*3/uL (ref 0.0–0.1)
Basophils Relative: 0 %
Eosinophils Absolute: 0 10*3/uL (ref 0.0–0.5)
Eosinophils Relative: 0 %
HCT: 35.3 % — ABNORMAL LOW (ref 36.0–46.0)
Hemoglobin: 11.8 g/dL — ABNORMAL LOW (ref 12.0–15.0)
Immature Granulocytes: 0 %
Lymphocytes Relative: 30 %
Lymphs Abs: 1.1 10*3/uL (ref 0.7–4.0)
MCH: 30.8 pg (ref 26.0–34.0)
MCHC: 33.4 g/dL (ref 30.0–36.0)
MCV: 92.2 fL (ref 80.0–100.0)
Monocytes Absolute: 0.4 10*3/uL (ref 0.1–1.0)
Monocytes Relative: 11 %
Neutro Abs: 2.2 10*3/uL (ref 1.7–7.7)
Neutrophils Relative %: 59 %
Platelets: 188 10*3/uL (ref 150–400)
RBC: 3.83 MIL/uL — ABNORMAL LOW (ref 3.87–5.11)
RDW: 12.6 % (ref 11.5–15.5)
WBC: 3.8 10*3/uL — ABNORMAL LOW (ref 4.0–10.5)
nRBC: 0 % (ref 0.0–0.2)

## 2020-07-22 LAB — URINALYSIS, ROUTINE W REFLEX MICROSCOPIC
Bacteria, UA: NONE SEEN
Bilirubin Urine: NEGATIVE
Glucose, UA: NEGATIVE mg/dL
Ketones, ur: NEGATIVE mg/dL
Leukocytes,Ua: NEGATIVE
Nitrite: NEGATIVE
Protein, ur: >=300 mg/dL — AB
Specific Gravity, Urine: 1.011 (ref 1.005–1.030)
pH: 7 (ref 5.0–8.0)

## 2020-07-22 MED ORDER — CEPHALEXIN 500 MG PO CAPS
500.0000 mg | ORAL_CAPSULE | Freq: Four times a day (QID) | ORAL | 0 refills | Status: DC
Start: 2020-07-22 — End: 2020-07-27

## 2020-07-22 NOTE — ED Triage Notes (Signed)
Pt BIB EMS from home. Pt tripped and fell unto her walker. Pt reports increasing issues with right leg, but endorses increased pain after fall. Pt also endorses dizziness x2 days. Pt reports being compliant with BP medication. Denies hitting her head, LOC or taking blood thinners.   BP 208/98 CBG 117 HR 74

## 2020-07-22 NOTE — ED Provider Notes (Signed)
[place] DEPT Provider Note   CSN: [id number] Arrival date & time: 07/22/20  1910     History Chief Complaint  Patient presents with   Fall   Dizziness    Mary [name] is a 75 y.o. female.  75 year old female who presents with dizziness x2 days.  States that it is worse when she moves her head backwards.  Denies any headache associated with it.  No fever or vomiting.  No trouble with her gait.  She chronically uses a walker.  Did trip over her walker exam fall but denies any injury from that.  Has had some urinary frequency but denies any urgency.  No flank pain.      Past Medical History:  Diagnosis Date   Arthritis    osteoarthritis   Cataract    OS   Diabetes mellitus    Insulin dependent   Diabetic retinopathy (HCC)    NPDR OU   Glaucoma    POAG OD   H/O cardiovascular stress test    pt. reports 10/13- was normal per pt.    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   Primary osteoarthritis of both knees 02/21/2018   Class 3 severe obesity due to excess calories with serious comorbidity and body mass index (BMI) of 45.0 to 49.9 in adult ([place]) 02/21/2018   Skin ulcer of right foot with fat layer exposed ([place]) 02/18/2018   Morbid (severe) obesity due to excess calories ([place]) 11/18/2017   Knee pain 07/16/2015   Lateral dislocation of right patella 05/04/2015   Rupture of right quadriceps tendon 05/24/2014   S/P TKR (total knee replacement) 05/05/2012    Past Surgical History:  Procedure Laterality Date   CARPAL TUNNEL RELEASE Bilateral    CATARACT EXTRACTION Right 10/05/2018   Dr. Kathlen Mody   EYE SURGERY Right    Cat Sx   KNEE  ARTHROSCOPY     right   QUADRICEPS TENDON REPAIR  02/17/2012   Procedure: REPAIR QUADRICEP TENDON;  Surgeon: Vickey Huger, MD;  Location: [place];  Service: Orthopedics;  Laterality: Right;  right quadriceps repair with latera release   QUADRICEPS TENDON REPAIR Right 05/15/2012   Procedure: REPAIR QUADRICEP TENDON;  Surgeon: Vickey Huger, MD;  Location: [place];  Service: Orthopedics;  Laterality: Right;   TOTAL KNEE ARTHROPLASTY  10/21/2011   rt tk   TOTAL KNEE ARTHROPLASTY  10/21/2011   Procedure: TOTAL KNEE ARTHROPLASTY;  Surgeon: Rudean Haskell, MD;  Location: [employer];  Service: Orthopedics;  Laterality: Right;   TUBAL LIGATION       OB History   No obstetric history on file.     Family History  Problem Relation Age of Onset   Hypertension Mother    Heart Problems Father    Gout Father    Arthritis Father     Social History   Tobacco Use   Smoking status: Former    Packs/day: 1.00    Years: 30.00    Pack years: 30.00    Types: Cigarettes    Start date: 01/15/1967    Quit date: 06/28/2014    Years since quitting: 6.0   Smokeless tobacco: Never  Vaping Use   Vaping Use: Never used  Substance Use Topics   Alcohol use: No   Drug use: Yes    Types: Hydrocodone    Home Medications Prior to Admission medications   Medication Sig Start Date End Date Taking? Authorizing Provider  aspirin 81 MG chewable tablet 1 tablet    [provider]  Assure Comfort Lancets 28G [place]  05/17/14   [provider]  Blood Glucose Monitoring Suppl ([place]) w/Device KIT Use as directed to check blood sugars 2 times per day dx: e11.65 10/19/19   Glendale Chard, MD  cetirizine (ZYRTEC) 10 MG tablet TAKE 1 TABLET BY MOUTH EVERY DAY 06/01/20   Glendale Chard, MD  Cyanocobalamin (VITAMIN B 12) 500 MCG TABS 1 tablet Patient not taking: Reported on 05/17/2020    [provider]  Dexchlorpheniramine Maleate [facility]) 2 MG/5ML SOLN 5cc twice daily as needed Patient not  taking: Reported on 05/17/2020 11/29/19   Glendale Chard, MD  diphenoxylate-atropine (LOMOTIL) 2.5-0.025 MG tablet Take by mouth. Patient not taking: Reported on 05/17/2020 01/17/20   [provider]  dorzolamide-timolol (COSOPT) 22.3-6.8 MG/ML ophthalmic solution Place 1 drop into the right eye 2 (two) times daily. Patient not taking: Reported on 05/17/2020 03/01/19   [provider]  fluticasone (FLONASE) 50 MCG/ACT nasal spray SPRAY 1 SPRAY INTO EACH NOSTRIL EVERY DAY 10/30/19   Glendale Chard, MD  furosemide (LASIX) 40 MG tablet TAKE 1 TABLET BY MOUTH  DAILY 06/05/20   Glendale Chard, MD  glucose blood (ONETOUCH VERIO) test strip Use as directed to check blood sugars 2 times per day dx: e11.65 07/12/20   Glendale Chard, MD  HYDROcodone-acetaminophen (NORCO) 10-325 MG tablet Take 1 tablet by mouth 5 (five) times daily as needed. 11/27/18   [provider]  KLOR-CON M20 20 MEQ tablet Take 20 mEq by mouth daily. with food Patient not taking: Reported on 05/17/2020 10/15/16   [provider]  levothyroxine (SYNTHROID) 125 MCG tablet Take 125 mcg by mouth daily before breakfast.    [provider]  magnesium oxide (MAG-OX) 400 MG tablet TAKE 1 TABLET BY MOUTH EVERY DAY IN THE EVENING 02/03/20   Glendale Chard, MD  metoprolol succinate (TOPROL-XL) 50 MG 24 hr tablet TAKE 1 TABLET BY MOUTH  DAILY 06/05/20   Glendale Chard, MD  omeprazole (PRILOSEC) 20 MG capsule TAKE 1 CAPSULE BY MOUTH  DAILY 11/01/19   Glendale Chard, MD  ondansetron (ZOFRAN) 4 MG tablet Take by mouth. Patient not taking: Reported on 05/17/2020 01/11/20   [provider]  OneTouch Delica Lancets 18A MISC Use as directed to check blood sugars 2 times per day dx: e11.65 10/19/19   Glendale Chard, MD  oxyCODONE-acetaminophen (PERCOCET) 7.5-325 MG tablet 1 tablet as needed Patient not taking: Reported on 05/17/2020    [provider]  pravastatin (PRAVACHOL) 40 MG tablet TAKE 1 TABLET BY MOUTH  EVERY DAY 03/27/20   Glendale Chard, MD  predniSONE (DELTASONE) 10 MG tablet Take by mouth.  Patient not taking: Reported on 05/17/2020 07/09/19   [provider]  sulfamethoxazole-trimethoprim (BACTRIM DS) 800-160 MG tablet Take 1 tablet by mouth 2 (two) times daily. Patient not taking: Reported on 05/17/2020 01/20/20   Landis Martins, DPM  telmisartan (MICARDIS) 80 MG tablet TAKE 1 TABLET BY MOUTH  DAILY 12/06/19   Glendale Chard, MD  timolol (BETIMOL) 0.25 % ophthalmic solution Place 1 drop into the right eye 2 (two) times daily.    [provider]  triamcinolone (KENALOG) 0.1 % APPLY TO AFFECTED AREA TWICE A DAY 12/06/19   Glendale Chard, MD    Allergies    Patient has no known allergies.  Review of Systems   Review of Systems  All other systems reviewed and are negative.  Physical Exam Updated Vital Signs BP (!) 165/113 (BP Location: Right Wrist)   Pulse 71   Temp 99 F (37.2 C) (Oral)   Resp 16   Ht 1.702 m (5' 7" )   Wt 136.1 kg   SpO2 100%   BMI 46.99 kg/m   Physical Exam Vitals and nursing note reviewed.  Constitutional:      General: She is not in acute distress.    Appearance: Normal appearance. She is well-developed. She is not toxic-appearing.  HENT:     Head: Normocephalic and atraumatic.  Eyes:     General: Lids are normal.     Conjunctiva/sclera: Conjunctivae normal.     Pupils: Pupils are equal, round, and reactive to light.  Neck:     Thyroid: No thyroid mass.     Trachea: No tracheal deviation.  Cardiovascular:     Rate and Rhythm: Normal rate and regular rhythm.     Heart sounds: Normal heart sounds. No murmur heard.   No gallop.  Pulmonary:     Effort: Pulmonary effort is normal. No respiratory distress.     Breath sounds: Normal breath sounds. No stridor. No decreased breath sounds, wheezing, rhonchi or rales.  Abdominal:     General: There is no distension.     Palpations: Abdomen is soft.     Tenderness: There is no abdominal  tenderness. There is no rebound.  Musculoskeletal:        General: No tenderness. Normal range of motion.     Cervical back: Normal range of motion and neck supple.  Lymphadenopathy:     Comments: Chronic bilateral lower extremity lymphedema  Skin:    General: Skin is warm and dry.     Findings: No abrasion or rash.  Neurological:     Mental Status: She is alert and oriented to person, place, and time. Mental status is at baseline.     GCS: GCS eye subscore is 4. GCS verbal subscore is 5. GCS motor subscore is 6.     Cranial Nerves: Cranial nerves are intact. No cranial nerve deficit.     Sensory: No sensory deficit.     Motor: Motor function is intact.  Psychiatric:        Attention and Perception: Attention normal.        Speech: Speech normal.        Behavior: Behavior normal.    ED Results / Procedures / Treatments   Labs (all labs ordered are listed, but only abnormal results are displayed) Labs Reviewed  URINE CULTURE  URINALYSIS, ROUTINE W REFLEX MICROSCOPIC  CBC WITH DIFFERENTIAL/PLATELET  COMPREHENSIVE METABOLIC PANEL    EKG None  Radiology No results found.  Procedures Procedures   Medications Ordered in ED Medications - No data to display  ED Course  I have reviewed the triage vital signs and the nursing notes.  Pertinent labs & imaging results that were available during my care of the patient were reviewed by me and considered in my medical decision making (see chart for details).    MDM Rules/Calculators/A&P  Urinalysis shows infection.  Will place antibiotics.  Other labs are reassuring.  Will discharge. Final Clinical Impression(s) / ED Diagnoses Final diagnoses:  None    Rx / DC Orders ED Discharge Orders     None        Lacretia Leigh, MD 07/22/20 2259

## 2020-07-23 ENCOUNTER — Other Ambulatory Visit: Payer: Self-pay | Admitting: Internal Medicine

## 2020-07-23 DIAGNOSIS — Z7401 Bed confinement status: Secondary | ICD-10-CM | POA: Diagnosis not present

## 2020-07-23 DIAGNOSIS — R6889 Other general symptoms and signs: Secondary | ICD-10-CM | POA: Diagnosis not present

## 2020-07-23 DIAGNOSIS — Z743 Need for continuous supervision: Secondary | ICD-10-CM | POA: Diagnosis not present

## 2020-07-23 DIAGNOSIS — W19XXXA Unspecified fall, initial encounter: Secondary | ICD-10-CM | POA: Diagnosis not present

## 2020-07-23 NOTE — ED Notes (Signed)
[facility] called for pt transport home.  

## 2020-07-24 ENCOUNTER — Ambulatory Visit (INDEPENDENT_AMBULATORY_CARE_PROVIDER_SITE_OTHER): Payer: Medicare Other

## 2020-07-24 DIAGNOSIS — E1122 Type 2 diabetes mellitus with diabetic chronic kidney disease: Secondary | ICD-10-CM

## 2020-07-24 DIAGNOSIS — N183 Chronic kidney disease, stage 3 unspecified: Secondary | ICD-10-CM

## 2020-07-24 LAB — URINE CULTURE: Culture: NO GROWTH

## 2020-07-24 NOTE — Chronic Care Management (AMB) (Signed)
Chronic Care Management    Social Work Note  07/24/2020 Name: Mary [name] MRN: [id number] DOB: 1945/02/16  Mary [name] is a 75 y.o. year old female who is a primary care patient of Glendale Chard, MD. The CCM team was consulted to assist the patient with chronic disease management and/or care coordination needs related to: Level of Care Concerns.   Engaged with patients daughter by phone  for follow up visit in response to provider referral for social work chronic care management and care coordination services.   Consent to Services:  The patient was given information about Chronic Care Management services, agreed to services, and gave verbal consent prior to initiation of services.  Please see initial visit note for detailed documentation.   Patient agreed to services and consent obtained.   Assessment: Review of patient past medical history, allergies, medications, and health status, including review of relevant consultants reports was performed today as part of a comprehensive evaluation and provision of chronic care management and care coordination services.     SDOH (Social Determinants of Health) assessments and interventions performed:    Advanced Directives Status: Not addressed in this encounter.  CCM Care Plan  No Known Allergies  Outpatient Encounter Medications as of 07/24/2020  Medication Sig Note   aspirin 81 MG chewable tablet 1 tablet    Assure Comfort Lancets 28G MISC     Blood Glucose Monitoring Suppl (ONETOUCH VERIO FLEX SYSTEM) w/Device KIT Use as directed to check blood sugars 2 times per day dx: e11.65    cephALEXin (KEFLEX) 500 MG capsule Take 1 capsule (500 mg total) by mouth 4 (four) times daily.    cetirizine (ZYRTEC) 10 MG tablet TAKE 1 TABLET BY MOUTH EVERY DAY    Cyanocobalamin (VITAMIN B 12) 500 MCG TABS 1 tablet (Patient not taking: Reported on 05/17/2020)    Dexchlorpheniramine Maleate (RYCLORA) 2 MG/5ML SOLN 5cc twice daily as needed (Patient  not taking: Reported on 05/17/2020)    diphenoxylate-atropine (LOMOTIL) 2.5-0.025 MG tablet Take by mouth. (Patient not taking: Reported on 05/17/2020)    dorzolamide-timolol (COSOPT) 22.3-6.8 MG/ML ophthalmic solution Place 1 drop into the right eye 2 (two) times daily. (Patient not taking: Reported on 05/17/2020)    fluticasone (FLONASE) 50 MCG/ACT nasal spray SPRAY 1 SPRAY INTO EACH NOSTRIL EVERY DAY    furosemide (LASIX) 40 MG tablet TAKE 1 TABLET BY MOUTH  DAILY    glucose blood (ONETOUCH VERIO) test strip Use as directed to check blood sugars 2 times per day dx: e11.65    HYDROcodone-acetaminophen (NORCO) 10-325 MG tablet Take 1 tablet by mouth 5 (five) times daily as needed.    KLOR-CON M20 20 MEQ tablet Take 20 mEq by mouth daily. with food (Patient not taking: Reported on 05/17/2020) 02/23/2018: Patient instructed to hold this medication due to abnormal Potassium level of 6.2 on 02/18/18, per MD, Dr. Glendale Chard. Patient was given verbal education related to complications from hypo and hyperkalemia and importance of following MD recommendations. Patient instructed to resume this medication when directed by PCP. Patient verbalizes understanding.    levothyroxine (SYNTHROID) 125 MCG tablet Take 125 mcg by mouth daily before breakfast.    magnesium oxide (MAG-OX) 400 MG tablet TAKE 1 TABLET BY MOUTH EVERY DAY IN THE EVENING    metoprolol succinate (TOPROL-XL) 50 MG 24 hr tablet TAKE 1 TABLET BY MOUTH  DAILY    omeprazole (PRILOSEC) 20 MG capsule TAKE 1 CAPSULE BY MOUTH  DAILY    ondansetron (ZOFRAN)  4 MG tablet Take by mouth. (Patient not taking: Reported on 09/20/6732)    OneTouch Delica Lancets 19F MISC Use as directed to check blood sugars 2 times per day dx: e11.65    oxyCODONE-acetaminophen (PERCOCET) 7.5-325 MG tablet 1 tablet as needed (Patient not taking: Reported on 05/17/2020)    pravastatin (PRAVACHOL) 40 MG tablet TAKE 1 TABLET BY MOUTH EVERY DAY    predniSONE (DELTASONE) 10 MG tablet Take by  mouth.  (Patient not taking: Reported on 05/17/2020)    sulfamethoxazole-trimethoprim (BACTRIM DS) 800-160 MG tablet Take 1 tablet by mouth 2 (two) times daily. (Patient not taking: Reported on 05/17/2020)    telmisartan (MICARDIS) 80 MG tablet TAKE 1 TABLET BY MOUTH  DAILY    timolol (BETIMOL) 0.25 % ophthalmic solution Place 1 drop into the right eye 2 (two) times daily.    triamcinolone (KENALOG) 0.1 % APPLY TO AFFECTED AREA TWICE A DAY    No facility-administered encounter medications on file as of 07/24/2020.    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   Primary osteoarthritis of both knees 02/21/2018   Class 3 severe obesity due to excess calories with serious comorbidity and body mass index (BMI) of 45.0 to 49.9 in adult [facility]) 02/21/2018   Skin ulcer of right foot with fat layer exposed ([place]) 02/18/2018   Morbid (severe) obesity due to excess calories ([place]) 11/18/2017   Knee pain 07/16/2015   Lateral dislocation of right patella 05/04/2015   Rupture of right quadriceps tendon 05/24/2014   S/P TKR (total knee replacement) 05/05/2012    Conditions to be addressed/monitored: DMII and CKD Stage III ; Level of care concerns  Care Plan : Social Work Care Plan  Updates made by [name] since 07/24/2020 12:00 AM     Problem: Long-Term Care Planning      Goal: Effective Long-Term Care Planning   Start Date: 07/24/2020  Expected End Date: 09/07/2020  Priority: High  Note:   Current Barriers:  Chronic disease management support and education needs related to DM and CKD Stage III   Level of care concerns  Social Worker Clinical Goal(s):  patient will work with SW to identify and address any acute and/or chronic care coordination needs related to the self  health management of DM and CKD Stage III   Patient and her daughter will follow up with primary care provider regarding placement needs as directed by SW  SW Interventions:  Inter-disciplinary care team collaboration (see longitudinal plan of care) Collaboration with Glendale Chard, MD regarding development and update of comprehensive plan of care as evidenced by provider attestation and co-signature Inbound call received from the patients daughter Mary [name] who indicates patient is in need of long term care Discussed the patient has become incontinent of both bowel and bladder, is depressed, and had a fall Saturday which led to an ED visit. Mary [name] indicates it is hard to care for the patient in the home Educated [name] on the process for long term care placement  Patient is actively receiving [employer] Medicaid Assessed for patient goals - Mary [name] reports she spoke with the patient yesterday along with other family members and the patient did agree to placement Discussed barriers to placement if the patient changes her mind due to patients cognitive level -  [name] stated understanding Advised [name] the patients provider would have to complete an FL2 form. Considering the patient has not been seen since March she may need an office visit Collaboration with Dr. Baird Cancer to advise of placement desire and determine if an office visit is needed Scheduled follow up call over the next 21 days  Patient Goals/Self-Care Activities patient will: With the help of her daughter  -  Follow up with primary care provider regarding need for placement -Work with SW to identify placement options  Follow Up Plan:  SW will follow up with the patient over the next 21 days       Follow Up Plan: SW will follow up with patient by phone over the next 21 days      [name], BSW, CDP Social Worker, Certified Dementia Practitioner [place] [facility] [place] [facility] [phone]

## 2020-07-24 NOTE — Patient Instructions (Signed)
Social Worker Visit Information  Goals we discussed today:   Goals Addressed             This Visit's Progress    Effective Long Term Care Planning       Timeframe:  Short-Term Goal Priority:  High Start Date:   7.11.22                          Expected End Date:  8.25.22                     Patient Goals/Self-Care Activities patient will: With the help of her daughter  - Follow up with primary care provider regarding need for placement -Work with SW to identify placement options          Materials Provided: Verbal education about placement provided by phone  Follow Up Plan: SW will follow up with patient by phone over the next 21 days   Bevelyn Ngo, BSW, CDP Social Worker, Certified Dementia Practitioner [facility] Care Management [phone]

## 2020-07-25 ENCOUNTER — Telehealth: Payer: Self-pay

## 2020-07-25 DIAGNOSIS — I890 Lymphedema, not elsewhere classified: Secondary | ICD-10-CM | POA: Diagnosis not present

## 2020-07-25 DIAGNOSIS — I129 Hypertensive chronic kidney disease with stage 1 through stage 4 chronic kidney disease, or unspecified chronic kidney disease: Secondary | ICD-10-CM | POA: Diagnosis not present

## 2020-07-25 DIAGNOSIS — R262 Difficulty in walking, not elsewhere classified: Secondary | ICD-10-CM | POA: Diagnosis not present

## 2020-07-25 DIAGNOSIS — E1122 Type 2 diabetes mellitus with diabetic chronic kidney disease: Secondary | ICD-10-CM | POA: Diagnosis not present

## 2020-07-25 DIAGNOSIS — L89892 Pressure ulcer of other site, stage 2: Secondary | ICD-10-CM | POA: Diagnosis not present

## 2020-07-25 DIAGNOSIS — N183 Chronic kidney disease, stage 3 unspecified: Secondary | ICD-10-CM | POA: Diagnosis not present

## 2020-07-25 DIAGNOSIS — M170 Bilateral primary osteoarthritis of knee: Secondary | ICD-10-CM | POA: Diagnosis not present

## 2020-07-25 NOTE — Telephone Encounter (Signed)
Transition Care Management Follow-up Telephone Call Date of discharge and from where: [facility] discharged on 07/22/2020 How have you been since you were released from the hospital? Pt said she is feeling much better since discharge, does not have the dizzy spells.  Any questions or concerns? No  Items Reviewed: Did the pt receive and understand the discharge instructions provided? Yes  Medications obtained and verified? Yes  Other? No  Any new allergies since your discharge? No  Dietary orders reviewed? Yes Do you have support at home? Yes   Home Care and Equipment/Supplies: Were home health services ordered? not applicable If so, what is the name of the agency? N/a   Has the agency set up a time to come to the patient's home? not applicable Were any new equipment or medical supplies ordered?  No What is the name of the medical supply agency? N/a  Were you able to get the supplies/equipment? not applicable Do you have any questions related to the use of the equipment or supplies? No  Functional Questionnaire: (I = Independent and D = Dependent) ADLs: I  Bathing/Dressing- I  Meal Prep- I  Eating- I  Maintaining continence- I  Transferring/Ambulation- I  Managing Meds- I  Follow up appointments reviewed:  PCP Hospital f/u appt confirmed? Yes  Scheduled to see Dorothyann Peng on 07/25/2020 @ [address] [facility] Specialist Hospital f/u appt confirmed? No  Scheduled to see n/a  on n/a  @ n/a . Are transportation arrangements needed? No  If their condition worsens, is the pt aware to call PCP or go to the Emergency Dept.? Yes Was the patient provided with contact information for the PCP's office or ED? Yes Was to pt encouraged to call back with questions or concerns? Yes

## 2020-07-26 ENCOUNTER — Telehealth: Payer: Self-pay

## 2020-07-26 NOTE — Telephone Encounter (Signed)
The pt was scheduled placed back on the schedule for tomorrow, the appt that she originally cancelled. The pt said that someone from [employer] called and confirmed that she needed a ride to her appt tomorrow, so she will have transportation.

## 2020-07-26 NOTE — Telephone Encounter (Signed)
-----   Message from Dorothyann Peng, MD sent at 07/24/2020  6:09 PM EDT ----- Please get her on schedule for next week. Let's look at schedule together.   RS ----- Message ----- From: Bevelyn Ngo Sent: 07/24/2020   3:32 PM EDT To: Dorothyann Peng, MD, Mariam Dollar, CMA, #  Hey Dr. Allyne Gee! I just got a call from patients daughter [name]. She is requesting assistance with placement. Stated Mrs.Ricciuti is incontinent of both bowel and bladder, fell over the weekend and went to the ED, and also experiencing some depression. I let her know since Mrs. Roca has not had an OV since March you most likely need to see her. Was not sure if that would need to be in person or virtual. Can you have someone give her a call to schedule? [name] requested you call her at [phone].  Thank you, Enrique Sack

## 2020-07-27 ENCOUNTER — Other Ambulatory Visit: Payer: Self-pay

## 2020-07-27 ENCOUNTER — Ambulatory Visit: Payer: Medicare Other | Admitting: Internal Medicine

## 2020-07-27 ENCOUNTER — Ambulatory Visit (INDEPENDENT_AMBULATORY_CARE_PROVIDER_SITE_OTHER): Payer: Medicare Other | Admitting: Internal Medicine

## 2020-07-27 ENCOUNTER — Encounter: Payer: Self-pay | Admitting: Internal Medicine

## 2020-07-27 ENCOUNTER — Ambulatory Visit: Payer: Self-pay

## 2020-07-27 VITALS — BP 126/88 | HR 70 | Temp 98.6°F | Ht 67.0 in

## 2020-07-27 DIAGNOSIS — R42 Dizziness and giddiness: Secondary | ICD-10-CM

## 2020-07-27 DIAGNOSIS — E1122 Type 2 diabetes mellitus with diabetic chronic kidney disease: Secondary | ICD-10-CM

## 2020-07-27 DIAGNOSIS — M170 Bilateral primary osteoarthritis of knee: Secondary | ICD-10-CM | POA: Diagnosis not present

## 2020-07-27 DIAGNOSIS — E890 Postprocedural hypothyroidism: Secondary | ICD-10-CM | POA: Diagnosis not present

## 2020-07-27 DIAGNOSIS — N1831 Chronic kidney disease, stage 3a: Secondary | ICD-10-CM

## 2020-07-27 DIAGNOSIS — I890 Lymphedema, not elsewhere classified: Secondary | ICD-10-CM

## 2020-07-27 DIAGNOSIS — Z79899 Other long term (current) drug therapy: Secondary | ICD-10-CM | POA: Diagnosis not present

## 2020-07-27 DIAGNOSIS — I129 Hypertensive chronic kidney disease with stage 1 through stage 4 chronic kidney disease, or unspecified chronic kidney disease: Secondary | ICD-10-CM

## 2020-07-27 DIAGNOSIS — E669 Obesity, unspecified: Secondary | ICD-10-CM

## 2020-07-27 DIAGNOSIS — Z1211 Encounter for screening for malignant neoplasm of colon: Secondary | ICD-10-CM

## 2020-07-27 DIAGNOSIS — N183 Chronic kidney disease, stage 3 unspecified: Secondary | ICD-10-CM

## 2020-07-27 DIAGNOSIS — L89892 Pressure ulcer of other site, stage 2: Secondary | ICD-10-CM | POA: Diagnosis not present

## 2020-07-27 DIAGNOSIS — Z6841 Body Mass Index (BMI) 40.0 and over, adult: Secondary | ICD-10-CM

## 2020-07-27 DIAGNOSIS — M79604 Pain in right leg: Secondary | ICD-10-CM

## 2020-07-27 DIAGNOSIS — R262 Difficulty in walking, not elsewhere classified: Secondary | ICD-10-CM | POA: Diagnosis not present

## 2020-07-27 NOTE — Patient Instructions (Signed)
Social Worker Visit Information  Goals we discussed today:   Goals Addressed             This Visit's Progress    Effective Long Term Care Planning       Timeframe:  Short-Term Goal Priority:  High Start Date:   7.11.22                          Expected End Date:  8.25.22                     Patient Goals/Self-Care Activities patient will: With the help of her daughter  - Follow up with primary care provider regarding need for placement -Work with SW to identify placement options -Engage with [facility] to become more knowledgeable of program         Materials Provided: Yes: written resource information re: [facility]  Follow Up Plan: SW will follow up with patient by phone over the next month   Bevelyn Ngo, BSW, CDP Social Worker, Certified Dementia Practitioner [facility] Care Management [phone]

## 2020-07-27 NOTE — Patient Instructions (Signed)

## 2020-07-27 NOTE — Chronic Care Management (AMB) (Signed)
Chronic Care Management    Social Work Note  07/27/2020 Name: Mary [name] MRN: [id number] DOB: Oct 17, 1945  Mary [name] is a 75 y.o. year old female who is a primary care patient of Mary Chard, MD. The CCM team was consulted to assist the patient with chronic disease management and/or care coordination needs related to: Level of Care Concerns.   Collaboration with Dr. Baird Cancer  for  case discussion  in response to provider referral for social work chronic care management and care coordination services.   Consent to Services:  The patient was given information about Chronic Care Management services, agreed to services, and gave verbal consent prior to initiation of services.  Please see initial visit note for detailed documentation.   Patient agreed to services and consent obtained.   Assessment: Review of patient past medical history, allergies, medications, and health status, including review of relevant consultants reports was performed today as part of a comprehensive evaluation and provision of chronic care management and care coordination services.     SDOH (Social Determinants of Health) assessments and interventions performed:    Advanced Directives Status: Not addressed in this encounter.  CCM Care Plan  No Known Allergies  Outpatient Encounter Medications as of 07/27/2020  Medication Sig Note   aspirin 81 MG chewable tablet 1 tablet    Assure Comfort Lancets 28G MISC     Blood Glucose Monitoring Suppl (ONETOUCH VERIO FLEX SYSTEM) w/Device KIT Use as directed to check blood sugars 2 times per day dx: e11.65    cephALEXin (KEFLEX) 500 MG capsule Take 500 mg by mouth.    fluticasone (FLONASE) 50 MCG/ACT nasal spray SPRAY 1 SPRAY INTO EACH NOSTRIL EVERY DAY    furosemide (LASIX) 40 MG tablet TAKE 1 TABLET BY MOUTH  DAILY    glucose blood (ONETOUCH VERIO) test strip Use as directed to check blood sugars 2 times per day dx: e11.65    HYDROcodone-acetaminophen (NORCO)  10-325 MG tablet Take 1 tablet by mouth 5 (five) times daily as needed.    KLOR-CON M20 20 MEQ tablet Take 20 mEq by mouth daily. with food 02/23/2018: Patient instructed to hold this medication due to abnormal Potassium level of 6.2 on 02/18/18, per MD, Dr. Glendale [name]. Patient was given verbal education related to complications from hypo and hyperkalemia and importance of following MD recommendations. Patient instructed to resume this medication when directed by PCP. Patient verbalizes understanding.    levothyroxine (SYNTHROID) 125 MCG tablet Take 125 mcg by mouth daily before breakfast.    magnesium oxide (MAG-OX) 400 MG tablet TAKE 1 TABLET BY MOUTH EVERY DAY IN THE EVENING    metoprolol succinate (TOPROL-XL) 50 MG 24 hr tablet TAKE 1 TABLET BY MOUTH  DAILY    omeprazole (PRILOSEC) 20 MG capsule TAKE 1 CAPSULE BY MOUTH  DAILY    OneTouch Delica Lancets 30Q MISC Use as directed to check blood sugars 2 times per day dx: e11.65    pravastatin (PRAVACHOL) 40 MG tablet TAKE 1 TABLET BY MOUTH EVERY DAY    telmisartan (MICARDIS) 80 MG tablet TAKE 1 TABLET BY MOUTH  DAILY    timolol (BETIMOL) 0.25 % ophthalmic solution Place 1 drop into the right eye 2 (two) times daily.    triamcinolone (KENALOG) 0.1 % APPLY TO AFFECTED AREA TWICE A DAY    No facility-administered encounter medications on file as of 07/27/2020.    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   Primary osteoarthritis of both knees 02/21/2018   Class 3 severe obesity due to excess calories with serious comorbidity and body mass index (BMI) of 45.0 to 49.9 in adult [facility]) 02/21/2018   Skin ulcer of right foot with fat layer exposed ([place]) 02/18/2018   Morbid (severe) obesity due to excess calories ([place]) 11/18/2017    Knee pain 07/16/2015   Lateral dislocation of right patella 05/04/2015   Rupture of right quadriceps tendon 05/24/2014   S/P TKR (total knee replacement) 05/05/2012    Conditions to be addressed/monitored: DMII and CKD Stage III ; Level of care concerns  Care Plan : Social Work Care Plan  Updates made by [name] since 07/27/2020 12:00 AM     Problem: Long-Term Care Planning      Goal: Effective Long-Term Care Planning   Start Date: 07/24/2020  Expected End Date: 09/07/2020  Priority: High  Note:   Current Barriers:  Chronic disease management support and education needs related to DM and CKD Stage III   Level of care concerns  Social Worker Clinical Goal(s):  patient will work with SW to identify and address any acute and/or chronic care coordination needs related to the self health management of DM and CKD Stage III   Patient and her daughter will follow up with primary care provider regarding placement needs as directed by SW  SW Interventions:  Inter-disciplinary care team collaboration (see longitudinal plan of care) Collaboration with Mary Chard, MD regarding development and update of comprehensive plan of care as evidenced by provider attestation and co-signature Collaboration with Dr. Baird Cancer to discuss possible referral to [facility] to assist with patient care needs Provided written documentation to the patient regarding the [facility] program - patient is agreeable to learn more but is wary of losing Dr. Baird Cancer as primary care provider Patient agreeable to referral for more information Referral placed to [facility] with [facility] - provided contact information for the patient and her daughter  Patient Goals/Self-Care Activities patient will: With the help of her daughter  -  Follow up with primary care provider regarding need for placement -Work with SW to identify placement options -Engage with [facility] to become more knowledgeable of  program  Follow Up Plan:  SW will follow up with the patient over the next 21 days       Follow Up Plan: SW will follow up with patient by phone over the next month      [name], BSW, CDP Social Worker, Certified Dementia Practitioner [place] [facility] [place] [facility] [phone]

## 2020-07-27 NOTE — Progress Notes (Signed)
I,Katawbba Wiggins,acting as a Education administrator for Maximino Greenland, MD.,have documented all relevant documentation on the behalf of Maximino Greenland, MD,as directed by  Maximino Greenland, MD while in the presence of Maximino Greenland, MD.  This visit occurred during the SARS-CoV-2 public health emergency.  Safety protocols were in place, including screening questions prior to the visit, additional usage of staff PPE, and extensive cleaning of exam room while observing appropriate contact time as indicated for disinfecting solutions.  Subjective:     Patient ID: Mary [name] , female    DOB: January 08, 1946 , 75 y.o.   MRN: [id number]   Chief Complaint  Patient presents with   Diabetes   Hypertension    HPI  The patient is here today for a diabetes and hypertension follow-up.  She is no longer taking diabetes meds. She reports compliance with BP meds.   Had ER eval on 7/9 for dizziness. States she woke up and the room was spinning. She presented with dizziness x2 days.  States that it was worse when she moved her head backwards.  Denies any headache associated with it.  No fever or vomiting.  No trouble with her gait.  She chronically uses a walker.  Has had some urinary frequency.  No flank pain.  ER evaluation revealed UTI - she was treated with abx. She has not had any recurrence of sx.   Diabetes She presents for her follow-up diabetic visit. She has type 2 diabetes mellitus. Her disease course has been stable. Hypoglycemia symptoms include dizziness. Pertinent negatives for hypoglycemia include no headaches. Pertinent negatives for diabetes include no blurred vision and no chest pain. There are no hypoglycemic complications. Diabetic complications include nephropathy. She is following a diabetic diet.  Hyperlipidemia Pertinent negatives include no chest pain or shortness of breath.  Hypertension This is a chronic problem. The current episode started more than 1 year ago. Pertinent negatives include no  blurred vision, chest pain, headaches, orthopnea, palpitations or shortness of breath. Risk factors for coronary artery disease include diabetes mellitus, dyslipidemia, post-menopausal state and sedentary lifestyle.    Past Medical History:  Diagnosis Date   Arthritis    osteoarthritis   Cataract    OS   Diabetes mellitus    Insulin dependent   Diabetic retinopathy (HCC)    NPDR OU   Glaucoma    POAG OD   H/O cardiovascular stress test    pt. reports 10/13- was normal per pt.    Hyperlipemia    Hypertension    Hypertensive retinopathy    OU     Family History  Problem Relation Age of Onset   Hypertension Mother    Heart Problems Father    Gout Father    Arthritis Father      Current Outpatient Medications:    aspirin 81 MG chewable tablet, 1 tablet, Disp: , Rfl:    Assure Comfort Lancets 28G MISC, , Disp: , Rfl:    Blood Glucose Monitoring Suppl (ONETOUCH VERIO FLEX SYSTEM) w/Device KIT, Use as directed to check blood sugars 2 times per day dx: e11.65, Disp: 1 kit, Rfl: 1   cephALEXin (KEFLEX) 500 MG capsule, Take 500 mg by mouth., Disp: , Rfl:    fluticasone (FLONASE) 50 MCG/ACT nasal spray, SPRAY 1 SPRAY INTO EACH NOSTRIL EVERY DAY, Disp: 32 mL, Rfl: 1   furosemide (LASIX) 40 MG tablet, TAKE 1 TABLET BY MOUTH  DAILY, Disp: 90 tablet, Rfl: 3   glucose blood (ONETOUCH VERIO)  test strip, Use as directed to check blood sugars 2 times per day dx: e11.65, Disp: 100 each, Rfl: 3   HYDROcodone-acetaminophen (NORCO) 10-325 MG tablet, Take 1 tablet by mouth 5 (five) times daily as needed., Disp: , Rfl:    KLOR-CON M20 20 MEQ tablet, Take 20 mEq by mouth daily. with food, Disp: , Rfl: 1   levothyroxine (SYNTHROID) 125 MCG tablet, Take 125 mcg by mouth daily before breakfast., Disp: , Rfl:    magnesium oxide (MAG-OX) 400 MG tablet, TAKE 1 TABLET BY MOUTH EVERY DAY IN THE EVENING, Disp: 90 tablet, Rfl: 1   metoprolol succinate (TOPROL-XL) 50 MG 24 hr tablet, TAKE 1 TABLET BY MOUTH   DAILY, Disp: 90 tablet, Rfl: 3   OneTouch Delica Lancets 00F MISC, Use as directed to check blood sugars 2 times per day dx: e11.65, Disp: 100 each, Rfl: 3   pravastatin (PRAVACHOL) 40 MG tablet, TAKE 1 TABLET BY MOUTH EVERY DAY, Disp: 90 tablet, Rfl: 1   telmisartan (MICARDIS) 80 MG tablet, TAKE 1 TABLET BY MOUTH  DAILY, Disp: 90 tablet, Rfl: 3   timolol (BETIMOL) 0.25 % ophthalmic solution, Place 1 drop into the right eye 2 (two) times daily., Disp: , Rfl:    triamcinolone (KENALOG) 0.1 %, APPLY TO AFFECTED AREA TWICE A DAY, Disp: 30 g, Rfl: 0   omeprazole (PRILOSEC) 20 MG capsule, TAKE 1 CAPSULE BY MOUTH  DAILY, Disp: 90 capsule, Rfl: 3   No Known Allergies   Review of Systems  Constitutional: Negative.   Eyes:  Negative for blurred vision.  Respiratory: Negative.  Negative for shortness of breath.   Cardiovascular: Negative.  Negative for chest pain, palpitations and orthopnea.  Gastrointestinal: Negative.   Neurological:  Positive for dizziness. Negative for headaches.       Pt states dizziness has resolved.   Psychiatric/Behavioral: Negative.    All other systems reviewed and are negative.   Today's Vitals   07/27/20 1114  BP: 126/88  Pulse: 70  Temp: 98.6 F (37 C)  TempSrc: Oral  Height: 5' 7"  (1.702 m)  PainSc: 7   PainLoc: Leg   Body mass index is 46.99 kg/m.  Wt Readings from Last 3 Encounters:  07/22/20 300 lb (136.1 kg)  05/17/20 292 lb (132.5 kg)  09/02/19 300 lb (136.1 kg)    BP Readings from Last 3 Encounters:  07/27/20 126/88  07/23/20 (!) 150/53  03/27/20 124/76    Objective:  Physical Exam Vitals and nursing note reviewed.  Constitutional:      Appearance: Normal appearance. She is obese.  HENT:     Head: Normocephalic and atraumatic.     Comments: Strabismus     Nose:     Comments: Masked     Mouth/Throat:     Comments: Masked  Eyes:     Extraocular Movements: Extraocular movements intact.  Cardiovascular:     Rate and Rhythm: Normal rate  and regular rhythm.     Heart sounds: Normal heart sounds.  Pulmonary:     Effort: Pulmonary effort is normal.     Breath sounds: Normal breath sounds.  Chest:     Comments: Left clavicular swelling Musculoskeletal:     Cervical back: Normal range of motion.  Skin:    General: Skin is warm.  Neurological:     General: No focal deficit present.     Mental Status: She is alert.  Psychiatric:        Mood and Affect: Mood normal.  Behavior: Behavior normal.        Assessment And Plan:     1. Type 2 diabetes mellitus with stage 3a chronic kidney disease, without long-term current use of insulin (HCC) Comments: Chronic, I will check labs as listed below. She is no longer on meds.  She is encouraged to limit her intake of sweetened beverages.  - Hemoglobin A1c - Insulin, random(561)  2. Parenchymal renal hypertension, stage 1 through stage 4 or unspecified chronic kidney disease Comments: Chronic, fair control. She is aware of diastolic elevation. Advised to follow low sodium diet. I also offered info regarding PACE, she declines.   3. Postablative hypothyroidism Comments: I will check thyroid panel and adjust meds as needed.  - TSH + free T4  4. Lymphedema associated with obesity Comments: She declines referral to lymphedema clinic.   5. Right leg pain Comments: Has been evaluated by Dr. Rhona Raider who sent her back to Dr. Ronnie Derby for further evaluation. Pt advised she may need revision of R-TKR.   6. Dizziness Comments: ER records reviewed.  Sx appear to have resolved. She is encouraged to follow BP readings at home.  7. Class 3 severe obesity due to excess calories with serious comorbidity and body mass index (BMI) of 45.0 to 49.9 in adult [facility]) Comments: She is encouraged to initially strive to lose 10-15 pounds to decrease cardiac risk.  8. Screen for colon cancer Comments: She does not wish to have colonoscopy. I will send her for Cologuard testing. - Cologuard  9.  Drug therapy - Vitamin B12    Patient was given opportunity to ask questions. Patient verbalized understanding of the plan and was able to repeat key elements of the plan. All questions were answered to their satisfaction.   I, Maximino Greenland, MD, have reviewed all documentation for this visit. The documentation on 09/10/20 for the exam, diagnosis, procedures, and orders are all accurate and complete.   IF YOU HAVE BEEN REFERRED TO A SPECIALIST, IT MAY TAKE 1-2 WEEKS TO SCHEDULE/PROCESS THE REFERRAL. IF YOU HAVE NOT HEARD FROM US/SPECIALIST IN TWO WEEKS, PLEASE GIVE [place] A CALL AT [phone].   THE PATIENT IS ENCOURAGED TO PRACTICE SOCIAL DISTANCING DUE TO THE COVID-19 PANDEMIC.

## 2020-07-28 LAB — TSH+FREE T4
Free T4: 2.02 ng/dL — ABNORMAL HIGH (ref 0.82–1.77)
TSH: 3.87 u[IU]/mL (ref 0.450–4.500)

## 2020-07-28 LAB — HEMOGLOBIN A1C
Est. average glucose Bld gHb Est-mCnc: 100 mg/dL
Hgb A1c MFr Bld: 5.1 % (ref 4.8–5.6)

## 2020-07-28 LAB — VITAMIN B12: Vitamin B-12: >2000 pg/mL — ABNORMAL HIGH (ref 232–1245)

## 2020-07-28 LAB — INSULIN, RANDOM: INSULIN: 14.4 u[IU]/mL (ref 2.6–24.9)

## 2020-08-01 ENCOUNTER — Encounter: Payer: Self-pay | Admitting: Internal Medicine

## 2020-08-01 DIAGNOSIS — M170 Bilateral primary osteoarthritis of knee: Secondary | ICD-10-CM | POA: Diagnosis not present

## 2020-08-01 DIAGNOSIS — I129 Hypertensive chronic kidney disease with stage 1 through stage 4 chronic kidney disease, or unspecified chronic kidney disease: Secondary | ICD-10-CM | POA: Diagnosis not present

## 2020-08-01 DIAGNOSIS — E1122 Type 2 diabetes mellitus with diabetic chronic kidney disease: Secondary | ICD-10-CM | POA: Diagnosis not present

## 2020-08-01 DIAGNOSIS — I890 Lymphedema, not elsewhere classified: Secondary | ICD-10-CM | POA: Diagnosis not present

## 2020-08-01 DIAGNOSIS — R262 Difficulty in walking, not elsewhere classified: Secondary | ICD-10-CM | POA: Diagnosis not present

## 2020-08-01 DIAGNOSIS — L89892 Pressure ulcer of other site, stage 2: Secondary | ICD-10-CM | POA: Diagnosis not present

## 2020-08-01 DIAGNOSIS — N183 Chronic kidney disease, stage 3 unspecified: Secondary | ICD-10-CM | POA: Diagnosis not present

## 2020-08-02 ENCOUNTER — Ambulatory Visit: Payer: Self-pay

## 2020-08-02 ENCOUNTER — Telehealth: Payer: Medicare Other

## 2020-08-02 DIAGNOSIS — I129 Hypertensive chronic kidney disease with stage 1 through stage 4 chronic kidney disease, or unspecified chronic kidney disease: Secondary | ICD-10-CM

## 2020-08-02 DIAGNOSIS — E1122 Type 2 diabetes mellitus with diabetic chronic kidney disease: Secondary | ICD-10-CM

## 2020-08-02 DIAGNOSIS — I890 Lymphedema, not elsewhere classified: Secondary | ICD-10-CM

## 2020-08-02 DIAGNOSIS — N183 Chronic kidney disease, stage 3 unspecified: Secondary | ICD-10-CM

## 2020-08-02 DIAGNOSIS — E039 Hypothyroidism, unspecified: Secondary | ICD-10-CM

## 2020-08-03 DIAGNOSIS — R262 Difficulty in walking, not elsewhere classified: Secondary | ICD-10-CM | POA: Diagnosis not present

## 2020-08-03 DIAGNOSIS — M170 Bilateral primary osteoarthritis of knee: Secondary | ICD-10-CM | POA: Diagnosis not present

## 2020-08-03 DIAGNOSIS — L89892 Pressure ulcer of other site, stage 2: Secondary | ICD-10-CM | POA: Diagnosis not present

## 2020-08-03 DIAGNOSIS — N183 Chronic kidney disease, stage 3 unspecified: Secondary | ICD-10-CM | POA: Diagnosis not present

## 2020-08-03 DIAGNOSIS — I890 Lymphedema, not elsewhere classified: Secondary | ICD-10-CM | POA: Diagnosis not present

## 2020-08-03 DIAGNOSIS — I129 Hypertensive chronic kidney disease with stage 1 through stage 4 chronic kidney disease, or unspecified chronic kidney disease: Secondary | ICD-10-CM | POA: Diagnosis not present

## 2020-08-03 DIAGNOSIS — E1122 Type 2 diabetes mellitus with diabetic chronic kidney disease: Secondary | ICD-10-CM | POA: Diagnosis not present

## 2020-08-04 DIAGNOSIS — R262 Difficulty in walking, not elsewhere classified: Secondary | ICD-10-CM | POA: Diagnosis not present

## 2020-08-04 DIAGNOSIS — I890 Lymphedema, not elsewhere classified: Secondary | ICD-10-CM | POA: Diagnosis not present

## 2020-08-04 DIAGNOSIS — E1122 Type 2 diabetes mellitus with diabetic chronic kidney disease: Secondary | ICD-10-CM | POA: Diagnosis not present

## 2020-08-04 DIAGNOSIS — I129 Hypertensive chronic kidney disease with stage 1 through stage 4 chronic kidney disease, or unspecified chronic kidney disease: Secondary | ICD-10-CM | POA: Diagnosis not present

## 2020-08-04 DIAGNOSIS — M170 Bilateral primary osteoarthritis of knee: Secondary | ICD-10-CM | POA: Diagnosis not present

## 2020-08-04 DIAGNOSIS — L89892 Pressure ulcer of other site, stage 2: Secondary | ICD-10-CM | POA: Diagnosis not present

## 2020-08-04 DIAGNOSIS — N183 Chronic kidney disease, stage 3 unspecified: Secondary | ICD-10-CM | POA: Diagnosis not present

## 2020-08-08 DIAGNOSIS — M170 Bilateral primary osteoarthritis of knee: Secondary | ICD-10-CM | POA: Diagnosis not present

## 2020-08-08 DIAGNOSIS — M25561 Pain in right knee: Secondary | ICD-10-CM | POA: Diagnosis not present

## 2020-08-08 DIAGNOSIS — I890 Lymphedema, not elsewhere classified: Secondary | ICD-10-CM | POA: Diagnosis not present

## 2020-08-08 DIAGNOSIS — R262 Difficulty in walking, not elsewhere classified: Secondary | ICD-10-CM | POA: Diagnosis not present

## 2020-08-08 DIAGNOSIS — M25361 Other instability, right knee: Secondary | ICD-10-CM | POA: Diagnosis not present

## 2020-08-08 DIAGNOSIS — L89892 Pressure ulcer of other site, stage 2: Secondary | ICD-10-CM | POA: Diagnosis not present

## 2020-08-08 DIAGNOSIS — E1122 Type 2 diabetes mellitus with diabetic chronic kidney disease: Secondary | ICD-10-CM | POA: Diagnosis not present

## 2020-08-08 DIAGNOSIS — I129 Hypertensive chronic kidney disease with stage 1 through stage 4 chronic kidney disease, or unspecified chronic kidney disease: Secondary | ICD-10-CM | POA: Diagnosis not present

## 2020-08-08 DIAGNOSIS — N183 Chronic kidney disease, stage 3 unspecified: Secondary | ICD-10-CM | POA: Diagnosis not present

## 2020-08-08 DIAGNOSIS — Z96651 Presence of right artificial knee joint: Secondary | ICD-10-CM | POA: Diagnosis not present

## 2020-08-08 DIAGNOSIS — G8929 Other chronic pain: Secondary | ICD-10-CM | POA: Diagnosis not present

## 2020-08-08 NOTE — Progress Notes (Signed)
[facility] Note  08/11/2020     CHIEF COMPLAINT Patient presents for Retina Follow Up   HISTORY OF PRESENT ILLNESS: Mary [name] is a 75 y.o. female who presents to the clinic today for:   HPI     Retina Follow Up   Patient presents with  CRVO/BRVO.  In both eyes.  Duration of 6 weeks.  Since onset it is stable.  I, the attending physician,  performed the HPI with the patient and updated documentation appropriately.        Comments   Pt here for 6 wk ret f/u CRVO w/ edema OU. Pt states she is doing well, no vision changes or issues reported. Vision is the same. No ocular pain.       Last edited by Bernarda Caffey, MD on 08/11/2020 12:47 PM.      Referring physician: Glendale Chard, MD [address] [place],  [place] [address]  HISTORICAL INFORMATION:   Selected notes from the [place] Transferred care from Dr. Zigmund Daniel   CURRENT MEDICATIONS: Current Outpatient Medications (Ophthalmic Drugs)  Medication Sig   timolol (BETIMOL) 0.25 % ophthalmic solution Place 1 drop into the right eye 2 (two) times daily.   No current facility-administered medications for this visit. (Ophthalmic Drugs)   Current Outpatient Medications (Other)  Medication Sig   aspirin 81 MG chewable tablet 1 tablet   Assure Comfort Lancets 28G MISC    Blood Glucose Monitoring Suppl (ONETOUCH VERIO FLEX SYSTEM) w/Device KIT Use as directed to check blood sugars 2 times per day dx: e11.65   cephALEXin (KEFLEX) 500 MG capsule Take 500 mg by mouth.   fluticasone (FLONASE) 50 MCG/ACT nasal spray SPRAY 1 SPRAY INTO EACH NOSTRIL EVERY DAY   furosemide (LASIX) 40 MG tablet TAKE 1 TABLET BY MOUTH  DAILY   glucose blood (ONETOUCH VERIO) test strip Use as directed to check blood sugars 2 times per day dx: e11.65   HYDROcodone-acetaminophen (NORCO) 10-325 MG tablet Take 1 tablet by mouth 5 (five) times daily as needed.   KLOR-CON M20 20 MEQ tablet Take 20  mEq by mouth daily. with food   levothyroxine (SYNTHROID) 125 MCG tablet Take 125 mcg by mouth daily before breakfast.   magnesium oxide (MAG-OX) 400 MG tablet TAKE 1 TABLET BY MOUTH EVERY DAY IN THE EVENING   metoprolol succinate (TOPROL-XL) 50 MG 24 hr tablet TAKE 1 TABLET BY MOUTH  DAILY   omeprazole (PRILOSEC) 20 MG capsule TAKE 1 CAPSULE BY MOUTH  DAILY   OneTouch Delica Lancets 84Y MISC Use as directed to check blood sugars 2 times per day dx: e11.65   pravastatin (PRAVACHOL) 40 MG tablet TAKE 1 TABLET BY MOUTH EVERY DAY   telmisartan (MICARDIS) 80 MG tablet TAKE 1 TABLET BY MOUTH  DAILY   triamcinolone (KENALOG) 0.1 % APPLY TO AFFECTED AREA TWICE A DAY   No current facility-administered medications for this visit. (Other)      REVIEW OF SYSTEMS: ROS   Positive for: Genitourinary, Musculoskeletal, Endocrine, Eyes Negative for: Constitutional, Gastrointestinal, Neurological, Skin, HENT, Cardiovascular, Respiratory, Psychiatric, Allergic/Imm, Heme/Lymph Last edited by Kingsley Spittle, COT on 08/11/2020  9:24 AM.         ALLERGIES No Known Allergies  PAST MEDICAL HISTORY Past Medical History:  Diagnosis Date   Arthritis    osteoarthritis   Cataract    OS   Diabetes mellitus    Insulin dependent   Diabetic retinopathy ([place])  NPDR OU   Glaucoma    POAG OD   H/O cardiovascular stress test    pt. reports 10/13- was normal per pt.    Hyperlipemia    Hypertension    Hypertensive retinopathy    OU   Past Surgical History:  Procedure Laterality Date   CARPAL TUNNEL RELEASE Bilateral    CATARACT EXTRACTION Right 10/05/2018   Dr. Kathlen Mody   EYE SURGERY Right    Cat Sx   KNEE ARTHROSCOPY     right   QUADRICEPS TENDON REPAIR  02/17/2012   Procedure: REPAIR QUADRICEP TENDON;  Surgeon: Vickey Huger, MD;  Location: [facility];  Service: Orthopedics;  Laterality: Right;  right quadriceps repair with latera release   QUADRICEPS TENDON REPAIR Right 05/15/2012   Procedure:  REPAIR QUADRICEP TENDON;  Surgeon: Vickey Huger, MD;  Location: [facility];  Service: Orthopedics;  Laterality: Right;   TOTAL KNEE ARTHROPLASTY  10/21/2011   Procedure: TOTAL KNEE ARTHROPLASTY;  Surgeon: Rudean Haskell, MD;  Location: [facility];  Service: Orthopedics;  Laterality: Right;   TUBAL LIGATION      FAMILY HISTORY Family History  Problem Relation Age of Onset   Hypertension Mother    Heart Problems Father    Gout Father    Arthritis Father     SOCIAL HISTORY Social History   Tobacco Use   Smoking status: Former    Packs/day: 1.00    Years: 30.00    Pack years: 30.00    Types: Cigarettes    Start date: 01/15/1967    Quit date: 06/28/2014    Years since quitting: 6.1   Smokeless tobacco: Never  Vaping Use   Vaping Use: Never used  Substance Use Topics   Alcohol use: No   Drug use: Yes    Types: Hydrocodone         OPHTHALMIC EXAM:  Base Eye Exam     Visual Acuity (Snellen - Linear)       Right Left   Dist cc 20/50 +2 CF at 1'         Tonometry (Tonopen, 9:34 AM)       Right Left   Pressure 19 18         Pupils       Dark Light Shape React APD   Right 3 2 Round Minimal None   Left 3 2 Round Minimal None         Visual Fields (Counting fingers)       Left Right     Full   Restrictions Total superior temporal, inferior temporal, inferior nasal deficiencies; Partial outer superior nasal deficiency          Extraocular Movement       Right Left    Full, Ortho Full, Ortho         Neuro/Psych     Oriented x3: Yes   Mood/Affect: Normal         Dilation     Both eyes: 1.0% Mydriacyl, 2.5% Phenylephrine @ 9:35 AM           Slit Lamp and Fundus Exam     Slit Lamp Exam       Right Left   Lids/Lashes Dermatochalasis - upper lid, mild Meibomian gland dysfunction Dermatochalasis - upper lid, mild Meibomian gland dysfunction   Conjunctiva/Sclera Melanosis Melanosis   Cornea 2-3+ Punctate epithelial erosions, well healed  temporal cataract wounds 3+ Punctate epithelial erosions, well healed temporal cataract wounds   Anterior Chamber  Deep and quiet Deep and quiet   Iris Round and moderately dilated to 5.58m Round and moderately dilated to 650m  Lens PC IOL in good position, trace, cortical remnant temporally, PC folds 2-3+ Nuclear sclerosis, 2-3+ Cortical cataract   Vitreous Vitreous syneresis, vitreous condensations, Ozurdex pellet remnants inferiorly, no residual IVT Vitreous syneresis         Fundus Exam       Right Left   Disc  2-3+Pallor, Sharp rim, temporal PPP Mild Pallor, Sharp rim, PPP, severely attenuated vessels   C/D Ratio 0.5 0.3   Macula Blunted foveal reflex, [place], Retinal pigment epithelial mottling, temporal edema with focal exudates -- improved Flat, Blunted foveal reflex, RPE mottling and clumping, scarring, atrophy, no heme   Vessels attenuated, Tortuous Severe Vascular attenuation, +fibrosis   Periphery Attached, focal IRH temporal periphery  Attached, pavingstone and reticular degeneration inferiorly, scattered RPE atrophy, greatest peripapillary             Refraction     Wearing Rx       Sphere Cylinder Axis Add   Right -1.00 +0.75 180 +2.25   Left -0.50 +0.75 155 +2.25            IMAGING AND PROCEDURES  Imaging and Procedures for _0 @  OCT, Retina - OU - Both Eyes       Right Eye Quality was good. Central Foveal Thickness: 194. Progression has improved. Findings include abnormal foveal contour, intraretinal fluid, intraretinal hyper-reflective material, no SRF, vitreomacular adhesion (Interval improvement in temporal IRF/IRHM).   Left Eye Quality was good. Central Foveal Thickness: 223. Progression has been stable. Findings include abnormal foveal contour, no IRF, no SRF, outer retinal atrophy, epiretinal membrane, preretinal fibrosis, subretinal hyper-reflective material, pigment epithelial detachment (Stable from prior).   Notes *Images captured and  stored on drive  Diagnosis / Impression:  OD: Interval improvement in temporal IRF/IRHM OS: diffuse ORA - Stable from prior  Clinical management:  See below  Abbreviations: NFP - Normal foveal profile. CME - cystoid macular edema. PED - pigment epithelial detachment. IRF - intraretinal fluid. SRF - subretinal fluid. EZ - ellipsoid zone. ERM - epiretinal membrane. ORA - outer retinal atrophy. ORT - outer retinal tubulation. SRHM - subretinal hyper-reflective material      Intravitreal Injection, Pharmacologic Agent - OD - Right Eye       Time Out 08/11/2020. 10:39 AM. Confirmed correct patient, procedure, site, and patient consented.   Anesthesia Topical anesthesia was used. Anesthetic medications included Lidocaine 2%, Proparacaine 0.5%.   Procedure Preparation included 5% betadine to ocular surface, eyelid speculum. A 27 gauge needle was used.   Injection: 4 mg triamcinolone acetonide 40 MG/ML   Route: Intravitreal, Site: Right Eye   NDC: 72603-108-01, Lot: [id number] date: 11/14/2020   Post-op Post injection exam found visual acuity of at least counting fingers. The patient tolerated the procedure well. There were no complications. The patient received written and verbal post procedure care education. Post injection medications were not given.   Notes After successful induction of topical anesthesia with proparacaine drops, lidocaine gel, and 4% lidocaine solution over the inferotemporal quadrant on q-tips, the eye was prepped with Betadine.  Subsequently, 0.1 cc (4 mg) of Kenalog was injected into the vitreous cavity utilizing a 30-gauge needle.  There were no complications of the injection.   An AC tap was performed following injection due to elevated IOP using a 30 gauge needle on a syringe with the plunger removed. The needle  was placed at the limbus at 7 oclock and approximately 0.1 cc of aqueous was removed from the anterior chamber. Betadine was applied to the tap  area before and after the paracentesis was performed. There were no complications. The patient tolerated the procedure well. The IOP was rechecked and was found to be ~10 mmHg by palpation.  The central retinal artery was checked and found to be patent.  The kenalog was identified in the vitreous cavity.  The patient had no complications and the discharge instructions were reviewed.             ASSESSMENT/PLAN:    ICD-10-CM   1. Central retinal vein occlusion with macular edema of both eyes  H34.8130 Intravitreal Injection, Pharmacologic Agent - OD - Right Eye    triamcinolone acetonide (KENALOG-40) injection 4 mg    2. Moderate nonproliferative diabetic retinopathy of both eyes with macular edema associated with type 2 diabetes mellitus ([place])  F12.1975     3. Retinal edema  H35.81 OCT, Retina - OU - Both Eyes    4. Essential hypertension  I10     5. Hypertensive retinopathy of both eyes  H35.033     6. Combined forms of age-related cataract of left eye  H25.812     7. Pseudophakia  Z96.1     8. Primary open angle glaucoma (POAG) of right eye, mild stage  H40.1111      1. CRVO w/ CME OU  - pt of Dr. Zigmund Daniel who wished to transfer care  - OS long-standing low vision (CF) with subfoveal scar and central retinal atrophy  - OD with CME actively being managed with Ozurdex OD ~q5 wks by Dr. Zigmund Daniel (last on 8.20.20) s/p multiple IVTA and Ozurdex OD  - was due for Ozurdex OD w/ Zigmund Daniel on 9.25.20, but underwent cataract surgery OD w/ Kathlen Mody on 9.21.20  - s/p IVTA OD #4 (10.26.20), #5 (12.10.20), #6 (01.21.21), #7 (03.05.21), #8 (04.16.21), #9 (05.28.21), #10 (07.09.21), #11 (09.14.21), #12 (10.29.21), #13 (12.17.21), #14 (01.27.22), #15 (03.11.2022), #16 (05.04.22), #17 (06.17.22)  - BCVA stable at 20/50   - exam shows no residual IVTA in vitreous cavity  - OCT OD today shows interval improvement in temporal IRF/IRHM; OS - stable from prior  - recommend IVTA OD #18 today,  07.29.22  - pt wishes to proceed  - RBA of procedure discussed, questions answered  - informed consent obtained  - see procedure note -- AC tap  - f/u in 7 wks -- DFE/OCT/possible injection  2,3. Severe Non-proliferative diabetic retinopathy OU.  - OCT shows diabetic macular edema, OD   - recommend IVTA OD today, 7.29.22, as above  4,5. Hypertensive retinopathy OU  - discussed importance of tight BP control  - monitor   6. Mixed form age related cataract OS  - under the expert management of Dr. Kathlen Mody  7. Pseudophakia OD  - s/p CE/IOL OD (9.21.20) by expert surgeon, Dr. Kathlen Mody  - IOL in excellent position  - had post op IOP spike -- IOP now under better control  - monitor  8. POAG OD  - under the expert management of Dr. Kathlen Mody  - s/p SLT and goniotomy OD  - had post-cataract IOP spike  - IOP 19 today  - AC tap post IVTA injection  Ophthalmic Meds Ordered this visit:  Meds ordered this encounter  Medications   triamcinolone acetonide (KENALOG-40) injection 4 mg      Return in about 7 weeks (around 09/29/2020) for 7  wk f/u for CRVO w/CME OU w/DFE/OCT/poss inj..  There are no Patient Instructions on file for this visit.   This document serves as a record of services personally performed by Gardiner Sleeper, MD, PhD. It was created on their behalf by Estill Bakes, COT an ophthalmic technician. The creation of this record is the provider's dictation and/or activities during the visit.    Electronically signed by: Estill Bakes, COT 7.29.22 @ 12:51 PM   Gardiner Sleeper, M.D., Ph.D. Diseases & Surgery of the Retina and [place] 08/11/2020   I have reviewed the above documentation for accuracy and completeness, and I agree with the above. Gardiner Sleeper, M.D., Ph.D. 08/11/20 12:51 PM  Abbreviations: M myopia (nearsighted); A astigmatism; H hyperopia (farsighted); P presbyopia; Mrx spectacle prescription;  CTL contact lenses; OD right  eye; OS left eye; OU both eyes  XT exotropia; ET esotropia; PEK punctate epithelial keratitis; PEE punctate epithelial erosions; DES dry eye syndrome; MGD meibomian gland dysfunction; ATs artificial tears; PFAT's preservative free artificial tears; [place] nuclear sclerotic cataract; PSC posterior subcapsular cataract; ERM epi-retinal membrane; PVD posterior vitreous detachment; RD retinal detachment; DM diabetes mellitus; DR diabetic retinopathy; NPDR non-proliferative diabetic retinopathy; PDR proliferative diabetic retinopathy; CSME clinically significant macular edema; DME diabetic macular edema; dbh dot blot hemorrhages; CWS cotton wool spot; POAG primary open angle glaucoma; C/D cup-to-disc ratio; HVF humphrey visual field; GVF goldmann visual field; OCT optical coherence tomography; IOP intraocular pressure; BRVO Branch retinal vein occlusion; CRVO central retinal vein occlusion; CRAO central retinal artery occlusion; BRAO branch retinal artery occlusion; RT retinal tear; SB scleral buckle; PPV pars plana vitrectomy; VH Vitreous hemorrhage; PRP panretinal laser photocoagulation; IVK intravitreal kenalog; VMT vitreomacular traction; MH Macular hole;  NVD neovascularization of the disc; NVE neovascularization elsewhere; AREDS age related eye disease study; ARMD age related macular degeneration; POAG primary open angle glaucoma; EBMD epithelial/anterior basement membrane dystrophy; ACIOL anterior chamber intraocular lens; IOL intraocular lens; PCIOL posterior chamber intraocular lens; Phaco/IOL phacoemulsification with intraocular lens placement; [place] photorefractive keratectomy; LASIK laser assisted in situ keratomileusis; HTN hypertension; DM diabetes mellitus; COPD chronic obstructive pulmonary disease

## 2020-08-09 ENCOUNTER — Ambulatory Visit: Payer: Medicare Other

## 2020-08-09 ENCOUNTER — Telehealth: Payer: Self-pay

## 2020-08-09 DIAGNOSIS — N183 Chronic kidney disease, stage 3 unspecified: Secondary | ICD-10-CM

## 2020-08-09 DIAGNOSIS — E1122 Type 2 diabetes mellitus with diabetic chronic kidney disease: Secondary | ICD-10-CM

## 2020-08-09 NOTE — Telephone Encounter (Signed)
  Care Management   Follow Up Note   08/09/2020 Name: Mary [name] MRN: [id number] DOB: [birth year]   Referred by: Dorothyann Peng, MD Reason for referral : Chronic Care Management (Unsuccessful call)   An unsuccessful telephone outreach was attempted today. The patient was referred to the case management team for assistance with care management and care coordination. SW left a HIPAA compliant voice message requesting a return call.  Follow Up Plan: The care management team will reach out to the patient again over the next 30 days.   Bevelyn Ngo, BSW, CDP Social Worker, Certified Dementia Practitioner [facility] [phone]

## 2020-08-09 NOTE — Patient Instructions (Signed)
Social Worker Visit Information  Goals we discussed today:   Goals Addressed             This Visit's Progress    COMPLETED: Effective Long Term Care Planning       Timeframe:  Short-Term Goal Priority:  High Start Date:   7.11.22                          Expected End Date:  8.25.22                     Patient Goals/Self-Care Activities patient will:   - Contact SW as needed with future long term care planning needs         Follow Up Plan: SW will follow up with patient by phone over the next month  Bevelyn Ngo, BSW, CDP Social Worker, Certified Dementia Practitioner [facility] [phone]

## 2020-08-09 NOTE — Chronic Care Management (AMB) (Signed)
Chronic Care Management    Social Work Note  08/09/2020 Name: Mary [name] MRN: [id number] DOB: [birth year]  Mary [name] is a 75 y.o. year old female who is a primary care patient of Glendale Chard, MD. The CCM team was consulted to assist the patient with chronic disease management and/or care coordination needs related to: Level of Care Concerns.   Engaged with patient by telephone for follow up visit in response to provider referral for social work chronic care management and care coordination services.   Consent to Services:  The patient was given information about Chronic Care Management services, agreed to services, and gave verbal consent prior to initiation of services.  Please see initial visit note for detailed documentation.   Patient agreed to services and consent obtained.   Assessment: Review of patient past medical history, allergies, medications, and health status, including review of relevant consultants reports was performed today as part of a comprehensive evaluation and provision of chronic care management and care coordination services.     SDOH (Social Determinants of Health) assessments and interventions performed:    Advanced Directives Status: Not addressed in this encounter.  CCM Care Plan  No Known Allergies  Outpatient Encounter Medications as of 08/09/2020  Medication Sig Note   aspirin 81 MG chewable tablet 1 tablet    Assure Comfort Lancets 28G MISC     Blood Glucose Monitoring Suppl (ONETOUCH VERIO FLEX SYSTEM) w/Device KIT Use as directed to check blood sugars 2 times per day dx: e11.65    cephALEXin (KEFLEX) 500 MG capsule Take 500 mg by mouth.    fluticasone (FLONASE) 50 MCG/ACT nasal spray SPRAY 1 SPRAY INTO EACH NOSTRIL EVERY DAY    furosemide (LASIX) 40 MG tablet TAKE 1 TABLET BY MOUTH  DAILY    glucose blood (ONETOUCH VERIO) test strip Use as directed to check blood sugars 2 times per day dx: e11.65    HYDROcodone-acetaminophen (NORCO)  10-325 MG tablet Take 1 tablet by mouth 5 (five) times daily as needed.    KLOR-CON M20 20 MEQ tablet Take 20 mEq by mouth daily. with food 02/23/2018: Patient instructed to hold this medication due to abnormal Potassium level of 6.2 on 02/18/18, per MD, Dr. Glendale Chard. Patient was given verbal education related to complications from hypo and hyperkalemia and importance of following MD recommendations. Patient instructed to resume this medication when directed by PCP. Patient verbalizes understanding.    levothyroxine (SYNTHROID) 125 MCG tablet Take 125 mcg by mouth daily before breakfast.    magnesium oxide (MAG-OX) 400 MG tablet TAKE 1 TABLET BY MOUTH EVERY DAY IN THE EVENING    metoprolol succinate (TOPROL-XL) 50 MG 24 hr tablet TAKE 1 TABLET BY MOUTH  DAILY    omeprazole (PRILOSEC) 20 MG capsule TAKE 1 CAPSULE BY MOUTH  DAILY    OneTouch Delica Lancets 76L MISC Use as directed to check blood sugars 2 times per day dx: e11.65    pravastatin (PRAVACHOL) 40 MG tablet TAKE 1 TABLET BY MOUTH EVERY DAY    telmisartan (MICARDIS) 80 MG tablet TAKE 1 TABLET BY MOUTH  DAILY    timolol (BETIMOL) 0.25 % ophthalmic solution Place 1 drop into the right eye 2 (two) times daily.    triamcinolone (KENALOG) 0.1 % APPLY TO AFFECTED AREA TWICE A DAY    No facility-administered encounter medications on file as of 08/09/2020.    Patient Active Problem List   Diagnosis Date Noted   Change in bowel habit 01/20/2020  Chronic anemia 01/20/2020   Colon cancer screening 01/20/2020   Diabetes mellitus with stage 3 chronic kidney disease ([place]) 02/21/2018   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   Primary osteoarthritis of both knees 02/21/2018   Class 3 severe obesity due to excess calories with serious comorbidity and body mass index (BMI) of 45.0 to 49.9 in adult [facility]) 02/21/2018   Skin ulcer of right foot with fat layer exposed ([place]) 02/18/2018   Morbid (severe) obesity due to excess calories ([place]) 11/18/2017    Knee pain 07/16/2015   Lateral dislocation of right patella 05/04/2015   Rupture of right quadriceps tendon 05/24/2014   S/P TKR (total knee replacement) 05/05/2012    Conditions to be addressed/monitored: DMII and CKD Stage III ; Level of care concerns  Care Plan : Social Work Care Plan  Updates made by [name] since 08/09/2020 12:00 AM     Problem: Long-Term Care Planning Resolved 08/09/2020     Goal: Effective Long-Term Care Planning Completed 08/09/2020  Start Date: 07/24/2020  Expected End Date: 09/07/2020  Priority: High  Note:   Current Barriers:  Chronic disease management support and education needs related to DM and CKD Stage III   Level of care concerns  Social Worker Clinical Goal(s):  patient will work with SW to identify and address any acute and/or chronic care coordination needs related to the self health management of DM and CKD Stage III   Patient and her daughter will follow up with primary care provider regarding placement needs as directed by SW  SW Interventions:  Inter-disciplinary care team collaboration (see longitudinal plan of care) Collaboration with Glendale Chard, MD regarding development and update of comprehensive plan of care as evidenced by provider attestation and co-signature Spoke with patient who indicates she has changed her mind and no longer wants to enroll in [facility] Discussed the patient would like to continue receiving care by Dr. Baird [facility] and also does not feel she needs help except with housekeeping Assessed for patient interest in placement based on the patients daughter reporting concern over patient ability to remain in the home Determined the patient feels she can remain in her home safely as she only needs assistance with housekeeping and laundry SW to close goal for long term care planning with option to re-open as needed  Patient Goals/Self-Care Activities patient will:   - Contact SW as needed with future long  term care planning needs        Follow Up Plan: SW will follow up with patient by phone over the next month      [name], BSW, CDP Social Worker, Certified Dementia Practitioner [place] [facility] [place] [facility] [phone]

## 2020-08-10 DIAGNOSIS — N183 Chronic kidney disease, stage 3 unspecified: Secondary | ICD-10-CM | POA: Diagnosis not present

## 2020-08-10 DIAGNOSIS — I129 Hypertensive chronic kidney disease with stage 1 through stage 4 chronic kidney disease, or unspecified chronic kidney disease: Secondary | ICD-10-CM | POA: Diagnosis not present

## 2020-08-10 DIAGNOSIS — I890 Lymphedema, not elsewhere classified: Secondary | ICD-10-CM | POA: Diagnosis not present

## 2020-08-10 DIAGNOSIS — R262 Difficulty in walking, not elsewhere classified: Secondary | ICD-10-CM | POA: Diagnosis not present

## 2020-08-10 DIAGNOSIS — M170 Bilateral primary osteoarthritis of knee: Secondary | ICD-10-CM | POA: Diagnosis not present

## 2020-08-10 DIAGNOSIS — L89892 Pressure ulcer of other site, stage 2: Secondary | ICD-10-CM | POA: Diagnosis not present

## 2020-08-10 DIAGNOSIS — E1122 Type 2 diabetes mellitus with diabetic chronic kidney disease: Secondary | ICD-10-CM | POA: Diagnosis not present

## 2020-08-11 ENCOUNTER — Ambulatory Visit (INDEPENDENT_AMBULATORY_CARE_PROVIDER_SITE_OTHER): Payer: Medicare Other | Admitting: Ophthalmology

## 2020-08-11 ENCOUNTER — Encounter (INDEPENDENT_AMBULATORY_CARE_PROVIDER_SITE_OTHER): Payer: Self-pay | Admitting: Ophthalmology

## 2020-08-11 ENCOUNTER — Other Ambulatory Visit: Payer: Self-pay

## 2020-08-11 DIAGNOSIS — E113313 Type 2 diabetes mellitus with moderate nonproliferative diabetic retinopathy with macular edema, bilateral: Secondary | ICD-10-CM

## 2020-08-11 DIAGNOSIS — H3581 Retinal edema: Secondary | ICD-10-CM | POA: Diagnosis not present

## 2020-08-11 DIAGNOSIS — H34813 Central retinal vein occlusion, bilateral, with macular edema: Secondary | ICD-10-CM

## 2020-08-11 DIAGNOSIS — H25812 Combined forms of age-related cataract, left eye: Secondary | ICD-10-CM

## 2020-08-11 DIAGNOSIS — Z961 Presence of intraocular lens: Secondary | ICD-10-CM

## 2020-08-11 DIAGNOSIS — L89892 Pressure ulcer of other site, stage 2: Secondary | ICD-10-CM | POA: Diagnosis not present

## 2020-08-11 DIAGNOSIS — I10 Essential (primary) hypertension: Secondary | ICD-10-CM

## 2020-08-11 DIAGNOSIS — H401111 Primary open-angle glaucoma, right eye, mild stage: Secondary | ICD-10-CM

## 2020-08-11 DIAGNOSIS — I890 Lymphedema, not elsewhere classified: Secondary | ICD-10-CM | POA: Diagnosis not present

## 2020-08-11 DIAGNOSIS — H35033 Hypertensive retinopathy, bilateral: Secondary | ICD-10-CM | POA: Diagnosis not present

## 2020-08-11 MED ORDER — TRIAMCINOLONE ACETONIDE 40 MG/ML IJ SUSP FOR KALEIDOSCOPE
4.0000 mg | INTRAMUSCULAR | Status: AC | PRN
  Administered 2020-08-11: 4 mg via INTRAVITREAL

## 2020-08-11 NOTE — Patient Instructions (Signed)
Goals Addressed      Chronic Kidney disease - maintain adequate kidney function   On track    Timeframe:  Long-Range Goal Priority:  Medium Start Date: 05/03/20                            Expected End Date: 05/03/21    Next Follow Up date: 12/14/20      Self Care Activities:  Continue to adhere to MD recommendations for CKD  Continue to keep all scheduled follow up appointments Take medications as directed  Let your healthcare team know if you are unable to take your medications Call your pharmacy for refills at least 7 days prior to running out of medication Increase your water intake unless otherwise directed Patient Goals: - to maintain adequate kidney function                    Fall prevention   On track    Timeframe:  Long-Range Goal Priority:  High Start Date: 03/29/20                            Expected End Date: 03/29/21  Next Follow Up date: 12/14/20  Patient Goals/Self Care Activities:  Call PCP for questions or concerns Continue to take all medications exactly as prescribed  Keep all scheduled follow up appointments  Let the embedded BSW know about any transportation issues that may arise Call Pharmacy for refills 7 days before running out of medication  Follow fall prevention tips and use DME at all times as discussed  Continue to follow HEP regimen                          Skin Integrity maintained or improved   On track    Timeframe:  Long-Range Goal Priority:  High Start Date: 03/29/20                            Expected End Date: 03/29/21  Next Follow Up date: 12/14/20  Patient Goals/Self Care Activities:  Call PCP for questions or concerns Continue to take all medications exactly as prescribed  Keep all scheduled follow up appointments  Complete daily skin inspections and report early sign of skin breakdown promptly  Keep skin clean, dry and intact; Follow skin breakdown prevention techniques as discussed  Follow recommendations for wound care to left heel

## 2020-08-11 NOTE — Chronic Care Management (AMB) (Signed)
Chronic Care Management   CCM RN Visit Note  08/02/2020 Name: Mary [name] MRN: [id number] DOB: [birth year]  Subjective: Mary [name] is a 75 y.o. year old female who is a primary care patient of Glendale Chard, MD. The care management team was consulted for assistance with disease management and care coordination needs.    Engaged with patient by telephone for follow up visit in response to provider referral for case management and/or care coordination services.   Consent to Services:  The patient was given information about Chronic Care Management services, agreed to services, and gave verbal consent prior to initiation of services.  Please see initial visit note for detailed documentation.   Patient agreed to services and verbal consent obtained.   Assessment: Review of patient past medical history, allergies, medications, health status, including review of consultants reports, laboratory and other test data, was performed as part of comprehensive evaluation and provision of chronic care management services.   SDOH (Social Determinants of Health) assessments and interventions performed:    CCM Care Plan  No Known Allergies  Outpatient Encounter Medications as of 08/02/2020  Medication Sig Note   aspirin 81 MG chewable tablet 1 tablet    Assure Comfort Lancets 28G MISC     Blood Glucose Monitoring Suppl (ONETOUCH VERIO FLEX SYSTEM) w/Device KIT Use as directed to check blood sugars 2 times per day dx: e11.65    cephALEXin (KEFLEX) 500 MG capsule Take 500 mg by mouth.    fluticasone (FLONASE) 50 MCG/ACT nasal spray SPRAY 1 SPRAY INTO EACH NOSTRIL EVERY DAY    furosemide (LASIX) 40 MG tablet TAKE 1 TABLET BY MOUTH  DAILY    glucose blood (ONETOUCH VERIO) test strip Use as directed to check blood sugars 2 times per day dx: e11.65    HYDROcodone-acetaminophen (NORCO) 10-325 MG tablet Take 1 tablet by mouth 5 (five) times daily as needed.    KLOR-CON M20 20 MEQ tablet Take 20 mEq  by mouth daily. with food 02/23/2018: Patient instructed to hold this medication due to abnormal Potassium level of 6.2 on 02/18/18, per MD, Dr. Glendale Chard. Patient was given verbal education related to complications from hypo and hyperkalemia and importance of following MD recommendations. Patient instructed to resume this medication when directed by PCP. Patient verbalizes understanding.    levothyroxine (SYNTHROID) 125 MCG tablet Take 125 mcg by mouth daily before breakfast.    magnesium oxide (MAG-OX) 400 MG tablet TAKE 1 TABLET BY MOUTH EVERY DAY IN THE EVENING    metoprolol succinate (TOPROL-XL) 50 MG 24 hr tablet TAKE 1 TABLET BY MOUTH  DAILY    omeprazole (PRILOSEC) 20 MG capsule TAKE 1 CAPSULE BY MOUTH  DAILY    OneTouch Delica Lancets 38V MISC Use as directed to check blood sugars 2 times per day dx: e11.65    pravastatin (PRAVACHOL) 40 MG tablet TAKE 1 TABLET BY MOUTH EVERY DAY    telmisartan (MICARDIS) 80 MG tablet TAKE 1 TABLET BY MOUTH  DAILY    timolol (BETIMOL) 0.25 % ophthalmic solution Place 1 drop into the right eye 2 (two) times daily.    triamcinolone (KENALOG) 0.1 % APPLY TO AFFECTED AREA TWICE A DAY    No facility-administered encounter medications on file as of 08/02/2020.    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   Primary osteoarthritis of both knees 02/21/2018   Class 3 severe obesity due to excess calories with serious comorbidity and body mass index (BMI) of 45.0 to 49.9 in adult [facility]) 02/21/2018   Skin ulcer of right foot with fat layer exposed ([place]) 02/18/2018   Morbid (severe) obesity due to excess calories ([place]) 11/18/2017   Knee pain 07/16/2015   Lateral dislocation of right patella 05/04/2015   Rupture of right quadriceps tendon  05/24/2014   S/P TKR (total knee replacement) 05/05/2012    Conditions to be addressed/monitored: Diabetes Mellitus with stage 3 Chronic Kidney disease, Parenchymal renal hypertension, Class 3 severe obesity, Edema of left lower extremity, Primary Hypothyroidism    Care Plan : Impaired Physical Mobility  Updates made by Lynne Logan, RN since 08/02/2020 12:00 AM     Problem: Impaired Physical Mobility   Priority: High     Long-Range Goal: Impaired Physical Mobility falls prevented or minimized   Start Date: 03/29/2020  Expected End Date: 03/29/2021  Recent Progress: On track  Priority: High  Note:   Current Barriers:  Ineffective Self Health Maintenance Lacks social connections Unable to perform ADLs independently Unable to perform IADLs independently Clinical Goal(s):  Collaboration with Glendale Chard, MD regarding development and update of comprehensive plan of care as evidenced by provider attestation and co-signature Inter-disciplinary care team collaboration (see longitudinal plan of care) patient will work with care management team to address care coordination and chronic disease management needs related to Disease Management Educational Needs Care Coordination Medication Management and Education Psychosocial Support   Interventions:  08/02/20 completed successful outbound call with patient  Evaluation of current treatment plan related to  Diabetes Mellitus with stage 3 Chronic Kidney disease, Parenchymal renal hypertension, Class 3 severe obesity, Edema of left lower extremity, Primary Hypothyroidism , self-management and patient's adherence to plan as established by provider. Collaboration with Glendale Chard, MD regarding development and update of comprehensive plan of care as evidenced by provider attestation       and co-signature Inter-disciplinary care team collaboration (see longitudinal plan of care) Determined patient feels her transfers have improved, although  she is still very limited with her mobility  Re-educated on fall prevention tips and discussed any home safety concerns Educated on skin prevention and encouraged patient to do daily skin inspections reporting any signs of impaired skin integrity promptly  Instructed patient to use her DME at all times to help prevent falls and preserve injury Discussed plans with patient for ongoing care management follow up and provided patient with direct contact information for care management team Self Care Activities:  Call PCP for questions or concerns Continue to take all medications exactly as prescribed  Keep all scheduled follow up appointments  Let the embedded BSW know about any transportation issues that may arise Call Pharmacy for refills 7 days before running out of medication  Follow fall prevention tips and use DME at all times as discussed  Continue to follow HEP as directed Patient Goals: - falls prevented or minimized    Follow Up Plan: Telephone follow up appointment with care management team member scheduled for: 12/14/20    Care Plan : Impaired Skin Intregrity  Updates made by Lynne Logan, RN since 08/02/2020 12:00 AM     Problem: Impaired Skin Integrity   Priority: High     Long-Range Goal: Impaired Skin Integrity   Start Date: 03/29/2020  Expected End Date: 03/29/2021  Recent  Progress: On track  Priority: High  Note:   Current Barriers:  Ineffective Self Health Maintenance Lacks social connections Unable to perform ADLs independently Unable to perform IADLs independently Clinical Goal(s):  Collaboration with Glendale Chard, MD regarding development and update of comprehensive plan of care as evidenced by provider attestation and co-signature Inter-disciplinary care team collaboration (see longitudinal plan of care) patient will work with care management team to address care coordination and chronic disease management needs related to Disease Management Educational  Needs Care Coordination Medication Management and Education Psychosocial Support   Interventions:  08/02/20 completed successful outbound call with patient  Evaluation of current treatment plan related to Diabetes Mellitus with stage 3 Chronic Kidney disease, Parenchymal renal hypertension, Class 3 severe obesity, Edema of left lower extremity, Primary Hypothyroidism, self-management and patient's adherence to plan as established by provider. Collaboration with Glendale Chard, MD regarding development and update of comprehensive plan of care as evidenced by provider attestation       and co-signature Inter-disciplinary care team collaboration (see longitudinal plan of care) Determined patient is receiving [place] services from [facility] for in home PT and wound care to the wound on her left heel Determined patient states her wound is healing by evidence the wound is getting smaller  Educated patient on importance of adhering to lymphedema leg pumps, to use as directed Educated on skin care and skin breakdown prevention; importance of keeping lower extremities elevated while resting  Discussed plans with patient for ongoing care management follow up and provided patient with direct contact information for care management team Self Care Activities:  Call PCP for questions or concerns Continue to take all medications exactly as prescribed  Keep all scheduled follow up appointments  Complete daily skin inspections and report early sign of skin breakdown promptly  Keep skin clean, dry and intact; Follow skin breakdown prevention techniques as discussed  Follow recommendations for wound care to left heel Patient Goals: - for wound to heal  Follow Up Plan: Telephone follow up appointment with care management team member scheduled for: 12/14/20    Care Plan : Chronic Kidney disease  Updates made by Lynne Logan, RN since 08/02/2020 12:00 AM     Problem: Chronic Kidney disease    Priority: High     Long-Range Goal: Chronic Kidney disease - maintain adequate kidney function   Start Date: 05/03/2020  Expected End Date: 05/03/2021  Recent Progress: On track  Priority: High  Note:   Current Barriers:  Ineffective Self Health Maintenance  Lacks social connections Self Care deficit Clinical Goal(s):  Collaboration with Glendale Chard, MD regarding development and update of comprehensive plan of care as evidenced by provider attestation and co-signature Inter-disciplinary care team collaboration (see longitudinal plan of care) patient will work with care management team to address care coordination and chronic disease management needs related to Disease Management Educational Needs Care Coordination Medication Management and Education Psychosocial Support   Interventions:  08/02/20 completed successful outbound call with patient  Evaluation of current treatment plan related to CKD Stage III , self-management and patient's adherence to plan as established by provider. Collaboration with Glendale Chard, MD regarding development and update of comprehensive plan of care as evidenced by provider attestation       and co-signature Inter-disciplinary care team collaboration (see longitudinal plan of care) Provided education to patient about basic disease process related to CKD Review of patient status, including review of consultants reports, relevant laboratory and other test results, and medications  completed. Reviewed medications with patient and discussed importance of medication adherence Educated on ways to improve kidney function, increase water to 64 oz per day, keep A1c and BP under optimal control, eat a well balanced low Sodium diet Mailed printed educational materials related to Eating Right with Chronic Kidney disease; Why I Should Lower Sodium  Discussed plans with patient for ongoing care management follow up and provided patient with direct contact  information for care management team Self Care Activities:  Continue to adhere to MD recommendations for CKD  Continue to keep all scheduled follow up appointments Take medications as directed  Let your healthcare team know if you are unable to take your medications Call your pharmacy for refills at least 7 days prior to running out of medication Increase your water intake unless otherwise directed Patient Goals: - to maintain adequate kidney function   Follow Up Plan: Telephone follow up appointment with care management team member scheduled for: 12/14/20     Plan:Telephone follow up appointment with care management team member scheduled for:  12/14/20  Barb Merino, RN, BSN, CCM Care Management Coordinator [place] [facility]  Direct Phone: [phone]

## 2020-08-17 DIAGNOSIS — M25512 Pain in left shoulder: Secondary | ICD-10-CM | POA: Diagnosis not present

## 2020-08-17 DIAGNOSIS — M25561 Pain in right knee: Secondary | ICD-10-CM | POA: Diagnosis not present

## 2020-08-17 DIAGNOSIS — G894 Chronic pain syndrome: Secondary | ICD-10-CM | POA: Diagnosis not present

## 2020-08-17 DIAGNOSIS — Z79891 Long term (current) use of opiate analgesic: Secondary | ICD-10-CM | POA: Diagnosis not present

## 2020-08-17 DIAGNOSIS — M150 Primary generalized (osteo)arthritis: Secondary | ICD-10-CM | POA: Diagnosis not present

## 2020-09-04 DIAGNOSIS — Z6841 Body Mass Index (BMI) 40.0 and over, adult: Secondary | ICD-10-CM

## 2020-09-04 DIAGNOSIS — L89892 Pressure ulcer of other site, stage 2: Secondary | ICD-10-CM

## 2020-09-04 DIAGNOSIS — I890 Lymphedema, not elsewhere classified: Secondary | ICD-10-CM

## 2020-09-04 DIAGNOSIS — E1122 Type 2 diabetes mellitus with diabetic chronic kidney disease: Secondary | ICD-10-CM

## 2020-09-04 DIAGNOSIS — I129 Hypertensive chronic kidney disease with stage 1 through stage 4 chronic kidney disease, or unspecified chronic kidney disease: Secondary | ICD-10-CM | POA: Diagnosis not present

## 2020-09-04 DIAGNOSIS — M170 Bilateral primary osteoarthritis of knee: Secondary | ICD-10-CM

## 2020-09-04 DIAGNOSIS — N183 Chronic kidney disease, stage 3 unspecified: Secondary | ICD-10-CM

## 2020-09-04 DIAGNOSIS — R262 Difficulty in walking, not elsewhere classified: Secondary | ICD-10-CM

## 2020-09-06 ENCOUNTER — Ambulatory Visit (INDEPENDENT_AMBULATORY_CARE_PROVIDER_SITE_OTHER): Payer: Medicare Other

## 2020-09-06 DIAGNOSIS — E1122 Type 2 diabetes mellitus with diabetic chronic kidney disease: Secondary | ICD-10-CM | POA: Diagnosis not present

## 2020-09-06 DIAGNOSIS — N183 Chronic kidney disease, stage 3 unspecified: Secondary | ICD-10-CM

## 2020-09-06 NOTE — Chronic Care Management (AMB) (Signed)
Chronic Care Management    Social Work Note  09/06/2020 Name: Mary [name] MRN: [id number] DOB: May 22, 1945  Mary [name] is a 75 y.o. year old female who is a primary care patient of Glendale Chard, MD. The CCM team was consulted to assist the patient with chronic disease management and/or care coordination needs related to:  DM and CKD III .   Engaged with patient by telephone for follow up visit in response to provider referral for social work chronic care management and care coordination services.   Consent to Services:  The patient was given information about Chronic Care Management services, agreed to services, and gave verbal consent prior to initiation of services.  Please see initial visit note for detailed documentation.   Patient agreed to services and consent obtained.   Assessment: Review of patient past medical history, allergies, medications, and health status, including review of relevant consultants reports was performed today as part of a comprehensive evaluation and provision of chronic care management and care coordination services.     SDOH (Social Determinants of Health) assessments and interventions performed:    Advanced Directives Status: Not addressed in this encounter.  CCM Care Plan  No Known Allergies  Outpatient Encounter Medications as of 09/06/2020  Medication Sig Note   aspirin 81 MG chewable tablet 1 tablet    Assure Comfort Lancets 28G MISC     Blood Glucose Monitoring Suppl (ONETOUCH VERIO FLEX SYSTEM) w/Device KIT Use as directed to check blood sugars 2 times per day dx: e11.65    cephALEXin (KEFLEX) 500 MG capsule Take 500 mg by mouth.    fluticasone (FLONASE) 50 MCG/ACT nasal spray SPRAY 1 SPRAY INTO EACH NOSTRIL EVERY DAY    furosemide (LASIX) 40 MG tablet TAKE 1 TABLET BY MOUTH  DAILY    glucose blood (ONETOUCH VERIO) test strip Use as directed to check blood sugars 2 times per day dx: e11.65    HYDROcodone-acetaminophen (NORCO) 10-325  MG tablet Take 1 tablet by mouth 5 (five) times daily as needed.    KLOR-CON M20 20 MEQ tablet Take 20 mEq by mouth daily. with food 02/23/2018: Patient instructed to hold this medication due to abnormal Potassium level of 6.2 on 02/18/18, per MD, Dr. Glendale Chard. Patient was given verbal education related to complications from hypo and hyperkalemia and importance of following MD recommendations. Patient instructed to resume this medication when directed by PCP. Patient verbalizes understanding.    levothyroxine (SYNTHROID) 125 MCG tablet Take 125 mcg by mouth daily before breakfast.    magnesium oxide (MAG-OX) 400 MG tablet TAKE 1 TABLET BY MOUTH EVERY DAY IN THE EVENING    metoprolol succinate (TOPROL-XL) 50 MG 24 hr tablet TAKE 1 TABLET BY MOUTH  DAILY    omeprazole (PRILOSEC) 20 MG capsule TAKE 1 CAPSULE BY MOUTH  DAILY    OneTouch Delica Lancets 24M MISC Use as directed to check blood sugars 2 times per day dx: e11.65    pravastatin (PRAVACHOL) 40 MG tablet TAKE 1 TABLET BY MOUTH EVERY DAY    telmisartan (MICARDIS) 80 MG tablet TAKE 1 TABLET BY MOUTH  DAILY    timolol (BETIMOL) 0.25 % ophthalmic solution Place 1 drop into the right eye 2 (two) times daily.    triamcinolone (KENALOG) 0.1 % APPLY TO AFFECTED AREA TWICE A DAY    No facility-administered encounter medications on file as of 09/06/2020.    Patient Active Problem List   Diagnosis Date Noted   Change in bowel  habit 01/20/2020   Chronic anemia 01/20/2020   Colon cancer screening 01/20/2020   Diabetes mellitus with stage 3 chronic kidney disease ([place]) 02/21/2018   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   Primary osteoarthritis of both knees 02/21/2018   Class 3 severe obesity due to excess calories with serious comorbidity and body mass index (BMI) of 45.0 to 49.9 in adult [facility]) 02/21/2018   Skin ulcer of right foot with fat layer exposed ([place]) 02/18/2018   Morbid (severe) obesity due to excess calories ([place]) 11/18/2017   Knee  pain 07/16/2015   Lateral dislocation of right patella 05/04/2015   Rupture of right quadriceps tendon 05/24/2014   S/P TKR (total knee replacement) 05/05/2012    Conditions to be addressed/monitored: DMII and CKD Stage III  Care Plan : Social Work Care Plan  Updates made by [name] since 09/06/2020 12:00 AM     Problem: Care Coordination      Long-Range Goal: Identify resources to assist with care coordination needs   Start Date: 02/15/2020  This Visit's Progress: On track  Recent Progress: On track  Priority: Low  Note:   Current Barriers:  Limited social support Transportation ADL IADL limitations Chronic conditions including DM II, CKD III, and obesity which put patient at increased risk for hospitalization   Social Work Clinical Goal(s):  patient will work with SW to address concerns related to care coordination  Interventions: 1:1 collaboration with Glendale Chard, MD regarding development and update of comprehensive plan of care as evidenced by provider attestation and co-signature Inter-disciplinary care team collaboration (see longitudinal plan of care) Successful outbound call placed to the patient to assess for care coordination needs Discussed the patient is doing well at this time Patient continues to use [facility] for provider appointments which has been successful Discussed the patient continues to be on the CAP wait list and recently received information in the mail that she has moved to spot 30 on the wait list Encouraged the patient to contact SW as needed Scheduled follow up call over the next 90 days  Patient Goals/Self-Care Activities  patient will:   - [employer] as needed -Contact SW as needed prior to next scheduled call  Follow up Plan: SW will follow up with patient by phone over the next 90 days       Follow Up Plan: SW will follow up with patient by phone over the next 90 days      [name], BSW,  CDP Social Worker, Certified Dementia Practitioner [place] [facility] [place] [facility] [phone]

## 2020-09-06 NOTE — Patient Instructions (Signed)
Social Worker Visit Information  Goals we discussed today:   Goals Addressed             This Visit's Progress    work with SW to manage care coordination needs   On track    Timeframe:  Long-Range Goal Priority:  Low Start Date:  2.1.22                                               Next planned outreach: 11.14.22  Patient Goals/Self-Care Activities  patient will:   - [employer] as needed -Contact SW as needed prior to next scheduled call         Follow Up Plan: SW will follow up with patient by phone over the next 90 days   Bevelyn Ngo, BSW, CDP Social Worker, Certified Dementia Practitioner [facility] Management [phone]

## 2020-09-10 ENCOUNTER — Other Ambulatory Visit: Payer: Self-pay | Admitting: Internal Medicine

## 2020-09-26 NOTE — Progress Notes (Signed)
[facility] Note  09/29/2020     CHIEF COMPLAINT Patient presents for Retina Follow Up   HISTORY OF PRESENT ILLNESS: Mary [name] is a 75 y.o. female who presents to the clinic today for:   HPI     Retina Follow Up   Patient presents with  CRVO/BRVO.  In both eyes.  Duration of 7 weeks.  Since onset it is gradually worsening.  I, the attending physician,  performed the HPI with the patient and updated documentation appropriately.        Comments   7 week follow up CRVO OU- Everything looks dark out of OD.  This is a new complaint.  She made an appt to see Dr. Kathlen Mody on Monday to see if he can help with her vision.  Using Timolol BID OD. BS 79 yesterday morning A1C 5.2      Last edited by Bernarda Caffey, MD on 10/01/2020 10:01 PM.    Pt states about 2 weeks after her last injection, she felt like her vision become dark and it still looks that way, she states she doesn't see floaters   Referring physician: Hortencia Pilar, MD [place],  [place] [address]  HISTORICAL INFORMATION:   Selected notes from the [place] Transferred care from Dr. Zigmund Daniel   CURRENT MEDICATIONS: Current Outpatient Medications (Ophthalmic Drugs)  Medication Sig   timolol (BETIMOL) 0.25 % ophthalmic solution Place 1 drop into the right eye 2 (two) times daily.   No current facility-administered medications for this visit. (Ophthalmic Drugs)   Current Outpatient Medications (Other)  Medication Sig   aspirin 81 MG chewable tablet 1 tablet   fluticasone (FLONASE) 50 MCG/ACT nasal spray SPRAY 1 SPRAY INTO EACH NOSTRIL EVERY DAY   furosemide (LASIX) 40 MG tablet TAKE 1 TABLET BY MOUTH  DAILY   HYDROcodone-acetaminophen (NORCO) 10-325 MG tablet Take 1 tablet by mouth 5 (five) times daily as needed.   KLOR-CON M20 20 MEQ tablet Take 20 mEq by mouth daily. with food   levothyroxine (SYNTHROID) 125 MCG tablet Take 125 mcg by mouth  daily before breakfast.   magnesium oxide (MAG-OX) 400 MG tablet TAKE 1 TABLET BY MOUTH EVERY DAY IN THE EVENING   metoprolol succinate (TOPROL-XL) 50 MG 24 hr tablet TAKE 1 TABLET BY MOUTH  DAILY   omeprazole (PRILOSEC) 20 MG capsule TAKE 1 CAPSULE BY MOUTH  DAILY   pravastatin (PRAVACHOL) 40 MG tablet TAKE 1 TABLET BY MOUTH EVERY DAY   telmisartan (MICARDIS) 80 MG tablet TAKE 1 TABLET BY MOUTH  DAILY   triamcinolone (KENALOG) 0.1 % APPLY TO AFFECTED AREA TWICE A DAY   Assure Comfort Lancets 28G MISC    Blood Glucose Monitoring Suppl (ONETOUCH VERIO FLEX SYSTEM) w/Device KIT Use as directed to check blood sugars 2 times per day dx: e11.65   cephALEXin (KEFLEX) 500 MG capsule Take 500 mg by mouth. (Patient not taking: Reported on 09/29/2020)   glucose blood (ONETOUCH VERIO) test strip Use as directed to check blood sugars 2 times per day dx: H29.92   OneTouch Delica Lancets 42A MISC Use as directed to check blood sugars 2 times per day dx: e11.65   No current facility-administered medications for this visit. (Other)   REVIEW OF SYSTEMS: ROS   Positive for: Genitourinary, Musculoskeletal, Endocrine, Eyes Negative for: Constitutional, Gastrointestinal, Neurological, Skin, HENT, Cardiovascular, Respiratory, Psychiatric, Allergic/Imm, Heme/Lymph Last edited by Leonie Douglas, COA on 09/29/2020  9:06 AM.  ALLERGIES No Known Allergies  PAST MEDICAL HISTORY Past Medical History:  Diagnosis Date   Arthritis    osteoarthritis   Cataract    OS   Diabetes mellitus    Insulin dependent   Diabetic retinopathy ([place])    NPDR OU   Glaucoma    POAG OD   H/O cardiovascular stress test    pt. reports 10/13- was normal per pt.    Hyperlipemia    Hypertension    Hypertensive retinopathy    OU   Past Surgical History:  Procedure Laterality Date   CARPAL TUNNEL RELEASE Bilateral    CATARACT EXTRACTION Right 10/05/2018   Dr. Kathlen Mody   EYE SURGERY Right    Cat Sx   KNEE ARTHROSCOPY      right   QUADRICEPS TENDON REPAIR  02/17/2012   Procedure: REPAIR QUADRICEP TENDON;  Surgeon: Vickey Huger, MD;  Location: [place];  Service: Orthopedics;  Laterality: Right;  right quadriceps repair with latera release   QUADRICEPS TENDON REPAIR Right 05/15/2012   Procedure: REPAIR QUADRICEP TENDON;  Surgeon: Vickey Huger, MD;  Location: [place];  Service: Orthopedics;  Laterality: Right;   TOTAL KNEE ARTHROPLASTY  10/21/2011   Procedure: TOTAL KNEE ARTHROPLASTY;  Surgeon: Rudean Haskell, MD;  Location: [place];  Service: Orthopedics;  Laterality: Right;   TUBAL LIGATION      FAMILY HISTORY Family History  Problem Relation Age of Onset   Hypertension Mother    Heart Problems Father    Gout Father    Arthritis Father     SOCIAL HISTORY Social History   Tobacco Use   Smoking status: Former    Packs/day: 1.00    Years: 30.00    Pack years: 30.00    Types: Cigarettes    Start date: 01/15/1967    Quit date: 06/28/2014    Years since quitting: 6.2   Smokeless tobacco: Never  Vaping Use   Vaping Use: Never used  Substance Use Topics   Alcohol use: No   Drug use: Yes    Types: Hydrocodone         OPHTHALMIC EXAM:  Base Eye Exam     Visual Acuity (Snellen - Linear)       Right Left   Dist [place] 20/70 +1 HM   Dist ph [place] NI   Due to chair being so big, she is further up that the 20' area.        Tonometry (Tonopen, 9:17 AM)       Right Left   Pressure 15 15         Pupils       Dark Light Shape React APD   Right 3 2 Round Minimal None   Left 3 2 Round Minimal None         Visual Fields (Counting fingers)       Left Right     Full   Restrictions Total superior temporal, inferior temporal, superior nasal, inferior nasal deficiencies          Extraocular Movement       Right Left    Full LXT         Neuro/Psych     Oriented x3: Yes   Mood/Affect: Normal         Dilation     Both eyes: 1.0% Mydriacyl, 2.5% Phenylephrine @ 9:18 AM            Slit Lamp and Fundus Exam     Slit  Lamp Exam       Right Left   Lids/Lashes Dermatochalasis - upper lid, mild Meibomian gland dysfunction Dermatochalasis - upper lid, mild Meibomian gland dysfunction   Conjunctiva/Sclera Melanosis Melanosis   Cornea 2-3+ Punctate epithelial erosions, well healed temporal cataract wounds 3+ Punctate epithelial erosions, well healed temporal cataract wounds   Anterior Chamber Deep and quiet Deep and quiet   Iris Round and moderately dilated to 5.69m Round and moderately dilated to 640m  Lens PC IOL in good position, trace, cortical remnant temporally, PC folds 2-3+ Nuclear sclerosis, 2-3+ Cortical cataract   Vitreous Vitreous syneresis, vitreous condensations, Ozurdex pellet remnants inferiorly, no residual IVT Vitreous syneresis         Fundus Exam       Right Left   Disc  2-3+Pallor, Sharp rim, temporal PPP/PPA, thin superior rim Mild Pallor, Sharp rim, PPP, severely attenuated vessels   C/D Ratio 0.7 0.3   Macula Blunted foveal reflex, Drusen, Retinal pigment epithelial mottling, temporal edema with exudates -- improved Flat, Blunted foveal reflex, RPE mottling and clumping, scarring, atrophy, no heme   Vessels attenuated, Tortuous Severe Vascular attenuation, +fibrosis   Periphery Attached, focal IRH temporal periphery  Attached, pavingstone and reticular degeneration inferiorly, scattered RPE atrophy, greatest peripapillary             Refraction     Wearing Rx       Sphere Cylinder Axis Add   Right -1.00 +0.75 180 +2.25   Left -0.50 +0.75 155 +2.25  Didn't bring glasses        Manifest Refraction       Sphere Cylinder Axis Dist VA   Right -0.50 +1.00 180 NI   Left                IMAGING AND PROCEDURES  Imaging and Procedures for @TODAY @  OCT, Retina - OU - Both Eyes       Right Eye Quality was good. Central Foveal Thickness: 190. Progression has improved. Findings include abnormal foveal contour, intraretinal  fluid, intraretinal hyper-reflective material, no SRF, vitreomacular adhesion (Mild Interval improvement in temporal IRF, +vitreous opacities).   Left Eye Quality was good. Central Foveal Thickness: 226. Progression has been stable. Findings include abnormal foveal contour, no IRF, no SRF, outer retinal atrophy, epiretinal membrane, preretinal fibrosis, subretinal hyper-reflective material, pigment epithelial detachment (Stable from prior).   Notes *Images captured and stored on drive  Diagnosis / Impression:  OD: Mild Interval improvement in temporal IRF, +vitreous opacities, interval release of VMA to partial PVD OS: diffuse ORA - Stable from prior  Clinical management:  See below  Abbreviations: NFP - Normal foveal profile. CME - cystoid macular edema. PED - pigment epithelial detachment. IRF - intraretinal fluid. SRF - subretinal fluid. EZ - ellipsoid zone. ERM - epiretinal membrane. ORA - outer retinal atrophy. ORT - outer retinal tubulation. SRHM - subretinal hyper-reflective material      Intravitreal Injection, Pharmacologic Agent - OD - Right Eye       Time Out 09/29/2020. 10:50 AM. Confirmed correct patient, procedure, site, and patient consented.   Anesthesia Topical anesthesia was used. Anesthetic medications included Lidocaine 2%, Proparacaine 0.5%.   Procedure Preparation included 5% betadine to ocular surface, eyelid speculum. A 27 gauge needle was used.   Injection: 4 mg triamcinolone acetonide 40 MG/ML   Route: Intravitreal, Site: Right Eye   NDC: [id number]: : [id number] date: 06/14/2022   Post-op Post injection exam found visual acuity of at  least counting fingers. The patient tolerated the procedure well. There were no complications. The patient received written and verbal post procedure care education. Post injection medications were not given.   Notes After successful induction of topical anesthesia with proparacaine drops, lidocaine gel, and  4% lidocaine solution over the inferotemporal quadrant on q-tips, the eye was prepped with Betadine.  Subsequently, 0.1 cc (4 mg) of Kenalog was injected into the vitreous cavity utilizing a 30-gauge needle.  There were no complications of the injection.   An AC tap was performed following injection due to elevated IOP using a 30 gauge needle on a syringe with the plunger removed. The needle was placed at the limbus at 7 oclock and approximately 0.1 cc of aqueous was removed from the anterior chamber. Betadine was applied to the tap area before and after the paracentesis was performed. There were no complications. The patient tolerated the procedure well. The IOP was rechecked and was found to be ~9 mmHg by palpation.  The central retinal artery was checked and found to be patent.  The kenalog was identified in the vitreous cavity.  The patient had no complications and the discharge instructions were reviewed.             ASSESSMENT/PLAN:    ICD-10-CM   1. Central retinal vein occlusion with macular edema of both eyes  H34.8130 Intravitreal Injection, Pharmacologic Agent - OD - Right Eye    triamcinolone acetonide (KENALOG-40) injection 4 mg    2. Severe nonproliferative diabetic retinopathy of both eyes with macular edema associated with type 2 diabetes mellitus ([place])  M62.9476     3. Retinal edema  H35.81 OCT, Retina - OU - Both Eyes    4. Essential hypertension  I10     5. Hypertensive retinopathy of both eyes  H35.033     6. Combined forms of age-related cataract of left eye  H25.812     7. Pseudophakia  Z96.1       1. CRVO w/ CME OU  - pt of Dr. Zigmund Daniel who wished to transfer care  - OS long-standing low vision (CF) with subfoveal scar and central retinal atrophy  - OD with CME actively being managed with Ozurdex OD ~q5 wks by Dr. Zigmund Daniel (last on 8.20.20) s/p multiple IVTA and Ozurdex OD  - was due for Ozurdex OD w/ Zigmund Daniel on 9.25.20, but underwent cataract surgery OD w/  Kathlen Mody on 9.21.20  - s/p IVTA OD #4 (10.26.20), #5 (12.10.20), #6 (01.21.21), #7 (03.05.21), #8 (04.16.21), #9 (05.28.21), #10 (07.09.21), #11 (09.14.21), #12 (10.29.21), #13 (12.17.21), #14 (01.27.22), #15 (03.11.2022), #16 (05.04.22), #17 (06.17.22), #18 (07.29.22)  - BCVA 20/70 from 20/50   - exam shows no residual IVTA in vitreous cavity  - OCT OD today shows interval improvement in temporal IRF/IRHM; OS - stable from prior  - recommend IVTA OD #19 today, 09.16.22  - pt wishes to proceed  - RBA of procedure discussed, questions answered  - informed consent obtained  - see procedure note -- AC tap  - f/u in 7 wks -- DFE/OCT/possible injection  2,3. Severe Non-proliferative diabetic retinopathy OU.  - OCT shows diabetic macular edema, OD   - recommend IVTA OD today, 09.16.22, as above  4,5. Hypertensive retinopathy OU  - discussed importance of tight BP control  - monitor   6. Mixed form age related cataract OS  - under the expert management of Dr. Kathlen Mody  7. Pseudophakia OD  - s/p CE/IOL OD (9.21.20) by expert  surgeon, Dr. Kathlen Mody  - IOL in excellent position  - had post op IOP spike -- IOP now under better control  - monitor   8. POAG OD  - under the expert management of Dr. Kathlen Mody  - s/p SLT and goniotomy OD  - had post-cataract IOP spike  - IOP 15 today   - AC tap post IVTA injection  Ophthalmic Meds Ordered this visit:  Meds ordered this encounter  Medications   triamcinolone acetonide (KENALOG-40) injection 4 mg       Return in about 6 weeks (around 11/10/2020) for f/u CRVO, DFE, OCT.  There are no Patient Instructions on file for this visit.   This document serves as a record of services personally performed by Gardiner Sleeper, MD, PhD. It was created on their behalf by Leonie Douglas, an ophthalmic technician. The creation of this record is the provider's dictation and/or activities during the visit.    Electronically signed by: Leonie Douglas COA, 10/01/20   10:03 PM  This document serves as a record of services personally performed by Gardiner Sleeper, MD, PhD. It was created on their behalf by San Jetty. Owens Shark, OA an ophthalmic technician. The creation of this record is the provider's dictation and/or activities during the visit.    Electronically signed by: San Jetty. Marguerita Merles 09.16.2022 10:03 PM  Gardiner Sleeper, M.D., Ph.D. Diseases & Surgery of the Retina and [place] 09/29/2020   I have reviewed the above documentation for accuracy and completeness, and I agree with the above. Gardiner Sleeper, M.D., Ph.D. 10/01/20 10:09 PM   Abbreviations: M myopia (nearsighted); A astigmatism; H hyperopia (farsighted); P presbyopia; Mrx spectacle prescription;  CTL contact lenses; OD right eye; OS left eye; OU both eyes  XT exotropia; ET esotropia; PEK punctate epithelial keratitis; PEE punctate epithelial erosions; DES dry eye syndrome; MGD meibomian gland dysfunction; ATs artificial tears; PFAT's preservative free artificial tears; [place] nuclear sclerotic cataract; PSC posterior subcapsular cataract; ERM epi-retinal membrane; PVD posterior vitreous detachment; RD retinal detachment; DM diabetes mellitus; DR diabetic retinopathy; NPDR non-proliferative diabetic retinopathy; PDR proliferative diabetic retinopathy; CSME clinically significant macular edema; DME diabetic macular edema; dbh dot blot hemorrhages; CWS cotton wool spot; POAG primary open angle glaucoma; C/D cup-to-disc ratio; HVF humphrey visual field; GVF goldmann visual field; OCT optical coherence tomography; IOP intraocular pressure; BRVO Branch retinal vein occlusion; CRVO central retinal vein occlusion; CRAO central retinal artery occlusion; BRAO branch retinal artery occlusion; RT retinal tear; SB scleral buckle; PPV pars plana vitrectomy; VH Vitreous hemorrhage; PRP panretinal laser photocoagulation; IVK intravitreal kenalog; VMT vitreomacular traction; MH Macular  hole;  NVD neovascularization of the disc; NVE neovascularization elsewhere; AREDS age related eye disease study; ARMD age related macular degeneration; POAG primary open angle glaucoma; EBMD epithelial/anterior basement membrane dystrophy; ACIOL anterior chamber intraocular lens; IOL intraocular lens; PCIOL posterior chamber intraocular lens; Phaco/IOL phacoemulsification with intraocular lens placement; [place] photorefractive keratectomy; LASIK laser assisted in situ keratomileusis; HTN hypertension; DM diabetes mellitus; COPD chronic obstructive pulmonary disease

## 2020-09-29 ENCOUNTER — Encounter (INDEPENDENT_AMBULATORY_CARE_PROVIDER_SITE_OTHER): Payer: Self-pay | Admitting: Ophthalmology

## 2020-09-29 ENCOUNTER — Ambulatory Visit (INDEPENDENT_AMBULATORY_CARE_PROVIDER_SITE_OTHER): Payer: Medicare Other | Admitting: Ophthalmology

## 2020-09-29 ENCOUNTER — Other Ambulatory Visit: Payer: Self-pay

## 2020-09-29 DIAGNOSIS — H35033 Hypertensive retinopathy, bilateral: Secondary | ICD-10-CM

## 2020-09-29 DIAGNOSIS — I10 Essential (primary) hypertension: Secondary | ICD-10-CM

## 2020-09-29 DIAGNOSIS — H3581 Retinal edema: Secondary | ICD-10-CM

## 2020-09-29 DIAGNOSIS — E113413 Type 2 diabetes mellitus with severe nonproliferative diabetic retinopathy with macular edema, bilateral: Secondary | ICD-10-CM

## 2020-09-29 DIAGNOSIS — Z961 Presence of intraocular lens: Secondary | ICD-10-CM

## 2020-09-29 DIAGNOSIS — H34813 Central retinal vein occlusion, bilateral, with macular edema: Secondary | ICD-10-CM | POA: Diagnosis not present

## 2020-09-29 DIAGNOSIS — H25812 Combined forms of age-related cataract, left eye: Secondary | ICD-10-CM

## 2020-10-01 MED ORDER — TRIAMCINOLONE ACETONIDE 40 MG/ML IJ SUSP FOR KALEIDOSCOPE
4.0000 mg | INTRAMUSCULAR | Status: AC | PRN
  Administered 2020-09-29: 4 mg via INTRAVITREAL

## 2020-10-02 ENCOUNTER — Encounter: Payer: Self-pay | Admitting: Internal Medicine

## 2020-10-02 LAB — HM DIABETES EYE EXAM

## 2020-10-12 NOTE — Telephone Encounter (Signed)
Error

## 2020-10-17 DIAGNOSIS — I890 Lymphedema, not elsewhere classified: Secondary | ICD-10-CM | POA: Diagnosis not present

## 2020-10-17 DIAGNOSIS — L89892 Pressure ulcer of other site, stage 2: Secondary | ICD-10-CM | POA: Diagnosis not present

## 2020-10-17 DIAGNOSIS — M170 Bilateral primary osteoarthritis of knee: Secondary | ICD-10-CM | POA: Diagnosis not present

## 2020-10-17 DIAGNOSIS — I129 Hypertensive chronic kidney disease with stage 1 through stage 4 chronic kidney disease, or unspecified chronic kidney disease: Secondary | ICD-10-CM | POA: Diagnosis not present

## 2020-10-17 DIAGNOSIS — E1122 Type 2 diabetes mellitus with diabetic chronic kidney disease: Secondary | ICD-10-CM | POA: Diagnosis not present

## 2020-10-17 DIAGNOSIS — N183 Chronic kidney disease, stage 3 unspecified: Secondary | ICD-10-CM | POA: Diagnosis not present

## 2020-10-17 DIAGNOSIS — R262 Difficulty in walking, not elsewhere classified: Secondary | ICD-10-CM | POA: Diagnosis not present

## 2020-10-23 DIAGNOSIS — Z96659 Presence of unspecified artificial knee joint: Secondary | ICD-10-CM | POA: Diagnosis not present

## 2020-10-23 DIAGNOSIS — M170 Bilateral primary osteoarthritis of knee: Secondary | ICD-10-CM | POA: Diagnosis not present

## 2020-10-23 DIAGNOSIS — I129 Hypertensive chronic kidney disease with stage 1 through stage 4 chronic kidney disease, or unspecified chronic kidney disease: Secondary | ICD-10-CM | POA: Diagnosis not present

## 2020-10-23 DIAGNOSIS — E1122 Type 2 diabetes mellitus with diabetic chronic kidney disease: Secondary | ICD-10-CM | POA: Diagnosis not present

## 2020-10-24 DIAGNOSIS — R262 Difficulty in walking, not elsewhere classified: Secondary | ICD-10-CM | POA: Diagnosis not present

## 2020-10-24 DIAGNOSIS — I890 Lymphedema, not elsewhere classified: Secondary | ICD-10-CM | POA: Diagnosis not present

## 2020-10-24 DIAGNOSIS — L89892 Pressure ulcer of other site, stage 2: Secondary | ICD-10-CM | POA: Diagnosis not present

## 2020-10-24 DIAGNOSIS — M170 Bilateral primary osteoarthritis of knee: Secondary | ICD-10-CM | POA: Diagnosis not present

## 2020-10-24 DIAGNOSIS — N183 Chronic kidney disease, stage 3 unspecified: Secondary | ICD-10-CM | POA: Diagnosis not present

## 2020-10-24 DIAGNOSIS — E1122 Type 2 diabetes mellitus with diabetic chronic kidney disease: Secondary | ICD-10-CM | POA: Diagnosis not present

## 2020-10-24 DIAGNOSIS — I129 Hypertensive chronic kidney disease with stage 1 through stage 4 chronic kidney disease, or unspecified chronic kidney disease: Secondary | ICD-10-CM | POA: Diagnosis not present

## 2020-10-26 DIAGNOSIS — I129 Hypertensive chronic kidney disease with stage 1 through stage 4 chronic kidney disease, or unspecified chronic kidney disease: Secondary | ICD-10-CM | POA: Diagnosis not present

## 2020-10-26 DIAGNOSIS — M170 Bilateral primary osteoarthritis of knee: Secondary | ICD-10-CM | POA: Diagnosis not present

## 2020-10-26 DIAGNOSIS — R262 Difficulty in walking, not elsewhere classified: Secondary | ICD-10-CM | POA: Diagnosis not present

## 2020-10-26 DIAGNOSIS — L89892 Pressure ulcer of other site, stage 2: Secondary | ICD-10-CM | POA: Diagnosis not present

## 2020-10-26 DIAGNOSIS — N183 Chronic kidney disease, stage 3 unspecified: Secondary | ICD-10-CM | POA: Diagnosis not present

## 2020-10-26 DIAGNOSIS — I890 Lymphedema, not elsewhere classified: Secondary | ICD-10-CM | POA: Diagnosis not present

## 2020-10-26 DIAGNOSIS — E1122 Type 2 diabetes mellitus with diabetic chronic kidney disease: Secondary | ICD-10-CM | POA: Diagnosis not present

## 2020-11-01 DIAGNOSIS — E1122 Type 2 diabetes mellitus with diabetic chronic kidney disease: Secondary | ICD-10-CM | POA: Diagnosis not present

## 2020-11-01 DIAGNOSIS — M170 Bilateral primary osteoarthritis of knee: Secondary | ICD-10-CM | POA: Diagnosis not present

## 2020-11-01 DIAGNOSIS — I129 Hypertensive chronic kidney disease with stage 1 through stage 4 chronic kidney disease, or unspecified chronic kidney disease: Secondary | ICD-10-CM | POA: Diagnosis not present

## 2020-11-01 DIAGNOSIS — I890 Lymphedema, not elsewhere classified: Secondary | ICD-10-CM | POA: Diagnosis not present

## 2020-11-01 DIAGNOSIS — L89892 Pressure ulcer of other site, stage 2: Secondary | ICD-10-CM | POA: Diagnosis not present

## 2020-11-01 DIAGNOSIS — N183 Chronic kidney disease, stage 3 unspecified: Secondary | ICD-10-CM | POA: Diagnosis not present

## 2020-11-01 DIAGNOSIS — R262 Difficulty in walking, not elsewhere classified: Secondary | ICD-10-CM | POA: Diagnosis not present

## 2020-11-02 DIAGNOSIS — N183 Chronic kidney disease, stage 3 unspecified: Secondary | ICD-10-CM | POA: Diagnosis not present

## 2020-11-02 DIAGNOSIS — M170 Bilateral primary osteoarthritis of knee: Secondary | ICD-10-CM | POA: Diagnosis not present

## 2020-11-02 DIAGNOSIS — I890 Lymphedema, not elsewhere classified: Secondary | ICD-10-CM | POA: Diagnosis not present

## 2020-11-02 DIAGNOSIS — E1122 Type 2 diabetes mellitus with diabetic chronic kidney disease: Secondary | ICD-10-CM | POA: Diagnosis not present

## 2020-11-02 DIAGNOSIS — I129 Hypertensive chronic kidney disease with stage 1 through stage 4 chronic kidney disease, or unspecified chronic kidney disease: Secondary | ICD-10-CM | POA: Diagnosis not present

## 2020-11-02 DIAGNOSIS — L89892 Pressure ulcer of other site, stage 2: Secondary | ICD-10-CM | POA: Diagnosis not present

## 2020-11-02 DIAGNOSIS — R262 Difficulty in walking, not elsewhere classified: Secondary | ICD-10-CM | POA: Diagnosis not present

## 2020-11-06 DIAGNOSIS — I890 Lymphedema, not elsewhere classified: Secondary | ICD-10-CM | POA: Diagnosis not present

## 2020-11-06 DIAGNOSIS — N183 Chronic kidney disease, stage 3 unspecified: Secondary | ICD-10-CM | POA: Diagnosis not present

## 2020-11-06 DIAGNOSIS — I129 Hypertensive chronic kidney disease with stage 1 through stage 4 chronic kidney disease, or unspecified chronic kidney disease: Secondary | ICD-10-CM | POA: Diagnosis not present

## 2020-11-06 DIAGNOSIS — M170 Bilateral primary osteoarthritis of knee: Secondary | ICD-10-CM | POA: Diagnosis not present

## 2020-11-06 DIAGNOSIS — R262 Difficulty in walking, not elsewhere classified: Secondary | ICD-10-CM | POA: Diagnosis not present

## 2020-11-06 DIAGNOSIS — Z9181 History of falling: Secondary | ICD-10-CM | POA: Diagnosis not present

## 2020-11-06 DIAGNOSIS — L89892 Pressure ulcer of other site, stage 2: Secondary | ICD-10-CM | POA: Diagnosis not present

## 2020-11-06 DIAGNOSIS — E1122 Type 2 diabetes mellitus with diabetic chronic kidney disease: Secondary | ICD-10-CM | POA: Diagnosis not present

## 2020-11-06 NOTE — Progress Notes (Signed)
[facility] Note  11/10/2020     CHIEF COMPLAINT Patient presents for Retina Follow Up   HISTORY OF PRESENT ILLNESS: Mary [name] is a 75 y.o. female who presents to the clinic today for:   HPI     Retina Follow Up   Patient presents with  CRVO/BRVO.  In both eyes.  Severity is mild.  Duration of 6 weeks.  Since onset it is gradually improving.  I, the attending physician,  performed the HPI with the patient and updated documentation appropriately.        Comments   Pt here for 6 wk ret f/u for CRVO OU. Pt states she feels vision may have improved since last visit. She does report being placed on Brimonidine BID OD, still doing Dorzolamide/Timolol BID OD per Dr. Kathlen Mody.       Last edited by Bernarda Caffey, MD on 11/12/2020  2:21 AM.    Pt states she feels vision may have improved since last visit. Added brimonidine bid OD and still using generic cosopt bid OD.   Referring physician: Glendale Chard, MD [address] [place],  [place] [address]  HISTORICAL INFORMATION:   Selected notes from the MEDICAL RECORD NUMBER Transferred care from Dr. Zigmund Daniel   CURRENT MEDICATIONS: Current Outpatient Medications (Ophthalmic Drugs)  Medication Sig   brimonidine (ALPHAGAN) 0.2 % ophthalmic solution Place 1 drop into both eyes 2 (two) times daily.   timolol (BETIMOL) 0.25 % ophthalmic solution Place 1 drop into the right eye 2 (two) times daily.   No current facility-administered medications for this visit. (Ophthalmic Drugs)   Current Outpatient Medications (Other)  Medication Sig   aspirin 81 MG chewable tablet 1 tablet   Assure Comfort Lancets 28G MISC    Blood Glucose Monitoring Suppl (ONETOUCH VERIO FLEX SYSTEM) w/Device KIT Use as directed to check blood sugars 2 times per day dx: e11.65   cephALEXin (KEFLEX) 500 MG capsule Take 500 mg by mouth. (Patient not taking: Reported on 09/29/2020)   fluticasone (FLONASE) 50 MCG/ACT nasal  spray SPRAY 1 SPRAY INTO EACH NOSTRIL EVERY DAY   furosemide (LASIX) 40 MG tablet TAKE 1 TABLET BY MOUTH  DAILY   glucose blood (ONETOUCH VERIO) test strip Use as directed to check blood sugars 2 times per day dx: e11.65   HYDROcodone-acetaminophen (NORCO) 10-325 MG tablet Take 1 tablet by mouth 5 (five) times daily as needed.   KLOR-CON M20 20 MEQ tablet Take 20 mEq by mouth daily. with food   levothyroxine (SYNTHROID) 125 MCG tablet Take 125 mcg by mouth daily before breakfast.   magnesium oxide (MAG-OX) 400 MG tablet TAKE 1 TABLET BY MOUTH EVERY DAY IN THE EVENING   metoprolol succinate (TOPROL-XL) 50 MG 24 hr tablet TAKE 1 TABLET BY MOUTH  DAILY   omeprazole (PRILOSEC) 20 MG capsule TAKE 1 CAPSULE BY MOUTH  DAILY   OneTouch Delica Lancets 00X MISC Use as directed to check blood sugars 2 times per day dx: e11.65   pravastatin (PRAVACHOL) 40 MG tablet TAKE 1 TABLET BY MOUTH EVERY DAY   telmisartan (MICARDIS) 80 MG tablet TAKE 1 TABLET BY MOUTH  DAILY   triamcinolone (KENALOG) 0.1 % APPLY TO AFFECTED AREA TWICE A DAY   No current facility-administered medications for this visit. (Other)   REVIEW OF SYSTEMS: ROS   Positive for: Genitourinary, Musculoskeletal, Endocrine, Eyes Negative for: Constitutional, Gastrointestinal, Neurological, Skin, HENT, Cardiovascular, Respiratory, Psychiatric, Allergic/Imm, Heme/Lymph Last edited by Orvan Falconer  E, COT on 11/10/2020  8:42 AM.      ALLERGIES No Known Allergies  PAST MEDICAL HISTORY Past Medical History:  Diagnosis Date   Arthritis    osteoarthritis   Cataract    OS   Diabetes mellitus    Insulin dependent   Diabetic retinopathy ([place])    NPDR OU   Glaucoma    POAG OD   H/O cardiovascular stress test    pt. reports 10/13- was normal per pt.    Hyperlipemia    Hypertension    Hypertensive retinopathy    OU   Past Surgical History:  Procedure Laterality Date   CARPAL TUNNEL RELEASE Bilateral    CATARACT EXTRACTION Right  10/05/2018   Dr. Kathlen Mody   EYE SURGERY Right    Cat Sx   KNEE ARTHROSCOPY     right   QUADRICEPS TENDON REPAIR  02/17/2012   Procedure: REPAIR QUADRICEP TENDON;  Surgeon: Vickey Huger, MD;  Location: [place];  Service: Orthopedics;  Laterality: Right;  right quadriceps repair with latera release   QUADRICEPS TENDON REPAIR Right 05/15/2012   Procedure: REPAIR QUADRICEP TENDON;  Surgeon: Vickey Huger, MD;  Location: [place];  Service: Orthopedics;  Laterality: Right;   TOTAL KNEE ARTHROPLASTY  10/21/2011   Procedure: TOTAL KNEE ARTHROPLASTY;  Surgeon: Rudean Haskell, MD;  Location: [place];  Service: Orthopedics;  Laterality: Right;   TUBAL LIGATION      FAMILY HISTORY Family History  Problem Relation Age of Onset   Hypertension Mother    Heart Problems Father    Gout Father    Arthritis Father     SOCIAL HISTORY Social History   Tobacco Use   Smoking status: Former    Packs/day: 1.00    Years: 30.00    Pack years: 30.00    Types: Cigarettes    Start date: 01/15/1967    Quit date: 06/28/2014    Years since quitting: 6.3   Smokeless tobacco: Never  Vaping Use   Vaping Use: Never used  Substance Use Topics   Alcohol use: No   Drug use: Yes    Types: Hydrocodone       OPHTHALMIC EXAM: Base Eye Exam     Visual Acuity (Snellen - Linear)       Right Left   Dist [place] 20/70 HM   Dist ph [place] NI NI    Correction: Glasses         Tonometry (Tonopen, 8:50 AM)       Right Left   Pressure 12 16         Pupils       Dark Light Shape React APD   Right 3 2 Round Minimal None   Left 2 2 Round NR None         Visual Fields       Left Right     Full   Restrictions Total superior temporal, inferior temporal, superior nasal, inferior nasal deficiencies          Extraocular Movement       Right Left    Full LXT         Neuro/Psych     Oriented x3: Yes   Mood/Affect: Normal         Dilation     Both eyes: 1.0% Mydriacyl, 2.5% Phenylephrine @ 8:52 AM            Slit Lamp and Fundus Exam     Slit Lamp Exam  Right Left   Lids/Lashes Dermatochalasis - upper lid, mild Meibomian gland dysfunction Dermatochalasis - upper lid, mild Meibomian gland dysfunction   Conjunctiva/Sclera Melanosis Melanosis   Cornea 2-3+ Punctate epithelial erosions, well healed temporal cataract wounds 3+ Punctate epithelial erosions, well healed temporal cataract wounds   Anterior Chamber Deep and quiet Deep and quiet   Iris Round and moderately dilated to 5.32m Round and moderately dilated to 680m  Lens PC IOL in good position, trace, cortical remnant temporally, PC folds 2-3+ Nuclear sclerosis, 2-3+ Cortical cataract   Vitreous Vitreous syneresis, vitreous condensations, Ozurdex pellet remnants inferiorly, no residual IVT Vitreous syneresis         Fundus Exam       Right Left   Disc  2-3+Pallor, Sharp rim, temporal PPP/PPA, thin superior rim Mild Pallor, Sharp rim, PPP, severely attenuated vessels   C/D Ratio 0.7 0.3   Macula Blunted foveal reflex, [place], Retinal pigment epithelial mottling, temporal edema with exudates -- improved Flat, Blunted foveal reflex, RPE mottling and clumping, scarring, atrophy, no heme   Vessels attenuated, Tortuous Severe Vascular attenuation, +fibrosis   Periphery Attached, focal IRH temporal periphery--improved Attached, pavingstone and reticular degeneration inferiorly, scattered RPE atrophy, greatest peripapillary             Refraction     Wearing Rx       Sphere Cylinder Axis Add   Right -1.00 +0.75 180 +2.25   Left -0.50 +0.75 155 +2.25            IMAGING AND PROCEDURES  Imaging and Procedures for _0 @  OCT, Retina - OU - Both Eyes       Right Eye Quality was good. Central Foveal Thickness: 182. Progression has improved. Findings include abnormal foveal contour, intraretinal fluid, intraretinal hyper-reflective material, no SRF, vitreomacular adhesion (Mild Interval improvement in  temporal IRF, +vitreous opacities).   Left Eye Quality was good. Central Foveal Thickness: 196. Progression has been stable. Findings include abnormal foveal contour, no IRF, no SRF, outer retinal atrophy, epiretinal membrane, preretinal fibrosis, subretinal hyper-reflective material, pigment epithelial detachment (Stable from prior).   Notes *Images captured and stored on drive  Diagnosis / Impression:  OD: Mild Interval improvement in temporal IRF, +vitreous opacities OS: diffuse ORA - Stable from prior  Clinical management:  See below  Abbreviations: NFP - Normal foveal profile. CME - cystoid macular edema. PED - pigment epithelial detachment. IRF - intraretinal fluid. SRF - subretinal fluid. EZ - ellipsoid zone. ERM - epiretinal membrane. ORA - outer retinal atrophy. ORT - outer retinal tubulation. SRHM - subretinal hyper-reflective material      Intravitreal Injection, Pharmacologic Agent - OD - Right Eye       Time Out 11/10/2020. 9:34 AM. Confirmed correct patient, procedure, site, and patient consented.   Anesthesia Topical anesthesia was used. Anesthetic medications included Lidocaine 2%, Proparacaine 0.5%.   Procedure Preparation included 5% betadine to ocular surface, eyelid speculum. A 27 gauge needle was used.   Injection: 4 mg triamcinolone acetonide 40 MG/ML   Route: Intravitreal, Site: Right Eye   NDC: [phone]: : [id number] date: 06/13/2021   Post-op Post injection exam found visual acuity of at least counting fingers. The patient tolerated the procedure well. There were no complications. The patient received written and verbal post procedure care education. Post injection medications were not given.   Notes An AC tap was performed following injection due to elevated IOP using a 30 gauge needle on a syringe with the plunger  removed. The needle was placed at the limbus at 7 oclock and approximately 0.08 cc of aqueous was removed from the anterior  chamber. Betadine was applied to the tap area before and after the paracentesis was performed. There were no complications. The patient tolerated the procedure well. The IOP was rechecked and was found to be ~10 mmHg by tonopen.             ASSESSMENT/PLAN:    ICD-10-CM   1. Central retinal vein occlusion with macular edema of both eyes  H34.8130 Intravitreal Injection, Pharmacologic Agent - OD - Right Eye    triamcinolone acetonide (KENALOG-40) injection 4 mg    2. Severe nonproliferative diabetic retinopathy of both eyes with macular edema associated with type 2 diabetes mellitus ([place])  V78.5885     3. Retinal edema  H35.81 OCT, Retina - OU - Both Eyes    4. Essential hypertension  I10     5. Hypertensive retinopathy of both eyes  H35.033     6. Combined forms of age-related cataract of left eye  H25.812     7. Pseudophakia  Z96.1     8. Primary open angle glaucoma (POAG) of right eye, mild stage  H40.1111 brimonidine (ALPHAGAN) 0.2 % ophthalmic solution     1. CRVO w/ CME OU  - pt of Dr. Zigmund Daniel who wished to transfer care  - OS long-standing low vision (CF) with subfoveal scar and central retinal atrophy  - OD with CME actively being managed with Ozurdex OD ~q5 wks by Dr. Zigmund Daniel (last on 8.20.20) s/p multiple IVTA and Ozurdex OD  - was due for Ozurdex OD w/ Zigmund Daniel on 9.25.20, but underwent cataract surgery OD w/ Kathlen Mody on 9.21.20  - s/p IVTA OD #4 (10.26.20), #5 (12.10.20), #6 (01.21.21), #7 (03.05.21), #8 (04.16.21), #9 (05.28.21), #10 (07.09.21), #11 (09.14.21), #12 (10.29.21), #13 (12.17.21), #14 (01.27.22), #15 (03.11.2022), #16 (05.04.22), #17 (06.17.22), #18 (07.29.22), #19 (09.16.22)  - BCVA stable at 20/70  - exam shows no residual IVTA in vitreous cavity  - OCT OD today shows interval improvement in temporal IRF/IRHM at 6 wks -- almost resolved; OS - stable from prior  - recommend IVTA OD #20 today, 10.28.22 w/ ext to 7 wks  - pt wishes to proceed  - RBA of  procedure discussed, questions answered  - informed consent obtained  - see procedure note -- AC tap  - f/u in 7 wks -- DFE/OCT/possible injection  2,3. Severe Non-proliferative diabetic retinopathy OU.  - OCT shows diabetic macular edema, OD   - recommend IVTA OD today, 10.28.22, as above  4,5. Hypertensive retinopathy OU  - discussed importance of tight BP control  - monitor   6. Mixed form age related cataract OS  - under the expert management of Dr. Kathlen Mody  7. Pseudophakia OD  - s/p CE/IOL OD (9.21.20) by expert surgeon, Dr. Kathlen Mody  - IOL in excellent position  - had post op IOP spike -- IOP now under better control  - monitor   8. POAG OD  - under the expert management of Dr. Kathlen Mody  - s/p SLT and goniotomy OD  - had post-cataract IOP spike  - on brimonidine BID OU  - IOP 12 today  - AC tap post IVTA injection  Ophthalmic Meds Ordered this visit:  Meds ordered this encounter  Medications   brimonidine (ALPHAGAN) 0.2 % ophthalmic solution    Sig: Place 1 drop into both eyes 2 (two) times daily.    Dispense:  5 mL    Refill:  5   triamcinolone acetonide (KENALOG-40) injection 4 mg     Return in about 7 weeks (around 12/29/2020) for DFE, OCT.  There are no Patient Instructions on file for this visit.   This document serves as a record of services personally performed by Gardiner Sleeper, MD, PhD. It was created on their behalf by Leonie Douglas, an ophthalmic technician. The creation of this record is the provider's dictation and/or activities during the visit.    Electronically signed by: Leonie Douglas COA, 11/12/20  2:26 AM  This document serves as a record of services personally performed by Gardiner Sleeper, MD, PhD. It was created on their behalf by Roselee Nova, COMT. The creation of this record is the provider's dictation and/or activities during the visit.  Electronically signed by: Roselee Nova, COMT 11/12/20 2:26 AM  Gardiner Sleeper, M.D., Ph.D. Diseases &  Surgery of the [facility]ina and [place] 11/10/2020   I have reviewed the above documentation for accuracy and completeness, and I agree with the above. Gardiner Sleeper, M.D., Ph.D. 11/12/20 2:26 AM   Abbreviations: M myopia (nearsighted); A astigmatism; H hyperopia (farsighted); P presbyopia; Mrx spectacle prescription;  CTL contact lenses; OD right eye; OS left eye; OU both eyes  XT exotropia; ET esotropia; PEK punctate epithelial keratitis; PEE punctate epithelial erosions; DES dry eye syndrome; MGD meibomian gland dysfunction; ATs artificial tears; PFAT's preservative free artificial tears; [place] nuclear sclerotic cataract; PSC posterior subcapsular cataract; ERM epi-retinal membrane; PVD posterior vitreous detachment; RD retinal detachment; DM diabetes mellitus; DR diabetic retinopathy; NPDR non-proliferative diabetic retinopathy; PDR proliferative diabetic retinopathy; CSME clinically significant macular edema; DME diabetic macular edema; dbh dot blot hemorrhages; CWS cotton wool spot; POAG primary open angle glaucoma; C/D cup-to-disc ratio; HVF humphrey visual field; GVF goldmann visual field; OCT optical coherence tomography; IOP intraocular pressure; BRVO Branch retinal vein occlusion; CRVO central retinal vein occlusion; CRAO central retinal artery occlusion; BRAO branch retinal artery occlusion; RT retinal tear; SB scleral buckle; PPV pars plana vitrectomy; VH Vitreous hemorrhage; PRP panretinal laser photocoagulation; IVK intravitreal kenalog; VMT vitreomacular traction; MH Macular hole;  NVD neovascularization of the disc; NVE neovascularization elsewhere; AREDS age related eye disease study; ARMD age related macular degeneration; POAG primary open angle glaucoma; EBMD epithelial/anterior basement membrane dystrophy; ACIOL anterior chamber intraocular lens; IOL intraocular lens; PCIOL posterior chamber intraocular lens; Phaco/IOL phacoemulsification with intraocular  lens placement; [place] photorefractive keratectomy; LASIK laser assisted in situ keratomileusis; HTN hypertension; DM diabetes mellitus; COPD chronic obstructive pulmonary disease

## 2020-11-08 DIAGNOSIS — N183 Chronic kidney disease, stage 3 unspecified: Secondary | ICD-10-CM | POA: Diagnosis not present

## 2020-11-08 DIAGNOSIS — Z9181 History of falling: Secondary | ICD-10-CM | POA: Diagnosis not present

## 2020-11-08 DIAGNOSIS — L89892 Pressure ulcer of other site, stage 2: Secondary | ICD-10-CM | POA: Diagnosis not present

## 2020-11-08 DIAGNOSIS — I890 Lymphedema, not elsewhere classified: Secondary | ICD-10-CM | POA: Diagnosis not present

## 2020-11-08 DIAGNOSIS — E1122 Type 2 diabetes mellitus with diabetic chronic kidney disease: Secondary | ICD-10-CM | POA: Diagnosis not present

## 2020-11-08 DIAGNOSIS — I129 Hypertensive chronic kidney disease with stage 1 through stage 4 chronic kidney disease, or unspecified chronic kidney disease: Secondary | ICD-10-CM | POA: Diagnosis not present

## 2020-11-08 DIAGNOSIS — R262 Difficulty in walking, not elsewhere classified: Secondary | ICD-10-CM | POA: Diagnosis not present

## 2020-11-08 DIAGNOSIS — M170 Bilateral primary osteoarthritis of knee: Secondary | ICD-10-CM | POA: Diagnosis not present

## 2020-11-10 ENCOUNTER — Ambulatory Visit (INDEPENDENT_AMBULATORY_CARE_PROVIDER_SITE_OTHER): Payer: Medicare Other | Admitting: Ophthalmology

## 2020-11-10 ENCOUNTER — Encounter (INDEPENDENT_AMBULATORY_CARE_PROVIDER_SITE_OTHER): Payer: Self-pay | Admitting: Ophthalmology

## 2020-11-10 ENCOUNTER — Other Ambulatory Visit: Payer: Self-pay

## 2020-11-10 DIAGNOSIS — H401111 Primary open-angle glaucoma, right eye, mild stage: Secondary | ICD-10-CM | POA: Diagnosis not present

## 2020-11-10 DIAGNOSIS — E113413 Type 2 diabetes mellitus with severe nonproliferative diabetic retinopathy with macular edema, bilateral: Secondary | ICD-10-CM

## 2020-11-10 DIAGNOSIS — H34813 Central retinal vein occlusion, bilateral, with macular edema: Secondary | ICD-10-CM | POA: Diagnosis not present

## 2020-11-10 DIAGNOSIS — I10 Essential (primary) hypertension: Secondary | ICD-10-CM | POA: Diagnosis not present

## 2020-11-10 DIAGNOSIS — H3581 Retinal edema: Secondary | ICD-10-CM

## 2020-11-10 DIAGNOSIS — Z961 Presence of intraocular lens: Secondary | ICD-10-CM

## 2020-11-10 DIAGNOSIS — H25812 Combined forms of age-related cataract, left eye: Secondary | ICD-10-CM

## 2020-11-10 DIAGNOSIS — H35033 Hypertensive retinopathy, bilateral: Secondary | ICD-10-CM

## 2020-11-10 MED ORDER — BRIMONIDINE TARTRATE 0.2 % OP SOLN: 1.0000 [drp] | Freq: Two times a day (BID) | OPHTHALMIC | 5 refills | Status: DC

## 2020-11-11 DIAGNOSIS — I890 Lymphedema, not elsewhere classified: Secondary | ICD-10-CM | POA: Diagnosis not present

## 2020-11-12 ENCOUNTER — Encounter (INDEPENDENT_AMBULATORY_CARE_PROVIDER_SITE_OTHER): Payer: Self-pay | Admitting: Ophthalmology

## 2020-11-12 MED ORDER — TRIAMCINOLONE ACETONIDE 40 MG/ML IJ SUSP FOR KALEIDOSCOPE
4.0000 mg | INTRAMUSCULAR | Status: AC | PRN
  Administered 2020-11-10: 4 mg via INTRAVITREAL

## 2020-11-14 DIAGNOSIS — I129 Hypertensive chronic kidney disease with stage 1 through stage 4 chronic kidney disease, or unspecified chronic kidney disease: Secondary | ICD-10-CM | POA: Diagnosis not present

## 2020-11-14 DIAGNOSIS — N183 Chronic kidney disease, stage 3 unspecified: Secondary | ICD-10-CM | POA: Diagnosis not present

## 2020-11-14 DIAGNOSIS — E1122 Type 2 diabetes mellitus with diabetic chronic kidney disease: Secondary | ICD-10-CM | POA: Diagnosis not present

## 2020-11-14 DIAGNOSIS — M170 Bilateral primary osteoarthritis of knee: Secondary | ICD-10-CM | POA: Diagnosis not present

## 2020-11-14 DIAGNOSIS — R262 Difficulty in walking, not elsewhere classified: Secondary | ICD-10-CM | POA: Diagnosis not present

## 2020-11-14 DIAGNOSIS — I890 Lymphedema, not elsewhere classified: Secondary | ICD-10-CM | POA: Diagnosis not present

## 2020-11-14 DIAGNOSIS — L89892 Pressure ulcer of other site, stage 2: Secondary | ICD-10-CM | POA: Diagnosis not present

## 2020-11-14 DIAGNOSIS — Z9181 History of falling: Secondary | ICD-10-CM | POA: Diagnosis not present

## 2020-11-16 DIAGNOSIS — I129 Hypertensive chronic kidney disease with stage 1 through stage 4 chronic kidney disease, or unspecified chronic kidney disease: Secondary | ICD-10-CM | POA: Diagnosis not present

## 2020-11-16 DIAGNOSIS — E1122 Type 2 diabetes mellitus with diabetic chronic kidney disease: Secondary | ICD-10-CM | POA: Diagnosis not present

## 2020-11-16 DIAGNOSIS — R262 Difficulty in walking, not elsewhere classified: Secondary | ICD-10-CM | POA: Diagnosis not present

## 2020-11-16 DIAGNOSIS — I890 Lymphedema, not elsewhere classified: Secondary | ICD-10-CM | POA: Diagnosis not present

## 2020-11-16 DIAGNOSIS — L89892 Pressure ulcer of other site, stage 2: Secondary | ICD-10-CM | POA: Diagnosis not present

## 2020-11-16 DIAGNOSIS — M170 Bilateral primary osteoarthritis of knee: Secondary | ICD-10-CM | POA: Diagnosis not present

## 2020-11-16 DIAGNOSIS — Z9181 History of falling: Secondary | ICD-10-CM | POA: Diagnosis not present

## 2020-11-16 DIAGNOSIS — N183 Chronic kidney disease, stage 3 unspecified: Secondary | ICD-10-CM | POA: Diagnosis not present

## 2020-11-20 DIAGNOSIS — R262 Difficulty in walking, not elsewhere classified: Secondary | ICD-10-CM | POA: Diagnosis not present

## 2020-11-20 DIAGNOSIS — N183 Chronic kidney disease, stage 3 unspecified: Secondary | ICD-10-CM | POA: Diagnosis not present

## 2020-11-20 DIAGNOSIS — I890 Lymphedema, not elsewhere classified: Secondary | ICD-10-CM | POA: Diagnosis not present

## 2020-11-20 DIAGNOSIS — Z9181 History of falling: Secondary | ICD-10-CM | POA: Diagnosis not present

## 2020-11-20 DIAGNOSIS — L89892 Pressure ulcer of other site, stage 2: Secondary | ICD-10-CM | POA: Diagnosis not present

## 2020-11-20 DIAGNOSIS — E1122 Type 2 diabetes mellitus with diabetic chronic kidney disease: Secondary | ICD-10-CM | POA: Diagnosis not present

## 2020-11-20 DIAGNOSIS — M170 Bilateral primary osteoarthritis of knee: Secondary | ICD-10-CM | POA: Diagnosis not present

## 2020-11-20 DIAGNOSIS — I129 Hypertensive chronic kidney disease with stage 1 through stage 4 chronic kidney disease, or unspecified chronic kidney disease: Secondary | ICD-10-CM | POA: Diagnosis not present

## 2020-11-23 DIAGNOSIS — R262 Difficulty in walking, not elsewhere classified: Secondary | ICD-10-CM | POA: Diagnosis not present

## 2020-11-23 DIAGNOSIS — Z9181 History of falling: Secondary | ICD-10-CM | POA: Diagnosis not present

## 2020-11-23 DIAGNOSIS — N183 Chronic kidney disease, stage 3 unspecified: Secondary | ICD-10-CM | POA: Diagnosis not present

## 2020-11-23 DIAGNOSIS — I129 Hypertensive chronic kidney disease with stage 1 through stage 4 chronic kidney disease, or unspecified chronic kidney disease: Secondary | ICD-10-CM | POA: Diagnosis not present

## 2020-11-23 DIAGNOSIS — Z96659 Presence of unspecified artificial knee joint: Secondary | ICD-10-CM | POA: Diagnosis not present

## 2020-11-23 DIAGNOSIS — L89892 Pressure ulcer of other site, stage 2: Secondary | ICD-10-CM | POA: Diagnosis not present

## 2020-11-23 DIAGNOSIS — E1122 Type 2 diabetes mellitus with diabetic chronic kidney disease: Secondary | ICD-10-CM | POA: Diagnosis not present

## 2020-11-23 DIAGNOSIS — L89893 Pressure ulcer of other site, stage 3: Secondary | ICD-10-CM | POA: Diagnosis not present

## 2020-11-23 DIAGNOSIS — M170 Bilateral primary osteoarthritis of knee: Secondary | ICD-10-CM | POA: Diagnosis not present

## 2020-11-23 DIAGNOSIS — I890 Lymphedema, not elsewhere classified: Secondary | ICD-10-CM | POA: Diagnosis not present

## 2020-11-27 ENCOUNTER — Ambulatory Visit (INDEPENDENT_AMBULATORY_CARE_PROVIDER_SITE_OTHER): Payer: Medicare Other

## 2020-11-27 DIAGNOSIS — E1122 Type 2 diabetes mellitus with diabetic chronic kidney disease: Secondary | ICD-10-CM

## 2020-11-27 DIAGNOSIS — I129 Hypertensive chronic kidney disease with stage 1 through stage 4 chronic kidney disease, or unspecified chronic kidney disease: Secondary | ICD-10-CM

## 2020-11-27 DIAGNOSIS — N183 Chronic kidney disease, stage 3 unspecified: Secondary | ICD-10-CM

## 2020-11-27 NOTE — Chronic Care Management (AMB) (Signed)
Chronic Care Management    Social Work Note  11/27/2020 Name: Mary [name] MRN: [id number] DOB: April 10, 1945  Mary [name] is a 75 y.o. year old female who is a primary care patient of Glendale Chard, MD. The CCM team was consulted to assist the patient with chronic disease management and/or care coordination needs related to:  DM, CKD III, HTN .   Engaged with patient by telephone for follow up visit in response to provider referral for social work chronic care management and care coordination services.   Consent to Services:  The patient was given information about Chronic Care Management services, agreed to services, and gave verbal consent prior to initiation of services.  Please see initial visit note for detailed documentation.   Patient agreed to services and consent obtained.   Assessment: Review of patient past medical history, allergies, medications, and health status, including review of relevant consultants reports was performed today as part of a comprehensive evaluation and provision of chronic care management and care coordination services.     SDOH (Social Determinants of Health) assessments and interventions performed:    Advanced Directives Status: Not addressed in this encounter.  CCM Care Plan  No Known Allergies  Outpatient Encounter Medications as of 11/27/2020  Medication Sig Note   aspirin 81 MG chewable tablet 1 tablet    Assure Comfort Lancets 28G MISC     Blood Glucose Monitoring Suppl (ONETOUCH VERIO FLEX SYSTEM) w/Device KIT Use as directed to check blood sugars 2 times per day dx: e11.65    brimonidine (ALPHAGAN) 0.2 % ophthalmic solution Place 1 drop into both eyes 2 (two) times daily.    cephALEXin (KEFLEX) 500 MG capsule Take 500 mg by mouth. (Patient not taking: Reported on 09/29/2020)    fluticasone (FLONASE) 50 MCG/ACT nasal spray SPRAY 1 SPRAY INTO EACH NOSTRIL EVERY DAY    furosemide (LASIX) 40 MG tablet TAKE 1 TABLET BY MOUTH  DAILY     glucose blood (ONETOUCH VERIO) test strip Use as directed to check blood sugars 2 times per day dx: e11.65    HYDROcodone-acetaminophen (NORCO) 10-325 MG tablet Take 1 tablet by mouth 5 (five) times daily as needed.    KLOR-CON M20 20 MEQ tablet Take 20 mEq by mouth daily. with food 02/23/2018: Patient instructed to hold this medication due to abnormal Potassium level of 6.2 on 02/18/18, per MD, Dr. Glendale Chard. Patient was given verbal education related to complications from hypo and hyperkalemia and importance of following MD recommendations. Patient instructed to resume this medication when directed by PCP. Patient verbalizes understanding.    levothyroxine (SYNTHROID) 125 MCG tablet Take 125 mcg by mouth daily before breakfast.    magnesium oxide (MAG-OX) 400 MG tablet TAKE 1 TABLET BY MOUTH EVERY DAY IN THE EVENING    metoprolol succinate (TOPROL-XL) 50 MG 24 hr tablet TAKE 1 TABLET BY MOUTH  DAILY    omeprazole (PRILOSEC) 20 MG capsule TAKE 1 CAPSULE BY MOUTH  DAILY    OneTouch Delica Lancets 95G MISC Use as directed to check blood sugars 2 times per day dx: e11.65    pravastatin (PRAVACHOL) 40 MG tablet TAKE 1 TABLET BY MOUTH EVERY DAY    telmisartan (MICARDIS) 80 MG tablet TAKE 1 TABLET BY MOUTH  DAILY    timolol (BETIMOL) 0.25 % ophthalmic solution Place 1 drop into the right eye 2 (two) times daily.    triamcinolone (KENALOG) 0.1 % APPLY TO AFFECTED AREA TWICE A DAY    No  facility-administered encounter medications on file as of 11/27/2020.    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   Primary osteoarthritis of both knees 02/21/2018   Class 3 severe obesity due to excess calories with serious comorbidity and body mass index (BMI) of 45.0 to 49.9 in adult [facility])  02/21/2018   Skin ulcer of right foot with fat layer exposed ([place]) 02/18/2018   Morbid (severe) obesity due to excess calories ([place]) 11/18/2017   Knee pain 07/16/2015   Lateral dislocation of right patella 05/04/2015   Rupture of right quadriceps tendon 05/24/2014   S/P TKR (total knee replacement) 05/05/2012    Conditions to be addressed/monitored: HTN, DMII, and CKD Stage III ; Limited access to caregiver  Care Plan : Social Work Care Plan  Updates made by [name] since 11/27/2020 12:00 AM     Problem: Care Coordination      Long-Range Goal: Identify resources to assist with care coordination needs   Start Date: 02/15/2020  This Visit's Progress: On track  Recent Progress: On track  Priority: Low  Note:   Current Barriers:  Limited social support Transportation ADL IADL limitations Chronic conditions including DM II, CKD III, and obesity which put patient at increased risk for hospitalization   Social Work Clinical Goal(s):  patient will work with SW to address concerns related to care coordination  Interventions: 1:1 collaboration with Glendale Chard, MD regarding development and update of comprehensive plan of care as evidenced by provider attestation and co-signature Inter-disciplinary care team collaboration (see longitudinal plan of care) Successful outbound call placed to the patient to assess for care coordination needs Discussed the patient is doing well at this time Patient reports she has completed an in home assessment for a CAP caregiver to begin working with her- she is awaiting for a level 2 aid to be available based on patient care needs SDoH screen performed - no acute challenges at this time Scheduled follow up call over the next 90 days  Patient Goals/Self-Care Activities  patient will:   - Engage with Cap program to identify a caregiver in the home -Contact SW as needed prior to next scheduled call  Follow up Plan: SW will follow up with  patient by phone over the next 90 days       Follow Up Plan: SW will follow up with patient by phone over the next 90 days      [name], BSW, CDP Social Worker, Certified Dementia Practitioner [place] [facility] [phone]

## 2020-11-27 NOTE — Patient Instructions (Signed)
Social Worker Visit Information  Goals we discussed today:   Goals Addressed             This Visit's Progress    work with SW to manage care coordination needs       Timeframe:  Long-Range Goal Priority:  Low Start Date:  2.1.22                                               Next planned outreach: 2.6.23  Patient Goals/Self-Care Activities  patient will:    - Aeronautical engineer with Cap program to identify a caregiver in the home -Contact SW as needed prior to next scheduled call         Patient verbalizes understanding of instructions provided today and agrees to view in [place].   Follow Up Plan: SW will follow up with patient by phone over the next 90 days  Bevelyn Ngo, BSW, CDP Social Worker, Certified Dementia Practitioner [facility] Management [phone]

## 2020-11-28 DIAGNOSIS — M170 Bilateral primary osteoarthritis of knee: Secondary | ICD-10-CM | POA: Diagnosis not present

## 2020-11-28 DIAGNOSIS — L89892 Pressure ulcer of other site, stage 2: Secondary | ICD-10-CM | POA: Diagnosis not present

## 2020-11-28 DIAGNOSIS — I129 Hypertensive chronic kidney disease with stage 1 through stage 4 chronic kidney disease, or unspecified chronic kidney disease: Secondary | ICD-10-CM | POA: Diagnosis not present

## 2020-11-28 DIAGNOSIS — Z9181 History of falling: Secondary | ICD-10-CM | POA: Diagnosis not present

## 2020-11-28 DIAGNOSIS — R262 Difficulty in walking, not elsewhere classified: Secondary | ICD-10-CM | POA: Diagnosis not present

## 2020-11-28 DIAGNOSIS — E1122 Type 2 diabetes mellitus with diabetic chronic kidney disease: Secondary | ICD-10-CM | POA: Diagnosis not present

## 2020-11-28 DIAGNOSIS — I890 Lymphedema, not elsewhere classified: Secondary | ICD-10-CM | POA: Diagnosis not present

## 2020-11-28 DIAGNOSIS — N183 Chronic kidney disease, stage 3 unspecified: Secondary | ICD-10-CM | POA: Diagnosis not present

## 2020-12-03 ENCOUNTER — Other Ambulatory Visit: Payer: Self-pay | Admitting: Internal Medicine

## 2020-12-04 DIAGNOSIS — E1122 Type 2 diabetes mellitus with diabetic chronic kidney disease: Secondary | ICD-10-CM | POA: Diagnosis not present

## 2020-12-04 DIAGNOSIS — Z9181 History of falling: Secondary | ICD-10-CM | POA: Diagnosis not present

## 2020-12-04 DIAGNOSIS — R262 Difficulty in walking, not elsewhere classified: Secondary | ICD-10-CM | POA: Diagnosis not present

## 2020-12-04 DIAGNOSIS — N183 Chronic kidney disease, stage 3 unspecified: Secondary | ICD-10-CM | POA: Diagnosis not present

## 2020-12-04 DIAGNOSIS — I890 Lymphedema, not elsewhere classified: Secondary | ICD-10-CM | POA: Diagnosis not present

## 2020-12-04 DIAGNOSIS — I129 Hypertensive chronic kidney disease with stage 1 through stage 4 chronic kidney disease, or unspecified chronic kidney disease: Secondary | ICD-10-CM | POA: Diagnosis not present

## 2020-12-04 DIAGNOSIS — L89892 Pressure ulcer of other site, stage 2: Secondary | ICD-10-CM | POA: Diagnosis not present

## 2020-12-04 DIAGNOSIS — M170 Bilateral primary osteoarthritis of knee: Secondary | ICD-10-CM | POA: Diagnosis not present

## 2020-12-06 DIAGNOSIS — M150 Primary generalized (osteo)arthritis: Secondary | ICD-10-CM | POA: Diagnosis not present

## 2020-12-06 DIAGNOSIS — M25561 Pain in right knee: Secondary | ICD-10-CM | POA: Diagnosis not present

## 2020-12-06 DIAGNOSIS — M25512 Pain in left shoulder: Secondary | ICD-10-CM | POA: Diagnosis not present

## 2020-12-06 DIAGNOSIS — G894 Chronic pain syndrome: Secondary | ICD-10-CM | POA: Diagnosis not present

## 2020-12-09 DIAGNOSIS — I890 Lymphedema, not elsewhere classified: Secondary | ICD-10-CM | POA: Diagnosis not present

## 2020-12-09 DIAGNOSIS — I129 Hypertensive chronic kidney disease with stage 1 through stage 4 chronic kidney disease, or unspecified chronic kidney disease: Secondary | ICD-10-CM | POA: Diagnosis not present

## 2020-12-09 DIAGNOSIS — N183 Chronic kidney disease, stage 3 unspecified: Secondary | ICD-10-CM | POA: Diagnosis not present

## 2020-12-09 DIAGNOSIS — Z9181 History of falling: Secondary | ICD-10-CM | POA: Diagnosis not present

## 2020-12-09 DIAGNOSIS — M170 Bilateral primary osteoarthritis of knee: Secondary | ICD-10-CM | POA: Diagnosis not present

## 2020-12-09 DIAGNOSIS — E1122 Type 2 diabetes mellitus with diabetic chronic kidney disease: Secondary | ICD-10-CM | POA: Diagnosis not present

## 2020-12-09 DIAGNOSIS — R262 Difficulty in walking, not elsewhere classified: Secondary | ICD-10-CM | POA: Diagnosis not present

## 2020-12-09 DIAGNOSIS — L89892 Pressure ulcer of other site, stage 2: Secondary | ICD-10-CM | POA: Diagnosis not present

## 2020-12-12 ENCOUNTER — Ambulatory Visit (INDEPENDENT_AMBULATORY_CARE_PROVIDER_SITE_OTHER): Payer: Medicare Other | Admitting: Internal Medicine

## 2020-12-12 ENCOUNTER — Other Ambulatory Visit: Payer: Self-pay

## 2020-12-12 ENCOUNTER — Encounter: Payer: Self-pay | Admitting: Internal Medicine

## 2020-12-12 VITALS — BP 134/76 | HR 69 | Temp 98.1°F | Ht 67.0 in

## 2020-12-12 DIAGNOSIS — M170 Bilateral primary osteoarthritis of knee: Secondary | ICD-10-CM | POA: Diagnosis not present

## 2020-12-12 DIAGNOSIS — E039 Hypothyroidism, unspecified: Secondary | ICD-10-CM | POA: Diagnosis not present

## 2020-12-12 DIAGNOSIS — R197 Diarrhea, unspecified: Secondary | ICD-10-CM | POA: Diagnosis not present

## 2020-12-12 DIAGNOSIS — Z23 Encounter for immunization: Secondary | ICD-10-CM

## 2020-12-12 DIAGNOSIS — E1169 Type 2 diabetes mellitus with other specified complication: Secondary | ICD-10-CM | POA: Diagnosis not present

## 2020-12-12 DIAGNOSIS — N183 Chronic kidney disease, stage 3 unspecified: Secondary | ICD-10-CM | POA: Diagnosis not present

## 2020-12-12 DIAGNOSIS — N1831 Chronic kidney disease, stage 3a: Secondary | ICD-10-CM

## 2020-12-12 DIAGNOSIS — L84 Corns and callosities: Secondary | ICD-10-CM | POA: Diagnosis not present

## 2020-12-12 DIAGNOSIS — I129 Hypertensive chronic kidney disease with stage 1 through stage 4 chronic kidney disease, or unspecified chronic kidney disease: Secondary | ICD-10-CM | POA: Diagnosis not present

## 2020-12-12 DIAGNOSIS — E1122 Type 2 diabetes mellitus with diabetic chronic kidney disease: Secondary | ICD-10-CM

## 2020-12-12 DIAGNOSIS — Z9181 History of falling: Secondary | ICD-10-CM | POA: Diagnosis not present

## 2020-12-12 DIAGNOSIS — I890 Lymphedema, not elsewhere classified: Secondary | ICD-10-CM | POA: Diagnosis not present

## 2020-12-12 DIAGNOSIS — L89892 Pressure ulcer of other site, stage 2: Secondary | ICD-10-CM | POA: Diagnosis not present

## 2020-12-12 DIAGNOSIS — R262 Difficulty in walking, not elsewhere classified: Secondary | ICD-10-CM | POA: Diagnosis not present

## 2020-12-12 NOTE — Patient Instructions (Addendum)
STOP MAGNESIUM  Hypertension, Adult High blood pressure (hypertension) is when the force of blood pumping through the arteries is too strong. The arteries are the blood vessels that carry blood from the heart throughout the body. Hypertension forces the heart to work harder to pump blood and may cause arteries to become narrow or stiff. Untreated or uncontrolled hypertension can cause a heart attack, heart failure, a stroke, kidney disease, and other problems. A blood pressure reading consists of a higher number over a lower number. Ideally, your blood pressure should be below 120/80. The first ("top") number is called the systolic pressure. It is a measure of the pressure in your arteries as your heart beats. The second ("bottom") number is called the diastolic pressure. It is a measure of the pressure in your arteries as the heart relaxes. What are the causes? The exact cause of this condition is not known. There are some conditions that result in or are related to high blood pressure. What increases the risk? Some risk factors for high blood pressure are under your control. The following factors may make you more likely to develop this condition: Smoking. Having type 2 diabetes mellitus, high cholesterol, or both. Not getting enough exercise or physical activity. Being overweight. Having too much fat, sugar, calories, or salt (sodium) in your diet. Drinking too much alcohol. Some risk factors for high blood pressure may be difficult or impossible to change. Some of these factors include: Having chronic kidney disease. Having a family history of high blood pressure. Age. Risk increases with age. Race. You may be at higher risk if you are African American. Gender. Men are at higher risk than women before age 82. After age 45, women are at higher risk than men. Having obstructive sleep apnea. Stress. What are the signs or symptoms? High blood pressure may not cause symptoms. Very high blood  pressure (hypertensive crisis) may cause: Headache. Anxiety. Shortness of breath. Nosebleed. Nausea and vomiting. Vision changes. Severe chest pain. Seizures. How is this diagnosed? This condition is diagnosed by measuring your blood pressure while you are seated, with your arm resting on a flat surface, your legs uncrossed, and your feet flat on the floor. The cuff of the blood pressure monitor will be placed directly against the skin of your upper arm at the level of your heart. It should be measured at least twice using the same arm. Certain conditions can cause a difference in blood pressure between your right and left arms. Certain factors can cause blood pressure readings to be lower or higher than normal for a short period of time: When your blood pressure is higher when you are in a health care provider's office than when you are at home, this is called white coat hypertension. Most people with this condition do not need medicines. When your blood pressure is higher at home than when you are in a health care provider's office, this is called masked hypertension. Most people with this condition may need medicines to control blood pressure. If you have a high blood pressure reading during one visit or you have normal blood pressure with other risk factors, you may be asked to: Return on a different day to have your blood pressure checked again. Monitor your blood pressure at home for 1 week or longer. If you are diagnosed with hypertension, you may have other blood or imaging tests to help your health care provider understand your overall risk for other conditions. How is this treated? This condition is  treated by making healthy lifestyle changes, such as eating healthy foods, exercising more, and reducing your alcohol intake. Your health care provider may prescribe medicine if lifestyle changes are not enough to get your blood pressure under control, and if: Your systolic blood pressure is  above 130. Your diastolic blood pressure is above 80. Your personal target blood pressure may vary depending on your medical conditions, your age, and other factors. Follow these instructions at home: Eating and drinking  Eat a diet that is high in fiber and potassium, and low in sodium, added sugar, and fat. An example eating plan is called the DASH (Dietary Approaches to Stop Hypertension) diet. To eat this way: Eat plenty of fresh fruits and vegetables. Try to fill one half of your plate at each meal with fruits and vegetables. Eat whole grains, such as whole-wheat pasta, brown rice, or whole-grain bread. Fill about one fourth of your plate with whole grains. Eat or drink low-fat dairy products, such as skim milk or low-fat yogurt. Avoid fatty cuts of meat, processed or cured meats, and poultry with skin. Fill about one fourth of your plate with lean proteins, such as fish, chicken without skin, beans, eggs, or tofu. Avoid pre-made and processed foods. These tend to be higher in sodium, added sugar, and fat. Reduce your daily sodium intake. Most people with hypertension should eat less than 1,500 mg of sodium a day. Do not drink alcohol if: Your health care provider tells you not to drink. You are pregnant, may be pregnant, or are planning to become pregnant. If you drink alcohol: Limit how much you use to: 0-1 drink a day for women. 0-2 drinks a day for men. Be aware of how much alcohol is in your drink. In the [place] one drink equals one 12 oz bottle of beer (355 mL), one 5 oz glass of wine (148 mL), or one 1 oz glass of hard liquor (44 mL). Lifestyle  Work with your health care provider to maintain a healthy body weight or to lose weight. Ask what an ideal weight is for you. Get at least 30 minutes of exercise most days of the week. Activities may include walking, swimming, or biking. Include exercise to strengthen your muscles (resistance exercise), such as Pilates or lifting weights,  as part of your weekly exercise routine. Try to do these types of exercises for 30 minutes at least 3 days a week. Do not use any products that contain nicotine or tobacco, such as cigarettes, e-cigarettes, and chewing tobacco. If you need help quitting, ask your health care provider. Monitor your blood pressure at home as told by your health care provider. Keep all follow-up visits as told by your health care provider. This is important. Medicines Take over-the-counter and prescription medicines only as told by your health care provider. Follow directions carefully. Blood pressure medicines must be taken as prescribed. Do not skip doses of blood pressure medicine. Doing this puts you at risk for problems and can make the medicine less effective. Ask your health care provider about side effects or reactions to medicines that you should watch for. Contact a health care provider if you: Think you are having a reaction to a medicine you are taking. Have headaches that keep coming back (recurring). Feel dizzy. Have swelling in your ankles. Have trouble with your vision. Get help right away if you: Develop a severe headache or confusion. Have unusual weakness or numbness. Feel faint. Have severe pain in your chest or abdomen. Vomit  repeatedly. Have trouble breathing. Summary Hypertension is when the force of blood pumping through your arteries is too strong. If this condition is not controlled, it may put you at risk for serious complications. Your personal target blood pressure may vary depending on your medical conditions, your age, and other factors. For most people, a normal blood pressure is less than 120/80. Hypertension is treated with lifestyle changes, medicines, or a combination of both. Lifestyle changes include losing weight, eating a healthy, low-sodium diet, exercising more, and limiting alcohol. This information is not intended to replace advice given to you by your health care  provider. Make sure you discuss any questions you have with your health care provider. Document Revised: 09/10/2017 Document Reviewed: 09/10/2017 Elsevier Patient Education  2022 [employer].

## 2020-12-12 NOTE — Progress Notes (Signed)
I,Tianna Badgett,acting as a Education administrator for Maximino Greenland, MD.,have documented all relevant documentation on the behalf of Maximino Greenland, MD,as directed by  Maximino Greenland, MD while in the presence of Maximino Greenland, MD.  This visit occurred during the SARS-CoV-2 public health emergency.  Safety protocols were in place, including screening questions prior to the visit, additional usage of staff PPE, and extensive cleaning of exam room while observing appropriate contact time as indicated for disinfecting solutions.  Subjective:     Patient ID: Mary [name] , female    DOB: October 19, 1945 , 75 y.o.   MRN: [id number]   Chief Complaint  Patient presents with   Hypertension   Diabetes    HPI  The patient is here today for a diabetes and hypertension follow-up.  She reports taking meds as prescribed. No issues with meds.   Hypertension This is a chronic problem. The current episode started more than 1 year ago. Pertinent negatives include no blurred vision, headaches, orthopnea or palpitations. Risk factors for coronary artery disease include diabetes mellitus, dyslipidemia, post-menopausal state and sedentary lifestyle.  Diabetes She presents for her follow-up diabetic visit. She has type 2 diabetes mellitus. Her disease course has been stable. Pertinent negatives for hypoglycemia include no headaches. Pertinent negatives for diabetes include no blurred vision. There are no hypoglycemic complications. Diabetic complications include nephropathy. She is following a diabetic diet.    Past Medical History:  Diagnosis Date   Arthritis    osteoarthritis   Cataract    OS   Diabetes mellitus    Insulin dependent   Diabetic retinopathy (HCC)    NPDR OU   Glaucoma    POAG OD   H/O cardiovascular stress test    pt. reports 10/13- was normal per pt.    Hyperlipemia    Hypertension    Hypertensive retinopathy    OU     Family History  Problem Relation Age of Onset   Hypertension Mother     Heart Problems Father    Gout Father    Arthritis Father      Current Outpatient Medications:    aspirin 81 MG chewable tablet, 1 tablet, Disp: , Rfl:    Assure Comfort Lancets 28G MISC, , Disp: , Rfl:    Blood Glucose Monitoring Suppl (ONETOUCH VERIO FLEX SYSTEM) w/Device KIT, Use as directed to check blood sugars 2 times per day dx: e11.65, Disp: 1 kit, Rfl: 1   brimonidine (ALPHAGAN) 0.2 % ophthalmic solution, Place 1 drop into both eyes 2 (two) times daily., Disp: 5 mL, Rfl: 5   cetirizine (ZYRTEC) 10 MG tablet, TAKE 1 TABLET BY MOUTH EVERY DAY, Disp: 90 tablet, Rfl: 1   fluticasone (FLONASE) 50 MCG/ACT nasal spray, SPRAY 1 SPRAY INTO EACH NOSTRIL EVERY DAY, Disp: 32 mL, Rfl: 1   furosemide (LASIX) 40 MG tablet, TAKE 1 TABLET BY MOUTH  DAILY, Disp: 90 tablet, Rfl: 3   glucose blood (ONETOUCH VERIO) test strip, Use as directed to check blood sugars 2 times per day dx: e11.65, Disp: 100 each, Rfl: 3   HYDROcodone-acetaminophen (NORCO) 10-325 MG tablet, Take 1 tablet by mouth 5 (five) times daily as needed., Disp: , Rfl:    levothyroxine (SYNTHROID) 125 MCG tablet, Take 125 mcg by mouth daily before breakfast., Disp: , Rfl:    metoprolol succinate (TOPROL-XL) 50 MG 24 hr tablet, TAKE 1 TABLET BY MOUTH  DAILY, Disp: 90 tablet, Rfl: 3   omeprazole (PRILOSEC) 20 MG capsule, TAKE  1 CAPSULE BY MOUTH  DAILY, Disp: 90 capsule, Rfl: 3   OneTouch Delica Lancets 21J MISC, Use as directed to check blood sugars 2 times per day dx: e11.65, Disp: 100 each, Rfl: 3   pravastatin (PRAVACHOL) 40 MG tablet, TAKE 1 TABLET BY MOUTH EVERY DAY, Disp: 90 tablet, Rfl: 1   telmisartan (MICARDIS) 80 MG tablet, TAKE 1 TABLET BY MOUTH  DAILY, Disp: 90 tablet, Rfl: 3   timolol (BETIMOL) 0.25 % ophthalmic solution, Place 1 drop into the right eye 2 (two) times daily., Disp: , Rfl:    triamcinolone (KENALOG) 0.1 %, APPLY TO AFFECTED AREA TWICE A DAY, Disp: 30 g, Rfl: 0   No Known Allergies   Review of Systems   Constitutional: Negative.   Eyes:  Negative for blurred vision.  Respiratory: Negative.    Cardiovascular: Negative.  Negative for palpitations and orthopnea.  Gastrointestinal:  Positive for diarrhea.  Neurological:  Negative for headaches.  Psychiatric/Behavioral: Negative.      Today's Vitals   12/12/20 1122  BP: 134/76  Pulse: 69  Temp: 98.1 F (36.7 C)  TempSrc: Oral  Height: 5' 7"  (1.702 m)   Body mass index is 46.99 kg/m.   Objective:  Physical Exam Vitals and nursing note reviewed.  Constitutional:      Appearance: Normal appearance. She is obese.  HENT:     Head: Normocephalic and atraumatic.     Nose:     Comments: Masked     Mouth/Throat:     Comments: Masked  Cardiovascular:     Rate and Rhythm: Normal rate and regular rhythm.     Heart sounds: Normal heart sounds.  Pulmonary:     Effort: Pulmonary effort is normal.     Breath sounds: Normal breath sounds.  Musculoskeletal:     Cervical back: Normal range of motion.     Right lower leg: Edema present.     Left lower leg: Edema present.     Right foot: Decreased range of motion.     Left foot: Decreased range of motion.  Feet:     Right foot:     Protective Sensation: 5 sites tested.  3 sites sensed.     Skin integrity: Callus and dry skin present.     Toenail Condition: Right toenails are abnormally thick.     Left foot:     Protective Sensation: 5 sites tested.  3 sites sensed.     Skin integrity: Callus and dry skin present.     Toenail Condition: Left toenails are abnormally thick.  Skin:    General: Skin is warm.     Comments: Wound left foot, bandage in place  Neurological:     General: No focal deficit present.     Mental Status: She is alert.  Psychiatric:        Mood and Affect: Mood normal.        Behavior: Behavior normal.        Assessment And Plan:     1. Parenchymal renal hypertension, stage 1 through stage 4 or unspecified chronic kidney disease Comments: Chronic, fair  control. Goal BP <13080. No med changes today. Encouraged to follow sodium diet.  She will f/u in 4 months for re-evaluation.  - Hemoglobin A1c - CMP14+EGFR  2. Type 2 diabetes mellitus with stage 3a chronic kidney disease, without long-term current use of insulin (HCC) Comments: Chronic, she is also followed by Dr. Chalmers Cater. She states last visit was March 2022. She is  no longer on any meds, diet controlled.  - Ambulatory referral to Podiatry  3. Primary hypothyroidism Comments: I will check thyroid panel and adjust meds as needed. I will also forward lab results to Dr. Chalmers Cater for her review.  - TSH + free T4  4. Diarrhea, unspecified type Comments: Persistent despite use of Imodium.  STOP magnesium. I will check thyroid panel today and adjust accordingly. She will let me know if her sx persist.   5. Callus Comments: I will refer her to Podiatry for further evaluation.   6. Need for influenza vaccination Comments: She was given high dose flu vaccine - Flu Vaccine QUAD High Dose(Fluad)   Patient was given opportunity to ask questions. Patient verbalized understanding of the plan and was able to repeat key elements of the plan. All questions were answered to their satisfaction.   I, Maximino Greenland, MD, have reviewed all documentation for this visit. The documentation on 12/18/20 for the exam, diagnosis, procedures, and orders are all accurate and complete.   IF YOU HAVE BEEN REFERRED TO A SPECIALIST, IT MAY TAKE 1-2 WEEKS TO SCHEDULE/PROCESS THE REFERRAL. IF YOU HAVE NOT HEARD FROM US/SPECIALIST IN TWO WEEKS, PLEASE GIVE [place] A CALL AT [phone].   THE PATIENT IS ENCOURAGED TO PRACTICE SOCIAL DISTANCING DUE TO THE COVID-19 PANDEMIC.

## 2020-12-13 LAB — CMP14+EGFR
ALT: 6 IU/L (ref 0–32)
AST: 11 IU/L (ref 0–40)
Albumin/Globulin Ratio: 1.2 (ref 1.2–2.2)
Albumin: 4.1 g/dL (ref 3.7–4.7)
Alkaline Phosphatase: 112 IU/L (ref 44–121)
BUN/Creatinine Ratio: 14 (ref 12–28)
BUN: 15 mg/dL (ref 8–27)
Bilirubin Total: 0.6 mg/dL (ref 0.0–1.2)
CO2: 23 mmol/L (ref 20–29)
Calcium: 9.9 mg/dL (ref 8.7–10.3)
Chloride: 103 mmol/L (ref 96–106)
Creatinine, Ser: 1.05 mg/dL — ABNORMAL HIGH (ref 0.57–1.00)
Globulin, Total: 3.4 g/dL (ref 1.5–4.5)
Glucose: 105 mg/dL — ABNORMAL HIGH (ref 70–99)
Potassium: 4.8 mmol/L (ref 3.5–5.2)
Sodium: 141 mmol/L (ref 134–144)
Total Protein: 7.5 g/dL (ref 6.0–8.5)
eGFR: 56 mL/min/{1.73_m2} — ABNORMAL LOW (ref >59)

## 2020-12-13 LAB — TSH+FREE T4
Free T4: 1.8 ng/dL — ABNORMAL HIGH (ref 0.82–1.77)
TSH: 1.08 u[IU]/mL (ref 0.450–4.500)

## 2020-12-13 LAB — HEMOGLOBIN A1C
Est. average glucose Bld gHb Est-mCnc: 103 mg/dL
Hgb A1c MFr Bld: 5.2 % (ref 4.8–5.6)

## 2020-12-14 ENCOUNTER — Ambulatory Visit (INDEPENDENT_AMBULATORY_CARE_PROVIDER_SITE_OTHER): Payer: Medicare Other

## 2020-12-14 ENCOUNTER — Telehealth: Payer: Medicare Other

## 2020-12-14 DIAGNOSIS — I129 Hypertensive chronic kidney disease with stage 1 through stage 4 chronic kidney disease, or unspecified chronic kidney disease: Secondary | ICD-10-CM | POA: Diagnosis not present

## 2020-12-14 DIAGNOSIS — N183 Chronic kidney disease, stage 3 unspecified: Secondary | ICD-10-CM

## 2020-12-14 DIAGNOSIS — R262 Difficulty in walking, not elsewhere classified: Secondary | ICD-10-CM | POA: Diagnosis not present

## 2020-12-14 DIAGNOSIS — Z9181 History of falling: Secondary | ICD-10-CM | POA: Diagnosis not present

## 2020-12-14 DIAGNOSIS — M170 Bilateral primary osteoarthritis of knee: Secondary | ICD-10-CM | POA: Diagnosis not present

## 2020-12-14 DIAGNOSIS — L89892 Pressure ulcer of other site, stage 2: Secondary | ICD-10-CM | POA: Diagnosis not present

## 2020-12-14 DIAGNOSIS — I890 Lymphedema, not elsewhere classified: Secondary | ICD-10-CM | POA: Diagnosis not present

## 2020-12-14 DIAGNOSIS — E039 Hypothyroidism, unspecified: Secondary | ICD-10-CM

## 2020-12-14 DIAGNOSIS — N1831 Chronic kidney disease, stage 3a: Secondary | ICD-10-CM

## 2020-12-14 DIAGNOSIS — E1122 Type 2 diabetes mellitus with diabetic chronic kidney disease: Secondary | ICD-10-CM | POA: Diagnosis not present

## 2020-12-14 NOTE — Chronic Care Management (AMB) (Signed)
Chronic Care Management   CCM RN Visit Note  12/14/2020 Name: Mary [name] MRN: [id number] DOB: [birth year]  Subjective: Mary [name] is a 75 y.o. year old female who is a primary care patient of Glendale Chard, MD. The care management team was consulted for assistance with disease management and care coordination needs.    Engaged with patient by telephone for follow up visit in response to provider referral for case management and/or care coordination services.   Consent to Services:  The patient was given information about Chronic Care Management services, agreed to services, and gave verbal consent prior to initiation of services.  Please see initial visit note for detailed documentation.   Patient agreed to services and verbal consent obtained.   Assessment: Review of patient past medical history, allergies, medications, health status, including review of consultants reports, laboratory and other test data, was performed as part of comprehensive evaluation and provision of chronic care management services.   SDOH (Social Determinants of Health) assessments and interventions performed:    CCM Care Plan  No Known Allergies  Outpatient Encounter Medications as of 12/14/2020  Medication Sig   aspirin 81 MG chewable tablet 1 tablet   Assure Comfort Lancets 28G MISC    Blood Glucose Monitoring Suppl (ONETOUCH VERIO FLEX SYSTEM) w/Device KIT Use as directed to check blood sugars 2 times per day dx: e11.65   brimonidine (ALPHAGAN) 0.2 % ophthalmic solution Place 1 drop into both eyes 2 (two) times daily.   cetirizine (ZYRTEC) 10 MG tablet TAKE 1 TABLET BY MOUTH EVERY DAY   fluticasone (FLONASE) 50 MCG/ACT nasal spray SPRAY 1 SPRAY INTO EACH NOSTRIL EVERY DAY   furosemide (LASIX) 40 MG tablet TAKE 1 TABLET BY MOUTH  DAILY   glucose blood (ONETOUCH VERIO) test strip Use as directed to check blood sugars 2 times per day dx: e11.65   HYDROcodone-acetaminophen (NORCO) 10-325 MG tablet  Take 1 tablet by mouth 5 (five) times daily as needed.   levothyroxine (SYNTHROID) 125 MCG tablet Take 125 mcg by mouth daily before breakfast.   metoprolol succinate (TOPROL-XL) 50 MG 24 hr tablet TAKE 1 TABLET BY MOUTH  DAILY   omeprazole (PRILOSEC) 20 MG capsule TAKE 1 CAPSULE BY MOUTH  DAILY   OneTouch Delica Lancets 64R MISC Use as directed to check blood sugars 2 times per day dx: e11.65   pravastatin (PRAVACHOL) 40 MG tablet TAKE 1 TABLET BY MOUTH EVERY DAY   telmisartan (MICARDIS) 80 MG tablet TAKE 1 TABLET BY MOUTH  DAILY   timolol (BETIMOL) 0.25 % ophthalmic solution Place 1 drop into the right eye 2 (two) times daily.   triamcinolone (KENALOG) 0.1 % APPLY TO AFFECTED AREA TWICE A DAY   No facility-administered encounter medications on file as of 12/14/2020.    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) 02/21/2018   Skin ulcer of right foot with fat layer exposed ([place]) 02/18/2018   Morbid (severe) obesity due to excess calories ([place]) 11/18/2017   Knee pain 07/16/2015   Lateral dislocation of right patella 05/04/2015   Rupture of right  quadriceps tendon 05/24/2014   S/P TKR (total knee replacement) 05/05/2012    Conditions to be addressed/monitored: Diabetes Mellitus with stage 3 Chronic Kidney disease, Parenchymal renal hypertension, Class 3 severe obesity, Edema of left lower extremity, Primary Hypothyroidism    Care Plan : [place] of Care  Updates made by Lynne Logan, RN since 12/14/2020 12:00 AM     Problem: No Plan of Care established for management of  chronic disease states (Diabetes Mellitus with stage 3 Chronic Kidney disease, Parenchymal renal hypertension, Class 3 severe obesity, Edema of left lower extremity, Primary Hypothyroidism)   Priority: High     Long-Range Goal: Development of plan of care for chronic disease management (Diabetes Mellitus with stage 3 Chronic Kidney disease, Parenchymal renal hypertension, Class 3 severe obesity, Edema of left lower extremity, Primary Hypothyroidism)   Start Date: 12/14/2020  Expected End Date: 12/14/2021  This Visit's Progress: On track  Priority: High  Note:   Current Barriers:  Knowledge Deficits related to plan of care for management of Diabetes Mellitus with stage 3 Chronic Kidney disease, Parenchymal renal hypertension, Class 3 severe obesity, Edema of left lower extremity, Primary Hypothyroidism    Chronic Disease Management support and education needs related to Diabetes Mellitus with stage 3 Chronic Kidney disease, Parenchymal renal hypertension, Class 3 severe obesity, Edema of left lower extremity, Primary Hypothyroidism    Lacks caregiver support  RNCM Clinical Goal(s):  Patient will verbalize basic understanding of  Diabetes Mellitus with stage 3 Chronic Kidney disease, Parenchymal renal hypertension, Class 3 severe obesity, Edema of left lower extremity, Primary Hypothyroidism   disease process and self health management plan as evidenced by patient will report having no disease exacerbations related to her chronic disease states  take all medications exactly as prescribed and will call provider for medication related questions as evidenced by patient will report having no missed doses of her prescribed medications demonstrate Improved health management independence as evidenced by patient will maintain the ability to perform self care  continue to work with RN Care Manager to address care management and care coordination needs related to  Diabetes Mellitus with stage 3 Chronic Kidney  disease, Parenchymal renal hypertension, Class 3 severe obesity, Edema of left lower extremity, Primary Hypothyroidism   as evidenced by adherence to CM Team Scheduled appointments demonstrate ongoing self health care management ability   as evidenced by    through collaboration with RN Care manager, provider, and care team.   Interventions: 1:1 collaboration with primary care provider regarding development and update of comprehensive plan of care as evidenced by provider attestation and co-signature Inter-disciplinary care team collaboration (see longitudinal plan of care) Evaluation of current treatment plan related to  self management and patient's adherence to plan as established by provider  Diabetes Interventions:  (Status:  Goal on track:  Yes.) Long Term Goal Assessed patient's understanding of A1c goal:  <5.7 Reviewed medications with patient and discussed importance of medication adherence Counseled on importance of regular laboratory monitoring as prescribed Review of patient status, including review of consultants reports, relevant laboratory and other test results, and medications completed Discussed plans with patient for ongoing care management follow up and provided patient with direct contact information for care management team Lab Results  Component Value Date   HGBA1C 5.2 12/12/2020    Chronic Kidney Disease Interventions:  (Status:  New goal.) Long Term Goal Reviewed prescribed diet increase water to 64 oz daily unless otherwise directed Screening for signs and  symptoms of depression related to chronic disease state      Provided education on kidney disease progression    Discussed plans with patient for ongoing care management follow up and provided patient with direct contact information for care management team Last practice recorded BP readings:  BP Readings from Last 3 Encounters:  12/12/20 134/76  07/27/20 126/88  07/23/20 (!) 150/53  Most recent eGFR/CrCl:   Lab Results  Component Value Date   EGFR 56 (L) 12/12/2020    No components found for: CRCL  Patient Goals/Self-Care Activities: Take all medications as prescribed Attend all scheduled provider appointments Call pharmacy for medication refills 3-7 days in advance of running out of medications Perform all self care activities independently  Perform IADL's (shopping, preparing meals, housekeeping, managing finances) independently Call provider office for new concerns or questions  drink 6 to 8 glasses of water each day manage portion size  Follow Up Plan:  Telephone follow up appointment with care management team member scheduled for:  02/15/21      Plan:Telephone follow up appointment with care management team member scheduled for:  02/15/21  Barb Merino, RN, BSN, CCM Care Management Coordinator [facility]  Direct Phone: [phone]

## 2020-12-14 NOTE — Patient Instructions (Signed)
Visit Information   Thank you for taking time to visit with me today. Please don't hesitate to contact me if I can be of assistance to you before our next scheduled telephone appointment.  Following are the goals we discussed today:  (Copy and paste patient goals from clinical care plan here)  Our next appointment is by telephone on 02/15/21 at 11:50 AM  Please call the care guide team at [phone] if you need to cancel or reschedule your appointment.   Following is a copy of your full care plan:   Care Plan : [place] of Care  Updates made by Lynne Logan, RN since 12/14/2020 12:00 AM     Problem: No Plan of Care established for management of chronic disease states (Diabetes Mellitus with stage 3 Chronic Kidney disease, Parenchymal renal hypertension, Class 3 severe obesity, Edema of left lower extremity, Primary Hypothyroidism)   Priority: High     Long-Range Goal: Development of plan of care for chronic disease management (Diabetes Mellitus with stage 3 Chronic Kidney disease, Parenchymal renal hypertension, Class 3 severe obesity, Edema of left lower extremity, Primary Hypothyroidism)   Start Date: 12/14/2020  Expected End Date: 12/14/2021  This Visit's Progress: On track  Priority: High  Note:   Current Barriers:  Knowledge Deficits related to plan of care for management of Diabetes Mellitus with stage 3 Chronic Kidney disease, Parenchymal renal hypertension, Class 3 severe obesity, Edema of left lower extremity, Primary Hypothyroidism    Chronic Disease Management support and education needs related to Diabetes Mellitus with stage 3 Chronic Kidney disease, Parenchymal renal hypertension, Class 3 severe obesity, Edema of left lower extremity, Primary Hypothyroidism    Lacks caregiver support  RNCM Clinical Goal(s):  Patient will verbalize basic understanding of  Diabetes Mellitus with stage 3 Chronic Kidney disease, Parenchymal renal hypertension, Class 3 severe  obesity, Edema of left lower extremity, Primary Hypothyroidism   disease process and self health management plan as evidenced by patient will report having no disease exacerbations related to her chronic disease states  take all medications exactly as prescribed and will call provider for medication related questions as evidenced by patient will report having no missed doses of her prescribed medications demonstrate Improved health management independence as evidenced by patient will maintain the ability to perform self care  continue to work with RN Care Manager to address care management and care coordination needs related to  Diabetes Mellitus with stage 3 Chronic Kidney disease, Parenchymal renal hypertension, Class 3 severe obesity, Edema of left lower extremity, Primary Hypothyroidism   as evidenced by adherence to CM Team Scheduled appointments demonstrate ongoing self health care management ability   as evidenced by    through collaboration with RN Care manager, provider, and care team.   Interventions: 1:1 collaboration with primary care provider regarding development and update of comprehensive plan of care as evidenced by provider attestation and co-signature Inter-disciplinary care team collaboration (see longitudinal plan of care) Evaluation of current treatment plan related to  self management and patient's adherence to plan as established by provider  Diabetes Interventions:  (Status:  Goal on track:  Yes.) Long Term Goal Assessed patient's understanding of A1c goal:  <5.7 Reviewed medications with patient and discussed importance of medication adherence Counseled on importance of regular laboratory monitoring as prescribed Review of patient status, including review of consultants reports, relevant laboratory and other test results, and medications completed Discussed plans with patient for ongoing care management follow up and  provided patient with direct contact information for care  management team Lab Results  Component Value Date   HGBA1C 5.2 12/12/2020    Chronic Kidney Disease Interventions:  (Status:  New goal.) Long Term Goal Reviewed prescribed diet increase water to 64 oz daily unless otherwise directed Screening for signs and symptoms of depression related to chronic disease state      Provided education on kidney disease progression    Discussed plans with patient for ongoing care management follow up and provided patient with direct contact information for care management team Last practice recorded BP readings:  BP Readings from Last 3 Encounters:  12/12/20 134/76  07/27/20 126/88  07/23/20 (!) 150/53  Most recent eGFR/CrCl:  Lab Results  Component Value Date   EGFR 56 (L) 12/12/2020    No components found for: CRCL  Patient Goals/Self-Care Activities: Take all medications as prescribed Attend all scheduled provider appointments Call pharmacy for medication refills 3-7 days in advance of running out of medications Perform all self care activities independently  Perform IADL's (shopping, preparing meals, housekeeping, managing finances) independently Call provider office for new concerns or questions  drink 6 to 8 glasses of water each day manage portion size  Follow Up Plan:  Telephone follow up appointment with care management team member scheduled for:  02/15/21      Consent to CCM Services: Mary [name] was given information about Chronic Care Management services including:  CCM service includes personalized support from designated clinical staff supervised by her physician, including individualized plan of care and coordination with other care providers 24/7 contact phone numbers for assistance for urgent and routine care needs. Service will only be billed when office clinical staff spend 20 minutes or more in a month to coordinate care. Only one practitioner may furnish and bill the service in a calendar month. The patient may stop CCM  services at any time (effective at the end of the month) by phone call to the office staff. The patient will be responsible for cost sharing (co-pay) of up to 20% of the service fee (after annual deductible is met).  Patient agreed to services and verbal consent obtained.   Patient verbalizes understanding of instructions provided today and agrees to view in [place].   Telephone follow up appointment with care management team member scheduled for: 02/15/21

## 2020-12-19 DIAGNOSIS — Z9181 History of falling: Secondary | ICD-10-CM | POA: Diagnosis not present

## 2020-12-19 DIAGNOSIS — I129 Hypertensive chronic kidney disease with stage 1 through stage 4 chronic kidney disease, or unspecified chronic kidney disease: Secondary | ICD-10-CM | POA: Diagnosis not present

## 2020-12-19 DIAGNOSIS — I890 Lymphedema, not elsewhere classified: Secondary | ICD-10-CM | POA: Diagnosis not present

## 2020-12-19 DIAGNOSIS — N183 Chronic kidney disease, stage 3 unspecified: Secondary | ICD-10-CM | POA: Diagnosis not present

## 2020-12-19 DIAGNOSIS — R262 Difficulty in walking, not elsewhere classified: Secondary | ICD-10-CM | POA: Diagnosis not present

## 2020-12-19 DIAGNOSIS — M170 Bilateral primary osteoarthritis of knee: Secondary | ICD-10-CM | POA: Diagnosis not present

## 2020-12-19 DIAGNOSIS — L89892 Pressure ulcer of other site, stage 2: Secondary | ICD-10-CM | POA: Diagnosis not present

## 2020-12-19 DIAGNOSIS — E1122 Type 2 diabetes mellitus with diabetic chronic kidney disease: Secondary | ICD-10-CM | POA: Diagnosis not present

## 2020-12-22 DIAGNOSIS — N183 Chronic kidney disease, stage 3 unspecified: Secondary | ICD-10-CM | POA: Diagnosis not present

## 2020-12-22 DIAGNOSIS — R262 Difficulty in walking, not elsewhere classified: Secondary | ICD-10-CM | POA: Diagnosis not present

## 2020-12-22 DIAGNOSIS — E1122 Type 2 diabetes mellitus with diabetic chronic kidney disease: Secondary | ICD-10-CM | POA: Diagnosis not present

## 2020-12-22 DIAGNOSIS — L89892 Pressure ulcer of other site, stage 2: Secondary | ICD-10-CM | POA: Diagnosis not present

## 2020-12-22 DIAGNOSIS — Z9181 History of falling: Secondary | ICD-10-CM | POA: Diagnosis not present

## 2020-12-22 DIAGNOSIS — I890 Lymphedema, not elsewhere classified: Secondary | ICD-10-CM | POA: Diagnosis not present

## 2020-12-22 DIAGNOSIS — M170 Bilateral primary osteoarthritis of knee: Secondary | ICD-10-CM | POA: Diagnosis not present

## 2020-12-22 DIAGNOSIS — I129 Hypertensive chronic kidney disease with stage 1 through stage 4 chronic kidney disease, or unspecified chronic kidney disease: Secondary | ICD-10-CM | POA: Diagnosis not present

## 2020-12-23 DIAGNOSIS — E1122 Type 2 diabetes mellitus with diabetic chronic kidney disease: Secondary | ICD-10-CM | POA: Diagnosis not present

## 2020-12-23 DIAGNOSIS — M170 Bilateral primary osteoarthritis of knee: Secondary | ICD-10-CM | POA: Diagnosis not present

## 2020-12-23 DIAGNOSIS — Z96659 Presence of unspecified artificial knee joint: Secondary | ICD-10-CM | POA: Diagnosis not present

## 2020-12-23 DIAGNOSIS — I129 Hypertensive chronic kidney disease with stage 1 through stage 4 chronic kidney disease, or unspecified chronic kidney disease: Secondary | ICD-10-CM | POA: Diagnosis not present

## 2020-12-25 NOTE — Progress Notes (Addendum)
[facility] Note  12/29/2020     CHIEF COMPLAINT Patient presents for Retina Follow Up    HISTORY OF PRESENT ILLNESS: Mary [name] is a 75 y.o. female who presents to the clinic today for:   HPI     Retina Follow Up   Patient presents with  CRVO/BRVO.  In both eyes.  Severity is severe.  Duration of 7 weeks.  Since onset it is stable.  I, the attending physician,  performed the HPI with the patient and updated documentation appropriately.        Comments   7 week retina follow up for Crvo ou/NPDR ou. Patient states vision is about the same. Patient blood sugar 110 and A1C 5.2      Last edited by Bernarda Caffey, MD on 12/29/2020 12:02 PM.     Pt states vision is stable  Referring physician: Glendale Chard, MD [address] [place],  [place] [address]  HISTORICAL INFORMATION:   Selected notes from the [facility] Transferred care from Dr. Zigmund Daniel   CURRENT MEDICATIONS: Current Outpatient Medications (Ophthalmic Drugs)  Medication Sig   brimonidine (ALPHAGAN) 0.2 % ophthalmic solution Place 1 drop into both eyes 2 (two) times daily.   timolol (BETIMOL) 0.25 % ophthalmic solution Place 1 drop into the right eye 2 (two) times daily.   No current facility-administered medications for this visit. (Ophthalmic Drugs)   Current Outpatient Medications (Other)  Medication Sig   aspirin 81 MG chewable tablet 1 tablet   Assure Comfort Lancets 28G MISC    Blood Glucose Monitoring Suppl (ONETOUCH VERIO FLEX SYSTEM) w/Device KIT Use as directed to check blood sugars 2 times per day dx: e11.65   cetirizine (ZYRTEC) 10 MG tablet TAKE 1 TABLET BY MOUTH EVERY DAY   fluticasone (FLONASE) 50 MCG/ACT nasal spray SPRAY 1 SPRAY INTO EACH NOSTRIL EVERY DAY   furosemide (LASIX) 40 MG tablet TAKE 1 TABLET BY MOUTH  DAILY   glucose blood (ONETOUCH VERIO) test strip Use as directed to check blood sugars 2 times per day dx: e11.65    HYDROcodone-acetaminophen (NORCO) 10-325 MG tablet Take 1 tablet by mouth 5 (five) times daily as needed.   levothyroxine (SYNTHROID) 125 MCG tablet Take 125 mcg by mouth daily before breakfast.   metoprolol succinate (TOPROL-XL) 50 MG 24 hr tablet TAKE 1 TABLET BY MOUTH  DAILY   omeprazole (PRILOSEC) 20 MG capsule TAKE 1 CAPSULE BY MOUTH  DAILY   OneTouch Delica Lancets 24Q MISC Use as directed to check blood sugars 2 times per day dx: e11.65   pravastatin (PRAVACHOL) 40 MG tablet TAKE 1 TABLET BY MOUTH EVERY DAY   telmisartan (MICARDIS) 80 MG tablet TAKE 1 TABLET BY MOUTH  DAILY   triamcinolone (KENALOG) 0.1 % APPLY TO AFFECTED AREA TWICE A DAY   No current facility-administered medications for this visit. (Other)   REVIEW OF SYSTEMS: ROS   Positive for: Genitourinary, Musculoskeletal, Endocrine, Eyes Negative for: Constitutional, Gastrointestinal, Neurological, Skin, HENT, Cardiovascular, Respiratory, Psychiatric, Allergic/Imm, Heme/Lymph Last edited by Elmore Guise, COT on 12/29/2020  8:48 AM.     ALLERGIES No Known Allergies  PAST MEDICAL HISTORY Past Medical History:  Diagnosis Date   Arthritis    osteoarthritis   Cataract    OS   Diabetes mellitus    Insulin dependent   Diabetic retinopathy ([place])    NPDR OU   Glaucoma    POAG OD   H/O cardiovascular stress  test    pt. reports 10/13- was normal per pt.    Hyperlipemia    Hypertension    Hypertensive retinopathy    OU   Past Surgical History:  Procedure Laterality Date   CARPAL TUNNEL RELEASE Bilateral    CATARACT EXTRACTION Right 10/05/2018   Dr. Kathlen Mody   EYE SURGERY Right    Cat Sx   KNEE ARTHROSCOPY     right   QUADRICEPS TENDON REPAIR  02/17/2012   Procedure: REPAIR QUADRICEP TENDON;  Surgeon: Vickey Huger, MD;  Location: [facility];  Service: Orthopedics;  Laterality: Right;  right quadriceps repair with latera release   QUADRICEPS TENDON REPAIR Right 05/15/2012   Procedure: REPAIR QUADRICEP TENDON;   Surgeon: Vickey Huger, MD;  Location: [facility];  Service: Orthopedics;  Laterality: Right;   TOTAL KNEE ARTHROPLASTY  10/21/2011   Procedure: TOTAL KNEE ARTHROPLASTY;  Surgeon: Rudean Haskell, MD;  Location: [facility];  Service: Orthopedics;  Laterality: Right;   TUBAL LIGATION      FAMILY HISTORY Family History  Problem Relation Age of Onset   Hypertension Mother    Heart Problems Father    Gout Father    Arthritis Father     SOCIAL HISTORY Social History   Tobacco Use   Smoking status: Former    Packs/day: 1.00    Years: 30.00    Pack years: 30.00    Types: Cigarettes    Start date: 01/15/1967    Quit date: 06/28/2014    Years since quitting: 6.5   Smokeless tobacco: Never  Vaping Use   Vaping Use: Never used  Substance Use Topics   Alcohol use: No   Drug use: Yes    Types: Hydrocodone       OPHTHALMIC EXAM: Base Eye Exam     Visual Acuity (Snellen - Linear)       Right Left   Dist [place] 20/80-1 20/HM   Dist ph [place] 20/70-3          Tonometry (Tonopen, 8:51 AM)       Right Left   Pressure 12 14         Pupils       Dark Light Shape React APD   Right 3 2 Round Minimal None   Left 2 2 Round NR None         Visual Fields       Left Right     Full   Restrictions Total superior temporal, inferior temporal, superior nasal, inferior nasal deficiencies          Extraocular Movement       Right Left    Full Full         Neuro/Psych     Oriented x3: Yes   Mood/Affect: Normal         Dilation     Both eyes: 1.0% Mydriacyl, 2.5% Phenylephrine @ 8:51 AM           Slit Lamp and Fundus Exam     Slit Lamp Exam       Right Left   Lids/Lashes Dermatochalasis - upper lid, mild Meibomian gland dysfunction Dermatochalasis - upper lid, mild Meibomian gland dysfunction   Conjunctiva/Sclera Melanosis Melanosis   Cornea 2-3+ Punctate epithelial erosions, well healed temporal cataract wounds 3+ Punctate epithelial erosions, well healed temporal  cataract wounds   Anterior Chamber Deep and quiet Deep and quiet   Iris Round and moderately dilated to 5.63m Round and moderately dilated to 677m  Lens PC IOL in good position, trace, cortical remnant temporally, PC folds 2-3+ Nuclear sclerosis, 2-3+ Cortical cataract   Anterior Vitreous Vitreous syneresis, vitreous condensations, Ozurdex pellet remnants inferiorly, no residual IVT Vitreous syneresis         Fundus Exam       Right Left   Disc 2-3+Pallor, Sharp rim, temporal PPP/PPA, thin superior rim Mild Pallor, Sharp rim, PPP, severely attenuated vessels   C/D Ratio 0.7 0.3   Macula Blunted foveal reflex, [place], Retinal pigment epithelial mottling, temporal cystic changes with exudates -- improved Flat, Blunted foveal reflex, RPE mottling and clumping, scarring, atrophy, no heme   Vessels attenuated, Tortuous Severe Vascular attenuation, +fibrosis   Periphery Attached, focal IRH temporal periphery--improved Attached, pavingstone and reticular degeneration inferiorly, scattered RPE atrophy, greatest peripapillary              IMAGING AND PROCEDURES  Imaging and Procedures for @TODAY @  OCT, Retina - OU - Both Eyes       Right Eye Quality was good. Central Foveal Thickness: 183. Progression has improved. Findings include abnormal foveal contour, intraretinal fluid, intraretinal hyper-reflective material, no SRF, vitreomacular adhesion (Mild Interval improvement in temporal cystic changes / IRF, +vitreous opacities).   Left Eye Quality was good. Central Foveal Thickness: 239. Progression has been stable. Findings include abnormal foveal contour, no IRF, no SRF, outer retinal atrophy, epiretinal membrane, preretinal fibrosis, subretinal hyper-reflective material, pigment epithelial detachment (Stable from prior).   Notes *Images captured and stored on drive  Diagnosis / Impression:  OD: Mild Interval improvement in temporal cystic changes / IRF, +vitreous opacities OS:  diffuse ORA - Stable from prior  Clinical management:  See below  Abbreviations: NFP - Normal foveal profile. CME - cystoid macular edema. PED - pigment epithelial detachment. IRF - intraretinal fluid. SRF - subretinal fluid. EZ - ellipsoid zone. ERM - epiretinal membrane. ORA - outer retinal atrophy. ORT - outer retinal tubulation. SRHM - subretinal hyper-reflective material      Intravitreal Injection, Pharmacologic Agent - OD - Right Eye       Time Out 12/29/2020. 9:16 AM. Confirmed correct patient, procedure, site, and patient consented.   Anesthesia Topical anesthesia was used. Anesthetic medications included Lidocaine 2%, Proparacaine 0.5%.   Procedure Preparation included 5% betadine to ocular surface, eyelid speculum. A 27 gauge needle was used.   Injection: 4 mg triamcinolone acetonide 40 MG/ML   Route: Intravitreal, Site: Right Eye   NDC: [phone], Lot: [id number], Expiration date: 07/14/2022   Post-op Post injection exam found visual acuity of at least counting fingers. The patient tolerated the procedure well. There were no complications. The patient received written and verbal post procedure care education. Post injection medications were not given.   Notes An AC tap was performed following injection due to elevated IOP using a 30 gauge needle on a syringe with the plunger removed. The needle was placed at the limbus at 7 oclock and approximately 0.04 cc of aqueous was removed from the anterior chamber. Betadine was applied to the tap area before and after the paracentesis was performed. There were no complications. The patient tolerated the procedure well. The IOP was rechecked and was found to be ~10 mmHg by tonopen.            ASSESSMENT/PLAN:    ICD-10-CM   1. Central retinal vein occlusion with macular edema of both eyes  H34.8130 OCT, Retina - OU - Both Eyes    Intravitreal Injection, Pharmacologic Agent - OD -  Right Eye    triamcinolone acetonide  (KENALOG-40) injection 4 mg    2. Severe nonproliferative diabetic retinopathy of both eyes with macular edema associated with type 2 diabetes mellitus ([place])  Z61.0960     3. Essential hypertension  I10     4. Hypertensive retinopathy of both eyes  H35.033     5. 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 Pseudophakia  Z96.1     7. Primary open angle glaucoma (POAG) of right eye, mild stage  H40.1111       1. CRVO w/ CME OU  - pt of Dr. Zigmund Daniel who wished to transfer care  - OS long-standing low vision (CF) with subfoveal scar and central retinal atrophy  - OD with CME actively being managed with Ozurdex OD ~q5 wks by Dr. Zigmund Daniel (last on 8.20.20) s/p multiple IVTA and Ozurdex OD  - was due for Ozurdex OD w/ Zigmund Daniel on 9.25.20, but underwent cataract surgery OD w/ Kathlen Mody on 9.21.20  - s/p IVTA OD #4 (10.26.20), #5 (12.10.20), #6 (01.21.21), #7 (03.05.21), #8 (04.16.21), #9 (05.28.21), #10 (07.09.21), #11 (09.14.21), #12 (10.29.21), #13 (12.17.21), #14 (01.27.22), #15 (03.11.2022), #16 (05.04.22), #17 (06.17.22), #18 (07.29.22), #19 (09.16.22), #20 (10.28.22)  - BCVA stable at 20/70  - exam shows no residual IVTA in vitreous cavity  - OCT OD today shows interval improvement in temporal IRF/IRHM at 7 wks -- almost resolved; OS - stable from prior  - recommend IVTA OD #21 today, 12.16.22 w/ f/u at 7 wks  - pt wishes to proceed  - RBA of procedure discussed, questions answered  - informed consent obtained  - see procedure note -- AC tap  - f/u in 7 wks -- DFE/OCT/possible injection + possible ext to 8 wks  2. Severe Non-proliferative diabetic retinopathy OU.  - OCT shows diabetic macular edema, OD   - recommend IVTA OD today, 12.16.22, as above  3,4. Hypertensive retinopathy OU  - discussed importance of tight BP control  - monitor    5. Mixed form age related cataract OS  - under the expert management of Dr. Kathlen Mody   6. Pseudophakia OD  - s/p CE/IOL  OD (9.21.20) by expert surgeon, Dr. Kathlen Mody  - IOL in excellent position  - had post op IOP spike -- IOP now under better control  - monitor    7. POAG OD  - was under the expert management of Dr. Kathlen Mody  - s/p SLT and goniotomy OD  - had post-cataract IOP spike  - on brimonidine BID OU  - IOP 12 today  - AC taps post IVTA injection  Ophthalmic Meds Ordered this visit:  Meds ordered this encounter  Medications   triamcinolone acetonide (KENALOG-40) injection 4 mg     Return in about 7 weeks (around 02/16/2021) for f/u CRVO OU, DFE, OCT.  There are no Patient Instructions on file for this visit.   This document serves as a record of services personally performed by Gardiner Sleeper, MD, PhD. It was created on their behalf by Leonie Douglas, an ophthalmic technician. The creation of this record is the provider's dictation and/or activities during the visit.    Electronically signed by: Leonie Douglas COA, 12/29/20  12:14 PM  Gardiner Sleeper, M.D., Ph.D. Diseases & Surgery of the Retina and [place] 12/29/2020   I have reviewed the above documentation for accuracy and completeness, and I agree with the above.  Gardiner Sleeper, M.D., Ph.D. 12/29/20 12:16 PM  Abbreviations: M myopia (nearsighted); A astigmatism; H hyperopia (farsighted); P presbyopia; Mrx spectacle prescription;  CTL contact lenses; OD right eye; OS left eye; OU both eyes  XT exotropia; ET esotropia; PEK punctate epithelial keratitis; PEE punctate epithelial erosions; DES dry eye syndrome; MGD meibomian gland dysfunction; ATs artificial tears; PFAT's preservative free artificial tears; [place] nuclear sclerotic cataract; PSC posterior subcapsular cataract; ERM epi-retinal membrane; PVD posterior vitreous detachment; RD retinal detachment; DM diabetes mellitus; DR diabetic retinopathy; NPDR non-proliferative diabetic retinopathy; PDR proliferative diabetic retinopathy; CSME clinically significant  macular edema; DME diabetic macular edema; dbh dot blot hemorrhages; CWS cotton wool spot; POAG primary open angle glaucoma; C/D cup-to-disc ratio; HVF humphrey visual field; GVF goldmann visual field; OCT optical coherence tomography; IOP intraocular pressure; BRVO Branch retinal vein occlusion; CRVO central retinal vein occlusion; CRAO central retinal artery occlusion; BRAO branch retinal artery occlusion; RT retinal tear; SB scleral buckle; PPV pars plana vitrectomy; VH Vitreous hemorrhage; PRP panretinal laser photocoagulation; IVK intravitreal kenalog; VMT vitreomacular traction; MH Macular hole;  NVD neovascularization of the disc; NVE neovascularization elsewhere; AREDS age related eye disease study; ARMD age related macular degeneration; POAG primary open angle glaucoma; EBMD epithelial/anterior basement membrane dystrophy; ACIOL anterior chamber intraocular lens; IOL intraocular lens; PCIOL posterior chamber intraocular lens; Phaco/IOL phacoemulsification with intraocular lens placement; [place] photorefractive keratectomy; LASIK laser assisted in situ keratomileusis; HTN hypertension; DM diabetes mellitus; COPD chronic obstructive pulmonary disease

## 2020-12-26 DIAGNOSIS — E1122 Type 2 diabetes mellitus with diabetic chronic kidney disease: Secondary | ICD-10-CM | POA: Diagnosis not present

## 2020-12-26 DIAGNOSIS — I890 Lymphedema, not elsewhere classified: Secondary | ICD-10-CM | POA: Diagnosis not present

## 2020-12-26 DIAGNOSIS — L89892 Pressure ulcer of other site, stage 2: Secondary | ICD-10-CM | POA: Diagnosis not present

## 2020-12-26 DIAGNOSIS — I129 Hypertensive chronic kidney disease with stage 1 through stage 4 chronic kidney disease, or unspecified chronic kidney disease: Secondary | ICD-10-CM | POA: Diagnosis not present

## 2020-12-26 DIAGNOSIS — N183 Chronic kidney disease, stage 3 unspecified: Secondary | ICD-10-CM | POA: Diagnosis not present

## 2020-12-26 DIAGNOSIS — R262 Difficulty in walking, not elsewhere classified: Secondary | ICD-10-CM | POA: Diagnosis not present

## 2020-12-26 DIAGNOSIS — Z9181 History of falling: Secondary | ICD-10-CM | POA: Diagnosis not present

## 2020-12-26 DIAGNOSIS — M170 Bilateral primary osteoarthritis of knee: Secondary | ICD-10-CM | POA: Diagnosis not present

## 2020-12-29 ENCOUNTER — Encounter (INDEPENDENT_AMBULATORY_CARE_PROVIDER_SITE_OTHER): Payer: Self-pay | Admitting: Ophthalmology

## 2020-12-29 ENCOUNTER — Other Ambulatory Visit: Payer: Self-pay

## 2020-12-29 ENCOUNTER — Ambulatory Visit (INDEPENDENT_AMBULATORY_CARE_PROVIDER_SITE_OTHER): Payer: Medicare Other | Admitting: Ophthalmology

## 2020-12-29 DIAGNOSIS — Z961 Presence of intraocular lens: Secondary | ICD-10-CM

## 2020-12-29 DIAGNOSIS — H34813 Central retinal vein occlusion, bilateral, with macular edema: Secondary | ICD-10-CM | POA: Diagnosis not present

## 2020-12-29 DIAGNOSIS — E113413 Type 2 diabetes mellitus with severe nonproliferative diabetic retinopathy with macular edema, bilateral: Secondary | ICD-10-CM

## 2020-12-29 DIAGNOSIS — I10 Essential (primary) hypertension: Secondary | ICD-10-CM | POA: Diagnosis not present

## 2020-12-29 DIAGNOSIS — H25812 Combined forms of age-related cataract, left eye: Secondary | ICD-10-CM

## 2020-12-29 DIAGNOSIS — H35033 Hypertensive retinopathy, bilateral: Secondary | ICD-10-CM | POA: Diagnosis not present

## 2020-12-29 DIAGNOSIS — H3581 Retinal edema: Secondary | ICD-10-CM

## 2020-12-29 DIAGNOSIS — H401111 Primary open-angle glaucoma, right eye, mild stage: Secondary | ICD-10-CM | POA: Diagnosis not present

## 2020-12-29 MED ORDER — TRIAMCINOLONE ACETONIDE 40 MG/ML IJ SUSP FOR KALEIDOSCOPE
4.0000 mg | INTRAMUSCULAR | Status: AC | PRN
  Administered 2020-12-29: 4 mg via INTRAVITREAL

## 2021-01-01 DIAGNOSIS — I890 Lymphedema, not elsewhere classified: Secondary | ICD-10-CM | POA: Diagnosis not present

## 2021-01-01 DIAGNOSIS — M170 Bilateral primary osteoarthritis of knee: Secondary | ICD-10-CM | POA: Diagnosis not present

## 2021-01-01 DIAGNOSIS — L89892 Pressure ulcer of other site, stage 2: Secondary | ICD-10-CM | POA: Diagnosis not present

## 2021-01-01 DIAGNOSIS — E1122 Type 2 diabetes mellitus with diabetic chronic kidney disease: Secondary | ICD-10-CM | POA: Diagnosis not present

## 2021-01-01 DIAGNOSIS — R262 Difficulty in walking, not elsewhere classified: Secondary | ICD-10-CM | POA: Diagnosis not present

## 2021-01-01 DIAGNOSIS — Z9181 History of falling: Secondary | ICD-10-CM | POA: Diagnosis not present

## 2021-01-01 DIAGNOSIS — N183 Chronic kidney disease, stage 3 unspecified: Secondary | ICD-10-CM | POA: Diagnosis not present

## 2021-01-01 DIAGNOSIS — I129 Hypertensive chronic kidney disease with stage 1 through stage 4 chronic kidney disease, or unspecified chronic kidney disease: Secondary | ICD-10-CM | POA: Diagnosis not present

## 2021-01-10 DIAGNOSIS — I129 Hypertensive chronic kidney disease with stage 1 through stage 4 chronic kidney disease, or unspecified chronic kidney disease: Secondary | ICD-10-CM | POA: Diagnosis not present

## 2021-01-10 DIAGNOSIS — L89892 Pressure ulcer of other site, stage 2: Secondary | ICD-10-CM | POA: Diagnosis not present

## 2021-01-10 DIAGNOSIS — M170 Bilateral primary osteoarthritis of knee: Secondary | ICD-10-CM | POA: Diagnosis not present

## 2021-01-10 DIAGNOSIS — I890 Lymphedema, not elsewhere classified: Secondary | ICD-10-CM | POA: Diagnosis not present

## 2021-01-10 DIAGNOSIS — R262 Difficulty in walking, not elsewhere classified: Secondary | ICD-10-CM | POA: Diagnosis not present

## 2021-01-10 DIAGNOSIS — N183 Chronic kidney disease, stage 3 unspecified: Secondary | ICD-10-CM | POA: Diagnosis not present

## 2021-01-10 DIAGNOSIS — E1122 Type 2 diabetes mellitus with diabetic chronic kidney disease: Secondary | ICD-10-CM | POA: Diagnosis not present

## 2021-01-10 DIAGNOSIS — Z9181 History of falling: Secondary | ICD-10-CM | POA: Diagnosis not present

## 2021-01-11 DIAGNOSIS — I890 Lymphedema, not elsewhere classified: Secondary | ICD-10-CM | POA: Diagnosis not present

## 2021-01-13 DIAGNOSIS — E1122 Type 2 diabetes mellitus with diabetic chronic kidney disease: Secondary | ICD-10-CM

## 2021-01-13 DIAGNOSIS — N1831 Chronic kidney disease, stage 3a: Secondary | ICD-10-CM

## 2021-01-13 DIAGNOSIS — E039 Hypothyroidism, unspecified: Secondary | ICD-10-CM

## 2021-01-13 DIAGNOSIS — N183 Chronic kidney disease, stage 3 unspecified: Secondary | ICD-10-CM

## 2021-01-13 DIAGNOSIS — I129 Hypertensive chronic kidney disease with stage 1 through stage 4 chronic kidney disease, or unspecified chronic kidney disease: Secondary | ICD-10-CM

## 2021-02-09 NOTE — Progress Notes (Signed)
[facility] Note  02/16/2021     CHIEF COMPLAINT Patient presents for Retina Follow Up    HISTORY OF PRESENT ILLNESS: Mary [name] is a 76 y.o. female who presents to the clinic today for:   HPI     Retina Follow Up   Patient presents with  CRVO/BRVO.  In both eyes.  Severity is moderate.  Duration of 7 weeks.  Since onset it is stable.  I, the attending physician,  performed the HPI with the patient and updated documentation appropriately.      Last edited by Bernarda Caffey, MD on 02/19/2021  4:38 PM.    Pt states vision is stable  Referring physician: Glendale Chard, [address] [place],  [place] [address]  HISTORICAL INFORMATION:   Selected notes from the [facility] Transferred care from Dr. Zigmund Daniel   CURRENT MEDICATIONS: Current Outpatient Medications (Ophthalmic Drugs)  Medication Sig   brimonidine (ALPHAGAN) 0.2 % ophthalmic solution Place 1 drop into both eyes 2 (two) times daily.   timolol (BETIMOL) 0.25 % ophthalmic solution Place 1 drop into the right eye 2 (two) times daily.   No current facility-administered medications for this visit. (Ophthalmic Drugs)   Current Outpatient Medications (Other)  Medication Sig   aspirin 81 MG chewable tablet 1 tablet   Assure Comfort Lancets 28G MISC    Blood Glucose Monitoring Suppl (ONETOUCH VERIO FLEX SYSTEM) w/Device KIT Use as directed to check blood sugars 2 times per day dx: e11.65   cetirizine (ZYRTEC) 10 MG tablet Take 1 tablet (10 mg total) by mouth daily.   fluticasone (FLONASE) 50 MCG/ACT nasal spray SPRAY 1 SPRAY INTO EACH NOSTRIL EVERY DAY   furosemide (LASIX) 40 MG tablet TAKE 1 TABLET BY MOUTH  DAILY   glucose blood (ONETOUCH VERIO) test strip Use as directed to check blood sugars 2 times per day dx: e11.65   HYDROcodone-acetaminophen (NORCO) 10-325 MG tablet Take 1 tablet by mouth 5 (five) times daily as needed.   levothyroxine (SYNTHROID) 125 MCG  tablet Take 125 mcg by mouth daily before breakfast.   metoprolol succinate (TOPROL-XL) 50 MG 24 hr tablet TAKE 1 TABLET BY MOUTH  DAILY   omeprazole (PRILOSEC) 20 MG capsule TAKE 1 CAPSULE BY MOUTH  DAILY   OneTouch Delica Lancets 70J MISC Use as directed to check blood sugars 2 times per day dx: e11.65   pravastatin (PRAVACHOL) 40 MG tablet Take 1 tablet (40 mg total) by mouth daily.   telmisartan (MICARDIS) 80 MG tablet TAKE 1 TABLET BY MOUTH  DAILY   triamcinolone (KENALOG) 0.1 % APPLY TO AFFECTED AREA TWICE A DAY   No current facility-administered medications for this visit. (Other)   REVIEW OF SYSTEMS: ROS   Positive for: Genitourinary, Musculoskeletal, Endocrine, Eyes Negative for: Constitutional, Gastrointestinal, Neurological, Skin, HENT, Cardiovascular, Respiratory, Psychiatric, Allergic/Imm, Heme/Lymph Last edited by Jobe Marker, COT on 02/16/2021  8:50 AM.     ALLERGIES No Known Allergies  PAST MEDICAL HISTORY Past Medical History:  Diagnosis Date   Arthritis    osteoarthritis   Cataract    OS   Diabetes mellitus    Insulin dependent   Diabetic retinopathy ([place])    NPDR OU   Glaucoma    POAG OD   H/O cardiovascular stress test    pt. reports 10/13- was normal per pt.    Hyperlipemia    Hypertension    Hypertensive retinopathy    OU  Past Surgical History:  Procedure Laterality Date   CARPAL TUNNEL RELEASE Bilateral    CATARACT EXTRACTION Right 10/05/2018   Dr. Kathlen Mody   EYE SURGERY Right    Cat Sx   KNEE ARTHROSCOPY     right   QUADRICEPS TENDON REPAIR  02/17/2012   Procedure: REPAIR QUADRICEP TENDON;  Surgeon: Vickey Huger, MD;  Location: [facility];  Service: Orthopedics;  Laterality: Right;  right quadriceps repair with latera release   QUADRICEPS TENDON REPAIR Right 05/15/2012   Procedure: REPAIR QUADRICEP TENDON;  Surgeon: Vickey Huger, MD;  Location: [facility];  Service: Orthopedics;  Laterality: Right;   TOTAL KNEE ARTHROPLASTY  10/21/2011   Procedure:  TOTAL KNEE ARTHROPLASTY;  Surgeon: Rudean Haskell, MD;  Location: [facility];  Service: Orthopedics;  Laterality: Right;   TUBAL LIGATION     FAMILY HISTORY Family History  Problem Relation Age of Onset   Hypertension Mother    Heart Problems Father    Gout Father    Arthritis Father    SOCIAL HISTORY Social History   Tobacco Use   Smoking status: Former    Packs/day: 1.00    Years: 30.00    Pack years: 30.00    Types: Cigarettes    Start date: 01/15/1967    Quit date: 06/28/2014    Years since quitting: 6.6   Smokeless tobacco: Never  Vaping Use   Vaping Use: Never used  Substance Use Topics   Alcohol use: No   Drug use: Yes    Types: Hydrocodone       OPHTHALMIC EXAM: Base Eye Exam     Visual Acuity (Snellen - Linear)       Right Left   Dist cc 20/70 -2 HM   Dist ph cc NI NI         Tonometry (Tonopen, 8:59 AM)       Right Left   Pressure 16 16         Pupils       Dark Light Shape React APD   Right 3 2 Round Brisk None   Left 2 2 Round Minimal None         Visual Fields (Counting fingers)       Left Right     Full   Restrictions Total superior temporal, inferior temporal, superior nasal, inferior nasal deficiencies          Extraocular Movement       Right Left    Full, Ortho Full, Ortho         Neuro/Psych     Oriented x3: Yes   Mood/Affect: Normal         Dilation     Both eyes: 1.0% Mydriacyl, 2.5% Phenylephrine @ 8:59 AM           Slit Lamp and Fundus Exam     Slit Lamp Exam       Right Left   Lids/Lashes Dermatochalasis - upper lid, mild Meibomian gland dysfunction Dermatochalasis - upper lid, mild Meibomian gland dysfunction   Conjunctiva/Sclera Melanosis Melanosis   Cornea 2-3+ Punctate epithelial erosions, well healed temporal cataract wounds 3+ Punctate epithelial erosions, well healed temporal cataract wounds   Anterior Chamber Deep and quiet Deep and quiet   Iris Round and moderately dilated to 5.18m Round  and moderately dilated to 665m  Lens PC IOL in good position, trace, cortical remnant temporally, PC folds 2-3+ Nuclear sclerosis, 2-3+ Cortical cataract   Anterior Vitreous Vitreous syneresis, vitreous  condensations, Ozurdex pellet remnants inferiorly, no residual IVT Vitreous syneresis         Fundus Exam       Right Left   Disc 2-3+Pallor, Sharp rim, temporal PPP/PPA, thin superior rim Mild Pallor, Sharp rim, PPP, severely attenuated vessels   C/D Ratio 0.7 0.3   Macula Blunted foveal reflex, Drusen, Retinal pigment epithelial mottling, temporal cystic changes with exudates -- improved Flat, Blunted foveal reflex, RPE mottling and clumping, scarring, atrophy, no heme   Vessels attenuated, Tortuous Severe Vascular attenuation, +fibrosis   Periphery Attached, focal IRH temporal periphery--improved Attached, pavingstone and reticular degeneration inferiorly, scattered RPE atrophy, greatest peripapillary             Refraction     Wearing Rx       Sphere Cylinder Axis Add   Right -1.00 +0.75 180 +2.25   Left -0.50 +0.75 155 +2.25           IMAGING AND PROCEDURES  Imaging and Procedures for _0 @  OCT, Retina - OU - Both Eyes       Right Eye Quality was good. Central Foveal Thickness: 179. Progression has improved. Findings include abnormal foveal contour, intraretinal fluid, intraretinal hyper-reflective material, no SRF, vitreomacular adhesion (Mild Interval improvement in temporal cystic changes / IRF, +vitreous opacities).   Left Eye Quality was good. Central Foveal Thickness: 254. Progression has been stable. Findings include abnormal foveal contour, no IRF, no SRF, outer retinal atrophy, epiretinal membrane, preretinal fibrosis, subretinal hyper-reflective material, pigment epithelial detachment (Stable from prior).   Notes *Images captured and stored on drive  Diagnosis / Impression:  OD: Mild Interval improvement in temporal cystic changes / IRF, +vitreous  opacities OS: diffuse ORA - Stable from prior  Clinical management:  See below  Abbreviations: NFP - Normal foveal profile. CME - cystoid macular edema. PED - pigment epithelial detachment. IRF - intraretinal fluid. SRF - subretinal fluid. EZ - ellipsoid zone. ERM - epiretinal membrane. ORA - outer retinal atrophy. ORT - outer retinal tubulation. SRHM - subretinal hyper-reflective material      Intravitreal Injection, Pharmacologic Agent - OD - Right Eye       Time Out 02/16/2021. 9:35 AM. Confirmed correct patient, procedure, site, and patient consented.   Anesthesia Topical anesthesia was used. Anesthetic medications included Lidocaine 2%, Proparacaine 0.5%.   Procedure Preparation included 5% betadine to ocular surface, eyelid speculum. A 27 gauge needle was used.   Injection: 4 mg triamcinolone acetonide 40 MG/ML   Route: Intravitreal, Site: Right Eye   NDC: [phone], Lot: [id number], Expiration date: 07/14/2022   Post-op Post injection exam found visual acuity of at least counting fingers. The patient tolerated the procedure well. There were no complications. The patient received written and verbal post procedure care education. Post injection medications were not given.   Notes An AC tap was performed following injection due to elevated IOP using a 30 gauge needle on a syringe with the plunger removed. The needle was placed at the limbus at 7 oclock and approximately 0.07 cc of aqueous was removed from the anterior chamber. Betadine was applied to the tap area before and after the paracentesis was performed. There were no complications. The patient tolerated the procedure well. The IOP was rechecked and was found to be ~10 mmHg by tonopen.            ASSESSMENT/PLAN:    ICD-10-CM   1. Central retinal vein occlusion with macular edema of both eyes  H34.8130 OCT, Retina -  OU - Both Eyes    Intravitreal Injection, Pharmacologic Agent - OD - Right Eye    triamcinolone  acetonide (KENALOG-40) injection 4 mg    2. Severe nonproliferative diabetic retinopathy of both eyes with macular edema associated with type 2 diabetes mellitus ([place])  G26.9485     3. Essential hypertension  I10     4. Hypertensive retinopathy of both eyes  H35.033     5. 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 Pseudophakia  Z96.1     7. Primary open angle glaucoma (POAG) of right eye, mild stage  H40.1111      1. CRVO w/ CME OU  - pt of Dr. Zigmund Daniel who wished to transfer care  - OS long-standing low vision (CF) with subfoveal scar and central retinal atrophy  - OD with CME actively being managed with Ozurdex OD ~q5 wks by Dr. Zigmund Daniel (last on 8.20.20) s/p multiple IVTA and Ozurdex OD  - was due for Ozurdex OD w/ Zigmund Daniel on 9.25.20, but underwent cataract surgery OD w/ Kathlen Mody on 9.21.20  - s/p IVTA OD #4 (10.26.20), #5 (12.10.20), #6 (01.21.21), #7 (03.05.21), #8 (04.16.21), #9 (05.28.21), #10 (07.09.21), #11 (09.14.21), #12 (10.29.21), #13 (12.17.21), #14 (01.27.22), #15 (03.11.2022), #16 (05.04.22), #17 (06.17.22), #18 (07.29.22), #19 (09.16.22), #20 (10.28.22), #21 (12.16.22)  - BCVA stable at 20/70  - exam shows no residual IVTA in vitreous cavity  - OCT OD shows interval improvement in temporal IRF/IRHM at 7 wks -- almost resolved; OS - stable from prior  - recommend IVTA OD #22 today, 02.03.23 w/ f/u at 8 wks  - pt wishes to proceed  - RBA of procedure discussed, questions answered  - informed consent obtained  - see procedure note -- AC tap  - f/u in 8 wks -- DFE/OCT/possible injection  2. Severe Non-proliferative diabetic retinopathy OU.  - OCT shows diabetic macular edema, OD   - recommend IVTA OD today, 02.03.23, as above  3,4. Hypertensive retinopathy OU  - discussed importance of tight BP control  - monitor    5. Mixed form age related cataract OS  - under the expert management of Dr. Kathlen Mody  6. Pseudophakia OD  - s/p CE/IOL OD  (9.21.20) by expert surgeon, Dr. Kathlen Mody  - IOL in excellent position  - had post op IOP spike -- IOP now under better control  - monitor   7. POAG OD  - was under the expert management of Dr. Kathlen Mody  - s/p SLT and goniotomy OD  - had post-cataract IOP spike  - on brimonidine BID OU  - IOP 16 today  - AC taps post IVTA injection   Ophthalmic Meds Ordered this visit:  Meds ordered this encounter  Medications   triamcinolone acetonide (KENALOG-40) injection 4 mg     Return in about 8 weeks (around 04/13/2021) for f/u CRVO OU, DFE, OCT.  There are no Patient Instructions on file for this visit.   This document serves as a record of services personally performed by Gardiner Sleeper, MD, PhD. It was created on their behalf by Leonie Douglas, an ophthalmic technician. The creation of this record is the provider's dictation and/or activities during the visit.    Electronically signed by: Leonie Douglas COA, 02/19/21  4:41 PM  This document serves as a record of services personally performed by Gardiner Sleeper, MD, PhD. It was created on their behalf by San Jetty. Owens Shark, OA an ophthalmic technician. The  creation of this record is the provider's dictation and/or activities during the visit.    Electronically signed by: San Jetty. Owens Shark, [place] 02.03.2023 4:41 PM   Gardiner Sleeper, M.D., Ph.D. Diseases & Surgery of the Retina and [place] 02/16/2021  I have reviewed the above documentation for accuracy and completeness, and I agree with the above. Gardiner Sleeper, M.D., Ph.D. 02/19/21 4:44 PM   Abbreviations: M myopia (nearsighted); A astigmatism; H hyperopia (farsighted); P presbyopia; Mrx spectacle prescription;  CTL contact lenses; OD right eye; OS left eye; OU both eyes  XT exotropia; ET esotropia; PEK punctate epithelial keratitis; PEE punctate epithelial erosions; DES dry eye syndrome; MGD meibomian gland dysfunction; ATs artificial tears; PFAT's preservative  free artificial tears; [place] nuclear sclerotic cataract; PSC posterior subcapsular cataract; ERM epi-retinal membrane; PVD posterior vitreous detachment; RD retinal detachment; DM diabetes mellitus; DR diabetic retinopathy; NPDR non-proliferative diabetic retinopathy; PDR proliferative diabetic retinopathy; CSME clinically significant macular edema; DME diabetic macular edema; dbh dot blot hemorrhages; CWS cotton wool spot; POAG primary open angle glaucoma; C/D cup-to-disc ratio; HVF humphrey visual field; GVF goldmann visual field; OCT optical coherence tomography; IOP intraocular pressure; BRVO Branch retinal vein occlusion; CRVO central retinal vein occlusion; CRAO central retinal artery occlusion; BRAO branch retinal artery occlusion; RT retinal tear; SB scleral buckle; PPV pars plana vitrectomy; VH Vitreous hemorrhage; PRP panretinal laser photocoagulation; IVK intravitreal kenalog; VMT vitreomacular traction; MH Macular hole;  NVD neovascularization of the disc; NVE neovascularization elsewhere; AREDS age related eye disease study; ARMD age related macular degeneration; POAG primary open angle glaucoma; EBMD epithelial/anterior basement membrane dystrophy; ACIOL anterior chamber intraocular lens; IOL intraocular lens; PCIOL posterior chamber intraocular lens; Phaco/IOL phacoemulsification with intraocular lens placement; [place] photorefractive keratectomy; LASIK laser assisted in situ keratomileusis; HTN hypertension; DM diabetes mellitus; COPD chronic obstructive pulmonary disease

## 2021-02-11 ENCOUNTER — Other Ambulatory Visit: Payer: Self-pay | Admitting: Internal Medicine

## 2021-02-12 ENCOUNTER — Other Ambulatory Visit: Payer: Self-pay

## 2021-02-12 MED ORDER — CETIRIZINE HCL 10 MG PO TABS: 10.0000 mg | ORAL_TABLET | Freq: Every day | ORAL | 1 refills | Status: DC

## 2021-02-12 MED ORDER — PRAVASTATIN SODIUM 40 MG PO TABS: 40.0000 mg | ORAL_TABLET | Freq: Every day | ORAL | 1 refills | Status: DC

## 2021-02-14 ENCOUNTER — Telehealth: Payer: Medicare Other

## 2021-02-14 ENCOUNTER — Ambulatory Visit (INDEPENDENT_AMBULATORY_CARE_PROVIDER_SITE_OTHER): Payer: Self-pay

## 2021-02-14 DIAGNOSIS — I890 Lymphedema, not elsewhere classified: Secondary | ICD-10-CM

## 2021-02-14 DIAGNOSIS — N1831 Chronic kidney disease, stage 3a: Secondary | ICD-10-CM

## 2021-02-14 DIAGNOSIS — N183 Chronic kidney disease, stage 3 unspecified: Secondary | ICD-10-CM

## 2021-02-14 DIAGNOSIS — E039 Hypothyroidism, unspecified: Secondary | ICD-10-CM

## 2021-02-14 DIAGNOSIS — E1122 Type 2 diabetes mellitus with diabetic chronic kidney disease: Secondary | ICD-10-CM

## 2021-02-14 DIAGNOSIS — I129 Hypertensive chronic kidney disease with stage 1 through stage 4 chronic kidney disease, or unspecified chronic kidney disease: Secondary | ICD-10-CM

## 2021-02-14 DIAGNOSIS — E669 Obesity, unspecified: Secondary | ICD-10-CM

## 2021-02-15 NOTE — Patient Instructions (Signed)
Visit Information  Thank you for taking time to visit with me today. Please don't hesitate to contact me if I can be of assistance to you before our next scheduled telephone appointment.  Following are the goals we discussed today:  (Copy and paste patient goals from clinical care plan here)  Our next appointment is by telephone on 04/30/21 at 10:30 AM   Please call the care guide team at [phone] if you need to cancel or reschedule your appointment.   If you are experiencing a Mental Health or Behavioral Health Crisis or need someone to talk to, please call 1-800-273-TALK (toll free, 24 hour hotline)   Patient verbalizes understanding of instructions and care plan provided today and agrees to view in MyChart. Active MyChart status confirmed with patient.    Delsa Sale, RN, BSN, CCM Care Management Coordinator [facility]  Direct Phone: [phone]

## 2021-02-15 NOTE — Chronic Care Management (AMB) (Signed)
Chronic Care Management   CCM RN Visit Note  02/14/2021 Name: Mary [name] MRN: [id number] DOB: Jul 23, 1945  Subjective: Mary [name] is a 76 y.o. year old female who is a primary care patient of Glendale Chard, MD. The care management team was consulted for assistance with disease management and care coordination needs.    Engaged with patient by telephone for follow up visit in response to provider referral for case management and/or care coordination services.   Consent to Services:  The patient was given information about Chronic Care Management services, agreed to services, and gave verbal consent prior to initiation of services.  Please see initial visit note for detailed documentation.   Patient agreed to services and verbal consent obtained.   Assessment: Review of patient past medical history, allergies, medications, health status, including review of consultants reports, laboratory and other test data, was performed as part of comprehensive evaluation and provision of chronic care management services.   SDOH (Social Determinants of Health) assessments and interventions performed:  Yes, no acute changes   CCM Care Plan  No Known Allergies  Outpatient Encounter Medications as of 02/14/2021  Medication Sig   aspirin 81 MG chewable tablet 1 tablet   Assure Comfort Lancets 28G MISC    Blood Glucose Monitoring Suppl (ONETOUCH VERIO FLEX SYSTEM) w/Device KIT Use as directed to check blood sugars 2 times per day dx: e11.65   brimonidine (ALPHAGAN) 0.2 % ophthalmic solution Place 1 drop into both eyes 2 (two) times daily.   cetirizine (ZYRTEC) 10 MG tablet Take 1 tablet (10 mg total) by mouth daily.   fluticasone (FLONASE) 50 MCG/ACT nasal spray SPRAY 1 SPRAY INTO EACH NOSTRIL EVERY DAY   furosemide (LASIX) 40 MG tablet TAKE 1 TABLET BY MOUTH  DAILY   glucose blood (ONETOUCH VERIO) test strip Use as directed to check blood sugars 2 times per day dx: e11.65    HYDROcodone-acetaminophen (NORCO) 10-325 MG tablet Take 1 tablet by mouth 5 (five) times daily as needed.   levothyroxine (SYNTHROID) 125 MCG tablet Take 125 mcg by mouth daily before breakfast.   metoprolol succinate (TOPROL-XL) 50 MG 24 hr tablet TAKE 1 TABLET BY MOUTH  DAILY   omeprazole (PRILOSEC) 20 MG capsule TAKE 1 CAPSULE BY MOUTH  DAILY   OneTouch Delica Lancets 16X MISC Use as directed to check blood sugars 2 times per day dx: e11.65   pravastatin (PRAVACHOL) 40 MG tablet Take 1 tablet (40 mg total) by mouth daily.   telmisartan (MICARDIS) 80 MG tablet TAKE 1 TABLET BY MOUTH  DAILY   timolol (BETIMOL) 0.25 % ophthalmic solution Place 1 drop into the right eye 2 (two) times daily.   triamcinolone (KENALOG) 0.1 % APPLY TO AFFECTED AREA TWICE A DAY   No facility-administered encounter medications on file as of 02/14/2021.    Patient Active Problem List   Diagnosis Date Noted   Change in bowel habit 01/20/2020   Chronic anemia 01/20/2020   Colon cancer screening 01/20/2020   Diabetes mellitus with stage 3 chronic kidney disease ([place]) 02/21/2018   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   Primary osteoarthritis of both knees 02/21/2018   Class 3 severe obesity due to excess calories with serious comorbidity and body mass index (BMI) of 45.0 to 49.9 in adult [facility]) 02/21/2018   Skin ulcer of right foot with fat layer exposed ([place]) 02/18/2018   Morbid (severe) obesity due to excess calories ([place]) 11/18/2017   Knee pain 07/16/2015   Lateral dislocation of  right patella 05/04/2015   Rupture of right quadriceps tendon 05/24/2014   S/P TKR (total knee replacement) 05/05/2012    Conditions to be addressed/monitored: Diabetes Mellitus with stage 3 Chronic Kidney disease, Parenchymal renal hypertension, Class 3 severe obesity, Edema of left lower extremity, Primary Hypothyroidism, Lymphedema associated with Obesity     Care Plan : [place] of Care  Updates made by Lynne Logan, RN since 02/14/2021 12:00 AM     Problem: No Plan of Care for chronic disease states Diabetes Mellitus with stage 3 Chronic Kidney disease,Parenchymal renal hypertension,Class 3 severe obesity,Edema of left lower extremity,Primary Hypothyroidism,Lymphedema associated with Obesity   Priority: High     Long-Range Goal: Plan of care for chronic disease management (Diabetes Mellitus with stage 3 Chronic Kidney disease, Parenchymal renal hypertension, Class 3 severe obesity,Edema of left lower extremity,Primary Hypothyroidism,Lymphedema associated with Obesity  )   Start Date: 12/14/2020  Expected End Date: 12/14/2021  Recent Progress: On track  Priority: High  Note:   Current Barriers:  Knowledge Deficits related to plan of care for management of Diabetes Mellitus with stage 3 Chronic Kidney disease, Parenchymal renal hypertension, Class 3 severe obesity, Edema of left lower extremity, Primary Hypothyroidism    Chronic Disease Management support and education needs related to Diabetes Mellitus with stage 3 Chronic Kidney disease, Parenchymal renal hypertension, Class 3 severe obesity, Edema of left lower extremity, Primary Hypothyroidism    Lacks caregiver support  RNCM Clinical Goal(s):  Patient will verbalize basic understanding of  Diabetes Mellitus with stage 3 Chronic Kidney disease, Parenchymal renal hypertension, Class 3 severe obesity, Edema of left lower extremity, Primary Hypothyroidism   disease process and self health management plan as evidenced by patient will report having no disease exacerbations related to her chronic disease states  take all medications exactly as prescribed and will call provider for medication related questions as evidenced by patient will report having no missed doses of her prescribed medications demonstrate Improved health management independence as evidenced by patient will maintain the ability to perform self care  continue to work with RN Care Manager  to address care management and care coordination needs related to  Diabetes Mellitus with stage 3 Chronic Kidney disease, Parenchymal renal hypertension, Class 3 severe obesity, Edema of left lower extremity, Primary Hypothyroidism   as evidenced by adherence to CM Team Scheduled appointments demonstrate ongoing self health care management ability   as evidenced by    through collaboration with RN Care manager, provider, and care team.   Interventions: 1:1 collaboration with primary care provider regarding development and update of comprehensive plan of care as evidenced by provider attestation and co-signature Inter-disciplinary care team collaboration (see longitudinal plan of care) Evaluation of current treatment plan related to  self management and patient's adherence to plan as established by provider  Diabetes Interventions:  (Status:  Condition stable.  Not addressed this visit.) Long Term Goal Assessed patient's understanding of A1c goal:  <5.7 Reviewed medications with patient and discussed importance of medication adherence Counseled on importance of regular laboratory monitoring as prescribed Review of patient status, including review of consultants reports, relevant laboratory and other test results, and medications completed Discussed plans with patient for ongoing care management follow up and provided patient with direct contact information for care management team Lab Results  Component Value Date   HGBA1C 5.2 12/12/2020   Chronic Kidney Disease Interventions:  (Status:  Condition stable.  Not addressed this visit.) Long Term Goal Reviewed  prescribed diet increase water to 64 oz daily unless otherwise directed Screening for signs and symptoms of depression related to chronic disease state      Provided education on kidney disease progression    Discussed plans with patient for ongoing care management follow up and provided patient with direct contact information for care  management team Last practice recorded BP readings:  BP Readings from Last 3 Encounters:  12/12/20 134/76  07/27/20 126/88  07/23/20 (!) 150/53  Most recent eGFR/CrCl:  Lab Results  Component Value Date   EGFR 56 (L) 12/12/2020    No components found for: CRCL   Diabetic Retinopathy Interventions:  (Status:  New goal.)  Long Term Goal Evaluation of current treatment plan related to  Diabetic Retinopathy of both eyes , self-management and patient's adherence to plan as established by provider Educated patient on basic disease process related to Diabetic Retinopathy, educated on complications and stages of this disease  Review of patient status, including review of consultant's reports, relevant laboratory and other test results, and medications completed Reviewed medications with patient and discussed importance of medication adherence Assessed for change in vision acuity, patient reports her vision remains to be stable with no significant change  Reinforced the importance for patient to attend all scheduled provider appointments and to notify her Eye specialist promptly for new or worsening symptoms Discussed plans with patient for ongoing care management follow up and provided patient with direct contact information for care management team  Lymphedema Interventions:  (Status:  New goal.)  Long Term Goal Evaluation of current treatment plan related to  Lymphedema , self-management and patient's adherence to plan as established by provider Determined patient continues to wear lymphedema pumps bilaterally, although she is not able to apply the pumps independently Discussed her CAP worker is assisting twice weekly, her daughter is available on the weekends, patient is need of assistance a few days weekly  Collaborated with embedded BSW [name] regarding assisting patient with a call to the CAP director to assess for additional hours and or coordinate current CAP worker to assist patient  M-F with applying her lymphedema pumps Educated patient on basic disease process related to lymphedema and the importance of adherence to her prescribed treatment plan  Discussed plans with patient for ongoing care management follow up and provided patient with direct contact information for care management team    Patient Goals/Self-Care Activities: Take all medications as prescribed Attend all scheduled provider appointments Call pharmacy for medication refills 3-7 days in advance of running out of medications Perform all self care activities independently  Perform IADL's (shopping, preparing meals, housekeeping, managing finances) independently Call provider office for new concerns or questions  drink 6 to 8 glasses of water each day manage portion size  Follow Up Plan:  Telephone follow up appointment with care management team member scheduled for:  04/30/21      Plan:Telephone follow up appointment with care management team member scheduled for:  04/30/21  Barb Merino, RN, BSN, CCM Care Management Coordinator [place] [facility]  Direct Phone: [phone]

## 2021-02-16 ENCOUNTER — Other Ambulatory Visit: Payer: Self-pay

## 2021-02-16 ENCOUNTER — Ambulatory Visit (INDEPENDENT_AMBULATORY_CARE_PROVIDER_SITE_OTHER): Payer: Medicare Other | Admitting: Ophthalmology

## 2021-02-16 ENCOUNTER — Encounter (INDEPENDENT_AMBULATORY_CARE_PROVIDER_SITE_OTHER): Payer: Self-pay | Admitting: Ophthalmology

## 2021-02-16 DIAGNOSIS — E113413 Type 2 diabetes mellitus with severe nonproliferative diabetic retinopathy with macular edema, bilateral: Secondary | ICD-10-CM | POA: Diagnosis not present

## 2021-02-16 DIAGNOSIS — H35033 Hypertensive retinopathy, bilateral: Secondary | ICD-10-CM | POA: Diagnosis not present

## 2021-02-16 DIAGNOSIS — H25812 Combined forms of age-related cataract, left eye: Secondary | ICD-10-CM

## 2021-02-16 DIAGNOSIS — I10 Essential (primary) hypertension: Secondary | ICD-10-CM | POA: Diagnosis not present

## 2021-02-16 DIAGNOSIS — Z961 Presence of intraocular lens: Secondary | ICD-10-CM

## 2021-02-16 DIAGNOSIS — H34813 Central retinal vein occlusion, bilateral, with macular edema: Secondary | ICD-10-CM | POA: Diagnosis not present

## 2021-02-16 DIAGNOSIS — H401111 Primary open-angle glaucoma, right eye, mild stage: Secondary | ICD-10-CM

## 2021-02-19 ENCOUNTER — Ambulatory Visit: Payer: Medicare Other

## 2021-02-19 ENCOUNTER — Encounter (INDEPENDENT_AMBULATORY_CARE_PROVIDER_SITE_OTHER): Payer: Self-pay | Admitting: Ophthalmology

## 2021-02-19 DIAGNOSIS — I129 Hypertensive chronic kidney disease with stage 1 through stage 4 chronic kidney disease, or unspecified chronic kidney disease: Secondary | ICD-10-CM

## 2021-02-19 DIAGNOSIS — N183 Chronic kidney disease, stage 3 unspecified: Secondary | ICD-10-CM

## 2021-02-19 DIAGNOSIS — E1122 Type 2 diabetes mellitus with diabetic chronic kidney disease: Secondary | ICD-10-CM

## 2021-02-19 MED ORDER — TRIAMCINOLONE ACETONIDE 40 MG/ML IJ SUSP FOR KALEIDOSCOPE
4.0000 mg | INTRAMUSCULAR | Status: AC | PRN
  Administered 2021-02-19: 4 mg via INTRAVITREAL

## 2021-02-19 NOTE — Patient Instructions (Signed)
Social Worker Visit Information  Goals we discussed today:   Goals Addressed             This Visit's Progress    work with SW to manage care coordination needs   On track    Timeframe:  Long-Range Goal Priority:  Low Start Date:  2.1.22                                               Next planned outreach: 2.22.23  Patient Goals/Self-Care Activities  patient will:    - Engage with CAP program for care needs -Contact SW as needed prior to next scheduled call         Materials Provided: Verbal education about caregiver resources provided by phone  Patient verbalizes understanding of instructions and care plan provided today and agrees to view in [place]. Active MyChart status confirmed with patient.    Follow Up Plan: SW will follow up with patient by phone over the next 3 weeks   Bevelyn Ngo, BSW, CDP Social Worker, Certified Dementia Practitioner [facility] Care Management [phone]

## 2021-02-19 NOTE — Chronic Care Management (AMB) (Signed)
Chronic Care Management    Social Work Note  02/19/2021 Name: Mary [name] MRN: [id number] DOB: 1945-05-27  Mary [name] is a 76 y.o. year old female who is a primary care patient of Glendale Chard, MD. The CCM team was consulted to assist the patient with chronic disease management and/or care coordination needs related to:  DM II, CKD III, Parenchymal Renal HTN .   Engaged with patient by telephone for follow up visit in response to provider referral for social work chronic care management and care coordination services.   Consent to Services:  The patient was given information about Chronic Care Management services, agreed to services, and gave verbal consent prior to initiation of services.  Please see initial visit note for detailed documentation.   Patient agreed to services and consent obtained.   Assessment: Review of patient past medical history, allergies, medications, and health status, including review of relevant consultants reports was performed today as part of a comprehensive evaluation and provision of chronic care management and care coordination services.     SDOH (Social Determinants of Health) assessments and interventions performed:    Advanced Directives Status: Not addressed in this encounter.  CCM Care Plan  No Known Allergies  Outpatient Encounter Medications as of 02/19/2021  Medication Sig   aspirin 81 MG chewable tablet 1 tablet   Assure Comfort Lancets 28G MISC    Blood Glucose Monitoring Suppl (ONETOUCH VERIO FLEX SYSTEM) w/Device KIT Use as directed to check blood sugars 2 times per day dx: e11.65   brimonidine (ALPHAGAN) 0.2 % ophthalmic solution Place 1 drop into both eyes 2 (two) times daily.   cetirizine (ZYRTEC) 10 MG tablet Take 1 tablet (10 mg total) by mouth daily.   fluticasone (FLONASE) 50 MCG/ACT nasal spray SPRAY 1 SPRAY INTO EACH NOSTRIL EVERY DAY   furosemide (LASIX) 40 MG tablet TAKE 1 TABLET BY MOUTH  DAILY   glucose blood  (ONETOUCH VERIO) test strip Use as directed to check blood sugars 2 times per day dx: e11.65   HYDROcodone-acetaminophen (NORCO) 10-325 MG tablet Take 1 tablet by mouth 5 (five) times daily as needed.   levothyroxine (SYNTHROID) 125 MCG tablet Take 125 mcg by mouth daily before breakfast.   metoprolol succinate (TOPROL-XL) 50 MG 24 hr tablet TAKE 1 TABLET BY MOUTH  DAILY   omeprazole (PRILOSEC) 20 MG capsule TAKE 1 CAPSULE BY MOUTH  DAILY   OneTouch Delica Lancets 27O MISC Use as directed to check blood sugars 2 times per day dx: e11.65   pravastatin (PRAVACHOL) 40 MG tablet Take 1 tablet (40 mg total) by mouth daily.   telmisartan (MICARDIS) 80 MG tablet TAKE 1 TABLET BY MOUTH  DAILY   timolol (BETIMOL) 0.25 % ophthalmic solution Place 1 drop into the right eye 2 (two) times daily.   triamcinolone (KENALOG) 0.1 % APPLY TO AFFECTED AREA TWICE A DAY   No facility-administered encounter medications on file as of 02/19/2021.    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   Primary osteoarthritis of both knees 02/21/2018   Class 3 severe obesity due to excess calories with serious comorbidity and body mass index (BMI) of 45.0 to 49.9 in adult [facility]) 02/21/2018   Skin ulcer of right foot with fat layer exposed ([place])  02/18/2018   Morbid (severe) obesity due to excess calories ([place]) 11/18/2017   Knee pain 07/16/2015   Lateral dislocation of right patella 05/04/2015   Rupture of right quadriceps tendon 05/24/2014   S/P TKR (total knee replacement) 05/05/2012    Conditions to be addressed/monitored: HTN, DMII, and CKD Stage III ; Limited access to caregiver  Care Plan : Social Work Care Plan  Updates made by [name] since 02/19/2021 12:00 AM     Problem: Care  Coordination      Long-Range Goal: Identify resources to assist with care coordination needs   Start Date: 02/15/2020  This Visit's Progress: On track  Recent Progress: On track  Priority: Low  Note:   Current Barriers:  Limited social support Transportation ADL IADL limitations Chronic conditions including DM II, CKD III, and obesity which put patient at increased risk for hospitalization   Social Work Clinical Goal(s):  patient will work with SW to address concerns related to care coordination  Interventions: 1:1 collaboration with Glendale Chard, MD regarding development and update of comprehensive plan of care as evidenced by provider attestation and co-signature Inter-disciplinary care team collaboration (see longitudinal plan of care) Collaboration with RN Care Manager who indicates patient is in need of increased caregiver hours to assist with lymphedema pumps Telephonic visit completed with the patient who confirms she has a CAP caregiver twice a week (Tues, Thurs) for 4 hours each day Discussed the patient is supposed to wear lymphedema pumps each day for one hour, patient is not able to apply or remove pumps on her own Determined the patients daughter assists with care needs on the weekend but is unable to assist during the week Advised the patient SW would contact Ilda Mori CAP Supervisor to determine if hours may be increased adding 2 hours of care M, W, F to assist the patient with lymphedema pumps Discussed that if this request is denied the only other option the patient has is to privately hire a caregiver; patient stated understanding Determined patient remains on wait list for in home aid caregiver offered by [facility] the patient SW is unclear if this program will provide services since she is active with CAP but that will be determined during assessment of the program  Scheduled follow up call over the next 3 weeks  Patient Goals/Self-Care  Activities  patient will:   - Engage with CAP program for care needs -Contact SW as needed prior to next scheduled call  Follow up Plan: SW will follow up with patient by phone over the next 21 days       Follow Up Plan: SW will follow up with patient by phone over the next three weeks      [name], BSW, CDP Social Worker, Certified Dementia Practitioner [place] [facility] [place] [facility] [phone]

## 2021-02-23 DIAGNOSIS — Z96659 Presence of unspecified artificial knee joint: Secondary | ICD-10-CM | POA: Diagnosis not present

## 2021-02-23 DIAGNOSIS — M170 Bilateral primary osteoarthritis of knee: Secondary | ICD-10-CM | POA: Diagnosis not present

## 2021-02-23 DIAGNOSIS — E1122 Type 2 diabetes mellitus with diabetic chronic kidney disease: Secondary | ICD-10-CM | POA: Diagnosis not present

## 2021-02-23 DIAGNOSIS — I129 Hypertensive chronic kidney disease with stage 1 through stage 4 chronic kidney disease, or unspecified chronic kidney disease: Secondary | ICD-10-CM | POA: Diagnosis not present

## 2021-02-27 DIAGNOSIS — L97511 Non-pressure chronic ulcer of other part of right foot limited to breakdown of skin: Secondary | ICD-10-CM | POA: Diagnosis not present

## 2021-02-27 DIAGNOSIS — M2041 Other hammer toe(s) (acquired), right foot: Secondary | ICD-10-CM | POA: Diagnosis not present

## 2021-02-27 DIAGNOSIS — E0840 Diabetes mellitus due to underlying condition with diabetic neuropathy, unspecified: Secondary | ICD-10-CM | POA: Diagnosis not present

## 2021-02-27 DIAGNOSIS — L84 Corns and callosities: Secondary | ICD-10-CM | POA: Diagnosis not present

## 2021-02-27 DIAGNOSIS — M2141 Flat foot [pes planus] (acquired), right foot: Secondary | ICD-10-CM | POA: Diagnosis not present

## 2021-02-27 DIAGNOSIS — B351 Tinea unguium: Secondary | ICD-10-CM | POA: Diagnosis not present

## 2021-03-02 ENCOUNTER — Telehealth: Payer: Self-pay

## 2021-03-02 ENCOUNTER — Other Ambulatory Visit: Payer: Self-pay | Admitting: Internal Medicine

## 2021-03-02 DIAGNOSIS — S91109D Unspecified open wound of unspecified toe(s) without damage to nail, subsequent encounter: Secondary | ICD-10-CM

## 2021-03-02 NOTE — Telephone Encounter (Signed)
Hey this pt previously used [facility] for her wound care.

## 2021-03-06 DIAGNOSIS — I129 Hypertensive chronic kidney disease with stage 1 through stage 4 chronic kidney disease, or unspecified chronic kidney disease: Secondary | ICD-10-CM | POA: Diagnosis not present

## 2021-03-06 DIAGNOSIS — M1712 Unilateral primary osteoarthritis, left knee: Secondary | ICD-10-CM | POA: Diagnosis not present

## 2021-03-06 DIAGNOSIS — B351 Tinea unguium: Secondary | ICD-10-CM | POA: Diagnosis not present

## 2021-03-06 DIAGNOSIS — E0840 Diabetes mellitus due to underlying condition with diabetic neuropathy, unspecified: Secondary | ICD-10-CM | POA: Diagnosis not present

## 2021-03-06 DIAGNOSIS — I890 Lymphedema, not elsewhere classified: Secondary | ICD-10-CM | POA: Diagnosis not present

## 2021-03-06 DIAGNOSIS — M1711 Unilateral primary osteoarthritis, right knee: Secondary | ICD-10-CM | POA: Diagnosis not present

## 2021-03-06 DIAGNOSIS — E1122 Type 2 diabetes mellitus with diabetic chronic kidney disease: Secondary | ICD-10-CM | POA: Diagnosis not present

## 2021-03-06 DIAGNOSIS — D649 Anemia, unspecified: Secondary | ICD-10-CM | POA: Diagnosis not present

## 2021-03-06 DIAGNOSIS — L97511 Non-pressure chronic ulcer of other part of right foot limited to breakdown of skin: Secondary | ICD-10-CM | POA: Diagnosis not present

## 2021-03-06 DIAGNOSIS — N183 Chronic kidney disease, stage 3 unspecified: Secondary | ICD-10-CM | POA: Diagnosis not present

## 2021-03-07 ENCOUNTER — Ambulatory Visit: Payer: Medicare Other

## 2021-03-07 DIAGNOSIS — N1831 Type 2 diabetes mellitus with diabetic chronic kidney disease: Secondary | ICD-10-CM

## 2021-03-07 DIAGNOSIS — I129 Hypertensive chronic kidney disease with stage 1 through stage 4 chronic kidney disease, or unspecified chronic kidney disease: Secondary | ICD-10-CM

## 2021-03-07 DIAGNOSIS — E1122 Type 2 diabetes mellitus with diabetic chronic kidney disease: Secondary | ICD-10-CM

## 2021-03-07 DIAGNOSIS — N183 Chronic kidney disease, stage 3 unspecified: Secondary | ICD-10-CM

## 2021-03-07 NOTE — Chronic Care Management (AMB) (Signed)
Chronic Care Management    Social Work Note  03/07/2021 Name: Mary [name] MRN: [id number] DOB: 1945/09/10  Mary [name] is a 76 y.o. year old female who is a primary care patient of Mary Chard, MD. The CCM team was consulted to assist the patient with chronic disease management and/or care coordination needs related to:  DM, CKD III, Parenchymal Renal HTN .   Engaged with patient by telephone for follow up visit in response to provider referral for social work chronic care management and care coordination services.   Consent to Services:  The patient was given information about Chronic Care Management services, agreed to services, and gave verbal consent prior to initiation of services.  Please see initial visit note for detailed documentation.   Patient agreed to services and consent obtained.   Assessment: Review of patient past medical history, allergies, medications, and health status, including review of relevant consultants reports was performed today as part of a comprehensive evaluation and provision of chronic care management and care coordination services.     SDOH (Social Determinants of Health) assessments and interventions performed:    Advanced Directives Status: Not addressed in this encounter.  CCM Care Plan  No Known Allergies  Outpatient Encounter Medications as of 03/07/2021  Medication Sig   aspirin 81 MG chewable tablet 1 tablet   Assure Comfort Lancets 28G MISC    Blood Glucose Monitoring Suppl (ONETOUCH VERIO FLEX SYSTEM) w/Device KIT Use as directed to check blood sugars 2 times per day dx: e11.65   brimonidine (ALPHAGAN) 0.2 % ophthalmic solution Place 1 drop into both eyes 2 (two) times daily.   cetirizine (ZYRTEC) 10 MG tablet Take 1 tablet (10 mg total) by mouth daily.   fluticasone (FLONASE) 50 MCG/ACT nasal spray SPRAY 1 SPRAY INTO EACH NOSTRIL EVERY DAY   furosemide (LASIX) 40 MG tablet TAKE 1 TABLET BY MOUTH  DAILY   glucose blood  (ONETOUCH VERIO) test strip Use as directed to check blood sugars 2 times per day dx: e11.65   HYDROcodone-acetaminophen (NORCO) 10-325 MG tablet Take 1 tablet by mouth 5 (five) times daily as needed.   levothyroxine (SYNTHROID) 125 MCG tablet Take 125 mcg by mouth daily before breakfast.   metoprolol succinate (TOPROL-XL) 50 MG 24 hr tablet TAKE 1 TABLET BY MOUTH  DAILY   omeprazole (PRILOSEC) 20 MG capsule TAKE 1 CAPSULE BY MOUTH  DAILY   OneTouch Delica Lancets 39Q MISC Use as directed to check blood sugars 2 times per day dx: e11.65   pravastatin (PRAVACHOL) 40 MG tablet Take 1 tablet (40 mg total) by mouth daily.   telmisartan (MICARDIS) 80 MG tablet TAKE 1 TABLET BY MOUTH  DAILY   timolol (BETIMOL) 0.25 % ophthalmic solution Place 1 drop into the right eye 2 (two) times daily.   triamcinolone (KENALOG) 0.1 % APPLY TO AFFECTED AREA TWICE A DAY   No facility-administered encounter medications on file as of 03/07/2021.    Patient Active Problem List   Diagnosis Date Noted   Change in bowel habit 01/20/2020   Chronic anemia 01/20/2020   Colon cancer screening 01/20/2020   Diabetes mellitus with stage 3 chronic kidney disease ([place]) 02/21/2018   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   Primary osteoarthritis of both knees 02/21/2018   Class 3 severe obesity due to excess calories with serious comorbidity and body mass index (BMI) of 45.0 to 49.9 in adult [name]) 02/21/2018   Skin ulcer of right foot with fat layer exposed ([place]) 02/18/2018  Morbid (severe) obesity due to excess calories ([place]) 11/18/2017   Knee pain 07/16/2015   Lateral dislocation of right patella 05/04/2015   Rupture of right quadriceps tendon 05/24/2014   S/P TKR (total knee replacement) 05/05/2012    Conditions to be addressed/monitored: DMII, CKD Stage III, and Parenchymal Renal HTN ; Limited access to caregiver  Care Plan : Social Work Care Plan  Updates made by Mary [name] since 03/07/2021 12:00 AM      Problem: Care Coordination      Long-Range Goal: Identify resources to assist with care coordination needs   Start Date: 02/15/2020  This Visit's Progress: On track  Recent Progress: On track  Priority: Low  Note:   Current Barriers:  Limited social support Transportation ADL IADL limitations Chronic conditions including DM II, CKD III, and obesity which put patient at increased risk for hospitalization   Social Work Clinical Goal(s):  patient will work with SW to address concerns related to care coordination  Interventions: 1:1 collaboration with Mary Chard, MD regarding development and update of comprehensive plan of care as evidenced by provider attestation and co-signature Inter-disciplinary care team collaboration (see longitudinal plan of care) Collaboration with Mary [name] CAP worker Mary [name] who indicates patient is not currently receiving CAP services Telephonic visit completed with the patient to discuss outcome of call with Cap/DA program Determined the patients caregiver is assisting under the [facility] in home aid Mary [name]) program Reviewed the patient receives a caregiver twice a week for Mary hours Discussed the patient is interested in obtaining more hours but was advised by the in home aid program they could not offer more Reviewed Medicaid PCS program with the patient - patient is over the income limit for Medicaid and denies needing assistance with 2 or more ADL's Patient reports the in home aid nurse discussed placement with the patient last week due to level of care needs but patient declined opportunity for placement Determined the patient did obtain a wound to her foot last week and is now active with home health for wound care - patient feels this wound is healing well Discussed plans for SW to follow up with the patient over the next month to assess for ongoing care coordination needs  Patient Goals/Self-Care Activities  patient will:   - Engage  with the [facility] in home aid program as needed regarding care plan needs -Contact home health as needed regarding wound care needs -Contact SW as needed prior to next scheduled call  Follow up Plan: SW will follow up with patient by phone over the next month       Follow Up Plan: SW will follow up with patient by phone over the next month      Mary [name], BSW, CDP Social Worker, Certified Dementia Practitioner [place] [name] / [place] [facility] [phone]

## 2021-03-07 NOTE — Patient Instructions (Signed)
Social Worker Visit Information  Goals we discussed today:   Goals Addressed             This Visit's Progress    work with SW to manage care coordination needs   On track    Timeframe:  Long-Range Goal Priority:  Low Start Date:  2.1.22                                               Next planned outreach: 3.22.23  Patient Goals/Self-Care Activities  patient will:   - Engage with the [facility] in home aid program as needed regarding care plan needs -Contact home health as needed regarding wound care needs -Contact SW as needed prior to next scheduled call          Materials Provided: Verbal education about caregiver resources provided by phone  Patient verbalizes understanding of instructions and care plan provided today and agrees to view in [place]. Active MyChart status confirmed with patient.    Follow Up Plan: SW will follow up with patient by phone over the next month   Bevelyn Ngo, BSW, CDP Social Worker, Certified Dementia Practitioner [facility] / [facility] Care Management [phone]

## 2021-03-09 DIAGNOSIS — D649 Anemia, unspecified: Secondary | ICD-10-CM | POA: Diagnosis not present

## 2021-03-09 DIAGNOSIS — I890 Lymphedema, not elsewhere classified: Secondary | ICD-10-CM | POA: Diagnosis not present

## 2021-03-09 DIAGNOSIS — M1712 Unilateral primary osteoarthritis, left knee: Secondary | ICD-10-CM | POA: Diagnosis not present

## 2021-03-09 DIAGNOSIS — L97511 Non-pressure chronic ulcer of other part of right foot limited to breakdown of skin: Secondary | ICD-10-CM | POA: Diagnosis not present

## 2021-03-09 DIAGNOSIS — M1711 Unilateral primary osteoarthritis, right knee: Secondary | ICD-10-CM | POA: Diagnosis not present

## 2021-03-09 DIAGNOSIS — B351 Tinea unguium: Secondary | ICD-10-CM | POA: Diagnosis not present

## 2021-03-09 DIAGNOSIS — N183 Chronic kidney disease, stage 3 unspecified: Secondary | ICD-10-CM | POA: Diagnosis not present

## 2021-03-09 DIAGNOSIS — I129 Hypertensive chronic kidney disease with stage 1 through stage 4 chronic kidney disease, or unspecified chronic kidney disease: Secondary | ICD-10-CM | POA: Diagnosis not present

## 2021-03-09 DIAGNOSIS — E0840 Diabetes mellitus due to underlying condition with diabetic neuropathy, unspecified: Secondary | ICD-10-CM | POA: Diagnosis not present

## 2021-03-09 DIAGNOSIS — E1122 Type 2 diabetes mellitus with diabetic chronic kidney disease: Secondary | ICD-10-CM | POA: Diagnosis not present

## 2021-03-13 DIAGNOSIS — D649 Anemia, unspecified: Secondary | ICD-10-CM | POA: Diagnosis not present

## 2021-03-13 DIAGNOSIS — N183 Chronic kidney disease, stage 3 unspecified: Secondary | ICD-10-CM | POA: Diagnosis not present

## 2021-03-13 DIAGNOSIS — I890 Lymphedema, not elsewhere classified: Secondary | ICD-10-CM | POA: Diagnosis not present

## 2021-03-13 DIAGNOSIS — M1712 Unilateral primary osteoarthritis, left knee: Secondary | ICD-10-CM | POA: Diagnosis not present

## 2021-03-13 DIAGNOSIS — M1711 Unilateral primary osteoarthritis, right knee: Secondary | ICD-10-CM | POA: Diagnosis not present

## 2021-03-13 DIAGNOSIS — E1122 Type 2 diabetes mellitus with diabetic chronic kidney disease: Secondary | ICD-10-CM

## 2021-03-13 DIAGNOSIS — B351 Tinea unguium: Secondary | ICD-10-CM | POA: Diagnosis not present

## 2021-03-13 DIAGNOSIS — N1831 Chronic kidney disease, stage 3a: Secondary | ICD-10-CM

## 2021-03-13 DIAGNOSIS — I129 Hypertensive chronic kidney disease with stage 1 through stage 4 chronic kidney disease, or unspecified chronic kidney disease: Secondary | ICD-10-CM | POA: Diagnosis not present

## 2021-03-13 DIAGNOSIS — E0840 Diabetes mellitus due to underlying condition with diabetic neuropathy, unspecified: Secondary | ICD-10-CM | POA: Diagnosis not present

## 2021-03-13 DIAGNOSIS — L97511 Non-pressure chronic ulcer of other part of right foot limited to breakdown of skin: Secondary | ICD-10-CM | POA: Diagnosis not present

## 2021-03-15 DIAGNOSIS — E0840 Diabetes mellitus due to underlying condition with diabetic neuropathy, unspecified: Secondary | ICD-10-CM | POA: Diagnosis not present

## 2021-03-15 DIAGNOSIS — I890 Lymphedema, not elsewhere classified: Secondary | ICD-10-CM | POA: Diagnosis not present

## 2021-03-15 DIAGNOSIS — I129 Hypertensive chronic kidney disease with stage 1 through stage 4 chronic kidney disease, or unspecified chronic kidney disease: Secondary | ICD-10-CM | POA: Diagnosis not present

## 2021-03-15 DIAGNOSIS — B351 Tinea unguium: Secondary | ICD-10-CM | POA: Diagnosis not present

## 2021-03-15 DIAGNOSIS — D649 Anemia, unspecified: Secondary | ICD-10-CM | POA: Diagnosis not present

## 2021-03-15 DIAGNOSIS — E1122 Type 2 diabetes mellitus with diabetic chronic kidney disease: Secondary | ICD-10-CM | POA: Diagnosis not present

## 2021-03-15 DIAGNOSIS — M1711 Unilateral primary osteoarthritis, right knee: Secondary | ICD-10-CM | POA: Diagnosis not present

## 2021-03-15 DIAGNOSIS — M1712 Unilateral primary osteoarthritis, left knee: Secondary | ICD-10-CM | POA: Diagnosis not present

## 2021-03-15 DIAGNOSIS — L97511 Non-pressure chronic ulcer of other part of right foot limited to breakdown of skin: Secondary | ICD-10-CM | POA: Diagnosis not present

## 2021-03-15 DIAGNOSIS — N183 Chronic kidney disease, stage 3 unspecified: Secondary | ICD-10-CM | POA: Diagnosis not present

## 2021-03-20 DIAGNOSIS — M1712 Unilateral primary osteoarthritis, left knee: Secondary | ICD-10-CM | POA: Diagnosis not present

## 2021-03-20 DIAGNOSIS — N183 Chronic kidney disease, stage 3 unspecified: Secondary | ICD-10-CM | POA: Diagnosis not present

## 2021-03-20 DIAGNOSIS — E0840 Diabetes mellitus due to underlying condition with diabetic neuropathy, unspecified: Secondary | ICD-10-CM | POA: Diagnosis not present

## 2021-03-20 DIAGNOSIS — B351 Tinea unguium: Secondary | ICD-10-CM | POA: Diagnosis not present

## 2021-03-20 DIAGNOSIS — I129 Hypertensive chronic kidney disease with stage 1 through stage 4 chronic kidney disease, or unspecified chronic kidney disease: Secondary | ICD-10-CM | POA: Diagnosis not present

## 2021-03-20 DIAGNOSIS — L97511 Non-pressure chronic ulcer of other part of right foot limited to breakdown of skin: Secondary | ICD-10-CM | POA: Diagnosis not present

## 2021-03-20 DIAGNOSIS — M1711 Unilateral primary osteoarthritis, right knee: Secondary | ICD-10-CM | POA: Diagnosis not present

## 2021-03-20 DIAGNOSIS — I890 Lymphedema, not elsewhere classified: Secondary | ICD-10-CM | POA: Diagnosis not present

## 2021-03-20 DIAGNOSIS — E1122 Type 2 diabetes mellitus with diabetic chronic kidney disease: Secondary | ICD-10-CM | POA: Diagnosis not present

## 2021-03-20 DIAGNOSIS — D649 Anemia, unspecified: Secondary | ICD-10-CM | POA: Diagnosis not present

## 2021-03-21 DIAGNOSIS — I890 Lymphedema, not elsewhere classified: Secondary | ICD-10-CM | POA: Diagnosis not present

## 2021-03-21 DIAGNOSIS — B351 Tinea unguium: Secondary | ICD-10-CM | POA: Diagnosis not present

## 2021-03-21 DIAGNOSIS — L97511 Non-pressure chronic ulcer of other part of right foot limited to breakdown of skin: Secondary | ICD-10-CM | POA: Diagnosis not present

## 2021-03-21 DIAGNOSIS — I129 Hypertensive chronic kidney disease with stage 1 through stage 4 chronic kidney disease, or unspecified chronic kidney disease: Secondary | ICD-10-CM | POA: Diagnosis not present

## 2021-03-21 DIAGNOSIS — E0840 Diabetes mellitus due to underlying condition with diabetic neuropathy, unspecified: Secondary | ICD-10-CM | POA: Diagnosis not present

## 2021-03-21 DIAGNOSIS — N183 Chronic kidney disease, stage 3 unspecified: Secondary | ICD-10-CM | POA: Diagnosis not present

## 2021-03-21 DIAGNOSIS — D649 Anemia, unspecified: Secondary | ICD-10-CM | POA: Diagnosis not present

## 2021-03-21 DIAGNOSIS — M1712 Unilateral primary osteoarthritis, left knee: Secondary | ICD-10-CM | POA: Diagnosis not present

## 2021-03-21 DIAGNOSIS — E1122 Type 2 diabetes mellitus with diabetic chronic kidney disease: Secondary | ICD-10-CM | POA: Diagnosis not present

## 2021-03-21 DIAGNOSIS — M1711 Unilateral primary osteoarthritis, right knee: Secondary | ICD-10-CM | POA: Diagnosis not present

## 2021-03-22 DIAGNOSIS — M1712 Unilateral primary osteoarthritis, left knee: Secondary | ICD-10-CM | POA: Diagnosis not present

## 2021-03-22 DIAGNOSIS — N183 Chronic kidney disease, stage 3 unspecified: Secondary | ICD-10-CM | POA: Diagnosis not present

## 2021-03-22 DIAGNOSIS — M1711 Unilateral primary osteoarthritis, right knee: Secondary | ICD-10-CM | POA: Diagnosis not present

## 2021-03-22 DIAGNOSIS — L97511 Non-pressure chronic ulcer of other part of right foot limited to breakdown of skin: Secondary | ICD-10-CM | POA: Diagnosis not present

## 2021-03-22 DIAGNOSIS — I890 Lymphedema, not elsewhere classified: Secondary | ICD-10-CM | POA: Diagnosis not present

## 2021-03-22 DIAGNOSIS — D649 Anemia, unspecified: Secondary | ICD-10-CM | POA: Diagnosis not present

## 2021-03-22 DIAGNOSIS — I129 Hypertensive chronic kidney disease with stage 1 through stage 4 chronic kidney disease, or unspecified chronic kidney disease: Secondary | ICD-10-CM | POA: Diagnosis not present

## 2021-03-22 DIAGNOSIS — B351 Tinea unguium: Secondary | ICD-10-CM | POA: Diagnosis not present

## 2021-03-22 DIAGNOSIS — E1122 Type 2 diabetes mellitus with diabetic chronic kidney disease: Secondary | ICD-10-CM | POA: Diagnosis not present

## 2021-03-22 DIAGNOSIS — E0840 Diabetes mellitus due to underlying condition with diabetic neuropathy, unspecified: Secondary | ICD-10-CM | POA: Diagnosis not present

## 2021-03-23 DIAGNOSIS — E1122 Type 2 diabetes mellitus with diabetic chronic kidney disease: Secondary | ICD-10-CM | POA: Diagnosis not present

## 2021-03-23 DIAGNOSIS — Z96659 Presence of unspecified artificial knee joint: Secondary | ICD-10-CM | POA: Diagnosis not present

## 2021-03-23 DIAGNOSIS — M170 Bilateral primary osteoarthritis of knee: Secondary | ICD-10-CM | POA: Diagnosis not present

## 2021-03-23 DIAGNOSIS — I129 Hypertensive chronic kidney disease with stage 1 through stage 4 chronic kidney disease, or unspecified chronic kidney disease: Secondary | ICD-10-CM | POA: Diagnosis not present

## 2021-03-26 ENCOUNTER — Ambulatory Visit: Payer: Medicare Other | Admitting: Internal Medicine

## 2021-03-27 DIAGNOSIS — E0840 Diabetes mellitus due to underlying condition with diabetic neuropathy, unspecified: Secondary | ICD-10-CM | POA: Diagnosis not present

## 2021-03-27 DIAGNOSIS — D649 Anemia, unspecified: Secondary | ICD-10-CM | POA: Diagnosis not present

## 2021-03-27 DIAGNOSIS — L97511 Non-pressure chronic ulcer of other part of right foot limited to breakdown of skin: Secondary | ICD-10-CM | POA: Diagnosis not present

## 2021-03-27 DIAGNOSIS — I890 Lymphedema, not elsewhere classified: Secondary | ICD-10-CM | POA: Diagnosis not present

## 2021-03-27 DIAGNOSIS — I129 Hypertensive chronic kidney disease with stage 1 through stage 4 chronic kidney disease, or unspecified chronic kidney disease: Secondary | ICD-10-CM | POA: Diagnosis not present

## 2021-03-27 DIAGNOSIS — N183 Chronic kidney disease, stage 3 unspecified: Secondary | ICD-10-CM | POA: Diagnosis not present

## 2021-03-27 DIAGNOSIS — E1122 Type 2 diabetes mellitus with diabetic chronic kidney disease: Secondary | ICD-10-CM | POA: Diagnosis not present

## 2021-03-27 DIAGNOSIS — B351 Tinea unguium: Secondary | ICD-10-CM | POA: Diagnosis not present

## 2021-03-27 DIAGNOSIS — M1711 Unilateral primary osteoarthritis, right knee: Secondary | ICD-10-CM | POA: Diagnosis not present

## 2021-03-27 DIAGNOSIS — M1712 Unilateral primary osteoarthritis, left knee: Secondary | ICD-10-CM | POA: Diagnosis not present

## 2021-03-30 DIAGNOSIS — B351 Tinea unguium: Secondary | ICD-10-CM | POA: Diagnosis not present

## 2021-03-30 DIAGNOSIS — E0840 Diabetes mellitus due to underlying condition with diabetic neuropathy, unspecified: Secondary | ICD-10-CM | POA: Diagnosis not present

## 2021-03-30 DIAGNOSIS — N183 Chronic kidney disease, stage 3 unspecified: Secondary | ICD-10-CM | POA: Diagnosis not present

## 2021-03-30 DIAGNOSIS — M1712 Unilateral primary osteoarthritis, left knee: Secondary | ICD-10-CM | POA: Diagnosis not present

## 2021-03-30 DIAGNOSIS — E1122 Type 2 diabetes mellitus with diabetic chronic kidney disease: Secondary | ICD-10-CM | POA: Diagnosis not present

## 2021-03-30 DIAGNOSIS — I890 Lymphedema, not elsewhere classified: Secondary | ICD-10-CM | POA: Diagnosis not present

## 2021-03-30 DIAGNOSIS — D649 Anemia, unspecified: Secondary | ICD-10-CM | POA: Diagnosis not present

## 2021-03-30 DIAGNOSIS — I129 Hypertensive chronic kidney disease with stage 1 through stage 4 chronic kidney disease, or unspecified chronic kidney disease: Secondary | ICD-10-CM | POA: Diagnosis not present

## 2021-03-30 DIAGNOSIS — M1711 Unilateral primary osteoarthritis, right knee: Secondary | ICD-10-CM | POA: Diagnosis not present

## 2021-03-30 DIAGNOSIS — L97511 Non-pressure chronic ulcer of other part of right foot limited to breakdown of skin: Secondary | ICD-10-CM | POA: Diagnosis not present

## 2021-04-03 DIAGNOSIS — M25512 Pain in left shoulder: Secondary | ICD-10-CM | POA: Diagnosis not present

## 2021-04-03 DIAGNOSIS — M150 Primary generalized (osteo)arthritis: Secondary | ICD-10-CM | POA: Diagnosis not present

## 2021-04-03 DIAGNOSIS — M25561 Pain in right knee: Secondary | ICD-10-CM | POA: Diagnosis not present

## 2021-04-03 DIAGNOSIS — G894 Chronic pain syndrome: Secondary | ICD-10-CM | POA: Diagnosis not present

## 2021-04-04 ENCOUNTER — Ambulatory Visit (INDEPENDENT_AMBULATORY_CARE_PROVIDER_SITE_OTHER): Payer: Medicare Other

## 2021-04-04 DIAGNOSIS — I129 Hypertensive chronic kidney disease with stage 1 through stage 4 chronic kidney disease, or unspecified chronic kidney disease: Secondary | ICD-10-CM

## 2021-04-04 DIAGNOSIS — N183 Chronic kidney disease, stage 3 unspecified: Secondary | ICD-10-CM

## 2021-04-04 DIAGNOSIS — E1122 Type 2 diabetes mellitus with diabetic chronic kidney disease: Secondary | ICD-10-CM

## 2021-04-05 NOTE — Chronic Care Management (AMB) (Signed)
?Chronic Care Management  ? ? Social Work Note ? ?04/05/2021 ?Name: ARORA COAKLEY MRN: [id number] DOB: April 08, 1945 ? ?Mary [name] is a 76 y.o. year old female who is a primary care patient of Glendale Chard, MD. The CCM team was consulted to assist the patient with chronic disease management and/or care coordination needs related to:  DM, CKD III .  ? ?Engaged with patient by telephone for follow up visit in response to provider referral for social work chronic care management and care coordination services.  ? ?Consent to Services:  ?The patient was given information about Chronic Care Management services, agreed to services, and gave verbal consent prior to initiation of services.  Please see initial visit note for detailed documentation.  ? ?Patient agreed to services and consent obtained.  ? ?Assessment: Review of patient past medical history, allergies, medications, and health status, including review of relevant consultants reports was performed today as part of a comprehensive evaluation and provision of chronic care management and care coordination services.    ? ?SDOH (Social Determinants of Health) assessments and interventions performed:   ? ?Advanced Directives Status: Not addressed in this encounter. ? ?CCM Care Plan ? ?No Known Allergies ? ?Outpatient Encounter Medications as of 04/04/2021  ?Medication Sig  ? aspirin 81 MG chewable tablet 1 tablet  ? Assure Comfort Lancets 28G MISC   ? Blood Glucose Monitoring Suppl (ONETOUCH VERIO FLEX SYSTEM) w/Device KIT Use as directed to check blood sugars 2 times per day dx: e11.65  ? brimonidine (ALPHAGAN) 0.2 % ophthalmic solution Place 1 drop into both eyes 2 (two) times daily.  ? cetirizine (ZYRTEC) 10 MG tablet Take 1 tablet (10 mg total) by mouth daily.  ? fluticasone (FLONASE) 50 MCG/ACT nasal spray SPRAY 1 SPRAY INTO EACH NOSTRIL EVERY DAY  ? furosemide (LASIX) 40 MG tablet TAKE 1 TABLET BY MOUTH  DAILY  ? glucose blood (ONETOUCH VERIO) test strip Use  as directed to check blood sugars 2 times per day dx: e11.65  ? HYDROcodone-acetaminophen (NORCO) 10-325 MG tablet Take 1 tablet by mouth 5 (five) times daily as needed.  ? levothyroxine (SYNTHROID) 125 MCG tablet Take 125 mcg by mouth daily before breakfast.  ? metoprolol succinate (TOPROL-XL) 50 MG 24 hr tablet TAKE 1 TABLET BY MOUTH  DAILY  ? omeprazole (PRILOSEC) 20 MG capsule TAKE 1 CAPSULE BY MOUTH  DAILY  ? OneTouch Delica Lancets 50Y MISC Use as directed to check blood sugars 2 times per day dx: e11.65  ? pravastatin (PRAVACHOL) 40 MG tablet Take 1 tablet (40 mg total) by mouth daily.  ? telmisartan (MICARDIS) 80 MG tablet TAKE 1 TABLET BY MOUTH  DAILY  ? timolol (BETIMOL) 0.25 % ophthalmic solution Place 1 drop into the right eye 2 (two) times daily.  ? triamcinolone (KENALOG) 0.1 % APPLY TO AFFECTED AREA TWICE A DAY  ? ?No facility-administered encounter medications on file as of 04/04/2021.  ? ? ?Patient Active Problem List  ? Diagnosis Date Noted  ? Change in bowel habit 01/20/2020  ? Chronic anemia 01/20/2020  ? Colon cancer screening 01/20/2020  ? Diabetes mellitus with stage 3 chronic kidney disease ([place]) 02/21/2018  ? 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  ? Primary osteoarthritis of both knees 02/21/2018  ? Class 3 severe obesity due to excess calories with serious comorbidity and body mass index (BMI) of 45.0 to 49.9 in adult [facility]) 02/21/2018  ? Skin ulcer of right foot with fat layer exposed ([place]) 02/18/2018  ? Morbid (  severe) obesity due to excess calories ([place]) 11/18/2017  ? Knee pain 07/16/2015  ? Lateral dislocation of right patella 05/04/2015  ? Rupture of right quadriceps tendon 05/24/2014  ? S/P TKR (total knee replacement) 05/05/2012  ? ? ?Conditions to be addressed/monitored: HTN, DMII, and CKD Stage III ; ADL IADL limitations ? ?Care Plan : Social Work Care Plan  ?Updates made by [name] since 04/05/2021 12:00 AM  ?  ? ?Problem: Care Coordination   ?  ? ?Long-Range Goal:  Identify resources to assist with care coordination needs   ?Start Date: 02/15/2020  ?This Visit's Progress: On track  ?Recent Progress: On track  ?Priority: Low  ?Note:   ?Current Barriers:  ?Limited social support ?Transportation ?ADL IADL limitations ?Chronic conditions including DM II, CKD III, and obesity which put patient at increased risk for hospitalization ? ? ?Social Work Clinical Goal(s):  ?patient will work with SW to address concerns related to care coordination ? ?Interventions: ?1:1 collaboration with Glendale Chard, MD regarding development and update of comprehensive plan of care as evidenced by provider attestation and co-signature ?Inter-disciplinary care team collaboration (see longitudinal plan of care) ?Telephonic visit completed with the patient to assess for care coordination needs ?Discussed the patient continues to do well in her home. Her current caregiver is assisting with lymphedema pumps on her days she visits the patient  ?Determined the patient has been referred to [facility] to assess eye pressure, patient plans to utilize [facility] transportation when her appointment is arranged and reports she has already contacted her health plan to confirm they can transport her to that location ?Scheduled follow up call over the next two months ? ?Patient Goals/Self-Care Activities ? patient will:  ? - Engage with the [facility] in home aid program as needed regarding care plan needs ?-Contact [employer] when needed to arrange trips to [employer] SW as needed prior to next scheduled call ? ?Follow up Plan: SW will follow up with patient by phone over the next two months ? ?  ?  ? ?Follow Up Plan: SW will follow up with patient by phone over the next two months. ?     ?[name], BSW, CDP ?Social Worker, Certified Dementia Practitioner ?[facility] / [place] [facility] ?[phone] ? ?   ? ? ? ? ?

## 2021-04-05 NOTE — Patient Instructions (Signed)
Social Worker Visit Information ? ?Goals we discussed today:  ? Goals Addressed   ? ?  ?  ?  ?  ? This Visit's Progress  ?  work with SW to manage care coordination needs   On track  ?  Timeframe:  Long-Range Goal ?Priority:  Low ?Start Date:  2.1.22                          ?                     ?Next planned outreach: 5.22.23 ? ?Patient Goals/Self-Care Activities ? patient will:  ? - Engage with the [facility] in home aid program as needed regarding care plan needs ?-Contact [employer] when needed to arrange trips to [employer] SW as needed prior to next scheduled call ? ?  ? ?  ?  ? ?Patient verbalizes understanding of instructions and care plan provided today and agrees to view in MyChart. Active MyChart status confirmed with patient.   ? ?Follow Up Plan: SW will follow up with patient by phone over the next two months ? ? ?Bevelyn Ngo, BSW, CDP ?Social Worker, Certified Dementia Practitioner ?[facility] / [facility] Care Management ?[phone] ? ?   ? ?

## 2021-04-06 DIAGNOSIS — I890 Lymphedema, not elsewhere classified: Secondary | ICD-10-CM | POA: Diagnosis not present

## 2021-04-06 DIAGNOSIS — D649 Anemia, unspecified: Secondary | ICD-10-CM | POA: Diagnosis not present

## 2021-04-06 DIAGNOSIS — M1711 Unilateral primary osteoarthritis, right knee: Secondary | ICD-10-CM | POA: Diagnosis not present

## 2021-04-06 DIAGNOSIS — E0840 Diabetes mellitus due to underlying condition with diabetic neuropathy, unspecified: Secondary | ICD-10-CM | POA: Diagnosis not present

## 2021-04-06 DIAGNOSIS — B351 Tinea unguium: Secondary | ICD-10-CM | POA: Diagnosis not present

## 2021-04-06 DIAGNOSIS — E1122 Type 2 diabetes mellitus with diabetic chronic kidney disease: Secondary | ICD-10-CM | POA: Diagnosis not present

## 2021-04-06 DIAGNOSIS — L97511 Non-pressure chronic ulcer of other part of right foot limited to breakdown of skin: Secondary | ICD-10-CM | POA: Diagnosis not present

## 2021-04-06 DIAGNOSIS — I129 Hypertensive chronic kidney disease with stage 1 through stage 4 chronic kidney disease, or unspecified chronic kidney disease: Secondary | ICD-10-CM | POA: Diagnosis not present

## 2021-04-06 DIAGNOSIS — N183 Chronic kidney disease, stage 3 unspecified: Secondary | ICD-10-CM | POA: Diagnosis not present

## 2021-04-06 DIAGNOSIS — M1712 Unilateral primary osteoarthritis, left knee: Secondary | ICD-10-CM | POA: Diagnosis not present

## 2021-04-09 DIAGNOSIS — L97511 Non-pressure chronic ulcer of other part of right foot limited to breakdown of skin: Secondary | ICD-10-CM | POA: Diagnosis not present

## 2021-04-09 DIAGNOSIS — B351 Tinea unguium: Secondary | ICD-10-CM | POA: Diagnosis not present

## 2021-04-09 DIAGNOSIS — M1711 Unilateral primary osteoarthritis, right knee: Secondary | ICD-10-CM | POA: Diagnosis not present

## 2021-04-09 DIAGNOSIS — I129 Hypertensive chronic kidney disease with stage 1 through stage 4 chronic kidney disease, or unspecified chronic kidney disease: Secondary | ICD-10-CM | POA: Diagnosis not present

## 2021-04-09 DIAGNOSIS — M1712 Unilateral primary osteoarthritis, left knee: Secondary | ICD-10-CM | POA: Diagnosis not present

## 2021-04-09 DIAGNOSIS — E1122 Type 2 diabetes mellitus with diabetic chronic kidney disease: Secondary | ICD-10-CM | POA: Diagnosis not present

## 2021-04-09 DIAGNOSIS — N183 Chronic kidney disease, stage 3 unspecified: Secondary | ICD-10-CM | POA: Diagnosis not present

## 2021-04-09 DIAGNOSIS — E0840 Diabetes mellitus due to underlying condition with diabetic neuropathy, unspecified: Secondary | ICD-10-CM | POA: Diagnosis not present

## 2021-04-09 DIAGNOSIS — I890 Lymphedema, not elsewhere classified: Secondary | ICD-10-CM | POA: Diagnosis not present

## 2021-04-09 DIAGNOSIS — D649 Anemia, unspecified: Secondary | ICD-10-CM | POA: Diagnosis not present

## 2021-04-12 ENCOUNTER — Encounter (INDEPENDENT_AMBULATORY_CARE_PROVIDER_SITE_OTHER): Payer: Medicare Other | Admitting: Ophthalmology

## 2021-04-13 ENCOUNTER — Encounter (INDEPENDENT_AMBULATORY_CARE_PROVIDER_SITE_OTHER): Payer: Medicare Other | Admitting: Ophthalmology

## 2021-04-13 DIAGNOSIS — M1712 Unilateral primary osteoarthritis, left knee: Secondary | ICD-10-CM | POA: Diagnosis not present

## 2021-04-13 DIAGNOSIS — I890 Lymphedema, not elsewhere classified: Secondary | ICD-10-CM | POA: Diagnosis not present

## 2021-04-13 DIAGNOSIS — I129 Hypertensive chronic kidney disease with stage 1 through stage 4 chronic kidney disease, or unspecified chronic kidney disease: Secondary | ICD-10-CM | POA: Diagnosis not present

## 2021-04-13 DIAGNOSIS — N1831 Chronic kidney disease, stage 3a: Secondary | ICD-10-CM

## 2021-04-13 DIAGNOSIS — D649 Anemia, unspecified: Secondary | ICD-10-CM | POA: Diagnosis not present

## 2021-04-13 DIAGNOSIS — L97511 Non-pressure chronic ulcer of other part of right foot limited to breakdown of skin: Secondary | ICD-10-CM | POA: Diagnosis not present

## 2021-04-13 DIAGNOSIS — E0840 Diabetes mellitus due to underlying condition with diabetic neuropathy, unspecified: Secondary | ICD-10-CM | POA: Diagnosis not present

## 2021-04-13 DIAGNOSIS — M1711 Unilateral primary osteoarthritis, right knee: Secondary | ICD-10-CM | POA: Diagnosis not present

## 2021-04-13 DIAGNOSIS — N183 Chronic kidney disease, stage 3 unspecified: Secondary | ICD-10-CM

## 2021-04-13 DIAGNOSIS — E1122 Type 2 diabetes mellitus with diabetic chronic kidney disease: Secondary | ICD-10-CM | POA: Diagnosis not present

## 2021-04-13 DIAGNOSIS — B351 Tinea unguium: Secondary | ICD-10-CM | POA: Diagnosis not present

## 2021-04-16 DIAGNOSIS — M1711 Unilateral primary osteoarthritis, right knee: Secondary | ICD-10-CM | POA: Diagnosis not present

## 2021-04-16 DIAGNOSIS — N183 Chronic kidney disease, stage 3 unspecified: Secondary | ICD-10-CM | POA: Diagnosis not present

## 2021-04-16 DIAGNOSIS — E1122 Type 2 diabetes mellitus with diabetic chronic kidney disease: Secondary | ICD-10-CM | POA: Diagnosis not present

## 2021-04-16 DIAGNOSIS — E0840 Diabetes mellitus due to underlying condition with diabetic neuropathy, unspecified: Secondary | ICD-10-CM | POA: Diagnosis not present

## 2021-04-16 DIAGNOSIS — D649 Anemia, unspecified: Secondary | ICD-10-CM | POA: Diagnosis not present

## 2021-04-16 DIAGNOSIS — I129 Hypertensive chronic kidney disease with stage 1 through stage 4 chronic kidney disease, or unspecified chronic kidney disease: Secondary | ICD-10-CM | POA: Diagnosis not present

## 2021-04-16 DIAGNOSIS — M1712 Unilateral primary osteoarthritis, left knee: Secondary | ICD-10-CM | POA: Diagnosis not present

## 2021-04-16 DIAGNOSIS — L97511 Non-pressure chronic ulcer of other part of right foot limited to breakdown of skin: Secondary | ICD-10-CM | POA: Diagnosis not present

## 2021-04-16 DIAGNOSIS — B351 Tinea unguium: Secondary | ICD-10-CM | POA: Diagnosis not present

## 2021-04-16 DIAGNOSIS — I890 Lymphedema, not elsewhere classified: Secondary | ICD-10-CM | POA: Diagnosis not present

## 2021-04-16 NOTE — Progress Notes (Signed)
?[facility] Note ? ?04/20/2021 ? ?  ? ?CHIEF COMPLAINT ?Patient presents for Retina Follow Up ? ? ? ?HISTORY OF PRESENT ILLNESS: ?Mary [name] is a 76 y.o. female who presents to the clinic today for:  ? ?HPI   ? ? 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  This started 8.5 weeks ago.  I, the attending physician,  performed the HPI with the patient and updated documentation appropriately. ? ?  ?  ? ? Comments   ?Patient here for 8.5 weeks retina follow up fpr CRVO OU. Patient states vision terrible. Has glaucoma OD. Was to go to [facility] but doesn't have transportation. Maybe can find another doctor? No eye pain.  ? ?  ?  ?Last edited by Bernarda Caffey, MD on 04/20/2021  3:26 PM.  ?  ? ?Pt states Dr. Lucianne Lei wanted her to go to [facility] for glaucoma, but pt does not have transportation there, pt states she has not seen Dr. Lucianne Lei yet and cannot get an appt until September ? ?Referring physician: ?Glendale Chard, MD ?[address] ?[address] ?[place],  [place] [address] ? ?HISTORICAL INFORMATION:  ? ?Selected notes from the [facility] ?Transferred care from Dr. Zigmund Daniel  ? ?CURRENT MEDICATIONS: ?Current Outpatient Medications (Ophthalmic Drugs)  ?Medication Sig  ? brimonidine (ALPHAGAN) 0.2 % ophthalmic solution Place 1 drop into both eyes 2 (two) times daily.  ? timolol (BETIMOL) 0.25 % ophthalmic solution Place 1 drop into the right eye 2 (two) times daily.  ? ?No current facility-administered medications for this visit. (Ophthalmic Drugs)  ? ?Current Outpatient Medications (Other)  ?Medication Sig  ? aspirin 81 MG chewable tablet 1 tablet  ? Assure Comfort Lancets 28G MISC   ? Blood Glucose Monitoring Suppl (ONETOUCH VERIO FLEX SYSTEM) w/Device KIT Use as directed to check blood sugars 2 times per day dx: e11.65  ? cetirizine (ZYRTEC) 10 MG tablet Take 1 tablet (10 mg total) by mouth daily.  ? fluticasone (FLONASE) 50 MCG/ACT nasal spray SPRAY 1 SPRAY INTO EACH  NOSTRIL EVERY DAY  ? furosemide (LASIX) 40 MG tablet TAKE 1 TABLET BY MOUTH  DAILY  ? glucose blood (ONETOUCH VERIO) test strip Use as directed to check blood sugars 2 times per day dx: e11.65  ? HYDROcodone-acetaminophen (NORCO) 10-325 MG tablet Take 1 tablet by mouth 5 (five) times daily as needed.  ? levothyroxine (SYNTHROID) 125 MCG tablet Take 125 mcg by mouth daily before breakfast.  ? metoprolol succinate (TOPROL-XL) 50 MG 24 hr tablet TAKE 1 TABLET BY MOUTH  DAILY  ? omeprazole (PRILOSEC) 20 MG capsule TAKE 1 CAPSULE BY MOUTH  DAILY  ? OneTouch Delica Lancets 93O MISC Use as directed to check blood sugars 2 times per day dx: e11.65  ? pravastatin (PRAVACHOL) 40 MG tablet Take 1 tablet (40 mg total) by mouth daily.  ? telmisartan (MICARDIS) 80 MG tablet TAKE 1 TABLET BY MOUTH  DAILY  ? triamcinolone (KENALOG) 0.1 % APPLY TO AFFECTED AREA TWICE A DAY  ? ?No current facility-administered medications for this visit. (Other)  ? ?REVIEW OF SYSTEMS: ?ROS   ?Positive for: Genitourinary, Musculoskeletal, Endocrine, Eyes ?Negative for: Constitutional, Gastrointestinal, Neurological, Skin, HENT, Cardiovascular, Respiratory, Psychiatric, Allergic/Imm, Heme/Lymph ?Last edited by Theodore Demark, COA on 04/20/2021  8:43 AM.  ?  ? ?ALLERGIES ?No Known Allergies ? ?PAST MEDICAL HISTORY ?Past Medical History:  ?Diagnosis Date  ? Arthritis   ? osteoarthritis  ?  Cataract   ? OS  ? Diabetes mellitus   ? Insulin dependent  ? Diabetic retinopathy ([place])   ? NPDR OU  ? Glaucoma   ? POAG OD  ? H/O cardiovascular stress test   ? pt. reports 10/13- was normal per pt.   ? Hyperlipemia   ? Hypertension   ? Hypertensive retinopathy   ? OU  ? ?Past Surgical History:  ?Procedure Laterality Date  ? CARPAL TUNNEL RELEASE Bilateral   ? CATARACT EXTRACTION Right 10/05/2018  ? Dr. Kathlen Mody  ? EYE SURGERY Right   ? Cat Sx  ? KNEE ARTHROSCOPY    ? right  ? QUADRICEPS TENDON REPAIR  02/17/2012  ? Procedure: REPAIR QUADRICEP TENDON;  Surgeon: Vickey Huger, MD;  Location: [facility];  Service: Orthopedics;  Laterality: Right;  right quadriceps repair with latera release  ? QUADRICEPS TENDON REPAIR Right 05/15/2012  ? Procedure: REPAIR QUADRICEP TENDON;  Surgeon: Vickey Huger, MD;  Location: [facility];  Service: Orthopedics;  Laterality: Right;  ? TOTAL KNEE ARTHROPLASTY  10/21/2011  ? Procedure: TOTAL KNEE ARTHROPLASTY;  Surgeon: Rudean Haskell, MD;  Location: [facility];  Service: Orthopedics;  Laterality: Right;  ? TUBAL LIGATION    ? ?FAMILY HISTORY ?Family History  ?Problem Relation Age of Onset  ? Hypertension Mother   ? Heart Problems Father   ? Gout Father   ? Arthritis Father   ? ?SOCIAL HISTORY ?Social History  ? ?Tobacco Use  ? Smoking status: Former  ?  Packs/day: 1.00  ?  Years: 30.00  ?  Pack years: 30.00  ?  Types: Cigarettes  ?  Start date: 01/15/1967  ?  Quit date: 06/28/2014  ?  Years since quitting: 6.8  ? Smokeless tobacco: Never  ?Vaping Use  ? Vaping Use: Never used  ?Substance Use Topics  ? Alcohol use: No  ? Drug use: Yes  ?  Types: Hydrocodone  ?  ? ?  ?OPHTHALMIC EXAM: ?Base Eye Exam   ? ? Visual Acuity (Snellen - Linear)   ? ?   Right Left  ? Dist Bear 20/60 HM  ? Dist ph  NI NI  ?Didn't bring glasses. ? ?  ?  ? ? Tonometry (Tonopen, 8:39 AM)   ? ?   Right Left  ? Pressure 09 09  ? ?  ?  ? ? Pupils   ? ?   Dark Light Shape React APD  ? Right 3 2 Round Brisk None  ? Left 2 2 Round Minimal None  ? ?  ?  ? ? Visual Fields (Counting fingers)   ? ?   Left Right  ?   Full  ? Restrictions Total superior temporal, inferior temporal, superior nasal, inferior nasal deficiencies   ? ?  ?  ? ? Extraocular Movement   ? ?   Right Left  ?  Full, Ortho Full, Ortho  ? ?  ?  ? ? Neuro/Psych   ? ? Oriented x3: Yes  ? Mood/Affect: Normal  ? ?  ?  ? ? Dilation   ? ? Both eyes: 1.0% Mydriacyl, 2.5% Phenylephrine @ 8:39 AM  ? ?  ?  ? ?  ? ?Slit Lamp and Fundus Exam   ? ? Slit Lamp Exam   ? ?   Right Left  ? Lids/Lashes Dermatochalasis - upper lid, mild Meibomian gland  dysfunction Dermatochalasis - upper lid, mild Meibomian gland dysfunction  ? Conjunctiva/Sclera Melanosis Melanosis  ? Cornea 2-3+  Punctate epithelial erosions, well healed temporal cataract wounds 3+ Punctate epithelial erosions, well healed temporal cataract wounds  ? Anterior Chamber Deep and quiet Deep and quiet  ? Iris Round and moderately dilated to 5.63m Round and moderately dilated to 664m ? Lens PC IOL in good position, trace, cortical remnant temporally, PC folds 2-3+ Nuclear sclerosis, 2-3+ Cortical cataract  ? Anterior Vitreous Vitreous syneresis, vitreous condensations, Ozurdex pellet remnants inferiorly, no residual IVT Vitreous syneresis  ? ?  ?  ? ? Fundus Exam   ? ?   Right Left  ? Disc 2-3+Pallor, Sharp rim, temporal PPP/PPA, thin superior rim, +cupping Mild Pallor, Sharp rim, PPP, severely attenuated vessels  ? C/D Ratio 0.7 0.3  ? Macula Blunted foveal reflex, Drusen, Retinal pigment epithelial mottling, temporal cystic changes with exudates -- improved Flat, Blunted foveal reflex, RPE mottling and clumping, scarring, atrophy, no heme  ? Vessels attenuated, Tortuous Severe Vascular attenuation, +fibrosis  ? Periphery Attached, focal IRH temporal periphery--improved Attached, pavingstone and reticular degeneration inferiorly, scattered RPE atrophy, greatest peripapillary    ? ?  ?  ? ?  ? ?Refraction   ? ? Wearing Rx   ? ?   Sphere Cylinder Axis Add  ? Right -1.00 +0.75 180 +2.25  ? Left -0.50 +0.75 155 +2.25  ? ?  ?  ? ?  ? ?IMAGING AND PROCEDURES  ?Imaging and Procedures for @TODAY @ ? ?OCT, Retina - OU - Both Eyes   ? ?   ?Right Eye ?Quality was good. Central Foveal Thickness: 179. Progression has improved. Findings include abnormal foveal contour, intraretinal hyper-reflective material, no SRF, vitreomacular adhesion , no IRF (Stable improvement in temporal cystic changes / IRF, trace vitreous opacities).  ? ?Left Eye ?Quality was good. Central Foveal Thickness: 255. Progression has been  stable. Findings include abnormal foveal contour, no IRF, no SRF, outer retinal atrophy, epiretinal membrane, preretinal fibrosis, subretinal hyper-reflective material, pigment epithelial detachment (Stable from prior)

## 2021-04-19 DIAGNOSIS — I890 Lymphedema, not elsewhere classified: Secondary | ICD-10-CM | POA: Diagnosis not present

## 2021-04-19 DIAGNOSIS — M1711 Unilateral primary osteoarthritis, right knee: Secondary | ICD-10-CM | POA: Diagnosis not present

## 2021-04-19 DIAGNOSIS — L97511 Non-pressure chronic ulcer of other part of right foot limited to breakdown of skin: Secondary | ICD-10-CM | POA: Diagnosis not present

## 2021-04-19 DIAGNOSIS — E1122 Type 2 diabetes mellitus with diabetic chronic kidney disease: Secondary | ICD-10-CM | POA: Diagnosis not present

## 2021-04-19 DIAGNOSIS — I129 Hypertensive chronic kidney disease with stage 1 through stage 4 chronic kidney disease, or unspecified chronic kidney disease: Secondary | ICD-10-CM | POA: Diagnosis not present

## 2021-04-19 DIAGNOSIS — B351 Tinea unguium: Secondary | ICD-10-CM | POA: Diagnosis not present

## 2021-04-19 DIAGNOSIS — E0840 Diabetes mellitus due to underlying condition with diabetic neuropathy, unspecified: Secondary | ICD-10-CM | POA: Diagnosis not present

## 2021-04-19 DIAGNOSIS — N183 Chronic kidney disease, stage 3 unspecified: Secondary | ICD-10-CM | POA: Diagnosis not present

## 2021-04-19 DIAGNOSIS — M1712 Unilateral primary osteoarthritis, left knee: Secondary | ICD-10-CM | POA: Diagnosis not present

## 2021-04-19 DIAGNOSIS — D649 Anemia, unspecified: Secondary | ICD-10-CM | POA: Diagnosis not present

## 2021-04-20 ENCOUNTER — Encounter (INDEPENDENT_AMBULATORY_CARE_PROVIDER_SITE_OTHER): Payer: Self-pay | Admitting: Ophthalmology

## 2021-04-20 ENCOUNTER — Ambulatory Visit (INDEPENDENT_AMBULATORY_CARE_PROVIDER_SITE_OTHER): Payer: Medicare Other | Admitting: Ophthalmology

## 2021-04-20 DIAGNOSIS — E113413 Type 2 diabetes mellitus with severe nonproliferative diabetic retinopathy with macular edema, bilateral: Secondary | ICD-10-CM

## 2021-04-20 DIAGNOSIS — H34813 Central retinal vein occlusion, bilateral, with macular edema: Secondary | ICD-10-CM

## 2021-04-20 DIAGNOSIS — H401111 Primary open-angle glaucoma, right eye, mild stage: Secondary | ICD-10-CM | POA: Diagnosis not present

## 2021-04-20 DIAGNOSIS — H25812 Combined forms of age-related cataract, left eye: Secondary | ICD-10-CM | POA: Diagnosis not present

## 2021-04-20 DIAGNOSIS — Z961 Presence of intraocular lens: Secondary | ICD-10-CM

## 2021-04-20 DIAGNOSIS — I10 Essential (primary) hypertension: Secondary | ICD-10-CM | POA: Diagnosis not present

## 2021-04-20 DIAGNOSIS — H35033 Hypertensive retinopathy, bilateral: Secondary | ICD-10-CM | POA: Diagnosis not present

## 2021-04-20 MED ORDER — TRIAMCINOLONE ACETONIDE 40 MG/ML IJ SUSP FOR KALEIDOSCOPE
4.0000 mg | INTRAMUSCULAR | Status: AC | PRN
  Administered 2021-04-20: 4 mg via INTRAVITREAL

## 2021-04-23 DIAGNOSIS — Z96659 Presence of unspecified artificial knee joint: Secondary | ICD-10-CM | POA: Diagnosis not present

## 2021-04-23 DIAGNOSIS — E1122 Type 2 diabetes mellitus with diabetic chronic kidney disease: Secondary | ICD-10-CM | POA: Diagnosis not present

## 2021-04-23 DIAGNOSIS — I129 Hypertensive chronic kidney disease with stage 1 through stage 4 chronic kidney disease, or unspecified chronic kidney disease: Secondary | ICD-10-CM | POA: Diagnosis not present

## 2021-04-23 DIAGNOSIS — M170 Bilateral primary osteoarthritis of knee: Secondary | ICD-10-CM | POA: Diagnosis not present

## 2021-04-24 DIAGNOSIS — L97511 Non-pressure chronic ulcer of other part of right foot limited to breakdown of skin: Secondary | ICD-10-CM | POA: Diagnosis not present

## 2021-04-24 DIAGNOSIS — E1122 Type 2 diabetes mellitus with diabetic chronic kidney disease: Secondary | ICD-10-CM | POA: Diagnosis not present

## 2021-04-24 DIAGNOSIS — I890 Lymphedema, not elsewhere classified: Secondary | ICD-10-CM | POA: Diagnosis not present

## 2021-04-24 DIAGNOSIS — B351 Tinea unguium: Secondary | ICD-10-CM | POA: Diagnosis not present

## 2021-04-24 DIAGNOSIS — M1712 Unilateral primary osteoarthritis, left knee: Secondary | ICD-10-CM | POA: Diagnosis not present

## 2021-04-24 DIAGNOSIS — N183 Chronic kidney disease, stage 3 unspecified: Secondary | ICD-10-CM | POA: Diagnosis not present

## 2021-04-24 DIAGNOSIS — I129 Hypertensive chronic kidney disease with stage 1 through stage 4 chronic kidney disease, or unspecified chronic kidney disease: Secondary | ICD-10-CM | POA: Diagnosis not present

## 2021-04-24 DIAGNOSIS — E0840 Diabetes mellitus due to underlying condition with diabetic neuropathy, unspecified: Secondary | ICD-10-CM | POA: Diagnosis not present

## 2021-04-24 DIAGNOSIS — M1711 Unilateral primary osteoarthritis, right knee: Secondary | ICD-10-CM | POA: Diagnosis not present

## 2021-04-24 DIAGNOSIS — D649 Anemia, unspecified: Secondary | ICD-10-CM | POA: Diagnosis not present

## 2021-04-26 ENCOUNTER — Encounter: Payer: Medicare Other | Admitting: Internal Medicine

## 2021-04-26 DIAGNOSIS — I129 Hypertensive chronic kidney disease with stage 1 through stage 4 chronic kidney disease, or unspecified chronic kidney disease: Secondary | ICD-10-CM | POA: Diagnosis not present

## 2021-04-26 DIAGNOSIS — M1712 Unilateral primary osteoarthritis, left knee: Secondary | ICD-10-CM | POA: Diagnosis not present

## 2021-04-26 DIAGNOSIS — M1711 Unilateral primary osteoarthritis, right knee: Secondary | ICD-10-CM | POA: Diagnosis not present

## 2021-04-26 DIAGNOSIS — L97511 Non-pressure chronic ulcer of other part of right foot limited to breakdown of skin: Secondary | ICD-10-CM | POA: Diagnosis not present

## 2021-04-26 DIAGNOSIS — D649 Anemia, unspecified: Secondary | ICD-10-CM | POA: Diagnosis not present

## 2021-04-26 DIAGNOSIS — E1122 Type 2 diabetes mellitus with diabetic chronic kidney disease: Secondary | ICD-10-CM | POA: Diagnosis not present

## 2021-04-26 DIAGNOSIS — N183 Chronic kidney disease, stage 3 unspecified: Secondary | ICD-10-CM | POA: Diagnosis not present

## 2021-04-26 DIAGNOSIS — E0840 Diabetes mellitus due to underlying condition with diabetic neuropathy, unspecified: Secondary | ICD-10-CM | POA: Diagnosis not present

## 2021-04-26 DIAGNOSIS — B351 Tinea unguium: Secondary | ICD-10-CM | POA: Diagnosis not present

## 2021-04-26 DIAGNOSIS — I890 Lymphedema, not elsewhere classified: Secondary | ICD-10-CM | POA: Diagnosis not present

## 2021-04-26 NOTE — Progress Notes (Deleted)
?Rich Brave Llittleton,acting as a Education administrator for Maximino Greenland, MD.,have documented all relevant documentation on the behalf of Maximino Greenland, MD,as directed by  Maximino Greenland, MD while in the presence of Maximino Greenland, MD.  ?This visit occurred during the SARS-CoV-2 public health emergency.  Safety protocols were in place, including screening questions prior to the visit, additional usage of staff PPE, and extensive cleaning of exam room while observing appropriate contact time as indicated for disinfecting solutions. ? ?Subjective:  ?  ? Patient ID: Mary [name] , female    DOB: 09/02/1945 , 76 y.o.   MRN: [id number] ? ? ?No chief complaint on file. ? ? ?HPI ? ?The patient is here today for a diabetes and hypertension follow-up.  She reports taking meds as prescribed. No issues with meds.  ? ?Diabetes ?She presents for her follow-up diabetic visit. She has type 2 diabetes mellitus. Her disease course has been stable. Pertinent negatives for hypoglycemia include no headaches. Pertinent negatives for diabetes include no blurred vision. There are no hypoglycemic complications. Diabetic complications include nephropathy. She is following a diabetic diet.  ?Hypertension ?This is a chronic problem. The current episode started more than 1 year ago. Pertinent negatives include no blurred vision, headaches, orthopnea or palpitations. Risk factors for coronary artery disease include diabetes mellitus, dyslipidemia, post-menopausal state and sedentary lifestyle.   ? ?Past Medical History:  ?Diagnosis Date  ? Arthritis   ? osteoarthritis  ? Cataract   ? OS  ? Diabetes mellitus   ? Insulin dependent  ? Diabetic retinopathy ([place])   ? NPDR OU  ? Glaucoma   ? POAG OD  ? H/O cardiovascular stress test   ? pt. reports 10/13- was normal per pt.   ? Hyperlipemia   ? Hypertension   ? Hypertensive retinopathy   ? OU  ?  ? ?Family History  ?Problem Relation Age of Onset  ? Hypertension Mother   ? Heart Problems Father   ? Gout  Father   ? Arthritis Father   ? ? ? ?Current Outpatient Medications:  ?  aspirin 81 MG chewable tablet, 1 tablet, Disp: , Rfl:  ?  Assure Comfort Lancets 28G MISC, , Disp: , Rfl:  ?  Blood Glucose Monitoring Suppl (ONETOUCH VERIO FLEX SYSTEM) w/Device KIT, Use as directed to check blood sugars 2 times per day dx: e11.65, Disp: 1 kit, Rfl: 1 ?  brimonidine (ALPHAGAN) 0.2 % ophthalmic solution, Place 1 drop into both eyes 2 (two) times daily., Disp: 5 mL, Rfl: 5 ?  cetirizine (ZYRTEC) 10 MG tablet, Take 1 tablet (10 mg total) by mouth daily., Disp: 90 tablet, Rfl: 1 ?  fluticasone (FLONASE) 50 MCG/ACT nasal spray, SPRAY 1 SPRAY INTO EACH NOSTRIL EVERY DAY, Disp: 32 mL, Rfl: 1 ?  furosemide (LASIX) 40 MG tablet, TAKE 1 TABLET BY MOUTH  DAILY, Disp: 90 tablet, Rfl: 3 ?  glucose blood (ONETOUCH VERIO) test strip, Use as directed to check blood sugars 2 times per day dx: e11.65, Disp: 100 each, Rfl: 3 ?  HYDROcodone-acetaminophen (NORCO) 10-325 MG tablet, Take 1 tablet by mouth 5 (five) times daily as needed., Disp: , Rfl:  ?  levothyroxine (SYNTHROID) 125 MCG tablet, Take 125 mcg by mouth daily before breakfast., Disp: , Rfl:  ?  metoprolol succinate (TOPROL-XL) 50 MG 24 hr tablet, TAKE 1 TABLET BY MOUTH  DAILY, Disp: 90 tablet, Rfl: 3 ?  omeprazole (PRILOSEC) 20 MG capsule, TAKE 1 CAPSULE BY MOUTH  DAILY, Disp: 90 capsule, Rfl: 3 ?  OneTouch Delica Lancets 03P MISC, Use as directed to check blood sugars 2 times per day dx: e11.65, Disp: 100 each, Rfl: 3 ?  pravastatin (PRAVACHOL) 40 MG tablet, Take 1 tablet (40 mg total) by mouth daily., Disp: 90 tablet, Rfl: 1 ?  telmisartan (MICARDIS) 80 MG tablet, TAKE 1 TABLET BY MOUTH  DAILY, Disp: 90 tablet, Rfl: 3 ?  timolol (BETIMOL) 0.25 % ophthalmic solution, Place 1 drop into the right eye 2 (two) times daily., Disp: , Rfl:  ?  triamcinolone (KENALOG) 0.1 %, APPLY TO AFFECTED AREA TWICE A DAY, Disp: 30 g, Rfl: 0  ? ?No Known Allergies  ? ?Review of Systems  ?Eyes:  Negative  for blurred vision.  ?Cardiovascular:  Negative for palpitations and orthopnea.  ?Neurological:  Negative for headaches.   ? ?There were no vitals filed for this visit. ?There is no height or weight on file to calculate BMI.  ? ?Objective:  ?Physical Exam  ? ?   ?Assessment And Plan:  ?   ?1. Type 2 diabetes mellitus with stage 3a chronic kidney disease, without long-term current use of insulin ([place]) ? ?2. Parenchymal renal hypertension, stage 1 through stage 4 or unspecified chronic kidney disease ?  ? ? ?Patient was given opportunity to ask questions. Patient verbalized understanding of the plan and was able to repeat key elements of the plan. All questions were answered to their satisfaction.  ?Gwenevere Abbot, CMA  ? ?I, Sheppard Evens Llittleton, CMA, have reviewed all documentation for this visit. The documentation on 04/26/21 for the exam, diagnosis, procedures, and orders are all accurate and complete.  ? ?IF YOU HAVE BEEN REFERRED TO A SPECIALIST, IT MAY TAKE 1-2 WEEKS TO SCHEDULE/PROCESS THE REFERRAL. IF YOU HAVE NOT HEARD FROM US/SPECIALIST IN TWO WEEKS, PLEASE GIVE [place] A CALL AT [phone] X [phone].  ? ?THE PATIENT IS ENCOURAGED TO PRACTICE SOCIAL DISTANCING DUE TO THE COVID-19 PANDEMIC.   ?

## 2021-04-28 NOTE — Progress Notes (Signed)
Pt Cancelled appt

## 2021-04-30 ENCOUNTER — Telehealth: Payer: Medicare Other

## 2021-05-01 ENCOUNTER — Ambulatory Visit: Payer: Medicare Other | Admitting: Internal Medicine

## 2021-05-01 DIAGNOSIS — M1711 Unilateral primary osteoarthritis, right knee: Secondary | ICD-10-CM | POA: Diagnosis not present

## 2021-05-01 DIAGNOSIS — N183 Chronic kidney disease, stage 3 unspecified: Secondary | ICD-10-CM | POA: Diagnosis not present

## 2021-05-01 DIAGNOSIS — B351 Tinea unguium: Secondary | ICD-10-CM | POA: Diagnosis not present

## 2021-05-01 DIAGNOSIS — E0840 Diabetes mellitus due to underlying condition with diabetic neuropathy, unspecified: Secondary | ICD-10-CM | POA: Diagnosis not present

## 2021-05-01 DIAGNOSIS — I890 Lymphedema, not elsewhere classified: Secondary | ICD-10-CM | POA: Diagnosis not present

## 2021-05-01 DIAGNOSIS — L97511 Non-pressure chronic ulcer of other part of right foot limited to breakdown of skin: Secondary | ICD-10-CM | POA: Diagnosis not present

## 2021-05-01 DIAGNOSIS — E1122 Type 2 diabetes mellitus with diabetic chronic kidney disease: Secondary | ICD-10-CM | POA: Diagnosis not present

## 2021-05-01 DIAGNOSIS — M1712 Unilateral primary osteoarthritis, left knee: Secondary | ICD-10-CM | POA: Diagnosis not present

## 2021-05-01 DIAGNOSIS — I129 Hypertensive chronic kidney disease with stage 1 through stage 4 chronic kidney disease, or unspecified chronic kidney disease: Secondary | ICD-10-CM | POA: Diagnosis not present

## 2021-05-01 DIAGNOSIS — D649 Anemia, unspecified: Secondary | ICD-10-CM | POA: Diagnosis not present

## 2021-05-03 DIAGNOSIS — L97511 Non-pressure chronic ulcer of other part of right foot limited to breakdown of skin: Secondary | ICD-10-CM | POA: Diagnosis not present

## 2021-05-03 DIAGNOSIS — M1712 Unilateral primary osteoarthritis, left knee: Secondary | ICD-10-CM | POA: Diagnosis not present

## 2021-05-03 DIAGNOSIS — I890 Lymphedema, not elsewhere classified: Secondary | ICD-10-CM | POA: Diagnosis not present

## 2021-05-03 DIAGNOSIS — E1122 Type 2 diabetes mellitus with diabetic chronic kidney disease: Secondary | ICD-10-CM | POA: Diagnosis not present

## 2021-05-03 DIAGNOSIS — D649 Anemia, unspecified: Secondary | ICD-10-CM | POA: Diagnosis not present

## 2021-05-03 DIAGNOSIS — B351 Tinea unguium: Secondary | ICD-10-CM | POA: Diagnosis not present

## 2021-05-03 DIAGNOSIS — I129 Hypertensive chronic kidney disease with stage 1 through stage 4 chronic kidney disease, or unspecified chronic kidney disease: Secondary | ICD-10-CM | POA: Diagnosis not present

## 2021-05-03 DIAGNOSIS — M1711 Unilateral primary osteoarthritis, right knee: Secondary | ICD-10-CM | POA: Diagnosis not present

## 2021-05-03 DIAGNOSIS — E0840 Diabetes mellitus due to underlying condition with diabetic neuropathy, unspecified: Secondary | ICD-10-CM | POA: Diagnosis not present

## 2021-05-03 DIAGNOSIS — N183 Chronic kidney disease, stage 3 unspecified: Secondary | ICD-10-CM | POA: Diagnosis not present

## 2021-05-08 ENCOUNTER — Other Ambulatory Visit: Payer: Self-pay | Admitting: Internal Medicine

## 2021-05-08 DIAGNOSIS — E1122 Type 2 diabetes mellitus with diabetic chronic kidney disease: Secondary | ICD-10-CM | POA: Diagnosis not present

## 2021-05-08 DIAGNOSIS — B351 Tinea unguium: Secondary | ICD-10-CM | POA: Diagnosis not present

## 2021-05-08 DIAGNOSIS — I129 Hypertensive chronic kidney disease with stage 1 through stage 4 chronic kidney disease, or unspecified chronic kidney disease: Secondary | ICD-10-CM | POA: Diagnosis not present

## 2021-05-08 DIAGNOSIS — M1711 Unilateral primary osteoarthritis, right knee: Secondary | ICD-10-CM | POA: Diagnosis not present

## 2021-05-08 DIAGNOSIS — L97511 Non-pressure chronic ulcer of other part of right foot limited to breakdown of skin: Secondary | ICD-10-CM | POA: Diagnosis not present

## 2021-05-08 DIAGNOSIS — E0840 Diabetes mellitus due to underlying condition with diabetic neuropathy, unspecified: Secondary | ICD-10-CM | POA: Diagnosis not present

## 2021-05-08 DIAGNOSIS — N183 Chronic kidney disease, stage 3 unspecified: Secondary | ICD-10-CM | POA: Diagnosis not present

## 2021-05-08 DIAGNOSIS — D649 Anemia, unspecified: Secondary | ICD-10-CM | POA: Diagnosis not present

## 2021-05-08 DIAGNOSIS — I890 Lymphedema, not elsewhere classified: Secondary | ICD-10-CM | POA: Diagnosis not present

## 2021-05-08 DIAGNOSIS — M1712 Unilateral primary osteoarthritis, left knee: Secondary | ICD-10-CM | POA: Diagnosis not present

## 2021-05-10 DIAGNOSIS — D649 Anemia, unspecified: Secondary | ICD-10-CM | POA: Diagnosis not present

## 2021-05-10 DIAGNOSIS — E1122 Type 2 diabetes mellitus with diabetic chronic kidney disease: Secondary | ICD-10-CM | POA: Diagnosis not present

## 2021-05-10 DIAGNOSIS — L97511 Non-pressure chronic ulcer of other part of right foot limited to breakdown of skin: Secondary | ICD-10-CM | POA: Diagnosis not present

## 2021-05-10 DIAGNOSIS — E0840 Diabetes mellitus due to underlying condition with diabetic neuropathy, unspecified: Secondary | ICD-10-CM | POA: Diagnosis not present

## 2021-05-10 DIAGNOSIS — M1711 Unilateral primary osteoarthritis, right knee: Secondary | ICD-10-CM | POA: Diagnosis not present

## 2021-05-10 DIAGNOSIS — M1712 Unilateral primary osteoarthritis, left knee: Secondary | ICD-10-CM | POA: Diagnosis not present

## 2021-05-10 DIAGNOSIS — N183 Chronic kidney disease, stage 3 unspecified: Secondary | ICD-10-CM | POA: Diagnosis not present

## 2021-05-10 DIAGNOSIS — I129 Hypertensive chronic kidney disease with stage 1 through stage 4 chronic kidney disease, or unspecified chronic kidney disease: Secondary | ICD-10-CM | POA: Diagnosis not present

## 2021-05-10 DIAGNOSIS — I890 Lymphedema, not elsewhere classified: Secondary | ICD-10-CM | POA: Diagnosis not present

## 2021-05-10 DIAGNOSIS — B351 Tinea unguium: Secondary | ICD-10-CM | POA: Diagnosis not present

## 2021-05-14 ENCOUNTER — Telehealth: Payer: Medicare Other

## 2021-05-14 ENCOUNTER — Telehealth: Payer: Self-pay

## 2021-05-14 DIAGNOSIS — N183 Chronic kidney disease, stage 3 unspecified: Secondary | ICD-10-CM | POA: Diagnosis not present

## 2021-05-14 DIAGNOSIS — E1122 Type 2 diabetes mellitus with diabetic chronic kidney disease: Secondary | ICD-10-CM | POA: Diagnosis not present

## 2021-05-14 DIAGNOSIS — I129 Hypertensive chronic kidney disease with stage 1 through stage 4 chronic kidney disease, or unspecified chronic kidney disease: Secondary | ICD-10-CM | POA: Diagnosis not present

## 2021-05-14 DIAGNOSIS — D649 Anemia, unspecified: Secondary | ICD-10-CM | POA: Diagnosis not present

## 2021-05-14 DIAGNOSIS — M1712 Unilateral primary osteoarthritis, left knee: Secondary | ICD-10-CM | POA: Diagnosis not present

## 2021-05-14 DIAGNOSIS — I890 Lymphedema, not elsewhere classified: Secondary | ICD-10-CM | POA: Diagnosis not present

## 2021-05-14 DIAGNOSIS — M1711 Unilateral primary osteoarthritis, right knee: Secondary | ICD-10-CM | POA: Diagnosis not present

## 2021-05-14 DIAGNOSIS — E0840 Diabetes mellitus due to underlying condition with diabetic neuropathy, unspecified: Secondary | ICD-10-CM | POA: Diagnosis not present

## 2021-05-14 DIAGNOSIS — L97511 Non-pressure chronic ulcer of other part of right foot limited to breakdown of skin: Secondary | ICD-10-CM | POA: Diagnosis not present

## 2021-05-14 DIAGNOSIS — B351 Tinea unguium: Secondary | ICD-10-CM | POA: Diagnosis not present

## 2021-05-14 NOTE — Telephone Encounter (Signed)
?  Care Management  ? ?Follow Up Note ? ? ?05/14/2021 ?Name: [name] MRN: [id number] DOB: Oct 28, 1945 ? ? ?Referred by: Dorothyann Peng, MD ?Reason for referral : Chronic Care Management (RN CM Follow up call ) ? ? ?An unsuccessful telephone outreach was attempted today. The patient was referred to the case management team for assistance with care management and care coordination.  ? ?Follow Up Plan: A HIPPA compliant phone message was left for the patient providing contact information and requesting a return call.  ? ?Delsa Sale, RN, BSN, CCM ?Care Management Coordinator ?[facility]  ?Direct Phone: [phone] ? ? ?

## 2021-05-17 DIAGNOSIS — N183 Chronic kidney disease, stage 3 unspecified: Secondary | ICD-10-CM | POA: Diagnosis not present

## 2021-05-17 DIAGNOSIS — L97511 Non-pressure chronic ulcer of other part of right foot limited to breakdown of skin: Secondary | ICD-10-CM | POA: Diagnosis not present

## 2021-05-17 DIAGNOSIS — I129 Hypertensive chronic kidney disease with stage 1 through stage 4 chronic kidney disease, or unspecified chronic kidney disease: Secondary | ICD-10-CM | POA: Diagnosis not present

## 2021-05-17 DIAGNOSIS — I890 Lymphedema, not elsewhere classified: Secondary | ICD-10-CM | POA: Diagnosis not present

## 2021-05-17 DIAGNOSIS — E0840 Diabetes mellitus due to underlying condition with diabetic neuropathy, unspecified: Secondary | ICD-10-CM | POA: Diagnosis not present

## 2021-05-17 DIAGNOSIS — E1122 Type 2 diabetes mellitus with diabetic chronic kidney disease: Secondary | ICD-10-CM | POA: Diagnosis not present

## 2021-05-17 DIAGNOSIS — D649 Anemia, unspecified: Secondary | ICD-10-CM | POA: Diagnosis not present

## 2021-05-17 DIAGNOSIS — M1712 Unilateral primary osteoarthritis, left knee: Secondary | ICD-10-CM | POA: Diagnosis not present

## 2021-05-17 DIAGNOSIS — M1711 Unilateral primary osteoarthritis, right knee: Secondary | ICD-10-CM | POA: Diagnosis not present

## 2021-05-17 DIAGNOSIS — B351 Tinea unguium: Secondary | ICD-10-CM | POA: Diagnosis not present

## 2021-05-21 DIAGNOSIS — I129 Hypertensive chronic kidney disease with stage 1 through stage 4 chronic kidney disease, or unspecified chronic kidney disease: Secondary | ICD-10-CM | POA: Diagnosis not present

## 2021-05-21 DIAGNOSIS — M1711 Unilateral primary osteoarthritis, right knee: Secondary | ICD-10-CM | POA: Diagnosis not present

## 2021-05-21 DIAGNOSIS — L97511 Non-pressure chronic ulcer of other part of right foot limited to breakdown of skin: Secondary | ICD-10-CM | POA: Diagnosis not present

## 2021-05-21 DIAGNOSIS — I890 Lymphedema, not elsewhere classified: Secondary | ICD-10-CM | POA: Diagnosis not present

## 2021-05-21 DIAGNOSIS — M1712 Unilateral primary osteoarthritis, left knee: Secondary | ICD-10-CM | POA: Diagnosis not present

## 2021-05-21 DIAGNOSIS — E0840 Diabetes mellitus due to underlying condition with diabetic neuropathy, unspecified: Secondary | ICD-10-CM | POA: Diagnosis not present

## 2021-05-21 DIAGNOSIS — N183 Chronic kidney disease, stage 3 unspecified: Secondary | ICD-10-CM | POA: Diagnosis not present

## 2021-05-21 DIAGNOSIS — D649 Anemia, unspecified: Secondary | ICD-10-CM | POA: Diagnosis not present

## 2021-05-21 DIAGNOSIS — E1122 Type 2 diabetes mellitus with diabetic chronic kidney disease: Secondary | ICD-10-CM | POA: Diagnosis not present

## 2021-05-21 DIAGNOSIS — B351 Tinea unguium: Secondary | ICD-10-CM | POA: Diagnosis not present

## 2021-05-23 DIAGNOSIS — I129 Hypertensive chronic kidney disease with stage 1 through stage 4 chronic kidney disease, or unspecified chronic kidney disease: Secondary | ICD-10-CM | POA: Diagnosis not present

## 2021-05-23 DIAGNOSIS — Z96659 Presence of unspecified artificial knee joint: Secondary | ICD-10-CM | POA: Diagnosis not present

## 2021-05-23 DIAGNOSIS — E1122 Type 2 diabetes mellitus with diabetic chronic kidney disease: Secondary | ICD-10-CM | POA: Diagnosis not present

## 2021-05-23 DIAGNOSIS — M170 Bilateral primary osteoarthritis of knee: Secondary | ICD-10-CM | POA: Diagnosis not present

## 2021-05-24 DIAGNOSIS — M1712 Unilateral primary osteoarthritis, left knee: Secondary | ICD-10-CM | POA: Diagnosis not present

## 2021-05-24 DIAGNOSIS — L97511 Non-pressure chronic ulcer of other part of right foot limited to breakdown of skin: Secondary | ICD-10-CM | POA: Diagnosis not present

## 2021-05-24 DIAGNOSIS — E0840 Diabetes mellitus due to underlying condition with diabetic neuropathy, unspecified: Secondary | ICD-10-CM | POA: Diagnosis not present

## 2021-05-24 DIAGNOSIS — E1122 Type 2 diabetes mellitus with diabetic chronic kidney disease: Secondary | ICD-10-CM | POA: Diagnosis not present

## 2021-05-24 DIAGNOSIS — B351 Tinea unguium: Secondary | ICD-10-CM | POA: Diagnosis not present

## 2021-05-24 DIAGNOSIS — I129 Hypertensive chronic kidney disease with stage 1 through stage 4 chronic kidney disease, or unspecified chronic kidney disease: Secondary | ICD-10-CM | POA: Diagnosis not present

## 2021-05-24 DIAGNOSIS — D649 Anemia, unspecified: Secondary | ICD-10-CM | POA: Diagnosis not present

## 2021-05-24 DIAGNOSIS — I890 Lymphedema, not elsewhere classified: Secondary | ICD-10-CM | POA: Diagnosis not present

## 2021-05-24 DIAGNOSIS — N183 Chronic kidney disease, stage 3 unspecified: Secondary | ICD-10-CM | POA: Diagnosis not present

## 2021-05-24 DIAGNOSIS — M1711 Unilateral primary osteoarthritis, right knee: Secondary | ICD-10-CM | POA: Diagnosis not present

## 2021-05-29 DIAGNOSIS — L97511 Non-pressure chronic ulcer of other part of right foot limited to breakdown of skin: Secondary | ICD-10-CM | POA: Diagnosis not present

## 2021-05-29 DIAGNOSIS — B351 Tinea unguium: Secondary | ICD-10-CM | POA: Diagnosis not present

## 2021-05-29 DIAGNOSIS — M1711 Unilateral primary osteoarthritis, right knee: Secondary | ICD-10-CM | POA: Diagnosis not present

## 2021-05-29 DIAGNOSIS — M1712 Unilateral primary osteoarthritis, left knee: Secondary | ICD-10-CM | POA: Diagnosis not present

## 2021-05-29 DIAGNOSIS — D649 Anemia, unspecified: Secondary | ICD-10-CM | POA: Diagnosis not present

## 2021-05-29 DIAGNOSIS — I129 Hypertensive chronic kidney disease with stage 1 through stage 4 chronic kidney disease, or unspecified chronic kidney disease: Secondary | ICD-10-CM | POA: Diagnosis not present

## 2021-05-29 DIAGNOSIS — N183 Chronic kidney disease, stage 3 unspecified: Secondary | ICD-10-CM | POA: Diagnosis not present

## 2021-05-29 DIAGNOSIS — E1122 Type 2 diabetes mellitus with diabetic chronic kidney disease: Secondary | ICD-10-CM | POA: Diagnosis not present

## 2021-05-29 DIAGNOSIS — I890 Lymphedema, not elsewhere classified: Secondary | ICD-10-CM | POA: Diagnosis not present

## 2021-05-29 DIAGNOSIS — E0840 Diabetes mellitus due to underlying condition with diabetic neuropathy, unspecified: Secondary | ICD-10-CM | POA: Diagnosis not present

## 2021-05-31 ENCOUNTER — Ambulatory Visit: Payer: Medicare Other | Admitting: Internal Medicine

## 2021-05-31 ENCOUNTER — Telehealth: Payer: Medicare Other

## 2021-05-31 ENCOUNTER — Ambulatory Visit (INDEPENDENT_AMBULATORY_CARE_PROVIDER_SITE_OTHER): Payer: Medicare Other

## 2021-05-31 VITALS — Ht 67.0 in | Wt 300.0 lb

## 2021-05-31 DIAGNOSIS — Z0000 Encounter for general adult medical examination without abnormal findings: Secondary | ICD-10-CM | POA: Diagnosis not present

## 2021-05-31 NOTE — Patient Instructions (Signed)
[name] , Thank you for taking time to come for your Medicare Wellness Visit. I appreciate your ongoing commitment to your health goals. Please review the following plan we discussed and let me know if I can assist you in the future.   Screening recommendations/referrals: Colonoscopy: cologuard 08/2020 Mammogram: declines at this time Bone Density: completed 03/17/2018 Recommended yearly ophthalmology/optometry visit for glaucoma screening and checkup Recommended yearly dental visit for hygiene and checkup  Vaccinations: Influenza vaccine: due 08/14/2021 Pneumococcal vaccine: due Tdap vaccine: completed 02/15/2013, due 02/16/2023 Shingles vaccine: discussed   Covid-19: 03/24/2019, 02/27/2019  Advanced directives: copy in chart  Conditions/risks identified: none  Next appointment: Follow up in one year for your annual wellness visit    Preventive Care 65 Years and Older, Female Preventive care refers to lifestyle choices and visits with your health care provider that can promote health and wellness. What does preventive care include? A yearly physical exam. This is also called an annual well check. Dental exams once or twice a year. Routine eye exams. Ask your health care provider how often you should have your eyes checked. Personal lifestyle choices, including: Daily care of your teeth and gums. Regular physical activity. Eating a healthy diet. Avoiding tobacco and drug use. Limiting alcohol use. Practicing safe sex. Taking low-dose aspirin every day. Taking vitamin and mineral supplements as recommended by your health care provider. What happens during an annual well check? The services and screenings done by your health care provider during your annual well check will depend on your age, overall health, lifestyle risk factors, and family history of disease. Counseling  Your health care provider may ask you questions about your: Alcohol use. Tobacco use. Drug use. Emotional  well-being. Home and relationship well-being. Sexual activity. Eating habits. History of falls. Memory and ability to understand (cognition). Work and work Astronomer. Reproductive health. Screening  You may have the following tests or measurements: Height, weight, and BMI. Blood pressure. Lipid and cholesterol levels. These may be checked every 5 years, or more frequently if you are over 63 years old. Skin check. Lung cancer screening. You may have this screening every year starting at age 58 if you have a 30-pack-year history of smoking and currently smoke or have quit within the past 15 years. Fecal occult blood test (FOBT) of the stool. You may have this test every year starting at age 80. Flexible sigmoidoscopy or colonoscopy. You may have a sigmoidoscopy every 5 years or a colonoscopy every 10 years starting at age 46. Hepatitis C blood test. Hepatitis B blood test. Sexually transmitted disease (STD) testing. Diabetes screening. This is done by checking your blood sugar (glucose) after you have not eaten for a while (fasting). You may have this done every 1-3 years. Bone density scan. This is done to screen for osteoporosis. You may have this done starting at age 13. Mammogram. This may be done every 1-2 years. Talk to your health care provider about how often you should have regular mammograms. Talk with your health care provider about your test results, treatment options, and if necessary, the need for more tests. Vaccines  Your health care provider may recommend certain vaccines, such as: Influenza vaccine. This is recommended every year. Tetanus, diphtheria, and acellular pertussis (Tdap, Td) vaccine. You may need a Td booster every 10 years. Zoster vaccine. You may need this after age 69. Pneumococcal 13-valent conjugate (PCV13) vaccine. One dose is recommended after age 47. Pneumococcal polysaccharide (PPSV23) vaccine. One dose is recommended after age  64. Talk to your  health care provider about which screenings and vaccines you need and how often you need them. This information is not intended to replace advice given to you by your health care provider. Make sure you discuss any questions you have with your health care provider. Document Released: 01/27/2015 Document Revised: 09/20/2015 Document Reviewed: 11/01/2014 Elsevier Interactive Patient Education  2017 [place] Prevention in the Home Falls can cause injuries. They can happen to people of all ages. There are many things you can do to make your home safe and to help prevent falls. What can I do on the outside of my home? Regularly fix the edges of walkways and driveways and fix any cracks. Remove anything that might make you trip as you walk through a door, such as a raised step or threshold. Trim any bushes or trees on the path to your home. Use bright outdoor lighting. Clear any walking paths of anything that might make someone trip, such as rocks or tools. Regularly check to see if handrails are loose or broken. Make sure that both sides of any steps have handrails. Any raised decks and porches should have guardrails on the edges. Have any leaves, snow, or ice cleared regularly. Use sand or salt on walking paths during winter. Clean up any spills in your garage right away. This includes oil or grease spills. What can I do in the bathroom? Use night lights. Install grab bars by the toilet and in the tub and shower. Do not use towel bars as grab bars. Use non-skid mats or decals in the tub or shower. If you need to sit down in the shower, use a plastic, non-slip stool. Keep the floor dry. Clean up any water that spills on the floor as soon as it happens. Remove soap buildup in the tub or shower regularly. Attach bath mats securely with double-sided non-slip rug tape. Do not have throw rugs and other things on the floor that can make you trip. What can I do in the bedroom? Use night  lights. Make sure that you have a light by your bed that is easy to reach. Do not use any sheets or blankets that are too big for your bed. They should not hang down onto the floor. Have a firm chair that has side arms. You can use this for support while you get dressed. Do not have throw rugs and other things on the floor that can make you trip. What can I do in the kitchen? Clean up any spills right away. Avoid walking on wet floors. Keep items that you use a lot in easy-to-reach places. If you need to reach something above you, use a strong step stool that has a grab bar. Keep electrical cords out of the way. Do not use floor polish or wax that makes floors slippery. If you must use wax, use non-skid floor wax. Do not have throw rugs and other things on the floor that can make you trip. What can I do with my stairs? Do not leave any items on the stairs. Make sure that there are handrails on both sides of the stairs and use them. Fix handrails that are broken or loose. Make sure that handrails are as long as the stairways. Check any carpeting to make sure that it is firmly attached to the stairs. Fix any carpet that is loose or worn. Avoid having throw rugs at the top or bottom of the stairs. If you do have  throw rugs, attach them to the floor with carpet tape. Make sure that you have a light switch at the top of the stairs and the bottom of the stairs. If you do not have them, ask someone to add them for you. What else can I do to help prevent falls? Wear shoes that: Do not have high heels. Have rubber bottoms. Are comfortable and fit you well. Are closed at the toe. Do not wear sandals. If you use a stepladder: Make sure that it is fully opened. Do not climb a closed stepladder. Make sure that both sides of the stepladder are locked into place. Ask someone to hold it for you, if possible. Clearly mark and make sure that you can see: Any grab bars or handrails. First and last  steps. Where the edge of each step is. Use tools that help you move around (mobility aids) if they are needed. These include: Canes. Walkers. Scooters. Crutches. Turn on the lights when you go into a dark area. Replace any light bulbs as soon as they burn out. Set up your furniture so you have a clear path. Avoid moving your furniture around. If any of your floors are uneven, fix them. If there are any pets around you, be aware of where they are. Review your medicines with your doctor. Some medicines can make you feel dizzy. This can increase your chance of falling. Ask your doctor what other things that you can do to help prevent falls. This information is not intended to replace advice given to you by your health care provider. Make sure you discuss any questions you have with your health care provider. Document Released: 10/27/2008 Document Revised: 06/08/2015 Document Reviewed: 02/04/2014 Elsevier Interactive Patient Education  2017 [employer].

## 2021-05-31 NOTE — Progress Notes (Signed)
I connected with Mary [name] today by telephone and verified that I am speaking with the correct person using two identifiers. Location patient: home Location provider: work Persons participating in the virtual visit: Annice Jolly, Glenna Durand LPN.   I discussed the limitations, risks, security and privacy concerns of performing an evaluation and management service by telephone and the availability of in person appointments. I also discussed with the patient that there may be a patient responsible charge related to this service. The patient expressed understanding and verbally consented to this telephonic visit.    Interactive audio and video telecommunications were attempted between this provider and patient, however failed, due to patient having technical difficulties OR patient did not have access to video capability.  We continued and completed visit with audio only.     Vital signs may be patient reported or missing.  Subjective:   Mary [name] is a 76 y.o. female who presents for Medicare Annual (Subsequent) preventive examination.  Review of Systems     Cardiac Risk Factors include: advanced age (>69mn, >>40women);diabetes mellitus;hypertension;obesity (BMI >30kg/m2)     Objective:    Today's Vitals   05/31/21 0956  Weight: 300 lb (136.1 kg)  Height: 5' 7"  (1.702 m)   Body mass index is 46.99 kg/m.     05/31/2021   10:02 AM 07/22/2020    7:23 PM 05/17/2020    3:42 PM 01/27/2020    4:52 PM 09/02/2019   10:15 AM 06/28/2019    9:45 AM 07/21/2018   10:02 AM  Advanced Directives  Does Patient Have a Medical Advance Directive? Yes No Yes No Yes Yes Yes  Type of [place] will  Out of facility DNR (pink MOST or yellow form)  [place] will [place] will  Copy of [place] Chart? Yes - validated most recent copy  scanned in chart (See row information)    No - copy requested  No - copy requested  Would patient like information on creating a medical advance directive?  No - Patient declined  No - Patient declined  No - Patient declined     Current Medications (verified) Outpatient Encounter Medications as of 05/31/2021  Medication Sig   aspirin 81 MG chewable tablet 1 tablet   Assure Comfort Lancets 28G MISC    Blood Glucose Monitoring Suppl (ONETOUCH VERIO FLEX SYSTEM) w/Device KIT Use as directed to check blood sugars 2 times per day dx: e11.65   brimonidine (ALPHAGAN) 0.2 % ophthalmic solution Place 1 drop into both eyes 2 (two) times daily.   cetirizine (ZYRTEC) 10 MG tablet Take 1 tablet (10 mg total) by mouth daily.   fluticasone (FLONASE) 50 MCG/ACT nasal spray SPRAY 1 SPRAY INTO EACH NOSTRIL EVERY DAY   furosemide (LASIX) 40 MG tablet TAKE 1 TABLET BY MOUTH  DAILY   glucose blood (ONETOUCH VERIO) test strip Use as directed to check blood sugars 2 times per day dx: e11.65   HYDROcodone-acetaminophen (NORCO) 10-325 MG tablet Take 1 tablet by mouth 5 (five) times daily as needed.   levothyroxine (SYNTHROID) 125 MCG tablet Take 125 mcg by mouth daily before breakfast.   metoprolol succinate (TOPROL-XL) 50 MG 24 hr tablet TAKE 1 TABLET BY MOUTH  DAILY   omeprazole (PRILOSEC) 20 MG capsule TAKE 1 CAPSULE BY MOUTH  DAILY   OneTouch Delica Lancets 312XMISC Use as directed to check blood sugars 2  times per day dx: e11.65   pravastatin (PRAVACHOL) 40 MG tablet Take 1 tablet (40 mg total) by mouth daily.   telmisartan (MICARDIS) 80 MG tablet TAKE 1 TABLET BY MOUTH  DAILY   timolol (BETIMOL) 0.25 % ophthalmic solution Place 1 drop into the right eye 2 (two) times daily.   triamcinolone (KENALOG) 0.1 % APPLY TO AFFECTED AREA TWICE A DAY   No facility-administered encounter medications on file as of 05/31/2021.    Allergies (verified) Patient has no known allergies.   History: Past Medical History:   Diagnosis Date   Arthritis    osteoarthritis   Cataract    OS   Diabetes mellitus    Insulin dependent   Diabetic retinopathy (HCC)    NPDR OU   Glaucoma    POAG OD   H/O cardiovascular stress test    pt. reports 10/13- was normal per pt.    Hyperlipemia    Hypertension    Hypertensive retinopathy    OU   Past Surgical History:  Procedure Laterality Date   CARPAL TUNNEL RELEASE Bilateral    CATARACT EXTRACTION Right 10/05/2018   Dr. Kathlen Mody   EYE SURGERY Right    Cat Sx   KNEE ARTHROSCOPY     right   QUADRICEPS TENDON REPAIR  02/17/2012   Procedure: REPAIR QUADRICEP TENDON;  Surgeon: Vickey Huger, MD;  Location: [place];  Service: Orthopedics;  Laterality: Right;  right quadriceps repair with latera release   QUADRICEPS TENDON REPAIR Right 05/15/2012   Procedure: REPAIR QUADRICEP TENDON;  Surgeon: Vickey Huger, MD;  Location: [facility] [facility];  Service: Orthopedics;  Laterality: Right;   TOTAL KNEE ARTHROPLASTY  10/21/2011   Procedure: TOTAL KNEE ARTHROPLASTY;  Surgeon: Rudean Haskell, MD;  Location: [place];  Service: Orthopedics;  Laterality: Right;   TUBAL LIGATION     Family History  Problem Relation Age of Onset   Hypertension Mother    Heart Problems Father    Gout Father    Arthritis Father    Social History   Socioeconomic History   Marital status: Single    Spouse name: Not on file   Number of children: Not on file   Years of education: Not on file   Highest education level: Not on file  Occupational History   Occupation: retired  Tobacco Use   Smoking status: Former    Packs/day: 1.00    Years: 30.00    Pack years: 30.00    Types: Cigarettes    Start date: 01/15/1967    Quit date: 06/28/2014    Years since quitting: 6.9   Smokeless tobacco: Never  Vaping Use   Vaping Use: Never used  Substance and Sexual Activity   Alcohol use: No   Drug use: Yes    Types: Hydrocodone   Sexual activity: Not Currently  Other Topics Concern   Not on file  Social History  Narrative   Not on file   Social Determinants of Health   Financial Resource Strain: Low Risk    Difficulty of Paying Living Expenses: Not hard at all  Food Insecurity: No Food Insecurity   Worried About Charity fundraiser in the Last Year: Never true   [place] in the Last Year: Never true  Transportation Needs: No Transportation Needs   Lack of Transportation (Medical): No   Lack of Transportation (Non-Medical): No  Physical Activity: Inactive   Days of Exercise per Week: 0 days   Minutes of Exercise  per Session: 0 min  Stress: No Stress Concern Present   Feeling of Stress : Not at all  Social Connections: Not on file    Tobacco Counseling Counseling given: Not Answered   Clinical Intake:  Pre-visit preparation completed: Yes  Pain : No/denies pain     Nutritional Status: BMI > 30  Obese Nutritional Risks: None Diabetes: Yes  How often do you need to have someone help you when you read instructions, pamphlets, or other written materials from your doctor or pharmacy?: 1 - Never What is the last grade level you completed in school?: GED  Diabetic? Yes Nutrition Risk Assessment:  Has the patient had any N/V/D within the last 2 months?  No  Does the patient have any non-healing wounds?  No  Has the patient had any unintentional weight loss or weight gain?  No   Diabetes:  Is the patient diabetic?  Yes  If diabetic, was a CBG obtained today?  No  Did the patient bring in their glucometer from home?  No  How often do you monitor your CBG's? Every other day.   Financial Strains and Diabetes Management:  Are you having any financial strains with the device, your supplies or your medication? No .  Does the patient want to be seen by Chronic Care Management for management of their diabetes?  No  Would the patient like to be referred to a Nutritionist or for Diabetic Management?  No   Diabetic Exams:  Diabetic Eye Exam: Completed 10/02/2020 Diabetic Foot  Exam: Overdue, Pt has been advised about the importance in completing this exam. Pt is scheduled for diabetic foot exam on next appointment.   Interpreter Needed?: No  Information entered by :: NAllen LPN   Activities of Daily Living    05/31/2021   10:05 AM  In your present state of health, do you have any difficulty performing the following activities:  Hearing? 0  Vision? 1  Comment slightly dark  Difficulty concentrating or making decisions? 0  Walking or climbing stairs? 1  Dressing or bathing? 0  Doing errands, shopping? 0  Preparing Food and eating ? N  Using the Toilet? N  In the past six months, have you accidently leaked urine? N  Do you have problems with loss of bowel control? N  Managing your Medications? N  Managing your Finances? N  Housekeeping or managing your Housekeeping? Y    Patient Care Team: Glendale Chard, MD as PCP - General (Internal Medicine) Hayden Pedro, MD as Consulting Physician (Ophthalmology) Jacelyn Pi, MD as Consulting Physician (Endocrinology) Rex Kras, Claudette Stapler, RN as Case Manager [name] as Social Worker Troxler, Lujean Amel, OT as Occupational Therapist (Occupational Therapy)  Indicate any recent Medical Services you may have received from other than [facility] providers in the past year (date may be approximate).     Assessment:   This is a routine wellness examination for Mary [name].  Hearing/Vision screen Vision Screening - Comments:: Regular eye exams, Dr. Zigmund Daniel, [facility]  Dietary issues and exercise activities discussed: Current Exercise Habits: The patient does not participate in regular exercise at present   Goals Addressed             This Visit's Progress    Patient Stated       05/31/2021, wants to get legs stronger so can walk better       Depression Screen    05/31/2021   10:05 AM 06/27/2020   11:11 AM 05/17/2020  3:45 PM 09/13/2019   12:30 PM 09/02/2019   10:18 AM 08/09/2019   10:37 AM  07/21/2018   10:04 AM  PHQ 2/9 Scores  PHQ - 2 Score 0 0 0 0 0 0 0  PHQ- 9 Score       0    Fall Risk    05/31/2021   10:04 AM 05/17/2020    3:44 PM 09/02/2019   10:17 AM 08/30/2019    5:47 PM 11/16/2018    8:53 AM  Fall Risk   Falls in the past year? 1 1 1 1 1   Comment legs gave out lost balance legs gave away, slid out of the bed    Number falls in past yr: 1 1 1 1 1   Injury with Fall? 0 0 0 0 0  Risk for fall due to : Impaired balance/gait;Impaired mobility;Medication side effect Impaired balance/gait;Impaired mobility;Medication side effect Impaired mobility;History of fall(s);Medication side effect History of fall(s);Impaired balance/gait;Impaired mobility;Impaired vision   Follow up Falls evaluation completed;Education provided;Falls prevention discussed Falls evaluation completed;Education provided;Falls prevention discussed Falls evaluation completed;Education provided;Falls prevention discussed Falls evaluation completed;Education provided;Falls prevention discussed     FALL RISK PREVENTION PERTAINING TO THE HOME:  Any stairs in or around the home? No  If so, are there any without handrails?  N/a Home free of loose throw rugs in walkways, pet beds, electrical cords, etc? Yes  Adequate lighting in your home to reduce risk of falls? Yes   ASSISTIVE DEVICES UTILIZED TO PREVENT FALLS:  Life alert? No  Use of a cane, walker or w/c? Yes  Grab bars in the bathroom? Yes  Shower chair or bench in shower? Yes  Elevated toilet seat or a handicapped toilet? Yes   TIMED UP AND GO:  Was the test performed? No .      Cognitive Function:        05/31/2021   10:07 AM 05/17/2020    3:47 PM 09/02/2019   10:21 AM 07/21/2018   10:07 AM 10/30/2017   12:10 PM  6CIT Screen  What Year? 0 points 0 points 0 points 0 points 0 points  What month? 0 points 0 points 0 points 0 points 0 points  What time? 0 points 0 points 0 points 0 points 0 points  Count back from 20 0 points 0 points 0  points 0 points 0 points  Months in reverse 0 points 0 points 0 points 0 points 0 points  Repeat phrase 4 points 8 points 0 points 0 points 0 points  Total Score 4 points 8 points 0 points 0 points 0 points    Immunizations Immunization History  Administered Date(s) Administered   Fluad Quad(high Dose 65+) 11/24/2019, 12/12/2020   Influenza Split 10/22/2011   Influenza, High Dose Seasonal PF 11/16/2018, 12/26/2018   Influenza, Quadrivalent, Recombinant, Inj, Pf 10/07/2016, 10/13/2017   Influenza-Unspecified 10/16/2017   PFIZER(Purple Top)SARS-COV-2 Vaccination 02/27/2019, 03/24/2019   Pneumococcal Polysaccharide-23 10/15/2016   Tdap 02/15/2013    TDAP status: Up to date  Flu Vaccine status: Up to date  Pneumococcal vaccine status: Due, Education has been provided regarding the importance of this vaccine. Advised may receive this vaccine at local pharmacy or Health Dept. Aware to provide a copy of the vaccination record if obtained from local pharmacy or Health Dept. Verbalized acceptance and understanding.  Covid-19 vaccine status: Completed vaccines  Qualifies for Shingles Vaccine? Yes   Zostavax completed No   Shingrix Completed?: No.    Education has been provided regarding  the importance of this vaccine. Patient has been advised to call insurance company to determine out of pocket expense if they have not yet received this vaccine. Advised may also receive vaccine at local pharmacy or Health Dept. Verbalized acceptance and understanding.  Screening Tests Health Maintenance  Topic Date Due   FOOT EXAM  Never done   Zoster Vaccines- Shingrix (1 of 2) Never done   COLONOSCOPY (Pts 45-38yr Insurance coverage will need to be confirmed)  Never done   Pneumonia Vaccine 74 Years old (2 - PCV) 10/15/2017   COVID-19 Vaccine (3 - Pfizer risk series) 04/21/2019   MAMMOGRAM  03/16/2020   HEMOGLOBIN A1C  06/11/2021   INFLUENZA VACCINE  08/14/2021   OPHTHALMOLOGY EXAM  10/02/2021    TETANUS/TDAP  02/16/2023   DEXA SCAN  Completed   Hepatitis C Screening  Completed   HPV VACCINES  Aged Out    Health Maintenance  Health Maintenance Due  Topic Date Due   FOOT EXAM  Never done   Zoster Vaccines- Shingrix (1 of 2) Never done   COLONOSCOPY (Pts 45-449yrInsurance coverage will need to be confirmed)  Never done   Pneumonia Vaccine 6522Years old (2 - PCV) 10/15/2017   COVID-19 Vaccine (3 - Pfizer risk series) 04/21/2019   MAMMOGRAM  03/16/2020    Colorectal cancer screening: Type of screening: Cologuard. Completed 08/2020. Repeat every 3 years  Mammogram status: decline at this time  Bone Density status: Completed 03/17/2018.   Lung Cancer Screening: (Low Dose CT Chest recommended if Age 74-80ears, 30 pack-year currently smoking OR have quit w/in 15years.) does not qualify.   Lung Cancer Screening Referral: no  Additional Screening:  Hepatitis C Screening: does qualify; Completed 11/18/2017  Vision Screening: Recommended annual ophthalmology exams for early detection of glaucoma and other disorders of the eye. Is the patient up to date with their annual eye exam?  Yes  Who is the provider or what is the name of the office in which the patient attends annual eye exams? [facility] is not established with a provider, would they like to be referred to a provider to establish care? No .   Dental Screening: Recommended annual dental exams for proper oral hygiene  Community Resource Referral / Chronic Care Management: CRR required this visit?  No   CCM required this visit?  No      Plan:     I have personally reviewed and noted the following in the patient's chart:   Medical and social history Use of alcohol, tobacco or illicit drugs  Current medications and supplements including opioid prescriptions.  Functional ability and status Nutritional status Physical activity Advanced directives List of other  physicians Hospitalizations, surgeries, and ER visits in previous 12 months Vitals Screenings to include cognitive, depression, and falls Referrals and appointments  In addition, I have reviewed and discussed with patient certain preventive protocols, quality metrics, and best practice recommendations. A written personalized care plan for preventive services as well as general preventive health recommendations were provided to patient.     NiKellie SimmeringLPN   05/22/73/3005 Nurse Notes: none  Due to this being a virtual visit, the after visit summary with patients personalized plan was offered to patient via mail or my-chart. Patient would like to access on my-chart

## 2021-06-04 ENCOUNTER — Ambulatory Visit (INDEPENDENT_AMBULATORY_CARE_PROVIDER_SITE_OTHER): Payer: Self-pay

## 2021-06-04 DIAGNOSIS — E1122 Type 2 diabetes mellitus with diabetic chronic kidney disease: Secondary | ICD-10-CM

## 2021-06-04 DIAGNOSIS — I129 Hypertensive chronic kidney disease with stage 1 through stage 4 chronic kidney disease, or unspecified chronic kidney disease: Secondary | ICD-10-CM

## 2021-06-04 DIAGNOSIS — N183 Chronic kidney disease, stage 3 unspecified: Secondary | ICD-10-CM

## 2021-06-04 NOTE — Chronic Care Management (AMB) (Signed)
Chronic Care Management    Social Work Note  06/04/2021 Name: Mary [name] MRN: [id number] DOB: [birth year]  Mary [name] is a 76 y.o. year old female who is a primary care patient of Mary Chard, MD. The CCM team was consulted to assist the patient with chronic disease management and/or care coordination needs related to:  DM, CKD III, Parenchymal Renal HTN .   Engaged with patient by telephone for follow up visit in response to provider referral for social work chronic care management and care coordination services.   Consent to Services:  The patient was given information about Chronic Care Management services, agreed to services, and gave verbal consent prior to initiation of services.  Please see initial visit note for detailed documentation.   Patient agreed to services and consent obtained.   Assessment: Review of patient past medical history, allergies, medications, and health status, including review of relevant consultants reports was performed today as part of a comprehensive evaluation and provision of chronic care management and care coordination services.     SDOH (Social Determinants of Health) assessments and interventions performed:    Advanced Directives Status: Not addressed in this encounter.  CCM Care Plan  No Known Allergies  Outpatient Encounter Medications as of 06/04/2021  Medication Sig   aspirin 81 MG chewable tablet 1 tablet   Assure Comfort Lancets 28G MISC    Blood Glucose Monitoring Suppl (ONETOUCH VERIO FLEX SYSTEM) w/Device KIT Use as directed to check blood sugars 2 times per day dx: e11.65   brimonidine (ALPHAGAN) 0.2 % ophthalmic solution Place 1 drop into both eyes 2 (two) times daily.   cetirizine (ZYRTEC) 10 MG tablet Take 1 tablet (10 mg total) by mouth daily.   fluticasone (FLONASE) 50 MCG/ACT nasal spray SPRAY 1 SPRAY INTO EACH NOSTRIL EVERY DAY   furosemide (LASIX) 40 MG tablet TAKE 1 TABLET BY MOUTH  DAILY   glucose blood  (ONETOUCH VERIO) test strip Use as directed to check blood sugars 2 times per day dx: e11.65   HYDROcodone-acetaminophen (NORCO) 10-325 MG tablet Take 1 tablet by mouth 5 (five) times daily as needed.   levothyroxine (SYNTHROID) 125 MCG tablet Take 125 mcg by mouth daily before breakfast.   metoprolol succinate (TOPROL-XL) 50 MG 24 hr tablet TAKE 1 TABLET BY MOUTH  DAILY   omeprazole (PRILOSEC) 20 MG capsule TAKE 1 CAPSULE BY MOUTH  DAILY   OneTouch Delica Lancets 25D MISC Use as directed to check blood sugars 2 times per day dx: e11.65   pravastatin (PRAVACHOL) 40 MG tablet Take 1 tablet (40 mg total) by mouth daily.   telmisartan (MICARDIS) 80 MG tablet TAKE 1 TABLET BY MOUTH  DAILY   timolol (BETIMOL) 0.25 % ophthalmic solution Place 1 drop into the right eye 2 (two) times daily.   triamcinolone (KENALOG) 0.1 % APPLY TO AFFECTED AREA TWICE A DAY   No facility-administered encounter medications on file as of 06/04/2021.    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   Primary osteoarthritis of both knees 02/21/2018   Class 3 severe obesity due to excess calories with serious comorbidity and body mass index (BMI) of 45.0 to 49.9 in adult [facility]) 02/21/2018   Skin ulcer of right foot with fat layer exposed ([place]) 02/18/2018  Morbid (severe) obesity due to excess calories ([place]) 11/18/2017   Knee pain 07/16/2015   Lateral dislocation of right patella 05/04/2015   Rupture of right quadriceps tendon 05/24/2014   S/P TKR (total knee replacement) 05/05/2012    Conditions to be addressed/monitored: DMII, CKD Stage III, and Parenchymal Renal HTN  Care Plan : Social Work Care Plan  Updates made by [name] since 06/04/2021 12:00 AM  Completed 06/04/2021   Problem: Care  Coordination Resolved 06/04/2021     Long-Range Goal: Identify resources to assist with care coordination needs Completed 06/04/2021  Start Date: 02/15/2020  Recent Progress: On track  Priority: Low  Note:   Current Barriers:  Limited social support Transportation ADL IADL limitations Chronic conditions including DM II, CKD III, and obesity which put patient at increased risk for hospitalization   Social Work Clinical Goal(s):  patient will work with SW to address concerns related to care coordination  Interventions: 1:1 collaboration with Mary Chard, MD regarding development and update of comprehensive plan of care as evidenced by provider attestation and co-signature Inter-disciplinary care team collaboration (see longitudinal plan of care) Telephonic visit completed with the patient to assess for care coordination needs Determined the patient plans to enroll with [facility] beginning June 1  Discussed plans for RN Care Manager to follow up with the patient as planned June 5th to confirm enrollment successful  SW to sign off at this time but has encouraged the patient to contact SW as needed prior to enrollment with [facility] Collaboration with Dr. Baird Cancer and RN Care Manager to advise of patients plan to enroll in [facility]  Patient Goals/Self-Care Activities  patient will:   - Engage with [facility] to complete enrollment        Follow Up Plan:  No SW follow up planned at this time. The patient is to contact SW as needed prior to enrollment in the [facility] program.      [name], BSW, CDP Social Worker, Certified Dementia Practitioner [place] [facility] [place] [facility] [phone]

## 2021-06-04 NOTE — Patient Instructions (Signed)
Social Worker Visit Information  Goals we discussed today:   Goals Addressed             This Visit's Progress    COMPLETED: work with SW to manage care coordination needs       Timeframe:  Long-Range Goal Priority:  Low Start Date:  2.1.22                            Patient Goals/Self-Care Activities  patient will:   - Engage with [facility] to complete enrollment          Patient verbalizes understanding of instructions and care plan provided today and agrees to view in [place]. Active MyChart status and patient understanding of how to access instructions and care plan via MyChart confirmed with patient.     Follow Up Plan:  No follow up planned at this time. Please contact me as needed.   [name], BSW, CDP Social Worker, Certified Dementia Practitioner [place] [facility] [place] [facility] [phone]

## 2021-06-05 DIAGNOSIS — M1711 Unilateral primary osteoarthritis, right knee: Secondary | ICD-10-CM | POA: Diagnosis not present

## 2021-06-05 DIAGNOSIS — N183 Chronic kidney disease, stage 3 unspecified: Secondary | ICD-10-CM | POA: Diagnosis not present

## 2021-06-05 DIAGNOSIS — E1122 Type 2 diabetes mellitus with diabetic chronic kidney disease: Secondary | ICD-10-CM | POA: Diagnosis not present

## 2021-06-05 DIAGNOSIS — D649 Anemia, unspecified: Secondary | ICD-10-CM | POA: Diagnosis not present

## 2021-06-05 DIAGNOSIS — B351 Tinea unguium: Secondary | ICD-10-CM | POA: Diagnosis not present

## 2021-06-05 DIAGNOSIS — I129 Hypertensive chronic kidney disease with stage 1 through stage 4 chronic kidney disease, or unspecified chronic kidney disease: Secondary | ICD-10-CM | POA: Diagnosis not present

## 2021-06-05 DIAGNOSIS — M1712 Unilateral primary osteoarthritis, left knee: Secondary | ICD-10-CM | POA: Diagnosis not present

## 2021-06-05 DIAGNOSIS — I890 Lymphedema, not elsewhere classified: Secondary | ICD-10-CM | POA: Diagnosis not present

## 2021-06-05 DIAGNOSIS — E0840 Diabetes mellitus due to underlying condition with diabetic neuropathy, unspecified: Secondary | ICD-10-CM | POA: Diagnosis not present

## 2021-06-05 DIAGNOSIS — L97511 Non-pressure chronic ulcer of other part of right foot limited to breakdown of skin: Secondary | ICD-10-CM | POA: Diagnosis not present

## 2021-06-13 DIAGNOSIS — D649 Anemia, unspecified: Secondary | ICD-10-CM | POA: Diagnosis not present

## 2021-06-13 DIAGNOSIS — N183 Chronic kidney disease, stage 3 unspecified: Secondary | ICD-10-CM

## 2021-06-13 DIAGNOSIS — M25512 Pain in left shoulder: Secondary | ICD-10-CM | POA: Diagnosis not present

## 2021-06-13 DIAGNOSIS — M1711 Unilateral primary osteoarthritis, right knee: Secondary | ICD-10-CM | POA: Diagnosis not present

## 2021-06-13 DIAGNOSIS — I129 Hypertensive chronic kidney disease with stage 1 through stage 4 chronic kidney disease, or unspecified chronic kidney disease: Secondary | ICD-10-CM

## 2021-06-13 DIAGNOSIS — I890 Lymphedema, not elsewhere classified: Secondary | ICD-10-CM | POA: Diagnosis not present

## 2021-06-13 DIAGNOSIS — E1122 Type 2 diabetes mellitus with diabetic chronic kidney disease: Secondary | ICD-10-CM

## 2021-06-13 DIAGNOSIS — M1712 Unilateral primary osteoarthritis, left knee: Secondary | ICD-10-CM | POA: Diagnosis not present

## 2021-06-13 DIAGNOSIS — M25561 Pain in right knee: Secondary | ICD-10-CM | POA: Diagnosis not present

## 2021-06-13 DIAGNOSIS — M150 Primary generalized (osteo)arthritis: Secondary | ICD-10-CM | POA: Diagnosis not present

## 2021-06-13 DIAGNOSIS — G894 Chronic pain syndrome: Secondary | ICD-10-CM | POA: Diagnosis not present

## 2021-06-13 DIAGNOSIS — N1831 Chronic kidney disease, stage 3a: Secondary | ICD-10-CM

## 2021-06-13 DIAGNOSIS — E0840 Diabetes mellitus due to underlying condition with diabetic neuropathy, unspecified: Secondary | ICD-10-CM | POA: Diagnosis not present

## 2021-06-13 DIAGNOSIS — B351 Tinea unguium: Secondary | ICD-10-CM | POA: Diagnosis not present

## 2021-06-13 DIAGNOSIS — L97511 Non-pressure chronic ulcer of other part of right foot limited to breakdown of skin: Secondary | ICD-10-CM | POA: Diagnosis not present

## 2021-06-18 ENCOUNTER — Telehealth: Payer: Medicare Other

## 2021-06-18 ENCOUNTER — Ambulatory Visit: Payer: Self-pay

## 2021-06-18 DIAGNOSIS — I890 Lymphedema, not elsewhere classified: Secondary | ICD-10-CM

## 2021-06-18 DIAGNOSIS — I129 Hypertensive chronic kidney disease with stage 1 through stage 4 chronic kidney disease, or unspecified chronic kidney disease: Secondary | ICD-10-CM

## 2021-06-18 DIAGNOSIS — E039 Hypothyroidism, unspecified: Secondary | ICD-10-CM

## 2021-06-18 DIAGNOSIS — N183 Chronic kidney disease, stage 3 unspecified: Secondary | ICD-10-CM

## 2021-06-18 DIAGNOSIS — N1831 Chronic kidney disease, stage 3a: Secondary | ICD-10-CM

## 2021-06-18 NOTE — Chronic Care Management (AMB) (Signed)
  Care Management   Follow Up Note   06/18/2021 Name: Mary [name] MRN: [id number] DOB: [birth year]   Referred by: No primary care provider on file. Reason for referral : Chronic Care Management (RN CM Follow up call /Case Closure)  Successful contact was made with patient for an RN Care Manager follow up. Patient advised she has transitioned over to [facility] to provide her primary care needs. PCP provider Dr. Dorothyann Peng is aware of the transition.   Follow Up Plan: No further follow up required  Delsa Sale, RN, BSN, CCM Care Management Coordinator [facility] Care Management/[facility]  Direct Phone: [phone]

## 2021-06-19 NOTE — Progress Notes (Shared)
[facility] Note  06/22/2021     CHIEF COMPLAINT Patient presents for No chief complaint on file.    HISTORY OF PRESENT ILLNESS: Mary [name] is a 76 y.o. female who presents to the clinic today for:     Pt states Dr. Lucianne Lei wanted her to go to [facility] for glaucoma, but pt does not have transportation there, pt states she has not seen Dr. Lucianne Lei yet and cannot get an appt until September  Referring physician: Glendale Chard, MD [address] [place],  [place] [address]  HISTORICAL INFORMATION:   Selected notes from the [facility] Transferred care from Dr. Zigmund Daniel   CURRENT MEDICATIONS: Current Outpatient Medications (Ophthalmic Drugs)  Medication Sig   brimonidine (ALPHAGAN) 0.2 % ophthalmic solution Place 1 drop into both eyes 2 (two) times daily.   timolol (BETIMOL) 0.25 % ophthalmic solution Place 1 drop into the right eye 2 (two) times daily.   No current facility-administered medications for this visit. (Ophthalmic Drugs)   Current Outpatient Medications (Other)  Medication Sig   aspirin 81 MG chewable tablet 1 tablet   Assure Comfort Lancets 28G MISC    Blood Glucose Monitoring Suppl (ONETOUCH VERIO FLEX SYSTEM) w/Device KIT Use as directed to check blood sugars 2 times per day dx: e11.65   cetirizine (ZYRTEC) 10 MG tablet Take 1 tablet (10 mg total) by mouth daily.   fluticasone (FLONASE) 50 MCG/ACT nasal spray SPRAY 1 SPRAY INTO EACH NOSTRIL EVERY DAY   furosemide (LASIX) 40 MG tablet TAKE 1 TABLET BY MOUTH  DAILY   glucose blood (ONETOUCH VERIO) test strip Use as directed to check blood sugars 2 times per day dx: e11.65   HYDROcodone-acetaminophen (NORCO) 10-325 MG tablet Take 1 tablet by mouth 5 (five) times daily as needed.   levothyroxine (SYNTHROID) 125 MCG tablet Take 125 mcg by mouth daily before breakfast.   metoprolol succinate (TOPROL-XL) 50 MG 24 hr tablet TAKE 1 TABLET BY MOUTH  DAILY   omeprazole  (PRILOSEC) 20 MG capsule TAKE 1 CAPSULE BY MOUTH  DAILY   OneTouch Delica Lancets 60A MISC Use as directed to check blood sugars 2 times per day dx: e11.65   pravastatin (PRAVACHOL) 40 MG tablet Take 1 tablet (40 mg total) by mouth daily.   telmisartan (MICARDIS) 80 MG tablet TAKE 1 TABLET BY MOUTH  DAILY   triamcinolone (KENALOG) 0.1 % APPLY TO AFFECTED AREA TWICE A DAY   No current facility-administered medications for this visit. (Other)   REVIEW OF SYSTEMS:   ALLERGIES No Known Allergies  PAST MEDICAL HISTORY Past Medical History:  Diagnosis Date   Arthritis    osteoarthritis   Cataract    OS   Diabetes mellitus    Insulin dependent   Diabetic retinopathy ([place])    NPDR OU   Glaucoma    POAG OD   H/O cardiovascular stress test    pt. reports 10/13- was normal per pt.    Hyperlipemia    Hypertension    Hypertensive retinopathy    OU   Past Surgical History:  Procedure Laterality Date   CARPAL TUNNEL RELEASE Bilateral    CATARACT EXTRACTION Right 10/05/2018   Dr. Kathlen Mody   EYE SURGERY Right    Cat Sx   KNEE ARTHROSCOPY     right   QUADRICEPS TENDON REPAIR  02/17/2012   Procedure: REPAIR QUADRICEP TENDON;  Surgeon: Vickey Huger, MD;  Location: [facility];  Service: Orthopedics;  Laterality: Right;  right quadriceps repair with latera release   QUADRICEPS TENDON REPAIR Right 05/15/2012   Procedure: REPAIR QUADRICEP TENDON;  Surgeon: Vickey Huger, MD;  Location: [facility];  Service: Orthopedics;  Laterality: Right;   TOTAL KNEE ARTHROPLASTY  10/21/2011   Procedure: TOTAL KNEE ARTHROPLASTY;  Surgeon: Rudean Haskell, MD;  Location: [facility];  Service: Orthopedics;  Laterality: Right;   TUBAL LIGATION     FAMILY HISTORY Family History  Problem Relation Age of Onset   Hypertension Mother    Heart Problems Father    Gout Father    Arthritis Father    SOCIAL HISTORY Social History   Tobacco Use   Smoking status: Former    Packs/day: 1.00    Years: 30.00    Pack years:  30.00    Types: Cigarettes    Start date: 01/15/1967    Quit date: 06/28/2014    Years since quitting: 6.9   Smokeless tobacco: Never  Vaping Use   Vaping Use: Never used  Substance Use Topics   Alcohol use: No   Drug use: Yes    Types: Hydrocodone       OPHTHALMIC EXAM: Not recorded    IMAGING AND PROCEDURES  Imaging and Procedures for _0 @          ASSESSMENT/PLAN:    ICD-10-CM   1. Central retinal vein occlusion with macular edema of both eyes  H34.8130     2. Severe nonproliferative diabetic retinopathy of both eyes with macular edema associated with type 2 diabetes mellitus ([place])  Q76.1950     3. Essential hypertension  I10     4. Hypertensive retinopathy of both eyes  H35.033     5. 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 Pseudophakia  Z96.1     7. Primary open angle glaucoma (POAG) of right eye, mild stage  H40.1111       1. CRVO w/ CME OU  - pt of Dr. Zigmund Daniel who wished to transfer care  - OS long-standing low vision (CF) with subfoveal scar and central retinal atrophy  - OD with CME actively being managed with Ozurdex OD ~q5 wks by Dr. Zigmund Daniel (last on 8.20.20) s/p multiple IVTA and Ozurdex OD  - was due for Ozurdex OD w/ Zigmund Daniel on 9.25.20, but underwent cataract surgery OD w/ Kathlen Mody on 9.21.20  - s/p IVTA OD #4 (10.26.20), #5 (12.10.20), #6 (01.21.21), #7 (03.05.21), #8 (04.16.21), #9 (05.28.21), #10 (07.09.21), #11 (09.14.21), #12 (10.29.21), #13 (12.17.21), #14 (01.27.22), #15 (03.11.2022), #16 (05.04.22), #17 (06.17.22), #18 (07.29.22), #19 (09.16.22), #20 (10.28.22), #21 (12.16.22), #22 (02.03.23), #23 (04.07.23)  - BCVA improved to 20/60 from 20/70  - exam shows no residual IVTA in vitreous cavity  - OCT OD shows stable improvement in temporal IRF/IRHM, trace vitreous opacities at 8 wks -- almost resolved; OS - stable from prior  - recommend IVTA OD #24 today, 06.09.23 w/ f/u at 8-9 wks  - pt wishes to proceed  -  RBA of procedure discussed, questions answered  - informed consent obtained  - see procedure note -- AC tap  - f/u in 8-9 wks -- DFE/OCT/possible injection  2. Severe Non-proliferative diabetic retinopathy OU.  - OCT shows diabetic macular edema, OD   - recommend IVTA OD today as above  3,4. Hypertensive retinopathy OU  - discussed importance of tight BP control  - monitor    5. Mixed form age related cataract OS  -  under the expert management of Dr. Kathlen Mody  6. Pseudophakia OD  - s/p CE/IOL OD (9.21.20) by expert surgeon, Dr. Kathlen Mody  - IOL in excellent position  - had post op IOP spike -- IOP now under better control  - monitor    7. POAG OD  - was under the expert management of Dr. Kathlen Mody  - s/p SLT and goniotomy OD  - had post-cataract IOP spike  - on brimonidine BID OU  - IOP 16 today  - AC taps post IVTA injection   Ophthalmic Meds Ordered this visit:  No orders of the defined types were placed in this encounter.    No follow-ups on file.  There are no Patient Instructions on file for this visit.   This document serves as a record of services personally performed by Gardiner Sleeper, MD, PhD. It was created on their behalf by Leonie Douglas, an ophthalmic technician. The creation of this record is the provider's dictation and/or activities during the visit.    Electronically signed by: Leonie Douglas COA, 06/19/21  9:00 AM    Gardiner Sleeper, M.D., Ph.D. Diseases & Surgery of the Retina and Vitreous [facility]: M myopia (nearsighted); A astigmatism; H hyperopia (farsighted); P presbyopia; Mrx spectacle prescription;  CTL contact lenses; OD right eye; OS left eye; OU both eyes  XT exotropia; ET esotropia; PEK punctate epithelial keratitis; PEE punctate epithelial erosions; DES dry eye syndrome; MGD meibomian gland dysfunction; ATs artificial tears; PFAT's preservative free artificial tears; [place] nuclear sclerotic cataract; PSC  posterior subcapsular cataract; ERM epi-retinal membrane; PVD posterior vitreous detachment; RD retinal detachment; DM diabetes mellitus; DR diabetic retinopathy; NPDR non-proliferative diabetic retinopathy; PDR proliferative diabetic retinopathy; CSME clinically significant macular edema; DME diabetic macular edema; dbh dot blot hemorrhages; CWS cotton wool spot; POAG primary open angle glaucoma; C/D cup-to-disc ratio; HVF humphrey visual field; GVF goldmann visual field; OCT optical coherence tomography; IOP intraocular pressure; BRVO Branch retinal vein occlusion; CRVO central retinal vein occlusion; CRAO central retinal artery occlusion; BRAO branch retinal artery occlusion; RT retinal tear; SB scleral buckle; PPV pars plana vitrectomy; VH Vitreous hemorrhage; PRP panretinal laser photocoagulation; IVK intravitreal kenalog; VMT vitreomacular traction; MH Macular hole;  NVD neovascularization of the disc; NVE neovascularization elsewhere; AREDS age related eye disease study; ARMD age related macular degeneration; POAG primary open angle glaucoma; EBMD epithelial/anterior basement membrane dystrophy; ACIOL anterior chamber intraocular lens; IOL intraocular lens; PCIOL posterior chamber intraocular lens; Phaco/IOL phacoemulsification with intraocular lens placement; [place] photorefractive keratectomy; LASIK laser assisted in situ keratomileusis; HTN hypertension; DM diabetes mellitus; COPD chronic obstructive pulmonary disease

## 2021-06-21 ENCOUNTER — Other Ambulatory Visit (INDEPENDENT_AMBULATORY_CARE_PROVIDER_SITE_OTHER): Payer: Medicare (Managed Care) | Admitting: Internal Medicine

## 2021-06-21 DIAGNOSIS — B351 Tinea unguium: Secondary | ICD-10-CM | POA: Diagnosis not present

## 2021-06-21 DIAGNOSIS — E1122 Type 2 diabetes mellitus with diabetic chronic kidney disease: Secondary | ICD-10-CM

## 2021-06-21 DIAGNOSIS — L97511 Non-pressure chronic ulcer of other part of right foot limited to breakdown of skin: Secondary | ICD-10-CM | POA: Diagnosis not present

## 2021-06-21 DIAGNOSIS — L84 Corns and callosities: Secondary | ICD-10-CM

## 2021-06-21 DIAGNOSIS — E0840 Diabetes mellitus due to underlying condition with diabetic neuropathy, unspecified: Secondary | ICD-10-CM

## 2021-06-21 DIAGNOSIS — I129 Hypertensive chronic kidney disease with stage 1 through stage 4 chronic kidney disease, or unspecified chronic kidney disease: Secondary | ICD-10-CM | POA: Diagnosis not present

## 2021-06-21 DIAGNOSIS — Z7982 Long term (current) use of aspirin: Secondary | ICD-10-CM

## 2021-06-21 DIAGNOSIS — D649 Anemia, unspecified: Secondary | ICD-10-CM

## 2021-06-21 DIAGNOSIS — N183 Chronic kidney disease, stage 3 unspecified: Secondary | ICD-10-CM

## 2021-06-21 NOTE — Progress Notes (Signed)
Received home health orders orders from [facility]. Start of care 03/06/2021.   Certification and orders from 05/05/21 through 07/03/21 are reviewed, signed and faxed back to home health company.(Faxed on 05/09/21)  Need of intermittent skilled services at home: Skilled Nursing/wound care  The home health care plan has been established by me and will be reviewed and updated as needed to maximize patient recovery.  I certify that all home health services have been and will be furnished to the patient while under my care.  Face-to-face encounter in which the need for home health services was established: 12/12/20  Patient is receiving home health services for the following diagnoses: Problem List Items Addressed This Visit       Cardiovascular and Mediastinum   Parenchymal renal hypertension     Endocrine   Diabetes mellitus with stage 3 chronic kidney disease ([place])   Other Visit Diagnoses     Right foot ulcer, limited to breakdown of skin ([place])    -  Primary   Diabetes mellitus due to underlying condition with diabetic neuropathy, without long-term current use of insulin ([place])       Anemia, unspecified type       Dermatophytosis of nail       Corns and callosities       Morbid obesity ([place])       Encounter for long-term (current) use of aspirin            Maximino Greenland, MD

## 2021-06-22 ENCOUNTER — Encounter (INDEPENDENT_AMBULATORY_CARE_PROVIDER_SITE_OTHER): Payer: Medicare Other | Admitting: Ophthalmology

## 2021-06-22 DIAGNOSIS — Z961 Presence of intraocular lens: Secondary | ICD-10-CM

## 2021-06-22 DIAGNOSIS — H25812 Combined forms of age-related cataract, left eye: Secondary | ICD-10-CM

## 2021-06-22 DIAGNOSIS — E113413 Type 2 diabetes mellitus with severe nonproliferative diabetic retinopathy with macular edema, bilateral: Secondary | ICD-10-CM

## 2021-06-22 DIAGNOSIS — H34813 Central retinal vein occlusion, bilateral, with macular edema: Secondary | ICD-10-CM

## 2021-06-22 DIAGNOSIS — I10 Essential (primary) hypertension: Secondary | ICD-10-CM

## 2021-06-22 DIAGNOSIS — H401111 Primary open-angle glaucoma, right eye, mild stage: Secondary | ICD-10-CM

## 2021-06-22 DIAGNOSIS — H35033 Hypertensive retinopathy, bilateral: Secondary | ICD-10-CM

## 2021-06-27 ENCOUNTER — Encounter (INDEPENDENT_AMBULATORY_CARE_PROVIDER_SITE_OTHER): Payer: Self-pay | Admitting: Ophthalmology

## 2021-06-27 ENCOUNTER — Ambulatory Visit (INDEPENDENT_AMBULATORY_CARE_PROVIDER_SITE_OTHER): Payer: Medicare (Managed Care) | Admitting: Ophthalmology

## 2021-06-27 DIAGNOSIS — H401111 Primary open-angle glaucoma, right eye, mild stage: Secondary | ICD-10-CM

## 2021-06-27 DIAGNOSIS — E113413 Type 2 diabetes mellitus with severe nonproliferative diabetic retinopathy with macular edema, bilateral: Secondary | ICD-10-CM | POA: Diagnosis not present

## 2021-06-27 DIAGNOSIS — I10 Essential (primary) hypertension: Secondary | ICD-10-CM

## 2021-06-27 DIAGNOSIS — H25812 Combined forms of age-related cataract, left eye: Secondary | ICD-10-CM

## 2021-06-27 DIAGNOSIS — Z961 Presence of intraocular lens: Secondary | ICD-10-CM

## 2021-06-27 DIAGNOSIS — H34813 Central retinal vein occlusion, bilateral, with macular edema: Secondary | ICD-10-CM

## 2021-06-27 DIAGNOSIS — H35033 Hypertensive retinopathy, bilateral: Secondary | ICD-10-CM

## 2021-06-27 NOTE — Progress Notes (Addendum)
[facility] Note  06/27/2021     CHIEF COMPLAINT Patient presents for Retina Follow Up    HISTORY OF PRESENT ILLNESS: Mary [name] is a 76 y.o. female who presents to the clinic today for:   HPI     Retina Follow Up   Patient presents with  CRVO/BRVO.  In both eyes.  Severity is moderate.  Duration of 10 weeks.  Since onset it is gradually worsening.  I, the attending physician,  performed the HPI with the patient and updated documentation appropriately.        Comments   Pt here for 10 wk ret f/u. Pt states she feels VA has worsened since previous visit in OD.       Last edited by Bernarda Caffey, MD on 06/29/2021 12:17 PM.    Patient states vision is dark and cloudy OD for the past 3 weeks. Has been referred to glaucoma specialist at [facility]   Referring physician: Glendale Chard, [address] [place],  [place] [address]  HISTORICAL INFORMATION:   Selected notes from the [facility] Transferred care from Dr. Zigmund Daniel   CURRENT MEDICATIONS: Current Outpatient Medications (Ophthalmic Drugs)  Medication Sig   brimonidine (ALPHAGAN) 0.2 % ophthalmic solution Place 1 drop into both eyes 2 (two) times daily.   timolol (BETIMOL) 0.25 % ophthalmic solution Place 1 drop into the right eye 2 (two) times daily.   No current facility-administered medications for this visit. (Ophthalmic Drugs)   Current Outpatient Medications (Other)  Medication Sig   aspirin 81 MG chewable tablet 1 tablet   Assure Comfort Lancets 28G MISC    Blood Glucose Monitoring Suppl (ONETOUCH VERIO FLEX SYSTEM) w/Device KIT Use as directed to check blood sugars 2 times per day dx: e11.65   cetirizine (ZYRTEC) 10 MG tablet Take 1 tablet (10 mg total) by mouth daily.   fluticasone (FLONASE) 50 MCG/ACT nasal spray SPRAY 1 SPRAY INTO EACH NOSTRIL EVERY DAY   furosemide (LASIX) 40 MG tablet TAKE 1 TABLET BY MOUTH  DAILY   glucose blood (ONETOUCH VERIO)  test strip Use as directed to check blood sugars 2 times per day dx: e11.65   HYDROcodone-acetaminophen (NORCO) 10-325 MG tablet Take 1 tablet by mouth 5 (five) times daily as needed.   levothyroxine (SYNTHROID) 125 MCG tablet Take 125 mcg by mouth daily before breakfast.   metoprolol succinate (TOPROL-XL) 50 MG 24 hr tablet TAKE 1 TABLET BY MOUTH  DAILY   omeprazole (PRILOSEC) 20 MG capsule TAKE 1 CAPSULE BY MOUTH  DAILY   OneTouch Delica Lancets 24M MISC Use as directed to check blood sugars 2 times per day dx: e11.65   pravastatin (PRAVACHOL) 40 MG tablet Take 1 tablet (40 mg total) by mouth daily.   telmisartan (MICARDIS) 80 MG tablet TAKE 1 TABLET BY MOUTH  DAILY   triamcinolone (KENALOG) 0.1 % APPLY TO AFFECTED AREA TWICE A DAY   No current facility-administered medications for this visit. (Other)   REVIEW OF SYSTEMS: ROS   Positive for: Genitourinary, Musculoskeletal, Endocrine, Eyes Negative for: Constitutional, Gastrointestinal, Neurological, Skin, HENT, Cardiovascular, Respiratory, Psychiatric, Allergic/Imm, Heme/Lymph Last edited by Kingsley Spittle, COT on 06/27/2021  9:44 AM.     ALLERGIES No Known Allergies  PAST MEDICAL HISTORY Past Medical History:  Diagnosis Date   Arthritis    osteoarthritis   Cataract    OS   Diabetes mellitus    Insulin dependent   Diabetic retinopathy ([place])  NPDR OU   Glaucoma    POAG OD   H/O cardiovascular stress test    pt. reports 10/13- was normal per pt.    Hyperlipemia    Hypertension    Hypertensive retinopathy    OU   Past Surgical History:  Procedure Laterality Date   CARPAL TUNNEL RELEASE Bilateral    CATARACT EXTRACTION Right 10/05/2018   Dr. Kathlen Mody   EYE SURGERY Right    Cat Sx   KNEE ARTHROSCOPY     right   QUADRICEPS TENDON REPAIR  02/17/2012   Procedure: REPAIR QUADRICEP TENDON;  Surgeon: Vickey Huger, MD;  Location: [facility];  Service: Orthopedics;  Laterality: Right;  right quadriceps repair with latera  release   QUADRICEPS TENDON REPAIR Right 05/15/2012   Procedure: REPAIR QUADRICEP TENDON;  Surgeon: Vickey Huger, MD;  Location: [facility];  Service: Orthopedics;  Laterality: Right;   TOTAL KNEE ARTHROPLASTY  10/21/2011   Procedure: TOTAL KNEE ARTHROPLASTY;  Surgeon: Rudean Haskell, MD;  Location: [facility];  Service: Orthopedics;  Laterality: Right;   TUBAL LIGATION     FAMILY HISTORY Family History  Problem Relation Age of Onset   Hypertension Mother    Heart Problems Father    Gout Father    Arthritis Father    SOCIAL HISTORY Social History   Tobacco Use   Smoking status: Former    Packs/day: 1.00    Years: 30.00    Total pack years: 30.00    Types: Cigarettes    Start date: 01/15/1967    Quit date: 06/28/2014    Years since quitting: 7.0   Smokeless tobacco: Never  Vaping Use   Vaping Use: Never used  Substance Use Topics   Alcohol use: No   Drug use: Yes    Types: Hydrocodone       OPHTHALMIC EXAM: Base Eye Exam     Visual Acuity (Snellen - Linear)       Right Left   Dist [place] 20/80 HM   Dist ph [place] 20/70 NI         Tonometry (Tonopen, 9:51 AM)       Right Left   Pressure 13 17         Pupils       Pupils Dark Light Shape React APD   Right PERRL 3 2 Round Brisk None   Left PERRL 2 2 Round Minimal None         Visual Fields (Counting fingers)       Left Right     Full   Restrictions Total superior temporal, inferior temporal, superior nasal, inferior nasal deficiencies          Extraocular Movement       Right Left    Full LxT    -- -- --  --  --  -- -- --   -- -- --  --  --  -- -- --           Neuro/Psych     Oriented x3: Yes   Mood/Affect: Normal         Dilation     Both eyes: 1.0% Mydriacyl, 2.5% Phenylephrine @ 9:52 AM           Slit Lamp and Fundus Exam     Slit Lamp Exam       Right Left   Lids/Lashes Dermatochalasis - upper lid, mild Meibomian gland dysfunction Dermatochalasis - upper lid, mild Meibomian  gland dysfunction   Conjunctiva/Sclera  Melanosis Melanosis   Cornea Trace, Punctate epithelial erosions, well healed temporal cataract wounds, arcus Mild arcus, trace PEE   Anterior Chamber Deep and quiet Deep and quiet   Iris Round and moderately dilated to 5.9m Round and moderately dilated to 632m  Lens PC IOL in good position, trace, cortical remnant temporally, PC folds 3+ Nuclear sclerosis with brunescense, 3+ Cortical cataract   Anterior Vitreous Vitreous syneresis, vitreous condensations, Ozurdex pellet remnants inferiorly, no residual IVT Vitreous syneresis         Fundus Exam       Right Left   Disc 2-3+Pallor, Sharp rim, temporal PPP/PPA, thin superior rim, +cupping Mild Pallor, Sharp rim, PPP, severely attenuated vessels   C/D Ratio 0.7 0.3   Macula Blunted foveal reflex, [place], Retinal pigment epithelial mottling, temporal cystic changes with exudates -- improved, punctate MA's and exudates greatest temporal macula Flat, Blunted foveal reflex, RPE mottling and clumping, scarring, atrophy, no heme   Vessels attenuated, Tortuous Severe Vascular attenuation, +fibrosis   Periphery Attached, scattered 360 MA Attached, pavingstone and reticular degeneration inferiorly, scattered RPE atrophy, greatest peripapillary             IMAGING AND PROCEDURES  Imaging and Procedures for @TODAY @  OCT, Retina - OU - Both Eyes       Right Eye Quality was good. Central Foveal Thickness: 183. Progression has been stable. Findings include no IRF, no SRF, abnormal foveal contour, intraretinal hyper-reflective material, vitreomacular adhesion (Stable improvement in temporal cystic changes / IRF, trace vitreous opacities).   Left Eye Quality was good. Central Foveal Thickness: 246. Progression has been stable. Findings include no SRF, abnormal foveal contour, subretinal hyper-reflective material, epiretinal membrane, intraretinal fluid, pigment epithelial detachment, outer retinal atrophy,  preretinal fibrosis (Persistent IRF/cystic changes).   Notes *Images captured and stored on drive  Diagnosis / Impression:  OD: Stable improvement in temporal cystic changes / IRF, trace vitreous opacities OS: persistent IRF/cystic changes  Clinical management:  See below  Abbreviations: NFP - Normal foveal profile. CME - cystoid macular edema. PED - pigment epithelial detachment. IRF - intraretinal fluid. SRF - subretinal fluid. EZ - ellipsoid zone. ERM - epiretinal membrane. ORA - outer retinal atrophy. ORT - outer retinal tubulation. SRHM - subretinal hyper-reflective material      Intravitreal Injection, Pharmacologic Agent - OD - Right Eye       Time Out 06/27/2021. 11:21 AM. Confirmed correct patient, procedure, site, and patient consented.   Anesthesia Topical anesthesia was used. Anesthetic medications included Lidocaine 2%, Proparacaine 0.5%.   Procedure Preparation included 5% betadine to ocular surface, eyelid speculum. A 27 gauge needle was used.   Injection: 4 mg triamcinolone acetonide 40 MG/ML   Route: Intravitreal, Site: Right Eye   NDC: [id number]: [id number] date: 12/14/2022   Post-op Post injection exam found visual acuity of at least counting fingers, no retinal detachment, perfused optic nerve. The patient tolerated the procedure well. There were no complications. The patient received written and verbal post procedure care education.   Notes An AC tap was performed following injection due to elevated IOP using a 30 gauge needle on a syringe with the plunger removed. The needle was placed at the limbus at 7 oclock and approximately 0.07 cc of aqueous was removed from the anterior chamber. Betadine was applied to the tap area before and after the paracentesis was performed. There were no complications. The patient tolerated the procedure well. The IOP was rechecked and was found to be ~  10 mmHg by palpation.           ASSESSMENT/PLAN:     ICD-10-CM   1. Central retinal vein occlusion with macular edema of both eyes  H34.8130 OCT, Retina - OU - Both Eyes    triamcinolone acetonide (TRIESENCE) 40 MG/ML subtenons injection 4 mg    Intravitreal Injection, Pharmacologic Agent - OD - Right Eye    triamcinolone acetonide (KENALOG-40) injection 4 mg    CANCELED: Intravitreal Injection, Pharmacologic Agent - OD - Right Eye    2. Severe nonproliferative diabetic retinopathy of both eyes with macular edema associated with type 2 diabetes mellitus ([place])  Q30.0923     3. Essential hypertension  I10     4. Hypertensive retinopathy of both eyes  H35.033     5. 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 Pseudophakia  Z96.1     7. Primary open angle glaucoma (POAG) of right eye, mild stage  H40.1111       1. CRVO w/ CME OU  - pt of Dr. Zigmund Daniel who wished to transfer care  - OS long-standing low vision (CF) with subfoveal scar and central retinal atrophy  - OD with CME actively being managed with Ozurdex OD ~q5 wks by Dr. Zigmund Daniel (last on 8.20.20) s/p multiple IVTA and Ozurdex OD  - was due for Ozurdex OD w/ Zigmund Daniel on 9.25.20, but underwent cataract surgery OD w/ Kathlen Mody on 9.21.20  - s/p IVTA OD #4 (10.26.20), #5 (12.10.20), #6 (01.21.21), #7 (03.05.21), #8 (04.16.21), #9 (05.28.21), #10 (07.09.21), #11 (09.14.21), #12 (10.29.21), #13 (12.17.21), #14 (01.27.22), #15 (03.11.2022), #16 (05.04.22), #17 (06.17.22), #18 (07.29.22), #19 (09.16.22), #20 (10.28.22), #21 (12.16.22), #22 (02.03.23) #23 (04.07.23)  - BCVA 20/80 from 20/70  - exam shows no residual IVTA in vitreous cavity  - OCT OD shows stable improvement in temporal IRF/IRHM, trace vitreous opacities at 9+ wks; OS - stable from prior  - recommend IVTA OD #24 today, 06.14.23 w/ f/u at 8-9 wks  - pt wishes to proceed  - RBA of procedure discussed, questions answered  - IVTA informed consent obtained and re-signed on 06.14.23  - see procedure note -- AC  tap  - f/u in 8-9 wks -- DFE/OCT/possible injection  2. Severe Non-proliferative diabetic retinopathy OU.  - OCT shows diabetic macular edema, OD   - recommend IVTA OD today, 06.14.23, as above  3,4. Hypertensive retinopathy OU  - discussed importance of tight BP control  - monitor    5. Mixed form age related cataract OS  - under the expert management of Dr. Kathlen Mody   6. Pseudophakia OD  - s/p CE/IOL OD (9.21.20) by expert surgeon, Dr. Kathlen Mody  - IOL in excellent position  - had post op IOP spike -- IOP now under better control  - monitor    7. POAG OD  - was under the expert management of Dr. Kathlen Mody  - s/p SLT and goniotomy OD  - had post-cataract IOP spike  - on brimonidine BID OU  - IOP 13 today  - AC taps post IVTA injection   - has been referred to Dr. Ander Slade  Ophthalmic Meds Ordered this visit:  Meds ordered this encounter  Medications   triamcinolone acetonide (TRIESENCE) 40 MG/ML subtenons injection 4 mg   triamcinolone acetonide (KENALOG-40) injection 4 mg     Return 8-9 weeks, for DFE, OCT.  There are no Patient Instructions on file for this visit.   This  document serves as a record of services personally performed by Gardiner Sleeper, MD, PhD. It was created on their behalf by Orvan Falconer, an ophthalmic technician. The creation of this record is the provider's dictation and/or activities during the visit.    Electronically signed by: Orvan Falconer, OA, 06/29/21  12:35 PM  This document serves as a record of services personally performed by Gardiner Sleeper, MD, PhD. It was created on their behalf by Roselee Nova, COMT. The creation of this record is the provider's dictation and/or activities during the visit.  Electronically signed by: Roselee Nova, COMT 06/29/21 12:35 PM  Gardiner Sleeper, M.D., Ph.D. Diseases & Surgery of the Retina and Vitreous [facility]  I have reviewed the above documentation for accuracy and completeness,  and I agree with the above. Gardiner Sleeper, M.D., Ph.D. 06/29/21 12:35 PM   Abbreviations: M myopia (nearsighted); A astigmatism; H hyperopia (farsighted); P presbyopia; Mrx spectacle prescription;  CTL contact lenses; OD right eye; OS left eye; OU both eyes  XT exotropia; ET esotropia; PEK punctate epithelial keratitis; PEE punctate epithelial erosions; DES dry eye syndrome; MGD meibomian gland dysfunction; ATs artificial tears; PFAT's preservative free artificial tears; [place] nuclear sclerotic cataract; PSC posterior subcapsular cataract; ERM epi-retinal membrane; PVD posterior vitreous detachment; RD retinal detachment; DM diabetes mellitus; DR diabetic retinopathy; NPDR non-proliferative diabetic retinopathy; PDR proliferative diabetic retinopathy; CSME clinically significant macular edema; DME diabetic macular edema; dbh dot blot hemorrhages; CWS cotton wool spot; POAG primary open angle glaucoma; C/D cup-to-disc ratio; HVF humphrey visual field; GVF goldmann visual field; OCT optical coherence tomography; IOP intraocular pressure; BRVO Branch retinal vein occlusion; CRVO central retinal vein occlusion; CRAO central retinal artery occlusion; BRAO branch retinal artery occlusion; RT retinal tear; SB scleral buckle; PPV pars plana vitrectomy; VH Vitreous hemorrhage; PRP panretinal laser photocoagulation; IVK intravitreal kenalog; VMT vitreomacular traction; MH Macular hole;  NVD neovascularization of the disc; NVE neovascularization elsewhere; AREDS age related eye disease study; ARMD age related macular degeneration; POAG primary open angle glaucoma; EBMD epithelial/anterior basement membrane dystrophy; ACIOL anterior chamber intraocular lens; IOL intraocular lens; PCIOL posterior chamber intraocular lens; Phaco/IOL phacoemulsification with intraocular lens placement; [place] photorefractive keratectomy; LASIK laser assisted in situ keratomileusis; HTN hypertension; DM diabetes mellitus; COPD chronic obstructive  pulmonary disease

## 2021-06-29 ENCOUNTER — Encounter (INDEPENDENT_AMBULATORY_CARE_PROVIDER_SITE_OTHER): Payer: Self-pay | Admitting: Ophthalmology

## 2021-06-29 DIAGNOSIS — H34813 Central retinal vein occlusion, bilateral, with macular edema: Secondary | ICD-10-CM | POA: Diagnosis not present

## 2021-06-29 MED ORDER — TRIAMCINOLONE ACETONIDE 40 MG/ML IO SUSP
4.0000 mg | INTRAOCULAR | Status: AC | PRN
  Administered 2021-06-29: 4 mg via INTRAVITREAL

## 2021-06-29 MED ORDER — TRIAMCINOLONE ACETONIDE 40 MG/ML IJ SUSP FOR KALEIDOSCOPE
4.0000 mg | INTRAMUSCULAR | Status: AC | PRN
  Administered 2021-06-29: 4 mg via INTRAVITREAL

## 2021-06-29 NOTE — Addendum Note (Signed)
Addended by: Karie Chimera on: 06/29/2021 12:35 PM   Modules accepted: Orders

## 2021-07-04 ENCOUNTER — Other Ambulatory Visit (INDEPENDENT_AMBULATORY_CARE_PROVIDER_SITE_OTHER): Payer: Self-pay | Admitting: Ophthalmology

## 2021-07-04 DIAGNOSIS — H401111 Primary open-angle glaucoma, right eye, mild stage: Secondary | ICD-10-CM

## 2021-07-05 ENCOUNTER — Other Ambulatory Visit: Payer: Self-pay | Admitting: Internal Medicine

## 2021-07-05 DIAGNOSIS — M810 Age-related osteoporosis without current pathological fracture: Secondary | ICD-10-CM

## 2021-07-05 DIAGNOSIS — Z1231 Encounter for screening mammogram for malignant neoplasm of breast: Secondary | ICD-10-CM

## 2021-07-23 ENCOUNTER — Ambulatory Visit (INDEPENDENT_AMBULATORY_CARE_PROVIDER_SITE_OTHER): Payer: Medicare (Managed Care) | Admitting: Ophthalmology

## 2021-07-23 ENCOUNTER — Encounter (INDEPENDENT_AMBULATORY_CARE_PROVIDER_SITE_OTHER): Payer: Self-pay | Admitting: Ophthalmology

## 2021-07-23 DIAGNOSIS — H35033 Hypertensive retinopathy, bilateral: Secondary | ICD-10-CM

## 2021-07-23 DIAGNOSIS — H34813 Central retinal vein occlusion, bilateral, with macular edema: Secondary | ICD-10-CM

## 2021-07-23 DIAGNOSIS — E113413 Type 2 diabetes mellitus with severe nonproliferative diabetic retinopathy with macular edema, bilateral: Secondary | ICD-10-CM | POA: Diagnosis not present

## 2021-07-23 DIAGNOSIS — H4311 Vitreous hemorrhage, right eye: Secondary | ICD-10-CM

## 2021-07-23 DIAGNOSIS — H401111 Primary open-angle glaucoma, right eye, mild stage: Secondary | ICD-10-CM

## 2021-07-23 DIAGNOSIS — H25812 Combined forms of age-related cataract, left eye: Secondary | ICD-10-CM

## 2021-07-23 DIAGNOSIS — Z961 Presence of intraocular lens: Secondary | ICD-10-CM

## 2021-07-23 DIAGNOSIS — I10 Essential (primary) hypertension: Secondary | ICD-10-CM | POA: Diagnosis not present

## 2021-07-23 NOTE — Progress Notes (Signed)
[facility] Note  07/23/2021     CHIEF COMPLAINT Patient presents for Retina Follow Up    HISTORY OF PRESENT ILLNESS: Mary [name] is a 76 y.o. female who presents to the clinic today for:   HPI     Retina Follow Up   Patient presents with  CRVO/BRVO.  In both eyes.  Severity is severe.  Duration of 4 weeks.  Since onset it is rapidly worsening.  I, the attending physician,  performed the HPI with the patient and updated documentation appropriately.        Comments   4 week Retina eval. Patient states vision is getting worse since last visit. Patient states few days after last appointment she noticed it was not getting any better. Things are dark      Last edited by Bernarda Caffey, MD on 07/23/2021  9:13 AM.    Pt presents acutely today for decreased VA since last injection, she states her vision has been cloudy and dark for the past month  Referring physician: No referring provider defined for this encounter.  HISTORICAL INFORMATION:   Selected notes from the MEDICAL RECORD NUMBER Transferred care from Dr. Zigmund Daniel   CURRENT MEDICATIONS: Current Outpatient Medications (Ophthalmic Drugs)  Medication Sig   brimonidine (ALPHAGAN) 0.2 % ophthalmic solution INSTILL 1 DROP INTO BOTH EYES TWICE A DAY   timolol (BETIMOL) 0.25 % ophthalmic solution Place 1 drop into the right eye 2 (two) times daily.   No current facility-administered medications for this visit. (Ophthalmic Drugs)   Current Outpatient Medications (Other)  Medication Sig   aspirin 81 MG chewable tablet 1 tablet   Assure Comfort Lancets 28G MISC    Blood Glucose Monitoring Suppl (ONETOUCH VERIO FLEX SYSTEM) w/Device KIT Use as directed to check blood sugars 2 times per day dx: e11.65   cetirizine (ZYRTEC) 10 MG tablet Take 1 tablet (10 mg total) by mouth daily.   fluticasone (FLONASE) 50 MCG/ACT nasal spray SPRAY 1 SPRAY INTO EACH NOSTRIL EVERY DAY   furosemide (LASIX) 40 MG  tablet TAKE 1 TABLET BY MOUTH  DAILY   glucose blood (ONETOUCH VERIO) test strip Use as directed to check blood sugars 2 times per day dx: e11.65   HYDROcodone-acetaminophen (NORCO) 10-325 MG tablet Take 1 tablet by mouth 5 (five) times daily as needed.   levothyroxine (SYNTHROID) 125 MCG tablet Take 125 mcg by mouth daily before breakfast.   metoprolol succinate (TOPROL-XL) 50 MG 24 hr tablet TAKE 1 TABLET BY MOUTH  DAILY   omeprazole (PRILOSEC) 20 MG capsule TAKE 1 CAPSULE BY MOUTH  DAILY   OneTouch Delica Lancets 80X MISC Use as directed to check blood sugars 2 times per day dx: e11.65   pravastatin (PRAVACHOL) 40 MG tablet Take 1 tablet (40 mg total) by mouth daily.   telmisartan (MICARDIS) 80 MG tablet TAKE 1 TABLET BY MOUTH  DAILY   triamcinolone (KENALOG) 0.1 % APPLY TO AFFECTED AREA TWICE A DAY   No current facility-administered medications for this visit. (Other)   REVIEW OF SYSTEMS: ROS   Positive for: Genitourinary, Musculoskeletal, Endocrine, Eyes Negative for: Constitutional, Gastrointestinal, Neurological, Skin, HENT, Cardiovascular, Respiratory, Psychiatric, Allergic/Imm, Heme/Lymph Last edited by Elmore Guise, COT on 07/23/2021  8:31 AM.     ALLERGIES No Known Allergies  PAST MEDICAL HISTORY Past Medical History:  Diagnosis Date   Arthritis    osteoarthritis   Cataract    OS   Diabetes mellitus  Insulin dependent   Diabetic retinopathy (HCC)    NPDR OU   Glaucoma    POAG OD   H/O cardiovascular stress test    pt. reports 10/13- was normal per pt.    Hyperlipemia    Hypertension    Hypertensive retinopathy    OU   Past Surgical History:  Procedure Laterality Date   CARPAL TUNNEL RELEASE Bilateral    CATARACT EXTRACTION Right 10/05/2018   Dr. Kathlen Mody   EYE SURGERY Right    Cat Sx   KNEE ARTHROSCOPY     right   QUADRICEPS TENDON REPAIR  02/17/2012   Procedure: REPAIR QUADRICEP TENDON;  Surgeon: Vickey Huger, MD;  Location: [place];  Service:  Orthopedics;  Laterality: Right;  right quadriceps repair with latera release   QUADRICEPS TENDON REPAIR Right 05/15/2012   Procedure: REPAIR QUADRICEP TENDON;  Surgeon: Vickey Huger, MD;  Location: [facility];  Service: Orthopedics;  Laterality: Right;   TOTAL KNEE ARTHROPLASTY  10/21/2011   Procedure: TOTAL KNEE ARTHROPLASTY;  Surgeon: Rudean Haskell, MD;  Location: [facility];  Service: Orthopedics;  Laterality: Right;   TUBAL LIGATION     FAMILY HISTORY Family History  Problem Relation Age of Onset   Hypertension Mother    Heart Problems Father    Gout Father    Arthritis Father    SOCIAL HISTORY Social History   Tobacco Use   Smoking status: Former    Packs/day: 1.00    Years: 30.00    Total pack years: 30.00    Types: Cigarettes    Start date: 01/15/1967    Quit date: 06/28/2014    Years since quitting: 7.0   Smokeless tobacco: Never  Vaping Use   Vaping Use: Never used  Substance Use Topics   Alcohol use: No   Drug use: Yes    Types: Hydrocodone       OPHTHALMIC EXAM: Base Eye Exam     Visual Acuity (Snellen - Linear)       Right Left   Dist [place] 20/150-2 20/HM   Dist ph [place] 20/NI          Tonometry (Tonopen, 8:34 AM)       Right Left   Pressure 13 13         Pupils       Dark Light Shape React APD   Right 3 2 Round Sluggish None   Left 3 2 Round Minimal Trace         Visual Fields       Left Right     Full   Restrictions Total superior temporal, inferior temporal, superior nasal, inferior nasal deficiencies          Extraocular Movement       Right Left    Full, Ortho LXT, Ortho    0 0 0  0  0  0 0 0   0 0 0  0  0  0 0 0           Neuro/Psych     Oriented x3: Yes   Mood/Affect: Normal         Dilation     Both eyes: 1.0% Mydriacyl, 2.5% Phenylephrine @ 8:34 AM           Slit Lamp and Fundus Exam     Slit Lamp Exam       Right Left   Lids/Lashes Dermatochalasis - upper lid, mild Meibomian gland dysfunction  Dermatochalasis - upper lid,  mild Meibomian gland dysfunction   Conjunctiva/Sclera Melanosis Melanosis   Cornea Trace, Punctate epithelial erosions, well healed temporal cataract wounds, arcus, trace EBMD Mild arcus, trace PEE   Anterior Chamber Deep and quiet Deep and quiet   Iris Round and poorly dilated to 3.40m Round and moderately dilated to 671m  Lens PC IOL in good position, trace, cortical remnant temporally, PC folds 3+ Nuclear sclerosis with brunescense, 3+ Cortical cataract   Anterior Vitreous Vitreous syneresis, Ozurdex pellet remnants inferiorly, mild blood stained vitreous condensations, IVK settled in IT quad Vitreous syneresis         Fundus Exam       Right Left   Disc 2-3+Pallor, Sharp rim, temporal PPP/PPA, thin superior rim, +cupping, vascular loops, +heme nasally, disc heme at 0700 Mild Pallor, Sharp rim, PPP, severely attenuated vessels   C/D Ratio 0.7 0.3   Macula Blunted foveal reflex, Drusen, Retinal pigment epithelial mottling, temporal cystic changes with exudates and MA-- slightly increased, punctate MA's and exudates greatest temporal macula Flat, Blunted foveal reflex, RPE mottling and clumping, scarring, atrophy, no heme   Vessels attenuated, Tortuous Severe Vascular attenuation, +fibrosis   Periphery Attached, scattered 360 MA Attached, pavingstone and reticular degeneration inferiorly, scattered RPE atrophy, greatest peripapillary             IMAGING AND PROCEDURES  Imaging and Procedures for _0 @  OCT, Retina - OU - Both Eyes       Right Eye Quality was good. Central Foveal Thickness: 192. Progression has worsened. Findings include no SRF, abnormal foveal contour, intraretinal hyper-reflective material, intraretinal fluid, vitreomacular adhesion (Interval increase in cystic changes temporal macula, interval increase in vitreous opacities).   Left Eye Quality was borderline. Central Foveal Thickness: 308. Progression has been stable. Findings  include no SRF, abnormal foveal contour, subretinal hyper-reflective material, epiretinal membrane, intraretinal fluid, pigment epithelial detachment, outer retinal atrophy, preretinal fibrosis (Interval improvement in IRF/cystic changes, persistent central ORA / SRHM).   Notes *Images captured and stored on drive  Diagnosis / Impression:  OD: Interval increase in cystic changes temporal macula, interval increase in vitreous opacities OS: Interval improvement in IRF/cystic changes, persistent central ORA / SRHM  Clinical management:  See below  Abbreviations: NFP - Normal foveal profile. CME - cystoid macular edema. PED - pigment epithelial detachment. IRF - intraretinal fluid. SRF - subretinal fluid. EZ - ellipsoid zone. ERM - epiretinal membrane. ORA - outer retinal atrophy. ORT - outer retinal tubulation. SRHM - subretinal hyper-reflective material            ASSESSMENT/PLAN:    ICD-10-CM   1. Central retinal vein occlusion with macular edema of both eyes  H34.8130 OCT, Retina - OU - Both Eyes    2. Severe nonproliferative diabetic retinopathy of both eyes with macular edema associated with type 2 diabetes mellitus (HCBritton E1Z30.8657   3. Essential hypertension  I10     4. Hypertensive retinopathy of both eyes  H35.033     5. 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 Pseudophakia  Z96.1     7. Primary open angle glaucoma (POAG) of right eye, mild stage  H40.1111      **pt presents acutely for decreased / "dark" vision OD since last IVK injection on   - BCVA down to 20/150 from 20/70 OD - exam and OCT shows interval increase in blood stained vitreous opacities and mild interval increase in IRF/cystic changes temporal mac -  VH precautions reviewed -- minimize activities, keep head elevated, avoid ASA/NSAIDs/blood thinners as able  - dicussed findings, prognosis - recommend VH precautions for now and will follow closely - can give anti-VEGF if VH  fails to improve - f/u 1 wk - DFE/OCT  1. CRVO w/ CME OU  - pt of Dr. Zigmund Daniel who wished to transfer care  - OS long-standing low vision (CF) with subfoveal scar and central retinal atrophy  - OD with CME actively being managed with Ozurdex OD ~q5 wks by Dr. Zigmund Daniel (last on 8.20.20) s/p multiple IVTA and Ozurdex OD  - was due for Ozurdex OD w/ Zigmund Daniel on 9.25.20, but underwent cataract surgery OD w/ Kathlen Mody on 9.21.20  - s/p IVTA OD #4 (10.26.20), #5 (12.10.20), #6 (01.21.21), #7 (03.05.21), #8 (04.16.21), #9 (05.28.21), #10 (07.09.21), #11 (09.14.21), #12 (10.29.21), #13 (12.17.21), #14 (01.27.22), #15 (03.11.2022), #16 (05.04.22), #17 (06.17.22), #18 (07.29.22), #19 (09.16.22), #20 (10.28.22), #21 (12.16.22), #22 (02.03.23) #23 (04.07.23), #24 (06.14.23)  - BCVA decreased to 20/150 from 20/70  - exam shows residual IVTA and new blood stained vit condensations in vitreous cavity  - OCT OD: Interval increase in cystic changes temporal macula, interval increase in vitreous opacities; OS: Interval improvement in IRF/cystic changes, persistent central ORA / SRHM  - IVTA informed consent obtained and re-signed on 06.14.23  - f/u in 1 wk -- DFE/OCT/possible injection  2. Severe Non-proliferative diabetic retinopathy OU.  - OCT shows diabetic macular edema, OD   - s/p IVTA OD 06.14.23, as above  3,4. Hypertensive retinopathy OU  - discussed importance of tight BP control  - monitor    5. Mixed form age related cataract OS  - under the expert management of Dr. Kathlen Mody   6. Pseudophakia OD  - s/p CE/IOL OD (9.21.20) by expert surgeon, Dr. Kathlen Mody  - IOL in excellent position  - had post op IOP spike -- IOP now under better control  - monitor    7. POAG OD  - was under the expert management of Dr. Kathlen Mody  - s/p SLT and goniotomy OD  - had post-cataract IOP spike  - on brimonidine BID OU  - IOP 13 today  - AC taps post IVTA injection   - has been referred to Dr. Ander Slade -- appt scheduled  for 10.30.23  Ophthalmic Meds Ordered this visit:  No orders of the defined types were placed in this encounter.    Return in about 1 week (around 07/30/2021) for f/u CRVO OU, DFE, OCT.  There are no Patient Instructions on file for this visit.   This document serves as a record of services personally performed by Gardiner Sleeper, MD, PhD. It was created on their behalf by Orvan Falconer, an ophthalmic technician. The creation of this record is the provider's dictation and/or activities during the visit.    Electronically signed by: Orvan Falconer, OA, 07/23/21  9:14 AM  This document serves as a record of services personally performed by Gardiner Sleeper, MD, PhD. It was created on their behalf by San Jetty. Owens Shark, OA an ophthalmic technician. The creation of this record is the provider's dictation and/or activities during the visit.    Electronically signed by: San Jetty. Owens Shark, [place] 07.10.2023 9:14 AM  Gardiner Sleeper, M.D., Ph.D. Diseases & Surgery of the Retina and Vitreous [facility]  I have reviewed the above documentation for accuracy and completeness, and I agree with the above. Gardiner Sleeper, M.D., Ph.D. 07/23/21 9:20  AM  Abbreviations: M myopia (nearsighted); A astigmatism; H hyperopia (farsighted); P presbyopia; Mrx spectacle prescription;  CTL contact lenses; OD right eye; OS left eye; OU both eyes  XT exotropia; ET esotropia; PEK punctate epithelial keratitis; PEE punctate epithelial erosions; DES dry eye syndrome; MGD meibomian gland dysfunction; ATs artificial tears; PFAT's preservative free artificial tears; [place] nuclear sclerotic cataract; PSC posterior subcapsular cataract; ERM epi-retinal membrane; PVD posterior vitreous detachment; RD retinal detachment; DM diabetes mellitus; DR diabetic retinopathy; NPDR non-proliferative diabetic retinopathy; PDR proliferative diabetic retinopathy; CSME clinically significant macular edema; DME diabetic macular  edema; dbh dot blot hemorrhages; CWS cotton wool spot; POAG primary open angle glaucoma; C/D cup-to-disc ratio; HVF humphrey visual field; GVF goldmann visual field; OCT optical coherence tomography; IOP intraocular pressure; BRVO Branch retinal vein occlusion; CRVO central retinal vein occlusion; CRAO central retinal artery occlusion; BRAO branch retinal artery occlusion; RT retinal tear; SB scleral buckle; PPV pars plana vitrectomy; VH Vitreous hemorrhage; PRP panretinal laser photocoagulation; IVK intravitreal kenalog; VMT vitreomacular traction; MH Macular hole;  NVD neovascularization of the disc; NVE neovascularization elsewhere; AREDS age related eye disease study; ARMD age related macular degeneration; POAG primary open angle glaucoma; EBMD epithelial/anterior basement membrane dystrophy; ACIOL anterior chamber intraocular lens; IOL intraocular lens; PCIOL posterior chamber intraocular lens; Phaco/IOL phacoemulsification with intraocular lens placement; [place] photorefractive keratectomy; LASIK laser assisted in situ keratomileusis; HTN hypertension; DM diabetes mellitus; COPD chronic obstructive pulmonary disease

## 2021-07-25 NOTE — Progress Notes (Signed)
[facility] Note  07/30/2021     CHIEF COMPLAINT Patient presents for Retina Follow Up    HISTORY OF PRESENT ILLNESS: Mary [name] is a 76 y.o. female who presents to the clinic today for:   HPI     Retina Follow Up   Patient presents with  CRVO/BRVO.  In both eyes.  This started 1 week ago.  I, the attending physician,  performed the HPI with the patient and updated documentation appropriately.        Comments   Patient here for 1 weeks retina follow up for CRVO OU. Patient states vision about the same. No eye pain.       Last edited by Bernarda Caffey, MD on 07/30/2021  8:55 AM.    Pt presents   Referring physician: No referring provider defined for this encounter.  HISTORICAL INFORMATION:   Selected notes from the MEDICAL RECORD NUMBER Transferred care from Dr. Zigmund Daniel   CURRENT MEDICATIONS: Current Outpatient Medications (Ophthalmic Drugs)  Medication Sig   brimonidine (ALPHAGAN) 0.2 % ophthalmic solution INSTILL 1 DROP INTO BOTH EYES TWICE A DAY   timolol (BETIMOL) 0.25 % ophthalmic solution Place 1 drop into the right eye 2 (two) times daily.   No current facility-administered medications for this visit. (Ophthalmic Drugs)   Current Outpatient Medications (Other)  Medication Sig   aspirin 81 MG chewable tablet 1 tablet   Assure Comfort Lancets 28G MISC    Blood Glucose Monitoring Suppl (ONETOUCH VERIO FLEX SYSTEM) w/Device KIT Use as directed to check blood sugars 2 times per day dx: e11.65   cetirizine (ZYRTEC) 10 MG tablet Take 1 tablet (10 mg total) by mouth daily.   fluticasone (FLONASE) 50 MCG/ACT nasal spray SPRAY 1 SPRAY INTO EACH NOSTRIL EVERY DAY   furosemide (LASIX) 40 MG tablet TAKE 1 TABLET BY MOUTH  DAILY   glucose blood (ONETOUCH VERIO) test strip Use as directed to check blood sugars 2 times per day dx: e11.65   HYDROcodone-acetaminophen (NORCO) 10-325 MG tablet Take 1 tablet by mouth 5 (five) times daily as  needed.   levothyroxine (SYNTHROID) 125 MCG tablet Take 125 mcg by mouth daily before breakfast.   metoprolol succinate (TOPROL-XL) 50 MG 24 hr tablet TAKE 1 TABLET BY MOUTH  DAILY   omeprazole (PRILOSEC) 20 MG capsule TAKE 1 CAPSULE BY MOUTH  DAILY   OneTouch Delica Lancets 22W MISC Use as directed to check blood sugars 2 times per day dx: e11.65   pravastatin (PRAVACHOL) 40 MG tablet Take 1 tablet (40 mg total) by mouth daily.   telmisartan (MICARDIS) 80 MG tablet TAKE 1 TABLET BY MOUTH  DAILY   triamcinolone (KENALOG) 0.1 % APPLY TO AFFECTED AREA TWICE A DAY   No current facility-administered medications for this visit. (Other)   REVIEW OF SYSTEMS: ROS   Positive for: Genitourinary, Musculoskeletal, Endocrine, Eyes Negative for: Constitutional, Gastrointestinal, Neurological, Skin, HENT, Cardiovascular, Respiratory, Psychiatric, Allergic/Imm, Heme/Lymph Last edited by Theodore Demark, COA on 07/30/2021  8:41 AM.     ALLERGIES No Known Allergies  PAST MEDICAL HISTORY Past Medical History:  Diagnosis Date   Arthritis    osteoarthritis   Cataract    OS   Diabetes mellitus    Insulin dependent   Diabetic retinopathy ([place])    NPDR OU   Glaucoma    POAG OD   H/O cardiovascular stress test    pt. reports 10/13- was normal per pt.    Hyperlipemia  Hypertension    Hypertensive retinopathy    OU   Past Surgical History:  Procedure Laterality Date   CARPAL TUNNEL RELEASE Bilateral    CATARACT EXTRACTION Right 10/05/2018   Dr. Kathlen Mody   EYE SURGERY Right    Cat Sx   KNEE ARTHROSCOPY     right   QUADRICEPS TENDON REPAIR  02/17/2012   Procedure: REPAIR QUADRICEP TENDON;  Surgeon: Vickey Huger, MD;  Location: [place];  Service: Orthopedics;  Laterality: Right;  right quadriceps repair with latera release   QUADRICEPS TENDON REPAIR Right 05/15/2012   Procedure: REPAIR QUADRICEP TENDON;  Surgeon: Vickey Huger, MD;  Location: [place];  Service: Orthopedics;  Laterality: Right;    TOTAL KNEE ARTHROPLASTY  10/21/2011   Procedure: TOTAL KNEE ARTHROPLASTY;  Surgeon: Rudean Haskell, MD;  Location: [place];  Service: Orthopedics;  Laterality: Right;   TUBAL LIGATION     FAMILY HISTORY Family History  Problem Relation Age of Onset   Hypertension Mother    Heart Problems Father    Gout Father    Arthritis Father    SOCIAL HISTORY Social History   Tobacco Use   Smoking status: Former    Packs/day: 1.00    Years: 30.00    Total pack years: 30.00    Types: Cigarettes    Start date: 01/15/1967    Quit date: 06/28/2014    Years since quitting: 7.0   Smokeless tobacco: Never  Vaping Use   Vaping Use: Never used  Substance Use Topics   Alcohol use: No   Drug use: Yes    Types: Hydrocodone       OPHTHALMIC EXAM: Base Eye Exam     Visual Acuity (Snellen - Linear)       Right Left   Dist [place] 20/150 HM   Dist ph  NI NI         Tonometry (Tonopen, 8:39 AM)       Right Left   Pressure 11 13         Pupils       Dark Light Shape React APD   Right 3 2 Round Sluggish None   Left 3 2 Round Minimal None         Visual Fields (Counting fingers)       Left Right     Full   Restrictions Total superior temporal, inferior temporal, superior nasal, inferior nasal deficiencies          Extraocular Movement       Right Left    Full, Ortho Full, Ortho         Neuro/Psych     Oriented x3: Yes   Mood/Affect: Normal         Dilation     Both eyes: 1.0% Mydriacyl, 2.5% Phenylephrine @ 8:39 AM           Slit Lamp and Fundus Exam     Slit Lamp Exam       Right Left   Lids/Lashes Dermatochalasis - upper lid, mild Meibomian gland dysfunction Dermatochalasis - upper lid, mild Meibomian gland dysfunction   Conjunctiva/Sclera Melanosis Melanosis   Cornea Trace, Punctate epithelial erosions, well healed temporal cataract wounds, arcus, trace EBMD Mild arcus, trace PEE   Anterior Chamber Deep and quiet Deep and quiet   Iris Round and  poorly dilated to 3.26mm Round and moderately dilated to 60mm   Lens PC IOL in good position, trace, cortical remnant temporally, PC folds 3+ Nuclear sclerosis  with brunescense, 3+ Cortical cataract   Anterior Vitreous Vitreous syneresis, Ozurdex pellet remnants inferiorly, mild blood stained vitreous condensations -- improved, IVK settled in IT quad Vitreous syneresis         Fundus Exam       Right Left   Disc 2-3+Pallor, Sharp rim, temporal PPP/PPA, thin superior rim, +cupping, vascular loops, +heme nasally, disc heme at 0700 Mild Pallor, Sharp rim, PPP, severely attenuated vessels   C/D Ratio 0.7 0.3   Macula Blunted foveal reflex, [place], Retinal pigment epithelial mottling, temporal cystic changes with exudates and MA -- slightly increased, punctate MA's and exudates greatest temporal macula Flat, Blunted foveal reflex, RPE mottling and clumping, scarring, atrophy, no heme   Vessels attenuated, Tortuous Severe Vascular attenuation, +fibrosis   Periphery Attached, scattered 360 MA and DBH Attached, pavingstone and reticular degeneration inferiorly, scattered RPE atrophy, greatest peripapillary             IMAGING AND PROCEDURES  Imaging and Procedures for _0 @  OCT, Retina - OU - Both Eyes       Right Eye Quality was good. Central Foveal Thickness: 197. Findings include no SRF, abnormal foveal contour, intraretinal hyper-reflective material, intraretinal fluid, vitreomacular adhesion (Interval increase in cystic changes temporal macula, mild interval improvement in vitreous opacities).   Left Eye Quality was borderline. Central Foveal Thickness: 253. Progression has been stable. Findings include no SRF, abnormal foveal contour, subretinal hyper-reflective material, epiretinal membrane, intraretinal fluid, pigment epithelial detachment, outer retinal atrophy, preretinal fibrosis (Scattered IRF/cystic changes, persistent central ORA / SRHM).   Notes *Images captured and stored  on drive  Diagnosis / Impression:  OD: Interval increase in cystic changes temporal macula, mild interval improvement in vitreous opacities OS: Scattered IRF/cystic changes, persistent central ORA / SRHM  Clinical management:  See below  Abbreviations: NFP - Normal foveal profile. CME - cystoid macular edema. PED - pigment epithelial detachment. IRF - intraretinal fluid. SRF - subretinal fluid. EZ - ellipsoid zone. ERM - epiretinal membrane. ORA - outer retinal atrophy. ORT - outer retinal tubulation. SRHM - subretinal hyper-reflective material      Intravitreal Injection, Pharmacologic Agent - OD - Right Eye       Time Out 07/30/2021. 9:06 AM. Confirmed correct patient, procedure, site, and patient consented.   Anesthesia Topical anesthesia was used. Anesthetic medications included Lidocaine 2%, Proparacaine 0.5%.   Procedure Preparation included 5% betadine to ocular surface, eyelid speculum. A (32g) needle was used.   Injection: 1.25 mg Bevacizumab 1.86m/0.05ml   Route: Intravitreal, Site: Right Eye   NDC: [id number] Lot:: [id number] Expiration date: 10/02/2021   Post-op Post injection exam found visual acuity of at least counting fingers, no retinal detachment, perfused optic nerve. The patient tolerated the procedure well. There were no complications. The patient received written and verbal post procedure care education.            ASSESSMENT/PLAN:    ICD-10-CM   1. Vitreous hemorrhage of right eye (HCC)  H43.11 OCT, Retina - OU - Both Eyes    Intravitreal Injection, Pharmacologic Agent - OD - Right Eye    Bevacizumab (AVASTIN) SOLN 1.25 mg    2. Central retinal vein occlusion with macular edema of both eyes  H34.8130 OCT, Retina - OU - Both Eyes    Intravitreal Injection, Pharmacologic Agent - OD - Right Eye    Bevacizumab (AVASTIN) SOLN 1.25 mg    3. 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 Essential hypertension  I10     5. Hypertensive retinopathy of both eyes  H35.033     6. Combined forms of age-related cataract of left eye  H25.812     7. Pseudophakia  Z96.1     8. Primary open angle glaucoma (POAG) of right eye, mild stage  H40.1111      1. CRVO w/ CME OU  - pt of Dr. Zigmund Daniel who wished to transfer care  - OS long-standing low vision (CF) with subfoveal scar and central retinal atrophy  - OD with CME actively being managed with Ozurdex OD ~q5 wks by Dr. Zigmund Daniel (last on 8.20.20) s/p multiple IVTA and Ozurdex OD  - was due for Ozurdex OD w/ Zigmund Daniel on 9.25.20, but underwent cataract surgery OD w/ Kathlen Mody on 9.21.20  - s/p IVTA OD #4 (10.26.20), #5 (12.10.20), #6 (01.21.21), #7 (03.05.21), #8 (04.16.21), #9 (05.28.21), #10 (07.09.21), #11 (09.14.21), #12 (10.29.21), #13 (12.17.21), #14 (01.27.22), #15 (03.11.2022), #16 (05.04.22), #17 (06.17.22), #18 (07.29.22), #19 (09.16.22), #20 (10.28.22), #21 (12.16.22), #22 (02.03.23) #23 (04.07.23), #24 (06.14.23)  - BCVA stable at 20/150  - exam shows residual IVTA and new blood stained vit condensations in vitreous cavity slightly improved  - OCT OD: Interval increase in cystic changes temporal macula, interval improvement in vitreous opacities; OS: Interval improvement in IRF/cystic changes, persistent central ORA / SRHM  - recommend IVA OD #1 today, 07.17.23 due to persistent VH and increased edema  - RBA of procedure discussed, questions answered - IVA informed consent obtained and signed, 07.17.23 (OD) - see procedure note  - IVTA informed consent obtained and re-signed on 06.14.23  - f/u August 2 -- DFE/OCT/possible injection  2. Severe Non-proliferative diabetic retinopathy OU.  - OCT shows diabetic macular edema, OD -- slightly increased  - s/p IVTA OD on  06.14.23 as above  - recommend IVA OD #1 as above for DME as well as CRVO  3,4. Hypertensive retinopathy OU  - discussed importance of tight BP control  - monitor  5. Mixed form age related cataract OS  - under the expert management of [facility]  6. Pseudophakia OD  - s/p CE/IOL OD (9.21.20) by expert surgeon, Dr. Kathlen Mody  - IOL in excellent position  - had post op IOP spike -- IOP now under better control  - monitor  7. POAG OD  - was under the expert management of Dr. Kathlen Mody  - s/p SLT and goniotomy OD  - had post-cataract IOP spike  - on brimonidine BID OU  - IOP 11 today  - AC taps post IVTA injection   - has been referred to Dr. Fara Chute scheduled for 10.30.23  Ophthalmic Meds Ordered this visit:  Meds ordered this encounter  Medications   Bevacizumab (AVASTIN) SOLN 1.25 mg     Return for f/u August 2 or later, CRVO OU, DFE, OCT.  There are no Patient Instructions on file for this visit.   This document serves as a record of services personally performed by Gardiner Sleeper, MD, PhD. It was created on their behalf by Roselee Nova, COMT. The creation of this record is the provider's dictation and/or activities during the visit.  Electronically signed by: Roselee Nova, COMT 07/30/21 11:33 AM  This document serves as a record of  services personally performed by Gardiner Sleeper, MD, PhD. It was created on their behalf by San Jetty. Owens Shark, OA an ophthalmic technician. The creation of this record is the provider's dictation and/or activities during the visit.    Electronically signed by: San Jetty. Marguerita Merles 07.17.2023 11:33 AM   Gardiner Sleeper, M.D., Ph.D. Diseases & Surgery of the Retina and Vitreous [facility]  I have reviewed the above documentation for accuracy and completeness, and I agree with the above. Gardiner Sleeper, M.D., Ph.D. 07/30/21 11:36 AM   Abbreviations: M myopia (nearsighted); A astigmatism; H hyperopia  (farsighted); P presbyopia; Mrx spectacle prescription;  CTL contact lenses; OD right eye; OS left eye; OU both eyes  XT exotropia; ET esotropia; PEK punctate epithelial keratitis; PEE punctate epithelial erosions; DES dry eye syndrome; MGD meibomian gland dysfunction; ATs artificial tears; PFAT's preservative free artificial tears; [place] nuclear sclerotic cataract; PSC posterior subcapsular cataract; ERM epi-retinal membrane; PVD posterior vitreous detachment; RD retinal detachment; DM diabetes mellitus; DR diabetic retinopathy; NPDR non-proliferative diabetic retinopathy; PDR proliferative diabetic retinopathy; CSME clinically significant macular edema; DME diabetic macular edema; dbh dot blot hemorrhages; CWS cotton wool spot; POAG primary open angle glaucoma; C/D cup-to-disc ratio; HVF humphrey visual field; GVF goldmann visual field; OCT optical coherence tomography; IOP intraocular pressure; BRVO Branch retinal vein occlusion; CRVO central retinal vein occlusion; CRAO central retinal artery occlusion; BRAO branch retinal artery occlusion; RT retinal tear; SB scleral buckle; PPV pars plana vitrectomy; VH Vitreous hemorrhage; PRP panretinal laser photocoagulation; IVK intravitreal kenalog; VMT vitreomacular traction; MH Macular hole;  NVD neovascularization of the disc; NVE neovascularization elsewhere; AREDS age related eye disease study; ARMD age related macular degeneration; POAG primary open angle glaucoma; EBMD epithelial/anterior basement membrane dystrophy; ACIOL anterior chamber intraocular lens; IOL intraocular lens; PCIOL posterior chamber intraocular lens; Phaco/IOL phacoemulsification with intraocular lens placement; [place] photorefractive keratectomy; LASIK laser assisted in situ keratomileusis; HTN hypertension; DM diabetes mellitus; COPD chronic obstructive pulmonary disease

## 2021-07-27 ENCOUNTER — Encounter (INDEPENDENT_AMBULATORY_CARE_PROVIDER_SITE_OTHER): Payer: Medicare (Managed Care) | Admitting: Ophthalmology

## 2021-07-27 DIAGNOSIS — H35033 Hypertensive retinopathy, bilateral: Secondary | ICD-10-CM

## 2021-07-27 DIAGNOSIS — I10 Essential (primary) hypertension: Secondary | ICD-10-CM

## 2021-07-27 DIAGNOSIS — H401111 Primary open-angle glaucoma, right eye, mild stage: Secondary | ICD-10-CM

## 2021-07-27 DIAGNOSIS — E113413 Type 2 diabetes mellitus with severe nonproliferative diabetic retinopathy with macular edema, bilateral: Secondary | ICD-10-CM

## 2021-07-27 DIAGNOSIS — H34813 Central retinal vein occlusion, bilateral, with macular edema: Secondary | ICD-10-CM

## 2021-07-27 DIAGNOSIS — Z961 Presence of intraocular lens: Secondary | ICD-10-CM

## 2021-07-27 DIAGNOSIS — H25812 Combined forms of age-related cataract, left eye: Secondary | ICD-10-CM

## 2021-07-30 ENCOUNTER — Ambulatory Visit (INDEPENDENT_AMBULATORY_CARE_PROVIDER_SITE_OTHER): Payer: Medicare (Managed Care) | Admitting: Ophthalmology

## 2021-07-30 ENCOUNTER — Encounter (INDEPENDENT_AMBULATORY_CARE_PROVIDER_SITE_OTHER): Payer: Self-pay | Admitting: Ophthalmology

## 2021-07-30 DIAGNOSIS — I10 Essential (primary) hypertension: Secondary | ICD-10-CM | POA: Diagnosis not present

## 2021-07-30 DIAGNOSIS — H4311 Vitreous hemorrhage, right eye: Secondary | ICD-10-CM | POA: Diagnosis not present

## 2021-07-30 DIAGNOSIS — H401111 Primary open-angle glaucoma, right eye, mild stage: Secondary | ICD-10-CM

## 2021-07-30 DIAGNOSIS — E113413 Type 2 diabetes mellitus with severe nonproliferative diabetic retinopathy with macular edema, bilateral: Secondary | ICD-10-CM | POA: Diagnosis not present

## 2021-07-30 DIAGNOSIS — H34813 Central retinal vein occlusion, bilateral, with macular edema: Secondary | ICD-10-CM | POA: Diagnosis not present

## 2021-07-30 DIAGNOSIS — Z961 Presence of intraocular lens: Secondary | ICD-10-CM

## 2021-07-30 DIAGNOSIS — H35033 Hypertensive retinopathy, bilateral: Secondary | ICD-10-CM

## 2021-07-30 DIAGNOSIS — H25812 Combined forms of age-related cataract, left eye: Secondary | ICD-10-CM

## 2021-07-30 MED ORDER — BEVACIZUMAB CHEMO INJECTION 1.25MG/0.05ML SYRINGE FOR KALEIDOSCOPE
1.2500 mg | INTRAVITREAL | Status: AC | PRN
  Administered 2021-07-30: 1.25 mg via INTRAVITREAL

## 2021-08-03 ENCOUNTER — Other Ambulatory Visit: Payer: Self-pay | Admitting: *Deleted

## 2021-08-03 DIAGNOSIS — L819 Disorder of pigmentation, unspecified: Secondary | ICD-10-CM

## 2021-08-08 NOTE — Progress Notes (Signed)
[facility] Note  08/15/2021     CHIEF COMPLAINT Patient presents for Retina Follow Up    HISTORY OF PRESENT ILLNESS: Mary [name] is a 76 y.o. female who presents to the clinic today for:   HPI     Retina Follow Up   Patient presents with  CRVO/BRVO.  In both eyes.  This started 2 weeks ago.  I, the attending physician,  performed the HPI with the patient and updated documentation appropriately.        Comments   Patient here for 2 weeks retina follow up for CRVO OU. Patient states vision doing terrible. About as worse as it was. No eye pain.       Last edited by Bernarda Caffey, MD on 08/15/2021  9:01 AM.    Pt feels like vision is getting worse, she states she saw Dr. Ander Slade and he told her that she did not have glaucoma   Referring physician: No referring provider defined for this encounter.  HISTORICAL INFORMATION:   Selected notes from the MEDICAL RECORD NUMBER Transferred care from Dr. Zigmund Daniel   CURRENT MEDICATIONS: Current Outpatient Medications (Ophthalmic Drugs)  Medication Sig   brimonidine (ALPHAGAN) 0.2 % ophthalmic solution INSTILL 1 DROP INTO BOTH EYES TWICE A DAY   timolol (BETIMOL) 0.25 % ophthalmic solution Place 1 drop into the right eye 2 (two) times daily.   No current facility-administered medications for this visit. (Ophthalmic Drugs)   Current Outpatient Medications (Other)  Medication Sig   aspirin 81 MG chewable tablet 1 tablet   Assure Comfort Lancets 28G MISC    Blood Glucose Monitoring Suppl (ONETOUCH VERIO FLEX SYSTEM) w/Device KIT Use as directed to check blood sugars 2 times per day dx: e11.65   cetirizine (ZYRTEC) 10 MG tablet Take 1 tablet (10 mg total) by mouth daily.   fluticasone (FLONASE) 50 MCG/ACT nasal spray SPRAY 1 SPRAY INTO EACH NOSTRIL EVERY DAY   furosemide (LASIX) 40 MG tablet TAKE 1 TABLET BY MOUTH  DAILY   glucose blood (ONETOUCH VERIO) test strip Use as directed to check blood sugars 2  times per day dx: e11.65   HYDROcodone-acetaminophen (NORCO) 10-325 MG tablet Take 1 tablet by mouth 5 (five) times daily as needed.   levothyroxine (SYNTHROID) 125 MCG tablet Take 125 mcg by mouth daily before breakfast.   metoprolol succinate (TOPROL-XL) 50 MG 24 hr tablet TAKE 1 TABLET BY MOUTH  DAILY   omeprazole (PRILOSEC) 20 MG capsule TAKE 1 CAPSULE BY MOUTH  DAILY   OneTouch Delica Lancets 56E MISC Use as directed to check blood sugars 2 times per day dx: e11.65   pravastatin (PRAVACHOL) 40 MG tablet TAKE 1 TABLET BY MOUTH EVERY DAY   telmisartan (MICARDIS) 80 MG tablet TAKE 1 TABLET BY MOUTH  DAILY   triamcinolone (KENALOG) 0.1 % APPLY TO AFFECTED AREA TWICE A DAY   No current facility-administered medications for this visit. (Other)   REVIEW OF SYSTEMS: ROS   Positive for: Genitourinary, Musculoskeletal, Endocrine, Eyes Negative for: Constitutional, Gastrointestinal, Neurological, Skin, HENT, Cardiovascular, Respiratory, Psychiatric, Allergic/Imm, Heme/Lymph Last edited by Theodore Demark, COA on 08/15/2021  8:15 AM.     ALLERGIES No Known Allergies  PAST MEDICAL HISTORY Past Medical History:  Diagnosis Date   Arthritis    osteoarthritis   Cataract    OS   Diabetes mellitus    Insulin dependent   Diabetic retinopathy (HCC)    NPDR OU   Glaucoma  POAG OD   H/O cardiovascular stress test    pt. reports 10/13- was normal per pt.    Hyperlipemia    Hypertension    Hypertensive retinopathy    OU   Past Surgical History:  Procedure Laterality Date   CARPAL TUNNEL RELEASE Bilateral    CATARACT EXTRACTION Right 10/05/2018   Dr. Kathlen Mody   EYE SURGERY Right    Cat Sx   KNEE ARTHROSCOPY     right   QUADRICEPS TENDON REPAIR  02/17/2012   Procedure: REPAIR QUADRICEP TENDON;  Surgeon: Vickey Huger, MD;  Location: [facility];  Service: Orthopedics;  Laterality: Right;  right quadriceps repair with latera release   QUADRICEPS TENDON REPAIR Right 05/15/2012   Procedure:  REPAIR QUADRICEP TENDON;  Surgeon: Vickey Huger, MD;  Location: [facility];  Service: Orthopedics;  Laterality: Right;   TOTAL KNEE ARTHROPLASTY  10/21/2011   Procedure: TOTAL KNEE ARTHROPLASTY;  Surgeon: Rudean Haskell, MD;  Location: [facility];  Service: Orthopedics;  Laterality: Right;   TUBAL LIGATION     FAMILY HISTORY Family History  Problem Relation Age of Onset   Hypertension Mother    Heart Problems Father    Gout Father    Arthritis Father    SOCIAL HISTORY Social History   Tobacco Use   Smoking status: Former    Packs/day: 1.00    Years: 30.00    Total pack years: 30.00    Types: Cigarettes    Start date: 01/15/1967    Quit date: 06/28/2014    Years since quitting: 7.1   Smokeless tobacco: Never  Vaping Use   Vaping Use: Never used  Substance Use Topics   Alcohol use: No   Drug use: Yes    Types: Hydrocodone       OPHTHALMIC EXAM: Base Eye Exam     Visual Acuity (Snellen - Linear)       Right Left   Dist [place] 20/150 HM   Dist ph [place] NI          Tonometry (Tonopen, 8:12 AM)       Right Left   Pressure 10 12         Pupils       Dark Light Shape React APD   Right 3 2 Round Sluggish None   Left 3 2 Round Minimal None         Visual Fields (Counting fingers)       Left Right     Full   Restrictions Total superior temporal, inferior temporal, superior nasal, inferior nasal deficiencies          Extraocular Movement       Right Left    Full, Ortho Full, Ortho         Neuro/Psych     Oriented x3: Yes   Mood/Affect: Normal         Dilation     Both eyes: 1.0% Mydriacyl, 2.5% Phenylephrine @ 8:12 AM           Slit Lamp and Fundus Exam     Slit Lamp Exam       Right Left   Lids/Lashes Dermatochalasis - upper lid, mild Meibomian gland dysfunction Dermatochalasis - upper lid, mild Meibomian gland dysfunction   Conjunctiva/Sclera Melanosis Melanosis   Cornea Trace, Punctate epithelial erosions, well healed temporal cataract  wounds, arcus, trace EBMD Mild arcus, trace PEE   Anterior Chamber Deep and quiet Deep and quiet   Iris Round and poorly dilated  to 3.93m Round and moderately dilated to 656m  Lens PC IOL in good position, trace, cortical remnant temporally, PC folds 3+ Nuclear sclerosis with brunescense, 3+ Cortical cataract   Anterior Vitreous Vitreous syneresis, Ozurdex pellet remnants inferiorly, mild blood stained vitreous condensations -- improved, residual IVK settled in IT quad Vitreous syneresis         Fundus Exam       Right Left   Disc 2-3+Pallor, Sharp rim, temporal PPP/PPA, thin superior rim, +cupping, vascular loops, +heme nasally - improved, disc heme at 0700 - improved Mild Pallor, Sharp rim, PPP, severely attenuated vessels   C/D Ratio 0.7 0.3   Macula Blunted foveal reflex, Drusen, Retinal pigment epithelial mottling, temporal cystic changes with exudates and MA -- improved, punctate exudates - improved, persistent IRH greatest temporal macula Flat, Blunted foveal reflex, RPE mottling and clumping, scarring, atrophy, no heme   Vessels attenuated, Tortuous Severe Vascular attenuation, +fibrosis   Periphery Attached, scattered 360 MA and DBH -- slightly improved Attached, pavingstone and reticular degeneration inferiorly, scattered RPE atrophy, greatest peripapillary             IMAGING AND PROCEDURES  Imaging and Procedures for _0 @  OCT, Retina - OU - Both Eyes       Right Eye Quality was good. Central Foveal Thickness: 187. Progression has improved. Findings include no IRF, no SRF, abnormal foveal contour, intraretinal hyper-reflective material, vitreomacular adhesion (Interval improvement in cystic changes temporal macula, persistent vitreous opacities -- slightly improved).   Left Eye Quality was borderline. Central Foveal Thickness: 343. Progression has been stable. Findings include no SRF, abnormal foveal contour, subretinal hyper-reflective material, epiretinal membrane,  intraretinal fluid, pigment epithelial detachment, outer retinal atrophy, preretinal fibrosis (Scattered IRF/cystic changes, persistent central ORA / SRHM).   Notes *Images captured and stored on drive  Diagnosis / Impression:  OD: Interval improvement in cystic changes temporal macula, persistent vitreous opacities -- slightly improved OS: Scattered IRF/cystic changes, persistent central ORA / SRHM  Clinical management:  See below  Abbreviations: NFP - Normal foveal profile. CME - cystoid macular edema. PED - pigment epithelial detachment. IRF - intraretinal fluid. SRF - subretinal fluid. EZ - ellipsoid zone. ERM - epiretinal membrane. ORA - outer retinal atrophy. ORT - outer retinal tubulation. SRHM - subretinal hyper-reflective material             ASSESSMENT/PLAN:    ICD-10-CM   1. Vitreous hemorrhage of right eye (HCC)  H43.11 OCT, Retina - OU - Both Eyes    2. Central retinal vein occlusion with macular edema of both eyes  H34.8130 OCT, Retina - OU - Both Eyes    3. Severe nonproliferative diabetic retinopathy of both eyes with macular edema associated with type 2 diabetes mellitus (HCSt. Johns [id number]   4. Essential hypertension  I10     5. Hypertensive retinopathy of both eyes  H35.033     6. Combined forms of age-related cataract of left eye  H25.812     7. Pseudophakia  Z96.1     8. Primary open angle glaucoma (POAG) of right eye, mild stage  H40.1111      1. CRVO w/ CME OU  - pt of Dr. MaZigmund Danielho wished to transfer care  - OS long-standing low vision (CF) with subfoveal scar and central retinal atrophy  - OD with CME actively being managed with Ozurdex OD ~q5 wks by Dr. MaZigmund Daniellast on 8.20.20) s/p multiple IVTA and Ozurdex OD  - was  due for Ozurdex OD w/ Zigmund Daniel on 9.25.20, but underwent cataract surgery OD w/ Kathlen Mody on 9.21.20  - s/p IVTA OD #4 (10.26.20), #5 (12.10.20), #6 (01.21.21), #7 (03.05.21), #8 (04.16.21), #9 (05.28.21), #10 (07.09.21), #11  (09.14.21), #12 (10.29.21), #13 (12.17.21), #14 (01.27.22), #15 (03.11.2022), #16 (05.04.22), #17 (06.17.22), #18 (07.29.22), #19 (09.16.22), #20 (10.28.22), #21 (12.16.22), #22 (02.03.23) #23 (04.07.23), #24 (06.14.23)  - s/p IVA OD #1 (07.17.23)  - BCVA stable at 20/150  - exam shows mild improvement in blood stained vit condensations; +residual IVTA in vit  - OCT OD: Interval improvement in cystic changes temporal macula, persistent vitreous opacities -- slightly improved; OS: persistent central ORA / SRHM  - recommend holding off on treatment today -- no edema OD and too soon for repeat IVA and residual IVTA still present in vitreous. - IVA informed consent obtained and signed, 07.17.23 (OD)  - IVTA informed consent obtained and re-signed on 06.14.23  - f/u August 14 -- DFE/OCT/possible injection  2. Severe Non-proliferative diabetic retinopathy OU.  - OCT shows diabetic macular edema, OD -- slightly increased  - s/p IVTA OD on 06.14.23 as above  - s/p IVA OD #1 on 07.17.23 as above for DME as well as CRVO  3,4. Hypertensive retinopathy OU  - discussed importance of tight BP control  - monitor  5. Mixed form age related cataract OS  - under the expert management of [facility]  6. Pseudophakia OD  - s/p CE/IOL OD (9.21.20) by expert surgeon, Dr. Kathlen Mody  - IOL in excellent position  - had post op IOP spike -- IOP now under better control  - monitor  7. POAG OD  - now under the expert management of Dr. Ander Slade  - s/p SLT and goniotomy OD w/ Dr. Kathlen Mody  - had post-cataract IOP spike  - on brimonidine TID OU and timolol BID OU  - IOP 10 today  - AC taps post IVTA injection    Ophthalmic Meds Ordered this visit:  No orders of the defined types were placed in this encounter.    Return in about 12 days (around 08/27/2021) for f/u CRVO OU, DFE, OCT.  There are no Patient Instructions on file for this visit.   This document serves as a record of services personally performed  by Gardiner Sleeper, MD, PhD. It was created on their behalf by Orvan Falconer, an ophthalmic technician. The creation of this record is the provider's dictation and/or activities during the visit.    Electronically signed by: Orvan Falconer, OA, 08/15/21  9:03 AM  This document serves as a record of services personally performed by Gardiner Sleeper, MD, PhD. It was created on their behalf by San Jetty. Owens Shark, OA an ophthalmic technician. The creation of this record is the provider's dictation and/or activities during the visit.    Electronically signed by: San Jetty. Owens Shark, [place] 08.02.2023 9:03 AM   Gardiner Sleeper, M.D., Ph.D. Diseases & Surgery of the Retina and Vitreous [facility]  I have reviewed the above documentation for accuracy and completeness, and I agree with the above. Gardiner Sleeper, M.D., Ph.D. 08/15/21 9:08 AM   Abbreviations: M myopia (nearsighted); A astigmatism; H hyperopia (farsighted); P presbyopia; Mrx spectacle prescription;  CTL contact lenses; OD right eye; OS left eye; OU both eyes  XT exotropia; ET esotropia; PEK punctate epithelial keratitis; PEE punctate epithelial erosions; DES dry eye syndrome; MGD meibomian gland dysfunction; ATs artificial tears; PFAT's preservative free artificial  tears; [place] nuclear sclerotic cataract; PSC posterior subcapsular cataract; ERM epi-retinal membrane; PVD posterior vitreous detachment; RD retinal detachment; DM diabetes mellitus; DR diabetic retinopathy; NPDR non-proliferative diabetic retinopathy; PDR proliferative diabetic retinopathy; CSME clinically significant macular edema; DME diabetic macular edema; dbh dot blot hemorrhages; CWS cotton wool spot; POAG primary open angle glaucoma; C/D cup-to-disc ratio; HVF humphrey visual field; GVF goldmann visual field; OCT optical coherence tomography; IOP intraocular pressure; BRVO Branch retinal vein occlusion; CRVO central retinal vein occlusion; CRAO central retinal  artery occlusion; BRAO branch retinal artery occlusion; RT retinal tear; SB scleral buckle; PPV pars plana vitrectomy; VH Vitreous hemorrhage; PRP panretinal laser photocoagulation; IVK intravitreal kenalog; VMT vitreomacular traction; MH Macular hole;  NVD neovascularization of the disc; NVE neovascularization elsewhere; AREDS age related eye disease study; ARMD age related macular degeneration; POAG primary open angle glaucoma; EBMD epithelial/anterior basement membrane dystrophy; ACIOL anterior chamber intraocular lens; IOL intraocular lens; PCIOL posterior chamber intraocular lens; Phaco/IOL phacoemulsification with intraocular lens placement; [place] photorefractive keratectomy; LASIK laser assisted in situ keratomileusis; HTN hypertension; DM diabetes mellitus; COPD chronic obstructive pulmonary disease

## 2021-08-09 ENCOUNTER — Ambulatory Visit: Payer: Medicare (Managed Care) | Admitting: Podiatry

## 2021-08-10 ENCOUNTER — Encounter: Payer: Self-pay | Admitting: Physician Assistant

## 2021-08-10 ENCOUNTER — Ambulatory Visit (INDEPENDENT_AMBULATORY_CARE_PROVIDER_SITE_OTHER): Payer: Medicare (Managed Care) | Admitting: Physician Assistant

## 2021-08-10 DIAGNOSIS — I890 Lymphedema, not elsewhere classified: Secondary | ICD-10-CM

## 2021-08-10 NOTE — Progress Notes (Signed)
Office Visit Note   Patient: Mary [name]           Date of Birth: 15-Mar-1945           MRN: [id number] Visit Date: 08/10/2021              Requested by: No referring provider defined for this encounter. PCP: Pcp, No  Chief Complaint  Patient presents with   Right Knee - New Patient (Initial Visit)      HPI: Patient is a pleasant 76 year old who is referred by [facility] with a request to "clear for physical therapy "patient has a history of diabetes and chronic kidney disease.  Also history of morbid obesity.  She is status post right knee replacement in 2012 and in 2014 underwent quadricep tendon repair x2.  She has significant lymphedema in both her lower extremities.  She says she has a lymphedema machine but has trouble using it on her own and only uses it 3 times a week or so.  She thinks the last time she ambulated was 7 months ago using a walker.  She is not really sure why she is here today.  Assessment & Plan: Visit Diagnoses:  1. Lymphedema     Plan: Severe lymphedema but do not see any signs of cellulitis or infection at this time.  Did attempt to reach her case manager today but have been unable to do so.  Based on reviewing notes she is scheduled for ABIs on August 10.  Have discussed her with Dr. Audrie Lia team.  We will go forward with Dynaflex wraps today.  She can follow-up with Dr. Lajoyce Corners in 1 week.  Follow-Up Instructions: 1 week  Ortho Exam  Patient is alert, oriented, no adenopathy, well-dressed, normal affect, normal respiratory effort. Examination patient is sitting comfortably in a wheelchair.  Her legs are warm have no cellulitic changes.  She has significant lymphedema in bilateral lower extremities left greater than right.  She does have a small ulcer which is chronic on her great toe on the left this does not probe to bone.  Her feet are warm she does have some discoloration on the dorsum of her right foot but the foot is warm.  Again no sent ascending  cellulitis.  Imaging: No results found.     Labs: Lab Results  Component Value Date   HGBA1C 5.2 12/12/2020   HGBA1C 5.1 07/27/2020   HGBA1C 5.1 03/27/2020   REPTSTATUS 07/24/2020 FINAL 07/22/2020   CULT  07/22/2020    NO GROWTH Performed at [facility], [address] [place], [place] [address]      Lab Results  Component Value Date   ALBUMIN 4.1 12/12/2020   ALBUMIN 4.0 07/22/2020   ALBUMIN 4.1 03/27/2020    No results found for: "MG" No results found for: "VD25OH"  No results found for: "PREALBUMIN"    Latest Ref Rng & Units 07/22/2020    8:20 PM 08/08/2019    4:08 PM 06/28/2019   10:34 AM  CBC EXTENDED  WBC 4.0 - 10.5 K/uL 3.8  4.8  5.5   RBC 3.87 - 5.11 MIL/uL 3.83  3.39  3.54   Hemoglobin 12.0 - 15.0 g/dL 01.7  51.0  25.8   HCT 36.0 - 46.0 % 35.3  32.0  33.5   Platelets 150 - 400 K/uL 188  260  225   NEUT# 1.7 - 7.7 K/uL 2.2   3.6   Lymph# 0.7 - 4.0 K/uL 1.1  1.3      There is no height or weight on file to calculate BMI.  Orders:  No orders of the defined types were placed in this encounter.  No orders of the defined types were placed in this encounter.    Procedures: No procedures performed  Clinical Data: No additional findings.  ROS:  All other systems negative, except as noted in the HPI. Review of Systems  Objective: Vital Signs: There were no vitals taken for this visit.  Specialty Comments:  No specialty comments available.  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   Primary osteoarthritis of both knees 02/21/2018   Class 3 severe obesity due to excess calories with serious comorbidity and body mass index (BMI) of 45.0 to 49.9 in adult [facility]) 02/21/2018    Skin ulcer of right foot with fat layer exposed (HCC) 02/18/2018   Morbid (severe) obesity due to excess calories (HCC) 11/18/2017   Knee pain 07/16/2015   Lateral dislocation of right patella 05/04/2015   Rupture of right quadriceps tendon 05/24/2014   S/P TKR (total knee replacement) 05/05/2012   Past Medical History:  Diagnosis Date   Arthritis    osteoarthritis   Cataract    OS   Diabetes mellitus    Insulin dependent   Diabetic retinopathy (HCC)    NPDR OU   Glaucoma    POAG OD   H/O cardiovascular stress test    pt. reports 10/13- was normal per pt.    Hyperlipemia    Hypertension    Hypertensive retinopathy    OU    Family History  Problem Relation Age of Onset   Hypertension Mother    Heart Problems Father    Gout Father    Arthritis Father     Past Surgical History:  Procedure Laterality Date   CARPAL TUNNEL RELEASE Bilateral    CATARACT EXTRACTION Right 10/05/2018   Dr. Alben Spittle   EYE SURGERY Right    Cat Sx   KNEE ARTHROSCOPY     right   QUADRICEPS TENDON REPAIR  02/17/2012   Procedure: REPAIR QUADRICEP TENDON;  Surgeon: Dannielle Huh, MD;  Location: [facility];  Service: Orthopedics;  Laterality: Right;  right quadriceps repair with latera release   QUADRICEPS TENDON REPAIR Right 05/15/2012   Procedure: REPAIR QUADRICEP TENDON;  Surgeon: Dannielle Huh, MD;  Location: [facility];  Service: Orthopedics;  Laterality: Right;   TOTAL KNEE ARTHROPLASTY  10/21/2011   Procedure: TOTAL KNEE ARTHROPLASTY;  Surgeon: Raymon Mutton, MD;  Location: [facility];  Service: Orthopedics;  Laterality: Right;   TUBAL LIGATION     Social History   Occupational History   Occupation: retired  Tobacco Use   Smoking status: Former    Packs/day: 1.00    Years: 30.00    Total pack years: 30.00    Types: Cigarettes    Start date: 01/15/1967    Quit date: 06/28/2014    Years since quitting: 7.1   Smokeless tobacco: Never  Vaping Use   Vaping Use: Never used  Substance and Sexual Activity    Alcohol use: No   Drug use: Yes    Types: Hydrocodone   Sexual activity: Not Currently

## 2021-08-11 ENCOUNTER — Other Ambulatory Visit: Payer: Self-pay | Admitting: Internal Medicine

## 2021-08-13 ENCOUNTER — Ambulatory Visit: Payer: Medicare Other

## 2021-08-15 ENCOUNTER — Encounter (INDEPENDENT_AMBULATORY_CARE_PROVIDER_SITE_OTHER): Payer: Self-pay | Admitting: Ophthalmology

## 2021-08-15 ENCOUNTER — Ambulatory Visit (INDEPENDENT_AMBULATORY_CARE_PROVIDER_SITE_OTHER): Payer: Medicare (Managed Care) | Admitting: Ophthalmology

## 2021-08-15 DIAGNOSIS — I10 Essential (primary) hypertension: Secondary | ICD-10-CM

## 2021-08-15 DIAGNOSIS — H35033 Hypertensive retinopathy, bilateral: Secondary | ICD-10-CM

## 2021-08-15 DIAGNOSIS — Z961 Presence of intraocular lens: Secondary | ICD-10-CM

## 2021-08-15 DIAGNOSIS — H34813 Central retinal vein occlusion, bilateral, with macular edema: Secondary | ICD-10-CM

## 2021-08-15 DIAGNOSIS — H25812 Combined forms of age-related cataract, left eye: Secondary | ICD-10-CM

## 2021-08-15 DIAGNOSIS — E113413 Type 2 diabetes mellitus with severe nonproliferative diabetic retinopathy with macular edema, bilateral: Secondary | ICD-10-CM

## 2021-08-15 DIAGNOSIS — H4311 Vitreous hemorrhage, right eye: Secondary | ICD-10-CM

## 2021-08-15 DIAGNOSIS — H401111 Primary open-angle glaucoma, right eye, mild stage: Secondary | ICD-10-CM

## 2021-08-21 ENCOUNTER — Ambulatory Visit: Payer: Medicare (Managed Care) | Admitting: Orthopedic Surgery

## 2021-08-21 NOTE — Progress Notes (Signed)
[facility] Note  08/27/2021     CHIEF COMPLAINT Patient presents for Retina Follow Up    HISTORY OF PRESENT ILLNESS: Mary [name] is a 76 y.o. female who presents to the clinic today for:   HPI     Retina Follow Up   Patient presents with  CRVO/BRVO.  In right eye.  Severity is moderate.  Duration of 12 days.  Since onset it is stable.  I, the attending physician,  performed the HPI with the patient and updated documentation appropriately.        Comments   Pt states vision is not any better, she can barely see the TV now      Last edited by Bernarda Caffey, MD on 08/27/2021  8:29 AM.    Clarnce Flock Dr. Edilia Bo on 08/09/21 for initial POAG consult Timolol 0.5% x2 OD -- concentration increased Brimonidine x3 OU -- TID from BID   Referring physician: No referring provider defined for this encounter.  HISTORICAL INFORMATION:   Selected notes from the MEDICAL RECORD NUMBER Transferred care from Dr. Zigmund Daniel   CURRENT MEDICATIONS: Current Outpatient Medications (Ophthalmic Drugs)  Medication Sig   brimonidine (ALPHAGAN) 0.2 % ophthalmic solution INSTILL 1 DROP INTO BOTH EYES TWICE A DAY   timolol (BETIMOL) 0.25 % ophthalmic solution Place 1 drop into the right eye 2 (two) times daily.   No current facility-administered medications for this visit. (Ophthalmic Drugs)   Current Outpatient Medications (Other)  Medication Sig   aspirin 81 MG chewable tablet 1 tablet   Assure Comfort Lancets 28G MISC    Blood Glucose Monitoring Suppl (ONETOUCH VERIO FLEX SYSTEM) w/Device KIT Use as directed to check blood sugars 2 times per day dx: e11.65   cephALEXin (KEFLEX) 500 MG capsule Take 1 capsule (500 mg total) by mouth 3 (three) times daily.   cetirizine (ZYRTEC) 10 MG tablet Take 1 tablet (10 mg total) by mouth daily.   fluticasone (FLONASE) 50 MCG/ACT nasal spray SPRAY 1 SPRAY INTO EACH NOSTRIL EVERY DAY   furosemide (LASIX) 40 MG tablet TAKE 1 TABLET BY  MOUTH  DAILY   glucose blood (ONETOUCH VERIO) test strip Use as directed to check blood sugars 2 times per day dx: e11.65   HYDROcodone-acetaminophen (NORCO) 10-325 MG tablet Take 1 tablet by mouth 5 (five) times daily as needed.   levothyroxine (SYNTHROID) 125 MCG tablet Take 125 mcg by mouth daily before breakfast.   metoprolol succinate (TOPROL-XL) 50 MG 24 hr tablet TAKE 1 TABLET BY MOUTH  DAILY   omeprazole (PRILOSEC) 20 MG capsule TAKE 1 CAPSULE BY MOUTH  DAILY   OneTouch Delica Lancets 81X MISC Use as directed to check blood sugars 2 times per day dx: e11.65   pravastatin (PRAVACHOL) 40 MG tablet TAKE 1 TABLET BY MOUTH EVERY DAY   telmisartan (MICARDIS) 80 MG tablet TAKE 1 TABLET BY MOUTH  DAILY   triamcinolone (KENALOG) 0.1 % APPLY TO AFFECTED AREA TWICE A DAY   No current facility-administered medications for this visit. (Other)   REVIEW OF SYSTEMS: ROS   Positive for: Skin, Endocrine, Cardiovascular, Eyes Negative for: Constitutional, Gastrointestinal, Neurological, Genitourinary, Musculoskeletal, HENT, Respiratory, Psychiatric, Allergic/Imm, Heme/Lymph Last edited by Debbrah Alar, COT on 08/27/2021  8:12 AM.      ALLERGIES No Known Allergies  PAST MEDICAL HISTORY Past Medical History:  Diagnosis Date   Arthritis    osteoarthritis   Cataract    OS   Diabetes mellitus  Insulin dependent   Diabetic retinopathy (HCC)    NPDR OU   Glaucoma    POAG OD   H/O cardiovascular stress test    pt. reports 10/13- was normal per pt.    Hyperlipemia    Hypertension    Hypertensive retinopathy    OU   Past Surgical History:  Procedure Laterality Date   CARPAL TUNNEL RELEASE Bilateral    CATARACT EXTRACTION Right 10/05/2018   Dr. Kathlen Mody   EYE SURGERY Right    Cat Sx   KNEE ARTHROSCOPY     right   QUADRICEPS TENDON REPAIR  02/17/2012   Procedure: REPAIR QUADRICEP TENDON;  Surgeon: Vickey Huger, MD;  Location: [place];  Service: Orthopedics;  Laterality: Right;  right  quadriceps repair with latera release   QUADRICEPS TENDON REPAIR Right 05/15/2012   Procedure: REPAIR QUADRICEP TENDON;  Surgeon: Vickey Huger, MD;  Location: [place];  Service: Orthopedics;  Laterality: Right;   TOTAL KNEE ARTHROPLASTY  10/21/2011   Procedure: TOTAL KNEE ARTHROPLASTY;  Surgeon: Rudean Haskell, MD;  Location: [place];  Service: Orthopedics;  Laterality: Right;   TUBAL LIGATION     FAMILY HISTORY Family History  Problem Relation Age of Onset   Hypertension Mother    Heart Problems Father    Gout Father    Arthritis Father    SOCIAL HISTORY Social History   Tobacco Use   Smoking status: Former    Packs/day: 1.00    Years: 30.00    Total pack years: 30.00    Types: Cigarettes    Start date: 01/15/1967    Quit date: 06/28/2014    Years since quitting: 7.1   Smokeless tobacco: Never  Vaping Use   Vaping Use: Never used  Substance Use Topics   Alcohol use: No   Drug use: Yes    Types: Hydrocodone       OPHTHALMIC EXAM: Base Eye Exam     Visual Acuity (Snellen - Linear)       Right Left   Dist Waterproof 20/100 +2 HM   Dist ph [place] NI          Tonometry (Tonopen, 8:18 AM)       Right Left   Pressure 14 20         Pupils       Dark Light Shape React APD   Right 2 1 Round Slow None   Left 2 1 Round Slow None         Visual Fields (Counting fingers)       Left Right     Full   Restrictions Total superior temporal, inferior temporal, superior nasal, inferior nasal deficiencies          Extraocular Movement       Right Left    Full, Ortho Full, Ortho         Neuro/Psych     Oriented x3: Yes   Mood/Affect: Normal         Dilation     Both eyes: 1.0% Mydriacyl, 2.5% Phenylephrine @ 8:18 AM           Slit Lamp and Fundus Exam     Slit Lamp Exam       Right Left   Lids/Lashes Dermatochalasis - upper lid, mild Meibomian gland dysfunction Dermatochalasis - upper lid, mild Meibomian gland dysfunction   Conjunctiva/Sclera Melanosis  Melanosis   Cornea Trace, Punctate epithelial erosions, well healed temporal cataract wounds, arcus, trace EBMD Mild arcus,  trace PEE   Anterior Chamber Deep and quiet Deep and quiet   Iris Round and poorly dilated to 3.44m Round and moderately dilated to 648m  Lens PC IOL in good position, trace, cortical remnant temporally, PC folds 3+ Nuclear sclerosis with brunescense, 3+ Cortical cataract   Anterior Vitreous Vitreous syneresis, Ozurdex pellet remnants inferiorly, mild blood stained vitreous condensations -- persistent, residual IVK in IT quad Vitreous syneresis         Fundus Exam       Right Left   Disc 2-3+Pallor, Sharp rim, temporal PPP/PPA, thin superior rim, +cupping, vascular loops, +heme nasally - improved, disc heme at 0700 - improved Mild Pallor, Sharp rim, PPP, severely attenuated vessels   C/D Ratio 0.7 0.3   Macula Blunted foveal reflex, Drusen, Retinal pigment epithelial mottling, temporal cystic changes with exudates and MA -- improved, punctate exudates - improved, persistent IRH greatest temporal macula Flat, Blunted foveal reflex, RPE mottling and clumping, scarring, atrophy, no heme   Vessels attenuated, Tortuous Severe Vascular attenuation, +fibrosis   Periphery Attached, scattered 360 MA and DBH -- slightly improved Attached, pavingstone and reticular degeneration inferiorly, scattered RPE atrophy, greatest peripapillary             IMAGING AND PROCEDURES  Imaging and Procedures for _0 @  OCT, Retina - OU - Both Eyes       Right Eye Quality was good. Central Foveal Thickness: 185. Progression has been stable. Findings include no IRF, no SRF, abnormal foveal contour, intraretinal hyper-reflective material, vitreomacular adhesion (Mild interval improvement in cystic changes temporal macula, persistent vitreous opacities, interval improvement in focal pockets of SRF inferior periphery caught on widefield).   Left Eye Quality was borderline. Central Foveal  Thickness: 343. Progression has been stable. Findings include no SRF, abnormal foveal contour, subretinal hyper-reflective material, epiretinal membrane, intraretinal fluid, pigment epithelial detachment, outer retinal atrophy, preretinal fibrosis (Scattered IRF/cystic changes, persistent central ORA / SRHM).   Notes *Images captured and stored on drive  Diagnosis / Impression:  OD: Mild interval improvement in cystic changes temporal macula, persistent vitreous opacities, interval improvement in focal pockets of SRF inferior periphery caught on widefield OS: Scattered IRF/cystic changes, persistent central ORA / SRHM  Clinical management:  See below  Abbreviations: NFP - Normal foveal profile. CME - cystoid macular edema. PED - pigment epithelial detachment. IRF - intraretinal fluid. SRF - subretinal fluid. EZ - ellipsoid zone. ERM - epiretinal membrane. ORA - outer retinal atrophy. ORT - outer retinal tubulation. SRHM - subretinal hyper-reflective material      Intravitreal Injection, Pharmacologic Agent - OD - Right Eye       Time Out 08/27/2021. 8:33 AM. Confirmed correct patient, procedure, site, and patient consented.   Anesthesia Topical anesthesia was used. Anesthetic medications included Lidocaine 2%, Proparacaine 0.5%.   Procedure Preparation included 5% betadine to ocular surface, eyelid speculum. A (32g) needle was used.   Injection: 1.25 mg Bevacizumab 1.2528m.05ml   Route: Intravitreal, Site: Right Eye   NDC: [id number]: 3: [id number] date: 09/22/2021   Post-op Post injection exam found visual acuity of at least counting fingers, no retinal detachment, perfused optic nerve. The patient tolerated the procedure well. There were no complications. The patient received written and verbal post procedure care education.              ASSESSMENT/PLAN:    ICD-10-CM   1. Central retinal vein occlusion with macular edema of both eyes  H34.8130 OCT, Retina -  OU -  Both Eyes    Intravitreal Injection, Pharmacologic Agent - OD - Right Eye    2. Severe nonproliferative diabetic retinopathy of both eyes with macular edema associated with type 2 diabetes mellitus ([place])  K59.9357     3. Essential hypertension  I10     4. Hypertensive retinopathy of both eyes  H35.033     5. 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 Pseudophakia  Z96.1     7. Primary open angle glaucoma (POAG) of right eye, mild stage  H40.1111       1. CRVO w/ CME OU  - pt of Dr. Zigmund Daniel who wished to transfer care  - OS long-standing low vision (CF) with subfoveal scar and central retinal atrophy  - OD with CME actively being managed with Ozurdex OD ~q5 wks by Dr. Zigmund Daniel (last on 8.20.20) s/p multiple IVTA and Ozurdex OD  - was due for Ozurdex OD w/ Zigmund Daniel on 9.25.20, but underwent cataract surgery OD w/ Kathlen Mody on 9.21.20  - s/p IVTA OD #4 (10.26.20), #5 (12.10.20), #6 (01.21.21), #7 (03.05.21), #8 (04.16.21), #9 (05.28.21), #10 (07.09.21), #11 (09.14.21), #12 (10.29.21), #13 (12.17.21), #14 (01.27.22), #15 (03.11.2022), #16 (05.04.22), #17 (06.17.22), #18 (07.29.22), #19 (09.16.22), #20 (10.28.22), #21 (12.16.22), #22 (02.03.23) #23 (04.07.23), #24 (06.14.23)  - s/p IVA OD #1 (07.17.23)  - BCVA improved to 20/100+2 from 20/150  - exam shows mild improvement in blood stained vit condensations; +residual IVTA in vit  - OCT OD: Mild interval improvement in cystic changes temporal macula, persistent vitreous opacities, interval improvement in focal pockets of SRF inferior periphery caught on widefield; OS: persistent central ORA / SRHM  - recommend IVA OD #2 today, 08.14.23  - pt wishes to proceed with injection  - RBA of procedure discussed, questions answered - informed consent obtained and signed - see procedure note - IVA informed consent obtained and signed, 07.17.23 (OD)  - IVTA informed consent obtained and re-signed on 06.14.23  - f/u 4  weeks -- DFE/OCT/possible injection  2. Severe Non-proliferative diabetic retinopathy OU.  - OCT shows diabetic macular edema, OD -- slightly increased  - s/p IVTA OD on 06.14.23 as above  - s/p IVA OD #1 on 07.17.23 as above for DME as well as CRVO  3,4. Hypertensive retinopathy OU  - discussed importance of tight BP control  - monitor  5. Mixed form age related cataract OS  - under the expert management of [facility]  6. Pseudophakia OD  - s/p CE/IOL OD (9.21.20) by expert surgeon, Dr. Kathlen Mody  - IOL in excellent position  - had post op IOP spike -- IOP now under better control  - monitor  7. POAG OD  - now under the expert management of Dr. Edilia Bo (initial consult 07.27.23)  - s/p SLT and goniotomy OD w/ Dr. Kathlen Mody  - had post-cataract IOP spike  - on brimonidine TID OU and timolol 0.5% BID OU  - IOP 14 today  - AC taps post IVTA injections   Ophthalmic Meds Ordered this visit:  No orders of the defined types were placed in this encounter.    Return in about 4 weeks (around 09/24/2021) for f/u CRVO OU, DFE, OCT.  There are no Patient Instructions on file for this visit.   This document serves as a record of services personally performed by Gardiner Sleeper, MD, PhD. It was created on their behalf by Roselee Nova, COMT. The creation of this record  is the provider's dictation and/or activities during the visit.  Electronically signed by: Roselee Nova, COMT 08/27/21 8:51 AM  This document serves as a record of services personally performed by Gardiner Sleeper, MD, PhD. It was created on their behalf by San Jetty. Owens Shark, OA an ophthalmic technician. The creation of this record is the provider's dictation and/or activities during the visit.    Electronically signed by: San Jetty. Owens Shark, [place] 08.14.2023 8:51 AM  Gardiner Sleeper, M.D., Ph.D. Diseases & Surgery of the Retina and Vitreous [facility]  I have reviewed the above documentation for accuracy  and completeness, and I agree with the above. Gardiner Sleeper, M.D., Ph.D. 08/27/21 8:53 AM   Abbreviations: M myopia (nearsighted); A astigmatism; H hyperopia (farsighted); P presbyopia; Mrx spectacle prescription;  CTL contact lenses; OD right eye; OS left eye; OU both eyes  XT exotropia; ET esotropia; PEK punctate epithelial keratitis; PEE punctate epithelial erosions; DES dry eye syndrome; MGD meibomian gland dysfunction; ATs artificial tears; PFAT's preservative free artificial tears; [place] nuclear sclerotic cataract; PSC posterior subcapsular cataract; ERM epi-retinal membrane; PVD posterior vitreous detachment; RD retinal detachment; DM diabetes mellitus; DR diabetic retinopathy; NPDR non-proliferative diabetic retinopathy; PDR proliferative diabetic retinopathy; CSME clinically significant macular edema; DME diabetic macular edema; dbh dot blot hemorrhages; CWS cotton wool spot; POAG primary open angle glaucoma; C/D cup-to-disc ratio; HVF humphrey visual field; GVF goldmann visual field; OCT optical coherence tomography; IOP intraocular pressure; BRVO Branch retinal vein occlusion; CRVO central retinal vein occlusion; CRAO central retinal artery occlusion; BRAO branch retinal artery occlusion; RT retinal tear; SB scleral buckle; PPV pars plana vitrectomy; VH Vitreous hemorrhage; PRP panretinal laser photocoagulation; IVK intravitreal kenalog; VMT vitreomacular traction; MH Macular hole;  NVD neovascularization of the disc; NVE neovascularization elsewhere; AREDS age related eye disease study; ARMD age related macular degeneration; POAG primary open angle glaucoma; EBMD epithelial/anterior basement membrane dystrophy; ACIOL anterior chamber intraocular lens; IOL intraocular lens; PCIOL posterior chamber intraocular lens; Phaco/IOL phacoemulsification with intraocular lens placement; Central photorefractive keratectomy; LASIK laser assisted in situ keratomileusis; HTN hypertension; DM diabetes mellitus; COPD  chronic obstructive pulmonary disease

## 2021-08-23 ENCOUNTER — Encounter: Payer: Self-pay | Admitting: Vascular Surgery

## 2021-08-23 ENCOUNTER — Ambulatory Visit (INDEPENDENT_AMBULATORY_CARE_PROVIDER_SITE_OTHER): Payer: Medicare (Managed Care) | Admitting: Vascular Surgery

## 2021-08-23 ENCOUNTER — Ambulatory Visit (HOSPITAL_COMMUNITY)
Admission: RE | Admit: 2021-08-23 | Discharge: 2021-08-23 | Disposition: A | Discharge status: Home / Self Care | Payer: Medicare (Managed Care) | Source: Ambulatory Visit | Attending: Vascular Surgery | Admitting: Vascular Surgery

## 2021-08-23 VITALS — BP 184/84 | HR 66 | Temp 98.1°F | Resp 20 | Ht 67.0 in | Wt 320.0 lb

## 2021-08-23 DIAGNOSIS — I739 Peripheral vascular disease, unspecified: Secondary | ICD-10-CM | POA: Diagnosis not present

## 2021-08-23 DIAGNOSIS — L819 Disorder of pigmentation, unspecified: Secondary | ICD-10-CM | POA: Diagnosis not present

## 2021-08-23 DIAGNOSIS — I890 Lymphedema, not elsewhere classified: Secondary | ICD-10-CM | POA: Diagnosis not present

## 2021-08-23 MED ORDER — CEPHALEXIN 500 MG PO CAPS: 500.0000 mg | ORAL_CAPSULE | Freq: Three times a day (TID) | ORAL | 1 refills | Status: DC

## 2021-08-23 NOTE — Progress Notes (Signed)
ASSESSMENT & PLAN   LYMPHEDEMA: This patient has severe lymphedema bilaterally but more significantly on the left side.  Unfortunately I think the lymphedema on the left is really gotten out of control.  I have encouraged her to try to use her lymphedema pump for 2 hours a day.  We have also discussed the proper positioning for leg elevation.  Unfortunately she has been nonambulatory so she cannot exercise.  Given the massive size of the left leg compression therapy is challenging.   LEFT GREAT TOE WOUND: She does have a wound on her left great toe.  We were unable to do noninvasive studies because of her dressing and the large size of her leg.  The wound does appear to have decent perfusion.  Therefore I will not jump right into an arteriogram given that she would be at increased risk for arteriography given her obesity and severe lymphedema.  She does have a history of some chronic kidney disease and certainly if she required CO2 arteriography I think it be very poor visualization of the tibial vessels.  I did debride the toe and instructed her to soak the foot daily in lukewarm Dial soap soaks.  I have also written for dressing changes to the toe.  I will then see her back in 3 to 4 weeks.  If the toe does not show signs of improvement we would have to proceed with arteriography.  She does understand that this is a limb threatening problem.   REASON FOR CONSULT:    Right foot discoloration.  "Questionable vascular problem.".  The consult is requested by Dr. Ezequiel Kayser.    HPI:   Mary [name] is a 76 y.o. female who is referred with severe lymphedema and also a wound on her left great toe.  On my history the patient has had lymphedema for years but this has gradually gotten worse.  I think she spends a lot of time sitting with her legs dependent.  She has been nonambulatory since May of this year.  She does have a lymphedema pump but is only able to use it for about an hour 3 times a  week when her nurses aide comes to the home.  She is unable to get in on herself otherwise.  She denies any rest pain.  She developed a wound on her left great toe and is not sure how long she has had this.  I do not get any history of rest pain.  Her most concerning comorbidities are her diabetes and morbid obesity.  Her BMI is 50.  Her risk factors for peripheral arterial disease include diabetes, hypertension, hyperlipidemia, and a remote history of tobacco use.  She quit smoking in 2016.  Past Medical History:  Diagnosis Date   Arthritis    osteoarthritis   Cataract    OS   Diabetes mellitus    Insulin dependent   Diabetic retinopathy (HCC)    NPDR OU   Glaucoma    POAG OD   H/O cardiovascular stress test    pt. reports 10/13- was normal per pt.    Hyperlipemia    Hypertension    Hypertensive retinopathy    OU    Family History  Problem Relation Age of Onset   Hypertension Mother    Heart Problems Father    Gout Father    Arthritis Father     SOCIAL HISTORY: Social History   Tobacco Use   Smoking status: Former    Packs/day:  1.00    Years: 30.00    Total pack years: 30.00    Types: Cigarettes    Start date: 01/15/1967    Quit date: 06/28/2014    Years since quitting: 7.1   Smokeless tobacco: Never  Substance Use Topics   Alcohol use: No    No Known Allergies  Current Outpatient Medications  Medication Sig Dispense Refill   aspirin 81 MG chewable tablet 1 tablet     Assure Comfort Lancets 28G MISC      Blood Glucose Monitoring Suppl (ONETOUCH VERIO FLEX SYSTEM) w/Device KIT Use as directed to check blood sugars 2 times per day dx: e11.65 1 kit 1   brimonidine (ALPHAGAN) 0.2 % ophthalmic solution INSTILL 1 DROP INTO BOTH EYES TWICE A DAY 10 mL 5   cetirizine (ZYRTEC) 10 MG tablet Take 1 tablet (10 mg total) by mouth daily. 90 tablet 1   fluticasone (FLONASE) 50 MCG/ACT nasal spray SPRAY 1 SPRAY INTO EACH NOSTRIL EVERY DAY 32 mL 1   furosemide (LASIX) 40 MG  tablet TAKE 1 TABLET BY MOUTH  DAILY 90 tablet 3   glucose blood (ONETOUCH VERIO) test strip Use as directed to check blood sugars 2 times per day dx: e11.65 100 each 3   HYDROcodone-acetaminophen (NORCO) 10-325 MG tablet Take 1 tablet by mouth 5 (five) times daily as needed.     levothyroxine (SYNTHROID) 125 MCG tablet Take 125 mcg by mouth daily before breakfast.     metoprolol succinate (TOPROL-XL) 50 MG 24 hr tablet TAKE 1 TABLET BY MOUTH  DAILY 90 tablet 1   omeprazole (PRILOSEC) 20 MG capsule TAKE 1 CAPSULE BY MOUTH  DAILY 90 capsule 3   OneTouch Delica Lancets 91Q MISC Use as directed to check blood sugars 2 times per day dx: e11.65 100 each 3   pravastatin (PRAVACHOL) 40 MG tablet TAKE 1 TABLET BY MOUTH EVERY DAY 90 tablet 1   telmisartan (MICARDIS) 80 MG tablet TAKE 1 TABLET BY MOUTH  DAILY 90 tablet 3   timolol (BETIMOL) 0.25 % ophthalmic solution Place 1 drop into the right eye 2 (two) times daily.     triamcinolone (KENALOG) 0.1 % APPLY TO AFFECTED AREA TWICE A DAY 30 g 0   No current facility-administered medications for this visit.    REVIEW OF SYSTEMS:  [X]  denotes positive finding, [ ]  denotes negative finding Cardiac  Comments:  Chest pain or chest pressure:    Shortness of breath upon exertion:    Short of breath when lying flat:    Irregular heart rhythm:        Vascular    Pain in calf, thigh, or hip brought on by ambulation:    Pain in feet at night that wakes you up from your sleep:     Blood clot in your veins:    Leg swelling:  x       Pulmonary    Oxygen at home:    Productive cough:     Wheezing:         Neurologic    Sudden weakness in arms or legs:     Sudden numbness in arms or legs:     Sudden onset of difficulty speaking or slurred speech:    Temporary loss of vision in one eye:     Problems with dizziness:         Gastrointestinal    Blood in stool:     Vomited blood:  Genitourinary    Burning when urinating:     Blood in urine:         Psychiatric    Major depression:         Hematologic    Bleeding problems:    Problems with blood clotting too easily:        Skin    Rashes or ulcers: x Left great toe      Constitutional    Fever or chills:    -  PHYSICAL EXAM:   Vitals:   08/23/21 1428  BP: (!) 184/84  Pulse: 66  Resp: 20  Temp: 98.1 F (36.7 C)  SpO2: 97%  Weight: (!) 320 lb (145.2 kg)  Height: 5' 7"  (1.702 m)   Body mass index is 50.12 kg/m. GENERAL: The patient is a well-nourished female, in no acute distress. The vital signs are documented above. CARDIAC: There is a regular rate and rhythm.  VASCULAR: I do not detect carotid bruits. We were unable to get her out of the wheelchair because of her lack of mobility and size.  This made it difficult to assess her femoral pulses.  However she appears to have a normal femoral pulse on the right and a diminished femoral pulse on the left.  This may just be from the difficulty of the exam however. Given the dressings on both legs as shown in the photographs below we were unable to obtain Doppler signals. She has severe bilateral lower extremity swelling which is more significant on the left side.     PULMONARY: There is good air exchange bilaterally without wheezing or rales. ABDOMEN: Soft and non-tender with normal pitched bowel sounds.  MUSCULOSKELETAL: There are no major deformities. NEUROLOGIC: No focal weakness or paresthesias are detected. SKIN: She has a wound on the medial aspect of her left great toe which appears well-perfused.   PSYCHIATRIC: The patient has a normal affect.  DATA:    ARTERIAL DOPPLER STUDY: I have reviewed her arterial Doppler study that was done today.  This was a very limited study.  Study was limited by the patient's body habitus mobility and dressings.  On the right, the only thing that could be obtained was a dampened digital waveform on the right foot.  Doppler signals and ABIs could not be obtained because of  the dressings.    On the left side the only information that could be obtained was a monophasic dorsalis pedis signal  LABS: I do not have any recent labs, however on 12/12/2020 her creatinine was 1.05.  Deitra Mayo [facility]

## 2021-08-24 ENCOUNTER — Encounter (INDEPENDENT_AMBULATORY_CARE_PROVIDER_SITE_OTHER): Payer: Medicare Other | Admitting: Ophthalmology

## 2021-08-27 ENCOUNTER — Ambulatory Visit (INDEPENDENT_AMBULATORY_CARE_PROVIDER_SITE_OTHER): Payer: Medicare (Managed Care) | Admitting: Ophthalmology

## 2021-08-27 ENCOUNTER — Encounter (INDEPENDENT_AMBULATORY_CARE_PROVIDER_SITE_OTHER): Payer: Self-pay | Admitting: Ophthalmology

## 2021-08-27 DIAGNOSIS — H35033 Hypertensive retinopathy, bilateral: Secondary | ICD-10-CM

## 2021-08-27 DIAGNOSIS — E113413 Type 2 diabetes mellitus with severe nonproliferative diabetic retinopathy with macular edema, bilateral: Secondary | ICD-10-CM

## 2021-08-27 DIAGNOSIS — H25812 Combined forms of age-related cataract, left eye: Secondary | ICD-10-CM

## 2021-08-27 DIAGNOSIS — I10 Essential (primary) hypertension: Secondary | ICD-10-CM | POA: Diagnosis not present

## 2021-08-27 DIAGNOSIS — H34813 Central retinal vein occlusion, bilateral, with macular edema: Secondary | ICD-10-CM | POA: Diagnosis not present

## 2021-08-27 DIAGNOSIS — H401111 Primary open-angle glaucoma, right eye, mild stage: Secondary | ICD-10-CM

## 2021-08-27 DIAGNOSIS — Z961 Presence of intraocular lens: Secondary | ICD-10-CM

## 2021-08-27 MED ORDER — BEVACIZUMAB CHEMO INJECTION 1.25MG/0.05ML SYRINGE FOR KALEIDOSCOPE
1.2500 mg | INTRAVITREAL | Status: AC | PRN
  Administered 2021-08-27: 1.25 mg via INTRAVITREAL

## 2021-08-30 ENCOUNTER — Ambulatory Visit: Payer: Medicare (Managed Care) | Admitting: Podiatry

## 2021-09-11 ENCOUNTER — Ambulatory Visit (INDEPENDENT_AMBULATORY_CARE_PROVIDER_SITE_OTHER): Payer: Medicare (Managed Care) | Admitting: Ophthalmology

## 2021-09-11 ENCOUNTER — Encounter (INDEPENDENT_AMBULATORY_CARE_PROVIDER_SITE_OTHER): Payer: Self-pay | Admitting: Ophthalmology

## 2021-09-11 DIAGNOSIS — H34813 Central retinal vein occlusion, bilateral, with macular edema: Secondary | ICD-10-CM | POA: Diagnosis not present

## 2021-09-11 DIAGNOSIS — H35033 Hypertensive retinopathy, bilateral: Secondary | ICD-10-CM | POA: Diagnosis not present

## 2021-09-11 DIAGNOSIS — I10 Essential (primary) hypertension: Secondary | ICD-10-CM

## 2021-09-11 DIAGNOSIS — E113413 Type 2 diabetes mellitus with severe nonproliferative diabetic retinopathy with macular edema, bilateral: Secondary | ICD-10-CM

## 2021-09-11 DIAGNOSIS — Z961 Presence of intraocular lens: Secondary | ICD-10-CM

## 2021-09-11 DIAGNOSIS — H4311 Vitreous hemorrhage, right eye: Secondary | ICD-10-CM

## 2021-09-11 DIAGNOSIS — H401111 Primary open-angle glaucoma, right eye, mild stage: Secondary | ICD-10-CM

## 2021-09-11 DIAGNOSIS — H25812 Combined forms of age-related cataract, left eye: Secondary | ICD-10-CM

## 2021-09-11 NOTE — Progress Notes (Signed)
[facility] Note  09/11/2021     CHIEF COMPLAINT Patient presents for Retina Follow Up    HISTORY OF PRESENT ILLNESS: Mary [name] is a 76 y.o. female who presents to the clinic today for:   HPI     Retina Follow Up   Patient presents with  CRVO/BRVO.  In right eye.  This started months ago.  Severity is moderate.  Duration of 15 days.  Since onset it is stable.  I, the attending physician,  performed the HPI with the patient and updated documentation appropriately.        Comments   Patient feels that the vision in the right eye is worse and wants something to be done about it. She doesn't check her blood sugar.       Last edited by Bernarda Caffey, MD on 09/11/2021 11:05 PM.     Patient feels that the vision in the right eye is getting worse. Saw Dr. Edilia Bo on 09/06/21 for POAG f/u Timolol 0.5% x2 OD Brimonidine x3 OU   Referring physician: Merlene Laughter, MD [address] [place],  [place] [address]  HISTORICAL INFORMATION:   Selected notes from the MEDICAL RECORD NUMBER Transferred care from Dr. Zigmund Daniel   CURRENT MEDICATIONS: Current Outpatient Medications (Ophthalmic Drugs)  Medication Sig   brimonidine (ALPHAGAN) 0.2 % ophthalmic solution INSTILL 1 DROP INTO BOTH EYES TWICE A DAY   timolol (BETIMOL) 0.25 % ophthalmic solution Place 1 drop into the right eye 2 (two) times daily.   No current facility-administered medications for this visit. (Ophthalmic Drugs)   Current Outpatient Medications (Other)  Medication Sig   aspirin 81 MG chewable tablet 1 tablet   Assure Comfort Lancets 28G MISC    Blood Glucose Monitoring Suppl (ONETOUCH VERIO FLEX SYSTEM) w/Device KIT Use as directed to check blood sugars 2 times per day dx: e11.65   cephALEXin (KEFLEX) 500 MG capsule Take 1 capsule (500 mg total) by mouth 3 (three) times daily.   cetirizine (ZYRTEC) 10 MG tablet Take 1 tablet (10 mg total) by mouth daily.   fluticasone  (FLONASE) 50 MCG/ACT nasal spray SPRAY 1 SPRAY INTO EACH NOSTRIL EVERY DAY   furosemide (LASIX) 40 MG tablet TAKE 1 TABLET BY MOUTH  DAILY   glucose blood (ONETOUCH VERIO) test strip Use as directed to check blood sugars 2 times per day dx: e11.65   HYDROcodone-acetaminophen (NORCO) 10-325 MG tablet Take 1 tablet by mouth 5 (five) times daily as needed.   levothyroxine (SYNTHROID) 125 MCG tablet Take 125 mcg by mouth daily before breakfast.   metoprolol succinate (TOPROL-XL) 50 MG 24 hr tablet TAKE 1 TABLET BY MOUTH  DAILY   omeprazole (PRILOSEC) 20 MG capsule TAKE 1 CAPSULE BY MOUTH  DAILY   OneTouch Delica Lancets 41O MISC Use as directed to check blood sugars 2 times per day dx: e11.65   pravastatin (PRAVACHOL) 40 MG tablet TAKE 1 TABLET BY MOUTH EVERY DAY   telmisartan (MICARDIS) 80 MG tablet TAKE 1 TABLET BY MOUTH  DAILY   triamcinolone (KENALOG) 0.1 % APPLY TO AFFECTED AREA TWICE A DAY   No current facility-administered medications for this visit. (Other)   REVIEW OF SYSTEMS: ROS   Positive for: Skin, Endocrine, Cardiovascular, Eyes Negative for: Constitutional, Gastrointestinal, Neurological, Genitourinary, Musculoskeletal, HENT, Respiratory, Psychiatric, Allergic/Imm, Heme/Lymph Last edited by Annie Paras, COT on 09/11/2021  9:23 AM.      ALLERGIES No Known Allergies  PAST MEDICAL HISTORY  Past Medical History:  Diagnosis Date   Arthritis    osteoarthritis   Cataract    OS   Diabetes mellitus    Insulin dependent   Diabetic retinopathy (HCC)    NPDR OU   Glaucoma    POAG OD   H/O cardiovascular stress test    pt. reports 10/13- was normal per pt.    Hyperlipemia    Hypertension    Hypertensive retinopathy    OU   Past Surgical History:  Procedure Laterality Date   CARPAL TUNNEL RELEASE Bilateral    CATARACT EXTRACTION Right 10/05/2018   Dr. Kathlen Mody   EYE SURGERY Right    Cat Sx   KNEE ARTHROSCOPY     right   QUADRICEPS TENDON REPAIR  02/17/2012    Procedure: REPAIR QUADRICEP TENDON;  Surgeon: Vickey Huger, MD;  Location: [facility];  Service: Orthopedics;  Laterality: Right;  right quadriceps repair with latera release   QUADRICEPS TENDON REPAIR Right 05/15/2012   Procedure: REPAIR QUADRICEP TENDON;  Surgeon: Vickey Huger, MD;  Location: [facility];  Service: Orthopedics;  Laterality: Right;   TOTAL KNEE ARTHROPLASTY  10/21/2011   Procedure: TOTAL KNEE ARTHROPLASTY;  Surgeon: Rudean Haskell, MD;  Location: [facility];  Service: Orthopedics;  Laterality: Right;   TUBAL LIGATION     FAMILY HISTORY Family History  Problem Relation Age of Onset   Hypertension Mother    Heart Problems Father    Gout Father    Arthritis Father    SOCIAL HISTORY Social History   Tobacco Use   Smoking status: Former    Packs/day: 1.00    Years: 30.00    Total pack years: 30.00    Types: Cigarettes    Start date: 01/15/1967    Quit date: 06/28/2014    Years since quitting: 7.2   Smokeless tobacco: Never  Vaping Use   Vaping Use: Never used  Substance Use Topics   Alcohol use: No   Drug use: Yes    Types: Hydrocodone       OPHTHALMIC EXAM: Base Eye Exam     Visual Acuity (Snellen - Linear)       Right Left   Dist [place] 20/80 +1 HM   Dist ph [place] NI          Tonometry (Tonopen, 9:26 AM)       Right Left   Pressure 16 19         Pupils       Dark Light Shape React APD   Right 2 1 Round Slow None   Left 2 1 Round Slow None         Visual Fields       Left Right   Restrictions Total superior temporal, inferior temporal, superior nasal, inferior nasal deficiencies          Extraocular Movement       Right Left    Full, Ortho Full, Ortho         Neuro/Psych     Oriented x3: Yes   Mood/Affect: Normal         Dilation     Both eyes: 1.0% Mydriacyl, 2.5% Phenylephrine @ 9:24 AM           Slit Lamp and Fundus Exam     Slit Lamp Exam       Right Left   Lids/Lashes Dermatochalasis - upper lid, mild Meibomian gland  dysfunction Dermatochalasis - upper lid, mild Meibomian gland dysfunction  Conjunctiva/Sclera Melanosis Melanosis   Cornea Trace, Punctate epithelial erosions, well healed temporal cataract wounds, arcus, trace EBMD Mild arcus, trace PEE   Anterior Chamber Deep and quiet Deep and quiet   Iris Round and poorly dilated to 3.79m Round and moderately dilated to 655m  Lens PC IOL in good position, trace, cortical remnant temporally, PC folds 3+ Nuclear sclerosis with brunescense, 3+ Cortical cataract   Anterior Vitreous Vitreous syneresis, Ozurdex pellet remnants inferiorly, mild blood stained vitreous condensations -- now white, residual IVK in IT quad Vitreous syneresis         Fundus Exam       Right Left   Disc 2-3+Pallor, Sharp rim, temporal PPP/PPA, thin superior rim, +cupping, vascular loops, +heme nasally - improved, disc heme at 0700 - improved Mild Pallor, Sharp rim, PPP, severely attenuated vessels   C/D Ratio 0.7 0.3   Macula Blunted foveal reflex, Drusen, Retinal pigment epithelial mottling, temporal cystic changes with exudates and MA -- improved, punctate exudates - improved, persistent IRH greatest temporal macula Flat, Blunted foveal reflex, RPE mottling and clumping, scarring, atrophy, no heme   Vessels attenuated, Tortuous Severe Vascular attenuation, +fibrosis   Periphery Attached, scattered 360 MA and DBH -- slightly improved Attached, pavingstone and reticular degeneration inferiorly, scattered RPE atrophy, greatest peripapillary             Refraction     Wearing Rx       Sphere Cylinder Axis Add   Right -1.00 +0.75 180 +2.25   Left -0.50 +0.75 155 +2.25           IMAGING AND PROCEDURES  Imaging and Procedures for @TODAY @  OCT, Retina - OU - Both Eyes       Right Eye Quality was good. Central Foveal Thickness: 183. Progression has improved. Findings include no IRF, no SRF, abnormal foveal contour, intraretinal hyper-reflective material, vitreomacular  adhesion (Mild interval improvement in cystic changes temporal and superior macula, persistent vitreous opacities--slightly improved, tr persistent focal pockets of SRF inferior periphery caught on widefield).   Left Eye Quality was borderline. Central Foveal Thickness: 239. Progression has been stable. Findings include no SRF, abnormal foveal contour, subretinal hyper-reflective material, epiretinal membrane, intraretinal fluid, pigment epithelial detachment, outer retinal atrophy, preretinal fibrosis (Scattered IRF/cystic changes, persistent central ORA / SRHM).   Notes *Images captured and stored on drive  Diagnosis / Impression:  OD: Mild interval improvement in cystic changes temporal and superior macula, persistent vitreous opacities--slightly improved, tr persistent focal pockets of SRF inferior periphery caught on widefield OS: Scattered IRF/cystic changes, persistent central ORA / SRHM  Clinical management:  See below  Abbreviations: NFP - Normal foveal profile. CME - cystoid macular edema. PED - pigment epithelial detachment. IRF - intraretinal fluid. SRF - subretinal fluid. EZ - ellipsoid zone. ERM - epiretinal membrane. ORA - outer retinal atrophy. ORT - outer retinal tubulation. SRHM - subretinal hyper-reflective material            ASSESSMENT/PLAN:    ICD-10-CM   1. Central retinal vein occlusion with macular edema of both eyes  H34.8130 OCT, Retina - OU - Both Eyes    2. Severe nonproliferative diabetic retinopathy of both eyes with macular edema associated with type 2 diabetes mellitus (HCTeachey E1X10.6269   3. Essential hypertension  I10     4. Hypertensive retinopathy of both eyes  H35.033     5. Combined forms of age-related cataract of left eye  H25.812  6. Pseudophakia  Z96.1     7. Primary open angle glaucoma (POAG) of right eye, mild stage  H40.1111     8. Vitreous hemorrhage of right eye (HCC)  H43.11      **Pt presents for early f/u due to  subjective decrease in vision OD**  - pt states that she "can't see"  - BCVA OD today improved to 20/80 from 20/100 (baseline [facility] in early 2022 was 20/50-20/60)  - OCT shows interval improvement in IRF and vitreous opacities OD  - reassured pt of stable / improving exam and retinal status  1. CRVO w/ CME OU  - pt of Dr. Zigmund Daniel who wished to transfer care  - OS long-standing low vision (CF) with subfoveal scar and central retinal atrophy  - OD with CME actively being managed with Ozurdex OD ~q5 wks by Dr. Zigmund Daniel (last on 8.20.20) s/p multiple IVTA and Ozurdex OD  - was due for Ozurdex OD w/ Zigmund Daniel on 9.25.20, but underwent cataract surgery OD w/ Kathlen Mody on 9.21.20 - s/p IVTA OD #4 (10.26.20), #5 (12.10.20), #6 (01.21.21), #7 (03.05.21), #8 (04.16.21), #9 (05.28.21), #10 (07.09.21), #11 (09.14.21), #12 (10.29.21), #13 (12.17.21), #14 (01.27.22), #15 (03.11.2022), #16 (05.04.22), #17 (06.17.22), #18 (07.29.22), #19 (09.16.22), #20 (10.28.22), #21 (12.16.22), #22 (02.03.23) #23 (04.07.23), #24 (06.14.23)  - s/p IVA OD #1 (07.17.23), #2 (08.14.23)  - BCVA improved to 20/80 from 20/100  - exam shows mild improvement in blood stained vit condensations; IVTA cleared from vitreous  - OCT OD: Mild interval improvement in cystic changes temporal macula, persistent vitreous opacities, interval improvement in focal pockets of SRF inferior periphery caught on widefield; OS: persistent central ORA / SRHM  - not yet due for repeat IVA  - no retinal or ophthalmic interventions indicated or recommended  - IVA informed consent obtained and signed, 07.17.23 (OD)  - IVTA informed consent obtained and re-signed on 06.14.23  - f/u as scheduled 09/24/21 -- DFE/OCT/possible injection  2. Severe Non-proliferative diabetic retinopathy OU.  - OCT shows diabetic macular edema, OD -- slightly increased  - s/p IVTA OD on 06.14.23 as above  - s/p IVA OD #1 on 07.17.23 as above for DME as well as CRVO  3,4.  Hypertensive retinopathy OU  - discussed importance of tight BP control  - continue to monitor  5. Mixed form age related cataract OS  - under the expert management of [facility]  6. Pseudophakia OD  - s/p CE/IOL OD (9.21.20) by expert surgeon, Dr. Kathlen Mody  - IOL in excellent position  - had post op IOP spike -- IOP now under better control  - continue to monitor  7. POAG OD  - now under the expert management of Dr. Edilia Bo (initial consult 07.27.23)  - s/p SLT and goniotomy OD w/ Dr. Kathlen Mody  - had post-cataract IOP spike  - on brimonidine TID OU and timolol 0.5% BID OU  - IOP 16 today  - AC taps post IVTA injections   Ophthalmic Meds Ordered this visit:  No orders of the defined types were placed in this encounter.    Return for as scheduled -- appointment 09/24/21.  There are no Patient Instructions on file for this visit.   This document serves as a record of services personally performed by Gardiner Sleeper, MD, PhD. It was created on their behalf by Renaldo Reel, [place] an ophthalmic technician. The creation of this record is the provider's dictation and/or activities during the visit.    Electronically signed  by:  Renaldo Reel, COT  09/11/21 11:05 PM   Gardiner Sleeper, M.D., Ph.D. Diseases & Surgery of the Retina and Vitreous [facility]  I have reviewed the above documentation for accuracy and completeness, and I agree with the above. Gardiner Sleeper, M.D., Ph.D. 09/11/21 11:11 PM   Abbreviations: M myopia (nearsighted); A astigmatism; H hyperopia (farsighted); P presbyopia; Mrx spectacle prescription;  CTL contact lenses; OD right eye; OS left eye; OU both eyes  XT exotropia; ET esotropia; PEK punctate epithelial keratitis; PEE punctate epithelial erosions; DES dry eye syndrome; MGD meibomian gland dysfunction; ATs artificial tears; PFAT's preservative free artificial tears; Kline nuclear sclerotic cataract; PSC posterior subcapsular  cataract; ERM epi-retinal membrane; PVD posterior vitreous detachment; RD retinal detachment; DM diabetes mellitus; DR diabetic retinopathy; NPDR non-proliferative diabetic retinopathy; PDR proliferative diabetic retinopathy; CSME clinically significant macular edema; DME diabetic macular edema; dbh dot blot hemorrhages; CWS cotton wool spot; POAG primary open angle glaucoma; C/D cup-to-disc ratio; HVF humphrey visual field; GVF goldmann visual field; OCT optical coherence tomography; IOP intraocular pressure; BRVO Branch retinal vein occlusion; CRVO central retinal vein occlusion; CRAO central retinal artery occlusion; BRAO branch retinal artery occlusion; RT retinal tear; SB scleral buckle; PPV pars plana vitrectomy; VH Vitreous hemorrhage; PRP panretinal laser photocoagulation; IVK intravitreal kenalog; VMT vitreomacular traction; MH Macular hole;  NVD neovascularization of the disc; NVE neovascularization elsewhere; AREDS age related eye disease study; ARMD age related macular degeneration; POAG primary open angle glaucoma; EBMD epithelial/anterior basement membrane dystrophy; ACIOL anterior chamber intraocular lens; IOL intraocular lens; PCIOL posterior chamber intraocular lens; Phaco/IOL phacoemulsification with intraocular lens placement; Itmann photorefractive keratectomy; LASIK laser assisted in situ keratomileusis; HTN hypertension; DM diabetes mellitus; COPD chronic obstructive pulmonary disease

## 2021-09-20 ENCOUNTER — Ambulatory Visit (INDEPENDENT_AMBULATORY_CARE_PROVIDER_SITE_OTHER): Payer: Medicare (Managed Care) | Admitting: Vascular Surgery

## 2021-09-20 ENCOUNTER — Other Ambulatory Visit: Payer: Self-pay

## 2021-09-20 ENCOUNTER — Encounter: Payer: Self-pay | Admitting: Vascular Surgery

## 2021-09-20 VITALS — BP 197/97 | HR 64 | Temp 97.8°F | Resp 20 | Ht 67.0 in | Wt 320.0 lb

## 2021-09-20 DIAGNOSIS — I739 Peripheral vascular disease, unspecified: Secondary | ICD-10-CM

## 2021-09-20 DIAGNOSIS — I890 Lymphedema, not elsewhere classified: Secondary | ICD-10-CM

## 2021-09-20 NOTE — Progress Notes (Signed)
REASON FOR VISIT:   Follow-up of lymphedema and left great toe wound.  MEDICAL ISSUES:   LYMPHEDEMA: There has been no significant change in her leg swelling.  She does tell me that she is using her lymphedema pump more than before.  She is also been trying to elevate her legs.  I have again encouraged her to use the pump at least 2 hours a day and also to combine this with leg elevation is much as possible.  She should try to avoid sitting with her legs dependent is much as possible.  LEFT GREAT TOE WOUND: The left great toe wound has improved and appears to be adequately perfused.  It is difficult to assess her circulation because of the leg swelling.  I will try to get formal ABIs when she returns in 3 months.  We are trying to avoid arteriography given the risk because of her obesity (BMI 50).  In addition she has some underlying chronic kidney disease and CO2 arteriography does not provide detailed visualization of the tibial vessels where she may have some underlying disease.   HPI:   Mary [name] is a pleasant 76 y.o. female who I saw in consultation on 08/23/2021 with severe lymphedema and a wound on her left great toe.  The patient had lymphedema for years but this is gradually gotten worse.  She spends a lot of time with her legs dependent.  She has been nonambulatory since May.  She does have a lymphedema pump but is only able to use this for about 3 times a week for an hour.  On exam her BMI was 50.  I encouraged her at that time to continue to use her lymphedema pump for at least 2 hours a day.  We also discussed the importance of the proper position for leg elevation.  She had a wound on her left great toe.  We were unable to do noninvasive studies because of the dressing and large size of her leg.  The wound did appear to have decent perfusion.  Therefore I did not want to rush right into an arteriogram given her obesity and increased risk.  She also has a history of some chronic  kidney disease and will require CO2 arteriography which would be associated with poor visualization of the tibials.  Instructed her to soak her toe daily in lukewarm Dial soap soaks.  She comes in for 3 to 4-week follow-up visit.  Since I saw her last, she has been trying to use her lymphedema pump more often and also trying to elevate her legs is much as possible.  She states that the wound on her toe has improved significantly.  Past Medical History:  Diagnosis Date   Arthritis    osteoarthritis   Cataract    OS   Diabetes mellitus    Insulin dependent   Diabetic retinopathy (HCC)    NPDR OU   Glaucoma    POAG OD   H/O cardiovascular stress test    pt. reports 10/13- was normal per pt.    Hyperlipemia    Hypertension    Hypertensive retinopathy    OU    Family History  Problem Relation Age of Onset   Hypertension Mother    Heart Problems Father    Gout Father    Arthritis Father     SOCIAL HISTORY: Social History   Tobacco Use   Smoking status: Former    Packs/day: 1.00    Years: 30.00  Total pack years: 30.00    Types: Cigarettes    Start date: 01/15/1967    Quit date: 06/28/2014    Years since quitting: 7.2   Smokeless tobacco: Never  Substance Use Topics   Alcohol use: No    No Known Allergies  Current Outpatient Medications  Medication Sig Dispense Refill   aspirin 81 MG chewable tablet 1 tablet     Assure Comfort Lancets 28G MISC      Blood Glucose Monitoring Suppl (ONETOUCH VERIO FLEX SYSTEM) w/Device KIT Use as directed to check blood sugars 2 times per day dx: e11.65 1 kit 1   brimonidine (ALPHAGAN) 0.2 % ophthalmic solution INSTILL 1 DROP INTO BOTH EYES TWICE A DAY 10 mL 5   cephALEXin (KEFLEX) 500 MG capsule Take 1 capsule (500 mg total) by mouth 3 (three) times daily. 21 capsule 1   cetirizine (ZYRTEC) 10 MG tablet Take 1 tablet (10 mg total) by mouth daily. 90 tablet 1   fluticasone (FLONASE) 50 MCG/ACT nasal spray SPRAY 1 SPRAY INTO EACH  NOSTRIL EVERY DAY 32 mL 1   furosemide (LASIX) 40 MG tablet TAKE 1 TABLET BY MOUTH  DAILY 90 tablet 3   glucose blood (ONETOUCH VERIO) test strip Use as directed to check blood sugars 2 times per day dx: e11.65 100 each 3   HYDROcodone-acetaminophen (NORCO) 10-325 MG tablet Take 1 tablet by mouth 5 (five) times daily as needed.     levothyroxine (SYNTHROID) 125 MCG tablet Take 125 mcg by mouth daily before breakfast.     metoprolol succinate (TOPROL-XL) 50 MG 24 hr tablet TAKE 1 TABLET BY MOUTH  DAILY 90 tablet 1   omeprazole (PRILOSEC) 20 MG capsule TAKE 1 CAPSULE BY MOUTH  DAILY 90 capsule 3   OneTouch Delica Lancets 35T MISC Use as directed to check blood sugars 2 times per day dx: e11.65 100 each 3   pravastatin (PRAVACHOL) 40 MG tablet TAKE 1 TABLET BY MOUTH EVERY DAY 90 tablet 1   telmisartan (MICARDIS) 80 MG tablet TAKE 1 TABLET BY MOUTH  DAILY 90 tablet 3   timolol (BETIMOL) 0.25 % ophthalmic solution Place 1 drop into the right eye 2 (two) times daily.     triamcinolone (KENALOG) 0.1 % APPLY TO AFFECTED AREA TWICE A DAY 30 g 0   No current facility-administered medications for this visit.    REVIEW OF SYSTEMS:  [X]  denotes positive finding, [ ]  denotes negative finding Cardiac  Comments:  Chest pain or chest pressure:    Shortness of breath upon exertion: x   Short of breath when lying flat:    Irregular heart rhythm:        Vascular    Pain in calf, thigh, or hip brought on by ambulation:    Pain in feet at night that wakes you up from your sleep:     Blood clot in your veins:    Leg swelling:  x       Pulmonary    Oxygen at home:    Productive cough:     Wheezing:         Neurologic    Sudden weakness in arms or legs:     Sudden numbness in arms or legs:     Sudden onset of difficulty speaking or slurred speech:    Temporary loss of vision in one eye:     Problems with dizziness:         Gastrointestinal    Blood in stool:  Vomited blood:          Genitourinary    Burning when urinating:     Blood in urine:        Psychiatric    Major depression:         Hematologic    Bleeding problems:    Problems with blood clotting too easily:        Skin    Rashes or ulcers:        Constitutional    Fever or chills:     PHYSICAL EXAM:   Vitals:   09/20/21 1106  BP: (!) 197/97  Pulse: 64  Resp: 20  Temp: 97.8 F (36.6 C)  SpO2: 96%  Weight: (!) 320 lb (145.2 kg)  Height: 5' 7"  (1.702 m)    GENERAL: The patient is a well-nourished female, in no acute distress. The vital signs are documented above. CARDIAC: There is a regular rate and rhythm.  VASCULAR: She has significant bilateral lower extremity swelling which is more significant on the left side consistent with advanced lymphedema. Because of the swelling I cannot assess her pulses. PULMONARY: There is good air exchange bilaterally without wheezing or rales. MUSCULOSKELETAL: There are no major deformities or cyanosis. NEUROLOGIC: No focal weakness or paresthesias are detected. SKIN: The wound on the left great toe has improved as documented in the photograph below.     PSYCHIATRIC: The patient has a normal affect.  DATA:    No new data  Deitra Mayo [facility] [phone]

## 2021-09-24 ENCOUNTER — Encounter (INDEPENDENT_AMBULATORY_CARE_PROVIDER_SITE_OTHER): Payer: Medicare (Managed Care) | Admitting: Ophthalmology

## 2021-09-24 DIAGNOSIS — E113413 Type 2 diabetes mellitus with severe nonproliferative diabetic retinopathy with macular edema, bilateral: Secondary | ICD-10-CM

## 2021-09-24 DIAGNOSIS — Z961 Presence of intraocular lens: Secondary | ICD-10-CM

## 2021-09-24 DIAGNOSIS — H25812 Combined forms of age-related cataract, left eye: Secondary | ICD-10-CM

## 2021-09-24 DIAGNOSIS — I10 Essential (primary) hypertension: Secondary | ICD-10-CM

## 2021-09-24 DIAGNOSIS — H34813 Central retinal vein occlusion, bilateral, with macular edema: Secondary | ICD-10-CM

## 2021-09-24 DIAGNOSIS — H401111 Primary open-angle glaucoma, right eye, mild stage: Secondary | ICD-10-CM

## 2021-09-24 DIAGNOSIS — H35033 Hypertensive retinopathy, bilateral: Secondary | ICD-10-CM

## 2021-09-25 NOTE — Progress Notes (Shared)
[facility] Note  09/27/2021     CHIEF COMPLAINT Patient presents for Retina Follow Up    HISTORY OF PRESENT ILLNESS: Mary [name] is a 76 y.o. female who presents to the clinic today for:   HPI     Retina Follow Up   Patient presents with  CRVO/BRVO.  In right eye.  This started months ago.  Severity is moderate.  Duration of 15 days.  Since onset it is stable.  I, the attending physician,  performed the HPI with the patient and updated documentation appropriately.        Comments   Patient feels that the vision is the same, still cant see.       Last edited by Bernarda Caffey, MD on 09/27/2021  5:44 PM.    Pt states no change in vision, everything is still dark, she is using brimonidine TID and timolol BID  Referring physician: Merlene Laughter, MD [address] [place],  [place] [address]  HISTORICAL INFORMATION:   Selected notes from the MEDICAL RECORD NUMBER Transferred care from Dr. Zigmund Daniel   CURRENT MEDICATIONS: Current Outpatient Medications (Ophthalmic Drugs)  Medication Sig   dorzolamide-timolol (COSOPT) 22.3-6.8 MG/ML ophthalmic solution Place 1 drop into the right eye 2 (two) times daily.   brimonidine (ALPHAGAN) 0.2 % ophthalmic solution INSTILL 1 DROP INTO BOTH EYES TWICE A DAY   timolol (BETIMOL) 0.25 % ophthalmic solution Place 1 drop into the right eye 2 (two) times daily.   No current facility-administered medications for this visit. (Ophthalmic Drugs)   Current Outpatient Medications (Other)  Medication Sig   aspirin 81 MG chewable tablet 1 tablet   Assure Comfort Lancets 28G MISC    Blood Glucose Monitoring Suppl (ONETOUCH VERIO FLEX SYSTEM) w/Device KIT Use as directed to check blood sugars 2 times per day dx: e11.65   cephALEXin (KEFLEX) 500 MG capsule Take 1 capsule (500 mg total) by mouth 3 (three) times daily.   cetirizine (ZYRTEC) 10 MG tablet Take 1 tablet (10 mg total) by mouth daily.   fluticasone  (FLONASE) 50 MCG/ACT nasal spray SPRAY 1 SPRAY INTO EACH NOSTRIL EVERY DAY   furosemide (LASIX) 40 MG tablet TAKE 1 TABLET BY MOUTH  DAILY   glucose blood (ONETOUCH VERIO) test strip Use as directed to check blood sugars 2 times per day dx: e11.65   HYDROcodone-acetaminophen (NORCO) 10-325 MG tablet Take 1 tablet by mouth 5 (five) times daily as needed.   levothyroxine (SYNTHROID) 125 MCG tablet Take 125 mcg by mouth daily before breakfast.   metoprolol succinate (TOPROL-XL) 50 MG 24 hr tablet TAKE 1 TABLET BY MOUTH  DAILY   omeprazole (PRILOSEC) 20 MG capsule TAKE 1 CAPSULE BY MOUTH  DAILY   OneTouch Delica Lancets 98J MISC Use as directed to check blood sugars 2 times per day dx: e11.65   pravastatin (PRAVACHOL) 40 MG tablet TAKE 1 TABLET BY MOUTH EVERY DAY   telmisartan (MICARDIS) 80 MG tablet TAKE 1 TABLET BY MOUTH  DAILY   triamcinolone (KENALOG) 0.1 % APPLY TO AFFECTED AREA TWICE A DAY   No current facility-administered medications for this visit. (Other)   REVIEW OF SYSTEMS: ROS   Positive for: Skin, Endocrine, Cardiovascular, Eyes Negative for: Constitutional, Gastrointestinal, Neurological, Genitourinary, Musculoskeletal, HENT, Respiratory, Psychiatric, Allergic/Imm, Heme/Lymph Last edited by Annie Paras, COT on 09/27/2021  9:43 AM.     ALLERGIES No Known Allergies  PAST MEDICAL HISTORY Past Medical History:  Diagnosis Date  Arthritis    osteoarthritis   Cataract    OS   Diabetes mellitus    Insulin dependent   Diabetic retinopathy (HCC)    NPDR OU   Glaucoma    POAG OD   H/O cardiovascular stress test    pt. reports 10/13- was normal per pt.    Hyperlipemia    Hypertension    Hypertensive retinopathy    OU   Past Surgical History:  Procedure Laterality Date   CARPAL TUNNEL RELEASE Bilateral    CATARACT EXTRACTION Right 10/05/2018   Dr. Kathlen Mody   EYE SURGERY Right    Cat Sx   KNEE ARTHROSCOPY     right   QUADRICEPS TENDON REPAIR  02/17/2012    Procedure: REPAIR QUADRICEP TENDON;  Surgeon: Vickey Huger, MD;  Location: [place];  Service: Orthopedics;  Laterality: Right;  right quadriceps repair with latera release   QUADRICEPS TENDON REPAIR Right 05/15/2012   Procedure: REPAIR QUADRICEP TENDON;  Surgeon: Vickey Huger, MD;  Location: [place] [facility];  Service: Orthopedics;  Laterality: Right;   TOTAL KNEE ARTHROPLASTY  10/21/2011   Procedure: TOTAL KNEE ARTHROPLASTY;  Surgeon: Rudean Haskell, MD;  Location: [facility];  Service: Orthopedics;  Laterality: Right;   TUBAL LIGATION     FAMILY HISTORY Family History  Problem Relation Age of Onset   Hypertension Mother    Heart Problems Father    Gout Father    Arthritis Father    SOCIAL HISTORY Social History   Tobacco Use   Smoking status: Former    Packs/day: 1.00    Years: 30.00    Total pack years: 30.00    Types: Cigarettes    Start date: 01/15/1967    Quit date: 06/28/2014    Years since quitting: 7.2   Smokeless tobacco: Never  Vaping Use   Vaping Use: Never used  Substance Use Topics   Alcohol use: No   Drug use: Yes    Types: Hydrocodone       OPHTHALMIC EXAM: Base Eye Exam     Visual Acuity (Snellen - Linear)       Right Left   Dist [place] 20/80 +1 HM   Dist ph [place] NI          Tonometry (Tonopen, 9:36 AM)       Right Left   Pressure 18 19         Pupils       Dark Light Shape React APD   Right 2 1 Round Slow None   Left 2 1 Round Slow None         Visual Fields       Left Right     Full   Restrictions Total superior temporal, inferior temporal, superior nasal, inferior nasal deficiencies          Extraocular Movement       Right Left    Full, Ortho Full, Ortho         Neuro/Psych     Oriented x3: Yes   Mood/Affect: Normal         Dilation     Both eyes: 1.0% Mydriacyl, 2.5% Phenylephrine @ 9:35 AM           Slit Lamp and Fundus Exam     Slit Lamp Exam       Right Left   Lids/Lashes Dermatochalasis - upper lid, mild Meibomian  gland dysfunction Dermatochalasis - upper lid, mild Meibomian gland dysfunction   Conjunctiva/Sclera Melanosis Melanosis  Cornea Trace, Punctate epithelial erosions, well healed temporal cataract wounds, arcus, trace EBMD Mild arcus, trace PEE   Anterior Chamber Deep and quiet Deep and quiet   Iris Round and poorly dilated to 3.50m Round and moderately dilated to 690m  Lens PC IOL in good position, trace, cortical remnant temporally, PC folds 3+ Nuclear sclerosis with brunescense, 3+ Cortical cataract   Anterior Vitreous Vitreous syneresis, Ozurdex pellet remnants inferiorly, mild blood stained vitreous condensations -- now white, residual IVK in IT quad Vitreous syneresis         Fundus Exam       Right Left   Disc 2-3+Pallor, Sharp rim, temporal PPP/PPA, thin superior rim, +cupping, vascular loops, +heme nasally - improved, disc heme at 0700 - improved Mild Pallor, Sharp rim, PPP, severely attenuated vessels   C/D Ratio 0.7 0.3   Macula Blunted foveal reflex, [place], Retinal pigment epithelial mottling, temporal cystic changes with exudates and MA -- improved, punctate exudates - improved, persistent IRH greatest temporal macula Flat, Blunted foveal reflex, RPE mottling and clumping, scarring, atrophy, no heme   Vessels Severe attenuation, tortuosity Severe Vascular attenuation, +fibrosis   Periphery Attached, scattered DBH -- slightly improved Attached, pavingstone and reticular degeneration inferiorly, scattered RPE atrophy, greatest peripapillary             Refraction     Wearing Rx       Sphere Cylinder Axis Add   Right -1.00 +0.75 180 +2.25   Left -0.50 +0.75 155 +2.25           IMAGING AND PROCEDURES  Imaging and Procedures for @TODAY @  OCT, Retina - OU - Both Eyes       Right Eye Quality was good. Central Foveal Thickness: 184. Progression has improved. Findings include normal foveal contour, no IRF, no SRF, intraretinal hyper-reflective material, vitreomacular  adhesion (Stable improvement in cystic changes temporal and superior macula, mild interval improvement in vitreous opacities).   Left Eye Quality was borderline. Central Foveal Thickness: 242. Progression has been stable. Findings include no SRF, abnormal foveal contour, subretinal hyper-reflective material, epiretinal membrane, intraretinal fluid, pigment epithelial detachment, outer retinal atrophy, preretinal fibrosis (Scattered IRF/cystic changes, persistent central ORA / SRHM).   Notes *Images captured and stored on drive  Diagnosis / Impression:  OD: Stable improvement in cystic changes temporal and superior macula, mild interval improvement in vitreous opacities OS: Scattered IRF/cystic changes, persistent central ORA / SRHM  Clinical management:  See below  Abbreviations: NFP - Normal foveal profile. CME - cystoid macular edema. PED - pigment epithelial detachment. IRF - intraretinal fluid. SRF - subretinal fluid. EZ - ellipsoid zone. ERM - epiretinal membrane. ORA - outer retinal atrophy. ORT - outer retinal tubulation. SRHM - subretinal hyper-reflective material      Intravitreal Injection, Pharmacologic Agent - OD - Right Eye       Time Out 09/27/2021. 9:57 AM. Confirmed correct patient, procedure, site, and patient consented.   Anesthesia Topical anesthesia was used. Anesthetic medications included Lidocaine 2%, Proparacaine 0.5%.   Procedure Preparation included 5% betadine to ocular surface, eyelid speculum. A (32g) needle was used.   Injection: 1.25 mg Bevacizumab 1.255m.05ml   Route: Intravitreal, Site: Right Eye   NDC: 50242-060-01, Lot: 2: [id number] date: 11/06/2021   Post-op Post injection exam found visual acuity of at least counting fingers, no retinal detachment, perfused optic nerve. The patient tolerated the procedure well. There were no complications. The patient received written and verbal post procedure care education.  ASSESSMENT/PLAN:    ICD-10-CM   1. Central retinal vein occlusion with macular edema of both eyes  H34.8130 OCT, Retina - OU - Both Eyes    Intravitreal Injection, Pharmacologic Agent - OD - Right Eye    Bevacizumab (AVASTIN) SOLN 1.25 mg    2. Severe nonproliferative diabetic retinopathy of both eyes with macular edema associated with type 2 diabetes mellitus ([place])  Q20.6015     3. Essential hypertension  I10     4. Hypertensive retinopathy of both eyes  H35.033     5. 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 Pseudophakia  Z96.1     7. Primary open angle glaucoma (POAG) of right eye, mild stage  H40.1111      1. CRVO w/ CME OU  - pt of Dr. Zigmund Daniel who wished to transfer care  - OS long-standing low vision (CF) with subfoveal scar and central retinal atrophy  - OD with CME actively being managed with Ozurdex OD ~q5 wks by Dr. Zigmund Daniel (last on 8.20.20) s/p multiple IVTA and Ozurdex OD  - was due for Ozurdex OD w/ Zigmund Daniel on 9.25.20, but underwent cataract surgery OD w/ Kathlen Mody on 9.21.20 - s/p IVTA OD #4 (10.26.20), #5 (12.10.20), #6 (01.21.21), #7 (03.05.21), #8 (04.16.21), #9 (05.28.21), #10 (07.09.21), #11 (09.14.21), #12 (10.29.21), #13 (12.17.21), #14 (01.27.22), #15 (03.11.2022), #16 (05.04.22), #17 (06.17.22), #18 (07.29.22), #19 (09.16.22), #20 (10.28.22), #21 (12.16.22), #22 (02.03.23) #23 (04.07.23), #24 (06.14.23)  - s/p IVA OD #1 (07.17.23), #2 (08.14.23)  - BCVA improved to 20/80  - exam shows mild improvement in blood stained vit condensations; IVTA cleared from vitreous  - OCT OD: Stable improvement in cystic changes temporal and superior macula, mild interval improvement in vitreous opacities; OS: Scattered IRF/cystic changes, persistent central ORA / SRHM at 4 weeks  - recommend IVA OD #3 today, 09.14.23 with follow up extended to 5 weeks  - pt wishes to proceed with injection  - RBA of procedure discussed, questions answered - informed  consent obtained and signed - see procedure note - IVA informed consent obtained and signed, 07.17.23 (OD)  - IVTA informed consent obtained and re-signed on 06.14.23  - f/u 5 weeks -- DFE/OCT/possible injection  2. Severe Non-proliferative diabetic retinopathy OU.  - OCT shows diabetic macular edema, OD -- slightly increased  - s/p IVTA OD on 06.14.23 as above  - s/p IVA OD #1 on 07.17.23 as above for DME as well as CRVO  3,4. Hypertensive retinopathy OU  - discussed importance of tight BP control  - continue to monitor  5. Mixed form age related cataract OS  - under the expert management of [facility]  6. Pseudophakia OD  - s/p CE/IOL OD (9.21.20) by expert surgeon, Dr. Kathlen Mody  - IOL in excellent position  - had post op IOP spike -- IOP now under better control  - continue to monitor  7. POAG OD  - now under the expert management of Dr. Edilia Bo (initial consult 07.27.23)  - s/p SLT and goniotomy OD w/ Dr. Kathlen Mody  - had post-cataract IOP spike  - on brimonidine TID OU and timolol 0.5% BID OU -- will stop timolol and add Cosopt BID  - IOP 18 today  - AC taps post IVTA injections   Ophthalmic Meds Ordered this visit:  Meds ordered this encounter  Medications   Bevacizumab (AVASTIN) SOLN 1.25 mg   dorzolamide-timolol (COSOPT) 22.3-6.8 MG/ML ophthalmic solution  Sig: Place 1 drop into the right eye 2 (two) times daily.    Dispense:  10 mL    Refill:  6     Return in about 5 weeks (around 11/01/2021) for f/u CRVO OU, DFE, OCT.  There are no Patient Instructions on file for this visit.   This document serves as a record of services personally performed by Gardiner Sleeper, MD, PhD. It was created on their behalf by San Jetty. Owens Shark, OA an ophthalmic technician. The creation of this record is the provider's dictation and/or activities during the visit.    Electronically signed by: San Jetty. Owens Shark, [place] 09.12.2023 2:17 AM  Gardiner Sleeper, M.D., Ph.D. Diseases & Surgery  of the Retina and Vitreous [facility]  I have reviewed the above documentation for accuracy and completeness, and I agree with the above. Gardiner Sleeper, M.D., Ph.D. 09/30/21 2:17 AM   Abbreviations: M myopia (nearsighted); A astigmatism; H hyperopia (farsighted); P presbyopia; Mrx spectacle prescription;  CTL contact lenses; OD right eye; OS left eye; OU both eyes  XT exotropia; ET esotropia; PEK punctate epithelial keratitis; PEE punctate epithelial erosions; DES dry eye syndrome; MGD meibomian gland dysfunction; ATs artificial tears; PFAT's preservative free artificial tears; [place] nuclear sclerotic cataract; PSC posterior subcapsular cataract; ERM epi-retinal membrane; PVD posterior vitreous detachment; RD retinal detachment; DM diabetes mellitus; DR diabetic retinopathy; NPDR non-proliferative diabetic retinopathy; PDR proliferative diabetic retinopathy; CSME clinically significant macular edema; DME diabetic macular edema; dbh dot blot hemorrhages; CWS cotton wool spot; POAG primary open angle glaucoma; C/D cup-to-disc ratio; HVF humphrey visual field; GVF goldmann visual field; OCT optical coherence tomography; IOP intraocular pressure; BRVO Branch retinal vein occlusion; CRVO central retinal vein occlusion; CRAO central retinal artery occlusion; BRAO branch retinal artery occlusion; RT retinal tear; SB scleral buckle; PPV pars plana vitrectomy; VH Vitreous hemorrhage; PRP panretinal laser photocoagulation; IVK intravitreal kenalog; VMT vitreomacular traction; MH Macular hole;  NVD neovascularization of the disc; NVE neovascularization elsewhere; AREDS age related eye disease study; ARMD age related macular degeneration; POAG primary open angle glaucoma; EBMD epithelial/anterior basement membrane dystrophy; ACIOL anterior chamber intraocular lens; IOL intraocular lens; PCIOL posterior chamber intraocular lens; Phaco/IOL phacoemulsification with intraocular lens placement; [place]  photorefractive keratectomy; LASIK laser assisted in situ keratomileusis; HTN hypertension; DM diabetes mellitus; COPD chronic obstructive pulmonary disease

## 2021-09-27 ENCOUNTER — Ambulatory Visit (INDEPENDENT_AMBULATORY_CARE_PROVIDER_SITE_OTHER): Payer: Medicare (Managed Care) | Admitting: Ophthalmology

## 2021-09-27 ENCOUNTER — Encounter (INDEPENDENT_AMBULATORY_CARE_PROVIDER_SITE_OTHER): Payer: Self-pay | Admitting: Ophthalmology

## 2021-09-27 DIAGNOSIS — H35033 Hypertensive retinopathy, bilateral: Secondary | ICD-10-CM

## 2021-09-27 DIAGNOSIS — Z961 Presence of intraocular lens: Secondary | ICD-10-CM

## 2021-09-27 DIAGNOSIS — E113413 Type 2 diabetes mellitus with severe nonproliferative diabetic retinopathy with macular edema, bilateral: Secondary | ICD-10-CM

## 2021-09-27 DIAGNOSIS — H34813 Central retinal vein occlusion, bilateral, with macular edema: Secondary | ICD-10-CM

## 2021-09-27 DIAGNOSIS — I10 Essential (primary) hypertension: Secondary | ICD-10-CM | POA: Diagnosis not present

## 2021-09-27 DIAGNOSIS — H4311 Vitreous hemorrhage, right eye: Secondary | ICD-10-CM

## 2021-09-27 DIAGNOSIS — H25812 Combined forms of age-related cataract, left eye: Secondary | ICD-10-CM

## 2021-09-27 DIAGNOSIS — H401111 Primary open-angle glaucoma, right eye, mild stage: Secondary | ICD-10-CM

## 2021-09-27 MED ORDER — BEVACIZUMAB CHEMO INJECTION 1.25MG/0.05ML SYRINGE FOR KALEIDOSCOPE
1.2500 mg | INTRAVITREAL | Status: AC | PRN
  Administered 2021-09-27: 1.25 mg via INTRAVITREAL

## 2021-09-28 MED ORDER — DORZOLAMIDE HCL-TIMOLOL MAL 2-0.5 % OP SOLN: 1.0000 [drp] | Freq: Two times a day (BID) | OPHTHALMIC | 6 refills | Status: DC

## 2021-10-05 ENCOUNTER — Other Ambulatory Visit (INDEPENDENT_AMBULATORY_CARE_PROVIDER_SITE_OTHER): Payer: Self-pay

## 2021-10-05 MED ORDER — DORZOLAMIDE HCL-TIMOLOL MAL 2-0.5 % OP SOLN: 1.0000 [drp] | Freq: Two times a day (BID) | OPHTHALMIC | 1 refills | Status: DC

## 2021-10-25 NOTE — Progress Notes (Signed)
[facility] Note  10/31/2021     CHIEF COMPLAINT Patient presents for Retina Follow Up    HISTORY OF PRESENT ILLNESS: Mary [name] is a 76 y.o. female who presents to the clinic today for:   HPI     Retina Follow Up   Patient presents with  CRVO/BRVO.  In right eye.  This started months ago.  Severity is moderate.  Duration of 5 days.  Since onset it is stable.  I, the attending physician,  performed the HPI with the patient and updated documentation appropriately.        Comments   5 week retina follow up CRVO OU IVA OD Patient states no changes in her vision she denies flashes or floaters       Last edited by Bernarda Caffey, MD on 10/31/2021 12:19 PM.      Referring physician: Merlene Laughter, MD [address] [place],  [place] [address]  HISTORICAL INFORMATION:   Selected notes from the MEDICAL RECORD NUMBER Transferred care from Dr. Zigmund Daniel   CURRENT MEDICATIONS: Current Outpatient Medications (Ophthalmic Drugs)  Medication Sig   brimonidine (ALPHAGAN) 0.2 % ophthalmic solution INSTILL 1 DROP INTO BOTH EYES TWICE A DAY   dorzolamide-timolol (COSOPT) 22.3-6.8 MG/ML ophthalmic solution Place 1 drop into the right eye 2 (two) times daily.   dorzolamide-timolol (COSOPT) 22.3-6.8 MG/ML ophthalmic solution Place 1 drop into both eyes 2 (two) times daily.   timolol (BETIMOL) 0.25 % ophthalmic solution Place 1 drop into the right eye 2 (two) times daily.   No current facility-administered medications for this visit. (Ophthalmic Drugs)   Current Outpatient Medications (Other)  Medication Sig   aspirin 81 MG chewable tablet 1 tablet   Assure Comfort Lancets 28G MISC    Blood Glucose Monitoring Suppl (ONETOUCH VERIO FLEX SYSTEM) w/Device KIT Use as directed to check blood sugars 2 times per day dx: e11.65   cephALEXin (KEFLEX) 500 MG capsule Take 1 capsule (500 mg total) by mouth 3 (three) times daily.   cetirizine (ZYRTEC) 10 MG  tablet Take 1 tablet (10 mg total) by mouth daily.   fluticasone (FLONASE) 50 MCG/ACT nasal spray SPRAY 1 SPRAY INTO EACH NOSTRIL EVERY DAY   furosemide (LASIX) 40 MG tablet TAKE 1 TABLET BY MOUTH  DAILY   glucose blood (ONETOUCH VERIO) test strip Use as directed to check blood sugars 2 times per day dx: e11.65   HYDROcodone-acetaminophen (NORCO) 10-325 MG tablet Take 1 tablet by mouth 5 (five) times daily as needed.   levothyroxine (SYNTHROID) 125 MCG tablet Take 125 mcg by mouth daily before breakfast.   metoprolol succinate (TOPROL-XL) 50 MG 24 hr tablet TAKE 1 TABLET BY MOUTH  DAILY   omeprazole (PRILOSEC) 20 MG capsule TAKE 1 CAPSULE BY MOUTH  DAILY   OneTouch Delica Lancets 70B MISC Use as directed to check blood sugars 2 times per day dx: e11.65   pravastatin (PRAVACHOL) 40 MG tablet TAKE 1 TABLET BY MOUTH EVERY DAY   telmisartan (MICARDIS) 80 MG tablet TAKE 1 TABLET BY MOUTH  DAILY   triamcinolone (KENALOG) 0.1 % APPLY TO AFFECTED AREA TWICE A DAY   No current facility-administered medications for this visit. (Other)   REVIEW OF SYSTEMS:   ALLERGIES No Known Allergies  PAST MEDICAL HISTORY Past Medical History:  Diagnosis Date   Arthritis    osteoarthritis   Cataract    OS   Diabetes mellitus    Insulin dependent  Diabetic retinopathy ([place])    NPDR OU   Glaucoma    POAG OD   H/O cardiovascular stress test    pt. reports 10/13- was normal per pt.    Hyperlipemia    Hypertension    Hypertensive retinopathy    OU   Past Surgical History:  Procedure Laterality Date   CARPAL TUNNEL RELEASE Bilateral    CATARACT EXTRACTION Right 10/05/2018   Dr. Kathlen Mody   EYE SURGERY Right    Cat Sx   KNEE ARTHROSCOPY     right   QUADRICEPS TENDON REPAIR  02/17/2012   Procedure: REPAIR QUADRICEP TENDON;  Surgeon: Vickey Huger, MD;  Location: [facility];  Service: Orthopedics;  Laterality: Right;  right quadriceps repair with latera release   QUADRICEPS TENDON REPAIR Right 05/15/2012    Procedure: REPAIR QUADRICEP TENDON;  Surgeon: Vickey Huger, MD;  Location: [facility];  Service: Orthopedics;  Laterality: Right;   TOTAL KNEE ARTHROPLASTY  10/21/2011   Procedure: TOTAL KNEE ARTHROPLASTY;  Surgeon: Rudean Haskell, MD;  Location: [facility];  Service: Orthopedics;  Laterality: Right;   TUBAL LIGATION     FAMILY HISTORY Family History  Problem Relation Age of Onset   Hypertension Mother    Heart Problems Father    Gout Father    Arthritis Father    SOCIAL HISTORY Social History   Tobacco Use   Smoking status: Former    Packs/day: 1.00    Years: 30.00    Total pack years: 30.00    Types: Cigarettes    Start date: 01/15/1967    Quit date: 06/28/2014    Years since quitting: 7.3   Smokeless tobacco: Never  Vaping Use   Vaping Use: Never used  Substance Use Topics   Alcohol use: No   Drug use: Yes    Types: Hydrocodone       OPHTHALMIC EXAM: Base Eye Exam     Visual Acuity (Snellen - Linear)       Right Left   Dist cc 20/100 -2 HM   Dist ph cc NI NI    Correction: Glasses         Tonometry (Tonopen, 9:42 AM)       Right Left   Pressure 15 15         Pupils       Dark Light Shape React APD   Right 2 1 Round Sluggish None   Left 2 1 Round Sluggish None         Visual Fields       Left Right   Restrictions Total superior temporal, inferior temporal, superior nasal, inferior nasal deficiencies          Extraocular Movement       Right Left    Full, Ortho Full, Ortho         Neuro/Psych     Oriented x3: Yes   Mood/Affect: Normal         Dilation     Both eyes: 2.5% Phenylephrine @ 9:42 AM           Slit Lamp and Fundus Exam     Slit Lamp Exam       Right Left   Lids/Lashes Dermatochalasis - upper lid, mild Meibomian gland dysfunction Dermatochalasis - upper lid, mild Meibomian gland dysfunction   Conjunctiva/Sclera Melanosis Melanosis   Cornea Trace, Punctate epithelial erosions, well healed temporal cataract wounds,  arcus, trace EBMD Mild arcus, trace PEE   Anterior Chamber Deep and  quiet Deep and quiet   Iris Round and poorly dilated to 3.55m Round and moderately dilated to 642m  Lens PC IOL in good position, trace, cortical remnant temporally, PC folds 3+ Nuclear sclerosis with brunescense, 3+ Cortical cataract   Anterior Vitreous Vitreous syneresis, Ozurdex pellet remnants inferiorly, mild blood stained vitreous condensations -- now white Vitreous syneresis         Fundus Exam       Right Left   Disc 2-3+Pallor, Sharp rim, temporal PPP/PPA, thin superior rim, +cupping, vascular loops, +heme nasally - improved, disc heme at 0700 - improved Mild Pallor, Sharp rim, PPP, severely attenuated vessels   C/D Ratio 0.7 0.3   Macula Blunted foveal reflex, Drusen, Retinal pigment epithelial mottling, temporal cystic changes with exudates and MA -- improved, punctate exudates - improved, persistent IRH greatest inferior macula Flat, Blunted foveal reflex, RPE mottling and clumping, scarring, atrophy, no heme   Vessels Severe attenuation, tortuosity Severe Vascular attenuation, +fibrosis   Periphery Attached, 360 DBH -- slightly improved, greatest posteriorly Attached, pavingstone and reticular degeneration inferiorly, scattered RPE atrophy, greatest peripapillary             Refraction     Wearing Rx       Sphere Cylinder Axis Add   Right -1.00 +0.75 180 +2.25   Left -0.50 +0.75 155 +2.25           IMAGING AND PROCEDURES  Imaging and Procedures for _0 @  OCT, Retina - OU - Both Eyes       Right Eye Quality was good. Central Foveal Thickness: 183. Progression has improved. Findings include normal foveal contour, no IRF, no SRF, intraretinal hyper-reflective material, vitreomacular adhesion (Stable improvement in cystic changes temporal and superior macula, mild interval improvement in vitreous opacities).   Left Eye Quality was borderline. Central Foveal Thickness: 227. Progression has  been stable. Findings include no SRF, abnormal foveal contour, subretinal hyper-reflective material, epiretinal membrane, intraretinal fluid, pigment epithelial detachment, outer retinal atrophy, preretinal fibrosis (Scattered IRF/cystic changes, persistent central ORA / SRHM).   Notes *Images captured and stored on drive  Diagnosis / Impression:  OD: Stable improvement in cystic changes temporal and superior macula, mild interval improvement in vitreous opacities OS: Scattered IRF/cystic changes, persistent central ORA / SRHM  Clinical management:  See below  Abbreviations: NFP - Normal foveal profile. CME - cystoid macular edema. PED - pigment epithelial detachment. IRF - intraretinal fluid. SRF - subretinal fluid. EZ - ellipsoid zone. ERM - epiretinal membrane. ORA - outer retinal atrophy. ORT - outer retinal tubulation. SRHM - subretinal hyper-reflective material      Intravitreal Injection, Pharmacologic Agent - OD - Right Eye       Time Out 10/31/2021. 10:34 AM. Confirmed correct patient, procedure, site, and patient consented.   Anesthesia Topical anesthesia was used. Anesthetic medications included Lidocaine 2%, Proparacaine 0.5%.   Procedure Preparation included 5% betadine to ocular surface, eyelid speculum. A (32g) needle was used.   Injection: 1.25 mg Bevacizumab 1.2560m.05ml   Route: Intravitreal, Site: Right Eye   NDC: 50242-060-01, Lot: 2: [id number] date: 11/28/2021   Post-op Post injection exam found visual acuity of at least counting fingers, no retinal detachment, perfused optic nerve. The patient tolerated the procedure well. There were no complications. The patient received written and verbal post procedure care education.            ASSESSMENT/PLAN:    ICD-10-CM   1. Central retinal vein occlusion with macular edema of  both eyes  H34.8130 OCT, Retina - OU - Both Eyes    Intravitreal Injection, Pharmacologic Agent - OD - Right Eye     Bevacizumab (AVASTIN) SOLN 1.25 mg    2. Severe nonproliferative diabetic retinopathy of both eyes with macular edema associated with type 2 diabetes mellitus ([place])  L49.1791     3. Essential hypertension  I10     4. Hypertensive retinopathy of both eyes  H35.033     5. 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 Pseudophakia  Z96.1     7. Primary open angle glaucoma (POAG) of right eye, mild stage  H40.1111      1. CRVO w/ CME OU  - pt of Dr. Zigmund Daniel who wished to transfer care  - OS long-standing low vision (CF) with subfoveal scar and central retinal atrophy  - OD with CME actively being managed with Ozurdex OD ~q5 wks by Dr. Zigmund Daniel (last on 8.20.20) s/p multiple IVTA and Ozurdex OD  - was due for Ozurdex OD w/ Zigmund Daniel on 9.25.20, but underwent cataract surgery OD w/ Kathlen Mody on 9.21.20 - s/p IVTA OD #4 (10.26.20), #5 (12.10.20), #6 (01.21.21), #7 (03.05.21), #8 (04.16.21), #9 (05.28.21), #10 (07.09.21), #11 (09.14.21), #12 (10.29.21), #13 (12.17.21), #14 (01.27.22), #15 (03.11.2022), #16 (05.04.22), #17 (06.17.22), #18 (07.29.22), #19 (09.16.22), #20 (10.28.22), #21 (12.16.22), #22 (02.03.23) #23 (04.07.23), #24 (06.14.23)  - s/p IVA OD #1 (07.17.23), #2 (08.14.23), #3 (09.14.23)  - BCVA improved to 20/80  - exam shows mild improvement in blood stained vit condensations; IVTA cleared from vitreous  - OCT OD: Stable improvement in cystic changes temporal and superior macula, mild interval improvement in vitreous opacities; OS: Scattered IRF/cystic changes, persistent central ORA / SRHM at 5 weeks  - recommend IVA OD #4 today, 10.18.23 with follow up in 5-6 weeks  - pt wishes to proceed with injection  - RBA of procedure discussed, questions answered - informed consent obtained and signed - see procedure note  - IVA informed consent obtained and signed, 07.17.23 (OD)  - IVTA informed consent obtained and re-signed on 06.14.23  - f/u 5-6 weeks --  DFE/OCT/possible injection  2. Severe Non-proliferative diabetic retinopathy OU.  - OCT shows diabetic macular edema, OD -- slightly increased  - s/p IVTA OD on 06.14.23 as above  - s/p IVA OD #1 on 07.17.23 as above for DME as well as CRVO  3,4. Hypertensive retinopathy OU  - discussed importance of tight BP control  - continue to monitor  5. Mixed form age related cataract OS  - under the [facility] of [facility]  6. Pseudophakia OD  - s/p CE/IOL OD (9.21.20) by expert surgeon, Dr. Kathlen Mody  - IOL in excellent position  - had post op IOP spike -- IOP now under better control  - continue to monitor  7. POAG OD  - now under the expert management of Dr. Edilia Bo (initial consult 07.27.23)  - s/p SLT and goniotomy OD w/ Dr. Kathlen Mody  - had post-cataract IOP spike  - on Brimonidine TID OU and Cosopt BID OU  - IOP 15 today  - AC taps post IVTA injections   Ophthalmic Meds Ordered this visit:  Meds ordered this encounter  Medications   Bevacizumab (AVASTIN) SOLN 1.25 mg     Return in about 5 weeks (around 12/05/2021) for f/u CRVO w/ CME OD .  There are no Patient Instructions on file for this visit.   This  document serves as a record of services personally performed by Gardiner Sleeper, MD, PhD. It was created on their behalf by Orvan Falconer, an ophthalmic technician. The creation of this record is the provider's dictation and/or activities during the visit.    Electronically signed by: Orvan Falconer, OA, 10/31/21  12:19 PM  This document serves as a record of services personally performed by Gardiner Sleeper, MD, PhD. It was created on their behalf by Renaldo Reel, [place] an ophthalmic technician. The creation of this record is the provider's dictation and/or activities during the visit.    Electronically signed by:  Renaldo Reel, COT  10.18.23 12:19 PM  Gardiner Sleeper, M.D., Ph.D. Diseases & Surgery of the Retina and Vitreous [facility]  I have reviewed the above documentation for accuracy and completeness, and I agree with the above. Gardiner Sleeper, M.D., Ph.D. 10/31/21 12:21 PM   Abbreviations: M myopia (nearsighted); A astigmatism; H hyperopia (farsighted); P presbyopia; Mrx spectacle prescription;  CTL contact lenses; OD right eye; OS left eye; OU both eyes  XT exotropia; ET esotropia; PEK punctate epithelial keratitis; PEE punctate epithelial erosions; DES dry eye syndrome; MGD meibomian gland dysfunction; ATs artificial tears; PFAT's preservative free artificial tears; [place] nuclear sclerotic cataract; PSC posterior subcapsular cataract; ERM epi-retinal membrane; PVD posterior vitreous detachment; RD retinal detachment; DM diabetes mellitus; DR diabetic retinopathy; NPDR non-proliferative diabetic retinopathy; PDR proliferative diabetic retinopathy; CSME clinically significant macular edema; DME diabetic macular edema; dbh dot blot hemorrhages; CWS cotton wool spot; POAG primary open angle glaucoma; C/D cup-to-disc ratio; HVF humphrey visual field; GVF goldmann visual field; OCT optical coherence tomography; IOP intraocular pressure; BRVO Branch retinal vein occlusion; CRVO central retinal vein occlusion; CRAO central retinal artery occlusion; BRAO branch retinal artery occlusion; RT retinal tear; SB scleral buckle; PPV pars plana vitrectomy; VH Vitreous hemorrhage; PRP panretinal laser photocoagulation; IVK intravitreal kenalog; VMT vitreomacular traction; MH Macular hole;  NVD neovascularization of the disc; NVE neovascularization elsewhere; AREDS age related eye disease study; ARMD age related macular degeneration; POAG primary open angle glaucoma; EBMD epithelial/anterior basement membrane dystrophy; ACIOL anterior chamber intraocular lens; IOL intraocular lens; PCIOL posterior chamber intraocular lens; Phaco/IOL phacoemulsification with intraocular lens placement; [place] photorefractive keratectomy; LASIK laser assisted in  situ keratomileusis; HTN hypertension; DM diabetes mellitus; COPD chronic obstructive pulmonary disease

## 2021-10-31 ENCOUNTER — Encounter (INDEPENDENT_AMBULATORY_CARE_PROVIDER_SITE_OTHER): Payer: Self-pay | Admitting: Ophthalmology

## 2021-10-31 ENCOUNTER — Ambulatory Visit (INDEPENDENT_AMBULATORY_CARE_PROVIDER_SITE_OTHER): Payer: Medicare (Managed Care) | Admitting: Ophthalmology

## 2021-10-31 DIAGNOSIS — H4311 Vitreous hemorrhage, right eye: Secondary | ICD-10-CM

## 2021-10-31 DIAGNOSIS — H35033 Hypertensive retinopathy, bilateral: Secondary | ICD-10-CM | POA: Diagnosis not present

## 2021-10-31 DIAGNOSIS — H34813 Central retinal vein occlusion, bilateral, with macular edema: Secondary | ICD-10-CM | POA: Diagnosis not present

## 2021-10-31 DIAGNOSIS — H25812 Combined forms of age-related cataract, left eye: Secondary | ICD-10-CM

## 2021-10-31 DIAGNOSIS — Z961 Presence of intraocular lens: Secondary | ICD-10-CM

## 2021-10-31 DIAGNOSIS — E113413 Type 2 diabetes mellitus with severe nonproliferative diabetic retinopathy with macular edema, bilateral: Secondary | ICD-10-CM

## 2021-10-31 DIAGNOSIS — I10 Essential (primary) hypertension: Secondary | ICD-10-CM

## 2021-10-31 DIAGNOSIS — H401111 Primary open-angle glaucoma, right eye, mild stage: Secondary | ICD-10-CM

## 2021-10-31 MED ORDER — BEVACIZUMAB CHEMO INJECTION 1.25MG/0.05ML SYRINGE FOR KALEIDOSCOPE
1.2500 mg | INTRAVITREAL | Status: AC | PRN
  Administered 2021-10-31: 1.25 mg via INTRAVITREAL

## 2021-11-28 NOTE — Progress Notes (Shared)
[facility] Note  12/05/2021     CHIEF COMPLAINT Patient presents for No chief complaint on file.    HISTORY OF PRESENT ILLNESS: Mary [name] is a 76 y.o. female who presents to the clinic today for:      Referring physician: Merlene Laughter, MD [phone] [place],  [place] [address]  HISTORICAL INFORMATION:   Selected notes from the MEDICAL RECORD NUMBER Transferred care from Dr. Zigmund Daniel   CURRENT MEDICATIONS: Current Outpatient Medications (Ophthalmic Drugs)  Medication Sig   brimonidine (ALPHAGAN) 0.2 % ophthalmic solution INSTILL 1 DROP INTO BOTH EYES TWICE A DAY   dorzolamide-timolol (COSOPT) 22.3-6.8 MG/ML ophthalmic solution Place 1 drop into the right eye 2 (two) times daily.   dorzolamide-timolol (COSOPT) 22.3-6.8 MG/ML ophthalmic solution Place 1 drop into both eyes 2 (two) times daily.   timolol (BETIMOL) 0.25 % ophthalmic solution Place 1 drop into the right eye 2 (two) times daily.   No current facility-administered medications for this visit. (Ophthalmic Drugs)   Current Outpatient Medications (Other)  Medication Sig   aspirin 81 MG chewable tablet 1 tablet   Assure Comfort Lancets 28G MISC    Blood Glucose Monitoring Suppl (ONETOUCH VERIO FLEX SYSTEM) w/Device KIT Use as directed to check blood sugars 2 times per day dx: e11.65   cephALEXin (KEFLEX) 500 MG capsule Take 1 capsule (500 mg total) by mouth 3 (three) times daily.   cetirizine (ZYRTEC) 10 MG tablet Take 1 tablet (10 mg total) by mouth daily.   fluticasone (FLONASE) 50 MCG/ACT nasal spray SPRAY 1 SPRAY INTO EACH NOSTRIL EVERY DAY   furosemide (LASIX) 40 MG tablet TAKE 1 TABLET BY MOUTH  DAILY   glucose blood (ONETOUCH VERIO) test strip Use as directed to check blood sugars 2 times per day dx: e11.65   HYDROcodone-acetaminophen (NORCO) 10-325 MG tablet Take 1 tablet by mouth 5 (five) times daily as needed.   levothyroxine (SYNTHROID) 125 MCG tablet Take  125 mcg by mouth daily before breakfast.   metoprolol succinate (TOPROL-XL) 50 MG 24 hr tablet TAKE 1 TABLET BY MOUTH  DAILY   omeprazole (PRILOSEC) 20 MG capsule TAKE 1 CAPSULE BY MOUTH  DAILY   OneTouch Delica Lancets 51Z MISC Use as directed to check blood sugars 2 times per day dx: e11.65   pravastatin (PRAVACHOL) 40 MG tablet TAKE 1 TABLET BY MOUTH EVERY DAY   telmisartan (MICARDIS) 80 MG tablet TAKE 1 TABLET BY MOUTH  DAILY   triamcinolone (KENALOG) 0.1 % APPLY TO AFFECTED AREA TWICE A DAY   No current facility-administered medications for this visit. (Other)   REVIEW OF SYSTEMS:   ALLERGIES No Known Allergies  PAST MEDICAL HISTORY Past Medical History:  Diagnosis Date   Arthritis    osteoarthritis   Cataract    OS   Diabetes mellitus    Insulin dependent   Diabetic retinopathy ([place])    NPDR OU   Glaucoma    POAG OD   H/O cardiovascular stress test    pt. reports 10/13- was normal per pt.    Hyperlipemia    Hypertension    Hypertensive retinopathy    OU   Past Surgical History:  Procedure Laterality Date   CARPAL TUNNEL RELEASE Bilateral    CATARACT EXTRACTION Right 10/05/2018   Dr. Kathlen Mody   EYE SURGERY Right    Cat Sx   KNEE ARTHROSCOPY     right   QUADRICEPS TENDON REPAIR  02/17/2012  Procedure: REPAIR QUADRICEP TENDON;  Surgeon: Vickey Huger, MD;  Location: [facility];  Service: Orthopedics;  Laterality: Right;  right quadriceps repair with latera release   QUADRICEPS TENDON REPAIR Right 05/15/2012   Procedure: REPAIR QUADRICEP TENDON;  Surgeon: Vickey Huger, MD;  Location: [facility];  Service: Orthopedics;  Laterality: Right;   TOTAL KNEE ARTHROPLASTY  10/21/2011   Procedure: TOTAL KNEE ARTHROPLASTY;  Surgeon: Rudean Haskell, MD;  Location: [facility];  Service: Orthopedics;  Laterality: Right;   TUBAL LIGATION     FAMILY HISTORY Family History  Problem Relation Age of Onset   Hypertension Mother    Heart Problems Father    Gout Father    Arthritis Father     SOCIAL HISTORY Social History   Tobacco Use   Smoking status: Former    Packs/day: 1.00    Years: 30.00    Total pack years: 30.00    Types: Cigarettes    Start date: 01/15/1967    Quit date: 06/28/2014    Years since quitting: 7.4   Smokeless tobacco: Never  Vaping Use   Vaping Use: Never used  Substance Use Topics   Alcohol use: No   Drug use: Yes    Types: Hydrocodone       OPHTHALMIC EXAM: Not recorded    IMAGING AND PROCEDURES  Imaging and Procedures for _0 @          ASSESSMENT/PLAN:  No diagnosis found.  1. CRVO w/ CME OU  - pt of Dr. Zigmund Daniel who wished to transfer care  - OS long-standing low vision (CF) with subfoveal scar and central retinal atrophy  - OD with CME actively being managed with Ozurdex OD ~q5 wks by Dr. Zigmund Daniel (last on 8.20.20) s/p multiple IVTA and Ozurdex OD  - was due for Ozurdex OD w/ Zigmund Daniel on 9.25.20, but underwent cataract surgery OD w/ Kathlen Mody on 9.21.20 - s/p IVTA OD #4 (10.26.20), #5 (12.10.20), #6 (01.21.21), #7 (03.05.21), #8 (04.16.21), #9 (05.28.21), #10 (07.09.21), #11 (09.14.21), #12 (10.29.21), #13 (12.17.21), #14 (01.27.22), #15 (03.11.2022), #16 (05.04.22), #17 (06.17.22), #18 (07.29.22), #19 (09.16.22), #20 (10.28.22), #21 (12.16.22), #22 (02.03.23) #23 (04.07.23), #24 (06.14.23)  - s/p IVA OD #1 (07.17.23), #2 (08.14.23), #3 (09.14.23), #4 (10.18.23)  - BCVA improved to 20/80  - exam shows mild improvement in blood stained vit condensations; IVTA cleared from vitreous  - OCT OD: Stable improvement in cystic changes temporal and superior macula, mild interval improvement in vitreous opacities; OS: Scattered IRF/cystic changes, persistent central ORA / SRHM at 5 weeks  - recommend IVA OD #5 today, 11.22.23 with follow up in 5-6 weeks  - pt wishes to proceed with injection  - RBA of procedure discussed, questions answered - informed consent obtained and signed - see procedure note  - IVA informed consent obtained  and signed, 07.17.23 (OD)  - IVTA informed consent obtained and re-signed on 06.14.23  - f/u 5-6 weeks -- DFE/OCT/possible injection  2. Severe Non-proliferative diabetic retinopathy OU.  - OCT shows diabetic macular edema, OD -- slightly increased  - s/p IVTA OD on 06.14.23 as above  - s/p IVA OD #1 on 07.17.23 as above for DME as well as CRVO  3,4. Hypertensive retinopathy OU  - discussed importance of tight BP control  - continue to monitor  5. 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 Pseudophakia OD  - s/p CE/IOL OD (9.21.20) by expert surgeon, Dr. Kathlen Mody  -  IOL in excellent position  - had post op IOP spike -- IOP now under better control  - continue to monitor  7. POAG OD  - now under the expert management of Dr. Edilia Bo (initial consult 07.27.23)  - s/p SLT and goniotomy OD w/ Dr. Kathlen Mody  - had post-cataract IOP spike  - on Brimonidine TID OU and Cosopt BID OU  - IOP 15 today  - AC taps post IVTA injections   Ophthalmic Meds Ordered this visit:  No orders of the defined types were placed in this encounter.    No follow-ups on file.  There are no Patient Instructions on file for this visit.   This document serves as a record of services personally performed by Gardiner Sleeper, MD, PhD. It was created on their behalf by Orvan Falconer, an ophthalmic technician. The creation of this record is the provider's dictation and/or activities during the visit.    Electronically signed by: Orvan Falconer, OA, 11/28/21  12:10 PM   Gardiner Sleeper, M.D., Ph.D. Diseases & Surgery of the Retina and Vitreous [facility]: M myopia (nearsighted); A astigmatism; H hyperopia (farsighted); P presbyopia; Mrx spectacle prescription;  CTL contact lenses; OD right eye; OS left eye; OU both eyes  XT exotropia; ET esotropia; PEK punctate epithelial keratitis; PEE punctate epithelial erosions; DES  dry eye syndrome; MGD meibomian gland dysfunction; ATs artificial tears; PFAT's preservative free artificial tears; [place] nuclear sclerotic cataract; PSC posterior subcapsular cataract; ERM epi-retinal membrane; PVD posterior vitreous detachment; RD retinal detachment; DM diabetes mellitus; DR diabetic retinopathy; NPDR non-proliferative diabetic retinopathy; PDR proliferative diabetic retinopathy; CSME clinically significant macular edema; DME diabetic macular edema; dbh dot blot hemorrhages; CWS cotton wool spot; POAG primary open angle glaucoma; C/D cup-to-disc ratio; HVF humphrey visual field; GVF goldmann visual field; OCT optical coherence tomography; IOP intraocular pressure; BRVO Branch retinal vein occlusion; CRVO central retinal vein occlusion; CRAO central retinal artery occlusion; BRAO branch retinal artery occlusion; RT retinal tear; SB scleral buckle; PPV pars plana vitrectomy; VH Vitreous hemorrhage; PRP panretinal laser photocoagulation; IVK intravitreal kenalog; VMT vitreomacular traction; MH Macular hole;  NVD neovascularization of the disc; NVE neovascularization elsewhere; AREDS age related eye disease study; ARMD age related macular degeneration; POAG primary open angle glaucoma; EBMD epithelial/anterior basement membrane dystrophy; ACIOL anterior chamber intraocular lens; IOL intraocular lens; PCIOL posterior chamber intraocular lens; Phaco/IOL phacoemulsification with intraocular lens placement; [place] photorefractive keratectomy; LASIK laser assisted in situ keratomileusis; HTN hypertension; DM diabetes mellitus; COPD chronic obstructive pulmonary disease

## 2021-12-05 ENCOUNTER — Encounter (INDEPENDENT_AMBULATORY_CARE_PROVIDER_SITE_OTHER): Payer: Medicare (Managed Care) | Admitting: Ophthalmology

## 2021-12-05 DIAGNOSIS — H25812 Combined forms of age-related cataract, left eye: Secondary | ICD-10-CM

## 2021-12-05 DIAGNOSIS — H401111 Primary open-angle glaucoma, right eye, mild stage: Secondary | ICD-10-CM

## 2021-12-05 DIAGNOSIS — I10 Essential (primary) hypertension: Secondary | ICD-10-CM

## 2021-12-05 DIAGNOSIS — E113413 Type 2 diabetes mellitus with severe nonproliferative diabetic retinopathy with macular edema, bilateral: Secondary | ICD-10-CM

## 2021-12-05 DIAGNOSIS — H35033 Hypertensive retinopathy, bilateral: Secondary | ICD-10-CM

## 2021-12-05 DIAGNOSIS — H34813 Central retinal vein occlusion, bilateral, with macular edema: Secondary | ICD-10-CM

## 2021-12-05 DIAGNOSIS — H4311 Vitreous hemorrhage, right eye: Secondary | ICD-10-CM

## 2021-12-05 DIAGNOSIS — Z961 Presence of intraocular lens: Secondary | ICD-10-CM

## 2021-12-13 NOTE — Progress Notes (Signed)
[facility] Note  12/14/2021     CHIEF COMPLAINT Patient presents for Retina Follow Up    HISTORY OF PRESENT ILLNESS: Mary [name] is a 76 y.o. female who presents to the clinic today for:   HPI     Retina Follow Up   Patient presents with  CRVO/BRVO.  In right eye.  This started 5 weeks ago.  Severity is moderate.  Duration of 5 weeks.  Since onset it is stable.  I, the attending physician,  performed the HPI with the patient and updated documentation appropriately.        Comments   6 week Retina follow up CRVO CME OD pt states her vision in OD has got dark she is having more trouble with seeing with daily activities pt has not checked her blood sugar in a few months since she is unable to see the number       Last edited by Bernarda Caffey, MD on 12/14/2021  4:35 PM.      Referring physician: Merlene Laughter, MD [address],  [place] [address]  HISTORICAL INFORMATION:   Selected notes from the MEDICAL RECORD NUMBER Transferred care from Dr. Zigmund Daniel   CURRENT MEDICATIONS: Current Outpatient Medications (Ophthalmic Drugs)  Medication Sig   brimonidine (ALPHAGAN) 0.2 % ophthalmic solution INSTILL 1 DROP INTO BOTH EYES TWICE A DAY   dorzolamide-timolol (COSOPT) 22.3-6.8 MG/ML ophthalmic solution Place 1 drop into the right eye 2 (two) times daily.   dorzolamide-timolol (COSOPT) 22.3-6.8 MG/ML ophthalmic solution Place 1 drop into both eyes 2 (two) times daily.   timolol (BETIMOL) 0.25 % ophthalmic solution Place 1 drop into the right eye 2 (two) times daily.   No current facility-administered medications for this visit. (Ophthalmic Drugs)   Current Outpatient Medications (Other)  Medication Sig   aspirin 81 MG chewable tablet 1 tablet   Assure Comfort Lancets 28G MISC    Blood Glucose Monitoring Suppl (ONETOUCH VERIO FLEX SYSTEM) w/Device KIT Use as directed to check blood sugars 2 times per day dx: e11.65   cephALEXin  (KEFLEX) 500 MG capsule Take 1 capsule (500 mg total) by mouth 3 (three) times daily.   cetirizine (ZYRTEC) 10 MG tablet Take 1 tablet (10 mg total) by mouth daily.   fluticasone (FLONASE) 50 MCG/ACT nasal spray SPRAY 1 SPRAY INTO EACH NOSTRIL EVERY DAY   furosemide (LASIX) 40 MG tablet TAKE 1 TABLET BY MOUTH  DAILY   glucose blood (ONETOUCH VERIO) test strip Use as directed to check blood sugars 2 times per day dx: e11.65   HYDROcodone-acetaminophen (NORCO) 10-325 MG tablet Take 1 tablet by mouth 5 (five) times daily as needed.   levothyroxine (SYNTHROID) 125 MCG tablet Take 125 mcg by mouth daily before breakfast.   metoprolol succinate (TOPROL-XL) 50 MG 24 hr tablet TAKE 1 TABLET BY MOUTH  DAILY   omeprazole (PRILOSEC) 20 MG capsule TAKE 1 CAPSULE BY MOUTH  DAILY   OneTouch Delica Lancets 11N MISC Use as directed to check blood sugars 2 times per day dx: e11.65   pravastatin (PRAVACHOL) 40 MG tablet TAKE 1 TABLET BY MOUTH EVERY DAY   telmisartan (MICARDIS) 80 MG tablet TAKE 1 TABLET BY MOUTH  DAILY   triamcinolone (KENALOG) 0.1 % APPLY TO AFFECTED AREA TWICE A DAY   No current facility-administered medications for this visit. (Other)   REVIEW OF SYSTEMS:   ALLERGIES No Known Allergies  PAST MEDICAL HISTORY Past Medical History:  Diagnosis Date   Arthritis    osteoarthritis   Cataract    OS   Diabetes mellitus    Insulin dependent   Diabetic retinopathy ([place])    NPDR OU   Glaucoma    POAG OD   H/O cardiovascular stress test    pt. reports 10/13- was normal per pt.    Hyperlipemia    Hypertension    Hypertensive retinopathy    OU   Past Surgical History:  Procedure Laterality Date   CARPAL TUNNEL RELEASE Bilateral    CATARACT EXTRACTION Right 10/05/2018   Dr. Kathlen Mody   EYE SURGERY Right    Cat Sx   KNEE ARTHROSCOPY     right   QUADRICEPS TENDON REPAIR  02/17/2012   Procedure: REPAIR QUADRICEP TENDON;  Surgeon: Vickey Huger, MD;  Location: [facility];  Service:  Orthopedics;  Laterality: Right;  right quadriceps repair with latera release   QUADRICEPS TENDON REPAIR Right 05/15/2012   Procedure: REPAIR QUADRICEP TENDON;  Surgeon: Vickey Huger, MD;  Location: [facility];  Service: Orthopedics;  Laterality: Right;   TOTAL KNEE ARTHROPLASTY  10/21/2011   Procedure: TOTAL KNEE ARTHROPLASTY;  Surgeon: Rudean Haskell, MD;  Location: [facility];  Service: Orthopedics;  Laterality: Right;   TUBAL LIGATION     FAMILY HISTORY Family History  Problem Relation Age of Onset   Hypertension Mother    Heart Problems Father    Gout Father    Arthritis Father    SOCIAL HISTORY Social History   Tobacco Use   Smoking status: Former    Packs/day: 1.00    Years: 30.00    Total pack years: 30.00    Types: Cigarettes    Start date: 01/15/1967    Quit date: 06/28/2014    Years since quitting: 7.4   Smokeless tobacco: Never  Vaping Use   Vaping Use: Never used  Substance Use Topics   Alcohol use: No   Drug use: Yes    Types: Hydrocodone       OPHTHALMIC EXAM: Base Eye Exam     Visual Acuity (Snellen - Linear)       Right Left   Dist [place] 20/150 HM   Dist ph [place] 20/100 -2 NI         Tonometry (Tonopen, 10:14 AM)       Right Left   Pressure 14 14         Pupils       Pupils Dark Light Shape React APD   Right PERRL 2 1 Round Sluggish None   Left PERRL 2 1 Round Sluggish None         Visual Fields       Left Right     Full   Restrictions Total superior temporal, inferior temporal, superior nasal, inferior nasal deficiencies          Extraocular Movement       Right Left    Full, Ortho Full, Ortho         Neuro/Psych     Oriented x3: Yes   Mood/Affect: Normal         Dilation     Both eyes: 2.5% Phenylephrine @ 10:14 AM           Slit Lamp and Fundus Exam     Slit Lamp Exam       Right Left   Lids/Lashes Dermatochalasis - upper lid, mild Meibomian gland dysfunction Dermatochalasis - upper lid, mild Meibomian gland  dysfunction  Conjunctiva/Sclera Melanosis Melanosis   Cornea Trace, Punctate epithelial erosions, well healed temporal cataract wounds, arcus, trace EBMD Mild arcus, trace PEE   Anterior Chamber Deep and quiet Deep and quiet   Iris Round and poorly dilated to 3.100m Round and moderately dilated to 681m  Lens PC IOL in good position, trace, cortical remnant temporally, PC folds 3+ Nuclear sclerosis with brunescense, 3+ Cortical cataract   Anterior Vitreous Vitreous syneresis, Ozurdex pellet remnants inferiorly, mild blood stained vitreous condensations -- now white Vitreous syneresis         Fundus Exam       Right Left   Disc 2-3+Pallor, Sharp rim, temporal PPP/PPA, thin superior rim, +cupping, vascular loops, +heme nasally - improved, large disc heme at 0300 Mild Pallor, Sharp rim, PPP, severely attenuated vessels   C/D Ratio 0.7 0.3   Macula Blunted foveal reflex, Drusen, Retinal pigment epithelial mottling, temporal cystic changes with exudates and MA -- slightly increased, punctate exudates - improved, persistent IRH greatest inferior macula Flat, Blunted foveal reflex, RPE mottling and clumping, scarring, atrophy, no heme   Vessels Severe attenuation, tortuosity Severe Vascular attenuation, +fibrosis   Periphery Attached, 360 DBH -- slightly improved, greatest posteriorly Attached, pavingstone and reticular degeneration inferiorly, scattered RPE atrophy, greatest peripapillary           Refraction     Wearing Rx       Sphere Cylinder Axis Add   Right -1.00 +0.75 180 +2.25   Left -0.50 +0.75 155 +2.25           IMAGING AND PROCEDURES  Imaging and Procedures for _0 @  OCT, Retina - OU - Both Eyes       Right Eye Quality was good. Central Foveal Thickness: 187. Progression has worsened. Findings include normal foveal contour, no SRF, intraretinal hyper-reflective material, intraretinal fluid, vitreomacular adhesion (Mild interval increase in IRF temporal macula,  trace vitreous opacities).   Left Eye Quality was borderline. Central Foveal Thickness: 254. Progression has been stable. Findings include no SRF, abnormal foveal contour, subretinal hyper-reflective material, epiretinal membrane, intraretinal fluid, pigment epithelial detachment, outer retinal atrophy, preretinal fibrosis (Scattered IRF/cystic changes, persistent central ORA / SRHM).   Notes *Images captured and stored on drive  Diagnosis / Impression:  OD: Mild interval increase in IRF temporal macula, trace vitreous opacities OS: Scattered IRF/cystic changes, persistent central ORA / SRHM  Clinical management:  See below  Abbreviations: NFP - Normal foveal profile. CME - cystoid macular edema. PED - pigment epithelial detachment. IRF - intraretinal fluid. SRF - subretinal fluid. EZ - ellipsoid zone. ERM - epiretinal membrane. ORA - outer retinal atrophy. ORT - outer retinal tubulation. SRHM - subretinal hyper-reflective material      Intravitreal Injection, Pharmacologic Agent - OD - Right Eye       Time Out 12/14/2021. 11:18 AM. Confirmed correct patient, procedure, site, and patient consented.   Anesthesia Topical anesthesia was used. Anesthetic medications included Lidocaine 2%, Proparacaine 0.5%.   Procedure Preparation included 5% betadine to ocular surface, eyelid speculum. A supplied (32g) needle was used.   Injection: 1.25 mg Bevacizumab 1.2532m.05ml   Route: Intravitreal, Site: Right Eye   NDC: 50242-060-01, Lot: 3: [id number] date: 01/07/2022   Post-op Post injection exam found visual acuity of at least counting fingers, no retinal detachment, perfused optic nerve. The patient tolerated the procedure well. There were no complications. The patient received written and verbal post procedure care education.  ASSESSMENT/PLAN:    ICD-10-CM   1. Central retinal vein occlusion with macular edema of both eyes  H34.8130 OCT, Retina - OU - Both  Eyes    Intravitreal Injection, Pharmacologic Agent - OD - Right Eye    Bevacizumab (AVASTIN) SOLN 1.25 mg    2. Severe nonproliferative diabetic retinopathy of both eyes with macular edema associated with type 2 diabetes mellitus ([place])  D40.8144     3. Essential hypertension  I10     4. Hypertensive retinopathy of both eyes  H35.033     5. 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 Pseudophakia  Z96.1     7. Primary open angle glaucoma (POAG) of right eye, mild stage  H40.1111     8. Vitreous hemorrhage of right eye (HCC)  H43.11      1. CRVO w/ CME OU  - delayed f/u to 6 wks instead of 5 due to Thanksgiving holiday  - pt of Dr. Zigmund Daniel who wished to transfer care  - OS long-standing low vision (CF) with subfoveal scar and central retinal atrophy  - OD with CME actively being managed with Ozurdex OD ~q5 wks by Dr. Zigmund Daniel (last on 8.20.20) s/p multiple IVTA and Ozurdex OD  - was due for Ozurdex OD w/ Zigmund Daniel on 9.25.20, but underwent cataract surgery OD w/ Kathlen Mody on 9.21.20 - s/p IVTA OD #4 (10.26.20), #5 (12.10.20), #6 (01.21.21), #7 (03.05.21), #8 (04.16.21), #9 (05.28.21), #10 (07.09.21), #11 (09.14.21), #12 (10.29.21), #13 (12.17.21), #14 (01.27.22), #15 (03.11.2022), #16 (05.04.22), #17 (06.17.22), #18 (07.29.22), #19 (09.16.22), #20 (10.28.22), #21 (12.16.22), #22 (02.03.23) #23 (04.07.23), #24 (06.14.23)  - s/p IVA OD #1 (07.17.23), #2 (08.14.23), #3 (09.14.23), #4 (10.18.23)  - BCVA stable at 20/100  - exam shows mild improvement in blood stained vit condensations  - OCT OD: Mild interval increase in IRF temporal macula, trace vitreous opacities OS: Scattered IRF/cystic changes, persistent central ORA / SRHM at 6 weeks  - recommend IVA OD #5 today, 12.01.23 with follow up in 5 weeks  - pt wishes to proceed with injection  - RBA of procedure discussed, questions answered - informed consent obtained and signed - see procedure note  - IVA informed  consent obtained and signed, 07.17.23 (OD)  - IVTA informed consent obtained and re-signed on 06.14.23  - will check Eylea authorization for next visit   - f/u 5 weeks -- DFE/OCT/possible injection  2. Severe Non-proliferative diabetic retinopathy OU.  - OCT shows diabetic macular edema, OD -- slightly increased  - s/p IVTA OD on 06.14.23 as above  - s/p IVA OD #1 on 07.17.23 as above for DME as well as CRVO  3,4. Hypertensive retinopathy OU  - discussed importance of tight BP control  - continue to monitor  5. Mixed form age related cataract OS  - under the expert management of [facility]  6. Pseudophakia OD  - s/p CE/IOL OD (9.21.20) by expert surgeon, Dr. Kathlen Mody  - IOL in excellent position  - had post op IOP spike -- IOP now under better control  - continue to monitor  7. POAG OD  - now under the expert management of Dr. Edilia Bo (initial consult 07.27.23)  - s/p SLT and goniotomy OD w/ Dr. Kathlen Mody  - had post-cataract IOP spike  - on Brimonidine TID OU and Cosopt BID OU  - IOP 14 today  - AC taps post IVTA injections   Ophthalmic Meds Ordered this visit:  Meds ordered this encounter  Medications   Bevacizumab (AVASTIN) SOLN 1.25 mg     Return in about 5 weeks (around 01/18/2022) for f/u CRVO OU, DFE, OCT.  There are no Patient Instructions on file for this visit.   This document serves as a record of services personally performed by Gardiner Sleeper, MD, PhD. It was created on their behalf by San Jetty. Owens Shark, OA an ophthalmic technician. The creation of this record is the provider's dictation and/or activities during the visit.    Electronically signed by: San Jetty. Owens Shark, [place] 11.30.2023 4:37 PM  Gardiner Sleeper, M.D., Ph.D. Diseases & Surgery of the Retina and Vitreous [facility]  I have reviewed the above documentation for accuracy and completeness, and I agree with the above. Gardiner Sleeper, M.D., Ph.D. 12/14/21 4:37  PM  Abbreviations: M myopia (nearsighted); A astigmatism; H hyperopia (farsighted); P presbyopia; Mrx spectacle prescription;  CTL contact lenses; OD right eye; OS left eye; OU both eyes  XT exotropia; ET esotropia; PEK punctate epithelial keratitis; PEE punctate epithelial erosions; DES dry eye syndrome; MGD meibomian gland dysfunction; ATs artificial tears; PFAT's preservative free artificial tears; [place] nuclear sclerotic cataract; PSC posterior subcapsular cataract; ERM epi-retinal membrane; PVD posterior vitreous detachment; RD retinal detachment; DM diabetes mellitus; DR diabetic retinopathy; NPDR non-proliferative diabetic retinopathy; PDR proliferative diabetic retinopathy; CSME clinically significant macular edema; DME diabetic macular edema; dbh dot blot hemorrhages; CWS cotton wool spot; POAG primary open angle glaucoma; C/D cup-to-disc ratio; HVF humphrey visual field; GVF goldmann visual field; OCT optical coherence tomography; IOP intraocular pressure; BRVO Branch retinal vein occlusion; CRVO central retinal vein occlusion; CRAO central retinal artery occlusion; BRAO branch retinal artery occlusion; RT retinal tear; SB scleral buckle; PPV pars plana vitrectomy; VH Vitreous hemorrhage; PRP panretinal laser photocoagulation; IVK intravitreal kenalog; VMT vitreomacular traction; MH Macular hole;  NVD neovascularization of the disc; NVE neovascularization elsewhere; AREDS age related eye disease study; ARMD age related macular degeneration; POAG primary open angle glaucoma; EBMD epithelial/anterior basement membrane dystrophy; ACIOL anterior chamber intraocular lens; IOL intraocular lens; PCIOL posterior chamber intraocular lens; Phaco/IOL phacoemulsification with intraocular lens placement; [place] photorefractive keratectomy; LASIK laser assisted in situ keratomileusis; HTN hypertension; DM diabetes mellitus; COPD chronic obstructive pulmonary disease

## 2021-12-14 ENCOUNTER — Encounter (INDEPENDENT_AMBULATORY_CARE_PROVIDER_SITE_OTHER): Payer: Self-pay | Admitting: Ophthalmology

## 2021-12-14 ENCOUNTER — Ambulatory Visit (INDEPENDENT_AMBULATORY_CARE_PROVIDER_SITE_OTHER): Payer: Medicare (Managed Care) | Admitting: Ophthalmology

## 2021-12-14 DIAGNOSIS — H4311 Vitreous hemorrhage, right eye: Secondary | ICD-10-CM

## 2021-12-14 DIAGNOSIS — I10 Essential (primary) hypertension: Secondary | ICD-10-CM

## 2021-12-14 DIAGNOSIS — E113413 Type 2 diabetes mellitus with severe nonproliferative diabetic retinopathy with macular edema, bilateral: Secondary | ICD-10-CM

## 2021-12-14 DIAGNOSIS — H35033 Hypertensive retinopathy, bilateral: Secondary | ICD-10-CM

## 2021-12-14 DIAGNOSIS — Z961 Presence of intraocular lens: Secondary | ICD-10-CM

## 2021-12-14 DIAGNOSIS — H34813 Central retinal vein occlusion, bilateral, with macular edema: Secondary | ICD-10-CM | POA: Diagnosis not present

## 2021-12-14 DIAGNOSIS — H401111 Primary open-angle glaucoma, right eye, mild stage: Secondary | ICD-10-CM

## 2021-12-14 DIAGNOSIS — H25812 Combined forms of age-related cataract, left eye: Secondary | ICD-10-CM

## 2021-12-14 MED ORDER — BEVACIZUMAB CHEMO INJECTION 1.25MG/0.05ML SYRINGE FOR KALEIDOSCOPE
1.2500 mg | INTRAVITREAL | Status: AC | PRN
  Administered 2021-12-14: 1.25 mg via INTRAVITREAL

## 2021-12-19 ENCOUNTER — Other Ambulatory Visit: Payer: Medicare Other

## 2021-12-19 ENCOUNTER — Other Ambulatory Visit: Payer: Self-pay

## 2021-12-19 ENCOUNTER — Inpatient Hospital Stay (HOSPITAL_COMMUNITY)
Admission: EM | Admit: 2021-12-19 | Discharge: 2021-12-30 | DRG: 178 | Disposition: A | Discharge status: Skilled Nursing Facility | Payer: Medicare (Managed Care) | Attending: Internal Medicine | Admitting: Internal Medicine

## 2021-12-19 ENCOUNTER — Encounter (HOSPITAL_COMMUNITY): Payer: Self-pay

## 2021-12-19 ENCOUNTER — Emergency Department (HOSPITAL_COMMUNITY): Payer: Medicare (Managed Care)

## 2021-12-19 DIAGNOSIS — E113293 Type 2 diabetes mellitus with mild nonproliferative diabetic retinopathy without macular edema, bilateral: Secondary | ICD-10-CM | POA: Diagnosis present

## 2021-12-19 DIAGNOSIS — N179 Acute kidney failure, unspecified: Secondary | ICD-10-CM | POA: Insufficient documentation

## 2021-12-19 DIAGNOSIS — R531 Weakness: Principal | ICD-10-CM

## 2021-12-19 DIAGNOSIS — R17 Unspecified jaundice: Secondary | ICD-10-CM | POA: Insufficient documentation

## 2021-12-19 DIAGNOSIS — I10 Essential (primary) hypertension: Secondary | ICD-10-CM | POA: Diagnosis present

## 2021-12-19 DIAGNOSIS — Z7982 Long term (current) use of aspirin: Secondary | ICD-10-CM

## 2021-12-19 DIAGNOSIS — E039 Hypothyroidism, unspecified: Secondary | ICD-10-CM | POA: Diagnosis present

## 2021-12-19 DIAGNOSIS — Z96651 Presence of right artificial knee joint: Secondary | ICD-10-CM | POA: Diagnosis present

## 2021-12-19 DIAGNOSIS — D649 Anemia, unspecified: Secondary | ICD-10-CM | POA: Diagnosis present

## 2021-12-19 DIAGNOSIS — Z8261 Family history of arthritis: Secondary | ICD-10-CM

## 2021-12-19 DIAGNOSIS — Z8249 Family history of ischemic heart disease and other diseases of the circulatory system: Secondary | ICD-10-CM

## 2021-12-19 DIAGNOSIS — R197 Diarrhea, unspecified: Secondary | ICD-10-CM | POA: Diagnosis present

## 2021-12-19 DIAGNOSIS — E860 Dehydration: Secondary | ICD-10-CM | POA: Diagnosis present

## 2021-12-19 DIAGNOSIS — R7989 Other specified abnormal findings of blood chemistry: Secondary | ICD-10-CM | POA: Insufficient documentation

## 2021-12-19 DIAGNOSIS — Z993 Dependence on wheelchair: Secondary | ICD-10-CM

## 2021-12-19 DIAGNOSIS — M1990 Unspecified osteoarthritis, unspecified site: Secondary | ICD-10-CM | POA: Diagnosis present

## 2021-12-19 DIAGNOSIS — Z794 Long term (current) use of insulin: Secondary | ICD-10-CM

## 2021-12-19 DIAGNOSIS — J069 Acute upper respiratory infection, unspecified: Secondary | ICD-10-CM | POA: Diagnosis present

## 2021-12-19 DIAGNOSIS — E785 Hyperlipidemia, unspecified: Secondary | ICD-10-CM | POA: Diagnosis present

## 2021-12-19 DIAGNOSIS — H35039 Hypertensive retinopathy, unspecified eye: Secondary | ICD-10-CM | POA: Diagnosis present

## 2021-12-19 DIAGNOSIS — Z9851 Tubal ligation status: Secondary | ICD-10-CM

## 2021-12-19 DIAGNOSIS — Z79899 Other long term (current) drug therapy: Secondary | ICD-10-CM

## 2021-12-19 DIAGNOSIS — Z751 Person awaiting admission to adequate facility elsewhere: Secondary | ICD-10-CM

## 2021-12-19 DIAGNOSIS — Z87891 Personal history of nicotine dependence: Secondary | ICD-10-CM

## 2021-12-19 DIAGNOSIS — I890 Lymphedema, not elsewhere classified: Secondary | ICD-10-CM | POA: Diagnosis present

## 2021-12-19 DIAGNOSIS — I2489 Other forms of acute ischemic heart disease: Secondary | ICD-10-CM | POA: Diagnosis present

## 2021-12-19 DIAGNOSIS — K219 Gastro-esophageal reflux disease without esophagitis: Secondary | ICD-10-CM | POA: Diagnosis present

## 2021-12-19 DIAGNOSIS — Z79890 Hormone replacement therapy (postmenopausal): Secondary | ICD-10-CM

## 2021-12-19 DIAGNOSIS — E119 Type 2 diabetes mellitus without complications: Secondary | ICD-10-CM

## 2021-12-19 DIAGNOSIS — U071 COVID-19: Secondary | ICD-10-CM | POA: Diagnosis not present

## 2021-12-19 DIAGNOSIS — H401110 Primary open-angle glaucoma, right eye, stage unspecified: Secondary | ICD-10-CM | POA: Diagnosis present

## 2021-12-19 DIAGNOSIS — M6282 Rhabdomyolysis: Secondary | ICD-10-CM | POA: Diagnosis present

## 2021-12-19 DIAGNOSIS — Z6841 Body Mass Index (BMI) 40.0 and over, adult: Secondary | ICD-10-CM

## 2021-12-19 DIAGNOSIS — R748 Abnormal levels of other serum enzymes: Secondary | ICD-10-CM | POA: Insufficient documentation

## 2021-12-19 LAB — COMPREHENSIVE METABOLIC PANEL
ALT: 12 U/L (ref 0–44)
AST: 30 U/L (ref 15–41)
Albumin: 3.5 g/dL (ref 3.5–5.0)
Alkaline Phosphatase: 83 U/L (ref 38–126)
Anion gap: 10 (ref 5–15)
BUN: 24 mg/dL — ABNORMAL HIGH (ref 8–23)
CO2: 22 mmol/L (ref 22–32)
Calcium: 8.8 mg/dL — ABNORMAL LOW (ref 8.9–10.3)
Chloride: 105 mmol/L (ref 98–111)
Creatinine, Ser: 1.37 mg/dL — ABNORMAL HIGH (ref 0.44–1.00)
GFR, Estimated: 41 mL/min — ABNORMAL LOW (ref >60)
Glucose, Bld: 120 mg/dL — ABNORMAL HIGH (ref 70–99)
Potassium: 4.5 mmol/L (ref 3.5–5.1)
Sodium: 137 mmol/L (ref 135–145)
Total Bilirubin: 1.8 mg/dL — ABNORMAL HIGH (ref 0.3–1.2)
Total Protein: 8 g/dL (ref 6.5–8.1)

## 2021-12-19 LAB — CBC WITH DIFFERENTIAL/PLATELET
Abs Immature Granulocytes: 0.01 10*3/uL (ref 0.00–0.07)
Basophils Absolute: 0 10*3/uL (ref 0.0–0.1)
Basophils Relative: 0 %
Eosinophils Absolute: 0 10*3/uL (ref 0.0–0.5)
Eosinophils Relative: 0 %
HCT: 29.3 % — ABNORMAL LOW (ref 36.0–46.0)
Hemoglobin: 9.3 g/dL — ABNORMAL LOW (ref 12.0–15.0)
Immature Granulocytes: 0 %
Lymphocytes Relative: 14 %
Lymphs Abs: 1.3 10*3/uL (ref 0.7–4.0)
MCH: 29.9 pg (ref 26.0–34.0)
MCHC: 31.7 g/dL (ref 30.0–36.0)
MCV: 94.2 fL (ref 80.0–100.0)
Monocytes Absolute: 1 10*3/uL (ref 0.1–1.0)
Monocytes Relative: 11 %
Neutro Abs: 6.8 10*3/uL (ref 1.7–7.7)
Neutrophils Relative %: 75 %
Platelets: 213 10*3/uL (ref 150–400)
RBC: 3.11 MIL/uL — ABNORMAL LOW (ref 3.87–5.11)
RDW: 13.2 % (ref 11.5–15.5)
WBC: 9.2 10*3/uL (ref 4.0–10.5)
nRBC: 0 % (ref 0.0–0.2)

## 2021-12-19 LAB — RESP PANEL BY RT-PCR (FLU A&B, COVID) ARPGX2
Influenza A by PCR: NEGATIVE
Influenza B by PCR: NEGATIVE
SARS Coronavirus 2 by RT PCR: POSITIVE — AB

## 2021-12-19 LAB — CBG MONITORING, ED: Glucose-Capillary: 111 mg/dL — ABNORMAL HIGH (ref 70–99)

## 2021-12-19 LAB — TROPONIN I (HIGH SENSITIVITY): Troponin I (High Sensitivity): 382 ng/L (ref <18)

## 2021-12-19 MED ORDER — ASPIRIN 81 MG PO CHEW
324.0000 mg | CHEWABLE_TABLET | Freq: Once | ORAL | Status: AC
  Administered 2021-12-20: 324 mg via ORAL
  Filled 2021-12-19: qty 4

## 2021-12-19 MED ORDER — SODIUM CHLORIDE 0.9 % IV BOLUS
1000.0000 mL | Freq: Once | INTRAVENOUS | Status: AC
  Administered 2021-12-19: 1000 mL via INTRAVENOUS

## 2021-12-19 NOTE — ED Triage Notes (Signed)
BIBA from home for diarrhea, abd pain, flu-like sxs for a week, EMS has been for last 3 days twice a day for lift assist, unable to stand, weakness  142/74 80 98% CBG 147

## 2021-12-19 NOTE — ED Provider Notes (Signed)
[facility] DEPT Provider Note   CSN: [id number] Arrival date & time: 12/19/21  2133     History {Add pertinent medical, surgical, social history, OB history to HPI:1} Chief Complaint  Patient presents with  . Weakness    Mary [name] is a 76 y.o. female.  Patient presents to the ED with a 1 week history of generalized weakness, chills, diarrhea and abdominal pain.  States diarrhea is improving but was happening up to 2 or 3 times daily that was nonbloody.  No vomiting.  No known fever.  Poor appetite and not wanting to eat or drink much.  EMS has been called several times in the past few days to help with transfer assistance.  Patient lives alone and has difficulty getting around due to her lymphedema at baseline.  Denies any travel or sick contacts.  No recent antibiotic use.  No pain with urination or blood in the urine.  Does have some congestion, sore throat and cough as well.  Symptoms seem to get worse after she received a flu shot 5 days ago.  Her diarrhea has started to improve however.  Still feels generally weak and having some abdominal cramping with weakness and poor appetite.  Denies any chest pain or shortness of breath.  Denies any antibiotic use, travel or sick contacts.  The history is provided by the patient and the EMS personnel.  Weakness Associated symptoms: arthralgias, diarrhea and myalgias   Associated symptoms: no abdominal pain, no chest pain, no cough, no dizziness, no dysuria, no fever, no nausea, no shortness of breath and no vomiting        Home Medications Prior to Admission medications   Medication Sig Start Date End Date Taking? Authorizing Provider  aspirin 81 MG chewable tablet 1 tablet    [provider]  Assure Comfort Lancets 28G [place]  05/17/14   [provider]  Blood Glucose Monitoring Suppl ([place]) w/Device KIT Use as directed to check blood sugars 2 times per day dx:  e11.65 10/19/19   Glendale Chard, MD  brimonidine (ALPHAGAN) 0.2 % ophthalmic solution INSTILL 1 DROP INTO BOTH EYES TWICE A DAY 07/09/21   Bernarda Caffey, MD  cephALEXin (KEFLEX) 500 MG capsule Take 1 capsule (500 mg total) by mouth 3 (three) times daily. 08/23/21   Angelia Mould, MD  cetirizine (ZYRTEC) 10 MG tablet Take 1 tablet (10 mg total) by mouth daily. 02/12/21   Glendale Chard, MD  dorzolamide-timolol (COSOPT) 22.3-6.8 MG/ML ophthalmic solution Place 1 drop into the right eye 2 (two) times daily. 09/28/21   Bernarda Caffey, MD  dorzolamide-timolol (COSOPT) 22.3-6.8 MG/ML ophthalmic solution Place 1 drop into both eyes 2 (two) times daily. 10/05/21 10/05/22  Bernarda Caffey, MD  fluticasone [facility]) 50 MCG/ACT nasal spray SPRAY 1 SPRAY INTO EACH NOSTRIL EVERY DAY 02/12/21   Glendale Chard, MD  furosemide (LASIX) 40 MG tablet TAKE 1 TABLET BY MOUTH  DAILY 06/05/20   Glendale Chard, MD  glucose blood (ONETOUCH VERIO) test strip Use as directed to check blood sugars 2 times per day dx: e11.65 07/12/20   Glendale Chard, MD  HYDROcodone-acetaminophen [facility]) 10-325 MG tablet Take 1 tablet by mouth 5 (five) times daily as needed. 11/27/18   [provider]  levothyroxine (SYNTHROID) 125 MCG tablet Take 125 mcg by mouth daily before breakfast.    [provider]  metoprolol succinate (TOPROL-XL) 50 MG 24 hr tablet TAKE 1 TABLET BY MOUTH  DAILY 05/08/21  Glendale Chard, MD  omeprazole (PRILOSEC) 20 MG capsule TAKE 1 CAPSULE BY MOUTH  DAILY 11/01/19   Glendale Chard, MD  OneTouch Delica Lancets 02I MISC Use as directed to check blood sugars 2 times per day dx: e11.65 10/19/19   Glendale Chard, MD  pravastatin (PRAVACHOL) 40 MG tablet TAKE 1 TABLET BY MOUTH EVERY DAY 08/11/21   Glendale Chard, MD  telmisartan (MICARDIS) 80 MG tablet TAKE 1 TABLET BY MOUTH  DAILY 09/11/20   Glendale Chard, MD  timolol (BETIMOL) 0.25 % ophthalmic solution Place 1 drop into the right eye 2 (two) times daily.     [provider]  triamcinolone (KENALOG) 0.1 % APPLY TO AFFECTED AREA TWICE A DAY 12/06/19   Glendale Chard, MD      Allergies    Patient has no known allergies.    Review of Systems   Review of Systems  Constitutional:  Positive for activity change, appetite change and fatigue. Negative for fever.  HENT:  Negative for congestion and rhinorrhea.   Respiratory:  Negative for cough, chest tightness and shortness of breath.   Cardiovascular:  Positive for leg swelling. Negative for chest pain.  Gastrointestinal:  Positive for diarrhea. Negative for abdominal pain, nausea and vomiting.  Genitourinary:  Negative for dysuria and hematuria.  Musculoskeletal:  Positive for arthralgias and myalgias.  Skin:  Negative for rash.  Neurological:  Positive for weakness and light-headedness. Negative for dizziness.   all other systems are negative except as noted in the HPI and PMH.    Physical Exam Updated Vital Signs There were no vitals taken for this visit. Physical Exam Vitals and nursing note reviewed.  Constitutional:      General: She is not in acute distress.    Appearance: She is well-developed. She is not ill-appearing.  HENT:     Head: Normocephalic and atraumatic.     Mouth/Throat:     Mouth: Mucous membranes are dry.     Pharynx: No oropharyngeal exudate.  Eyes:     Conjunctiva/sclera: Conjunctivae normal.     Pupils: Pupils are equal, round, and reactive to light.  Neck:     Comments: No meningismus. Cardiovascular:     Rate and Rhythm: Normal rate and regular rhythm.     Heart sounds: Normal heart sounds. No murmur heard. Pulmonary:     Effort: Pulmonary effort is normal. No respiratory distress.     Breath sounds: Normal breath sounds.  Abdominal:     Palpations: Abdomen is soft.     Tenderness: There is no abdominal tenderness. There is no guarding or rebound.  Musculoskeletal:        General: No tenderness. Normal range of motion.     Cervical back:  Normal range of motion and neck supple.     Right lower leg: Edema present.     Left lower leg: Edema present.     Comments: Lymphedema to thighs bilaterally. Intact DP pulses with Doppler  Skin:    General: Skin is warm.  Neurological:     Mental Status: She is alert and oriented to person, place, and time.     Cranial Nerves: No cranial nerve deficit.     Motor: No abnormal muscle tone.     Coordination: Coordination normal.     Comments:  5/5 strength throughout. CN 2-12 intact.Equal grip strength.   Psychiatric:        Behavior: Behavior normal.    ED Results / Procedures / Treatments   Labs (all  labs ordered are listed, but only abnormal results are displayed) Labs Reviewed  CBG MONITORING, ED - Abnormal; Notable for the following components:      Result Value   Glucose-Capillary 111 (*)    All other components within normal limits  RESP PANEL BY RT-PCR (FLU A&B, COVID) [id number]  CBC WITH DIFFERENTIAL/PLATELET  COMPREHENSIVE METABOLIC PANEL  URINALYSIS, ROUTINE W REFLEX MICROSCOPIC  TROPONIN I (HIGH SENSITIVITY)    EKG None  Radiology No results found.  Procedures Procedures  {Document cardiac monitor, telemetry assessment procedure when appropriate:1}  Medications Ordered in ED Medications - No data to display  ED Course/ Medical Decision Making/ A&P                           Medical Decision Making Amount and/or Complexity of Data Reviewed Labs: ordered. Decision-making details documented in ED Course. Radiology: ordered and independent interpretation performed. Decision-making details documented in ED Course. ECG/medicine tests: ordered and independent interpretation performed. Decision-making details documented in ED Course.  Generalized weakness for the past week with diarrhea, chills and abdominal pain.  Vitals are stable, no distress, abdomen soft without peritoneal signs  {Document critical care time when appropriate:1} {Document review of labs and  clinical decision tools ie heart score, Chads2Vasc2 etc:1}  {Document your independent review of radiology images, and any outside records:1} {Document your discussion with family members, caretakers, and with consultants:1} {Document social determinants of health affecting pt's care:1} {Document your decision making why or why not admission, treatments were needed:1} Final Clinical Impression(s) / ED Diagnoses Final diagnoses:  None    Rx / DC Orders ED Discharge Orders     None

## 2021-12-20 ENCOUNTER — Observation Stay (HOSPITAL_COMMUNITY): Payer: Medicare (Managed Care)

## 2021-12-20 DIAGNOSIS — I10 Essential (primary) hypertension: Secondary | ICD-10-CM | POA: Diagnosis not present

## 2021-12-20 DIAGNOSIS — R17 Unspecified jaundice: Secondary | ICD-10-CM | POA: Insufficient documentation

## 2021-12-20 DIAGNOSIS — U071 COVID-19: Secondary | ICD-10-CM | POA: Diagnosis not present

## 2021-12-20 DIAGNOSIS — R7889 Finding of other specified substances, not normally found in blood: Secondary | ICD-10-CM

## 2021-12-20 DIAGNOSIS — R7989 Other specified abnormal findings of blood chemistry: Secondary | ICD-10-CM | POA: Insufficient documentation

## 2021-12-20 DIAGNOSIS — R748 Abnormal levels of other serum enzymes: Secondary | ICD-10-CM | POA: Insufficient documentation

## 2021-12-20 DIAGNOSIS — N179 Acute kidney failure, unspecified: Secondary | ICD-10-CM | POA: Insufficient documentation

## 2021-12-20 LAB — ECHOCARDIOGRAM COMPLETE
Area-P 1/2: 4.06 cm2
Calc EF: 55.9 %
S' Lateral: 3.2 cm
Single Plane A2C EF: 56.3 %
Single Plane A4C EF: 56.7 %

## 2021-12-20 LAB — URINALYSIS, ROUTINE W REFLEX MICROSCOPIC
Bilirubin Urine: NEGATIVE
Glucose, UA: NEGATIVE mg/dL
Hgb urine dipstick: NEGATIVE
Ketones, ur: NEGATIVE mg/dL
Leukocytes,Ua: NEGATIVE
Nitrite: NEGATIVE
Protein, ur: 30 mg/dL — AB
Specific Gravity, Urine: 1.015 (ref 1.005–1.030)
pH: 6 (ref 5.0–8.0)

## 2021-12-20 LAB — CBC
HCT: 24.8 % — ABNORMAL LOW (ref 36.0–46.0)
Hemoglobin: 7.9 g/dL — ABNORMAL LOW (ref 12.0–15.0)
MCH: 30.4 pg (ref 26.0–34.0)
MCHC: 31.9 g/dL (ref 30.0–36.0)
MCV: 95.4 fL (ref 80.0–100.0)
Platelets: 173 10*3/uL (ref 150–400)
RBC: 2.6 MIL/uL — ABNORMAL LOW (ref 3.87–5.11)
RDW: 13.4 % (ref 11.5–15.5)
WBC: 6.5 10*3/uL (ref 4.0–10.5)
nRBC: 0 % (ref 0.0–0.2)

## 2021-12-20 LAB — BILIRUBIN, FRACTIONATED(TOT/DIR/INDIR)
Bilirubin, Direct: 0.4 mg/dL — ABNORMAL HIGH (ref 0.0–0.2)
Indirect Bilirubin: 0.8 mg/dL (ref 0.3–0.9)
Total Bilirubin: 1.2 mg/dL (ref 0.3–1.2)

## 2021-12-20 LAB — CK
Total CK: 551 U/L — ABNORMAL HIGH (ref 38–234)
Total CK: 489 U/L — ABNORMAL HIGH (ref 38–234)

## 2021-12-20 LAB — D-DIMER, QUANTITATIVE: D-Dimer, Quant: 1.53 ug/mL-FEU — ABNORMAL HIGH (ref 0.00–0.50)

## 2021-12-20 LAB — TROPONIN I (HIGH SENSITIVITY): Troponin I (High Sensitivity): 318 ng/L (ref <18)

## 2021-12-20 LAB — CBG MONITORING, ED
Glucose-Capillary: 78 mg/dL (ref 70–99)
Glucose-Capillary: 121 mg/dL — ABNORMAL HIGH (ref 70–99)
Glucose-Capillary: 69 mg/dL — ABNORMAL LOW (ref 70–99)
Glucose-Capillary: 88 mg/dL (ref 70–99)

## 2021-12-20 LAB — C-REACTIVE PROTEIN: CRP: 12.5 mg/dL — ABNORMAL HIGH (ref <1.0)

## 2021-12-20 LAB — GLUCOSE, CAPILLARY: Glucose-Capillary: 75 mg/dL (ref 70–99)

## 2021-12-20 LAB — LACTATE DEHYDROGENASE: LDH: 226 U/L — ABNORMAL HIGH (ref 98–192)

## 2021-12-20 LAB — FIBRINOGEN: Fibrinogen: 444 mg/dL (ref 210–475)

## 2021-12-20 LAB — FERRITIN: Ferritin: 194 ng/mL (ref 11–307)

## 2021-12-20 MED ORDER — SODIUM CHLORIDE 0.9 % IV SOLN: INTRAVENOUS | Status: AC

## 2021-12-20 MED ORDER — METOPROLOL TARTRATE 5 MG/5ML IV SOLN
5.0000 mg | INTRAVENOUS | Status: DC | PRN
  Administered 2021-12-20: 5 mg via INTRAVENOUS
  Filled 2021-12-20: qty 5

## 2021-12-20 MED ORDER — INSULIN ASPART 100 UNIT/ML IJ SOLN
0.0000 [IU] | Freq: Three times a day (TID) | INTRAMUSCULAR | Status: DC
  Administered 2021-12-24: 1 [IU] via SUBCUTANEOUS
  Administered 2021-12-26: 2 [IU] via SUBCUTANEOUS
  Administered 2021-12-27 – 2021-12-28 (×2): 1 [IU] via SUBCUTANEOUS
  Filled 2021-12-20: qty 0.09

## 2021-12-20 MED ORDER — HYDROCODONE-ACETAMINOPHEN 5-325 MG PO TABS
2.0000 | ORAL_TABLET | Freq: Once | ORAL | Status: AC
  Administered 2021-12-20: 2 via ORAL
  Filled 2021-12-20: qty 2

## 2021-12-20 MED ORDER — ASPIRIN 81 MG PO CHEW
81.0000 mg | CHEWABLE_TABLET | Freq: Every day | ORAL | Status: DC
  Administered 2021-12-20 – 2021-12-30 (×11): 81 mg via ORAL
  Filled 2021-12-20 (×11): qty 1

## 2021-12-20 MED ORDER — MONTELUKAST SODIUM 10 MG PO TABS
10.0000 mg | ORAL_TABLET | Freq: Every day | ORAL | Status: DC
  Administered 2021-12-20 – 2021-12-29 (×10): 10 mg via ORAL
  Filled 2021-12-20 (×10): qty 1

## 2021-12-20 MED ORDER — LEVOTHYROXINE SODIUM 125 MCG PO TABS
125.0000 ug | ORAL_TABLET | Freq: Every day | ORAL | Status: DC
  Administered 2021-12-20 – 2021-12-30 (×11): 125 ug via ORAL
  Filled 2021-12-20 (×11): qty 1

## 2021-12-20 MED ORDER — OXYCODONE HCL 5 MG PO TABS
5.0000 mg | ORAL_TABLET | ORAL | Status: DC | PRN
  Administered 2021-12-20 – 2021-12-30 (×36): 5 mg via ORAL
  Filled 2021-12-20 (×37): qty 1

## 2021-12-20 MED ORDER — IPRATROPIUM-ALBUTEROL 20-100 MCG/ACT IN AERS
1.0000 | INHALATION_SPRAY | Freq: Four times a day (QID) | RESPIRATORY_TRACT | Status: DC
  Administered 2021-12-20 (×3): 1 via RESPIRATORY_TRACT
  Filled 2021-12-20: qty 4

## 2021-12-20 MED ORDER — DEXTROSE-NACL 5-0.9 % IV SOLN: INTRAVENOUS | Status: DC

## 2021-12-20 MED ORDER — SALINE SPRAY 0.65 % NA SOLN
1.0000 | NASAL | Status: DC | PRN
  Administered 2021-12-21: 1 via NASAL
  Filled 2021-12-20: qty 44

## 2021-12-20 MED ORDER — ONDANSETRON HCL 4 MG/2ML IJ SOLN
4.0000 mg | Freq: Four times a day (QID) | INTRAMUSCULAR | Status: DC | PRN
  Filled 2021-12-20: qty 2

## 2021-12-20 MED ORDER — GUAIFENESIN 100 MG/5ML PO LIQD
5.0000 mL | ORAL | Status: DC | PRN
  Administered 2021-12-24 – 2021-12-30 (×15): 5 mL via ORAL
  Filled 2021-12-20 (×15): qty 10

## 2021-12-20 MED ORDER — IBUPROFEN 200 MG PO TABS
400.0000 mg | ORAL_TABLET | Freq: Once | ORAL | Status: AC
  Administered 2021-12-20: 400 mg via ORAL
  Filled 2021-12-20: qty 2

## 2021-12-20 MED ORDER — IPRATROPIUM-ALBUTEROL 20-100 MCG/ACT IN AERS
1.0000 | INHALATION_SPRAY | Freq: Two times a day (BID) | RESPIRATORY_TRACT | Status: DC
  Administered 2021-12-21 – 2021-12-30 (×18): 1 via RESPIRATORY_TRACT

## 2021-12-20 MED ORDER — HYDRALAZINE HCL 20 MG/ML IJ SOLN
10.0000 mg | INTRAMUSCULAR | Status: DC | PRN
  Administered 2021-12-20 – 2021-12-22 (×3): 10 mg via INTRAVENOUS
  Filled 2021-12-20 (×3): qty 1

## 2021-12-20 MED ORDER — MOLNUPIRAVIR EUA 200MG CAPSULE: 4.0000 | ORAL_CAPSULE | Freq: Two times a day (BID) | ORAL | Status: DC

## 2021-12-20 MED ORDER — PANTOPRAZOLE SODIUM 40 MG PO TBEC
40.0000 mg | DELAYED_RELEASE_TABLET | Freq: Every day | ORAL | Status: DC
  Administered 2021-12-20 – 2021-12-30 (×11): 40 mg via ORAL
  Filled 2021-12-20 (×11): qty 1

## 2021-12-20 MED ORDER — METOPROLOL SUCCINATE ER 50 MG PO TB24
50.0000 mg | ORAL_TABLET | Freq: Every day | ORAL | Status: DC
  Administered 2021-12-20 – 2021-12-30 (×11): 50 mg via ORAL
  Filled 2021-12-20 (×11): qty 1

## 2021-12-20 MED ORDER — FLUTICASONE PROPIONATE 50 MCG/ACT NA SUSP
1.0000 | Freq: Every day | NASAL | Status: DC
  Administered 2021-12-21 – 2021-12-30 (×11): 1 via NASAL
  Filled 2021-12-20: qty 16

## 2021-12-20 MED ORDER — INSULIN ASPART 100 UNIT/ML IJ SOLN
0.0000 [IU] | Freq: Every day | INTRAMUSCULAR | Status: DC
  Filled 2021-12-20: qty 0.05

## 2021-12-20 MED ORDER — ACETAMINOPHEN 500 MG PO TABS
500.0000 mg | ORAL_TABLET | Freq: Once | ORAL | Status: AC
  Administered 2021-12-20: 500 mg via ORAL
  Filled 2021-12-20: qty 1

## 2021-12-20 MED ORDER — ACETAMINOPHEN 325 MG PO TABS
650.0000 mg | ORAL_TABLET | ORAL | Status: DC | PRN
  Administered 2021-12-20 – 2021-12-26 (×3): 650 mg via ORAL
  Filled 2021-12-20 (×3): qty 2

## 2021-12-20 MED ORDER — TIMOLOL MALEATE 0.25 % OP SOLN
1.0000 [drp] | Freq: Two times a day (BID) | OPHTHALMIC | Status: DC
  Administered 2021-12-21 – 2021-12-30 (×20): 1 [drp] via OPHTHALMIC
  Filled 2021-12-20 (×2): qty 5

## 2021-12-20 MED ORDER — TRAZODONE HCL 50 MG PO TABS
50.0000 mg | ORAL_TABLET | Freq: Every evening | ORAL | Status: DC | PRN
  Administered 2021-12-20 – 2021-12-29 (×10): 50 mg via ORAL
  Filled 2021-12-20 (×10): qty 1

## 2021-12-20 MED ORDER — ENOXAPARIN SODIUM 40 MG/0.4ML IJ SOSY
40.0000 mg | PREFILLED_SYRINGE | INTRAMUSCULAR | Status: DC
  Administered 2021-12-20 – 2021-12-30 (×11): 40 mg via SUBCUTANEOUS
  Filled 2021-12-20 (×11): qty 0.4

## 2021-12-20 MED ORDER — NIRMATRELVIR/RITONAVIR (PAXLOVID) TABLET (RENAL DOSING)
2.0000 | ORAL_TABLET | Freq: Two times a day (BID) | ORAL | Status: AC
  Administered 2021-12-20 – 2021-12-24 (×10): 2 via ORAL
  Filled 2021-12-20: qty 20

## 2021-12-20 MED ORDER — ACETAMINOPHEN 325 MG PO TABS
650.0000 mg | ORAL_TABLET | Freq: Four times a day (QID) | ORAL | Status: DC | PRN
  Administered 2021-12-20: 650 mg via ORAL
  Filled 2021-12-20: qty 2

## 2021-12-20 MED ORDER — NIRMATRELVIR/RITONAVIR (PAXLOVID)TABLET: 3.0000 | ORAL_TABLET | Freq: Two times a day (BID) | ORAL | Status: DC

## 2021-12-20 NOTE — Progress Notes (Signed)
Patient admitted this morning by Dr Loney Loh.   76 y.o. female with medical history significant of type 2 diabetes, hypertension, diabetic and hypertensive retinopathy, glaucoma, central retinal vein occlusion, hyperlipidemia, hypothyroidism, GERD, substance abuse presented to the ED with complaints of diarrhea, abdominal pain, URI symptoms, and generalized weakness.  Patient was diagnosed with COVID-19 with mild URI and GI symptoms.  Also elevated troponin likely demand ischemia.  Seen and examined at bedside.  Tells me she has some pain and discomfort especially in her legs from chronic lymphedema.  Denies any fevers and chills.  No diarrhea since morning.  Vital signs are overall stable Does have mild congestion bilaterally, abdomen is nontender nondistended, bilateral lower extremity chronic lymphedema   COVID-19 viral infection -Active COVID-19 infection with mild GI and URI symptoms.  Chest x-ray is negative.  5 days of molnupiravir without any indication of steroids.  Continue airborne precautions and supplemental oxygen, supportive care   Elevated troponin Troponin elevated but stable (382> 318) and not consistent with ACS.  Patient is without chest pain.  Continue cardiac monitoring.  Echocardiogram.   Diarrhea Likely due to COVID-19 viral infection.  GI panel and C. difficile ordered   Elevated CK, rhabdomyolysis, improving Statin on hold getting IV fluids   Type 2 diabetes Currently on sliding scale and Accu-Chek.  Awaiting pharmacy to confirm home meds   Chronic normocytic anemia Hemoglobin down to 7.9 this morning.  Upon admission she was 9.3.  Also diluted therefore we will continue to monitor this    Mild AKI Baseline creatinine 1.0.  Admission creatinine 1.3.  Getting IV fluids.   Elevated T. Bili T. bili 1.8 but remainder of LFTs normal. -Fractionated bilirubin levels   Hyperlipidemia -Hold statin given elevated CK   Hypertension Hypothyroidism GERD Bilateral  lower extremity chronic lymphedema-has been following outpatient lymphedema clinic.  She stopped using her lymphedema pump couple of weeks ago   Awaiting pharmacy to complete med rec.  They have been alerted  Lisa Roca, her daughter. No answer  Stephania Fragmin MD [facility]

## 2021-12-20 NOTE — ED Notes (Signed)
Hospitalist bedside 

## 2021-12-20 NOTE — H&P (Signed)
History and Physical    Mary [name]:[id number] DOB: [birth year] DOA: 12/19/2021  PCP: Merlene Laughter, MD  Patient coming from: Home  Chief Complaint: Generalized weakness  HPI: Mary [name] is a 76 y.o. female with medical history significant of type 2 diabetes, hypertension, diabetic and hypertensive retinopathy, glaucoma, central retinal vein occlusion, hyperlipidemia, hypothyroidism, GERD, substance abuse presented to the ED with complaints of diarrhea, abdominal pain, URI symptoms, and generalized weakness.  Afebrile.  Labs showing no leukocytosis, hemoglobin 9.3 (baseline 10-11 range), BUN 24, creatinine 1.3 (baseline 1.0-1.2), T. bili 1.8 but remainder of LFTs normal, UA pending, high sensitive troponin 382> 318, SARS-CoV-2 PCR positive, influenza panel negative, CK 551.  Chest x-ray showing cardiomegaly but no active disease. Patient was given aspirin, Norco, and 1 L normal saline bolus.  TRH called to admit.  Patient states she started having diarrhea a week ago.  She received her flu shot 5 days ago and then 3 days ago started experiencing symptoms including runny nose, cough, body aches, chills, and fatigue.  Denies shortness of breath or chest pain.  Denies nausea, vomiting, or abdominal pain.  She stays alone and denies any recent sick contacts.  She has received 2 initial doses of the COVID-vaccine but no booster.  She reports chronic bilateral lower extremity edema secondary to lymphedema.  No other complaints.  Review of Systems:  Review of Systems  All other systems reviewed and are negative.   Past Medical History:  Diagnosis Date   Arthritis    osteoarthritis   Cataract    OS   Diabetes mellitus    Insulin dependent   Diabetic retinopathy (HCC)    NPDR OU   Glaucoma    POAG OD   H/O cardiovascular stress test    pt. reports 10/13- was normal per pt.    Hyperlipemia    Hypertension    Hypertensive retinopathy    OU    Past Surgical History:   Procedure Laterality Date   CARPAL TUNNEL RELEASE Bilateral    CATARACT EXTRACTION Right 10/05/2018   Dr. Kathlen Mody   EYE SURGERY Right    Cat Sx   KNEE ARTHROSCOPY     right   QUADRICEPS TENDON REPAIR  02/17/2012   Procedure: REPAIR QUADRICEP TENDON;  Surgeon: Vickey Huger, MD;  Location: [place];  Service: Orthopedics;  Laterality: Right;  right quadriceps repair with latera release   QUADRICEPS TENDON REPAIR Right 05/15/2012   Procedure: REPAIR QUADRICEP TENDON;  Surgeon: Vickey Huger, MD;  Location: [place];  Service: Orthopedics;  Laterality: Right;   TOTAL KNEE ARTHROPLASTY  10/21/2011   Procedure: TOTAL KNEE ARTHROPLASTY;  Surgeon: Rudean Haskell, MD;  Location: [place];  Service: Orthopedics;  Laterality: Right;   TUBAL LIGATION       reports that she quit smoking about 7 years ago. Her smoking use included cigarettes. She started smoking about 54 years ago. She has a 30.00 pack-year smoking history. She has never used smokeless tobacco. She reports current drug use. Drug: Hydrocodone. She reports that she does not drink alcohol.  No Known Allergies  Family History  Problem Relation Age of Onset   Hypertension Mother    Heart Problems Father    Gout Father    Arthritis Father     Prior to Admission medications   Medication Sig Start Date End Date Taking? Authorizing Provider  aspirin 81 MG chewable tablet 1 tablet    [provider]  Assure Comfort Lancets 28G  [place]  05/17/14   [provider]  Blood Glucose Monitoring Suppl ([place]) w/Device KIT Use as directed to check blood sugars 2 times per day dx: e11.65 10/19/19   Glendale Chard, MD  brimonidine (ALPHAGAN) 0.2 % ophthalmic solution INSTILL 1 DROP INTO BOTH EYES TWICE A DAY 07/09/21   Bernarda Caffey, MD  cephALEXin (KEFLEX) 500 MG capsule Take 1 capsule (500 mg total) by mouth 3 (three) times daily. 08/23/21   Angelia Mould, MD  cetirizine (ZYRTEC) 10 MG tablet Take 1 tablet (10 mg  total) by mouth daily. 02/12/21   Glendale Chard, MD  dorzolamide-timolol (COSOPT) 22.3-6.8 MG/ML ophthalmic solution Place 1 drop into the right eye 2 (two) times daily. 09/28/21   Bernarda Caffey, MD  dorzolamide-timolol (COSOPT) 22.3-6.8 MG/ML ophthalmic solution Place 1 drop into both eyes 2 (two) times daily. 10/05/21 10/05/22  Bernarda Caffey, MD  fluticasone [facility]) 50 MCG/ACT nasal spray SPRAY 1 SPRAY INTO EACH NOSTRIL EVERY DAY 02/12/21   Glendale Chard, MD  furosemide (LASIX) 40 MG tablet TAKE 1 TABLET BY MOUTH  DAILY 06/05/20   Glendale Chard, MD  glucose blood (ONETOUCH VERIO) test strip Use as directed to check blood sugars 2 times per day dx: e11.65 07/12/20   Glendale Chard, MD  HYDROcodone-acetaminophen [facility]) 10-325 MG tablet Take 1 tablet by mouth 5 (five) times daily as needed. 11/27/18   [provider]  levothyroxine (SYNTHROID) 125 MCG tablet Take 125 mcg by mouth daily before breakfast.    [provider]  metoprolol succinate (TOPROL-XL) 50 MG 24 hr tablet TAKE 1 TABLET BY MOUTH  DAILY 05/08/21   Glendale Chard, MD  omeprazole (PRILOSEC) 20 MG capsule TAKE 1 CAPSULE BY MOUTH  DAILY 11/01/19   Glendale Chard, MD  OneTouch Delica Lancets 74B MISC Use as directed to check blood sugars 2 times per day dx: e11.65 10/19/19   Glendale Chard, MD  pravastatin (PRAVACHOL) 40 MG tablet TAKE 1 TABLET BY MOUTH EVERY DAY 08/11/21   Glendale Chard, MD  telmisartan (MICARDIS) 80 MG tablet TAKE 1 TABLET BY MOUTH  DAILY 09/11/20   Glendale Chard, MD  timolol (BETIMOL) 0.25 % ophthalmic solution Place 1 drop into the right eye 2 (two) times daily.    [provider]  triamcinolone (KENALOG) 0.1 % APPLY TO AFFECTED AREA TWICE A DAY 12/06/19   Glendale Chard, MD    Physical Exam: Vitals:   12/20/21 0030 12/20/21 0100 12/20/21 0130 12/20/21 0200  BP: 122/85 (!) 151/70 (!) 165/100 (!) 176/86  Pulse: 79 74 76 74  Resp: _0 (!) 29  Temp:      TempSrc:      SpO2: 98% 100%  98% 95%    Physical Exam Vitals reviewed.  Constitutional:      General: She is not in acute distress. HENT:     Head: Normocephalic and atraumatic.  Eyes:     Extraocular Movements: Extraocular movements intact.  Cardiovascular:     Rate and Rhythm: Normal rate and regular rhythm.     Pulses: Normal pulses.  Pulmonary:     Effort: Pulmonary effort is normal. No respiratory distress.     Breath sounds: Normal breath sounds. No wheezing or rales.  Abdominal:     General: Bowel sounds are normal. There is no distension.     Palpations: Abdomen is soft.     Tenderness: There is no abdominal tenderness.  Musculoskeletal:     Cervical back: Normal range of motion.  Right lower leg: Edema present.     Left lower leg: Edema present.     Comments: Bilateral stage IV lymphedema  Skin:    General: Skin is warm and dry.  Neurological:     General: No focal deficit present.     Mental Status: She is alert and oriented to person, place, and time.     Labs on Admission: I have personally reviewed following labs and imaging studies  CBC: Recent Labs  Lab 12/19/21 2215  WBC 9.2  NEUTROABS 6.8  HGB 9.3*  HCT 29.3*  MCV 94.2  PLT 053   Basic Metabolic Panel: Recent Labs  Lab 12/19/21 2215  NA 137  K 4.5  CL 105  CO2 22  GLUCOSE 120*  BUN 24*  CREATININE 1.37*  CALCIUM 8.8*   GFR: CrCl cannot be calculated (Unknown ideal weight.). Liver Function Tests: Recent Labs  Lab 12/19/21 2215  AST 30  ALT 12  ALKPHOS 83  BILITOT 1.8*  PROT 8.0  ALBUMIN 3.5   No results for input(s): "LIPASE", "AMYLASE" in the last 168 hours. No results for input(s): "AMMONIA" in the last 168 hours. Coagulation Profile: No results for input(s): "INR", "PROTIME" in the last 168 hours. Cardiac Enzymes: Recent Labs  Lab 12/20/21 0020  CKTOTAL 551*   BNP (last 3 results) No results for input(s): "PROBNP" in the last 8760 hours. HbA1C: No results for input(s): "HGBA1C" in the  last 72 hours. CBG: Recent Labs  Lab 12/19/21 2158  GLUCAP 111*   Lipid Profile: No results for input(s): "CHOL", "HDL", "LDLCALC", "TRIG", "CHOLHDL", "LDLDIRECT" in the last 72 hours. Thyroid Function Tests: No results for input(s): "TSH", "T4TOTAL", "FREET4", "T3FREE", "THYROIDAB" in the last 72 hours. Anemia Panel: No results for input(s): "VITAMINB12", "FOLATE", "FERRITIN", "TIBC", "IRON", "RETICCTPCT" in the last 72 hours. Urine analysis:    Component Value Date/Time   COLORURINE YELLOW 07/22/2020 2013   APPEARANCEUR CLEAR 07/22/2020 2013   LABSPEC 1.011 07/22/2020 2013   PHURINE 7.0 07/22/2020 2013   GLUCOSEU NEGATIVE 07/22/2020 2013   HGBUR SMALL (A) 07/22/2020 2013   BILIRUBINUR NEGATIVE 07/22/2020 2013   BILIRUBINUR Negative 11/18/2017 [place] 07/22/2020 2013   PROTEINUR >=300 (A) 07/22/2020 2013   UROBILINOGEN 1.0 11/18/2017 1540   UROBILINOGEN 2.0 (H) 08/05/2012 1601   NITRITE NEGATIVE 07/22/2020 2013   LEUKOCYTESUR NEGATIVE 07/22/2020 2013    Radiological Exams on Admission: DG Chest Portable 1 View  Result Date: 12/19/2021 CLINICAL DATA:  Weakness, diarrhea, abdominal pain, flu-like symptoms for week. EXAM: PORTABLE CHEST 1 VIEW COMPARISON:  08/08/2019. FINDINGS: The heart is enlarged and the mediastinal contour is stable. No consolidation, effusion, or pneumothorax. Degenerative changes are present in the thoracic spine. No acute osseous abnormality. IMPRESSION: Cardiomegaly with no active disease. Electronically Signed   By: Brett Fairy M.D.   On: 12/19/2021 22:39    EKG: Independently reviewed.  Sinus rhythm with first-degree AV block, no significant change since prior tracing.  Assessment and Plan  COVID-19 viral infection Patient is presenting with GI and URI symptoms.  SARS-CoV-2 PCR positive.  No fever or leukocytosis.  Chest x-ray not suggestive of pneumonia.  Not hypoxic. -Start 5-day course of molnupiravir -No indication for  steroids at this time -Check inflammatory markers including ferritin, fibrinogen, D-dimer, CRP, LDH -Airborne and contact precautions -Continuous pulse ox -Supplemental oxygen as needed to keep oxygen saturation above 90%  Elevated troponin Troponin elevated but stable (382> 318) and not consistent with ACS.  Patient  is not endorsing chest pain. ?Viral myocarditis. -Cardiac monitoring -Echocardiogram  Diarrhea Likely due to COVID-19 viral infection.  No fever or leukocytosis.  Abdominal exam benign. -C. difficile PCR and GI pathogen panel, enteric precautions  Elevated CK Likely due to acute viral infection. -IV fluid hydration and continue to trend -Hold statin  Type 2 diabetes Last A1c in the chart was 5.2 in November 2022. -Repeat A1c -Sensitive sliding scale insulin ACHS -Pharmacy med rec pending  Chronic normocytic anemia Hemoglobin 9.3, baseline in the 10-11 range.  No signs of active bleeding. -Continue to monitor  Mild AKI Likely due to dehydration.  BUN 24, creatinine 1.3 (baseline 1.0-1.2). -IV fluid hydration -Monitor renal function -Avoid nephrotoxic agents/hold home Lasix and telmisartan  Elevated T. Bili T. bili 1.8 but remainder of LFTs normal. -Fractionated bilirubin levels  Hyperlipidemia -Hold statin given elevated CK  Hypertension Hypothyroidism GERD -Pharmacy med rec pending.  DVT prophylaxis: Lovenox Code Status: Full Code (discussed with the patient) Family Communication: No family available at this time. Level of care: Telemetry bed Admission status: It is my clinical opinion that referral for OBSERVATION is reasonable and necessary in this patient based on the above information provided. The aforementioned taken together are felt to place the patient at high risk for further clinical deterioration. However, it is anticipated that the patient may be medically stable for discharge from the hospital within 24 to 48 hours.   Shela Leff  MD [facility]  If 7PM-7AM, please contact night-coverage [email or web address]  12/20/2021, 3:14 AM

## 2021-12-20 NOTE — Progress Notes (Signed)
  Echocardiogram 2D Echocardiogram has been performed.  Augustine Radar 12/20/2021, 8:51 AM

## 2021-12-20 NOTE — ED Provider Notes (Signed)
Patient signed out to me by Dr. Consuella Lose.  Patient seen with generalized weakness, URI symptoms, diarrhea.  Patient feeling very weak.  She is not hypoxic.  Patient is positive for COVID which likely explains her constellation of symptoms.  Her only complaint currently is generalized myalgias that she normally takes hydrocodone for.  Not experiencing any active chest pain or shortness of breath.  Vitals are unremarkable.  A second troponin has been obtained.  First troponin was 382, second has gone down to 318.  Will require hospitalization for further evaluation, needs work-up for COVID related elevated troponins.   Gilda Crease, MD 12/20/21 [phone]

## 2021-12-20 NOTE — ED Notes (Signed)
Admitting MD into room, at [facility]

## 2021-12-20 NOTE — Progress Notes (Signed)
   12/20/21 2024  Assess: MEWS Score  Temp (!) 102.1 F (38.9 C)  BP (!) 203/85  MAP (mmHg) 116  Pulse Rate (!) 103  Resp 20  SpO2 100 %  Assess: MEWS Score  MEWS Temp 2  MEWS Systolic 2  MEWS Pulse 1  MEWS RR 0  MEWS LOC 0  MEWS Score 5  MEWS Score Color Red  Assess: if the MEWS score is Yellow or Red  Were vital signs taken at a resting state? Yes  Focused Assessment Change from prior assessment (see assessment flowsheet)  Does the patient meet 2 or more of the SIRS criteria? Yes  Does the patient have a confirmed or suspected source of infection? Yes  Provider and Rapid Response Notified? Yes  MEWS guidelines implemented *See Row Information* Yes  Treat  MEWS Interventions Administered prn meds/treatments  Pain Scale 0-10  Pain Score 4  Pain Location Generalized  Pain Intervention(s) Rest  Take Vital Signs  Increase Vital Sign Frequency  Red: Q 1hr X 4 then Q 4hr X 4, if remains red, continue Q 4hrs  Escalate  MEWS: Escalate Red: discuss with charge nurse/RN and provider, consider discussing with RRT  Notify: Charge Nurse/RN  Name of Charge Nurse/RN Notified T Fountain Derusha  Date Charge Nurse/RN Notified 12/20/21  Time Charge Nurse/RN Notified 2030  Provider Notification  Provider Name/Title Virgel Manifold  Date Provider Notified 12/20/21  Time Provider Notified 2030  Method of Notification Page  Notification Reason Change in status  Provider response See new orders  Date of Provider Response 12/20/21  Time of Provider Response 2030  Notify: Rapid Response  Name of Rapid Response RN Notified Mandy  Date Rapid Response Notified 12/20/21  Time Rapid Response Notified 2030  Document  Patient Outcome Stabilized after interventions  Progress note created (see row info) Yes  Assess: SIRS CRITERIA  SIRS Temperature  1  SIRS Pulse 1  SIRS Respirations  0  SIRS WBC 1  SIRS Score Sum  3

## 2021-12-20 NOTE — Significant Event (Signed)
Rapid Response Event Note   Reason for Call :  Fever, significant hypertension, RED MEWS alert  Initial Focused Assessment:  Patient just admitted to [facility] with BP of 203/85, HR 103, Temp 102.1 all triggering RED MEWS alert. Patient is alert, oriented x 4, shivering due to fever and complaining of generalized pain. Patient is on room air with oxygen saturations greater than 97% and denies shortness of breath. Lung sounds diminished, irregular heart rhythm, but on continuous oxygen monitoring and telemetry. Bedside staff providing bath and peri-care for incontinence and applying new external urinary device.  Interventions:  Focused assessment and chart review.  Bedside RN gave Metoprolol 5mg  and Tylenol 650mg  per orders (see MAR). Room temp was in the 80's and I turned it to 65 explaining that room must be cool and keeping blankets off to help lower fever. Rechecked blood pressure thirty minutes after Metoprolol given. Will utilize Hydralazine as needed according to orders.  Plan of Care:  Follow MEWS protocol for rechecking vital signs. Alert on call provider and rapid response nurse for any changes in condition.   Event Summary:   MD Notified: A. , NP Call Time: 2025 Arrival Time: 2030 End Time: 2115  2031, RN

## 2021-12-21 DIAGNOSIS — I2489 Other forms of acute ischemic heart disease: Secondary | ICD-10-CM | POA: Diagnosis present

## 2021-12-21 DIAGNOSIS — I890 Lymphedema, not elsewhere classified: Secondary | ICD-10-CM | POA: Diagnosis present

## 2021-12-21 DIAGNOSIS — E785 Hyperlipidemia, unspecified: Secondary | ICD-10-CM | POA: Diagnosis present

## 2021-12-21 DIAGNOSIS — Z794 Long term (current) use of insulin: Secondary | ICD-10-CM | POA: Diagnosis not present

## 2021-12-21 DIAGNOSIS — H35039 Hypertensive retinopathy, unspecified eye: Secondary | ICD-10-CM | POA: Diagnosis present

## 2021-12-21 DIAGNOSIS — I10 Essential (primary) hypertension: Secondary | ICD-10-CM | POA: Diagnosis present

## 2021-12-21 DIAGNOSIS — R748 Abnormal levels of other serum enzymes: Secondary | ICD-10-CM | POA: Diagnosis not present

## 2021-12-21 DIAGNOSIS — Z79899 Other long term (current) drug therapy: Secondary | ICD-10-CM | POA: Diagnosis not present

## 2021-12-21 DIAGNOSIS — Z993 Dependence on wheelchair: Secondary | ICD-10-CM | POA: Diagnosis not present

## 2021-12-21 DIAGNOSIS — H401110 Primary open-angle glaucoma, right eye, stage unspecified: Secondary | ICD-10-CM | POA: Diagnosis present

## 2021-12-21 DIAGNOSIS — K219 Gastro-esophageal reflux disease without esophagitis: Secondary | ICD-10-CM | POA: Diagnosis present

## 2021-12-21 DIAGNOSIS — M6282 Rhabdomyolysis: Secondary | ICD-10-CM | POA: Diagnosis present

## 2021-12-21 DIAGNOSIS — U071 COVID-19: Secondary | ICD-10-CM | POA: Diagnosis present

## 2021-12-21 DIAGNOSIS — Z8249 Family history of ischemic heart disease and other diseases of the circulatory system: Secondary | ICD-10-CM | POA: Diagnosis not present

## 2021-12-21 DIAGNOSIS — N179 Acute kidney failure, unspecified: Secondary | ICD-10-CM | POA: Diagnosis present

## 2021-12-21 DIAGNOSIS — Z6841 Body Mass Index (BMI) 40.0 and over, adult: Secondary | ICD-10-CM | POA: Diagnosis not present

## 2021-12-21 DIAGNOSIS — E039 Hypothyroidism, unspecified: Secondary | ICD-10-CM | POA: Diagnosis present

## 2021-12-21 DIAGNOSIS — R531 Weakness: Secondary | ICD-10-CM | POA: Diagnosis present

## 2021-12-21 DIAGNOSIS — E113293 Type 2 diabetes mellitus with mild nonproliferative diabetic retinopathy without macular edema, bilateral: Secondary | ICD-10-CM | POA: Diagnosis present

## 2021-12-21 DIAGNOSIS — E860 Dehydration: Secondary | ICD-10-CM | POA: Diagnosis present

## 2021-12-21 DIAGNOSIS — R7989 Other specified abnormal findings of blood chemistry: Secondary | ICD-10-CM | POA: Diagnosis not present

## 2021-12-21 DIAGNOSIS — Z751 Person awaiting admission to adequate facility elsewhere: Secondary | ICD-10-CM | POA: Diagnosis not present

## 2021-12-21 DIAGNOSIS — D649 Anemia, unspecified: Secondary | ICD-10-CM | POA: Diagnosis present

## 2021-12-21 DIAGNOSIS — J069 Acute upper respiratory infection, unspecified: Secondary | ICD-10-CM | POA: Diagnosis present

## 2021-12-21 LAB — COMPREHENSIVE METABOLIC PANEL
ALT: 12 U/L (ref 0–44)
AST: 23 U/L (ref 15–41)
Albumin: 2.9 g/dL — ABNORMAL LOW (ref 3.5–5.0)
Alkaline Phosphatase: 71 U/L (ref 38–126)
Anion gap: 10 (ref 5–15)
BUN: 17 mg/dL (ref 8–23)
CO2: 20 mmol/L — ABNORMAL LOW (ref 22–32)
Calcium: 8.4 mg/dL — ABNORMAL LOW (ref 8.9–10.3)
Chloride: 107 mmol/L (ref 98–111)
Creatinine, Ser: 1.16 mg/dL — ABNORMAL HIGH (ref 0.44–1.00)
GFR, Estimated: 49 mL/min — ABNORMAL LOW (ref >60)
Glucose, Bld: 91 mg/dL (ref 70–99)
Potassium: 3.5 mmol/L (ref 3.5–5.1)
Sodium: 137 mmol/L (ref 135–145)
Total Bilirubin: 1.6 mg/dL — ABNORMAL HIGH (ref 0.3–1.2)
Total Protein: 6.7 g/dL (ref 6.5–8.1)

## 2021-12-21 LAB — PROCALCITONIN: Procalcitonin: 0.41 ng/mL

## 2021-12-21 LAB — CBC WITH DIFFERENTIAL/PLATELET
Abs Immature Granulocytes: 0.09 10*3/uL — ABNORMAL HIGH (ref 0.00–0.07)
Basophils Absolute: 0 10*3/uL (ref 0.0–0.1)
Basophils Relative: 0 %
Eosinophils Absolute: 0 10*3/uL (ref 0.0–0.5)
Eosinophils Relative: 0 %
HCT: 27.6 % — ABNORMAL LOW (ref 36.0–46.0)
Hemoglobin: 8.8 g/dL — ABNORMAL LOW (ref 12.0–15.0)
Immature Granulocytes: 1 %
Lymphocytes Relative: 13 %
Lymphs Abs: 1 10*3/uL (ref 0.7–4.0)
MCH: 30.2 pg (ref 26.0–34.0)
MCHC: 31.9 g/dL (ref 30.0–36.0)
MCV: 94.8 fL (ref 80.0–100.0)
Monocytes Absolute: 0.6 10*3/uL (ref 0.1–1.0)
Monocytes Relative: 8 %
Neutro Abs: 5.9 10*3/uL (ref 1.7–7.7)
Neutrophils Relative %: 78 %
Platelets: 194 10*3/uL (ref 150–400)
RBC: 2.91 MIL/uL — ABNORMAL LOW (ref 3.87–5.11)
RDW: 13.3 % (ref 11.5–15.5)
WBC: 7.6 10*3/uL (ref 4.0–10.5)
nRBC: 0 % (ref 0.0–0.2)

## 2021-12-21 LAB — CBC
HCT: 25.5 % — ABNORMAL LOW (ref 36.0–46.0)
Hemoglobin: 8.1 g/dL — ABNORMAL LOW (ref 12.0–15.0)
MCH: 30.6 pg (ref 26.0–34.0)
MCHC: 31.8 g/dL (ref 30.0–36.0)
MCV: 96.2 fL (ref 80.0–100.0)
Platelets: 191 10*3/uL (ref 150–400)
RBC: 2.65 MIL/uL — ABNORMAL LOW (ref 3.87–5.11)
RDW: 13.7 % (ref 11.5–15.5)
WBC: 7.8 10*3/uL (ref 4.0–10.5)
nRBC: 0 % (ref 0.0–0.2)

## 2021-12-21 LAB — HEMOGLOBIN A1C
Hgb A1c MFr Bld: 5.3 % (ref 4.8–5.6)
Mean Plasma Glucose: 105 mg/dL

## 2021-12-21 LAB — BASIC METABOLIC PANEL
Anion gap: 8 (ref 5–15)
BUN: 16 mg/dL (ref 8–23)
CO2: 21 mmol/L — ABNORMAL LOW (ref 22–32)
Calcium: 8 mg/dL — ABNORMAL LOW (ref 8.9–10.3)
Chloride: 106 mmol/L (ref 98–111)
Creatinine, Ser: 1.14 mg/dL — ABNORMAL HIGH (ref 0.44–1.00)
GFR, Estimated: 51 mL/min — ABNORMAL LOW (ref >60)
Glucose, Bld: 109 mg/dL — ABNORMAL HIGH (ref 70–99)
Potassium: 3.2 mmol/L — ABNORMAL LOW (ref 3.5–5.1)
Sodium: 135 mmol/L (ref 135–145)

## 2021-12-21 LAB — GLUCOSE, CAPILLARY
Glucose-Capillary: 94 mg/dL (ref 70–99)
Glucose-Capillary: 91 mg/dL (ref 70–99)
Glucose-Capillary: 113 mg/dL — ABNORMAL HIGH (ref 70–99)
Glucose-Capillary: 94 mg/dL (ref 70–99)

## 2021-12-21 LAB — LACTIC ACID, PLASMA: Lactic Acid, Venous: 1.3 mmol/L (ref 0.5–1.9)

## 2021-12-21 LAB — MAGNESIUM: Magnesium: 1.6 mg/dL — ABNORMAL LOW (ref 1.7–2.4)

## 2021-12-21 MED ORDER — MAGNESIUM SULFATE 4 GM/100ML IV SOLN
4.0000 g | Freq: Once | INTRAVENOUS | Status: AC
  Administered 2021-12-21: 4 g via INTRAVENOUS
  Filled 2021-12-21: qty 100

## 2021-12-21 MED ORDER — POTASSIUM CHLORIDE 20 MEQ PO PACK
40.0000 meq | PACK | ORAL | Status: AC
  Administered 2021-12-21 (×2): 40 meq via ORAL
  Filled 2021-12-21 (×2): qty 2

## 2021-12-21 NOTE — Evaluation (Signed)
Occupational Therapy Evaluation Patient Details Name: Mary [name] MRN: [id number] DOB: April 16, 1945 Today's Date: 12/21/2021   History of Present Illness 76 y.o. female with medical history significant of type 2 diabetes, hypertension, diabetic and hypertensive retinopathy, glaucoma, central retinal vein occlusion, hyperlipidemia, hypothyroidism, GERD, substance abuse presented to the ED with complaints of diarrhea, abdominal pain, URI symptoms, and generalized weakness.  Patient was diagnosed with COVID-19 with mild URI and GI symptoms.  Also elevated troponin likely demand ischemia.   Clinical Impression   Mary [name] is a 76 year old woman who presents with morbid obesity. Lymphedema in lower extremities and generalized weakness resulting in a decline in functional abilities. Patient lives alone and is modified independent from wheelchair level and has an aide that assists with IADLs. Today patient exhibiting difficulty with moving lower extremities due to increased edema and inability to stand. She is needing increased assistance for ADLs at bed level. Patient will benefit from skilled OT services while in hospital to improve deficits and learn compensatory strategies as needed in order to return to PLOF.  Therapist encouraged patient to have lymphedema pump to hospital to initiate reducing edema in legs. Current recommendation is SNF for rehab.       Recommendations for follow up therapy are one component of a multi-disciplinary discharge planning process, led by the attending physician.  Recommendations may be updated based on patient status, additional functional criteria and insurance authorization.   Follow Up Recommendations  Skilled nursing-short term rehab (<3 hours/day)     Assistance Recommended at Discharge Intermittent Supervision/Assistance  Patient can return home with the following A lot of help with bathing/dressing/bathroom;A lot of help with walking and/or  transfers;Assistance with cooking/housework;Help with stairs or ramp for entrance;Assist for transportation    Functional Status Assessment  Patient has had a recent decline in their functional status and demonstrates the ability to make significant improvements in function in a reasonable and predictable amount of time.  Equipment Recommendations  None recommended by OT    Recommendations for Other Services       Precautions / Restrictions Precautions Precautions: Fall Precaution Comments: Low vision, BLE lymphedema Restrictions Weight Bearing Restrictions: No      Mobility Bed Mobility Overal bed mobility: Needs Assistance Bed Mobility: Supine to Sit, Sit to Supine     Supine to sit: HOB elevated, Min assist Sit to supine: Mod assist   General bed mobility comments: MIn assist to pull hips forward with bed bad. increased time and use of bed rails to transfer to edge of bed. Mod assist for LEs to return to supine. Patient able to assist with pulling herself up in bed.    Transfers                   General transfer comment: Unable to stand due to feet sliding      Balance Overall balance assessment: Needs assistance Sitting-balance support: No upper extremity supported, Feet supported Sitting balance-Leahy Scale: Good                                     ADL either performed or assessed with clinical judgement   ADL Overall ADL's : Needs assistance/impaired Eating/Feeding: Independent   Grooming: Set up;Sitting   Upper Body Bathing: Set up;Sitting   Lower Body Bathing: Maximal assistance;Sitting/lateral leans   Upper Body Dressing : Set up;Sitting   Lower Body Dressing: Total  assistance;Bed level     Toilet Transfer Details (indicate cue type and reason): unable Toileting- Clothing Manipulation and Hygiene: Total assistance;Bed level               Vision Ability to See in Adequate Light: 2 Moderately impaired Patient Visual  Report: No change from baseline Additional Comments: Blind in Left Eye, Poor vision in Right (can't make out faces)     Perception     Praxis      Pertinent Vitals/Pain Pain Assessment Pain Assessment: Faces Faces Pain Scale: Hurts little more Pain Location: Posterior RLE Pain Descriptors / Indicators: Grimacing Pain Intervention(s): Repositioned     Hand Dominance Right   Extremity/Trunk Assessment Upper Extremity Assessment Upper Extremity Assessment: Overall WFL for tasks assessed   Lower Extremity Assessment Lower Extremity Assessment: Defer to PT evaluation   Cervical / Trunk Assessment Cervical / Trunk Assessment:  (body habitus)   Communication Communication Communication: No difficulties   Cognition Arousal/Alertness: Awake/alert Behavior During Therapy: WFL for tasks assessed/performed Overall Cognitive Status: Within Functional Limits for tasks assessed                                       General Comments       Exercises     Shoulder Instructions      Home Living Family/patient expects to be discharged to:: Private residence Living Arrangements: Alone   Type of Home: House Home Access: Ramped entrance     Home Layout: One level     Bathroom Shower/Tub: Tub/shower unit         Home Equipment: Wheelchair - manual;Grab bars - toilet   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 haven't walked since mother's day. uses walker to transfer ADLs Comments: aide helps with IADLs, reports she gets dressed herself. Washes up on the toilet.        OT Problem List: Decreased activity tolerance;Cardiopulmonary status limiting activity;Obesity;Increased edema      OT Treatment/Interventions: Self-care/ADL training;Therapeutic exercise;DME and/or AE instruction;Therapeutic activities;Balance training;Patient/family  education    OT Goals(Current goals can be found in the care plan section) Acute Rehab OT Goals Patient Stated Goal: to be able to transfer and get a little stronger OT Goal Formulation: With patient Time For Goal Achievement: 01/04/22 Potential to Achieve Goals: Fair  OT Frequency: Min 2X/week    Co-evaluation              AM-PAC OT "6 Clicks" Daily Activity     Outcome Measure                 End of Session Nurse Communication: Mobility status  Activity Tolerance: Patient tolerated treatment well Patient left: in bed;with call bell/phone within reach  OT Visit Diagnosis: Muscle weakness (generalized) (M62.81)                Time: [id number] OT Time Calculation (min): 32 min Charges:  OT General Charges $OT Visit: 1 Visit OT Evaluation $OT Eval Low Complexity: 1 Low OT Treatments $Therapeutic Activity: 8-22 mins  Gustavo Lah, OTR/L [place]  Office [phone]   Lenward Chancellor 12/21/2021, 12:59 PM

## 2021-12-21 NOTE — Progress Notes (Signed)
PROGRESS NOTE    Mary [name]  ZOX:[id number] DOB: 1945-11-16 DOA: 12/19/2021 PCP: Angela Cox, MD   Brief Narrative:   76 y.o. female with medical history significant of type 2 diabetes, hypertension, diabetic and hypertensive retinopathy, glaucoma, central retinal vein occlusion, hyperlipidemia, hypothyroidism, GERD, substance abuse presented to the ED with complaints of diarrhea, abdominal pain, URI symptoms, and generalized weakness.  Patient was diagnosed with COVID-19 with mild URI and GI symptoms.  Also elevated troponin likely demand ischemia.  During the hospitalization patient was started on 5 days of molnupiravir, supportive care and IV fluids.   Assessment & Plan:  Principal Problem:   COVID-19 virus infection Active Problems:   Type 2 diabetes mellitus (HCC)   Diarrhea   Chronic anemia   Elevated troponin   Elevated CK   AKI (acute kidney injury) (HCC)   Serum total bilirubin elevated     COVID-19 viral infection -Active COVID-19 infection with mild GI and URI symptoms.  Chest x-ray is negative.  5 days of molnupiravir without any indication of steroids.  Continue airborne precautions and supplemental oxygen, supportive care  Fever - Secondary to COVID-19 infection.  Supportive care, antipyretics   Elevated troponin Troponin elevated but stable (382> 318) and not consistent with ACS.  Patient is without chest pain.  Continue cardiac monitoring.  Echocardiogram shows EF of 55%, left ventricular hypokinesia and grade 2 diastolic dysfunction.  Would recommend outpatient follow-up with cardiology.   Diarrhea, improved Likely due to COVID-19 viral infection.  GI panel and C. difficile ordered   Elevated CK, rhabdomyolysis, improving Statin on hold getting IV fluids   Type 2 diabetes Currently on sliding scale and Accu-Chek.    Chronic normocytic anemia Hemoglobin is stable     Mild AKI Baseline creatinine 1.0.  Admission creatinine 1.1.  Getting IV  fluids.   Elevated T. Bili T. bili 1.8 but remainder of LFTs normal. -Fractionated bilirubin levels   Hyperlipidemia -Hold statin given elevated CK   Hypertension Hypothyroidism GERD Bilateral lower extremity chronic lymphedema-has been following outpatient lymphedema clinic.  She stopped using her lymphedema pump couple of weeks ago         DVT prophylaxis: Lovenox Code Status: Full code Family Communication:    Status is: Inpatient Continue hospital stay for supportive care at this time, bronchodilators.  We need to make sure she remains afebrile at least for 24 hours.  In the meantime we will get PT/OT to evaluate her transfers to wheelchair.  If everything remains stable hopefully she can go home tomorrow     Body mass index is 49.72 kg/m.         Subjective:  Patient had fevers over last 24 hours, Tmax 102.5.  Antipyretics were given.  Supportive care This morning feels a little better but overall still having some URI symptoms, cough and congestion.  Overall feels weak, lives at home alone.  Mostly wheelchair-bound  Examination:  Constitutional: Not in acute distress, chronically ill Respiratory: Bilateral rhonchi Cardiovascular: Normal sinus rhythm, no rubs Abdomen: Nontender nondistended good bowel sounds Musculoskeletal: No edema noted Skin: Bilateral lower extremity chronic skin changes Neurologic: CN 2-12 grossly intact.  And nonfocal Psychiatric: Normal judgment and insight. Alert and oriented x 3. Normal mood. Objective: Vitals:   12/21/21 0029 12/21/21 0426 12/21/21 0819 12/21/21 1101  BP: (!) 146/66 138/70 (!) 148/70   Pulse: 84 79 77   Resp: Temp: (!) 100.9 F (38.3 C) 99.5 F (37.5 C)  98.9 F (37.2 C)   TempSrc: Oral Oral Oral   SpO2: 95% 98% 96%   Weight:    (!) 144 kg  Height:    5\' 7"  (1.702 m)    Intake/Output Summary (Last 24 hours) at 12/21/2021 1112 Last data filed at 12/21/2021 0500 Gross per 24 hour  Intake 1010  ml  Output 300 ml  Net 710 ml   Filed Weights   12/21/21 1101  Weight: (!) 144 kg     Data Reviewed:   CBC: Recent Labs  Lab 12/19/21 2215 12/20/21 0609 12/20/21 2350 12/21/21 0617  WBC 9.2 6.5 7.6 7.8  NEUTROABS 6.8  --  5.9  --   HGB 9.3* 7.9* 8.8* 8.1*  HCT 29.3* 24.8* 27.6* 25.5*  MCV 94.2 95.4 94.8 96.2  PLT 213 173 194 191   Basic Metabolic Panel: Recent Labs  Lab 12/19/21 2215 12/20/21 2350 12/21/21 0617  NA 137 137 135  K 4.5 3.5 3.2*  CL 105 107 106  CO2 22 20* 21*  GLUCOSE 120* 91 109*  BUN 24* 17 16  CREATININE 1.37* 1.16* 1.14*  CALCIUM 8.8* 8.4* 8.0*  MG  --   --  1.6*   GFR: Estimated Creatinine Clearance: 64.7 mL/min (A) (by C-G formula based on SCr of 1.14 mg/dL (H)). Liver Function Tests: Recent Labs  Lab 12/19/21 2215 12/20/21 0609 12/20/21 2350  AST 30  --  23  ALT 12  --  12  ALKPHOS 83  --  71  BILITOT 1.8* 1.2 1.6*  PROT 8.0  --  6.7  ALBUMIN 3.5  --  2.9*   No results for input(s): "LIPASE", "AMYLASE" in the last 168 hours. No results for input(s): "AMMONIA" in the last 168 hours. Coagulation Profile: No results for input(s): "INR", "PROTIME" in the last 168 hours. Cardiac Enzymes: Recent Labs  Lab 12/20/21 0020 12/20/21 0609  CKTOTAL 551* 489*   BNP (last 3 results) No results for input(s): "PROBNP" in the last 8760 hours. HbA1C: Recent Labs    12/20/21 0609  HGBA1C 5.3   CBG: Recent Labs  Lab 12/20/21 1137 12/20/21 1628 12/20/21 1800 12/20/21 2051 12/21/21 0744  GLUCAP 121* 69* 88 75 94   Lipid Profile: No results for input(s): "CHOL", "HDL", "LDLCALC", "TRIG", "CHOLHDL", "LDLDIRECT" in the last 72 hours. Thyroid Function Tests: No results for input(s): "TSH", "T4TOTAL", "FREET4", "T3FREE", "THYROIDAB" in the last 72 hours. Anemia Panel: Recent Labs    12/20/21 0609  FERRITIN 194   Sepsis Labs: Recent Labs  Lab 12/20/21 2350  PROCALCITON 0.41  LATICACIDVEN 1.3    Recent Results (from the  past 240 hour(s))  Resp Panel by RT-PCR (Flu A&B, Covid) Anterior Nasal Swab     Status: Abnormal   Collection Time: 12/19/21 10:15 PM   Specimen: Anterior Nasal Swab  Result Value Ref Range Status   SARS Coronavirus 2 by RT PCR POSITIVE (A) NEGATIVE Final    Comment: (NOTE) SARS-CoV-2 target nucleic acids are DETECTED.  The SARS-CoV-2 RNA is generally detectable in upper respiratory specimens during the acute phase of infection. Positive results are indicative of the presence of the identified virus, but do not rule out bacterial infection or co-infection with other pathogens not detected by the test. Clinical correlation with patient history and other diagnostic information is necessary to determine patient infection status. The expected result is Negative.  Fact Sheet for Patients: 14/06/23  Fact Sheet for Healthcare Providers: BloggerCourse.com  This test is not yet approved or  cleared by the [place] and  has been authorized for detection and/or diagnosis of SARS-CoV-2 by FDA under an Emergency Use Authorization (EUA).  This EUA will remain in effect (meaning this test can be used) for the duration of  the COVID-19 declaration under Section 564(b)(1) of the A ct, 21 U.S.C. section 360bbb-3(b)(1), unless the authorization is terminated or revoked sooner.     Influenza A by PCR NEGATIVE NEGATIVE Final   Influenza B by PCR NEGATIVE NEGATIVE Final    Comment: (NOTE) The Xpert Xpress SARS-CoV-2/FLU/RSV plus assay is intended as an aid in the diagnosis of influenza from Nasopharyngeal swab specimens and should not be used as a sole basis for treatment. Nasal washings and aspirates are unacceptable for Xpert Xpress SARS-CoV-2/FLU/RSV testing.  Fact Sheet for Patients: BloggerCourse.com  Fact Sheet for Healthcare Providers: SeriousBroker.it  This test is not  yet approved or cleared by the [place] FDA and has been authorized for detection and/or diagnosis of SARS-CoV-2 by FDA under an Emergency Use Authorization (EUA). This EUA will remain in effect (meaning this test can be used) for the duration of the COVID-19 declaration under Section 564(b)(1) of the Act, 21 U.S.C. section 360bbb-3(b)(1), unless the authorization is terminated or revoked.  Performed at [facility], [address] [place], [place] [address]          Radiology Studies: ECHOCARDIOGRAM COMPLETE  Result Date: 12/20/2021    ECHOCARDIOGRAM REPORT   Patient Name:   BRITTON PERKINSON Date of Exam: 12/20/2021 Medical Rec #:  [id number]        Height:       67.0 in Accession #:    [id number]       Weight:       320.0 lb Date of Birth:  1945/03/25        BSA:          2.469 m Patient Age:    76 years         BP:           161/82 mmHg Patient Gender: F                HR:           76 bpm. Exam Location:  Inpatient Procedure: 2D Echo, Cardiac Doppler and Color Doppler Indications:    Elevated Troponin  History:        Patient has no prior history of Echocardiogram examinations.                 Risk Factors:Diabetes, Dyslipidemia and Hypertension.  Sonographer:    Eulah Pont RDCS Referring Phys: [id number] VASUNDHRA RATHORE IMPRESSIONS  1. Left ventricular ejection fraction, by estimation, is 50 to 55%. The left ventricle has low normal function. The left ventricle demonstrates global hypokinesis. There is mild concentric left ventricular hypertrophy. Left ventricular diastolic parameters are consistent with Grade II diastolic dysfunction (pseudonormalization).  2. Right ventricular systolic function is normal. The right ventricular size is mildly enlarged. There is mildly elevated pulmonary artery systolic pressure.  3. Left atrial size was mildly dilated.  4. The mitral valve is normal in structure. Mild mitral valve regurgitation. No evidence of mitral stenosis.  5.  Tricuspid valve regurgitation is moderate.  6. The aortic valve is normal in structure. Aortic valve regurgitation is trivial. Aortic valve sclerosis/calcification is present, without any evidence of aortic stenosis.  7. The inferior vena cava is normal in size with <50% respiratory variability, suggesting right  atrial pressure of 8 mmHg. FINDINGS  Left Ventricle: Left ventricular ejection fraction, by estimation, is 50 to 55%. The left ventricle has low normal function. The left ventricle demonstrates global hypokinesis. The left ventricular internal cavity size was normal in size. There is mild concentric left ventricular hypertrophy. Left ventricular diastolic parameters are consistent with Grade II diastolic dysfunction (pseudonormalization). Right Ventricle: The right ventricular size is mildly enlarged. No increase in right ventricular wall thickness. Right ventricular systolic function is normal. There is mildly elevated pulmonary artery systolic pressure. The tricuspid regurgitant velocity is 2.93 m/s, and with an assumed right atrial pressure of 8 mmHg, the estimated right ventricular systolic pressure is 42.3 mmHg. Left Atrium: Left atrial size was mildly dilated. Right Atrium: Right atrial size was normal in size. Pericardium: There is no evidence of pericardial effusion. Mitral Valve: The mitral valve is normal in structure. Mild mitral annular calcification. Mild mitral valve regurgitation. No evidence of mitral valve stenosis. Tricuspid Valve: The tricuspid valve is normal in structure. Tricuspid valve regurgitation is moderate . No evidence of tricuspid stenosis. Aortic Valve: The aortic valve is normal in structure. Aortic valve regurgitation is trivial. Aortic valve sclerosis/calcification is present, without any evidence of aortic stenosis. Pulmonic Valve: The pulmonic valve was normal in structure. Pulmonic valve regurgitation is not visualized. No evidence of pulmonic stenosis. Aorta: The aortic  root is normal in size and structure. Venous: The inferior vena cava is normal in size with less than 50% respiratory variability, suggesting right atrial pressure of 8 mmHg. IAS/Shunts: No atrial level shunt detected by color flow Doppler.  LEFT VENTRICLE PLAX 2D LVIDd:         4.70 cm      Diastology LVIDs:         3.20 cm      LV e' medial:    5.68 cm/s LV PW:         1.00 cm      LV E/e' medial:  19.4 LV IVS:        1.00 cm      LV e' lateral:   6.89 cm/s LVOT diam:     2.20 cm      LV E/e' lateral: 16.0 LV SV:         73 LV SV Index:   30 LVOT Area:     3.80 cm  LV Volumes (MOD) LV vol d, MOD A2C: 131.0 ml LV vol d, MOD A4C: 137.0 ml LV vol s, MOD A2C: 57.3 ml LV vol s, MOD A4C: 59.3 ml LV SV MOD A2C:     73.7 ml LV SV MOD A4C:     137.0 ml LV SV MOD BP:      74.9 ml RIGHT VENTRICLE RV S prime:     10.80 cm/s TAPSE (M-mode): 2.0 cm LEFT ATRIUM             Index        RIGHT ATRIUM           Index LA diam:        4.30 cm 1.74 cm/m   RA Area:     19.90 cm LA Vol (A2C):   91.6 ml 37.09 ml/m  RA Volume:   60.00 ml  24.30 ml/m LA Vol (A4C):   86.0 ml 34.83 ml/m LA Biplane Vol: 90.4 ml 36.61 ml/m  AORTIC VALVE LVOT Vmax:   91.20 cm/s LVOT Vmean:  61.600 cm/s LVOT VTI:    0.192 m  AORTA  Ao Root diam: 3.20 cm Ao Asc diam:  3.40 cm MITRAL VALVE                TRICUSPID VALVE MV Area (PHT): 4.06 cm     TR Peak grad:   34.3 mmHg MV Decel Time: 187 msec     TR Vmax:        293.00 cm/s MV E velocity: 110.00 cm/s MV A velocity: 107.00 cm/s  SHUNTS MV E/A ratio:  1.03         Systemic VTI:  0.19 m                             Systemic Diam: 2.20 cm Kardie Tobb DO Electronically signed by Thomasene RippleKardie Tobb DO Signature Date/Time: 12/20/2021/10:54:00 AM    Final    DG Chest Portable 1 View  Result Date: 12/19/2021 CLINICAL DATA:  Weakness, diarrhea, abdominal pain, flu-like symptoms for week. EXAM: PORTABLE CHEST 1 VIEW COMPARISON:  08/08/2019. FINDINGS: The heart is enlarged and the mediastinal contour is stable. No  consolidation, effusion, or pneumothorax. Degenerative changes are present in the thoracic spine. No acute osseous abnormality. IMPRESSION: Cardiomegaly with no active disease. Electronically Signed   By: Thornell SartoriusLaura  Taylor M.D.   On: 12/19/2021 22:39        Scheduled Meds:  aspirin  81 mg Oral Daily   enoxaparin (LOVENOX) injection  40 mg Subcutaneous Q24H   fluticasone  1 spray Each Nare Daily   insulin aspart  0-5 Units Subcutaneous QHS   insulin aspart  0-9 Units Subcutaneous TID WC   Ipratropium-Albuterol  1 puff Inhalation BID   levothyroxine  125 mcg Oral QAC breakfast   metoprolol succinate  50 mg Oral Daily   montelukast  10 mg Oral QHS   nirmatrelvir/ritonavir EUA (renal dosing)  2 tablet Oral BID   pantoprazole  40 mg Oral Daily   potassium chloride  40 mEq Oral Q4H   timolol  1 drop Both Eyes BID   Continuous Infusions:  dextrose 5 % and 0.9% NaCl 75 mL/hr at 12/21/21 0548   magnesium sulfate bolus IVPB 4 g (12/21/21 0921)     LOS: 0 days   Time spent= 35 mins    Maryhelen Lindler Joline Maxcyhirag Jamayah Myszka, MD [facility]  If 7PM-7AM, please contact night-coverage  12/21/2021, 11:12 AM

## 2021-12-21 NOTE — Evaluation (Signed)
Physical Therapy Evaluation Patient Details Name: Mary [name] MRN: [id number] DOB: 23-Dec-1945 Today's Date: 12/21/2021  History of Present Illness  76 y.o. female admitted with covid19. PMH: type 2 diabetes, hypertension, diabetic and hypertensive retinopathy, glaucoma, central retinal vein occlusion, hyperlipidemia, hypothyroidism, GERD, substance abuse presented to the ED with complaints of diarrhea, abdominal pain, URI symptoms, and generalized weakness.  Patient was diagnosed with COVID-19 with mild URI and GI symptoms.  Also elevated troponin likely demand ischemia.   Clinical Impression  Pt admitted with above diagnosis. Pt from home, CNA assisting with household chores in the morning and daughter assists pt with w/c to bed transfer at night due to BLE too heavy to lift on her own when getting back into bed. Pt mobilizing around home with electric w/c independently at baseline and using RW for stand pivot transfers. Pt needing min A to come to EOB and mod A to lift BLE back into bed. Donned bariatric grip socks, but still too small and pt's feet sliding when attempting to stand. Notified RN pt needs recliner in room. Recommending ST SNF prior to return home with CNA and daughter assisting as needed. Pt currently with functional limitations due to the deficits listed below (see PT Problem List). Pt will benefit from skilled PT to increase their independence and safety with mobility to allow discharge to the venue listed below.          Recommendations for follow up therapy are one component of a multi-disciplinary discharge planning process, led by the attending physician.  Recommendations may be updated based on patient status, additional functional criteria and insurance authorization.  Follow Up Recommendations Skilled nursing-short term rehab (<3 hours/day)      Assistance Recommended at Discharge Intermittent Supervision/Assistance  Patient can return home with the following  A lot  of help with walking and/or transfers;A lot of help with bathing/dressing/bathroom;Assistance with cooking/housework;Assist for transportation    Equipment Recommendations None recommended by PT  Recommendations for Other Services       Functional Status Assessment Patient has had a recent decline in their functional status and demonstrates the ability to make significant improvements in function in a reasonable and predictable amount of time.     Precautions / Restrictions Precautions Precautions: Fall Precaution Comments: Low vision, BLE lymphedema Restrictions Weight Bearing Restrictions: No      Mobility  Bed Mobility Overal bed mobility: Needs Assistance Bed Mobility: Supine to Sit, Sit to Supine  Supine to sit: Min assist, HOB elevated Sit to supine: Mod assist  General bed mobility comments: min A to assist BLE off of bed, mod A to lift BLE Back into bed and reposition to comfort with pt pulling on bedrails and therapist using bedpad to assist    Transfers  General transfer comment: unable to stand, feet sliding    Ambulation/Gait   Stairs            Wheelchair Mobility    Modified Rankin (Stroke Patients Only)       Balance Overall balance assessment: Needs assistance Sitting-balance support: No upper extremity supported, Feet supported Sitting balance-Leahy Scale: Good Sitting balance - Comments: seated EOB       Pertinent Vitals/Pain Pain Assessment Pain Assessment: Faces Faces Pain Scale: Hurts little more Pain Location: RLE posterior lower leg Pain Descriptors / Indicators: Grimacing, Sore Pain Intervention(s): Limited activity within patient's tolerance, Monitored during session, Repositioned    Home Living Family/patient expects to be discharged to:: Private residence Living Arrangements: Alone  Type of Home: House Home Access: Ramped entrance  Home Layout: One level Home Equipment: Grab bars - toilet;Wheelchair - power Additional  Comments: CNA 5 days a week from [phone]    Prior Function Prior Level of Function : Needs assist  Mobility Comments: transfer to the w/c by herself using RW, hasn't ambulated since Mother's Day, daughter assists her with getting back into bed becuase pt unable to lift BLE due to lymphedema weight ADLs Comments: aide helps with IADLs, reports she gets dressed herself. Washes up on the toilet.     Hand Dominance   Dominant Hand: Right    Extremity/Trunk Assessment   Upper Extremity Assessment Upper Extremity Assessment: Defer to OT evaluation    Lower Extremity Assessment Lower Extremity Assessment: Generalized weakness;RLE deficits/detail;LLE deficits/detail RLE Deficits / Details: lymphedema noted, difficulty mobilizing BLE due to fluid weight, 2-/5 for knee flexion, knee extension, hip abduction, hip adduction, hip flexion LLE Deficits / Details: lymphedema noted, difficulty mobilizing BLE due to fluid weight, 2-/5 for knee flexion, knee extension, hip abduction, hip adduction, hip flexion    Cervical / Trunk Assessment Cervical / Trunk Assessment:  (body habitus)  Communication   Communication: No difficulties  Cognition Arousal/Alertness: Awake/alert Behavior During Therapy: WFL for tasks assessed/performed Overall Cognitive Status: Within Functional Limits for tasks assessed     General Comments      Exercises     Assessment/Plan    PT Assessment Patient needs continued PT services  PT Problem List Decreased strength;Decreased range of motion;Decreased activity tolerance;Decreased balance;Decreased mobility;Obesity;Decreased skin integrity;Pain       PT Treatment Interventions DME instruction;Gait training;Functional mobility training;Therapeutic activities;Therapeutic exercise;Balance training;Patient/family education;Wheelchair mobility training;Modalities    PT Goals (Current goals can be found in the Care Plan section)  Acute Rehab PT Goals Patient Stated Goal:  return home PT Goal Formulation: With patient Time For Goal Achievement: 01/04/22 Potential to Achieve Goals: Fair    Frequency Min 2X/week     Co-evaluation               AM-PAC PT "6 Clicks" Mobility  Outcome Measure Help needed turning from your back to your side while in a flat bed without using bedrails?: A Little Help needed moving from lying on your back to sitting on the side of a flat bed without using bedrails?: A Little Help needed moving to and from a bed to a chair (including a wheelchair)?: Total Help needed standing up from a chair using your arms (e.g., wheelchair or bedside chair)?: Total Help needed to walk in hospital room?: Total Help needed climbing 3-5 steps with a railing? : Total 6 Click Score: 10    End of Session   Activity Tolerance: Patient tolerated treatment well Patient left: in bed;with call bell/phone within reach Nurse Communication: Mobility status;Other (comment) (pt needs recliner in room) PT Visit Diagnosis: Other abnormalities of gait and mobility (R26.89);Muscle weakness (generalized) (M62.81)    Time: 3154-0086 PT Time Calculation (min) (ACUTE ONLY): 24 min   Charges:   PT Evaluation $PT Eval Moderate Complexity: 1 Mod           Tori Jadien Lehigh PT, DPT 12/21/21, 2:52 PM

## 2021-12-21 NOTE — TOC Initial Note (Signed)
Transition of Care [facility]) - Initial/Assessment Note    Patient Details  Name: Mary [name] MRN: [id number] Date of Birth: Jan 12, 1946  Transition [facility] Care [facility]) CM/SW Contact:    Vassie Moselle, LCSW Phone Number: 12/21/2021, 3:29 PM  Clinical Narrative:                 Met with pt who shares she is agreeable to SNF placement. She shares she has been to [facility] in the past. Pt is currently COVID + as of 12/19/21 and will require 10 day isolation period prior to transferring to SNF.  CSW spoke with Estill Bamberg [phone]) at [facility] who shares pt currently receives in home care. She was receiving HHPT/OT however, these services had to stop due to edema. Estill Bamberg shares that they are contracted w/ the following SNF: [place], [place].  CSW will send referrals for SNF closer to the end of pt's isolation period.   Expected Discharge Plan: Skilled Nursing Facility Barriers to Discharge: SNF Covid   Patient Goals and CMS Choice Patient states their goals for this hospitalization and ongoing recovery are:: To go home CMS Medicare.gov Compare Post Acute Care list provided to:: Patient Choice offered to / list presented to : Patient  Expected Discharge Plan and Services Expected Discharge Plan: [place] In-house Referral: NA Discharge Planning Services: CM Consult Post Acute Care Choice: [place] Living arrangements for the past 2 months: Single Family Home                 DME Arranged: N/A DME Agency: NA                  Prior Living Arrangements/Services Living arrangements for the past 2 months: Single Family Home Lives with:: Self Patient language and need for interpreter reviewed:: Yes Do you feel safe going back to the place where you live?: Yes      Need for Family Participation in Patient Care: No (Comment) Care giver support system in place?: Yes (comment) Current home services: DME, Homehealth aide Criminal  Activity/Legal Involvement Pertinent to Current Situation/Hospitalization: No - Comment as needed  Activities of Daily Living Home Assistive Devices/Equipment: Wheelchair ADL Screening (condition at time of admission) Patient's cognitive ability adequate to safely complete daily activities?: Yes Is the patient deaf or have difficulty hearing?: No Does the patient have difficulty seeing, even when wearing glasses/contacts?: No Does the patient have difficulty concentrating, remembering, or making decisions?: No Patient able to express need for assistance with ADLs?: Yes Does the patient have difficulty dressing or bathing?: Yes Independently performs ADLs?: No Communication: Independent Dressing (OT): Needs assistance Is this a change from baseline?: Pre-admission baseline Grooming: Independent Feeding: Independent Bathing: Needs assistance Is this a change from baseline?: Pre-admission baseline Toileting: Needs assistance Is this a change from baseline?: Pre-admission baseline In/Out Bed: Dependent Is this a change from baseline?: Pre-admission baseline Walks in Home: Dependent Is this a change from baseline?: Pre-admission baseline Does the patient have difficulty walking or climbing stairs?: Yes Weakness of Legs: Both Weakness of Arms/Hands: Both  Permission Sought/Granted Permission sought to share information with : Facility Sport and exercise psychologist, Case Optician, dispensing granted to share information with : Yes, Verbal Permission Granted     Permission granted to share info w AGENCY: [facility]        Emotional Assessment Appearance:: Appears stated age Attitude/Demeanor/Rapport: Engaged Affect (typically observed): Accepting Orientation: : Oriented to Self, Oriented to Place, Oriented to  Time, Oriented to Situation Alcohol / Substance Use: Not Applicable Psych Involvement: No (comment)  Admission diagnosis:  Elevated troponin [R79.89] Generalized weakness  [R53.1] COVID-19 virus infection [U07.1] COVID-19 [U07.1] Patient Active Problem List   Diagnosis Date Noted   COVID-19 virus infection 12/20/2021   Elevated troponin 12/20/2021   Elevated CK 12/20/2021   AKI (acute kidney injury) ([place]) 12/20/2021   Serum total bilirubin elevated 12/20/2021   Lymphedema 08/10/2021   Diarrhea 01/20/2020   Chronic anemia 01/20/2020   Colon cancer screening 01/20/2020   Type 2 diabetes mellitus ([place]) 02/21/2018   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   Primary osteoarthritis of both knees 02/21/2018   Class 3 severe obesity due to excess calories with serious comorbidity and body mass index (BMI) of 45.0 to 49.9 in adult [facility]) 02/21/2018   Skin ulcer of right foot with fat layer exposed ([place]) 02/18/2018   Morbid (severe) obesity due to excess calories ([place]) 11/18/2017   Knee pain 07/16/2015   Lateral dislocation of right patella 05/04/2015   Rupture of right quadriceps tendon 05/24/2014   S/P TKR (total knee replacement) 05/05/2012   PCP:  Merlene Laughter, MD Pharmacy:   [id number] [place] [place]Phone: [phone]: [phone] [facility] [address]Phone: [phone]: [address] [address]Phone: [phone]: [phone]    Social Determinants of Health (SDOH) Interventions Food Insecurity Interventions: Intervention Not Indicated Housing Interventions: Intervention Not Indicated Utilities Interventions: Intervention Not Indicated  Readmission Risk Interventions    12/21/2021    3:27 PM 12/21/2021   12:27 PM  Readmission Risk Prevention Plan  Post Dischage Appt Complete Complete   Medication Screening Complete Complete  Transportation Screening Complete Complete

## 2021-12-21 NOTE — Progress Notes (Signed)
   12/20/21 2326  Assess: MEWS Score  Temp (!) 101.5 F (38.6 C)  BP (!) 158/75  MAP (mmHg) 99  Pulse Rate 93  Resp 18  SpO2 99 %  Assess: MEWS Score  MEWS Temp 2  MEWS Systolic 0  MEWS Pulse 0  MEWS RR 0  MEWS LOC 0  MEWS Score 2  MEWS Score Color Yellow  Assess: if the MEWS score is Yellow or Red  Were vital signs taken at a resting state? Yes  Focused Assessment Change from prior assessment (see assessment flowsheet)  Does the patient meet 2 or more of the SIRS criteria? Yes  Does the patient have a confirmed or suspected source of infection? Yes  Provider and Rapid Response Notified? Yes  MEWS guidelines implemented *See Row Information* Yes  Treat  MEWS Interventions Escalated (See documentation below)  Notify: Charge Nurse/RN  Name of Charge Nurse/RN Notified T Kilea Mccarey  Date Charge Nurse/RN Notified 12/20/21  Time Charge Nurse/RN Notified 2330  Provider Notification  Provider Name/Title Virgel Manifold  Date Provider Notified 12/20/21  Time Provider Notified 2330  Method of Notification Page  Notification Reason Change in status  Provider response See new orders  Date of Provider Response 12/20/21  Time of Provider Response 2340  Document  Patient Outcome Stabilized after interventions  Progress note created (see row info) Yes  Assess: SIRS CRITERIA  SIRS Temperature  1  SIRS Pulse 1  SIRS Respirations  0  SIRS WBC 1  SIRS Score Sum  3

## 2021-12-22 DIAGNOSIS — U071 COVID-19: Secondary | ICD-10-CM | POA: Diagnosis not present

## 2021-12-22 LAB — CBC
HCT: 25.9 % — ABNORMAL LOW (ref 36.0–46.0)
Hemoglobin: 8.1 g/dL — ABNORMAL LOW (ref 12.0–15.0)
MCH: 30.3 pg (ref 26.0–34.0)
MCHC: 31.3 g/dL (ref 30.0–36.0)
MCV: 97 fL (ref 80.0–100.0)
Platelets: 230 10*3/uL (ref 150–400)
RBC: 2.67 MIL/uL — ABNORMAL LOW (ref 3.87–5.11)
RDW: 13.9 % (ref 11.5–15.5)
WBC: 8.9 10*3/uL (ref 4.0–10.5)
nRBC: 0 % (ref 0.0–0.2)

## 2021-12-22 LAB — GASTROINTESTINAL PANEL BY PCR, STOOL (REPLACES STOOL CULTURE)

## 2021-12-22 LAB — C DIFFICILE QUICK SCREEN W PCR REFLEX
C Diff antigen: NEGATIVE
C Diff interpretation: NOT DETECTED
C Diff toxin: NEGATIVE

## 2021-12-22 LAB — BASIC METABOLIC PANEL
Anion gap: 8 (ref 5–15)
BUN: 16 mg/dL (ref 8–23)
CO2: 20 mmol/L — ABNORMAL LOW (ref 22–32)
Calcium: 8.4 mg/dL — ABNORMAL LOW (ref 8.9–10.3)
Chloride: 109 mmol/L (ref 98–111)
Creatinine, Ser: 1.03 mg/dL — ABNORMAL HIGH (ref 0.44–1.00)
GFR, Estimated: 57 mL/min — ABNORMAL LOW (ref >60)
Glucose, Bld: 81 mg/dL (ref 70–99)
Potassium: 3.9 mmol/L (ref 3.5–5.1)
Sodium: 137 mmol/L (ref 135–145)

## 2021-12-22 LAB — GLUCOSE, CAPILLARY
Glucose-Capillary: 70 mg/dL (ref 70–99)
Glucose-Capillary: 89 mg/dL (ref 70–99)
Glucose-Capillary: 81 mg/dL (ref 70–99)
Glucose-Capillary: 86 mg/dL (ref 70–99)

## 2021-12-22 LAB — MAGNESIUM: Magnesium: 1.9 mg/dL (ref 1.7–2.4)

## 2021-12-22 MED ORDER — AMLODIPINE BESYLATE 5 MG PO TABS
5.0000 mg | ORAL_TABLET | Freq: Every day | ORAL | Status: DC
  Administered 2021-12-22 – 2021-12-30 (×9): 5 mg via ORAL
  Filled 2021-12-22 (×9): qty 1

## 2021-12-22 NOTE — Progress Notes (Signed)
PROGRESS NOTE    Mary [name]  FXO:[id number] DOB: April 29, 1945 DOA: 12/19/2021 PCP: Angela Cox, MD   Brief Narrative:   76 y.o. female with medical history significant of type 2 diabetes, hypertension, diabetic and hypertensive retinopathy, glaucoma, central retinal vein occlusion, hyperlipidemia, hypothyroidism, GERD, substance abuse presented to the ED with complaints of diarrhea, abdominal pain, URI symptoms, and generalized weakness.  Patient was diagnosed with COVID-19 with mild URI and GI symptoms.  Also elevated troponin likely demand ischemia.  During the hospitalization patient was started on 5 days of molnupiravir, supportive care and IV fluids.  PT/OT recommended SNF, TOC working on placement at this time.  Patient is very motivated to work with therapy, if she continues to improve.  She is agreeable to go home instead of waiting.   Assessment & Plan:  Principal Problem:   COVID-19 virus infection Active Problems:   Type 2 diabetes mellitus (HCC)   Diarrhea   Chronic anemia   Elevated troponin   Elevated CK   AKI (acute kidney injury) (HCC)   Serum total bilirubin elevated     COVID-19 viral infection -Active COVID-19 infection with mild GI and URI symptoms.  Chest x-ray is negative.  5 days of molnupiravir without any indication of steroids.  Continue airborne precaution, supplemental oxygen and supportive care  Fever - Secondary to COVID-19, supportive care.  Antipyretics as needed.   Elevated troponin Troponin elevated but stable (382> 318) and not consistent with ACS.  Patient is without chest pain.  Continue cardiac monitoring.  Echocardiogram shows EF of 55%, left ventricular hypokinesia and grade 2 diastolic dysfunction.  Would recommend outpatient follow-up with cardiology.   Diarrhea, improved This has resolved.  Nursing staff unable to get sample for GI panel and C. difficile.   Elevated CK, rhabdomyolysis, improving Patient received gentle  hydration.  Statin on hold  Essential hypertension, uncontrolled - Add Norvasc 5 mg p.o. daily and as needed hydralazine/Lopressor ordered On home Toprol-XL 50 mg daily   Type 2 diabetes Currently on sliding scale and Accu-Chek.  Blood sugars running borderline low therefore we will liberalize her diet   Chronic normocytic anemia Hemoglobin is stable     Mild AKI Baseline creatinine 1.0.  Admission creatinine 1.1.  Getting IV fluids.   Elevated T. Bili T. bili 1.8 but remainder of LFTs normal. -Fractionated bilirubin levels   Hyperlipidemia -Hold statin given elevated CK   Hypothyroidism-Synthroid GERD Bilateral lower extremity chronic lymphedema-has been following outpatient lymphedema clinic.  She stopped using her lymphedema pump couple of weeks ago         DVT prophylaxis: Lovenox Code Status: Full code Family Communication:    Status is: Inpatient Overall patient is medically stable, per TOC and skilled nursing facility patient needs to be in isolation for total of 10 days.  She was diagnosed of COVID-19 on 12/19/2021.  Will possibly go home if she continues to make improvement with maximum home health  Body mass index is 49.72 kg/m.         Subjective:  Seen and examined at bedside, overall feels weak but significantly better.  Thinks rehab will help her but if she continues to improve, she is agreeable to go home with home health  Examination: Constitutional: Not in acute distress Respiratory: Clear to auscultation bilaterally Cardiovascular: Normal sinus rhythm, no rubs Abdomen: Nontender nondistended good bowel sounds Musculoskeletal: No edema noted Skin: Chronic bilateral lower extremity skin changes/lymphedema. Neurologic: CN 2-12 grossly intact.  And nonfocal Psychiatric:  Normal judgment and insight. Alert and oriented x 3. Normal mood.  Objective: Vitals:   12/21/21 1818 12/21/21 1928 12/21/21 1933 12/22/21 0518  BP: 124/76  (!) 166/66 (!)  174/78  Pulse: 87  72 81  Resp: Temp: 99.1 F (37.3 C)  98.8 F (37.1 C) 98.6 F (37 C)  TempSrc: Oral  Oral Oral  SpO2: 99% 97% 98% 98%  Weight:      Height:        Intake/Output Summary (Last 24 hours) at 12/22/2021 0739 Last data filed at 12/22/2021 0524 Gross per 24 hour  Intake 620 ml  Output 500 ml  Net 120 ml   Filed Weights   12/21/21 1101  Weight: (!) 144 kg     Data Reviewed:   CBC: Recent Labs  Lab 12/19/21 2215 12/20/21 0609 12/20/21 2350 12/21/21 0617 12/22/21 0633  WBC 9.2 6.5 7.6 7.8 8.9  NEUTROABS 6.8  --  5.9  --   --   HGB 9.3* 7.9* 8.8* 8.1* 8.1*  HCT 29.3* 24.8* 27.6* 25.5* 25.9*  MCV 94.2 95.4 94.8 96.2 97.0  PLT 213 173 194 191 230   Basic Metabolic Panel: Recent Labs  Lab 12/19/21 2215 12/20/21 2350 12/21/21 0617 12/22/21 0633  NA 137 137 135 137  K 4.5 3.5 3.2* 3.9  CL 105 107 106 109  CO2 22 20* 21* 20*  GLUCOSE 120* 91 109* 81  BUN 24* CREATININE 1.37* 1.16* 1.14* 1.03*  CALCIUM 8.8* 8.4* 8.0* 8.4*  MG  --   --  1.6* 1.9   GFR: Estimated Creatinine Clearance: 71.6 mL/min (A) (by C-G formula based on SCr of 1.03 mg/dL (H)). Liver Function Tests: Recent Labs  Lab 12/19/21 2215 12/20/21 0609 12/20/21 2350  AST 30  --  23  ALT 12  --  12  ALKPHOS 83  --  71  BILITOT 1.8* 1.2 1.6*  PROT 8.0  --  6.7  ALBUMIN 3.5  --  2.9*   No results for input(s): "LIPASE", "AMYLASE" in the last 168 hours. No results for input(s): "AMMONIA" in the last 168 hours. Coagulation Profile: No results for input(s): "INR", "PROTIME" in the last 168 hours. Cardiac Enzymes: Recent Labs  Lab 12/20/21 0020 12/20/21 0609  CKTOTAL 551* 489*   BNP (last 3 results) No results for input(s): "PROBNP" in the last 8760 hours. HbA1C: Recent Labs    12/20/21 0609  HGBA1C 5.3   CBG: Recent Labs  Lab 12/20/21 2051 12/21/21 0744 12/21/21 1127 12/21/21 1642 12/21/21 2200  GLUCAP 75 94 91 113* 94   Lipid Profile: No  results for input(s): "CHOL", "HDL", "LDLCALC", "TRIG", "CHOLHDL", "LDLDIRECT" in the last 72 hours. Thyroid Function Tests: No results for input(s): "TSH", "T4TOTAL", "FREET4", "T3FREE", "THYROIDAB" in the last 72 hours. Anemia Panel: Recent Labs    12/20/21 0609  FERRITIN 194   Sepsis Labs: Recent Labs  Lab 12/20/21 2350  PROCALCITON 0.41  LATICACIDVEN 1.3    Recent Results (from the past 240 hour(s))  Resp Panel by RT-PCR (Flu A&B, Covid) Anterior Nasal Swab     Status: Abnormal   Collection Time: 12/19/21 10:15 PM   Specimen: Anterior Nasal Swab  Result Value Ref Range Status   SARS Coronavirus 2 by RT PCR POSITIVE (A) NEGATIVE Final    Comment: (NOTE) SARS-CoV-2 target nucleic acids are DETECTED.  The SARS-CoV-2 RNA is generally detectable in upper respiratory specimens during the acute phase of infection.  Positive results are indicative of the presence of the identified virus, but do not rule out bacterial infection or co-infection with other pathogens not detected by the test. Clinical correlation with patient history and other diagnostic information is necessary to determine patient infection status. The expected result is Negative.  Fact Sheet for Patients: BloggerCourse.com  Fact Sheet for Healthcare Providers: SeriousBroker.it  This test is not yet approved or cleared by the [place] FDA and  has been authorized for detection and/or diagnosis of SARS-CoV-2 by FDA under an Emergency Use Authorization (EUA).  This EUA will remain in effect (meaning this test can be used) for the duration of  the COVID-19 declaration under Section 564(b)(1) of the A ct, 21 U.S.C. section 360bbb-3(b)(1), unless the authorization is terminated or revoked sooner.     Influenza A by PCR NEGATIVE NEGATIVE Final   Influenza B by PCR NEGATIVE NEGATIVE Final    Comment: (NOTE) The Xpert Xpress SARS-CoV-2/FLU/RSV plus assay is  intended as an aid in the diagnosis of influenza from Nasopharyngeal swab specimens and should not be used as a sole basis for treatment. Nasal washings and aspirates are unacceptable for Xpert Xpress SARS-CoV-2/FLU/RSV testing.  Fact Sheet for Patients: BloggerCourse.com  Fact Sheet for Healthcare Providers: SeriousBroker.it  This test is not yet approved or cleared by the [place] FDA and has been authorized for detection and/or diagnosis of SARS-CoV-2 by FDA under an Emergency Use Authorization (EUA). This EUA will remain in effect (meaning this test can be used) for the duration of the COVID-19 declaration under Section 564(b)(1) of the Act, 21 U.S.C. section 360bbb-3(b)(1), unless the authorization is terminated or revoked.  Performed at [facility], [address], [place], [place] [address]          Radiology Studies: ECHOCARDIOGRAM COMPLETE  Result Date: 12/20/2021    ECHOCARDIOGRAM REPORT   Patient Name:   Mary [name] Date of Exam: 12/20/2021 Medical Rec #:  [id number]        Height:       67.0 in Accession #:    [id number]       Weight:       320.0 lb Date of Birth:  January 01, 1946        BSA:          2.469 m Patient Age:    76 years         BP:           161/82 mmHg Patient Gender: F                HR:           76 bpm. Exam Location:  Inpatient Procedure: 2D Echo, Cardiac Doppler and Color Doppler Indications:    Elevated Troponin  History:        Patient has no prior history of Echocardiogram examinations.                 Risk Factors:Diabetes, Dyslipidemia and Hypertension.  Sonographer:    Eulah Pont RDCS Referring Phys: [id number] VASUNDHRA RATHORE IMPRESSIONS  1. Left ventricular ejection fraction, by estimation, is 50 to 55%. The left ventricle has low normal function. The left ventricle demonstrates global hypokinesis. There is mild concentric left ventricular hypertrophy. Left ventricular  diastolic parameters are consistent with Grade II diastolic dysfunction (pseudonormalization).  2. Right ventricular systolic function is normal. The right ventricular size is mildly enlarged. There is mildly elevated pulmonary artery systolic pressure.  3. Left atrial  size was mildly dilated.  4. The mitral valve is normal in structure. Mild mitral valve regurgitation. No evidence of mitral stenosis.  5. Tricuspid valve regurgitation is moderate.  6. The aortic valve is normal in structure. Aortic valve regurgitation is trivial. Aortic valve sclerosis/calcification is present, without any evidence of aortic stenosis.  7. The inferior vena cava is normal in size with <50% respiratory variability, suggesting right atrial pressure of 8 mmHg. FINDINGS  Left Ventricle: Left ventricular ejection fraction, by estimation, is 50 to 55%. The left ventricle has low normal function. The left ventricle demonstrates global hypokinesis. The left ventricular internal cavity size was normal in size. There is mild concentric left ventricular hypertrophy. Left ventricular diastolic parameters are consistent with Grade II diastolic dysfunction (pseudonormalization). Right Ventricle: The right ventricular size is mildly enlarged. No increase in right ventricular wall thickness. Right ventricular systolic function is normal. There is mildly elevated pulmonary artery systolic pressure. The tricuspid regurgitant velocity is 2.93 m/s, and with an assumed right atrial pressure of 8 mmHg, the estimated right ventricular systolic pressure is 42.3 mmHg. Left Atrium: Left atrial size was mildly dilated. Right Atrium: Right atrial size was normal in size. Pericardium: There is no evidence of pericardial effusion. Mitral Valve: The mitral valve is normal in structure. Mild mitral annular calcification. Mild mitral valve regurgitation. No evidence of mitral valve stenosis. Tricuspid Valve: The tricuspid valve is normal in structure. Tricuspid  valve regurgitation is moderate . No evidence of tricuspid stenosis. Aortic Valve: The aortic valve is normal in structure. Aortic valve regurgitation is trivial. Aortic valve sclerosis/calcification is present, without any evidence of aortic stenosis. Pulmonic Valve: The pulmonic valve was normal in structure. Pulmonic valve regurgitation is not visualized. No evidence of pulmonic stenosis. Aorta: The aortic root is normal in size and structure. Venous: The inferior vena cava is normal in size with less than 50% respiratory variability, suggesting right atrial pressure of 8 mmHg. IAS/Shunts: No atrial level shunt detected by color flow Doppler.  LEFT VENTRICLE PLAX 2D LVIDd:         4.70 cm      Diastology LVIDs:         3.20 cm      LV e' medial:    5.68 cm/s LV PW:         1.00 cm      LV E/e' medial:  19.4 LV IVS:        1.00 cm      LV e' lateral:   6.89 cm/s LVOT diam:     2.20 cm      LV E/e' lateral: 16.0 LV SV:         73 LV SV Index:   30 LVOT Area:     3.80 cm  LV Volumes (MOD) LV vol d, MOD A2C: 131.0 ml LV vol d, MOD A4C: 137.0 ml LV vol s, MOD A2C: 57.3 ml LV vol s, MOD A4C: 59.3 ml LV SV MOD A2C:     73.7 ml LV SV MOD A4C:     137.0 ml LV SV MOD BP:      74.9 ml RIGHT VENTRICLE RV S prime:     10.80 cm/s TAPSE (M-mode): 2.0 cm LEFT ATRIUM             Index        RIGHT ATRIUM           Index LA diam:        4.30 cm  1.74 cm/m   RA Area:     19.90 cm LA Vol (A2C):   91.6 ml 37.09 ml/m  RA Volume:   60.00 ml  24.30 ml/m LA Vol (A4C):   86.0 ml 34.83 ml/m LA Biplane Vol: 90.4 ml 36.61 ml/m  AORTIC VALVE LVOT Vmax:   91.20 cm/s LVOT Vmean:  61.600 cm/s LVOT VTI:    0.192 m  AORTA Ao Root diam: 3.20 cm Ao Asc diam:  3.40 cm MITRAL VALVE                TRICUSPID VALVE MV Area (PHT): 4.06 cm     TR Peak grad:   34.3 mmHg MV Decel Time: 187 msec     TR Vmax:        293.00 cm/s MV E velocity: 110.00 cm/s MV A velocity: 107.00 cm/s  SHUNTS MV E/A ratio:  1.03         Systemic VTI:  0.19 m                              Systemic Diam: 2.20 cm Kardie Tobb DO Electronically signed by Thomasene Ripple DO Signature Date/Time: 12/20/2021/10:54:00 AM    Final         Scheduled Meds:  aspirin  81 mg Oral Daily   enoxaparin (LOVENOX) injection  40 mg Subcutaneous Q24H   fluticasone  1 spray Each Nare Daily   insulin aspart  0-5 Units Subcutaneous QHS   insulin aspart  0-9 Units Subcutaneous TID WC   Ipratropium-Albuterol  1 puff Inhalation BID   levothyroxine  125 mcg Oral QAC breakfast   metoprolol succinate  50 mg Oral Daily   montelukast  10 mg Oral QHS   nirmatrelvir/ritonavir EUA (renal dosing)  2 tablet Oral BID   pantoprazole  40 mg Oral Daily   timolol  1 drop Both Eyes BID   Continuous Infusions:     LOS: 1 day   Time spent= 35 mins    Crystina Borrayo Joline Maxcy, MD [facility]  If 7PM-7AM, please contact night-coverage  12/22/2021, 7:39 AM

## 2021-12-23 DIAGNOSIS — U071 COVID-19: Secondary | ICD-10-CM | POA: Diagnosis not present

## 2021-12-23 LAB — CBC
HCT: 24.4 % — ABNORMAL LOW (ref 36.0–46.0)
Hemoglobin: 7.9 g/dL — ABNORMAL LOW (ref 12.0–15.0)
MCH: 30 pg (ref 26.0–34.0)
MCHC: 32.4 g/dL (ref 30.0–36.0)
MCV: 92.8 fL (ref 80.0–100.0)
Platelets: 262 10*3/uL (ref 150–400)
RBC: 2.63 MIL/uL — ABNORMAL LOW (ref 3.87–5.11)
RDW: 13.7 % (ref 11.5–15.5)
WBC: 9 10*3/uL (ref 4.0–10.5)
nRBC: 0.2 % (ref 0.0–0.2)

## 2021-12-23 LAB — BASIC METABOLIC PANEL
Anion gap: 7 (ref 5–15)
BUN: 15 mg/dL (ref 8–23)
CO2: 23 mmol/L (ref 22–32)
Calcium: 8.5 mg/dL — ABNORMAL LOW (ref 8.9–10.3)
Chloride: 107 mmol/L (ref 98–111)
Creatinine, Ser: 0.96 mg/dL (ref 0.44–1.00)
GFR, Estimated: >60 mL/min (ref >60)
Glucose, Bld: 74 mg/dL (ref 70–99)
Potassium: 3.8 mmol/L (ref 3.5–5.1)
Sodium: 137 mmol/L (ref 135–145)

## 2021-12-23 LAB — MAGNESIUM: Magnesium: 1.8 mg/dL (ref 1.7–2.4)

## 2021-12-23 LAB — GLUCOSE, CAPILLARY
Glucose-Capillary: 68 mg/dL — ABNORMAL LOW (ref 70–99)
Glucose-Capillary: 82 mg/dL (ref 70–99)
Glucose-Capillary: 101 mg/dL — ABNORMAL HIGH (ref 70–99)
Glucose-Capillary: 96 mg/dL (ref 70–99)

## 2021-12-23 MED ORDER — POTASSIUM CHLORIDE CRYS ER 20 MEQ PO TBCR
40.0000 meq | EXTENDED_RELEASE_TABLET | Freq: Once | ORAL | Status: AC
  Administered 2021-12-23: 40 meq via ORAL
  Filled 2021-12-23: qty 2

## 2021-12-23 MED ORDER — MAGNESIUM OXIDE -MG SUPPLEMENT 400 (240 MG) MG PO TABS
800.0000 mg | ORAL_TABLET | Freq: Once | ORAL | Status: AC
  Administered 2021-12-23: 800 mg via ORAL
  Filled 2021-12-23: qty 2

## 2021-12-23 NOTE — Progress Notes (Signed)
PROGRESS NOTE    Mary [name]  TWS:[id number] DOB: 1945/05/04 DOA: 12/19/2021 PCP: Angela Cox, MD   Brief Narrative:   76 y.o. female with medical history significant of type 2 diabetes, hypertension, diabetic and hypertensive retinopathy, glaucoma, central retinal vein occlusion, hyperlipidemia, hypothyroidism, GERD, substance abuse presented to the ED with complaints of diarrhea, abdominal pain, URI symptoms, and generalized weakness.  Patient was diagnosed with COVID-19 with mild URI and GI symptoms.  Also elevated troponin likely demand ischemia.  During the hospitalization patient was started on 5 days of molnupiravir, supportive care and IV fluids.  PT/OT recommended SNF, TOC working on placement at this time.  Patient is very motivated to work with therapy, if she continues to improve.  She is agreeable to go home instead of waiting.   Assessment & Plan:  Principal Problem:   COVID-19 virus infection Active Problems:   Type 2 diabetes mellitus (HCC)   Diarrhea   Chronic anemia   Elevated troponin   Elevated CK   AKI (acute kidney injury) (HCC)   Serum total bilirubin elevated     COVID-19 viral infection -Active COVID-19 infection with mild GI and URI symptoms.  Chest x-ray is negative.  5 days of molnupiravir without any indication of steroids.  Continue airborne precaution, supplemental oxygen and supportive care  Fever - Secondary to COVID-19, supportive care.  Antipyretics as needed.   Elevated troponin Troponin elevated but stable (382> 318) and not consistent with ACS.  Patient is without chest pain.  Continue cardiac monitoring.  Echocardiogram shows EF of 55%, left ventricular hypokinesia and grade 2 diastolic dysfunction.  Would recommend outpatient follow-up with cardiology.   Diarrhea, improved This has resolved.  Nursing staff unable to get sample for GI panel and C. difficile.   Elevated CK, rhabdomyolysis, improving Patient received gentle  hydration.  Statin on hold  Essential hypertension, uncontrolled - Add Norvasc 5 mg p.o. daily and as needed hydralazine/Lopressor ordered On home Toprol-XL 50 mg daily   Type 2 diabetes Currently on sliding scale and Accu-Chek.  Blood sugars running borderline low therefore we will liberalize her diet   Chronic normocytic anemia Hemoglobin is stable     Mild AKI Baseline creatinine 1.0.  Admission creatinine 1.1.  Getting IV fluids.   Elevated T. Bili T. bili 1.8 but remainder of LFTs normal. -Fractionated bilirubin levels   Hyperlipidemia -Hold statin given elevated CK   Hypothyroidism-Synthroid GERD Bilateral lower extremity chronic lymphedema-has been following outpatient lymphedema clinic.  She stopped using her lymphedema pump couple of weeks ago         DVT prophylaxis: Lovenox Code Status: Full code Family Communication:    Status is: Inpatient Overall patient is medically stable, per TOC and skilled nursing facility patient needs to be in isolation for total of 10 days.  She was diagnosed of COVID-19 on 12/19/2021.  Will possibly go home if she continues to make improvement with maximum home health  Body mass index is 49.72 kg/m.         Subjective:  Seen and examined at bedside, no complaints this morning.  Overall feels well.  Still wanting to go to SNF  Examination:  Constitutional: Not in acute distress, chronically ill Respiratory: Clear to auscultation bilaterally Cardiovascular: Normal sinus rhythm, no rubs Abdomen: Nontender nondistended good bowel sounds Musculoskeletal: No edema noted Skin: No rashes seen Neurologic: CN 2-12 grossly intact.  And nonfocal Psychiatric: Normal judgment and insight. Alert and oriented x 3. Normal mood. Objective:  Vitals:   12/22/21 1410 12/22/21 2017 12/22/21 2050 12/23/21 0500  BP: (!) 159/110 (!) 199/81 (!) 176/75 (!) 150/63  Pulse: 86 91 90 84  Resp: 19 20  19   Temp: 99.1 F (37.3 C) 100.1 F (37.8  C)  99.3 F (37.4 C)  TempSrc:  Oral  Oral  SpO2: 94% 100%  95%  Weight:      Height:        Intake/Output Summary (Last 24 hours) at 12/23/2021 1204 Last data filed at 12/23/2021 0135 Gross per 24 hour  Intake 340 ml  Output 1800 ml  Net -1460 ml   Filed Weights   12/21/21 1101  Weight: (!) 144 kg     Data Reviewed:   CBC: Recent Labs  Lab 12/19/21 2215 12/20/21 0609 12/20/21 2350 12/21/21 0617 12/22/21 0633 12/23/21 0543  WBC 9.2 6.5 7.6 7.8 8.9 9.0  NEUTROABS 6.8  --  5.9  --   --   --   HGB 9.3* 7.9* 8.8* 8.1* 8.1* 7.9*  HCT 29.3* 24.8* 27.6* 25.5* 25.9* 24.4*  MCV 94.2 95.4 94.8 96.2 97.0 92.8  PLT 213 173 194 191 230 262   Basic Metabolic Panel: Recent Labs  Lab 12/19/21 2215 12/20/21 2350 12/21/21 0617 12/22/21 0633 12/23/21 0543  NA 137 137 135 137 137  K 4.5 3.5 3.2* 3.9 3.8  CL 105 107 106 109 107  CO2 22 20* 21* 20* 23  GLUCOSE 120* 91 109* 81 74  BUN 24* 17 16 16 15   CREATININE 1.37* 1.16* 1.14* 1.03* 0.96  CALCIUM 8.8* 8.4* 8.0* 8.4* 8.5*  MG  --   --  1.6* 1.9 1.8   GFR: Estimated Creatinine Clearance: 76.8 mL/min (by C-G formula based on SCr of 0.96 mg/dL). Liver Function Tests: Recent Labs  Lab 12/19/21 2215 12/20/21 0609 12/20/21 2350  AST 30  --  23  ALT 12  --  12  ALKPHOS 83  --  71  BILITOT 1.8* 1.2 1.6*  PROT 8.0  --  6.7  ALBUMIN 3.5  --  2.9*   No results for input(s): "LIPASE", "AMYLASE" in the last 168 hours. No results for input(s): "AMMONIA" in the last 168 hours. Coagulation Profile: No results for input(s): "INR", "PROTIME" in the last 168 hours. Cardiac Enzymes: Recent Labs  Lab 12/20/21 0020 12/20/21 0609  CKTOTAL 551* 489*   BNP (last 3 results) No results for input(s): "PROBNP" in the last 8760 hours. HbA1C: No results for input(s): "HGBA1C" in the last 72 hours.  CBG: Recent Labs  Lab 12/22/21 0741 12/22/21 1154 12/22/21 1619 12/22/21 2112 12/23/21 0906  GLUCAP 70 89 81 86 68*   Lipid  Profile: No results for input(s): "CHOL", "HDL", "LDLCALC", "TRIG", "CHOLHDL", "LDLDIRECT" in the last 72 hours. Thyroid Function Tests: No results for input(s): "TSH", "T4TOTAL", "FREET4", "T3FREE", "THYROIDAB" in the last 72 hours. Anemia Panel: No results for input(s): "VITAMINB12", "FOLATE", "FERRITIN", "TIBC", "IRON", "RETICCTPCT" in the last 72 hours.  Sepsis Labs: Recent Labs  Lab 12/20/21 2350  PROCALCITON 0.41  LATICACIDVEN 1.3    Recent Results (from the past 240 hour(s))  Resp Panel by RT-PCR (Flu A&B, Covid) Anterior Nasal Swab     Status: Abnormal   Collection Time: 12/19/21 10:15 PM   Specimen: Anterior Nasal Swab  Result Value Ref Range Status   SARS Coronavirus 2 by RT PCR POSITIVE (A) NEGATIVE Final    Comment: (NOTE) SARS-CoV-2 target nucleic acids are DETECTED.  The SARS-CoV-2 RNA is generally  detectable in upper respiratory specimens during the acute phase of infection. Positive results are indicative of the presence of the identified virus, but do not rule out bacterial infection or co-infection with other pathogens not detected by the test. Clinical correlation with patient history and other diagnostic information is necessary to determine patient infection status. The expected result is Negative.  Fact Sheet for Patients: BloggerCourse.com  Fact Sheet for Healthcare Providers: SeriousBroker.it  This test is not yet approved or cleared by the [place] FDA and  has been authorized for detection and/or diagnosis of SARS-CoV-2 by FDA under an Emergency Use Authorization (EUA).  This EUA will remain in effect (meaning this test can be used) for the duration of  the COVID-19 declaration under Section 564(b)(1) of the A ct, 21 U.S.C. section 360bbb-3(b)(1), unless the authorization is terminated or revoked sooner.     Influenza A by PCR NEGATIVE NEGATIVE Final   Influenza B by PCR NEGATIVE NEGATIVE  Final    Comment: (NOTE) The Xpert Xpress SARS-CoV-2/FLU/RSV plus assay is intended as an aid in the diagnosis of influenza from Nasopharyngeal swab specimens and should not be used as a sole basis for treatment. Nasal washings and aspirates are unacceptable for Xpert Xpress SARS-CoV-2/FLU/RSV testing.  Fact Sheet for Patients: BloggerCourse.com  Fact Sheet for Healthcare Providers: SeriousBroker.it  This test is not yet approved or cleared by the [place] FDA and has been authorized for detection and/or diagnosis of SARS-CoV-2 by FDA under an Emergency Use Authorization (EUA). This EUA will remain in effect (meaning this test can be used) for the duration of the COVID-19 declaration under Section 564(b)(1) of the Act, 21 U.S.C. section 360bbb-3(b)(1), unless the authorization is terminated or revoked.  Performed at [facility], [address], [place], [place] [address]   Culture, blood (x 2)     Status: None (Preliminary result)   Collection Time: 12/20/21 11:50 PM   Specimen: BLOOD  Result Value Ref Range Status   Specimen Description   Final    BLOOD BLOOD RIGHT HAND Performed at [facility], [address], [place], [place] [address]    Special Requests   Final    BOTTLES DRAWN AEROBIC AND ANAEROBIC Blood Culture results may not be optimal due to an inadequate volume of blood received in culture bottles Performed at [facility], [address], [place], [place] [address]    Culture   Final    NO GROWTH 2 DAYS Performed at [facility], [address], [place], [place] [address]    Report Status PENDING  Incomplete  Culture, blood (x 2)     Status: None (Preliminary result)   Collection Time: 12/20/21 11:50 PM   Specimen: BLOOD  Result Value Ref Range Status   Specimen Description   Final    BLOOD BLOOD LEFT HAND Performed at [facility], [address] [place], [place] [address]    Special Requests   Final    BOTTLES DRAWN AEROBIC AND ANAEROBIC Blood Culture results may not be optimal due to an inadequate volume of blood received in culture bottles Performed at [facility], [address] [place], [place] [address]    Culture   Final    NO GROWTH 2 DAYS Performed at [facility], [address], [place], [place] [address]    Report Status PENDING  Incomplete  C Difficile Quick Screen w PCR reflex  Status: None   Collection Time: 12/22/21 10:46 AM   Specimen: STOOL  Result Value Ref Range Status   C Diff antigen NEGATIVE NEGATIVE Final   C Diff toxin NEGATIVE NEGATIVE Final   C Diff interpretation No C. difficile detected.  Final    Comment: Performed at [facility], [address] [place], [place] [address]  Gastrointestinal Panel by PCR , Stool     Status: None   Collection Time: 12/22/21 10:46 AM   Specimen: STOOL  Result Value Ref Range Status   Campylobacter species NOT DETECTED NOT DETECTED Final   Plesimonas shigelloides NOT DETECTED NOT DETECTED Final   Salmonella species NOT DETECTED NOT DETECTED Final   Yersinia enterocolitica NOT DETECTED NOT DETECTED Final   Vibrio species NOT DETECTED NOT DETECTED Final   Vibrio cholerae NOT DETECTED NOT DETECTED Final   Enteroaggregative E coli (EAEC) NOT DETECTED NOT DETECTED Final   Enteropathogenic E coli (EPEC) NOT DETECTED NOT DETECTED Final   Enterotoxigenic E coli (ETEC) NOT DETECTED NOT DETECTED Final   Shiga like toxin producing E coli (STEC) NOT DETECTED NOT DETECTED Final   Shigella/Enteroinvasive E coli (EIEC) NOT DETECTED NOT DETECTED Final   Cryptosporidium NOT DETECTED NOT DETECTED Final   Cyclospora cayetanensis NOT DETECTED NOT DETECTED Final   Entamoeba histolytica NOT DETECTED NOT DETECTED Final   Giardia lamblia NOT DETECTED NOT DETECTED Final   Adenovirus F40/41 NOT DETECTED NOT  DETECTED Final   Astrovirus NOT DETECTED NOT DETECTED Final   Norovirus GI/GII NOT DETECTED NOT DETECTED Final   Rotavirus A NOT DETECTED NOT DETECTED Final   Sapovirus (I, II, IV, and V) NOT DETECTED NOT DETECTED Final    Comment: Performed at [facility], [address] [place], [place] [address]         Radiology Studies: No results found.      Scheduled Meds:  amLODipine  5 mg Oral Daily   aspirin  81 mg Oral Daily   enoxaparin (LOVENOX) injection  40 mg Subcutaneous Q24H   fluticasone  1 spray Each Nare Daily   insulin aspart  0-5 Units Subcutaneous QHS   insulin aspart  0-9 Units Subcutaneous TID WC   Ipratropium-Albuterol  1 puff Inhalation BID   levothyroxine  125 mcg Oral QAC breakfast   metoprolol succinate  50 mg Oral Daily   montelukast  10 mg Oral QHS   nirmatrelvir/ritonavir EUA (renal dosing)  2 tablet Oral BID   pantoprazole  40 mg Oral Daily   timolol  1 drop Both Eyes BID   Continuous Infusions:     LOS: 2 days   Time spent= 35 mins    Martino Tompson Joline Maxcyhirag Lashea Goda, MD [facility]  If 7PM-7AM, please contact night-coverage  12/23/2021, 12:04 PM

## 2021-12-23 NOTE — TOC Progression Note (Addendum)
Transition of Care [facility]) - Progression Note    Patient Details  Name: Mary [name] MRN: [id number] Date of Birth: [birth year]  [facility]) CM/SW Contact  Dwyane Dupree, Olegario Messier, RN Phone Number: 12/23/2021, 10:32 AM  Clinical Narrative:  Await to discuss with nurse on call [facility]. Left vm w/dtr [name] await call back. COVID isolation ends 12/16.  -Spoke to dtr [name]-feels home is unsafe for d/c, prefer [facility] to fax out tomorrow for [facility] today.    Expected Discharge Plan: Skilled Nursing Facility Barriers to Discharge: SNF Covid  Expected Discharge Plan and Services Expected Discharge Plan: Skilled Nursing Facility In-house Referral: NA Discharge Planning Services: CM Consult Post Acute Care Choice: Skilled Nursing Facility Living arrangements for the past 2 months: Single Family Home                 DME Arranged: N/A DME Agency: NA                   Social Determinants of Health (SDOH) Interventions Food Insecurity Interventions: Intervention Not Indicated Housing Interventions: Intervention Not Indicated Utilities Interventions: Intervention Not Indicated  Readmission Risk Interventions    12/21/2021    3:27 PM 12/21/2021   12:27 PM  Readmission Risk Prevention Plan  Post Dischage Appt Complete Complete  Medication Screening Complete Complete  Transportation Screening Complete Complete

## 2021-12-24 DIAGNOSIS — U071 COVID-19: Secondary | ICD-10-CM | POA: Diagnosis not present

## 2021-12-24 LAB — BASIC METABOLIC PANEL
Anion gap: 7 (ref 5–15)
BUN: 16 mg/dL (ref 8–23)
CO2: 23 mmol/L (ref 22–32)
Calcium: 8.5 mg/dL — ABNORMAL LOW (ref 8.9–10.3)
Chloride: 108 mmol/L (ref 98–111)
Creatinine, Ser: 0.98 mg/dL (ref 0.44–1.00)
GFR, Estimated: >60 mL/min (ref >60)
Glucose, Bld: 84 mg/dL (ref 70–99)
Potassium: 3.9 mmol/L (ref 3.5–5.1)
Sodium: 138 mmol/L (ref 135–145)

## 2021-12-24 LAB — CBC
HCT: 24.6 % — ABNORMAL LOW (ref 36.0–46.0)
Hemoglobin: 7.7 g/dL — ABNORMAL LOW (ref 12.0–15.0)
MCH: 29.3 pg (ref 26.0–34.0)
MCHC: 31.3 g/dL (ref 30.0–36.0)
MCV: 93.5 fL (ref 80.0–100.0)
Platelets: 290 10*3/uL (ref 150–400)
RBC: 2.63 MIL/uL — ABNORMAL LOW (ref 3.87–5.11)
RDW: 14 % (ref 11.5–15.5)
WBC: 8.7 10*3/uL (ref 4.0–10.5)
nRBC: 0 % (ref 0.0–0.2)

## 2021-12-24 LAB — GLUCOSE, CAPILLARY
Glucose-Capillary: 80 mg/dL (ref 70–99)
Glucose-Capillary: 113 mg/dL — ABNORMAL HIGH (ref 70–99)
Glucose-Capillary: 136 mg/dL — ABNORMAL HIGH (ref 70–99)
Glucose-Capillary: 128 mg/dL — ABNORMAL HIGH (ref 70–99)

## 2021-12-24 LAB — MAGNESIUM: Magnesium: 1.9 mg/dL (ref 1.7–2.4)

## 2021-12-24 MED ORDER — NIRMATRELVIR/RITONAVIR (PAXLOVID) TABLET (RENAL DOSING): 2.0000 | ORAL_TABLET | Freq: Two times a day (BID) | ORAL | 0 refills | Status: AC

## 2021-12-24 MED ORDER — IPRATROPIUM-ALBUTEROL 20-100 MCG/ACT IN AERS: 1.0000 | INHALATION_SPRAY | Freq: Two times a day (BID) | RESPIRATORY_TRACT | 0 refills | Status: DC

## 2021-12-24 MED ORDER — AMLODIPINE BESYLATE 5 MG PO TABS: 5.0000 mg | ORAL_TABLET | Freq: Every day | ORAL | 0 refills | Status: DC

## 2021-12-24 MED ORDER — LORATADINE 10 MG PO TABS
10.0000 mg | ORAL_TABLET | Freq: Every day | ORAL | Status: DC
  Administered 2021-12-24 – 2021-12-30 (×7): 10 mg via ORAL
  Filled 2021-12-24 (×7): qty 1

## 2021-12-24 NOTE — TOC Progression Note (Signed)
Transition of Care [facility]) - Progression Note    Patient Details  Name: Mary [name] MRN: [id number] Date of Birth: 1945/03/16  Transition of Care [facility]) CM/SW Contact  Otelia Santee, LCSW Phone Number: 12/24/2021, 2:41 PM  Clinical Narrative:    CSW spoke with Marchelle Folks at [facility] who shares that this pt has been approved by their MD for SNF.  CSW spoke with pt's daughter who shares that pt is still agreeable to go to SNF as long as she does not have to go to [facility]. CSW will send referrals for SNF placement closer to the end of pt's isolation period.    Expected Discharge Plan: Skilled Nursing Facility Barriers to Discharge: SNF Covid  Expected Discharge Plan and Services Expected Discharge Plan: Skilled Nursing Facility In-house Referral: NA Discharge Planning Services: CM Consult Post Acute Care Choice: Skilled Nursing Facility Living arrangements for the past 2 months: Single Family Home                 DME Arranged: N/A DME Agency: NA                   Social Determinants of Health (SDOH) Interventions Food Insecurity Interventions: Intervention Not Indicated Housing Interventions: Intervention Not Indicated Utilities Interventions: Intervention Not Indicated  Readmission Risk Interventions    12/21/2021    3:27 PM 12/21/2021   12:27 PM  Readmission Risk Prevention Plan  Post Dischage Appt Complete Complete  Medication Screening Complete Complete  Transportation Screening Complete Complete

## 2021-12-24 NOTE — Progress Notes (Signed)
PROGRESS NOTE    Mary [name]  ZOX:[id number] DOB: 09/12/1945 DOA: 12/19/2021 PCP: Angela Coxasanayaka, Gayani Y, MD   Brief Narrative:   76 y.o. female with medical history significant of type 2 diabetes, hypertension, diabetic and hypertensive retinopathy, glaucoma, central retinal vein occlusion, hyperlipidemia, hypothyroidism, GERD, substance abuse presented to the ED with complaints of diarrhea, abdominal pain, URI symptoms, and generalized weakness.  Patient was diagnosed with COVID-19 with mild URI and GI symptoms.  Also elevated troponin likely demand ischemia.  During the hospitalization patient was started on 5 days of molnupiravir, supportive care and IV fluids.  PT/OT recommended SNF, TOC working on placement at this time.  Patient is very motivated to work with therapy, if she continues to improve.  She is agreeable to go home instead of waiting.   Assessment & Plan:  Principal Problem:   COVID-19 virus infection Active Problems:   Type 2 diabetes mellitus (HCC)   Diarrhea   Chronic anemia   Elevated troponin   Elevated CK   AKI (acute kidney injury) (HCC)   Serum total bilirubin elevated     COVID-19 viral infection -Active COVID-19 infection with mild GI and URI symptoms.  Chest x-ray is negative.  5 days of molnupiravir without any indication of steroids.  Continue airborne precaution, supplemental oxygen and supportive care  Fever - Secondary to COVID-19, supportive care.  Antipyretics as needed.   Elevated troponin Troponin elevated but stable (382> 318) and not consistent with ACS.  Patient is without chest pain.  Continue cardiac monitoring.  Echocardiogram shows EF of 55%, left ventricular hypokinesia and grade 2 diastolic dysfunction.  Would recommend outpatient follow-up with cardiology.   Diarrhea, improved This has resolved.  Nursing staff unable to get sample for GI panel and C. difficile.   Elevated CK, rhabdomyolysis, improving Patient received gentle  hydration.  Statin on hold  Essential hypertension, uncontrolled - Add Norvasc 5 mg p.o. daily and as needed hydralazine/Lopressor ordered On home Toprol-XL 50 mg daily   Type 2 diabetes Currently on sliding scale and Accu-Chek.  Blood sugars running borderline low therefore we will liberalize her diet   Chronic normocytic anemia Hemoglobin is stable     Mild AKI Baseline creatinine 1.0.  Admission creatinine 1.1.  Getting IV fluids.   Elevated T. Bili T. bili 1.8 but remainder of LFTs normal. -Fractionated bilirubin levels   Hyperlipidemia -Hold statin given elevated CK   Hypothyroidism-Synthroid GERD Bilateral lower extremity chronic lymphedema-has been following outpatient lymphedema clinic.  She stopped using her lymphedema pump couple of weeks ago         DVT prophylaxis: Lovenox Code Status: Full code Family Communication:  Daughter updated.   Status is: Inpatient Overall patient is medically stable, per TOC and skilled nursing facility patient needs to be in isolation for total of 10 days.  She was diagnosed of COVID-19 on 12/19/2021.  Will possibly go home if she continues to make improvement with maximum home health  Body mass index is 49.72 kg/m.         Subjective:  Feels ok no new complaints.   Examination: Constitutional: Not in acute distress Respiratory: Clear to auscultation bilaterally Cardiovascular: Normal sinus rhythm, no rubs Abdomen: Nontender nondistended good bowel sounds Musculoskeletal: No edema noted Skin: No rashes seen Neurologic: CN 2-12 grossly intact.  And nonfocal Psychiatric: Normal judgment and insight. Alert and oriented x 3. Normal mood.   Objective: Vitals:   12/23/21 0500 12/23/21 1423 12/23/21 2009 12/24/21 0429  BP: [name](!)  150/63 (!) 158/64 (!) 139/56 (!) 133/53  Pulse: 84 81 79 74  Resp: 19 18 20 16   Temp: 99.3 F (37.4 C) 99.6 F (37.6 C) 98 F (36.7 C) 99.4 F (37.4 C)  TempSrc: Oral Oral  Oral  SpO2:  95% 94% 94% 100%  Weight:      Height:        Intake/Output Summary (Last 24 hours) at 12/24/2021 1241 Last data filed at 12/24/2021 0752 Gross per 24 hour  Intake --  Output 750 ml  Net -750 ml   Filed Weights   12/21/21 1101  Weight: (!) 144 kg     Data Reviewed:   CBC: Recent Labs  Lab 12/19/21 2215 12/20/21 0609 12/20/21 2350 12/21/21 0617 12/22/21 0633 12/23/21 0543 12/24/21 0538  WBC 9.2   < > 7.6 7.8 8.9 9.0 8.7  NEUTROABS 6.8  --  5.9  --   --   --   --   HGB 9.3*   < > 8.8* 8.1* 8.1* 7.9* 7.7*  HCT 29.3*   < > 27.6* 25.5* 25.9* 24.4* 24.6*  MCV 94.2   < > 94.8 96.2 97.0 92.8 93.5  PLT 213   < > 194 191 230 262 290   < > = values in this interval not displayed.   Basic Metabolic Panel: Recent Labs  Lab 12/20/21 2350 12/21/21 0617 12/22/21 0633 12/23/21 0543 12/24/21 0538  NA 137 135 137 137 138  K 3.5 3.2* 3.9 3.8 3.9  CL 107 106 109 107 108  CO2 20* 21* 20* 23 23  GLUCOSE 91 109* 81 74 84  BUN 17 16 16 15 16   CREATININE 1.16* 1.14* 1.03* 0.96 0.98  CALCIUM 8.4* 8.0* 8.4* 8.5* 8.5*  MG  --  1.6* 1.9 1.8 1.9   GFR: Estimated Creatinine Clearance: 75.2 mL/min (by C-G formula based on SCr of 0.98 mg/dL). Liver Function Tests: Recent Labs  Lab 12/19/21 2215 12/20/21 0609 12/20/21 2350  AST 30  --  23  ALT 12  --  12  ALKPHOS 83  --  71  BILITOT 1.8* 1.2 1.6*  PROT 8.0  --  6.7  ALBUMIN 3.5  --  2.9*   No results for input(s): "LIPASE", "AMYLASE" in the last 168 hours. No results for input(s): "AMMONIA" in the last 168 hours. Coagulation Profile: No results for input(s): "INR", "PROTIME" in the last 168 hours. Cardiac Enzymes: Recent Labs  Lab 12/20/21 0020 12/20/21 0609  CKTOTAL 551* 489*   BNP (last 3 results) No results for input(s): "PROBNP" in the last 8760 hours. HbA1C: No results for input(s): "HGBA1C" in the last 72 hours.  CBG: Recent Labs  Lab 12/23/21 1208 12/23/21 1734 12/23/21 2109 12/24/21 0750  12/24/21 1149  GLUCAP 82 101* 96 80 113*   Lipid Profile: No results for input(s): "CHOL", "HDL", "LDLCALC", "TRIG", "CHOLHDL", "LDLDIRECT" in the last 72 hours. Thyroid Function Tests: No results for input(s): "TSH", "T4TOTAL", "FREET4", "T3FREE", "THYROIDAB" in the last 72 hours. Anemia Panel: No results for input(s): "VITAMINB12", "FOLATE", "FERRITIN", "TIBC", "IRON", "RETICCTPCT" in the last 72 hours.  Sepsis Labs: Recent Labs  Lab 12/20/21 2350  PROCALCITON 0.41  LATICACIDVEN 1.3    Recent Results (from the past 240 hour(s))  Resp Panel by RT-PCR (Flu A&B, Covid) Anterior Nasal Swab     Status: Abnormal   Collection Time: 12/19/21 10:15 PM   Specimen: Anterior Nasal Swab  Result Value Ref Range Status   SARS Coronavirus 2 by RT  PCR POSITIVE (A) NEGATIVE Final    Comment: (NOTE) SARS-CoV-2 target nucleic acids are DETECTED.  The SARS-CoV-2 RNA is generally detectable in upper respiratory specimens during the acute phase of infection. Positive results are indicative of the presence of the identified virus, but do not rule out bacterial infection or co-infection with other pathogens not detected by the test. Clinical correlation with patient history and other diagnostic information is necessary to determine patient infection status. The expected result is Negative.  Fact Sheet for Patients: BloggerCourse.com  Fact Sheet for Healthcare Providers: SeriousBroker.it  This test is not yet approved or cleared by the [place] FDA and  has been authorized for detection and/or diagnosis of SARS-CoV-2 by FDA under an Emergency Use Authorization (EUA).  This EUA will remain in effect (meaning this test can be used) for the duration of  the COVID-19 declaration under Section 564(b)(1) of the A ct, 21 U.S.C. section 360bbb-3(b)(1), unless the authorization is terminated or revoked sooner.     Influenza A by PCR NEGATIVE  NEGATIVE Final   Influenza B by PCR NEGATIVE NEGATIVE Final    Comment: (NOTE) The Xpert Xpress SARS-CoV-2/FLU/RSV plus assay is intended as an aid in the diagnosis of influenza from Nasopharyngeal swab specimens and should not be used as a sole basis for treatment. Nasal washings and aspirates are unacceptable for Xpert Xpress SARS-CoV-2/FLU/RSV testing.  Fact Sheet for Patients: BloggerCourse.com  Fact Sheet for Healthcare Providers: SeriousBroker.it  This test is not yet approved or cleared by the [place] FDA and has been authorized for detection and/or diagnosis of SARS-CoV-2 by FDA under an Emergency Use Authorization (EUA). This EUA will remain in effect (meaning this test can be used) for the duration of the COVID-19 declaration under Section 564(b)(1) of the Act, 21 U.S.C. section 360bbb-3(b)(1), unless the authorization is terminated or revoked.  Performed at [facility], [address] [place], [place] [address]   Culture, blood (x 2)     Status: None (Preliminary result)   Collection Time: 12/20/21 11:50 PM   Specimen: BLOOD  Result Value Ref Range Status   Specimen Description   Final    BLOOD BLOOD RIGHT HAND Performed at [facility], [address] [place], [place] [address]    Special Requests   Final    BOTTLES DRAWN AEROBIC AND ANAEROBIC Blood Culture results may not be optimal due to an inadequate volume of blood received in culture bottles Performed at [facility], [address], [place], [place] [address]    Culture   Final    NO GROWTH 3 DAYS Performed at [facility], [address] [place], [place] [address]    Report Status PENDING  Incomplete  Culture, blood (x 2)     Status: None (Preliminary result)   Collection Time: 12/20/21 11:50 PM   Specimen: BLOOD  Result Value Ref Range Status   Specimen Description   Final     BLOOD BLOOD LEFT HAND Performed at [facility], [address], [place], [place] [address]    Special Requests   Final    BOTTLES DRAWN AEROBIC AND ANAEROBIC Blood Culture results may not be optimal due to an inadequate volume of blood received in culture bottles Performed at [facility], [address], [place], [place] [address]    Culture   Final    NO GROWTH 3 DAYS Performed at [facility], [address],  [place], [place] [address]    Report Status PENDING  Incomplete  C Difficile Quick Screen w PCR reflex     Status: None   Collection Time: 12/22/21 10:46 AM   Specimen: STOOL  Result Value Ref Range Status   C Diff antigen NEGATIVE NEGATIVE Final   C Diff toxin NEGATIVE NEGATIVE Final   C Diff interpretation No C. difficile detected.  Final    Comment: Performed at [facility], [address] [place], [place] [address]  Gastrointestinal Panel by PCR , Stool     Status: None   Collection Time: 12/22/21 10:46 AM   Specimen: STOOL  Result Value Ref Range Status   Campylobacter species NOT DETECTED NOT DETECTED Final   Plesimonas shigelloides NOT DETECTED NOT DETECTED Final   Salmonella species NOT DETECTED NOT DETECTED Final   Yersinia enterocolitica NOT DETECTED NOT DETECTED Final   Vibrio species NOT DETECTED NOT DETECTED Final   Vibrio cholerae NOT DETECTED NOT DETECTED Final   Enteroaggregative E coli (EAEC) NOT DETECTED NOT DETECTED Final   Enteropathogenic E coli (EPEC) NOT DETECTED NOT DETECTED Final   Enterotoxigenic E coli (ETEC) NOT DETECTED NOT DETECTED Final   Shiga like toxin producing E coli (STEC) NOT DETECTED NOT DETECTED Final   Shigella/Enteroinvasive E coli (EIEC) NOT DETECTED NOT DETECTED Final   Cryptosporidium NOT DETECTED NOT DETECTED Final   Cyclospora cayetanensis NOT DETECTED NOT DETECTED Final   Entamoeba histolytica NOT DETECTED NOT DETECTED Final   Giardia lamblia NOT DETECTED NOT  DETECTED Final   Adenovirus F40/41 NOT DETECTED NOT DETECTED Final   Astrovirus NOT DETECTED NOT DETECTED Final   Norovirus GI/GII NOT DETECTED NOT DETECTED Final   Rotavirus A NOT DETECTED NOT DETECTED Final   Sapovirus (I, II, IV, and V) NOT DETECTED NOT DETECTED Final    Comment: Performed at [facility], [address] [place], [place] [address]         Radiology Studies: No results found.      Scheduled Meds:  amLODipine  5 mg Oral Daily   aspirin  81 mg Oral Daily   enoxaparin (LOVENOX) injection  40 mg Subcutaneous Q24H   fluticasone  1 spray Each Nare Daily   insulin aspart  0-5 Units Subcutaneous QHS   insulin aspart  0-9 Units Subcutaneous TID WC   Ipratropium-Albuterol  1 puff Inhalation BID   levothyroxine  125 mcg Oral QAC breakfast   loratadine  10 mg Oral Daily   metoprolol succinate  50 mg Oral Daily   montelukast  10 mg Oral QHS   nirmatrelvir/ritonavir EUA (renal dosing)  2 tablet Oral BID   pantoprazole  40 mg Oral Daily   timolol  1 drop Both Eyes BID   Continuous Infusions:     LOS: 3 days   Time spent= 35 mins    Deidrea Gaetz Joline Maxcy, MD [facility]  If 7PM-7AM, please contact night-coverage  12/24/2021, 12:41 PM

## 2021-12-24 NOTE — [place] FL2 (Signed)
[place] MEDICAID FL2 LEVEL OF CARE FORM     IDENTIFICATION  Patient Name: Mary BRENNER Birthdate: 1945-04-11 Sex: female Admission Date (Current Location): 12/19/2021  [facility] and [place] Number:  Producer, television/film/video and Address:  [facility],  [address] [place], [place] [address]      Provider Number: [id number]  Attending Physician Name and Address:  Dimple Nanas, MD  Relative Name and Phone Number:  Daughter, Saleha Kalp [phone]    Current Level of Care: Hospital Recommended Level of Care: Skilled Nursing Facility Prior Approval Number:    Date Approved/Denied:   PASRR Number: [id number]  Discharge Plan: SNF    Current Diagnoses: Patient Active Problem List   Diagnosis Date Noted   COVID-19 virus infection 12/20/2021   Elevated troponin 12/20/2021   Elevated CK 12/20/2021   AKI (acute kidney injury) (HCC) 12/20/2021   Serum total bilirubin elevated 12/20/2021   Lymphedema 08/10/2021   Diarrhea 01/20/2020   Chronic anemia 01/20/2020   Colon cancer screening 01/20/2020   Type 2 diabetes mellitus (HCC) 02/21/2018   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   Primary osteoarthritis of both knees 02/21/2018   Class 3 severe obesity due to excess calories with serious comorbidity and body mass index (BMI) of 45.0 to 49.9 in adult [facility]) 02/21/2018   Skin ulcer of right foot with fat layer exposed (HCC) 02/18/2018   Morbid (severe) obesity due to excess calories (HCC) 11/18/2017   Knee pain 07/16/2015   Lateral dislocation of right patella 05/04/2015   Rupture of right quadriceps tendon 05/24/2014   S/P TKR (total knee replacement) 05/05/2012    Orientation RESPIRATION BLADDER Height & Weight     Self, Time, Situation, Place  Normal Incontinent, External catheter Weight: (!) 317 lb 7.4 oz (144 kg) Height:  5\' 7"  (170.2 cm)  BEHAVIORAL SYMPTOMS/MOOD NEUROLOGICAL BOWEL NUTRITION STATUS      Incontinent Diet (Regular)  AMBULATORY  STATUS COMMUNICATION OF NEEDS Skin   Extensive Assist Verbally Normal                       Personal Care Assistance Level of Assistance  Bathing, Feeding, Dressing Bathing Assistance: Maximum assistance Feeding assistance: Independent Dressing Assistance: Maximum assistance     Functional Limitations Info  Hearing, Sight, Speech Sight Info: Adequate Hearing Info: Adequate Speech Info: Adequate    SPECIAL CARE FACTORS FREQUENCY  PT (By licensed PT), OT (By licensed OT)     PT Frequency: 5x/wk OT Frequency: 5x/wk            Contractures Contractures Info: Not present    Additional Factors Info  Code Status, Allergies Code Status Info: FULL Allergies Info: No Known Allergies           Current Medications (12/24/2021):  This is the current hospital active medication list Current Facility-Administered Medications  Medication Dose Route Frequency Provider Last Rate Last Admin   acetaminophen (TYLENOL) tablet 650 mg  650 mg Oral Q4H PRN 14/11/2021, NP   650 mg at 12/21/21 1822   amLODipine (NORVASC) tablet 5 mg  5 mg Oral Daily Amin, Ankit Chirag, MD   5 mg at 12/24/21 14/11/23   aspirin chewable tablet 81 mg  81 mg Oral Daily Amin, Ankit Chirag, MD   81 mg at 12/24/21 0951   enoxaparin (LOVENOX) injection 40 mg  40 mg Subcutaneous Q24H 14/11/23, MD   40 mg at 12/24/21 0951   fluticasone (FLONASE) 50 MCG/ACT nasal  spray 1 spray  1 spray Each Nare Daily Anthoney Harada, NP   1 spray at 12/24/21 0952   guaiFENesin (ROBITUSSIN) 100 MG/5ML liquid 5 mL  5 mL Oral Q4H PRN Amin, Ankit Chirag, MD       hydrALAZINE (APRESOLINE) injection 10 mg  10 mg Intravenous Q4H PRN Amin, Ankit Chirag, MD   10 mg at 12/22/21 2103   insulin aspart (novoLOG) injection 0-5 Units  0-5 Units Subcutaneous QHS John Giovanni, MD       insulin aspart (novoLOG) injection 0-9 Units  0-9 Units Subcutaneous TID WC John Giovanni, MD       Ipratropium-Albuterol (COMBIVENT) respimat  1 puff  1 puff Inhalation BID Dimple Nanas, MD   1 puff at 12/24/21 0952   levothyroxine (SYNTHROID) tablet 125 mcg  125 mcg Oral QAC breakfast Amin, Ankit Chirag, MD   125 mcg at 12/24/21 0659   loratadine (CLARITIN) tablet 10 mg  10 mg Oral Daily Amin, Ankit Chirag, MD   10 mg at 12/24/21 1430   metoprolol succinate (TOPROL-XL) 24 hr tablet 50 mg  50 mg Oral Daily Amin, Ankit Chirag, MD   50 mg at 12/24/21 0951   metoprolol tartrate (LOPRESSOR) injection 5 mg  5 mg Intravenous Q4H PRN Amin, Ankit Chirag, MD   5 mg at 12/20/21 2033   montelukast (SINGULAIR) tablet 10 mg  10 mg Oral QHS Amin, Ankit Chirag, MD   10 mg at 12/23/21 2155   nirmatrelvir/ritonavir EUA (renal dosing) (PAXLOVID) 2 tablet  2 tablet Oral BID John Giovanni, MD   2 tablet at 12/24/21 0953   ondansetron (ZOFRAN) injection 4 mg  4 mg Intravenous Q6H PRN Amin, Ankit Chirag, MD       oxyCODONE (Oxy IR/ROXICODONE) immediate release tablet 5 mg  5 mg Oral Q4H PRN Amin, Ankit Chirag, MD   5 mg at 12/24/21 0951   pantoprazole (PROTONIX) EC tablet 40 mg  40 mg Oral Daily Amin, Ankit Chirag, MD   40 mg at 12/24/21 0951   sodium chloride (OCEAN) 0.65 % nasal spray 1 spray  1 spray Each Nare PRN Anthoney Harada, NP   1 spray at 12/21/21 0001   timolol (TIMOPTIC) 0.25 % ophthalmic solution 1 drop  1 drop Both Eyes BID Amin, Ankit Chirag, MD   1 drop at 12/24/21 0953   traZODone (DESYREL) tablet 50 mg  50 mg Oral QHS PRN Dimple Nanas, MD   50 mg at 12/23/21 2155     Discharge Medications: Please see discharge summary for a list of discharge medications.  Relevant Imaging Results:  Relevant Lab Results:   Additional Information SS # [id number]  Otelia Santee, LCSW

## 2021-12-24 NOTE — Care Management Important Message (Signed)
Important Message  Patient Details IM Letter given. Name: Mary [name] MRN: [id number] Date of Birth: Jan 15, 1946   Medicare Important Message Given:  Yes     Caren Macadam 12/24/2021, 11:55 AM

## 2021-12-25 DIAGNOSIS — U071 COVID-19: Secondary | ICD-10-CM | POA: Diagnosis not present

## 2021-12-25 LAB — GLUCOSE, CAPILLARY
Glucose-Capillary: 83 mg/dL (ref 70–99)
Glucose-Capillary: 129 mg/dL — ABNORMAL HIGH (ref 70–99)
Glucose-Capillary: 108 mg/dL — ABNORMAL HIGH (ref 70–99)
Glucose-Capillary: 116 mg/dL — ABNORMAL HIGH (ref 70–99)

## 2021-12-25 MED ORDER — DM-GUAIFENESIN ER 30-600 MG PO TB12
1.0000 | ORAL_TABLET | Freq: Two times a day (BID) | ORAL | Status: DC
  Administered 2021-12-25 – 2021-12-30 (×11): 1 via ORAL
  Filled 2021-12-25 (×11): qty 1

## 2021-12-25 NOTE — Progress Notes (Signed)
PROGRESS NOTE    Mary [name]  [id number] DOB: Sep 29, 1945 DOA: 12/19/2021 PCP: Merlene Laughter, MD   Brief Narrative:   76 y.o. female with medical history significant of type 2 diabetes, hypertension, diabetic and hypertensive retinopathy, glaucoma, central retinal vein occlusion, hyperlipidemia, hypothyroidism, GERD, substance abuse presented to the ED with complaints of diarrhea, abdominal pain, URI symptoms, and generalized weakness.  Patient was diagnosed with COVID-19 with mild URI and GI symptoms.  Also elevated troponin likely demand ischemia.  During the hospitalization patient was started on 5 days of molnupiravir, supportive care and IV fluids.  PT/OT recommended SNF, TOC working on placement at this time.  Patient is very motivated to work with therapy, if she continues to improve.  Awaiting SNF placement   Assessment & Plan:  Principal Problem:   COVID-19 virus infection Active Problems:   Type 2 diabetes mellitus (HCC)   Diarrhea   Chronic anemia   Elevated troponin   Elevated CK   AKI (acute kidney injury) (HCC)   Serum total bilirubin elevated     COVID-19 viral infection, very mild URI symptoms -Active COVID-19 infection with mild GI and URI symptoms.  Chest x-ray is negative.  5 days of molnupiravir without any indication of steroids.  Continue airborne precaution, supplemental oxygen and supportive care  Fever - Not remains afebrile   Elevated troponin Troponin elevated but stable (382> 318) and not consistent with ACS.  Patient is without chest pain.  Continue cardiac monitoring.  Echocardiogram shows EF of 55%, left ventricular hypokinesia and grade 2 diastolic dysfunction.  Would recommend outpatient follow-up with cardiology.   Diarrhea, improved Resolved, unable to get any stool samples for further testing   Elevated CK, rhabdomyolysis, improving Patient received gentle hydration.  Statin on hold  Essential hypertension, uncontrolled -  Added 5 mg of p.o. Norvasc during this admission and as needed hydralazine/Lopressor ordered On home Toprol-XL 50 mg daily   Type 2 diabetes, blood sugar in acceptable range Currently on sliding scale and Accu-Chek.  Blood sugars running borderline low therefore we will liberalize her diet   Chronic normocytic anemia Hemoglobin is stable     Elevated T. Bili T. bili 1.8 but remainder of LFTs normal. -Fractionated bilirubin levels   Hyperlipidemia -Hold statin given elevated CK   Hypothyroidism-Synthroid GERD Bilateral lower extremity chronic lymphedema-has been following outpatient lymphedema clinic.  She stopped using her lymphedema pump couple of weeks ago         DVT prophylaxis: Lovenox Code Status: Full code Family Communication:  Daughter updated yesterday  Status is: Inpatient Overall patient is medically stable, per TOC and skilled nursing facility patient needs to be in isolation for total of 10 days.  She was diagnosed of COVID-19 on 12/19/2021.  Planning for SNF  Body mass index is 49.72 kg/m.         Subjective: Seen and examined at bedside.  Reporting of some head congestion and requesting Mucinex.  Otherwise no other complaints, agreeable to SNF   Examination: Constitutional: Not in acute distress, obese, very pleasant Respiratory: Minimal bibasilar rhonchi Cardiovascular: Normal sinus rhythm, no rubs Abdomen: Nontender nondistended good bowel sounds Musculoskeletal: No edema noted Skin: No rashes seen Neurologic: CN 2-12 grossly intact.  And nonfocal Psychiatric: Normal judgment and insight. Alert and oriented x 3. Normal mood.  Objective: Vitals:   12/24/21 0429 12/24/21 1433 12/24/21 1928 12/25/21 0517  BP: (!) 133/53 124/60 126/81 137/63  Pulse: 74 82 83 75  Resp: 16  18 19  Temp: 99.4 F (37.4 C) 98.8 F (37.1 C) 97.9 F (36.6 C) 97.8 F (36.6 C)  TempSrc: Oral Oral Oral Oral  SpO2: 100% 100% 100% 96%  Weight:      Height:         Intake/Output Summary (Last 24 hours) at 12/25/2021 0742 Last data filed at 12/25/2021 0500 Gross per 24 hour  Intake --  Output 1400 ml  Net -1400 ml   Filed Weights   12/21/21 1101  Weight: (!) 144 kg     Data Reviewed:   CBC: Recent Labs  Lab 12/19/21 2215 12/20/21 0609 12/20/21 2350 12/21/21 0617 12/22/21 0633 12/23/21 0543 12/24/21 0538  WBC 9.2   < > 7.6 7.8 8.9 9.0 8.7  NEUTROABS 6.8  --  5.9  --   --   --   --   HGB 9.3*   < > 8.8* 8.1* 8.1* 7.9* 7.7*  HCT 29.3*   < > 27.6* 25.5* 25.9* 24.4* 24.6*  MCV 94.2   < > 94.8 96.2 97.0 92.8 93.5  PLT 213   < > 194 191 230 262 290   < > = values in this interval not displayed.   Basic Metabolic Panel: Recent Labs  Lab 12/20/21 2350 12/21/21 0617 12/22/21 0633 12/23/21 0543 12/24/21 0538  NA 137 135 137 137 138  K 3.5 3.2* 3.9 3.8 3.9  CL 107 106 109 107 108  CO2 20* 21* 20* 23 23  GLUCOSE 91 109* 81 74 84  BUN 17 16 16 15 16   CREATININE 1.16* 1.14* 1.03* 0.96 0.98  CALCIUM 8.4* 8.0* 8.4* 8.5* 8.5*  MG  --  1.6* 1.9 1.8 1.9   GFR: Estimated Creatinine Clearance: 75.2 mL/min (by C-G formula based on SCr of 0.98 mg/dL). Liver Function Tests: Recent Labs  Lab 12/19/21 2215 12/20/21 0609 12/20/21 2350  AST 30  --  23  ALT 12  --  12  ALKPHOS 83  --  71  BILITOT 1.8* 1.2 1.6*  PROT 8.0  --  6.7  ALBUMIN 3.5  --  2.9*   No results for input(s): "LIPASE", "AMYLASE" in the last 168 hours. No results for input(s): "AMMONIA" in the last 168 hours. Coagulation Profile: No results for input(s): "INR", "PROTIME" in the last 168 hours. Cardiac Enzymes: Recent Labs  Lab 12/20/21 0020 12/20/21 0609  CKTOTAL 551* 489*   BNP (last 3 results) No results for input(s): "PROBNP" in the last 8760 hours. HbA1C: No results for input(s): "HGBA1C" in the last 72 hours.  CBG: Recent Labs  Lab 12/23/21 2109 12/24/21 0750 12/24/21 1149 12/24/21 1623 12/24/21 2112  GLUCAP 96 80 113* 136* 128*   Lipid  Profile: No results for input(s): "CHOL", "HDL", "LDLCALC", "TRIG", "CHOLHDL", "LDLDIRECT" in the last 72 hours. Thyroid Function Tests: No results for input(s): "TSH", "T4TOTAL", "FREET4", "T3FREE", "THYROIDAB" in the last 72 hours. Anemia Panel: No results for input(s): "VITAMINB12", "FOLATE", "FERRITIN", "TIBC", "IRON", "RETICCTPCT" in the last 72 hours.  Sepsis Labs: Recent Labs  Lab 12/20/21 2350  PROCALCITON 0.41  LATICACIDVEN 1.3    Recent Results (from the past 240 hour(s))  Resp Panel by RT-PCR (Flu A&B, Covid) Anterior Nasal Swab     Status: Abnormal   Collection Time: 12/19/21 10:15 PM   Specimen: Anterior Nasal Swab  Result Value Ref Range Status   SARS Coronavirus 2 by RT PCR POSITIVE (A) NEGATIVE Final    Comment: (NOTE) SARS-CoV-2 target nucleic acids are DETECTED.  The SARS-CoV-2 RNA is generally detectable in upper respiratory specimens during the acute phase of infection. Positive results are indicative of the presence of the identified virus, but do not rule out bacterial infection or co-infection with other pathogens not detected by the test. Clinical correlation with patient history and other diagnostic information is necessary to determine patient infection status. The expected result is Negative.  Fact Sheet for Patients: EntrepreneurPulse.com.au  Fact Sheet for Healthcare Providers: IncredibleEmployment.be  This test is not yet approved or cleared by the [place] FDA and  has been authorized for detection and/or diagnosis of SARS-CoV-2 by FDA under an Emergency Use Authorization (EUA).  This EUA will remain in effect (meaning this test can be used) for the duration of  the COVID-19 declaration under Section 564(b)(1) of the A ct, 21 U.S.C. section 360bbb-3(b)(1), unless the authorization is terminated or revoked sooner.     Influenza A by PCR NEGATIVE NEGATIVE Final   Influenza B by PCR NEGATIVE NEGATIVE  Final    Comment: (NOTE) The Xpert Xpress SARS-CoV-2/FLU/RSV plus assay is intended as an aid in the diagnosis of influenza from Nasopharyngeal swab specimens and should not be used as a sole basis for treatment. Nasal washings and aspirates are unacceptable for Xpert Xpress SARS-CoV-2/FLU/RSV testing.  Fact Sheet for Patients: EntrepreneurPulse.com.au  Fact Sheet for Healthcare Providers: IncredibleEmployment.be  This test is not yet approved or cleared by the [place] FDA and has been authorized for detection and/or diagnosis of SARS-CoV-2 by FDA under an Emergency Use Authorization (EUA). This EUA will remain in effect (meaning this test can be used) for the duration of the COVID-19 declaration under Section 564(b)(1) of the Act, 21 U.S.C. section 360bbb-3(b)(1), unless the authorization is terminated or revoked.  Performed at [facility], [place] [address] [place], [place] [address]   Culture, blood (x 2)     Status: None (Preliminary result)   Collection Time: 12/20/21 11:50 PM   Specimen: BLOOD  Result Value Ref Range Status   Specimen Description   Final    BLOOD BLOOD RIGHT HAND Performed at [place] [address], [place], [place] [address]    Special Requests   Final    BOTTLES DRAWN AEROBIC AND ANAEROBIC Blood Culture results may not be optimal due to an inadequate volume of blood received in culture bottles Performed at [place] [address] [place], [place] [address]    Culture   Final    NO GROWTH 3 DAYS Performed at [facility], [place] [address] [place], [place] [address]    Report Status PENDING  Incomplete  Culture, blood (x 2)     Status: None (Preliminary result)   Collection Time: 12/20/21 11:50 PM   Specimen: BLOOD  Result Value Ref Range Status   Specimen Description   Final    BLOOD BLOOD LEFT HAND Performed at [place] [address], [place], [place] [address]    Special Requests   Final    BOTTLES DRAWN AEROBIC AND ANAEROBIC Blood Culture results may not be optimal due to an inadequate volume of blood received in culture bottles Performed at [place] [address] [place], [place] [address]    Culture   Final    NO GROWTH 3 DAYS Performed at [facility], [place] [address], [place], [place] [address]    Report Status PENDING  Incomplete  C Difficile Quick Screen w  PCR reflex     Status: None   Collection Time: 12/22/21 10:46 AM   Specimen: STOOL  Result Value Ref Range Status   C Diff antigen NEGATIVE NEGATIVE Final   C Diff toxin NEGATIVE NEGATIVE Final   C Diff interpretation No C. difficile detected.  Final    Comment: Performed at [facility], [place] [address], [place], [place] [address]  Gastrointestinal Panel by PCR , Stool     Status: None   Collection Time: 12/22/21 10:46 AM   Specimen: STOOL  Result Value Ref Range Status   Campylobacter species NOT DETECTED NOT DETECTED Final   Plesimonas shigelloides NOT DETECTED NOT DETECTED Final   Salmonella species NOT DETECTED NOT DETECTED Final   Yersinia enterocolitica NOT DETECTED NOT DETECTED Final   Vibrio species NOT DETECTED NOT DETECTED Final   Vibrio cholerae NOT DETECTED NOT DETECTED Final   Enteroaggregative E coli (EAEC) NOT DETECTED NOT DETECTED Final   Enteropathogenic E coli (EPEC) NOT DETECTED NOT DETECTED Final   Enterotoxigenic E coli (ETEC) NOT DETECTED NOT DETECTED Final   Shiga like toxin producing E coli (STEC) NOT DETECTED NOT DETECTED Final   Shigella/Enteroinvasive E coli (EIEC) NOT DETECTED NOT DETECTED Final   Cryptosporidium NOT DETECTED NOT DETECTED Final   Cyclospora cayetanensis NOT DETECTED NOT DETECTED Final   Entamoeba histolytica NOT DETECTED NOT DETECTED Final   Giardia lamblia NOT DETECTED NOT DETECTED Final   Adenovirus F40/41 NOT DETECTED NOT  DETECTED Final   Astrovirus NOT DETECTED NOT DETECTED Final   Norovirus GI/GII NOT DETECTED NOT DETECTED Final   Rotavirus A NOT DETECTED NOT DETECTED Final   Sapovirus (I, II, IV, and V) NOT DETECTED NOT DETECTED Final    Comment: Performed at [facility], [address], [place], [place] [address]         Radiology Studies: No results found.      Scheduled Meds:  amLODipine  5 mg Oral Daily   aspirin  81 mg Oral Daily   enoxaparin (LOVENOX) injection  40 mg Subcutaneous Q24H   fluticasone  1 spray Each Nare Daily   insulin aspart  0-5 Units Subcutaneous QHS   insulin aspart  0-9 Units Subcutaneous TID WC   Ipratropium-Albuterol  1 puff Inhalation BID   levothyroxine  125 mcg Oral QAC breakfast   loratadine  10 mg Oral Daily   metoprolol succinate  50 mg Oral Daily   montelukast  10 mg Oral QHS   pantoprazole  40 mg Oral Daily   timolol  1 drop Both Eyes BID   Continuous Infusions:     LOS: 4 days   Time spent= 35 mins    Carmella Kees Arsenio Loader, MD [facility]  If 7PM-7AM, please contact night-coverage  12/25/2021, 7:42 AM

## 2021-12-26 DIAGNOSIS — R748 Abnormal levels of other serum enzymes: Secondary | ICD-10-CM

## 2021-12-26 DIAGNOSIS — R7989 Other specified abnormal findings of blood chemistry: Secondary | ICD-10-CM

## 2021-12-26 DIAGNOSIS — N179 Acute kidney failure, unspecified: Secondary | ICD-10-CM | POA: Diagnosis not present

## 2021-12-26 DIAGNOSIS — U071 COVID-19: Secondary | ICD-10-CM | POA: Diagnosis not present

## 2021-12-26 LAB — BASIC METABOLIC PANEL
Anion gap: 7 (ref 5–15)
BUN: 16 mg/dL (ref 8–23)
CO2: 24 mmol/L (ref 22–32)
Calcium: 8.6 mg/dL — ABNORMAL LOW (ref 8.9–10.3)
Chloride: 107 mmol/L (ref 98–111)
Creatinine, Ser: 1.09 mg/dL — ABNORMAL HIGH (ref 0.44–1.00)
GFR, Estimated: 53 mL/min — ABNORMAL LOW (ref >60)
Glucose, Bld: 108 mg/dL — ABNORMAL HIGH (ref 70–99)
Potassium: 3.9 mmol/L (ref 3.5–5.1)
Sodium: 138 mmol/L (ref 135–145)

## 2021-12-26 LAB — CULTURE, BLOOD (ROUTINE X 2)
Culture: NO GROWTH
Culture: NO GROWTH

## 2021-12-26 LAB — MAGNESIUM: Magnesium: 1.6 mg/dL — ABNORMAL LOW (ref 1.7–2.4)

## 2021-12-26 LAB — GLUCOSE, CAPILLARY
Glucose-Capillary: 92 mg/dL (ref 70–99)
Glucose-Capillary: 105 mg/dL — ABNORMAL HIGH (ref 70–99)
Glucose-Capillary: 152 mg/dL — ABNORMAL HIGH (ref 70–99)
Glucose-Capillary: 117 mg/dL — ABNORMAL HIGH (ref 70–99)

## 2021-12-26 NOTE — Progress Notes (Signed)
Physical Therapy Treatment Patient Details Name: Mary [name] MRN: [id number] DOB: 07-07-1945 Today's Date: 12/26/2021   History of Present Illness 76 y.o. female admitted with covid19. PMH: type 2 diabetes, hypertension, diabetic and hypertensive retinopathy, glaucoma, central retinal vein occlusion, hyperlipidemia, hypothyroidism, GERD, substance abuse presented to the ED with complaints of diarrhea, abdominal pain, URI symptoms, and generalized weakness.  Patient was diagnosed with COVID-19 with mild URI and GI symptoms.  Also elevated troponin likely demand ischemia.    PT Comments    The patient is eager to mobilize. Patient assisted with max/total assist to partially stand and pivot to recliner, patient unable to take any steps once standing. Will  require maxisky to return to bed. Nursing aware.   Recommendations for follow up therapy are one component of a multi-disciplinary discharge planning process, led by the attending physician.  Recommendations may be updated based on patient status, additional functional criteria and insurance authorization.  Follow Up Recommendations  Skilled nursing-short term rehab (<3 hours/day) Can patient physically be transported by private vehicle: No   Assistance Recommended at Discharge Frequent or constant Supervision/Assistance  Patient can return home with the following A lot of help with bathing/dressing/bathroom;Assistance with cooking/housework;Assist for transportation;Two people to help with walking and/or transfers   Equipment Recommendations       Recommendations for Other Services       Precautions / Restrictions Precautions Precautions: Fall Precaution Comments: Low vision, BLE lymphedema     Mobility  Bed Mobility   Bed Mobility: Supine to Sit     Supine to sit: Mod assist, HOB elevated     General bed mobility comments: assist to pull up to sit after moves legs to bed edge    Transfers Overall transfer level:  Needs assistance Equipment used: Rolling walker (2 wheels)               General transfer comment: partially stood at RW, sat down, with 2 person stand using UE and bed pad  to rise, feet blocked, patient unable to move feet so assisted to turn trunk to sit down in recliner, noted feet had slid forward.    Ambulation/Gait                   Stairs             Wheelchair Mobility    Modified Rankin (Stroke Patients Only)       Balance Overall balance assessment: Needs assistance Sitting-balance support: No upper extremity supported, Feet supported Sitting balance-Leahy Scale: Good     Standing balance support: During functional activity, Bilateral upper extremity supported Standing balance-Leahy Scale: Poor                              Cognition Arousal/Alertness: Awake/alert Behavior During Therapy: WFL for tasks assessed/performed Overall Cognitive Status: Within Functional Limits for tasks assessed                                          Exercises      General Comments        Pertinent Vitals/Pain Pain Assessment Faces Pain Scale: Hurts little more Pain Location: legs Pain Descriptors / Indicators: Grimacing, Sore Pain Intervention(s): Monitored during session    Home Living  Prior Function            PT Goals (current goals can now be found in the care plan section) Progress towards PT goals: Progressing toward goals    Frequency    Min 2X/week      PT Plan Current plan remains appropriate    Co-evaluation PT/OT/SLP Co-Evaluation/Treatment: Yes Reason for Co-Treatment: Complexity of the patient's impairments (multi-system involvement);For patient/therapist safety;To address functional/ADL transfers PT goals addressed during session: Mobility/safety with mobility OT goals addressed during session: ADL's and self-care      AM-PAC PT "6 Clicks" Mobility    Outcome Measure  Help needed turning from your back to your side while in a flat bed without using bedrails?: A Little Help needed moving from lying on your back to sitting on the side of a flat bed without using bedrails?: A Little Help needed moving to and from a bed to a chair (including a wheelchair)?: Total Help needed standing up from a chair using your arms (e.g., wheelchair or bedside chair)?: Total Help needed to walk in hospital room?: Total Help needed climbing 3-5 steps with a railing? : Total 6 Click Score: 10    End of Session Equipment Utilized During Treatment: Gait belt Activity Tolerance: Patient tolerated treatment well Patient left: in chair;with call bell/phone within reach Nurse Communication: Mobility status;Other (comment);Need for lift equipment PT Visit Diagnosis: Other abnormalities of gait and mobility (R26.89);Muscle weakness (generalized) (M62.81)     Time: [id number] PT Time Calculation (min) (ACUTE ONLY): 33 min  Charges:  $Therapeutic Activity: 8-22 mins                     [place] Office [phone] Weekend [id number]    Claretha Cooper 12/26/2021, 2:26 PM

## 2021-12-26 NOTE — Progress Notes (Signed)
Occupational Therapy Treatment Patient Details Name: Mary [name] MRN: [id number] DOB: 1946-01-15 Today's Date: 12/26/2021   History of present illness 76 y.o. female admitted with covid19. PMH: type 2 diabetes, hypertension, diabetic and hypertensive retinopathy, glaucoma, central retinal vein occlusion, hyperlipidemia, hypothyroidism, GERD, substance abuse presented to the ED with complaints of diarrhea, abdominal pain, URI symptoms, and generalized weakness.  Patient was diagnosed with COVID-19 with mild URI and GI symptoms.  Also elevated troponin likely demand ischemia.   OT comments  Co treat with PT to advance patient's functional mobility in order to get OOB for ADLs. Patient mod assist for Les to transfer to edge of bed. Patient max/total assist to partially stand with walker but patient unable to move feet. Return to sitting to remove walker and attempt pivot. Patient max/total assist to pivot to recliner and required +3 assistance to position her in chair. Will require hoyer to return to bed. Continue to recommend short term rehab at discharge.    Recommendations for follow up therapy are one component of a multi-disciplinary discharge planning process, led by the attending physician.  Recommendations may be updated based on patient status, additional functional criteria and insurance authorization.    Follow Up Recommendations  Skilled nursing-short term rehab (<3 hours/day)     Assistance Recommended at Discharge Intermittent Supervision/Assistance  Patient can return home with the following  A lot of help with bathing/dressing/bathroom;A lot of help with walking and/or transfers;Assistance with cooking/housework;Help with stairs or ramp for entrance;Assist for transportation   Equipment Recommendations  None recommended by OT    Recommendations for Other Services      Precautions / Restrictions Precautions Precautions: Fall Precaution Comments: Low vision, BLE  lymphedema Restrictions Weight Bearing Restrictions: No       Mobility Bed Mobility Overal bed mobility: Needs Assistance Bed Mobility: Supine to Sit     Supine to sit: Mod assist, HOB elevated Sit to supine: Mod assist   General bed mobility comments: assist to pull up to sit after moves legs to bed edge    Transfers Overall transfer level: Needs assistance Equipment used: Rolling walker (2 wheels)               General transfer comment: partially stood at RW, sat down, with 2 person stand using UE and bed pad  to rise, feet blocked, patient unable to move feet so assisted to turn trunk to sit down in recliner, noted feet had slid forward.     Balance Overall balance assessment: Needs assistance Sitting-balance support: No upper extremity supported, Feet supported Sitting balance-Leahy Scale: Good Sitting balance - Comments: seated EOB   Standing balance support: During functional activity, Reliant on assistive device for balance Standing balance-Leahy Scale: Poor                             ADL either performed or assessed with clinical judgement   ADL                                              Extremity/Trunk Assessment              Vision Patient Visual Report: No change from baseline Additional Comments: Blind in Left Eye, Poor vision in Right (can't make out faces)   Perception     Praxis  Cognition Arousal/Alertness: Awake/alert Behavior During Therapy: WFL for tasks assessed/performed Overall Cognitive Status: Within Functional Limits for tasks assessed                                          Exercises      Shoulder Instructions       General Comments      Pertinent Vitals/ Pain       Pain Assessment Pain Assessment: Faces Faces Pain Scale: Hurts little more Pain Location: legs Pain Descriptors / Indicators: Grimacing, Sore Pain Intervention(s): Monitored during  session  Home Living                                          Prior Functioning/Environment              Frequency  Min 2X/week        Progress Toward Goals  OT Goals(current goals can now be found in the care plan section)  Progress towards OT goals: Progressing toward goals  Acute Rehab OT Goals Patient Stated Goal: to return to baseline OT Goal Formulation: With patient Time For Goal Achievement: 01/04/22 Potential to Achieve Goals: Fair  Plan Discharge plan remains appropriate    Co-evaluation    PT/OT/SLP Co-Evaluation/Treatment: Yes Reason for Co-Treatment: For patient/therapist safety;To address functional/ADL transfers PT goals addressed during session: Mobility/safety with mobility OT goals addressed during session: ADL's and self-care      AM-PAC OT "6 Clicks" Daily Activity     Outcome Measure   Help from another person eating meals?: A Little Help from another person taking care of personal grooming?: A Little Help from another person toileting, which includes using toliet, bedpan, or urinal?: Total Help from another person bathing (including washing, rinsing, drying)?: A Lot Help from another person to put on and taking off regular upper body clothing?: A Little Help from another person to put on and taking off regular lower body clothing?: Total 6 Click Score: 13    End of Session    OT Visit Diagnosis: Muscle weakness (generalized) (M62.81)   Activity Tolerance Patient tolerated treatment well   Patient Left in chair;with call bell/phone within reach;with chair alarm set;with nursing/sitter in room   Nurse Communication Mobility status        Time: 9937-1696 OT Time Calculation (min): 26 min  Charges: OT General Charges $OT Visit: 1 Visit OT Treatments $Therapeutic Activity: 8-22 mins  [name], OTR/L Acute Care Rehab Services  Office [phone]   Kelli Churn 12/26/2021, 3:01 PM

## 2021-12-26 NOTE — Progress Notes (Signed)
[facility]                                                                               Mary [name], is a 76 y.o. female, DOB - [birth year], SRP:[id number] Admit date - 12/19/2021    Outpatient Primary MD for the patient is Dasanayaka, Newton Pigg, MD  LOS - 5  days    Brief summary    76 y.o. female with medical history significant of type 2 diabetes, hypertension, diabetic and hypertensive retinopathy, glaucoma, central retinal vein occlusion, hyperlipidemia, hypothyroidism, GERD, substance abuse presented to the ED with complaints of diarrhea, abdominal pain, URI symptoms, and generalized weakness.  Patient was diagnosed with COVID-19 with mild URI and GI symptoms.  Also elevated troponin likely demand ischemia.  During the hospitalization patient was started on 5 days of molnupiravir, supportive care and IV fluids.  PT/OT recommended SNF, TOC working on placement at this time.  Patient is very motivated to work with therapy, if she continues to improve.  Awaiting SNF placement    Assessment & Plan    Assessment and Plan:   COVID-19 viral infection Continue with supportive care.    Elevated troponin Demand ischemia from viral infection.  No chest pain EKG does not show any ischemic changes. Patient denies any chest pain Echocardiogram shows grade 2 diastolic dysfunction   Rhabdomyolysis:  Much improved.    Hypertension:  Well controlled.     Type 2 dm: well controlled.  CBG (last 3)  Recent Labs    12/25/21 2117 12/26/21 0731 12/26/21 1150  GLUCAP 116* 92 105*   Resume SSI.     Chronic normocytic anemia:  Stable hemoglobin.    Hypothyroidism:  Resume Synthroid.    Bilateral lower extremity chronic lymphedema Chronic.    Estimated body mass index is 49.72 kg/m as calculated from the following:   Height as of this encounter: 5\' 7"  (1.702 m).   Weight as of this encounter: 144 kg.  Code Status: FULL CODE.  DVT Prophylaxis:   enoxaparin (LOVENOX) injection 40 mg Start: 12/20/21 1000   Level of Care: Level of care: Med-Surg Family Communication: NONE AT BEDSIDE.   Disposition Plan:     Remains inpatient appropriate: Waiting for SNF placement.   Procedures:  NONE.   Consultants:   NONE.   Antimicrobials:   Anti-infectives (From admission, onward)    Start     Dose/Rate Route Frequency Ordered Stop   12/24/21 0000  nirmatrelvir/ritonavir EUA, renal dosing, (PAXLOVID) 10 x 150 MG & 10 x 100MG  TABS        2 tablet Oral 2 times daily 12/24/21 1002 12/29/21 2359   12/20/21 1000  molnupiravir EUA (LAGEVRIO) capsule 800 mg  Status:  Discontinued        4 capsule Oral 2 times daily 12/20/21 0347 12/20/21 0348   12/20/21 1000  nirmatrelvir/ritonavir EUA (PAXLOVID) 3 tablet  Status:  Discontinued        3 tablet Oral 2 times daily 12/20/21 0352 12/20/21 0355   12/20/21 1000  nirmatrelvir/ritonavir EUA (renal dosing) (PAXLOVID) 2 tablet        2 tablet Oral 2 times daily 12/20/21 0356 12/24/21  2117        Medications  Scheduled Meds:  amLODipine  5 mg Oral Daily   aspirin  81 mg Oral Daily   dextromethorphan-guaiFENesin  1 tablet Oral BID   enoxaparin (LOVENOX) injection  40 mg Subcutaneous Q24H   fluticasone  1 spray Each Nare Daily   insulin aspart  0-5 Units Subcutaneous QHS   insulin aspart  0-9 Units Subcutaneous TID WC   Ipratropium-Albuterol  1 puff Inhalation BID   levothyroxine  125 mcg Oral QAC breakfast   loratadine  10 mg Oral Daily   metoprolol succinate  50 mg Oral Daily   montelukast  10 mg Oral QHS   pantoprazole  40 mg Oral Daily   timolol  1 drop Both Eyes BID   Continuous Infusions: PRN Meds:.acetaminophen, guaiFENesin, hydrALAZINE, metoprolol tartrate, ondansetron (ZOFRAN) IV, oxyCODONE, sodium chloride, traZODone    Subjective:   Mary [name] was seen and examined today.  No new complaints . Breathing  has improved.  She is on RA.   Objective:   Vitals:   12/25/21  1357 12/25/21 2049 12/25/21 2115 12/26/21 0522  BP: (!) 127/58  (!) 149/99 (!) 146/55  Pulse: 75  80 73  Resp: 17  18 18   Temp: 98.2 F (36.8 C)  98.6 F (37 C) 98.4 F (36.9 C)  TempSrc: Oral  Oral Oral  SpO2: 95% 97% 99% 96%  Weight:      Height:       No intake or output data in the 24 hours ending 12/26/21 1315 Filed Weights   12/21/21 1101  Weight: (!) 144 kg     Exam General exam: Appears calm and comfortable  Respiratory system: Clear to auscultation. Respiratory effort normal. Cardiovascular system: S1 & S2 heard, RRR. No JVD, Gastrointestinal system: Abdomen is nondistended, soft and nontender. Central nervous system: Alert and oriented. No focal neurological deficits. Extremities: bilateral lower extremity lymphedema Skin: No rashes, lesions or ulcers Psychiatry: Mood & affect appropriate.     Data Reviewed:  I have personally reviewed following labs and imaging studies   CBC Lab Results  Component Value Date   WBC 8.7 12/24/2021   RBC 2.63 (L) 12/24/2021   HGB 7.7 (L) 12/24/2021   HCT 24.6 (L) 12/24/2021   MCV 93.5 12/24/2021   MCH 29.3 12/24/2021   PLT 290 12/24/2021   MCHC 31.3 12/24/2021   RDW 14.0 12/24/2021   LYMPHSABS 1.0 12/20/2021   MONOABS 0.6 12/20/2021   EOSABS 0.0 12/20/2021   BASOSABS 0.0 12/20/2021     Last metabolic panel Lab Results  Component Value Date   NA 138 12/26/2021   K 3.9 12/26/2021   CL 107 12/26/2021   CO2 24 12/26/2021   BUN 16 12/26/2021   CREATININE 1.09 (H) 12/26/2021   GLUCOSE 108 (H) 12/26/2021   GFRNONAA 53 (L) 12/26/2021   GFRAA 49 (L) 11/24/2019   CALCIUM 8.6 (L) 12/26/2021   PROT 6.7 12/20/2021   ALBUMIN 2.9 (L) 12/20/2021   LABGLOB 3.4 12/12/2020   AGRATIO 1.2 12/12/2020   BILITOT 1.6 (H) 12/20/2021   ALKPHOS 71 12/20/2021   AST 23 12/20/2021   ALT 12 12/20/2021   ANIONGAP 7 12/26/2021    CBG (last 3)  Recent Labs    12/25/21 2117 12/26/21 0731 12/26/21 1150  GLUCAP 116* 92 105*       Coagulation Profile: No results for input(s): "INR", "PROTIME" in the last 168 hours.   Radiology Studies: No results found.  Kathlen Mody M.D. [facility] 12/26/2021, 1:15 PM  Available via Epic secure chat 7am-7pm After 7 pm, please refer to night coverage provider listed on amion.

## 2021-12-27 ENCOUNTER — Ambulatory Visit (HOSPITAL_COMMUNITY): Payer: Medicare (Managed Care)

## 2021-12-27 ENCOUNTER — Ambulatory Visit: Payer: Medicare (Managed Care) | Admitting: Vascular Surgery

## 2021-12-27 LAB — GLUCOSE, CAPILLARY
Glucose-Capillary: 102 mg/dL — ABNORMAL HIGH (ref 70–99)
Glucose-Capillary: 129 mg/dL — ABNORMAL HIGH (ref 70–99)
Glucose-Capillary: 129 mg/dL — ABNORMAL HIGH (ref 70–99)
Glucose-Capillary: 143 mg/dL — ABNORMAL HIGH (ref 70–99)
Glucose-Capillary: 127 mg/dL — ABNORMAL HIGH (ref 70–99)

## 2021-12-27 NOTE — Care Management Important Message (Signed)
Important Message  Patient Details IM Letter given Name: Mary [name] MRN: [id number] Date of Birth: November 05, 1945   Medicare Important Message Given:  Yes     Caren Macadam 12/27/2021, 1:03 PM

## 2021-12-27 NOTE — Progress Notes (Signed)
[facility]                                                                               Mary [name], is a 76 y.o. female, DOB - 1945/06/18, POE:[id number] Admit date - 12/19/2021    Outpatient Primary MD for the patient is Dasanayaka, Newton Pigg, MD  LOS - 6  days    Brief summary    76 y.o. female with medical history significant of type 2 diabetes, hypertension, diabetic and hypertensive retinopathy, glaucoma, central retinal vein occlusion, hyperlipidemia, hypothyroidism, GERD, substance abuse presented to the ED with complaints of diarrhea, abdominal pain, URI symptoms, and generalized weakness.  Patient was diagnosed with COVID-19 with mild URI and GI symptoms.  Also elevated troponin likely demand ischemia.  During the hospitalization patient was started on 5 days of molnupiravir, supportive care and IV fluids.  PT/OT recommended SNF, TOC working on placement at this time.  Patient is very motivated to work with therapy, if she continues to improve.  Awaiting SNF placement    Assessment & Plan    Assessment and Plan:   COVID-19 viral infection Continue with supportive care. She denies any sob or chest pain.    Elevated troponin Demand ischemia from viral infection.  No chest pain EKG does not show any ischemic changes. Patient denies any chest pain Echocardiogram shows grade 2 diastolic dysfunction   Rhabdomyolysis:  Much improved.    Hypertension:  BP parameters are optimal.     Type 2 dm: well controlled. CBG'S. CBG (last 3)  Recent Labs    12/26/21 2213 12/27/21 0812 12/27/21 1151  GLUCAP 117* 102* 129*  129*    Resume SSI.     Chronic normocytic anemia:  Stable hemoglobin.    Hypothyroidism:  Resume Synthroid.    Bilateral lower extremity chronic lymphedema Chronic.    Estimated body mass index is 49.72 kg/m as calculated from the following:   Height as of this encounter: 5\' 7"  (1.702 m).   Weight as of this  encounter: 144 kg.  Code Status: FULL CODE.  DVT Prophylaxis:  enoxaparin (LOVENOX) injection 40 mg Start: 12/20/21 1000   Level of Care: Level of care: Med-Surg Family Communication: NONE AT BEDSIDE.   Disposition Plan:     Remains inpatient appropriate: Waiting for SNF placement.   Procedures:  NONE.   Consultants:   NONE.   Antimicrobials:   Anti-infectives (From admission, onward)    Start     Dose/Rate Route Frequency Ordered Stop   12/24/21 0000  nirmatrelvir/ritonavir EUA, renal dosing, (PAXLOVID) 10 x 150 MG & 10 x 100MG  TABS        2 tablet Oral 2 times daily 12/24/21 1002 12/29/21 2359   12/20/21 1000  molnupiravir EUA (LAGEVRIO) capsule 800 mg  Status:  Discontinued        4 capsule Oral 2 times daily 12/20/21 0347 12/20/21 0348   12/20/21 1000  nirmatrelvir/ritonavir EUA (PAXLOVID) 3 tablet  Status:  Discontinued        3 tablet Oral 2 times daily 12/20/21 0352 12/20/21 0355   12/20/21 1000  nirmatrelvir/ritonavir EUA (renal dosing) (PAXLOVID) 2 tablet  2 tablet Oral 2 times daily 12/20/21 0356 12/24/21 2117        Medications  Scheduled Meds:  amLODipine  5 mg Oral Daily   aspirin  81 mg Oral Daily   dextromethorphan-guaiFENesin  1 tablet Oral BID   enoxaparin (LOVENOX) injection  40 mg Subcutaneous Q24H   fluticasone  1 spray Each Nare Daily   insulin aspart  0-5 Units Subcutaneous QHS   insulin aspart  0-9 Units Subcutaneous TID WC   Ipratropium-Albuterol  1 puff Inhalation BID   levothyroxine  125 mcg Oral QAC breakfast   loratadine  10 mg Oral Daily   metoprolol succinate  50 mg Oral Daily   montelukast  10 mg Oral QHS   pantoprazole  40 mg Oral Daily   timolol  1 drop Both Eyes BID   Continuous Infusions: PRN Meds:.acetaminophen, guaiFENesin, hydrALAZINE, metoprolol tartrate, ondansetron (ZOFRAN) IV, oxyCODONE, sodium chloride, traZODone    Subjective:   Mary [name] was seen and examined today. No new complaints.  She is on RA.    Objective:   Vitals:   12/26/21 1231 12/26/21 1941 12/26/21 2023 12/27/21 0524  BP: (!) 129/53  (!) 121/54 (!) 136/55  Pulse: 76  79 80  Resp:   16 16  Temp: 98.4 F (36.9 C)  98.1 F (36.7 C) 98 F (36.7 C)  TempSrc: Oral  Oral Oral  SpO2: 99% 99% 99% 100%  Weight:      Height:        Intake/Output Summary (Last 24 hours) at 12/27/2021 1553 Last data filed at 12/26/2021 2027 Gross per 24 hour  Intake 709 ml  Output 1100 ml  Net -391 ml   Filed Weights   12/21/21 1101  Weight: (!) 144 kg     Exam General exam: Appears calm and comfortable  Respiratory system: Clear to auscultation. Respiratory effort normal. Cardiovascular system: S1 & S2 heard, RRR. No JVD, murmurs, rubs, gallops or clicks. No pedal edema. Gastrointestinal system: Abdomen is nondistended, soft and nontender.  Normal bowel sounds heard. Central nervous system: Alert and oriented. No focal neurological deficits. Extremities: Symmetric 5 x 5 power. Skin: No rashes, lesions or ulcers Psychiatry: Mood & affect appropriate.      Data Reviewed:  I have personally reviewed following labs and imaging studies   CBC Lab Results  Component Value Date   WBC 8.7 12/24/2021   RBC 2.63 (L) 12/24/2021   HGB 7.7 (L) 12/24/2021   HCT 24.6 (L) 12/24/2021   MCV 93.5 12/24/2021   MCH 29.3 12/24/2021   PLT 290 12/24/2021   MCHC 31.3 12/24/2021   RDW 14.0 12/24/2021   LYMPHSABS 1.0 12/20/2021   MONOABS 0.6 12/20/2021   EOSABS 0.0 12/20/2021   BASOSABS 0.0 12/20/2021     Last metabolic panel Lab Results  Component Value Date   NA 138 12/26/2021   K 3.9 12/26/2021   CL 107 12/26/2021   CO2 24 12/26/2021   BUN 16 12/26/2021   CREATININE 1.09 (H) 12/26/2021   GLUCOSE 108 (H) 12/26/2021   GFRNONAA 53 (L) 12/26/2021   GFRAA 49 (L) 11/24/2019   CALCIUM 8.6 (L) 12/26/2021   PROT 6.7 12/20/2021   ALBUMIN 2.9 (L) 12/20/2021   LABGLOB 3.4 12/12/2020   AGRATIO 1.2 12/12/2020   BILITOT 1.6 (H)  12/20/2021   ALKPHOS 71 12/20/2021   AST 23 12/20/2021   ALT 12 12/20/2021   ANIONGAP 7 12/26/2021    CBG (last 3)  Recent Labs  12/26/21 2213 12/27/21 0812 12/27/21 1151  GLUCAP 117* 102* 129*  129*       Coagulation Profile: No results for input(s): "INR", "PROTIME" in the last 168 hours.   Radiology Studies: No results found.     Kathlen Mody M.D. [facility] 12/27/2021, 3:53 PM  Available via Epic secure chat 7am-7pm After 7 pm, please refer to night coverage provider listed on amion.

## 2021-12-27 NOTE — TOC Progression Note (Addendum)
Transition of Care [facility]) - Progression Note    Patient Details  Name: Mary [name] MRN: [id number] Date of Birth: 12/26/1945  [facility]) CM/SW Contact  Otelia Santee, LCSW Phone Number: 12/27/2021, 9:01 AM  Clinical Narrative:    Referrals have been sent for SNF placement. Currently awaiting bed offers. Pt will be able to transfer to SNF following COVID isolation period.    Update 10:40am- Pt has accepted bed offer for SNF at [facility]. [facility] to determine if can accept Sunday vs Monday following their discharges on Friday.    Expected Discharge Plan: Skilled Nursing Facility Barriers to Discharge: SNF Covid  Expected Discharge Plan and Services Expected Discharge Plan: Skilled Nursing Facility In-house Referral: NA Discharge Planning Services: CM Consult Post Acute Care Choice: Skilled Nursing Facility Living arrangements for the past 2 months: Single Family Home                 DME Arranged: N/A DME Agency: NA                   Social Determinants of Health (SDOH) Interventions Food Insecurity Interventions: Intervention Not Indicated Housing Interventions: Intervention Not Indicated Utilities Interventions: Intervention Not Indicated  Readmission Risk Interventions    12/21/2021    3:27 PM 12/21/2021   12:27 PM  Readmission Risk Prevention Plan  Post Dischage Appt Complete Complete  Medication Screening Complete Complete  Transportation Screening Complete Complete

## 2021-12-28 LAB — BASIC METABOLIC PANEL
Anion gap: 6 (ref 5–15)
BUN: 20 mg/dL (ref 8–23)
CO2: 25 mmol/L (ref 22–32)
Calcium: 8.9 mg/dL (ref 8.9–10.3)
Chloride: 107 mmol/L (ref 98–111)
Creatinine, Ser: 0.99 mg/dL (ref 0.44–1.00)
GFR, Estimated: 60 mL/min — ABNORMAL LOW (ref >60)
Glucose, Bld: 141 mg/dL — ABNORMAL HIGH (ref 70–99)
Potassium: 4 mmol/L (ref 3.5–5.1)
Sodium: 138 mmol/L (ref 135–145)

## 2021-12-28 LAB — GLUCOSE, CAPILLARY
Glucose-Capillary: 96 mg/dL (ref 70–99)
Glucose-Capillary: 137 mg/dL — ABNORMAL HIGH (ref 70–99)
Glucose-Capillary: 141 mg/dL — ABNORMAL HIGH (ref 70–99)
Glucose-Capillary: 139 mg/dL — ABNORMAL HIGH (ref 70–99)

## 2021-12-28 LAB — MAGNESIUM: Magnesium: 1.8 mg/dL (ref 1.7–2.4)

## 2021-12-28 MED ORDER — BENZONATATE 100 MG PO CAPS
200.0000 mg | ORAL_CAPSULE | Freq: Three times a day (TID) | ORAL | Status: DC
  Administered 2021-12-28 – 2021-12-30 (×6): 200 mg via ORAL
  Filled 2021-12-28 (×6): qty 2

## 2021-12-28 NOTE — Progress Notes (Signed)
[facility]                                                                               Mary [name], is a 76 y.o. female, DOB - February 12, 1945, KCL:[id number] Admit date - 12/19/2021    Outpatient Primary MD for the patient is Dasanayaka, Newton Pigg, MD  LOS - 7  days    Brief summary    76 y.o. female with medical history significant of type 2 diabetes, hypertension, diabetic and hypertensive retinopathy, glaucoma, central retinal vein occlusion, hyperlipidemia, hypothyroidism, GERD, substance abuse presented to the ED with complaints of diarrhea, abdominal pain, URI symptoms, and generalized weakness.  Patient was diagnosed with COVID-19 with mild URI and GI symptoms.  Also elevated troponin likely demand ischemia.  During the hospitalization patient was started on 5 days of molnupiravir, supportive care and IV fluids.  PT/OT recommended SNF, TOC working on placement at this time.  Patient is very motivated to work with therapy, if she continues to improve.  Awaiting SNF placement    Assessment & Plan    Assessment and Plan:   COVID-19 viral infection Continue with supportive care. She denies any sob or chest pain.  She reports cough , added tessalon. She remains on RA.    Elevated troponin Demand ischemia from viral infection.  No chest pain EKG does not show any ischemic changes. Patient denies any chest pain Echocardiogram shows grade 2 diastolic dysfunction   Rhabdomyolysis:  Much improved.    Hypertension:  BP parameters are well controlled.    Hypomagnesemia: replaced.    Type 2 dm: well controlled. CBG'S. CBG (last 3)  Recent Labs    12/27/21 2128 12/28/21 0757 12/28/21 1204  GLUCAP 127* 96 137*    Resume SSI.     Chronic normocytic anemia:  Stable hemoglobin.    Hypothyroidism:  Resume Synthroid.    Bilateral lower extremity chronic lymphedema Chronic.    Estimated body mass index is 49.72 kg/m as calculated from the  following:   Height as of this encounter: 5\' 7"  (1.702 m).   Weight as of this encounter: 144 kg.  Code Status: FULL CODE.  DVT Prophylaxis:  enoxaparin (LOVENOX) injection 40 mg Start: 12/20/21 1000   Level of Care: Level of care: Med-Surg Family Communication: NONE AT BEDSIDE.   Disposition Plan:     Remains inpatient appropriate: Waiting for SNF placement.   Procedures:  NONE.   Consultants:   NONE.   Antimicrobials:   Anti-infectives (From admission, onward)    Start     Dose/Rate Route Frequency Ordered Stop   12/24/21 0000  nirmatrelvir/ritonavir EUA, renal dosing, (PAXLOVID) 10 x 150 MG & 10 x 100MG  TABS        2 tablet Oral 2 times daily 12/24/21 1002 12/29/21 2359   12/20/21 1000  molnupiravir EUA (LAGEVRIO) capsule 800 mg  Status:  Discontinued        4 capsule Oral 2 times daily 12/20/21 0347 12/20/21 0348   12/20/21 1000  nirmatrelvir/ritonavir EUA (PAXLOVID) 3 tablet  Status:  Discontinued        3 tablet Oral 2 times daily 12/20/21 0352 12/20/21 0355  12/20/21 1000  nirmatrelvir/ritonavir EUA (renal dosing) (PAXLOVID) 2 tablet        2 tablet Oral 2 times daily 12/20/21 0356 12/24/21 2117        Medications  Scheduled Meds:  amLODipine  5 mg Oral Daily   aspirin  81 mg Oral Daily   benzonatate  200 mg Oral TID   dextromethorphan-guaiFENesin  1 tablet Oral BID   enoxaparin (LOVENOX) injection  40 mg Subcutaneous Q24H   fluticasone  1 spray Each Nare Daily   insulin aspart  0-5 Units Subcutaneous QHS   insulin aspart  0-9 Units Subcutaneous TID WC   Ipratropium-Albuterol  1 puff Inhalation BID   levothyroxine  125 mcg Oral QAC breakfast   loratadine  10 mg Oral Daily   metoprolol succinate  50 mg Oral Daily   montelukast  10 mg Oral QHS   pantoprazole  40 mg Oral Daily   timolol  1 drop Both Eyes BID   Continuous Infusions: PRN Meds:.acetaminophen, guaiFENesin, hydrALAZINE, metoprolol tartrate, ondansetron (ZOFRAN) IV, oxyCODONE, sodium chloride,  traZODone    Subjective:   Mary [name] was seen and examined today. Intermittent cough, improving.   Objective:   Vitals:   12/27/21 2030 12/28/21 0506 12/28/21 0811 12/28/21 1350  BP:  (!) 125/59  (!) 124/45  Pulse:  75  81  Resp:  16  18  Temp:  98.6 F (37 C)  98.6 F (37 C)  TempSrc:  Oral    SpO2: 100% 96% 95% 98%  Weight:      Height:        Intake/Output Summary (Last 24 hours) at 12/28/2021 1356 Last data filed at 12/28/2021 0500 Gross per 24 hour  Intake 440 ml  Output 1350 ml  Net -910 ml    Filed Weights   12/21/21 1101  Weight: (!) 144 kg     Exam General exam: Appears calm and comfortable  Respiratory system: Clear to auscultation. Respiratory effort normal. Cardiovascular system: S1 & S2 heard, RRR. No JVD,  No pedal edema. Gastrointestinal system: Abdomen is nondistended, Normal bowel sounds heard. Central nervous system: Alert and oriented. No focal neurological deficits. Extremities: Symmetric 5 x 5 power. Skin: No rashes,  Psychiatry:  Mood & affect appropriate.       Data Reviewed:  I have personally reviewed following labs and imaging studies   CBC Lab Results  Component Value Date   WBC 8.7 12/24/2021   RBC 2.63 (L) 12/24/2021   HGB 7.7 (L) 12/24/2021   HCT 24.6 (L) 12/24/2021   MCV 93.5 12/24/2021   MCH 29.3 12/24/2021   PLT 290 12/24/2021   MCHC 31.3 12/24/2021   RDW 14.0 12/24/2021   LYMPHSABS 1.0 12/20/2021   MONOABS 0.6 12/20/2021   EOSABS 0.0 12/20/2021   BASOSABS 0.0 12/20/2021     Last metabolic panel Lab Results  Component Value Date   NA 138 12/28/2021   K 4.0 12/28/2021   CL 107 12/28/2021   CO2 25 12/28/2021   BUN 20 12/28/2021   CREATININE 0.99 12/28/2021   GLUCOSE 141 (H) 12/28/2021   GFRNONAA 60 (L) 12/28/2021   GFRAA 49 (L) 11/24/2019   CALCIUM 8.9 12/28/2021   PROT 6.7 12/20/2021   ALBUMIN 2.9 (L) 12/20/2021   LABGLOB 3.4 12/12/2020   AGRATIO 1.2 12/12/2020   BILITOT 1.6 (H) 12/20/2021    ALKPHOS 71 12/20/2021   AST 23 12/20/2021   ALT 12 12/20/2021   ANIONGAP 6 12/28/2021    CBG (  last 3)  Recent Labs    12/27/21 2128 12/28/21 0757 12/28/21 1204  GLUCAP 127* 96 137*       Coagulation Profile: No results for input(s): "INR", "PROTIME" in the last 168 hours.   Radiology Studies: No results found.     Kathlen Mody M.D. [facility] 12/28/2021, 1:56 PM  Available via Epic secure chat 7am-7pm After 7 pm, please refer to night coverage provider listed on amion.

## 2021-12-28 NOTE — Progress Notes (Signed)
Physical Therapy Treatment Patient Details Name: Mary [name] MRN: [id number] DOB: 09-25-1945 Today's Date: 12/28/2021   History of Present Illness 76 y.o. female admitted with covid19. PMH: type 2 diabetes, hypertension, diabetic and hypertensive retinopathy, glaucoma, central retinal vein occlusion, hyperlipidemia, hypothyroidism, GERD, substance abuse presented to the ED with complaints of diarrhea, abdominal pain, URI symptoms, and generalized weakness.  Patient was diagnosed with COVID-19 with mild URI and GI symptoms.  Also elevated troponin likely demand ischemia.    PT Comments    AxO x 3 very pleasant and willing.  Pt stated she was able to transfer herselt at home and used a wheelchair to get to her appointments.  "[facility] would bring their van". Assisted OOB to recliner was difficult.  General bed mobility comments: assist for upper body ans well as B LE.  Greatest difficulty was scooting to EOB. Prior to admit, pt stated she "self able" from a regular flat bed.  General transfer comment: Pt stated her "Left" leg was stronger.  Pt was able to partially stand by shifting her upper body weight forwad leaning far over then take a few short side/pivot steps to recliner.  Pt was unable to complete the full 1/4 pivot so use bed pad that was under her to asisst with pulling hips over to center of chair. Positioned in recliner to comfort with B LE elevated.   Pt will be a SKY LIFT back to bed.  Reported to NT "be sure to cross the leg straps".   Pt will need ST Rehab at SNF to address mobility and functional decline prior to safely returning home.   Recommendations for follow up therapy are one component of a multi-disciplinary discharge planning process, led by the attending physician.  Recommendations may be updated based on patient status, additional functional criteria and insurance authorization.  Follow Up Recommendations  Skilled nursing-short term rehab (<3 hours/day) Can patient  physically be transported by private vehicle: No   Assistance Recommended at Discharge Frequent or constant Supervision/Assistance  Patient can return home with the following A lot of help with bathing/dressing/bathroom;Assistance with cooking/housework;Assist for transportation;Two people to help with walking and/or transfers   Equipment Recommendations  None recommended by PT    Recommendations for Other Services       Precautions / Restrictions Precautions Precautions: Fall Precaution Comments: Low vision, BLE lymphedema Restrictions Weight Bearing Restrictions: No     Mobility  Bed Mobility Overal bed mobility: Needs Assistance Bed Mobility: Supine to Sit     Supine to sit: Mod assist, HOB elevated     General bed mobility comments: assist for upper body ans well as B LE.  Greatest difficulty was scooting to EOB.    Transfers Overall transfer level: Needs assistance Equipment used: Rolling walker (2 wheels)               General transfer comment: Pt stated her "Left" leg was stronger.  Pt was able to partially stand by shifting her upper body weight forwad leaning far over then take a few short side/pivot steps to recliner.  Pt was unable to complete the full 1/4 pivot so use bed pad that was under her to asisst with pulling hips over to center of chair.    Ambulation/Gait               General Gait Details: transfers only this session   Stairs             Wheelchair Mobility  Modified Rankin (Stroke Patients Only)       Balance                                            Cognition Arousal/Alertness: Awake/alert Behavior During Therapy: WFL for tasks assessed/performed Overall Cognitive Status: Within Functional Limits for tasks assessed                                 General Comments: AxO x 3 very pleasant and willing.  Pt stated she was able to transfer herselt at home and used a wheelchair to get  to her appointments.  "[facility] their [facility]".        Exercises      General Comments        Pertinent Vitals/Pain Pain Assessment Pain Assessment: Faces Faces Pain Scale: Hurts a little bit Pain Location: B LE Pain Descriptors / Indicators: Grimacing, Sore Pain Intervention(s): Monitored during session, Repositioned    Home Living                          Prior Function            PT Goals (current goals can now be found in the care plan section) Progress towards PT goals: Progressing toward goals    Frequency    Min 2X/week      PT Plan Current plan remains appropriate    Co-evaluation              AM-PAC PT "6 Clicks" Mobility   Outcome Measure  Help needed turning from your back to your side while in a flat bed without using bedrails?: A Lot Help needed moving from lying on your back to sitting on the side of a flat bed without using bedrails?: A Lot Help needed moving to and from a bed to a chair (including a wheelchair)?: A Lot Help needed standing up from a chair using your arms (e.g., wheelchair or bedside chair)?: A Lot Help needed to walk in hospital room?: Total   6 Click Score: 9    End of Session Equipment Utilized During Treatment: Gait belt Activity Tolerance: Patient tolerated treatment well Patient left: in chair;with call bell/phone within reach Nurse Communication: Mobility status;Need for lift equipment PT Visit Diagnosis: Other abnormalities of gait and mobility (R26.89);Muscle weakness (generalized) (M62.81)     Time: [id number] PT Time Calculation (min) (ACUTE ONLY): 27 min  Charges:  $Therapeutic Activity: 23-37 mins                     Rica Koyanagi  PTA Acute  Rehabilitation Services Office M-F          [phone] Weekend pager [phone]

## 2021-12-28 NOTE — TOC Progression Note (Addendum)
Transition of Care [facility]) - Progression Note    Patient Details  Name: Mary [name] MRN: [id number] Date of Birth: February 21, 1945  [facility] CM/SW Contact  Otelia Santee, LCSW Phone Number: 12/28/2021, 12:22 PM  Clinical Narrative:    [name] able to accept pt to their facility on Sunday. Spoke with pt's daughter and confirmed plan. Pt's daughter is to go to SNF today to complete paperwork for transfer. CSW spoke with Marchelle Folks at [facility] to discuss plan. Marchelle Folks will be working to get authorization for transportation/explore transportation options.    Update 3:45pm- Can call [name] [phone]) at [facility] to arrange for transport on Sunday.    Expected Discharge Plan: Skilled Nursing Facility Barriers to Discharge: SNF Covid  Expected Discharge Plan and Services Expected Discharge Plan: Skilled Nursing Facility In-house Referral: NA Discharge Planning Services: CM Consult Post Acute Care Choice: Skilled Nursing Facility Living arrangements for the past 2 months: Single Family Home                 DME Arranged: N/A DME Agency: NA                   Social Determinants of Health (SDOH) Interventions Food Insecurity Interventions: Intervention Not Indicated Housing Interventions: Intervention Not Indicated Utilities Interventions: Intervention Not Indicated  Readmission Risk Interventions    12/28/2021   12:21 PM 12/21/2021    3:27 PM 12/21/2021   12:27 PM  Readmission Risk Prevention Plan  Post Dischage Appt Complete Complete Complete  Medication Screening Complete Complete Complete  Transportation Screening Complete Complete Complete

## 2021-12-29 LAB — GLUCOSE, CAPILLARY
Glucose-Capillary: 87 mg/dL (ref 70–99)
Glucose-Capillary: 119 mg/dL — ABNORMAL HIGH (ref 70–99)
Glucose-Capillary: 118 mg/dL — ABNORMAL HIGH (ref 70–99)
Glucose-Capillary: 98 mg/dL (ref 70–99)

## 2021-12-29 LAB — BASIC METABOLIC PANEL WITH GFR
Anion gap: 7 (ref 5–15)
BUN: 26 mg/dL — ABNORMAL HIGH (ref 8–23)
CO2: 25 mmol/L (ref 22–32)
Calcium: 8.8 mg/dL — ABNORMAL LOW (ref 8.9–10.3)
Chloride: 108 mmol/L (ref 98–111)
Creatinine, Ser: 1.13 mg/dL — ABNORMAL HIGH (ref 0.44–1.00)
GFR, Estimated: 51 mL/min — ABNORMAL LOW
Glucose, Bld: 117 mg/dL — ABNORMAL HIGH (ref 70–99)
Potassium: 4.2 mmol/L (ref 3.5–5.1)
Sodium: 140 mmol/L (ref 135–145)

## 2021-12-29 LAB — MAGNESIUM: Magnesium: 1.8 mg/dL (ref 1.7–2.4)

## 2021-12-29 NOTE — Progress Notes (Signed)
[facility]                                                                               Mary [name], is a 76 y.o. female, DOB - 1945-02-12, [id number] Admit date - 12/19/2021    Outpatient Primary MD for the patient is Dasanayaka, Newton Pigg, MD  LOS - 8  days    Brief summary    76 y.o. female with medical history significant of type 2 diabetes, hypertension, diabetic and hypertensive retinopathy, glaucoma, central retinal vein occlusion, hyperlipidemia, hypothyroidism, GERD, substance abuse presented to the ED with complaints of diarrhea, abdominal pain, URI symptoms, and generalized weakness.  Patient was diagnosed with COVID-19 with mild URI and GI symptoms.  Also elevated troponin likely demand ischemia.  During the hospitalization patient was started on 5 days of molnupiravir, supportive care and IV fluids.  PT/OT recommended SNF, TOC working on placement at this time.  Patient is very motivated to work with therapy, if she continues to improve.  Awaiting SNF placement. No new events overnight.  Sleeping comfortably.     Assessment & Plan    Assessment and Plan:   COVID-19 viral infection Continue with supportive care. She denies any sob or chest pain.  She reports cough , added tessalon. She remains on RA.    Elevated troponin Demand ischemia from viral infection.  EKG does not show any ischemic changes. Patient denies any chest pain Echocardiogram shows grade 2 diastolic dysfunction   Rhabdomyolysis:  Much improved.    Hypertension:  BP parameters are optimal.    Hypomagnesemia: replaced.    Type 2 dm: well controlled. CBG'S. CBG (last 3)  Recent Labs    12/28/21 2129 12/29/21 0752 12/29/21 1133  GLUCAP 139* 87 119*    Resume SSI.     Chronic normocytic anemia:  Stable hemoglobin.    Hypothyroidism:  Resume Synthroid.    Bilateral lower extremity chronic lymphedema Chronic.    Estimated body mass index is 49.72  kg/m as calculated from the following:   Height as of this encounter: 5\' 7"  (1.702 m).   Weight as of this encounter: 144 kg.  Code Status: FULL CODE.  DVT Prophylaxis:  enoxaparin (LOVENOX) injection 40 mg Start: 12/20/21 1000   Level of Care: Level of care: Med-Surg Family Communication: NONE AT BEDSIDE.   Disposition Plan:     Remains inpatient appropriate: Waiting for SNF placement.   Procedures:  NONE.   Consultants:   NONE.   Antimicrobials:   Anti-infectives (From admission, onward)    Start     Dose/Rate Route Frequency Ordered Stop   12/24/21 0000  nirmatrelvir/ritonavir EUA, renal dosing, (PAXLOVID) 10 x 150 MG & 10 x 100MG  TABS        2 tablet Oral 2 times daily 12/24/21 1002 12/29/21 2359   12/20/21 1000  molnupiravir EUA (LAGEVRIO) capsule 800 mg  Status:  Discontinued        4 capsule Oral 2 times daily 12/20/21 0347 12/20/21 0348   12/20/21 1000  nirmatrelvir/ritonavir EUA (PAXLOVID) 3 tablet  Status:  Discontinued        3 tablet Oral 2 times daily 12/20/21 0352  12/20/21 0355   12/20/21 1000  nirmatrelvir/ritonavir EUA (renal dosing) (PAXLOVID) 2 tablet        2 tablet Oral 2 times daily 12/20/21 0356 12/24/21 2117        Medications  Scheduled Meds:  amLODipine  5 mg Oral Daily   aspirin  81 mg Oral Daily   benzonatate  200 mg Oral TID   dextromethorphan-guaiFENesin  1 tablet Oral BID   enoxaparin (LOVENOX) injection  40 mg Subcutaneous Q24H   fluticasone  1 spray Each Nare Daily   insulin aspart  0-5 Units Subcutaneous QHS   insulin aspart  0-9 Units Subcutaneous TID WC   Ipratropium-Albuterol  1 puff Inhalation BID   levothyroxine  125 mcg Oral QAC breakfast   loratadine  10 mg Oral Daily   metoprolol succinate  50 mg Oral Daily   montelukast  10 mg Oral QHS   pantoprazole  40 mg Oral Daily   timolol  1 drop Both Eyes BID   Continuous Infusions: PRN Meds:.acetaminophen, guaiFENesin, hydrALAZINE, metoprolol tartrate, ondansetron (ZOFRAN) IV,  oxyCODONE, sodium chloride, traZODone    Subjective:   Mary [name] was seen and examined today. No new complaints.   Objective:   Vitals:   12/28/21 0811 12/28/21 1350 12/28/21 2134 12/29/21 0914  BP:  (!) 124/45 (!) 117/44   Pulse:  81 78   Resp:  18 18   Temp:  98.6 F (37 C) 98.3 F (36.8 C)   TempSrc:   Oral   SpO2: 95% 98% 100% 97%  Weight:      Height:        Intake/Output Summary (Last 24 hours) at 12/29/2021 1318 Last data filed at 12/29/2021 0837 Gross per 24 hour  Intake 360 ml  Output 1350 ml  Net -990 ml    Filed Weights   12/21/21 1101  Weight: (!) 144 kg     Exam General exam: Appears calm and comfortable  Respiratory system: Clear to auscultation. Respiratory effort normal. Cardiovascular system: S1 & S2 heard, RRR. No JVD,  Gastrointestinal system: Abdomen is nondistended, soft and nontender.  Central nervous system: Alert and oriented. No focal neurological deficits. Extremities: Symmetric 5 x 5 power. Skin: No rashes, lesions or ulcers Psychiatry: Mood & affect appropriate.       Data Reviewed:  I have personally reviewed following labs and imaging studies   CBC Lab Results  Component Value Date   WBC 8.7 12/24/2021   RBC 2.63 (L) 12/24/2021   HGB 7.7 (L) 12/24/2021   HCT 24.6 (L) 12/24/2021   MCV 93.5 12/24/2021   MCH 29.3 12/24/2021   PLT 290 12/24/2021   MCHC 31.3 12/24/2021   RDW 14.0 12/24/2021   LYMPHSABS 1.0 12/20/2021   MONOABS 0.6 12/20/2021   EOSABS 0.0 12/20/2021   BASOSABS 0.0 12/20/2021     Last metabolic panel Lab Results  Component Value Date   NA 140 12/28/2021   K 4.2 12/28/2021   CL 108 12/28/2021   CO2 25 12/28/2021   BUN 26 (H) 12/28/2021   CREATININE 1.13 (H) 12/28/2021   GLUCOSE 117 (H) 12/28/2021   GFRNONAA 51 (L) 12/28/2021   GFRAA 49 (L) 11/24/2019   CALCIUM 8.8 (L) 12/28/2021   PROT 6.7 12/20/2021   ALBUMIN 2.9 (L) 12/20/2021   LABGLOB 3.4 12/12/2020   AGRATIO 1.2 12/12/2020    BILITOT 1.6 (H) 12/20/2021   ALKPHOS 71 12/20/2021   AST 23 12/20/2021   ALT 12 12/20/2021   ANIONGAP 7  12/28/2021    CBG (last 3)  Recent Labs    12/28/21 2129 12/29/21 0752 12/29/21 1133  GLUCAP 139* 87 119*       Coagulation Profile: No results for input(s): "INR", "PROTIME" in the last 168 hours.   Radiology Studies: No results found.     Kathlen Mody M.D. [facility] 12/29/2021, 1:18 PM  Available via Epic secure chat 7am-7pm After 7 pm, please refer to night coverage provider listed on amion.

## 2021-12-30 LAB — GLUCOSE, CAPILLARY
Glucose-Capillary: 97 mg/dL (ref 70–99)
Glucose-Capillary: 122 mg/dL — ABNORMAL HIGH (ref 70–99)

## 2021-12-30 MED ORDER — BENZONATATE 200 MG PO CAPS: 200.0000 mg | ORAL_CAPSULE | Freq: Three times a day (TID) | ORAL | 0 refills | Status: DC

## 2021-12-30 NOTE — Plan of Care (Signed)
  Problem: Education: Goal: Ability to describe self-care measures that may prevent or decrease complications (Diabetes Survival Skills Education) will improve Outcome: Adequate for Discharge Goal: Individualized Educational Video(s) Outcome: Adequate for Discharge   Problem: Coping: Goal: Ability to adjust to condition or change in health will improve Outcome: Adequate for Discharge   Problem: Fluid Volume: Goal: Ability to maintain a balanced intake and output will improve Outcome: Adequate for Discharge   Problem: Health Behavior/Discharge Planning: Goal: Ability to identify and utilize available resources and services will improve Outcome: Adequate for Discharge Goal: Ability to manage health-related needs will improve Outcome: Adequate for Discharge   Problem: Metabolic: Goal: Ability to maintain appropriate glucose levels will improve Outcome: Adequate for Discharge   Problem: Nutritional: Goal: Maintenance of adequate nutrition will improve Outcome: Adequate for Discharge Goal: Progress toward achieving an optimal weight will improve Outcome: Adequate for Discharge   Problem: Skin Integrity: Goal: Risk for impaired skin integrity will decrease Outcome: Adequate for Discharge   Problem: Tissue Perfusion: Goal: Adequacy of tissue perfusion will improve Outcome: Adequate for Discharge   Problem: Education: Goal: Knowledge of risk factors and measures for prevention of condition will improve Outcome: Adequate for Discharge   Problem: Coping: Goal: Psychosocial and spiritual needs will be supported Outcome: Adequate for Discharge   Problem: Respiratory: Goal: Will maintain a patent airway Outcome: Adequate for Discharge Goal: Complications related to the disease process, condition or treatment will be avoided or minimized Outcome: Adequate for Discharge   Problem: Education: Goal: Knowledge of General Education information will improve Description: Including  pain rating scale, medication(s)/side effects and non-pharmacologic comfort measures Outcome: Adequate for Discharge   Problem: Health Behavior/Discharge Planning: Goal: Ability to manage health-related needs will improve Outcome: Adequate for Discharge   Problem: Clinical Measurements: Goal: Ability to maintain clinical measurements within normal limits will improve Outcome: Adequate for Discharge Goal: Will remain free from infection Outcome: Adequate for Discharge Goal: Diagnostic test results will improve Outcome: Adequate for Discharge Goal: Respiratory complications will improve Outcome: Adequate for Discharge Goal: Cardiovascular complication will be avoided Outcome: Adequate for Discharge   Problem: Activity: Goal: Risk for activity intolerance will decrease Outcome: Adequate for Discharge   Problem: Nutrition: Goal: Adequate nutrition will be maintained Outcome: Adequate for Discharge   Problem: Coping: Goal: Level of anxiety will decrease Outcome: Adequate for Discharge   Problem: Elimination: Goal: Will not experience complications related to bowel motility Outcome: Adequate for Discharge Goal: Will not experience complications related to urinary retention Outcome: Adequate for Discharge   Problem: Pain Managment: Goal: General experience of comfort will improve Outcome: Adequate for Discharge   Problem: Safety: Goal: Ability to remain free from injury will improve Outcome: Adequate for Discharge   Problem: Skin Integrity: Goal: Risk for impaired skin integrity will decrease Outcome: Adequate for Discharge   

## 2021-12-30 NOTE — Discharge Summary (Signed)
Physician Discharge Summary   Patient: Mary [name] MRN: [id number] DOB: 04-11-1945  Admit date:     12/19/2021  Discharge date: 12/30/21  Discharge Physician: Hosie Poisson   PCP: Merlene Laughter, MD   Recommendations at discharge:  Please follow up with PCP in one week.  Please follow up with cbc and bmp in one week.   Discharge Diagnoses: Principal Problem:   COVID-19 virus infection Active Problems:   Type 2 diabetes mellitus (HCC)   Diarrhea   Chronic anemia   Elevated troponin   Elevated CK   AKI (acute kidney injury) ([place])   Serum total bilirubin elevated   Hospital Course: 76 y.o. female with medical history significant of type 2 diabetes, hypertension, diabetic and hypertensive retinopathy, glaucoma, central retinal vein occlusion, hyperlipidemia, hypothyroidism, GERD, substance abuse presented to the ED with complaints of diarrhea, abdominal pain, URI symptoms, and generalized weakness.  Patient was diagnosed with COVID-19 with mild URI and GI symptoms.  Also elevated troponin likely demand ischemia.  During the hospitalization patient was started on 5 days of molnupiravir, supportive care and IV fluids.  PT/OT recommended SNF, TOC working on placement at this time.  Patient is very motivated to work with therapy, if she continues to improve.  Awaiting SNF placement.   Assessment and Plan:   COVID-19 viral infection Continue with supportive care. She denies any sob or chest pain.  She reports cough , added tessalon. She remains on RA.      Elevated troponin Demand ischemia from viral infection.  EKG does not show any ischemic changes. Patient denies any chest pain Echocardiogram shows grade 2 diastolic dysfunction     Rhabdomyolysis:  Much improved.      Hypertension:  BP parameters are optimal.      Hypomagnesemia: replaced.      Type 2 dm: well controlled. CBG'S.      Chronic normocytic anemia:  Stable hemoglobin.      Hypothyroidism:   Resume Synthroid.      Bilateral lower extremity chronic lymphedema Chronic.      Estimated body mass index is 49.72 kg/m as calculated from the following:   Height as of this encounter: _0  (1.702 m).   Weight as of this encounter: 144 kg.      Consultants: none.  Procedures performed: none.   Disposition: Skilled nursing facility Diet recommendation:  Discharge Diet Orders (From admission, onward)     Start     Ordered   12/30/21 0000  Diet - low sodium heart healthy        12/30/21 1017           Carb modified diet DISCHARGE MEDICATION: Allergies as of 12/30/2021   No Known Allergies      Medication List     TAKE these medications    amLODipine 5 MG tablet Commonly known as: NORVASC Take 1 tablet (5 mg total) by mouth daily.   aspirin 81 MG chewable tablet Chew 81 mg by mouth daily.   Assure Comfort Lancets 28G Misc   OneTouch Delica Lancets 51O Misc Use as directed to check blood sugars 2 times per day dx: e11.65   Bag Balm Oint Apply 1 Application topically 4 (four) times daily as needed (itching).   benzonatate 200 MG capsule Commonly known as: TESSALON Take 1 capsule (200 mg total) by mouth 3 (three) times daily.   brimonidine 0.2 % ophthalmic solution Commonly known as: ALPHAGAN INSTILL 1 DROP INTO BOTH EYES TWICE A  DAY What changed: when to take this   cetirizine 10 MG tablet Commonly known as: ZYRTEC Take 1 tablet (10 mg total) by mouth daily.   dorzolamide-timolol 2-0.5 % ophthalmic solution Commonly known as: COSOPT Place 1 drop into the right eye 2 (two) times daily. What changed: how to take this   fluticasone 50 MCG/ACT nasal spray Commonly known as: FLONASE SPRAY 1 SPRAY INTO EACH NOSTRIL EVERY DAY What changed: See the new instructions.   furosemide 40 MG tablet Commonly known as: LASIX TAKE 1 TABLET BY MOUTH  DAILY   HYDROcodone-acetaminophen 10-325 MG tablet Commonly known as: NORCO Take 1 tablet by mouth 5  (five) times daily as needed for moderate pain.   Ipratropium-Albuterol 20-100 MCG/ACT Aers respimat Commonly known as: COMBIVENT Inhale 1 puff into the lungs 2 (two) times daily.   levothyroxine 125 MCG tablet Commonly known as: SYNTHROID Take 125 mcg by mouth daily before breakfast.   loperamide 2 MG capsule Commonly known as: IMODIUM Take 2 mg by mouth every 6 (six) hours as needed for diarrhea or loose stools.   metoprolol succinate 50 MG 24 hr tablet Commonly known as: TOPROL-XL TAKE 1 TABLET BY MOUTH  DAILY   montelukast 10 MG tablet Commonly known as: SINGULAIR Take 10 mg by mouth at bedtime.   nystatin cream Commonly known as: MYCOSTATIN Apply 1 Application topically 2 (two) times daily as needed for dry skin.   omeprazole 20 MG capsule Commonly known as: PRILOSEC TAKE 1 CAPSULE BY MOUTH  DAILY   OneTouch Verio Flex System w/Device Kit Use as directed to check blood sugars 2 times per day dx: e11.65   OneTouch Verio test strip Generic drug: glucose blood Use as directed to check blood sugars 2 times per day dx: e11.65   pravastatin 40 MG tablet Commonly known as: PRAVACHOL TAKE 1 TABLET BY MOUTH EVERY DAY What changed: how much to take   telmisartan 80 MG tablet Commonly known as: MICARDIS TAKE 1 TABLET BY MOUTH  DAILY   timolol 0.25 % ophthalmic solution Commonly known as: BETIMOL Place 1 drop into both eyes 2 (two) times daily.   triamcinolone cream 0.1 % Commonly known as: KENALOG APPLY TO AFFECTED AREA TWICE A DAY What changed: See the new instructions.   Vitamin D 50 MCG (2000 UT) Caps Take 2,000 Units by mouth daily at 6 (six) AM.       ASK your doctor about these medications    nirmatrelvir/ritonavir EUA (renal dosing) 10 x 150 MG & 10 x 100MG Tabs Commonly known as: PAXLOVID Take 2 tablets by mouth 2 (two) times daily for 5 days. Take nirmatrelvir (150 mg) one tablet twice daily for 5 days and ritonavir (100 mg) one tablet twice daily  for 5 days. Ask about: Should I take this medication?        Contact information for follow-up providers     Merlene Laughter, MD. Schedule an appointment as soon as possible for a visit in 1 week(s).   Specialty: Internal Medicine Contact information: [address] [address] [phone]              Contact information for after-discharge care     Destination     [facility] Preferred SNF .   Service: Skilled Nursing Contact information: [address] [place] [phone]                    Discharge  Exam: Filed Weights   12/21/21 1101  Weight: (!) 144 kg   General exam: Appears calm and comfortable  Respiratory system: Clear to auscultation. Respiratory effort normal. Cardiovascular system: S1 & S2 heard, RRR. No JVD,  Gastrointestinal system: Abdomen is nondistended, soft and nontender.  Central nervous system: Alert and oriented. No focal neurological deficits. Extremities: Symmetric 5 x 5 power. Skin: No rashes, lesions or ulcers Psychiatry: Mood & affect appropriate.    Condition at discharge: fair  The results of significant diagnostics from this hospitalization (including imaging, microbiology, ancillary and laboratory) are listed below for reference.   Imaging Studies: ECHOCARDIOGRAM COMPLETE  Result Date: 12/20/2021    ECHOCARDIOGRAM REPORT   Patient Name:   Mary [name] Date of Exam: 12/20/2021 Medical Rec #:  [id number]        Height:       67.0 in Accession #:    [id number]       Weight:       320.0 lb Date of Birth:  12-Apr-1945        BSA:          2.469 m Patient Age:    48 years         BP:           161/82 mmHg Patient Gender: F                HR:           76 bpm. Exam Location:  Inpatient Procedure: 2D Echo, Cardiac Doppler and Color Doppler Indications:    Elevated Troponin  History:        Patient has no prior history of Echocardiogram examinations.                  Risk Factors:Diabetes, Dyslipidemia and Hypertension.  Sonographer:    Bernadene Person RDCS Referring Phys: [id number] [place]  1. Left ventricular ejection fraction, by estimation, is 50 to 55%. The left ventricle has low normal function. The left ventricle demonstrates global hypokinesis. There is mild concentric left ventricular hypertrophy. Left ventricular diastolic parameters are consistent with Grade II diastolic dysfunction (pseudonormalization).  2. Right ventricular systolic function is normal. The right ventricular size is mildly enlarged. There is mildly elevated pulmonary artery systolic pressure.  3. Left atrial size was mildly dilated.  4. The mitral valve is normal in structure. Mild mitral valve regurgitation. No evidence of mitral stenosis.  5. Tricuspid valve regurgitation is moderate.  6. The aortic valve is normal in structure. Aortic valve regurgitation is trivial. Aortic valve sclerosis/calcification is present, without any evidence of aortic stenosis.  7. The inferior vena cava is normal in size with <50% respiratory variability, suggesting right atrial pressure of 8 mmHg. FINDINGS  Left Ventricle: Left ventricular ejection fraction, by estimation, is 50 to 55%. The left ventricle has low normal function. The left ventricle demonstrates global hypokinesis. The left ventricular internal cavity size was normal in size. There is mild concentric left ventricular hypertrophy. Left ventricular diastolic parameters are consistent with Grade II diastolic dysfunction (pseudonormalization). Right Ventricle: The right ventricular size is mildly enlarged. No increase in right ventricular wall thickness. Right ventricular systolic function is normal. There is mildly elevated pulmonary artery systolic pressure. The tricuspid regurgitant velocity is 2.93 m/s, and with an assumed right atrial pressure of 8 mmHg, the estimated right ventricular systolic pressure is 62.9 mmHg. Left Atrium:  Left atrial size was mildly dilated. Right Atrium: Right atrial size was normal  in size. Pericardium: There is no evidence of pericardial effusion. Mitral Valve: The mitral valve is normal in structure. Mild mitral annular calcification. Mild mitral valve regurgitation. No evidence of mitral valve stenosis. Tricuspid Valve: The tricuspid valve is normal in structure. Tricuspid valve regurgitation is moderate . No evidence of tricuspid stenosis. Aortic Valve: The aortic valve is normal in structure. Aortic valve regurgitation is trivial. Aortic valve sclerosis/calcification is present, without any evidence of aortic stenosis. Pulmonic Valve: The pulmonic valve was normal in structure. Pulmonic valve regurgitation is not visualized. No evidence of pulmonic stenosis. Aorta: The aortic root is normal in size and structure. Venous: The inferior vena cava is normal in size with less than 50% respiratory variability, suggesting right atrial pressure of 8 mmHg. IAS/Shunts: No atrial level shunt detected by color flow Doppler.  LEFT VENTRICLE PLAX 2D LVIDd:         4.70 cm      Diastology LVIDs:         3.20 cm      LV e' medial:    5.68 cm/s LV PW:         1.00 cm      LV E/e' medial:  19.4 LV IVS:        1.00 cm      LV e' lateral:   6.89 cm/s LVOT diam:     2.20 cm      LV E/e' lateral: 16.0 LV SV:         73 LV SV Index:   30 LVOT Area:     3.80 cm  LV Volumes (MOD) LV vol d, MOD A2C: 131.0 ml LV vol d, MOD A4C: 137.0 ml LV vol s, MOD A2C: 57.3 ml LV vol s, MOD A4C: 59.3 ml LV SV MOD A2C:     73.7 ml LV SV MOD A4C:     137.0 ml LV SV MOD BP:      74.9 ml RIGHT VENTRICLE RV S prime:     10.80 cm/s TAPSE (M-mode): 2.0 cm LEFT ATRIUM             Index        RIGHT ATRIUM           Index LA diam:        4.30 cm 1.74 cm/m   RA Area:     19.90 cm LA Vol (A2C):   91.6 ml 37.09 ml/m  RA Volume:   60.00 ml  24.30 ml/m LA Vol (A4C):   86.0 ml 34.83 ml/m LA Biplane Vol: 90.4 ml 36.61 ml/m  AORTIC VALVE LVOT Vmax:   91.20  cm/s LVOT Vmean:  61.600 cm/s LVOT VTI:    0.192 m  AORTA Ao Root diam: 3.20 cm Ao Asc diam:  3.40 cm MITRAL VALVE                TRICUSPID VALVE MV Area (PHT): 4.06 cm     TR Peak grad:   34.3 mmHg MV Decel Time: 187 msec     TR Vmax:        293.00 cm/s MV E velocity: 110.00 cm/s MV A velocity: 107.00 cm/s  SHUNTS MV E/A ratio:  1.03         Systemic VTI:  0.19 m                             Systemic Diam: 2.20 cm Kardie Tobb DO Electronically  signed by Berniece Salines DO Signature Date/Time: 12/20/2021/10:54:00 AM    Final    DG Chest Portable 1 View  Result Date: 12/19/2021 CLINICAL DATA:  Weakness, diarrhea, abdominal pain, flu-like symptoms for week. EXAM: PORTABLE CHEST 1 VIEW COMPARISON:  08/08/2019. FINDINGS: The heart is enlarged and the mediastinal contour is stable. No consolidation, effusion, or pneumothorax. Degenerative changes are present in the thoracic spine. No acute osseous abnormality. IMPRESSION: Cardiomegaly with no active disease. Electronically Signed   By: Brett Fairy M.D.   On: 12/19/2021 22:39   Intravitreal Injection, Pharmacologic Agent - OD - Right Eye  Result Date: 12/14/2021 Time Out 12/14/2021. 11:18 AM. Confirmed correct patient, procedure, site, and patient consented. Anesthesia Topical anesthesia was used. Anesthetic medications included Lidocaine 2%, Proparacaine 0.5%. Procedure Preparation included 5% betadine to ocular surface, eyelid speculum. A supplied (32g) needle was used. Injection: 1.25 mg Bevacizumab 1.38m/0.05ml   Route: Intravitreal, Site: Right Eye   NDC: 50242-060-01, Lot:: [id number] Expiration date: 01/07/2022 Post-op Post injection exam found visual acuity of at least counting fingers, no retinal detachment, perfused optic nerve. The patient tolerated the procedure well. There were no complications. The patient received written and verbal post procedure care education.   OCT, Retina - OU - Both Eyes  Result Date: 12/14/2021 Right Eye Quality was good.  Central Foveal Thickness: 187. Progression has worsened. Findings include normal foveal contour, no SRF, intraretinal hyper-reflective material, intraretinal fluid, vitreomacular adhesion (Mild interval increase in IRF temporal macula, trace vitreous opacities). Left Eye Quality was borderline. Central Foveal Thickness: 254. Progression has been stable. Findings include no SRF, abnormal foveal contour, subretinal hyper-reflective material, epiretinal membrane, intraretinal fluid, pigment epithelial detachment, outer retinal atrophy, preretinal fibrosis (Scattered IRF/cystic changes, persistent central ORA / SRHM). Notes *Images captured and stored on drive Diagnosis / Impression: OD: Mild interval increase in IRF temporal macula, trace vitreous opacities OS: Scattered IRF/cystic changes, persistent central ORA / SRHM Clinical management: See below Abbreviations: NFP - Normal foveal profile. CME - cystoid macular edema. PED - pigment epithelial detachment. IRF - intraretinal fluid. SRF - subretinal fluid. EZ - ellipsoid zone. ERM - epiretinal membrane. ORA - outer retinal atrophy. ORT - outer retinal tubulation. SRHM - subretinal hyper-reflective material    Microbiology: Results for orders placed or performed during the hospital encounter of 12/19/21  Resp Panel by RT-PCR (Flu A&B, Covid) Anterior Nasal Swab     Status: Abnormal   Collection Time: 12/19/21 10:15 PM   Specimen: Anterior Nasal Swab  Result Value Ref Range Status   SARS Coronavirus 2 by RT PCR POSITIVE (A) NEGATIVE Final    Comment: (NOTE) SARS-CoV-2 target nucleic acids are DETECTED.  The SARS-CoV-2 RNA is generally detectable in upper respiratory specimens during the acute phase of infection. Positive results are indicative of the presence of the identified virus, but do not rule out bacterial infection or co-infection with other pathogens not detected by the test. Clinical correlation with patient history and other diagnostic  information is necessary to determine patient infection status. The expected result is Negative.  Fact Sheet for Patients: hEntrepreneurPulse.com.au Fact Sheet for Healthcare Providers: hIncredibleEmployment.be This test is not yet approved or cleared by the [place] and  has been authorized for detection and/or diagnosis of SARS-CoV-2 by FDA under an Emergency Use Authorization (EUA).  This EUA will remain in effect (meaning this test can be used) for the duration of  the COVID-19 declaration under Section 564(b)(1) of the A ct, 21 U.S.C. section  360bbb-3(b)(1), unless the authorization is terminated or revoked sooner.     Influenza A by PCR NEGATIVE NEGATIVE Final   Influenza B by PCR NEGATIVE NEGATIVE Final    Comment: (NOTE) The Xpert Xpress SARS-CoV-2/FLU/RSV plus assay is intended as an aid in the diagnosis of influenza from Nasopharyngeal swab specimens and should not be used as a sole basis for treatment. Nasal washings and aspirates are unacceptable for Xpert Xpress SARS-CoV-2/FLU/RSV testing.  Fact Sheet for Patients: EntrepreneurPulse.com.au  Fact Sheet for Healthcare Providers: IncredibleEmployment.be  This test is not yet approved or cleared by the [place] FDA and has been authorized for detection and/or diagnosis of SARS-CoV-2 by FDA under an Emergency Use Authorization (EUA). This EUA will remain in effect (meaning this test can be used) for the duration of the COVID-19 declaration under Section 564(b)(1) of the Act, 21 U.S.C. section 360bbb-3(b)(1), unless the authorization is terminated or revoked.  Performed at [facility], [place] [address], [place], [place] [address]   Culture, blood (x 2)     Status: None   Collection Time: 12/20/21 11:50 PM   Specimen: BLOOD  Result Value Ref Range Status   Specimen Description   Final    BLOOD BLOOD RIGHT  HAND Performed at [place] [address] [place], [place] [address]    Special Requests   Final    BOTTLES DRAWN AEROBIC AND ANAEROBIC Blood Culture results may not be optimal due to an inadequate volume of blood received in culture bottles Performed at [place] [address] [place], [place] [address]    Culture   Final    NO GROWTH 5 DAYS Performed at [facility], [place] [address], [place], [place] [address]    Report Status 12/26/2021 FINAL  Final  Culture, blood (x 2)     Status: None   Collection Time: 12/20/21 11:50 PM   Specimen: BLOOD  Result Value Ref Range Status   Specimen Description   Final    BLOOD BLOOD LEFT HAND Performed at [place] [address] [place], [place] [address]    Special Requests   Final    BOTTLES DRAWN AEROBIC AND ANAEROBIC Blood Culture results may not be optimal due to an inadequate volume of blood received in culture bottles Performed at [place] [address] [place], [place] [address]    Culture   Final    NO GROWTH 5 DAYS Performed at [facility], [place] [address], [place], [place] [address]    Report Status 12/26/2021 FINAL  Final  C Difficile Quick Screen w PCR reflex     Status: None   Collection Time: 12/22/21 10:46 AM   Specimen: STOOL  Result Value Ref Range Status   C Diff antigen NEGATIVE NEGATIVE Final   C Diff toxin NEGATIVE NEGATIVE Final   C Diff interpretation No C. difficile detected.  Final    Comment: Performed at [facility], [place] [address] [place], [place] [address]  Gastrointestinal Panel by PCR , Stool     Status: None   Collection Time: 12/22/21 10:46 AM   Specimen: STOOL  Result Value Ref Range Status   Campylobacter species NOT DETECTED NOT DETECTED Final   Plesimonas shigelloides NOT DETECTED NOT DETECTED Final   Salmonella species NOT DETECTED NOT DETECTED Final   Yersinia  enterocolitica NOT DETECTED NOT DETECTED Final   Vibrio species NOT DETECTED NOT DETECTED Final  Vibrio cholerae NOT DETECTED NOT DETECTED Final   Enteroaggregative E coli (EAEC) NOT DETECTED NOT DETECTED Final   Enteropathogenic E coli (EPEC) NOT DETECTED NOT DETECTED Final   Enterotoxigenic E coli (ETEC) NOT DETECTED NOT DETECTED Final   Shiga like toxin producing E coli (STEC) NOT DETECTED NOT DETECTED Final   Shigella/Enteroinvasive E coli (EIEC) NOT DETECTED NOT DETECTED Final   Cryptosporidium NOT DETECTED NOT DETECTED Final   Cyclospora cayetanensis NOT DETECTED NOT DETECTED Final   Entamoeba histolytica NOT DETECTED NOT DETECTED Final   Giardia lamblia NOT DETECTED NOT DETECTED Final   Adenovirus F40/41 NOT DETECTED NOT DETECTED Final   Astrovirus NOT DETECTED NOT DETECTED Final   Norovirus GI/GII NOT DETECTED NOT DETECTED Final   Rotavirus A NOT DETECTED NOT DETECTED Final   Sapovirus (I, II, IV, and V) NOT DETECTED NOT DETECTED Final    Comment: Performed at [facility], [place] [place], [place] [address]    Labs: CBC: Recent Labs  Lab 12/24/21 0538  WBC 8.7  HGB 7.7*  HCT 24.6*  MCV 93.5  PLT 092   Basic Metabolic Panel: Recent Labs  Lab 12/24/21 0538 12/26/21 0035 12/28/21 1039 12/28/21 2359  NA 138 138 138 140  K 3.9 3.9 4.0 4.2  CL 108 107 107 108  CO2 _0 GLUCOSE 84 108* 141* 117*  BUN _1 26*  CREATININE 0.98 1.09* 0.99 1.13*  CALCIUM 8.5* 8.6* 8.9 8.8*  MG 1.9 1.6* 1.8 1.8   Liver Function Tests: No results for input(s): "AST", "ALT", "ALKPHOS", "BILITOT", "PROT", "ALBUMIN" in the last 168 hours. CBG: Recent Labs  Lab 12/29/21 0752 12/29/21 1133 12/29/21 1557 12/29/21 2049 12/30/21 0756  GLUCAP 87 119* 118* 98 97    Discharge time spent: 39 minutes  Signed: Hosie Poisson, MD [facility] 12/30/2021

## 2021-12-30 NOTE — TOC Transition Note (Addendum)
Transition of Care [facility]) - CM/SW Discharge Note   Patient Details  Name: Mary [name] MRN: [id number] Date of Birth: 02/06/1945  [facility] CM/SW Contact:  Adrian Prows, RN Phone Number: [phone] 12/30/2021, 10:33 AM   Clinical Narrative:    Pt d/c to [facility]; @ 1026 LVM at Transportation at [facility] [phone], option 2); @ 1027 attempted to reach on call nurse; spoke w/ Operator, Corrie Dandy [phone], option 1); she will contact them and requests this CM to call back if a callback has not been received in 30 minutes; d/c summary and SNF transfer report sent to facility via [facility]; awaiting callback from [facility].  -1044Cordelia Pen, on call nurse says she will call back w/ transportation info.  -1050- Cordelia Pen, on call nurse says the on call provider will be calling shortly; awaiting return call.  -1125- contacted by Neta Ehlers at [facility] and she says she will notify the driver for pt pickup.  -0102- notified by Joni Reining at [facility] that driver is otw to pickup pt; Pattricia Boss, RN notified; no TOC needs.  Final next level of care: Skilled Nursing Facility Barriers to Discharge: No Barriers Identified   Patient Goals and CMS Choice Patient states their goals for this hospitalization and ongoing recovery are:: To go home CMS Medicare.gov Compare Post Acute Care list provided to:: Patient Choice offered to / list presented to : Patient  Discharge Placement              Patient chooses bed at: [facility] Patient to be transferred to facility by: [facility] Name of family member notified: [name] (dtr) [phone] Patient and family notified of of transfer: 12/30/21  Discharge Plan and Services In-house Referral: NA Discharge Planning Services: CM Consult Post Acute Care Choice: Skilled Nursing Facility          DME Arranged: N/A DME Agency: NA                  Social Determinants of Health (SDOH) Interventions Food Insecurity  Interventions: Intervention Not Indicated Housing Interventions: Intervention Not Indicated Utilities Interventions: Intervention Not Indicated   Readmission Risk Interventions    12/28/2021   12:21 PM 12/21/2021    3:27 PM 12/21/2021   12:27 PM  Readmission Risk Prevention Plan  Post Dischage Appt Complete Complete Complete  Medication Screening Complete Complete Complete  Transportation Screening Complete Complete Complete

## 2021-12-30 NOTE — TOC Progression Note (Addendum)
Transition of Care [facility]) - Progression Note    Patient Details  Name: Mary [name] MRN: [id number] Date of Birth: 1945/11/18  Transition of Care [facility]) CM/SW Contact  Adrian Prows, RN Phone [phone] 12/30/2021, 10:15 AM  Clinical Narrative:    Pt to d/c to [facility] today; spoke w/ Lowella Bandy at [facility]; pt given RM # [phone] D; call report # [phone]; contacted pt's dtr Gustavo Lah [phone]) and she agrees to pt's d/c to facility; pt to be transported by [facility]; LVM for [name] at [phone] to arrange transportation; awaiting d/c summary;    Expected Discharge Plan: Skilled Nursing Facility Barriers to Discharge: SNF Covid  Expected Discharge Plan and Services Expected Discharge Plan: Skilled Nursing Facility In-house Referral: NA Discharge Planning Services: CM Consult Post Acute Care Choice: Skilled Nursing Facility Living arrangements for the past 2 months: Single Family Home                 DME Arranged: N/A DME Agency: NA                   Social Determinants of Health (SDOH) Interventions Food Insecurity Interventions: Intervention Not Indicated Housing Interventions: Intervention Not Indicated Utilities Interventions: Intervention Not Indicated  Readmission Risk Interventions    12/28/2021   12:21 PM 12/21/2021    3:27 PM 12/21/2021   12:27 PM  Readmission Risk Prevention Plan  Post Dischage Appt Complete Complete Complete  Medication Screening Complete Complete Complete  Transportation Screening Complete Complete Complete

## 2022-01-18 ENCOUNTER — Encounter (INDEPENDENT_AMBULATORY_CARE_PROVIDER_SITE_OTHER): Payer: Medicare (Managed Care) | Admitting: Ophthalmology

## 2022-02-06 ENCOUNTER — Encounter (INDEPENDENT_AMBULATORY_CARE_PROVIDER_SITE_OTHER): Payer: Medicare (Managed Care) | Admitting: Ophthalmology

## 2022-02-07 ENCOUNTER — Ambulatory Visit: Payer: Medicare (Managed Care) | Admitting: Vascular Surgery

## 2022-02-07 ENCOUNTER — Ambulatory Visit (HOSPITAL_COMMUNITY): Admit: 2022-02-07 | Payer: Medicare (Managed Care)

## 2022-02-07 NOTE — Progress Notes (Signed)
[facility] - Clinic Note  02/08/2022     CHIEF COMPLAINT Patient presents for Retina Follow Up    HISTORY OF PRESENT ILLNESS: Mary [name] is a 77 y.o. female who presents to the clinic today for:   HPI     Retina Follow Up   Patient presents with  CRVO/BRVO.  In both eyes.  This started 5 weeks ago.  Duration of 5 weeks.  Since onset it is stable.  I, the attending physician,  performed the HPI with the patient and updated documentation appropriately.        Comments   5 week retina follow up CRVO IVA OD pt states no vision changes noticed she denies any flashers or floaters       Last edited by Rennis Chris, MD on 02/08/2022  4:04 PM.    Pt is delayed to follow up from 5 weeks to 8 weeks due to being hospitalized for covid  Referring physician: Angela Cox, MD [address] [place],  [place] [address]  HISTORICAL INFORMATION:   Selected notes from the MEDICAL RECORD NUMBER Transferred care from Dr. Ashley Royalty   CURRENT MEDICATIONS: Current Outpatient Medications (Ophthalmic Drugs)  Medication Sig   brimonidine (ALPHAGAN) 0.2 % ophthalmic solution INSTILL 1 DROP INTO BOTH EYES TWICE A DAY (Patient taking differently: Place 1 drop into both eyes 3 (three) times daily.)   dorzolamide-timolol (COSOPT) 22.3-6.8 MG/ML ophthalmic solution Place 1 drop into the right eye 2 (two) times daily. (Patient taking differently: Place 1 drop into both eyes 2 (two) times daily.)   timolol (BETIMOL) 0.25 % ophthalmic solution Place 1 drop into both eyes 2 (two) times daily.   No current facility-administered medications for this visit. (Ophthalmic Drugs)   Current Outpatient Medications (Other)  Medication Sig   amLODipine (NORVASC) 5 MG tablet Take 1 tablet (5 mg total) by mouth daily.   aspirin 81 MG chewable tablet Chew 81 mg by mouth daily.   Assure Comfort Lancets 28G MISC    benzonatate (TESSALON) 200 MG capsule Take 1 capsule (200 mg total) by  mouth 3 (three) times daily.   Blood Glucose Monitoring Suppl (ONETOUCH VERIO FLEX SYSTEM) w/Device KIT Use as directed to check blood sugars 2 times per day dx: e11.65   cetirizine (ZYRTEC) 10 MG tablet Take 1 tablet (10 mg total) by mouth daily.   Cholecalciferol (VITAMIN D) 50 MCG (2000 UT) CAPS Take 2,000 Units by mouth daily at 6 (six) AM.   Emollient (BAG BALM) OINT Apply 1 Application topically 4 (four) times daily as needed (itching).   fluticasone (FLONASE) 50 MCG/ACT nasal spray SPRAY 1 SPRAY INTO EACH NOSTRIL EVERY DAY (Patient taking differently: Place 1 spray into both nostrils daily.)   furosemide (LASIX) 40 MG tablet TAKE 1 TABLET BY MOUTH  DAILY (Patient not taking: Reported on 12/20/2021)   glucose blood (ONETOUCH VERIO) test strip Use as directed to check blood sugars 2 times per day dx: e11.65   HYDROcodone-acetaminophen (NORCO) 10-325 MG tablet Take 1 tablet by mouth 5 (five) times daily as needed for moderate pain.   Ipratropium-Albuterol (COMBIVENT) 20-100 MCG/ACT AERS respimat Inhale 1 puff into the lungs 2 (two) times daily.   levothyroxine (SYNTHROID) 125 MCG tablet Take 125 mcg by mouth daily before breakfast.   loperamide (IMODIUM) 2 MG capsule Take 2 mg by mouth every 6 (six) hours as needed for diarrhea or loose stools.   metoprolol succinate (TOPROL-XL) 50 MG 24 hr tablet  TAKE 1 TABLET BY MOUTH  DAILY (Patient taking differently: Take 50 mg by mouth daily.)   montelukast (SINGULAIR) 10 MG tablet Take 10 mg by mouth at bedtime.   nystatin cream (MYCOSTATIN) Apply 1 Application topically 2 (two) times daily as needed for dry skin.   omeprazole (PRILOSEC) 20 MG capsule TAKE 1 CAPSULE BY MOUTH  DAILY (Patient taking differently: Take 20 mg by mouth daily.)   OneTouch Delica Lancets 33G MISC Use as directed to check blood sugars 2 times per day dx: e11.65   pravastatin (PRAVACHOL) 40 MG tablet TAKE 1 TABLET BY MOUTH EVERY DAY (Patient taking differently: Take 80 mg by mouth  daily.)   telmisartan (MICARDIS) 80 MG tablet TAKE 1 TABLET BY MOUTH  DAILY (Patient taking differently: Take 80 mg by mouth daily.)   triamcinolone (KENALOG) 0.1 % APPLY TO AFFECTED AREA TWICE A DAY (Patient taking differently: Apply 1 Application topically 2 (two) times daily as needed (rash).)   No current facility-administered medications for this visit. (Other)   REVIEW OF SYSTEMS: ROS   Positive for: Skin, Endocrine, Cardiovascular, Eyes Negative for: Constitutional, Gastrointestinal, Neurological, Genitourinary, Musculoskeletal, HENT, Respiratory, Psychiatric, Allergic/Imm, Heme/Lymph Last edited by Etheleen Mayhew, COT on 02/08/2022  1:49 PM.     ALLERGIES No Known Allergies  PAST MEDICAL HISTORY Past Medical History:  Diagnosis Date   Arthritis    osteoarthritis   Cataract    OS   Diabetes mellitus    Insulin dependent   Diabetic retinopathy (HCC)    NPDR OU   Glaucoma    POAG OD   H/O cardiovascular stress test    pt. reports 10/13- was normal per pt.    Hyperlipemia    Hypertension    Hypertensive retinopathy    OU   Past Surgical History:  Procedure Laterality Date   CARPAL TUNNEL RELEASE Bilateral    CATARACT EXTRACTION Right 10/05/2018   Dr. Alben Spittle   EYE SURGERY Right    Cat Sx   KNEE ARTHROSCOPY     right   QUADRICEPS TENDON REPAIR  02/17/2012   Procedure: REPAIR QUADRICEP TENDON;  Surgeon: Dannielle Huh, MD;  Location[facility] OR;  Service: Orthopedics;  Laterality: Right;  right quadriceps repair with latera release   QUADRICEPS TENDON REPAIR Right 05/15/2012   Procedure: REPAIR QUADRICEP TENDON;  Surgeon: Dannielle Huh, MD;  Location: [facility] OR;  Service: Orthopedics;  Laterality: Right;   TOTAL KNEE ARTHROPLASTY  10/21/2011   Procedure: TOTAL KNEE ARTHROPLASTY;  Surgeon: Raymon Mutton, MD;  Location: [facility] OR;  Service: Orthopedics;  Laterality: Right;   TUBAL LIGATION     FAMILY HISTORY Family History  Problem Relation Age of Onset   Hypertension  Mother    Heart Problems Father    Gout Father    Arthritis Father    SOCIAL HISTORY Social History   Tobacco Use   Smoking status: Former    Packs/day: 1.00    Years: 30.00    Total pack years: 30.00    Types: Cigarettes    Start date: 01/15/1967    Quit date: 06/28/2014    Years since quitting: 7.6   Smokeless tobacco: Never  Vaping Use   Vaping Use: Never used  Substance Use Topics   Alcohol use: No   Drug use: Yes    Types: Hydrocodone       OPHTHALMIC EXAM: Base Eye Exam     Visual Acuity (Snellen - Linear)       Right Left  Dist [place] 20/100 -2 HM   Dist ph [place] NI NI         Tonometry (Tonopen, 1:51 PM)       Right Left   Pressure 13 12         Pupils       Pupils Dark Light Shape React APD   Right PERRL 2 1 Round Sluggish None   Left PERRL 2 1 Round Sluggish None         Visual Fields       Left Right   Restrictions Total superior temporal, inferior temporal, superior nasal, inferior nasal deficiencies          Extraocular Movement       Right Left    Full, Ortho Full, Ortho         Neuro/Psych     Oriented x3: Yes   Mood/Affect: Normal         Dilation     Both eyes: 2.5% Phenylephrine @ 1:51 PM           Slit Lamp and Fundus Exam     Slit Lamp Exam       Right Left   Lids/Lashes Dermatochalasis - upper lid, mild Meibomian gland dysfunction Dermatochalasis - upper lid, mild Meibomian gland dysfunction   Conjunctiva/Sclera Melanosis Melanosis   Cornea Trace, Punctate epithelial erosions, well healed temporal cataract wounds, arcus, trace EBMD Mild arcus, trace PEE   Anterior Chamber Deep and quiet Deep and quiet   Iris Round and poorly dilated to 3.27mm Round and moderately dilated to 15mm   Lens PC IOL in good position, trace, cortical remnant temporally, PC folds 3+ Nuclear sclerosis with brunescense, 3+ Cortical cataract   Anterior Vitreous Vitreous syneresis, Ozurdex pellet remnants inferiorly, mild blood stained  vitreous condensations -- now white -- slightly improved Vitreous syneresis         Fundus Exam       Right Left   Disc 2-3+Pallor, Sharp rim, temporal PPP/PPA, thin superior rim, +cupping, vascular loops, large disc heme at 0300 - improved Mild Pallor, Sharp rim, PPP, severely attenuated vessels   C/D Ratio 0.7 0.3   Macula Blunted foveal reflex, Drusen, Retinal pigment epithelial mottling, temporal cystic changes with exudates and MA -- slightly increased, punctate exudates - improved, scattered DBH greatest inferior macula Flat, Blunted foveal reflex, RPE mottling and clumping, scarring, atrophy, no heme   Vessels Severe attenuation, tortuosity Severe Vascular attenuation, +fibrosis   Periphery Attached, 360 DBH -- greatest posteriorly Attached, pavingstone and reticular degeneration inferiorly, scattered RPE atrophy, greatest peripapillary           Refraction     Wearing Rx       Sphere Cylinder Axis Add   Right -1.00 +0.75 180 +2.25   Left -0.50 +0.75 155 +2.25           IMAGING AND PROCEDURES  Imaging and Procedures for @TODAY @  OCT, Retina - OU - Both Eyes       Right Eye Quality was good. Central Foveal Thickness: 202. Progression has worsened. Findings include normal foveal contour, no SRF, intraretinal hyper-reflective material, intraretinal fluid, vitreomacular adhesion (interval increase in IRF temporal macula, +vitreous opacities).   Left Eye Quality was borderline. Central Foveal Thickness: 259. Progression has been stable. Findings include no SRF, abnormal foveal contour, subretinal hyper-reflective material, epiretinal membrane, intraretinal fluid, pigment epithelial detachment, outer retinal atrophy, preretinal fibrosis (Scattered IRF/cystic changes, persistent central ORA / SRHM).   Notes *Images captured  and stored on drive  Diagnosis / Impression:  OD: interval increase in IRF temporal macula, +vitreous opacities -- persistent OS: Scattered  IRF/cystic changes, persistent central ORA / SRHM  Clinical management:  See below  Abbreviations: NFP - Normal foveal profile. CME - cystoid macular edema. PED - pigment epithelial detachment. IRF - intraretinal fluid. SRF - subretinal fluid. EZ - ellipsoid zone. ERM - epiretinal membrane. ORA - outer retinal atrophy. ORT - outer retinal tubulation. SRHM - subretinal hyper-reflective material      Intravitreal Injection, Pharmacologic Agent - OD - Right Eye       Time Out 02/08/2022. 2:44 PM. Confirmed correct patient, procedure, site, and patient consented.   Anesthesia Topical anesthesia was used. Anesthetic medications included Lidocaine 2%, Proparacaine 0.5%.   Procedure Preparation included 5% betadine to ocular surface, eyelid speculum. A (32g) needle was used.   Injection: 2 mg aflibercept 2 MG/0.05ML   Route: Intravitreal, Site: Right Eye   NDC: L6038910, Lot: [id number], Expiration date: 05/14/2022, Waste: 0 mL   Post-op Post injection exam found visual acuity of at least counting fingers, no retinal detachment, perfused optic nerve. The patient tolerated the procedure well. There were no complications. The patient received written and verbal post procedure care education.            ASSESSMENT/PLAN:    ICD-10-CM   1. Central retinal vein occlusion with macular edema of both eyes  H34.8130 OCT, Retina - OU - Both Eyes    Intravitreal Injection, Pharmacologic Agent - OD - Right Eye    aflibercept (EYLEA) SOLN 2 mg    2. Severe nonproliferative diabetic retinopathy of both eyes with macular edema associated with type 2 diabetes mellitus (HCC)  Q59.5638     3. Essential hypertension  I10     4. Hypertensive retinopathy of both eyes  H35.033     5. 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 Pseudophakia  Z96.1     7. Primary open angle glaucoma (POAG) of right eye, mild stage  H40.1111     8. Vitreous hemorrhage of right eye (HCC)   H43.11     9. Retinal edema  H35.81     10. Moderate nonproliferative diabetic retinopathy of both eyes with macular edema associated with type 2 diabetes mellitus (HCC)  V56.4332      1. CRVO w/ CME OU  - delayed f/u to 8 wks instead of 5 due to hospital admission for COVID  - pt of Dr. Ashley Royalty who wished to transfer care  - OS long-standing low vision (CF) with subfoveal scar and central retinal atrophy  - OD with CME actively being managed with Ozurdex OD ~q5 wks by Dr. Ashley Royalty (last on 8.20.20) s/p multiple IVTA and Ozurdex OD  - was due for Ozurdex OD w/ Ashley Royalty on 9.25.20, but underwent cataract surgery OD w/ Alben Spittle on 9.21.20 - s/p IVTA OD #4 (10.26.20), #5 (12.10.20), #6 (01.21.21), #7 (03.05.21), #8 (04.16.21), #9 (05.28.21), #10 (07.09.21), #11 (09.14.21), #12 (10.29.21), #13 (12.17.21), #14 (01.27.22), #15 (03.11.2022), #16 (05.04.22), #17 (06.17.22), #18 (07.29.22), #19 (09.16.22), #20 (10.28.22), #21 (12.16.22), #22 (02.03.23) #23 (04.07.23), #24 (06.14.23)  - s/p IVA OD #1 (07.17.23), #2 (08.14.23), #3 (09.14.23), #4 (10.18.23) #5 (12/15/2022)  - BCVA stable at 20/100  - exam shows mild improvement in blood stained vit condensations  - OCT OD: interval increase in IRF temporal macula, +vitreous opacities -- persistent; OS: Scattered IRF/cystic changes, persistent central ORA /  SRHM  - discussed IVA resistance and potential benefit of switching medication  - recommend IVE OD #1 today, 01.26.24 with follow up in 4 weeks  - pt wishes to proceed with injection  - RBA of procedure discussed, questions answered - see procedure note  - IVE informed consent obtained and signed, 01.26.24 (OD) - IVA informed consent obtained and signed, 07.17.23 (OD)  - IVTA informed consent obtained and re-signed on 06.14.23  - f/u 4 weeks -- DFE/OCT/possible injection  2. Severe Non-proliferative diabetic retinopathy OU.  - OCT shows diabetic macular edema, OD -- slightly increased  - s/p IVTA  OD on 06.14.23 as above  - s/p IVA OD #1 on 07.17.23 as above for DME as well as CRVO  - IVE OD #1 today 01.26.24 as above  3,4. Hypertensive retinopathy OU  - discussed importance of tight BP control  - continue to monitor  5. Mixed form age related cataract OS  - under the expert management of [facility]  6. Pseudophakia OD  - s/p CE/IOL OD (9.21.20) by expert surgeon, Dr. Kathlen Mody  - IOL in excellent position  - had post op IOP spike -- IOP now under better control  - continue to monitor  7. POAG OD  - now under the expert management of Dr. Edilia Bo (initial consult 07.27.23)  - s/p SLT and goniotomy OD w/ Dr. Kathlen Mody  - had post-cataract IOP spike  - on Brimonidine TID OU and Cosopt BID OU  - IOP 13 today  - AC taps post IVTA injections   Ophthalmic Meds Ordered this visit:  Meds ordered this encounter  Medications   aflibercept (EYLEA) SOLN 2 mg     Return in about 4 weeks (around 03/08/2022) for CRVO OD, Dilated Exam, OCT, Possible Injxn.  There are no Patient Instructions on file for this visit.   This document serves as a record of services personally performed by Gardiner Sleeper, MD, PhD. It was created on their behalf by Bernarda Caffey, MD, an ophthalmic technician. The creation of this record is the provider's dictation and/or activities during the visit.    Electronically signed by: Bernarda Caffey, MD 02/07/2022 1:42 AM  Gardiner Sleeper, M.D., Ph.D. Diseases & Surgery of the Retina and Vitreous [facility]  I have reviewed the above documentation for accuracy and completeness, and I agree with the above. Gardiner Sleeper, M.D., Ph.D. 02/09/22 1:47 AM   Abbreviations: M myopia (nearsighted); A astigmatism; H hyperopia (farsighted); P presbyopia; Mrx spectacle prescription;  CTL contact lenses; OD right eye; OS left eye; OU both eyes  XT exotropia; ET esotropia; PEK punctate epithelial keratitis; PEE punctate epithelial erosions; DES dry eye  syndrome; MGD meibomian gland dysfunction; ATs artificial tears; PFAT's preservative free artificial tears; [place] nuclear sclerotic cataract; PSC posterior subcapsular cataract; ERM epi-retinal membrane; PVD posterior vitreous detachment; RD retinal detachment; DM diabetes mellitus; DR diabetic retinopathy; NPDR non-proliferative diabetic retinopathy; PDR proliferative diabetic retinopathy; CSME clinically significant macular edema; DME diabetic macular edema; dbh dot blot hemorrhages; CWS cotton wool spot; POAG primary open angle glaucoma; C/D cup-to-disc ratio; HVF humphrey visual field; GVF goldmann visual field; OCT optical coherence tomography; IOP intraocular pressure; BRVO Branch retinal vein occlusion; CRVO central retinal vein occlusion; CRAO central retinal artery occlusion; BRAO branch retinal artery occlusion; RT retinal tear; SB scleral buckle; PPV pars plana vitrectomy; VH Vitreous hemorrhage; PRP panretinal laser photocoagulation; IVK intravitreal kenalog; VMT vitreomacular traction; MH Macular hole;  NVD neovascularization of  the disc; NVE neovascularization elsewhere; AREDS age related eye disease study; ARMD age related macular degeneration; POAG primary open angle glaucoma; EBMD epithelial/anterior basement membrane dystrophy; ACIOL anterior chamber intraocular lens; IOL intraocular lens; PCIOL posterior chamber intraocular lens; Phaco/IOL phacoemulsification with intraocular lens placement; [place] photorefractive keratectomy; LASIK laser assisted in situ keratomileusis; HTN hypertension; DM diabetes mellitus; COPD chronic obstructive pulmonary disease

## 2022-02-08 ENCOUNTER — Ambulatory Visit (INDEPENDENT_AMBULATORY_CARE_PROVIDER_SITE_OTHER): Payer: Medicare (Managed Care) | Admitting: Ophthalmology

## 2022-02-08 ENCOUNTER — Encounter (INDEPENDENT_AMBULATORY_CARE_PROVIDER_SITE_OTHER): Payer: Self-pay | Admitting: Ophthalmology

## 2022-02-08 DIAGNOSIS — H4311 Vitreous hemorrhage, right eye: Secondary | ICD-10-CM

## 2022-02-08 DIAGNOSIS — H35033 Hypertensive retinopathy, bilateral: Secondary | ICD-10-CM | POA: Diagnosis not present

## 2022-02-08 DIAGNOSIS — H25812 Combined forms of age-related cataract, left eye: Secondary | ICD-10-CM

## 2022-02-08 DIAGNOSIS — H34813 Central retinal vein occlusion, bilateral, with macular edema: Secondary | ICD-10-CM

## 2022-02-08 DIAGNOSIS — I10 Essential (primary) hypertension: Secondary | ICD-10-CM | POA: Diagnosis not present

## 2022-02-08 DIAGNOSIS — E113313 Type 2 diabetes mellitus with moderate nonproliferative diabetic retinopathy with macular edema, bilateral: Secondary | ICD-10-CM

## 2022-02-08 DIAGNOSIS — H401111 Primary open-angle glaucoma, right eye, mild stage: Secondary | ICD-10-CM

## 2022-02-08 DIAGNOSIS — H3581 Retinal edema: Secondary | ICD-10-CM

## 2022-02-08 DIAGNOSIS — Z961 Presence of intraocular lens: Secondary | ICD-10-CM

## 2022-02-08 DIAGNOSIS — E113413 Type 2 diabetes mellitus with severe nonproliferative diabetic retinopathy with macular edema, bilateral: Secondary | ICD-10-CM | POA: Diagnosis not present

## 2022-02-08 MED ORDER — AFLIBERCEPT 2MG/0.05ML IZ SOLN FOR KALEIDOSCOPE
2.0000 mg | INTRAVITREAL | Status: AC | PRN
  Administered 2022-02-08: 2 mg via INTRAVITREAL

## 2022-02-28 ENCOUNTER — Ambulatory Visit (INDEPENDENT_AMBULATORY_CARE_PROVIDER_SITE_OTHER): Payer: Medicare (Managed Care) | Admitting: Vascular Surgery

## 2022-02-28 ENCOUNTER — Ambulatory Visit (HOSPITAL_COMMUNITY)
Admission: RE | Admit: 2022-02-28 | Discharge: 2022-02-28 | Disposition: A | Discharge status: Home / Self Care | Payer: Medicare (Managed Care) | Source: Ambulatory Visit | Attending: Vascular Surgery | Admitting: Vascular Surgery

## 2022-02-28 ENCOUNTER — Encounter: Payer: Self-pay | Admitting: Vascular Surgery

## 2022-02-28 VITALS — BP 158/74 | HR 62 | Temp 97.7°F | Resp 20 | Ht 67.0 in | Wt 317.0 lb

## 2022-02-28 DIAGNOSIS — I890 Lymphedema, not elsewhere classified: Secondary | ICD-10-CM

## 2022-02-28 DIAGNOSIS — I739 Peripheral vascular disease, unspecified: Secondary | ICD-10-CM

## 2022-02-28 LAB — VAS US ABI WITH/WO TBI

## 2022-02-28 NOTE — Progress Notes (Signed)
REASON FOR VISIT:   Follow-up of lymphedema  MEDICAL ISSUES:   LYMPHEDEMA: This patient has severe bilateral lower extremity lymphedema.  This is more significant on the left side.  She does have a compression pump.  We had a long discussion about the importance of continuing to try to use this for 2 hours a day.  I encouraged her to not spend much time with her feet hanging down.  We have also discussed the importance of elevating her legs correctly.  I recommended that she obtain a extrawide lounge doctor leg rest so that she can elevate her legs effectively.  Fortunately the wound on her left great toe has healed.  I offered to see her back for routine follow-up visit she was referred to be seen as needed.  She will call if her swelling worsens.  Given that the toe has healed no further arterial workup is indicated at this time.    HPI:   Mary [name] is a pleasant 77 y.o. female who I last saw on 09/20/2021 with lymphedema and a wound on the left great toe.  I originally saw her in consultation on 08/23/2021 with severe lymphedema and a wound on the toe.  She had lymphedema for years but this is gradually gotten worse.  She spent a lot of at times with her legs dependent.  She has been nonambulatory since May of last year.  She does have a lymphedema pump.  Her BMI was 50.  I encouraged her to use her lymphedema pump for 2 hours a day.  We also discussed importance of leg elevation and trying to avoid prolonged periods of sitting.  Based on her exam she had evidence of reasonable perfusion so we did not rush to arteriography.  She does have a history of some chronic kidney disease and would likely require CO2 which would be associated with poor visualization of the tibials.  She comes in for a follow-up visit.  Since I saw her last, she was hospitalized at [facility] in December with [name].  While she was in bed rest essentially her swelling did improve.  However, when she got home and her  legs were more dependent her swelling began to increase again.  She has not been using her compression pump as much as before.  I am not sure how much she has been elevating her legs.  She does not tell me that the left great toe wound has completely healed.  Past Medical History:  Diagnosis Date   Arthritis    osteoarthritis   Cataract    OS   Diabetes mellitus    Insulin dependent   Diabetic retinopathy (HCC)    NPDR OU   Glaucoma    POAG OD   H/O cardiovascular stress test    pt. reports 10/13- was normal per pt.    Hyperlipemia    Hypertension    Hypertensive retinopathy    OU    Family History  Problem Relation Age of Onset   Hypertension Mother    Heart Problems Father    Gout Father    Arthritis Father     SOCIAL HISTORY: Social History   Tobacco Use   Smoking status: Former    Packs/day: 1.00    Years: 30.00    Total pack years: 30.00    Types: Cigarettes    Start date: 01/15/1967    Quit date: 06/28/2014    Years since quitting: 7.6   Smokeless tobacco:  Never  Substance Use Topics   Alcohol use: No    No Known Allergies  Current Outpatient Medications  Medication Sig Dispense Refill   amLODipine (NORVASC) 5 MG tablet Take 1 tablet (5 mg total) by mouth daily. 30 tablet 0   aspirin 81 MG chewable tablet Chew 81 mg by mouth daily.     Assure Comfort Lancets 28G MISC      benzonatate (TESSALON) 200 MG capsule Take 1 capsule (200 mg total) by mouth 3 (three) times daily. 20 capsule 0   Blood Glucose Monitoring Suppl (ONETOUCH VERIO FLEX SYSTEM) w/Device KIT Use as directed to check blood sugars 2 times per day dx: e11.65 1 kit 1   brimonidine (ALPHAGAN) 0.2 % ophthalmic solution INSTILL 1 DROP INTO BOTH EYES TWICE A DAY (Patient taking differently: Place 1 drop into both eyes 3 (three) times daily.) 10 mL 5   cetirizine (ZYRTEC) 10 MG tablet Take 1 tablet (10 mg total) by mouth daily. 90 tablet 1   Cholecalciferol (VITAMIN D) 50 MCG (2000 UT) CAPS Take  2,000 Units by mouth daily at 6 (six) AM.     dorzolamide-timolol (COSOPT) 22.3-6.8 MG/ML ophthalmic solution Place 1 drop into the right eye 2 (two) times daily. (Patient taking differently: Place 1 drop into both eyes 2 (two) times daily.) 10 mL 6   Emollient (BAG BALM) OINT Apply 1 Application topically 4 (four) times daily as needed (itching).     fluticasone (FLONASE) 50 MCG/ACT nasal spray SPRAY 1 SPRAY INTO EACH NOSTRIL EVERY DAY (Patient taking differently: Place 1 spray into both nostrils daily.) 32 mL 1   furosemide (LASIX) 40 MG tablet TAKE 1 TABLET BY MOUTH  DAILY 90 tablet 3   glucose blood (ONETOUCH VERIO) test strip Use as directed to check blood sugars 2 times per day dx: e11.65 100 each 3   HYDROcodone-acetaminophen (NORCO) 10-325 MG tablet Take 1 tablet by mouth 5 (five) times daily as needed for moderate pain.     Ipratropium-Albuterol (COMBIVENT) 20-100 MCG/ACT AERS respimat Inhale 1 puff into the lungs 2 (two) times daily. 4 g 0   levothyroxine (SYNTHROID) 125 MCG tablet Take 125 mcg by mouth daily before breakfast.     loperamide (IMODIUM) 2 MG capsule Take 2 mg by mouth every 6 (six) hours as needed for diarrhea or loose stools.     metoprolol succinate (TOPROL-XL) 50 MG 24 hr tablet TAKE 1 TABLET BY MOUTH  DAILY (Patient taking differently: Take 50 mg by mouth daily.) 90 tablet 1   montelukast (SINGULAIR) 10 MG tablet Take 10 mg by mouth at bedtime.     nystatin cream (MYCOSTATIN) Apply 1 Application topically 2 (two) times daily as needed for dry skin.     omeprazole (PRILOSEC) 20 MG capsule TAKE 1 CAPSULE BY MOUTH  DAILY (Patient taking differently: Take 20 mg by mouth daily.) 90 capsule 3   OneTouch Delica Lancets 99991111 MISC Use as directed to check blood sugars 2 times per day dx: e11.65 100 each 3   pravastatin (PRAVACHOL) 40 MG tablet TAKE 1 TABLET BY MOUTH EVERY DAY (Patient taking differently: Take 80 mg by mouth daily.) 90 tablet 1   telmisartan (MICARDIS) 80 MG tablet  TAKE 1 TABLET BY MOUTH  DAILY (Patient taking differently: Take 80 mg by mouth daily.) 90 tablet 3   timolol (BETIMOL) 0.25 % ophthalmic solution Place 1 drop into both eyes 2 (two) times daily.     triamcinolone (KENALOG) 0.1 % APPLY TO AFFECTED  AREA TWICE A DAY (Patient taking differently: Apply 1 Application topically 2 (two) times daily as needed (rash).) 30 g 0   No current facility-administered medications for this visit.    REVIEW OF SYSTEMS:  [X]$  denotes positive finding, [ ]$  denotes negative finding Cardiac  Comments:  Chest pain or chest pressure:    Shortness of breath upon exertion:    Short of breath when lying flat:    Irregular heart rhythm:        Vascular    Pain in calf, thigh, or hip brought on by ambulation:    Pain in feet at night that wakes you up from your sleep:     Blood clot in your veins:    Leg swelling:         Pulmonary    Oxygen at home:    Productive cough:     Wheezing:         Neurologic    Sudden weakness in arms or legs:     Sudden numbness in arms or legs:     Sudden onset of difficulty speaking or slurred speech:    Temporary loss of vision in one eye:     Problems with dizziness:         Gastrointestinal    Blood in stool:     Vomited blood:         Genitourinary    Burning when urinating:     Blood in urine:        Psychiatric    Major depression:         Hematologic    Bleeding problems:    Problems with blood clotting too easily:        Skin    Rashes or ulcers:        Constitutional    Fever or chills:     PHYSICAL EXAM:   Vitals:   02/28/22 1400  BP: (!) 158/74  Pulse: 62  Resp: 20  Temp: 97.7 F (36.5 C)  SpO2: 95%  Weight: (!) 317 lb (143.8 kg)  Height: 5' 7"$  (1.702 m)    GENERAL: The patient is a well-nourished female, in no acute distress. The vital signs are documented above. CARDIAC: There is a regular rate and rhythm.  VASCULAR: The patient has massive bilateral lower extremity lymphedema which  is more significant on the left side.     PULMONARY: There is good air exchange bilaterally without wheezing or rales. MUSCULOSKELETAL: There are no major deformities or cyanosis. NEUROLOGIC: No focal weakness or paresthesias are detected. SKIN: There are no ulcers or rashes noted. PSYCHIATRIC: The patient has a normal affect.  DATA:    ARTERIAL DOPPLER STUDY: I have independently interpreted her arterial Doppler study.  This was technically limited because of her severe lymphedema.  On the right side she has a triphasic dorsalis pedis signal.  ABIs 100%.  Toe pressures 164 mmHg.   Deitra Mayo [facility] [phone]

## 2022-03-07 NOTE — Progress Notes (Shared)
[facility] Note  03/08/2022     CHIEF COMPLAINT Patient presents for Retina Follow Up    HISTORY OF PRESENT ILLNESS: Mary [name] is a 77 y.o. female who presents to the clinic today for:   HPI     Retina Follow Up   Patient presents with  Diabetic Retinopathy.  In both eyes.  This started 4 weeks ago.  Duration of 4 weeks.  Since onset it is gradually worsening.        Comments   4 week retina follow up NPDR OU and I'VE OD pt feels like she is not seeing as well things are darker she denies flashes or floaters her last glucose reading 90 1 week ago       Last edited by Parthenia Ames, COT on 03/08/2022  9:36 AM.     Patient feels that switching the injection medication that she is not seeing any improvement.  Referring physician: Merlene Laughter, MD [phone] [facility],  [place] [address]  HISTORICAL INFORMATION:   Selected notes from the MEDICAL RECORD NUMBER Transferred care from Dr. Zigmund Daniel   CURRENT MEDICATIONS: Current Outpatient Medications (Ophthalmic Drugs)  Medication Sig   brimonidine (ALPHAGAN) 0.2 % ophthalmic solution INSTILL 1 DROP INTO BOTH EYES TWICE A DAY (Patient taking differently: Place 1 drop into both eyes 3 (three) times daily.)   dorzolamide-timolol (COSOPT) 22.3-6.8 MG/ML ophthalmic solution Place 1 drop into the right eye 2 (two) times daily. (Patient taking differently: Place 1 drop into both eyes 2 (two) times daily.)   timolol (BETIMOL) 0.25 % ophthalmic solution Place 1 drop into both eyes 2 (two) times daily.   No current facility-administered medications for this visit. (Ophthalmic Drugs)   Current Outpatient Medications (Other)  Medication Sig   amLODipine (NORVASC) 5 MG tablet Take 1 tablet (5 mg total) by mouth daily.   aspirin 81 MG chewable tablet Chew 81 mg by mouth daily.   Assure Comfort Lancets 28G MISC    benzonatate (TESSALON) 200 MG capsule Take 1 capsule (200 mg total)  by mouth 3 (three) times daily.   Blood Glucose Monitoring Suppl (ONETOUCH VERIO FLEX SYSTEM) w/Device KIT Use as directed to check blood sugars 2 times per day dx: e11.65   cetirizine (ZYRTEC) 10 MG tablet Take 1 tablet (10 mg total) by mouth daily.   Cholecalciferol (VITAMIN D) 50 MCG (2000 UT) CAPS Take 2,000 Units by mouth daily at 6 (six) AM.   Emollient (BAG BALM) OINT Apply 1 Application topically 4 (four) times daily as needed (itching).   fluticasone (FLONASE) 50 MCG/ACT nasal spray SPRAY 1 SPRAY INTO EACH NOSTRIL EVERY DAY (Patient taking differently: Place 1 spray into both nostrils daily.)   furosemide (LASIX) 40 MG tablet TAKE 1 TABLET BY MOUTH  DAILY   glucose blood (ONETOUCH VERIO) test strip Use as directed to check blood sugars 2 times per day dx: e11.65   HYDROcodone-acetaminophen (NORCO) 10-325 MG tablet Take 1 tablet by mouth 5 (five) times daily as needed for moderate pain.   Ipratropium-Albuterol (COMBIVENT) 20-100 MCG/ACT AERS respimat Inhale 1 puff into the lungs 2 (two) times daily.   levothyroxine (SYNTHROID) 125 MCG tablet Take 125 mcg by mouth daily before breakfast.   loperamide (IMODIUM) 2 MG capsule Take 2 mg by mouth every 6 (six) hours as needed for diarrhea or loose stools.   metoprolol succinate (TOPROL-XL) 50 MG 24 hr tablet TAKE 1 TABLET BY MOUTH  DAILY (  Patient taking differently: Take 50 mg by mouth daily.)   montelukast (SINGULAIR) 10 MG tablet Take 10 mg by mouth at bedtime.   nystatin cream (MYCOSTATIN) Apply 1 Application topically 2 (two) times daily as needed for dry skin.   omeprazole (PRILOSEC) 20 MG capsule TAKE 1 CAPSULE BY MOUTH  DAILY (Patient taking differently: Take 20 mg by mouth daily.)   OneTouch Delica Lancets 99991111 MISC Use as directed to check blood sugars 2 times per day dx: e11.65   pravastatin (PRAVACHOL) 40 MG tablet TAKE 1 TABLET BY MOUTH EVERY DAY (Patient taking differently: Take 80 mg by mouth daily.)   telmisartan (MICARDIS) 80 MG  tablet TAKE 1 TABLET BY MOUTH  DAILY (Patient taking differently: Take 80 mg by mouth daily.)   triamcinolone (KENALOG) 0.1 % APPLY TO AFFECTED AREA TWICE A DAY (Patient taking differently: Apply 1 Application topically 2 (two) times daily as needed (rash).)   No current facility-administered medications for this visit. (Other)   REVIEW OF SYSTEMS: ROS   Positive for: Skin, Endocrine, Cardiovascular, Eyes Negative for: Constitutional, Gastrointestinal, Neurological, Genitourinary, Musculoskeletal, HENT, Respiratory, Psychiatric, Allergic/Imm, Heme/Lymph Last edited by Parthenia Ames, COT on 03/08/2022  9:36 AM.      ALLERGIES No Known Allergies  PAST MEDICAL HISTORY Past Medical History:  Diagnosis Date   Arthritis    osteoarthritis   Cataract    OS   Diabetes mellitus    Insulin dependent   Diabetic retinopathy ([place])    NPDR OU   Glaucoma    POAG OD   H/O cardiovascular stress test    pt. reports 10/13- was normal per pt.    Hyperlipemia    Hypertension    Hypertensive retinopathy    OU   Past Surgical History:  Procedure Laterality Date   CARPAL TUNNEL RELEASE Bilateral    CATARACT EXTRACTION Right 10/05/2018   Dr. Kathlen Mody   EYE SURGERY Right    Cat Sx   KNEE ARTHROSCOPY     right   QUADRICEPS TENDON REPAIR  02/17/2012   Procedure: REPAIR QUADRICEP TENDON;  Surgeon: Vickey Huger, MD;  Location: [facility];  Service: Orthopedics;  Laterality: Right;  right quadriceps repair with latera release   QUADRICEPS TENDON REPAIR Right 05/15/2012   Procedure: REPAIR QUADRICEP TENDON;  Surgeon: Vickey Huger, MD;  Location: [facility];  Service: Orthopedics;  Laterality: Right;   TOTAL KNEE ARTHROPLASTY  10/21/2011   Procedure: TOTAL KNEE ARTHROPLASTY;  Surgeon: Rudean Haskell, MD;  Location: [facility];  Service: Orthopedics;  Laterality: Right;   TUBAL LIGATION     FAMILY HISTORY Family History  Problem Relation Age of Onset   Hypertension Mother    Heart Problems Father     Gout Father    Arthritis Father    SOCIAL HISTORY Social History   Tobacco Use   Smoking status: Former    Packs/day: 1.00    Years: 30.00    Total pack years: 30.00    Types: Cigarettes    Start date: 01/15/1967    Quit date: 06/28/2014    Years since quitting: 7.6   Smokeless tobacco: Never  Vaping Use   Vaping Use: Never used  Substance Use Topics   Alcohol use: No   Drug use: Yes    Types: Hydrocodone       OPHTHALMIC EXAM: Base Eye Exam     Visual Acuity (Snellen - Linear)       Right Left   Dist [place] 20/150 HM  Dist ph [place] NI NI    Correction: Glasses         Tonometry (Tonopen, 9:39 AM)       Right Left   Pressure 14 15         Pupils       Pupils Dark Light Shape React APD   Right PERRL 2 1 Round Sluggish None   Left PERRL 2 1 Round Sluggish None         Visual Fields       Left Right   Restrictions Total superior temporal, inferior temporal, superior nasal, inferior nasal deficiencies          Extraocular Movement       Right Left    Full, Ortho Full, Ortho         Neuro/Psych     Oriented x3: Yes   Mood/Affect: Normal         Dilation     Both eyes: 2.5% Phenylephrine @ 9:39 AM           Slit Lamp and Fundus Exam     Slit Lamp Exam       Right Left   Lids/Lashes Dermatochalasis - upper lid, mild Meibomian gland dysfunction Dermatochalasis - upper lid, mild Meibomian gland dysfunction   Conjunctiva/Sclera Melanosis Melanosis   Cornea Trace, Punctate epithelial erosions, well healed temporal cataract wounds, arcus, trace EBMD Mild arcus, trace PEE   Anterior Chamber Deep and quiet Deep and quiet   Iris Round and poorly dilated to 3.39m Round and moderately dilated to 626m  Lens PC IOL in good position, trace, cortical remnant temporally, PC folds 3+ Nuclear sclerosis with brunescense, 3+ Cortical cataract   Anterior Vitreous Vitreous syneresis, Ozurdex pellet remnants inferiorly, mild blood stained vitreous  condensations -- now white -- slightly improved Vitreous syneresis         Fundus Exam       Right Left   Disc 2-3+Pallor, Sharp rim, temporal PPP/PPA, thin superior rim, +cupping, vascular loops, large disc heme at 0300 - improved Mild Pallor, Sharp rim, PPP, severely attenuated vessels   C/D Ratio 0.7 0.3   Macula Blunted foveal reflex, Drusen, Retinal pigment epithelial mottling, temporal cystic changes with exudates and MA -- slightly increased, punctate exudates - improved, scattered DBH greatest inferior macula Flat, Blunted foveal reflex, RPE mottling and clumping, scarring, atrophy, no heme   Vessels Severe attenuation, tortuosity Severe Vascular attenuation, +fibrosis   Periphery Attached, 360 DBH -- greatest posteriorly Attached, pavingstone and reticular degeneration inferiorly, scattered RPE atrophy, greatest peripapillary           Refraction     Wearing Rx       Sphere Cylinder Axis Add   Right -1.00 +0.75 180 +2.25   Left -0.50 +0.75 155 +2.25           IMAGING AND PROCEDURES  Imaging and Procedures for '@TODAY'$ @          ASSESSMENT/PLAN:    ICD-10-CM   1. Central retinal vein occlusion with macular edema of both eyes  H34.8130 OCT, Retina - OU - Both Eyes    2. Severe nonproliferative diabetic retinopathy of both eyes with macular edema associated with type 2 diabetes mellitus ([id number] [id number]   3. Essential hypertension  I10     4. Hypertensive retinopathy of both eyes  H35.033     5. Combined forms of age-related cataract of left eye  H25.812  1. CRVO w/ CME OU  - delayed f/u to 8 wks instead of 5 due to hospital admission for COVID  - pt of Dr. Zigmund Daniel who wished to transfer care - OS long-standing low vision (CF) with subfoveal scar and central retinal atrophy - OD with CME actively being managed with Ozurdex OD ~q5 wks by Dr. Zigmund Daniel (last on 8.20.20) s/p multiple IVTA and Ozurdex OD - was due for Ozurdex OD w/ Zigmund Daniel on 9.25.20,  but underwent cataract surgery OD w/ Kathlen Mody on 9.21.20 - s/p IVTA OD #4 (10.26.20), #5 (12.10.20), #6 (01.21.21), #7 (03.05.21), #8 (04.16.21), #9 (05.28.21), #10 (07.09.21), #11 (09.14.21), #12 (10.29.21), #13 (12.17.21), #14 (01.27.22), #15 (03.11.2022), #16 (05.04.22), #17 (06.17.22), #18 (07.29.22), #19 (09.16.22), #20 (10.28.22), #21 (12.16.22), #22 (02.03.23) #23 (04.07.23), #24 (06.14.23) - s/p IVA OD #1 (07.17.23), #2 (08.14.23), #3 (09.14.23), #4 (10.18.23) #5 (12/15/2022)  - BCVA stable at 20/150 decrease  - exam shows mild improvement in blood stained vit condensations - OCT OD: interval increase in cystic changes inferior macula, interval improvement in IRF temporal macula, +vitreous opacities OS: Scattered IRF/cystic changes, persistent central ORA / SRHM  - discussed IVA resistance and potential benefit of switching medication  - recommend IVE OD #1 today, 01.26.24 with follow up in 4 weeks  - pt wishes to proceed with injection  - RBA of procedure discussed, questions answered - see procedure note  - IVE informed consent obtained and signed, 01.26.24 (OD) - IVA informed consent obtained and signed, 07.17.23 (OD) -s/p IVE OD # 1 (01.26/24)  - IVTA informed consent obtained and re-signed on 06.14.23  - f/u 4 weeks -- DFE/OCT/possible injection  2. Severe Non-proliferative diabetic retinopathy OU.  - OCT shows diabetic macular edema, OD -- slightly increased  - s/p IVTA OD on 06.14.23 as above  - s/p IVA OD #1 on 07.17.23 as above for DME as well as CRVO  - IVE OD #2 today 02.23.24 as above  3,4. Hypertensive retinopathy OU  - discussed importance of tight BP control  - continue to monitor  5. Mixed form age related cataract OS  - under the expert management of [facility]  6. Pseudophakia OD  - s/p CE/IOL OD (9.21.20) by expert surgeon, Dr. Kathlen Mody  - IOL in excellent position  - had post op IOP spike -- IOP now under better control  - continue to monitor  7. POAG  OD  - now under the expert management of Dr. Edilia Bo (initial consult 07.27.23)  - s/p SLT and goniotomy OD w/ Dr. Kathlen Mody  - had post-cataract IOP spike  - on Brimonidine TID OU and Cosopt BID OU  - IOP 14 today  - AC taps post IVTA injections   Ophthalmic Meds Ordered this visit:  No orders of the defined types were placed in this encounter.    No follow-ups on file.  There are no Patient Instructions on file for this visit.   This document serves as a record of services personally performed by Gardiner Sleeper, MD, PhD. It was created on their behalf by Joetta Manners COT, an ophthalmic technician. The creation of this record is the provider's dictation and/or activities during the visit.    Electronically signed by: Joetta Manners COT [phone]..22.24 10:34 AM  This document serves as a record of services personally performed by Gardiner Sleeper, MD, PhD. It was created on their behalf by Renaldo Reel, [place] an ophthalmic technician. The creation of this record is the provider's dictation and/or activities  during the visit.    Electronically signed by:  Renaldo Reel, COT  02.23.24 10:37 AM  Abbreviations: M myopia (nearsighted); A astigmatism; H hyperopia (farsighted); P presbyopia; Mrx spectacle prescription;  CTL contact lenses; OD right eye; OS left eye; OU both eyes  XT exotropia; ET esotropia; PEK punctate epithelial keratitis; PEE punctate epithelial erosions; DES dry eye syndrome; MGD meibomian gland dysfunction; ATs artificial tears; PFAT's preservative free artificial tears; [place] nuclear sclerotic cataract; PSC posterior subcapsular cataract; ERM epi-retinal membrane; PVD posterior vitreous detachment; RD retinal detachment; DM diabetes mellitus; DR diabetic retinopathy; NPDR non-proliferative diabetic retinopathy; PDR proliferative diabetic retinopathy; CSME clinically significant macular edema; DME diabetic macular edema; dbh dot blot hemorrhages; CWS cotton wool spot;  POAG primary open angle glaucoma; C/D cup-to-disc ratio; HVF humphrey visual field; GVF goldmann visual field; OCT optical coherence tomography; IOP intraocular pressure; BRVO Branch retinal vein occlusion; CRVO central retinal vein occlusion; CRAO central retinal artery occlusion; BRAO branch retinal artery occlusion; RT retinal tear; SB scleral buckle; PPV pars plana vitrectomy; VH Vitreous hemorrhage; PRP panretinal laser photocoagulation; IVK intravitreal kenalog; VMT vitreomacular traction; MH Macular hole;  NVD neovascularization of the disc; NVE neovascularization elsewhere; AREDS age related eye disease study; ARMD age related macular degeneration; POAG primary open angle glaucoma; EBMD epithelial/anterior basement membrane dystrophy; ACIOL anterior chamber intraocular lens; IOL intraocular lens; PCIOL posterior chamber intraocular lens; Phaco/IOL phacoemulsification with intraocular lens placement; [place] photorefractive keratectomy; LASIK laser assisted in situ keratomileusis; HTN hypertension; DM diabetes mellitus; COPD chronic obstructive pulmonary disease

## 2022-03-08 ENCOUNTER — Encounter (INDEPENDENT_AMBULATORY_CARE_PROVIDER_SITE_OTHER): Payer: Self-pay | Admitting: Ophthalmology

## 2022-03-08 ENCOUNTER — Ambulatory Visit (INDEPENDENT_AMBULATORY_CARE_PROVIDER_SITE_OTHER): Payer: Medicare (Managed Care) | Admitting: Ophthalmology

## 2022-03-08 DIAGNOSIS — H34813 Central retinal vein occlusion, bilateral, with macular edema: Secondary | ICD-10-CM | POA: Diagnosis not present

## 2022-03-08 DIAGNOSIS — H35033 Hypertensive retinopathy, bilateral: Secondary | ICD-10-CM

## 2022-03-08 DIAGNOSIS — I10 Essential (primary) hypertension: Secondary | ICD-10-CM | POA: Diagnosis not present

## 2022-03-08 DIAGNOSIS — H401111 Primary open-angle glaucoma, right eye, mild stage: Secondary | ICD-10-CM

## 2022-03-08 DIAGNOSIS — H25812 Combined forms of age-related cataract, left eye: Secondary | ICD-10-CM

## 2022-03-08 DIAGNOSIS — Z961 Presence of intraocular lens: Secondary | ICD-10-CM

## 2022-03-08 DIAGNOSIS — E113413 Type 2 diabetes mellitus with severe nonproliferative diabetic retinopathy with macular edema, bilateral: Secondary | ICD-10-CM

## 2022-03-08 MED ORDER — AFLIBERCEPT 2MG/0.05ML IZ SOLN FOR KALEIDOSCOPE
2.0000 mg | INTRAVITREAL | Status: AC | PRN
  Administered 2022-03-08: 2 mg via INTRAVITREAL

## 2022-04-05 ENCOUNTER — Encounter (INDEPENDENT_AMBULATORY_CARE_PROVIDER_SITE_OTHER): Payer: Medicare (Managed Care) | Admitting: Ophthalmology

## 2022-04-10 NOTE — Progress Notes (Signed)
[facility] Note  04/12/2022     CHIEF COMPLAINT Patient presents for Retina Follow Up    HISTORY OF PRESENT ILLNESS: Mary [name] is a 77 y.o. female who presents to the clinic today for:   HPI     Retina Follow Up   Patient presents with  CRVO/BRVO.  In both eyes.  This started 4 weeks ago.  I, the attending physician,  performed the HPI with the patient and updated documentation appropriately.        Comments   Patient here for 4 weeks (5 weeks) for retina follow up for CRVO OU. Patient states vision terrible. Not good right now. No eye pain.       Last edited by Bernarda Caffey, MD on 04/12/2022  2:22 PM.      Referring physician: Merlene Laughter, MD No address on file  HISTORICAL INFORMATION:   Selected notes from the [facility] Transferred care from Dr. Zigmund Daniel   CURRENT MEDICATIONS: Current Outpatient Medications (Ophthalmic Drugs)  Medication Sig   brimonidine (ALPHAGAN) 0.2 % ophthalmic solution INSTILL 1 DROP INTO BOTH EYES TWICE A DAY (Patient taking differently: Place 1 drop into both eyes 3 (three) times daily.)   dorzolamide-timolol (COSOPT) 22.3-6.8 MG/ML ophthalmic solution Place 1 drop into the right eye 2 (two) times daily. (Patient taking differently: Place 1 drop into both eyes 2 (two) times daily.)   timolol (BETIMOL) 0.25 % ophthalmic solution Place 1 drop into both eyes 2 (two) times daily.   No current facility-administered medications for this visit. (Ophthalmic Drugs)   Current Outpatient Medications (Other)  Medication Sig   amLODipine (NORVASC) 5 MG tablet Take 1 tablet (5 mg total) by mouth daily.   aspirin 81 MG chewable tablet Chew 81 mg by mouth daily.   Assure Comfort Lancets 28G MISC    benzonatate (TESSALON) 200 MG capsule Take 1 capsule (200 mg total) by mouth 3 (three) times daily.   Blood Glucose Monitoring Suppl (ONETOUCH VERIO FLEX SYSTEM) w/Device KIT Use as directed to check  blood sugars 2 times per day dx: e11.65   cetirizine (ZYRTEC) 10 MG tablet Take 1 tablet (10 mg total) by mouth daily.   Cholecalciferol (VITAMIN D) 50 MCG (2000 UT) CAPS Take 2,000 Units by mouth daily at 6 (six) AM.   Emollient (BAG BALM) OINT Apply 1 Application topically 4 (four) times daily as needed (itching).   fluticasone (FLONASE) 50 MCG/ACT nasal spray SPRAY 1 SPRAY INTO EACH NOSTRIL EVERY DAY (Patient taking differently: Place 1 spray into both nostrils daily.)   furosemide (LASIX) 40 MG tablet TAKE 1 TABLET BY MOUTH  DAILY   glucose blood (ONETOUCH VERIO) test strip Use as directed to check blood sugars 2 times per day dx: e11.65   HYDROcodone-acetaminophen (NORCO) 10-325 MG tablet Take 1 tablet by mouth 5 (five) times daily as needed for moderate pain.   Ipratropium-Albuterol (COMBIVENT) 20-100 MCG/ACT AERS respimat Inhale 1 puff into the lungs 2 (two) times daily.   levothyroxine (SYNTHROID) 125 MCG tablet Take 125 mcg by mouth daily before breakfast.   loperamide (IMODIUM) 2 MG capsule Take 2 mg by mouth every 6 (six) hours as needed for diarrhea or loose stools.   metoprolol succinate (TOPROL-XL) 50 MG 24 hr tablet TAKE 1 TABLET BY MOUTH  DAILY (Patient taking differently: Take 50 mg by mouth daily.)   montelukast (SINGULAIR) 10 MG tablet Take 10 mg by mouth at bedtime.   nystatin  cream (MYCOSTATIN) Apply 1 Application topically 2 (two) times daily as needed for dry skin.   omeprazole (PRILOSEC) 20 MG capsule TAKE 1 CAPSULE BY MOUTH  DAILY (Patient taking differently: Take 20 mg by mouth daily.)   OneTouch Delica Lancets [id number] MISC Use as directed to check blood sugars 2 times per day dx: e11.65   pravastatin (PRAVACHOL) 40 MG tablet TAKE 1 TABLET BY MOUTH EVERY DAY (Patient taking differently: Take 80 mg by mouth daily.)   telmisartan (MICARDIS) 80 MG tablet TAKE 1 TABLET BY MOUTH  DAILY (Patient taking differently: Take 80 mg by mouth daily.)   triamcinolone (KENALOG) 0.1 % APPLY TO  AFFECTED AREA TWICE A DAY (Patient taking differently: Apply 1 Application topically 2 (two) times daily as needed (rash).)   No current facility-administered medications for this visit. (Other)   REVIEW OF SYSTEMS: ROS   Positive for: Skin, Endocrine, Cardiovascular, Eyes Negative for: Constitutional, Gastrointestinal, Neurological, Genitourinary, Musculoskeletal, HENT, Respiratory, Psychiatric, Allergic/Imm, Heme/Lymph Last edited by Theodore Demark, COA on 04/12/2022  1:34 PM.      ALLERGIES No Known Allergies  PAST MEDICAL HISTORY Past Medical History:  Diagnosis Date   Arthritis    osteoarthritis   Cataract    OS   Diabetes mellitus    Insulin dependent   Diabetic retinopathy ([place])    NPDR OU   Glaucoma    POAG OD   H/O cardiovascular stress test    pt. reports 10/13- was normal per pt.    Hyperlipemia    Hypertension    Hypertensive retinopathy    OU   Past Surgical History:  Procedure Laterality Date   CARPAL TUNNEL RELEASE Bilateral    CATARACT EXTRACTION Right 10/05/2018   Dr. Kathlen Mody   EYE SURGERY Right    Cat Sx   KNEE ARTHROSCOPY     right   QUADRICEPS TENDON REPAIR  02/17/2012   Procedure: REPAIR QUADRICEP TENDON;  Surgeon: Vickey Huger, MD;  Location: [facility];  Service: Orthopedics;  Laterality: Right;  right quadriceps repair with latera release   QUADRICEPS TENDON REPAIR Right 05/15/2012   Procedure: REPAIR QUADRICEP TENDON;  Surgeon: Vickey Huger, MD;  Location: [facility];  Service: Orthopedics;  Laterality: Right;   TOTAL KNEE ARTHROPLASTY  10/21/2011   Procedure: TOTAL KNEE ARTHROPLASTY;  Surgeon: Rudean Haskell, MD;  Location: [facility];  Service: Orthopedics;  Laterality: Right;   TUBAL LIGATION     FAMILY HISTORY Family History  Problem Relation Age of Onset   Hypertension Mother    Heart Problems Father    Gout Father    Arthritis Father    SOCIAL HISTORY Social History   Tobacco Use   Smoking status: Former    Packs/day: 1.00    Years:  30.00    Additional pack years: 0.00    Total pack years: 30.00    Types: Cigarettes    Start date: 01/15/1967    Quit date: 06/28/2014    Years since quitting: 7.7   Smokeless tobacco: Never  Vaping Use   Vaping Use: Never used  Substance Use Topics   Alcohol use: No   Drug use: Yes    Types: Hydrocodone       OPHTHALMIC EXAM: Base Eye Exam     Visual Acuity (Snellen - Linear)       Right Left   Dist [place] 20/100 HM   Dist ph [place] NI NI         Tonometry (Tonopen, 1:31 PM)  Right Left   Pressure 11 11         Pupils       Dark Light Shape React APD   Right 2 1 Round Sluggish None   Left 2 1 Round Sluggish None         Visual Fields (Counting fingers)       Left Right     Full   Restrictions Total superior temporal, inferior temporal, superior nasal, inferior nasal deficiencies          Extraocular Movement       Right Left    Full, Ortho Full, Ortho         Neuro/Psych     Oriented x3: Yes   Mood/Affect: Normal         Dilation     Both eyes: 1.0% Mydriacyl, 2.5% Phenylephrine @ 1:31 PM           Slit Lamp and Fundus Exam     Slit Lamp Exam       Right Left   Lids/Lashes Dermatochalasis - upper lid, mild Meibomian gland dysfunction Dermatochalasis - upper lid, mild Meibomian gland dysfunction   Conjunctiva/Sclera Melanosis Melanosis   Cornea Trace, Punctate epithelial erosions, well healed temporal cataract wounds, arcus, trace EBMD Mild arcus, trace PEE   Anterior Chamber Deep and quiet Deep and quiet   Iris Round and poorly dilated to 3.32mm Round and moderately dilated to 3mm   Lens PC IOL in good position, trace, cortical remnant temporally, PC folds 3+ Nuclear sclerosis with brunescense, 3+ Cortical cataract   Anterior Vitreous Vitreous syneresis, Ozurdex pellet remnants inferiorly, mild blood stained vitreous condensations -- now white -- slightly improved Vitreous syneresis         Fundus Exam       Right Left    Disc 2-3+Pallor, Sharp rim, temporal PPP/PPA, thin superior rim, +cupping, vascular loops, no heme Mild Pallor, Sharp rim, PPP, severely attenuated vessels   C/D Ratio 0.7 0.3   Macula Blunted foveal reflex, [place], Retinal pigment epithelial mottling, temporal cystic changes with exudates and MA -- improved, punctate exudates - improved, scattered DBH greatest inferior macula -- persistent, interval increase in IN IRF Flat, Blunted foveal reflex, RPE mottling and clumping, scarring, atrophy, no heme   Vessels Severe attenuation, tortuosity Severe Vascular attenuation, +fibrosis   Periphery Attached, 360 DBH -- greatest posteriorly Attached, pavingstone and reticular degeneration inferiorly, scattered RPE atrophy, greatest peripapillary           Refraction     Wearing Rx       Sphere Cylinder Axis Add   Right -1.00 +0.75 180 +2.25   Left -0.50 +0.75 155 +2.25           IMAGING AND PROCEDURES  Imaging and Procedures for @TODAY @  OCT, Retina - OU - Both Eyes       Right Eye Quality was good. Central Foveal Thickness: 194. Progression has worsened. Findings include normal foveal contour, no SRF, intraretinal hyper-reflective material, intraretinal fluid, vitreomacular adhesion (Persistent IRF/IRHM IN mac -- slightly increased, +vitreous opacities).   Left Eye Quality was borderline. Central Foveal Thickness: 243. Progression has been stable. Findings include no SRF, abnormal foveal contour, subretinal hyper-reflective material, epiretinal membrane, intraretinal fluid, pigment epithelial detachment, outer retinal atrophy, preretinal fibrosis (Scattered IRF/cystic changes, persistent central ORA / SRHM).   Notes *Images captured and stored on drive  Diagnosis / Impression:  OD: Persistent IRF/IRHM IN mac -- slightly increased, +vitreous opacities OS: Scattered IRF/cystic  changes, persistent central ORA / SRHM  Clinical management:  See below  Abbreviations: NFP - Normal  foveal profile. CME - cystoid macular edema. PED - pigment epithelial detachment. IRF - intraretinal fluid. SRF - subretinal fluid. EZ - ellipsoid zone. ERM - epiretinal membrane. ORA - outer retinal atrophy. ORT - outer retinal tubulation. SRHM - subretinal hyper-reflective material      Intravitreal Injection, Pharmacologic Agent - OD - Right Eye       Time Out 04/12/2022. 2:15 PM. Confirmed correct patient, procedure, site, and patient consented.   Anesthesia Topical anesthesia was used. Anesthetic medications included Lidocaine 2%, Proparacaine 0.5%.   Procedure Preparation included 5% betadine to ocular surface, eyelid speculum. A (32g) needle was used.   Injection: 2 mg aflibercept 2 MG/0.05ML   Route: Intravitreal, Site: Right Eye   NDC: [id number], Lot: [id number], Expiration date: 04/14/2023, Waste: 0 mL   Post-op Post injection exam found visual acuity of at least counting fingers, no retinal detachment, perfused optic nerve. The patient tolerated the procedure well. There were no complications. The patient received written and verbal post procedure care education.             ASSESSMENT/PLAN:    ICD-10-CM   1. Central retinal vein occlusion with macular edema of both eyes  H34.8130 OCT, Retina - OU - Both Eyes    Intravitreal Injection, Pharmacologic Agent - OD - Right Eye    aflibercept (EYLEA) SOLN 2 mg    2. Severe nonproliferative diabetic retinopathy of both eyes with macular edema associated with type 2 diabetes mellitus ([place])  [id number]     3. Essential hypertension  I10     4. Hypertensive retinopathy of both eyes  H35.033     5. 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 Pseudophakia  Z96.1     7. Primary open angle glaucoma (POAG) of right eye, mild stage  H40.1111      1. CRVO w/ CME OU  - delayed f/u - 5 wks instead of 4  - pt of Dr. Zigmund Daniel who wished to transfer care - OS long-standing low vision (CF) with  subfoveal scar and central retinal atrophy - OD with CME that was actively being managed with Ozurdex OD ~q5 wks by Dr. Zigmund Daniel (last on 8.20.20) s/p multiple IVTA and Ozurdex OD - was due for Ozurdex OD w/ Zigmund Daniel on 9.25.20, but underwent cataract surgery OD w/ Kathlen Mody on 9.21.20 - s/p IVTA OD #4 (10.26.20), #5 (12.10.20), #6 (01.21.21), #7 (03.05.21), #8 (04.16.21), #9 (05.28.21), #10 (07.09.21), #11 (09.14.21), #12 (10.29.21), #13 (12.17.21), #14 (01.27.22), #15 (03.11.2022), #16 (05.04.22), #17 (06.17.22), #18 (07.29.22), #19 (09.16.22), #20 (10.28.22), #21 (12.16.22), #22 (02.03.23) #23 (04.07.23), #24 (06.14.23) - s/p IVA OD #1 (07.17.23), #2 (08.14.23), #3 (09.14.23), #4 (10.18.23) #5 (12.01.24) - s/p IVE OD #1 (01.26.24), #2 (02.23.24)  - BCVA OD 20/100 from 20/150  - exam shows mild improvement in blood stained vit condensations - OCT OD: Persistent IRF/IRHM IN mac -- slightly increased, +vitreous opacities; OS: Scattered IRF/cystic changes, persistent central ORA / SRHM at 5 wks  - recommend IVE OD #3 today, 03.29.24 with follow up in 4 weeks  - pt wishes to proceed with injection  - RBA of procedure discussed, questions answered - see procedure note  - IVE informed consent obtained and signed, 01.26.24 (OD) - IVA informed consent obtained and signed, 07.17.23 (OD)  - IVTA informed consent obtained and re-signed on 06.14.23  -  f/u 4 weeks -- DFE/OCT/possible injection  2. Severe Non-proliferative diabetic retinopathy OU.  - OCT shows diabetic macular edema, OD -- slightly increased  - s/p IVTA OD on 06.14.23 as above  - s/p IVA OD #1 on 07.17.23 as above for DME as well as CRVO  - IVE OD #2 today 02.23.24 as above  3,4. Hypertensive retinopathy OU  - discussed importance of tight BP control  - continue to monitor  5. Mixed form age related cataract OS  - under the expert management of [facility]  6. Pseudophakia OD  - s/p CE/IOL OD (9.21.20) by expert surgeon, Dr.  Kathlen Mody  - IOL in excellent position  - had post op IOP spike -- IOP now under better control  - continue to monitor  7. POAG OD  - now under the expert management of Dr. Edilia Bo (initial consult 07.27.23)  - s/p SLT and goniotomy OD w/ Dr. Kathlen Mody  - had post-cataract IOP spike  - on Brimonidine TID OU and Cosopt BID OU  - IOP 11 today  - AC taps post IVTA injections   Ophthalmic Meds Ordered this visit:  Meds ordered this encounter  Medications   aflibercept (EYLEA) SOLN 2 mg     Return in about 4 weeks (around 05/10/2022) for RVO OU, Dilated Exam, OCT, Possible Injxn.  There are no Patient Instructions on file for this visit.   This document serves as a record of services personally performed by Gardiner Sleeper, MD, PhD. It was created on their behalf by San Jetty. Owens Shark, OA an ophthalmic technician. The creation of this record is the provider's dictation and/or activities during the visit.    Electronically signed by: San Jetty. Owens Shark, [place] 03.27.2024 12:02 AM   Gardiner Sleeper, M.D., Ph.D. Diseases & Surgery of the Retina and Vitreous [facility]  I have reviewed the above documentation for accuracy and completeness, and I agree with the above. Gardiner Sleeper, M.D., Ph.D. 04/13/22 12:07 AM  Abbreviations: M myopia (nearsighted); A astigmatism; H hyperopia (farsighted); P presbyopia; Mrx spectacle prescription;  CTL contact lenses; OD right eye; OS left eye; OU both eyes  XT exotropia; ET esotropia; PEK punctate epithelial keratitis; PEE punctate epithelial erosions; DES dry eye syndrome; MGD meibomian gland dysfunction; ATs artificial tears; PFAT's preservative free artificial tears; [place] nuclear sclerotic cataract; PSC posterior subcapsular cataract; ERM epi-retinal membrane; PVD posterior vitreous detachment; RD retinal detachment; DM diabetes mellitus; DR diabetic retinopathy; NPDR non-proliferative diabetic retinopathy; PDR proliferative diabetic retinopathy;  CSME clinically significant macular edema; DME diabetic macular edema; dbh dot blot hemorrhages; CWS cotton wool spot; POAG primary open angle glaucoma; C/D cup-to-disc ratio; HVF humphrey visual field; GVF goldmann visual field; OCT optical coherence tomography; IOP intraocular pressure; BRVO Branch retinal vein occlusion; CRVO central retinal vein occlusion; CRAO central retinal artery occlusion; BRAO branch retinal artery occlusion; RT retinal tear; SB scleral buckle; PPV pars plana vitrectomy; VH Vitreous hemorrhage; PRP panretinal laser photocoagulation; IVK intravitreal kenalog; VMT vitreomacular traction; MH Macular hole;  NVD neovascularization of the disc; NVE neovascularization elsewhere; AREDS age related eye disease study; ARMD age related macular degeneration; POAG primary open angle glaucoma; EBMD epithelial/anterior basement membrane dystrophy; ACIOL anterior chamber intraocular lens; IOL intraocular lens; PCIOL posterior chamber intraocular lens; Phaco/IOL phacoemulsification with intraocular lens placement; [place] photorefractive keratectomy; LASIK laser assisted in situ keratomileusis; HTN hypertension; DM diabetes mellitus; COPD chronic obstructive pulmonary disease

## 2022-04-12 ENCOUNTER — Encounter (INDEPENDENT_AMBULATORY_CARE_PROVIDER_SITE_OTHER): Payer: Self-pay | Admitting: Ophthalmology

## 2022-04-12 ENCOUNTER — Ambulatory Visit (INDEPENDENT_AMBULATORY_CARE_PROVIDER_SITE_OTHER): Payer: Medicare (Managed Care) | Admitting: Ophthalmology

## 2022-04-12 DIAGNOSIS — H34813 Central retinal vein occlusion, bilateral, with macular edema: Secondary | ICD-10-CM | POA: Diagnosis not present

## 2022-04-12 DIAGNOSIS — H35033 Hypertensive retinopathy, bilateral: Secondary | ICD-10-CM

## 2022-04-12 DIAGNOSIS — H401111 Primary open-angle glaucoma, right eye, mild stage: Secondary | ICD-10-CM

## 2022-04-12 DIAGNOSIS — H25812 Combined forms of age-related cataract, left eye: Secondary | ICD-10-CM

## 2022-04-12 DIAGNOSIS — E113413 Type 2 diabetes mellitus with severe nonproliferative diabetic retinopathy with macular edema, bilateral: Secondary | ICD-10-CM | POA: Diagnosis not present

## 2022-04-12 DIAGNOSIS — Z961 Presence of intraocular lens: Secondary | ICD-10-CM

## 2022-04-12 DIAGNOSIS — I10 Essential (primary) hypertension: Secondary | ICD-10-CM | POA: Diagnosis not present

## 2022-04-12 MED ORDER — AFLIBERCEPT 2MG/0.05ML IZ SOLN FOR KALEIDOSCOPE
2.0000 mg | INTRAVITREAL | Status: AC | PRN
  Administered 2022-04-12: 2 mg via INTRAVITREAL

## 2022-04-13 NOTE — Progress Notes (Incomplete)
[facility] Note  04/12/2022     CHIEF COMPLAINT Patient presents for Retina Follow Up    HISTORY OF PRESENT ILLNESS: Mary [name] is a 77 y.o. female who presents to the clinic today for:   HPI     Retina Follow Up   Patient presents with  CRVO/BRVO.  In both eyes.  This started 4 weeks ago.  I, the attending physician,  performed the HPI with the patient and updated documentation appropriately.        Comments   Patient here for 4 weeks (5 weeks) for retina follow up for CRVO OU. Patient states vision terrible. Not good right now. No eye pain.       Last edited by Mary Caffey, MD on 04/12/2022  2:22 PM.      Patient states she cannot see  Referring physician: Merlene Laughter, MD No address on file  HISTORICAL INFORMATION:   Selected notes from the [facility] Transferred care from Dr. Zigmund Daniel   CURRENT MEDICATIONS: Current Outpatient Medications (Ophthalmic Drugs)  Medication Sig  . brimonidine (ALPHAGAN) 0.2 % ophthalmic solution INSTILL 1 DROP INTO BOTH EYES TWICE A DAY (Patient taking differently: Place 1 drop into both eyes 3 (three) times daily.)  . dorzolamide-timolol (COSOPT) 22.3-6.8 MG/ML ophthalmic solution Place 1 drop into the right eye 2 (two) times daily. (Patient taking differently: Place 1 drop into both eyes 2 (two) times daily.)  . timolol (BETIMOL) 0.25 % ophthalmic solution Place 1 drop into both eyes 2 (two) times daily.   No current facility-administered medications for this visit. (Ophthalmic Drugs)   Current Outpatient Medications (Other)  Medication Sig  . amLODipine (NORVASC) 5 MG tablet Take 1 tablet (5 mg total) by mouth daily.  [name] aspirin 81 MG chewable tablet Chew 81 mg by mouth daily.  Mary [name] Comfort Lancets 28G MISC   . benzonatate (TESSALON) 200 MG capsule Take 1 capsule (200 mg total) by mouth 3 (three) times daily.  . Blood Glucose Monitoring Suppl (ONETOUCH VERIO FLEX  SYSTEM) w/Device KIT Use as directed to check blood sugars 2 times per day dx: e11.65  . cetirizine (ZYRTEC) 10 MG tablet Take 1 tablet (10 mg total) by mouth daily.  . Cholecalciferol (VITAMIN D) 50 MCG (2000 UT) CAPS Take 2,000 Units by mouth daily at 6 (six) AM.  . Emollient (BAG BALM) OINT Apply 1 Application topically 4 (four) times daily as needed (itching).  . fluticasone (FLONASE) 50 MCG/ACT nasal spray SPRAY 1 SPRAY INTO EACH NOSTRIL EVERY DAY (Patient taking differently: Place 1 spray into both nostrils daily.)  . furosemide (LASIX) 40 MG tablet TAKE 1 TABLET BY MOUTH  DAILY  . glucose blood (ONETOUCH VERIO) test strip Use as directed to check blood sugars 2 times per day dx: e11.65  . HYDROcodone-acetaminophen (NORCO) 10-325 MG tablet Take 1 tablet by mouth 5 (five) times daily as needed for moderate pain.  . Ipratropium-Albuterol (COMBIVENT) 20-100 MCG/ACT AERS respimat Inhale 1 puff into the lungs 2 (two) times daily.  [name] levothyroxine (SYNTHROID) 125 MCG tablet Take 125 mcg by mouth daily before breakfast.  . loperamide (IMODIUM) 2 MG capsule Take 2 mg by mouth every 6 (six) hours as needed for diarrhea or loose stools.  . metoprolol succinate (TOPROL-XL) 50 MG 24 hr tablet TAKE 1 TABLET BY MOUTH  DAILY (Patient taking differently: Take 50 mg by mouth daily.)  . montelukast (SINGULAIR) 10 MG tablet Take 10 mg by  mouth at bedtime.  [name] nystatin cream (MYCOSTATIN) Apply 1 Application topically 2 (two) times daily as needed for dry skin.  [name] omeprazole (PRILOSEC) 20 MG capsule TAKE 1 CAPSULE BY MOUTH  DAILY (Patient taking differently: Take 20 mg by mouth daily.)  . OneTouch Delica Lancets [id number] MISC Use as directed to check blood sugars 2 times per day dx: e11.65  . pravastatin (PRAVACHOL) 40 MG tablet TAKE 1 TABLET BY MOUTH EVERY DAY (Patient taking differently: Take 80 mg by mouth daily.)  . telmisartan (MICARDIS) 80 MG tablet TAKE 1 TABLET BY MOUTH  DAILY (Patient taking differently: Take  80 mg by mouth daily.)  . triamcinolone (KENALOG) 0.1 % APPLY TO AFFECTED AREA TWICE A DAY (Patient taking differently: Apply 1 Application topically 2 (two) times daily as needed (rash).)   No current facility-administered medications for this visit. (Other)   REVIEW OF SYSTEMS: ROS   Positive for: Skin, Endocrine, Cardiovascular, Eyes Negative for: Constitutional, Gastrointestinal, Neurological, Genitourinary, Musculoskeletal, HENT, Respiratory, Psychiatric, Allergic/Imm, Heme/Lymph Last edited by Theodore Demark, COA on 04/12/2022  1:34 PM.      ALLERGIES No Known Allergies  PAST MEDICAL HISTORY Past Medical History:  Diagnosis Date  . Arthritis    osteoarthritis  . Cataract    OS  . Diabetes mellitus    Insulin dependent  . Diabetic retinopathy ([place])    NPDR OU  . Glaucoma    POAG OD  . H/O cardiovascular stress test    pt. reports 10/13- was normal per pt.   . Hyperlipemia   . Hypertension   . Hypertensive retinopathy    OU   Past Surgical History:  Procedure Laterality Date  . CARPAL TUNNEL RELEASE Bilateral   . CATARACT EXTRACTION Right 10/05/2018   Dr. Kathlen Mody  . EYE SURGERY Right    Cat Sx  . KNEE ARTHROSCOPY     right  . QUADRICEPS TENDON REPAIR  02/17/2012   Procedure: REPAIR QUADRICEP TENDON;  Surgeon: Vickey Huger, MD;  Location: [facility];  Service: Orthopedics;  Laterality: Right;  right quadriceps repair with latera release  . QUADRICEPS TENDON REPAIR Right 05/15/2012   Procedure: REPAIR QUADRICEP TENDON;  Surgeon: Vickey Huger, MD;  Location: [facility];  Service: Orthopedics;  Laterality: Right;  . TOTAL KNEE ARTHROPLASTY  10/21/2011   Procedure: TOTAL KNEE ARTHROPLASTY;  Surgeon: Rudean Haskell, MD;  Location: [facility];  Service: Orthopedics;  Laterality: Right;  . TUBAL LIGATION     FAMILY HISTORY Family History  Problem Relation Age of Onset  . Hypertension Mother   . Heart Problems Father   . Gout Father   . Arthritis Father    SOCIAL  HISTORY Social History   Tobacco Use  . Smoking status: Former    Packs/day: 1.00    Years: 30.00    Additional pack years: 0.00    Total pack years: 30.00    Types: Cigarettes    Start date: 01/15/1967    Quit date: 06/28/2014    Years since quitting: 7.7  . Smokeless tobacco: Never  Vaping Use  . Vaping Use: Never used  Substance Use Topics  . Alcohol use: No  . Drug use: Yes    Types: Hydrocodone       OPHTHALMIC EXAM: Base Eye Exam     Visual Acuity (Snellen - Linear)       Right Left   Dist [place] 20/100 HM   Dist ph Arnold NI NI  Tonometry (Tonopen, 1:31 PM)       Right Left   Pressure 11 11         Pupils       Dark Light Shape React APD   Right 2 1 Round Sluggish None   Left 2 1 Round Sluggish None         Visual Fields (Counting fingers)       Left Right     Full   Restrictions Total superior temporal, inferior temporal, superior nasal, inferior nasal deficiencies          Extraocular Movement       Right Left    Full, Ortho Full, Ortho         Neuro/Psych     Oriented x3: Yes   Mood/Affect: Normal         Dilation     Both eyes: 1.0% Mydriacyl, 2.5% Phenylephrine @ 1:31 PM           Slit Lamp and Fundus Exam     Slit Lamp Exam       Right Left   Lids/Lashes Dermatochalasis - upper lid, mild Meibomian gland dysfunction Dermatochalasis - upper lid, mild Meibomian gland dysfunction   Conjunctiva/Sclera Melanosis Melanosis   Cornea Trace, Punctate epithelial erosions, well healed temporal cataract wounds, arcus, trace EBMD Mild arcus, trace PEE   Anterior Chamber Deep and quiet Deep and quiet   Iris Round and poorly dilated to 3.49mm Round and moderately dilated to 89mm   Lens PC IOL in good position, trace, cortical remnant temporally, PC folds 3+ Nuclear sclerosis with brunescense, 3+ Cortical cataract   Anterior Vitreous Vitreous syneresis, Ozurdex pellet remnants inferiorly, mild blood stained vitreous condensations  -- now white -- slightly improved Vitreous syneresis         Fundus Exam       Right Left   Disc 2-3+Pallor, Sharp rim, temporal PPP/PPA, thin superior rim, +cupping, vascular loops, no heme Mild Pallor, Sharp rim, PPP, severely attenuated vessels   C/D Ratio 0.7 0.3   Macula Blunted foveal reflex, Drusen, Retinal pigment epithelial mottling, temporal cystic changes with exudates and MA -- improved, punctate exudates - improved, scattered DBH greatest inferior macula -- persistent, interval increase in IN IRF Flat, Blunted foveal reflex, RPE mottling and clumping, scarring, atrophy, no heme   Vessels Severe attenuation, tortuosity Severe Vascular attenuation, +fibrosis   Periphery Attached, 360 DBH -- greatest posteriorly Attached, pavingstone and reticular degeneration inferiorly, scattered RPE atrophy, greatest peripapillary           Refraction     Wearing Rx       Sphere Cylinder Axis Add   Right -1.00 +0.75 180 +2.25   Left -0.50 +0.75 155 +2.25           IMAGING AND PROCEDURES  Imaging and Procedures for @TODAY @  OCT, Retina - OU - Both Eyes       Right Eye Quality was good. Central Foveal Thickness: 194. Progression has worsened. Findings include normal foveal contour, no SRF, intraretinal hyper-reflective material, intraretinal fluid, vitreomacular adhesion (Persistent IRF/IRHM IN mac -- slightly increased, +vitreous opacities).   Left Eye Quality was borderline. Central Foveal Thickness: 243. Progression has been stable. Findings include no SRF, abnormal foveal contour, subretinal hyper-reflective material, epiretinal membrane, intraretinal fluid, pigment epithelial detachment, outer retinal atrophy, preretinal fibrosis (Scattered IRF/cystic changes, persistent central ORA / SRHM).   Notes *Images captured and stored on drive  Diagnosis / Impression:  OD: Persistent IRF/IRHM  IN mac -- slightly increased, +vitreous opacities OS: Scattered IRF/cystic changes,  persistent central ORA / SRHM  Clinical management:  See below  Abbreviations: NFP - Normal foveal profile. CME - cystoid macular edema. PED - pigment epithelial detachment. IRF - intraretinal fluid. SRF - subretinal fluid. EZ - ellipsoid zone. ERM - epiretinal membrane. ORA - outer retinal atrophy. ORT - outer retinal tubulation. SRHM - subretinal hyper-reflective material      Intravitreal Injection, Pharmacologic Agent - OD - Right Eye       Time Out 04/12/2022. 2:15 PM. Confirmed correct patient, procedure, site, and patient consented.   Anesthesia Topical anesthesia was used. Anesthetic medications included Lidocaine 2%, Proparacaine 0.5%.   Procedure Preparation included 5% betadine to ocular surface, eyelid speculum. A (32g) needle was used.   Injection: 2 mg aflibercept 2 MG/0.05ML   Route: Intravitreal, Site: Right Eye   NDC: [id number], Lot: [id number], Expiration date: 04/14/2023, Waste: 0 mL   Post-op Post injection exam found visual acuity of at least counting fingers, no retinal detachment, perfused optic nerve. The patient tolerated the procedure well. There were no complications. The patient received written and verbal post procedure care education.             ASSESSMENT/PLAN:    ICD-10-CM   1. Central retinal vein occlusion with macular edema of both eyes  H34.8130 OCT, Retina - OU - Both Eyes    Intravitreal Injection, Pharmacologic Agent - OD - Right Eye    aflibercept (EYLEA) SOLN 2 mg    2. Severe nonproliferative diabetic retinopathy of both eyes with macular edema associated with type 2 diabetes mellitus ([place])  [id number]     3. Essential hypertension  I10     4. Hypertensive retinopathy of both eyes  H35.033     5. 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 Pseudophakia  Z96.1     7. Primary open angle glaucoma (POAG) of right eye, mild stage  H40.1111      1. CRVO w/ CME OU  - pt of Dr. Zigmund Daniel who wished to  transfer care - OS long-standing low vision (CF) with subfoveal scar and central retinal atrophy - OD with CME that was actively being managed with Ozurdex OD ~q5 wks by Dr. Zigmund Daniel (last on 8.20.20) s/p multiple IVTA and Ozurdex OD - was due for Ozurdex OD w/ Zigmund Daniel on 9.25.20, but underwent cataract surgery OD w/ Kathlen Mody on 9.21.20 - s/p IVTA OD #4 (10.26.20), #5 (12.10.20), #6 (01.21.21), #7 (03.05.21), #8 (04.16.21), #9 (05.28.21), #10 (07.09.21), #11 (09.14.21), #12 (10.29.21), #13 (12.17.21), #14 (01.27.22), #15 (03.11.2022), #16 (05.04.22), #17 (06.17.22), #18 (07.29.22), #19 (09.16.22), #20 (10.28.22), #21 (12.16.22), #22 (02.03.23) #23 (04.07.23), #24 (06.14.23) - s/p IVA OD #1 (07.17.23), #2 (08.14.23), #3 (09.14.23), #4 (10.18.23) #5 (12.01.24) - s/p IVE OD #1 (01.26.24), #2 (02.23.24)  - BCVA OD 20/150 - decreased from 20/100  - exam shows mild improvement in blood stained vit condensations - OCT OD: interval increase in cystic changes inferior macula, interval improvement in IRF temporal macula, +vitreous opacities OS: Scattered IRF/cystic changes, persistent central ORA / SRHM  - recommend IVE OD #3 today, 03.29.24 with follow up in 4 weeks  - pt wishes to proceed with injection  - RBA of procedure discussed, questions answered - see procedure note  - IVE informed consent obtained and signed, 01.26.24 (OD) - IVA informed consent obtained and signed, 07.17.23 (OD)  - IVTA informed consent obtained and  re-signed on 06.14.23  - f/u 4 weeks -- DFE/OCT/possible injection  2. Severe Non-proliferative diabetic retinopathy OU.  - OCT shows diabetic macular edema, OD -- slightly increased  - s/p IVTA OD on 06.14.23 as above  - s/p IVA OD #1 on 07.17.23 as above for DME as well as CRVO  - IVE OD #2 today 02.23.24 as above  3,4. Hypertensive retinopathy OU  - discussed importance of tight BP control  - continue to monitor  5. Mixed form age related cataract OS  - under the expert  management of [facility]  6. Pseudophakia OD  - s/p CE/IOL OD (9.21.20) by expert surgeon, Dr. Kathlen Mody  - IOL in excellent position  - had post op IOP spike -- IOP now under better control  - continue to monitor  7. POAG OD  - now under the expert management of Dr. Edilia Bo (initial consult 07.27.23)  - s/p SLT and goniotomy OD w/ Dr. Kathlen Mody  - had post-cataract IOP spike  - on Brimonidine TID OU and Cosopt BID OU  - IOP 11 today  - AC taps post IVTA injections   Ophthalmic Meds Ordered this visit:  Meds ordered this encounter  Medications  . aflibercept (EYLEA) SOLN 2 mg     Return in about 4 weeks (around 05/10/2022) for RVO OU, Dilated Exam, OCT, Possible Injxn.  There are no Patient Instructions on file for this visit.   This document serves as a record of services personally performed by Gardiner Sleeper, MD, PhD. It was created on their behalf by San Jetty. Owens Shark, OA an ophthalmic technician. The creation of this record is the provider's dictation and/or activities during the visit.    Electronically signed by: San Jetty. Owens Shark, [place] 03.27.2024 12:02 AM   Gardiner Sleeper, M.D., Ph.D. Diseases & Surgery of the Retina and Vitreous [facility]: M myopia (nearsighted); A astigmatism; H hyperopia (farsighted); P presbyopia; Mrx spectacle prescription;  CTL contact lenses; OD right eye; OS left eye; OU both eyes  XT exotropia; ET esotropia; PEK punctate epithelial keratitis; PEE punctate epithelial erosions; DES dry eye syndrome; MGD meibomian gland dysfunction; ATs artificial tears; PFAT's preservative free artificial tears; [place] nuclear sclerotic cataract; PSC posterior subcapsular cataract; ERM epi-retinal membrane; PVD posterior vitreous detachment; RD retinal detachment; DM diabetes mellitus; DR diabetic retinopathy; NPDR non-proliferative diabetic retinopathy; PDR proliferative diabetic retinopathy; CSME clinically significant macular edema;  DME diabetic macular edema; dbh dot blot hemorrhages; CWS cotton wool spot; POAG primary open angle glaucoma; C/D cup-to-disc ratio; HVF humphrey visual field; GVF goldmann visual field; OCT optical coherence tomography; IOP intraocular pressure; BRVO Branch retinal vein occlusion; CRVO central retinal vein occlusion; CRAO central retinal artery occlusion; BRAO branch retinal artery occlusion; RT retinal tear; SB scleral buckle; PPV pars plana vitrectomy; VH Vitreous hemorrhage; PRP panretinal laser photocoagulation; IVK intravitreal kenalog; VMT vitreomacular traction; MH Macular hole;  NVD neovascularization of the disc; NVE neovascularization elsewhere; AREDS age related eye disease study; ARMD age related macular degeneration; POAG primary open angle glaucoma; EBMD epithelial/anterior basement membrane dystrophy; ACIOL anterior chamber intraocular lens; IOL intraocular lens; PCIOL posterior chamber intraocular lens; Phaco/IOL phacoemulsification with intraocular lens placement; [place] photorefractive keratectomy; LASIK laser assisted in situ keratomileusis; HTN hypertension; DM diabetes mellitus; COPD chronic obstructive pulmonary disease

## 2022-05-08 NOTE — Progress Notes (Addendum)
[facility] - Clinic Note  05/10/2022     CHIEF COMPLAINT Patient presents for Retina Follow Up    HISTORY OF PRESENT ILLNESS: Mary [name] is a 77 y.o. female who presents to the clinic today for:   HPI     Retina Follow Up   Patient presents with  CRVO/BRVO.  In both eyes.  Severity is moderate.  Duration of 4 weeks.  Since onset it is stable.  I, the attending physician,  performed the HPI with the patient and updated documentation appropriately.        Comments   Pt here for 4 wk ret f/u CRVO OU. Pt states VA is the same, no improvement.       Last edited by Rennis Chris, MD on 05/10/2022 12:00 PM.    Patient states vision the same. No improvement.   Referring physician: Kermit Balo, DO [address] [place],  [place] [address]  HISTORICAL INFORMATION:   Selected notes from the MEDICAL RECORD NUMBER Transferred care from Dr. Ashley Royalty   CURRENT MEDICATIONS: Current Outpatient Medications (Ophthalmic Drugs)  Medication Sig   brimonidine (ALPHAGAN) 0.2 % ophthalmic solution INSTILL 1 DROP INTO BOTH EYES TWICE A DAY (Patient taking differently: Place 1 drop into both eyes 3 (three) times daily.)   dorzolamide-timolol (COSOPT) 22.3-6.8 MG/ML ophthalmic solution Place 1 drop into the right eye 2 (two) times daily. (Patient taking differently: Place 1 drop into both eyes 2 (two) times daily.)   timolol (BETIMOL) 0.25 % ophthalmic solution Place 1 drop into both eyes 2 (two) times daily.   No current facility-administered medications for this visit. (Ophthalmic Drugs)   Current Outpatient Medications (Other)  Medication Sig   amLODipine (NORVASC) 5 MG tablet Take 1 tablet (5 mg total) by mouth daily.   aspirin 81 MG chewable tablet Chew 81 mg by mouth daily.   Assure Comfort Lancets 28G MISC    benzonatate (TESSALON) 200 MG capsule Take 1 capsule (200 mg total) by mouth 3 (three) times daily.   Blood Glucose Monitoring Suppl (ONETOUCH VERIO  FLEX SYSTEM) w/Device KIT Use as directed to check blood sugars 2 times per day dx: e11.65   cetirizine (ZYRTEC) 10 MG tablet Take 1 tablet (10 mg total) by mouth daily.   Cholecalciferol (VITAMIN D) 50 MCG (2000 UT) CAPS Take 2,000 Units by mouth daily at 6 (six) AM.   Emollient (BAG BALM) OINT Apply 1 Application topically 4 (four) times daily as needed (itching).   fluticasone (FLONASE) 50 MCG/ACT nasal spray SPRAY 1 SPRAY INTO EACH NOSTRIL EVERY DAY (Patient taking differently: Place 1 spray into both nostrils daily.)   furosemide (LASIX) 40 MG tablet TAKE 1 TABLET BY MOUTH  DAILY   glucose blood (ONETOUCH VERIO) test strip Use as directed to check blood sugars 2 times per day dx: e11.65   HYDROcodone-acetaminophen (NORCO) 10-325 MG tablet Take 1 tablet by mouth 5 (five) times daily as needed for moderate pain.   Ipratropium-Albuterol (COMBIVENT) 20-100 MCG/ACT AERS respimat Inhale 1 puff into the lungs 2 (two) times daily.   levothyroxine (SYNTHROID) 125 MCG tablet Take 125 mcg by mouth daily before breakfast.   loperamide (IMODIUM) 2 MG capsule Take 2 mg by mouth every 6 (six) hours as needed for diarrhea or loose stools.   metoprolol succinate (TOPROL-XL) 50 MG 24 hr tablet TAKE 1 TABLET BY MOUTH  DAILY (Patient taking differently: Take 50 mg by mouth daily.)   montelukast (SINGULAIR) 10 MG  tablet Take 10 mg by mouth at bedtime.   nystatin cream (MYCOSTATIN) Apply 1 Application topically 2 (two) times daily as needed for dry skin.   omeprazole (PRILOSEC) 20 MG capsule TAKE 1 CAPSULE BY MOUTH  DAILY (Patient taking differently: Take 20 mg by mouth daily.)   OneTouch Delica Lancets 33G MISC Use as directed to check blood sugars 2 times per day dx: e11.65   pravastatin (PRAVACHOL) 40 MG tablet TAKE 1 TABLET BY MOUTH EVERY DAY (Patient taking differently: Take 80 mg by mouth daily.)   telmisartan (MICARDIS) 80 MG tablet TAKE 1 TABLET BY MOUTH  DAILY (Patient taking differently: Take 80 mg by  mouth daily.)   triamcinolone (KENALOG) 0.1 % APPLY TO AFFECTED AREA TWICE A DAY (Patient taking differently: Apply 1 Application topically 2 (two) times daily as needed (rash).)   No current facility-administered medications for this visit. (Other)   REVIEW OF SYSTEMS: ROS   Positive for: Skin, Endocrine, Cardiovascular, Eyes Negative for: Constitutional, Gastrointestinal, Neurological, Genitourinary, Musculoskeletal, HENT, Respiratory, Psychiatric, Allergic/Imm, Heme/Lymph Last edited by Thompson Grayer, COT on 05/10/2022  9:01 AM.       ALLERGIES No Known Allergies  PAST MEDICAL HISTORY Past Medical History:  Diagnosis Date   Arthritis    osteoarthritis   Cataract    OS   Diabetes mellitus    Insulin dependent   Diabetic retinopathy (HCC)    NPDR OU   Glaucoma    POAG OD   H/O cardiovascular stress test    pt. reports 10/13- was normal per pt.    Hyperlipemia    Hypertension    Hypertensive retinopathy    OU   Past Surgical History:  Procedure Laterality Date   CARPAL TUNNEL RELEASE Bilateral    CATARACT EXTRACTION Right 10/05/2018   Dr. Alben Spittle   EYE SURGERY Right    Cat Sx   KNEE ARTHROSCOPY     right   QUADRICEPS TENDON REPAIR  02/17/2012   Procedure: REPAIR QUADRICEP TENDON;  Surgeon: Dannielle Huh, MD;  Location: [facility] OR;  Service: Orthopedics;  Laterality: Right;  right quadriceps repair with latera release   QUADRICEPS TENDON REPAIR Right 05/15/2012   Procedure: REPAIR QUADRICEP TENDON;  Surgeon: Dannielle Huh, MD;  Location: [facility] OR;  Service: Orthopedics;  Laterality: Right;   TOTAL KNEE ARTHROPLASTY  10/21/2011   Procedure: TOTAL KNEE ARTHROPLASTY;  Surgeon: Raymon Mutton, MD;  Location: [facility] OR;  Service: Orthopedics;  Laterality: Right;   TUBAL LIGATION     FAMILY HISTORY Family History  Problem Relation Age of Onset   Hypertension Mother    Heart Problems Father    Gout Father    Arthritis Father    SOCIAL HISTORY Social History   Tobacco Use    Smoking status: Former    Packs/day: 1.00    Years: 30.00    Additional pack years: 0.00    Total pack years: 30.00    Types: Cigarettes    Start date: 01/15/1967    Quit date: 06/28/2014    Years since quitting: 7.8   Smokeless tobacco: Never  Vaping Use   Vaping Use: Never used  Substance Use Topics   Alcohol use: No   Drug use: Yes    Types: Hydrocodone       OPHTHALMIC EXAM: Base Eye Exam     Visual Acuity (Snellen - Linear)       Right Left   Dist [place] 20/100 -2 HM   Dist ph [place] NI  Tonometry (Tonopen, 9:05 AM)       Right Left   Pressure 9 10         Pupils       Dark Light Shape React APD   Right 2 1 Round Sluggish None   Left 2 2 Round NR None         Visual Fields       Left Right     Full   Restrictions Total superior temporal, inferior temporal, superior nasal, inferior nasal deficiencies          Extraocular Movement       Right Left    Full LxT    -- -- --  --  --  -- -- --   -- -- --  --  --  -- -- --           Neuro/Psych     Oriented x3: Yes   Mood/Affect: Normal         Dilation     Both eyes: 1.0% Mydriacyl, 2.5% Phenylephrine @ 9:07 AM           Slit Lamp and Fundus Exam     Slit Lamp Exam       Right Left   Lids/Lashes Dermatochalasis - upper lid, mild Meibomian gland dysfunction Dermatochalasis - upper lid, mild Meibomian gland dysfunction   Conjunctiva/Sclera Melanosis Melanosis   Cornea Trace, Punctate epithelial erosions, well healed temporal cataract wounds, arcus, trace EBMD Mild arcus, trace PEE   Anterior Chamber Deep and quiet Deep and quiet   Iris Round and poorly dilated to 3.23mm Round and moderately dilated to 6mm   Lens PC IOL in good position, trace, cortical remnant temporally, PC folds 3+ Nuclear sclerosis with brunescense, 3+ Cortical cataract   Anterior Vitreous Vitreous syneresis, Ozurdex pellet remnants inferiorly, mild blood stained vitreous condensations -- now white --  slightly improved Vitreous syneresis         Fundus Exam       Right Left   Disc 2-3+Pallor, Sharp rim, temporal PPP/PPA, thin superior rim, +cupping, vascular loops, no heme Mild Pallor, Sharp rim, PPP, severely attenuated vessels   C/D Ratio 0.8 0.3   Macula Blunted foveal reflex, Drusen, Retinal pigment epithelial mottling, punctate exudates - improved, scattered DBH greatest inferior macula -- slightly increased, interval increase in IN edema Flat, Blunted foveal reflex, RPE mottling and clumping, scarring, atrophy, no heme   Vessels Severe attenuation, tortuosity Severe Vascular attenuation, +fibrosis   Periphery Attached, 360 DBH -- greatest posteriorly Attached, pavingstone and reticular degeneration inferiorly, scattered RPE atrophy, greatest peripapillary           IMAGING AND PROCEDURES  Imaging and Procedures for @TODAY @  OCT, Retina - OU - Both Eyes       Right Eye Quality was good. Central Foveal Thickness: 392. Progression has worsened. Findings include no SRF, abnormal foveal contour, intraretinal hyper-reflective material, intraretinal fluid, vitreomacular adhesion (Interval increase in IRF/IRHM IN mac).   Left Eye Quality was borderline. Central Foveal Thickness: 266. Progression has been stable. Findings include no SRF, abnormal foveal contour, subretinal hyper-reflective material, epiretinal membrane, intraretinal fluid, pigment epithelial detachment, outer retinal atrophy, preretinal fibrosis (Scattered IRF/cystic changes, persistent central ORA / SRHM).   Notes *Images captured and stored on drive  Diagnosis / Impression:  OD: interval increase in IRF/IRHM IN mac  OS: Scattered IRF/cystic changes, persistent central ORA / SRHM  Clinical management:  See below  Abbreviations: NFP - Normal foveal profile.  CME - cystoid macular edema. PED - pigment epithelial detachment. IRF - intraretinal fluid. SRF - subretinal fluid. EZ - ellipsoid zone. ERM - epiretinal  membrane. ORA - outer retinal atrophy. ORT - outer retinal tubulation. SRHM - subretinal hyper-reflective material      Intravitreal Injection, Pharmacologic Agent - OD - Right Eye       Time Out 05/10/2022. 10:10 AM. Confirmed correct patient, procedure, site, and patient consented.   Anesthesia Topical anesthesia was used. Anesthetic medications included Lidocaine 2%, Proparacaine 0.5%.   Procedure Preparation included 5% betadine to ocular surface, eyelid speculum. A (32g) needle was used.   Injection: 2 mg aflibercept 2 MG/0.05ML   Route: Intravitreal, Site: Right Eye   NDC: L6038910, Lot: [id number], Expiration date: 05/14/2023, Waste: 0 mL   Post-op Post injection exam found visual acuity of at least counting fingers, no retinal detachment, perfused optic nerve. The patient tolerated the procedure well. There were no complications. The patient received written and verbal post procedure care education.            ASSESSMENT/PLAN:    ICD-10-CM   1. Central retinal vein occlusion with macular edema of both eyes  H34.8130 OCT, Retina - OU - Both Eyes    Intravitreal Injection, Pharmacologic Agent - OD - Right Eye    aflibercept (EYLEA) SOLN 2 mg    2. Severe nonproliferative diabetic retinopathy of both eyes with macular edema associated with type 2 diabetes mellitus (HCC)  W29.5621     3. Essential hypertension  I10     4. Hypertensive retinopathy of both eyes  H35.033     5. 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 Pseudophakia  Z96.1     7. Primary open angle glaucoma (POAG) of right eye, mild stage  H40.1111      1. CRVO w/ CME OU  - pt of Dr. Ashley Royalty who wished to transfer care - OS long-standing low vision (CF) with subfoveal scar and central retinal atrophy - OD with CME that was actively being managed with Ozurdex OD ~q5 wks by Dr. Ashley Royalty (last on 8.20.20) s/p multiple IVTA and Ozurdex OD - was due for Ozurdex OD w/  Ashley Royalty on 9.25.20, but underwent cataract surgery OD w/ Alben Spittle on 9.21.20 - s/p IVTA OD #4 (10.26.20), #5 (12.10.20), #6 (01.21.21), #7 (03.05.21), #8 (04.16.21), #9 (05.28.21), #10 (07.09.21), #11 (09.14.21), #12 (10.29.21), #13 (12.17.21), #14 (01.27.22), #15 (03.11.2022), #16 (05.04.22), #17 (06.17.22), #18 (07.29.22), #19 (09.16.22), #20 (10.28.22), #21 (12.16.22), #22 (02.03.23) #23 (04.07.23), #24 (06.14.23) - s/p IVA OD #1 (07.17.23), #2 (08.14.23), #3 (09.14.23), #4 (10.18.23) #5 (12.01.24) - s/p IVE OD #1 (01.26.24), #2 (02.23.24), #3 (03.29.24)  - BCVA OD 20/100--stable  - exam shows stable improvement in blood stained vit condensations - OCT OD: interval increase in IRF/IRHM IN mac; OS: Scattered IRF/cystic changes, persistent central ORA / SRHM at 4 wks - long discussion with pt about importance of regular f/u and treatment for the prevention of further vision loss, even if we are seeing limited improvement in vision  - recommend IVE OD #4 today, 04.26.24, w/ f/u in 4 wks  - pt wishes to proceed with injection  - RBA of procedure discussed, questions answered - see procedure note  - IVE informed consent obtained and signed, 01.26.24 (OD) - IVA informed consent obtained and signed, 07.17.23 (OD)  - IVTA informed consent obtained and re-signed on 06.14.23 - consider Vabysmo OD next time -- will  check insurance authorization  - f/u 4 weeks -- DFE/OCT/possible injection  2. Severe Non-proliferative diabetic retinopathy OU.  - OCT shows diabetic macular edema, OD -- slightly increased  - s/p IVTA OD on 06.14.23 as above  - s/p IVA OD #1 on 07.17.23 as above for DME as well as CRVO  - IVE OD #4 today 04.26.24 as above, but consider vabysmo OD next time  3,4. Hypertensive retinopathy OU  - discussed importance of tight BP control  - continue to monitor  5. Mixed form age related cataract OS  - under the expert management of [facility]  6. Pseudophakia OD  - s/p CE/IOL OD  (9.21.20) by expert surgeon, Dr. Alben Spittle  - IOL in excellent position  - had post op IOP spike -- IOP now under better control  - continue to monitor  7. POAG OD  - now under the expert management of Dr. Lottie Dawson (initial consult 07.27.23)  - s/p SLT and goniotomy OD w/ Dr. Alben Spittle  - had post-cataract IOP spike  - on Brimonidine TID OU and Cosopt BID OU  - IOP 09 today   Ophthalmic Meds Ordered this visit:  Meds ordered this encounter  Medications   aflibercept (EYLEA) SOLN 2 mg     Return in about 4 weeks (around 06/07/2022) for DFE, OCT.  There are no Patient Instructions on file for this visit.   This document serves as a record of services personally performed by Karie Chimera, MD, PhD. It was created on their behalf by Glee Arvin. Manson Passey, OA an ophthalmic technician. The creation of this record is the provider's dictation and/or activities during the visit.    Electronically signed by: Glee Arvin. Manson Passey, [place] 04.24.2024 12:04 PM  Karie Chimera, M.D., Ph.D. Diseases & Surgery of the Retina and Vitreous [facility]  I have reviewed the above documentation for accuracy and completeness, and I agree with the above. Karie Chimera, M.D., Ph.D. 05/10/22 12:04 PM   Abbreviations: M myopia (nearsighted); A astigmatism; H hyperopia (farsighted); P presbyopia; Mrx spectacle prescription;  CTL contact lenses; OD right eye; OS left eye; OU both eyes  XT exotropia; ET esotropia; PEK punctate epithelial keratitis; PEE punctate epithelial erosions; DES dry eye syndrome; MGD meibomian gland dysfunction; ATs artificial tears; PFAT's preservative free artificial tears; NSC nuclear sclerotic cataract; PSC posterior subcapsular cataract; ERM epi-retinal membrane; PVD posterior vitreous detachment; RD retinal detachment; DM diabetes mellitus; DR diabetic retinopathy; NPDR non-proliferative diabetic retinopathy; PDR proliferative diabetic retinopathy; CSME clinically significant  macular edema; DME diabetic macular edema; dbh dot blot hemorrhages; CWS cotton wool spot; POAG primary open angle glaucoma; C/D cup-to-disc ratio; HVF humphrey visual field; GVF goldmann visual field; OCT optical coherence tomography; IOP intraocular pressure; BRVO Branch retinal vein occlusion; CRVO central retinal vein occlusion; CRAO central retinal artery occlusion; BRAO branch retinal artery occlusion; RT retinal tear; SB scleral buckle; PPV pars plana vitrectomy; VH Vitreous hemorrhage; PRP panretinal laser photocoagulation; IVK intravitreal kenalog; VMT vitreomacular traction; MH Macular hole;  NVD neovascularization of the disc; NVE neovascularization elsewhere; AREDS age related eye disease study; ARMD age related macular degeneration; POAG primary open angle glaucoma; EBMD epithelial/anterior basement membrane dystrophy; ACIOL anterior chamber intraocular lens; IOL intraocular lens; PCIOL posterior chamber intraocular lens; Phaco/IOL phacoemulsification with intraocular lens placement; PRK photorefractive keratectomy; LASIK laser assisted in situ keratomileusis; HTN hypertension; DM diabetes mellitus; COPD chronic obstructive pulmonary disease

## 2022-05-10 ENCOUNTER — Encounter (INDEPENDENT_AMBULATORY_CARE_PROVIDER_SITE_OTHER): Payer: Self-pay | Admitting: Ophthalmology

## 2022-05-10 ENCOUNTER — Ambulatory Visit (INDEPENDENT_AMBULATORY_CARE_PROVIDER_SITE_OTHER): Payer: Medicare (Managed Care) | Admitting: Ophthalmology

## 2022-05-10 DIAGNOSIS — E113413 Type 2 diabetes mellitus with severe nonproliferative diabetic retinopathy with macular edema, bilateral: Secondary | ICD-10-CM | POA: Diagnosis not present

## 2022-05-10 DIAGNOSIS — Z961 Presence of intraocular lens: Secondary | ICD-10-CM

## 2022-05-10 DIAGNOSIS — H401111 Primary open-angle glaucoma, right eye, mild stage: Secondary | ICD-10-CM

## 2022-05-10 DIAGNOSIS — H35033 Hypertensive retinopathy, bilateral: Secondary | ICD-10-CM

## 2022-05-10 DIAGNOSIS — H25812 Combined forms of age-related cataract, left eye: Secondary | ICD-10-CM

## 2022-05-10 DIAGNOSIS — I10 Essential (primary) hypertension: Secondary | ICD-10-CM | POA: Diagnosis not present

## 2022-05-10 DIAGNOSIS — H34813 Central retinal vein occlusion, bilateral, with macular edema: Secondary | ICD-10-CM | POA: Diagnosis not present

## 2022-05-10 MED ORDER — AFLIBERCEPT 2MG/0.05ML IZ SOLN FOR KALEIDOSCOPE
2.0000 mg | INTRAVITREAL | Status: AC | PRN
  Administered 2022-05-10: 2 mg via INTRAVITREAL

## 2022-06-05 ENCOUNTER — Other Ambulatory Visit: Payer: Medicare Other

## 2022-06-06 NOTE — Progress Notes (Signed)
[facility] - Clinic Note  06/07/2022     CHIEF COMPLAINT Patient presents for Retina Follow Up    HISTORY OF PRESENT ILLNESS: Mary [name] is a 77 y.o. female who presents to the clinic today for:   HPI     Retina Follow Up   Patient presents with  CRVO/BRVO.  In both eyes.  This started 4 weeks ago.  Duration of 4 weeks.  Since onset it is stable.  I, the attending physician,  performed the HPI with the patient and updated documentation appropriately.        Comments   4 week retina follow up CRVO and I'VE OD pt is reporting vision is stable she denies any flashes or floaters       Last edited by Rennis Chris, MD on 06/07/2022  3:21 PM.     Patient states vision the same. No improvement.   Referring physician: No referring provider defined for this encounter.  HISTORICAL INFORMATION:   Selected notes from the MEDICAL RECORD NUMBER Transferred care from Dr. Ashley Royalty   CURRENT MEDICATIONS: Current Outpatient Medications (Ophthalmic Drugs)  Medication Sig   brimonidine (ALPHAGAN) 0.2 % ophthalmic solution INSTILL 1 DROP INTO BOTH EYES TWICE A DAY (Patient taking differently: Place 1 drop into both eyes 3 (three) times daily.)   dorzolamide-timolol (COSOPT) 22.3-6.8 MG/ML ophthalmic solution Place 1 drop into the right eye 2 (two) times daily. (Patient taking differently: Place 1 drop into both eyes 2 (two) times daily.)   timolol (BETIMOL) 0.25 % ophthalmic solution Place 1 drop into both eyes 2 (two) times daily.   No current facility-administered medications for this visit. (Ophthalmic Drugs)   Current Outpatient Medications (Other)  Medication Sig   amLODipine (NORVASC) 5 MG tablet Take 1 tablet (5 mg total) by mouth daily.   aspirin 81 MG chewable tablet Chew 81 mg by mouth daily.   Assure Comfort Lancets 28G MISC    benzonatate (TESSALON) 200 MG capsule Take 1 capsule (200 mg total) by mouth 3 (three) times daily.   Blood Glucose  Monitoring Suppl (ONETOUCH VERIO FLEX SYSTEM) w/Device KIT Use as directed to check blood sugars 2 times per day dx: e11.65   cetirizine (ZYRTEC) 10 MG tablet Take 1 tablet (10 mg total) by mouth daily.   Cholecalciferol (VITAMIN D) 50 MCG (2000 UT) CAPS Take 2,000 Units by mouth daily at 6 (six) AM.   Emollient (BAG BALM) OINT Apply 1 Application topically 4 (four) times daily as needed (itching).   fluticasone (FLONASE) 50 MCG/ACT nasal spray SPRAY 1 SPRAY INTO EACH NOSTRIL EVERY DAY (Patient taking differently: Place 1 spray into both nostrils daily.)   furosemide (LASIX) 40 MG tablet TAKE 1 TABLET BY MOUTH  DAILY   glucose blood (ONETOUCH VERIO) test strip Use as directed to check blood sugars 2 times per day dx: e11.65   HYDROcodone-acetaminophen (NORCO) 10-325 MG tablet Take 1 tablet by mouth 5 (five) times daily as needed for moderate pain.   Ipratropium-Albuterol (COMBIVENT) 20-100 MCG/ACT AERS respimat Inhale 1 puff into the lungs 2 (two) times daily.   levothyroxine (SYNTHROID) 125 MCG tablet Take 125 mcg by mouth daily before breakfast.   loperamide (IMODIUM) 2 MG capsule Take 2 mg by mouth every 6 (six) hours as needed for diarrhea or loose stools.   metoprolol succinate (TOPROL-XL) 50 MG 24 hr tablet TAKE 1 TABLET BY MOUTH  DAILY (Patient taking differently: Take 50 mg by mouth daily.)   montelukast (  SINGULAIR) 10 MG tablet Take 10 mg by mouth at bedtime.   nystatin cream (MYCOSTATIN) Apply 1 Application topically 2 (two) times daily as needed for dry skin.   omeprazole (PRILOSEC) 20 MG capsule TAKE 1 CAPSULE BY MOUTH  DAILY (Patient taking differently: Take 20 mg by mouth daily.)   OneTouch Delica Lancets 33G MISC Use as directed to check blood sugars 2 times per day dx: e11.65   pravastatin (PRAVACHOL) 40 MG tablet TAKE 1 TABLET BY MOUTH EVERY DAY (Patient taking differently: Take 80 mg by mouth daily.)   telmisartan (MICARDIS) 80 MG tablet TAKE 1 TABLET BY MOUTH  DAILY (Patient  taking differently: Take 80 mg by mouth daily.)   triamcinolone (KENALOG) 0.1 % APPLY TO AFFECTED AREA TWICE A DAY (Patient taking differently: Apply 1 Application topically 2 (two) times daily as needed (rash).)   No current facility-administered medications for this visit. (Other)   REVIEW OF SYSTEMS: ROS   Positive for: Skin, Endocrine, Cardiovascular, Eyes Negative for: Constitutional, Gastrointestinal, Neurological, Genitourinary, Musculoskeletal, HENT, Respiratory, Psychiatric, Allergic/Imm, Heme/Lymph Last edited by Etheleen Mayhew, COT on 06/07/2022  9:36 AM.        ALLERGIES No Known Allergies  PAST MEDICAL HISTORY Past Medical History:  Diagnosis Date   Arthritis    osteoarthritis   Cataract    OS   Diabetes mellitus    Insulin dependent   Diabetic retinopathy (HCC)    NPDR OU   Glaucoma    POAG OD   H/O cardiovascular stress test    pt. reports 10/13- was normal per pt.    Hyperlipemia    Hypertension    Hypertensive retinopathy    OU   Past Surgical History:  Procedure Laterality Date   CARPAL TUNNEL RELEASE Bilateral    CATARACT EXTRACTION Right 10/05/2018   Dr. Alben Spittle   EYE SURGERY Right    Cat Sx   KNEE ARTHROSCOPY     right   QUADRICEPS TENDON REPAIR  02/17/2012   Procedure: REPAIR QUADRICEP TENDON;  Surgeon: Dannielle Huh, MD;  Location: [facility] OR;  Service: Orthopedics;  Laterality: Right;  right quadriceps repair with latera release   QUADRICEPS TENDON REPAIR Right 05/15/2012   Procedure: REPAIR QUADRICEP TENDON;  Surgeon: Dannielle Huh, MD;  Location: [facility] OR;  Service: Orthopedics;  Laterality: Right;   TOTAL KNEE ARTHROPLASTY  10/21/2011   Procedure: TOTAL KNEE ARTHROPLASTY;  Surgeon: Raymon Mutton, MD;  Location: [facility] OR;  Service: Orthopedics;  Laterality: Right;   TUBAL LIGATION     FAMILY HISTORY Family History  Problem Relation Age of Onset   Hypertension Mother    Heart Problems Father    Gout Father    Arthritis Father    SOCIAL  HISTORY Social History   Tobacco Use   Smoking status: Former    Packs/day: 1.00    Years: 30.00    Additional pack years: 0.00    Total pack years: 30.00    Types: Cigarettes    Start date: 01/15/1967    Quit date: 06/28/2014    Years since quitting: 7.9   Smokeless tobacco: Never  Vaping Use   Vaping Use: Never used  Substance Use Topics   Alcohol use: No   Drug use: Yes    Types: Hydrocodone       OPHTHALMIC EXAM: Base Eye Exam     Visual Acuity (Snellen - Linear)       Right Left   Dist [place] 20/150 -2 HM  Tonometry (Tonopen, 9:40 AM)       Right Left   Pressure 10 12         Pupils       Pupils Dark Light Shape React APD   Right PERRL 2 1 Round Brisk None   Left PERRL 2 2 Round Brisk None         Visual Fields       Left Right     Full   Restrictions Total superior temporal, inferior temporal, superior nasal, inferior nasal deficiencies          Extraocular Movement       Right Left    Full, Ortho LXT, Ortho    -- -- --  --  --  -- -- --   -- -- --  --  --  -- -- --           Neuro/Psych     Oriented x3: Yes   Mood/Affect: Normal         Dilation     Both eyes: 2.5% Phenylephrine @ 9:40 AM           Slit Lamp and Fundus Exam     Slit Lamp Exam       Right Left   Lids/Lashes Dermatochalasis - upper lid, mild Meibomian gland dysfunction Dermatochalasis - upper lid, mild Meibomian gland dysfunction   Conjunctiva/Sclera Melanosis Melanosis   Cornea Trace, Punctate epithelial erosions, well healed temporal cataract wounds, arcus, trace EBMD Mild arcus, trace PEE   Anterior Chamber Deep and quiet Deep and quiet   Iris Round and poorly dilated to 3.47mm Round and moderately dilated to 6mm   Lens PC IOL in good position, trace, cortical remnant temporally, PC folds 3+ Nuclear sclerosis with brunescense, 3+ Cortical cataract   Anterior Vitreous Vitreous syneresis, Ozurdex pellet remnants inferiorly, mild blood stained  vitreous condensations -- now white -- slightly improved Vitreous syneresis         Fundus Exam       Right Left   Disc 2-3+Pallor, Sharp rim, temporal PPP/PPA, thin superior rim, +cupping, vascular loops, no heme Mild Pallor, Sharp rim, PPP, severely attenuated vessels   C/D Ratio 0.8 0.3   Macula Blunted foveal reflex, Drusen, Retinal pigment epithelial mottling, punctate exudates - improved, scattered DBH greatest inferior macula -- improved, interval improvement in IN edema Flat, Blunted foveal reflex, RPE mottling and clumping, scarring, atrophy, no heme   Vessels attenuated, Tortuous Severe Vascular attenuation, +fibrosis   Periphery Attached, 360 DBH -- greatest inferiorly Attached, pavingstone and reticular degeneration inferiorly, scattered RPE atrophy, greatest peripapillary           Refraction     Wearing Rx       Sphere Cylinder Axis Add   Right -1.00 +0.75 180 +2.25   Left -0.50 +0.75 155 +2.25           IMAGING AND PROCEDURES  Imaging and Procedures for @TODAY @  OCT, Retina - OU - Both Eyes       Right Eye Quality was good. Central Foveal Thickness: 250. Progression has improved. Findings include no SRF, abnormal foveal contour, intraretinal hyper-reflective material, intraretinal fluid, vitreomacular adhesion (Interval improvement in IRF/IRHM and foveal contour).   Left Eye Quality was borderline. Central Foveal Thickness: 266. Progression has been stable. Findings include no SRF, abnormal foveal contour, subretinal hyper-reflective material, epiretinal membrane, intraretinal fluid, pigment epithelial detachment, outer retinal atrophy, preretinal fibrosis (Scattered IRF/cystic changes, persistent central ORA / SRHM).  Notes *Images captured and stored on drive  Diagnosis / Impression:  OD: Interval improvement in IRF/IRHM and foveal contour OS: Scattered IRF/cystic changes, persistent central ORA / SRHM  Clinical management:  See  below  Abbreviations: NFP - Normal foveal profile. CME - cystoid macular edema. PED - pigment epithelial detachment. IRF - intraretinal fluid. SRF - subretinal fluid. EZ - ellipsoid zone. ERM - epiretinal membrane. ORA - outer retinal atrophy. ORT - outer retinal tubulation. SRHM - subretinal hyper-reflective material      Intravitreal Injection, Pharmacologic Agent - OD - Right Eye       Time Out 06/07/2022. 10:32 AM. Confirmed correct patient, procedure, site, and patient consented.   Anesthesia Topical anesthesia was used. Anesthetic medications included Lidocaine 2%, Proparacaine 0.5%.   Procedure Preparation included 5% betadine to ocular surface, eyelid speculum. A (32g) needle was used.   Injection: 2 mg aflibercept 2 MG/0.05ML   Route: Intravitreal, Site: Right Eye   NDC: L6038910, Lot: [id number], Expiration date: 05/14/2023, Waste: 0 mL   Post-op Post injection exam found visual acuity of at least counting fingers, no retinal detachment, perfused optic nerve. The patient tolerated the procedure well. There were no complications. The patient received written and verbal post procedure care education.            ASSESSMENT/PLAN:    ICD-10-CM   1. Central retinal vein occlusion with macular edema of both eyes  H34.8130 OCT, Retina - OU - Both Eyes    Intravitreal Injection, Pharmacologic Agent - OD - Right Eye    aflibercept (EYLEA) SOLN 2 mg    2. Severe nonproliferative diabetic retinopathy of both eyes with macular edema associated with type 2 diabetes mellitus (HCC)  W29.5621     3. Long-term (current) use of injectable non-insulin antidiabetic drugs  Z79.85     4. Essential hypertension  I10     5. Hypertensive retinopathy of both eyes  H35.033     6. Combined forms of age-related cataract of left eye  H25.812     7. Pseudophakia  Z96.1     8. Primary open angle glaucoma (POAG) of right eye, mild stage  H40.1111       1. CRVO w/ CME OU  - pt of Dr.  Ashley Royalty who wished to transfer care - OS long-standing low vision (CF) with subfoveal scar and central retinal atrophy - OD with CME that was actively being managed with Ozurdex OD ~q5 wks by Dr. Ashley Royalty (last on 8.20.20) s/p multiple IVTA and Ozurdex OD - was due for Ozurdex OD w/ Ashley Royalty on 9.25.20, but underwent cataract surgery OD w/ Alben Spittle on 9.21.20 - s/p IVTA OD #4 (10.26.20), #5 (12.10.20), #6 (01.21.21), #7 (03.05.21), #8 (04.16.21), #9 (05.28.21), #10 (07.09.21), #11 (09.14.21), #12 (10.29.21), #13 (12.17.21), #14 (01.27.22), #15 (03.11.2022), #16 (05.04.22), #17 (06.17.22), #18 (07.29.22), #19 (09.16.22), #20 (10.28.22), #21 (12.16.22), #22 (02.03.23) #23 (04.07.23), #24 (06.14.23) - s/p IVA OD #1 (07.17.23), #2 (08.14.23), #3 (09.14.23), #4 (10.18.23) #5 (12.01.24) - s/p IVE OD #1 (01.26.24), #2 (02.23.24), #3 (03.29.24), #4 (04.26.24)  - BCVA OD 20/150 from 20/100  - exam shows stable improvement in blood stained vit condensations - OCT HY:QMVHQION improvement in IRF/IRHM and foveal contour; OS: Scattered IRF/cystic changes, persistent central ORA / SRHM at 4 wks - long discussion with pt about importance of regular f/u and treatment for the prevention of further vision loss, even if we are seeing limited improvement in vision  - recommend IVE OD #5 today, 05.24.24,  w/ f/u in 4 wks  - pt wishes to proceed with injection  - RBA of procedure discussed, questions answered - see procedure note  - IVE informed consent obtained and signed, 01.26.24 (OD) - IVA informed consent obtained and signed, 07.17.23 (OD)  - IVTA informed consent obtained and re-signed on 06.14.23 - consider Vabysmo OD next time -- will check insurance authorization  - f/u 4 weeks -- DFE/OCT/possible injection  2,3. Severe Non-proliferative diabetic retinopathy OU.  - OCT shows diabetic macular edema, OD -- slightly increased  - s/p IVTA OD on 06.14.23 as above  - s/p IVA OD #1 on 07.17.23 as above for DME as  well as CRVO  - IVE OD #5 today as above, but consider vabysmo OD next time  4,5. Hypertensive retinopathy OU  - discussed importance of tight BP control  - continue to monitor  6. Mixed form age related cataract OS  - under the expert management of [facility]  7. Pseudophakia OD  - s/p CE/IOL OD (9.21.20) by expert surgeon, Dr. Alben Spittle  - IOL in excellent position  - had post op IOP spike -- IOP now under better control  - continue to monitor  8. POAG OD  - now under the expert management of Dr. Lottie Dawson (initial consult 07.27.23)  - s/p SLT and goniotomy OD w/ Dr. Alben Spittle  - had post-cataract IOP spike  - on Brimonidine TID OU and Cosopt BID OU  - IOP 10 today   Ophthalmic Meds Ordered this visit:  Meds ordered this encounter  Medications   aflibercept (EYLEA) SOLN 2 mg     Return in about 4 weeks (around 07/05/2022) for f/u CRVO OD, DFE, OCT.  There are no Patient Instructions on file for this visit.   This document serves as a record of services personally performed by Karie Chimera, MD, PhD. It was created on their behalf by Berlin Hun COT, an ophthalmic technician. The creation of this record is the provider's dictation and/or activities during the visit.    Electronically signed by: Berlin Hun COT 05.23.2024 2:10 AM  This document serves as a record of services personally performed by Karie Chimera, MD, PhD. It was created on their behalf by Glee Arvin. Manson Passey, OA an ophthalmic technician. The creation of this record is the provider's dictation and/or activities during the visit.    Electronically signed by: Glee Arvin. Manson Passey, [place] 05.24.2024 2:10 AM  Karie Chimera, M.D., Ph.D. Diseases & Surgery of the Retina and Vitreous [facility] 06/07/2022   I have reviewed the above documentation for accuracy and completeness, and I agree with the above. Karie Chimera, M.D., Ph.D. 06/10/22 2:12 AM   Abbreviations: M myopia  (nearsighted); A astigmatism; H hyperopia (farsighted); P presbyopia; Mrx spectacle prescription;  CTL contact lenses; OD right eye; OS left eye; OU both eyes  XT exotropia; ET esotropia; PEK punctate epithelial keratitis; PEE punctate epithelial erosions; DES dry eye syndrome; MGD meibomian gland dysfunction; ATs artificial tears; PFAT's preservative free artificial tears; NSC nuclear sclerotic cataract; PSC posterior subcapsular cataract; ERM epi-retinal membrane; PVD posterior vitreous detachment; RD retinal detachment; DM diabetes mellitus; DR diabetic retinopathy; NPDR non-proliferative diabetic retinopathy; PDR proliferative diabetic retinopathy; CSME clinically significant macular edema; DME diabetic macular edema; dbh dot blot hemorrhages; CWS cotton wool spot; POAG primary open angle glaucoma; C/D cup-to-disc ratio; HVF humphrey visual field; GVF goldmann visual field; OCT optical coherence tomography; IOP intraocular pressure; BRVO Branch retinal vein  occlusion; CRVO central retinal vein occlusion; CRAO central retinal artery occlusion; BRAO branch retinal artery occlusion; RT retinal tear; SB scleral buckle; PPV pars plana vitrectomy; VH Vitreous hemorrhage; PRP panretinal laser photocoagulation; IVK intravitreal kenalog; VMT vitreomacular traction; MH Macular hole;  NVD neovascularization of the disc; NVE neovascularization elsewhere; AREDS age related eye disease study; ARMD age related macular degeneration; POAG primary open angle glaucoma; EBMD epithelial/anterior basement membrane dystrophy; ACIOL anterior chamber intraocular lens; IOL intraocular lens; PCIOL posterior chamber intraocular lens; Phaco/IOL phacoemulsification with intraocular lens placement; PRK photorefractive keratectomy; LASIK laser assisted in situ keratomileusis; HTN hypertension; DM diabetes mellitus; COPD chronic obstructive pulmonary disease

## 2022-06-07 ENCOUNTER — Encounter (INDEPENDENT_AMBULATORY_CARE_PROVIDER_SITE_OTHER): Payer: Self-pay | Admitting: Ophthalmology

## 2022-06-07 ENCOUNTER — Ambulatory Visit (INDEPENDENT_AMBULATORY_CARE_PROVIDER_SITE_OTHER): Payer: Medicare (Managed Care) | Admitting: Ophthalmology

## 2022-06-07 DIAGNOSIS — Z7985 Long-term (current) use of injectable non-insulin antidiabetic drugs: Secondary | ICD-10-CM

## 2022-06-07 DIAGNOSIS — E113413 Type 2 diabetes mellitus with severe nonproliferative diabetic retinopathy with macular edema, bilateral: Secondary | ICD-10-CM

## 2022-06-07 DIAGNOSIS — I10 Essential (primary) hypertension: Secondary | ICD-10-CM

## 2022-06-07 DIAGNOSIS — Z961 Presence of intraocular lens: Secondary | ICD-10-CM

## 2022-06-07 DIAGNOSIS — H401111 Primary open-angle glaucoma, right eye, mild stage: Secondary | ICD-10-CM

## 2022-06-07 DIAGNOSIS — H34813 Central retinal vein occlusion, bilateral, with macular edema: Secondary | ICD-10-CM

## 2022-06-07 DIAGNOSIS — H35033 Hypertensive retinopathy, bilateral: Secondary | ICD-10-CM

## 2022-06-07 DIAGNOSIS — H25812 Combined forms of age-related cataract, left eye: Secondary | ICD-10-CM

## 2022-06-07 MED ORDER — AFLIBERCEPT 2MG/0.05ML IZ SOLN FOR KALEIDOSCOPE
2.0000 mg | INTRAVITREAL | Status: AC | PRN
  Administered 2022-06-07: 2 mg via INTRAVITREAL

## 2022-06-12 ENCOUNTER — Ambulatory Visit: Payer: Medicare Other | Admitting: Internal Medicine

## 2022-06-25 ENCOUNTER — Other Ambulatory Visit: Payer: Self-pay | Admitting: Internal Medicine

## 2022-06-25 DIAGNOSIS — M810 Age-related osteoporosis without current pathological fracture: Secondary | ICD-10-CM

## 2022-06-26 ENCOUNTER — Inpatient Hospital Stay: Admission: RE | Admit: 2022-06-26 | Payer: Medicare Other | Source: Ambulatory Visit

## 2022-07-05 ENCOUNTER — Encounter (INDEPENDENT_AMBULATORY_CARE_PROVIDER_SITE_OTHER): Payer: Medicare (Managed Care) | Admitting: Ophthalmology

## 2022-07-05 DIAGNOSIS — Z7985 Long-term (current) use of injectable non-insulin antidiabetic drugs: Secondary | ICD-10-CM

## 2022-07-05 DIAGNOSIS — Z961 Presence of intraocular lens: Secondary | ICD-10-CM

## 2022-07-05 DIAGNOSIS — H34813 Central retinal vein occlusion, bilateral, with macular edema: Secondary | ICD-10-CM

## 2022-07-05 DIAGNOSIS — E113413 Type 2 diabetes mellitus with severe nonproliferative diabetic retinopathy with macular edema, bilateral: Secondary | ICD-10-CM

## 2022-07-05 DIAGNOSIS — H401111 Primary open-angle glaucoma, right eye, mild stage: Secondary | ICD-10-CM

## 2022-07-05 DIAGNOSIS — H25812 Combined forms of age-related cataract, left eye: Secondary | ICD-10-CM

## 2022-07-05 DIAGNOSIS — I10 Essential (primary) hypertension: Secondary | ICD-10-CM

## 2022-07-05 DIAGNOSIS — H35033 Hypertensive retinopathy, bilateral: Secondary | ICD-10-CM

## 2022-07-09 ENCOUNTER — Encounter (INDEPENDENT_AMBULATORY_CARE_PROVIDER_SITE_OTHER): Payer: Medicare (Managed Care) | Admitting: Ophthalmology

## 2022-07-16 ENCOUNTER — Encounter (INDEPENDENT_AMBULATORY_CARE_PROVIDER_SITE_OTHER): Payer: Medicare (Managed Care) | Admitting: Ophthalmology

## 2022-07-16 DIAGNOSIS — Z7985 Long-term (current) use of injectable non-insulin antidiabetic drugs: Secondary | ICD-10-CM

## 2022-07-16 DIAGNOSIS — E113413 Type 2 diabetes mellitus with severe nonproliferative diabetic retinopathy with macular edema, bilateral: Secondary | ICD-10-CM

## 2022-07-16 DIAGNOSIS — H34813 Central retinal vein occlusion, bilateral, with macular edema: Secondary | ICD-10-CM

## 2022-07-16 DIAGNOSIS — I10 Essential (primary) hypertension: Secondary | ICD-10-CM

## 2022-07-16 DIAGNOSIS — H25812 Combined forms of age-related cataract, left eye: Secondary | ICD-10-CM

## 2022-07-16 DIAGNOSIS — H401111 Primary open-angle glaucoma, right eye, mild stage: Secondary | ICD-10-CM

## 2022-07-16 DIAGNOSIS — H35033 Hypertensive retinopathy, bilateral: Secondary | ICD-10-CM

## 2022-07-16 DIAGNOSIS — Z961 Presence of intraocular lens: Secondary | ICD-10-CM

## 2022-07-17 ENCOUNTER — Encounter (INDEPENDENT_AMBULATORY_CARE_PROVIDER_SITE_OTHER): Payer: Self-pay | Admitting: Ophthalmology

## 2022-07-17 ENCOUNTER — Ambulatory Visit (INDEPENDENT_AMBULATORY_CARE_PROVIDER_SITE_OTHER): Payer: Medicare (Managed Care) | Admitting: Ophthalmology

## 2022-07-17 DIAGNOSIS — E113413 Type 2 diabetes mellitus with severe nonproliferative diabetic retinopathy with macular edema, bilateral: Secondary | ICD-10-CM

## 2022-07-17 DIAGNOSIS — H34813 Central retinal vein occlusion, bilateral, with macular edema: Secondary | ICD-10-CM

## 2022-07-17 DIAGNOSIS — I10 Essential (primary) hypertension: Secondary | ICD-10-CM

## 2022-07-17 DIAGNOSIS — Z7985 Long-term (current) use of injectable non-insulin antidiabetic drugs: Secondary | ICD-10-CM

## 2022-07-17 DIAGNOSIS — H25812 Combined forms of age-related cataract, left eye: Secondary | ICD-10-CM

## 2022-07-17 DIAGNOSIS — H35033 Hypertensive retinopathy, bilateral: Secondary | ICD-10-CM

## 2022-07-17 DIAGNOSIS — H401111 Primary open-angle glaucoma, right eye, mild stage: Secondary | ICD-10-CM

## 2022-07-17 DIAGNOSIS — H4311 Vitreous hemorrhage, right eye: Secondary | ICD-10-CM

## 2022-07-17 DIAGNOSIS — Z961 Presence of intraocular lens: Secondary | ICD-10-CM

## 2022-07-17 MED ORDER — FARICIMAB-SVOA 6 MG/0.05ML IZ SOLN
6.0000 mg | INTRAVITREAL | Status: AC | PRN
  Administered 2022-07-17: 6 mg via INTRAVITREAL

## 2022-07-17 NOTE — Progress Notes (Signed)
[facility] - Clinic Note  07/17/2022     CHIEF COMPLAINT Patient presents for Retina Follow Up    HISTORY OF PRESENT ILLNESS: Mary [name] is a 77 y.o. female who presents to the clinic today for:   HPI     Retina Follow Up   Patient presents with  CRVO/BRVO.  In both eyes.  This started 5 weeks ago.  I, the attending physician,  performed the HPI with the patient and updated documentation appropriately.        Comments   Patient here for 4 weeks (5 weeks) retina follow up for CRVO OU. Patient states vision is worse. No better. No eye pain. Ran out of Brimonidine eye drops this past Monday.       Last edited by Rennis Chris, MD on 07/17/2022 11:52 AM.     Patient states vision the same. No improvement.   Referring physician: No referring provider defined for this encounter.  HISTORICAL INFORMATION:   Selected notes from the MEDICAL RECORD NUMBER Transferred care from Dr. Ashley Royalty   CURRENT MEDICATIONS: Current Outpatient Medications (Ophthalmic Drugs)  Medication Sig   brimonidine (ALPHAGAN) 0.2 % ophthalmic solution INSTILL 1 DROP INTO BOTH EYES TWICE A DAY (Patient taking differently: Place 1 drop into both eyes 3 (three) times daily.)   dorzolamide-timolol (COSOPT) 22.3-6.8 MG/ML ophthalmic solution Place 1 drop into the right eye 2 (two) times daily. (Patient taking differently: Place 1 drop into both eyes 2 (two) times daily.)   timolol (BETIMOL) 0.25 % ophthalmic solution Place 1 drop into both eyes 2 (two) times daily.   No current facility-administered medications for this visit. (Ophthalmic Drugs)   Current Outpatient Medications (Other)  Medication Sig   amLODipine (NORVASC) 5 MG tablet Take 1 tablet (5 mg total) by mouth daily.   aspirin 81 MG chewable tablet Chew 81 mg by mouth daily.   Assure Comfort Lancets 28G MISC    benzonatate (TESSALON) 200 MG capsule Take 1 capsule (200 mg total) by mouth 3 (three) times daily.   Blood  Glucose Monitoring Suppl (ONETOUCH VERIO FLEX SYSTEM) w/Device KIT Use as directed to check blood sugars 2 times per day dx: e11.65   cetirizine (ZYRTEC) 10 MG tablet Take 1 tablet (10 mg total) by mouth daily.   Cholecalciferol (VITAMIN D) 50 MCG (2000 UT) CAPS Take 2,000 Units by mouth daily at 6 (six) AM.   Emollient (BAG BALM) OINT Apply 1 Application topically 4 (four) times daily as needed (itching).   fluticasone (FLONASE) 50 MCG/ACT nasal spray SPRAY 1 SPRAY INTO EACH NOSTRIL EVERY DAY (Patient taking differently: Place 1 spray into both nostrils daily.)   furosemide (LASIX) 40 MG tablet TAKE 1 TABLET BY MOUTH  DAILY   glucose blood (ONETOUCH VERIO) test strip Use as directed to check blood sugars 2 times per day dx: e11.65   HYDROcodone-acetaminophen (NORCO) 10-325 MG tablet Take 1 tablet by mouth 5 (five) times daily as needed for moderate pain.   Ipratropium-Albuterol (COMBIVENT) 20-100 MCG/ACT AERS respimat Inhale 1 puff into the lungs 2 (two) times daily.   levothyroxine (SYNTHROID) 125 MCG tablet Take 125 mcg by mouth daily before breakfast.   loperamide (IMODIUM) 2 MG capsule Take 2 mg by mouth every 6 (six) hours as needed for diarrhea or loose stools.   metoprolol succinate (TOPROL-XL) 50 MG 24 hr tablet TAKE 1 TABLET BY MOUTH  DAILY (Patient taking differently: Take 50 mg by mouth daily.)   montelukast (SINGULAIR)  10 MG tablet Take 10 mg by mouth at bedtime.   nystatin cream (MYCOSTATIN) Apply 1 Application topically 2 (two) times daily as needed for dry skin.   omeprazole (PRILOSEC) 20 MG capsule TAKE 1 CAPSULE BY MOUTH  DAILY (Patient taking differently: Take 20 mg by mouth daily.)   OneTouch Delica Lancets 33G MISC Use as directed to check blood sugars 2 times per day dx: e11.65   pravastatin (PRAVACHOL) 40 MG tablet TAKE 1 TABLET BY MOUTH EVERY DAY (Patient taking differently: Take 80 mg by mouth daily.)   telmisartan (MICARDIS) 80 MG tablet TAKE 1 TABLET BY MOUTH  DAILY  (Patient taking differently: Take 80 mg by mouth daily.)   triamcinolone (KENALOG) 0.1 % APPLY TO AFFECTED AREA TWICE A DAY (Patient taking differently: Apply 1 Application topically 2 (two) times daily as needed (rash).)   No current facility-administered medications for this visit. (Other)   REVIEW OF SYSTEMS: ROS   Positive for: Skin, Endocrine, Cardiovascular, Eyes Negative for: Constitutional, Gastrointestinal, Neurological, Genitourinary, Musculoskeletal, HENT, Respiratory, Psychiatric, Allergic/Imm, Heme/Lymph Last edited by Laddie Aquas, COA on 07/17/2022  9:44 AM.     ALLERGIES No Known Allergies  PAST MEDICAL HISTORY Past Medical History:  Diagnosis Date   Arthritis    osteoarthritis   Cataract    OS   Diabetes mellitus    Insulin dependent   Diabetic retinopathy (HCC)    NPDR OU   Glaucoma    POAG OD   H/O cardiovascular stress test    pt. reports 10/13- was normal per pt.    Hyperlipemia    Hypertension    Hypertensive retinopathy    OU   Past Surgical History:  Procedure Laterality Date   CARPAL TUNNEL RELEASE Bilateral    CATARACT EXTRACTION Right 10/05/2018   Dr. Alben Spittle   EYE SURGERY Right    Cat Sx   KNEE ARTHROSCOPY     right   QUADRICEPS TENDON REPAIR  02/17/2012   Procedure: REPAIR QUADRICEP TENDON;  Surgeon: Dannielle Huh, MD;  Location: [facility] OR;  Service: Orthopedics;  Laterality: Right;  right quadriceps repair with latera release   QUADRICEPS TENDON REPAIR Right 05/15/2012   Procedure: REPAIR QUADRICEP TENDON;  Surgeon: Dannielle Huh, MD;  Location: [facility] OR;  Service: Orthopedics;  Laterality: Right;   TOTAL KNEE ARTHROPLASTY  10/21/2011   Procedure: TOTAL KNEE ARTHROPLASTY;  Surgeon: Raymon Mutton, MD;  Location: [facility] OR;  Service: Orthopedics;  Laterality: Right;   TUBAL LIGATION     FAMILY HISTORY Family History  Problem Relation Age of Onset   Hypertension Mother    Heart Problems Father    Gout Father    Arthritis Father    SOCIAL  HISTORY Social History   Tobacco Use   Smoking status: Former    Packs/day: 1.00    Years: 30.00    Additional pack years: 0.00    Total pack years: 30.00    Types: Cigarettes    Start date: 01/15/1967    Quit date: 06/28/2014    Years since quitting: 8.0   Smokeless tobacco: Never  Vaping Use   Vaping Use: Never used  Substance Use Topics   Alcohol use: No   Drug use: Yes    Types: Hydrocodone       OPHTHALMIC EXAM: Base Eye Exam     Visual Acuity (Snellen - Linear)       Right Left   Dist cc 20/150 HM   Dist ph cc NI NI  Correction: Glasses         Tonometry (Tonopen, 9:40 AM)       Right Left   Pressure 15 16         Pupils       Dark Light Shape React APD   Right 2 1 Round Sluggish None   Left 2 2 Round NR None         Visual Fields (Counting fingers)       Left Right     Full   Restrictions Total superior temporal, inferior temporal, superior nasal, inferior nasal deficiencies          Extraocular Movement       Right Left    Full, Ortho LXT, Ortho    -- -- --  --  --  -- -- --   -- -- --  --  --  -- -- --           Neuro/Psych     Oriented x3: Yes   Mood/Affect: Normal         Dilation     Both eyes: 1.0% Mydriacyl, 2.5% Phenylephrine @ 9:40 AM           Slit Lamp and Fundus Exam     Slit Lamp Exam       Right Left   Lids/Lashes Dermatochalasis - upper lid, mild Meibomian gland dysfunction Dermatochalasis - upper lid, mild Meibomian gland dysfunction   Conjunctiva/Sclera Melanosis Melanosis   Cornea Trace, Punctate epithelial erosions, well healed temporal cataract wounds, arcus, trace EBMD Mild arcus, trace PEE   Anterior Chamber Deep and quiet Deep and quiet   Iris Round and poorly dilated to 3.86mm Round and moderately dilated to 6mm   Lens PC IOL in good position, trace, cortical remnant temporally, PC folds 3+ Nuclear sclerosis with brunescense, 3+ Cortical cataract   Anterior Vitreous Vitreous  syneresis, Ozurdex pellet remnants inferiorly, mild blood stained vitreous condensations -- now white -- slightly improved Vitreous syneresis         Fundus Exam       Right Left   Disc 2-3+Pallor, Sharp rim, temporal PPP/PPA, thin superior rim, +cupping, vascular loops, no heme Mild Pallor, Sharp rim, PPP, severely attenuated vessels   C/D Ratio 0.9 0.3   Macula Blunted foveal reflex, Drusen, Retinal pigment epithelial mottling, punctate exudates - improved, scattered DBH greatest inferior macula, interval increase in IN edema Flat, Blunted foveal reflex, RPE mottling and clumping, scarring, atrophy, no heme   Vessels attenuated, Tortuous Severe Vascular attenuation, +fibrosis   Periphery Attached, 360 DBH -- greatest inferiorly Attached, pavingstone and reticular degeneration inferiorly, scattered RPE atrophy, greatest peripapillary           Refraction     Wearing Rx       Sphere Cylinder Axis Add   Right -1.00 +0.75 180 +2.25   Left -0.50 +0.75 155 +2.25           IMAGING AND PROCEDURES  Imaging and Procedures for @TODAY @  OCT, Retina - OU - Both Eyes       Right Eye Quality was good. Central Foveal Thickness: 315. Progression has worsened. Findings include no SRF, abnormal foveal contour, intraretinal hyper-reflective material, intraretinal fluid, vitreomacular adhesion (Interval increase in IRF/IRHM greatest IN mac).   Left Eye Quality was borderline. Central Foveal Thickness: 254. Progression has been stable. Findings include no SRF, abnormal foveal contour, subretinal hyper-reflective material, epiretinal membrane, intraretinal fluid, pigment epithelial detachment, outer retinal atrophy, preretinal fibrosis (  Scattered IRF/cystic changes, persistent central ORA / SRHM).   Notes *Images captured and stored on drive  Diagnosis / Impression:  OD: Interval increase in IRF/IRHM greatest IN mac OS: Scattered IRF/cystic changes, persistent central ORA /  SRHM  Clinical management:  See below  Abbreviations: NFP - Normal foveal profile. CME - cystoid macular edema. PED - pigment epithelial detachment. IRF - intraretinal fluid. SRF - subretinal fluid. EZ - ellipsoid zone. ERM - epiretinal membrane. ORA - outer retinal atrophy. ORT - outer retinal tubulation. SRHM - subretinal hyper-reflective material      Intravitreal Injection, Pharmacologic Agent - OD - Right Eye       Time Out 07/17/2022. 11:03 AM. Confirmed correct patient, procedure, site, and patient consented.   Anesthesia Topical anesthesia was used. Anesthetic medications included Lidocaine 2%, Proparacaine 0.5%.   Procedure Preparation included 5% betadine to ocular surface, eyelid speculum. A (32g) needle was used.   Injection: 6 mg faricimab-svoa 6 MG/0.05ML   Route: Intravitreal, Site: Right Eye   NDC: [phone], Lot: [id number], Expiration date: 07/13/2024, Waste: 0 mL   Post-op Post injection exam found visual acuity of at least counting fingers, no retinal detachment, perfused optic nerve. The patient tolerated the procedure well. There were no complications. The patient received written and verbal post procedure care education.            ASSESSMENT/PLAN:    ICD-10-CM   1. Central retinal vein occlusion with macular edema of both eyes  H34.8130 OCT, Retina - OU - Both Eyes    Intravitreal Injection, Pharmacologic Agent - OD - Right Eye    faricimab-svoa (VABYSMO) 6mg /0.45mL intravitreal injection    2. Severe nonproliferative diabetic retinopathy of both eyes with macular edema associated with type 2 diabetes mellitus (HCC)  Y86.5784     3. Long-term (current) use of injectable non-insulin antidiabetic drugs  Z79.85     4. Essential hypertension  I10     5. Hypertensive retinopathy of both eyes  H35.033     6. Combined forms of age-related cataract of left eye  H25.812     7. Pseudophakia  Z96.1     8. Primary open angle glaucoma (POAG) of right  eye, mild stage  H40.1111     9. Vitreous hemorrhage of right eye (HCC)  H43.11       1. CRVO w/ CME OU  - delayed f/u -- 5+ wks instead of 4  - pt of Dr. Ashley Royalty who wished to transfer care - OS long-standing low vision (CF) with subfoveal scar and central retinal atrophy - OD with CME that was actively being managed with Ozurdex OD ~q5 wks by Dr. Ashley Royalty (last on 8.20.20) s/p multiple IVTA and Ozurdex OD - was due for Ozurdex OD w/ Ashley Royalty on 9.25.20, but underwent cataract surgery OD w/ Alben Spittle on 9.21.20 - s/p IVTA OD #4 (10.26.20), #5 (12.10.20), #6 (01.21.21), #7 (03.05.21), #8 (04.16.21), #9 (05.28.21), #10 (07.09.21), #11 (09.14.21), #12 (10.29.21), #13 (12.17.21), #14 (01.27.22), #15 (03.11.2022), #16 (05.04.22), #17 (06.17.22), #18 (07.29.22), #19 (09.16.22), #20 (10.28.22), #21 (12.16.22), #22 (02.03.23) #23 (04.07.23), #24 (06.14.23) - s/p IVA OD #1 (07.17.23), #2 (08.14.23), #3 (09.14.23), #4 (10.18.23) #5 (12.01.24) - s/p IVE OD #1 (01.26.24), #2 (02.23.24), #3 (03.29.24), #4 (04.26.24), #5 (05.24.24)  - BCVA OD 20/150 from 20/100  - exam shows stable improvement in blood stained vit condensations - OCT OD: Interval increase in IRF/IRHM greatest IN mac; OS: Scattered IRF/cystic changes, persistent central ORA / SRHM at 5.5 wks -  long discussion with pt about importance of regular f/u and treatment for the prevention of further vision loss, even if we are seeing limited improvement in vision **discussed decreased efficacy / resistance to Eylea and potential benefit of switching medication**   - recommend switching to IVV OD #1 today, 07.03.24, w/ f/u in 4 wks  - pt wishes to proceed with injection  - RBA of procedure discussed, questions answered - see procedure note  - IVV informed consent obtained and signed, 07.03.24 (OD)  - IVTA informed consent obtained and re-signed on 06.14.23 - PACE approved Vabysmo OD  - f/u 4 weeks -- DFE/OCT/possible injection  2,3. Severe  Non-proliferative diabetic retinopathy OU.  - OCT shows diabetic macular edema, OD -- slightly increased  - s/p IVTA OD on 06.14.23 as above  - s/p IVA OD #1 on 07.17.23 as above for DME as well as CRVO  - IVV OD #1 today as above due to IVE resistance  4,5. Hypertensive retinopathy OU  - discussed importance of tight BP control  - continue to monitor  6. Mixed form age related cataract OS  - under the expert management of [facility]  7. Pseudophakia OD  - s/p CE/IOL OD (9.21.20) by expert surgeon, Dr. Alben Spittle  - IOL in excellent position  - had post op IOP spike -- IOP now under better control  - continue to monitor  8. POAG OD  - now under the expert management of Dr. Lottie Dawson (initial consult 07.27.23)  - s/p SLT and goniotomy OD w/ Dr. Alben Spittle  - had post-cataract IOP spike  - on Brimonidine TID OU and Cosopt BID OU  - IOP 15 today   Ophthalmic Meds Ordered this visit:  Meds ordered this encounter  Medications   faricimab-svoa (VABYSMO) 6mg /0.61mL intravitreal injection     Return in about 4 weeks (around 08/14/2022) for f/u CRVO OU, DFE, OCT.  There are no Patient Instructions on file for this visit.   This document serves as a record of services personally performed by Karie Chimera, MD, PhD. It was created on their behalf by Glee Arvin. Manson Passey, OA an ophthalmic technician. The creation of this record is the provider's dictation and/or activities during the visit.    Electronically signed by: Glee Arvin. Manson Passey, OA 07/17/22 11:53 AM  Karie Chimera, M.D., Ph.D. Diseases & Surgery of the Retina and Vitreous [facility] 07/17/2022   I have reviewed the above documentation for accuracy and completeness, and I agree with the above. Karie Chimera, M.D., Ph.D. 07/17/22 11:56 AM  Abbreviations: M myopia (nearsighted); A astigmatism; H hyperopia (farsighted); P presbyopia; Mrx spectacle prescription;  CTL contact lenses; OD right eye; OS left eye; OU  both eyes  XT exotropia; ET esotropia; PEK punctate epithelial keratitis; PEE punctate epithelial erosions; DES dry eye syndrome; MGD meibomian gland dysfunction; ATs artificial tears; PFAT's preservative free artificial tears; NSC nuclear sclerotic cataract; PSC posterior subcapsular cataract; ERM epi-retinal membrane; PVD posterior vitreous detachment; RD retinal detachment; DM diabetes mellitus; DR diabetic retinopathy; NPDR non-proliferative diabetic retinopathy; PDR proliferative diabetic retinopathy; CSME clinically significant macular edema; DME diabetic macular edema; dbh dot blot hemorrhages; CWS cotton wool spot; POAG primary open angle glaucoma; C/D cup-to-disc ratio; HVF humphrey visual field; GVF goldmann visual field; OCT optical coherence tomography; IOP intraocular pressure; BRVO Branch retinal vein occlusion; CRVO central retinal vein occlusion; CRAO central retinal artery occlusion; BRAO branch retinal artery occlusion; RT retinal tear; SB scleral buckle; PPV  pars plana vitrectomy; VH Vitreous hemorrhage; PRP panretinal laser photocoagulation; IVK intravitreal kenalog; VMT vitreomacular traction; MH Macular hole;  NVD neovascularization of the disc; NVE neovascularization elsewhere; AREDS age related eye disease study; ARMD age related macular degeneration; POAG primary open angle glaucoma; EBMD epithelial/anterior basement membrane dystrophy; ACIOL anterior chamber intraocular lens; IOL intraocular lens; PCIOL posterior chamber intraocular lens; Phaco/IOL phacoemulsification with intraocular lens placement; [place] photorefractive keratectomy; LASIK laser assisted in situ keratomileusis; HTN hypertension; DM diabetes mellitus; COPD chronic obstructive pulmonary disease

## 2022-08-01 ENCOUNTER — Encounter (INDEPENDENT_AMBULATORY_CARE_PROVIDER_SITE_OTHER): Payer: Medicare (Managed Care) | Admitting: Ophthalmology

## 2022-08-08 ENCOUNTER — Encounter: Payer: Self-pay | Admitting: Vascular Surgery

## 2022-08-08 ENCOUNTER — Ambulatory Visit (INDEPENDENT_AMBULATORY_CARE_PROVIDER_SITE_OTHER): Payer: Medicare (Managed Care) | Admitting: Vascular Surgery

## 2022-08-08 VITALS — BP 171/84 | HR 72 | Temp 97.7°F | Resp 20 | Ht 67.0 in | Wt 320.0 lb

## 2022-08-08 DIAGNOSIS — I890 Lymphedema, not elsewhere classified: Secondary | ICD-10-CM | POA: Diagnosis not present

## 2022-08-08 NOTE — Progress Notes (Signed)
REASON FOR VISIT:   Follow-up of lymphedema  MEDICAL ISSUES:   LYMPHEDEMA: This patient has significant chronic lymphedema.  We have previously discussed the importance of compression therapy, leg elevation, and use of her lymphedema pump.  Because of her size they are having a hard time helping her get her legs up on the leg rest and helping her put on her lymphedema pump.  However this is what she really needs and so I have written orders to encourage them to help her at the facility.  I encouraged him to elevate her legs for 2 to 4 hours a day and to use the lymphedema pump for 2 hours a day.  If there is no improvement the only other option would be to refer the patient to the lymphedema clinic in [place].  HPI:   Mary [name] is a pleasant 77 y.o. female who I last saw on 02/28/2022.  She has severe bilateral lower extremity lymphedema which is more significant on the left side.  She does have a lymphedema pump.  At the time I saw her last I encouraged her to use this for 2 hours a day and we also discussed the importance of proper leg elevation.  Fortunately at that time the wounds on her left great toe had healed.  She was to come back as needed.  She comes back today because her swelling has progressed.  Of note, her BMI at that time was 50.  Since I saw her last, she states that the swelling has not improved.  She tells me that they are having a hard time getting her on her leg rest.  She did obtain an extrawide leg rest but they have not putting her legs on this.  In addition she has a lymphedema pump but they have not been helping her get her pump on.  She knows what she needs to do she is just having a hard time getting it done.  She has no new wounds on her legs.  Her activity is very limited.  Past Medical History:  Diagnosis Date   Arthritis    osteoarthritis   Cataract    OS   Diabetes mellitus    Insulin dependent   Diabetic retinopathy (HCC)    NPDR OU    Glaucoma    POAG OD   H/O cardiovascular stress test    pt. reports 10/13- was normal per pt.    Hyperlipemia    Hypertension    Hypertensive retinopathy    OU    Family History  Problem Relation Age of Onset   Hypertension Mother    Heart Problems Father    Gout Father    Arthritis Father     SOCIAL HISTORY: Social History   Tobacco Use   Smoking status: Former    Current packs/day: 0.00    Average packs/day: 1 pack/day for 47.5 years (47.5 ttl pk-yrs)    Types: Cigarettes    Start date: 01/15/1967    Quit date: 06/28/2014    Years since quitting: 8.1   Smokeless tobacco: Never  Substance Use Topics   Alcohol use: No    No Known Allergies  Current Outpatient Medications  Medication Sig Dispense Refill   amLODipine (NORVASC) 5 MG tablet Take 1 tablet (5 mg total) by mouth daily. 30 tablet 0   aspirin 81 MG chewable tablet Chew 81 mg by mouth daily.     Assure Comfort Lancets 28G MISC  benzonatate (TESSALON) 200 MG capsule Take 1 capsule (200 mg total) by mouth 3 (three) times daily. 20 capsule 0   Blood Glucose Monitoring Suppl (ONETOUCH VERIO FLEX SYSTEM) w/Device KIT Use as directed to check blood sugars 2 times per day dx: e11.65 1 kit 1   brimonidine (ALPHAGAN) 0.2 % ophthalmic solution INSTILL 1 DROP INTO BOTH EYES TWICE A DAY (Patient taking differently: Place 1 drop into both eyes 3 (three) times daily.) 10 mL 5   cetirizine (ZYRTEC) 10 MG tablet Take 1 tablet (10 mg total) by mouth daily. 90 tablet 1   Cholecalciferol (VITAMIN D) 50 MCG (2000 UT) CAPS Take 2,000 Units by mouth daily at 6 (six) AM.     dorzolamide-timolol (COSOPT) 22.3-6.8 MG/ML ophthalmic solution Place 1 drop into the right eye 2 (two) times daily. (Patient taking differently: Place 1 drop into both eyes 2 (two) times daily.) 10 mL 6   Emollient (BAG BALM) OINT Apply 1 Application topically 4 (four) times daily as needed (itching).     fluticasone (FLONASE) 50 MCG/ACT nasal spray SPRAY 1  SPRAY INTO EACH NOSTRIL EVERY DAY (Patient taking differently: Place 1 spray into both nostrils daily.) 32 mL 1   furosemide (LASIX) 40 MG tablet TAKE 1 TABLET BY MOUTH  DAILY 90 tablet 3   glucose blood (ONETOUCH VERIO) test strip Use as directed to check blood sugars 2 times per day dx: e11.65 100 each 3   HYDROcodone-acetaminophen (NORCO) 10-325 MG tablet Take 1 tablet by mouth 5 (five) times daily as needed for moderate pain.     Ipratropium-Albuterol (COMBIVENT) 20-100 MCG/ACT AERS respimat Inhale 1 puff into the lungs 2 (two) times daily. 4 g 0   levothyroxine (SYNTHROID) 125 MCG tablet Take 125 mcg by mouth daily before breakfast.     loperamide (IMODIUM) 2 MG capsule Take 2 mg by mouth every 6 (six) hours as needed for diarrhea or loose stools.     metoprolol succinate (TOPROL-XL) 50 MG 24 hr tablet TAKE 1 TABLET BY MOUTH  DAILY (Patient taking differently: Take 50 mg by mouth daily.) 90 tablet 1   montelukast (SINGULAIR) 10 MG tablet Take 10 mg by mouth at bedtime.     nystatin cream (MYCOSTATIN) Apply 1 Application topically 2 (two) times daily as needed for dry skin.     omeprazole (PRILOSEC) 20 MG capsule TAKE 1 CAPSULE BY MOUTH  DAILY (Patient taking differently: Take 20 mg by mouth daily.) 90 capsule 3   OneTouch Delica Lancets 33G MISC Use as directed to check blood sugars 2 times per day dx: e11.65 100 each 3   pravastatin (PRAVACHOL) 40 MG tablet TAKE 1 TABLET BY MOUTH EVERY DAY (Patient taking differently: Take 80 mg by mouth daily.) 90 tablet 1   telmisartan (MICARDIS) 80 MG tablet TAKE 1 TABLET BY MOUTH  DAILY (Patient taking differently: Take 80 mg by mouth daily.) 90 tablet 3   timolol (BETIMOL) 0.25 % ophthalmic solution Place 1 drop into both eyes 2 (two) times daily.     triamcinolone (KENALOG) 0.1 % APPLY TO AFFECTED AREA TWICE A DAY (Patient taking differently: Apply 1 Application topically 2 (two) times daily as needed (rash).) 30 g 0   No current facility-administered  medications for this visit.    REVIEW OF SYSTEMS:  [X]  denotes positive finding, [ ]  denotes negative finding Cardiac  Comments:  Chest pain or chest pressure:    Shortness of breath upon exertion:    Short of breath when lying  flat:    Irregular heart rhythm:        Vascular    Pain in calf, thigh, or hip brought on by ambulation:    Pain in feet at night that wakes you up from your sleep:     Blood clot in your veins:    Leg swelling:  x       Pulmonary    Oxygen at home:    Productive cough:     Wheezing:         Neurologic    Sudden weakness in arms or legs:     Sudden numbness in arms or legs:     Sudden onset of difficulty speaking or slurred speech:    Temporary loss of vision in one eye:     Problems with dizziness:         Gastrointestinal    Blood in stool:     Vomited blood:         Genitourinary    Burning when urinating:     Blood in urine:        Psychiatric    Major depression:         Hematologic    Bleeding problems:    Problems with blood clotting too easily:        Skin    Rashes or ulcers:        Constitutional    Fever or chills:     PHYSICAL EXAM:   Vitals:   08/08/22 0939  BP: (!) 171/84  Pulse: 72  Resp: 20  Temp: 97.7 F (36.5 C)  SpO2: 95%  Weight: (!) 320 lb (145.2 kg)  Height: 5\' 7"  (1.702 m)   Body mass index is 50.12 kg/m.  GENERAL: The patient is a well-nourished female, in no acute distress. The vital signs are documented above. CARDIAC: There is a regular rate and rhythm.  VASCULAR: Both feet are warm and well-perfused. She has profound bilateral lower extremity swelling which is more significant on the left side.     PULMONARY: There is good air exchange bilaterally without wheezing or rales. ABDOMEN: Soft and non-tender with normal pitched bowel sounds.  MUSCULOSKELETAL: There are no major deformities or cyanosis. NEUROLOGIC: No focal weakness or paresthesias are detected. SKIN: There are no ulcers or  rashes noted. PSYCHIATRIC: The patient has a normal affect.  DATA:    No new data  [facility] [phone]

## 2022-08-16 ENCOUNTER — Encounter (INDEPENDENT_AMBULATORY_CARE_PROVIDER_SITE_OTHER): Payer: Medicare (Managed Care) | Admitting: Ophthalmology

## 2022-08-16 DIAGNOSIS — Z7985 Long-term (current) use of injectable non-insulin antidiabetic drugs: Secondary | ICD-10-CM

## 2022-08-16 DIAGNOSIS — H401111 Primary open-angle glaucoma, right eye, mild stage: Secondary | ICD-10-CM

## 2022-08-16 DIAGNOSIS — E113413 Type 2 diabetes mellitus with severe nonproliferative diabetic retinopathy with macular edema, bilateral: Secondary | ICD-10-CM

## 2022-08-16 DIAGNOSIS — H35033 Hypertensive retinopathy, bilateral: Secondary | ICD-10-CM

## 2022-08-16 DIAGNOSIS — Z961 Presence of intraocular lens: Secondary | ICD-10-CM

## 2022-08-16 DIAGNOSIS — H34813 Central retinal vein occlusion, bilateral, with macular edema: Secondary | ICD-10-CM

## 2022-08-16 DIAGNOSIS — H25812 Combined forms of age-related cataract, left eye: Secondary | ICD-10-CM

## 2022-08-16 DIAGNOSIS — I10 Essential (primary) hypertension: Secondary | ICD-10-CM

## 2022-08-29 ENCOUNTER — Encounter (INDEPENDENT_AMBULATORY_CARE_PROVIDER_SITE_OTHER): Payer: Medicare (Managed Care) | Admitting: Ophthalmology

## 2022-08-29 DIAGNOSIS — H35033 Hypertensive retinopathy, bilateral: Secondary | ICD-10-CM

## 2022-08-29 DIAGNOSIS — Z7985 Long-term (current) use of injectable non-insulin antidiabetic drugs: Secondary | ICD-10-CM

## 2022-08-29 DIAGNOSIS — E113413 Type 2 diabetes mellitus with severe nonproliferative diabetic retinopathy with macular edema, bilateral: Secondary | ICD-10-CM

## 2022-08-29 DIAGNOSIS — Z961 Presence of intraocular lens: Secondary | ICD-10-CM

## 2022-08-29 DIAGNOSIS — H34813 Central retinal vein occlusion, bilateral, with macular edema: Secondary | ICD-10-CM

## 2022-08-29 DIAGNOSIS — H4311 Vitreous hemorrhage, right eye: Secondary | ICD-10-CM

## 2022-08-29 DIAGNOSIS — H25812 Combined forms of age-related cataract, left eye: Secondary | ICD-10-CM

## 2022-08-29 DIAGNOSIS — I10 Essential (primary) hypertension: Secondary | ICD-10-CM

## 2022-08-29 DIAGNOSIS — H401111 Primary open-angle glaucoma, right eye, mild stage: Secondary | ICD-10-CM

## 2022-09-10 NOTE — Progress Notes (Signed)
[facility] - Clinic Note  09/13/2022     CHIEF COMPLAINT Patient presents for Retina Follow Up    HISTORY OF PRESENT ILLNESS: Mary [name] is a 77 y.o. female who presents to the clinic today for:   HPI     Retina Follow Up   Patient presents with  CRVO/BRVO.  In both eyes.  This started 4 weeks ago.  I, the attending physician,  performed the HPI with the patient and updated documentation appropriately.        Comments   Patient here for 4 weeks (8 weeks) for retina follow up for CRVO OU. Patient states vision terrible, about gone. Everything dark. No eye pain.       Last edited by Rennis Chris, MD on 09/13/2022  2:04 PM.    Patient is delayed to follow up from 8 weeks to 4 weeks due to transportation with [facility], pt feels like her vision is worse and darker   Referring physician: No referring provider defined for this encounter.  HISTORICAL INFORMATION:   Selected notes from the MEDICAL RECORD NUMBER Transferred care from Dr. Ashley Royalty   CURRENT MEDICATIONS: Current Outpatient Medications (Ophthalmic Drugs)  Medication Sig   brimonidine (ALPHAGAN) 0.2 % ophthalmic solution INSTILL 1 DROP INTO BOTH EYES TWICE A DAY (Patient taking differently: Place 1 drop into both eyes 3 (three) times daily.)   dorzolamide-timolol (COSOPT) 22.3-6.8 MG/ML ophthalmic solution Place 1 drop into the right eye 2 (two) times daily. (Patient taking differently: Place 1 drop into both eyes 2 (two) times daily.)   timolol (BETIMOL) 0.25 % ophthalmic solution Place 1 drop into both eyes 2 (two) times daily.   No current facility-administered medications for this visit. (Ophthalmic Drugs)   Current Outpatient Medications (Other)  Medication Sig   amLODipine (NORVASC) 5 MG tablet Take 1 tablet (5 mg total) by mouth daily.   aspirin 81 MG chewable tablet Chew 81 mg by mouth daily.   Assure Comfort Lancets 28G MISC    benzonatate (TESSALON) 200 MG capsule Take 1 capsule  (200 mg total) by mouth 3 (three) times daily.   Blood Glucose Monitoring Suppl (ONETOUCH VERIO FLEX SYSTEM) w/Device KIT Use as directed to check blood sugars 2 times per day dx: e11.65   cetirizine (ZYRTEC) 10 MG tablet Take 1 tablet (10 mg total) by mouth daily.   Cholecalciferol (VITAMIN D) 50 MCG (2000 UT) CAPS Take 2,000 Units by mouth daily at 6 (six) AM.   Emollient (BAG BALM) OINT Apply 1 Application topically 4 (four) times daily as needed (itching).   fluticasone (FLONASE) 50 MCG/ACT nasal spray SPRAY 1 SPRAY INTO EACH NOSTRIL EVERY DAY (Patient taking differently: Place 1 spray into both nostrils daily.)   furosemide (LASIX) 40 MG tablet TAKE 1 TABLET BY MOUTH  DAILY   glucose blood (ONETOUCH VERIO) test strip Use as directed to check blood sugars 2 times per day dx: e11.65   HYDROcodone-acetaminophen (NORCO) 10-325 MG tablet Take 1 tablet by mouth 5 (five) times daily as needed for moderate pain.   Ipratropium-Albuterol (COMBIVENT) 20-100 MCG/ACT AERS respimat Inhale 1 puff into the lungs 2 (two) times daily.   levothyroxine (SYNTHROID) 125 MCG tablet Take 125 mcg by mouth daily before breakfast.   loperamide (IMODIUM) 2 MG capsule Take 2 mg by mouth every 6 (six) hours as needed for diarrhea or loose stools.   metoprolol succinate (TOPROL-XL) 50 MG 24 hr tablet TAKE 1 TABLET BY MOUTH  DAILY (Patient  taking differently: Take 50 mg by mouth daily.)   montelukast (SINGULAIR) 10 MG tablet Take 10 mg by mouth at bedtime.   nystatin cream (MYCOSTATIN) Apply 1 Application topically 2 (two) times daily as needed for dry skin.   omeprazole (PRILOSEC) 20 MG capsule TAKE 1 CAPSULE BY MOUTH  DAILY (Patient taking differently: Take 20 mg by mouth daily.)   OneTouch Delica Lancets 33G MISC Use as directed to check blood sugars 2 times per day dx: e11.65   pravastatin (PRAVACHOL) 40 MG tablet TAKE 1 TABLET BY MOUTH EVERY DAY (Patient taking differently: Take 80 mg by mouth daily.)   telmisartan  (MICARDIS) 80 MG tablet TAKE 1 TABLET BY MOUTH  DAILY (Patient taking differently: Take 80 mg by mouth daily.)   triamcinolone (KENALOG) 0.1 % APPLY TO AFFECTED AREA TWICE A DAY (Patient taking differently: Apply 1 Application topically 2 (two) times daily as needed (rash).)   No current facility-administered medications for this visit. (Other)   REVIEW OF SYSTEMS: ROS   Positive for: Skin, Endocrine, Cardiovascular, Eyes Negative for: Constitutional, Gastrointestinal, Neurological, Genitourinary, Musculoskeletal, HENT, Respiratory, Psychiatric, Allergic/Imm, Heme/Lymph Last edited by Laddie Aquas, COA on 09/13/2022 12:42 PM.      ALLERGIES No Known Allergies  PAST MEDICAL HISTORY Past Medical History:  Diagnosis Date   Arthritis    osteoarthritis   Cataract    OS   Diabetes mellitus    Insulin dependent   Diabetic retinopathy (HCC)    NPDR OU   Glaucoma    POAG OD   H/O cardiovascular stress test    pt. reports 10/13- was normal per pt.    Hyperlipemia    Hypertension    Hypertensive retinopathy    OU   Past Surgical History:  Procedure Laterality Date   CARPAL TUNNEL RELEASE Bilateral    CATARACT EXTRACTION Right 10/05/2018   Dr. Alben Spittle   EYE SURGERY Right    Cat Sx   KNEE ARTHROSCOPY     right   QUADRICEPS TENDON REPAIR  02/17/2012   Procedure: REPAIR QUADRICEP TENDON;  Surgeon: Dannielle Huh, MD;  Location: [facility] OR;  Service: Orthopedics;  Laterality: Right;  right quadriceps repair with latera release   QUADRICEPS TENDON REPAIR Right 05/15/2012   Procedure: REPAIR QUADRICEP TENDON;  Surgeon: Dannielle Huh, MD;  Location: [facility] OR;  Service: Orthopedics;  Laterality: Right;   TOTAL KNEE ARTHROPLASTY  10/21/2011   Procedure: TOTAL KNEE ARTHROPLASTY;  Surgeon: Raymon Mutton, MD;  Location: [facility] OR;  Service: Orthopedics;  Laterality: Right;   TUBAL LIGATION     FAMILY HISTORY Family History  Problem Relation Age of Onset   Hypertension Mother    Heart Problems  Father    Gout Father    Arthritis Father    SOCIAL HISTORY Social History   Tobacco Use   Smoking status: Former    Current packs/day: 0.00    Average packs/day: 1 pack/day for 47.5 years (47.5 ttl pk-yrs)    Types: Cigarettes    Start date: 01/15/1967    Quit date: 06/28/2014    Years since quitting: 8.2   Smokeless tobacco: Never  Vaping Use   Vaping status: Never Used  Substance Use Topics   Alcohol use: No   Drug use: Yes    Types: Hydrocodone       OPHTHALMIC EXAM: Base Eye Exam     Visual Acuity (Snellen - Linear)       Right Left   Dist cc 20/150 HM  Dist ph cc NI NI    Correction: Glasses         Tonometry (Tonopen, 12:38 PM)       Right Left   Pressure 13 12         Pupils       Dark Light Shape React APD   Right 2 1 Round Sluggish None   Left 2 2 Round NR None         Visual Fields (Counting fingers)       Left Right     Full   Restrictions Total superior temporal, inferior temporal, superior nasal, inferior nasal deficiencies          Extraocular Movement       Right Left    Full, Ortho LXT, Ortho    -- -- --  --  --  -- -- --   -- -- --  --  --  -- -- --           Neuro/Psych     Oriented x3: Yes   Mood/Affect: Normal         Dilation     Both eyes: 1.0% Mydriacyl, 2.5% Phenylephrine @ 12:38 PM           Slit Lamp and Fundus Exam     Slit Lamp Exam       Right Left   Lids/Lashes Dermatochalasis - upper lid, mild Meibomian gland dysfunction Dermatochalasis - upper lid, mild Meibomian gland dysfunction   Conjunctiva/Sclera Melanosis Melanosis   Cornea Trace, Punctate epithelial erosions, well healed temporal cataract wounds, arcus, trace EBMD Mild arcus, trace PEE   Anterior Chamber Deep and quiet Deep and quiet   Iris Round and poorly dilated to 3.44mm Round and moderately dilated to 6mm   Lens PC IOL in good position, trace, cortical remnant temporally, PC folds 3+ Nuclear sclerosis with brunescense,  3+ Cortical cataract   Anterior Vitreous Vitreous syneresis, Ozurdex pellet remnants inferiorly, mild blood stained vitreous condensations -- now white -- slightly improved Vitreous syneresis         Fundus Exam       Right Left   Disc 2-3+Pallor, Sharp rim, temporal PPP/PPA, thin superior rim, +cupping, vascular loops, no heme Mild Pallor, Sharp rim, PPP, severely attenuated vessels   C/D Ratio 0.9 0.3   Macula Blunted foveal reflex, Drusen, Retinal pigment epithelial mottling, punctate exudates - improved, scattered DBH greatest inferior macula -- improved, interval improvement in IN edema Flat, Blunted foveal reflex, RPE mottling and clumping, scarring, atrophy, no heme   Vessels attenuated, Tortuous Severe Vascular attenuation, +fibrosis   Periphery Attached, 360 punctate IRH Attached, pavingstone and reticular degeneration inferiorly, scattered RPE atrophy, greatest peripapillary           Refraction     Wearing Rx       Sphere Cylinder Axis Add   Right -1.00 +0.75 180 +2.25   Left -0.50 +0.75 155 +2.25           IMAGING AND PROCEDURES  Imaging and Procedures for @TODAY @  OCT, Retina - OU - Both Eyes       Right Eye Quality was good. Central Foveal Thickness: 324. Progression has improved. Findings include no SRF, abnormal foveal contour, intraretinal hyper-reflective material, intraretinal fluid, vitreomacular adhesion (Interval improvement in IRF/IRHM greatest inferior mac and fovea).   Left Eye Quality was borderline. Central Foveal Thickness: 241. Progression has improved. Findings include no SRF, abnormal foveal contour, subretinal hyper-reflective material, epiretinal membrane, intraretinal  fluid, pigment epithelial detachment, outer retinal atrophy, preretinal fibrosis (Mild interval improvement in scattered cystic changes, persistent central ORA / SRHM).   Notes *Images captured and stored on drive  Diagnosis / Impression:  OD: Interval improvement in  IRF/IRHM greatest inferior mac and fovea OS: Mild interval improvement in scattered cystic changes, persistent central ORA / SRHM  Clinical management:  See below  Abbreviations: NFP - Normal foveal profile. CME - cystoid macular edema. PED - pigment epithelial detachment. IRF - intraretinal fluid. SRF - subretinal fluid. EZ - ellipsoid zone. ERM - epiretinal membrane. ORA - outer retinal atrophy. ORT - outer retinal tubulation. SRHM - subretinal hyper-reflective material      Intravitreal Injection, Pharmacologic Agent - OD - Right Eye       Time Out 09/13/2022. 1:04 PM. Confirmed correct patient, procedure, site, and patient consented.   Anesthesia Topical anesthesia was used. Anesthetic medications included Lidocaine 2%, Proparacaine 0.5%.   Procedure Preparation included 5% betadine to ocular surface, eyelid speculum. A (32g) needle was used.   Injection: 6 mg faricimab-svoa 6 MG/0.05ML   Route: Intravitreal, Site: Right Eye   NDC: [phone], Lot: [id number], Expiration date: 09/13/2024, Waste: 0 mL   Post-op Post injection exam found visual acuity of at least counting fingers, no retinal detachment, perfused optic nerve. The patient tolerated the procedure well. There were no complications. The patient received written and verbal post procedure care education.            ASSESSMENT/PLAN:    ICD-10-CM   1. Central retinal vein occlusion with macular edema of both eyes  H34.8130 OCT, Retina - OU - Both Eyes    Intravitreal Injection, Pharmacologic Agent - OD - Right Eye    faricimab-svoa (VABYSMO) 6mg /0.47mL intravitreal injection    2. Severe nonproliferative diabetic retinopathy of both eyes with macular edema associated with type 2 diabetes mellitus (HCC)  U44.0347     3. Long-term (current) use of injectable non-insulin antidiabetic drugs  Z79.85     4. Essential hypertension  I10     5. Hypertensive retinopathy of both eyes  H35.033     6. Combined forms of  age-related cataract of left eye  H25.812     7. Pseudophakia  Z96.1     8. Primary open angle glaucoma (POAG) of right eye, mild stage  H40.1111      1. CRVO w/ CME OU  - delayed f/u -- 8+ wks instead of 4  - pt of Dr. Ashley Royalty who wished to transfer care - OS long-standing low vision (CF) with subfoveal scar and central retinal atrophy - OD with CME that was actively being managed with Ozurdex OD ~q5 wks by Dr. Ashley Royalty (last on 8.20.20) s/p multiple IVTA and Ozurdex OD - was due for Ozurdex OD w/ Ashley Royalty on 9.25.20, but underwent cataract surgery OD w/ Alben Spittle on 9.21.20 - s/p IVTA OD #4 (10.26.20), #5 (12.10.20), #6 (01.21.21), #7 (03.05.21), #8 (04.16.21), #9 (05.28.21), #10 (07.09.21), #11 (09.14.21), #12 (10.29.21), #13 (12.17.21), #14 (01.27.22), #15 (03.11.2022), #16 (05.04.22), #17 (06.17.22), #18 (07.29.22), #19 (09.16.22), #20 (10.28.22), #21 (12.16.22), #22 (02.03.23) #23 (04.07.23), #24 (06.14.23) - s/p IVA OD #1 (07.17.23), #2 (08.14.23), #3 (09.14.23), #4 (10.18.23) #5 (12.01.24) - s/p IVE OD #1 (01.26.24), #2 (02.23.24), #3 (03.29.24), #4 (04.26.24), #5 (05.24.24) - s/p IVV OD #1 (07.03.24)  - BCVA OD stable at 20/150   - exam shows stable improvement in blood stained vit condensations - OCT shows OD: Interval improvement in IRF/IRHM greatest inferior  mac and fovea OS: Mild interval improvement in scattered cystic changes, persistent central ORA / SRHM at 8 weeks - long discussion with pt about importance of regular f/u and treatment for the prevention of further vision loss, even if we are seeing limited improvement in vision  - recommend IVV OD #2 today, 08.30.24, w/ f/u in 4 wks  - pt wishes to proceed with injection  - RBA of procedure discussed, questions answered - see procedure note  - IVV informed consent obtained and signed, 07.03.24 (OD) - [facility] approved Vabysmo OD  - f/u 4 weeks -- DFE/OCT/possible injection  2,3. Severe Non-proliferative diabetic retinopathy  OU.  - OCT shows diabetic macular edema, OD -- slightly increased  - s/p IVTA OD on 06.14.23 as above  - s/p IVA OD #1 on 07.17.23 as above for DME as well as CRVO  - IVV OD #2 today as above due to IVE resistance  4,5. Hypertensive retinopathy OU  - discussed importance of tight BP control  - continue to monitor  6. Mixed form age related cataract OS  - under the expert management of [facility]  7. Pseudophakia OD  - s/p CE/IOL OD (9.21.20) by expert surgeon, Dr. Alben Spittle  - IOL in excellent position  - had post op IOP spike -- IOP now under better control  - continue to monitor  8. POAG OD  - now under the expert management of Dr. Lottie Dawson (initial consult 07.27.23)  - s/p SLT and goniotomy OD w/ Dr. Alben Spittle  - had post-cataract IOP spike  - on Brimonidine TID OU and timolol BID OU -- no longer using Cosopt  - IOP 13 today  Return in about 4 weeks (around 10/11/2022) for f/u CRVO OU, DFE, OCT.   Ophthalmic Meds Ordered this visit:  Meds ordered this encounter  Medications   faricimab-svoa (VABYSMO) 6mg /0.21mL intravitreal injection     This document serves as a record of services personally performed by Karie Chimera, MD, PhD. It was created on their behalf by Glee Arvin. Manson Passey, OA an ophthalmic technician. The creation of this record is the provider's dictation and/or activities during the visit.    Electronically signed by: Glee Arvin. Manson Passey, OA 09/13/22 2:05 PM  Karie Chimera, M.D., Ph.D. Diseases & Surgery of the Retina and Vitreous [facility] 09/13/2022   I have reviewed the above documentation for accuracy and completeness, and I agree with the above. Karie Chimera, M.D., Ph.D. 09/13/22 2:06 PM    Abbreviations: M myopia (nearsighted); A astigmatism; H hyperopia (farsighted); P presbyopia; Mrx spectacle prescription;  CTL contact lenses; OD right eye; OS left eye; OU both eyes  XT exotropia; ET esotropia; PEK punctate epithelial  keratitis; PEE punctate epithelial erosions; DES dry eye syndrome; MGD meibomian gland dysfunction; ATs artificial tears; PFAT's preservative free artificial tears; NSC nuclear sclerotic cataract; PSC posterior subcapsular cataract; ERM epi-retinal membrane; PVD posterior vitreous detachment; RD retinal detachment; DM diabetes mellitus; DR diabetic retinopathy; NPDR non-proliferative diabetic retinopathy; PDR proliferative diabetic retinopathy; CSME clinically significant macular edema; DME diabetic macular edema; dbh dot blot hemorrhages; CWS cotton wool spot; POAG primary open angle glaucoma; C/D cup-to-disc ratio; HVF humphrey visual field; GVF goldmann visual field; OCT optical coherence tomography; IOP intraocular pressure; BRVO Branch retinal vein occlusion; CRVO central retinal vein occlusion; CRAO central retinal artery occlusion; BRAO branch retinal artery occlusion; RT retinal tear; SB scleral buckle; PPV pars plana vitrectomy; VH Vitreous hemorrhage; PRP panretinal laser photocoagulation; IVK  intravitreal kenalog; VMT vitreomacular traction; MH Macular hole;  NVD neovascularization of the disc; NVE neovascularization elsewhere; AREDS age related eye disease study; ARMD age related macular degeneration; POAG primary open angle glaucoma; EBMD epithelial/anterior basement membrane dystrophy; ACIOL anterior chamber intraocular lens; IOL intraocular lens; PCIOL posterior chamber intraocular lens; Phaco/IOL phacoemulsification with intraocular lens placement; PRK photorefractive keratectomy; LASIK laser assisted in situ keratomileusis; HTN hypertension; DM diabetes mellitus; COPD chronic obstructive pulmonary disease

## 2022-09-13 ENCOUNTER — Ambulatory Visit (INDEPENDENT_AMBULATORY_CARE_PROVIDER_SITE_OTHER): Payer: Medicare (Managed Care) | Admitting: Ophthalmology

## 2022-09-13 ENCOUNTER — Encounter (INDEPENDENT_AMBULATORY_CARE_PROVIDER_SITE_OTHER): Payer: Self-pay | Admitting: Ophthalmology

## 2022-09-13 DIAGNOSIS — Z7985 Long-term (current) use of injectable non-insulin antidiabetic drugs: Secondary | ICD-10-CM

## 2022-09-13 DIAGNOSIS — H34813 Central retinal vein occlusion, bilateral, with macular edema: Secondary | ICD-10-CM

## 2022-09-13 DIAGNOSIS — E113413 Type 2 diabetes mellitus with severe nonproliferative diabetic retinopathy with macular edema, bilateral: Secondary | ICD-10-CM

## 2022-09-13 DIAGNOSIS — H35033 Hypertensive retinopathy, bilateral: Secondary | ICD-10-CM

## 2022-09-13 DIAGNOSIS — I10 Essential (primary) hypertension: Secondary | ICD-10-CM | POA: Diagnosis not present

## 2022-09-13 DIAGNOSIS — H401111 Primary open-angle glaucoma, right eye, mild stage: Secondary | ICD-10-CM

## 2022-09-13 DIAGNOSIS — H25812 Combined forms of age-related cataract, left eye: Secondary | ICD-10-CM

## 2022-09-13 DIAGNOSIS — Z961 Presence of intraocular lens: Secondary | ICD-10-CM

## 2022-09-13 MED ORDER — FARICIMAB-SVOA 6 MG/0.05ML IZ SOLN
6.0000 mg | INTRAVITREAL | Status: AC | PRN
  Administered 2022-09-13: 6 mg via INTRAVITREAL

## 2022-10-11 ENCOUNTER — Encounter (INDEPENDENT_AMBULATORY_CARE_PROVIDER_SITE_OTHER): Payer: Medicare (Managed Care) | Admitting: Ophthalmology

## 2022-10-11 DIAGNOSIS — Z961 Presence of intraocular lens: Secondary | ICD-10-CM

## 2022-10-11 DIAGNOSIS — H401111 Primary open-angle glaucoma, right eye, mild stage: Secondary | ICD-10-CM

## 2022-10-11 DIAGNOSIS — H25812 Combined forms of age-related cataract, left eye: Secondary | ICD-10-CM

## 2022-10-11 DIAGNOSIS — H34813 Central retinal vein occlusion, bilateral, with macular edema: Secondary | ICD-10-CM

## 2022-10-11 DIAGNOSIS — E113413 Type 2 diabetes mellitus with severe nonproliferative diabetic retinopathy with macular edema, bilateral: Secondary | ICD-10-CM

## 2022-10-11 DIAGNOSIS — Z7985 Long-term (current) use of injectable non-insulin antidiabetic drugs: Secondary | ICD-10-CM

## 2022-10-11 DIAGNOSIS — H35033 Hypertensive retinopathy, bilateral: Secondary | ICD-10-CM

## 2022-10-11 DIAGNOSIS — I10 Essential (primary) hypertension: Secondary | ICD-10-CM

## 2022-10-15 ENCOUNTER — Encounter (INDEPENDENT_AMBULATORY_CARE_PROVIDER_SITE_OTHER): Payer: Medicare (Managed Care) | Admitting: Ophthalmology

## 2022-10-15 DIAGNOSIS — I10 Essential (primary) hypertension: Secondary | ICD-10-CM

## 2022-10-15 DIAGNOSIS — Z961 Presence of intraocular lens: Secondary | ICD-10-CM

## 2022-10-15 DIAGNOSIS — Z7985 Long-term (current) use of injectable non-insulin antidiabetic drugs: Secondary | ICD-10-CM

## 2022-10-15 DIAGNOSIS — E113413 Type 2 diabetes mellitus with severe nonproliferative diabetic retinopathy with macular edema, bilateral: Secondary | ICD-10-CM

## 2022-10-15 DIAGNOSIS — H35033 Hypertensive retinopathy, bilateral: Secondary | ICD-10-CM

## 2022-10-15 DIAGNOSIS — H401111 Primary open-angle glaucoma, right eye, mild stage: Secondary | ICD-10-CM

## 2022-10-15 DIAGNOSIS — H34813 Central retinal vein occlusion, bilateral, with macular edema: Secondary | ICD-10-CM

## 2022-10-15 DIAGNOSIS — H25812 Combined forms of age-related cataract, left eye: Secondary | ICD-10-CM

## 2022-10-17 NOTE — Progress Notes (Signed)
[facility] - Clinic Note  10/29/2022     CHIEF COMPLAINT Patient presents for Retina Follow Up    HISTORY OF PRESENT ILLNESS: Mary [name] is a 77 y.o. female who presents to the clinic today for:   HPI     Retina Follow Up   Patient presents with  CRVO/BRVO.  In right eye.  This started 4 weeks ago.  Duration of 4 weeks.  Since onset it is stable.        Comments   4 week CRVO OD and IVV OD pt is reporting vision is not as sharp she denies any flashes or floaters she is not checking her blood sugar at this time dorz/tim bid OD brim tid ou       Last edited by Etheleen Mayhew, COT on 10/29/2022  2:17 PM.       Referring physician: No referring provider defined for this encounter.  HISTORICAL INFORMATION:   Selected notes from the MEDICAL RECORD NUMBER Transferred care from Dr. Ashley Royalty   CURRENT MEDICATIONS: Current Outpatient Medications (Ophthalmic Drugs)  Medication Sig   brimonidine (ALPHAGAN) 0.2 % ophthalmic solution INSTILL 1 DROP INTO BOTH EYES TWICE A DAY (Patient taking differently: Place 1 drop into both eyes 3 (three) times daily.)   dorzolamide-timolol (COSOPT) 22.3-6.8 MG/ML ophthalmic solution Place 1 drop into the right eye 2 (two) times daily. (Patient taking differently: Place 1 drop into both eyes 2 (two) times daily.)   timolol (BETIMOL) 0.25 % ophthalmic solution Place 1 drop into both eyes 2 (two) times daily.   No current facility-administered medications for this visit. (Ophthalmic Drugs)   Current Outpatient Medications (Other)  Medication Sig   amLODipine (NORVASC) 5 MG tablet Take 1 tablet (5 mg total) by mouth daily.   aspirin 81 MG chewable tablet Chew 81 mg by mouth daily.   Assure Comfort Lancets 28G MISC    benzonatate (TESSALON) 200 MG capsule Take 1 capsule (200 mg total) by mouth 3 (three) times daily.   Blood Glucose Monitoring Suppl (ONETOUCH VERIO FLEX SYSTEM) w/Device KIT Use as directed to  check blood sugars 2 times per day dx: e11.65   cetirizine (ZYRTEC) 10 MG tablet Take 1 tablet (10 mg total) by mouth daily.   Cholecalciferol (VITAMIN D) 50 MCG (2000 UT) CAPS Take 2,000 Units by mouth daily at 6 (six) AM.   Emollient (BAG BALM) OINT Apply 1 Application topically 4 (four) times daily as needed (itching).   fluticasone (FLONASE) 50 MCG/ACT nasal spray SPRAY 1 SPRAY INTO EACH NOSTRIL EVERY DAY (Patient taking differently: Place 1 spray into both nostrils daily.)   furosemide (LASIX) 40 MG tablet TAKE 1 TABLET BY MOUTH  DAILY   glucose blood (ONETOUCH VERIO) test strip Use as directed to check blood sugars 2 times per day dx: e11.65   HYDROcodone-acetaminophen (NORCO) 10-325 MG tablet Take 1 tablet by mouth 5 (five) times daily as needed for moderate pain.   Ipratropium-Albuterol (COMBIVENT) 20-100 MCG/ACT AERS respimat Inhale 1 puff into the lungs 2 (two) times daily.   levothyroxine (SYNTHROID) 125 MCG tablet Take 125 mcg by mouth daily before breakfast.   loperamide (IMODIUM) 2 MG capsule Take 2 mg by mouth every 6 (six) hours as needed for diarrhea or loose stools.   metoprolol succinate (TOPROL-XL) 50 MG 24 hr tablet TAKE 1 TABLET BY MOUTH  DAILY (Patient taking differently: Take 50 mg by mouth daily.)   montelukast (SINGULAIR) 10 MG tablet Take 10  mg by mouth at bedtime.   nystatin cream (MYCOSTATIN) Apply 1 Application topically 2 (two) times daily as needed for dry skin.   omeprazole (PRILOSEC) 20 MG capsule TAKE 1 CAPSULE BY MOUTH  DAILY (Patient taking differently: Take 20 mg by mouth daily.)   OneTouch Delica Lancets 33G MISC Use as directed to check blood sugars 2 times per day dx: e11.65   pravastatin (PRAVACHOL) 40 MG tablet TAKE 1 TABLET BY MOUTH EVERY DAY (Patient taking differently: Take 80 mg by mouth daily.)   telmisartan (MICARDIS) 80 MG tablet TAKE 1 TABLET BY MOUTH  DAILY (Patient taking differently: Take 80 mg by mouth daily.)   triamcinolone (KENALOG) 0.1 %  APPLY TO AFFECTED AREA TWICE A DAY (Patient taking differently: Apply 1 Application topically 2 (two) times daily as needed (rash).)   No current facility-administered medications for this visit. (Other)   REVIEW OF SYSTEMS: ROS   Positive for: Skin, Endocrine, Cardiovascular, Eyes Negative for: Constitutional, Gastrointestinal, Neurological, Genitourinary, Musculoskeletal, HENT, Respiratory, Psychiatric, Allergic/Imm, Heme/Lymph Last edited by Etheleen Mayhew, COT on 10/29/2022  2:10 PM.       ALLERGIES No Known Allergies  PAST MEDICAL HISTORY Past Medical History:  Diagnosis Date   Arthritis    osteoarthritis   Cataract    OS   Diabetes mellitus    Insulin dependent   Diabetic retinopathy (HCC)    NPDR OU   Glaucoma    POAG OD   H/O cardiovascular stress test    pt. reports 10/13- was normal per pt.    Hyperlipemia    Hypertension    Hypertensive retinopathy    OU   Past Surgical History:  Procedure Laterality Date   CARPAL TUNNEL RELEASE Bilateral    CATARACT EXTRACTION Right 10/05/2018   Dr. Alben Spittle   EYE SURGERY Right    Cat Sx   KNEE ARTHROSCOPY     right   QUADRICEPS TENDON REPAIR  02/17/2012   Procedure: REPAIR QUADRICEP TENDON;  Surgeon: Dannielle Huh, MD;  Location: [facility] OR;  Service: Orthopedics;  Laterality: Right;  right quadriceps repair with latera release   QUADRICEPS TENDON REPAIR Right 05/15/2012   Procedure: REPAIR QUADRICEP TENDON;  Surgeon: Dannielle Huh, MD;  Location: [facility] OR;  Service: Orthopedics;  Laterality: Right;   TOTAL KNEE ARTHROPLASTY  10/21/2011   Procedure: TOTAL KNEE ARTHROPLASTY;  Surgeon: Raymon Mutton, MD;  Location: [facility] OR;  Service: Orthopedics;  Laterality: Right;   TUBAL LIGATION     FAMILY HISTORY Family History  Problem Relation Age of Onset   Hypertension Mother    Heart Problems Father    Gout Father    Arthritis Father    SOCIAL HISTORY Social History   Tobacco Use   Smoking status: Former    Current  packs/day: 0.00    Average packs/day: 1 pack/day for 47.5 years (47.5 ttl pk-yrs)    Types: Cigarettes    Start date: 01/15/1967    Quit date: 06/28/2014    Years since quitting: 8.3   Smokeless tobacco: Never  Vaping Use   Vaping status: Never Used  Substance Use Topics   Alcohol use: No   Drug use: Yes    Types: Hydrocodone       OPHTHALMIC EXAM: Base Eye Exam     Visual Acuity (Snellen - Linear)       Right Left   Dist [place] 20/200 -1 HM   Dist ph [place] NI NI  Tonometry (Tonopen, 2:16 PM)       Right Left   Pressure 20 19         Pupils       Pupils Dark Light Shape React APD   Right PERRL 2 1 Round Sluggish None   Left PERRL 2 2 Round NR None         Visual Fields       Left Right   Restrictions Total superior temporal, inferior temporal, superior nasal, inferior nasal deficiencies          Extraocular Movement       Right Left    Full, Ortho Full, Ortho         Neuro/Psych     Oriented x3: Yes   Mood/Affect: Normal         Dilation     Both eyes: 2.5% Phenylephrine @ 2:16 PM           Slit Lamp and Fundus Exam     Slit Lamp Exam       Right Left   Lids/Lashes Dermatochalasis - upper lid, mild Meibomian gland dysfunction Dermatochalasis - upper lid, mild Meibomian gland dysfunction   Conjunctiva/Sclera Melanosis Melanosis   Cornea Trace, Punctate epithelial erosions, well healed temporal cataract wounds, arcus, trace EBMD Mild arcus, trace PEE   Anterior Chamber Deep and quiet Deep and quiet   Iris Round and poorly dilated to 3.39mm Round and moderately dilated to 6mm   Lens PC IOL in good position, trace, cortical remnant temporally, PC folds 3+ Nuclear sclerosis with brunescense, 3+ Cortical cataract   Anterior Vitreous Vitreous syneresis, Ozurdex pellet remnants inferiorly, mild blood stained vitreous condensations -- now white -- slightly improved Vitreous syneresis         Fundus Exam       Right Left   Disc  2-3+Pallor, Sharp rim, temporal PPP/PPA, thin superior rim, +cupping, vascular loops, no heme Mild Pallor, Sharp rim, PPP, severely attenuated vessels   C/D Ratio 0.9 0.3   Macula Blunted foveal reflex, Drusen, Retinal pigment epithelial mottling, punctate exudates - improved, scattered DBH greatest inferior macula -- improved, interval improvement in IN edema Flat, Blunted foveal reflex, RPE mottling and clumping, scarring, atrophy, no heme   Vessels attenuated, Tortuous Severe Vascular attenuation, +fibrosis   Periphery Attached, 360 punctate IRH Attached, pavingstone and reticular degeneration inferiorly, scattered RPE atrophy, greatest peripapillary           Refraction     Wearing Rx       Sphere Cylinder Axis Add   Right -1.00 +0.75 180 +2.25   Left -0.50 +0.75 155 +2.25           IMAGING AND PROCEDURES  Imaging and Procedures for @TODAY @          ASSESSMENT/PLAN:    ICD-10-CM   1. Central retinal vein occlusion with macular edema of both eyes  H34.8130 OCT, Retina - OU - Both Eyes    2. Severe nonproliferative diabetic retinopathy of both eyes with macular edema associated with type 2 diabetes mellitus (HCC)  W09.8119     3. Long-term (current) use of injectable non-insulin antidiabetic drugs  Z79.85     4. Essential hypertension  I10     5. Hypertensive retinopathy of both eyes  H35.033     6. Combined forms of age-related cataract of left eye  H25.812     7. Pseudophakia  Z96.1     8. Primary open angle glaucoma (POAG) of  right eye, mild stage  H40.1111       1. CRVO w/ CME OU  - delayed f/u -- 8+ wks instead of 4  - pt of Dr. Ashley Royalty who wished to transfer care - OS long-standing low vision (CF) with subfoveal scar and central retinal atrophy - OD with CME that was actively being managed with Ozurdex OD ~q5 wks by Dr. Ashley Royalty (last on 8.20.20) s/p multiple IVTA and Ozurdex OD - was due for Ozurdex OD w/ Ashley Royalty on 9.25.20, but underwent cataract  surgery OD w/ Alben Spittle on 9.21.20 - s/p IVTA OD #4 (10.26.20), #5 (12.10.20), #6 (01.21.21), #7 (03.05.21), #8 (04.16.21), #9 (05.28.21), #10 (07.09.21), #11 (09.14.21), #12 (10.29.21), #13 (12.17.21), #14 (01.27.22), #15 (03.11.2022), #16 (05.04.22), #17 (06.17.22), #18 (07.29.22), #19 (09.16.22), #20 (10.28.22), #21 (12.16.22), #22 (02.03.23) #23 (04.07.23), #24 (06.14.23) - s/p IVA OD #1 (07.17.23), #2 (08.14.23), #3 (09.14.23), #4 (10.18.23) #5 (12.01.24) - s/p IVE OD #1 (01.26.24), #2 (02.23.24), #3 (03.29.24), #4 (04.26.24), #5 (05.24.24) - s/p IVV OD #1 (07.03.24), #2 (08.30.24)  - BCVA OD 20/200 - decrease  - exam shows stable improvement in blood stained vit condensations - OCT shows OD: Mild Interval improvement in IRF/IRHM greatest inferior mac and fovea OS: Mild interval improvement in scattered cystic changes, persistent central ORA / SRHM at 8 weeks - long discussion with pt about importance of regular f/u and treatment for the prevention of further vision loss, even if we are seeing limited improvement in vision  - recommend IVV OD #3 today, 10.15.24, w/ f/u in 4 wks  - pt wishes to proceed with injection  - RBA of procedure discussed, questions answered - see procedure note  - IVV informed consent obtained and signed, 07.03.24 (OD) - PACE approved Vabysmo OD  - f/u 4 weeks -- DFE/OCT/possible injection  2,3. Severe Non-proliferative diabetic retinopathy OU.  - OCT shows diabetic macular edema, OD -- slightly increased  - s/p IVTA OD on 06.14.23 as above  - s/p IVA OD #1 on 07.17.23 as above for DME as well as CRVO  - IVV OD #2 today as above due to IVE resistance  4,5. Hypertensive retinopathy OU  - discussed importance of tight BP control  - continue to monitor  6. Mixed form age related cataract OS  - under the expert management of [facility]  7. Pseudophakia OD  - s/p CE/IOL OD (9.21.20) by expert surgeon, Dr. Alben Spittle  - IOL in excellent position  - had post op  IOP spike -- IOP now under better control  - continue to monitor  8. POAG OD  - now under the expert management of Dr. Lottie Dawson (initial consult 07.27.23)  - s/p SLT and goniotomy OD w/ Dr. Alben Spittle  - had post-cataract IOP spike  - on Brimonidine TID OU and Timolol BID OU -- no longer using Cosopt  - IOP 20 today  No follow-ups on file.   Ophthalmic Meds Ordered this visit:  No orders of the defined types were placed in this encounter.    This document serves as a record of services personally performed by Karie Chimera, MD, PhD. It was created on their behalf by Charlette Caffey, COT an ophthalmic technician. The creation of this record is the provider's dictation and/or activities during the visit.    Electronically signed by:  Charlette Caffey, COT  10/29/22 2:38 PM  Karie Chimera, M.D., Ph.D. Diseases & Surgery of the Retina and Vitreous [facility]    Abbreviations: M myopia (nearsighted); A astigmatism; H hyperopia (farsighted); P presbyopia; Mrx spectacle prescription;  CTL contact lenses; OD right eye; OS left eye; OU both eyes  XT exotropia; ET esotropia; PEK punctate epithelial keratitis; PEE punctate epithelial erosions; DES dry eye syndrome; MGD meibomian gland dysfunction; ATs artificial tears; PFAT's preservative free artificial tears; NSC nuclear sclerotic cataract; PSC posterior subcapsular cataract; ERM epi-retinal membrane; PVD posterior vitreous detachment; RD retinal detachment; DM diabetes mellitus; DR diabetic retinopathy; NPDR non-proliferative diabetic retinopathy; PDR proliferative diabetic retinopathy; CSME clinically significant macular edema; DME diabetic macular edema; dbh dot blot hemorrhages; CWS cotton wool spot; POAG primary open angle glaucoma; C/D cup-to-disc ratio; HVF humphrey visual field; GVF goldmann visual field; OCT optical coherence tomography; IOP intraocular pressure; BRVO Branch retinal vein occlusion; CRVO central  retinal vein occlusion; CRAO central retinal artery occlusion; BRAO branch retinal artery occlusion; RT retinal tear; SB scleral buckle; PPV pars plana vitrectomy; VH Vitreous hemorrhage; PRP panretinal laser photocoagulation; IVK intravitreal kenalog; VMT vitreomacular traction; MH Macular hole;  NVD neovascularization of the disc; NVE neovascularization elsewhere; AREDS age related eye disease study; ARMD age related macular degeneration; POAG primary open angle glaucoma; EBMD epithelial/anterior basement membrane dystrophy; ACIOL anterior chamber intraocular lens; IOL intraocular lens; PCIOL posterior chamber intraocular lens; Phaco/IOL phacoemulsification with intraocular lens placement; PRK photorefractive keratectomy; LASIK laser assisted in situ keratomileusis; HTN hypertension; DM diabetes mellitus; COPD chronic obstructive pulmonary disease

## 2022-10-24 ENCOUNTER — Encounter (INDEPENDENT_AMBULATORY_CARE_PROVIDER_SITE_OTHER): Payer: Medicare (Managed Care) | Admitting: Ophthalmology

## 2022-10-29 ENCOUNTER — Encounter (INDEPENDENT_AMBULATORY_CARE_PROVIDER_SITE_OTHER): Payer: Self-pay | Admitting: Ophthalmology

## 2022-10-29 ENCOUNTER — Ambulatory Visit (INDEPENDENT_AMBULATORY_CARE_PROVIDER_SITE_OTHER): Payer: Medicare (Managed Care) | Admitting: Ophthalmology

## 2022-10-29 DIAGNOSIS — Z7985 Long-term (current) use of injectable non-insulin antidiabetic drugs: Secondary | ICD-10-CM | POA: Diagnosis not present

## 2022-10-29 DIAGNOSIS — E113413 Type 2 diabetes mellitus with severe nonproliferative diabetic retinopathy with macular edema, bilateral: Secondary | ICD-10-CM

## 2022-10-29 DIAGNOSIS — H35033 Hypertensive retinopathy, bilateral: Secondary | ICD-10-CM

## 2022-10-29 DIAGNOSIS — Z961 Presence of intraocular lens: Secondary | ICD-10-CM

## 2022-10-29 DIAGNOSIS — H25812 Combined forms of age-related cataract, left eye: Secondary | ICD-10-CM

## 2022-10-29 DIAGNOSIS — I10 Essential (primary) hypertension: Secondary | ICD-10-CM

## 2022-10-29 DIAGNOSIS — H34813 Central retinal vein occlusion, bilateral, with macular edema: Secondary | ICD-10-CM | POA: Diagnosis not present

## 2022-10-29 DIAGNOSIS — H401111 Primary open-angle glaucoma, right eye, mild stage: Secondary | ICD-10-CM

## 2022-10-29 MED ORDER — FARICIMAB-SVOA 6 MG/0.05ML IZ SOSY
6.0000 mg | PREFILLED_SYRINGE | INTRAVITREAL | Status: AC | PRN
  Administered 2022-10-29: 6 mg via INTRAVITREAL

## 2022-11-26 ENCOUNTER — Encounter (INDEPENDENT_AMBULATORY_CARE_PROVIDER_SITE_OTHER): Payer: Medicare (Managed Care) | Admitting: Ophthalmology

## 2022-11-26 DIAGNOSIS — H401111 Primary open-angle glaucoma, right eye, mild stage: Secondary | ICD-10-CM

## 2022-11-26 DIAGNOSIS — I10 Essential (primary) hypertension: Secondary | ICD-10-CM

## 2022-11-26 DIAGNOSIS — Z7985 Long-term (current) use of injectable non-insulin antidiabetic drugs: Secondary | ICD-10-CM

## 2022-11-26 DIAGNOSIS — H34813 Central retinal vein occlusion, bilateral, with macular edema: Secondary | ICD-10-CM

## 2022-11-26 DIAGNOSIS — E113413 Type 2 diabetes mellitus with severe nonproliferative diabetic retinopathy with macular edema, bilateral: Secondary | ICD-10-CM

## 2022-11-26 DIAGNOSIS — Z961 Presence of intraocular lens: Secondary | ICD-10-CM

## 2022-11-26 DIAGNOSIS — H35033 Hypertensive retinopathy, bilateral: Secondary | ICD-10-CM

## 2022-11-26 DIAGNOSIS — H25812 Combined forms of age-related cataract, left eye: Secondary | ICD-10-CM

## 2022-11-28 NOTE — Progress Notes (Signed)
[facility] - Clinic Note  11/29/2022     CHIEF COMPLAINT Patient presents for Retina Follow Up    HISTORY OF PRESENT ILLNESS: Mary [name] is a 77 y.o. female who presents to the clinic today for:   HPI     Retina Follow Up   Patient presents with  CRVO/BRVO.  In both eyes.  This started 4 weeks ago.  I, the attending physician,  performed the HPI with the patient and updated documentation appropriately.        Comments   Patient here for 4 weeks retina follow up for CRVO OU. Patient states vision terrible. Can't see but just a little bit. It has gotten worse. At night has burning. Uses drops purple top BID OU and yellow top BID OD.      Last edited by Rennis Chris, MD on 11/29/2022  2:55 PM.     Pt state she cannot see, she states she is using her IOP drops as directed, she does not have a follow up appt with Dr. Lottie Dawson   Referring physician: No referring provider defined for this encounter.  HISTORICAL INFORMATION:   Selected notes from the MEDICAL RECORD NUMBER Transferred care from Dr. Ashley Royalty   CURRENT MEDICATIONS: Current Outpatient Medications (Ophthalmic Drugs)  Medication Sig   brimonidine (ALPHAGAN) 0.2 % ophthalmic solution INSTILL 1 DROP INTO BOTH EYES TWICE A DAY (Patient taking differently: Place 1 drop into both eyes 3 (three) times daily.)   dorzolamide-timolol (COSOPT) 22.3-6.8 MG/ML ophthalmic solution Place 1 drop into the right eye 2 (two) times daily. (Patient taking differently: Place 1 drop into both eyes 2 (two) times daily.)   timolol (BETIMOL) 0.25 % ophthalmic solution Place 1 drop into both eyes 2 (two) times daily.   No current facility-administered medications for this visit. (Ophthalmic Drugs)   Current Outpatient Medications (Other)  Medication Sig   amLODipine (NORVASC) 5 MG tablet Take 1 tablet (5 mg total) by mouth daily.   aspirin 81 MG chewable tablet Chew 81 mg by mouth daily.   Assure Comfort Lancets  28G MISC    benzonatate (TESSALON) 200 MG capsule Take 1 capsule (200 mg total) by mouth 3 (three) times daily.   Blood Glucose Monitoring Suppl (ONETOUCH VERIO FLEX SYSTEM) w/Device KIT Use as directed to check blood sugars 2 times per day dx: e11.65   cetirizine (ZYRTEC) 10 MG tablet Take 1 tablet (10 mg total) by mouth daily.   Cholecalciferol (VITAMIN D) 50 MCG (2000 UT) CAPS Take 2,000 Units by mouth daily at 6 (six) AM.   Emollient (BAG BALM) OINT Apply 1 Application topically 4 (four) times daily as needed (itching).   fluticasone (FLONASE) 50 MCG/ACT nasal spray SPRAY 1 SPRAY INTO EACH NOSTRIL EVERY DAY (Patient taking differently: Place 1 spray into both nostrils daily.)   furosemide (LASIX) 40 MG tablet TAKE 1 TABLET BY MOUTH  DAILY   glucose blood (ONETOUCH VERIO) test strip Use as directed to check blood sugars 2 times per day dx: e11.65   HYDROcodone-acetaminophen (NORCO) 10-325 MG tablet Take 1 tablet by mouth 5 (five) times daily as needed for moderate pain.   Ipratropium-Albuterol (COMBIVENT) 20-100 MCG/ACT AERS respimat Inhale 1 puff into the lungs 2 (two) times daily.   levothyroxine (SYNTHROID) 125 MCG tablet Take 125 mcg by mouth daily before breakfast.   loperamide (IMODIUM) 2 MG capsule Take 2 mg by mouth every 6 (six) hours as needed for diarrhea or loose stools.  metoprolol succinate (TOPROL-XL) 50 MG 24 hr tablet TAKE 1 TABLET BY MOUTH  DAILY (Patient taking differently: Take 50 mg by mouth daily.)   montelukast (SINGULAIR) 10 MG tablet Take 10 mg by mouth at bedtime.   nystatin cream (MYCOSTATIN) Apply 1 Application topically 2 (two) times daily as needed for dry skin.   omeprazole (PRILOSEC) 20 MG capsule TAKE 1 CAPSULE BY MOUTH  DAILY (Patient taking differently: Take 20 mg by mouth daily.)   OneTouch Delica Lancets 33G MISC Use as directed to check blood sugars 2 times per day dx: e11.65   pravastatin (PRAVACHOL) 40 MG tablet TAKE 1 TABLET BY MOUTH EVERY DAY (Patient  taking differently: Take 80 mg by mouth daily.)   telmisartan (MICARDIS) 80 MG tablet TAKE 1 TABLET BY MOUTH  DAILY (Patient taking differently: Take 80 mg by mouth daily.)   triamcinolone (KENALOG) 0.1 % APPLY TO AFFECTED AREA TWICE A DAY (Patient taking differently: Apply 1 Application topically 2 (two) times daily as needed (rash).)   No current facility-administered medications for this visit. (Other)   REVIEW OF SYSTEMS: ROS   Positive for: Skin, Endocrine, Cardiovascular, Eyes Negative for: Constitutional, Gastrointestinal, Neurological, Genitourinary, Musculoskeletal, HENT, Respiratory, Psychiatric, Allergic/Imm, Heme/Lymph Last edited by Laddie Aquas, COA on 11/29/2022 10:01 AM.     ALLERGIES No Known Allergies  PAST MEDICAL HISTORY Past Medical History:  Diagnosis Date   Arthritis    osteoarthritis   Cataract    OS   Diabetes mellitus    Insulin dependent   Diabetic retinopathy (HCC)    NPDR OU   Glaucoma    POAG OD   H/O cardiovascular stress test    pt. reports 10/13- was normal per pt.    Hyperlipemia    Hypertension    Hypertensive retinopathy    OU   Past Surgical History:  Procedure Laterality Date   CARPAL TUNNEL RELEASE Bilateral    CATARACT EXTRACTION Right 10/05/2018   Dr. Alben Spittle   EYE SURGERY Right    Cat Sx   KNEE ARTHROSCOPY     right   QUADRICEPS TENDON REPAIR  02/17/2012   Procedure: REPAIR QUADRICEP TENDON;  Surgeon: Dannielle Huh, MD;  Location: [facility] OR;  Service: Orthopedics;  Laterality: Right;  right quadriceps repair with latera release   QUADRICEPS TENDON REPAIR Right 05/15/2012   Procedure: REPAIR QUADRICEP TENDON;  Surgeon: Dannielle Huh, MD;  Location: [facility] OR;  Service: Orthopedics;  Laterality: Right;   TOTAL KNEE ARTHROPLASTY  10/21/2011   Procedure: TOTAL KNEE ARTHROPLASTY;  Surgeon: Raymon Mutton, MD;  Location: [facility] OR;  Service: Orthopedics;  Laterality: Right;   TUBAL LIGATION     FAMILY HISTORY Family History  Problem  Relation Age of Onset   Hypertension Mother    Heart Problems Father    Gout Father    Arthritis Father    SOCIAL HISTORY Social History   Tobacco Use   Smoking status: Former    Current packs/day: 0.00    Average packs/day: 1 pack/day for 47.5 years (47.5 ttl pk-yrs)    Types: Cigarettes    Start date: 01/15/1967    Quit date: 06/28/2014    Years since quitting: 8.4   Smokeless tobacco: Never  Vaping Use   Vaping status: Never Used  Substance Use Topics   Alcohol use: No   Drug use: Yes    Types: Hydrocodone       OPHTHALMIC EXAM: Base Eye Exam     Visual Acuity (Snellen - Linear)  Right Left   Dist cc 20/200 HM         Tonometry (Tonopen, 9:58 AM)       Right Left   Pressure 22 19         Pupils       Dark Light Shape React APD   Right 2 1 Round Sluggish None   Left 2 2 Round NR None         Visual Fields (Counting fingers)       Left Right     Full   Restrictions Total superior temporal, inferior temporal, superior nasal, inferior nasal deficiencies          Extraocular Movement       Right Left    Full, Ortho Full, Ortho         Neuro/Psych     Oriented x3: Yes   Mood/Affect: Normal         Dilation     Both eyes: 1.0% Mydriacyl, 2.5% Phenylephrine @ 9:58 AM           Slit Lamp and Fundus Exam     Slit Lamp Exam       Right Left   Lids/Lashes Dermatochalasis - upper lid, mild Meibomian gland dysfunction Dermatochalasis - upper lid, mild Meibomian gland dysfunction   Conjunctiva/Sclera Melanosis Melanosis   Cornea Trace, Punctate epithelial erosions, well healed temporal cataract wounds, arcus, trace EBMD Mild arcus, trace PEE   Anterior Chamber Deep and quiet Deep and quiet   Iris Round and poorly dilated to 3.71mm Round and moderately dilated to 6mm   Lens PC IOL in good position, trace, cortical remnant temporally, PC folds 3+ Nuclear sclerosis with brunescense, 3+ Cortical cataract   Anterior Vitreous Vitreous  syneresis, Ozurdex pellet remnants inferiorly, mild blood stained vitreous condensations -- now white -- slightly improved Vitreous syneresis         Fundus Exam       Right Left   Disc 2-3+Pallor, Sharp rim, temporal PPP/PPA, thin superior rim, +cupping, vascular loops, no heme Mild Pallor, Sharp rim, PPP, severely attenuated vessels   C/D Ratio 0.9 0.3   Macula Blunted foveal reflex, Drusen, Retinal pigment epithelial mottling, punctate exudates - improved, scattered DBH greatest inferior macula -- improved, interval improvement in edema Flat, Blunted foveal reflex, RPE mottling and clumping, scarring, atrophy, no heme   Vessels attenuated, Tortuous Severe Vascular attenuation, +fibrosis   Periphery Attached, 360 punctate IRH Attached, pavingstone and reticular degeneration inferiorly, scattered RPE atrophy, greatest peripapillary           Refraction     Wearing Rx       Sphere Cylinder Axis Add   Right -1.00 +0.75 180 +2.25   Left -0.50 +0.75 155 +2.25           IMAGING AND PROCEDURES  Imaging and Procedures for @TODAY @  OCT, Retina - OU - Both Eyes       Right Eye Quality was good. Central Foveal Thickness: 220. Progression has improved. Findings include no SRF, abnormal foveal contour, intraretinal hyper-reflective material, intraretinal fluid, vitreomacular adhesion (Mild Interval improvement in IRF/IRHM greatest inferior mac and fovea).   Left Eye Quality was borderline. Central Foveal Thickness: 255. Progression has been stable. Findings include no SRF, abnormal foveal contour, subretinal hyper-reflective material, epiretinal membrane, intraretinal fluid, pigment epithelial detachment, outer retinal atrophy, preretinal fibrosis (Stable improvement in scattered cystic changes, persistent central ORA / SRHM).   Notes *Images captured and stored on drive  Diagnosis / Impression:  OD: Mild Interval improvement in IRF/IRHM greatest inferior mac and fovea OS:  stable improvement in scattered cystic changes, persistent central ORA / SRHM  Clinical management:  See below  Abbreviations: NFP - Normal foveal profile. CME - cystoid macular edema. PED - pigment epithelial detachment. IRF - intraretinal fluid. SRF - subretinal fluid. EZ - ellipsoid zone. ERM - epiretinal membrane. ORA - outer retinal atrophy. ORT - outer retinal tubulation. SRHM - subretinal hyper-reflective material      Intravitreal Injection, Pharmacologic Agent - OD - Right Eye       Time Out 11/29/2022. 11:34 AM. Confirmed correct patient, procedure, site, and patient consented.   Anesthesia Topical anesthesia was used. Anesthetic medications included Lidocaine 2%, Proparacaine 0.5%.   Procedure Preparation included 5% betadine to ocular surface, eyelid speculum. A (32g) needle was used.   Injection: 6 mg faricimab-svoa 6 MG/0.05ML   Route: Intravitreal, Site: Right Eye   NDC: [id number], Lot: [id number], Expiration date: 04/13/2024, Waste: 0 mL   Post-op Post injection exam found visual acuity of at least counting fingers, no retinal detachment, perfused optic nerve. The patient tolerated the procedure well. There were no complications. The patient received written and verbal post procedure care education.   Notes An AC tap was performed following injection due to elevated IOP using a 30 gauge needle on a syringe with the plunger removed. The needle was placed at the limbus at 7 oclock and approximately 0.08cc of aqueous was removed from the anterior chamber. Betadine was applied to the tap area before and after the paracentesis was performed. There were no complications. The patient tolerated the procedure well. The IOP was rechecked and was found to be ~10 mmHg by digital palpation.            ASSESSMENT/PLAN:    ICD-10-CM   1. Central retinal vein occlusion with macular edema of both eyes  H34.8130 OCT, Retina - OU - Both Eyes    Intravitreal Injection,  Pharmacologic Agent - OD - Right Eye    faricimab-svoa (VABYSMO) 6mg /0.16mL intravitreal injection    2. Severe nonproliferative diabetic retinopathy of both eyes with macular edema associated with type 2 diabetes mellitus (HCC)  G18.2993     3. Long-term (current) use of injectable non-insulin antidiabetic drugs  Z79.85     4. Essential hypertension  I10     5. Hypertensive retinopathy of both eyes  H35.033     6. Combined forms of age-related cataract of left eye  H25.812     7. Pseudophakia  Z96.1     8. Primary open angle glaucoma (POAG) of right eye, mild stage  H40.1111      1. CRVO w/ CME OU  - delayed f/u -- 6+ wks instead of 4  - pt of Dr. Ashley Royalty who wished to transfer care - OS long-standing low vision (CF) with subfoveal scar and central retinal atrophy - OD with CME that was actively being managed with Ozurdex OD ~q5 wks by Dr. Ashley Royalty (last on 8.20.20) s/p multiple IVTA and Ozurdex OD - was due for Ozurdex OD w/ Ashley Royalty on 9.25.20, but underwent cataract surgery OD w/ Alben Spittle on 9.21.20 - s/p IVTA OD #4 (10.26.20), #5 (12.10.20), #6 (01.21.21), #7 (03.05.21), #8 (04.16.21), #9 (05.28.21), #10 (07.09.21), #11 (09.14.21), #12 (10.29.21), #13 (12.17.21), #14 (01.27.22), #15 (03.11.2022), #16 (05.04.22), #17 (06.17.22), #18 (07.29.22), #19 (09.16.22), #20 (10.28.22), #21 (12.16.22), #22 (02.03.23) #23 (04.07.23), #24 (06.14.23) - s/p IVA OD #1 (07.17.23), #2 (08.14.23), #3 (  09.14.23), #4 (10.18.23) #5 (12.01.24) - s/p IVE OD #1 (01.26.24), #2 (02.23.24), #3 (03.29.24), #4 (04.26.24), #5 (05.24.24) - s/p IVV OD #1 (07.03.24), #2 (08.30.24), #3 (10.15.24)  - BCVA OD 20/200 from 20/150  - exam shows stable improvement in blood stained vit condensations - OCT shows OD: Mild Interval improvement in IRF/IRHM greatest inferior mac and fovea; OS: Mild interval improvement in scattered cystic changes, persistent central ORA / SRHM at 6+ weeks  - recommend IVV OD #4 today, 11.15.24,  w/ f/u in 4 wks  - pt wishes to proceed with injection  - RBA of procedure discussed, questions answered - see procedure note -- AC tap performed - IVV informed consent obtained and signed, 07.03.24 (OD) - PACE approved Vabysmo OD  - f/u 4 weeks -- DFE/OCT/possible injection  2,3. Severe Non-proliferative diabetic retinopathy OU.  - OCT shows diabetic macular edema, OD -- slightly increased  - s/p IVTA OD on 06.14.23 as above  - s/p IVA OD #1 on 07.17.23 as above for DME as well as CRVO  - IVV OD #4 today as above due to IVE resistance  4,5. Hypertensive retinopathy OU  - discussed importance of tight BP control  - continue to monitor  6. Mixed form age related cataract OS  - under the expert management of [facility]  7. Pseudophakia OD  - s/p CE/IOL OD (9.21.20) by expert surgeon, Dr. Alben Spittle  - IOL in excellent position  - had post op IOP spike -- IOP now under better control  - continue to monitor  8. POAG OD  - now under the expert management of Dr. Lottie Dawson (initial consult 07.27.23)  - s/p SLT and goniotomy OD w/ Dr. Alben Spittle  - had post-cataract IOP spike  - on Brimonidine TID OU and Timolol BID OU -- no longer using Cosopt  - IOP 22 today -- AC tap performed post injection  Return in about 4 weeks (around 12/27/2022) for CRVO OD, Dilated Exam, OCT, Possible Injxn.   Ophthalmic Meds Ordered this visit:  Meds ordered this encounter  Medications   faricimab-svoa (VABYSMO) 6mg /0.7mL intravitreal injection    Electronically signed by: Berlin Hun COT 11.14.24 2:13 AM  This document serves as a record of services personally performed by Karie Chimera, MD, PhD. It was created on their behalf by Glee Arvin. Manson Passey, OA an ophthalmic technician. The creation of this record is the provider's dictation and/or activities during the visit.    Electronically signed by: Glee Arvin. Manson Passey, OA 12/01/22 2:13 AM  Karie Chimera, M.D., Ph.D. Diseases & Surgery of the Retina  and Vitreous [facility]  I have reviewed the above documentation for accuracy and completeness, and I agree with the above. Karie Chimera, M.D., Ph.D. 12/01/22 2:14 AM   Abbreviations: M myopia (nearsighted); A astigmatism; H hyperopia (farsighted); P presbyopia; Mrx spectacle prescription;  CTL contact lenses; OD right eye; OS left eye; OU both eyes  XT exotropia; ET esotropia; PEK punctate epithelial keratitis; PEE punctate epithelial erosions; DES dry eye syndrome; MGD meibomian gland dysfunction; ATs artificial tears; PFAT's preservative free artificial tears; NSC nuclear sclerotic cataract; PSC posterior subcapsular cataract; ERM epi-retinal membrane; PVD posterior vitreous detachment; RD retinal detachment; DM diabetes mellitus; DR diabetic retinopathy; NPDR non-proliferative diabetic retinopathy; PDR proliferative diabetic retinopathy; CSME clinically significant macular edema; DME diabetic macular edema; dbh dot blot hemorrhages; CWS cotton wool spot; POAG primary open angle glaucoma; C/D cup-to-disc ratio; HVF humphrey visual field; GVF goldmann  visual field; OCT optical coherence tomography; IOP intraocular pressure; BRVO Branch retinal vein occlusion; CRVO central retinal vein occlusion; CRAO central retinal artery occlusion; BRAO branch retinal artery occlusion; RT retinal tear; SB scleral buckle; PPV pars plana vitrectomy; VH Vitreous hemorrhage; PRP panretinal laser photocoagulation; IVK intravitreal kenalog; VMT vitreomacular traction; MH Macular hole;  NVD neovascularization of the disc; NVE neovascularization elsewhere; AREDS age related eye disease study; ARMD age related macular degeneration; POAG primary open angle glaucoma; EBMD epithelial/anterior basement membrane dystrophy; ACIOL anterior chamber intraocular lens; IOL intraocular lens; PCIOL posterior chamber intraocular lens; Phaco/IOL phacoemulsification with intraocular lens placement; PRK photorefractive  keratectomy; LASIK laser assisted in situ keratomileusis; HTN hypertension; DM diabetes mellitus; COPD chronic obstructive pulmonary disease

## 2022-11-29 ENCOUNTER — Encounter (INDEPENDENT_AMBULATORY_CARE_PROVIDER_SITE_OTHER): Payer: Self-pay | Admitting: Ophthalmology

## 2022-11-29 ENCOUNTER — Ambulatory Visit (INDEPENDENT_AMBULATORY_CARE_PROVIDER_SITE_OTHER): Payer: Medicare (Managed Care) | Admitting: Ophthalmology

## 2022-11-29 DIAGNOSIS — Z7985 Long-term (current) use of injectable non-insulin antidiabetic drugs: Secondary | ICD-10-CM

## 2022-11-29 DIAGNOSIS — I10 Essential (primary) hypertension: Secondary | ICD-10-CM | POA: Diagnosis not present

## 2022-11-29 DIAGNOSIS — E113413 Type 2 diabetes mellitus with severe nonproliferative diabetic retinopathy with macular edema, bilateral: Secondary | ICD-10-CM | POA: Diagnosis not present

## 2022-11-29 DIAGNOSIS — H34813 Central retinal vein occlusion, bilateral, with macular edema: Secondary | ICD-10-CM | POA: Diagnosis not present

## 2022-11-29 DIAGNOSIS — Z961 Presence of intraocular lens: Secondary | ICD-10-CM

## 2022-11-29 DIAGNOSIS — H401111 Primary open-angle glaucoma, right eye, mild stage: Secondary | ICD-10-CM

## 2022-11-29 DIAGNOSIS — H35033 Hypertensive retinopathy, bilateral: Secondary | ICD-10-CM

## 2022-11-29 DIAGNOSIS — H25812 Combined forms of age-related cataract, left eye: Secondary | ICD-10-CM

## 2022-11-29 MED ORDER — FARICIMAB-SVOA 6 MG/0.05ML IZ SOSY
6.0000 mg | PREFILLED_SYRINGE | INTRAVITREAL | Status: AC | PRN
  Administered 2022-11-29: 6 mg via INTRAVITREAL

## 2022-12-17 NOTE — Progress Notes (Signed)
[facility] - Clinic Note  12/31/2022     CHIEF COMPLAINT Patient presents for Retina Follow Up    HISTORY OF PRESENT ILLNESS: Mary [name] is a 77 y.o. female who presents to the clinic today for:   HPI     Retina Follow Up   Patient presents with  CRVO/BRVO.  In right eye.  This started 4 weeks ago.  I, the attending physician,  performed the HPI with the patient and updated documentation appropriately.        Comments   Patient here for 4 weeks retina follow up for CRVO OD. Patient states vision doing terrible. No eye pain. Broke glasses.       Last edited by Rennis Chris, MD on 12/31/2022 12:46 PM.    Pt state she cannot see   Referring physician: No referring provider defined for this encounter.  HISTORICAL INFORMATION:   Selected notes from the MEDICAL RECORD NUMBER Transferred care from Dr. Ashley Royalty   CURRENT MEDICATIONS: Current Outpatient Medications (Ophthalmic Drugs)  Medication Sig   brimonidine (ALPHAGAN) 0.2 % ophthalmic solution INSTILL 1 DROP INTO BOTH EYES TWICE A DAY (Patient taking differently: Place 1 drop into both eyes 3 (three) times daily.)   dorzolamide-timolol (COSOPT) 22.3-6.8 MG/ML ophthalmic solution Place 1 drop into the right eye 2 (two) times daily. (Patient taking differently: Place 1 drop into both eyes 2 (two) times daily.)   timolol (BETIMOL) 0.25 % ophthalmic solution Place 1 drop into both eyes 2 (two) times daily.   No current facility-administered medications for this visit. (Ophthalmic Drugs)   Current Outpatient Medications (Other)  Medication Sig   amLODipine (NORVASC) 5 MG tablet Take 1 tablet (5 mg total) by mouth daily.   aspirin 81 MG chewable tablet Chew 81 mg by mouth daily.   Assure Comfort Lancets 28G MISC    benzonatate (TESSALON) 200 MG capsule Take 1 capsule (200 mg total) by mouth 3 (three) times daily.   Blood Glucose Monitoring Suppl (ONETOUCH VERIO FLEX SYSTEM) w/Device KIT Use as  directed to check blood sugars 2 times per day dx: e11.65   cetirizine (ZYRTEC) 10 MG tablet Take 1 tablet (10 mg total) by mouth daily.   Cholecalciferol (VITAMIN D) 50 MCG (2000 UT) CAPS Take 2,000 Units by mouth daily at 6 (six) AM.   Emollient (BAG BALM) OINT Apply 1 Application topically 4 (four) times daily as needed (itching).   fluticasone (FLONASE) 50 MCG/ACT nasal spray SPRAY 1 SPRAY INTO EACH NOSTRIL EVERY DAY (Patient taking differently: Place 1 spray into both nostrils daily.)   furosemide (LASIX) 40 MG tablet TAKE 1 TABLET BY MOUTH  DAILY   glucose blood (ONETOUCH VERIO) test strip Use as directed to check blood sugars 2 times per day dx: e11.65   HYDROcodone-acetaminophen (NORCO) 10-325 MG tablet Take 1 tablet by mouth 5 (five) times daily as needed for moderate pain.   Ipratropium-Albuterol (COMBIVENT) 20-100 MCG/ACT AERS respimat Inhale 1 puff into the lungs 2 (two) times daily.   levothyroxine (SYNTHROID) 125 MCG tablet Take 125 mcg by mouth daily before breakfast.   loperamide (IMODIUM) 2 MG capsule Take 2 mg by mouth every 6 (six) hours as needed for diarrhea or loose stools.   metoprolol succinate (TOPROL-XL) 50 MG 24 hr tablet TAKE 1 TABLET BY MOUTH  DAILY (Patient taking differently: Take 50 mg by mouth daily.)   montelukast (SINGULAIR) 10 MG tablet Take 10 mg by mouth at bedtime.   nystatin cream (  MYCOSTATIN) Apply 1 Application topically 2 (two) times daily as needed for dry skin.   omeprazole (PRILOSEC) 20 MG capsule TAKE 1 CAPSULE BY MOUTH  DAILY (Patient taking differently: Take 20 mg by mouth daily.)   OneTouch Delica Lancets 33G MISC Use as directed to check blood sugars 2 times per day dx: e11.65   pravastatin (PRAVACHOL) 40 MG tablet TAKE 1 TABLET BY MOUTH EVERY DAY (Patient taking differently: Take 80 mg by mouth daily.)   telmisartan (MICARDIS) 80 MG tablet TAKE 1 TABLET BY MOUTH  DAILY (Patient taking differently: Take 80 mg by mouth daily.)   triamcinolone  (KENALOG) 0.1 % APPLY TO AFFECTED AREA TWICE A DAY (Patient taking differently: Apply 1 Application topically 2 (two) times daily as needed (rash).)   No current facility-administered medications for this visit. (Other)   REVIEW OF SYSTEMS: ROS   Positive for: Skin, Endocrine, Cardiovascular, Eyes Negative for: Constitutional, Gastrointestinal, Neurological, Genitourinary, Musculoskeletal, HENT, Respiratory, Psychiatric, Allergic/Imm, Heme/Lymph Last edited by Laddie Aquas, COA on 12/31/2022  9:31 AM.      ALLERGIES No Known Allergies  PAST MEDICAL HISTORY Past Medical History:  Diagnosis Date   Arthritis    osteoarthritis   Cataract    OS   Diabetes mellitus    Insulin dependent   Diabetic retinopathy (HCC)    NPDR OU   Glaucoma    POAG OD   H/O cardiovascular stress test    pt. reports 10/13- was normal per pt.    Hyperlipemia    Hypertension    Hypertensive retinopathy    OU   Past Surgical History:  Procedure Laterality Date   CARPAL TUNNEL RELEASE Bilateral    CATARACT EXTRACTION Right 10/05/2018   Dr. Alben Spittle   EYE SURGERY Right    Cat Sx   KNEE ARTHROSCOPY     right   QUADRICEPS TENDON REPAIR  02/17/2012   Procedure: REPAIR QUADRICEP TENDON;  Surgeon: Dannielle Huh, MD;  Location: [facility] OR;  Service: Orthopedics;  Laterality: Right;  right quadriceps repair with latera release   QUADRICEPS TENDON REPAIR Right 05/15/2012   Procedure: REPAIR QUADRICEP TENDON;  Surgeon: Dannielle Huh, MD;  Location: [facility] OR;  Service: Orthopedics;  Laterality: Right;   TOTAL KNEE ARTHROPLASTY  10/21/2011   Procedure: TOTAL KNEE ARTHROPLASTY;  Surgeon: Raymon Mutton, MD;  Location: [facility] OR;  Service: Orthopedics;  Laterality: Right;   TUBAL LIGATION     FAMILY HISTORY Family History  Problem Relation Age of Onset   Hypertension Mother    Heart Problems Father    Gout Father    Arthritis Father    SOCIAL HISTORY Social History   Tobacco Use   Smoking status: Former     Current packs/day: 0.00    Average packs/day: 1 pack/day for 47.5 years (47.5 ttl pk-yrs)    Types: Cigarettes    Start date: 01/15/1967    Quit date: 06/28/2014    Years since quitting: 8.5   Smokeless tobacco: Never  Vaping Use   Vaping status: Never Used  Substance Use Topics   Alcohol use: No   Drug use: Yes    Types: Hydrocodone       OPHTHALMIC EXAM: Base Eye Exam     Visual Acuity (Snellen - Linear)       Right Left   Dist [place] 20/250 -1 HM   Dist ph [place] NI          Tonometry (Tonopen, 9:28 AM)  Right Left   Pressure 15 18         Pupils       Dark Light Shape React APD   Right 2 1 Round Sluggish None   Left 2 2 Round NR None         Visual Fields (Counting fingers)       Left Right     Full   Restrictions Total superior temporal, inferior temporal, superior nasal, inferior nasal deficiencies          Extraocular Movement       Right Left    Full, Ortho Full, Ortho         Neuro/Psych     Oriented x3: Yes   Mood/Affect: Normal         Dilation     Both eyes: 1.0% Mydriacyl, 2.5% Phenylephrine @ 9:28 AM           Slit Lamp and Fundus Exam     Slit Lamp Exam       Right Left   Lids/Lashes Dermatochalasis - upper lid, mild Meibomian gland dysfunction Dermatochalasis - upper lid, mild Meibomian gland dysfunction   Conjunctiva/Sclera Melanosis Melanosis   Cornea Trace, Punctate epithelial erosions, well healed temporal cataract wounds, arcus, trace EBMD Mild arcus, trace PEE   Anterior Chamber Deep and quiet Deep and quiet   Iris Round and poorly dilated to 3.52mm Round and moderately dilated to 6mm   Lens PC IOL in good position, trace, cortical remnant temporally, PC folds 3+ Nuclear sclerosis with brunescense, 3+ Cortical cataract   Anterior Vitreous Vitreous syneresis, Ozurdex pellet remnants inferiorly, mild blood stained vitreous condensations -- now white -- slightly improved Vitreous syneresis         Fundus Exam        Right Left   Disc 2-3+Pallor, Sharp rim, temporal PPP/PPA, thin superior rim, +cupping, vascular loops, no heme Mild Pallor, Sharp rim, PPP, severely attenuated vessels   C/D Ratio 0.9 0.3   Macula Blunted foveal reflex, Drusen, Retinal pigment epithelial mottling, punctate exudates - improved, scattered DBH greatest inferior macula -- improved, mild interval increase in edema Flat, Blunted foveal reflex, RPE mottling and clumping, scarring, atrophy, no heme   Vessels attenuated, Tortuous Severe Vascular attenuation, +fibrosis   Periphery Attached, 360 punctate IRH Attached, pavingstone and reticular degeneration inferiorly, scattered RPE atrophy, greatest peripapillary           Refraction     Wearing Rx       Sphere Cylinder Axis Add   Right -1.00 +0.75 180 +2.25   Left -0.50 +0.75 155 +2.25           IMAGING AND PROCEDURES  Imaging and Procedures for @TODAY @  OCT, Retina - OU - Both Eyes       Right Eye Quality was good. Central Foveal Thickness: 224. Progression has worsened. Findings include no SRF, abnormal foveal contour, intraretinal hyper-reflective material, intraretinal fluid, vitreomacular adhesion (Mild interval increase in IRF/IRHM greatest inferior mac and fovea).   Left Eye Quality was borderline. Central Foveal Thickness: 287. Progression has been stable. Findings include no SRF, abnormal foveal contour, subretinal hyper-reflective material, epiretinal membrane, intraretinal fluid, pigment epithelial detachment, outer retinal atrophy, preretinal fibrosis (Stable improvement in scattered cystic changes, persistent central ORA / SRHM).   Notes *Images captured and stored on drive  Diagnosis / Impression:  OD: Mild Interval increase in IRF/IRHM greatest inferior mac and fovea OS: stable improvement in scattered cystic changes, persistent central ORA /  SRHM  Clinical management:  See below  Abbreviations: NFP - Normal foveal profile. CME - cystoid  macular edema. PED - pigment epithelial detachment. IRF - intraretinal fluid. SRF - subretinal fluid. EZ - ellipsoid zone. ERM - epiretinal membrane. ORA - outer retinal atrophy. ORT - outer retinal tubulation. SRHM - subretinal hyper-reflective material      Intravitreal Injection, Pharmacologic Agent - OD - Right Eye       Time Out 12/31/2022. 10:41 AM. Confirmed correct patient, procedure, site, and patient consented.   Anesthesia Topical anesthesia was used. Anesthetic medications included Lidocaine 2%, Proparacaine 0.5%.   Procedure Preparation included 5% betadine to ocular surface, eyelid speculum. A (32g) needle was used.   Injection: 6 mg faricimab-svoa 6 MG/0.05ML   Route: Intravitreal, Site: Right Eye   NDC: 40981-191-47, Lot: [id number], Expiration date: 11/14/2023, Waste: 0 mL   Post-op Post injection exam found visual acuity of at least counting fingers, no retinal detachment, perfused optic nerve. The patient tolerated the procedure well. There were no complications. The patient received written and verbal post procedure care education.             ASSESSMENT/PLAN:    ICD-10-CM   1. Central retinal vein occlusion with macular edema of both eyes  H34.8130 OCT, Retina - OU - Both Eyes    Intravitreal Injection, Pharmacologic Agent - OD - Right Eye    faricimab-svoa (VABYSMO) 6mg /0.18mL intravitreal injection    2. Severe nonproliferative diabetic retinopathy of both eyes with macular edema associated with type 2 diabetes mellitus (HCC)  Y86.5784     3. Long-term (current) use of injectable non-insulin antidiabetic drugs  Z79.85     4. Essential hypertension  I10     5. Hypertensive retinopathy of both eyes  H35.033     6. Combined forms of age-related cataract of left eye  H25.812     7. Pseudophakia  Z96.1     8. Primary open angle glaucoma (POAG) of right eye, mild stage  H40.1111      1. CRVO w/ CME OU  - delayed f/u -- 6+ wks instead of 4  - pt of  Dr. Ashley Royalty who wished to transfer care - OS long-standing low vision (CF) with subfoveal scar and central retinal atrophy - OD with CME that was actively being managed with Ozurdex OD ~q5 wks by Dr. Ashley Royalty (last on 8.20.20) s/p multiple IVTA and Ozurdex OD - was due for Ozurdex OD w/ Ashley Royalty on 9.25.20, but underwent cataract surgery OD w/ Alben Spittle on 9.21.20 - s/p IVTA OD #4 (10.26.20), #5 (12.10.20), #6 (01.21.21), #7 (03.05.21), #8 (04.16.21), #9 (05.28.21), #10 (07.09.21), #11 (09.14.21), #12 (10.29.21), #13 (12.17.21), #14 (01.27.22), #15 (03.11.2022), #16 (05.04.22), #17 (06.17.22), #18 (07.29.22), #19 (09.16.22), #20 (10.28.22), #21 (12.16.22), #22 (02.03.23) #23 (04.07.23), #24 (06.14.23) - s/p IVA OD #1 (07.17.23), #2 (08.14.23), #3 (09.14.23), #4 (10.18.23) #5 (12.01.24) - s/p IVE OD #1 (01.26.24), #2 (02.23.24), #3 (03.29.24), #4 (04.26.24), #5 (05.24.24) - s/p IVV OD #1 (07.03.24), #2 (08.30.24), #3 (10.15.24), #4 (11.15.24)  - BCVA OD 20/250 from 20/200  - exam shows stable improvement in blood stained vit condensations - OCT shows OD: Mild Interval increase in IRF/IRHM greatest inferior mac and fovea; OS: stable improvement in scattered cystic changes, persistent central ORA / SRHM at 4+ weeks  - recommend IVV OD #5 today, 12.17.24, w/ f/u in 4 wks  - pt wishes to proceed with injection  - RBA of procedure discussed, questions answered - see procedure  note - IVV informed consent obtained and signed, 07.03.24 (OD) - PACE approved Vabysmo OD  - f/u 4 weeks -- DFE/OCT/possible injection  2,3. Severe Non-proliferative diabetic retinopathy OU.  - OCT shows diabetic macular edema, OD -- slightly increased  - s/p IVTA OD on 06.14.23 as above  - s/p IVA OD #1 on 07.17.23 as above for DME as well as CRVO  - IVV OD #5 today as above  4,5. Hypertensive retinopathy OU  - discussed importance of tight BP control  - continue to monitor  6. Mixed form age related cataract OS  - under  the expert management of [facility]  7. Pseudophakia OD  - s/p CE/IOL OD (9.21.20) by expert surgeon, Dr. Alben Spittle  - IOL in excellent position  - had post op IOP spike -- IOP now under better control  - continue to monitor  8. POAG OD  - now under the expert management of Dr. Lottie Dawson (initial consult 07.27.23)  - s/p SLT and goniotomy OD w/ Dr. Alben Spittle  - had post-cataract IOP spike  - on Brimonidine TID OU and Timolol BID OU -- no longer using Cosopt  - IOP 15 today -- AC tap performed post injection  Return in about 4 weeks (around 01/28/2023) for f/u CRVO OU, DFE, OCT, Possible Injxn.   Ophthalmic Meds Ordered this visit:  Meds ordered this encounter  Medications   faricimab-svoa (VABYSMO) 6mg /0.88mL intravitreal injection    This document serves as a record of services personally performed by Karie Chimera, MD, PhD. It was created on their behalf by Charlette Caffey, COT an ophthalmic technician. The creation of this record is the provider's dictation and/or activities during the visit.    Electronically signed by:  Charlette Caffey, COT  01/02/23 1:55 AM  This document serves as a record of services personally performed by Karie Chimera, MD, PhD. It was created on their behalf by Glee Arvin. Manson Passey, OA an ophthalmic technician. The creation of this record is the provider's dictation and/or activities during the visit.    Electronically signed by: Glee Arvin. Manson Passey, OA 01/02/23 1:55 AM  Karie Chimera, M.D., Ph.D. Diseases & Surgery of the Retina and Vitreous [facility]  I have reviewed the above documentation for accuracy and completeness, and I agree with the above. Karie Chimera, M.D., Ph.D. 01/02/23 1:56 AM   Abbreviations: M myopia (nearsighted); A astigmatism; H hyperopia (farsighted); P presbyopia; Mrx spectacle prescription;  CTL contact lenses; OD right eye; OS left eye; OU both eyes  XT exotropia; ET esotropia; PEK punctate epithelial  keratitis; PEE punctate epithelial erosions; DES dry eye syndrome; MGD meibomian gland dysfunction; ATs artificial tears; PFAT's preservative free artificial tears; NSC nuclear sclerotic cataract; PSC posterior subcapsular cataract; ERM epi-retinal membrane; PVD posterior vitreous detachment; RD retinal detachment; DM diabetes mellitus; DR diabetic retinopathy; NPDR non-proliferative diabetic retinopathy; PDR proliferative diabetic retinopathy; CSME clinically significant macular edema; DME diabetic macular edema; dbh dot blot hemorrhages; CWS cotton wool spot; POAG primary open angle glaucoma; C/D cup-to-disc ratio; HVF humphrey visual field; GVF goldmann visual field; OCT optical coherence tomography; IOP intraocular pressure; BRVO Branch retinal vein occlusion; CRVO central retinal vein occlusion; CRAO central retinal artery occlusion; BRAO branch retinal artery occlusion; RT retinal tear; SB scleral buckle; PPV pars plana vitrectomy; VH Vitreous hemorrhage; PRP panretinal laser photocoagulation; IVK intravitreal kenalog; VMT vitreomacular traction; MH Macular hole;  NVD neovascularization of the disc; NVE neovascularization elsewhere; AREDS age related  eye disease study; ARMD age related macular degeneration; POAG primary open angle glaucoma; EBMD epithelial/anterior basement membrane dystrophy; ACIOL anterior chamber intraocular lens; IOL intraocular lens; PCIOL posterior chamber intraocular lens; Phaco/IOL phacoemulsification with intraocular lens placement; PRK photorefractive keratectomy; LASIK laser assisted in situ keratomileusis; HTN hypertension; DM diabetes mellitus; COPD chronic obstructive pulmonary disease

## 2022-12-31 ENCOUNTER — Other Ambulatory Visit: Payer: Self-pay | Admitting: Internal Medicine

## 2022-12-31 ENCOUNTER — Ambulatory Visit (INDEPENDENT_AMBULATORY_CARE_PROVIDER_SITE_OTHER): Payer: Medicare (Managed Care) | Admitting: Ophthalmology

## 2022-12-31 ENCOUNTER — Encounter (INDEPENDENT_AMBULATORY_CARE_PROVIDER_SITE_OTHER): Payer: Self-pay | Admitting: Ophthalmology

## 2022-12-31 DIAGNOSIS — Z7985 Long-term (current) use of injectable non-insulin antidiabetic drugs: Secondary | ICD-10-CM

## 2022-12-31 DIAGNOSIS — H401111 Primary open-angle glaucoma, right eye, mild stage: Secondary | ICD-10-CM

## 2022-12-31 DIAGNOSIS — E113413 Type 2 diabetes mellitus with severe nonproliferative diabetic retinopathy with macular edema, bilateral: Secondary | ICD-10-CM | POA: Diagnosis not present

## 2022-12-31 DIAGNOSIS — H25812 Combined forms of age-related cataract, left eye: Secondary | ICD-10-CM

## 2022-12-31 DIAGNOSIS — H35033 Hypertensive retinopathy, bilateral: Secondary | ICD-10-CM

## 2022-12-31 DIAGNOSIS — Z87891 Personal history of nicotine dependence: Secondary | ICD-10-CM

## 2022-12-31 DIAGNOSIS — Z961 Presence of intraocular lens: Secondary | ICD-10-CM

## 2022-12-31 DIAGNOSIS — I10 Essential (primary) hypertension: Secondary | ICD-10-CM

## 2022-12-31 DIAGNOSIS — H34813 Central retinal vein occlusion, bilateral, with macular edema: Secondary | ICD-10-CM | POA: Diagnosis not present

## 2022-12-31 MED ORDER — FARICIMAB-SVOA 6 MG/0.05ML IZ SOSY
6.0000 mg | PREFILLED_SYRINGE | INTRAVITREAL | Status: AC | PRN
  Administered 2022-12-31: 6 mg via INTRAVITREAL

## 2023-01-10 ENCOUNTER — Inpatient Hospital Stay: Admission: RE | Admit: 2023-01-10 | Payer: Medicare (Managed Care) | Source: Ambulatory Visit

## 2023-01-14 NOTE — Progress Notes (Signed)
 [facility] - Clinic Note  01/28/2023     CHIEF COMPLAINT Patient presents for Retina Follow Up    HISTORY OF PRESENT ILLNESS: Mary [name] is a 77 y.o. female who presents to the clinic today for:   HPI     Retina Follow Up   Patient presents with  CRVO/BRVO.  In both eyes.  This started 4 weeks ago.  Duration of 4 weeks.  Since onset it is stable.  I, the attending physician,  performed the HPI with the patient and updated documentation appropriately.        Comments   4 week retina follow up CRVO OU and IVV OD pt is reporting her vision is not as sharp she has some floaters denies flashes pt is using  Brimonidine  TID OU and Timolol  BID OU pt has not used any drops for the past week her drops was taking when delivered       Last edited by Valdemar Rogue, MD on 01/28/2023  4:52 PM.     Pt state her vision is worse, she feels like it has been about 2 weeks since it started to decrease   Referring physician: No referring provider defined for this encounter.  HISTORICAL INFORMATION:   Selected notes from the MEDICAL RECORD NUMBER Transferred care from Dr. Alvia   CURRENT MEDICATIONS: Current Outpatient Medications (Ophthalmic Drugs)  Medication Sig   brimonidine  (ALPHAGAN ) 0.2 % ophthalmic solution INSTILL 1 DROP INTO BOTH EYES TWICE A DAY (Patient taking differently: Place 1 drop into both eyes 3 (three) times daily.)   dorzolamide -timolol  (COSOPT ) 22.3-6.8 MG/ML ophthalmic solution Place 1 drop into the right eye 2 (two) times daily. (Patient taking differently: Place 1 drop into both eyes 2 (two) times daily.)   timolol  (BETIMOL ) 0.25 % ophthalmic solution Place 1 drop into both eyes 2 (two) times daily.   No current facility-administered medications for this visit. (Ophthalmic Drugs)   Current Outpatient Medications (Other)  Medication Sig   amLODipine  (NORVASC ) 5 MG tablet Take 1 tablet (5 mg total) by mouth daily.   aspirin  81 MG  chewable tablet Chew 81 mg by mouth daily.   Assure Comfort Lancets 28G MISC    benzonatate  (TESSALON ) 200 MG capsule Take 1 capsule (200 mg total) by mouth 3 (three) times daily.   Blood Glucose Monitoring Suppl (ONETOUCH VERIO FLEX SYSTEM) w/Device KIT Use as directed to check blood sugars 2 times per day dx: e11.65   cetirizine  (ZYRTEC ) 10 MG tablet Take 1 tablet (10 mg total) by mouth daily.   Cholecalciferol (VITAMIN D) 50 MCG (2000 UT) CAPS Take 2,000 Units by mouth daily at 6 (six) AM.   Emollient (BAG BALM) OINT Apply 1 Application topically 4 (four) times daily as needed (itching).   fluticasone  (FLONASE ) 50 MCG/ACT nasal spray SPRAY 1 SPRAY INTO EACH NOSTRIL EVERY DAY (Patient taking differently: Place 1 spray into both nostrils daily.)   furosemide  (LASIX ) 40 MG tablet TAKE 1 TABLET BY MOUTH  DAILY   glucose blood (ONETOUCH VERIO) test strip Use as directed to check blood sugars 2 times per day dx: e11.65   HYDROcodone -acetaminophen  (NORCO) 10-325 MG tablet Take 1 tablet by mouth 5 (five) times daily as needed for moderate pain.   Ipratropium-Albuterol  (COMBIVENT ) 20-100 MCG/ACT AERS respimat Inhale 1 puff into the lungs 2 (two) times daily.   levothyroxine  (SYNTHROID ) 125 MCG tablet Take 125 mcg by mouth daily before breakfast.   loperamide (IMODIUM) 2 MG capsule Take  2 mg by mouth every 6 (six) hours as needed for diarrhea or loose stools.   metoprolol  succinate (TOPROL -XL) 50 MG 24 hr tablet TAKE 1 TABLET BY MOUTH  DAILY (Patient taking differently: Take 50 mg by mouth daily.)   montelukast  (SINGULAIR ) 10 MG tablet Take 10 mg by mouth at bedtime.   nystatin cream (MYCOSTATIN) Apply 1 Application topically 2 (two) times daily as needed for dry skin.   omeprazole  (PRILOSEC) 20 MG capsule TAKE 1 CAPSULE BY MOUTH  DAILY (Patient taking differently: Take 20 mg by mouth daily.)   OneTouch Delica Lancets 33G MISC Use as directed to check blood sugars 2 times per day dx: e11.65   pravastatin   (PRAVACHOL ) 40 MG tablet TAKE 1 TABLET BY MOUTH EVERY DAY (Patient taking differently: Take 80 mg by mouth daily.)   telmisartan  (MICARDIS ) 80 MG tablet TAKE 1 TABLET BY MOUTH  DAILY (Patient taking differently: Take 80 mg by mouth daily.)   triamcinolone  (KENALOG ) 0.1 % APPLY TO AFFECTED AREA TWICE A DAY (Patient taking differently: Apply 1 Application topically 2 (two) times daily as needed (rash).)   No current facility-administered medications for this visit. (Other)   REVIEW OF SYSTEMS: ROS   Positive for: Skin, Endocrine, Cardiovascular, Eyes Negative for: Constitutional, Gastrointestinal, Neurological, Genitourinary, Musculoskeletal, HENT, Respiratory, Psychiatric, Allergic/Imm, Heme/Lymph Last edited by Resa Delon ORN, COT on 01/28/2023  9:19 AM.     ALLERGIES No Known Allergies  PAST MEDICAL HISTORY Past Medical History:  Diagnosis Date   Arthritis    osteoarthritis   Cataract    OS   Diabetes mellitus    Insulin  dependent   Diabetic retinopathy (HCC)    NPDR OU   Glaucoma    POAG OD   H/O cardiovascular stress test    pt. reports 10/13- was normal per pt.    Hyperlipemia    Hypertension    Hypertensive retinopathy    OU   Past Surgical History:  Procedure Laterality Date   CARPAL TUNNEL RELEASE Bilateral    CATARACT EXTRACTION Right 10/05/2018   Dr. Lelon   EYE SURGERY Right    Cat Sx   KNEE ARTHROSCOPY     right   QUADRICEPS TENDON REPAIR  02/17/2012   Procedure: REPAIR QUADRICEP TENDON;  Surgeon: Marcey Raman, MD;  Location: [facility] OR;  Service: Orthopedics;  Laterality: Right;  right quadriceps repair with latera release   QUADRICEPS TENDON REPAIR Right 05/15/2012   Procedure: REPAIR QUADRICEP TENDON;  Surgeon: Marcey Raman, MD;  Location: [facility] OR;  Service: Orthopedics;  Laterality: Right;   TOTAL KNEE ARTHROPLASTY  10/21/2011   Procedure: TOTAL KNEE ARTHROPLASTY;  Surgeon: Garnette JONETTA Raman, MD;  Location: [facility] OR;  Service: Orthopedics;  Laterality:  Right;   TUBAL LIGATION     FAMILY HISTORY Family History  Problem Relation Age of Onset   Hypertension Mother    Heart Problems Father    Gout Father    Arthritis Father    SOCIAL HISTORY Social History   Tobacco Use   Smoking status: Former    Current packs/day: 0.00    Average packs/day: 1 pack/day for 47.5 years (47.5 ttl pk-yrs)    Types: Cigarettes    Start date: 01/15/1967    Quit date: 06/28/2014    Years since quitting: 8.5   Smokeless tobacco: Never  Vaping Use   Vaping status: Never Used  Substance Use Topics   Alcohol  use: No   Drug use: Yes    Types: Hydrocodone   OPHTHALMIC EXAM: Base Eye Exam     Visual Acuity (Snellen - Linear)       Right Left   Dist [place] CF at 3' HM    Correction: Glasses         Tonometry (Tonopen, 9:26 AM)       Right Left   Pressure 16 19         Pupils       Pupils Dark Light Shape React APD   Right PERRL 2 1 Round Brisk None   Left PERRL 2 2 Round Brisk None         Visual Fields       Left Right   Restrictions Total superior temporal, inferior temporal, superior nasal, inferior nasal deficiencies          Extraocular Movement       Right Left    Full, Ortho Full, Ortho         Neuro/Psych     Oriented x3: Yes   Mood/Affect: Normal         Dilation     Both eyes: 2.5% Phenylephrine  @ 9:26 AM           Slit Lamp and Fundus Exam     Slit Lamp Exam       Right Left   Lids/Lashes Dermatochalasis - upper lid, mild Meibomian gland dysfunction Dermatochalasis - upper lid, mild Meibomian gland dysfunction   Conjunctiva/Sclera Melanosis Melanosis   Cornea Trace, Punctate epithelial erosions, well healed temporal cataract wounds, arcus, trace EBMD Mild arcus, trace PEE   Anterior Chamber Deep and quiet Deep and quiet   Iris Round and poorly dilated to 3.18mm Round and moderately dilated to 6mm   Lens PC IOL in good position, trace, cortical remnant temporally, PC folds 3+ Nuclear  sclerosis with brunescense, 3+ Cortical cataract   Anterior Vitreous Vitreous syneresis, Ozurdex  pellet remnants inferiorly, mild blood stained vitreous condensations -- now white -- slightly improved Vitreous syneresis         Fundus Exam       Right Left   Disc 2-3+Pallor, Sharp rim, temporal PPP/PPA, thin superior rim, +cupping, vascular loops, no heme Mild Pallor, Sharp rim, PPP, severely attenuated vessels   C/D Ratio 0.9 0.3   Macula Blunted foveal reflex, Drusen, Retinal pigment epithelial mottling, punctate exudates - improved, scattered DBH greatest inferior macula -- improved, mild interval increase in edema Flat, Blunted foveal reflex, RPE mottling and clumping, scarring, atrophy, no heme   Vessels attenuated, Tortuous Severe Vascular attenuation, +fibrosis   Periphery Attached, scattered DBH Attached, pavingstone and reticular degeneration inferiorly, scattered RPE atrophy, greatest peripapillary           Refraction     Wearing Rx       Sphere Cylinder Axis Add   Right -1.00 +0.75 180 +2.25   Left -0.50 +0.75 155 +2.25           IMAGING AND PROCEDURES  Imaging and Procedures for @TODAY @  OCT, Retina - OU - Both Eyes       Right Eye Quality was good. Central Foveal Thickness: 234. Progression has worsened. Findings include no SRF, abnormal foveal contour, intraretinal hyper-reflective material, intraretinal fluid, vitreomacular adhesion (interval increase in IRF/IRHM temporal and inferior mac and fovea).   Left Eye Quality was borderline. Central Foveal Thickness: 242. Progression has been stable. Findings include no SRF, abnormal foveal contour, subretinal hyper-reflective material, epiretinal membrane, intraretinal fluid, pigment epithelial detachment, outer retinal atrophy, preretinal fibrosis (  Stable improvement in scattered cystic changes, persistent central ORA / SRHM).   Notes *Images captured and stored on drive  Diagnosis / Impression:  OD:  interval increase in IRF/IRHM temporal and inferior mac and fovea OS: stable improvement in scattered cystic changes, persistent central ORA / SRHM  Clinical management:  See below  Abbreviations: NFP - Normal foveal profile. CME - cystoid macular edema. PED - pigment epithelial detachment. IRF - intraretinal fluid. SRF - subretinal fluid. EZ - ellipsoid zone. ERM - epiretinal membrane. ORA - outer retinal atrophy. ORT - outer retinal tubulation. SRHM - subretinal hyper-reflective material      Intravitreal Injection, Pharmacologic Agent - OD - Right Eye       Time Out 01/28/2023. 10:49 AM. Confirmed correct patient, procedure, site, and patient consented.   Anesthesia Topical anesthesia was used. Anesthetic medications included Lidocaine  2%, Proparacaine 0.5%.   Procedure Preparation included 5% betadine to ocular surface, eyelid speculum. A (32g) needle was used.   Injection: 6 mg faricimab -svoa 6 MG/0.05ML   Route: Intravitreal, Site: Right Eye   NDC: 49757-903-93, Lot: [id number], Expiration date: 05/13/2024, Waste: 0 mL   Post-op Post injection exam found visual acuity of at least counting fingers, no retinal detachment, perfused optic nerve. The patient tolerated the procedure well. There were no complications. The patient received written and verbal post procedure care education.   Notes An AC tap was performed following injection due to elevated IOP using a 30 gauge needle on a syringe with the plunger removed. The needle was placed at the limbus at 57 oclock and approximately 0.12cc of aqueous was removed from the anterior chamber. Betadine was applied to the tap area before and after the paracentesis was performed. There were no complications. The patient tolerated the procedure well. The IOP was rechecked and was found to be ~8 mmHg by digital palpation.            ASSESSMENT/PLAN:    ICD-10-CM   1. Central retinal vein occlusion with macular edema of both eyes   H34.8130 OCT, Retina - OU - Both Eyes    Intravitreal Injection, Pharmacologic Agent - OD - Right Eye    faricimab -svoa (VABYSMO ) 6mg /0.66mL intravitreal injection    2. Severe nonproliferative diabetic retinopathy of both eyes with macular edema associated with type 2 diabetes mellitus (HCC)  Z88.6586     3. Long-term (current) use of injectable non-insulin  antidiabetic drugs  Z79.85     4. Essential hypertension  I10     5. Hypertensive retinopathy of both eyes  H35.033     6. Combined forms of age-related cataract of left eye  H25.812     7. Pseudophakia  Z96.1     8. Primary open angle glaucoma (POAG) of right eye, mild stage  H40.1111      1. CRVO w/ CME OU  - pt of Dr. Alvia who wished to transfer care - OS long-standing low vision (CF) with subfoveal scar and central retinal atrophy - OD with CME that was actively being managed with Ozurdex  OD ~q5 wks by Dr. Alvia (last on 8.20.20) s/p multiple IVTA and Ozurdex  OD - was due for Ozurdex  OD w/ Alvia on 9.25.20, but underwent cataract surgery OD w/ Lelon on 9.21.20 - s/p IVTA OD #4 (10.26.20), #5 (12.10.20), #6 (01.21.21), #7 (03.05.21), #8 (04.16.21), #9 (05.28.21), #10 (07.09.21), #11 (09.14.21), #12 (10.29.21), #13 (12.17.21), #14 (01.27.22), #15 (03.11.2022), #16 (05.04.22), #17 (06.17.22), #18 (07.29.22), #19 (09.16.22), #20 (10.28.22), #21 (12.16.22), #22 (02.03.23) #23 (04.07.23), #  24 (06.14.23) - s/p IVA OD #1 (07.17.23), #2 (08.14.23), #3 (09.14.23), #4 (10.18.23) #5 (12.01.24) - s/p IVE OD #1 (01.26.24), #2 (02.23.24), #3 (03.29.24), #4 (04.26.24), #5 (05.24.24) - s/p IVV OD #1 (07.03.24), #2 (08.30.24), #3 (10.15.24), #4 (11.15.24), #5 (12.17.24)  - BCVA OD CF from 20/250  - exam shows stable improvement in blood stained vit condensations - OCT shows OD: Mild Interval increase in IRF/IRHM greatest inferior mac and fovea; OS: stable improvement in scattered cystic changes, persistent central ORA / SRHM at 4  weeks - discussed potential benefit of switching medication  - recommend IVV OD #6 today, 1.14.25, w/ f/u in 4 wks  - pt wishes to proceed with injection  - RBA of procedure discussed, questions answered - see procedure note - IVV informed consent obtained and signed, 07.03.24 (OD) - PACE approved Vabysmo  OD  - f/u 4 weeks -- DFE/OCT/possible injection  2,3. Severe Non-proliferative diabetic retinopathy OU.  - OCT shows diabetic macular edema, OD -- slightly increased  - s/p IVTA OD on 06.14.23 as above  - s/p IVA OD #1 on 07.17.23 as above for DME as well as CRVO  - IVV OD #6 today as above  4,5. Hypertensive retinopathy OU  - discussed importance of tight BP control  - continue to monitor  6. Mixed form age related cataract OS  - under the expert management of [facility]  7. Pseudophakia OD  - s/p CE/IOL OD (9.21.20) by expert surgeon, Dr. Lelon  - IOL in excellent position  - had post op IOP spike -- IOP now under better control  - continue to monitor  8. POAG OD  - now under the expert management of Dr. Geneva (initial consult 07.27.23)  - s/p SLT and goniotomy OD w/ Dr. Lelon  - had post-cataract IOP spike  - on Brimonidine  TID OU and Timolol  BID OU -- no longer using Cosopt   - IOP 16 today -- AC tap performed post injection  Return in about 4 weeks (around 02/25/2023) for f/u CRVO OU, DFE, OCT.   Ophthalmic Meds Ordered this visit:  Meds ordered this encounter  Medications   faricimab -svoa (VABYSMO ) 6mg /0.51mL intravitreal injection    This document serves as a record of services personally performed by Redell JUDITHANN Hans, MD, PhD. It was created on their behalf by Wanda GEANNIE Keens, COT an ophthalmic technician. The creation of this record is the provider's dictation and/or activities during the visit.    Electronically signed by:  Wanda GEANNIE Keens, COT  01/30/23 1:46 AM  This document serves as a record of services personally performed by Redell JUDITHANN Hans,  MD, PhD. It was created on their behalf by Alan PARAS. Delores, OA an ophthalmic technician. The creation of this record is the provider's dictation and/or activities during the visit.    Electronically signed by: Alan PARAS. Delores, OA 01/30/23 1:46 AM  Redell JUDITHANN Hans, M.D., Ph.D. Diseases & Surgery of the Retina and Vitreous [facility]  I have reviewed the above documentation for accuracy and completeness, and I agree with the above. Redell JUDITHANN Hans, M.D., Ph.D. 01/30/23 1:48 AM  Abbreviations: M myopia (nearsighted); A astigmatism; H hyperopia (farsighted); P presbyopia; Mrx spectacle prescription;  CTL contact lenses; OD right eye; OS left eye; OU both eyes  XT exotropia; ET esotropia; PEK punctate epithelial keratitis; PEE punctate epithelial erosions; DES dry eye syndrome; MGD meibomian gland dysfunction; ATs artificial tears; PFAT's preservative free artificial tears; NSC nuclear sclerotic  cataract; PSC posterior subcapsular cataract; ERM epi-retinal membrane; PVD posterior vitreous detachment; RD retinal detachment; DM diabetes mellitus; DR diabetic retinopathy; NPDR non-proliferative diabetic retinopathy; PDR proliferative diabetic retinopathy; CSME clinically significant macular edema; DME diabetic macular edema; dbh dot blot hemorrhages; CWS cotton wool spot; POAG primary open angle glaucoma; C/D cup-to-disc ratio; HVF humphrey visual field; GVF goldmann visual field; OCT optical coherence tomography; IOP intraocular pressure; BRVO Branch retinal vein occlusion; CRVO central retinal vein occlusion; CRAO central retinal artery occlusion; BRAO branch retinal artery occlusion; RT retinal tear; SB scleral buckle; PPV pars plana vitrectomy; VH Vitreous hemorrhage; PRP panretinal laser photocoagulation; IVK intravitreal kenalog ; VMT vitreomacular traction; MH Macular hole;  NVD neovascularization of the disc; NVE neovascularization elsewhere; AREDS age related eye disease study;  ARMD age related macular degeneration; POAG primary open angle glaucoma; EBMD epithelial/anterior basement membrane dystrophy; ACIOL anterior chamber intraocular lens; IOL intraocular lens; PCIOL posterior chamber intraocular lens; Phaco/IOL phacoemulsification with intraocular lens placement; PRK photorefractive keratectomy; LASIK laser assisted in situ keratomileusis; HTN hypertension; DM diabetes mellitus; COPD chronic obstructive pulmonary disease

## 2023-01-24 ENCOUNTER — Other Ambulatory Visit (INDEPENDENT_AMBULATORY_CARE_PROVIDER_SITE_OTHER): Payer: Self-pay

## 2023-01-24 DIAGNOSIS — H401111 Primary open-angle glaucoma, right eye, mild stage: Secondary | ICD-10-CM

## 2023-01-28 ENCOUNTER — Encounter (INDEPENDENT_AMBULATORY_CARE_PROVIDER_SITE_OTHER): Payer: Self-pay | Admitting: Ophthalmology

## 2023-01-28 ENCOUNTER — Ambulatory Visit (INDEPENDENT_AMBULATORY_CARE_PROVIDER_SITE_OTHER): Payer: Medicare (Managed Care) | Admitting: Ophthalmology

## 2023-01-28 DIAGNOSIS — Z961 Presence of intraocular lens: Secondary | ICD-10-CM

## 2023-01-28 DIAGNOSIS — H34813 Central retinal vein occlusion, bilateral, with macular edema: Secondary | ICD-10-CM | POA: Diagnosis not present

## 2023-01-28 DIAGNOSIS — E113413 Type 2 diabetes mellitus with severe nonproliferative diabetic retinopathy with macular edema, bilateral: Secondary | ICD-10-CM

## 2023-01-28 DIAGNOSIS — H25812 Combined forms of age-related cataract, left eye: Secondary | ICD-10-CM

## 2023-01-28 DIAGNOSIS — Z7985 Long-term (current) use of injectable non-insulin antidiabetic drugs: Secondary | ICD-10-CM | POA: Diagnosis not present

## 2023-01-28 DIAGNOSIS — H401111 Primary open-angle glaucoma, right eye, mild stage: Secondary | ICD-10-CM

## 2023-01-28 DIAGNOSIS — I10 Essential (primary) hypertension: Secondary | ICD-10-CM | POA: Diagnosis not present

## 2023-01-28 DIAGNOSIS — H35033 Hypertensive retinopathy, bilateral: Secondary | ICD-10-CM

## 2023-01-28 MED ORDER — FARICIMAB-SVOA 6 MG/0.05ML IZ SOSY
6.0000 mg | PREFILLED_SYRINGE | INTRAVITREAL | Status: AC | PRN
  Administered 2023-01-28: 6 mg via INTRAVITREAL

## 2023-02-26 NOTE — Progress Notes (Shared)
[facility] - Clinic Note  02/28/2023     CHIEF COMPLAINT Patient presents for No chief complaint on file.    HISTORY OF PRESENT ILLNESS: Mary [name] is a 78 y.o. female who presents to the clinic today for:     Pt state her vision is worse, she feels like it has been about 2 weeks since it started to decrease   Referring physician: No referring provider defined for this encounter.  HISTORICAL INFORMATION:   Selected notes from the MEDICAL RECORD NUMBER Transferred care from Dr. Ashley Royalty   CURRENT MEDICATIONS: Current Outpatient Medications (Ophthalmic Drugs)  Medication Sig   brimonidine (ALPHAGAN) 0.2 % ophthalmic solution INSTILL 1 DROP INTO BOTH EYES TWICE A DAY (Patient taking differently: Place 1 drop into both eyes 3 (three) times daily.)   dorzolamide-timolol (COSOPT) 22.3-6.8 MG/ML ophthalmic solution Place 1 drop into the right eye 2 (two) times daily. (Patient taking differently: Place 1 drop into both eyes 2 (two) times daily.)   timolol (BETIMOL) 0.25 % ophthalmic solution Place 1 drop into both eyes 2 (two) times daily.   No current facility-administered medications for this visit. (Ophthalmic Drugs)   Current Outpatient Medications (Other)  Medication Sig   amLODipine (NORVASC) 5 MG tablet Take 1 tablet (5 mg total) by mouth daily.   aspirin 81 MG chewable tablet Chew 81 mg by mouth daily.   Assure Comfort Lancets 28G MISC    benzonatate (TESSALON) 200 MG capsule Take 1 capsule (200 mg total) by mouth 3 (three) times daily.   Blood Glucose Monitoring Suppl (ONETOUCH VERIO FLEX SYSTEM) w/Device KIT Use as directed to check blood sugars 2 times per day dx: e11.65   cetirizine (ZYRTEC) 10 MG tablet Take 1 tablet (10 mg total) by mouth daily.   Cholecalciferol (VITAMIN D) 50 MCG (2000 UT) CAPS Take 2,000 Units by mouth daily at 6 (six) AM.   Emollient (BAG BALM) OINT Apply 1 Application topically 4 (four) times daily as needed (itching).    fluticasone (FLONASE) 50 MCG/ACT nasal spray SPRAY 1 SPRAY INTO EACH NOSTRIL EVERY DAY (Patient taking differently: Place 1 spray into both nostrils daily.)   furosemide (LASIX) 40 MG tablet TAKE 1 TABLET BY MOUTH  DAILY   glucose blood (ONETOUCH VERIO) test strip Use as directed to check blood sugars 2 times per day dx: e11.65   HYDROcodone-acetaminophen (NORCO) 10-325 MG tablet Take 1 tablet by mouth 5 (five) times daily as needed for moderate pain.   Ipratropium-Albuterol (COMBIVENT) 20-100 MCG/ACT AERS respimat Inhale 1 puff into the lungs 2 (two) times daily.   levothyroxine (SYNTHROID) 125 MCG tablet Take 125 mcg by mouth daily before breakfast.   loperamide (IMODIUM) 2 MG capsule Take 2 mg by mouth every 6 (six) hours as needed for diarrhea or loose stools.   metoprolol succinate (TOPROL-XL) 50 MG 24 hr tablet TAKE 1 TABLET BY MOUTH  DAILY (Patient taking differently: Take 50 mg by mouth daily.)   montelukast (SINGULAIR) 10 MG tablet Take 10 mg by mouth at bedtime.   nystatin cream (MYCOSTATIN) Apply 1 Application topically 2 (two) times daily as needed for dry skin.   omeprazole (PRILOSEC) 20 MG capsule TAKE 1 CAPSULE BY MOUTH  DAILY (Patient taking differently: Take 20 mg by mouth daily.)   OneTouch Delica Lancets 33G MISC Use as directed to check blood sugars 2 times per day dx: e11.65   pravastatin (PRAVACHOL) 40 MG tablet TAKE 1 TABLET BY MOUTH EVERY DAY (Patient  taking differently: Take 80 mg by mouth daily.)   telmisartan (MICARDIS) 80 MG tablet TAKE 1 TABLET BY MOUTH  DAILY (Patient taking differently: Take 80 mg by mouth daily.)   triamcinolone (KENALOG) 0.1 % APPLY TO AFFECTED AREA TWICE A DAY (Patient taking differently: Apply 1 Application topically 2 (two) times daily as needed (rash).)   No current facility-administered medications for this visit. (Other)   REVIEW OF SYSTEMS:   ALLERGIES No Known Allergies  PAST MEDICAL HISTORY Past Medical History:  Diagnosis Date    Arthritis    osteoarthritis   Cataract    OS   Diabetes mellitus    Insulin dependent   Diabetic retinopathy (HCC)    NPDR OU   Glaucoma    POAG OD   H/O cardiovascular stress test    pt. reports 10/13- was normal per pt.    Hyperlipemia    Hypertension    Hypertensive retinopathy    OU   Past Surgical History:  Procedure Laterality Date   CARPAL TUNNEL RELEASE Bilateral    CATARACT EXTRACTION Right 10/05/2018   Dr. Alben Spittle   EYE SURGERY Right    Cat Sx   KNEE ARTHROSCOPY     right   QUADRICEPS TENDON REPAIR  02/17/2012   Procedure: REPAIR QUADRICEP TENDON;  Surgeon: Dannielle Huh, MD;  Location: [facility] OR;  Service: Orthopedics;  Laterality: Right;  right quadriceps repair with latera release   QUADRICEPS TENDON REPAIR Right 05/15/2012   Procedure: REPAIR QUADRICEP TENDON;  Surgeon: Dannielle Huh, MD;  Location: [facility] OR;  Service: Orthopedics;  Laterality: Right;   TOTAL KNEE ARTHROPLASTY  10/21/2011   Procedure: TOTAL KNEE ARTHROPLASTY;  Surgeon: Raymon Mutton, MD;  Location: [facility] OR;  Service: Orthopedics;  Laterality: Right;   TUBAL LIGATION     FAMILY HISTORY Family History  Problem Relation Age of Onset   Hypertension Mother    Heart Problems Father    Gout Father    Arthritis Father    SOCIAL HISTORY Social History   Tobacco Use   Smoking status: Former    Current packs/day: 0.00    Average packs/day: 1 pack/day for 47.5 years (47.5 ttl pk-yrs)    Types: Cigarettes    Start date: 01/15/1967    Quit date: 06/28/2014    Years since quitting: 8.6   Smokeless tobacco: Never  Vaping Use   Vaping status: Never Used  Substance Use Topics   Alcohol use: No   Drug use: Yes    Types: Hydrocodone       OPHTHALMIC EXAM: Not recorded    IMAGING AND PROCEDURES  Imaging and Procedures for @TODAY @          ASSESSMENT/PLAN:  No diagnosis found.  1. CRVO w/ CME OU  - pt of Dr. Ashley Royalty who wished to transfer care - OS long-standing low vision (CF) with  subfoveal scar and central retinal atrophy - OD with CME that was actively being managed with Ozurdex OD ~q5 wks by Dr. Ashley Royalty (last on 8.20.20) s/p multiple IVTA and Ozurdex OD - was due for Ozurdex OD w/ Ashley Royalty on 9.25.20, but underwent cataract surgery OD w/ Alben Spittle on 9.21.20 - s/p IVTA OD #4 (10.26.20), #5 (12.10.20), #6 (01.21.21), #7 (03.05.21), #8 (04.16.21), #9 (05.28.21), #10 (07.09.21), #11 (09.14.21), #12 (10.29.21), #13 (12.17.21), #14 (01.27.22), #15 (03.11.2022), #16 (05.04.22), #17 (06.17.22), #18 (07.29.22), #19 (09.16.22), #20 (10.28.22), #21 (12.16.22), #22 (02.03.23) #23 (04.07.23), #24 (06.14.23) - s/p IVA OD #1 (07.17.23), #2 (08.14.23), #3 (09.14.23), #4 (  10.18.23) #5 (12.01.24) - s/p IVE OD #1 (01.26.24), #2 (02.23.24), #3 (03.29.24), #4 (04.26.24), #5 (05.24.24) - s/p IVV OD #1 (07.03.24), #2 (08.30.24), #3 (10.15.24), #4 (11.15.24), #5 (12.17.24) #6(01.14.25)  - BCVA OD CF from 20/250  - exam shows stable improvement in blood stained vit condensations - OCT shows OD: Mild Interval increase in IRF/IRHM greatest inferior mac and fovea; OS: stable improvement in scattered cystic changes, persistent central ORA / SRHM at 4 weeks - discussed potential benefit of switching medication  - recommend IVV OD #7 today, 2.14.25, w/ f/u in 4 wks  - pt wishes to proceed with injection  - RBA of procedure discussed, questions answered - see procedure note - IVV informed consent obtained and signed, 07.03.24 (OD) - PACE approved Vabysmo OD  - f/u 4 weeks -- DFE/OCT/possible injection  2,3. Severe Non-proliferative diabetic retinopathy OU.  - OCT shows diabetic macular edema, OD -- slightly increased  - s/p IVTA OD on 06.14.23 as above  - s/p IVA OD #1 on 07.17.23 as above for DME as well as CRVO  - IVV OD #6 today as above  4,5. Hypertensive retinopathy OU  - discussed importance of tight BP control  - continue to monitor  6. 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 Pseudophakia OD  - s/p CE/IOL OD (9.21.20) by expert surgeon, Dr. Alben Spittle  - IOL in excellent position  - had post op IOP spike -- IOP now under better control  - continue to monitor  8. POAG OD  - now under the expert management of Dr. Lottie Dawson (initial consult 07.27.23)  - s/p SLT and goniotomy OD w/ Dr. Alben Spittle  - had post-cataract IOP spike  - on Brimonidine TID OU and Timolol BID OU -- no longer using Cosopt  - IOP 16 today -- AC tap performed post injection  No follow-ups on file.   Ophthalmic Meds Ordered this visit:  No orders of the defined types were placed in this encounter.   This document serves as a record of services personally performed by Karie Chimera, MD, PhD. It was created on their behalf by Berlin Hun COT, an ophthalmic technician. The creation of this record is the provider's dictation and/or activities during the visit.    Electronically signed by: Berlin Hun COT 02.14.25  10:10 AM  Abbreviations: M myopia (nearsighted); A astigmatism; H hyperopia (farsighted); P presbyopia; Mrx spectacle prescription;  CTL contact lenses; OD right eye; OS left eye; OU both eyes  XT exotropia; ET esotropia; PEK punctate epithelial keratitis; PEE punctate epithelial erosions; DES dry eye syndrome; MGD meibomian gland dysfunction; ATs artificial tears; PFAT's preservative free artificial tears; NSC nuclear sclerotic cataract; PSC posterior subcapsular cataract; ERM epi-retinal membrane; PVD posterior vitreous detachment; RD retinal detachment; DM diabetes mellitus; DR diabetic retinopathy; NPDR non-proliferative diabetic retinopathy; PDR proliferative diabetic retinopathy; CSME clinically significant macular edema; DME diabetic macular edema; dbh dot blot hemorrhages; CWS cotton wool spot; POAG primary open angle glaucoma; C/D cup-to-disc ratio; HVF humphrey visual field; GVF goldmann visual field; OCT optical coherence tomography;  IOP intraocular pressure; BRVO Branch retinal vein occlusion; CRVO central retinal vein occlusion; CRAO central retinal artery occlusion; BRAO branch retinal artery occlusion; RT retinal tear; SB scleral buckle; PPV pars plana vitrectomy; VH Vitreous hemorrhage; PRP panretinal laser photocoagulation; IVK intravitreal kenalog; VMT vitreomacular traction; MH Macular hole;  NVD neovascularization of the disc; NVE neovascularization elsewhere; AREDS age related eye disease study;  ARMD age related macular degeneration; POAG primary open angle glaucoma; EBMD epithelial/anterior basement membrane dystrophy; ACIOL anterior chamber intraocular lens; IOL intraocular lens; PCIOL posterior chamber intraocular lens; Phaco/IOL phacoemulsification with intraocular lens placement; PRK photorefractive keratectomy; LASIK laser assisted in situ keratomileusis; HTN hypertension; DM diabetes mellitus; COPD chronic obstructive pulmonary disease

## 2023-02-28 ENCOUNTER — Encounter (INDEPENDENT_AMBULATORY_CARE_PROVIDER_SITE_OTHER): Payer: Medicare (Managed Care) | Admitting: Ophthalmology

## 2023-02-28 DIAGNOSIS — H35033 Hypertensive retinopathy, bilateral: Secondary | ICD-10-CM

## 2023-02-28 DIAGNOSIS — H25812 Combined forms of age-related cataract, left eye: Secondary | ICD-10-CM

## 2023-02-28 DIAGNOSIS — I10 Essential (primary) hypertension: Secondary | ICD-10-CM

## 2023-02-28 DIAGNOSIS — E113413 Type 2 diabetes mellitus with severe nonproliferative diabetic retinopathy with macular edema, bilateral: Secondary | ICD-10-CM

## 2023-02-28 DIAGNOSIS — H401111 Primary open-angle glaucoma, right eye, mild stage: Secondary | ICD-10-CM

## 2023-02-28 DIAGNOSIS — H34813 Central retinal vein occlusion, bilateral, with macular edema: Secondary | ICD-10-CM

## 2023-02-28 DIAGNOSIS — Z961 Presence of intraocular lens: Secondary | ICD-10-CM

## 2023-02-28 DIAGNOSIS — Z7985 Long-term (current) use of injectable non-insulin antidiabetic drugs: Secondary | ICD-10-CM

## 2023-04-02 NOTE — Progress Notes (Signed)
 [facility] - Clinic Note  04/04/2023     CHIEF COMPLAINT Patient presents for Retina Follow Up    HISTORY OF PRESENT ILLNESS: Mary [name] is a 78 y.o. female who presents to the clinic today for:   HPI     Retina Follow Up   Patient presents with  CRVO/BRVO.  In both eyes.  Severity is moderate.  Duration of 9.5 weeks.  Since onset it is stable.  I, the attending physician,  performed the HPI with the patient and updated documentation appropriately.        Comments   Patient states vision the same OU.      Last edited by Rennis Chris, MD on 04/04/2023  4:52 PM.    Pt is delayed to follow up from 4 weeks to 9 weeks, she states her vision is "gone"   Referring physician: No referring provider defined for this encounter.  HISTORICAL INFORMATION:   Selected notes from the MEDICAL RECORD NUMBER Transferred care from Dr. Ashley Royalty   CURRENT MEDICATIONS: Current Outpatient Medications (Ophthalmic Drugs)  Medication Sig   brimonidine (ALPHAGAN) 0.2 % ophthalmic solution INSTILL 1 DROP INTO BOTH EYES TWICE A DAY (Patient taking differently: Place 1 drop into both eyes 3 (three) times daily.)   dorzolamide-timolol (COSOPT) 22.3-6.8 MG/ML ophthalmic solution Place 1 drop into the right eye 2 (two) times daily. (Patient taking differently: Place 1 drop into both eyes 2 (two) times daily.)   timolol (BETIMOL) 0.25 % ophthalmic solution Place 1 drop into both eyes 2 (two) times daily.   No current facility-administered medications for this visit. (Ophthalmic Drugs)   Current Outpatient Medications (Other)  Medication Sig   amLODipine (NORVASC) 5 MG tablet Take 1 tablet (5 mg total) by mouth daily.   aspirin 81 MG chewable tablet Chew 81 mg by mouth daily.   Assure Comfort Lancets 28G MISC    benzonatate (TESSALON) 200 MG capsule Take 1 capsule (200 mg total) by mouth 3 (three) times daily.   Blood Glucose Monitoring Suppl (ONETOUCH VERIO FLEX SYSTEM)  w/Device KIT Use as directed to check blood sugars 2 times per day dx: e11.65   cetirizine (ZYRTEC) 10 MG tablet Take 1 tablet (10 mg total) by mouth daily.   Cholecalciferol (VITAMIN D) 50 MCG (2000 UT) CAPS Take 2,000 Units by mouth daily at 6 (six) AM.   Emollient (BAG BALM) OINT Apply 1 Application topically 4 (four) times daily as needed (itching).   fluticasone (FLONASE) 50 MCG/ACT nasal spray SPRAY 1 SPRAY INTO EACH NOSTRIL EVERY DAY (Patient taking differently: Place 1 spray into both nostrils daily.)   furosemide (LASIX) 40 MG tablet TAKE 1 TABLET BY MOUTH  DAILY   glucose blood (ONETOUCH VERIO) test strip Use as directed to check blood sugars 2 times per day dx: e11.65   HYDROcodone-acetaminophen (NORCO) 10-325 MG tablet Take 1 tablet by mouth 5 (five) times daily as needed for moderate pain.   Ipratropium-Albuterol (COMBIVENT) 20-100 MCG/ACT AERS respimat Inhale 1 puff into the lungs 2 (two) times daily.   levothyroxine (SYNTHROID) 125 MCG tablet Take 125 mcg by mouth daily before breakfast.   loperamide (IMODIUM) 2 MG capsule Take 2 mg by mouth every 6 (six) hours as needed for diarrhea or loose stools.   metoprolol succinate (TOPROL-XL) 50 MG 24 hr tablet TAKE 1 TABLET BY MOUTH  DAILY (Patient taking differently: Take 50 mg by mouth daily.)   montelukast (SINGULAIR) 10 MG tablet Take 10 mg by  mouth at bedtime.   nystatin cream (MYCOSTATIN) Apply 1 Application topically 2 (two) times daily as needed for dry skin.   omeprazole (PRILOSEC) 20 MG capsule TAKE 1 CAPSULE BY MOUTH  DAILY (Patient taking differently: Take 20 mg by mouth daily.)   OneTouch Delica Lancets 33G MISC Use as directed to check blood sugars 2 times per day dx: e11.65   pravastatin (PRAVACHOL) 40 MG tablet TAKE 1 TABLET BY MOUTH EVERY DAY (Patient taking differently: Take 80 mg by mouth daily.)   telmisartan (MICARDIS) 80 MG tablet TAKE 1 TABLET BY MOUTH  DAILY (Patient taking differently: Take 80 mg by mouth daily.)    triamcinolone (KENALOG) 0.1 % APPLY TO AFFECTED AREA TWICE A DAY (Patient taking differently: Apply 1 Application topically 2 (two) times daily as needed (rash).)   No current facility-administered medications for this visit. (Other)   REVIEW OF SYSTEMS: ROS   Positive for: Skin, Endocrine, Cardiovascular, Eyes Negative for: Constitutional, Gastrointestinal, Neurological, Genitourinary, Musculoskeletal, HENT, Respiratory, Psychiatric, Allergic/Imm, Heme/Lymph Last edited by Doreene Nest, COT on 04/04/2023  9:09 AM.      ALLERGIES No Known Allergies  PAST MEDICAL HISTORY Past Medical History:  Diagnosis Date   Arthritis    osteoarthritis   Cataract    OS   Diabetes mellitus    Insulin dependent   Diabetic retinopathy (HCC)    NPDR OU   Glaucoma    POAG OD   H/O cardiovascular stress test    pt. reports 10/13- was normal per pt.    Hyperlipemia    Hypertension    Hypertensive retinopathy    OU   Past Surgical History:  Procedure Laterality Date   CARPAL TUNNEL RELEASE Bilateral    CATARACT EXTRACTION Right 10/05/2018   Dr. Alben Spittle   EYE SURGERY Right    Cat Sx   KNEE ARTHROSCOPY     right   QUADRICEPS TENDON REPAIR  02/17/2012   Procedure: REPAIR QUADRICEP TENDON;  Surgeon: Dannielle Huh, MD;  Location: [facility] OR;  Service: Orthopedics;  Laterality: Right;  right quadriceps repair with latera release   QUADRICEPS TENDON REPAIR Right 05/15/2012   Procedure: REPAIR QUADRICEP TENDON;  Surgeon: Dannielle Huh, MD;  Location: [facility] OR;  Service: Orthopedics;  Laterality: Right;   TOTAL KNEE ARTHROPLASTY  10/21/2011   Procedure: TOTAL KNEE ARTHROPLASTY;  Surgeon: Raymon Mutton, MD;  Location: [facility] OR;  Service: Orthopedics;  Laterality: Right;   TUBAL LIGATION     FAMILY HISTORY Family History  Problem Relation Age of Onset   Hypertension Mother    Heart Problems Father    Gout Father    Arthritis Father    SOCIAL HISTORY Social History   Tobacco Use   Smoking status:  Former    Current packs/day: 0.00    Average packs/day: 1 pack/day for 47.5 years (47.5 ttl pk-yrs)    Types: Cigarettes    Start date: 01/15/1967    Quit date: 06/28/2014    Years since quitting: 8.7   Smokeless tobacco: Never  Vaping Use   Vaping status: Never Used  Substance Use Topics   Alcohol use: No   Drug use: Yes    Types: Hydrocodone       OPHTHALMIC EXAM: Base Eye Exam     Visual Acuity (Snellen - Linear)       Right Left   Dist [place] CF@3 ' CF@face          Tonometry (Tonopen, 9:11 AM)       Right  Left   Pressure 16 16         Pupils       Dark Light Shape React APD   Right 2 1 Round Slow None   Left 2 2 Round Minimal None         Visual Fields       Left Right   Restrictions Total superior temporal, inferior temporal, superior nasal, inferior nasal deficiencies          Extraocular Movement       Right Left    Full, Ortho Full, Ortho         Neuro/Psych     Oriented x3: Yes   Mood/Affect: Normal         Dilation     Both eyes: 1.0% Mydriacyl, 2.5% Phenylephrine @ 9:11 AM           Slit Lamp and Fundus Exam     Slit Lamp Exam       Right Left   Lids/Lashes Dermatochalasis - upper lid, mild Meibomian gland dysfunction Dermatochalasis - upper lid, mild Meibomian gland dysfunction   Conjunctiva/Sclera Melanosis Melanosis   Cornea Trace, Punctate epithelial erosions, well healed temporal cataract wounds, arcus, trace EBMD, Descemet's folds Mild arcus, trace PEE   Anterior Chamber Deep and quiet Deep and quiet   Iris Round and poorly dilated to 3.34mm Round and moderately dilated to 6mm   Lens PC IOL in good position, trace, cortical remnant temporally, PC folds 3+ Nuclear sclerosis with brunescense, 3+ Cortical cataract   Anterior Vitreous Vitreous syneresis, mild blood stained vitreous condensations -- now white and fading Vitreous syneresis         Fundus Exam       Right Left   Disc 2-3+Pallor, Sharp rim, temporal  PPP/PPA, thin superior rim, +cupping, vascular loops, no heme Mild Pallor, Sharp rim, PPP, severely attenuated vessels   C/D Ratio 0.9 0.3   Macula Blunted foveal reflex, Drusen, Retinal pigment epithelial mottling, punctate exudates - improved, scattered DBH greatest inferior macula -- improved, persistent cystic changes / edema centrally Flat, Blunted foveal reflex, RPE mottling and clumping, scarring, atrophy, no heme   Vessels attenuated, Tortuous Severe Vascular attenuation, +fibrosis   Periphery Attached, scattered DBH Attached, pavingstone and reticular degeneration inferiorly, scattered RPE atrophy, greatest peripapillary           IMAGING AND PROCEDURES  Imaging and Procedures for @TODAY @  OCT, Retina - OU - Both Eyes       Right Eye Quality was borderline. Central Foveal Thickness: 948. Progression has been stable. Findings include no SRF, abnormal foveal contour, intraretinal hyper-reflective material, intraretinal fluid, vitreomacular adhesion (persistent IRF/IRHM temporal and inferior mac and fovea).   Left Eye Quality was borderline. Central Foveal Thickness: 233. Progression has been stable. Findings include no SRF, abnormal foveal contour, subretinal hyper-reflective material, epiretinal membrane, intraretinal fluid, pigment epithelial detachment, outer retinal atrophy, preretinal fibrosis (Stable improvement in scattered cystic changes, persistent central ORA / SRHM).   Notes *Images captured and stored on drive  Diagnosis / Impression:  OD: persistent IRF/IRHM temporal and inferior mac and fovea OS: stable improvement in scattered cystic changes, persistent central ORA / SRHM  Clinical management:  See below  Abbreviations: NFP - Normal foveal profile. CME - cystoid macular edema. PED - pigment epithelial detachment. IRF - intraretinal fluid. SRF - subretinal fluid. EZ - ellipsoid zone. ERM - epiretinal membrane. ORA - outer retinal atrophy. ORT - outer retinal  tubulation. SRHM - subretinal  hyper-reflective material      Intravitreal Injection, Pharmacologic Agent - OD - Right Eye       Time Out 04/04/2023. 9:40 AM. Confirmed correct patient, procedure, site, and patient consented.   Anesthesia Topical anesthesia was used. Anesthetic medications included Lidocaine 2%, Proparacaine 0.5%.   Procedure Preparation included 5% betadine to ocular surface, eyelid speculum. A supplied (32g) needle was used.   Injection: 6 mg faricimab-svoa 6 MG/0.05ML   Route: Intravitreal, Site: Right Eye   NDC: 16109-604-54, Lot: [id number], Expiration date: 05/13/2024, Waste: 0 mL   Post-op Post injection exam found visual acuity of at least counting fingers, no retinal detachment, perfused optic nerve. The patient tolerated the procedure well. There were no complications. The patient received written and verbal post procedure care education.            ASSESSMENT/PLAN:    ICD-10-CM   1. Central retinal vein occlusion with macular edema of both eyes  H34.8130 OCT, Retina - OU - Both Eyes    Intravitreal Injection, Pharmacologic Agent - OD - Right Eye    faricimab-svoa (VABYSMO) 6mg /0.47mL intravitreal injection    2. Severe nonproliferative diabetic retinopathy of both eyes with macular edema associated with type 2 diabetes mellitus (HCC)  N82.9562     3. Long-term (current) use of injectable non-insulin antidiabetic drugs  Z79.85     4. Essential hypertension  I10     5. Hypertensive retinopathy of both eyes  H35.033     6. Combined forms of age-related cataract of left eye  H25.812     7. Pseudophakia  Z96.1     8. Primary open angle glaucoma (POAG) of right eye, mild stage  H40.1111       1. CRVO w/ CME OU  - pt of Dr. Ashley Royalty who wished to transfer care - OS long-standing low vision (CF) with subfoveal scar and central retinal atrophy - OD with CME that was actively being managed with Ozurdex OD ~q5 wks by Dr. Ashley Royalty (last on 8.20.20)  s/p multiple IVTA and Ozurdex OD - was due for Ozurdex OD w/ Ashley Royalty on 9.25.20, but underwent cataract surgery OD w/ Alben Spittle on 9.21.20 - s/p IVTA OD #4 (10.26.20), #5 (12.10.20), #6 (01.21.21), #7 (03.05.21), #8 (04.16.21), #9 (05.28.21), #10 (07.09.21), #11 (09.14.21), #12 (10.29.21), #13 (12.17.21), #14 (01.27.22), #15 (03.11.2022), #16 (05.04.22), #17 (06.17.22), #18 (07.29.22), #19 (09.16.22), #20 (10.28.22), #21 (12.16.22), #22 (02.03.23) #23 (04.07.23), #24 (06.14.23) - s/p IVA OD #1 (07.17.23), #2 (08.14.23), #3 (09.14.23), #4 (10.18.23) #5 (12.01.24) - s/p IVE OD #1 (01.26.24), #2 (02.23.24), #3 (03.29.24), #4 (04.26.24), #5 (05.24.24) - s/p IVV OD #1 (07.03.24), #2 (08.30.24), #3 (10.15.24), #4 (11.15.24), #5 (12.17.24) #6(01.14.25)  - BCVA OD is stable at [facility]   - exam shows mild improvement in blood stained vit condensations -- white and fading - OCT shows OD: persistent IRF/IRHM greatest inferior mac and fovea; OS: stable improvement in scattered cystic changes, persistent central ORA / SRHM at 9 weeks  - recommend IVV OD #7 today, 3.21.25, w/ f/u back to 4 wks  - pt wishes to proceed with injection  - RBA of procedure discussed, questions answered - see procedure note - IVV informed consent obtained and signed, 07.03.24 (OD) - PACE approved Vabysmo OD  - f/u 4 weeks -- DFE/OCT/possible injection  2,3. Severe Non-proliferative diabetic retinopathy OU.  - OCT shows diabetic macular edema, OD -- slightly increased  - s/p IVTA OD on 06.14.23 as above  - s/p IVA OD #1  on 07.17.23 as above for DME as well as CRVO  - IVV OD #6 today as above  4,5. Hypertensive retinopathy OU  - discussed importance of tight BP control  - continue to monitor  6. Mixed form age related cataract OS  - under the expert management of [facility]  7. Pseudophakia OD  - s/p CE/IOL OD (9.21.20) by expert surgeon, Dr. Alben Spittle  - IOL in excellent position  - had post op IOP spike -- IOP now under  better control  - continue to monitor  8. POAG OD  - now under the expert management of Dr. Lottie Dawson (initial consult 07.27.23)  - s/p SLT and goniotomy OD w/ Dr. Alben Spittle  - had post-cataract IOP spike  - on Brimonidine TID OU and Timolol BID OU -- no longer using Cosopt  - IOP 16 today  Return in about 4 weeks (around 05/02/2023) for f/u CRVO OU, DFE, OCT, Possible Injxn.   Ophthalmic Meds Ordered this visit:  Meds ordered this encounter  Medications   faricimab-svoa (VABYSMO) 6mg /0.57mL intravitreal injection    This document serves as a record of services personally performed by Karie Chimera, MD, PhD. It was created on their behalf by Berlin Hun COT, an ophthalmic technician. The creation of this record is the provider's dictation and/or activities during the visit.    Electronically signed by: Berlin Hun COT 03.19.25 4:54 PM  This document serves as a record of services personally performed by Karie Chimera, MD, PhD. It was created on their behalf by Glee Arvin. Manson Passey, OA an ophthalmic technician. The creation of this record is the provider's dictation and/or activities during the visit.    Electronically signed by: Glee Arvin. Manson Passey, OA 04/04/23 4:54 PM  Karie Chimera, M.D., Ph.D. Diseases & Surgery of the Retina and Vitreous [facility] 04/04/2023   I have reviewed the above documentation for accuracy and completeness, and I agree with the above. Karie Chimera, M.D., Ph.D. 04/04/23 4:56 PM    Abbreviations: M myopia (nearsighted); A astigmatism; H hyperopia (farsighted); P presbyopia; Mrx spectacle prescription;  CTL contact lenses; OD right eye; OS left eye; OU both eyes  XT exotropia; ET esotropia; PEK punctate epithelial keratitis; PEE punctate epithelial erosions; DES dry eye syndrome; MGD meibomian gland dysfunction; ATs artificial tears; PFAT's preservative free artificial tears; NSC nuclear sclerotic cataract; PSC posterior  subcapsular cataract; ERM epi-retinal membrane; PVD posterior vitreous detachment; RD retinal detachment; DM diabetes mellitus; DR diabetic retinopathy; NPDR non-proliferative diabetic retinopathy; PDR proliferative diabetic retinopathy; CSME clinically significant macular edema; DME diabetic macular edema; dbh dot blot hemorrhages; CWS cotton wool spot; POAG primary open angle glaucoma; C/D cup-to-disc ratio; HVF humphrey visual field; GVF goldmann visual field; OCT optical coherence tomography; IOP intraocular pressure; BRVO Branch retinal vein occlusion; CRVO central retinal vein occlusion; CRAO central retinal artery occlusion; BRAO branch retinal artery occlusion; RT retinal tear; SB scleral buckle; PPV pars plana vitrectomy; VH Vitreous hemorrhage; PRP panretinal laser photocoagulation; IVK intravitreal kenalog; VMT vitreomacular traction; MH Macular hole;  NVD neovascularization of the disc; NVE neovascularization elsewhere; AREDS age related eye disease study; ARMD age related macular degeneration; POAG primary open angle glaucoma; EBMD epithelial/anterior basement membrane dystrophy; ACIOL anterior chamber intraocular lens; IOL intraocular lens; PCIOL posterior chamber intraocular lens; Phaco/IOL phacoemulsification with intraocular lens placement; PRK photorefractive keratectomy; LASIK laser assisted in situ keratomileusis; HTN hypertension; DM diabetes mellitus; COPD chronic obstructive pulmonary disease

## 2023-04-04 ENCOUNTER — Ambulatory Visit (INDEPENDENT_AMBULATORY_CARE_PROVIDER_SITE_OTHER): Payer: Medicare (Managed Care) | Admitting: Ophthalmology

## 2023-04-04 ENCOUNTER — Encounter (INDEPENDENT_AMBULATORY_CARE_PROVIDER_SITE_OTHER): Payer: Self-pay | Admitting: Ophthalmology

## 2023-04-04 DIAGNOSIS — E113413 Type 2 diabetes mellitus with severe nonproliferative diabetic retinopathy with macular edema, bilateral: Secondary | ICD-10-CM | POA: Diagnosis not present

## 2023-04-04 DIAGNOSIS — I10 Essential (primary) hypertension: Secondary | ICD-10-CM | POA: Diagnosis not present

## 2023-04-04 DIAGNOSIS — H34813 Central retinal vein occlusion, bilateral, with macular edema: Secondary | ICD-10-CM | POA: Diagnosis not present

## 2023-04-04 DIAGNOSIS — H35033 Hypertensive retinopathy, bilateral: Secondary | ICD-10-CM

## 2023-04-04 DIAGNOSIS — Z7985 Long-term (current) use of injectable non-insulin antidiabetic drugs: Secondary | ICD-10-CM

## 2023-04-04 DIAGNOSIS — H25812 Combined forms of age-related cataract, left eye: Secondary | ICD-10-CM

## 2023-04-04 DIAGNOSIS — H401111 Primary open-angle glaucoma, right eye, mild stage: Secondary | ICD-10-CM

## 2023-04-04 DIAGNOSIS — Z961 Presence of intraocular lens: Secondary | ICD-10-CM

## 2023-04-04 MED ORDER — FARICIMAB-SVOA 6 MG/0.05ML IZ SOSY
6.0000 mg | PREFILLED_SYRINGE | INTRAVITREAL | Status: AC | PRN
  Administered 2023-04-04: 6 mg via INTRAVITREAL

## 2023-04-19 ENCOUNTER — Other Ambulatory Visit: Payer: Self-pay

## 2023-04-19 ENCOUNTER — Encounter (HOSPITAL_COMMUNITY): Payer: Self-pay

## 2023-04-19 ENCOUNTER — Emergency Department (HOSPITAL_COMMUNITY): Payer: Medicare (Managed Care)

## 2023-04-19 ENCOUNTER — Inpatient Hospital Stay (HOSPITAL_COMMUNITY)
Admission: EM | Admit: 2023-04-19 | Discharge: 2023-04-20 | DRG: 684 | Discharge status: Against Medical Advice | Payer: Medicare (Managed Care) | Attending: Family Medicine | Admitting: Family Medicine

## 2023-04-19 DIAGNOSIS — R410 Disorientation, unspecified: Secondary | ICD-10-CM | POA: Diagnosis present

## 2023-04-19 DIAGNOSIS — R001 Bradycardia, unspecified: Secondary | ICD-10-CM | POA: Diagnosis present

## 2023-04-19 DIAGNOSIS — Z79890 Hormone replacement therapy (postmenopausal): Secondary | ICD-10-CM | POA: Diagnosis not present

## 2023-04-19 DIAGNOSIS — J449 Chronic obstructive pulmonary disease, unspecified: Secondary | ICD-10-CM

## 2023-04-19 DIAGNOSIS — M7989 Other specified soft tissue disorders: Secondary | ICD-10-CM | POA: Diagnosis present

## 2023-04-19 DIAGNOSIS — E039 Hypothyroidism, unspecified: Secondary | ICD-10-CM

## 2023-04-19 DIAGNOSIS — I890 Lymphedema, not elsewhere classified: Secondary | ICD-10-CM | POA: Diagnosis present

## 2023-04-19 DIAGNOSIS — N179 Acute kidney failure, unspecified: Principal | ICD-10-CM

## 2023-04-19 DIAGNOSIS — I10 Essential (primary) hypertension: Secondary | ICD-10-CM

## 2023-04-19 DIAGNOSIS — I129 Hypertensive chronic kidney disease with stage 1 through stage 4 chronic kidney disease, or unspecified chronic kidney disease: Secondary | ICD-10-CM | POA: Diagnosis present

## 2023-04-19 DIAGNOSIS — N189 Chronic kidney disease, unspecified: Secondary | ICD-10-CM | POA: Diagnosis present

## 2023-04-19 DIAGNOSIS — W19XXXA Unspecified fall, initial encounter: Secondary | ICD-10-CM

## 2023-04-19 DIAGNOSIS — Z79899 Other long term (current) drug therapy: Secondary | ICD-10-CM

## 2023-04-19 DIAGNOSIS — E785 Hyperlipidemia, unspecified: Secondary | ICD-10-CM

## 2023-04-19 DIAGNOSIS — E1122 Type 2 diabetes mellitus with diabetic chronic kidney disease: Secondary | ICD-10-CM | POA: Diagnosis present

## 2023-04-19 DIAGNOSIS — Z5329 Procedure and treatment not carried out because of patient's decision for other reasons: Secondary | ICD-10-CM | POA: Diagnosis not present

## 2023-04-19 DIAGNOSIS — R7989 Other specified abnormal findings of blood chemistry: Secondary | ICD-10-CM

## 2023-04-19 DIAGNOSIS — Z7982 Long term (current) use of aspirin: Secondary | ICD-10-CM

## 2023-04-19 DIAGNOSIS — E669 Obesity, unspecified: Secondary | ICD-10-CM | POA: Diagnosis present

## 2023-04-19 LAB — CBC WITH DIFFERENTIAL/PLATELET
Abs Immature Granulocytes: 0.02 10*3/uL (ref 0.00–0.07)
Basophils Absolute: 0.1 10*3/uL (ref 0.0–0.1)
Basophils Relative: 1 %
Eosinophils Absolute: 0.3 10*3/uL (ref 0.0–0.5)
Eosinophils Relative: 4 %
HCT: 29.5 % — ABNORMAL LOW (ref 36.0–46.0)
Hemoglobin: 9.3 g/dL — ABNORMAL LOW (ref 12.0–15.0)
Immature Granulocytes: 0 %
Lymphocytes Relative: 33 %
Lymphs Abs: 2.3 10*3/uL (ref 0.7–4.0)
MCH: 29.2 pg (ref 26.0–34.0)
MCHC: 31.5 g/dL (ref 30.0–36.0)
MCV: 92.8 fL (ref 80.0–100.0)
Monocytes Absolute: 0.7 10*3/uL (ref 0.1–1.0)
Monocytes Relative: 9 %
Neutro Abs: 3.8 10*3/uL (ref 1.7–7.7)
Neutrophils Relative %: 53 %
Platelets: 299 10*3/uL (ref 150–400)
RBC: 3.18 MIL/uL — ABNORMAL LOW (ref 3.87–5.11)
RDW: 18.1 % — ABNORMAL HIGH (ref 11.5–15.5)
WBC: 7.2 10*3/uL (ref 4.0–10.5)
nRBC: 0.4 % — ABNORMAL HIGH (ref 0.0–0.2)

## 2023-04-19 LAB — COMPREHENSIVE METABOLIC PANEL WITH GFR
ALT: 13 U/L (ref 0–44)
AST: 17 U/L (ref 15–41)
Albumin: 3.8 g/dL (ref 3.5–5.0)
Alkaline Phosphatase: 77 U/L (ref 38–126)
Anion gap: 9 (ref 5–15)
BUN: 70 mg/dL — ABNORMAL HIGH (ref 8–23)
CO2: 23 mmol/L (ref 22–32)
Calcium: 9.3 mg/dL (ref 8.9–10.3)
Chloride: 108 mmol/L (ref 98–111)
Creatinine, Ser: 3.11 mg/dL — ABNORMAL HIGH (ref 0.44–1.00)
GFR, Estimated: 15 mL/min — ABNORMAL LOW (ref >60)
Glucose, Bld: 91 mg/dL (ref 70–99)
Potassium: 3.9 mmol/L (ref 3.5–5.1)
Sodium: 140 mmol/L (ref 135–145)
Total Bilirubin: 1 mg/dL (ref 0.0–1.2)
Total Protein: 7 g/dL (ref 6.5–8.1)

## 2023-04-19 LAB — RESP PANEL BY RT-PCR (RSV, FLU A&B, COVID)  RVPGX2
Influenza A by PCR: NEGATIVE
Influenza B by PCR: NEGATIVE
Resp Syncytial Virus by PCR: NEGATIVE
SARS Coronavirus 2 by RT PCR: NEGATIVE

## 2023-04-19 LAB — CBG MONITORING, ED: Glucose-Capillary: 74 mg/dL (ref 70–99)

## 2023-04-19 LAB — BRAIN NATRIURETIC PEPTIDE: B Natriuretic Peptide: 910.1 pg/mL — ABNORMAL HIGH (ref 0.0–100.0)

## 2023-04-19 MED ORDER — INSULIN ASPART 100 UNIT/ML IJ SOLN
0.0000 [IU] | Freq: Three times a day (TID) | INTRAMUSCULAR | Status: DC
Start: 2023-04-20 — End: 2023-04-20
  Filled 2023-04-19: qty 0.15

## 2023-04-19 MED ORDER — INSULIN ASPART 100 UNIT/ML IJ SOLN
0.0000 [IU] | Freq: Every day | INTRAMUSCULAR | Status: DC
  Filled 2023-04-19: qty 0.05

## 2023-04-19 NOTE — ED Notes (Signed)
 Patient transported to CT

## 2023-04-19 NOTE — ED Triage Notes (Signed)
 BIBA from home- family states pt has been having periods of confusion for 3 days, and fell this morning, hitting her head. Pt is A&O x 3, states she didn't fall today, it was on 04/17/23. Pt also has increased leg swelling, states she hasn't had her legs wrapped this week. 110/70 BP 55 HR 96% room air 113 cbg

## 2023-04-19 NOTE — ED Provider Notes (Cosign Needed)
 [facility] Provider Note   CSN: [id number] Arrival date & time: 04/19/23  1925     History Chief Complaint  Patient presents with   Altered Mental Status   Fall    Mary [name] is a 78 y.o. female.  Patient with past history significant for type 2 diabetes, obesity, lymphedema, renal hypertension presents emergency department today with concerns of a mechanical fall.  Family called EMS with concerns that patient has had intermittent episodes of confusion over the last 3 days after they believe that she hit her head this morning.  Patient is currently alert and oriented to baseline and states that she fell on 04/17/2023 and did strike her head although she denies any loss of consciousness and is not on blood thinners.  She does currently report an increase in her leg swelling although does have baseline lower extremity edema.  She states that she has not had her legs wrapped this week and this may be contributing to the leg swelling.  Denies any feelings of pain, fatigue, headache, cough, congestion, sore throat, fever, chills or bodyaches.   Altered Mental Status Presenting symptoms: confusion   Fall       Home Medications Prior to Admission medications   Medication Sig Start Date End Date Taking? Authorizing Provider  amLODipine (NORVASC) 5 MG tablet Take 1 tablet (5 mg total) by mouth daily. 12/25/21   Miguel Rota, MD  aspirin 81 MG chewable tablet Chew 81 mg by mouth daily.    [provider]  Assure Comfort Lancets 28G MISC  05/17/14   [provider]  benzonatate (TESSALON) 200 MG capsule Take 1 capsule (200 mg total) by mouth 3 (three) times daily. 12/30/21   Kathlen Mody, MD  Blood Glucose Monitoring Suppl (ONETOUCH VERIO FLEX SYSTEM) w/Device KIT Use as directed to check blood sugars 2 times per day dx: e11.65 10/19/19   Dorothyann Peng, MD  brimonidine (ALPHAGAN) 0.2 % ophthalmic solution INSTILL 1 DROP INTO  BOTH EYES TWICE A DAY Patient taking differently: Place 1 drop into both eyes 3 (three) times daily. 07/09/21   Rennis Chris, MD  cetirizine (ZYRTEC) 10 MG tablet Take 1 tablet (10 mg total) by mouth daily. 02/12/21   Dorothyann Peng, MD  Cholecalciferol (VITAMIN D) 50 MCG (2000 UT) CAPS Take 2,000 Units by mouth daily at 6 (six) AM.    [provider]  dorzolamide-timolol (COSOPT) 22.3-6.8 MG/ML ophthalmic solution Place 1 drop into the right eye 2 (two) times daily. Patient taking differently: Place 1 drop into both eyes 2 (two) times daily. 09/28/21   Rennis Chris, MD  Emollient (BAG BALM) OINT Apply 1 Application topically 4 (four) times daily as needed (itching).    [provider]  fluticasone (FLONASE) 50 MCG/ACT nasal spray SPRAY 1 SPRAY INTO EACH NOSTRIL EVERY DAY Patient taking differently: Place 1 spray into both nostrils daily. 02/12/21   Dorothyann Peng, MD  furosemide (LASIX) 40 MG tablet TAKE 1 TABLET BY MOUTH  DAILY 06/05/20   Dorothyann Peng, MD  glucose blood [facility] VERIO) test strip Use as directed to check blood sugars 2 times per day dx: e11.65 07/12/20   Dorothyann Peng, MD  HYDROcodone-acetaminophen [facility]) 10-325 MG tablet Take 1 tablet by mouth 5 (five) times daily as needed for moderate pain. 11/27/18   [provider]  Ipratropium-Albuterol (COMBIVENT) 20-100 MCG/ACT AERS respimat Inhale 1 puff into the lungs 2 (two) times daily. 12/24/21   Amin, Ankit  C, MD  levothyroxine (SYNTHROID) 125 MCG tablet Take 125 mcg by mouth daily before breakfast.    [provider]  loperamide (IMODIUM) 2 MG capsule Take 2 mg by mouth every 6 (six) hours as needed for diarrhea or loose stools.    [provider]  metoprolol succinate (TOPROL-XL) 50 MG 24 hr tablet TAKE 1 TABLET BY MOUTH  DAILY Patient taking differently: Take 50 mg by mouth daily. 05/08/21   Dorothyann Peng, MD  montelukast (SINGULAIR) 10 MG tablet Take 10 mg by mouth at bedtime.     [provider]  nystatin cream (MYCOSTATIN) Apply 1 Application topically 2 (two) times daily as needed for dry skin.    [provider]  omeprazole (PRILOSEC) 20 MG capsule TAKE 1 CAPSULE BY MOUTH  DAILY Patient taking differently: Take 20 mg by mouth daily. 11/01/19   Dorothyann Peng, MD  OneTouch Delica Lancets 33G MISC Use as directed to check blood sugars 2 times per day dx: e11.65 10/19/19   Dorothyann Peng, MD  pravastatin (PRAVACHOL) 40 MG tablet TAKE 1 TABLET BY MOUTH EVERY DAY Patient taking differently: Take 80 mg by mouth daily. 08/11/21   Dorothyann Peng, MD  telmisartan (MICARDIS) 80 MG tablet TAKE 1 TABLET BY MOUTH  DAILY Patient taking differently: Take 80 mg by mouth daily. 09/11/20   Dorothyann Peng, MD  timolol (BETIMOL) 0.25 % ophthalmic solution Place 1 drop into both eyes 2 (two) times daily.    [provider]  triamcinolone (KENALOG) 0.1 % APPLY TO AFFECTED AREA TWICE A DAY Patient taking differently: Apply 1 Application topically 2 (two) times daily as needed (rash). 12/06/19   Dorothyann Peng, MD      Allergies    Patient has no known allergies.    Review of Systems   Review of Systems  Psychiatric/Behavioral:  Positive for confusion.   All other systems reviewed and are negative.   Physical Exam Updated Vital Signs BP (!) 103/53   Pulse (!) 55   Temp 97.7 F (36.5 C)   Resp 19   SpO2 95%  Physical Exam Vitals and nursing note reviewed.  Constitutional:      General: She is not in acute distress.    Appearance: She is well-developed.  HENT:     Head: Normocephalic and atraumatic.  Eyes:     Conjunctiva/sclera: Conjunctivae normal.  Cardiovascular:     Rate and Rhythm: Normal rate and regular rhythm.     Heart sounds: No murmur heard. Pulmonary:     Effort: Pulmonary effort is normal. No respiratory distress.     Breath sounds: Normal breath sounds.  Abdominal:     Palpations: Abdomen is soft.     Tenderness: There is no  abdominal tenderness.  Musculoskeletal:        General: No swelling.     Cervical back: Neck supple.     Right lower leg: 3+ Pitting Edema present.     Left lower leg: 3+ Pitting Edema present.  Skin:    General: Skin is warm and dry.     Capillary Refill: Capillary refill takes less than 2 seconds.  Neurological:     General: No focal deficit present.     Mental Status: She is alert and oriented to person, place, and time. Mental status is at baseline.     Cranial Nerves: No cranial nerve deficit.     Motor: No weakness.  Psychiatric:        Mood and Affect: Mood  normal.     ED Results / Procedures / Treatments   Labs (all labs ordered are listed, but only abnormal results are displayed) Labs Reviewed  CBC WITH DIFFERENTIAL/PLATELET - Abnormal; Notable for the following components:      Result Value   RBC 3.18 (*)    Hemoglobin 9.3 (*)    HCT 29.5 (*)    RDW 18.1 (*)    nRBC 0.4 (*)    All other components within normal limits  COMPREHENSIVE METABOLIC PANEL WITH GFR - Abnormal; Notable for the following components:   BUN 70 (*)    Creatinine, Ser 3.11 (*)    GFR, Estimated 15 (*)    All other components within normal limits  BRAIN NATRIURETIC PEPTIDE - Abnormal; Notable for the following components:   B Natriuretic Peptide 910.1 (*)    All other components within normal limits  RESP PANEL BY RT-PCR (RSV, FLU A&B, COVID)  [id number]  URINALYSIS, ROUTINE W REFLEX MICROSCOPIC  HEMOGLOBIN A1C  URINALYSIS, W/ REFLEX TO CULTURE (INFECTION SUSPECTED)  CBG MONITORING, ED    EKG EKG Interpretation Date/Time:  Saturday April 19 2023 20:34:41 EDT Ventricular Rate:  56 PR Interval:  224 QRS Duration:  119 QT Interval:  529 QTC Calculation: 511 R Axis:   121  Text Interpretation: Sinus bradycardia Prolonged PR interval Nonspecific intraventricular conduction delay Borderline low voltage, extremity leads Confirmed by Ernie Avena ([id number]) on 04/19/2023 9:59:20 PM  Radiology CT  Head Wo Contrast Result Date: 04/19/2023 CLINICAL DATA:  Trauma. EXAM: CT HEAD WITHOUT CONTRAST CT CERVICAL SPINE WITHOUT CONTRAST TECHNIQUE: Multidetector CT imaging of the head and cervical spine was performed following the standard protocol without intravenous contrast. Multiplanar CT image reconstructions of the cervical spine were also generated. RADIATION DOSE REDUCTION: This exam was performed according to the departmental dose-optimization program which includes automated exposure control, adjustment of the mA and/or kV according to patient size and/or use of iterative reconstruction technique. COMPARISON:  None Available. FINDINGS: CT HEAD FINDINGS Brain: The ventricles and sulci appropriate size for patient's age. Mild periventricular and deep white matter chronic microvascular ischemic changes noted. There is no acute intracranial hemorrhage. No mass effect or midline shift. No extra-axial fluid collection. Vascular: No hyperdense vessel or unexpected calcification. Skull: Normal. Negative for fracture or focal lesion. Sinuses/Orbits: No acute finding. Other: None CT CERVICAL SPINE FINDINGS Alignment: No acute subluxation. Skull base and vertebrae: No acute fracture.  Osteopenia. Soft tissues and spinal canal: No prevertebral fluid or swelling. No visible canal hematoma. Disc levels:  No acute findings.  Degenerative changes. Upper chest: Negative. Other: Bilateral carotid bulb calcified plaques. IMPRESSION: 1. No acute intracranial pathology. Mild chronic microvascular ischemic changes. 2. No acute/traumatic cervical spine pathology. Electronically Signed   By: Elgie Collard M.D.   On: 04/19/2023 21:09   CT Cervical Spine Wo Contrast Result Date: 04/19/2023 CLINICAL DATA:  Trauma. EXAM: CT HEAD WITHOUT CONTRAST CT CERVICAL SPINE WITHOUT CONTRAST TECHNIQUE: Multidetector CT imaging of the head and cervical spine was performed following the standard protocol without intravenous contrast. Multiplanar  CT image reconstructions of the cervical spine were also generated. RADIATION DOSE REDUCTION: This exam was performed according to the departmental dose-optimization program which includes automated exposure control, adjustment of the mA and/or kV according to patient size and/or use of iterative reconstruction technique. COMPARISON:  None Available. FINDINGS: CT HEAD FINDINGS Brain: The ventricles and sulci appropriate size for patient's age. Mild periventricular and deep white matter chronic microvascular ischemic changes noted. There  is no acute intracranial hemorrhage. No mass effect or midline shift. No extra-axial fluid collection. Vascular: No hyperdense vessel or unexpected calcification. Skull: Normal. Negative for fracture or focal lesion. Sinuses/Orbits: No acute finding. Other: None CT CERVICAL SPINE FINDINGS Alignment: No acute subluxation. Skull base and vertebrae: No acute fracture.  Osteopenia. Soft tissues and spinal canal: No prevertebral fluid or swelling. No visible canal hematoma. Disc levels:  No acute findings.  Degenerative changes. Upper chest: Negative. Other: Bilateral carotid bulb calcified plaques. IMPRESSION: 1. No acute intracranial pathology. Mild chronic microvascular ischemic changes. 2. No acute/traumatic cervical spine pathology. Electronically Signed   By: Elgie Collard M.D.   On: 04/19/2023 21:09   DG Chest Portable 1 View Result Date: 04/19/2023 CLINICAL DATA:  Fall, altered mental status EXAM: PORTABLE CHEST 1 VIEW COMPARISON:  12/19/2021 FINDINGS: Lungs are clear. No pneumothorax or pleural effusion. Cardiac size is mildly enlarged. Pulmonary vascularity is normal. No acute bone abnormality. IMPRESSION: 1. Mild cardiomegaly. Electronically Signed   By: Helyn Numbers M.D.   On: 04/19/2023 20:59    Procedures Procedures    Medications Ordered in ED Medications  insulin aspart (novoLOG) injection 0-15 Units (has no administration in time range)  insulin aspart  (novoLOG) injection 0-5 Units (has no administration in time range)    ED Course/ Medical Decision Making/ A&P                                 Medical Decision Making Amount and/or Complexity of Data Reviewed Labs: ordered. Radiology: ordered.  Risk Decision regarding hospitalization.   This patient presents to the ED for concern of altered mental status, fall.  Differential diagnosis includes head injury, subdural hemorrhage, syncope, dehydration, sepsis   Lab Tests:  I Ordered, and personally interpreted labs.  The pertinent results include: CBC notable anemia but slightly improved hemoglobin compared to priors, CMP with notable AKI with creatinine up to 3.11 and GFR down to 15 with no prior history of renal disease, BNP elevated at 910 indicating possible CHF, respiratory panel negative, UA pending   Imaging Studies ordered:  I ordered imaging studies including chest x-ray, CT head, CT cervical spine I independently visualized and interpreted imaging which showed chest x-ray reveals mild cardiomegaly, CT head and cervical spine unremarkable I agree with the radiologist interpretation   Problem List / ED Course:  Patient with past history significant for type 2 diabetes, obesity, lymphedema, renal hypertension presents the ED with concerns of a mechanical fall.  Family called EMS with concerns that patient had been somewhat altered in the last several days after she sustained a fall about 3 days ago.  She is not on a blood thinners.  Patient herself has no specific complaints at this time and denies any specific areas of pain, weakness, or numbness. On physical exam, patient has notable lower extremity edema which patient reports is baseline.  She has 3+ pitting edema.  No abnormal heart or lung sounds. Will proceed with AMS workup for possible source. Labs reveal AKI and and possible CHF although likely to be all AKI related given use of Lasix for her lymphedema. Will plan on  admission given unclear possible AMS that family was reported but definitive medical indication for admission. Spoke with Dr. Haroldine Laws, hospitalist, who will be admitting patient for continued management.   Final Clinical Impression(s) / ED Diagnoses Final diagnoses:  AKI (acute kidney injury) (HCC)  Fall, initial encounter  Elevated brain natriuretic peptide (BNP) level    Rx / DC Orders ED Discharge Orders     None         Smitty Knudsen, PA-C 04/19/23 2343

## 2023-04-19 NOTE — H&P (Incomplete)
 PCP:   Angela Cox, MD (Inactive)   Chief Complaint:  ***  HPI: ***  Review of Systems:  The patient denies anorexia, fever, weight loss,, vision loss, decreased hearing, hoarseness, chest pain, syncope, dyspnea on exertion, peripheral edema, balance deficits, hemoptysis, abdominal pain, melena, hematochezia, severe indigestion/heartburn, hematuria, incontinence, genital sores, muscle weakness, suspicious skin lesions, transient blindness, difficulty walking, depression, unusual weight change, abnormal bleeding, enlarged lymph nodes, angioedema, and breast masses.  Past Medical History: Past Medical History:  Diagnosis Date   Arthritis    osteoarthritis   Cataract    OS   Diabetes mellitus    Insulin dependent   Diabetic retinopathy (HCC)    NPDR OU   Glaucoma    POAG OD   H/O cardiovascular stress test    pt. reports 10/13- was normal per pt.    Hyperlipemia    Hypertension    Hypertensive retinopathy    OU   Past Surgical History:  Procedure Laterality Date   CARPAL TUNNEL RELEASE Bilateral    CATARACT EXTRACTION Right 10/05/2018   Dr. Alben Spittle   EYE SURGERY Right    Cat Sx   KNEE ARTHROSCOPY     right   QUADRICEPS TENDON REPAIR  02/17/2012   Procedure: REPAIR QUADRICEP TENDON;  Surgeon: Dannielle Huh, MD;  Location: [facility] OR;  Service: Orthopedics;  Laterality: Right;  right quadriceps repair with latera release   QUADRICEPS TENDON REPAIR Right 05/15/2012   Procedure: REPAIR QUADRICEP TENDON;  Surgeon: Dannielle Huh, MD;  Location: [facility] OR;  Service: Orthopedics;  Laterality: Right;   TOTAL KNEE ARTHROPLASTY  10/21/2011   Procedure: TOTAL KNEE ARTHROPLASTY;  Surgeon: Raymon Mutton, MD;  Location: [facility] OR;  Service: Orthopedics;  Laterality: Right;   TUBAL LIGATION      Medications: Prior to Admission medications   Medication Sig Start Date End Date Taking? Authorizing Provider  amLODipine (NORVASC) 5 MG tablet Take 1 tablet (5 mg total) by mouth daily. 12/25/21    Miguel Rota, MD  aspirin 81 MG chewable tablet Chew 81 mg by mouth daily.    [provider]  Assure Comfort Lancets 28G MISC  05/17/14   [provider]  benzonatate (TESSALON) 200 MG capsule Take 1 capsule (200 mg total) by mouth 3 (three) times daily. 12/30/21   Kathlen Mody, MD  Blood Glucose Monitoring Suppl (ONETOUCH VERIO FLEX SYSTEM) w/Device KIT Use as directed to check blood sugars 2 times per day dx: e11.65 10/19/19   Dorothyann Peng, MD  brimonidine (ALPHAGAN) 0.2 % ophthalmic solution INSTILL 1 DROP INTO BOTH EYES TWICE A DAY Patient taking differently: Place 1 drop into both eyes 3 (three) times daily. 07/09/21   Rennis Chris, MD  cetirizine (ZYRTEC) 10 MG tablet Take 1 tablet (10 mg total) by mouth daily. 02/12/21   Dorothyann Peng, MD  Cholecalciferol (VITAMIN D) 50 MCG (2000 UT) CAPS Take 2,000 Units by mouth daily at 6 (six) AM.    [provider]  dorzolamide-timolol (COSOPT) 22.3-6.8 MG/ML ophthalmic solution Place 1 drop into the right eye 2 (two) times daily. Patient taking differently: Place 1 drop into both eyes 2 (two) times daily. 09/28/21   Rennis Chris, MD  Emollient (BAG BALM) OINT Apply 1 Application topically 4 (four) times daily as needed (itching).    [provider]  fluticasone (FLONASE) 50 MCG/ACT nasal spray SPRAY 1 SPRAY INTO EACH NOSTRIL EVERY DAY Patient taking differently: Place 1 spray into both nostrils daily. 02/12/21   Allyne Gee,  Melina Schools, MD  furosemide (LASIX) 40 MG tablet TAKE 1 TABLET BY MOUTH  DAILY 06/05/20   Dorothyann Peng, MD  glucose blood [facility] VERIO) test strip Use as directed to check blood sugars 2 times per day dx: e11.65 07/12/20   Dorothyann Peng, MD  HYDROcodone-acetaminophen [facility]) 10-325 MG tablet Take 1 tablet by mouth 5 (five) times daily as needed for moderate pain. 11/27/18   [provider]  Ipratropium-Albuterol (COMBIVENT) 20-100 MCG/ACT AERS respimat Inhale 1 puff into the lungs 2 (two)  times daily. 12/24/21   Amin, Doreen Salvage, MD  levothyroxine (SYNTHROID) 125 MCG tablet Take 125 mcg by mouth daily before breakfast.    [provider]  loperamide (IMODIUM) 2 MG capsule Take 2 mg by mouth every 6 (six) hours as needed for diarrhea or loose stools.    [provider]  metoprolol succinate (TOPROL-XL) 50 MG 24 hr tablet TAKE 1 TABLET BY MOUTH  DAILY Patient taking differently: Take 50 mg by mouth daily. 05/08/21   Dorothyann Peng, MD  montelukast (SINGULAIR) 10 MG tablet Take 10 mg by mouth at bedtime.    [provider]  nystatin cream (MYCOSTATIN) Apply 1 Application topically 2 (two) times daily as needed for dry skin.    [provider]  omeprazole (PRILOSEC) 20 MG capsule TAKE 1 CAPSULE BY MOUTH  DAILY Patient taking differently: Take 20 mg by mouth daily. 11/01/19   Dorothyann Peng, MD  OneTouch Delica Lancets 33G MISC Use as directed to check blood sugars 2 times per day dx: e11.65 10/19/19   Dorothyann Peng, MD  pravastatin (PRAVACHOL) 40 MG tablet TAKE 1 TABLET BY MOUTH EVERY DAY Patient taking differently: Take 80 mg by mouth daily. 08/11/21   Dorothyann Peng, MD  telmisartan (MICARDIS) 80 MG tablet TAKE 1 TABLET BY MOUTH  DAILY Patient taking differently: Take 80 mg by mouth daily. 09/11/20   Dorothyann Peng, MD  timolol (BETIMOL) 0.25 % ophthalmic solution Place 1 drop into both eyes 2 (two) times daily.    [provider]  triamcinolone (KENALOG) 0.1 % APPLY TO AFFECTED AREA TWICE A DAY Patient taking differently: Apply 1 Application topically 2 (two) times daily as needed (rash). 12/06/19   Dorothyann Peng, MD    Allergies:  No Known Allergies  Social History:  reports that she quit smoking about 8 years ago. Her smoking use included cigarettes. She started smoking about 56 years ago. She has a 47.5 pack-year smoking history. She has never used smokeless tobacco. She reports current drug use. Drug: Hydrocodone. She reports that she  does not drink alcohol.  Family History: Family History  Problem Relation Age of Onset   Hypertension Mother    Heart Problems Father    Gout Father    Arthritis Father     Physical Exam: Vitals:   04/19/23 2030 04/19/23 2135 04/19/23 2201 04/19/23 2300  BP: (!) 99/57 95/60 (!) 101/52 (!) 103/53  Pulse: (!) 56 (!) 55 (!) 55 (!) 55  Resp: 11  11 19   Temp:      SpO2: 91% 93% 97% 95%    General:  Alert and oriented times three, well developed and nourished, no acute distress Eyes: PERRLA, pink conjunctiva, no scleral icterus ENT: Moist oral mucosa, neck supple, no thyromegaly Lungs: clear to ascultation, no wheeze, no crackles, no use of accessory muscles Cardiovascular: regular rate and rhythm, no regurgitation, no gallops, no murmurs. No carotid bruits, no JVD Abdomen: soft, positive BS, non-tender, non-distended, no organomegaly, not  an acute abdomen GU: not examined Neuro: CN II - XII grossly intact, sensation intact Musculoskeletal: strength 5/5 all extremities, no clubbing, cyanosis or edema Skin: no rash, no subcutaneous crepitation, no decubitus Psych: appropriate patient   Labs on Admission:  Recent Labs    04/19/23 2105  NA 140  K 3.9  CL 108  CO2 23  GLUCOSE 91  BUN 70*  CREATININE 3.11*  CALCIUM 9.3   Recent Labs    04/19/23 2105  AST 17  ALT 13  ALKPHOS 77  BILITOT 1.0  PROT 7.0  ALBUMIN 3.8    Recent Labs    04/19/23 2105  WBC 7.2  NEUTROABS 3.8  HGB 9.3*  HCT 29.5*  MCV 92.8  PLT 299    Micro Results: Recent Results (from the past 240 hours)  Resp panel by RT-PCR (RSV, Flu A&B, Covid) Anterior Nasal Swab     Status: None   Collection Time: 04/19/23  9:05 PM   Specimen: Anterior Nasal Swab  Result Value Ref Range Status   SARS Coronavirus 2 by RT PCR NEGATIVE NEGATIVE Final    Comment: (NOTE) SARS-CoV-2 target nucleic acids are NOT DETECTED.  The SARS-CoV-2 RNA is generally detectable in upper respiratory specimens during the  acute phase of infection. The lowest concentration of SARS-CoV-2 viral copies this assay can detect is 138 copies/mL. A negative result does not preclude SARS-Cov-2 infection and should not be used as the sole basis for treatment or other patient management decisions. A negative result may occur with  improper specimen collection/handling, submission of specimen other than nasopharyngeal swab, presence of viral mutation(s) within the areas targeted by this assay, and inadequate number of viral copies(<138 copies/mL). A negative result must be combined with clinical observations, patient history, and epidemiological information. The expected result is Negative.  Fact Sheet for Patients:  BloggerCourse.com  Fact Sheet for Healthcare Providers:  SeriousBroker.it  This test is no t yet approved or cleared by the [place] FDA and  has been authorized for detection and/or diagnosis of SARS-CoV-2 by FDA under an Emergency Use Authorization (EUA). This EUA will remain  in effect (meaning this test can be used) for the duration of the COVID-19 declaration under Section 564(b)(1) of the Act, 21 U.S.C.section 360bbb-3(b)(1), unless the authorization is terminated  or revoked sooner.       Influenza A by PCR NEGATIVE NEGATIVE Final   Influenza B by PCR NEGATIVE NEGATIVE Final    Comment: (NOTE) The Xpert Xpress SARS-CoV-2/FLU/RSV plus assay is intended as an aid in the diagnosis of influenza from Nasopharyngeal swab specimens and should not be used as a sole basis for treatment. Nasal washings and aspirates are unacceptable for Xpert Xpress SARS-CoV-2/FLU/RSV testing.  Fact Sheet for Patients: BloggerCourse.com  Fact Sheet for Healthcare Providers: SeriousBroker.it  This test is not yet approved or cleared by the [place] FDA and has been authorized for detection and/or  diagnosis of SARS-CoV-2 by FDA under an Emergency Use Authorization (EUA). This EUA will remain in effect (meaning this test can be used) for the duration of the COVID-19 declaration under Section 564(b)(1) of the Act, 21 U.S.C. section 360bbb-3(b)(1), unless the authorization is terminated or revoked.     Resp Syncytial Virus by PCR NEGATIVE NEGATIVE Final    Comment: (NOTE) Fact Sheet for Patients: BloggerCourse.com  Fact Sheet for Healthcare Providers: SeriousBroker.it  This test is not yet approved or cleared by the [place] FDA and has been authorized for detection and/or diagnosis  of SARS-CoV-2 by FDA under an Emergency Use Authorization (EUA). This EUA will remain in effect (meaning this test can be used) for the duration of the COVID-19 declaration under Section 564(b)(1) of the Act, 21 U.S.C. section 360bbb-3(b)(1), unless the authorization is terminated or revoked.  Performed at [facility], [address], [place], [place] [address]      Radiological Exams on Admission: CT Head Wo Contrast Result Date: 04/19/2023 CLINICAL DATA:  Trauma. EXAM: CT HEAD WITHOUT CONTRAST CT CERVICAL SPINE WITHOUT CONTRAST TECHNIQUE: Multidetector CT imaging of the head and cervical spine was performed following the standard protocol without intravenous contrast. Multiplanar CT image reconstructions of the cervical spine were also generated. RADIATION DOSE REDUCTION: This exam was performed according to the departmental dose-optimization program which includes automated exposure control, adjustment of the mA and/or kV according to patient size and/or use of iterative reconstruction technique. COMPARISON:  None Available. FINDINGS: CT HEAD FINDINGS Brain: The ventricles and sulci appropriate size for patient's age. Mild periventricular and deep white matter chronic microvascular ischemic changes noted. There is no acute  intracranial hemorrhage. No mass effect or midline shift. No extra-axial fluid collection. Vascular: No hyperdense vessel or unexpected calcification. Skull: Normal. Negative for fracture or focal lesion. Sinuses/Orbits: No acute finding. Other: None CT CERVICAL SPINE FINDINGS Alignment: No acute subluxation. Skull base and vertebrae: No acute fracture.  Osteopenia. Soft tissues and spinal canal: No prevertebral fluid or swelling. No visible canal hematoma. Disc levels:  No acute findings.  Degenerative changes. Upper chest: Negative. Other: Bilateral carotid bulb calcified plaques. IMPRESSION: 1. No acute intracranial pathology. Mild chronic microvascular ischemic changes. 2. No acute/traumatic cervical spine pathology. Electronically Signed   By: Elgie Collard M.D.   On: 04/19/2023 21:09   CT Cervical Spine Wo Contrast Result Date: 04/19/2023 CLINICAL DATA:  Trauma. EXAM: CT HEAD WITHOUT CONTRAST CT CERVICAL SPINE WITHOUT CONTRAST TECHNIQUE: Multidetector CT imaging of the head and cervical spine was performed following the standard protocol without intravenous contrast. Multiplanar CT image reconstructions of the cervical spine were also generated. RADIATION DOSE REDUCTION: This exam was performed according to the departmental dose-optimization program which includes automated exposure control, adjustment of the mA and/or kV according to patient size and/or use of iterative reconstruction technique. COMPARISON:  None Available. FINDINGS: CT HEAD FINDINGS Brain: The ventricles and sulci appropriate size for patient's age. Mild periventricular and deep white matter chronic microvascular ischemic changes noted. There is no acute intracranial hemorrhage. No mass effect or midline shift. No extra-axial fluid collection. Vascular: No hyperdense vessel or unexpected calcification. Skull: Normal. Negative for fracture or focal lesion. Sinuses/Orbits: No acute finding. Other: None CT CERVICAL SPINE FINDINGS  Alignment: No acute subluxation. Skull base and vertebrae: No acute fracture.  Osteopenia. Soft tissues and spinal canal: No prevertebral fluid or swelling. No visible canal hematoma. Disc levels:  No acute findings.  Degenerative changes. Upper chest: Negative. Other: Bilateral carotid bulb calcified plaques. IMPRESSION: 1. No acute intracranial pathology. Mild chronic microvascular ischemic changes. 2. No acute/traumatic cervical spine pathology. Electronically Signed   By: Elgie Collard M.D.   On: 04/19/2023 21:09   DG Chest Portable 1 View Result Date: 04/19/2023 CLINICAL DATA:  Fall, altered mental status EXAM: PORTABLE CHEST 1 VIEW COMPARISON:  12/19/2021 FINDINGS: Lungs are clear. No pneumothorax or pleural effusion. Cardiac size is mildly enlarged. Pulmonary vascularity is normal. No acute bone abnormality. IMPRESSION: 1. Mild cardiomegaly. Electronically Signed   By: Helyn Numbers M.D.   On: 04/19/2023  20:59    Assessment/Plan Present on Admission:  AKI (acute kidney injury) (HCC) -Gentle IV fluid hydration.  I's and O's, BMP in a.m. -Lasix, Micardis on hold   Elevated BNP //  lymphedema -   HTN -Norvasc, metoprolol resumed -Micardis on hold   HLD -Pravastatin resumed   COPD -Combivent and Singulair resumed   Hypothyroidism -Synthroid resumed   T2DM -Sliding scale ordered  Morbid (severe) obesity due to excess calories (HCC)   Mary [name] 04/19/2023, 11:32 PM

## 2023-04-20 NOTE — Discharge Instructions (Addendum)
 You were seen today for concerns of falls and possible confusion. Your labs reveal that you have an acute kidney injury and excess fluid around your heart. Please discontinue all your medications for the next 5 days and ensure you are hydrating properly. Please follow up with [facility]. Return to the ER for worsening symptoms.

## 2023-04-20 NOTE — ED Notes (Signed)
 PA spoke with pt. About being admitted. Pt. Refused and wanted to leave AMA. Pt. A&O x 4. Pt. Is non-ambulatory and blind. Pt. Uses motorized wheelchair at home. PA stated he spoke with family about transportation but family has no way to provide transportation for pt as she is immobile. Arranged [facility] for transportation back home.

## 2023-04-30 NOTE — Progress Notes (Signed)
 [facility] - Clinic Note  05/02/2023     CHIEF COMPLAINT Patient presents for Retina Follow Up    HISTORY OF PRESENT ILLNESS: Mary [name] is a 78 y.o. female who presents to the clinic today for:   HPI     Retina Follow Up   Patient presents with  CRVO/BRVO.  In both eyes.  This started 4 weeks ago.  I, the attending physician,  performed the HPI with the patient and updated documentation appropriately.        Comments   Patient here for 4 weeks retina follow up for CRVO OU. Patient states vision can't see. Sight is gone. No eye pain using drops yellow top BID OD and Purple top TID OU.      Last edited by Ronelle Coffee, MD on 05/02/2023 12:12 PM.      Referring physician: No referring provider defined for this encounter.  HISTORICAL INFORMATION:   Selected notes from the MEDICAL RECORD NUMBER Transferred care from Dr. Augustus Ledger   CURRENT MEDICATIONS: Current Outpatient Medications (Ophthalmic Drugs)  Medication Sig   brimonidine  (ALPHAGAN ) 0.2 % ophthalmic solution INSTILL 1 DROP INTO BOTH EYES TWICE A DAY (Patient taking differently: Place 1 drop into both eyes 3 (three) times daily.)   dorzolamide -timolol  (COSOPT ) 22.3-6.8 MG/ML ophthalmic solution Place 1 drop into the right eye 2 (two) times daily. (Patient taking differently: Place 1 drop into both eyes 2 (two) times daily.)   timolol  (BETIMOL ) 0.25 % ophthalmic solution Place 1 drop into both eyes 2 (two) times daily.   No current facility-administered medications for this visit. (Ophthalmic Drugs)   Current Outpatient Medications (Other)  Medication Sig   amLODipine  (NORVASC ) 5 MG tablet Take 1 tablet (5 mg total) by mouth daily.   aspirin  81 MG chewable tablet Chew 81 mg by mouth daily.   Assure Comfort Lancets 28G MISC    benzonatate  (TESSALON ) 200 MG capsule Take 1 capsule (200 mg total) by mouth 3 (three) times daily.   Blood Glucose Monitoring Suppl (ONETOUCH VERIO FLEX SYSTEM)  w/Device KIT Use as directed to check blood sugars 2 times per day dx: e11.65   cetirizine  (ZYRTEC ) 10 MG tablet Take 1 tablet (10 mg total) by mouth daily.   Cholecalciferol (VITAMIN D) 50 MCG (2000 UT) CAPS Take 2,000 Units by mouth daily at 6 (six) AM.   Emollient (BAG BALM) OINT Apply 1 Application topically 4 (four) times daily as needed (itching).   fluticasone  (FLONASE ) 50 MCG/ACT nasal spray SPRAY 1 SPRAY INTO EACH NOSTRIL EVERY DAY (Patient taking differently: Place 1 spray into both nostrils daily.)   furosemide  (LASIX ) 40 MG tablet TAKE 1 TABLET BY MOUTH  DAILY   glucose blood (ONETOUCH VERIO) test strip Use as directed to check blood sugars 2 times per day dx: e11.65   HYDROcodone -acetaminophen  (NORCO) 10-325 MG tablet Take 1 tablet by mouth 5 (five) times daily as needed for moderate pain.   Ipratropium-Albuterol  (COMBIVENT ) 20-100 MCG/ACT AERS respimat Inhale 1 puff into the lungs 2 (two) times daily.   levothyroxine  (SYNTHROID ) 125 MCG tablet Take 125 mcg by mouth daily before breakfast.   loperamide (IMODIUM) 2 MG capsule Take 2 mg by mouth every 6 (six) hours as needed for diarrhea or loose stools.   metoprolol  succinate (TOPROL -XL) 50 MG 24 hr tablet TAKE 1 TABLET BY MOUTH  DAILY (Patient taking differently: Take 50 mg by mouth daily.)   montelukast  (SINGULAIR ) 10 MG tablet Take 10 mg by mouth  at bedtime.   nystatin cream (MYCOSTATIN) Apply 1 Application topically 2 (two) times daily as needed for dry skin.   omeprazole  (PRILOSEC) 20 MG capsule TAKE 1 CAPSULE BY MOUTH  DAILY (Patient taking differently: Take 20 mg by mouth daily.)   OneTouch Delica Lancets 33G MISC Use as directed to check blood sugars 2 times per day dx: e11.65   pravastatin  (PRAVACHOL ) 40 MG tablet TAKE 1 TABLET BY MOUTH EVERY DAY (Patient taking differently: Take 80 mg by mouth daily.)   telmisartan  (MICARDIS ) 80 MG tablet TAKE 1 TABLET BY MOUTH  DAILY (Patient taking differently: Take 80 mg by mouth daily.)    triamcinolone  (KENALOG ) 0.1 % APPLY TO AFFECTED AREA TWICE A DAY (Patient taking differently: Apply 1 Application topically 2 (two) times daily as needed (rash).)   No current facility-administered medications for this visit. (Other)   REVIEW OF SYSTEMS: ROS   Positive for: Skin, Endocrine, Cardiovascular, Eyes Negative for: Constitutional, Gastrointestinal, Neurological, Genitourinary, Musculoskeletal, HENT, Respiratory, Psychiatric, Allergic/Imm, Heme/Lymph Last edited by Sylvan Evener, COA on 05/02/2023 10:05 AM.     ALLERGIES No Known Allergies  PAST MEDICAL HISTORY Past Medical History:  Diagnosis Date   Arthritis    osteoarthritis   Cataract    OS   Diabetes mellitus    Insulin  dependent   Diabetic retinopathy (HCC)    NPDR OU   Glaucoma    POAG OD   H/O cardiovascular stress test    pt. reports 10/13- was normal per pt.    Hyperlipemia    Hypertension    Hypertensive retinopathy    OU   Past Surgical History:  Procedure Laterality Date   CARPAL TUNNEL RELEASE Bilateral    CATARACT EXTRACTION Right 10/05/2018   Dr. Reyne Cave   EYE SURGERY Right    Cat Sx   KNEE ARTHROSCOPY     right   QUADRICEPS TENDON REPAIR  02/17/2012   Procedure: REPAIR QUADRICEP TENDON;  Surgeon: Christie Cox, MD;  [facility] OR;  Service: Orthopedics;  Laterality: Right;  right quadriceps repair with latera release   QUADRICEPS TENDON REPAIR Right 05/15/2012   Procedure: REPAIR QUADRICEP TENDON;  Surgeon: Christie Cox, MD;  Location: [facility] OR;  Service: Orthopedics;  Laterality: Right;   TOTAL KNEE ARTHROPLASTY  10/21/2011   Procedure: TOTAL KNEE ARTHROPLASTY;  Surgeon: Florencia Hunter, MD;  Location: [facility] OR;  Service: Orthopedics;  Laterality: Right;   TUBAL LIGATION     FAMILY HISTORY Family History  Problem Relation Age of Onset   Hypertension Mother    Heart Problems Father    Gout Father    Arthritis Father    SOCIAL HISTORY Social History   Tobacco Use   Smoking status:  Former    Current packs/day: 0.00    Average packs/day: 1 pack/day for 47.5 years (47.5 ttl pk-yrs)    Types: Cigarettes    Start date: 01/15/1967    Quit date: 06/28/2014    Years since quitting: 8.8   Smokeless tobacco: Never  Vaping Use   Vaping status: Never Used  Substance Use Topics   Alcohol use: No   Drug use: Yes    Types: Hydrocodone        OPHTHALMIC EXAM: Base Eye Exam     Visual Acuity (Snellen - Linear)       Right Left   Dist [place] CF at 1' HM         Tonometry (Tonopen, 10:02 AM)       Right Left  Pressure 14 18         Pupils       Dark Light Shape React APD   Right 2 1 Round Slow None   Left 2 2 Round Minimal None         Visual Fields (Counting fingers)       Left Right   Restrictions Total superior temporal, inferior temporal, superior nasal, inferior nasal deficiencies Partial inner superior temporal, inferior temporal, superior nasal, inferior nasal deficiencies         Extraocular Movement       Right Left    Full, Ortho Full, Ortho         Neuro/Psych     Oriented x3: Yes   Mood/Affect: Normal         Dilation     Both eyes: 1.0% Mydriacyl, 2.5% Phenylephrine  @ 10:02 AM           Slit Lamp and Fundus Exam     Slit Lamp Exam       Right Left   Lids/Lashes Dermatochalasis - upper lid, mild Meibomian gland dysfunction Dermatochalasis - upper lid, mild Meibomian gland dysfunction   Conjunctiva/Sclera Melanosis Melanosis   Cornea Trace Punctate epithelial erosions, well healed temporal cataract wounds, arcus, trace EBMD, Descemet's folds Mild arcus, trace PEE   Anterior Chamber Deep and quiet Deep and quiet   Iris Round and poorly dilated to 3.63mm Round and moderately dilated to 6mm   Lens PC IOL in good position, trace, cortical remnant temporally, PC folds 3+ Nuclear sclerosis with brunescense, 3+ Cortical cataract   Anterior Vitreous Vitreous syneresis, mild blood stained vitreous condensations -- now white and  fading Vitreous syneresis         Fundus Exam       Right Left   Disc 2-3+Pallor, Sharp rim, temporal PPP/PPA, thin superior rim, +cupping, vascular loops, no heme Mild Pallor, Sharp rim, PPP, severely attenuated vessels   C/D Ratio 0.9 0.3   Macula Hazy view, Blunted foveal reflex, Drusen, Retinal pigment epithelial mottling, punctate exudates - improved, scattered DBH greatest inferior macula -- improved, persistent cystic changes / edema centrally Flat, Blunted foveal reflex, RPE mottling and clumping, scarring, atrophy, no heme   Vessels attenuated, Tortuous Severe Vascular attenuation, +fibrosis   Periphery Attached, scattered DBH Attached, pavingstone and reticular degeneration inferiorly, scattered RPE atrophy, greatest peripapillary           Refraction     Wearing Rx       Sphere Cylinder Axis Add   Right -1.00 +0.75 180 +2.25   Left -0.50 +0.75 155 +2.25           IMAGING AND PROCEDURES  Imaging and Procedures for @TODAY @  Intravitreal Injection, Pharmacologic Agent - OD - Right Eye       Time Out 05/02/2023. 11:02 AM. Confirmed correct patient, procedure, site, and patient consented.   Anesthesia Topical anesthesia was used. Anesthetic medications included Lidocaine  2%, Proparacaine 0.5%.   Procedure Preparation included 5% betadine to ocular surface, eyelid speculum. A (32g) needle was used.   Injection: 8 mg aflibercept  8 MG/0.07ML (Patient supplied)   Route: Intravitreal, Site: Right Eye   NDC: [id number], Lot: [id number], Expiration date: 11/14/2023, Waste: 0 mL   Post-op Post injection exam found visual acuity of at least counting fingers, no retinal detachment, perfused optic nerve. The patient tolerated the procedure well. There were no complications. The patient received written and verbal post procedure care education.   Notes **SAMPLE  MEDICATION ADMINISTERED**  An AC tap was performed following injection due to elevated IOP using a 30  gauge needle on a syringe with the plunger removed. The needle was placed at the limbus at 7 oclock and approximately 0.07cc of aqueous was removed from the anterior chamber. Betadine was applied to the tap area before and after the paracentesis was performed. There were no complications. The patient tolerated the procedure well. The IOP was rechecked and was found to be ~10 mmHg by digital palpation.            ASSESSMENT/PLAN:    ICD-10-CM   1. Central retinal vein occlusion with macular edema of both eyes  H34.8130 Intravitreal Injection, Pharmacologic Agent - OD - Right Eye    aflibercept  (EYLEA  HD) ophthalmic injection 8 mg    2. Severe nonproliferative diabetic retinopathy of both eyes with macular edema associated with type 2 diabetes mellitus (HCC)  Z61.0960 Intravitreal Injection, Pharmacologic Agent - OD - Right Eye    aflibercept  (EYLEA  HD) ophthalmic injection 8 mg    3. Long-term (current) use of injectable non-insulin  antidiabetic drugs  Z79.85     4. Essential hypertension  I10     5. Hypertensive retinopathy of both eyes  H35.033     6. Combined forms of age-related cataract of left eye  H25.812     7. Pseudophakia  Z96.1     8. Primary open angle glaucoma (POAG) of right eye, mild stage  H40.1111      1. CRVO w/ CME OU  - pt of Dr. Augustus Ledger who wished to transfer care - OS long-standing low vision (CF) with subfoveal scar and central retinal atrophy - OD with CME that was actively being managed with Ozurdex  OD ~q5 wks by Dr. Augustus Ledger (last on 8.20.20) s/p multiple IVTA and Ozurdex  OD - was due for Ozurdex  OD w/ Augustus Ledger on 9.25.20, but underwent cataract surgery OD w/ Reyne Cave on 9.21.20 - s/p IVTA OD #4 (10.26.20), #5 (12.10.20), #6 (01.21.21), #7 (03.05.21), #8 (04.16.21), #9 (05.28.21), #10 (07.09.21), #11 (09.14.21), #12 (10.29.21), #13 (12.17.21), #14 (01.27.22), #15 (03.11.2022), #16 (05.04.22), #17 (06.17.22), #18 (07.29.22), #19 (09.16.22), #20 (10.28.22), #21  (12.16.22), #22 (02.03.23) #23 (04.07.23), #24 (06.14.23) - s/p IVA OD #1 (07.17.23), #2 (08.14.23), #3 (09.14.23), #4 (10.18.23) #5 (12.01.24) - s/p IVE OD #1 (01.26.24), #2 (02.23.24), #3 (03.29.24), #4 (04.26.24), #5 (05.24.24) - s/p IVV OD #1 (07.03.24), #2 (08.30.24), #3 (10.15.24), #4 (11.15.24), #5 (12.17.24), #6 (01.14.25), #7 (03.21.25)  - BCVA OD is stable at [facility]   - exam shows mild improvement in blood stained vit condensations -- white and fading - no OCT obtained today -- cameras not working  - recommend IVE HD -- sample OD #1 today, 04.18.25, w/ f/u back to 4 wks  - pt wishes to proceed with injection  - RBA of procedure discussed, questions answered - see procedure note - IVV informed consent obtained and signed, 07.03.24 (OD) - PACE approved Vabysmo  OD  - f/u 4 weeks -- DFE/OCT/possible injection  2,3. Severe Non-proliferative diabetic retinopathy OU.  - no OCT obtained today   - s/p IVTA OD on 06.14.23 as above  - s/p IVA OD #1 on 07.17.23 as above for DME as well as CRVO  - IVE HD sample #1 today as above  4,5. Hypertensive retinopathy OU  - discussed importance of tight BP control  - continue to monitor  6. Mixed form age related cataract OS  - under the expert management of [facility]  7. Pseudophakia OD  - s/p CE/IOL OD (9.21.20) by expert surgeon, Dr. Reyne Cave  - IOL in excellent position  - had post op IOP spike -- IOP now under better control  - continue to monitor  8. POAG OD  - now under the expert management of Dr. Leanor Proper (initial consult 07.27.23)  - s/p SLT and goniotomy OD w/ Dr. Reyne Cave  - had post-cataract IOP spike  - on Brimonidine  TID OU and Timolol  BID OU -- no longer using Cosopt   - IOP 14,18 today  Return in about 4 weeks (around 05/30/2023) for f/u CRVO OU, DFE, OCT, Possible Injxn.   Ophthalmic Meds Ordered this visit:  Meds ordered this encounter  Medications   aflibercept  (EYLEA  HD) ophthalmic injection 8 mg    This document  serves as a record of services personally performed by Jeanice Millard, MD, PhD. It was created on their behalf by Eller Gut COT, an ophthalmic technician. The creation of this record is the provider's dictation and/or activities during the visit.    Electronically signed by: Eller Gut COT 04.16.2025  12:25 AM  This document serves as a record of services personally performed by Jeanice Millard, MD, PhD. It was created on their behalf by Morley Arabia. Bevin Bucks, OA an ophthalmic technician. The creation of this record is the provider's dictation and/or activities during the visit.    Electronically signed by: Morley Arabia. Bevin Bucks, OA 05/04/23 12:25 AM  Jeanice Millard, M.D., Ph.D. Diseases & Surgery of the Retina and Vitreous [facility] 05/02/2023   I have reviewed the above documentation for accuracy and completeness, and I agree with the above. Jeanice Millard, M.D., Ph.D. 05/04/23 12:26 AM    Abbreviations: M myopia (nearsighted); A astigmatism; H hyperopia (farsighted); P presbyopia; Mrx spectacle prescription;  CTL contact lenses; OD right eye; OS left eye; OU both eyes  XT exotropia; ET esotropia; PEK punctate epithelial keratitis; PEE punctate epithelial erosions; DES dry eye syndrome; MGD meibomian gland dysfunction; ATs artificial tears; PFAT's preservative free artificial tears; NSC nuclear sclerotic cataract; PSC posterior subcapsular cataract; ERM epi-retinal membrane; PVD posterior vitreous detachment; RD retinal detachment; DM diabetes mellitus; DR diabetic retinopathy; NPDR non-proliferative diabetic retinopathy; PDR proliferative diabetic retinopathy; CSME clinically significant macular edema; DME diabetic macular edema; dbh dot blot hemorrhages; CWS cotton wool spot; POAG primary open angle glaucoma; C/D cup-to-disc ratio; HVF humphrey visual field; GVF goldmann visual field; OCT optical coherence tomography; IOP intraocular pressure; BRVO Branch  retinal vein occlusion; CRVO central retinal vein occlusion; CRAO central retinal artery occlusion; BRAO branch retinal artery occlusion; RT retinal tear; SB scleral buckle; PPV pars plana vitrectomy; VH Vitreous hemorrhage; PRP panretinal laser photocoagulation; IVK intravitreal kenalog ; VMT vitreomacular traction; MH Macular hole;  NVD neovascularization of the disc; NVE neovascularization elsewhere; AREDS age related eye disease study; ARMD age related macular degeneration; POAG primary open angle glaucoma; EBMD epithelial/anterior basement membrane dystrophy; ACIOL anterior chamber intraocular lens; IOL intraocular lens; PCIOL posterior chamber intraocular lens; Phaco/IOL phacoemulsification with intraocular lens placement; PRK photorefractive keratectomy; LASIK laser assisted in situ keratomileusis; HTN hypertension; DM diabetes mellitus; COPD chronic obstructive pulmonary disease

## 2023-05-02 ENCOUNTER — Encounter (INDEPENDENT_AMBULATORY_CARE_PROVIDER_SITE_OTHER): Payer: Self-pay | Admitting: Ophthalmology

## 2023-05-02 ENCOUNTER — Ambulatory Visit (INDEPENDENT_AMBULATORY_CARE_PROVIDER_SITE_OTHER): Payer: Medicare (Managed Care) | Admitting: Ophthalmology

## 2023-05-02 DIAGNOSIS — Z7985 Long-term (current) use of injectable non-insulin antidiabetic drugs: Secondary | ICD-10-CM

## 2023-05-02 DIAGNOSIS — Z961 Presence of intraocular lens: Secondary | ICD-10-CM

## 2023-05-02 DIAGNOSIS — E113413 Type 2 diabetes mellitus with severe nonproliferative diabetic retinopathy with macular edema, bilateral: Secondary | ICD-10-CM | POA: Diagnosis not present

## 2023-05-02 DIAGNOSIS — H34813 Central retinal vein occlusion, bilateral, with macular edema: Secondary | ICD-10-CM

## 2023-05-02 DIAGNOSIS — H401111 Primary open-angle glaucoma, right eye, mild stage: Secondary | ICD-10-CM

## 2023-05-02 DIAGNOSIS — H35033 Hypertensive retinopathy, bilateral: Secondary | ICD-10-CM

## 2023-05-02 DIAGNOSIS — I10 Essential (primary) hypertension: Secondary | ICD-10-CM | POA: Diagnosis not present

## 2023-05-02 DIAGNOSIS — H25812 Combined forms of age-related cataract, left eye: Secondary | ICD-10-CM

## 2023-05-02 MED ORDER — AFLIBERCEPT 8 MG/0.07ML IZ SOLN
8.0000 mg | INTRAVITREAL | Status: AC | PRN
  Administered 2023-05-02: 8 mg via INTRAVITREAL

## 2023-05-29 NOTE — Progress Notes (Signed)
 [facility] - Clinic Note  05/30/2023     CHIEF COMPLAINT Patient presents for Retina Follow Up    HISTORY OF PRESENT ILLNESS: Mary [name] is a 78 y.o. female who presents to the clinic today for:   HPI     Retina Follow Up   Patient presents with  CRVO/BRVO.  In both eyes.  This started 4 weeks ago.  I, the attending physician,  performed the HPI with the patient and updated documentation appropriately.        Comments   Pt presents for 4 week retina follow up, CRVO OU. Patient states vision is worse and she cannot see anything. Pt states she is not consistently getting her IOP meds (Dorz-Tim BID OD and Brimonidine  TID OU) because the nurses forget and she doesn't want to cause any trouble.      Last edited by Ronelle Coffee, MD on 06/04/2023 12:21 AM.     Patient states the vision is poor and she can not see.  Referring physician: No referring provider defined for this encounter.  HISTORICAL INFORMATION:   Selected notes from the MEDICAL RECORD NUMBER Transferred care from Dr. Augustus Ledger   CURRENT MEDICATIONS: Current Outpatient Medications (Ophthalmic Drugs)  Medication Sig   brimonidine  (ALPHAGAN ) 0.2 % ophthalmic solution INSTILL 1 DROP INTO BOTH EYES TWICE A DAY (Patient taking differently: Place 1 drop into both eyes 3 (three) times daily.)   dorzolamide -timolol  (COSOPT ) 22.3-6.8 MG/ML ophthalmic solution Place 1 drop into the right eye 2 (two) times daily. (Patient taking differently: Place 1 drop into both eyes 2 (two) times daily.)   timolol  (BETIMOL ) 0.25 % ophthalmic solution Place 1 drop into both eyes 2 (two) times daily.   No current facility-administered medications for this visit. (Ophthalmic Drugs)   Current Outpatient Medications (Other)  Medication Sig   amLODipine  (NORVASC ) 5 MG tablet Take 1 tablet (5 mg total) by mouth daily.   aspirin  81 MG chewable tablet Chew 81 mg by mouth daily.   Assure Comfort Lancets 28G MISC     benzonatate  (TESSALON ) 200 MG capsule Take 1 capsule (200 mg total) by mouth 3 (three) times daily.   Blood Glucose Monitoring Suppl (ONETOUCH VERIO FLEX SYSTEM) w/Device KIT Use as directed to check blood sugars 2 times per day dx: e11.65   cetirizine  (ZYRTEC ) 10 MG tablet Take 1 tablet (10 mg total) by mouth daily.   Cholecalciferol (VITAMIN D) 50 MCG (2000 UT) CAPS Take 2,000 Units by mouth daily at 6 (six) AM.   Emollient (BAG BALM) OINT Apply 1 Application topically 4 (four) times daily as needed (itching).   fluticasone  (FLONASE ) 50 MCG/ACT nasal spray SPRAY 1 SPRAY INTO EACH NOSTRIL EVERY DAY (Patient taking differently: Place 1 spray into both nostrils daily.)   furosemide  (LASIX ) 40 MG tablet TAKE 1 TABLET BY MOUTH  DAILY   glucose blood (ONETOUCH VERIO) test strip Use as directed to check blood sugars 2 times per day dx: e11.65   HYDROcodone -acetaminophen  (NORCO) 10-325 MG tablet Take 1 tablet by mouth 5 (five) times daily as needed for moderate pain.   Ipratropium-Albuterol  (COMBIVENT ) 20-100 MCG/ACT AERS respimat Inhale 1 puff into the lungs 2 (two) times daily.   levothyroxine  (SYNTHROID ) 125 MCG tablet Take 125 mcg by mouth daily before breakfast.   loperamide (IMODIUM) 2 MG capsule Take 2 mg by mouth every 6 (six) hours as needed for diarrhea or loose stools.   metoprolol  succinate (TOPROL -XL) 50 MG 24 hr tablet TAKE  1 TABLET BY MOUTH  DAILY (Patient taking differently: Take 50 mg by mouth daily.)   montelukast  (SINGULAIR ) 10 MG tablet Take 10 mg by mouth at bedtime.   nystatin cream (MYCOSTATIN) Apply 1 Application topically 2 (two) times daily as needed for dry skin.   omeprazole  (PRILOSEC) 20 MG capsule TAKE 1 CAPSULE BY MOUTH  DAILY (Patient taking differently: Take 20 mg by mouth daily.)   OneTouch Delica Lancets 33G MISC Use as directed to check blood sugars 2 times per day dx: e11.65   pravastatin  (PRAVACHOL ) 40 MG tablet TAKE 1 TABLET BY MOUTH EVERY DAY (Patient taking  differently: Take 80 mg by mouth daily.)   telmisartan  (MICARDIS ) 80 MG tablet TAKE 1 TABLET BY MOUTH  DAILY (Patient taking differently: Take 80 mg by mouth daily.)   triamcinolone  (KENALOG ) 0.1 % APPLY TO AFFECTED AREA TWICE A DAY (Patient taking differently: Apply 1 Application topically 2 (two) times daily as needed (rash).)   No current facility-administered medications for this visit. (Other)   REVIEW OF SYSTEMS: ROS   Positive for: Skin, Endocrine, Cardiovascular, Eyes Negative for: Constitutional, Gastrointestinal, Neurological, Genitourinary, Musculoskeletal, HENT, Respiratory, Psychiatric, Allergic/Imm, Heme/Lymph Last edited by Carrington Clack, COT on 05/30/2023  8:52 AM.     ALLERGIES No Known Allergies  PAST MEDICAL HISTORY Past Medical History:  Diagnosis Date   Arthritis    osteoarthritis   Cataract    OS   Diabetes mellitus    Insulin  dependent   Diabetic retinopathy (HCC)    NPDR OU   Glaucoma    POAG OD   H/O cardiovascular stress test    pt. reports 10/13- was normal per pt.    Hyperlipemia    Hypertension    Hypertensive retinopathy    OU   Past Surgical History:  Procedure Laterality Date   CARPAL TUNNEL RELEASE Bilateral    CATARACT EXTRACTION Right 10/05/2018   Dr. Reyne Cave   EYE SURGERY Right    Cat Sx   KNEE ARTHROSCOPY     right   QUADRICEPS TENDON REPAIR  02/17/2012   Procedure: REPAIR QUADRICEP TENDON;  Surgeon: Christie Cox, MD;  Location: [facility] OR;  Service: Orthopedics;  Laterality: Right;  right quadriceps repair with latera release   QUADRICEPS TENDON REPAIR Right 05/15/2012   Procedure: REPAIR QUADRICEP TENDON;  Surgeon: Christie Cox, MD;  Location: [facility] OR;  Service: Orthopedics;  Laterality: Right;   TOTAL KNEE ARTHROPLASTY  10/21/2011   Procedure: TOTAL KNEE ARTHROPLASTY;  Surgeon: Florencia Hunter, MD;  Location: [facility] OR;  Service: Orthopedics;  Laterality: Right;   TUBAL LIGATION     FAMILY HISTORY Family History  Problem Relation Age  of Onset   Hypertension Mother    Heart Problems Father    Gout Father    Arthritis Father    SOCIAL HISTORY Social History   Tobacco Use   Smoking status: Former    Current packs/day: 0.00    Average packs/day: 1 pack/day for 47.5 years (47.5 ttl pk-yrs)    Types: Cigarettes    Start date: 01/15/1967    Quit date: 06/28/2014    Years since quitting: 8.9   Smokeless tobacco: Never  Vaping Use   Vaping status: Never Used  Substance Use Topics   Alcohol use: No   Drug use: Yes    Types: Hydrocodone        OPHTHALMIC EXAM: Base Eye Exam     Visual Acuity (Snellen - Linear)       Right Left  Dist [place] HM HM   Dist ph [place] NI NI         Tonometry (Tonopen, 8:43 AM)       Right Left   Pressure 21 22         Pupils       Dark Light Shape React APD   Right 2 1 Round Minimal unable to assess   Left 2 2 Round Minimal unable to assess         Visual Fields       Left Right   Restrictions Total superior temporal, inferior temporal, superior nasal, inferior nasal deficiencies Total superior temporal, inferior temporal, superior nasal, inferior nasal deficiencies         Extraocular Movement       Right Left    Full Abnormal    -- -- --  --  --  -- -- --   -4 -- --  -4  --  -4 -- --           Neuro/Psych     Oriented x3: Yes   Mood/Affect: Normal         Dilation     Both eyes: 1.0% Mydriacyl, 2.5% Phenylephrine  @ 8:44 AM           Slit Lamp and Fundus Exam     Slit Lamp Exam       Right Left   Lids/Lashes Dermatochalasis - upper lid, mild Meibomian gland dysfunction Dermatochalasis - upper lid, mild Meibomian gland dysfunction   Conjunctiva/Sclera Melanosis Melanosis   Cornea Trace Punctate epithelial erosions, well healed temporal cataract wounds, arcus, trace EBMD, Descemet's folds, microcystic edema and mild diffused haze Mild arcus, trace PEE   Anterior Chamber Deep and quiet Deep and quiet   Iris Round and poorly dilated to  3.27mm Round and moderately dilated to 6mm   Lens PC IOL in good position, trace, cortical remnant temporally, PC folds 3+ Nuclear sclerosis with brunescense, 3+ Cortical cataract   Anterior Vitreous Vitreous syneresis, mild blood stained vitreous condensations -- now white and fading Vitreous syneresis         Fundus Exam       Right Left   Disc Hazy view, 2-3+Pallor, Sharp rim, temporal PPP/PPA, thin superior rim, +cupping, vascular loops, no heme Mild Pallor, Sharp rim, PPP, severely attenuated vessels   C/D Ratio 0.9 0.3   Macula Hazy view, Blunted foveal reflex, Drusen, Retinal pigment epithelial mottling, punctate exudates - improved, scattered DBH greatest inferior macula -- improved, persistent cystic changes / edema centrally Flat, Blunted foveal reflex, RPE mottling and clumping, scarring, atrophy, no heme   Vessels attenuated, Tortuous Severe Vascular attenuation, +fibrosis   Periphery Attached, scattered DBH Attached, pavingstone and reticular degeneration inferiorly, scattered RPE atrophy, greatest peripapillary           IMAGING AND PROCEDURES  Imaging and Procedures for @TODAY @  OCT, Retina - OU - Both Eyes        Right Eye Quality was poor. Central Foveal Thickness: 898. Progression has worsened. Findings include no SRF, abnormal foveal contour, intraretinal hyper-reflective material, intraretinal fluid, vitreomacular adhesion (Very hazy images, persistent IRF/IRHM ).   Left Eye Quality was borderline. Central Foveal Thickness: 218. Progression has been stable. Findings include no SRF, abnormal foveal contour, subretinal hyper-reflective material, epiretinal membrane, intraretinal fluid, pigment epithelial detachment, outer retinal atrophy, preretinal fibrosis (Stable improvement in scattered cystic changes, persistent central ORA / SRHM).   Notes  *Images captured and stored on drive  Diagnosis / Impression:  OD: Very hazy images, persistent IRF/IRHM  OS:  stable improvement in scattered cystic changes, persistent central ORA / SRHM  Clinical management:  See below  Abbreviations: NFP - Normal foveal profile. CME - cystoid macular edema. PED - pigment epithelial detachment. IRF - intraretinal fluid. SRF - subretinal fluid. EZ - ellipsoid zone. ERM - epiretinal membrane. ORA - outer retinal atrophy. ORT - outer retinal tubulation. SRHM - subretinal hyper-reflective material      Intravitreal Injection, Pharmacologic Agent - OD - Right Eye        Time Out 05/30/2023. 9:41 AM. Confirmed correct patient, procedure, site, and patient consented.   Anesthesia Topical anesthesia was used. Anesthetic medications included Lidocaine  2%, Proparacaine 0.5%.   Procedure Preparation included 5% betadine to ocular surface, eyelid speculum. A supplied (32g) needle was used.   Injection: 6 mg faricimab -svoa 6 MG/0.05ML   Route: Intravitreal, Site: Right Eye   NDC: 40981-191-47, Lot: [id number], Expiration date: 05/13/2024, Waste: 0 mL   Post-op Post injection exam found visual acuity of at least counting fingers, no retinal detachment, perfused optic nerve. The patient tolerated the procedure well. There were no complications. The patient received written and verbal post procedure care education.   Notes  An AC tap was performed following injection due to elevated IOP using a 30 gauge needle on a syringe with the plunger removed. The needle was placed at the limbus at 7 oclock and approximately 0.08cc of aqueous was removed from the anterior chamber. Betadine was applied to the tap area before and after the paracentesis was performed. There were no complications. The patient tolerated the procedure well. The IOP was rechecked and was found to be ~9 mmHg by digital palpation.            ASSESSMENT/PLAN:    ICD-10-CM   1. Central retinal vein occlusion with macular edema of both eyes  H34.8130 OCT, Retina - OU - Both Eyes    2. Severe  nonproliferative diabetic retinopathy of both eyes with macular edema associated with type 2 diabetes mellitus (HCC)  Y86.5784 Intravitreal Injection, Pharmacologic Agent - OD - Right Eye    faricimab -svoa (VABYSMO ) 6mg /0.66mL intravitreal injection    3. Long-term (current) use of injectable non-insulin  antidiabetic drugs  Z79.85     4. Essential hypertension  I10     5. Hypertensive retinopathy of both eyes  H35.033     6. Combined forms of age-related cataract of left eye  H25.812     7. Pseudophakia  Z96.1     8. Primary open angle glaucoma (POAG) of right eye, mild stage  H40.1111       1. CRVO w/ CME OU  - pt of Dr. Augustus Ledger who wished to transfer care - OS long-standing low vision (CF) with subfoveal scar and central retinal atrophy - OD with CME that was actively being managed with Ozurdex  OD ~q5 wks by Dr. Augustus Ledger (last on 8.20.20) s/p multiple IVTA and Ozurdex  OD - was due for Ozurdex  OD w/ Augustus Ledger on 9.25.20, but underwent cataract surgery OD w/ Reyne Cave on 9.21.20 - s/p IVTA OD #4 (10.26.20), #5 (12.10.20), #6 (01.21.21), #7 (03.05.21), #8 (04.16.21), #9 (05.28.21), #10 (07.09.21), #11 (09.14.21), #12 (10.29.21), #13 (12.17.21), #14 (01.27.22), #15 (03.11.2022), #16 (05.04.22), #17 (06.17.22), #18 (07.29.22), #19 (09.16.22), #20 (10.28.22), #21 (12.16.22), #22 (02.03.23) #23 (04.07.23), #24 (06.14.23) ====================================== - s/p IVA OD #1 (07.17.23), #2 (08.14.23), #3 (09.14.23), #4 (10.18.23) #5 (12.01.24) ====================================== - s/p IVE OD #1 (01.26.24), #2 (  02.23.24), #3 (03.29.24), #4 (04.26.24), #5 (05.24.24) ====================================== -s/p IVV OD #1 (07.03.24), #2 (08.30.24), #3 (10.15.24), #4 (11.15.24), #5 (12.17.24), #6 (01.14.25), #7 (03.21.25) ======================================= - s/p IVE HD sample OD #1 (04.18.2025)  - BCVA OD is stable at [facility] - exam shows mild improvement in blood stained vit condensations --  white and fading - OCT shows OD: Very hazy images, persistent IRF/IRHM , OS: Stable improvement in scattered cystic changes, persistent central ORA / SRHM at 4 wks - recommend IVV OD #8 today, 05.16.25, w/ f/u in 6 wks  - pt wishes to proceed with injection  - RBA of procedure discussed, questions answered - see procedure note -- AC tap performed post injxn - IVV informed consent obtained and signed, 07.03.24 (OD) - PACE approved Vabysmo  OD  - f/u 6 weeks -- DFE/OCT/possible injection  2,3. Severe Non-proliferative diabetic retinopathy OU.  - no OCT obtained today   - s/p IVTA OD on 06.14.23 as above  - s/p IVA OD #1 on 07.17.23 as above for DME as well as CRVO  - IVV OD today as above  4,5. Hypertensive retinopathy OU  - discussed importance of tight BP control  - continue to monitor  6. Mixed form age related cataract OS  - under the expert management of [facility]  7. Pseudophakia OD  - s/p CE/IOL OD (9.21.20) by expert surgeon, Dr. Reyne Cave  - IOL in excellent position  - had post op IOP spike -- IOP now under better control  - continue to monitor  8. POAG OD  - now under the expert management of Dr. Leanor Proper (initial consult 07.27.23)  - s/p SLT and goniotomy OD w/ Dr. Reyne Cave  - had post-cataract IOP spike  - on Brimonidine  TID OU and Timolol  BID OU -- no longer using Cosopt   - IOP 21, 22 today  Return in about 6 weeks (around 07/11/2023) for f/u CRVO OU , DFE, OCT, Possible, IVV, OD.   Ophthalmic Meds Ordered this visit:  Meds ordered this encounter  Medications   faricimab -svoa (VABYSMO ) 6mg /0.17mL intravitreal injection    This document serves as a record of services personally performed by Jeanice Millard, MD, PhD. It was created on their behalf by Eller Gut COT, an ophthalmic technician. The creation of this record is the provider's dictation and/or activities during the visit.    Electronically signed by: Eller Gut COT 05.15.2025  12:21  AM  This document serves as a record of services personally performed by Jeanice Millard, MD, PhD. It was created on their behalf by Olene Berne, COT an ophthalmic technician. The creation of this record is the provider's dictation and/or activities during the visit.    Electronically signed by:  Olene Berne, COT  06/04/23 12:21 AM  Jeanice Millard, M.D., Ph.D. Diseases & Surgery of the Retina and Vitreous [facility] 05/30/2023   I have reviewed the above documentation for accuracy and completeness, and I agree with the above. Jeanice Millard, M.D., Ph.D. 06/04/23 12:29 AM    Abbreviations: M myopia (nearsighted); A astigmatism; H hyperopia (farsighted); P presbyopia; Mrx spectacle prescription;  CTL contact lenses; OD right eye; OS left eye; OU both eyes  XT exotropia; ET esotropia; PEK punctate epithelial keratitis; PEE punctate epithelial erosions; DES dry eye syndrome; MGD meibomian gland dysfunction; ATs artificial tears; PFAT's preservative free artificial tears; NSC nuclear sclerotic cataract; PSC posterior subcapsular cataract; ERM epi-retinal membrane; PVD posterior vitreous detachment; RD retinal detachment;  DM diabetes mellitus; DR diabetic retinopathy; NPDR non-proliferative diabetic retinopathy; PDR proliferative diabetic retinopathy; CSME clinically significant macular edema; DME diabetic macular edema; dbh dot blot hemorrhages; CWS cotton wool spot; POAG primary open angle glaucoma; C/D cup-to-disc ratio; HVF humphrey visual field; GVF goldmann visual field; OCT optical coherence tomography; IOP intraocular pressure; BRVO Branch retinal vein occlusion; CRVO central retinal vein occlusion; CRAO central retinal artery occlusion; BRAO branch retinal artery occlusion; RT retinal tear; SB scleral buckle; PPV pars plana vitrectomy; VH Vitreous hemorrhage; PRP panretinal laser photocoagulation; IVK intravitreal kenalog ; VMT vitreomacular traction; MH  Macular hole;  NVD neovascularization of the disc; NVE neovascularization elsewhere; AREDS age related eye disease study; ARMD age related macular degeneration; POAG primary open angle glaucoma; EBMD epithelial/anterior basement membrane dystrophy; ACIOL anterior chamber intraocular lens; IOL intraocular lens; PCIOL posterior chamber intraocular lens; Phaco/IOL phacoemulsification with intraocular lens placement; PRK photorefractive keratectomy; LASIK laser assisted in situ keratomileusis; HTN hypertension; DM diabetes mellitus; COPD chronic obstructive pulmonary disease

## 2023-05-30 ENCOUNTER — Encounter (INDEPENDENT_AMBULATORY_CARE_PROVIDER_SITE_OTHER): Payer: Self-pay | Admitting: Ophthalmology

## 2023-05-30 ENCOUNTER — Ambulatory Visit (INDEPENDENT_AMBULATORY_CARE_PROVIDER_SITE_OTHER): Payer: Medicare (Managed Care) | Admitting: Ophthalmology

## 2023-05-30 DIAGNOSIS — H401111 Primary open-angle glaucoma, right eye, mild stage: Secondary | ICD-10-CM

## 2023-05-30 DIAGNOSIS — H34813 Central retinal vein occlusion, bilateral, with macular edema: Secondary | ICD-10-CM | POA: Diagnosis not present

## 2023-05-30 DIAGNOSIS — I10 Essential (primary) hypertension: Secondary | ICD-10-CM | POA: Diagnosis not present

## 2023-05-30 DIAGNOSIS — H35033 Hypertensive retinopathy, bilateral: Secondary | ICD-10-CM

## 2023-05-30 DIAGNOSIS — E113413 Type 2 diabetes mellitus with severe nonproliferative diabetic retinopathy with macular edema, bilateral: Secondary | ICD-10-CM

## 2023-05-30 DIAGNOSIS — Z961 Presence of intraocular lens: Secondary | ICD-10-CM

## 2023-05-30 DIAGNOSIS — Z7985 Long-term (current) use of injectable non-insulin antidiabetic drugs: Secondary | ICD-10-CM | POA: Diagnosis not present

## 2023-05-30 DIAGNOSIS — H25812 Combined forms of age-related cataract, left eye: Secondary | ICD-10-CM

## 2023-05-30 MED ORDER — FARICIMAB-SVOA 6 MG/0.05ML IZ SOSY
6.0000 mg | PREFILLED_SYRINGE | INTRAVITREAL | Status: AC | PRN
  Administered 2023-05-30: 6 mg via INTRAVITREAL

## 2023-07-01 ENCOUNTER — Encounter (HOSPITAL_COMMUNITY): Payer: Self-pay

## 2023-07-01 ENCOUNTER — Inpatient Hospital Stay (HOSPITAL_COMMUNITY)
Admission: EM | Admit: 2023-07-01 | Discharge: 2023-07-11 | DRG: 871 | Disposition: A | Discharge status: Hospice | Payer: Medicare (Managed Care) | Source: Skilled Nursing Facility | Attending: Student | Admitting: Student

## 2023-07-01 ENCOUNTER — Emergency Department (HOSPITAL_COMMUNITY): Payer: Medicare (Managed Care)

## 2023-07-01 ENCOUNTER — Other Ambulatory Visit: Payer: Self-pay

## 2023-07-01 DIAGNOSIS — E869 Volume depletion, unspecified: Secondary | ICD-10-CM | POA: Diagnosis present

## 2023-07-01 DIAGNOSIS — J122 Parainfluenza virus pneumonia: Secondary | ICD-10-CM | POA: Diagnosis present

## 2023-07-01 DIAGNOSIS — L89616 Pressure-induced deep tissue damage of right heel: Secondary | ICD-10-CM | POA: Diagnosis present

## 2023-07-01 DIAGNOSIS — R0602 Shortness of breath: Secondary | ICD-10-CM | POA: Diagnosis not present

## 2023-07-01 DIAGNOSIS — Z6841 Body Mass Index (BMI) 40.0 and over, adult: Secondary | ICD-10-CM | POA: Diagnosis not present

## 2023-07-01 DIAGNOSIS — I5040 Unspecified combined systolic (congestive) and diastolic (congestive) heart failure: Secondary | ICD-10-CM | POA: Diagnosis not present

## 2023-07-01 DIAGNOSIS — Z79899 Other long term (current) drug therapy: Secondary | ICD-10-CM

## 2023-07-01 DIAGNOSIS — T17890A Other foreign object in other parts of respiratory tract causing asphyxiation, initial encounter: Secondary | ICD-10-CM | POA: Diagnosis present

## 2023-07-01 DIAGNOSIS — J440 Chronic obstructive pulmonary disease with acute lower respiratory infection: Secondary | ICD-10-CM | POA: Diagnosis present

## 2023-07-01 DIAGNOSIS — B9689 Other specified bacterial agents as the cause of diseases classified elsewhere: Secondary | ICD-10-CM | POA: Diagnosis present

## 2023-07-01 DIAGNOSIS — R578 Other shock: Secondary | ICD-10-CM | POA: Diagnosis not present

## 2023-07-01 DIAGNOSIS — E872 Acidosis, unspecified: Secondary | ICD-10-CM | POA: Diagnosis present

## 2023-07-01 DIAGNOSIS — L89153 Pressure ulcer of sacral region, stage 3: Secondary | ICD-10-CM | POA: Diagnosis present

## 2023-07-01 DIAGNOSIS — J159 Unspecified bacterial pneumonia: Secondary | ICD-10-CM | POA: Diagnosis present

## 2023-07-01 DIAGNOSIS — I130 Hypertensive heart and chronic kidney disease with heart failure and stage 1 through stage 4 chronic kidney disease, or unspecified chronic kidney disease: Secondary | ICD-10-CM | POA: Diagnosis present

## 2023-07-01 DIAGNOSIS — W19XXXA Unspecified fall, initial encounter: Secondary | ICD-10-CM | POA: Diagnosis present

## 2023-07-01 DIAGNOSIS — N390 Urinary tract infection, site not specified: Secondary | ICD-10-CM

## 2023-07-01 DIAGNOSIS — E1122 Type 2 diabetes mellitus with diabetic chronic kidney disease: Secondary | ICD-10-CM | POA: Diagnosis present

## 2023-07-01 DIAGNOSIS — A419 Sepsis, unspecified organism: Principal | ICD-10-CM | POA: Diagnosis present

## 2023-07-01 DIAGNOSIS — D631 Anemia in chronic kidney disease: Secondary | ICD-10-CM | POA: Diagnosis present

## 2023-07-01 DIAGNOSIS — N170 Acute kidney failure with tubular necrosis: Secondary | ICD-10-CM | POA: Diagnosis not present

## 2023-07-01 DIAGNOSIS — L8990 Pressure ulcer of unspecified site, unspecified stage: Secondary | ICD-10-CM | POA: Insufficient documentation

## 2023-07-01 DIAGNOSIS — R918 Other nonspecific abnormal finding of lung field: Secondary | ICD-10-CM | POA: Diagnosis present

## 2023-07-01 DIAGNOSIS — Z87891 Personal history of nicotine dependence: Secondary | ICD-10-CM

## 2023-07-01 DIAGNOSIS — Z7189 Other specified counseling: Secondary | ICD-10-CM | POA: Diagnosis not present

## 2023-07-01 DIAGNOSIS — Z7984 Long term (current) use of oral hypoglycemic drugs: Secondary | ICD-10-CM

## 2023-07-01 DIAGNOSIS — Z7401 Bed confinement status: Secondary | ICD-10-CM

## 2023-07-01 DIAGNOSIS — I5042 Chronic combined systolic (congestive) and diastolic (congestive) heart failure: Secondary | ICD-10-CM | POA: Diagnosis present

## 2023-07-01 DIAGNOSIS — Z79890 Hormone replacement therapy (postmenopausal): Secondary | ICD-10-CM

## 2023-07-01 DIAGNOSIS — N179 Acute kidney failure, unspecified: Secondary | ICD-10-CM | POA: Diagnosis not present

## 2023-07-01 DIAGNOSIS — Z515 Encounter for palliative care: Secondary | ICD-10-CM | POA: Diagnosis not present

## 2023-07-01 DIAGNOSIS — N184 Chronic kidney disease, stage 4 (severe): Secondary | ICD-10-CM | POA: Diagnosis present

## 2023-07-01 DIAGNOSIS — E871 Hypo-osmolality and hyponatremia: Secondary | ICD-10-CM | POA: Diagnosis present

## 2023-07-01 DIAGNOSIS — E11319 Type 2 diabetes mellitus with unspecified diabetic retinopathy without macular edema: Secondary | ICD-10-CM | POA: Diagnosis present

## 2023-07-01 DIAGNOSIS — N289 Disorder of kidney and ureter, unspecified: Secondary | ICD-10-CM | POA: Diagnosis not present

## 2023-07-01 DIAGNOSIS — J189 Pneumonia, unspecified organism: Secondary | ICD-10-CM | POA: Diagnosis not present

## 2023-07-01 DIAGNOSIS — G8929 Other chronic pain: Secondary | ICD-10-CM | POA: Diagnosis present

## 2023-07-01 DIAGNOSIS — L89322 Pressure ulcer of left buttock, stage 2: Secondary | ICD-10-CM | POA: Diagnosis present

## 2023-07-01 DIAGNOSIS — R7989 Other specified abnormal findings of blood chemistry: Secondary | ICD-10-CM | POA: Diagnosis present

## 2023-07-01 DIAGNOSIS — R55 Syncope and collapse: Secondary | ICD-10-CM | POA: Diagnosis present

## 2023-07-01 DIAGNOSIS — J9601 Acute respiratory failure with hypoxia: Secondary | ICD-10-CM | POA: Diagnosis present

## 2023-07-01 DIAGNOSIS — E875 Hyperkalemia: Secondary | ICD-10-CM | POA: Diagnosis present

## 2023-07-01 DIAGNOSIS — W06XXXA Fall from bed, initial encounter: Secondary | ICD-10-CM | POA: Diagnosis present

## 2023-07-01 DIAGNOSIS — Z66 Do not resuscitate: Secondary | ICD-10-CM | POA: Diagnosis present

## 2023-07-01 DIAGNOSIS — S301XXA Contusion of abdominal wall, initial encounter: Secondary | ICD-10-CM | POA: Diagnosis present

## 2023-07-01 DIAGNOSIS — R579 Shock, unspecified: Secondary | ICD-10-CM | POA: Diagnosis not present

## 2023-07-01 DIAGNOSIS — E878 Other disorders of electrolyte and fluid balance, not elsewhere classified: Secondary | ICD-10-CM | POA: Diagnosis not present

## 2023-07-01 DIAGNOSIS — E039 Hypothyroidism, unspecified: Secondary | ICD-10-CM | POA: Diagnosis present

## 2023-07-01 DIAGNOSIS — R6521 Severe sepsis with septic shock: Secondary | ICD-10-CM | POA: Diagnosis present

## 2023-07-01 DIAGNOSIS — R34 Anuria and oliguria: Secondary | ICD-10-CM | POA: Diagnosis not present

## 2023-07-01 DIAGNOSIS — I959 Hypotension, unspecified: Secondary | ICD-10-CM

## 2023-07-01 DIAGNOSIS — I132 Hypertensive heart and chronic kidney disease with heart failure and with stage 5 chronic kidney disease, or end stage renal disease: Secondary | ICD-10-CM | POA: Diagnosis not present

## 2023-07-01 DIAGNOSIS — H35039 Hypertensive retinopathy, unspecified eye: Secondary | ICD-10-CM | POA: Diagnosis present

## 2023-07-01 DIAGNOSIS — R5381 Other malaise: Secondary | ICD-10-CM | POA: Diagnosis present

## 2023-07-01 DIAGNOSIS — M79605 Pain in left leg: Secondary | ICD-10-CM | POA: Diagnosis not present

## 2023-07-01 DIAGNOSIS — E785 Hyperlipidemia, unspecified: Secondary | ICD-10-CM | POA: Diagnosis present

## 2023-07-01 DIAGNOSIS — L89626 Pressure-induced deep tissue damage of left heel: Secondary | ICD-10-CM | POA: Diagnosis present

## 2023-07-01 DIAGNOSIS — I890 Lymphedema, not elsewhere classified: Secondary | ICD-10-CM | POA: Diagnosis present

## 2023-07-01 DIAGNOSIS — Z7982 Long term (current) use of aspirin: Secondary | ICD-10-CM

## 2023-07-01 DIAGNOSIS — R627 Adult failure to thrive: Secondary | ICD-10-CM | POA: Diagnosis present

## 2023-07-01 LAB — COMPREHENSIVE METABOLIC PANEL WITH GFR
ALT: 31 U/L (ref 0–44)
AST: 35 U/L (ref 15–41)
Albumin: 2.5 g/dL — ABNORMAL LOW (ref 3.5–5.0)
Alkaline Phosphatase: 296 U/L — ABNORMAL HIGH (ref 38–126)
Anion gap: 15 (ref 5–15)
BUN: 102 mg/dL — ABNORMAL HIGH (ref 8–23)
CO2: 22 mmol/L (ref 22–32)
Calcium: 8.6 mg/dL — ABNORMAL LOW (ref 8.9–10.3)
Chloride: 96 mmol/L — ABNORMAL LOW (ref 98–111)
Creatinine, Ser: 4.37 mg/dL — ABNORMAL HIGH (ref 0.44–1.00)
GFR, Estimated: 10 mL/min — ABNORMAL LOW (ref >60)
Glucose, Bld: 134 mg/dL — ABNORMAL HIGH (ref 70–99)
Potassium: 4.9 mmol/L (ref 3.5–5.1)
Sodium: 133 mmol/L — ABNORMAL LOW (ref 135–145)
Total Bilirubin: 1.8 mg/dL — ABNORMAL HIGH (ref 0.0–1.2)
Total Protein: 6.3 g/dL — ABNORMAL LOW (ref 6.5–8.1)

## 2023-07-01 LAB — I-STAT CHEM 8, ED
BUN: 98 mg/dL — ABNORMAL HIGH (ref 8–23)
Calcium, Ion: 0.99 mmol/L — ABNORMAL LOW (ref 1.15–1.40)
Chloride: 97 mmol/L — ABNORMAL LOW (ref 98–111)
Creatinine, Ser: 4.2 mg/dL — ABNORMAL HIGH (ref 0.44–1.00)
Glucose, Bld: 127 mg/dL — ABNORMAL HIGH (ref 70–99)
HCT: 38 % (ref 36.0–46.0)
Hemoglobin: 12.9 g/dL (ref 12.0–15.0)
Potassium: 4.8 mmol/L (ref 3.5–5.1)
Sodium: 133 mmol/L — ABNORMAL LOW (ref 135–145)
TCO2: 23 mmol/L (ref 22–32)

## 2023-07-01 LAB — GLUCOSE, CAPILLARY
Glucose-Capillary: 147 mg/dL — ABNORMAL HIGH (ref 70–99)
Glucose-Capillary: 180 mg/dL — ABNORMAL HIGH (ref 70–99)
Glucose-Capillary: 146 mg/dL — ABNORMAL HIGH (ref 70–99)

## 2023-07-01 LAB — URINALYSIS, ROUTINE W REFLEX MICROSCOPIC
Bilirubin Urine: NEGATIVE
Glucose, UA: NEGATIVE mg/dL
Ketones, ur: NEGATIVE mg/dL
Nitrite: NEGATIVE
Protein, ur: 100 mg/dL — AB
RBC / HPF: >50 RBC/hpf (ref 0–5)
Specific Gravity, Urine: 1.011 (ref 1.005–1.030)
WBC, UA: >50 WBC/hpf (ref 0–5)
pH: 5 (ref 5.0–8.0)

## 2023-07-01 LAB — CBC
HCT: 36 % (ref 36.0–46.0)
Hemoglobin: 11.4 g/dL — ABNORMAL LOW (ref 12.0–15.0)
MCH: 29.4 pg (ref 26.0–34.0)
MCHC: 31.7 g/dL (ref 30.0–36.0)
MCV: 92.8 fL (ref 80.0–100.0)
Platelets: 380 10*3/uL (ref 150–400)
RBC: 3.88 MIL/uL (ref 3.87–5.11)
RDW: 16.4 % — ABNORMAL HIGH (ref 11.5–15.5)
WBC: 12.1 10*3/uL — ABNORMAL HIGH (ref 4.0–10.5)
nRBC: 0.7 % — ABNORMAL HIGH (ref 0.0–0.2)

## 2023-07-01 LAB — ETHANOL: Alcohol, Ethyl (B): <15 mg/dL (ref <15)

## 2023-07-01 LAB — I-STAT CG4 LACTIC ACID, ED: Lactic Acid, Venous: 3.8 mmol/L (ref 0.5–1.9)

## 2023-07-01 LAB — PREPARE RBC (CROSSMATCH)

## 2023-07-01 LAB — PROTIME-INR
INR: 1.1 (ref 0.8–1.2)
Prothrombin Time: 14.7 s (ref 11.4–15.2)

## 2023-07-01 LAB — TROPONIN I (HIGH SENSITIVITY)
Troponin I (High Sensitivity): 1002 ng/L (ref <18)
Troponin I (High Sensitivity): 1201 ng/L (ref <18)

## 2023-07-01 LAB — MAGNESIUM: Magnesium: 2.6 mg/dL — ABNORMAL HIGH (ref 1.7–2.4)

## 2023-07-01 LAB — HEMOGLOBIN A1C
Hgb A1c MFr Bld: 4.8 % (ref 4.8–5.6)
Mean Plasma Glucose: 91.06 mg/dL

## 2023-07-01 LAB — LACTIC ACID, PLASMA: Lactic Acid, Venous: 2.7 mmol/L (ref 0.5–1.9)

## 2023-07-01 LAB — TSH: TSH: 2.762 u[IU]/mL (ref 0.350–4.500)

## 2023-07-01 LAB — ABO/RH: ABO/RH(D): O POS

## 2023-07-01 LAB — MRSA NEXT GEN BY PCR, NASAL: MRSA by PCR Next Gen: DETECTED — AB

## 2023-07-01 MED ORDER — GUAIFENESIN 200 MG PO TABS
600.0000 mg | ORAL_TABLET | Freq: Two times a day (BID) | ORAL | Status: DC
  Administered 2023-07-01 – 2023-07-11 (×19): 600 mg via ORAL
  Filled 2023-07-01 (×23): qty 3

## 2023-07-01 MED ORDER — VANCOMYCIN VARIABLE DOSE PER UNSTABLE RENAL FUNCTION (PHARMACIST DOSING): Status: DC

## 2023-07-01 MED ORDER — OXYCODONE HCL ER 10 MG PO T12A: 10.0000 mg | EXTENDED_RELEASE_TABLET | Freq: Two times a day (BID) | ORAL | Status: DC | PRN

## 2023-07-01 MED ORDER — IPRATROPIUM-ALBUTEROL 0.5-2.5 (3) MG/3ML IN SOLN: 3.0000 mL | RESPIRATORY_TRACT | Status: DC | PRN

## 2023-07-01 MED ORDER — TIMOLOL MALEATE 0.5 % OP SOLN
1.0000 [drp] | Freq: Two times a day (BID) | OPHTHALMIC | Status: DC
  Administered 2023-07-01 – 2023-07-11 (×20): 1 [drp] via OPHTHALMIC
  Filled 2023-07-01: qty 5

## 2023-07-01 MED ORDER — SODIUM CHLORIDE 0.9 % IV BOLUS
1000.0000 mL | Freq: Once | INTRAVENOUS | Status: AC
  Administered 2023-07-01: 1000 mL via INTRAVENOUS

## 2023-07-01 MED ORDER — IOHEXOL 350 MG/ML SOLN
75.0000 mL | Freq: Once | INTRAVENOUS | Status: AC | PRN
  Administered 2023-07-01: 75 mL via INTRAVENOUS

## 2023-07-01 MED ORDER — VANCOMYCIN HCL IN DEXTROSE 1-5 GM/200ML-% IV SOLN: 1000.0000 mg | Freq: Once | INTRAVENOUS | Status: DC

## 2023-07-01 MED ORDER — CALCIUM GLUCONATE-NACL 1-0.675 GM/50ML-% IV SOLN
1.0000 g | Freq: Once | INTRAVENOUS | Status: AC
  Administered 2023-07-01: 1000 mg via INTRAVENOUS
  Filled 2023-07-01: qty 50

## 2023-07-01 MED ORDER — WHITE PETROLATUM EX OINT
TOPICAL_OINTMENT | CUTANEOUS | Status: DC | PRN
  Filled 2023-07-01: qty 28.35

## 2023-07-01 MED ORDER — INSULIN ASPART 100 UNIT/ML IJ SOLN
0.0000 [IU] | INTRAMUSCULAR | Status: DC
  Administered 2023-07-01: 2 [IU] via SUBCUTANEOUS
  Administered 2023-07-01 – 2023-07-02 (×3): 1 [IU] via SUBCUTANEOUS

## 2023-07-01 MED ORDER — BRIMONIDINE TARTRATE 0.2 % OP SOLN
1.0000 [drp] | Freq: Three times a day (TID) | OPHTHALMIC | Status: DC
  Administered 2023-07-01 – 2023-07-11 (×30): 1 [drp] via OPHTHALMIC
  Filled 2023-07-01: qty 5

## 2023-07-01 MED ORDER — CHLORHEXIDINE GLUCONATE CLOTH 2 % EX PADS
6.0000 | MEDICATED_PAD | Freq: Every day | CUTANEOUS | Status: DC
  Administered 2023-07-01 – 2023-07-11 (×11): 6 via TOPICAL

## 2023-07-01 MED ORDER — SODIUM CHLORIDE 0.9 % IV SOLN
250.0000 mL | INTRAVENOUS | Status: DC
  Administered 2023-07-01: 250 mL via INTRAVENOUS

## 2023-07-01 MED ORDER — LACTATED RINGERS IV SOLN: INTRAVENOUS | Status: DC

## 2023-07-01 MED ORDER — SODIUM CHLORIDE 0.9 % IV SOLN
2.0000 g | Freq: Once | INTRAVENOUS | Status: AC
  Administered 2023-07-01: 2 g via INTRAVENOUS
  Filled 2023-07-01: qty 12.5

## 2023-07-01 MED ORDER — NOREPINEPHRINE 4 MG/250ML-% IV SOLN
0.0000 ug/min | INTRAVENOUS | Status: DC
  Administered 2023-07-01: 5 ug/min via INTRAVENOUS
  Administered 2023-07-01: 10 ug/min via INTRAVENOUS
  Administered 2023-07-02 (×2): 5 ug/min via INTRAVENOUS
  Filled 2023-07-01 (×4): qty 250

## 2023-07-01 MED ORDER — ACETAMINOPHEN 325 MG PO TABS
650.0000 mg | ORAL_TABLET | Freq: Four times a day (QID) | ORAL | Status: DC | PRN
  Administered 2023-07-01 – 2023-07-07 (×4): 650 mg via ORAL
  Filled 2023-07-01 (×5): qty 2

## 2023-07-01 MED ORDER — PIPERACILLIN-TAZOBACTAM IN DEX 2-0.25 GM/50ML IV SOLN
2.2500 g | Freq: Three times a day (TID) | INTRAVENOUS | Status: AC
  Administered 2023-07-02 – 2023-07-07 (×19): 2.25 g via INTRAVENOUS
  Filled 2023-07-01 (×22): qty 50

## 2023-07-01 MED ORDER — POLYVINYL ALCOHOL 1.4 % OP SOLN
1.0000 [drp] | Freq: Two times a day (BID) | OPHTHALMIC | Status: DC | PRN
  Administered 2023-07-03 – 2023-07-06 (×3): 1 [drp] via OPHTHALMIC
  Filled 2023-07-01: qty 15

## 2023-07-01 MED ORDER — VANCOMYCIN HCL 2000 MG/400ML IV SOLN
2000.0000 mg | Freq: Once | INTRAVENOUS | Status: AC
  Administered 2023-07-01: 2000 mg via INTRAVENOUS
  Filled 2023-07-01: qty 400

## 2023-07-01 MED ORDER — DOCUSATE SODIUM 100 MG PO CAPS
100.0000 mg | ORAL_CAPSULE | Freq: Two times a day (BID) | ORAL | Status: DC | PRN
  Administered 2023-07-07: 100 mg via ORAL
  Filled 2023-07-01 (×2): qty 1

## 2023-07-01 MED ORDER — OXYCODONE HCL 5 MG PO TABS
5.0000 mg | ORAL_TABLET | Freq: Three times a day (TID) | ORAL | Status: DC | PRN
  Administered 2023-07-01 – 2023-07-02 (×2): 10 mg via ORAL
  Administered 2023-07-02: 5 mg via ORAL
  Administered 2023-07-03 – 2023-07-05 (×6): 10 mg via ORAL
  Administered 2023-07-05: 5 mg via ORAL
  Administered 2023-07-05 – 2023-07-07 (×4): 10 mg via ORAL
  Filled 2023-07-01 (×14): qty 2

## 2023-07-01 MED ORDER — METRONIDAZOLE 500 MG/100ML IV SOLN
500.0000 mg | Freq: Once | INTRAVENOUS | Status: AC
  Administered 2023-07-01: 500 mg via INTRAVENOUS
  Filled 2023-07-01: qty 100

## 2023-07-01 MED ORDER — POLYETHYLENE GLYCOL 3350 17 G PO PACK: 17.0000 g | PACK | Freq: Every day | ORAL | Status: DC | PRN

## 2023-07-01 MED ORDER — ORAL CARE MOUTH RINSE
15.0000 mL | OROMUCOSAL | Status: DC | PRN
Start: 2023-07-01 — End: 2023-07-12

## 2023-07-01 NOTE — ED Notes (Signed)
 Trauma Response Nurse Documentation   Mary [name] is a 78 y.o. female arriving to [facility] ED via EMS  On No antithrombotic. Trauma was activated as a Level 1 by ED Charge RN based on the following trauma criteria Anytime Systolic Blood Pressure < 90. Post fall. Patient cleared for CT by Dr. Paola Pt transported to CT with trauma response nurse present to monitor. RN remained with the patient throughout their absence from the department for clinical observation.   GCS 14.  Trauma MD Arrival Time: 1100 Dr Paola and Mary [name], Mary [name].  History   History reviewed and copied from merged chart.   Date Unknown Arthritis  Date Unknown Cataract  Date Unknown Diabetes mellitus  Date Unknown Diabetic retinopathy (HCC)  Date Unknown Glaucoma  Date Unknown H/O cardiovascular stress test  Date Unknown Hyperlipemia Date Unknown Hypertension Date Unknown Hypertensive retinopathy  Initial Focused Assessment (If applicable, or please see trauma documentation): Airway: Intact, patent Breathing: Breath sounds clear, bilaterally, No CP or SOB. On 2L [place] O2 sat 99%.  Circulation: No signs of external hemorrhage. SBP w/ EMS 83. On arrival, 72's.  HR normal sinus. Extensive pitting edema to BLE 20G PIV to L AC Disability: No C-collar upon arrival to trauma bay.  [place] J applied prior to moving to stretcher.  A/Ox3, GCS 14 which is her baseline. PERRLA.  CT's Completed:   CT Head, CT C-Spine, CT Chest w/ contrast, and CT abdomen/pelvis w/ contrast   Interventions:  *[place] J C-collar applied *1L warmed NS given *1U PRBC given *Multiple attempts at manual BPs - unable to obtain initially  *CXR *Pelvic XR *Pt undressed and assessed *Pt logrolled, no stepoffs or deformities to back *CT pan scan *Levophed  initiated @ 10. *Transitioned to CCM once scans negative for trauma. *FAST deferred due to no abdominal tenderness and body habitus  Plan for disposition:  Admission to ICU with  CCM  Consults completed:  none at 1230.  Event Summary: Pt was BIB EMS after having a witnessed fall.  Pt was pivoting to a chair and fell.  Pt had a syncopal event post fall w/ severe hypotension.  Pt arrives to ED mentating at her baseline.  Pt was trauma workup initially due to mechanism/fall.  Once cleared from trauma standpoint, CCM to admit.    MTP Summary (If applicable): 1U PRBC Emergency Release from blood fridge.   Bedside handoff with ED RN Metta.    LEBRON ROCKIE ORN  Trauma Response RN  Please call TRN at [phone] for further assistance.

## 2023-07-01 NOTE — ED Provider Notes (Signed)
 Emergency Department Provider Note   I have reviewed the triage vital signs and the nursing notes.   HISTORY  Chief Complaint Fall   HPI Mary [name] is a 78 y.o. female past history reviewed below presents emergency department for evaluation of ground-level fall followed by an episode of syncope while supine.  Patient from an assisted living facility.  She has chronic lymphedema and tells me that staff are trying to get her up and out of bed today.  She says they were pushing on me trying to get me up and she proceeded to have a witnessed fall to the ground.  While lying there she then had a brief episode of unresponsiveness/syncope.  EMS found her to have blood pressure in the 60s.  They established IV access and transported to the ED.  Patient was activated as a level 1 trauma due to fall and hypotension.  Patient has no pain complaints at this time.  She specifically denies any chest pain, trouble breathing, abdominal pain. No HA. She is DNR.    History reviewed. No pertinent past medical history.  Review of Systems  Constitutional: No fever/chills. Positive generalized weakness.  Cardiovascular: Denies chest pain. Respiratory: Denies shortness of breath. Positive productive cough.  Gastrointestinal: No abdominal pain.  No nausea, no vomiting.   Genitourinary: Negative for dysuria. Musculoskeletal: Negative for back pain. Skin: Negative for rash. Neurological: Negative for headaches, focal weakness or numbness.   ____________________________________________   PHYSICAL EXAM:  VITAL SIGNS: Vitals:   07/02/23 0730 07/02/23 0800  BP:  (!) 99/53  Pulse: 82 83  Resp: 16 (!) 23  Temp:  98.2 F (36.8 C)  SpO2: 92% 97%   Constitutional: Alert and oriented.  Eyes: Conjunctivae are normal.  Head: Atraumatic. Nose: No congestion/rhinnorhea. Mouth/Throat: Mucous membranes are moist. Neck: No stridor. C collar in place.  Cardiovascular: Normal rate, regular rhythm.  Good peripheral circulation. Grossly normal heart sounds.   Respiratory: Normal respiratory effort.  No retractions. Lungs CTAB. Gastrointestinal: Soft and nontender. No distention.  Musculoskeletal: No lower extremity tenderness nor edema. No gross deformities of extremities. Neurologic:  Normal speech and language. No gross focal neurologic deficits are appreciated.  Skin:  Skin is warm, dry and intact. No rash noted.   ____________________________________________   LABS (all labs ordered are listed, but only abnormal results are displayed)  Labs Reviewed  MRSA NEXT GEN BY PCR, NASAL - Abnormal; Notable for the following components:      Result Value   MRSA by PCR Next Gen DETECTED (*)    All other components within normal limits  RESPIRATORY PANEL BY PCR - Abnormal; Notable for the following components:   Parainfluenza Virus 3 DETECTED (*)    All other components within normal limits  COMPREHENSIVE METABOLIC PANEL WITH GFR - Abnormal; Notable for the following components:   Sodium 133 (*)    Chloride 96 (*)    Glucose, Bld 134 (*)    BUN 102 (*)    Creatinine, Ser 4.37 (*)    Calcium  8.6 (*)    Total Protein 6.3 (*)    Albumin  2.5 (*)    Alkaline Phosphatase 296 (*)    Total Bilirubin 1.8 (*)    GFR, Estimated 10 (*)    All other components within normal limits  CBC - Abnormal; Notable for the following components:   WBC 12.1 (*)    Hemoglobin 11.4 (*)    RDW 16.4 (*)    nRBC 0.7 (*)  All other components within normal limits  URINALYSIS, ROUTINE W REFLEX MICROSCOPIC - Abnormal; Notable for the following components:   Color, Urine AMBER (*)    APPearance TURBID (*)    Hgb urine dipstick LARGE (*)    Protein, ur 100 (*)    Leukocytes,Ua MODERATE (*)    Bacteria, UA MANY (*)    All other components within normal limits  MAGNESIUM - Abnormal; Notable for the following components:   Magnesium 2.6 (*)    All other components within normal limits  LACTIC ACID, PLASMA  - Abnormal; Notable for the following components:   Lactic Acid, Venous 2.7 (*)    All other components within normal limits  CBC - Abnormal; Notable for the following components:   WBC 12.3 (*)    HCT 35.9 (*)    RDW 15.9 (*)    nRBC 0.7 (*)    All other components within normal limits  BASIC METABOLIC PANEL WITH GFR - Abnormal; Notable for the following components:   Sodium 131 (*)    Glucose, Bld 132 (*)    BUN 94 (*)    Creatinine, Ser 3.82 (*)    Calcium  8.2 (*)    GFR, Estimated 12 (*)    All other components within normal limits  MAGNESIUM - Abnormal; Notable for the following components:   Magnesium 2.5 (*)    All other components within normal limits  PHOSPHORUS - Abnormal; Notable for the following components:   Phosphorus 5.4 (*)    All other components within normal limits  HEPATIC FUNCTION PANEL - Abnormal; Notable for the following components:   Total Protein 6.2 (*)    Albumin  2.4 (*)    Alkaline Phosphatase 267 (*)    Total Bilirubin 2.0 (*)    Bilirubin, Direct 0.8 (*)    Indirect Bilirubin 1.2 (*)    All other components within normal limits  GLUCOSE, CAPILLARY - Abnormal; Notable for the following components:   Glucose-Capillary 147 (*)    All other components within normal limits  GLUCOSE, CAPILLARY - Abnormal; Notable for the following components:   Glucose-Capillary 180 (*)    All other components within normal limits  GLUCOSE, CAPILLARY - Abnormal; Notable for the following components:   Glucose-Capillary 146 (*)    All other components within normal limits  GLUCOSE, CAPILLARY - Abnormal; Notable for the following components:   Glucose-Capillary 129 (*)    All other components within normal limits  GLUCOSE, CAPILLARY - Abnormal; Notable for the following components:   Glucose-Capillary 110 (*)    All other components within normal limits  I-STAT CHEM 8, ED - Abnormal; Notable for the following components:   Sodium 133 (*)    Chloride 97 (*)     BUN 98 (*)    Creatinine, Ser 4.20 (*)    Glucose, Bld 127 (*)    Calcium , Ion 0.99 (*)    All other components within normal limits  I-STAT CG4 LACTIC ACID, ED - Abnormal; Notable for the following components:   Lactic Acid, Venous 3.8 (*)    All other components within normal limits  TROPONIN I (HIGH SENSITIVITY) - Abnormal; Notable for the following components:   Troponin I (High Sensitivity) 1,002 (*)    All other components within normal limits  TROPONIN I (HIGH SENSITIVITY) - Abnormal; Notable for the following components:   Troponin I (High Sensitivity) 1,201 (*)    All other components within normal limits  CULTURE, BLOOD (ROUTINE X 2)  CULTURE, BLOOD (ROUTINE X 2)  EXPECTORATED SPUTUM ASSESSMENT W GRAM STAIN, RFLX TO RESP C  URINE CULTURE  ETHANOL  PROTIME-INR  STREP PNEUMONIAE URINARY ANTIGEN  HEMOGLOBIN A1C  TSH  SODIUM, URINE, RANDOM  CREATININE, URINE, RANDOM  LEGIONELLA PNEUMOPHILA SEROGP 1 UR AG  LEGIONELLA PNEUMOPHILA SEROGP 1 UR AG  TYPE AND SCREEN  ABO/RH  PREPARE RBC (CROSSMATCH)   ____________________________________________  EKG   EKG Interpretation Date/Time:  Tuesday July 01 2023 12:37:04 EDT Ventricular Rate:  90 PR Interval:  201 QRS Duration:  112 QT Interval:  398 QTC Calculation: 487 R Axis:   148  Text Interpretation: Sinus rhythm Atrial premature complex Low voltage with right axis deviation Abnormal lateral Q waves Anteroseptal infarct, old Confirmed by Darra Chew [phone]) on 07/02/2023 8:20:01 AM        ____________________________________________  RADIOLOGY  DG Pelvis Portable Result Date: 07/01/2023 CLINICAL DATA:  Pain after fall EXAM: PORTABLE PELVIS 1 VIEWS COMPARISON:  None Available. FINDINGS: Osteopenia. Hyperostosis. Dystrophic area ossification along the area of the left iliopsoas. No acute fracture or dislocation. Mild degenerative changes with joint space loss. Overlapping cardiac leads. Films are under penetrated.  IMPRESSION: Chronic changes.  Osteopenia.  Degenerative changes. Electronically Signed   By: Ranell Bring M.D.   On: 07/01/2023 13:39   DG Chest Port 1 View Result Date: 07/01/2023 CLINICAL DATA:  Pain after fall EXAM: PORTABLE CHEST 1 VIEW COMPARISON:  X-ray 04/19/2023 and older FINDINGS: Enlarged cardiopericardial silhouette. Widened mediastinum with tortuous ectatic aorta, similar to previous. Diffuse interstitial lung changes identified. Subtle areas of opacity suggested. No pneumothorax or effusion. Osteopenia. Degenerative changes. Overlapping cardiac leads. Please see separate CT scan of the chest abdomen and pelvis. IMPRESSION: Enlarged heart with tortuous ectatic aorta. Interstitial lung changes with some subtle lung opacities. Electronically Signed   By: Ranell Bring M.D.   On: 07/01/2023 13:36   CT CHEST ABDOMEN PELVIS W CONTRAST Result Date: 07/01/2023 CLINICAL DATA:  Pain.  Poly trauma.  Fall.  Initially hypotensive EXAM: CT CHEST, ABDOMEN, AND PELVIS WITH CONTRAST TECHNIQUE: Multidetector CT imaging of the chest, abdomen and pelvis was performed following the standard protocol during bolus administration of intravenous contrast. RADIATION DOSE REDUCTION: This exam was performed according to the departmental dose-optimization program which includes automated exposure control, adjustment of the mA and/or kV according to patient size and/or use of iterative reconstruction technique. CONTRAST:  75mL OMNIPAQUE  IOHEXOL  350 MG/ML SOLN COMPARISON:  Noncontrast abdomen pelvis CT of July 2006. FINDINGS: CT CHEST FINDINGS Cardiovascular: Heart is slightly enlarged. No pericardial effusion. Coronary artery calcifications are seen. There is some enlargement of the main pulmonary arteries. Please correlate for pulmonary artery hypertension. The thoracic aorta has a normal course and caliber scattered atherosclerotic calcified plaque. Bovine type aortic arch, a normal variant. Slight pulsation artifact along  the ascending aorta. No mediastinal hematoma. Mediastinum/Nodes: There is a small amount of bandlike soft tissue to left of the aortic arch on image 22. This could relate to some thymus tissue but there is a differential. There is some small mediastinal nodes identified. Precarinal node has short axis of 9 mm on image 24 series 2. Normal caliber thoracic esophagus. No abnormal hilar or axillary node enlargement. Lungs/Pleura: Mild scattered emphysematous lung changes identified. There are areas of interstitial septal thickening with scarring fibrotic changes. Lower lobe bilateral bronchial wall thickening identified with some mucous plugging and parenchymal lung opacities more left lower lobe than right. Multifocal infiltrate is possible. There is also some ill-defined  opacities posteriorly in the upper lobes which are somewhat nodular. These have a differential. Sample right upper lobe nodule focus measures 13 by 7 mm. There is also a larger nodule in the right lower lobe on image 104 series 3 measuring 15 by 13 mm. Aggressive process is possible including malignant lesions. Please correlate for any prior more recent CT scan of the chest or further workup such as PET-CT when clinically appropriate. Musculoskeletal: Osteopenia. Diffuse degenerative changes of the thoracic spine. Is also degenerative changes of the shoulders, sternoclavicular joints. Bridging osteophytes along the thoracic spine are noted. CT ABDOMEN PELVIS FINDINGS Hepatobiliary: Contrast bolus in the abdomen is more arterial phase rather than portal venous phase limiting evaluation of the parenchymal organs. In addition there is streak artifact as the patient's arms were scanned at the patient's side. Grossly no space-occupying liver lesion. Gallbladder is nondilated. Limited evaluation of the portal vein on standard imaging. Delay images demonstrate enhancement of the portal vein. Pancreas: Mild global pancreatic atrophy. Mild pancreatic ductal  dilatation. Spleen: Choose 1 Adrenals/Urinary Tract: Preserved adrenal glands. Mild bilateral renal atrophy. No collecting system dilatation. Numerous bilateral renal cysts are identified the right-sided dominant focus measures 2.8/are and has Hounsfield units of less than 0 consistent with cyst. Dominant focus upper pole left kidney has Hounsfield unit of 3 and diameter of 19 mm. There are several which are under a cm and too small to completely characterize. Preserved contour to the urinary bladder. Slight wall thickening of the bladder. Stomach/Bowel: On this non oral contrast exam, the stomach and small bowel are nondilated. Large bowel has a normal course and caliber with scattered mild colonic stool. Normal appendix in the right lower quadrant. Vascular/Lymphatic: Normal caliber aorta and IVC. Scattered vascular calcifications along the aorta and branch vessels. No specific abnormal lymph node enlargement identified in the abdomen pelvis. Reproductive: Uterus is present.  No separate adnexal mass. Other: Anasarca. Mild mesenteric stranding. There is some presacral soft tissue stranding, nonspecific. Musculoskeletal: Asymmetric thickening along the right rectus abdominus muscle on image 81. This extends for a longitudinal length on sagittal image 116 of 6.2 cm. This could be rectus sheath hematoma. No active extravasation on this examination this location. Scattered degenerative changes of the spine and pelvis. Areas of stenosis in the lumbar spine. Hyperostosis along the left hip. Osteopenia. Critical Value/emergent results were called by telephone at the time of interpretation on 07/01/2023 at 1:08 pm to provider Dr. Paola, who verbally acknowledged these results. IMPRESSION: Asymmetric thickening with mixed density along the right rectus abdominus muscle, possible focal hematoma pain launch in length of 6.2 cm. No bowel obstruction, free air or free fluid. No evidence of solid organ injury. Evaluation  limited due to the contrast bolus being arterial phase and the streak artifact. Anasarca.  Nonspecific presacral fat stranding. Bilateral renal atrophy with numerous small cysts. Enlarged heart. Coronary calcifications. Enlargement of the pulmonary arteries. Please correlate for evidence of pulmonary artery hypertension. Multifocal consolidative patchy ill-defined opacities in the lungs. Multifocal infiltrates possible. Associated lower lobe areas of bronchial wall thickening and mucous plugging. There is also areas of nodularity identified greatest in the right lower lobe but also smaller spiculated right upper lobe lung nodule. Aggressive lesion is possible. Please correlate clinical presentation. Comparison to a recent prior may be of some use. Otherwise would recommend further workup such as PET-CT scan could be performed when clinically appropriate. Bandlike area soft tissue along the anterior mediastinum on left side at the level of the aortic  arch. There is a separate fat tissue plane between this structure and the arch and the arch has normal enhancement. This could represent some residual thymus tissue. There are also some small mediastinal nodes elsewhere. Electronically Signed   By: Ranell Bring M.D.   On: 07/01/2023 13:24   CT CERVICAL SPINE WO CONTRAST Result Date: 07/01/2023 CLINICAL DATA:  78 year old female status post witnessed fall. Initially hypotensive. Trauma. EXAM: CT CERVICAL SPINE WITHOUT CONTRAST TECHNIQUE: Multidetector CT imaging of the cervical spine was performed without intravenous contrast. Multiplanar CT image reconstructions were also generated. RADIATION DOSE REDUCTION: This exam was performed according to the departmental dose-optimization program which includes automated exposure control, adjustment of the mA and/or kV according to patient size and/or use of iterative reconstruction technique. COMPARISON:  Cervical spine CT 04/19/2023.  Head CT today. FINDINGS: Alignment:  Stable since Apr 21, 2023. Mild chronic degenerative appearing retrolisthesis of C4 on C5. Cervicothoracic junction alignment is within normal limits. Bilateral posterior element alignment is within normal limits. Skull base and vertebrae: Bone mineralization is within normal limits for age. Visualized skull base is intact. No atlanto-occipital dissociation. C1 and C2 appear intact and aligned. No acute osseous abnormality identified. Soft tissues and spinal canal: No prevertebral fluid or swelling. No visible canal hematoma. Retropharyngeal carotids, bulky calcified atherosclerosis again noted. Disc levels: Bulky chronic and severe cervical spine degeneration at C4-C5 with severe associated cervical spinal stenosis by CT (sagittal image 44), stable compared to April 21, 2023. Less pronounced degeneration and stenosis at C3-C4, C5-C6. Upper chest: Visible upper thoracic levels appear intact. Small volume thoracic inlet intravenous gas probably is IV access related. Apical lung scarring appears chronic. Chest CT reported separately. IMPRESSION: 1. No acute traumatic injury identified in the cervical spine. 2. 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 Electronically Signed   By: VEAR Hurst M.D.   On: 07/01/2023 11:52   CT Head Wo Contrast Result Date: 07/01/2023 CLINICAL DATA:  78 year old female status post witnessed fall. Initially hypotensive. Trauma. EXAM: CT HEAD WITHOUT CONTRAST TECHNIQUE: Contiguous axial images were obtained from the base of the skull through the vertex without intravenous contrast. RADIATION DOSE REDUCTION: This exam was performed according to the departmental dose-optimization program which includes automated exposure control, adjustment of the mA and/or kV according to patient size and/or use of iterative reconstruction technique. COMPARISON:  Prior head CT 04/19/2023. FINDINGS: Brain: Cerebral volume remains normal for age. No midline shift,  ventriculomegaly, mass effect, evidence of mass lesion, intracranial hemorrhage or evidence of cortically based acute infarction. Stable gray-white matter differentiation throughout the brain. Patchy mild to moderate for age bilateral white matter hypodensity is stable. Vascular: Calcified atherosclerosis at the skull base. No suspicious intracranial vascular hyperdensity. Skull: Appears stable and intact. Sinuses/Orbits: Visualized paranasal sinuses and mastoids are stable and well aerated. Other: No acute orbit or scalp soft tissue injury identified. IMPRESSION: 1. No acute intracranial abnormality or acute traumatic injury identified. 2. Stable mild to moderate for age white matter changes most commonly due to chronic small vessel disease. Electronically Signed   By: VEAR Hurst M.D.   On: 07/01/2023 11:49    ____________________________________________   PROCEDURES  Procedure(s) performed:   Procedures  CRITICAL CARE Performed by: Fonda KANDICE Law Total critical care time: 35 minutes Critical care time was exclusive of separately billable procedures and treating other patients. Critical care was necessary to treat or prevent imminent or life-threatening deterioration. Critical care was time spent personally by me on the  following activities: development of treatment plan with patient and/or surrogate as well as nursing, discussions with consultants, evaluation of patient's response to treatment, examination of patient, obtaining history from patient or surrogate, ordering and performing treatments and interventions, ordering and review of laboratory studies, ordering and review of radiographic studies, pulse oximetry and re-evaluation of patient's condition.  Fonda Law, MD Emergency Medicine  ____________________________________________   INITIAL IMPRESSION / ASSESSMENT AND PLAN / ED COURSE  Pertinent labs & imaging results that were available during my care of the patient were reviewed by  me and considered in my medical decision making (see chart for details).   This patient is Presenting for Evaluation of fall/hypotension, which does require a range of treatment options, and is a complaint that involves a high risk of morbidity and mortality.  The Differential Diagnoses include hemorrhagic shock, sepsis, CAP, ICH, etc.  Critical Interventions-    Medications  0.9 %  sodium chloride  infusion ( Intravenous Infusion Verify 07/02/23 0700)  norepinephrine  (LEVOPHED ) 4mg  in (0.016 mg/mL) premix infusion (6 mcg/min Intravenous Infusion Verify 07/02/23 0700)  docusate sodium  (COLACE) capsule 100 mg (has no administration in time range)  polyethylene glycol (MIRALAX / GLYCOLAX) packet 17 g (has no administration in time range)  insulin  aspart (novoLOG ) injection 0-9 Units (1 Units Subcutaneous Given 07/02/23 0343)  ipratropium-albuterol (DUONEB) 0.5-2.5 (3) MG/3ML nebulizer solution 3 mL (has no administration in time range)  guaiFENesin  tablet 600 mg (600 mg Oral Given 07/01/23 2011)  piperacillin -tazobactam (ZOSYN ) IVPB 2.25 g (0 g Intravenous Stopped 07/02/23 0628)  vancomycin  variable dose per unstable renal function (pharmacist dosing) (has no administration in time range)  Chlorhexidine  Gluconate Cloth 2 % PADS 6 each (6 each Topical Given 07/01/23 1616)  Oral care mouth rinse (has no administration in time range)  acetaminophen  (TYLENOL ) tablet 650 mg (650 mg Oral Given 07/01/23 1631)  oxyCODONE  (Oxy IR/ROXICODONE ) immediate release tablet 5-10 mg (10 mg Oral Given 07/01/23 1822)  white petrolatum  (VASELINE) gel (has no administration in time range)  brimonidine  (ALPHAGAN ) 0.2 % ophthalmic solution 1 drop (1 drop Both Eyes Given 07/01/23 2254)  artificial tears ophthalmic solution 1 drop (has no administration in time range)  timolol  (TIMOPTIC ) 0.5 % ophthalmic solution 1 drop (1 drop Right Eye Given 07/01/23 2255)  mupirocin  ointment (BACTROBAN ) 2 % 1 Application (has no  administration in time range)  iohexol  (OMNIPAQUE ) 350 MG/ML injection 75 mL (75 mLs Intravenous Contrast Given 07/01/23 1138)  ceFEPIme  (MAXIPIME ) 2 g in sodium chloride  0.9 % 100 mL IVPB (0 g Intravenous Stopped 07/01/23 1305)  metroNIDAZOLE  (FLAGYL ) IVPB 500 mg (0 mg Intravenous Stopped 07/01/23 1335)  vancomycin  (VANCOREADY) IVPB 2000 mg/400 mL (0 mg Intravenous Stopped 07/01/23 1448)  sodium chloride  0.9 % bolus 1,000 mL (1,000 mLs Intravenous New Bag/Given 07/01/23 1404)  calcium  gluconate 1 g/ 50 mL sodium chloride  IVPB (0 mg Intravenous Stopped 07/01/23 1749)    Reassessment after intervention: BP improved.   Clinical Laboratory Tests Ordered, included Lactic acid 3.8. Creatinine 4.37. Mild anemia. UA pending.   Radiologic Tests Ordered, included CT head, c spine, CT ches abd/pelvis. I independently interpreted the images and agree with radiology interpretation.   Cardiac Monitor Tracing which shows NSR.    Social Determinants of Health Risk patient is a non-smoker.   Consult complete with Trauma Surgery Dr. Paola who lead the initial trauma/resuscitation.  ICU team. Plan for admit.   Medical Decision Making: Summary:  Patient arrives to the emergency department after ground-level fall with  hypotension.  Clinically seems most consistent with underlying medical etiology driving the hypotension but per protocol activated as a level 1 trauma.  Trauma surgery gave initial fluid bolus, 1 unit of O- blood, and started norepinephrine .  Patient going to CT.  Reevaluation with update and discussion with patient.  Workup is unremarkable from a trauma standpoint with exception of a small hematoma.  Patient does have what appears to be multifocal pneumonia and symptoms that correlate with this well.  Suspect sepsis from multifocal pneumonia.  Antibiotics and cultures added after trauma etiology was excluded.  Patient on peripheral vasopressors.  Plan for ICU admit   Patient's presentation is  most consistent with acute presentation with potential threat to life or bodily function.   Disposition: admit  ____________________________________________  FINAL CLINICAL IMPRESSION(S) / ED DIAGNOSES  Final diagnoses:  Multifocal pneumonia  Hypotension, unspecified hypotension type  Fall, initial encounter    Note:  This document was prepared using Dragon voice recognition software and may include unintentional dictation errors.  Fonda Law, MD, [facility] Emergency Medicine    Lavin Petteway, Fonda MATSU, MD 07/02/23 [phone]

## 2023-07-01 NOTE — Progress Notes (Signed)
 Pharmacy Antibiotic Note  Mary [name] is a 78 y.o. female admitted on 07/01/2023 with sepsis.  Pharmacy has been consulted for vancomycin  and Zosyn  dosing.  WBC 12.1, LA 3.8, afebrile. Unclear history, patient from assisted living with little documentation in EPIC. Does have a documented history of CKD but not on dialysis. Unknown baseline creatinine, currently 4.2. Patient received cefepime  2g, metronidazole  500 mg, and vancomycin  2000 mg x 1 in the ED.   Plan: Vancomycin  variable dosing in setting of elevated creatinine  Subsequent dosing as indicated per random vancomycin  level until renal function stable and/or improved, at which time scheduled dosing can be considered  Start Zosyn  2.25g IV every 8 hours  Monitor cultures, renal function, and overall clinical picture  Height: 5' 7 (170.2 cm) Weight: (!) 167.8 kg (370 lb) IBW/kg (Calculated) : 61.6  Temp (24hrs), Avg:97.2 F (36.2 C), Min:97.2 F (36.2 C), Max:97.2 F (36.2 C)  Recent Labs  Lab 07/01/23 1111 07/01/23 1121  WBC 12.1*  --   CREATININE 4.37* 4.20*  LATICACIDVEN  --  3.8*    Estimated Creatinine Clearance: 19 mL/min (A) (by C-G formula based on SCr of 4.2 mg/dL (H)).    No Known Allergies  Antimicrobials this admission: Cefepime  and metronidazole  x 1 6/17 Vancomycin  6/17 >>  Zosyn  6/18 >>    Microbiology results: Pending  Thank you for allowing pharmacy to be a part of this patient's care.  Mary [name] 07/01/2023 3:09 PM

## 2023-07-01 NOTE — H&P (Signed)
 NAME:  CAMDYNN MARANTO, MRN:  [id number], DOB:  [birth year], LOS: 0 ADMISSION DATE:  07/01/2023, CONSULTATION DATE:  07/01/2023 REFERRING MD:  Darra, EDP CHIEF COMPLAINT:  shock, pneumonia  History of Present Illness:   Pending chart merger> MRN [id number]  78 year old female with PMH of former smoker (quit 2016), HTN, HFpEF, DM, immobility, chronic lymphedema, hypertensive retinopathy, glaucoma, central retinal vein occlusion, hyperlipidemia, hypothyroidism, GERD, chronic pain, and obesity who presented to the emergency department today with fall from bed.  Presents from assisted living.  Has state DNR form.  Pt reports they were trying to get her out of bed and she fell vs slid to ground.  Pt denies hitting her head or getting dizziness but somehow ended up face down and could not breath.  Pt complains currently of right posterior hip soreness.  Also reports increasing congested productive cough over last several days after taking a cold shower but denies any SOB or fever.  Has been drinking but has no appetite.  Denies N/V/D, CP, UTI symptoms, headache, or current dizziness.  Has soreness in her abd since Saturday pt attributes to facility having a hard time moving her.    Witnesses to the event noted that while she was laying on the ground, she had a unresponsive episode versus syncope.  EMS was called and on their arrival systolics in the 60s.  She was activated as a level 1 trauma due to fall and hypotension.  Labs notable for Na 133, BUN 102, sCr 4.37, Alk phos 296, Tbili 1.8, WBC 12.1,  Hgb 11.4, I-stat lactic 3.8. CXR with cardiomegaly, and bilateral opacities. CT head and C-spine without acute abnormalities. CT CAP obtained showing multifocal pneumonia and mucous plugging of the lower lobes, possible focal hematoma along the right rectus abdominus. She was started on antibiotics with vancomycin /flagyl /cefepime  for sepsis. Given 1L IVF bolus and started on peripheral NE for ongoing hypotension and  presumed septic shock. CCM was consulted for admission.   Pertinent  Medical History  former smoker (quit 2016), HTN, HFpEF, DM, immobility, chronic lymphedema, hypertensive retinopathy, glaucoma, central retinal vein occlusion, hyperlipidemia, hypothyroidism, GERD, chronic pain, and obesity  Significant Hospital Events: Including procedures, antibiotic start and stop dates in addition to other pertinent events   6/17: admit to CCM for septic shock  Interim History / Subjective:  On time of exam, off NE but MAP < 65.  Pt asymptomatic   Objective   Blood pressure (!) 113/103, pulse 93, temperature (!) 97.2 F (36.2 C), temperature source Temporal, resp. rate 14, height 5' 7 (1.702 m), weight (!) 167.8 kg, SpO2 99%.       No intake or output data in the 24 hours ending 07/01/23 1420 Filed Weights   07/01/23 1106 07/01/23 1113  Weight: (!) 167 kg (!) 167.8 kg   Examination: General:  chronically ill appearing elderly obese female lying in bed in NAD HEENT: MM pink/moist, some clear drainage out of eyes, keeps shut- vision is poor, very small abrasion to left of eye, deformity of left clavicle (chronic) Neuro: talkative, oriented to person, place, year and few tries on month.  MAE at baseline CV: rr, NSR, no murmur, +1 palpable pulses PULM:  non labored, bilateral exp wheeze, few rales on left, 2L [place] at 96-100% GI: obese, palpable firmness in right upper abd area which is tender but otherwise soft, +bs, foley with purulent drainage Extremities: warm/dry, BLE lymphedema  Skin: no rashes   Resolved Hospital Problem list    Assessment &  Plan:  Septic shock 2/2 multifocal pneumonia and suspected UTI Lactic acidosis  CXR with multifocal opacities, seen again on CT chest with bilateral, multifocal pneumonia and mucous plugging of the lower lobes. Also, spiculated lesion seen in RUL.  Purulent drainage in foley P:  - admit to ICU for close monitoring - peripheral NE prn for MAP goal >  65 - completing 2L LR> hold MIVF for now - pending UA/ UC, follow BC - check RVP, MRSA PCR - send expectorated sputum if able  - continue with vancomycin /zosyn  - trend wbc, lactic, fever curve   Syncope/Unresponsiveness  Presume this is related to her hypotension from septic shock vs ?orthostatic given poor PO intake. However, given age, cardiomegaly will rule out other causes. CT head negative.  No known anticoagulates, normal INR. - obtain mag, TSH  - tele monitoring  - f/u echocardiogram, check troponin.  EKG SR without ST changes, noted PAC's, RAD, lateral qwaves, old anteroseptal infarct.  Prior TTE 03/2021 EF 50-55%, G2DD, mild MR, mod TR - currently neurologically at baseline, cont to trend neuro checks  Acute hypoxic respiratory failure 2/2 multifocal pneumonia  Multiple lung nodules, ?spiculated in RLL - requiring 2L [place] currently supplemental O2, not on HOT, goal > 92% - antibiotics as above  - check urine strep and legionella, RVP, MRSA PCR  - expectorated sputum if able - duonebs q4prn - pulm hygiene with IS, flutter, guaifenesin   - CXR prn - will need repeat CT in 3 months to ensure clearing of nodules, is higher risk given prior smoking hx  Acute/Chronic Renal failure - hx of decreased PO intake, no appetite, ?possible obstruction given purulence in foley Hypocalcemia  Presented with BUN 102, sCr 4.37.  Previous sCr 3.1, BUN 70, in April 2025 on ER visit - no obstruction on CT imaging - check urine studies, pending UA - iCa 0.99> 1gm calcium  gluconate - trend bmp, mag, phos - replete elytes - strict I&O - Avoid nephrotoxic agents, renally dose medications - ensure adequate renal perfusion   Rectus abdominus hematoma, POA  - watch clinically, reports pain since Saturday after she was moved.   If H/H remains stable, start VTE ppx in am  DMT2 - check A1c - CBG q4/ SSI prn   HTN HLD - due to hypotension hold all pta meds> unclear home meds, awaiting med  rec  Hypothyroidism - pending TSH - awaiting med rec review  Chronic debility Lymphedema  Chronic pain  - non wt bearing at baseline  - awaiting home med rec  Obesity- BMI 57.95  GOC - pt wishes to remains DNR/ DNI, ok with pressors for now.  Daughter and son at bedside during conversation   Best Practice (right click and Advertising copywriter Selections daily)   Diet/type: clear liquids DVT prophylaxis: SCD Pressure ulcer(s). Clinically undetermined if the pressure injury was present on admission GI prophylaxis: N/A Lines: N/A Foley:  Yes, and it is still needed Code Status:  DNR Last date of multidisciplinary goals of care discussion [07/01/2023]  Daughter and son at bedside, updated on plan of care  Labs   CBC: Recent Labs  Lab 07/01/23 1111 07/01/23 1121  WBC 12.1*  --   HGB 11.4* 12.9  HCT 36.0 38.0  MCV 92.8  --   PLT 380  --     Basic Metabolic Panel: Recent Labs  Lab 07/01/23 1111 07/01/23 1121  NA 133* 133*  K 4.9 4.8  CL 96* 97*  CO2 22  --  GLUCOSE 134* 127*  BUN 102* 98*  CREATININE 4.37* 4.20*  CALCIUM  8.6*  --    GFR: Estimated Creatinine Clearance: 19 mL/min (A) (by C-G formula based on SCr of 4.2 mg/dL (H)). Recent Labs  Lab 07/01/23 1111 07/01/23 1121  WBC 12.1*  --   LATICACIDVEN  --  3.8*    Liver Function Tests: Recent Labs  Lab 07/01/23 1111  AST 35  ALT 31  ALKPHOS 296*  BILITOT 1.8*  PROT 6.3*  ALBUMIN  2.5*   No results for input(s): LIPASE, AMYLASE in the last 168 hours. No results for input(s): AMMONIA in the last 168 hours.  ABG    Component Value Date/Time   TCO2 23 07/01/2023 1121     Coagulation Profile: Recent Labs  Lab 07/01/23 1111  INR 1.1    Cardiac Enzymes: No results for input(s): CKTOTAL, CKMB, CKMBINDEX, TROPONINI in the last 168 hours.  HbA1C: No results found for: HGBA1C  CBG: No results for input(s): GLUCAP in the last 168 hours.  Review of Systems:    As above  Past Medical History:  She,  has no past medical history on file.   Surgical History:  unknown  Social History:  Former smoker, quit 2016   Family History:  Her family history is not on file.   Allergies Not on File > no allergies on prior chart   Home Medications  Prior to Admission medications   Not on File   Pending > unknown   Critical care time: 65 mins       Lyle Pesa, MSN, AG-ACNP-BC [facility] 07/01/2023, 3:27 PM  See Amion for pager If no response to pager , please call [phone] until 7pm After 7:00 pm call Elink  [phone]

## 2023-07-01 NOTE — ED Notes (Signed)
 Water provided

## 2023-07-01 NOTE — ED Triage Notes (Signed)
 Pt had a witnessed fall, pt was pivoting to a chair and fell. Hx of bilateral leg lymphedema. Pt was laying on floor and had a syncopal event, BP was initially 60 systolic. Pt arrives to baseline.

## 2023-07-01 NOTE — ED Notes (Signed)
 Pt placed on 2L of O2 due to O2 being 93% room air

## 2023-07-01 NOTE — Progress Notes (Signed)
 Provided support to pt. That experienced a fall. Supported Haematologist. Chaplain available as needed.  Rayleen Dade, Chapl ain, [facility], Pager [phone]

## 2023-07-01 NOTE — Progress Notes (Addendum)
 eLink Physician-Brief Progress Note Patient Name: Mary [name] DOB: [birth year] MRN: [id number]   Date of Service  07/01/2023  HPI/Events of Note  78 year old with multiple medical issues including chronic lymphedema, hypertension, HFpEF, diabetes admitted from nursing home after she had a fall from the bed, noted to be in shock in the ED with elevated lactic acid, sepsis.   Notified about critical troponin   eICU Interventions  Continue monitoring for now   2023 - Eyes have a lot of drainage and crusty; Resume home eyedrops  Intervention Category Minor Interventions: Clinical assessment - ordering diagnostic tests  Carel Schnee 07/01/2023, 7:39 PM

## 2023-07-01 NOTE — Progress Notes (Addendum)
 Troponin 1002 called into Elink Md.

## 2023-07-01 NOTE — Progress Notes (Signed)
 Orthopedic Tech Progress Note Patient Details:  Mary [name] 1945/07/08 [id number]  Level 1 trauma  Patient ID: Mary [name], female   DOB: [birth year], 78 y.o.   MRN: [id number]  Mary [name] 07/01/2023, 11:28 AM

## 2023-07-02 ENCOUNTER — Inpatient Hospital Stay (HOSPITAL_COMMUNITY): Payer: Medicare (Managed Care)

## 2023-07-02 DIAGNOSIS — J9601 Acute respiratory failure with hypoxia: Secondary | ICD-10-CM | POA: Diagnosis not present

## 2023-07-02 DIAGNOSIS — R578 Other shock: Secondary | ICD-10-CM

## 2023-07-02 DIAGNOSIS — A419 Sepsis, unspecified organism: Secondary | ICD-10-CM | POA: Diagnosis not present

## 2023-07-02 DIAGNOSIS — E878 Other disorders of electrolyte and fluid balance, not elsewhere classified: Secondary | ICD-10-CM

## 2023-07-02 DIAGNOSIS — R6521 Severe sepsis with septic shock: Secondary | ICD-10-CM | POA: Diagnosis not present

## 2023-07-02 DIAGNOSIS — R55 Syncope and collapse: Secondary | ICD-10-CM | POA: Diagnosis not present

## 2023-07-02 LAB — BPAM RBC
Blood Product Expiration Date: 202507202359
ISSUE DATE / TIME: 202506171111
Unit Type and Rh: 5100

## 2023-07-02 LAB — HEPATIC FUNCTION PANEL
ALT: 29 U/L (ref 0–44)
AST: 26 U/L (ref 15–41)
Albumin: 2.4 g/dL — ABNORMAL LOW (ref 3.5–5.0)
Alkaline Phosphatase: 267 U/L — ABNORMAL HIGH (ref 38–126)
Bilirubin, Direct: 0.8 mg/dL — ABNORMAL HIGH (ref 0.0–0.2)
Indirect Bilirubin: 1.2 mg/dL — ABNORMAL HIGH (ref 0.3–0.9)
Total Bilirubin: 2 mg/dL — ABNORMAL HIGH (ref 0.0–1.2)
Total Protein: 6.2 g/dL — ABNORMAL LOW (ref 6.5–8.1)

## 2023-07-02 LAB — ECHOCARDIOGRAM COMPLETE
AR max vel: 1.7 cm2
AV Area VTI: 1.62 cm2
AV Area mean vel: 1.54 cm2
AV Mean grad: 8 mmHg
AV Peak grad: 14.7 mmHg
Ao pk vel: 1.92 m/s
Area-P 1/2: 4.68 cm2
Calc EF: 40.1 %
Height: 67 in
MV M vel: 4.55 m/s
MV Peak grad: 82.8 mmHg
P 1/2 time: 321 ms
S' Lateral: 3.1 cm
Single Plane A2C EF: 30.1 %
Single Plane A4C EF: 48.9 %
Weight: 4532.66 [oz_av]

## 2023-07-02 LAB — CBC
HCT: 35.9 % — ABNORMAL LOW (ref 36.0–46.0)
Hemoglobin: 12.1 g/dL (ref 12.0–15.0)
MCH: 30.4 pg (ref 26.0–34.0)
MCHC: 33.7 g/dL (ref 30.0–36.0)
MCV: 90.2 fL (ref 80.0–100.0)
Platelets: 373 10*3/uL (ref 150–400)
RBC: 3.98 MIL/uL (ref 3.87–5.11)
RDW: 15.9 % — ABNORMAL HIGH (ref 11.5–15.5)
WBC: 12.3 10*3/uL — ABNORMAL HIGH (ref 4.0–10.5)
nRBC: 0.7 % — ABNORMAL HIGH (ref 0.0–0.2)

## 2023-07-02 LAB — CORTISOL: Cortisol, Plasma: 24.3 ug/dL

## 2023-07-02 LAB — BASIC METABOLIC PANEL WITH GFR
Anion gap: 10 (ref 5–15)
BUN: 94 mg/dL — ABNORMAL HIGH (ref 8–23)
CO2: 22 mmol/L (ref 22–32)
Calcium: 8.2 mg/dL — ABNORMAL LOW (ref 8.9–10.3)
Chloride: 99 mmol/L (ref 98–111)
Creatinine, Ser: 3.82 mg/dL — ABNORMAL HIGH (ref 0.44–1.00)
GFR, Estimated: 12 mL/min — ABNORMAL LOW (ref >60)
Glucose, Bld: 132 mg/dL — ABNORMAL HIGH (ref 70–99)
Potassium: 4.4 mmol/L (ref 3.5–5.1)
Sodium: 131 mmol/L — ABNORMAL LOW (ref 135–145)

## 2023-07-02 LAB — GLUCOSE, CAPILLARY
Glucose-Capillary: 129 mg/dL — ABNORMAL HIGH (ref 70–99)
Glucose-Capillary: 110 mg/dL — ABNORMAL HIGH (ref 70–99)
Glucose-Capillary: 103 mg/dL — ABNORMAL HIGH (ref 70–99)
Glucose-Capillary: 127 mg/dL — ABNORMAL HIGH (ref 70–99)
Glucose-Capillary: 135 mg/dL — ABNORMAL HIGH (ref 70–99)

## 2023-07-02 LAB — TYPE AND SCREEN
ABO/RH(D): O POS
Antibody Screen: NEGATIVE
Unit division: 0

## 2023-07-02 LAB — CREATININE, URINE, RANDOM: Creatinine, Urine: 102 mg/dL

## 2023-07-02 LAB — LACTIC ACID, PLASMA
Lactic Acid, Venous: 2.1 mmol/L (ref 0.5–1.9)
Lactic Acid, Venous: 2.4 mmol/L (ref 0.5–1.9)

## 2023-07-02 LAB — PROCALCITONIN: Procalcitonin: 3.15 ng/mL

## 2023-07-02 LAB — MAGNESIUM: Magnesium: 2.5 mg/dL — ABNORMAL HIGH (ref 1.7–2.4)

## 2023-07-02 LAB — PHOSPHORUS: Phosphorus: 5.4 mg/dL — ABNORMAL HIGH (ref 2.5–4.6)

## 2023-07-02 LAB — SODIUM, URINE, RANDOM: Sodium, Ur: 29 mmol/L

## 2023-07-02 LAB — RESPIRATORY PANEL BY PCR

## 2023-07-02 LAB — STREP PNEUMONIAE URINARY ANTIGEN: Strep Pneumo Urinary Antigen: NEGATIVE

## 2023-07-02 MED ORDER — LEVOTHYROXINE SODIUM 25 MCG PO TABS
125.0000 ug | ORAL_TABLET | Freq: Every day | ORAL | Status: DC
  Administered 2023-07-03: 125 ug
  Filled 2023-07-02: qty 1

## 2023-07-02 MED ORDER — LACTATED RINGERS IV BOLUS
1000.0000 mL | Freq: Once | INTRAVENOUS | Status: AC
  Administered 2023-07-02: 1000 mL via INTRAVENOUS

## 2023-07-02 MED ORDER — HEPARIN SODIUM (PORCINE) 5000 UNIT/ML IJ SOLN
5000.0000 [IU] | Freq: Three times a day (TID) | INTRAMUSCULAR | Status: DC
  Administered 2023-07-02 – 2023-07-11 (×28): 5000 [IU] via SUBCUTANEOUS
  Filled 2023-07-02 (×28): qty 1

## 2023-07-02 MED ORDER — PRAVASTATIN SODIUM 40 MG PO TABS
40.0000 mg | ORAL_TABLET | Freq: Every day | ORAL | Status: DC
  Administered 2023-07-02: 40 mg
  Filled 2023-07-02: qty 1

## 2023-07-02 MED ORDER — PREDNISONE 20 MG PO TABS
40.0000 mg | ORAL_TABLET | Freq: Every day | ORAL | Status: AC
  Administered 2023-07-03 – 2023-07-09 (×7): 40 mg via ORAL
  Filled 2023-07-02 (×7): qty 2

## 2023-07-02 MED ORDER — MUPIROCIN 2 % EX OINT
1.0000 | TOPICAL_OINTMENT | Freq: Two times a day (BID) | CUTANEOUS | Status: AC
Start: 2023-07-02 — End: 2023-07-06
  Administered 2023-07-02 – 2023-07-06 (×10): 1 via NASAL
  Filled 2023-07-02: qty 22

## 2023-07-02 MED ORDER — LACTATED RINGERS IV SOLN: INTRAVENOUS | Status: DC

## 2023-07-02 MED ORDER — INSULIN ASPART 100 UNIT/ML IJ SOLN
0.0000 [IU] | Freq: Three times a day (TID) | INTRAMUSCULAR | Status: DC
  Administered 2023-07-02 – 2023-07-03 (×2): 1 [IU] via SUBCUTANEOUS
  Administered 2023-07-03 – 2023-07-04 (×5): 2 [IU] via SUBCUTANEOUS
  Administered 2023-07-05: 1 [IU] via SUBCUTANEOUS
  Administered 2023-07-05: 2 [IU] via SUBCUTANEOUS
  Administered 2023-07-05: 3 [IU] via SUBCUTANEOUS
  Administered 2023-07-06 – 2023-07-10 (×6): 1 [IU] via SUBCUTANEOUS

## 2023-07-02 NOTE — Consult Note (Signed)
 WOC Nurse Consult Note: Consult performed remotely utilizing chart review and images Reason for Consult:sacral decub ulcerations present upon admission  Wound type: Coccyx: DTPI with pink dry scar tissue Sacrum healing stage 3 pressure injury L buttocks stage 2 pressure injury R heel: DTPI L heel: DTPI Pressure Injury POA: Yes Measurement: see nursing flow sheet Wound bed: Coccyx DTPI with pink dry scar tissue Sacrum: 100% pink L buttocks: 100 % pink/red R heel: purple maroon area of discoloration, intact L heel : purple maroon area of discoloration with lifting of epidermal layer of skin Drainage (amount, consistency, odor) see nursing flow sheet Periwound:intact Dressing procedure/placement/frequency: Coccyx/Sacrum/ L buttocks:  Cleans wounds with Vashe ( lawson #[id number]) allow to air dry, place Xeroform on wound bed and cover with foam dressing, change daily and PRN soling.  R heel: place a foam dressing, change Q 3 days.  Lift dressing Q shift to observe site. L heel: Cleans wounds with Vashe ( lawson [phone]) allow to air dry, place Xeroform on wound bed and cover with foam dressing, change daily and PRN soling.   Utilize Prevalon boots to both feet to offload pressure.   Consider low air loss mattress to address pressure and moisture concerns.  WOC team will not follow patient at this time, please re-consult if new needs arise.    Doyal Polite, RN, MSN, [facility] WOC Team

## 2023-07-02 NOTE — Progress Notes (Signed)
*  PRELIMINARY RESULTS* Echocardiogram 2D Echocardiogram has been performed.  Mary [name] 07/02/2023, 8:46 AM

## 2023-07-02 NOTE — Plan of Care (Signed)

## 2023-07-02 NOTE — TOC Initial Note (Signed)
 Transition of Care [facility]) - Initial/Assessment Note    Patient Details  Name: Mary [name] MRN: [id number] Date of Birth: 1945-02-15  Transition of Care [facility]) CM/SW Contact:    Inocente GORMAN Kindle, LCSW Phone Number: 07/02/2023, 9:11 AM  Clinical Narrative:                 Patient admitted from [facility] with [facility] following. CSW will continue to monitor for needs.   Expected Discharge Plan: Skilled Nursing Facility Barriers to Discharge: Continued Medical Work up   Patient Goals and CMS Choice            Expected Discharge Plan and Services In-house Referral: Clinical Social Work   Post Acute Care Choice: Skilled Nursing Facility Living arrangements for the past 2 months: Skilled Nursing Facility                                      Prior Living Arrangements/Services Living arrangements for the past 2 months: Skilled Nursing Facility   Patient language and need for interpreter reviewed:: Yes Do you feel safe going back to the place where you live?: Yes      Need for Family Participation in Patient Care: Yes (Comment) Care giver support system in place?: Yes (comment)   Criminal Activity/Legal Involvement Pertinent to Current Situation/Hospitalization: No - Comment as needed  Activities of Daily Living      Permission Sought/Granted Permission sought to share information with : Facility Medical sales representative, Family Supports Permission granted to share information with : No  Share Information with NAME: [name]  Permission granted to share info w AGENCY: SNF/PACE  Permission granted to share info w Relationship: Daughter  Permission granted to share info w Contact Information: [phone]  Emotional Assessment Appearance:: Appears stated age Attitude/Demeanor/Rapport: Unable to Assess Affect (typically observed): Unable to Assess Orientation: : Oriented to Self, Oriented to Place Alcohol  / Substance Use: Not Applicable Psych Involvement: No  (comment)  Admission diagnosis:  Fall, initial encounter [W19.XXXA] Septic shock (HCC) [A41.9, R65.21] Hypotension, unspecified hypotension type [I95.9] Multifocal pneumonia [J18.9] Patient Active Problem List   Diagnosis Date Noted   Septic shock (HCC) 07/01/2023   PCP:  Patient, No Pcp Per Pharmacy:   [facility] [address] [place] [address] Phone: [phone] Fax: [phone]     Social Drivers of Health (SDOH) Social History: SDOH Screenings   Tobacco Use: Low Risk  (07/01/2023)   SDOH Interventions:     Readmission Risk Interventions     No data to display

## 2023-07-02 NOTE — Progress Notes (Signed)
 NAME:  Mary [name], MRN:  [id number], DOB:  February 05, 1945, LOS: 1 ADMISSION DATE:  07/01/2023, CONSULTATION DATE:  07/01/2023 REFERRING MD:  Darra, EDP CHIEF COMPLAINT:  shock, pneumonia  History of Present Illness:    78 year old female with PMH of former smoker (quit 2016), HTN, HFpEF, DM, immobility, chronic lymphedema, hypertensive retinopathy, glaucoma, central retinal vein occlusion, hyperlipidemia, hypothyroidism, GERD, chronic pain, and obesity who presented to the emergency department s/p fall from bed.  Presents from assisted living.  Has state DNR form.  Pt reports they were trying to get her out of bed and she fell vs slid to ground.  Pt denies hitting her head or getting dizziness but somehow ended up face down and could not breath.  Pt complains currently of right posterior hip soreness.  Also reports increasing congested productive cough over last several days after taking a cold shower but denies any SOB or fever.  Had been drinking but has no appetite.  Denies N/V/D, CP, UTI symptoms, headache, or current dizziness.  Has soreness in her abd since Saturday pt attributes to facility having a hard time moving her.    Witnesses to the event noted that while she was laying on the ground, she had a unresponsive episode versus syncope.  EMS was called and on their arrival systolics in the 60s.  She was activated as a level 1 trauma due to fall and hypotension.  Labs notable for Na 133, BUN 102, sCr 4.37, Alk phos 296, Tbili 1.8, WBC 12.1,  Hgb 11.4, I-stat lactic 3.8. CXR with cardiomegaly, and bilateral opacities. CT head and C-spine without acute abnormalities. CT CAP obtained showing multifocal pneumonia and mucous plugging of the lower lobes, possible focal hematoma along the right rectus abdominus. She was started on antibiotics with vancomycin /flagyl /cefepime  for sepsis. Given 1L IVF bolus and started on peripheral NE for ongoing hypotension and presumed septic shock. CCM was consulted  for admission.   Pertinent  Medical History  former smoker (quit 2016), HTN, HFpEF, DM, immobility, chronic lymphedema, hypertensive retinopathy, glaucoma, central retinal vein occlusion, hyperlipidemia, hypothyroidism, GERD, chronic pain, and obesity  Significant Hospital Events: Including procedures, antibiotic start and stop dates in addition to other pertinent events   6/17: admit to CCM for septic shock volume depletion, PNA and also found to have traumatic hematoma involving right rectus abdominus. Starting on broad spec abx vanc and zosyn  6/18 still on NE. POCUS showed collapsible IVC so added back IVFs. Formal ECHO pending. Getting LLE US  to r/o DVT. Working on med rec from [employer]. Started systemic steroids given elevated PSI score for CAP. Checking cortisol prior   Interim History / Subjective:   No distress. Reports chronic posterior left knee pain. Cough persists.   Objective   Blood pressure (!) 99/53, pulse 83, temperature 98.2 F (36.8 C), temperature source Axillary, resp. rate (!) 23, height 5' 7 (1.702 m), weight 128.5 kg, SpO2 97%.        Intake/Output Summary (Last 24 hours) at 07/02/2023 0818 Last data filed at 07/02/2023 0716 Gross per 24 hour  Intake 749.77 ml  Output 1050 ml  Net -300.23 ml   Filed Weights   07/01/23 1106 07/01/23 1113 07/02/23 0344  Weight: (!) 167 kg (!) 167.8 kg 128.5 kg   Examination:  General chronically ill appearing 78 year old female. Laying in bed. No distress HENT NCAT no JVD Pulm crackles left base. Rhonchus cough. Now on room air Card rrr Abd soft. POCUS eval of IVC shows respiratory  collapse  Ext chronic LE lymphedema  Neuro oriented. Limited mobility of lowers.   Resolved Hospital Problem list    Assessment & Plan:  Septic shock w/ lactic acidosis 2/2 multifocal pneumonia (parainfluenza virus) +/- CAP and suspected UTI (purulent urine in foley) Lactate clearing, still on low dose NE MRSA PCR + Cultures still pending  Got  2 liters crystalloid; I&O balance -225 IVC collapsible  Plan Day 2 vanc and zosyn  Repeat LR bolus and add IVFs F/u pending cultures  Cont to titrate peripheral NE  for SBP goal > 65 F/u pending cultures Repeat lactate for clearance   Syncope/Unresponsiveness w/ elevated trop I  Presume this is related to her hypotension from septic shock vs ?orthostatic given poor PO intake. However, given age, cardiomegaly will rule out other causes. CT head negative.  No known anticoagulates, normal INR. - currently neurologically at baseline, cont to trend neuro checks plan Cont tele  F/u formal echo  Holding asa for now given possible abd hematoma. If hgb stable can resume   Acute hypoxic respiratory failure 2/2 multifocal pneumonia  Multiple lung nodules, ?spiculated in RLL O2 needs: now on room air PSI/PORT score 155. 27-29% mortality  Plan Cont pulse ox Wean o2 for sats >92% Abx as above Adding 7d course of pred for CAP given PSI > 130  F/u urine legionella duonebs q4prn Cont pulm hygiene with IS, flutter, guaifenesin   will need repeat CT in 3 months to ensure clearing of nodules, is higher risk given prior smoking hx Am cxr  Acute/Chronic Renal failure - hx of decreased PO intake, no appetite, ?possible obstruction given purulence in foley BUN/cr improved w/ resuscitation effort, UNa 29 w/ FENA 0.9 still suggesting pre-renal status. The renal US  showed several small bilateral cysts but no hydro  Plan IVF bolus, and add IVMF SBP goal > 100 Cont strict I&O Renal dose meds Am chem   Fluid and electrolyte imbalance: hyponatremia, hypocalcemia  Plan Cont IVFs  Abnormal alk PO4.  T-bili marginally elevated. Ast/alt nml. CT although poor contrast did not GB distention. She is bedbound essentially suspect this is related Plan Am LFTs  Rectus abdominus hematoma, POA  - watch clinically, reports pain since Saturday after she was moved.  Hgb stable Plan Will start Fisher heparin   today If am cbc ok can resume her asa  DMT2 Plan Ssi  Goal 240-180   HTN HLD Plan Resume home meds when approp   Hypothyroidism Plan Resume home synthroid    Chronic debility, Obesity- BMI 57.95, Lymphedema  and Chronic pain  - non wt bearing at baseline  Plan Holding her home diuretics Left leg US  r/o DVT given pain   GOC - pt wishes to remains DNR/ DNI, ok with pressors for now.  Daughter and son at bedside during conversation   Best Practice (right click and Advertising copywriter Selections daily)   Diet/type: Regular consistency (see orders) DVT prophylaxis: prophylactic heparin   Pressure ulcer(s). Clinically undetermined if the pressure injury was present on admission GI prophylaxis: N/A Lines: N/A Foley:  Yes, and it is still needed Code Status:  DNR Last date of multidisciplinary goals of care discussion [07/01/2023]  Daughter and son at bedside, updated on plan of care  Critical care time: 45 minutes

## 2023-07-03 ENCOUNTER — Inpatient Hospital Stay (HOSPITAL_COMMUNITY): Payer: Medicare (Managed Care)

## 2023-07-03 DIAGNOSIS — R55 Syncope and collapse: Secondary | ICD-10-CM | POA: Diagnosis not present

## 2023-07-03 DIAGNOSIS — A419 Sepsis, unspecified organism: Secondary | ICD-10-CM | POA: Diagnosis not present

## 2023-07-03 DIAGNOSIS — M79605 Pain in left leg: Secondary | ICD-10-CM | POA: Diagnosis not present

## 2023-07-03 DIAGNOSIS — J9601 Acute respiratory failure with hypoxia: Secondary | ICD-10-CM | POA: Diagnosis not present

## 2023-07-03 DIAGNOSIS — R6521 Severe sepsis with septic shock: Secondary | ICD-10-CM | POA: Diagnosis not present

## 2023-07-03 LAB — CBC
HCT: 36.3 % (ref 36.0–46.0)
Hemoglobin: 12.1 g/dL (ref 12.0–15.0)
MCH: 29.8 pg (ref 26.0–34.0)
MCHC: 33.3 g/dL (ref 30.0–36.0)
MCV: 89.4 fL (ref 80.0–100.0)
Platelets: 370 10*3/uL (ref 150–400)
RBC: 4.06 MIL/uL (ref 3.87–5.11)
RDW: 16.5 % — ABNORMAL HIGH (ref 11.5–15.5)
WBC: 12.1 10*3/uL — ABNORMAL HIGH (ref 4.0–10.5)
nRBC: 0.9 % — ABNORMAL HIGH (ref 0.0–0.2)

## 2023-07-03 LAB — URINE CULTURE: Culture: >=100000 — AB

## 2023-07-03 LAB — COMPREHENSIVE METABOLIC PANEL WITH GFR
ALT: 22 U/L (ref 0–44)
AST: 16 U/L (ref 15–41)
Albumin: 2.1 g/dL — ABNORMAL LOW (ref 3.5–5.0)
Alkaline Phosphatase: 203 U/L — ABNORMAL HIGH (ref 38–126)
Anion gap: 12 (ref 5–15)
BUN: 91 mg/dL — ABNORMAL HIGH (ref 8–23)
CO2: 22 mmol/L (ref 22–32)
Calcium: 8.2 mg/dL — ABNORMAL LOW (ref 8.9–10.3)
Chloride: 97 mmol/L — ABNORMAL LOW (ref 98–111)
Creatinine, Ser: 3.4 mg/dL — ABNORMAL HIGH (ref 0.44–1.00)
GFR, Estimated: 14 mL/min — ABNORMAL LOW (ref >60)
Glucose, Bld: 177 mg/dL — ABNORMAL HIGH (ref 70–99)
Potassium: 4.5 mmol/L (ref 3.5–5.1)
Sodium: 131 mmol/L — ABNORMAL LOW (ref 135–145)
Total Bilirubin: 1.9 mg/dL — ABNORMAL HIGH (ref 0.0–1.2)
Total Protein: 6 g/dL — ABNORMAL LOW (ref 6.5–8.1)

## 2023-07-03 LAB — BRAIN NATRIURETIC PEPTIDE
B Natriuretic Peptide: 1627.9 pg/mL — ABNORMAL HIGH (ref 0.0–100.0)
B Natriuretic Peptide: 2012.7 pg/mL — ABNORMAL HIGH (ref 0.0–100.0)

## 2023-07-03 LAB — GLUCOSE, CAPILLARY
Glucose-Capillary: 123 mg/dL — ABNORMAL HIGH (ref 70–99)
Glucose-Capillary: 172 mg/dL — ABNORMAL HIGH (ref 70–99)
Glucose-Capillary: 171 mg/dL — ABNORMAL HIGH (ref 70–99)
Glucose-Capillary: 191 mg/dL — ABNORMAL HIGH (ref 70–99)

## 2023-07-03 LAB — HEPATIC FUNCTION PANEL
ALT: 20 U/L (ref 0–44)
AST: 16 U/L (ref 15–41)
Albumin: 2.1 g/dL — ABNORMAL LOW (ref 3.5–5.0)
Alkaline Phosphatase: 210 U/L — ABNORMAL HIGH (ref 38–126)
Bilirubin, Direct: 0.8 mg/dL — ABNORMAL HIGH (ref 0.0–0.2)
Indirect Bilirubin: 1.3 mg/dL — ABNORMAL HIGH (ref 0.3–0.9)
Total Bilirubin: 2.1 mg/dL — ABNORMAL HIGH (ref 0.0–1.2)
Total Protein: 6 g/dL — ABNORMAL LOW (ref 6.5–8.1)

## 2023-07-03 LAB — COOXEMETRY PANEL
Carboxyhemoglobin: 2.4 % — ABNORMAL HIGH (ref 0.5–1.5)
Methemoglobin: 1.1 % (ref 0.0–1.5)
O2 Saturation: 66.1 %
Total hemoglobin: 12.3 g/dL (ref 12.0–16.0)

## 2023-07-03 LAB — VANCOMYCIN, RANDOM: Vancomycin Rm: 13 ug/mL

## 2023-07-03 LAB — PROCALCITONIN: Procalcitonin: 3.24 ng/mL

## 2023-07-03 LAB — CALCIUM, IONIZED: Calcium, Ionized, Serum: 4.7 mg/dL (ref 4.5–5.6)

## 2023-07-03 MED ORDER — ZINC SULFATE 220 (50 ZN) MG PO CAPS
220.0000 mg | ORAL_CAPSULE | Freq: Every day | ORAL | Status: DC
  Administered 2023-07-03 – 2023-07-11 (×8): 220 mg via ORAL
  Filled 2023-07-03 (×9): qty 1

## 2023-07-03 MED ORDER — PRAVASTATIN SODIUM 10 MG PO TABS
40.0000 mg | ORAL_TABLET | Freq: Every day | ORAL | Status: DC
  Administered 2023-07-03 – 2023-07-11 (×7): 40 mg via ORAL
  Filled 2023-07-03 (×2): qty 4
  Filled 2023-07-03: qty 1
  Filled 2023-07-03: qty 4
  Filled 2023-07-03: qty 1
  Filled 2023-07-03 (×2): qty 4
  Filled 2023-07-03: qty 1
  Filled 2023-07-03 (×2): qty 4

## 2023-07-03 MED ORDER — VANCOMYCIN HCL 1500 MG/300ML IV SOLN
1500.0000 mg | Freq: Once | INTRAVENOUS | Status: AC
  Administered 2023-07-03: 1500 mg via INTRAVENOUS
  Filled 2023-07-03: qty 300

## 2023-07-03 MED ORDER — ADULT MULTIVITAMIN W/MINERALS CH
1.0000 | ORAL_TABLET | Freq: Every day | ORAL | Status: DC
  Administered 2023-07-03 – 2023-07-08 (×6): 1 via ORAL
  Filled 2023-07-03 (×7): qty 1

## 2023-07-03 MED ORDER — ENSURE PLUS HIGH PROTEIN PO LIQD
237.0000 mL | Freq: Two times a day (BID) | ORAL | Status: DC
  Administered 2023-07-03 – 2023-07-06 (×6): 237 mL via ORAL

## 2023-07-03 MED ORDER — NOREPINEPHRINE 4 MG/250ML-% IV SOLN
INTRAVENOUS | Status: AC
  Filled 2023-07-03: qty 250

## 2023-07-03 MED ORDER — NOREPINEPHRINE 4 MG/250ML-% IV SOLN
0.0000 ug/min | INTRAVENOUS | Status: DC
  Administered 2023-07-03: 5 ug/min via INTRAVENOUS
  Administered 2023-07-04: 1 ug/min via INTRAVENOUS
  Filled 2023-07-03: qty 250

## 2023-07-03 MED ORDER — VITAMIN C 500 MG PO TABS
500.0000 mg | ORAL_TABLET | Freq: Every day | ORAL | Status: DC
  Administered 2023-07-03 – 2023-07-11 (×8): 500 mg via ORAL
  Filled 2023-07-03 (×9): qty 1

## 2023-07-03 MED ORDER — LEVOTHYROXINE SODIUM 25 MCG PO TABS
125.0000 ug | ORAL_TABLET | Freq: Every day | ORAL | Status: DC
  Administered 2023-07-04 – 2023-07-10 (×7): 125 ug via ORAL
  Filled 2023-07-03 (×7): qty 1

## 2023-07-03 NOTE — Progress Notes (Signed)
 Left lower extremity venous duplex has been completed. Preliminary results can be found in CV Proc through chart review.   07/03/23 1:17 PM Cathlyn Collet RVT

## 2023-07-03 NOTE — Progress Notes (Signed)
 Pharmacy Antibiotic Note  Mary [name] is a 78 y.o. female admitted on 07/01/2023 with sepsis.  Pharmacy has been consulted for vancomycin  and Zosyn  dosing.  Parainfluenza positive. Plan per CCM to continue vancomycin  and Zosyn  until infectious bacterial etiology has been ruled out. VR this morning of 13, which is below therapeutic goal. Redosed to continue therapeutic levels. Will plan to check level again tomorrow to reassess. Scr improved from 3.82 to 3.40.  Plan: Give vancomycin  1500 mg IV x1 today Vancomycin  variable dosing in setting of elevated creatinine  Subsequent dosing as indicated per random vancomycin  level until renal function stable and/or improved, at which time scheduled dosing can be considered  Continue Zosyn  2.25g IV every 8 hours  Monitor cultures, renal function, and overall clinical picture  Height: 5' 7 (170.2 cm) Weight: 130.2 kg (287 lb 0.6 oz) IBW/kg (Calculated) : 61.6  Temp (24hrs), Avg:98.3 F (36.8 C), Min:97.6 F (36.4 C), Max:99.2 F (37.3 C)  Recent Labs  Lab 07/01/23 1111 07/01/23 1121 07/01/23 1750 07/02/23 0518 07/02/23 1026 07/02/23 1220 07/03/23 0509 07/03/23 1243  WBC 12.1*  --   --  12.3*  --   --  12.1*  --   CREATININE 4.37* 4.20*  --  3.82*  --   --   --  3.40*  LATICACIDVEN  --  3.8* 2.7*  --  2.1* 2.4*  --   --   VANCORANDOM  --   --   --   --   --   --  13  --     Estimated Creatinine Clearance: 20.1 mL/min (A) (by C-G formula based on SCr of 3.4 mg/dL (H)).    No Known Allergies  Antimicrobials this admission: Cefepime  and metronidazole  x 1 6/17 Vancomycin  6/17 >>  Zosyn  6/18 >>    Microbiology results: 6/18 Ucx: > 100K lactobacillus 6/17 Bcx: ngtd x2 days   Thank you for allowing pharmacy to be a part of this patient's care.  Nidia Schaffer, PharmD PGY1 Pharmacy Resident  Please check AMION for all [facility] Pharmacy phone numbers After 10:00 PM, call Main Pharmacy [phone] 07/03/2023 3:11 PM

## 2023-07-03 NOTE — Progress Notes (Addendum)
 NAME:  Mary [name], MRN:  [id number], DOB:  1945-10-08, LOS: 2 ADMISSION DATE:  07/01/2023, CONSULTATION DATE:  07/01/2023 REFERRING MD:  Darra, EDP CHIEF COMPLAINT:  shock, pneumonia  History of Present Illness:    78 year old female with PMH of former smoker (quit 2016), HTN, HFpEF, DM, immobility, chronic lymphedema, hypertensive retinopathy, glaucoma, central retinal vein occlusion, hyperlipidemia, hypothyroidism, GERD, chronic pain, and obesity who presented to the emergency department s/p fall from bed.  Presents from assisted living.  Has state DNR form.  Pt reports they were trying to get her out of bed and she fell vs slid to ground.  Pt denies hitting her head or getting dizziness but somehow ended up face down and could not breath.  Pt complains currently of right posterior hip soreness.  Also reports increasing congested productive cough over last several days after taking a cold shower but denies any SOB or fever.  Had been drinking but has no appetite.  Denies N/V/D, CP, UTI symptoms, headache, or current dizziness.  Has soreness in her abd since Saturday pt attributes to facility having a hard time moving her.    Witnesses to the event noted that while she was laying on the ground, she had a unresponsive episode versus syncope.  EMS was called and on their arrival systolics in the 60s.  She was activated as a level 1 trauma due to fall and hypotension.  Labs notable for Na 133, BUN 102, sCr 4.37, Alk phos 296, Tbili 1.8, WBC 12.1,  Hgb 11.4, I-stat lactic 3.8. CXR with cardiomegaly, and bilateral opacities. CT head and C-spine without acute abnormalities. CT CAP obtained showing multifocal pneumonia and mucous plugging of the lower lobes, possible focal hematoma along the right rectus abdominus. She was started on antibiotics with vancomycin /flagyl /cefepime  for sepsis. Given 1L IVF bolus and started on peripheral NE for ongoing hypotension and presumed septic shock. CCM was consulted  for admission.   Pertinent  Medical History  former smoker (quit 2016), HTN, HFpEF, DM, immobility, chronic lymphedema, hypertensive retinopathy, glaucoma, central retinal vein occlusion, hyperlipidemia, hypothyroidism, GERD, chronic pain, and obesity  Significant Hospital Events: Including procedures, antibiotic start and stop dates in addition to other pertinent events   6/17: admit to CCM for septic shock volume depletion, PNA and also found to have traumatic hematoma involving right rectus abdominus. Starting on broad spec abx vanc and zosyn  6/18 still on NE. POCUS showed collapsible IVC so added back IVFs. Formal ECHO pending. Getting LLE US  to r/o DVT. Working on med rec from [employer]. Started systemic steroids given elevated PSI score for CAP. Checking cortisol prior. Pressor needs remained elevated. ECHO later that afternoon. EF 35-40%) global hypokinesis, gd II diastolic dysfxn, mild red'd RV fxn w/ severely dilated RA and LA, at time of formal echo IVC w/ less than 50% resp variability  6/19 still on pressors   Interim History / Subjective:   No distress. Feels ok. Still on pressors   Objective   Blood pressure 110/62, pulse 84, temperature 97.6 F (36.4 C), temperature source Oral, resp. rate 12, height 5' 7 (1.702 m), weight 128.5 kg, SpO2 93%.        Intake/Output Summary (Last 24 hours) at 07/03/2023 0734 Last data filed at 07/03/2023 0700 Gross per 24 hour  Intake 2427.66 ml  Output 990 ml  Net 1437.66 ml   Filed Weights   07/01/23 1106 07/01/23 1113 07/02/23 0344  Weight: (!) 167 kg (!) 167.8 kg 128.5 kg   Examination:  General this is a 78 year old female. She is resting in bed and in no distress this am.  HENT NCAT no JVD. MMM now.  Pulm some scattered rhonchi. Still no accessory use. Still on room air PCXR eval today showed increased L>R airspace disease  Card + systolic HM, EF now reduced w/ global hypokinesis, gd II diastolic HF and dilated LA as well as RA Abd  soft Ext warm, chronic Lymphedema pulses are palp chronic post knee left discomfort the LEUS pending still Neuro awake oriented appropriate. Moves uppers at baseline. Unable to move lowers (chronically)  GU cl yellow but UOP dropping   Resolved Hospital Problem list    Assessment & Plan:  Septic shock w/ lactic acidosis 2/2 multifocal pneumonia (parainfluenza virus) +/- CAP and suspected UTI (purulent urine in foley); further complicated by newly identified systolic and diastolic CM ? Viral CM?  (EF 35-40%) global hypokinesis, gd II diastolic dysfxn, mild red'd RV fxn w/ severely dilated RA and LA, at time of formal echo IVC w/ less than 50% resp variability  Still on norepi MRSA PCR + Cultures still pending  Plan Day 3 vanc and zosyn  F/u pending cultures  Cont to titrate peripheral NE  for SBP goal > 65 Repeat lactate again today; would like to see this trend down  KVO IVFs Given on-going pressor needs and newly identified CM will need central access to determine CVP/co-ox; may need to add inotropic support.   Syncope/Unresponsiveness w/ elevated trop I  Presume this is related to her hypotension from septic shock vs ?orthostatic given poor PO intake. However, given age, cardiomegaly will rule out other causes. CT head negative.  No known anticoagulates, normal INR. - currently neurologically at baseline, cont to trend neuro checks plan Cont tele   Acute hypoxic respiratory failure 2/2 multifocal pneumonia  Multiple lung nodules, ?spiculated in RLL O2 needs:**on room air PSI/PORT score 155. 27-29% mortality CXR a little worse today  Plan Cont pulse ox Wean o2 for sats >92% Abx as above Day 2 of 7 pred for CAP given PSI > 130  F/u urine legionella duonebs q4prn Cont pulm hygiene with IS, flutter, guaifenesin   will need repeat CT in 3 months to ensure clearing of nodules, is higher risk given prior smoking hx Am cxr  Acute/Chronic Renal failure - BUN/cr improved w/  resuscitation effort, UNa 29 w/ FENA 0.9 still suggesting pre-renal status. The renal US  showed several small bilateral cysts but no hydro. Was still volume depleted as of 6/18 am, but responded to IVFs. UOP decreasing Plan F/u chem Strict I&O Renal adjust med KVO IVFs  Fluid and electrolyte imbalance: hyponatremia, hypocalcemia  Plan Awaiting am labs. Replace as indicated  Abnormal alk PO4.  T-bili marginally elevated. Ast/alt nml. CT although poor contrast did not GB distention. She is bedbound essentially suspect this is related Plan Am LFTs pending  Rectus abdominus hematoma, POA  - watch clinically, reports pain since Saturday after she was moved.  Hgb stable, started [place] heparin  6/18 Plan Resume asa today  Trend CBC  DMT2 Awaiting am check thus far at goal  Plan Ssi  Goal 140-180   HTN HLD Plan Resume home meds when approp   Hypothyroidism Plan synthroid    Chronic debility, Obesity- BMI 57.95, Lymphedema  and Chronic pain  - non wt bearing at baseline  Plan Holding her home diuretics Left leg US  r/o DVT given pain   GOC - pt wishes to remains DNR/ DNI, ok with pressors  for now.  Daughter and son at bedside during conversation   Best Practice (right click and Advertising copywriter Selections daily)   Diet/type: Regular consistency (see orders) DVT prophylaxis: prophylactic heparin   Pressure ulcer(s). Clinically undetermined if the pressure injury was present on admission GI prophylaxis: N/A Lines: N/A Foley:  Yes, and it is still needed Code Status:  DNR Last date of multidisciplinary goals of care discussion [07/01/2023]  Daughter and son at bedside, updated on plan of care  Critical care time: 46 minutes

## 2023-07-03 NOTE — Plan of Care (Signed)
 Attempts to wean patient off of levophed  were made and unsuccessful overnight. Patient comfortable and pain managed with prns ordered

## 2023-07-03 NOTE — TOC CAGE-AID Note (Signed)
 Transition of Care [facility]) - CAGE-AID Screening   Patient Details  Name: Mary [name] MRN: [id number] Date of Birth: 29-Sep-1945  Transition of Care [facility]) CM/SW Contact:    [name] E Jaevian Shean, LCSW Phone Number: 07/03/2023, 9:40 AM   Clinical Narrative:    CAGE-AID Screening:    Have You Ever Felt You Ought to Cut Down on Your Drinking or Drug Use?: No Have People Annoyed You By [employer] Your Drinking Or Drug Use?: No Have You Felt Bad Or Guilty About Your Drinking Or Drug Use?: No Have You Ever Had a Drink or Used Drugs First Thing In The Morning to Steady Your Nerves or to Get Rid of a Hangover?: No CAGE-AID Score: 0  Substance Abuse Education Offered: No

## 2023-07-03 NOTE — Procedures (Addendum)
 Central Venous Catheter Insertion Procedure Note  Mary [name]  [id number]  06-27-1945  Date:07/03/23  Time:5:18 PM   Provider Performing:Ashleigh E Drena RAINIER with direct guidance from Maude Banner ACNP  Procedure: Insertion of Non-tunneled Central Venous [phone]) with US  guidance (23062)   Indication(s) Medication administration  Consent Risks of the procedure as well as the alternatives and risks of each were explained to the patient and/or caregiver.  Consent for the procedure was obtained and is signed in the bedside chart  Anesthesia Topical only with 1% lidocaine   Timeout Verified patient identification, verified procedure, site/side was marked, verified correct patient position, special equipment/implants available, medications/allergies/relevant history reviewed, required imaging and test results available.  Sterile Technique Maximal sterile technique including full sterile barrier drape, hand hygiene, sterile gown, sterile gloves, mask, hair covering, sterile ultrasound probe cover (if used).  Procedure Description Area of catheter insertion was cleaned with chlorhexidine  and draped in sterile fashion.  With real-time ultrasound guidance a central venous catheter was placed into the left internal jugular vein. Nonpulsatile blood flow and easy flushing noted in all ports.  The catheter was sutured in place and sterile dressing applied.  Complications/Tolerance None; patient tolerated the procedure well. Chest X-ray is ordered to verify placement for internal jugular or subclavian cannulation.   Chest x-ray is not ordered for femoral cannulation.  EBL Minimal  Specimen(s) None  I was present and scrubbed in for this procedure to both assist and instruct   Procedure done by A Pate SNP

## 2023-07-03 NOTE — Progress Notes (Signed)
 Initial Nutrition Assessment  DOCUMENTATION CODES:   Morbid obesity  INTERVENTION:   Discussed with pt finger foods options on the menu  Discussed importance of adequate protein intake for wound healing  Ensure Plus High Protein po BID, each supplement provides 350 kcal and 20 grams of protein.  MVI with minerals daily  Vitamin C  500 mg daily x 30 days  220 mg Zinc  daily x 14 days   Continued nursing assistance with feeding   NUTRITION DIAGNOSIS:   Increased nutrient needs related to wound healing as evidenced by estimated needs.  GOAL:   Patient will meet greater than or equal to 90% of their needs  MONITOR:   PO intake, Supplement acceptance  REASON FOR ASSESSMENT:   Consult Poor PO  ASSESSMENT:   Pt with PMH of former smoker, DM, HTN, CHF, immobility, chronic lymphedema, hypertensive retinopathy, glaucoma, central retinal vein occlusion, HLD, GERD, chronic pain, obesity admitted from her ALF after fall from bed with septic shock, volume depletion, PNA, and traumatic hematoma involving R rectus abdominus.   Pt discussed during ICU rounds and with RN and MD.  Mary [name] with pt and RN. 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 She states she used to be able to wear glasses but this no longer helps her. She enjoys listening to [employer]. She was previously living at home alone and used her wheelchair to get around but is now in a facility. She is unable to roll around in her wheelchair now due to being blind. She complains that she is not always fed at facility and tries to eat food with her hand. Pt reports a lot of weight loss and used to be >300 lb.  Pt does not have her dentures in the hospital, they were left at the facility. She states she can eat softer foods with her gums and will chose foods she would like to eat.   Medications reviewed and include: SSI TID with meals, synthroid , prednisone   Labs reviewed:  Na 131 Phos 5.4 Mag 2.5 BNP 1627 A1C  4.8 CBG's: 123-135    NUTRITION - FOCUSED PHYSICAL EXAM:  Flowsheet Row Most Recent Value  Orbital Region Moderate depletion  Upper Arm Region No depletion  Thoracic and Lumbar Region No depletion  Buccal Region Moderate depletion  Temple Region Severe depletion  Clavicle Bone Region Severe depletion  Clavicle and Acromion Bone Region Moderate depletion  Scapular Bone Region Unable to assess  Dorsal Hand Moderate depletion  Patellar Region Unable to assess  Anterior Thigh Region Unable to assess  Posterior Calf Region Unable to assess  Edema (RD Assessment) Severe  [BLE]  Hair Reviewed  Eyes Unable to assess  Mouth Reviewed  [edentulous]  Skin Reviewed  Nails Reviewed    Diet Order:   Diet Order             Diet regular Room service appropriate? Yes with Assist; Fluid consistency: Thin  Diet effective now                   EDUCATION NEEDS:   Education needs have been addressed  Skin:  Skin Assessment: Skin Integrity Issues: Skin Integrity Issues:: Stage III, Stage II, DTI DTI: L heel, coccyx Stage II: L thigh Stage III: sacrum  Last BM:  6/17  Height:   Ht Readings from Last 1 Encounters:  07/01/23 5' 7 (1.702 m)    Weight:   Wt Readings from Last 1 Encounters:  07/03/23 130.2 kg  BMI:  Body mass index is 44.96 kg/m.  Estimated Nutritional Needs:   Kcal:  1800-2000  Protein:  100-110 grams  Fluid:  >1.9 L/day  Powell SQUIBB., RD, LDN, CNSC See AMiON for contact information

## 2023-07-04 ENCOUNTER — Inpatient Hospital Stay (HOSPITAL_COMMUNITY): Payer: Medicare (Managed Care)

## 2023-07-04 DIAGNOSIS — I5040 Unspecified combined systolic (congestive) and diastolic (congestive) heart failure: Secondary | ICD-10-CM | POA: Diagnosis not present

## 2023-07-04 DIAGNOSIS — R55 Syncope and collapse: Secondary | ICD-10-CM | POA: Diagnosis not present

## 2023-07-04 DIAGNOSIS — A419 Sepsis, unspecified organism: Secondary | ICD-10-CM | POA: Diagnosis not present

## 2023-07-04 DIAGNOSIS — R6521 Severe sepsis with septic shock: Secondary | ICD-10-CM | POA: Diagnosis not present

## 2023-07-04 LAB — COMPREHENSIVE METABOLIC PANEL WITH GFR
ALT: 19 U/L (ref 0–44)
AST: 14 U/L — ABNORMAL LOW (ref 15–41)
Albumin: 2 g/dL — ABNORMAL LOW (ref 3.5–5.0)
Alkaline Phosphatase: 168 U/L — ABNORMAL HIGH (ref 38–126)
Anion gap: 14 (ref 5–15)
BUN: 93 mg/dL — ABNORMAL HIGH (ref 8–23)
CO2: 20 mmol/L — ABNORMAL LOW (ref 22–32)
Calcium: 8.5 mg/dL — ABNORMAL LOW (ref 8.9–10.3)
Chloride: 98 mmol/L (ref 98–111)
Creatinine, Ser: 3.65 mg/dL — ABNORMAL HIGH (ref 0.44–1.00)
GFR, Estimated: 12 mL/min — ABNORMAL LOW (ref >60)
Glucose, Bld: 184 mg/dL — ABNORMAL HIGH (ref 70–99)
Potassium: 4.5 mmol/L (ref 3.5–5.1)
Sodium: 132 mmol/L — ABNORMAL LOW (ref 135–145)
Total Bilirubin: 1.3 mg/dL — ABNORMAL HIGH (ref 0.0–1.2)
Total Protein: 5.7 g/dL — ABNORMAL LOW (ref 6.5–8.1)

## 2023-07-04 LAB — CBC
HCT: 34.2 % — ABNORMAL LOW (ref 36.0–46.0)
Hemoglobin: 11.2 g/dL — ABNORMAL LOW (ref 12.0–15.0)
MCH: 29.5 pg (ref 26.0–34.0)
MCHC: 32.7 g/dL (ref 30.0–36.0)
MCV: 90 fL (ref 80.0–100.0)
Platelets: 441 10*3/uL — ABNORMAL HIGH (ref 150–400)
RBC: 3.8 MIL/uL — ABNORMAL LOW (ref 3.87–5.11)
RDW: 16.3 % — ABNORMAL HIGH (ref 11.5–15.5)
WBC: 10.4 10*3/uL (ref 4.0–10.5)
nRBC: 0.7 % — ABNORMAL HIGH (ref 0.0–0.2)

## 2023-07-04 LAB — COOXEMETRY PANEL
Carboxyhemoglobin: 2.9 % — ABNORMAL HIGH (ref 0.5–1.5)
Methemoglobin: <0.7 % (ref 0.0–1.5)
O2 Saturation: 96.9 %
Total hemoglobin: 11.4 g/dL — ABNORMAL LOW (ref 12.0–16.0)

## 2023-07-04 LAB — LEGIONELLA PNEUMOPHILA SEROGP 1 UR AG: L. pneumophila Serogp 1 Ur Ag: NEGATIVE

## 2023-07-04 LAB — PROCALCITONIN: Procalcitonin: 2.69 ng/mL

## 2023-07-04 LAB — GLUCOSE, CAPILLARY
Glucose-Capillary: 162 mg/dL — ABNORMAL HIGH (ref 70–99)
Glucose-Capillary: 213 mg/dL — ABNORMAL HIGH (ref 70–99)

## 2023-07-04 LAB — BRAIN NATRIURETIC PEPTIDE: B Natriuretic Peptide: 2096.7 pg/mL — ABNORMAL HIGH (ref 0.0–100.0)

## 2023-07-04 LAB — VANCOMYCIN, RANDOM: Vancomycin Rm: 24 ug/mL

## 2023-07-04 MED ORDER — SODIUM CHLORIDE 0.9 % IV SOLN: INTRAVENOUS | Status: AC | PRN

## 2023-07-04 MED ORDER — ORAL CARE MOUTH RINSE
15.0000 mL | OROMUCOSAL | Status: DC
  Administered 2023-07-04 – 2023-07-11 (×27): 15 mL via OROMUCOSAL

## 2023-07-04 MED ORDER — VANCOMYCIN HCL IN DEXTROSE 1-5 GM/200ML-% IV SOLN
1000.0000 mg | INTRAVENOUS | Status: AC
  Administered 2023-07-04 – 2023-07-06 (×2): 1000 mg via INTRAVENOUS
  Filled 2023-07-04 (×2): qty 200

## 2023-07-04 MED ORDER — SODIUM CHLORIDE 0.9 % IV BOLUS
500.0000 mL | Freq: Once | INTRAVENOUS | Status: AC
  Administered 2023-07-04: 500 mL via INTRAVENOUS

## 2023-07-04 MED ORDER — ALBUMIN HUMAN 25 % IV SOLN
25.0000 g | Freq: Four times a day (QID) | INTRAVENOUS | Status: AC
  Administered 2023-07-04 (×3): 25 g via INTRAVENOUS
  Filled 2023-07-04 (×3): qty 100

## 2023-07-04 MED ORDER — ONDANSETRON HCL 4 MG/2ML IJ SOLN
4.0000 mg | Freq: Four times a day (QID) | INTRAMUSCULAR | Status: DC | PRN
  Administered 2023-07-04: 4 mg via INTRAVENOUS
  Filled 2023-07-04: qty 2

## 2023-07-04 MED ORDER — ORAL CARE MOUTH RINSE
15.0000 mL | OROMUCOSAL | Status: DC | PRN
Start: 2023-07-04 — End: 2023-07-12

## 2023-07-04 NOTE — Progress Notes (Signed)
 eLink Physician-Brief Progress Note Patient Name: Mary [name] DOB: 1946-01-04 MRN: [id number]   Date of Service  07/04/2023  HPI/Events of Note  78 year old female with a history of heart failure with reduced ejection fraction, chronic lymphedema and morbid obesity who presented in septic shock with lactic acidosis for multifocal pneumonia.  Patient has had oliguria all day today with 130 cc out, BNP elevated, CVP 7-10, on minimal norepinephrine .  eICU Interventions  Initial plan for the team was [facility] IVF.  These are now off.  Favor euvolemia for now.  No intervention, somewhat improving creatinine.     Intervention Category Intermediate Interventions: Oliguria - evaluation and management  Fallou Hulbert 07/04/2023, 3:16 AM

## 2023-07-04 NOTE — Procedures (Signed)
 Arterial Line Insertion Start/End6/20/2025 11:45 AM, 07/04/2023 11:52 AM  Patient location: ICU. Preanesthetic checklist: patient identified, site marked, risks and benefits discussed, surgical consent, monitors and equipment checked and timeout performed Right, radial was placed Catheter size: 20 G Hand hygiene performed  and maximum sterile barriers used  Allen's test indicative of satisfactory collateral circulation Attempts: 1 Procedure performed without using ultrasound guided technique. Following insertion, Biopatch and dressing applied. Post procedure assessment: normal  Patient tolerated the procedure well with no immediate complications.

## 2023-07-04 NOTE — Progress Notes (Signed)
 Pharmacy Antibiotic Note  Mary [name] is a 78 y.o. female admitted on 07/01/2023 with sepsis/PNA.  Pharmacy has been consulted for vancomycin  and Zosyn  dosing.  Parainfluenza positive.   Renal function is improving slowly overall - SCr down 3.65 (was 1.1 in 2023) Vancomycin  random level is 24 mcg/mL and expect level to be < 20 mcg/mL later today to redose.  Plan: Switch to AUC dosing Schedule vanc 1gm IV Q48H for AUC 496 using SCr 3.65 - start this PM Continue Zosyn  2.25g IV Q8H Monitor renal fxn, clinical progress, vanc levels pending renal fxn Abx through 6/23 per CCM  Height: 5' 7 (170.2 cm) Weight: 133.3 kg (293 lb 14 oz) IBW/kg (Calculated) : 61.6  Temp (24hrs), Avg:97.4 F (36.3 C), Min:97 F (36.1 C), Max:97.8 F (36.6 C)  Recent Labs  Lab 07/01/23 1111 07/01/23 1121 07/01/23 1750 07/02/23 0518 07/02/23 1026 07/02/23 1220 07/03/23 0509 07/03/23 1243 07/04/23 0554  WBC 12.1*  --   --  12.3*  --   --  12.1*  --  10.4  CREATININE 4.37* 4.20*  --  3.82*  --   --   --  3.40* 3.65*  LATICACIDVEN  --  3.8* 2.7*  --  2.1* 2.4*  --   --   --   VANCORANDOM  --   --   --   --   --   --  13  --  24    Estimated Creatinine Clearance: 19 mL/min (A) (by C-G formula based on SCr of 3.65 mg/dL (H)).    No Known Allergies  Cefepime  and metronidazole  x 1 6/17 Vanc 6/17 >>  Zosyn  6/18 >>    6/17 vanc 2gm at 1240 6/19 VR = 13 mcg/mL >> 1500mg  given at 1000 (SCr 3.4) 6/20 VR = 24 mcg/mL (SCr 3.65)   6/17 MRSA PCR - positive 6/17 BCx - NGTD 6/17 UCx - lactobacillus  6/17 sputum cx: sent 6/18 RVP PCR - parainfluenza  Mary [name] D. Lendell, PharmD, BCPS, BCCCP 07/04/2023, 1:15 PM

## 2023-07-04 NOTE — Progress Notes (Signed)
 NAME:  Mary [name], MRN:  [id number], DOB:  1945/09/11, LOS: 3 ADMISSION DATE:  07/01/2023, CONSULTATION DATE:  07/01/2023 REFERRING MD:  Darra, EDP CHIEF COMPLAINT:  shock, pneumonia  History of Present Illness:    78 year old female with PMH of former smoker (quit 2016), HTN, HFpEF, DM, immobility, chronic lymphedema, hypertensive retinopathy, glaucoma, central retinal vein occlusion, hyperlipidemia, hypothyroidism, GERD, chronic pain, and obesity who presented to the emergency department s/p fall from bed.  Presents from assisted living.  Has state DNR form.  Pt reports they were trying to get her out of bed and she fell vs slid to ground.  Pt denies hitting her head or getting dizziness but somehow ended up face down and could not breath.  Pt complains currently of right posterior hip soreness.  Also reports increasing congested productive cough over last several days after taking a cold shower but denies any SOB or fever.  Had been drinking but has no appetite.  Denies N/V/D, CP, UTI symptoms, headache, or current dizziness.  Has soreness in her abd since Saturday pt attributes to facility having a hard time moving her.    Witnesses to the event noted that while she was laying on the ground, she had a unresponsive episode versus syncope.  EMS was called and on their arrival systolics in the 60s.  She was activated as a level 1 trauma due to fall and hypotension.  Labs notable for Na 133, BUN 102, sCr 4.37, Alk phos 296, Tbili 1.8, WBC 12.1,  Hgb 11.4, I-stat lactic 3.8. CXR with cardiomegaly, and bilateral opacities. CT head and C-spine without acute abnormalities. CT CAP obtained showing multifocal pneumonia and mucous plugging of the lower lobes, possible focal hematoma along the right rectus abdominus. She was started on antibiotics with vancomycin /flagyl /cefepime  for sepsis. Given 1L IVF bolus and started on peripheral NE for ongoing hypotension and presumed septic shock. CCM was consulted  for admission.   Pertinent  Medical History  former smoker (quit 2016), HTN, HFpEF, DM, immobility, chronic lymphedema, hypertensive retinopathy, glaucoma, central retinal vein occlusion, hyperlipidemia, hypothyroidism, GERD, chronic pain, and obesity  Significant Hospital Events: Including procedures, antibiotic start and stop dates in addition to other pertinent events   6/17: admit to CCM for septic shock volume depletion, PNA and also found to have traumatic hematoma involving right rectus abdominus. Starting on broad spec abx vanc and zosyn  6/18 still on NE. POCUS showed collapsible IVC so added back IVFs. Formal ECHO pending. Getting LLE US  to r/o DVT. Working on med rec from [employer]. Started systemic steroids given elevated PSI score for CAP. Checking cortisol prior. Pressor needs remained elevated. ECHO later that afternoon. EF 35-40%) global hypokinesis, gd II diastolic dysfxn, mild red'd RV fxn w/ severely dilated RA and LA, at time of formal echo IVC w/ less than 50% resp variability  6/19 still on pressors   Interim History / Subjective:   No acute issues overnight, noted of decrease in Urine output Patient is without complaints this AM Remains on of levophed  CVP 10  Objective   Blood pressure 99/69, pulse 71, temperature (!) 97 F (36.1 C), temperature source Axillary, resp. rate 12, height 5' 7 (1.702 m), weight 133.3 kg, SpO2 95%. CVP:  [5 mmHg-27 mmHg] 10 mmHg      Intake/Output Summary (Last 24 hours) at 07/04/2023 0843 Last data filed at 07/04/2023 0800 Gross per 24 hour  Intake 1612.55 ml  Output 190 ml  Net 1422.55 ml   [employer]  07/02/23 0344 07/03/23 0500 07/04/23 0500  Weight: 128.5 kg 130.2 kg 133.3 kg   Examination:  General elderly woman, no distress, laying in bed  HENT NCAT no JVD. MMM. Pulm decreased breath sounds but no wheezing or rhonchi or rales Card + systolic murmur Abd soft Ext warm, chronic Lymphedema pulses are palp chronic post  knee left discomfort  Neuro awake oriented appropriate. Moves uppers at baseline. Unable to move lowers (chronically)  GU cl yellow but UOP dropping   Resolved Hospital Problem list    Assessment & Plan:  Septic shock w/ lactic acidosis 2/2 multifocal pneumonia (parainfluenza virus) +/- CAP and suspected UTI (purulent urine in foley); further complicated by newly identified systolic and diastolic CM ? Viral CM?  (EF 35-40%) global hypokinesis, gd II diastolic dysfxn, mild red'd RV fxn w/ severely dilated RA and LA, at time of formal echo IVC w/ less than 50% resp variability  Still on norepi MRSA PCR + Cultures still pending  Plan Day 4 vanc and zosyn , plan for 7 day course of each F/u pending cultures  Procal trending down Cont to titrate peripheral NE  for SBP goal > 65  Give 500mL of fluid Will give albumin  today  Syncope/Unresponsiveness w/ elevated trop I  Presume this is related to her hypotension from septic shock vs ?orthostatic given poor PO intake. However, given age, cardiomegaly will rule out other causes. CT head negative.  No known anticoagulates, normal INR. - currently neurologically at baseline, cont to trend neuro checks plan Cont tele   Diastolic and Systolic Heart failure, unknown if acute vs chronic Plan Will need cardiology evaluation Holding goal directed medical therapy while on vasopressors  Acute hypoxic respiratory failure 2/2 multifocal pneumonia  Multiple lung nodules, ?spiculated in RLL O2 needs:**on room air PSI/PORT score 155. 27-29% mortality CXR a little worse today  Plan Cont pulse ox Wean o2 for sats >92% Abx as above Day 3 of 7 pred for CAP given PSI > 130  F/u urine legionella duonebs q4prn Cont pulm hygiene with IS, flutter, guaifenesin   will need repeat CT in 3 months to ensure clearing of nodules, is higher risk given prior smoking hx Am cxr  Acute/Chronic Renal failure - BUN/cr improved w/ resuscitation effort, UNa 29 w/ FENA 0.9  still suggesting pre-renal status. The renal US  showed several small bilateral cysts but no hydro. Was still volume depleted as of 6/18 am, but responded to IVFs. UOP decreasing Plan F/u chem Strict I&O Renal adjust med KVO IVFs  Fluid and electrolyte imbalance: hyponatremia, hypocalcemia  Plan Awaiting am labs. Replace as indicated  Abnormal alk PO4.  T-bili marginally elevated. Ast/alt nml. CT although poor contrast did not GB distention. She is bedbound essentially suspect this is related Plan Continue to monitor  Rectus abdominus hematoma, POA  - watch clinically, reports pain since Saturday after she was moved.  Hgb stable, started [place] heparin  6/18 Plan Trend CBC  DMT2 Awaiting am check thus far at goal  Plan Ssi  Goal 140-180   HTN HLD Plan Resume home meds when approp   Hypothyroidism Plan synthroid    Chronic debility, Obesity- BMI 57.95, Lymphedema  and Chronic pain  - non wt bearing at baseline  Plan Holding her home diuretics Left leg US  r/o DVT given pain   GOC - pt wishes to remain DNR/ DNI, ok with pressors for now.     Best Practice (right click and Reselect all SmartList Selections daily)   Diet/type: Regular consistency (see  orders) DVT prophylaxis: prophylactic heparin   Pressure ulcer(s). Clinically undetermined if the pressure injury was present on admission GI prophylaxis: N/A Lines: N/A Foley:  Yes, and it is still needed Code Status:  DNR Last date of multidisciplinary goals of care discussion [07/01/2023]  Daughter and son at bedside, updated on plan of care  Critical care time: 1 minutes    Dorn Chill, MD  Pulmonary & Critical Care Office: [phone]   See Amion for personal pager PCCM on call pager [phone] until 7pm. Please call Elink 7p-7a. [phone]

## 2023-07-05 DIAGNOSIS — R6521 Severe sepsis with septic shock: Secondary | ICD-10-CM | POA: Diagnosis not present

## 2023-07-05 DIAGNOSIS — I5040 Unspecified combined systolic (congestive) and diastolic (congestive) heart failure: Secondary | ICD-10-CM | POA: Diagnosis not present

## 2023-07-05 DIAGNOSIS — R55 Syncope and collapse: Secondary | ICD-10-CM | POA: Diagnosis not present

## 2023-07-05 DIAGNOSIS — A419 Sepsis, unspecified organism: Secondary | ICD-10-CM | POA: Diagnosis not present

## 2023-07-05 LAB — COMPREHENSIVE METABOLIC PANEL WITH GFR
ALT: 15 U/L (ref 0–44)
AST: 14 U/L — ABNORMAL LOW (ref 15–41)
Albumin: 3 g/dL — ABNORMAL LOW (ref 3.5–5.0)
Alkaline Phosphatase: 136 U/L — ABNORMAL HIGH (ref 38–126)
Anion gap: 16 — ABNORMAL HIGH (ref 5–15)
BUN: 92 mg/dL — ABNORMAL HIGH (ref 8–23)
CO2: 19 mmol/L — ABNORMAL LOW (ref 22–32)
Calcium: 8.9 mg/dL (ref 8.9–10.3)
Chloride: 97 mmol/L — ABNORMAL LOW (ref 98–111)
Creatinine, Ser: 3.93 mg/dL — ABNORMAL HIGH (ref 0.44–1.00)
GFR, Estimated: 11 mL/min — ABNORMAL LOW (ref >60)
Glucose, Bld: 163 mg/dL — ABNORMAL HIGH (ref 70–99)
Potassium: 4.6 mmol/L (ref 3.5–5.1)
Sodium: 132 mmol/L — ABNORMAL LOW (ref 135–145)
Total Bilirubin: 1.5 mg/dL — ABNORMAL HIGH (ref 0.0–1.2)
Total Protein: 6.2 g/dL — ABNORMAL LOW (ref 6.5–8.1)

## 2023-07-05 LAB — CBC WITH DIFFERENTIAL/PLATELET
Abs Immature Granulocytes: 0.1 10*3/uL — ABNORMAL HIGH (ref 0.00–0.07)
Basophils Absolute: 0 10*3/uL (ref 0.0–0.1)
Basophils Relative: 0 %
Eosinophils Absolute: 0 10*3/uL (ref 0.0–0.5)
Eosinophils Relative: 0 %
HCT: 32.6 % — ABNORMAL LOW (ref 36.0–46.0)
Hemoglobin: 10.6 g/dL — ABNORMAL LOW (ref 12.0–15.0)
Immature Granulocytes: 1 %
Lymphocytes Relative: 11 %
Lymphs Abs: 1.2 10*3/uL (ref 0.7–4.0)
MCH: 29.3 pg (ref 26.0–34.0)
MCHC: 32.5 g/dL (ref 30.0–36.0)
MCV: 90.1 fL (ref 80.0–100.0)
Monocytes Absolute: 1.2 10*3/uL — ABNORMAL HIGH (ref 0.1–1.0)
Monocytes Relative: 11 %
Neutro Abs: 8.5 10*3/uL — ABNORMAL HIGH (ref 1.7–7.7)
Neutrophils Relative %: 77 %
Platelets: 404 10*3/uL — ABNORMAL HIGH (ref 150–400)
RBC: 3.62 MIL/uL — ABNORMAL LOW (ref 3.87–5.11)
RDW: 16.7 % — ABNORMAL HIGH (ref 11.5–15.5)
WBC: 11 10*3/uL — ABNORMAL HIGH (ref 4.0–10.5)
nRBC: 1 % — ABNORMAL HIGH (ref 0.0–0.2)

## 2023-07-05 LAB — PROCALCITONIN: Procalcitonin: 2.03 ng/mL

## 2023-07-05 LAB — GLUCOSE, CAPILLARY
Glucose-Capillary: 175 mg/dL — ABNORMAL HIGH (ref 70–99)
Glucose-Capillary: 192 mg/dL — ABNORMAL HIGH (ref 70–99)
Glucose-Capillary: 157 mg/dL — ABNORMAL HIGH (ref 70–99)
Glucose-Capillary: 210 mg/dL — ABNORMAL HIGH (ref 70–99)
Glucose-Capillary: 150 mg/dL — ABNORMAL HIGH (ref 70–99)
Glucose-Capillary: 176 mg/dL — ABNORMAL HIGH (ref 70–99)

## 2023-07-05 LAB — BRAIN NATRIURETIC PEPTIDE: B Natriuretic Peptide: 3129.1 pg/mL — ABNORMAL HIGH (ref 0.0–100.0)

## 2023-07-05 MED ORDER — MIDODRINE HCL 5 MG PO TABS
10.0000 mg | ORAL_TABLET | Freq: Three times a day (TID) | ORAL | Status: DC
  Administered 2023-07-05 – 2023-07-07 (×6): 10 mg via ORAL
  Filled 2023-07-05 (×6): qty 2

## 2023-07-05 NOTE — Plan of Care (Signed)
  Problem: Education: Goal: Ability to describe self-care measures that may prevent or decrease complications (Diabetes Survival Skills Education) will improve Outcome: Progressing Goal: Individualized Educational Video(s) Outcome: Progressing   Problem: Coping: Goal: Ability to adjust to condition or change in health will improve Outcome: Progressing   Problem: Fluid Volume: Goal: Ability to maintain a balanced intake and output will improve Outcome: Progressing   Problem: Health Behavior/Discharge Planning: Goal: Ability to identify and utilize available resources and services will improve Outcome: Progressing Goal: Ability to manage health-related needs will improve Outcome: Progressing   Problem: Metabolic: Goal: Ability to maintain appropriate glucose levels will improve Outcome: Progressing   Problem: Nutritional: Goal: Maintenance of adequate nutrition will improve Outcome: Progressing Goal: Progress toward achieving an optimal weight will improve Outcome: Progressing   Problem: Tissue Perfusion: Goal: Adequacy of tissue perfusion will improve Outcome: Progressing   Problem: Health Behavior/Discharge Planning: Goal: Ability to manage health-related needs will improve Outcome: Progressing   Problem: Education: Goal: Knowledge of General Education information will improve Description: Including pain rating scale, medication(s)/side effects and non-pharmacologic comfort measures Outcome: Progressing

## 2023-07-05 NOTE — Progress Notes (Signed)
 NAME:  Mary [name], MRN:  [id number], DOB:  1945-09-06, LOS: 4 ADMISSION DATE:  07/01/2023, CONSULTATION DATE:  07/01/2023 REFERRING MD:  Darra, EDP CHIEF COMPLAINT:  shock, pneumonia  History of Present Illness:    78 year old female with PMH of former smoker (quit 2016), HTN, HFpEF, DM, immobility, chronic lymphedema, hypertensive retinopathy, glaucoma, central retinal vein occlusion, hyperlipidemia, hypothyroidism, GERD, chronic pain, and obesity who presented to the emergency department s/p fall from bed.  Presents from assisted living.  Has state DNR form.  Pt reports they were trying to get her out of bed and she fell vs slid to ground.  Pt denies hitting her head or getting dizziness but somehow ended up face down and could not breath.  Pt complains currently of right posterior hip soreness.  Also reports increasing congested productive cough over last several days after taking a cold shower but denies any SOB or fever.  Had been drinking but has no appetite.  Denies N/V/D, CP, UTI symptoms, headache, or current dizziness.  Has soreness in her abd since Saturday pt attributes to facility having a hard time moving her.    Witnesses to the event noted that while she was laying on the ground, she had a unresponsive episode versus syncope.  EMS was called and on their arrival systolics in the 60s.  She was activated as a level 1 trauma due to fall and hypotension.  Labs notable for Na 133, BUN 102, sCr 4.37, Alk phos 296, Tbili 1.8, WBC 12.1,  Hgb 11.4, I-stat lactic 3.8. CXR with cardiomegaly, and bilateral opacities. CT head and C-spine without acute abnormalities. CT CAP obtained showing multifocal pneumonia and mucous plugging of the lower lobes, possible focal hematoma along the right rectus abdominus. She was started on antibiotics with vancomycin /flagyl /cefepime  for sepsis. Given 1L IVF bolus and started on peripheral NE for ongoing hypotension and presumed septic shock. CCM was consulted  for admission.   Pertinent  Medical History  former smoker (quit 2016), HTN, HFpEF, DM, immobility, chronic lymphedema, hypertensive retinopathy, glaucoma, central retinal vein occlusion, hyperlipidemia, hypothyroidism, GERD, chronic pain, and obesity  Significant Hospital Events: Including procedures, antibiotic start and stop dates in addition to other pertinent events   6/17: admit to CCM for septic shock volume depletion, PNA and also found to have traumatic hematoma involving right rectus abdominus. Starting on broad spec abx vanc and zosyn  6/18 still on NE. POCUS showed collapsible IVC so added back IVFs. Formal ECHO pending. Getting LLE US  to r/o DVT. Working on med rec from [employer]. Started systemic steroids given elevated PSI score for CAP. Checking cortisol prior. Pressor needs remained elevated. ECHO later that afternoon. EF 35-40%) global hypokinesis, gd II diastolic dysfxn, mild red'd RV fxn w/ severely dilated RA and LA, at time of formal echo IVC w/ less than 50% resp variability  6/19 still on pressors 6/20 still on minimal pressors, arterial line placed - confirmed BP. Albumin  given. IV fluids given.  Interim History / Subjective:   No acute issues overnight  Patient is without complaints this AM Remains on of levophed  CVP 13-17 [place] yesterday 96%  Objective   Blood pressure 112/78, pulse 72, temperature (!) 96.8 F (36 C), temperature source Axillary, resp. rate (!) 9, height 5' 7 (1.702 m), weight 134.3 kg, SpO2 97%. CVP:  [8 mmHg-22 mmHg] 12 mmHg      Intake/Output Summary (Last 24 hours) at 07/05/2023 0736 Last data filed at 07/05/2023 0700 Gross per 24 hour  Intake  1255.67 ml  Output 270 ml  Net 985.67 ml   Filed Weights   07/03/23 0500 07/04/23 0500 07/05/23 0408  Weight: 130.2 kg 133.3 kg 134.3 kg   Examination:  General elderly woman, no distress, laying in bed  HENT NCAT no JVD. MMM. Pulm clear to auscultation, no wheezing Card + systolic  murmur Abd soft Ext warm, chronic Lymphedema pulses are palp chronic post knee left discomfort  Neuro awake oriented appropriate. Moves uppers at baseline. Unable to move lowers (chronically)  GU foley in place, clear yellow urine  Resolved Hospital Problem list    Assessment & Plan:  Septic shock w/ lactic acidosis 2/2 multifocal pneumonia (parainfluenza virus) +/- CAP and suspected UTI (purulent urine in foley); further complicated by newly identified systolic and diastolic CM ? Viral CM?  (EF 35-40%) global hypokinesis, gd II diastolic dysfxn, mild red'd RV fxn w/ severely dilated RA and LA, at time of formal echo IVC w/ less than 50% resp variability  Still on norepi MRSA PCR + Cultures still pending  Plan Day 4 vanc and zosyn , plan for 7 day course of each F/u pending cultures  Procal trending down Cont to titrate peripheral NE  for SBP goal > 65  Add midodrine  10mg  TID today  Syncope/Unresponsiveness w/ elevated trop I  Presume this is related to her hypotension from septic shock vs ?orthostatic given poor PO intake. However, given age, cardiomegaly will rule out other causes. CT head negative.  No known anticoagulates, normal INR. - currently neurologically at baseline, cont to trend neuro checks plan Cont tele   Diastolic and Systolic Heart failure, unknown if acute vs chronic Plan Will need cardiology evaluation Holding goal directed medical therapy while on vasopressors  Acute hypoxic respiratory failure 2/2 multifocal pneumonia, parainfluenza positive Multiple lung nodules, ?spiculated in RLL Plan Cont pulse ox Wean o2 for sats >92% Abx as above Day 4 of 7 pred for CAP given PSI > 130  duonebs q4prn Cont pulm hygiene with IS, flutter, guaifenesin   will need repeat CT in 3 months to ensure clearing of nodules, is higher risk given prior smoking hx Am cxr  Acute/Chronic Renal failure - BUN/cr improved w/ resuscitation effort, UNa 29 w/ FENA 0.9 still suggesting  pre-renal status. The renal US  showed several small bilateral cysts but no hydro. Was still volume depleted as of 6/18 am, but responded to IVFs. Given more volume 6/20 Plan F/u chem Strict I&O Renal adjust med KVO IVFs  Fluid and electrolyte imbalance: hyponatremia, hypocalcemia  Plan Awaiting am labs. Replace as indicated  Abnormal alk PO4.  T-bili marginally elevated. Ast/alt nml. CT although poor contrast did not GB distention. She is bedbound essentially suspect this is related Plan Continue to monitor  Rectus abdominus hematoma, POA  - watch clinically, reports pain since Saturday after she was moved.  Hgb stable, started [place] heparin  6/18 Plan Trend CBC  DMT2 Awaiting am check thus far at goal  Plan Ssi  Goal 140-180   HTN HLD Plan Resume home meds when approp   Hypothyroidism Plan synthroid    Chronic debility, Obesity- BMI 57.95, Lymphedema  and Chronic pain  - non wt bearing at baseline  Plan Holding her home diuretics Left leg US  r/o DVT given pain   GOC - pt wishes to remain DNR/ DNI, ok with pressors for now.     Best Practice (right click and Reselect all SmartList Selections daily)   Diet/type: Regular consistency (see orders) DVT prophylaxis: prophylactic heparin   Pressure  ulcer(s). Clinically undetermined if the pressure injury was present on admission GI prophylaxis: N/A Lines: N/A Foley:  Yes, and it is still needed Code Status:  DNR Last date of multidisciplinary goals of care discussion [07/01/2023]  Daughter and son at bedside, updated on plan of care  Critical care time: 47 minutes    Dorn Chill, MD [facility] Office: [phone]   See Amion for personal pager PCCM on call pager [phone] until 7pm. Please call Elink 7p-7a. [phone]

## 2023-07-06 DIAGNOSIS — R6521 Severe sepsis with septic shock: Secondary | ICD-10-CM | POA: Diagnosis not present

## 2023-07-06 DIAGNOSIS — R55 Syncope and collapse: Secondary | ICD-10-CM | POA: Diagnosis not present

## 2023-07-06 DIAGNOSIS — A419 Sepsis, unspecified organism: Secondary | ICD-10-CM | POA: Diagnosis not present

## 2023-07-06 DIAGNOSIS — I5040 Unspecified combined systolic (congestive) and diastolic (congestive) heart failure: Secondary | ICD-10-CM | POA: Diagnosis not present

## 2023-07-06 LAB — CULTURE, BLOOD (ROUTINE X 2)
Culture: NO GROWTH
Culture: NO GROWTH
Special Requests: ADEQUATE

## 2023-07-06 LAB — BASIC METABOLIC PANEL WITH GFR
Anion gap: 16 — ABNORMAL HIGH (ref 5–15)
BUN: 95 mg/dL — ABNORMAL HIGH (ref 8–23)
CO2: 21 mmol/L — ABNORMAL LOW (ref 22–32)
Calcium: 9 mg/dL (ref 8.9–10.3)
Chloride: 96 mmol/L — ABNORMAL LOW (ref 98–111)
Creatinine, Ser: 4.03 mg/dL — ABNORMAL HIGH (ref 0.44–1.00)
GFR, Estimated: 11 mL/min — ABNORMAL LOW (ref >60)
Glucose, Bld: 137 mg/dL — ABNORMAL HIGH (ref 70–99)
Potassium: 4.7 mmol/L (ref 3.5–5.1)
Sodium: 133 mmol/L — ABNORMAL LOW (ref 135–145)

## 2023-07-06 LAB — CBC WITH DIFFERENTIAL/PLATELET
Abs Immature Granulocytes: 0.08 10*3/uL — ABNORMAL HIGH (ref 0.00–0.07)
Basophils Absolute: 0 10*3/uL (ref 0.0–0.1)
Basophils Relative: 0 %
Eosinophils Absolute: 0 10*3/uL (ref 0.0–0.5)
Eosinophils Relative: 0 %
HCT: 33.2 % — ABNORMAL LOW (ref 36.0–46.0)
Hemoglobin: 10.9 g/dL — ABNORMAL LOW (ref 12.0–15.0)
Immature Granulocytes: 1 %
Lymphocytes Relative: 9 %
Lymphs Abs: 1 10*3/uL (ref 0.7–4.0)
MCH: 29.7 pg (ref 26.0–34.0)
MCHC: 32.8 g/dL (ref 30.0–36.0)
MCV: 90.5 fL (ref 80.0–100.0)
Monocytes Absolute: 1.1 10*3/uL — ABNORMAL HIGH (ref 0.1–1.0)
Monocytes Relative: 10 %
Neutro Abs: 8.9 10*3/uL — ABNORMAL HIGH (ref 1.7–7.7)
Neutrophils Relative %: 80 %
Platelets: 449 10*3/uL — ABNORMAL HIGH (ref 150–400)
RBC: 3.67 MIL/uL — ABNORMAL LOW (ref 3.87–5.11)
RDW: 16.9 % — ABNORMAL HIGH (ref 11.5–15.5)
WBC: 11.1 10*3/uL — ABNORMAL HIGH (ref 4.0–10.5)
nRBC: 1.3 % — ABNORMAL HIGH (ref 0.0–0.2)

## 2023-07-06 LAB — URINALYSIS, ROUTINE W REFLEX MICROSCOPIC
Bilirubin Urine: NEGATIVE
Glucose, UA: NEGATIVE mg/dL
Ketones, ur: NEGATIVE mg/dL
Nitrite: NEGATIVE
Protein, ur: 100 mg/dL — AB
Specific Gravity, Urine: 1.021 (ref 1.005–1.030)
WBC, UA: >50 WBC/hpf (ref 0–5)
pH: 5 (ref 5.0–8.0)

## 2023-07-06 LAB — GLUCOSE, CAPILLARY
Glucose-Capillary: 124 mg/dL — ABNORMAL HIGH (ref 70–99)
Glucose-Capillary: 127 mg/dL — ABNORMAL HIGH (ref 70–99)
Glucose-Capillary: 142 mg/dL — ABNORMAL HIGH (ref 70–99)

## 2023-07-06 MED ORDER — POLYETHYLENE GLYCOL 3350 17 G PO PACK
17.0000 g | PACK | Freq: Two times a day (BID) | ORAL | Status: DC
  Administered 2023-07-06 – 2023-07-11 (×6): 17 g via ORAL
  Filled 2023-07-06 (×7): qty 1

## 2023-07-06 NOTE — Progress Notes (Signed)
 Pt adm to the unit from 4N icu. VSS, telemetry applied and verified with CCMd. Foley intact and unclamped. Pt oriented to the unit and room call light. Report given to oncoming RN. MYRTIS Val Roof RN   07/06/23 1900  Vitals  BP 123/86  MAP (mmHg) 98  Pulse Rate 68  ECG Heart Rate 69  Resp 16  MEWS COLOR  MEWS Score Color Green  Oxygen Therapy  SpO2 94 %  O2 Device Room Air  MEWS Score  MEWS Temp 0  MEWS Systolic 0  MEWS Pulse 0  MEWS RR 0  MEWS LOC 0  MEWS Score 0

## 2023-07-06 NOTE — Consult Note (Signed)
 [facility] Renal Consultation Note  Requesting MD: Dewald Indication for Consultation:  ? AKI vs CKD  HPI:  Mary [name] is a 78 y.o. female with past medical history significant for HTN, DM, chronic lymphedema, obesity and chronic pain. She resides in a facility and is immoble... bedbound ?  There are no old records in the EPIC system or in care everywhere.  She was brought in from facility-  kind of found down-  BP was low-  labs had multiple derangements including crt of 4.37, elevated lactate, WBC 12.  Felt to have septic shock-  admitted to ICU- pan culture and place on zosyn  and vanc-  infection felt to be from pulmonary source vs UTI-  urine with 100 of protein and TNTC WBC and RBC- CT shows bilateral renal atrophy with numerous bilateral renal cysts but no hydro.  Over the first few days of hospitalization her renal function improved-  to nadir of 3.4 on 6/19-   but then began to trend up.  Urine output is not robust.  Has had some soft BPs-  intermittent pressor requirements.  Patient not really able to provide history-  thinks someone told her once that her kidneys were down-  she can't tell me the last time she stood up-  is seeming to try and blame the SNF for some of this-   wants to be at home. Appears to have bene on cozaar as OP   Creatinine, Ser  Date/Time Value Ref Range Status  07/06/2023 08:23 AM 4.03 (H) 0.44 - 1.00 mg/dL Final  93/78/7974 91:56 AM 3.93 (H) 0.44 - 1.00 mg/dL Final  93/79/7974 94:45 AM 3.65 (H) 0.44 - 1.00 mg/dL Final  93/80/7974 87:56 PM 3.40 (H) 0.44 - 1.00 mg/dL Final  93/81/7974 94:81 AM 3.82 (H) 0.44 - 1.00 mg/dL Final  93/82/7974 88:78 AM 4.20 (H) 0.44 - 1.00 mg/dL Final  93/82/7974 88:88 AM 4.37 (H) 0.44 - 1.00 mg/dL Final     PMHx:  History reviewed. No pertinent past medical history.   Family Hx: History reviewed. No pertinent family history.  Social History:  reports that she has never smoked. She has never used smokeless  tobacco. No history on file for alcohol  use and drug use.  Allergies: No Known Allergies  Medications: Prior to Admission medications   Medication Sig Start Date End Date Taking? Authorizing Provider  Amino Acids-Protein Hydrolys (PRO-STAT) LIQD Take 30 mLs by mouth daily.   Yes [provider]  aspirin EC 81 MG tablet Take 81 mg by mouth daily. Swallow whole.   Yes [provider]  bacitracin 500 UNIT/GM ointment Apply 1 Application topically 2 (two) times daily. Apply to bilateral lower leg for venous stasis   Yes [provider]  brimonidine  (ALPHAGAN ) 0.2 % ophthalmic solution Place 1 drop into both eyes 3 (three) times daily.   Yes [provider]  cetirizine (ZYRTEC) 10 MG tablet Take 10 mg by mouth at bedtime.   Yes [provider]  Cholecalciferol 50 MCG (2000 UT) TABS Take 1 tablet by mouth daily.   Yes [provider]  Emollient (BAG BALM) OINT ointment Apply 1 Application topically in the morning, at noon, in the evening, and at bedtime. Apply to bilateral legs   Yes [provider]  fluticasone (FLONASE) 50 MCG/ACT nasal spray Place 1 spray into both nostrils daily.   Yes [provider]  HYDROcodone-acetaminophen  (NORCO) 10-325 MG tablet Take 1 tablet by mouth in the morning, at  noon, and at bedtime.   Yes [provider]  HYDROcodone-acetaminophen  (NORCO) 10-325 MG tablet Take 1 tablet by mouth 2 (two) times daily as needed (breakthrough pain). To be given as needed in addition to hydrocodone/apap 10-325 three times daily scheduled   Yes [provider]  Ipratropium-Albuterol (COMBIVENT RESPIMAT) 20-100 MCG/ACT AERS respimat Inhale 1 puff into the lungs every 6 (six) hours.   Yes [provider]  levothyroxine  (SYNTHROID ) 125 MCG tablet Take 125 mcg by mouth daily before breakfast.   Yes [provider]  loperamide (IMODIUM) 2 MG capsule Take 4 mg by mouth every 12 (twelve)  hours as needed for diarrhea or loose stools.   Yes [provider]  losartan (COZAAR) 100 MG tablet Take 100 mg by mouth daily. Hold for systolic blood pressure <   Yes [provider]  magnesium hydroxide (MILK OF MAGNESIA) 400 MG/5ML suspension Take 30 mLs by mouth daily as needed for mild constipation. Give no more than 2 days in a row   Yes [provider]  metoprolol succinate (TOPROL-XL) 50 MG 24 hr tablet Take 50 mg by mouth daily. Take with or immediately following a meal.   Yes [provider]  miconazole (MICOTIN) 2 % cream Apply 1 Application topically in the morning, at noon, in the evening, and at bedtime. Apply to affected area   Yes [provider]  nystatin powder Apply 1 Application topically 2 (two) times daily. To skin folds   Yes [provider]  ondansetron  (ZOFRAN ) 4 MG tablet Take 4 mg by mouth 2 (two) times daily as needed for nausea or vomiting.   Yes [provider]  Polyethyl Glycol-Propyl Glycol (SYSTANE) 0.4-0.3 % SOLN Place 1 drop into both eyes every 12 (twelve) hours as needed (dry eyes).   Yes [provider]  pravastatin  (PRAVACHOL ) 40 MG tablet Take 40 mg by mouth daily.   Yes [provider]  saccharomyces boulardii (FLORASTOR) 250 MG capsule Take 250 mg by mouth 2 (two) times daily.   Yes [provider]  timolol  (BETIMOL ) 0.5 % ophthalmic solution Place 1 drop into the right eye 2 (two) times daily.   Yes [provider]  Torsemide 40 MG TABS Take 40 mg by mouth daily.   Yes [provider]    I have reviewed the patient's current medications.  Labs:  Results for orders placed or performed during the hospital encounter of 07/01/23 (from the past 48 hours)  Glucose, capillary     Status: Abnormal   Collection Time: 07/04/23  4:19 PM  Result Value Ref Range   Glucose-Capillary 192 (H) 70 - 99 mg/dL    Comment: Glucose reference range applies  only to samples taken after fasting for at least 8 hours.  Glucose, capillary     Status: Abnormal   Collection Time: 07/04/23  9:18 PM  Result Value Ref Range   Glucose-Capillary 213 (H) 70 - 99 mg/dL    Comment: Glucose reference range applies only to samples taken after fasting for at least 8 hours.  Procalcitonin     Status: None   Collection Time: 07/05/23  4:10 AM  Result Value Ref Range   Procalcitonin 2.03 ng/mL    Comment:        Interpretation: PCT > 2 ng/mL: Systemic infection (sepsis) is likely, unless other causes are known. (NOTE)       Sepsis PCT Algorithm  Lower Respiratory Tract                                      Infection PCT Algorithm    ----------------------------     ----------------------------         PCT < 0.25 ng/mL                PCT < 0.10 ng/mL          Strongly encourage             Strongly discourage   discontinuation of antibiotics    initiation of antibiotics    ----------------------------     -----------------------------       PCT 0.25 - 0.50 ng/mL            PCT 0.10 - 0.25 ng/mL               OR       >80% decrease in PCT            Discourage initiation of                                            antibiotics      Encourage discontinuation           of antibiotics    ----------------------------     -----------------------------         PCT >= 0.50 ng/mL              PCT 0.26 - 0.50 ng/mL               AND       <80% decrease in PCT              Encourage initiation of                                             antibiotics       Encourage continuation           of antibiotics    ----------------------------     -----------------------------        PCT >= 0.50 ng/mL                  PCT > 0.50 ng/mL               AND         increase in PCT                  Strongly encourage                                      initiation of antibiotics    Strongly encourage escalation           of antibiotics                                      -----------------------------  PCT <= 0.25 ng/mL                                                 OR                                        > 80% decrease in PCT                                      Discontinue / Do not initiate                                             antibiotics  Performed at [facility], [address] [place], [place] [address]   Brain natriuretic peptide     Status: Abnormal   Collection Time: 07/05/23  4:10 AM  Result Value Ref Range   B Natriuretic Peptide 3,129.1 (H) 0.0 - 100.0 pg/mL    Comment: Performed at [facility], [address] [place], [place] [address]  Glucose, capillary     Status: Abnormal   Collection Time: 07/05/23  7:31 AM  Result Value Ref Range   Glucose-Capillary 157 (H) 70 - 99 mg/dL    Comment: Glucose reference range applies only to samples taken after fasting for at least 8 hours.  Comprehensive metabolic panel     Status: Abnormal   Collection Time: 07/05/23  8:43 AM  Result Value Ref Range   Sodium 132 (L) 135 - 145 mmol/L   Potassium 4.6 3.5 - 5.1 mmol/L   Chloride 97 (L) 98 - 111 mmol/L   CO2 19 (L) 22 - 32 mmol/L   Glucose, Bld 163 (H) 70 - 99 mg/dL    Comment: Glucose reference range applies only to samples taken after fasting for at least 8 hours.   BUN 92 (H) 8 - 23 mg/dL   Creatinine, Ser 6.06 (H) 0.44 - 1.00 mg/dL   Calcium  8.9 8.9 - 10.3 mg/dL   Total Protein 6.2 (L) 6.5 - 8.1 g/dL   Albumin  3.0 (L) 3.5 - 5.0 g/dL   AST 14 (L) 15 - 41 U/L   ALT 15 0 - 44 U/L   Alkaline Phosphatase 136 (H) 38 - 126 U/L   Total Bilirubin 1.5 (H) 0.0 - 1.2 mg/dL   GFR, Estimated 11 (L) >60 mL/min    Comment: (NOTE) Calculated using the CKD-EPI Creatinine Equation (2021)    Anion gap 16 (H) 5 - 15    Comment: Performed at [facility], [address], [place], [place] [address]  CBC with Differential/Platelet     Status: Abnormal   Collection Time: 07/05/23   8:43 AM  Result Value Ref Range   WBC 11.0 (H) 4.0 - 10.5 K/uL   RBC 3.62 (L) 3.87 - 5.11 MIL/uL   Hemoglobin 10.6 (L) 12.0 - 15.0 g/dL   HCT 67.3 (L) 63.9 - 53.9 %   MCV 90.1 80.0 - 100.0 fL   MCH 29.3 26.0 - 34.0 pg   MCHC 32.5 30.0 - 36.0  g/dL   RDW 83.2 (H) 88.4 - 84.4 %   Platelets 404 (H) 150 - 400 K/uL   nRBC 1.0 (H) 0.0 - 0.2 %   Neutrophils Relative % 77 %   Neutro Abs 8.5 (H) 1.7 - 7.7 K/uL   Lymphocytes Relative 11 %   Lymphs Abs 1.2 0.7 - 4.0 K/uL   Monocytes Relative 11 %   Monocytes Absolute 1.2 (H) 0.1 - 1.0 K/uL   Eosinophils Relative 0 %   Eosinophils Absolute 0.0 0.0 - 0.5 K/uL   Basophils Relative 0 %   Basophils Absolute 0.0 0.0 - 0.1 K/uL   Immature Granulocytes 1 %   Abs Immature Granulocytes 0.10 (H) 0.00 - 0.07 K/uL    Comment: Performed at [facility], [address], [place], [place] [address]  Glucose, capillary     Status: Abnormal   Collection Time: 07/05/23 11:44 AM  Result Value Ref Range   Glucose-Capillary 210 (H) 70 - 99 mg/dL    Comment: Glucose reference range applies only to samples taken after fasting for at least 8 hours.  Glucose, capillary     Status: Abnormal   Collection Time: 07/05/23  4:37 PM  Result Value Ref Range   Glucose-Capillary 150 (H) 70 - 99 mg/dL    Comment: Glucose reference range applies only to samples taken after fasting for at least 8 hours.  Glucose, capillary     Status: Abnormal   Collection Time: 07/05/23  9:04 PM  Result Value Ref Range   Glucose-Capillary 176 (H) 70 - 99 mg/dL    Comment: Glucose reference range applies only to samples taken after fasting for at least 8 hours.  Glucose, capillary     Status: Abnormal   Collection Time: 07/06/23  8:01 AM  Result Value Ref Range   Glucose-Capillary 124 (H) 70 - 99 mg/dL    Comment: Glucose reference range applies only to samples taken after fasting for at least 8 hours.  Basic metabolic panel     Status: Abnormal   Collection Time: 07/06/23  8:23 AM   Result Value Ref Range   Sodium 133 (L) 135 - 145 mmol/L   Potassium 4.7 3.5 - 5.1 mmol/L   Chloride 96 (L) 98 - 111 mmol/L   CO2 21 (L) 22 - 32 mmol/L   Glucose, Bld 137 (H) 70 - 99 mg/dL    Comment: Glucose reference range applies only to samples taken after fasting for at least 8 hours.   BUN 95 (H) 8 - 23 mg/dL   Creatinine, Ser 5.96 (H) 0.44 - 1.00 mg/dL   Calcium  9.0 8.9 - 10.3 mg/dL   GFR, Estimated 11 (L) >60 mL/min    Comment: (NOTE) Calculated using the CKD-EPI Creatinine Equation (2021)    Anion gap 16 (H) 5 - 15    Comment: Performed at [facility], [address] [place], [place] [address]  CBC with Differential/Platelet     Status: Abnormal   Collection Time: 07/06/23  8:23 AM  Result Value Ref Range   WBC 11.1 (H) 4.0 - 10.5 K/uL   RBC 3.67 (L) 3.87 - 5.11 MIL/uL   Hemoglobin 10.9 (L) 12.0 - 15.0 g/dL   HCT 66.7 (L) 63.9 - 53.9 %   MCV 90.5 80.0 - 100.0 fL   MCH 29.7 26.0 - 34.0 pg   MCHC 32.8 30.0 - 36.0 g/dL   RDW 83.0 (H) 88.4 - 84.4 %   Platelets 449 (H) 150 -  400 K/uL   nRBC 1.3 (H) 0.0 - 0.2 %   Neutrophils Relative % 80 %   Neutro Abs 8.9 (H) 1.7 - 7.7 K/uL   Lymphocytes Relative 9 %   Lymphs Abs 1.0 0.7 - 4.0 K/uL   Monocytes Relative 10 %   Monocytes Absolute 1.1 (H) 0.1 - 1.0 K/uL   Eosinophils Relative 0 %   Eosinophils Absolute 0.0 0.0 - 0.5 K/uL   Basophils Relative 0 %   Basophils Absolute 0.0 0.0 - 0.1 K/uL   Immature Granulocytes 1 %   Abs Immature Granulocytes 0.08 (H) 0.00 - 0.07 K/uL    Comment: Performed at [facility], [address], [place], [place] [address]  Glucose, capillary     Status: Abnormal   Collection Time: 07/06/23 12:00 PM  Result Value Ref Range   Glucose-Capillary 127 (H) 70 - 99 mg/dL    Comment: Glucose reference range applies only to samples taken after fasting for at least 8 hours.     ROS:  Review of systems not obtained due to patient factors. Really too confused to be reliable   Physical  Exam: Vitals:   07/06/23 1000 07/06/23 1202  BP:    Pulse: 77   Resp: 16   Temp:  (!) 97.4 F (36.3 C)  SpO2: 97%      General: obese, kind, slow speaking-  easily off on tangents-  difficulty answering direct questions HEENT: some vision issues it seems   Neck: prominent chest bones Heart: RRR Lungs: poor effort, mostly clear Abdomen: obese, soft, non tender Extremities: pitting edema but also lymphedema so difficult to assess Skin: warm and dry Neuro: alert but not able to answer direct questions very clearly   Assessment/Plan: 78 year old BF- main issues seem to be obesity, lymphedema, chronic pain  and immobility and also chronic medical issues to include DM, HTN-  she now presents with crt of 4 1.Renal- I suspect that she has some CKD given her DM , HTN and kidneys that appear atrophic on imaging.  Just not aware of what her usual baseline is.  Upon admit she had UTI, hypotension and had been on ARB-  which could have caused some aki.   all of these issues now appear better-   kidney function also trended better for a time but now worsening again.  Vanc level is slightly high but not outrageous-  vanc dose per pharmacy.  Has clearer looking urine in her foley-  will resend UA.  Hemodynamically she is better-  is not dry.  Thankfully there ar no indications for dialysis.  I really am not sure she is a dialysis candidate given her obesity and immoble /bedbound state.  I did raise this with her today.   I think we will be able to do this without dialysis   I really dont have any further suggestions-  think the stage has been set for renal recovery-  will just need to hope that it comes  2. Hypertension/volume  - has edema but also lymphedema.  Dont think she is dry-  is euvoleemic to heavy-  does not need fluids or lasix 3. UTI-   will recheck urine since has been on abx 4. Anemia  - has not been that remarkable which argues a little against longstanding CKD   Curtis DELENA Heman 07/06/2023, 1:35 PM

## 2023-07-06 NOTE — Progress Notes (Signed)
 NAME:  Mary [name], MRN:  [id number], DOB:  September 03, 1945, LOS: 5 ADMISSION DATE:  07/01/2023, CONSULTATION DATE:  07/01/2023 REFERRING MD:  Darra, EDP CHIEF COMPLAINT:  shock, pneumonia  History of Present Illness:    78 year old female with PMH of former smoker (quit 2016), HTN, HFpEF, DM, immobility, chronic lymphedema, hypertensive retinopathy, glaucoma, central retinal vein occlusion, hyperlipidemia, hypothyroidism, GERD, chronic pain, and obesity who presented to the emergency department s/p fall from bed.  Presents from assisted living.  Has state DNR form.  Pt reports they were trying to get her out of bed and she fell vs slid to ground.  Pt denies hitting her head or getting dizziness but somehow ended up face down and could not breath.  Pt complains currently of right posterior hip soreness.  Also reports increasing congested productive cough over last several days after taking a cold shower but denies any SOB or fever.  Had been drinking but has no appetite.  Denies N/V/D, CP, UTI symptoms, headache, or current dizziness.  Has soreness in her abd since Saturday pt attributes to facility having a hard time moving her.    Witnesses to the event noted that while she was laying on the ground, she had a unresponsive episode versus syncope.  EMS was called and on their arrival systolics in the 60s.  She was activated as a level 1 trauma due to fall and hypotension.  Labs notable for Na 133, BUN 102, sCr 4.37, Alk phos 296, Tbili 1.8, WBC 12.1,  Hgb 11.4, I-stat lactic 3.8. CXR with cardiomegaly, and bilateral opacities. CT head and C-spine without acute abnormalities. CT CAP obtained showing multifocal pneumonia and mucous plugging of the lower lobes, possible focal hematoma along the right rectus abdominus. She was started on antibiotics with vancomycin /flagyl /cefepime  for sepsis. Given 1L IVF bolus and started on peripheral NE for ongoing hypotension and presumed septic shock. CCM was consulted  for admission.   Pertinent  Medical History  former smoker (quit 2016), HTN, HFpEF, DM, immobility, chronic lymphedema, hypertensive retinopathy, glaucoma, central retinal vein occlusion, hyperlipidemia, hypothyroidism, GERD, chronic pain, and obesity  Significant Hospital Events: Including procedures, antibiotic start and stop dates in addition to other pertinent events   6/17: admit to CCM for septic shock volume depletion, PNA and also found to have traumatic hematoma involving right rectus abdominus. Starting on broad spec abx vanc and zosyn  6/18 still on NE. POCUS showed collapsible IVC so added back IVFs. Formal ECHO pending. Getting LLE US  to r/o DVT. Working on med rec from [employer]. Started systemic steroids given elevated PSI score for CAP. Checking cortisol prior. Pressor needs remained elevated. ECHO later that afternoon. EF 35-40%) global hypokinesis, gd II diastolic dysfxn, mild red'd RV fxn w/ severely dilated RA and LA, at time of formal echo IVC w/ less than 50% resp variability  6/19 still on pressors 6/20 still on minimal pressors, arterial line placed - confirmed BP. Albumin  given. IV fluids given. 6/21 remained on vasopressors, midodrine  added, minimal urine output, CVP 10+  Interim History / Subjective:   No acute issues overnight  Off levophed  this morning   Objective   Blood pressure 112/78, pulse 74, temperature 97.8 F (36.6 C), temperature source Oral, resp. rate (!) 9, height 5' 7 (1.702 m), weight (!) 136.8 kg, SpO2 94%. CVP:  [8 mmHg-22 mmHg] 11 mmHg      Intake/Output Summary (Last 24 hours) at 07/06/2023 0803 Last data filed at 07/06/2023 0700 Gross per 24 hour  Intake  253.42 ml  Output 400 ml  Net -146.58 ml   Filed Weights   07/04/23 0500 07/05/23 0408 07/06/23 0500  Weight: 133.3 kg 134.3 kg (!) 136.8 kg   Examination:  General elderly woman, no distress, laying in bed  HENT NCAT no JVD. MMM. Pulm clear to auscultation, no wheezing Card + systolic  murmur Abd soft Ext warm, chronic Lymphedema pulses are palp chronic post knee left discomfort  Neuro awake oriented appropriate. Moves uppers at baseline. Unable to move lowers (chronically)  GU foley in place, clear yellow urine  Resolved Hospital Problem list    Assessment & Plan:  Septic shock w/ lactic acidosis 2/2 multifocal pneumonia (parainfluenza virus) +/- CAP and suspected UTI (purulent urine in foley); further complicated by newly identified systolic and diastolic CM ? Viral CM?  (EF 35-40%) global hypokinesis, gd II diastolic dysfxn, mild red'd RV fxn w/ severely dilated RA and LA, at time of formal echo IVC w/ less than 50% resp variability  Still on norepi MRSA PCR + Cultures still pending  Plan Day 5 vanc and zosyn , plan for 7 day course of each F/u pending cultures  Procal trending down Cont to titrate peripheral NE  for SBP goal > 65  Continue midodrine  10mg  TID today  Syncope/Unresponsiveness w/ elevated trop I  Presume this is related to her hypotension from septic shock vs ?orthostatic given poor PO intake. However, given age, cardiomegaly will rule out other causes. CT head negative.  No known anticoagulates, normal INR. - currently neurologically at baseline, cont to trend neuro checks plan Cont tele   Diastolic and Systolic Heart failure, unknown if acute vs chronic Plan Holding goal directed medical therapy while on vasopressors Coox does not indicate cardiogenic shock  Acute hypoxic respiratory failure 2/2 multifocal pneumonia, parainfluenza positive Multiple lung nodules, ?spiculated in RLL Plan Cont pulse ox Wean o2 for sats >92% Abx as above Day 5 of 7 pred for CAP given PSI > 130  duonebs q4prn Cont pulm hygiene with IS, flutter, guaifenesin   will need repeat CT in 3 months to ensure clearing of nodules, is higher risk given prior smoking hx  Acute/Chronic Renal failure - BUN/cr improved w/ resuscitation effort, UNa 29 w/ FENA 0.9 still  suggesting pre-renal status. The renal US  showed several small bilateral cysts but no hydro. Was still volume depleted as of 6/18 am, but responded to IVFs. Given more volume 6/20 Plan F/u chem Strict I&O Renal adjust med KVO IVFs Consult nephrology today  Fluid and electrolyte imbalance: hyponatremia, hypocalcemia  Plan Awaiting am labs. Replace as indicated  Abnormal alk PO4.  T-bili marginally elevated. Ast/alt nml. CT although poor contrast did not GB distention. She is bedbound essentially suspect this is related Plan Continue to monitor  Rectus abdominus hematoma, POA  - watch clinically, reports pain since Saturday after she was moved.  Hgb stable, started [place] heparin  6/18 Plan Trend CBC  DMT2 Awaiting am check thus far at goal  Plan Ssi  Goal 140-180   HTN HLD Plan Resume home meds when approp   Hypothyroidism Plan synthroid    Chronic debility, Obesity- BMI 57.95, Lymphedema  and Chronic pain  - non wt bearing at baseline  Plan Holding her home diuretics Left leg US  negative for DVT  GOC - pt wishes to remain DNR/ DNI, ok with pressors for now.     Best Practice (right click and Reselect all SmartList Selections daily)   Diet/type: Regular consistency (see orders) DVT prophylaxis: prophylactic heparin   Pressure ulcer(s). Clinically undetermined if the pressure injury was present on admission GI prophylaxis: N/A Lines: N/A Foley:  Yes, and it is still needed Code Status:  DNR Last date of multidisciplinary goals of care discussion [07/01/2023]  Daughter and son at bedside, updated on plan of care  Critical care time: 70 minutes    Dorn Chill, MD [facility] Office: [phone]   See Amion for personal pager PCCM on call pager [phone] until 7pm. Please call Elink 7p-7a. [phone]

## 2023-07-06 NOTE — Plan of Care (Signed)
   Problem: Coping: Goal: Ability to adjust to condition or change in health will improve Outcome: Progressing   Problem: Fluid Volume: Goal: Ability to maintain a balanced intake and output will improve Outcome: Progressing

## 2023-07-07 ENCOUNTER — Inpatient Hospital Stay (HOSPITAL_COMMUNITY): Payer: Medicare (Managed Care)

## 2023-07-07 DIAGNOSIS — A419 Sepsis, unspecified organism: Secondary | ICD-10-CM | POA: Diagnosis not present

## 2023-07-07 DIAGNOSIS — R6521 Severe sepsis with septic shock: Secondary | ICD-10-CM

## 2023-07-07 LAB — RENAL FUNCTION PANEL
Albumin: 2.8 g/dL — ABNORMAL LOW (ref 3.5–5.0)
Anion gap: 19 — ABNORMAL HIGH (ref 5–15)
BUN: 101 mg/dL — ABNORMAL HIGH (ref 8–23)
CO2: 18 mmol/L — ABNORMAL LOW (ref 22–32)
Calcium: 9.2 mg/dL (ref 8.9–10.3)
Chloride: 95 mmol/L — ABNORMAL LOW (ref 98–111)
Creatinine, Ser: 4.26 mg/dL — ABNORMAL HIGH (ref 0.44–1.00)
GFR, Estimated: 10 mL/min — ABNORMAL LOW (ref >60)
Glucose, Bld: 109 mg/dL — ABNORMAL HIGH (ref 70–99)
Phosphorus: 6.3 mg/dL — ABNORMAL HIGH (ref 2.5–4.6)
Potassium: 5.3 mmol/L — ABNORMAL HIGH (ref 3.5–5.1)
Sodium: 132 mmol/L — ABNORMAL LOW (ref 135–145)

## 2023-07-07 LAB — CBC WITH DIFFERENTIAL/PLATELET
Abs Immature Granulocytes: 0.1 10*3/uL — ABNORMAL HIGH (ref 0.00–0.07)
Basophils Absolute: 0 10*3/uL (ref 0.0–0.1)
Basophils Relative: 0 %
Eosinophils Absolute: 0 10*3/uL (ref 0.0–0.5)
Eosinophils Relative: 0 %
HCT: 36.7 % (ref 36.0–46.0)
Hemoglobin: 12 g/dL (ref 12.0–15.0)
Immature Granulocytes: 1 %
Lymphocytes Relative: 12 %
Lymphs Abs: 1.5 10*3/uL (ref 0.7–4.0)
MCH: 29.6 pg (ref 26.0–34.0)
MCHC: 32.7 g/dL (ref 30.0–36.0)
MCV: 90.6 fL (ref 80.0–100.0)
Monocytes Absolute: 1.2 10*3/uL — ABNORMAL HIGH (ref 0.1–1.0)
Monocytes Relative: 10 %
Neutro Abs: 8.9 10*3/uL — ABNORMAL HIGH (ref 1.7–7.7)
Neutrophils Relative %: 77 %
Platelets: 441 10*3/uL — ABNORMAL HIGH (ref 150–400)
RBC: 4.05 MIL/uL (ref 3.87–5.11)
RDW: 17.2 % — ABNORMAL HIGH (ref 11.5–15.5)
WBC: 11.7 10*3/uL — ABNORMAL HIGH (ref 4.0–10.5)
nRBC: 1.6 % — ABNORMAL HIGH (ref 0.0–0.2)

## 2023-07-07 LAB — GLUCOSE, CAPILLARY
Glucose-Capillary: 106 mg/dL — ABNORMAL HIGH (ref 70–99)
Glucose-Capillary: 117 mg/dL — ABNORMAL HIGH (ref 70–99)
Glucose-Capillary: 120 mg/dL — ABNORMAL HIGH (ref 70–99)
Glucose-Capillary: 141 mg/dL — ABNORMAL HIGH (ref 70–99)

## 2023-07-07 LAB — CORTISOL: Cortisol, Plasma: 10.6 ug/dL

## 2023-07-07 LAB — VANCOMYCIN, RANDOM: Vancomycin Rm: 32 ug/mL

## 2023-07-07 LAB — LACTIC ACID, PLASMA
Lactic Acid, Venous: 1.7 mmol/L (ref 0.5–1.9)
Lactic Acid, Venous: 1.7 mmol/L (ref 0.5–1.9)

## 2023-07-07 LAB — AMMONIA: Ammonia: 48 umol/L — ABNORMAL HIGH (ref 9–35)

## 2023-07-07 MED ORDER — NALOXONE HCL 0.4 MG/ML IJ SOLN: 0.4000 mg | INTRAMUSCULAR | Status: DC | PRN

## 2023-07-07 MED ORDER — MIDODRINE HCL 5 MG PO TABS
15.0000 mg | ORAL_TABLET | Freq: Three times a day (TID) | ORAL | Status: DC
  Administered 2023-07-07 – 2023-07-11 (×10): 15 mg via ORAL
  Filled 2023-07-07 (×12): qty 3

## 2023-07-07 MED ORDER — SODIUM CHLORIDE 0.9 % IV BOLUS
250.0000 mL | Freq: Once | INTRAVENOUS | Status: AC
  Administered 2023-07-07: 250 mL via INTRAVENOUS

## 2023-07-07 MED ORDER — SODIUM ZIRCONIUM CYCLOSILICATE 10 G PO PACK
10.0000 g | PACK | Freq: Every day | ORAL | Status: DC
  Administered 2023-07-08 – 2023-07-11 (×4): 10 g via ORAL
  Filled 2023-07-07 (×4): qty 1

## 2023-07-07 MED ORDER — SODIUM ZIRCONIUM CYCLOSILICATE 5 G PO PACK
5.0000 g | PACK | Freq: Every day | ORAL | Status: DC
  Administered 2023-07-07: 5 g via ORAL
  Filled 2023-07-07: qty 1

## 2023-07-07 NOTE — Progress Notes (Signed)
 [facility] ROUNDING NOTE Mary [name] FMW:[id number]  DOB: December 20, 1945  DOA: 07/01/2023  PCP: Patient, No Pcp Per  07/07/2023,7:29 AM  LOS: 6 days    Code Status: DNR limited   from: Home current Dispo: Unclear  unmerged chart-MRN: [id number]   78 year old female Known history DM TY 2 HTN diabetes plus diabetic retinopathy Quit smoking 2016 CRVO with Avastin injections Dr. Valdemar HLD hypothyroid Previous knee replacement-relatively immobile HFpEF with echocardiogram 12/20/2021 50-55% global hypokinesis grade 2 DD and low normal function Substance abuse Super morbid obesity BMI 49-50  6/17 admitted by critical care falling out of bed could not breathe-witnesses reported unresponsive versus syncopal episode-level 1 trauma Systolic BP 60-BUN/creatinine 897/5.6 WBC 12 lactic acid 3.8 CXR cardiomegaly opacity CT head spine no acute abnormality CT chest multifocal pneumonia mucous plugging hematoma right rectus abdominis Admitted to ICU because of persistent hypotension presumed septic shock 6/18 still on NE. POCUS showed collapsible IVC so added back IVFs=. ECHO -EF 35-40% global hypokinesis, gd II diastolic dysfxn, mild red'd RV fxn w/ severely dilated RA and LA, at time of formal echo IVC w/ less than 50% resp variability  6/19 still on pressors 6/20 still on minimal pressors, arterial line placed - confirmed BP. Albumin  given. IV fluids given. 6/21 remained on vasopressors, midodrine  added, minimal urine output, CVP 10+ 6/22 Levophed  stopped 6/23 transferred out to [facility]   Plan  Septic shock secondary to?  Multifocal pneumonia with parainfluenza Suspected UTI Vancomycin  completed 6/22 continue Zosyn  every 8, culture data blood cultures 6/17 negative Urine culture shows lactobacillus which would be adequately treated Cortisol a.m. was 24, given stress dose steroids currently on prednisone  40, EOT 6/26 Get chest x-ray  as last 1 showed edema versus infection Watch closely as risk for  worsening with underlying parainfluenza  Syncope unresponsiveness with elevated troponin Previous HFpEF Co. ox not indicating cardiogenic shock--- suggestive septic shock Increase midodrine  to 15 3 times daily now still quite hypotensive with low flow--- stop oral opiates Echocardiogram as above  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 probably for follow-up  Acute superimposed on underlying renal failure presumed-unclear-previously on losartan 100, torsemide 40 Hyperkalemia Creatinine continues to rise with now some mild hyperkalemia and anion gap Note received vancomycin  until 6/22 UOP +3.4 L weight is up from 128 to 138 kg--Defer to nephrology further management Given dose of Lokelma 5 mg today  Rectus abdominis hematoma As still hypotensive with scan abdomen/pelvis again to rule out bleeding internally- although less likely as hemoglobin improved treatment would be supportive but may require agent such as Kcentra.  Previous COPD On guaifenesin  600 every 12 DuoNeb 3 mL every 4 as needed and continues steroid  DM TY 2 CBG widely variable 117 and 210 Continue 3 times daily insulin  for now and adjust as needed  BMI >50 Extremely high risk with elevated BMI for decompensation  Hypothyroid Continue Synthroid  125 daily  Chronic debility Weak and will need probable skilled  DVT prophylaxis: Heparin   Status is: Inpatient Remains inpatient appropriate because:   Remains ill   Subjective: Awake coherent X1 no distress cannot tell me place but gets it corrected after several tries seems a little sleepy was more oriented per nurse earlier Seems to be eating and taking pills No chest pain  Objective + exam Vitals:   07/06/23 1900 07/06/23 1944 07/06/23 2300 07/07/23 0300  BP: 123/86 131/86 118/85 111/82  Pulse: 68 68 71 68  Resp: 16 (!) 22 10 17   Temp:  97.7 F (36.5  C) (!) 97.5 F (36.4 C) (!) 97.5 F (36.4 C)  TempSrc:  Oral Axillary Axillary  SpO2: 94% 96% 100%  96%  Weight:    (!) 138.2 kg  Height:       Filed Weights   07/05/23 0408 07/06/23 0500 07/07/23 0300  Weight: 134.3 kg (!) 136.8 kg (!) 138.2 kg    Examination: EOMI NCAT remove central line dressing on left neck S1-S2 no murmur Slightly firm abdomen in upper quadrants very difficult exam given obesity Foley catheter in place No breakdown on the lower heels SCDs in place with plexi pumps Power 5/5 but limited No flap  Data Reviewed: reviewed   CBC    Component Value Date/Time   WBC 11.1 (H) 07/06/2023 0823   RBC 3.67 (L) 07/06/2023 0823   HGB 10.9 (L) 07/06/2023 0823   HCT 33.2 (L) 07/06/2023 0823   PLT 449 (H) 07/06/2023 0823   MCV 90.5 07/06/2023 0823   MCH 29.7 07/06/2023 0823   MCHC 32.8 07/06/2023 0823   RDW 16.9 (H) 07/06/2023 0823   LYMPHSABS 1.0 07/06/2023 0823   MONOABS 1.1 (H) 07/06/2023 0823   EOSABS 0.0 07/06/2023 0823   BASOSABS 0.0 07/06/2023 0823      Latest Ref Rng & Units 07/06/2023    8:23 AM 07/05/2023    8:43 AM 07/04/2023    5:54 AM  CMP  Glucose 70 - 99 mg/dL 862  836  815   BUN 8 - 23 mg/dL 95  92  93   Creatinine 0.44 - 1.00 mg/dL 5.96  6.06  6.34   Sodium 135 - 145 mmol/L 133  132  132   Potassium 3.5 - 5.1 mmol/L 4.7  4.6  4.5   Chloride 98 - 111 mmol/L 96  97  98   CO2 22 - 32 mmol/L 21  19  20    Calcium  8.9 - 10.3 mg/dL 9.0  8.9  8.5   Total Protein 6.5 - 8.1 g/dL  6.2  5.7   Total Bilirubin 0.0 - 1.2 mg/dL  1.5  1.3   Alkaline Phos 38 - 126 U/L  136  168   AST 15 - 41 U/L  14  14   ALT 0 - 44 U/L  15  19     Scheduled Meds:  ascorbic acid   500 mg Oral Daily   brimonidine   1 drop Both Eyes TID   Chlorhexidine  Gluconate Cloth  6 each Topical Daily   feeding supplement  237 mL Oral BID BM   guaiFENesin   600 mg Oral Q12H   heparin  injection (subcutaneous)  5,000 Units Subcutaneous Q8H   insulin  aspart  0-9 Units Subcutaneous TID WC   levothyroxine   125 mcg Oral Q0600   midodrine   10 mg Oral TID WC   multivitamin with minerals   1 tablet Oral Daily   mouth rinse  15 mL Mouth Rinse 4 times per day   polyethylene glycol  17 g Oral BID   pravastatin   40 mg Oral q1800   predniSONE   40 mg Oral Q breakfast   timolol   1 drop Right Eye BID   zinc  sulfate (50mg  elemental zinc )  220 mg Oral Daily   Continuous Infusions:  norepinephrine  (LEVOPHED ) Adult infusion Stopped (07/06/23 0747)   piperacillin -tazobactam (ZOSYN )  IV 2.25 g (07/07/23 0627)    Time 60  Colen Grimes, MD  [facility]

## 2023-07-07 NOTE — Progress Notes (Signed)
 Heart Failure Navigator Progress Note  Assessed for Heart & Vascular TOC clinic readiness.  Patient does not meet criteria due to per MD patient is bed bound, No HF TOC. .   Navigator will sign off at this time.   Stephane Haddock, BSN, Scientist, clinical (histocompatibility and immunogenetics) Only

## 2023-07-07 NOTE — Evaluation (Addendum)
 Physical Therapy Evaluation Patient Details Name: Mary [name] MRN: [id number] DOB: Apr 09, 1945 Today's Date: 07/07/2023  History of Present Illness  The pt is a 78 yo female presenting 6/17 after a ground-level fall followed by syncopal episode while in supine. Admitted for management of septic shock, hospital course complicated by continued need for pressor support. PMH includes: HTN, HFpEF, DM II, immobility, lymphedema, HTN retinopathy, HLD, chronic pain, and obesity (BMI 47.7).   Clinical Impression  Pt in bed upon arrival of PT, agreeable to evaluation at this time. The pt was inconsistent with answering questions during my evaluation and no family present, per chart, pt from SNF where she has had limited mobility. Pt reports use of lift with facility staff but was unable to state frequency of OOB mobility. Pt also did not answer questions regarding ADL management, suspect assist from staff. Pt with marked edema in BLE (hx of lymphedema) and more pain in LLE with ROM than RLE. Pt was able to assist with knee flexion and extension in RLE, did not demo active ROM or activation in LLE, but could be due to lack of effort. Pt intermittently answering questions briskly, then repeating initial answer for all further questions or not answering altogether. Full OOB assessment limited by pt participation and arrival of transport to take pt to CT. Will continue efforts acutely but recommend return to facility with continued skilled PT to progress strength, ROM, and upright tolerance to facilitate maximal independence and decreased caregiver burden.      If plan is discharge home, recommend the following: Two people to help with walking and/or transfers;Two people to help with bathing/dressing/bathroom;Assistance with cooking/housework;Assistance with feeding;Direct supervision/assist for medications management;Direct supervision/assist for financial management;Assist for transportation;Help with stairs or  ramp for entrance;Supervision due to cognitive status   Can travel by private vehicle   No    Equipment Recommendations Wheelchair (measurements PT);Wheelchair cushion (measurements PT);Hospital bed;Hoyer lift (bariatric)  Recommendations for Other Services  OT consult    Functional Status Assessment Patient has had a recent decline in their functional status and demonstrates the ability to make significant improvements in function in a reasonable and predictable amount of time.     Precautions / Restrictions Precautions Precautions: Fall Recall of Precautions/Restrictions: Impaired Precaution/Restrictions Comments: lymphedema, soft BP Restrictions Weight Bearing Restrictions Per Provider Order: No      Mobility  Bed Mobility Overal bed mobility: Needs Assistance Bed Mobility: Rolling Rolling: Total assist         General bed mobility comments: likely totalA of 2-3, pt aggreable to partial roll but unable to complete and then pt c/o pain, totalA to reposition LE in bed    Transfers                   General transfer comment: would be dependent on lift          Pertinent Vitals/Pain Pain Assessment Pain Assessment: Faces Faces Pain Scale: Hurts little more Pain Location: bilateral knees with ROM, L more than RLE Pain Descriptors / Indicators: Discomfort, Grimacing Pain Intervention(s): Limited activity within patient's tolerance, Monitored during session, Repositioned    Home Living Family/patient expects to be discharged to:: Skilled nursing facility                        Prior Function Prior Level of Function : Needs assist;History of Falls (last six months)             Mobility Comments: pt  unreliable historian, reports lifted out of bed by staff but unable to state frequency this occured and this does not match with admission reason of pt falling while being assisted back to bed by staff. pt not answering questions consistently, no  family present ADLs Comments: pt did not attempt to answer questions regarding ADLs, suspect assist from staff     Extremity/Trunk Assessment   Upper Extremity Assessment Upper Extremity Assessment: Generalized weakness (pt with more pain with ROM of R shoulder, grossly 4-/5 to MMT when pt following commands)    Lower Extremity Assessment Lower Extremity Assessment: RLE deficits/detail;LLE deficits/detail RLE Deficits / Details: pt with significant pitting edema, better ROM at knee and ankle in RLE compared to LLE. able to activate for knee flexion and extension to cues and complete ankle ROM. did notanswer regarding sensation. no attempt to complete ROM at hip LLE Deficits / Details: pt with significant pitting edema, better ROM at knee and ankle in RLE compared to LLE. did not answer regarding sensation. no attempt to complete ROM at knee or hip    Cervical / Trunk Assessment Cervical / Trunk Assessment: Other exceptions Cervical / Trunk Exceptions: large body habitus  Communication   Communication Communication: Impaired Factors Affecting Communication: Difficulty expressing self    Cognition Arousal: Lethargic Behavior During Therapy: Flat affect   PT - Cognitive impairments: No family/caregiver present to determine baseline, Attention, Awareness, Memory, Difficult to assess Difficult to assess due to: Level of arousal                     PT - Cognition Comments: no family present, pt with inconsistent attempt to answer questions, able to answer name briskly when asked, then continued to state her name when asked her birthday. pt able to state [place] and eventually hospital, but required freqquent cues with various wrong answers and repetition of [facility] before stating hospital. Pt attempting to follow commands with RLE and BUE, no attempt to complete ROM and MMT with LLE, progressively less responsiveness with fatigue Following commands: Impaired Following  commands impaired: Follows one step commands inconsistently, Follows one step commands with increased time     Cueing Cueing Techniques: Verbal cues, Gestural cues, Tactile cues, Visual cues     General Comments General comments (skin integrity, edema, etc.): VSS    Exercises General Exercises - Lower Extremity Ankle Circles/Pumps: AAROM, Both, 10 reps Quad Sets: AROM, Right, 5 reps Heel Slides: PROM, Both, 10 reps Hip ABduction/ADduction: PROM, Both, 5 reps   Assessment/Plan    PT Assessment Patient needs continued PT services  PT Problem List Decreased strength;Decreased range of motion;Decreased activity tolerance;Decreased balance;Decreased mobility;Decreased cognition;Decreased safety awareness;Obesity;Pain       PT Treatment Interventions Gait training;DME instruction;Stair training;Functional mobility training;Therapeutic activities;Therapeutic exercise;Balance training;Patient/family education    PT Goals (Current goals can be found in the Care Plan section)  Acute Rehab PT Goals Patient Stated Goal: none stated PT Goal Formulation: Patient unable to participate in goal setting Time For Goal Achievement: 07/21/23 Potential to Achieve Goals: Fair    Frequency Min 1X/week        AM-PAC PT 6 Clicks Mobility  Outcome Measure Help needed turning from your back to your side while in a flat bed without using bedrails?: Total Help needed moving from lying on your back to sitting on the side of a flat bed without using bedrails?: Total Help needed moving to and from a bed to a chair (including a wheelchair)?: Total Help needed  standing up from a chair using your arms (e.g., wheelchair or bedside chair)?: Total Help needed to walk in hospital room?: Total Help needed climbing 3-5 steps with a railing? : Total 6 Click Score: 6    End of Session   Activity Tolerance: Patient limited by pain Patient left: in bed (with transport to go to CT) Nurse Communication: Mobility  status;Need for lift equipment PT Visit Diagnosis: Unsteadiness on feet (R26.81);Other abnormalities of gait and mobility (R26.89);Muscle weakness (generalized) (M62.81);History of falling (Z91.81);Pain Pain - Right/Left:  (bilateral) Pain - part of body: Leg    Time: 8544-8491 PT Time Calculation (min) (ACUTE ONLY): 13 min   Charges:   PT Evaluation $PT Eval Moderate Complexity: 1 Mod   PT General Charges $$ ACUTE PT VISIT: 1 Visit         Izetta Call, PT, DPT   Acute Rehabilitation Department Office [phone] Secure Chat Communication Preferred  Izetta JULIANNA Call 07/07/2023, 4:22 PM

## 2023-07-07 NOTE — IPAL (Signed)
 Patient reviewed after IV fluid bolus oral midodrine  and narcan She is a little brighter more awake She has no pain  I got her daughter Arbutus on phone and updated  I confirmed to [facility] that patient isnt a dialysis candidate  for multip,e reasons--she understands- I discussed possible inmprovement which would be ideal, but if none, we might have to pursue pallaitve consult in 24-48.  ADv Care planning >15 min  She understands and agrees  Reggie Grimes, MD [facility] 6:23 PM   .

## 2023-07-07 NOTE — Progress Notes (Addendum)
 Pharmacist Heart Failure Core Measure Documentation  Assessment: Mary [name] has an EF documented as 35-40% on 07/02/23 by ECHO.  Rationale: Heart failure patients with left ventricular systolic dysfunction (LVSD) and an EF < 40% should be prescribed an angiotensin converting enzyme inhibitor (ACEI) or angiotensin receptor blocker (ARB) at discharge unless a contraindication is documented in the medical record.  This patient is not currently on an ACEI or ARB for HF.  This note is being placed in the record in order to provide documentation that a contraindication to the use of these agents is present for this encounter.  ACE Inhibitor or Angiotensin Receptor Blocker is contraindicated (specify all that apply)  []   ACEI allergy AND ARB allergy []   Angioedema []   Moderate or severe aortic stenosis []   Hyperkalemia []   Hypotension []   Renal artery stenosis [x]   Worsening renal function, preexisting renal disease or dysfunction Rectus abdominis hematoma and soft vitals.  PTA losartan on hold.  Alton Tremblay D. Lendell, PharmD, BCPS, BCCCP 07/07/2023, 1:16 PM

## 2023-07-07 NOTE — Progress Notes (Signed)
 Patient ID: Mary [name], female   DOB: December 31, 1945, 78 y.o.   MRN: [id number] S: Pt is lethargic today.  Denies any N/V/D, SOB or malaise.  Just feels tired. O:BP 109/66 (BP Location: Right Wrist)   Pulse 74   Temp 97.8 F (36.6 C) (Oral)   Resp 15   Ht 5' 7 (1.702 m)   Wt (!) 138.2 kg   SpO2 91%   BMI 47.72 kg/m   Intake/Output Summary (Last 24 hours) at 07/07/2023 1341 Last data filed at 07/07/2023 0900 Gross per 24 hour  Intake 222.94 ml  Output 550 ml  Net -327.06 ml   Intake/Output: I/O last 3 completed shifts: In: 496.6 [P.O.:290; I.V.:56.6; IV Piggyback:150] Out: 850 [Urine:850]  Intake/Output this shift:  No intake/output data recorded. Weight change: 1.4 kg Gen: NAD CVS: RRR Resp:CTA Abd: +_BS, obese, NT Ext: chronic lymphedema of bilateral LE's.  Recent Labs  Lab 07/01/23 1111 07/01/23 1121 07/02/23 0518 07/03/23 1243 07/03/23 1254 07/04/23 0554 07/05/23 0843 07/06/23 0823 07/07/23 0931  NA 133* 133* 131* 131*  --  132* 132* 133* 132*  K 4.9 4.8 4.4 4.5  --  4.5 4.6 4.7 5.3*  CL 96* 97* 99 97*  --  98 97* 96* 95*  CO2 22  --  22 22  --  20* 19* 21* 18*  GLUCOSE 134* 127* 132* 177*  --  184* 163* 137* 109*  BUN 102* 98* 94* 91*  --  93* 92* 95* 101*  CREATININE 4.37* 4.20* 3.82* 3.40*  --  3.65* 3.93* 4.03* 4.26*  ALBUMIN  2.5*  --  2.4* 2.1* 2.1* 2.0* 3.0*  --  2.8*  CALCIUM  8.6*  --  8.2* 8.2*  --  8.5* 8.9 9.0 9.2  PHOS  --   --  5.4*  --   --   --   --   --  6.3*  AST 35  --  26 16 16  14* 14*  --   --   ALT 31  --  29 22 20 19 15   --   --    Liver Function Tests: Recent Labs  Lab 07/03/23 1254 07/04/23 0554 07/05/23 0843 07/07/23 0931  AST 16 14* 14*  --   ALT 20 19 15   --   ALKPHOS 210* 168* 136*  --   BILITOT 2.1* 1.3* 1.5*  --   PROT 6.0* 5.7* 6.2*  --   ALBUMIN  2.1* 2.0* 3.0* 2.8*   No results for input(s): LIPASE, AMYLASE in the last 168 hours. No results for input(s): AMMONIA in the last 168 hours. CBC: Recent Labs   Lab 07/03/23 0509 07/04/23 0554 07/05/23 0843 07/06/23 0823 07/07/23 0931  WBC 12.1* 10.4 11.0* 11.1* 11.7*  NEUTROABS  --   --  8.5* 8.9* 8.9*  HGB 12.1 11.2* 10.6* 10.9* 12.0  HCT 36.3 34.2* 32.6* 33.2* 36.7  MCV 89.4 90.0 90.1 90.5 90.6  PLT 370 441* 404* 449* 441*   Cardiac Enzymes: No results for input(s): CKTOTAL, CKMB, CKMBINDEX, TROPONINI in the last 168 hours. CBG: Recent Labs  Lab 07/06/23 0801 07/06/23 1200 07/06/23 1615 07/07/23 0800 07/07/23 1213  GLUCAP 124* 127* 142* 106* 117*    Iron Studies: No results for input(s): IRON, TIBC, TRANSFERRIN, FERRITIN in the last 72 hours. Studies/Results: No results found.  ascorbic acid   500 mg Oral Daily   brimonidine   1 drop Both Eyes TID   Chlorhexidine  Gluconate Cloth  6 each Topical Daily   feeding supplement  237  mL Oral BID BM   guaiFENesin   600 mg Oral Q12H   heparin  injection (subcutaneous)  5,000 Units Subcutaneous Q8H   insulin  aspart  0-9 Units Subcutaneous TID WC   levothyroxine   125 mcg Oral Q0600   midodrine   15 mg Oral TID WC   multivitamin with minerals  1 tablet Oral Daily   mouth rinse  15 mL Mouth Rinse 4 times per day   polyethylene glycol  17 g Oral BID   pravastatin   40 mg Oral q1800   predniSONE   40 mg Oral Q breakfast   sodium zirconium cyclosilicate  5 g Oral Daily   timolol   1 drop Right Eye BID   zinc  sulfate (50mg  elemental zinc )  220 mg Oral Daily    BMET    Component Value Date/Time   NA 132 (L) 07/07/2023 0931   K 5.3 (H) 07/07/2023 0931   CL 95 (L) 07/07/2023 0931   CO2 18 (L) 07/07/2023 0931   GLUCOSE 109 (H) 07/07/2023 0931   BUN 101 (H) 07/07/2023 0931   CREATININE 4.26 (H) 07/07/2023 0931   CALCIUM  9.2 07/07/2023 0931   GFRNONAA 10 (L) 07/07/2023 0931   CBC    Component Value Date/Time   WBC 11.7 (H) 07/07/2023 0931   RBC 4.05 07/07/2023 0931   HGB 12.0 07/07/2023 0931   HCT 36.7 07/07/2023 0931   PLT 441 (H) 07/07/2023 0931   MCV 90.6  07/07/2023 0931   MCH 29.6 07/07/2023 0931   MCHC 32.7 07/07/2023 0931   RDW 17.2 (H) 07/07/2023 0931   LYMPHSABS 1.5 07/07/2023 0931   MONOABS 1.2 (H) 07/07/2023 0931   EOSABS 0.0 07/07/2023 0931   BASOSABS 0.0 07/07/2023 0931    Assessment/Plan: 78 year old BF- main issues seem to be obesity, lymphedema, chronic pain  and immobility and also chronic medical issues to include DM, HTN-  she now presents with crt of 4 1.Renal- Unclear baseline but likely has underlying CKD given her DM , HTN and kidneys that appear atrophic on imaging.  Presumably ischemic ATN in the setting of UTI, hypotension and concomitant ARB therapy.  Losartan has been held and she is on broad spectrum antibiotics.  She was given pressors and some fluid initially but off IVF's and levophed .  Her blood pressures are soft this morning.  Kidney function initially improved but now worsening again.  Vanc level is slightly high but not outrageous-  vanc dose per pharmacy.  Hemodynamically she is worsening.  K up to 5.3, Co2 down to 18, and BUN/Cr up to 101/4.26.  UOP slightly better over the last 48 hours.  Will bolus some IVF's to raise blood pressure but she may need to transfer back to ICU for pressors if her BP continues to drop.  No emergent indications for dialysis at this time, but she is not a candidate for outpatient dialysis given her bed bound status and morbid obesity.    - Avoid nephrotoxic medications including NSAIDs and iodinated intravenous contrast exposure unless the latter is absolutely indicated.   - Preferred narcotic agents for pain control are hydromorphone, fentanyl, and methadone. Morphine should not be used.  - Avoid Baclofen and avoid oral sodium phosphate and magnesium citrate based laxatives / bowel preps.  - Continue strict Input and Output monitoring. Will monitor the patient closely with you and intervene or adjust therapy as indicated by changes in clinical status/labs   2. Hypertension/volume  - has  chronic lymphedema.  Blood pressures are dropping despite  15 mg midodrine  tid so would not use diuretic.  IVF's as above.  May need transfer back to ICU for pressors. 3. UTI-   will recheck urine since has been on abx but grew lactobacillus.   4. Anemia  - has not been that remarkable which argues a little against longstanding CKD 5. Hyperkalemia - will start Lokelma and follow. 6. Rectus abdominis hematoma -  Hgb up to 12 today.  Continue to follow.  7. Parainfluenza 3 +  - droplet precaution, plan per primary.  8. Disposition - pt with super morbid obesity and is bed bound.  Not a candidate for long term dialysis.  Recommend palliative care to help set goals/limits of care.  Fairy RONAL Sellar, MD [facility]

## 2023-07-08 ENCOUNTER — Inpatient Hospital Stay (HOSPITAL_COMMUNITY): Payer: Medicare (Managed Care)

## 2023-07-08 DIAGNOSIS — A419 Sepsis, unspecified organism: Secondary | ICD-10-CM | POA: Diagnosis not present

## 2023-07-08 DIAGNOSIS — J189 Pneumonia, unspecified organism: Secondary | ICD-10-CM | POA: Diagnosis not present

## 2023-07-08 DIAGNOSIS — R579 Shock, unspecified: Secondary | ICD-10-CM

## 2023-07-08 DIAGNOSIS — Z7189 Other specified counseling: Secondary | ICD-10-CM

## 2023-07-08 DIAGNOSIS — N179 Acute kidney failure, unspecified: Secondary | ICD-10-CM | POA: Diagnosis not present

## 2023-07-08 DIAGNOSIS — R6521 Severe sepsis with septic shock: Secondary | ICD-10-CM | POA: Diagnosis not present

## 2023-07-08 LAB — CBC
HCT: 37.1 % (ref 36.0–46.0)
Hemoglobin: 12 g/dL (ref 12.0–15.0)
MCH: 30.2 pg (ref 26.0–34.0)
MCHC: 32.3 g/dL (ref 30.0–36.0)
MCV: 93.2 fL (ref 80.0–100.0)
Platelets: 456 10*3/uL — ABNORMAL HIGH (ref 150–400)
RBC: 3.98 MIL/uL (ref 3.87–5.11)
RDW: 17.6 % — ABNORMAL HIGH (ref 11.5–15.5)
WBC: 11.6 10*3/uL — ABNORMAL HIGH (ref 4.0–10.5)
nRBC: 3.8 % — ABNORMAL HIGH (ref 0.0–0.2)

## 2023-07-08 LAB — RENAL FUNCTION PANEL
Albumin: 2.6 g/dL — ABNORMAL LOW (ref 3.5–5.0)
Anion gap: 16 — ABNORMAL HIGH (ref 5–15)
BUN: 108 mg/dL — ABNORMAL HIGH (ref 8–23)
CO2: 19 mmol/L — ABNORMAL LOW (ref 22–32)
Calcium: 9 mg/dL (ref 8.9–10.3)
Chloride: 97 mmol/L — ABNORMAL LOW (ref 98–111)
Creatinine, Ser: 4.42 mg/dL — ABNORMAL HIGH (ref 0.44–1.00)
GFR, Estimated: 10 mL/min — ABNORMAL LOW (ref >60)
Glucose, Bld: 109 mg/dL — ABNORMAL HIGH (ref 70–99)
Phosphorus: 7 mg/dL — ABNORMAL HIGH (ref 2.5–4.6)
Potassium: 5.3 mmol/L — ABNORMAL HIGH (ref 3.5–5.1)
Sodium: 132 mmol/L — ABNORMAL LOW (ref 135–145)

## 2023-07-08 LAB — GLUCOSE, CAPILLARY
Glucose-Capillary: 126 mg/dL — ABNORMAL HIGH (ref 70–99)
Glucose-Capillary: 116 mg/dL — ABNORMAL HIGH (ref 70–99)
Glucose-Capillary: 110 mg/dL — ABNORMAL HIGH (ref 70–99)
Glucose-Capillary: 95 mg/dL (ref 70–99)

## 2023-07-08 MED ORDER — HYDROMORPHONE HCL 1 MG/ML IJ SOLN
0.2500 mg | INTRAMUSCULAR | Status: DC | PRN
  Administered 2023-07-11 (×2): 0.25 mg via INTRAVENOUS
  Filled 2023-07-08 (×2): qty 0.5

## 2023-07-08 NOTE — Progress Notes (Signed)
 TR[facility] ROUNDING NOTE Mary [name] FMW:[id number]  DOB: 12-05-1945  DOA: 07/01/2023  PCP: Patient, No Pcp Per  07/08/2023,10:05 AM  LOS: 7 days    Code Status: DNR limited   from: Home current Dispo: Unclear  unmerged chart-MRN: [id number]   78 year old female Known history DM TY 2 HTN diabetes plus diabetic retinopathy Quit smoking 2016 CRVO with Avastin injections Dr. Valdemar HLD hypothyroid Previous knee replacement-relatively immobile HFpEF with echocardiogram 12/20/2021 50-55% global hypokinesis grade 2 DD and low normal function Substance abuse Super morbid obesity BMI 49-50  6/17 admitted by critical care falling out of bed could not breathe-witnesses reported unresponsive versus syncopal episode-level 1 trauma Systolic BP 60-BUN/creatinine 897/5.6 WBC 12 lactic acid 3.8 CXR cardiomegaly opacity CT head spine no acute abnormality CT chest multifocal pneumonia mucous plugging hematoma right rectus abdominis Admitted to ICU because of persistent hypotension presumed septic shock 6/18 still on NE. POCUS showed collapsible IVC so added back IVFs=. E ECHO -EF 35-40% global hypokinesis, gd II diastolic dysfxn, mild red'd RV fxn w/ severely dilated RA and LA, at time of formal echo IVC w/ less than 50% resp variability  6/19 still on pressors 6/20 still on minimal pressors, arterial line placed - confirmed BP. Albumin  given. IV fluids given. 6/21 remained on vasopressors, midodrine  added, minimal urine output, CVP 10+ 6/22 Levophed  stopped 6/23 transferred [facility] to [facility]--- goals of care discussion yielded no escalation in management--repeat CT abdomen/pelvis no acute pathology incidental findings simple cysts throughout both kidneys 2.4X 2.6 cm, anasarca 6/24 CXR suggestive improvement infiltrate on left   Plan  Septic shock secondary to?  Multifocal pneumonia with parainfluenza Suspected UTI Vancomycin  completed 6/22, Zosyn  completed 6/18 blood cultures all negative Urine culture shows  lactobacillus which would be adequately treated Cortisol a.m. was 24, given stress dose steroids currently on prednisone  40, EOT 6/26 Chest x-ray to my over read shows clearing of left lower lobe infiltrate  Syncope unresponsiveness with elevated troponin Previous HFpEF Co. ox not indicating cardiogenic shock--- suggestive septic shock Increase midodrine  to 15 3 times daily now still quite hypotensive with low flow--- stop oral opiates Echocardiogram as above  Acute superimposed on underlying renal failure presumed-unclear-previously on losartan 100, torsemide 40 Hyperkalemia Creatinine continues to rise with now some mild hyperkalemia and anion gap--- no labs again today yet Note received vancomycin  until 6/22 UOP +3.4 L weight is up from 128 to 137 kg---- have discussed clearly with family after discussing with nephrology however that our consolidated recommendation would be no dialysis  Rectus abdominis hematoma Repeat CT scan as above shows no bleeding in the subcutaneous tissues of abdomen  Previous COPD On guaifenesin  600 every 12 DuoNeb 3 mL every 4 as needed and continues steroid  DM TY 2 CBG better controlled 120-170 Continue 3 times daily insulin  for now and adjust as needed allow diet  Hypothyroid Continue Synthroid  125 daily  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 Not a candidate for any systemic therapy  Previous bedbound state nonambulatory BMI >50 Extremely high risk with elevated BMI for decompensation  Sat down, face-to-face discussion to make sure Lindsey-[phone]--- she tells me that patient has been ill for a while since 2013 but severely declined over the past year--she understands current hospitalization  scenario ---- likely having worsening kidney function and poor responsivity even though kidney function might improve she is agreeable to referral to hospice-I will confirm with labs and maybe change patient to hospice later as per our discussion and she will  let her  brother know  DVT prophylaxis: Heparin   Status is: Inpatient Remains inpatient appropriate because:   Remains ill   Subjective:  Awake coherent x 1 only knows in hospital but cannot tell me anything else About to work with therapy   Objective + exam Vitals:   07/08/23 0000 07/08/23 0300 07/08/23 0500 07/08/23 0800  BP: 92/65 102/71  109/77  Pulse: 64 71  78  Resp: (!) 9 11  11   Temp:  97.6 F (36.4 C)  (!) 97.5 F (36.4 C)  TempSrc:  Axillary  Oral  SpO2: 96% 95%  95%  Weight:   (!) 137.3 kg   Height:       Filed Weights   07/06/23 0500 07/07/23 0300 07/08/23 0500  Weight: (!) 136.8 kg (!) 138.2 kg (!) 137.3 kg    Examination: Thick neck Mallampati 4 no icterus no pallor S1-S2 no murmur seems to be sinus rhythm ROM intact Abdominal soft no rebound no guarding Prevalon boots in place She does have a flap and some asterixis Power overall 5/5 cooperating-deferred DTR sensory  Data Reviewed: reviewed   CBC    Component Value Date/Time   WBC 11.7 (H) 07/07/2023 0931   RBC 4.05 07/07/2023 0931   HGB 12.0 07/07/2023 0931   HCT 36.7 07/07/2023 0931   PLT 441 (H) 07/07/2023 0931   MCV 90.6 07/07/2023 0931   MCH 29.6 07/07/2023 0931   MCHC 32.7 07/07/2023 0931   RDW 17.2 (H) 07/07/2023 0931   LYMPHSABS 1.5 07/07/2023 0931   MONOABS 1.2 (H) 07/07/2023 0931   EOSABS 0.0 07/07/2023 0931   BASOSABS 0.0 07/07/2023 0931      Latest Ref Rng & Units 07/07/2023    9:31 AM 07/06/2023    8:23 AM 07/05/2023    8:43 AM  CMP  Glucose 70 - 99 mg/dL 890  862  836   BUN 8 - 23 mg/dL 898  95  92   Creatinine 0.44 - 1.00 mg/dL 5.73  5.96  6.06   Sodium 135 - 145 mmol/L 132  133  132   Potassium 3.5 - 5.1 mmol/L 5.3  4.7  4.6   Chloride 98 - 111 mmol/L 95  96  97   CO2 22 - 32 mmol/L 18  21  19    Calcium  8.9 - 10.3 mg/dL 9.2  9.0  8.9   Total Protein 6.5 - 8.1 g/dL   6.2   Total Bilirubin 0.0 - 1.2 mg/dL   1.5   Alkaline Phos 38 - 126 U/L   136   AST 15 - 41 U/L    14   ALT 0 - 44 U/L   15     Scheduled Meds:  ascorbic acid   500 mg Oral Daily   brimonidine   1 drop Both Eyes TID   Chlorhexidine  Gluconate Cloth  6 each Topical Daily   feeding supplement  237 mL Oral BID BM   guaiFENesin   600 mg Oral Q12H   heparin  injection (subcutaneous)  5,000 Units Subcutaneous Q8H   insulin  aspart  0-9 Units Subcutaneous TID WC   levothyroxine   125 mcg Oral Q0600   midodrine   15 mg Oral TID WC   multivitamin with minerals  1 tablet Oral Daily   mouth rinse  15 mL Mouth Rinse 4 times per day   polyethylene glycol  17 g Oral BID   pravastatin   40 mg Oral q1800   predniSONE   40 mg Oral Q breakfast   sodium zirconium cyclosilicate  10 g Oral Daily   timolol   1 drop Right Eye BID   zinc  sulfate (50mg  elemental zinc )  220 mg Oral Daily   Continuous Infusions:  Time 40  Jai-Gurmukh Jodee Wagenaar, MD  [facility]

## 2023-07-08 NOTE — Evaluation (Addendum)
 Occupational Therapy Evaluation Patient Details Name: Mary [name] MRN: [id number] DOB: [birth year] Today's Date: 07/08/2023   History of Present Illness   The pt is a 78 yo female presenting 6/17 after a ground-level fall followed by syncopal episode while in supine. Admitted for management of septic shock, hospital course complicated by continued need for pressor support. PMH includes: HTN, HFpEF, DM II, immobility, lymphedema, HTN retinopathy, HLD, chronic pain, and obesity (BMI 47.7).     Clinical Impressions Pt dependent at baseline for ADLs, staff utilizes hoyer lift to transport pt OOB to electric w/c ~1x/week. Pt currently close to baseline needing max -total A for ADL and total A +2 to roll for repositioning. Pt states name and hospital when asked where she is, minimal verbalizations throughout. Typically pt very talkative per family. Family states they hope to take pt home with 24/7 caregivers at d/c vs SNF, but are working to get this in place. Pt presenting with impairments listed below, will follow acutely. Currently, recommend postacute rehab <3 hours/day at d/c, unless family is able to hire caregivers for 24/7 support, then could go home with HHOT.     If plan is discharge home, recommend the following:   Two people to help with walking and/or transfers;Two people to help with bathing/dressing/bathroom;Assistance with cooking/housework;Direct supervision/assist for medications management;Direct supervision/assist for financial management;Assist for transportation;Help with stairs or ramp for entrance     Functional Status Assessment   Patient has had a recent decline in their functional status and demonstrates the ability to make significant improvements in function in a reasonable and predictable amount of time.     Equipment Recommendations   Hoyer lift (has w/c and hospital bed)     Recommendations for Other Services   PT consult      Precautions/Restrictions   Precautions Precautions: Fall Recall of Precautions/Restrictions: Impaired Precaution/Restrictions Comments: lymphedema, soft BP Restrictions Weight Bearing Restrictions Per Provider Order: No     Mobility Bed Mobility Overal bed mobility: Needs Assistance Bed Mobility: Rolling Rolling: Total assist, +2 for physical assistance              Transfers                   General transfer comment: deferred      Balance                                           ADL either performed or assessed with clinical judgement   ADL Overall ADL's : Needs assistance/impaired Eating/Feeding: Maximal assistance;Bed level   Grooming: Maximal assistance;Bed level   Upper Body Bathing: Total assistance   Lower Body Bathing: Total assistance   Upper Body Dressing : Total assistance   Lower Body Dressing: Total assistance   Toilet Transfer: Total assistance   Toileting- Clothing Manipulation and Hygiene: Total assistance   Tub/ Shower Transfer: Total assistance           Vision   Additional Comments: pt blind at baseline     Perception Perception: Not tested       Praxis Praxis: Not tested       Pertinent Vitals/Pain Pain Assessment Pain Assessment: Faces Pain Score: 2  Faces Pain Scale: Hurts a little bit Pain Location: generalized Pain Descriptors / Indicators: Discomfort, Grimacing Pain Intervention(s): Limited activity within patient's tolerance, Monitored during session, Repositioned     Extremity/Trunk  Assessment Upper Extremity Assessment Upper Extremity Assessment: Generalized weakness;Difficult to assess due to impaired cognition (LUE > RUE strength/ROM, noted mild edema to RUE)   Lower Extremity Assessment Lower Extremity Assessment: Defer to PT evaluation   Cervical / Trunk Assessment Cervical / Trunk Assessment: Other exceptions Cervical / Trunk Exceptions: large body habitus    Communication Communication Communication: Impaired Factors Affecting Communication: Difficulty expressing self   Cognition Arousal: Lethargic Behavior During Therapy: Flat affect Cognition: Cognition impaired   Orientation impairments: Situation, Time Awareness: Online awareness impaired, Intellectual awareness impaired Memory impairment (select all impairments): Short-term memory Attention impairment (select first level of impairment): Focused attention Executive functioning impairment (select all impairments): Initiation OT - Cognition Comments: states name, and that she is in the hospital, minimal verbalizations during session, per family pt typically very talkative                 Following commands: Impaired Following commands impaired: Follows one step commands inconsistently, Follows one step commands with increased time     Cueing  General Comments   Cueing Techniques: Verbal cues;Gestural cues;Tactile cues;Visual cues  VSS   Exercises     Shoulder Instructions      Home Living Family/patient expects to be discharged to:: Skilled nursing facility                                 Additional Comments: [facility]      Prior Functioning/Environment Prior Level of Function : Needs assist;History of Falls (last six months)             Mobility Comments: OOB to electric w/ via hoyer 1x/week per family ADLs Comments: assist for all ADLs    OT Problem List: Decreased strength;Decreased range of motion;Decreased activity tolerance;Impaired balance (sitting and/or standing);Impaired vision/perception;Decreased cognition;Decreased safety awareness   OT Treatment/Interventions: Self-care/ADL training;Therapeutic exercise;DME and/or AE instruction;Energy conservation;Therapeutic activities;Balance training;Cognitive remediation/compensation;Visual/perceptual remediation/compensation;Patient/family education      OT Goals(Current goals can be  found in the care plan section)   Acute Rehab OT Goals Patient Stated Goal: did not state OT Goal Formulation: Patient unable to participate in goal setting Time For Goal Achievement: 07/22/23 Potential to Achieve Goals: Fair ADL Goals Pt/caregiver will Perform Home Exercise Program: Increased ROM;Increased strength;Both right and left upper extremity;With written HEP provided Additional ADL Goal #1: pt will follow 2 step command with min cues in prep for ADLs Additional ADL Goal #2: pt will be able to roll with 1 person assist in prep for bed level ADLs with caregiver   OT Frequency:  Min 1X/week    Co-evaluation              AM-PAC OT 6 Clicks Daily Activity     Outcome Measure Help from another person eating meals?: A Lot Help from another person taking care of personal grooming?: A Lot Help from another person toileting, which includes using toliet, bedpan, or urinal?: Total Help from another person bathing (including washing, rinsing, drying)?: Total Help from another person to put on and taking off regular upper body clothing?: Total Help from another person to put on and taking off regular lower body clothing?: Total 6 Click Score: 8   End of Session Nurse Communication: Mobility status  Activity Tolerance: Patient tolerated treatment well Patient left: in bed;with call bell/phone within reach;with nursing/sitter in room;with family/visitor present  OT Visit Diagnosis: Muscle weakness (generalized) (M62.81);Low vision, both eyes (H54.2);Other symptoms  and signs involving cognitive function                Time: 9040-8975 OT Time Calculation (min): 25 min Charges:  OT General Charges $OT Visit: 1 Visit OT Evaluation $OT Eval Low Complexity: 1 Low OT Treatments $Therapeutic Activity: 8-22 mins  Ciria Bernardini K, OTD, OTR/L SecureChat Preferred Acute Rehab [phone]   Laneta POUR Koonce 07/08/2023, 11:21 AM

## 2023-07-08 NOTE — Consult Note (Signed)
 Consultation Note Date: 07/08/2023   Patient Name: DEMETRICA ZIPP  DOB: 1946-01-07  MRN: [id number]  Age / Sex: 78 y.o., female  PCP: Patient, No Pcp Per Referring Physician: Royal Sill, MD  Reason for Consultation:  unclear goc  HPI/Patient Profile: 78 y.o. female  with past medical history of  DM, HTN, lymphedema, chronic pain, immobility, HFpEF,  admitted on 07/01/2023 from assisted living with fall. Workup revealed septic shock. She was positive for parainfluenza. CT indicated multifocal pneumonia. Recovery has been complicated by persistent renal failure and not a candidate for dialysis. Palliative consulted for GOC.    Primary Decision Maker NEXT OF KIN - pt daughterGLENWOOD Mania  Discussion: Chart reviewed including labs, progress notes, imaging from this and previous encounters.  Labs- Cr today is 4.42 and continues to trend up. BUN 108. Hyperkalemic at 5.3.  CT CAP from yesterday reviewed- no acute findings.  Attending MD note reviewed- he has discussed possible referral to hospice with daughter.  On eval- patient is somnolent- does not arouse to my voice or touch.  Daughter present- we met outside of the room in conference.  Social worker- Clarita- from [facility] was placed on speakerphone.  [name] shared her understanding that patient continues to have symptomatic renal impairment. She is in agreement with hospice care.  Hospice philosophy of care and services was reviewed.  Per Clarita from [facility]- if patient wanted to take patient home, then would need to revoke [facility] and enroll with Hospice- or [facility] could continue hospice type care and then refer to hospice later if inpatient hospice placement was needed.  After review of [facility] services and hospice services in the home- patient's daughter requested evaluation by inpatient hospice facility as she was concerned about patient's symptoms being able to  be managed well at home.  We discussed the difference between continue aggressive medical interventions vs transition to comfort measures only. Discussed transition to comfort measures only which includes stopping IV fluids, antibiotics, labs and providing symptom management for SOB, anxiety, nausea, vomiting, and other symptoms of dying.  [name] would like comfort medications added for her mom- but is not ready to stop other interventions. She understands that these interventions will not continue at inpatient hospice and is in agreement with transition to full comfort once patient transfer to hospice facility. [name] requests referral to [facility] based on her prior experiences with them.   SUMMARY OF RECOMMENDATIONS -TOC referral placed for inpatient hospice evaluation -Start hydromorphone 0.25mg  IV q2hr prn for SOB -Continue other medications and interventions until patient is discharged- hopefully to inpatient hospice    Code Status/Advance Care Planning:   Code Status: Limited: Do not attempt resuscitation (DNR) -DNR-LIMITED -Do Not Intubate/DNI     Prognosis:   < 2 weeks  Discharge Planning: Hospice facility- pending evaluation and bed availability  Primary Diagnoses: Present on Admission:  Septic shock (HCC)   Review of Systems  Unable to perform ROS: Mental status change    Physical Exam Vitals and nursing note reviewed.  Constitutional:  Appearance: She is ill-appearing.   Neurological:     Comments: lethargic    Vital Signs: BP 103/78   Pulse 71   Temp (!) 97.5 F (36.4 C) (Oral)   Resp 12   Ht 5' 7 (1.702 m)   Wt (!) 137.3 kg   SpO2 96%   BMI 47.41 kg/m  Pain Scale: Faces   Pain Score: 3    SpO2: SpO2: 96 % O2 Device:SpO2: 96 % O2 Flow Rate: .O2 Flow Rate (L/min): 2 L/min  IO: Intake/output summary:  Intake/Output Summary (Last 24 hours) at 07/08/2023 1440 Last data filed at 07/08/2023 0542 Gross per 24 hour  Intake 570.29 ml  Output 350  ml  Net 220.29 ml    LBM: Last BM Date : 07/07/23 Baseline Weight: Weight: (!) 167 kg Most recent weight: Weight: (!) 137.3 kg       Thank you for this consult. Palliative medicine will continue to follow and assist as needed.  Time Total: 90 minutes Signed by: Cassondra Stain, AGNP-C Palliative Medicine  Time includes:   Preparing to see the patient (e.g., review of tests) Obtaining and/or reviewing separately obtained history Performing a medically necessary appropriate examination and/or evaluation Counseling and educating the patient/family/caregiver Ordering medications, tests, or procedures Referring and communicating with other health care professionals (when not reported separately) Documenting clinical information in the electronic or other health record Independently interpreting results (not reported separately) and communicating results to the patient/family/caregiver Care coordination (not reported separately) Clinical documentation   Please contact Palliative Medicine Team phone at [phone] for questions and concerns.  For individual provider: See [facility]

## 2023-07-08 NOTE — TOC Progression Note (Addendum)
 Transition of Care (TOC) - Progression Note  Mary Gobble RN,BSN Transitions of Care Unit 4NP (Non Trauma)- RN Case Manager See Treatment Team for direct Phone #   Patient Details  Name: Mary [name] MRN: [id number] Date of Birth: 1945/08/23  Transition of Care [facility]) CM/SW Contact  [name], Mary Hurst, RN Phone Number: 07/08/2023, 2:26 PM  Clinical Narrative:    Msg received from bedside RN that family had questions about Hospice.   CM went to room and spoke with daughter and son at bedside, they expressed that they were waiting to speak with MD about lab results and pt's condition.  Discussed with them [facility] and Hospice options. Confirmed that plan is for pt to return home, they do not want pt to go back to a SNF. They are unsure at this time if they want Hospice services until they speak with MD.  CM to follow up.   1420- MD has spoken with family, referral in for Hospice needs. Call made to daughter- Mary [name]- confirmed that they want to add Hospice services along with [facility]. Choice offered for contracted [facility]- daughter voiced she would like to use [facility]. Daughter also voiced that [facility] had picked up DME from the home when pt went to SNF- family will need DME for home delivered prior to discharge- daughter has left [facility] SW-Mary [name] a VM.  Pt will transport home vis EMS- address in epic confirmed.   GOLD DNR on chart.   Call made to [facility]- sw- Mary [name]- VM left regarding transition plan for return home w/ Hospice needs and DME. Awaiting return call.   1500- Msg received from [facility]- they met w/ daughter and also spoke with [facility] SW-Mary [name] on speaker phone- daughter and Mary [name] would both like pt evaluated by [facility] for [employer]- msg sent to [facility] liaison- Mary [name]- who will follow up (no beds at [facility] for today per [facility])    Expected Discharge Plan: Skilled Nursing Facility Barriers to Discharge: Equipment Delay  Expected Discharge Plan and  Services In-house Referral: Clinical Social Work Discharge Planning Services: CM Consult Post Acute Care Choice: Hospice Living arrangements for the past 2 months: Skilled Nursing Facility                 DME Arranged: Hospice Equipment Package Others           HH Agency: [facility], Other - See comment ([facility]) Date HH Agency Contacted: 07/08/23 Time HH Agency Contacted: 1426 Representative spoke with at [facility] Agency: Mary [name]   Social Determinants of Health (SDOH) Interventions SDOH Screenings   Food Insecurity: No Food Insecurity (07/06/2023)  Housing: Low Risk  (07/06/2023)  Transportation Needs: No Transportation Needs (07/06/2023)  Utilities: Not At Risk (07/06/2023)  Social Connections: Unknown (07/06/2023)  Tobacco Use: Low Risk  (07/01/2023)    Readmission Risk Interventions     No data to display

## 2023-07-08 NOTE — Plan of Care (Signed)

## 2023-07-08 NOTE — TOC Progression Note (Addendum)
 Transition of Care [facility]) - Progression Note    Patient Details  Name: DEYONA SOZA MRN: [id number] Date of Birth: [birth year]  Transition of [facility]) CM/SW Contact  Montie LOISE Louder, [place] Phone Number: 07/08/2023, 11:21 AM  Clinical Narrative:     TOC continues to follow for needs   Montie Louder, MSW, LCSW Clinical Social Worker    Expected Discharge Plan: Skilled Nursing Facility Barriers to Discharge: Continued Medical Work up  Expected Discharge Plan and Services In-house Referral: Clinical Social Work   Post Acute Care Choice: Skilled Nursing Facility Living arrangements for the past 2 months: Skilled Nursing Facility                                       Social Determinants of Health (SDOH) Interventions SDOH Screenings   Food Insecurity: No Food Insecurity (07/06/2023)  Housing: Low Risk  (07/06/2023)  Transportation Needs: No Transportation Needs (07/06/2023)  Utilities: Not At Risk (07/06/2023)  Social Connections: Unknown (07/06/2023)  Tobacco Use: Low Risk  (07/01/2023)    Readmission Risk Interventions     No data to display

## 2023-07-08 NOTE — Progress Notes (Signed)
 Patient ID: EDEE NIFONG, female   DOB: [birth year], 78 y.o.   MRN: [id number] S: Remains lethargic and minimally responsive this morning O:BP 109/77 (BP Location: Right Wrist)   Pulse 78   Temp (!) 97.5 F (36.4 C) (Oral)   Resp 11   Ht 5' 7 (1.702 m)   Wt (!) 137.3 kg   SpO2 95%   BMI 47.41 kg/m   Intake/Output Summary (Last 24 hours) at 07/08/2023 1055 Last data filed at 07/08/2023 0542 Gross per 24 hour  Intake 570.29 ml  Output 350 ml  Net 220.29 ml   Intake/Output: I/O last 3 completed shifts: In: 570.3 [P.O.:120; IV Piggyback:450.3] Out: 700 [Urine:700]  Intake/Output this shift:  No intake/output data recorded. Weight change: -0.9 kg Gen: morbidly obese, lethargic CVS: RRR Resp:CTA Abd: obese, +BS, soft, NT/ND Ext: chronic lymphedema bilaterally  Recent Labs  Lab 07/01/23 1111 07/01/23 1121 07/02/23 0518 07/03/23 1243 07/03/23 1254 07/04/23 0554 07/05/23 0843 07/06/23 0823 07/07/23 0931  NA 133* 133* 131* 131*  --  132* 132* 133* 132*  K 4.9 4.8 4.4 4.5  --  4.5 4.6 4.7 5.3*  CL 96* 97* 99 97*  --  98 97* 96* 95*  CO2 22  --  22 22  --  20* 19* 21* 18*  GLUCOSE 134* 127* 132* 177*  --  184* 163* 137* 109*  BUN 102* 98* 94* 91*  --  93* 92* 95* 101*  CREATININE 4.37* 4.20* 3.82* 3.40*  --  3.65* 3.93* 4.03* 4.26*  ALBUMIN  2.5*  --  2.4* 2.1* 2.1* 2.0* 3.0*  --  2.8*  CALCIUM  8.6*  --  8.2* 8.2*  --  8.5* 8.9 9.0 9.2  PHOS  --   --  5.4*  --   --   --   --   --  6.3*  AST 35  --  26 16 16  14* 14*  --   --   ALT 31  --  29 22 20 19 15   --   --    Liver Function Tests: Recent Labs  Lab 07/03/23 1254 07/04/23 0554 07/05/23 0843 07/07/23 0931  AST 16 14* 14*  --   ALT 20 19 15   --   ALKPHOS 210* 168* 136*  --   BILITOT 2.1* 1.3* 1.5*  --   PROT 6.0* 5.7* 6.2*  --   ALBUMIN  2.1* 2.0* 3.0* 2.8*   No results for input(s): LIPASE, AMYLASE in the last 168 hours. Recent Labs  Lab 07/07/23 1549  AMMONIA 48*   CBC: Recent Labs  Lab  07/03/23 0509 07/04/23 0554 07/05/23 0843 07/06/23 0823 07/07/23 0931  WBC 12.1* 10.4 11.0* 11.1* 11.7*  NEUTROABS  --   --  8.5* 8.9* 8.9*  HGB 12.1 11.2* 10.6* 10.9* 12.0  HCT 36.3 34.2* 32.6* 33.2* 36.7  MCV 89.4 90.0 90.1 90.5 90.6  PLT 370 441* 404* 449* 441*   Cardiac Enzymes: No results for input(s): CKTOTAL, CKMB, CKMBINDEX, TROPONINI in the last 168 hours. CBG: Recent Labs  Lab 07/07/23 0800 07/07/23 1213 07/07/23 1642 07/07/23 2137 07/08/23 0838  GLUCAP 106* 117* 120* 141* 126*    Iron Studies: No results for input(s): IRON, TIBC, TRANSFERRIN, FERRITIN in the last 72 hours. Studies/Results: DG CHEST PORT 1 VIEW Result Date: 07/08/2023 CLINICAL DATA:  Evaluate for pneumonia. EXAM: PORTABLE CHEST 1 VIEW COMPARISON:  07/04/2023 FINDINGS: Interval removal of left IJ catheter. Stable cardiac enlargement. Lung volumes are low with asymmetric elevation of the left  hemidiaphragm. Continued interval improvement in left perihilar and left lower lung opacities. No new findings. IMPRESSION: Continued interval improvement in left perihilar and left lower lung opacities. Cardiomegaly. Electronically Signed   By: Waddell Calk M.D.   On: 07/08/2023 07:04   CT ABDOMEN PELVIS WO CONTRAST Result Date: 07/07/2023 CLINICAL DATA:  Abdominal pain, acute, nonlocalized. EXAM: CT ABDOMEN AND PELVIS WITHOUT CONTRAST TECHNIQUE: Multidetector CT imaging of the abdomen and pelvis was performed following the standard protocol without IV contrast. RADIATION DOSE REDUCTION: This exam was performed according to the departmental dose-optimization program which includes automated exposure control, adjustment of the mA and/or kV according to patient size and/or use of iterative reconstruction technique. COMPARISON:  CT scan abdomen and pelvis from 07/01/2023. FINDINGS: Lower chest: There are patchy atelectatic changes in the visualized lung bases. No overt consolidation. No pleural effusion.  The heart is mildly enlarged in size. No pericardial effusion. Hepatobiliary: The liver is normal in size. Non-cirrhotic configuration. No suspicious mass. No intrahepatic or extrahepatic bile duct dilation. No calcified gallstones. Normal gallbladder wall thickness. No pericholecystic inflammatory changes. Pancreas: Unremarkable. No pancreatic ductal dilatation or surrounding inflammatory changes. Spleen: Within normal limits. No focal lesion. Adrenals/Urinary Tract: Adrenal glands are unremarkable. There are several simple cysts throughout bilateral kidneys with largest in the right kidney upper pole measuring up to 2.4 x 2.6 cm. No nephroureterolithiasis or obstructive uropathy. Urinary bladder is decompressed by Foley catheter, precluding optimal assessment. However, no large mass or stones identified. No perivesical fat stranding. Stomach/Bowel: No disproportionate dilation of the small or large bowel loops. No evidence of abnormal bowel wall thickening or inflammatory changes. The appendix is unremarkable. There are multiple diverticula throughout the colon, without imaging signs of diverticulitis. Vascular/Lymphatic: There is trace amount of ascites in the dependent pelvis. No walled-off abscess or loculated collection. No pneumoperitoneum. No abdominal or pelvic lymphadenopathy, by size criteria. No aneurysmal dilation of the major abdominal arteries. There are mild peripheral atherosclerotic vascular calcifications of the aorta and its major branches. Reproductive: The uterus is unremarkable. No large adnexal mass. Other: There is moderate anasarca. The soft tissues and abdominal wall are otherwise unremarkable. Musculoskeletal: No suspicious osseous lesions. There are mild multilevel degenerative changes in the visualized spine. IMPRESSION: 1. No acute inflammatory process identified within the abdomen or pelvis. 2. Multiple other nonacute observations, as described above. Aortic Atherosclerosis  (ICD10-I70.0). Electronically Signed   By: Ree Molt M.D.   On: 07/07/2023 15:45    ascorbic acid   500 mg Oral Daily   brimonidine   1 drop Both Eyes TID   Chlorhexidine  Gluconate Cloth  6 each Topical Daily   feeding supplement  237 mL Oral BID BM   guaiFENesin   600 mg Oral Q12H   heparin  injection (subcutaneous)  5,000 Units Subcutaneous Q8H   insulin  aspart  0-9 Units Subcutaneous TID WC   levothyroxine   125 mcg Oral Q0600   midodrine   15 mg Oral TID WC   multivitamin with minerals  1 tablet Oral Daily   mouth rinse  15 mL Mouth Rinse 4 times per day   polyethylene glycol  17 g Oral BID   pravastatin   40 mg Oral q1800   predniSONE   40 mg Oral Q breakfast   sodium zirconium cyclosilicate  10 g Oral Daily   timolol   1 drop Right Eye BID   zinc  sulfate (50mg  elemental zinc )  220 mg Oral Daily    BMET    Component Value Date/Time   NA 132 (L)  07/07/2023 0931   K 5.3 (H) 07/07/2023 0931   CL 95 (L) 07/07/2023 0931   CO2 18 (L) 07/07/2023 0931   GLUCOSE 109 (H) 07/07/2023 0931   BUN 101 (H) 07/07/2023 0931   CREATININE 4.26 (H) 07/07/2023 0931   CALCIUM  9.2 07/07/2023 0931   GFRNONAA 10 (L) 07/07/2023 0931   CBC    Component Value Date/Time   WBC 11.7 (H) 07/07/2023 0931   RBC 4.05 07/07/2023 0931   HGB 12.0 07/07/2023 0931   HCT 36.7 07/07/2023 0931   PLT 441 (H) 07/07/2023 0931   MCV 90.6 07/07/2023 0931   MCH 29.6 07/07/2023 0931   MCHC 32.7 07/07/2023 0931   RDW 17.2 (H) 07/07/2023 0931   LYMPHSABS 1.5 07/07/2023 0931   MONOABS 1.2 (H) 07/07/2023 0931   EOSABS 0.0 07/07/2023 0931   BASOSABS 0.0 07/07/2023 0931    Assessment/Plan: 78 year old BF- main issues seem to be obesity, lymphedema, chronic pain  and immobility and also chronic medical issues to include DM, HTN-  she now presents with crt of 4 1.Renal- She has another MR number and different records which have not been merged yet.  After review of those records, her baseline Scr had ranged 0.96-1.5  since 2021 but was 0.96-1.13 over the past year.  She presented to [facility] on 04/[facility]/23 after a mechanical fall at home and her Scr was noted to be 3.1 at that time.  She refused admission and left AMA.  She was taking telmisartan at that time.  She does have underlying CKD stage IIIa due to DM , HTN and kidneys that appear atrophic on imaging.  Presumably ischemic ATN in the setting of UTI, hypotension and concomitant ARB therapy.  Losartan has been held and she is on broad spectrum antibiotics.  She was given pressors and some fluid initially but off IVF's and levophed .  Her blood pressures remain to be variable.  Kidney function initially improved but now worsening again, however labs are pending from this morning.  Vanc level is slightly high and was up to 32 yesterday-  vancomycin  has been discontinued.  Hemodynamically she is worsening.  K up to 5.3, Co2 down to 18, and BUN/Cr up to 101/4.26.  She was given some IVF's yesterday to raise blood pressure but she may need to transfer back to ICU for pressors if her BP continues to drop.  No emergent indications for dialysis at this time, but she is not a candidate for outpatient dialysis given her bed bound status and morbid obesity.  Her daughter is at the bedside and agrees not to pursue dialysis.  Will need palliative care to help set goals/limits of care.    - Avoid nephrotoxic medications including NSAIDs and iodinated intravenous contrast exposure unless the latter is absolutely indicated.   - Preferred narcotic agents for pain control are hydromorphone, fentanyl, and methadone. Morphine should not be used.  - Avoid Baclofen and avoid oral sodium phosphate and magnesium citrate based laxatives / bowel preps.  - Continue strict Input and Output monitoring. Will monitor the patient closely with you and intervene or adjust therapy as indicated by changes in clinical status/labs    2. Hypertension/volume  - has chronic lymphedema.  Blood pressures are dropping  despite 15 mg midodrine  tid so would not use diuretic.  IVF's as above.  May need transfer back to ICU for pressors. 3. UTI-   will recheck urine since has been on abx but grew lactobacillus.   4. Anemia  -  has not been that remarkable which argues a little against longstanding CKD 5. Hyperkalemia - will start Lokelma and follow. 6. Rectus abdominis hematoma -  Hgb up to 12 today.  Continue to follow.  7. Parainfluenza 3 +  - droplet precaution, plan per primary.  8. Disposition - pt with super morbid obesity and is bed bound.  Not a candidate for long term dialysis.  Recommend palliative care to help set goals/limits of care.    Fairy RONAL Sellar, MD [facility]

## 2023-07-09 DIAGNOSIS — I132 Hypertensive heart and chronic kidney disease with heart failure and with stage 5 chronic kidney disease, or end stage renal disease: Secondary | ICD-10-CM

## 2023-07-09 DIAGNOSIS — Z7189 Other specified counseling: Secondary | ICD-10-CM | POA: Diagnosis not present

## 2023-07-09 DIAGNOSIS — A419 Sepsis, unspecified organism: Secondary | ICD-10-CM | POA: Diagnosis not present

## 2023-07-09 DIAGNOSIS — J189 Pneumonia, unspecified organism: Secondary | ICD-10-CM | POA: Diagnosis not present

## 2023-07-09 LAB — RENAL FUNCTION PANEL
Albumin: 2.9 g/dL — ABNORMAL LOW (ref 3.5–5.0)
Anion gap: 16 — ABNORMAL HIGH (ref 5–15)
BUN: 111 mg/dL — ABNORMAL HIGH (ref 8–23)
CO2: 19 mmol/L — ABNORMAL LOW (ref 22–32)
Calcium: 9.2 mg/dL (ref 8.9–10.3)
Chloride: 96 mmol/L — ABNORMAL LOW (ref 98–111)
Creatinine, Ser: 4.63 mg/dL — ABNORMAL HIGH (ref 0.44–1.00)
GFR, Estimated: 9 mL/min — ABNORMAL LOW (ref >60)
Glucose, Bld: 95 mg/dL (ref 70–99)
Phosphorus: 7.6 mg/dL — ABNORMAL HIGH (ref 2.5–4.6)
Potassium: 5.6 mmol/L — ABNORMAL HIGH (ref 3.5–5.1)
Sodium: 131 mmol/L — ABNORMAL LOW (ref 135–145)

## 2023-07-09 LAB — GLUCOSE, CAPILLARY
Glucose-Capillary: 101 mg/dL — ABNORMAL HIGH (ref 70–99)
Glucose-Capillary: 92 mg/dL (ref 70–99)
Glucose-Capillary: 95 mg/dL (ref 70–99)
Glucose-Capillary: 126 mg/dL — ABNORMAL HIGH (ref 70–99)

## 2023-07-09 MED ORDER — GERHARDT'S BUTT CREAM
TOPICAL_CREAM | Freq: Every day | CUTANEOUS | Status: DC
  Filled 2023-07-09: qty 60

## 2023-07-09 NOTE — Progress Notes (Signed)
 Daily Progress Note   Patient Name: Mary [name]       Date: 07/09/2023 DOB: 1946/01/09  Age: 78 y.o. MRN#: [id number] Attending Physician: [name] Mignon DASEN, MD Primary Care Physician: Patient, No Pcp Per Admit Date: 07/01/2023  Reason for Consultation/Follow-up: Establishing goals of care  Patient Profile/HPI:   78 y.o. female  with past medical history of  DM, HTN, lymphedema, chronic pain, immobility, HFpEF,  admitted on 07/01/2023 from assisted living with fall. Workup revealed septic shock. She was positive for parainfluenza. CT indicated multifocal pneumonia. Recovery has been complicated by persistent renal failure and not a candidate for dialysis. Palliative consulted for GOC.   Subjective: Chart reviewed including labs, progress notes, imaging from this and previous encounters.  Patient is awake, receiving personal care. When asked if she's in pain she responds oh baby, but doesn't answer any other questions.  Cr continues to increase- 4.63, BUN 111. She had diffuse anasarca. She has urine output only in last 24 hours.   She was unable to take oral meds this morning. She is not eating or drinking.  Discussed with attending Dr. [name] and bedside nurse.   Review of Systems  Unable to perform ROS: Mental status change     Physical Exam Vitals and nursing note reviewed.   Cardiovascular:     Rate and Rhythm: Normal rate.   Musculoskeletal:     Comments: Diffuse anasarca   Neurological:     Mental Status: She is disoriented.             Vital Signs: BP 107/73 (BP Location: Right Arm)   Pulse 74   Temp 97.6 F (36.4 C) (Oral)   Resp 20   Ht 5' 7 (1.702 m)   Wt 133.4 kg   SpO2 97%   BMI 46.06 kg/m  SpO2: SpO2: 97 % O2 Device: O2 Device: Room Air O2 Flow  Rate: O2 Flow Rate (L/min): 2 L/min  Intake/output summary:  Intake/Output Summary (Last 24 hours) at 07/09/2023 1122 Last data filed at 07/09/2023 0400 Gross per 24 hour  Intake --  Output 300 ml  Net -300 ml   LBM: Last BM Date : 07/07/23 Baseline Weight: Weight: (!) 167 kg Most recent weight: Weight: 133.4 kg       Palliative Assessment/Data: PPS: 10%  Patient Active Problem List   Diagnosis Date Noted   Septic shock (HCC) 07/01/2023    Palliative Care Assessment & Plan    Assessment/Recommendations/Plan  Continue current interventions Continue IV hydromorphone as needed for shortness of breath or discomfort Await evaluation for inpatient hospice   Code Status:   Code Status: Limited: Do not attempt resuscitation (DNR) -DNR-LIMITED -Do Not Intubate/DNI    Prognosis:  < 2 weeks  Discharge Planning: Hospice facility - pending availability and acceptance  Care plan was discussed with patient's daughter, Dr. [name], bedside RN  Thank you for allowing the Palliative Medicine Team to assist in the care of this patient.  Total time:  50 mins  Prolonged billing:  Time includes:   Preparing to see the patient (e.g., review of tests) Obtaining and/or reviewing separately obtained history Performing a medically necessary appropriate examination and/or evaluation Counseling and educating the patient/family/caregiver Ordering medications, tests, or procedures Referring and communicating with other health care professionals (when not reported separately) Documenting clinical information in the electronic or other health record Independently interpreting results (not reported separately) and communicating results to the patient/family/caregiver Care coordination (not reported separately) Clinical documentation  Cassondra Stain, AGNP-C Palliative Medicine   Please contact Palliative Medicine Team phone at [phone] for questions and concerns.

## 2023-07-09 NOTE — Plan of Care (Signed)

## 2023-07-09 NOTE — Progress Notes (Signed)
 PROGRESS NOTE  Mary [name] FMW:[id number] DOB: 09/12/1945   PCP: Patient, No Pcp Per  Patient is from: Home.  Bedbound at baseline.  DOA: 07/01/2023 LOS: 8  Chief complaints Chief Complaint  Patient presents with   Fall     Brief Narrative / Interim history: 78 year old F with PMH of DM-2 with retinopathy and CRVO, HFpEF, HTN, hypothyroidism, hyperlipidemia, prior substance abuse and morbid obesity presented to ED after she had a fall out of bed, unresponsive and could not breath, and admitted to ICU with septic shock in the setting of multifocal pneumonia.   Patient was hypotensive with SBP in 60s.  WBC 12.  Cr 4.3.  BUN 102.  Lactic acid 3.8.  CXR with cardiomegaly and some opacity.  CT head without acute finding.  CT chest with multifocal pneumonia, mucous plugging, hematoma and dried rectus abdominis.  Cultures obtained.  Started on broad-spectrum antibiotics and vasopressors..  TTE with LVEF of 35 to 40%, GH, G2 DD and severely dilated RA and LA and mildly reduced RV SF.   Patient came off vasopressor on 6/22 and transferred to [facility] on 6/23.  Patient was deemed to be not a candidate for dialysis.  Palliative medicine consulted and recommended continuing current care with focus on comfort.  TOC consulted for inpatient hospice.    Subjective: Seen and examined earlier this morning.  No major events overnight or this morning.  Patient is sleepy but wakes to voice.  Not a great historian.  Responds no to pain.  Objective: Vitals:   07/08/23 2308 07/09/23 0300 07/09/23 0500 07/09/23 0741  BP: 99/62   107/73  Pulse:    74  Resp: 14   20  Temp: 97.7 F (36.5 C) 97.6 F (36.4 C)  97.6 F (36.4 C)  TempSrc: Axillary Axillary  Oral  SpO2:    97%  Weight:   133.4 kg   Height:        Examination:  GENERAL: No apparent distress.  Nontoxic. HEENT: MMM.  Vision and hearing grossly intact.  NECK: Supple.  No apparent JVD.  RESP:  No IWOB.  Fair  aeration bilaterally but limited exam due to body habitus. CVS:  RRR. Heart sounds normal.  ABD/GI/GU: BS+. Abd soft, NTND.  Foley catheter in place. MSK/EXT:  Moves extremities.  BLE lymphedema. SKIN: no apparent skin lesion or wound NEURO: Sleepy but wakes to voice.  Oriented to self.  Follows some commands.  No apparent focal neuro deficit but limited exam. PSYCH: Calm.  No distress or agitation.  Consultants:  Critical care admitted patient Nephrology Palliative medicine   Procedures: None  Microbiology summarized: 6/17-MRSA PCR screen reactive 6/17-blood cultures NGTD 6/17-RVP with parainfluenza virus 3 6/18-urine culture with lactobacillus species  Assessment and plan: Septic shock secondary to multifocal pneumonia with parainfluenza infection Suspected urinary tract infection -Completed antibiotic course on 6/18. -Cough stress dose steroid. -On room air.   Syncope unresponsiveness with elevated troponin Acute HFrEF: TTE with LVEF 35 to 40% (previously 55 to 60%), GH, G2 DD and severely dilated RA and LA and mildly reduced RV SF.  Does not appear fluid overloaded but difficult exam due to body habitus.  No respiratory distress.  Hypotension: Currently normotensive. -Continue p.o. 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  Evaluated by nephrology.  Evaluated by nephrology.  Not a candidate for outpatient HD.  Patient is bedbound.     Rectus abdominis  hematoma: Repeat CT scan as above shows no bleeding in the subcutaneous tissues of abdomen   History of COPD: Stable. -Bronchodilators as needed   NIDDM-2: A1c 4.8%.  Does not seem to be on medications at home. Recent Labs  Lab 07/08/23 1216 07/08/23 1631 07/08/23 2124 07/09/23 0740 07/09/23 1107  GLUCAP 116* 110* 95 101* 92  - Continue SSI-sensitive   Hypothyroid -Continue Synthroid  125 daily   Spiculated  lung nodules in RLL -Not a candidate for any systemic therapy   Bedbound state nonambulatory Goal of care-DNR with emphasis on comfort. -Appreciate help by palliative medicine -TOC consulted for inpatient hospice.   Morbid obesity Body mass index is 46.06 kg/m. Nutrition Problem: Increased nutrient needs Etiology: wound healing Signs/Symptoms: estimated needs Interventions: Ensure Enlive (each supplement provides 350kcal and 20 grams of protein), MVI   DVT prophylaxis:  heparin  injection 5,000 Units Start: 07/02/23 1530 SCDs Start: 07/01/23 1442  Code Status: DNR Family Communication: None at bedside Level of care: Progressive Status is: Inpatient Remains inpatient appropriate because: Safe disposition/inpatient hospice   Final disposition: Inpatient hospice   55 minutes with more than 50% spent in reviewing records, counseling patient/family and coordinating care.   Sch Meds:  Scheduled Meds:  ascorbic acid   500 mg Oral Daily   brimonidine   1 drop Both Eyes TID   Chlorhexidine  Gluconate Cloth  6 each Topical Daily   feeding supplement  237 mL Oral BID BM   guaiFENesin   600 mg Oral Q12H   heparin  injection (subcutaneous)  5,000 Units Subcutaneous Q8H   insulin  aspart  0-9 Units Subcutaneous TID WC   levothyroxine   125 mcg Oral Q0600   midodrine   15 mg Oral TID WC   mouth rinse  15 mL Mouth Rinse 4 times per day   polyethylene glycol  17 g Oral BID   pravastatin   40 mg Oral q1800   sodium zirconium cyclosilicate  10 g Oral Daily   timolol   1 drop Right Eye BID   zinc  sulfate (50mg  elemental zinc )  220 mg Oral Daily   Continuous Infusions: PRN Meds:.acetaminophen , artificial tears, docusate sodium , HYDROmorphone (DILAUDID) injection, ipratropium-albuterol, naLOXone (NARCAN)  injection, ondansetron  (ZOFRAN ) IV, mouth rinse, mouth rinse, polyethylene glycol, white petrolatum   Antimicrobials: Anti-infectives (From admission, onward)    Start     Dose/Rate Route  Frequency Ordered Stop   07/04/23 2000  vancomycin  (VANCOCIN ) IVPB 1000 mg/200 mL premix        1,000 mg 200 mL/hr over 60 Minutes Intravenous Every 48 hours 07/04/23 1317 07/06/23 2248   07/03/23 0900  vancomycin  (VANCOREADY) IVPB 1500 mg/300 mL        1,500 mg 150 mL/hr over 120 Minutes Intravenous  Once 07/03/23 0812 07/03/23 1215   07/02/23 0000  piperacillin -tazobactam (ZOSYN ) IVPB 2.25 g        2.25 g 100 mL/hr over 30 Minutes Intravenous Every 8 hours 07/01/23 1524 07/07/23 2126   07/01/23 1524  vancomycin  variable dose per unstable renal function (pharmacist dosing)  Status:  Discontinued         Does not apply See admin instructions 07/01/23 1524 07/04/23 1317   07/01/23 1215  ceFEPIme  (MAXIPIME ) 2 g in sodium chloride  0.9 % 100 mL IVPB        2 g 200 mL/hr over 30 Minutes Intravenous  Once 07/01/23 1210 07/01/23 1305   07/01/23 1215  metroNIDAZOLE  (FLAGYL ) IVPB 500 mg        500 mg 100 mL/hr over 60 Minutes Intravenous  Once 07/01/23 1210 07/01/23 1335   07/01/23 1215  vancomycin  (VANCOCIN ) IVPB 1000 mg/200 mL premix  Status:  Discontinued        1,000 mg 200 mL/hr over 60 Minutes Intravenous  Once 07/01/23 1210 07/01/23 1212   07/01/23 1215  vancomycin  (VANCOREADY) IVPB 2000 mg/400 mL        2,000 mg 200 mL/hr over 120 Minutes Intravenous  Once 07/01/23 1212 07/01/23 1448        I have personally reviewed the following labs and images: CBC: Recent Labs  Lab 07/04/23 0554 07/05/23 0843 07/06/23 0823 07/07/23 0931 07/08/23 0913  WBC 10.4 11.0* 11.1* 11.7* 11.6*  NEUTROABS  --  8.5* 8.9* 8.9*  --   HGB 11.2* 10.6* 10.9* 12.0 12.0  HCT 34.2* 32.6* 33.2* 36.7 37.1  MCV 90.0 90.1 90.5 90.6 93.2  PLT 441* 404* 449* 441* 456*   BMP &GFR Recent Labs  Lab 07/05/23 0843 07/06/23 0823 07/07/23 0931 07/08/23 0913 07/09/23 0608  NA 132* 133* 132* 132* 131*  K 4.6 4.7 5.3* 5.3* 5.6*  CL 97* 96* 95* 97* 96*  CO2 19* 21* 18* 19* 19*  GLUCOSE 163* 137* 109* 109* 95   BUN 92* 95* 101* 108* 111*  CREATININE 3.93* 4.03* 4.26* 4.42* 4.63*  CALCIUM  8.9 9.0 9.2 9.0 9.2  PHOS  --   --  6.3* 7.0* 7.6*   Estimated Creatinine Clearance: 15 mL/min (A) (by C-G formula based on SCr of 4.63 mg/dL (H)). Liver & Pancreas: Recent Labs  Lab 07/03/23 1243 07/03/23 1254 07/04/23 0554 07/05/23 0843 07/07/23 0931 07/08/23 0913 07/09/23 0608  AST 16 16 14* 14*  --   --   --   ALT 22 20 19 15   --   --   --   ALKPHOS 203* 210* 168* 136*  --   --   --   BILITOT 1.9* 2.1* 1.3* 1.5*  --   --   --   PROT 6.0* 6.0* 5.7* 6.2*  --   --   --   ALBUMIN  2.1* 2.1* 2.0* 3.0* 2.8* 2.6* 2.9*   No results for input(s): LIPASE, AMYLASE in the last 168 hours. Recent Labs  Lab 07/07/23 1549  AMMONIA 48*   Diabetic: No results for input(s): HGBA1C in the last 72 hours. Recent Labs  Lab 07/08/23 1216 07/08/23 1631 07/08/23 2124 07/09/23 0740 07/09/23 1107  GLUCAP 116* 110* 95 101* 92   Cardiac Enzymes: No results for input(s): CKTOTAL, CKMB, CKMBINDEX, TROPONINI in the last 168 hours. No results for input(s): PROBNP in the last 8760 hours. Coagulation Profile: No results for input(s): INR, PROTIME in the last 168 hours. Thyroid Function Tests: No results for input(s): TSH, T4TOTAL, FREET4, T3FREE, THYROIDAB in the last 72 hours. Lipid Profile: No results for input(s): CHOL, HDL, LDLCALC, TRIG, CHOLHDL, LDLDIRECT in the last 72 hours. Anemia Panel: No results for input(s): VITAMINB12, FOLATE, FERRITIN, TIBC, IRON, RETICCTPCT in the last 72 hours. Urine analysis:    Component Value Date/Time   COLORURINE YELLOW 07/06/2023 1533   APPEARANCEUR TURBID (A) 07/06/2023 1533   LABSPEC 1.021 07/06/2023 1533   PHURINE 5.0 07/06/2023 1533   GLUCOSEU NEGATIVE 07/06/2023 1533   HGBUR SMALL (A) 07/06/2023 1533   BILIRUBINUR NEGATIVE 07/06/2023 1533   KETONESUR NEGATIVE 07/06/2023 1533   PROTEINUR 100 (A) 07/06/2023 1533    NITRITE NEGATIVE 07/06/2023 1533   LEUKOCYTESUR MODERATE (A) 07/06/2023 1533   Sepsis Labs: Invalid input(s): PROCALCITONIN, LACTICIDVEN  Microbiology: Recent Results (from the  past 240 hours)  MRSA Next Gen by PCR, Nasal     Status: Abnormal   Collection Time: 07/01/23  2:42 PM   Specimen: Nasal Mucosa; Nasal Swab  Result Value Ref Range Status   MRSA by PCR Next Gen DETECTED (A) NOT DETECTED Final    Comment: RESULT CALLED TO, READ BACK BY AND VERIFIED WITH: RN B. LEFT [id number] @ 1819 FH (NOTE) The GeneXpert MRSA Assay (FDA approved for NASAL specimens only), is one component of a comprehensive MRSA colonization surveillance program. It is not intended to diagnose MRSA infection nor to guide or monitor treatment for MRSA infections. Test performance is not FDA approved in patients less than 57 years old. Performed at [facility], [address], [place], [place] [address]   Culture, blood (routine x 2)     Status: None   Collection Time: 07/01/23  5:45 PM   Specimen: BLOOD RIGHT HAND  Result Value Ref Range Status   Specimen Description BLOOD RIGHT HAND  Final   Special Requests   Final    BOTTLES DRAWN AEROBIC AND ANAEROBIC Blood Culture adequate volume   Culture   Final    NO GROWTH 5 DAYS Performed at [facility], [address] [place], [place] [address]    Report Status 07/06/2023 FINAL  Final  Culture, blood (routine x 2)     Status: None   Collection Time: 07/01/23  5:50 PM   Specimen: BLOOD RIGHT HAND  Result Value Ref Range Status   Specimen Description BLOOD RIGHT HAND  Final   Special Requests   Final    BOTTLES DRAWN AEROBIC AND ANAEROBIC Blood Culture results may not be optimal due to an inadequate volume of blood received in culture bottles   Culture   Final    NO GROWTH 5 DAYS Performed at [facility], [address], [place], [place] [address]    Report Status 07/06/2023 FINAL  Final  Respiratory (~20 pathogens) panel by PCR      Status: Abnormal   Collection Time: 07/02/23  3:35 AM   Specimen: Nasopharyngeal Swab; Respiratory  Result Value Ref Range Status   Adenovirus NOT DETECTED NOT DETECTED Final   Coronavirus 229E NOT DETECTED NOT DETECTED Final    Comment: (NOTE) The Coronavirus on the Respiratory Panel, DOES NOT test for the novel  Coronavirus (2019 nCoV)    Coronavirus HKU1 NOT DETECTED NOT DETECTED Final   Coronavirus NL63 NOT DETECTED NOT DETECTED Final   Coronavirus OC43 NOT DETECTED NOT DETECTED Final   Metapneumovirus NOT DETECTED NOT DETECTED Final   Rhinovirus / Enterovirus NOT DETECTED NOT DETECTED Final   Influenza A NOT DETECTED NOT DETECTED Final   Influenza B NOT DETECTED NOT DETECTED Final   Parainfluenza Virus 1 NOT DETECTED NOT DETECTED Final   Parainfluenza Virus 2 NOT DETECTED NOT DETECTED Final   Parainfluenza Virus 3 DETECTED (A) NOT DETECTED Final   Parainfluenza Virus 4 NOT DETECTED NOT DETECTED Final   Respiratory Syncytial Virus NOT DETECTED NOT DETECTED Final   Bordetella pertussis NOT DETECTED NOT DETECTED Final   Bordetella Parapertussis NOT DETECTED NOT DETECTED Final   Chlamydophila pneumoniae NOT DETECTED NOT DETECTED Final   Mycoplasma pneumoniae NOT DETECTED NOT DETECTED Final    Comment: Performed at [facility], [address], [place], [place] [address]  Urine Culture (for pregnant, neutropenic or urologic patients or patients with an indwelling urinary catheter)     Status: Abnormal  Collection Time: 07/02/23  3:35 AM   Specimen: Urine, Catheterized  Result Value Ref Range Status   Specimen Description URINE, CATHETERIZED  Final   Special Requests NONE  Final   Culture (A)  Final    >=100,000 COLONIES/mL LACTOBACILLUS SPECIES Standardized susceptibility testing for this organism is not available. Performed at [facility], [address] [place], [place] [address]    Report Status 07/03/2023 FINAL  Final    Radiology Studies: No results  found.    Tnia Anglada T. Girl Schissler [facility]  If 7PM-7AM, please contact night-coverage [email or web address] 07/09/2023, 11:58 AM

## 2023-07-09 NOTE — Progress Notes (Signed)
 Patient ID: Mary [name], female   DOB: 1945/09/06, 78 y.o.   MRN: [id number] S: Family has decided to pursue hospice care O:BP 107/73 (BP Location: Right Arm)   Pulse 74   Temp 97.6 F (36.4 C) (Oral)   Resp 20   Ht 5' 7 (1.702 m)   Wt 133.4 kg   SpO2 97%   BMI 46.06 kg/m   Intake/Output Summary (Last 24 hours) at 07/09/2023 1127 Last data filed at 07/09/2023 0400 Gross per 24 hour  Intake --  Output 300 ml  Net -300 ml   Intake/Output: I/O last 3 completed shifts: In: -  Out: 450 [Urine:450]  Intake/Output this shift:  No intake/output data recorded. Weight change: -3.9 kg Gen: NAD CVS: RRR Resp:CTA Abd: +BS, soft, NT/ND Ext: chronic lymphedema  Recent Labs  Lab 07/03/23 1243 07/03/23 1254 07/04/23 0554 07/05/23 0843 07/06/23 0823 07/07/23 0931 07/08/23 0913 07/09/23 0608  NA 131*  --  132* 132* 133* 132* 132* 131*  K 4.5  --  4.5 4.6 4.7 5.3* 5.3* 5.6*  CL 97*  --  98 97* 96* 95* 97* 96*  CO2 22  --  20* 19* 21* 18* 19* 19*  GLUCOSE 177*  --  184* 163* 137* 109* 109* 95  BUN 91*  --  93* 92* 95* 101* 108* 111*  CREATININE 3.40*  --  3.65* 3.93* 4.03* 4.26* 4.42* 4.63*  ALBUMIN  2.1* 2.1* 2.0* 3.0*  --  2.8* 2.6* 2.9*  CALCIUM  8.2*  --  8.5* 8.9 9.0 9.2 9.0 9.2  PHOS  --   --   --   --   --  6.3* 7.0* 7.6*  AST 16 16 14* 14*  --   --   --   --   ALT 22 20 19 15   --   --   --   --    Liver Function Tests: Recent Labs  Lab 07/03/23 1254 07/04/23 0554 07/05/23 0843 07/07/23 0931 07/08/23 0913 07/09/23 0608  AST 16 14* 14*  --   --   --   ALT 20 19 15   --   --   --   ALKPHOS 210* 168* 136*  --   --   --   BILITOT 2.1* 1.3* 1.5*  --   --   --   PROT 6.0* 5.7* 6.2*  --   --   --   ALBUMIN  2.1* 2.0* 3.0* 2.8* 2.6* 2.9*   No results for input(s): LIPASE, AMYLASE in the last 168 hours. Recent Labs  Lab 07/07/23 1549  AMMONIA 48*   CBC: Recent Labs  Lab 07/04/23 0554 07/05/23 0843 07/06/23 0823 07/07/23 0931 07/08/23 0913  WBC 10.4  11.0* 11.1* 11.7* 11.6*  NEUTROABS  --  8.5* 8.9* 8.9*  --   HGB 11.2* 10.6* 10.9* 12.0 12.0  HCT 34.2* 32.6* 33.2* 36.7 37.1  MCV 90.0 90.1 90.5 90.6 93.2  PLT 441* 404* 449* 441* 456*   Cardiac Enzymes: No results for input(s): CKTOTAL, CKMB, CKMBINDEX, TROPONINI in the last 168 hours. CBG: Recent Labs  Lab 07/08/23 1216 07/08/23 1631 07/08/23 2124 07/09/23 0740 07/09/23 1107  GLUCAP 116* 110* 95 101* 92    Iron Studies: No results for input(s): IRON, TIBC, TRANSFERRIN, FERRITIN in the last 72 hours. Studies/Results: DG CHEST PORT 1 VIEW Result Date: 07/08/2023 CLINICAL DATA:  Evaluate for pneumonia. EXAM: PORTABLE CHEST 1 VIEW COMPARISON:  07/04/2023 FINDINGS: Interval removal of left IJ catheter. Stable cardiac enlargement. Lung volumes are  low with asymmetric elevation of the left hemidiaphragm. Continued interval improvement in left perihilar and left lower lung opacities. No new findings. IMPRESSION: Continued interval improvement in left perihilar and left lower lung opacities. Cardiomegaly. Electronically Signed   By: Waddell Calk M.D.   On: 07/08/2023 07:04   CT ABDOMEN PELVIS WO CONTRAST Result Date: 07/07/2023 CLINICAL DATA:  Abdominal pain, acute, nonlocalized. EXAM: CT ABDOMEN AND PELVIS WITHOUT CONTRAST TECHNIQUE: Multidetector CT imaging of the abdomen and pelvis was performed following the standard protocol without IV contrast. RADIATION DOSE REDUCTION: This exam was performed according to the departmental dose-optimization program which includes automated exposure control, adjustment of the mA and/or kV according to patient size and/or use of iterative reconstruction technique. COMPARISON:  CT scan abdomen and pelvis from 07/01/2023. FINDINGS: Lower chest: There are patchy atelectatic changes in the visualized lung bases. No overt consolidation. No pleural effusion. The heart is mildly enlarged in size. No pericardial effusion. Hepatobiliary: The liver is  normal in size. Non-cirrhotic configuration. No suspicious mass. No intrahepatic or extrahepatic bile duct dilation. No calcified gallstones. Normal gallbladder wall thickness. No pericholecystic inflammatory changes. Pancreas: Unremarkable. No pancreatic ductal dilatation or surrounding inflammatory changes. Spleen: Within normal limits. No focal lesion. Adrenals/Urinary Tract: Adrenal glands are unremarkable. There are several simple cysts throughout bilateral kidneys with largest in the right kidney upper pole measuring up to 2.4 x 2.6 cm. No nephroureterolithiasis or obstructive uropathy. Urinary bladder is decompressed by Foley catheter, precluding optimal assessment. However, no large mass or stones identified. No perivesical fat stranding. Stomach/Bowel: No disproportionate dilation of the small or large bowel loops. No evidence of abnormal bowel wall thickening or inflammatory changes. The appendix is unremarkable. There are multiple diverticula throughout the colon, without imaging signs of diverticulitis. Vascular/Lymphatic: There is trace amount of ascites in the dependent pelvis. No walled-off abscess or loculated collection. No pneumoperitoneum. No abdominal or pelvic lymphadenopathy, by size criteria. No aneurysmal dilation of the major abdominal arteries. There are mild peripheral atherosclerotic vascular calcifications of the aorta and its major branches. Reproductive: The uterus is unremarkable. No large adnexal mass. Other: There is moderate anasarca. The soft tissues and abdominal wall are otherwise unremarkable. Musculoskeletal: No suspicious osseous lesions. There are mild multilevel degenerative changes in the visualized spine. IMPRESSION: 1. No acute inflammatory process identified within the abdomen or pelvis. 2. Multiple other nonacute observations, as described above. Aortic Atherosclerosis (ICD10-I70.0). Electronically Signed   By: Ree Molt M.D.   On: 07/07/2023 15:45    ascorbic  acid  500 mg Oral Daily   brimonidine   1 drop Both Eyes TID   Chlorhexidine  Gluconate Cloth  6 each Topical Daily   feeding supplement  237 mL Oral BID BM   guaiFENesin   600 mg Oral Q12H   heparin  injection (subcutaneous)  5,000 Units Subcutaneous Q8H   insulin  aspart  0-9 Units Subcutaneous TID WC   levothyroxine   125 mcg Oral Q0600   midodrine   15 mg Oral TID WC   multivitamin with minerals  1 tablet Oral Daily   mouth rinse  15 mL Mouth Rinse 4 times per day   polyethylene glycol  17 g Oral BID   pravastatin   40 mg Oral q1800   sodium zirconium cyclosilicate  10 g Oral Daily   timolol   1 drop Right Eye BID   zinc  sulfate (50mg  elemental zinc )  220 mg Oral Daily    BMET    Component Value Date/Time   NA 131 (L) 07/09/2023 9391  K 5.6 (H) 07/09/2023 0608   CL 96 (L) 07/09/2023 0608   CO2 19 (L) 07/09/2023 0608   GLUCOSE 95 07/09/2023 0608   BUN 111 (H) 07/09/2023 0608   CREATININE 4.63 (H) 07/09/2023 0608   CALCIUM  9.2 07/09/2023 0608   GFRNONAA 9 (L) 07/09/2023 0608   CBC    Component Value Date/Time   WBC 11.6 (H) 07/08/2023 0913   RBC 3.98 07/08/2023 0913   HGB 12.0 07/08/2023 0913   HCT 37.1 07/08/2023 0913   PLT 456 (H) 07/08/2023 0913   MCV 93.2 07/08/2023 0913   MCH 30.2 07/08/2023 0913   MCHC 32.3 07/08/2023 0913   RDW 17.6 (H) 07/08/2023 0913   LYMPHSABS 1.5 07/07/2023 0931   MONOABS 1.2 (H) 07/07/2023 0931   EOSABS 0.0 07/07/2023 0931   BASOSABS 0.0 07/07/2023 0931    Assessment/Plan: 78 year old BF- main issues seem to be obesity, lymphedema, chronic pain  and immobility and also chronic medical issues to include DM, HTN-  she now presents with crt of 4 1.Renal- She has another MR number and different records which have not been merged yet.  After review of those records, her baseline Scr had ranged 0.96-1.5 since 2021 but was 0.96-1.13 over the past year.  She presented to [facility] on [facility]/23 after a mechanical fall at home and her Scr was noted to be 3.1 at  that time.  She refused admission and left AMA.  She was taking telmisartan at that time.  She does have underlying CKD stage IIIa due to DM , HTN and kidneys that appear atrophic on imaging.  Presumably ischemic ATN in the setting of UTI, hypotension and concomitant ARB therapy.  Losartan has been held and she is on broad spectrum antibiotics.  She was given pressors and some fluid initially but off IVF's and levophed .  Her blood pressures remain to be variable.  Kidney function initially improved but now worsening again.  She is not a candidate for outpatient dialysis given her bed bound status and morbid obesity.  Her daughter is at the bedside and agrees not to pursue dialysis and Palliative care has been consulted and are arranging hospice disposition.  Nothing further to add.  Will sign off at this time.  Please call with any questions or concerns.   2. Hypertension/volume  - has chronic lymphedema.  Blood pressures are dropping despite 15 mg midodrine  tid so would not use diuretic.  IVF's as above.  May need transfer back to ICU for pressors. 3. UTI-   will recheck urine since has been on abx but grew lactobacillus.   4. Anemia  - has not been that remarkable which argues a little against longstanding CKD 5. Hyperkalemia - will start Lokelma and follow. 6. Rectus abdominis hematoma -  Hgb up to 12 today.  Continue to follow.  7. Parainfluenza 3 +  - droplet precaution, plan per primary.  8. Disposition - pt with super morbid obesity and is bed bound.  Not a candidate for long term dialysis.  Recommend palliative care to help set goals/limits of care.  Fairy RONAL Sellar, MD [employer] [phone]

## 2023-07-10 DIAGNOSIS — A419 Sepsis, unspecified organism: Secondary | ICD-10-CM | POA: Diagnosis not present

## 2023-07-10 DIAGNOSIS — I132 Hypertensive heart and chronic kidney disease with heart failure and with stage 5 chronic kidney disease, or end stage renal disease: Secondary | ICD-10-CM | POA: Diagnosis not present

## 2023-07-10 DIAGNOSIS — Z7189 Other specified counseling: Secondary | ICD-10-CM | POA: Diagnosis not present

## 2023-07-10 DIAGNOSIS — J189 Pneumonia, unspecified organism: Secondary | ICD-10-CM | POA: Diagnosis not present

## 2023-07-10 DIAGNOSIS — N289 Disorder of kidney and ureter, unspecified: Secondary | ICD-10-CM

## 2023-07-10 LAB — RENAL FUNCTION PANEL
Albumin: 2.6 g/dL — ABNORMAL LOW (ref 3.5–5.0)
Anion gap: 17 — ABNORMAL HIGH (ref 5–15)
BUN: 124 mg/dL — ABNORMAL HIGH (ref 8–23)
CO2: 18 mmol/L — ABNORMAL LOW (ref 22–32)
Calcium: 9.3 mg/dL (ref 8.9–10.3)
Chloride: 99 mmol/L (ref 98–111)
Creatinine, Ser: 4.83 mg/dL — ABNORMAL HIGH (ref 0.44–1.00)
GFR, Estimated: 9 mL/min — ABNORMAL LOW (ref >60)
Glucose, Bld: 102 mg/dL — ABNORMAL HIGH (ref 70–99)
Phosphorus: 7.8 mg/dL — ABNORMAL HIGH (ref 2.5–4.6)
Potassium: 5.8 mmol/L — ABNORMAL HIGH (ref 3.5–5.1)
Sodium: 134 mmol/L — ABNORMAL LOW (ref 135–145)

## 2023-07-10 LAB — GLUCOSE, CAPILLARY
Glucose-Capillary: 102 mg/dL — ABNORMAL HIGH (ref 70–99)
Glucose-Capillary: 124 mg/dL — ABNORMAL HIGH (ref 70–99)
Glucose-Capillary: 125 mg/dL — ABNORMAL HIGH (ref 70–99)
Glucose-Capillary: 113 mg/dL — ABNORMAL HIGH (ref 70–99)

## 2023-07-10 NOTE — Plan of Care (Signed)
  Problem: Education: Goal: Ability to describe self-care measures that may prevent or decrease complications (Diabetes Survival Skills Education) will improve Outcome: Progressing Goal: Individualized Educational Video(s) Outcome: Progressing   Problem: Coping: Goal: Ability to adjust to condition or change in health will improve Outcome: Progressing   Problem: Fluid Volume: Goal: Ability to maintain a balanced intake and output will improve Outcome: Progressing   Problem: Health Behavior/Discharge Planning: Goal: Ability to identify and utilize available resources and services will improve Outcome: Progressing Goal: Ability to manage health-related needs will improve Outcome: Progressing   Problem: Metabolic: Goal: Ability to maintain appropriate glucose levels will improve Outcome: Progressing   Problem: Nutritional: Goal: Maintenance of adequate nutrition will improve Outcome: Progressing Goal: Progress toward achieving an optimal weight will improve Outcome: Progressing   Problem: Skin Integrity: Goal: Risk for impaired skin integrity will decrease Outcome: Progressing   Problem: Education: Goal: Knowledge of General Education information will improve Description: Including pain rating scale, medication(s)/side effects and non-pharmacologic comfort measures Outcome: Progressing   Problem: Tissue Perfusion: Goal: Adequacy of tissue perfusion will improve Outcome: Progressing   Problem: Health Behavior/Discharge Planning: Goal: Ability to manage health-related needs will improve Outcome: Progressing   Problem: Clinical Measurements: Goal: Ability to maintain clinical measurements within normal limits will improve Outcome: Progressing Goal: Will remain free from infection Outcome: Progressing Goal: Diagnostic test results will improve Outcome: Progressing Goal: Respiratory complications will improve Outcome: Progressing Goal: Cardiovascular complication will  be avoided Outcome: Progressing   Problem: Activity: Goal: Risk for activity intolerance will decrease Outcome: Progressing   Problem: Nutrition: Goal: Adequate nutrition will be maintained Outcome: Progressing   Problem: Coping: Goal: Level of anxiety will decrease Outcome: Progressing   Problem: Elimination: Goal: Will not experience complications related to bowel motility Outcome: Progressing Goal: Will not experience complications related to urinary retention Outcome: Progressing   Problem: Pain Managment: Goal: General experience of comfort will improve and/or be controlled Outcome: Progressing   Problem: Safety: Goal: Ability to remain free from injury will improve Outcome: Progressing   Problem: Skin Integrity: Goal: Risk for impaired skin integrity will decrease Outcome: Progressing

## 2023-07-10 NOTE — Plan of Care (Signed)

## 2023-07-10 NOTE — Progress Notes (Signed)
 [facility] 4NP09 [facility]  Hospice hospital liaison note   Referral received from [facility] for family interest in [facility].     Chart currently under review, eligibility is pending at this time.    Unfortunately [facility] is unable to offer a bed today.   TOC aware that liaison will follow up tomorrow or sooner if a bed becomes available.    Thank you for the opportunity to participate in this patient's care.   Eleanor Nail, LPN Hospice nurse liaison [phone]

## 2023-07-10 NOTE — Progress Notes (Signed)
 PROGRESS NOTE  Mary [name] FMW:[id number] DOB: 04-Aug-1945   PCP: Patient, No Pcp Per  Patient is from: Home.  Bedbound at baseline.  DOA: 07/01/2023 LOS: 9  Chief complaints Chief Complaint  Patient presents with   Fall     Brief Narrative / Interim history: 78 year old F with PMH of DM-2 with retinopathy and CRVO, HFpEF, HTN, hypothyroidism, hyperlipidemia, prior substance abuse and morbid obesity presented to ED after she had a fall out of bed, unresponsive and could not breath, and admitted to ICU with septic shock in the setting of multifocal pneumonia.   Patient was hypotensive with SBP in 60s.  WBC 12.  Cr 4.3.  BUN 102.  Lactic acid 3.8.  CXR with cardiomegaly and some opacity.  CT head without acute finding.  CT chest with multifocal pneumonia, mucous plugging, hematoma and dried rectus abdominis.  Cultures obtained.  Started on broad-spectrum antibiotics and vasopressors..  TTE with LVEF of 35 to 40%, GH, G2 DD and severely dilated RA and LA and mildly reduced RV SF.   Patient came off vasopressor on 6/22 and transferred to [facility] hospitalist service on 6/23.  Patient was deemed to be not a candidate for dialysis.  Palliative medicine consulted and recommended continuing current care with focus on comfort.  TOC consulted for inpatient hospice.    Subjective: Seen and examined earlier this morning.  No major events overnight or this morning.  Patient is sleepy but wakes to voice.  Not a great historian.  Responds no to pain.  Objective: Vitals:   07/10/23 0253 07/10/23 0500 07/10/23 0746 07/10/23 1142  BP: 110/78  112/72 99/75  Pulse:   74 76  Resp:   12 12  Temp: 97.6 F (36.4 C)  (!) 97.3 F (36.3 C) 97.6 F (36.4 C)  TempSrc: Axillary  Axillary Oral  SpO2:   96% 100%  Weight:  (!) 136.9 kg    Height:        Examination:  GENERAL: No apparent distress.  Nontoxic. HEENT: MMM.  Vision and hearing grossly intact.  NECK: Supple.  No apparent JVD.  RESP:  No  IWOB.  Fair aeration bilaterally but limited exam due to body habitus. CVS:  RRR. Heart sounds normal.  ABD/GI/GU: BS+. Abd soft, NTND.  Foley catheter in place. MSK/EXT:  Moves extremities.  BLE lymphedema. SKIN: no apparent skin lesion or wound NEURO: Sleepy but wakes to voice.  Oriented to self.  Follows some commands.  No apparent focal neuro deficit but limited exam. PSYCH: Calm.  No distress or agitation.  Consultants:  Critical care admitted patient Nephrology Palliative medicine   Procedures: None  Microbiology summarized: 6/17-MRSA PCR screen reactive 6/17-blood cultures NGTD 6/17-RVP with parainfluenza virus 3 6/18-urine culture with lactobacillus species  Assessment and plan: Septic shock secondary to multifocal pneumonia with parainfluenza infection Suspected urinary tract infection -Completed antibiotic course on 6/18. - Completed stress dose steroid. -On room air.   Syncope unresponsiveness with elevated troponin Acute HFrEF: TTE with LVEF 35 to 40% (previously 55 to 60%), GH, G2 DD and severely dilated RA and LA and mildly reduced RV SF.  Does not appear fluid overloaded but difficult exam due to body habitus.  No respiratory distress.  Hypotension: Currently normotensive. -Continue p.o. 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  Evaluated by nephrology.  Evaluated by nephrology.  Not a candidate for outpatient HD.  Patient is bedbound.  Rectus abdominis hematoma: Repeat CT scan as above shows no bleeding in the subcutaneous tissues of abdomen   History of COPD: Stable. -Bronchodilators as needed   NIDDM-2: A1c 4.8%.  Does not seem to be on medications at home. Recent Labs  Lab 07/09/23 1107 07/09/23 1617 07/09/23 2152 07/10/23 0744 07/10/23 1138  GLUCAP 92 95 126* 102* 124*  - Continue SSI-sensitive   Hypothyroid -Continue Synthroid  125  daily   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 -Not a candidate for any systemic therapy   Bedbound state nonambulatory Goal of care-DNR with emphasis on comfort. -Appreciate help by palliative medicine -TOC consulted for inpatient hospice.   Morbid obesity Body mass index is 47.27 kg/m. Nutrition Problem: Increased nutrient needs Etiology: wound healing Signs/Symptoms: estimated needs Interventions: Ensure Enlive (each supplement provides 350kcal and 20 grams of protein), MVI   DVT prophylaxis:  heparin  injection 5,000 Units Start: 07/02/23 1530 SCDs Start: 07/01/23 1442  Code Status: DNR Family Communication: None at bedside Level of care: Med-Surg Status is: Inpatient Remains inpatient appropriate because: Safe disposition/inpatient hospice   Final disposition: Inpatient hospice   35 minutes with more than 50% spent in reviewing records, counseling patient/family and coordinating care.   Sch Meds:  Scheduled Meds:  ascorbic acid   500 mg Oral Daily   brimonidine   1 drop Both Eyes TID   Chlorhexidine  Gluconate Cloth  6 each Topical Daily   Gerhardt's butt cream   Topical Daily   guaiFENesin   600 mg Oral Q12H   heparin  injection (subcutaneous)  5,000 Units Subcutaneous Q8H   insulin  aspart  0-9 Units Subcutaneous TID WC   levothyroxine   125 mcg Oral Q0600   midodrine   15 mg Oral TID WC   mouth rinse  15 mL Mouth Rinse 4 times per day   polyethylene glycol  17 g Oral BID   pravastatin   40 mg Oral q1800   sodium zirconium cyclosilicate  10 g Oral Daily   timolol   1 drop Right Eye BID   zinc  sulfate (50mg  elemental zinc )  220 mg Oral Daily   Continuous Infusions: PRN Meds:.acetaminophen , artificial tears, docusate sodium , HYDROmorphone (DILAUDID) injection, ipratropium-albuterol, naLOXone (NARCAN)  injection, ondansetron  (ZOFRAN ) IV, mouth rinse, mouth rinse, polyethylene glycol, white petrolatum   Antimicrobials: Anti-infectives (From admission, onward)    Start      Dose/Rate Route Frequency Ordered Stop   07/04/23 2000  vancomycin  (VANCOCIN ) IVPB 1000 mg/200 mL premix        1,000 mg 200 mL/hr over 60 Minutes Intravenous Every 48 hours 07/04/23 1317 07/06/23 2248   07/03/23 0900  vancomycin  (VANCOREADY) IVPB 1500 mg/300 mL        1,500 mg 150 mL/hr over 120 Minutes Intravenous  Once 07/03/23 0812 07/03/23 1215   07/02/23 0000  piperacillin -tazobactam (ZOSYN ) IVPB 2.25 g        2.25 g 100 mL/hr over 30 Minutes Intravenous Every 8 hours 07/01/23 1524 07/07/23 2126   07/01/23 1524  vancomycin  variable dose per unstable renal function (pharmacist dosing)  Status:  Discontinued         Does not apply See admin instructions 07/01/23 1524 07/04/23 1317   07/01/23 1215  ceFEPIme  (MAXIPIME ) 2 g in sodium chloride  0.9 % 100 mL IVPB        2 g 200 mL/hr over 30 Minutes Intravenous  Once 07/01/23 1210 07/01/23 1305   07/01/23 1215  metroNIDAZOLE  (FLAGYL ) IVPB 500 mg        500 mg 100 mL/hr over 60 Minutes  Intravenous  Once 07/01/23 1210 07/01/23 1335   07/01/23 1215  vancomycin  (VANCOCIN ) IVPB 1000 mg/200 mL premix  Status:  Discontinued        1,000 mg 200 mL/hr over 60 Minutes Intravenous  Once 07/01/23 1210 07/01/23 1212   07/01/23 1215  vancomycin  (VANCOREADY) IVPB 2000 mg/400 mL        2,000 mg 200 mL/hr over 120 Minutes Intravenous  Once 07/01/23 1212 07/01/23 1448        I have personally reviewed the following labs and images: CBC: Recent Labs  Lab 07/04/23 0554 07/05/23 0843 07/06/23 0823 07/07/23 0931 07/08/23 0913  WBC 10.4 11.0* 11.1* 11.7* 11.6*  NEUTROABS  --  8.5* 8.9* 8.9*  --   HGB 11.2* 10.6* 10.9* 12.0 12.0  HCT 34.2* 32.6* 33.2* 36.7 37.1  MCV 90.0 90.1 90.5 90.6 93.2  PLT 441* 404* 449* 441* 456*   BMP &GFR Recent Labs  Lab 07/06/23 0823 07/07/23 0931 07/08/23 0913 07/09/23 0608 07/10/23 0952  NA 133* 132* 132* 131* 134*  K 4.7 5.3* 5.3* 5.6* 5.8*  CL 96* 95* 97* 96* 99  CO2 21* 18* 19* 19* 18*  GLUCOSE 137*  109* 109* 95 102*  BUN 95* 101* 108* 111* 124*  CREATININE 4.03* 4.26* 4.42* 4.63* 4.83*  CALCIUM  9.0 9.2 9.0 9.2 9.3  PHOS  --  6.3* 7.0* 7.6* 7.8*   Estimated Creatinine Clearance: 14.6 mL/min (A) (by C-G formula based on SCr of 4.83 mg/dL (H)). Liver & Pancreas: Recent Labs  Lab 07/04/23 0554 07/05/23 0843 07/07/23 0931 07/08/23 0913 07/09/23 0608 07/10/23 0952  AST 14* 14*  --   --   --   --   ALT 19 15  --   --   --   --   ALKPHOS 168* 136*  --   --   --   --   BILITOT 1.3* 1.5*  --   --   --   --   PROT 5.7* 6.2*  --   --   --   --   ALBUMIN  2.0* 3.0* 2.8* 2.6* 2.9* 2.6*   No results for input(s): LIPASE, AMYLASE in the last 168 hours. Recent Labs  Lab 07/07/23 1549  AMMONIA 48*   Diabetic: No results for input(s): HGBA1C in the last 72 hours. Recent Labs  Lab 07/09/23 1107 07/09/23 1617 07/09/23 2152 07/10/23 0744 07/10/23 1138  GLUCAP 92 95 126* 102* 124*   Cardiac Enzymes: No results for input(s): CKTOTAL, CKMB, CKMBINDEX, TROPONINI in the last 168 hours. No results for input(s): PROBNP in the last 8760 hours. Coagulation Profile: No results for input(s): INR, PROTIME in the last 168 hours. Thyroid Function Tests: No results for input(s): TSH, T4TOTAL, FREET4, T3FREE, THYROIDAB in the last 72 hours. Lipid Profile: No results for input(s): CHOL, HDL, LDLCALC, TRIG, CHOLHDL, LDLDIRECT in the last 72 hours. Anemia Panel: No results for input(s): VITAMINB12, FOLATE, FERRITIN, TIBC, IRON, RETICCTPCT in the last 72 hours. Urine analysis:    Component Value Date/Time   COLORURINE YELLOW 07/06/2023 1533   APPEARANCEUR TURBID (A) 07/06/2023 1533   LABSPEC 1.021 07/06/2023 1533   PHURINE 5.0 07/06/2023 1533   GLUCOSEU NEGATIVE 07/06/2023 1533   HGBUR SMALL (A) 07/06/2023 1533   BILIRUBINUR NEGATIVE 07/06/2023 1533   KETONESUR NEGATIVE 07/06/2023 1533   PROTEINUR 100 (A) 07/06/2023 1533   NITRITE  NEGATIVE 07/06/2023 1533   LEUKOCYTESUR MODERATE (A) 07/06/2023 1533   Sepsis Labs: Invalid input(s): PROCALCITONIN, LACTICIDVEN  Microbiology: Recent Results (  from the past 240 hours)  MRSA Next Gen by PCR, Nasal     Status: Abnormal   Collection Time: 07/01/23  2:42 PM   Specimen: Nasal Mucosa; Nasal Swab  Result Value Ref Range Status   MRSA by PCR Next Gen DETECTED (A) NOT DETECTED Final    Comment: RESULT CALLED TO, READ BACK BY AND VERIFIED WITH: RN B. LEFT [id number] @ 1819 FH (NOTE) The GeneXpert MRSA Assay (FDA approved for NASAL specimens only), is one component of a comprehensive MRSA colonization surveillance program. It is not intended to diagnose MRSA infection nor to guide or monitor treatment for MRSA infections. Test performance is not FDA approved in patients less than 47 years old. Performed at [facility], [address] [place], [place] [address]   Culture, blood (routine x 2)     Status: None   Collection Time: 07/01/23  5:45 PM   Specimen: BLOOD RIGHT HAND  Result Value Ref Range Status   Specimen Description BLOOD RIGHT HAND  Final   Special Requests   Final    BOTTLES DRAWN AEROBIC AND ANAEROBIC Blood Culture adequate volume   Culture   Final    NO GROWTH 5 DAYS Performed at [facility], [address], [place], [place] [address]    Report Status 07/06/2023 FINAL  Final  Culture, blood (routine x 2)     Status: None   Collection Time: 07/01/23  5:50 PM   Specimen: BLOOD RIGHT HAND  Result Value Ref Range Status   Specimen Description BLOOD RIGHT HAND  Final   Special Requests   Final    BOTTLES DRAWN AEROBIC AND ANAEROBIC Blood Culture results may not be optimal due to an inadequate volume of blood received in culture bottles   Culture   Final    NO GROWTH 5 DAYS Performed at [facility], [address], [place], [place] [address]    Report Status 07/06/2023 FINAL  Final  Respiratory (~20 pathogens) panel by PCR      Status: Abnormal   Collection Time: 07/02/23  3:35 AM   Specimen: Nasopharyngeal Swab; Respiratory  Result Value Ref Range Status   Adenovirus NOT DETECTED NOT DETECTED Final   Coronavirus 229E NOT DETECTED NOT DETECTED Final    Comment: (NOTE) The Coronavirus on the Respiratory Panel, DOES NOT test for the novel  Coronavirus (2019 nCoV)    Coronavirus HKU1 NOT DETECTED NOT DETECTED Final   Coronavirus NL63 NOT DETECTED NOT DETECTED Final   Coronavirus OC43 NOT DETECTED NOT DETECTED Final   Metapneumovirus NOT DETECTED NOT DETECTED Final   Rhinovirus / Enterovirus NOT DETECTED NOT DETECTED Final   Influenza A NOT DETECTED NOT DETECTED Final   Influenza B NOT DETECTED NOT DETECTED Final   Parainfluenza Virus 1 NOT DETECTED NOT DETECTED Final   Parainfluenza Virus 2 NOT DETECTED NOT DETECTED Final   Parainfluenza Virus 3 DETECTED (A) NOT DETECTED Final   Parainfluenza Virus 4 NOT DETECTED NOT DETECTED Final   Respiratory Syncytial Virus NOT DETECTED NOT DETECTED Final   Bordetella pertussis NOT DETECTED NOT DETECTED Final   Bordetella Parapertussis NOT DETECTED NOT DETECTED Final   Chlamydophila pneumoniae NOT DETECTED NOT DETECTED Final   Mycoplasma pneumoniae NOT DETECTED NOT DETECTED Final    Comment: Performed at [facility], [address] [place], [place] [address]  Urine Culture (for pregnant, neutropenic or urologic patients or patients with an indwelling urinary catheter)     Status:  Abnormal   Collection Time: 07/02/23  3:35 AM   Specimen: Urine, Catheterized  Result Value Ref Range Status   Specimen Description URINE, CATHETERIZED  Final   Special Requests NONE  Final   Culture (A)  Final    >=100,000 COLONIES/mL LACTOBACILLUS SPECIES Standardized susceptibility testing for this organism is not available. Performed at [facility], [address], [place], [place] [address]    Report Status 07/03/2023 FINAL  Final    Radiology Studies: No results  found.    Lyndel Sarate T. Shanoah Asbill [facility]  If 7PM-7AM, please contact night-coverage [email or web address] 07/10/2023, 1:55 PM

## 2023-07-10 NOTE — Progress Notes (Addendum)
 Daily Progress Note   Patient Name: Mary [name]       Date: 07/10/2023 DOB: [birth year]  Age: 78 y.o. MRN#: [id number] Attending Physician: [name] Mignon DASEN, MD Primary Care Physician: Patient, No Pcp Per Admit Date: 07/01/2023  Reason for Consultation/Follow-up: Establishing goals of care  Patient Profile/HPI:   78 y.o. female  with past medical history of  DM, HTN, lymphedema, chronic pain, immobility, HFpEF,  admitted on 07/01/2023 from assisted living with fall. Workup revealed septic shock. She was positive for parainfluenza. CT indicated multifocal pneumonia. Recovery has been complicated by persistent renal failure and not a candidate for dialysis. Palliative consulted for GOC.   Subjective: Chart reviewed including labs, progress notes, imaging from this and previous encounters.  Cr and BUN continue to trend up. Meal intake documented at 25 % No prn medications required over last 24 hours.  Son, Mary [name] at bedside. Answered his questions, gave medical update.   Review of Systems  Unable to perform ROS: Mental status change     Physical Exam Vitals and nursing note reviewed.   Cardiovascular:     Rate and Rhythm: Normal rate.   Musculoskeletal:     Comments: Diffuse anasarca   Neurological:     Mental Status: She is disoriented.             Vital Signs: BP 99/75 (BP Location: Right Wrist)   Pulse 76   Temp 97.6 F (36.4 C) (Oral)   Resp 12   Ht 5' 7 (1.702 m)   Wt (!) 136.9 kg   SpO2 100%   BMI 47.27 kg/m  SpO2: SpO2: 100 % O2 Device: O2 Device: Room Air O2 Flow Rate: O2 Flow Rate (L/min): 2 L/min  Intake/output summary:  Intake/Output Summary (Last 24 hours) at 07/10/2023 1407 Last data filed at 07/10/2023 1030 Gross per 24 hour  Intake 50 ml  Output 300 ml   Net -250 ml   LBM: Last BM Date : 07/09/23 Baseline Weight: Weight: (!) 167 kg Most recent weight: Weight: (!) 136.9 kg       Palliative Assessment/Data: PPS: 10%      Patient Active Problem List   Diagnosis Date Noted   Septic shock (HCC) 07/01/2023    Palliative Care Assessment & Plan    Assessment/Recommendations/Plan  Continue current interventions Continue  IV hydromorphone as needed for shortness of breath or discomfort Disposition issues- not eligible for inpatient hospice- will defer to [facility]  Code Status:   Code Status: Limited: Do not attempt resuscitation (DNR) -DNR-LIMITED -Do Not Intubate/DNI    Prognosis:  < 2 weeks  Discharge Planning: Hospice facility - pending availability and acceptance  Care plan was discussed with patient's daughter, Dr. [name], bedside RN  Thank you for allowing the Palliative Medicine Team to assist in the care of this patient.    Prolonged billing:  Time includes:   Preparing to see the patient (e.g., review of tests) Obtaining and/or reviewing separately obtained history Performing a medically necessary appropriate examination and/or evaluation Counseling and educating the patient/family/caregiver Ordering medications, tests, or procedures Referring and communicating with other health care professionals (when not reported separately) Documenting clinical information in the electronic or other health record Independently interpreting results (not reported separately) and communicating results to the patient/family/caregiver Care coordination (not reported separately) Clinical documentation  Mary [name], AGNP-C Palliative Medicine   Please contact Palliative Medicine Team phone at [phone] for questions and concerns.

## 2023-07-10 NOTE — Progress Notes (Addendum)
 Physical Therapy Treatment & Discharge Patient Details Name: Mary [name] MRN: [id number] DOB: 09-08-1945 Today's Date: 07/10/2023   History of Present Illness The pt is a 78 yo female presenting 6/17 after a ground-level fall followed by syncopal episode while in supine. Admitted for management of septic shock, hospital course complicated by continued need for pressor support. PMH includes: HTN, HFpEF, DM II, immobility, lymphedema, HTN retinopathy, HLD, chronic pain, and obesity (BMI 47.7).    PT Comments  Reviewed chart and it appears the plan is to d/c to a hospice facility with a poor prognosis, but to continue current interventions. Attempted to call pt's son and daughter using the numbers in pt's chart, but each call went directly to voicemail, so was unable to get an answer whether they would like therapy interventions to also continue at this time. Thus, focused session on performing ROM to her extremities as she could tolerate. Attempted to sit up long sitting in bed but pt expressed pain, thus ceased attempts at mobility. Pt continues to display deficits in bil knee flexion ROM. She actively moved her L ankle more than her R today.   Addendum  17:25 - Coordinated with Dr. Gonfa, who suggested therapy sign off at this time. Will sign off. Please re-consult if needed.     If plan is discharge home, recommend the following: Two people to help with walking and/or transfers;Two people to help with bathing/dressing/bathroom;Assistance with cooking/housework;Assistance with feeding;Direct supervision/assist for medications management;Direct supervision/assist for financial management;Assist for transportation;Help with stairs or ramp for entrance;Supervision due to cognitive status   Can travel by private vehicle     No  Equipment Recommendations  Wheelchair (measurements PT);Wheelchair cushion (measurements PT);Hospital bed;Hoyer lift (bariatric; air mattress)    Recommendations for  Other Services OT consult     Precautions / Restrictions Precautions Precautions: Fall Recall of Precautions/Restrictions: Impaired Precaution/Restrictions Comments: lymphedema, soft BP Restrictions Weight Bearing Restrictions Per Provider Order: No     Mobility  Bed Mobility Overal bed mobility: Needs Assistance Bed Mobility: Supine to Sit     Supine to sit: Total assist, HOB elevated     General bed mobility comments: Total assist needed to lift upper trunk off elevated HOB to try to sit long sitting in bed, with min activation noted by pt through her core to sit up. Deferred further attempts due to pt expressing pain with movement.    Transfers                   General transfer comment: would be dependent on lift    Ambulation/Gait               General Gait Details: not ambulatory at baseline   Stairs             Wheelchair Mobility     Tilt Bed    Modified Rankin (Stroke Patients Only)       Balance                                            Communication Communication Communication: Impaired Factors Affecting Communication: Difficulty expressing self  Cognition Arousal: Lethargic (would awaken briefly at times) Behavior During Therapy: Flat affect   PT - Cognitive impairments: No family/caregiver present to determine baseline, Attention, Awareness, Memory, Difficult to assess, Initiation Difficult to assess due to: Level of arousal  PT - Cognition Comments: no family present, pt with inconsistent attempt to answer questions, able to answer name briskly when asked, then continued to state her name when asked her birthday. Pt often stating yes or yes ma'am. Did not follow cues to move extremities with consistency, seemed to be more coincidental when she would or in response to tactile stimulation. Following commands: Impaired Following commands impaired: Follows one step commands  inconsistently, Follows one step commands with increased time    Cueing Cueing Techniques: Verbal cues, Gestural cues, Tactile cues, Visual cues  Exercises General Exercises - Upper Extremity Digit Composite Flexion: AAROM, Right, 10 reps, Supine Composite Extension: AAROM, Right, 10 reps, Supine General Exercises - Lower Extremity Ankle Circles/Pumps: AAROM, Both, 10 reps, Supine (moved L more than R actively) Quad Sets: PROM, Both, 10 reps, Supine Heel Slides: PROM, Both, 10 reps, Supine    General Comments General comments (skin integrity, edema, etc.): VSS      Pertinent Vitals/Pain Pain Assessment Pain Assessment: Faces Faces Pain Scale: Hurts little more Pain Location: bilateral knees with ROM, L more than RLE Pain Descriptors / Indicators: Discomfort, Grimacing, Guarding Pain Intervention(s): Monitored during session, Limited activity within patient's tolerance, Repositioned    Home Living                          Prior Function            PT Goals (current goals can now be found in the care plan section) Acute Rehab PT Goals Patient Stated Goal: none stated PT Goal Formulation: All assessment and education complete, DC therapy Time For Goal Achievement: 07/21/23 Potential to Achieve Goals: Fair Progress towards PT goals: Not progressing toward goals - comment (limited by pain and lethargy)    Frequency    Min 1X/week      PT Plan      Co-evaluation              AM-PAC PT 6 Clicks Mobility   Outcome Measure  Help needed turning from your back to your side while in a flat bed without using bedrails?: Total Help needed moving from lying on your back to sitting on the side of a flat bed without using bedrails?: Total Help needed moving to and from a bed to a chair (including a wheelchair)?: Total Help needed standing up from a chair using your arms (e.g., wheelchair or bedside chair)?: Total Help needed to walk in hospital room?:  Total Help needed climbing 3-5 steps with a railing? : Total 6 Click Score: 6    End of Session   Activity Tolerance: Patient limited by pain;Patient limited by lethargy Patient left: in bed;with call bell/phone within reach Nurse Communication: Mobility status PT Visit Diagnosis: Unsteadiness on feet (R26.81);Other abnormalities of gait and mobility (R26.89);Muscle weakness (generalized) (M62.81);History of falling (Z91.81);Pain Pain - Right/Left:  (bilateral) Pain - part of body: Leg     Time: 8346-8294 PT Time Calculation (min) (ACUTE ONLY): 12 min  Charges:    $Therapeutic Exercise: 8-22 mins PT General Charges $$ ACUTE PT VISIT: 1 Visit                     Theo Ferretti, PT, DPT Acute Rehabilitation Services  Office: [phone]    Theo CHRISTELLA Ferretti 07/10/2023, 5:26 PM

## 2023-07-10 NOTE — Progress Notes (Signed)
 Nutrition Brief Note  Chart reviewed. Per GOC meeting with palliative, family wanting to continue with current care with focus on comfort. Plan for discharge to inpatient hospice.  No further nutrition interventions planned at this time.  Please re-consult as needed.   Olivia Kenning, RD Registered Dietitian  See Amion for more information

## 2023-07-10 NOTE — Progress Notes (Shared)
 [facility] - Clinic Note  07/11/2023     CHIEF COMPLAINT Patient presents for No chief complaint on file.    HISTORY OF PRESENT ILLNESS: Mary [name] is a 78 y.o. female who presents to the clinic today for:     Patient states the vision is poor and she can not see.  Referring physician: No referring provider defined for this encounter.  HISTORICAL INFORMATION:   Selected notes from the MEDICAL RECORD NUMBER Transferred care from Dr. Alvia   CURRENT MEDICATIONS: Current Outpatient Medications (Ophthalmic Drugs)  Medication Sig   brimonidine  (ALPHAGAN ) 0.2 % ophthalmic solution INSTILL 1 DROP INTO BOTH EYES TWICE A DAY (Patient taking differently: Place 1 drop into both eyes 3 (three) times daily.)   dorzolamide -timolol  (COSOPT ) 22.3-6.8 MG/ML ophthalmic solution Place 1 drop into the right eye 2 (two) times daily. (Patient taking differently: Place 1 drop into both eyes 2 (two) times daily.)   timolol  (BETIMOL ) 0.25 % ophthalmic solution Place 1 drop into both eyes 2 (two) times daily.   No current facility-administered medications for this visit. (Ophthalmic Drugs)   Current Outpatient Medications (Other)  Medication Sig   amLODipine  (NORVASC ) 5 MG tablet Take 1 tablet (5 mg total) by mouth daily.   aspirin  81 MG chewable tablet Chew 81 mg by mouth daily.   Assure Comfort Lancets 28G MISC    benzonatate  (TESSALON ) 200 MG capsule Take 1 capsule (200 mg total) by mouth 3 (three) times daily.   Blood Glucose Monitoring Suppl (ONETOUCH VERIO FLEX SYSTEM) w/Device KIT Use as directed to check blood sugars 2 times per day dx: e11.65   cetirizine  (ZYRTEC ) 10 MG tablet Take 1 tablet (10 mg total) by mouth daily.   Cholecalciferol (VITAMIN D) 50 MCG (2000 UT) CAPS Take 2,000 Units by mouth daily at 6 (six) AM.   Emollient (BAG BALM) OINT Apply 1 Application topically 4 (four) times daily as needed (itching).   fluticasone  (FLONASE ) 50 MCG/ACT nasal  spray SPRAY 1 SPRAY INTO EACH NOSTRIL EVERY DAY (Patient taking differently: Place 1 spray into both nostrils daily.)   furosemide  (LASIX ) 40 MG tablet TAKE 1 TABLET BY MOUTH  DAILY   glucose blood (ONETOUCH VERIO) test strip Use as directed to check blood sugars 2 times per day dx: e11.65   HYDROcodone -acetaminophen  (NORCO) 10-325 MG tablet Take 1 tablet by mouth 5 (five) times daily as needed for moderate pain.   Ipratropium-Albuterol  (COMBIVENT ) 20-100 MCG/ACT AERS respimat Inhale 1 puff into the lungs 2 (two) times daily.   levothyroxine  (SYNTHROID ) 125 MCG tablet Take 125 mcg by mouth daily before breakfast.   loperamide (IMODIUM) 2 MG capsule Take 2 mg by mouth every 6 (six) hours as needed for diarrhea or loose stools.   metoprolol  succinate (TOPROL -XL) 50 MG 24 hr tablet TAKE 1 TABLET BY MOUTH  DAILY (Patient taking differently: Take 50 mg by mouth daily.)   montelukast  (SINGULAIR ) 10 MG tablet Take 10 mg by mouth at bedtime.   nystatin cream (MYCOSTATIN) Apply 1 Application topically 2 (two) times daily as needed for dry skin.   omeprazole  (PRILOSEC) 20 MG capsule TAKE 1 CAPSULE BY MOUTH  DAILY (Patient taking differently: Take 20 mg by mouth daily.)   OneTouch Delica Lancets 33G MISC Use as directed to check blood sugars 2 times per day dx: e11.65   pravastatin  (PRAVACHOL ) 40 MG tablet TAKE 1 TABLET BY MOUTH EVERY DAY (Patient taking differently: Take 80 mg by mouth daily.)  telmisartan  (MICARDIS ) 80 MG tablet TAKE 1 TABLET BY MOUTH  DAILY (Patient taking differently: Take 80 mg by mouth daily.)   triamcinolone  (KENALOG ) 0.1 % APPLY TO AFFECTED AREA TWICE A DAY (Patient taking differently: Apply 1 Application topically 2 (two) times daily as needed (rash).)   No current facility-administered medications for this visit. (Other)   REVIEW OF SYSTEMS:   ALLERGIES No Known Allergies  PAST MEDICAL HISTORY Past Medical History:  Diagnosis Date   Arthritis    osteoarthritis   Cataract     OS   Diabetes mellitus    Insulin  dependent   Diabetic retinopathy (HCC)    NPDR OU   Glaucoma    POAG OD   H/O cardiovascular stress test    pt. reports 10/13- was normal per pt.    Hyperlipemia    Hypertension    Hypertensive retinopathy    OU   Past Surgical History:  Procedure Laterality Date   CARPAL TUNNEL RELEASE Bilateral    CATARACT EXTRACTION Right 10/05/2018   Dr. Lelon   EYE SURGERY Right    Cat Sx   KNEE ARTHROSCOPY     right   QUADRICEPS TENDON REPAIR  02/17/2012   Procedure: REPAIR QUADRICEP TENDON;  Surgeon: Marcey Raman, MD;  Location: [facility] OR;  Service: Orthopedics;  Laterality: Right;  right quadriceps repair with latera release   QUADRICEPS TENDON REPAIR Right 05/15/2012   Procedure: REPAIR QUADRICEP TENDON;  Surgeon: Marcey Raman, MD;  Location: [facility] OR;  Service: Orthopedics;  Laterality: Right;   TOTAL KNEE ARTHROPLASTY  10/21/2011   Procedure: TOTAL KNEE ARTHROPLASTY;  Surgeon: Garnette JONETTA Raman, MD;  Location: [facility] OR;  Service: Orthopedics;  Laterality: Right;   TUBAL LIGATION     FAMILY HISTORY Family History  Problem Relation Age of Onset   Hypertension Mother    Heart Problems Father    Gout Father    Arthritis Father    SOCIAL HISTORY Social History   Tobacco Use   Smoking status: Former    Current packs/day: 0.00    Average packs/day: 1 pack/day for 47.5 years (47.5 ttl pk-yrs)    Types: Cigarettes    Start date: 01/15/1967    Quit date: 06/28/2014    Years since quitting: 9.0   Smokeless tobacco: Never  Vaping Use   Vaping status: Never Used  Substance Use Topics   Alcohol use: No   Drug use: Yes    Types: Hydrocodone        OPHTHALMIC EXAM: Not recorded    IMAGING AND PROCEDURES  Imaging and Procedures for @TODAY @          ASSESSMENT/PLAN:  No diagnosis found.   1. CRVO w/ CME OU  - pt of Dr. Alvia who wished to transfer care - OS long-standing low vision (CF) with subfoveal scar and central retinal atrophy -  OD with CME that was actively being managed with Ozurdex  OD ~q5 wks by Dr. Alvia (last on 8.20.20) s/p multiple IVTA and Ozurdex  OD - was due for Ozurdex  OD w/ Alvia on 9.25.20, but underwent cataract surgery OD w/ Lelon on 9.21.20 - s/p IVTA OD #4 (10.26.20), #5 (12.10.20), #6 (01.21.21), #7 (03.05.21), #8 (04.16.21), #9 (05.28.21), #10 (07.09.21), #11 (09.14.21), #12 (10.29.21), #13 (12.17.21), #14 (01.27.22), #15 (03.11.2022), #16 (05.04.22), #17 (06.17.22), #18 (07.29.22), #19 (09.16.22), #20 (10.28.22), #21 (12.16.22), #22 (02.03.23) #23 (04.07.23), #24 (06.14.23) ====================================== - s/p IVA OD #1 (07.17.23), #2 (08.14.23), #3 (09.14.23), #4 (10.18.23) #5 (12.01.24) ====================================== - s/p IVE OD #  1 (01.26.24), #2 (02.23.24), #3 (03.29.24), #4 (04.26.24), #5 (05.24.24) ====================================== -s/p IVV OD #1 (07.03.24), #2 (08.30.24), #3 (10.15.24), #4 (11.15.24), #5 (12.17.24), #6 (01.14.25), #7 (03.21.25) #8(05.16.25) ======================================= - s/p IVE HD sample OD #1 (04.18.2025)  - BCVA OD is stable at [facility] - exam shows mild improvement in blood stained vit condensations -- white and fading - OCT shows OD: Very hazy images, persistent IRF/IRHM , OS: Stable improvement in scattered cystic changes, persistent central ORA / SRHM at 4 wks - recommend IVV OD #9 today, 06.27.25, w/ f/u in 6 wks  - pt wishes to proceed with injection  - RBA of procedure discussed, questions answered - see procedure note -- AC tap performed post injxn - IVV informed consent obtained and signed, 07.03.24 (OD) - PACE approved Vabysmo  OD  - f/u 6 weeks -- DFE/OCT/possible injection  2,3. Severe Non-proliferative diabetic retinopathy OU.  - no OCT obtained today   - s/p IVTA OD on 06.14.23 as above  - s/p IVA OD #1 on 07.17.23 as above for DME as well as CRVO  - IVV OD today as above  4,5. Hypertensive retinopathy OU  - discussed  importance of tight BP control  - continue to monitor  6. 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 Pseudophakia OD  - s/p CE/IOL OD (9.21.20) by expert surgeon, Dr. Lelon  - IOL in excellent position  - had post op IOP spike -- IOP now under better control  - continue to monitor  8. POAG OD  - now under the expert management of Dr. Geneva (initial consult 07.27.23)  - s/p SLT and goniotomy OD w/ Dr. Lelon  - had post-cataract IOP spike  - on Brimonidine  TID OU and Timolol  BID OU -- no longer using Cosopt   - IOP 21, 22 today  No follow-ups on file.   Ophthalmic Meds Ordered this visit:  No orders of the defined types were placed in this encounter.   This document serves as a record of services personally performed by Redell JUDITHANN Hans, MD, PhD. It was created on their behalf by Delon Newness COT, an ophthalmic technician. The creation of this record is the provider's dictation and/or activities during the visit.    Electronically signed by: Delon Newness COT 06.26.25 8:25 AM    Abbreviations: M myopia (nearsighted); A astigmatism; H hyperopia (farsighted); P presbyopia; Mrx spectacle prescription;  CTL contact lenses; OD right eye; OS left eye; OU both eyes  XT exotropia; ET esotropia; PEK punctate epithelial keratitis; PEE punctate epithelial erosions; DES dry eye syndrome; MGD meibomian gland dysfunction; ATs artificial tears; PFAT's preservative free artificial tears; NSC nuclear sclerotic cataract; PSC posterior subcapsular cataract; ERM epi-retinal membrane; PVD posterior vitreous detachment; RD retinal detachment; DM diabetes mellitus; DR diabetic retinopathy; NPDR non-proliferative diabetic retinopathy; PDR proliferative diabetic retinopathy; CSME clinically significant macular edema; DME diabetic macular edema; dbh dot blot hemorrhages; CWS cotton wool spot; POAG primary open angle glaucoma; C/D cup-to-disc ratio; HVF  humphrey visual field; GVF goldmann visual field; OCT optical coherence tomography; IOP intraocular pressure; BRVO Branch retinal vein occlusion; CRVO central retinal vein occlusion; CRAO central retinal artery occlusion; BRAO branch retinal artery occlusion; RT retinal tear; SB scleral buckle; PPV pars plana vitrectomy; VH Vitreous hemorrhage; PRP panretinal laser photocoagulation; IVK intravitreal kenalog ; VMT vitreomacular traction; MH Macular hole;  NVD neovascularization of the disc; NVE neovascularization elsewhere; AREDS age related eye disease study; ARMD age related macular degeneration;  POAG primary open angle glaucoma; EBMD epithelial/anterior basement membrane dystrophy; ACIOL anterior chamber intraocular lens; IOL intraocular lens; PCIOL posterior chamber intraocular lens; Phaco/IOL phacoemulsification with intraocular lens placement; PRK photorefractive keratectomy; LASIK laser assisted in situ keratomileusis; HTN hypertension; DM diabetes mellitus; COPD chronic obstructive pulmonary disease

## 2023-07-11 ENCOUNTER — Encounter (INDEPENDENT_AMBULATORY_CARE_PROVIDER_SITE_OTHER): Payer: Medicare (Managed Care) | Admitting: Ophthalmology

## 2023-07-11 DIAGNOSIS — J189 Pneumonia, unspecified organism: Secondary | ICD-10-CM | POA: Diagnosis not present

## 2023-07-11 DIAGNOSIS — Z515 Encounter for palliative care: Secondary | ICD-10-CM

## 2023-07-11 DIAGNOSIS — I959 Hypotension, unspecified: Secondary | ICD-10-CM

## 2023-07-11 DIAGNOSIS — R0602 Shortness of breath: Secondary | ICD-10-CM

## 2023-07-11 DIAGNOSIS — L8990 Pressure ulcer of unspecified site, unspecified stage: Secondary | ICD-10-CM | POA: Insufficient documentation

## 2023-07-11 DIAGNOSIS — R6521 Severe sepsis with septic shock: Secondary | ICD-10-CM | POA: Diagnosis not present

## 2023-07-11 DIAGNOSIS — R627 Adult failure to thrive: Secondary | ICD-10-CM | POA: Insufficient documentation

## 2023-07-11 DIAGNOSIS — N179 Acute kidney failure, unspecified: Secondary | ICD-10-CM | POA: Insufficient documentation

## 2023-07-11 DIAGNOSIS — Z7189 Other specified counseling: Secondary | ICD-10-CM | POA: Diagnosis not present

## 2023-07-11 DIAGNOSIS — Z7401 Bed confinement status: Secondary | ICD-10-CM

## 2023-07-11 DIAGNOSIS — Z66 Do not resuscitate: Secondary | ICD-10-CM | POA: Insufficient documentation

## 2023-07-11 DIAGNOSIS — I890 Lymphedema, not elsewhere classified: Secondary | ICD-10-CM

## 2023-07-11 DIAGNOSIS — A419 Sepsis, unspecified organism: Secondary | ICD-10-CM | POA: Diagnosis not present

## 2023-07-11 LAB — RENAL FUNCTION PANEL
Albumin: 2.8 g/dL — ABNORMAL LOW (ref 3.5–5.0)
Anion gap: 16 — ABNORMAL HIGH (ref 5–15)
BUN: 128 mg/dL — ABNORMAL HIGH (ref 8–23)
CO2: 19 mmol/L — ABNORMAL LOW (ref 22–32)
Calcium: 9.1 mg/dL (ref 8.9–10.3)
Chloride: 98 mmol/L (ref 98–111)
Creatinine, Ser: 4.72 mg/dL — ABNORMAL HIGH (ref 0.44–1.00)
GFR, Estimated: 9 mL/min — ABNORMAL LOW (ref >60)
Glucose, Bld: 94 mg/dL (ref 70–99)
Phosphorus: 7.5 mg/dL — ABNORMAL HIGH (ref 2.5–4.6)
Potassium: 5.3 mmol/L — ABNORMAL HIGH (ref 3.5–5.1)
Sodium: 133 mmol/L — ABNORMAL LOW (ref 135–145)

## 2023-07-11 LAB — GLUCOSE, CAPILLARY
Glucose-Capillary: 81 mg/dL (ref 70–99)
Glucose-Capillary: 95 mg/dL (ref 70–99)
Glucose-Capillary: 84 mg/dL (ref 70–99)

## 2023-07-11 MED ORDER — ONDANSETRON HCL 4 MG/2ML IJ SOLN
4.0000 mg | Freq: Four times a day (QID) | INTRAMUSCULAR | Status: DC | PRN
Start: 2023-07-11 — End: 2023-09-17

## 2023-07-11 MED ORDER — HYDROMORPHONE HCL 1 MG/ML IJ SOLN: 0.2500 mg | INTRAMUSCULAR | Status: DC | PRN

## 2023-07-11 MED ORDER — ACETAMINOPHEN 325 MG PO TABS: 650.0000 mg | ORAL_TABLET | Freq: Four times a day (QID) | ORAL | Status: DC | PRN

## 2023-07-11 MED ORDER — MIDODRINE HCL 5 MG PO TABS
15.0000 mg | ORAL_TABLET | Freq: Three times a day (TID) | ORAL | Status: DC
Start: 2023-07-11 — End: 2023-09-17

## 2023-07-11 MED ORDER — GLYCOPYRROLATE 1 MG/5ML PO SOLN: 1.0000 mg | Freq: Four times a day (QID) | ORAL | Status: AC | PRN

## 2023-07-11 NOTE — Progress Notes (Signed)
 Patient was discharged to hospice via [facility]. Discharge paper work printed, placed in discharge packet and was handed to EMT.

## 2023-07-11 NOTE — Plan of Care (Signed)

## 2023-07-11 NOTE — Discharge Summary (Signed)
 Physician Discharge Summary  Mary [name] FMW:[id number] DOB: 02-15-1945 DOA: 07/01/2023  PCP: Patient, No Pcp Per  Admit date: 07/01/2023 Discharge date: 07/11/23  Admitted From: Home Disposition: Residential hospice for end-of-life care   Discharge Condition: Stable but guarded prognosis CODE STATUS: DNR   Hospital course 78 year old F with PMH of DM-2 with retinopathy and CRVO, HFpEF, HTN, hypothyroidism, hyperlipidemia, prior substance abuse and morbid obesity presented to ED after she had a fall out of bed, unresponsive and could not breath, and admitted to ICU with septic shock in the setting of multifocal pneumonia.    Patient was hypotensive with SBP in 60s.  WBC 12.  Cr 4.3.  BUN 102.  Lactic acid 3.8.  CXR with cardiomegaly and some opacity.  CT head without acute finding.  CT chest with multifocal pneumonia, mucous plugging, hematoma and dried rectus abdominis.  Cultures obtained.  Started on broad-spectrum antibiotics and vasopressors..   TTE with LVEF of 35 to 40%, GH, G2 DD and severely dilated RA and LA and mildly reduced RV SF.    Patient came off vasopressor on 6/22 and transferred to [facility] hospitalist service on 6/23.  Patient was deemed to be not a candidate for dialysis.  Palliative medicine consulted and recommended continuing current care with focus on comfort.  Patient has been accepted to residential hospice in [place].   See individual problem list below for more.   Problems addressed during this hospitalization Septic shock secondary to multifocal pneumonia with parainfluenza infection Suspected urinary tract infection -Completed antibiotic course on 6/18. - Completed stress dose steroid. -On room air.   Syncope unresponsiveness with elevated troponin Acute HFrEF: TTE with LVEF 35 to 40% (previously 55 to 60%), GH, G2 DD and severely dilated RA and LA and mildly reduced RV SF.  Does not appear fluid overloaded but difficult exam due to body  habitus.  No respiratory distress.   Hypotension: Currently normotensive. -Continue p.o. 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  Evaluated by nephrology.  Evaluated by nephrology.  Not a candidate for outpatient HD.  Patient is bedbound.     Rectus abdominis hematoma: Repeat CT scan as above shows no bleeding in the subcutaneous tissues of abdomen   History of COPD: Stable. -Bronchodilators as needed   NIDDM-2: A1c 4.8%.  Does not seem to be on medications at home.  Hypothyroid -Continue Synthroid  125 daily   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 -Not a candidate for any systemic therapy  Failure to thrive Bedbound state nonambulatory Goal of care-DNR with emphasis on comfort. -Appreciate help by palliative medicine - Accepted to residential hospice in [facility] for end-of-life care   Increased nutrient needs Body mass index is 48.86 kg/m. Nutrition Problem: Increased nutrient needs Etiology: wound healing Signs/Symptoms: estimated needs Interventions: Ensure Enlive (each supplement provides 350kcal and 20 grams of protein), MVI     Consultations: Critical care admitted patient Nephrology Palliative medicine  Time spent 35  minutes  Vital signs Vitals:   07/11/23 0307 07/11/23 0725 07/11/23 0749 07/11/23 1156  BP: 106/61  104/66 104/71  Pulse: 73  78 72  Temp: (!) 97.5 F (36.4 C)  (!) 96.4 F (35.8 C) 97.7 F (36.5 C)  Resp: 13  11 14   Height:      Weight:  (!) 141.5 kg    SpO2: 100%  100% 100%  TempSrc: Axillary  Axillary Axillary  BMI (Calculated):  48.85  Discharge exam  GENERAL: No apparent distress.  Somewhat lethargic. HEENT: MMM.  Vision and hearing grossly intact.  NECK: Supple.  No apparent JVD.  RESP: On room air.  No IWOB.  CVS:  RRR. Heart sounds normal.  ABD/GI/GU: BS+. Abd soft, NTND.  MSK/EXT: Lymphedema. NEURO: Awake and  oriented to self.  No apparent focal neuro deficit but limited exam PSYCH: Calm.  No distress or agitation.  Discharge Instructions  Allergies as of 07/11/2023   No Known Allergies      Medication List     STOP taking these medications    aspirin EC 81 MG tablet   bacitracin 500 UNIT/GM ointment   bag balm Oint ointment   cetirizine 10 MG tablet Commonly known as: ZYRTEC   Cholecalciferol 50 MCG (2000 UT) Tabs   fluticasone 50 MCG/ACT nasal spray Commonly known as: FLONASE   HYDROcodone-acetaminophen  10-325 MG tablet Commonly known as: NORCO   loperamide 2 MG capsule Commonly known as: IMODIUM   losartan 100 MG tablet Commonly known as: COZAAR   magnesium hydroxide 400 MG/5ML suspension Commonly known as: MILK OF MAGNESIA   metoprolol succinate 50 MG 24 hr tablet Commonly known as: TOPROL-XL   miconazole 2 % cream Commonly known as: MICOTIN   nystatin powder   ondansetron  4 MG tablet Commonly known as: ZOFRAN  Replaced by: ondansetron  4 MG/2ML Soln injection   pravastatin  40 MG tablet Commonly known as: PRAVACHOL    Pro-Stat Liqd   saccharomyces boulardii 250 MG capsule Commonly known as: FLORASTOR   Torsemide 40 MG Tabs       TAKE these medications    acetaminophen  325 MG tablet Commonly known as: TYLENOL  Take 2 tablets (650 mg total) by mouth every 6 (six) hours as needed for mild pain (pain score 1-3).   brimonidine  0.2 % ophthalmic solution Commonly known as: ALPHAGAN  Place 1 drop into both eyes 3 (three) times daily.   Combivent Respimat 20-100 MCG/ACT Aers respimat Generic drug: Ipratropium-Albuterol  Inhale 1 puff into the lungs every 6 (six) hours.   Glycopyrrolate 1 MG/5ML Soln Take 5 mLs (1 mg total) by mouth 4 (four) times daily as needed for up to 3 days.   HYDROmorphone  1 MG/ML injection Commonly known as: DILAUDID  Inject 0.25 mLs (0.25 mg total) into the vein every 2 (two) hours as needed (SOB, increased RR).    levothyroxine  125 MCG tablet Commonly known as: SYNTHROID  Take 125 mcg by mouth daily before breakfast.   midodrine  5 MG tablet Commonly known as: PROAMATINE  Take 3 tablets (15 mg total) by mouth 3 (three) times daily with meals.   ondansetron  4 MG/2ML Soln injection Commonly known as: ZOFRAN  Inject 2 mLs (4 mg total) into the vein every 6 (six) hours as needed for nausea or vomiting. Replaces: ondansetron  4 MG tablet   Systane 0.4-0.3 % Soln Generic drug: Polyethyl Glycol-Propyl Glycol Place 1 drop into both eyes every 12 (twelve) hours as needed (dry eyes).   timolol  0.5 % ophthalmic solution Commonly known as: BETIMOL  Place 1 drop into the right eye 2 (two) times daily.         Procedures/Studies: None   DG CHEST PORT 1 VIEW Result Date: 07/08/2023 CLINICAL DATA:  Evaluate for pneumonia. EXAM: PORTABLE CHEST 1 VIEW COMPARISON:  07/04/2023 FINDINGS: Interval removal of left IJ catheter. Stable cardiac enlargement. Lung volumes are low with asymmetric elevation of the left hemidiaphragm. Continued interval improvement in left perihilar and left lower lung opacities. No new findings. IMPRESSION: Continued interval improvement  in left perihilar and left lower lung opacities. Cardiomegaly. Electronically Signed   By: Waddell Calk M.D.   On: 07/08/2023 07:04   CT ABDOMEN PELVIS WO CONTRAST Result Date: 07/07/2023 CLINICAL DATA:  Abdominal pain, acute, nonlocalized. EXAM: CT ABDOMEN AND PELVIS WITHOUT CONTRAST TECHNIQUE: Multidetector CT imaging of the abdomen and pelvis was performed following the standard protocol without IV contrast. RADIATION DOSE REDUCTION: This exam was performed according to the departmental dose-optimization program which includes automated exposure control, adjustment of the mA and/or kV according to patient size and/or use of iterative reconstruction technique. COMPARISON:  CT scan abdomen and pelvis from 07/01/2023. FINDINGS: Lower chest: There are patchy  atelectatic changes in the visualized lung bases. No overt consolidation. No pleural effusion. The heart is mildly enlarged in size. No pericardial effusion. Hepatobiliary: The liver is normal in size. Non-cirrhotic configuration. No suspicious mass. No intrahepatic or extrahepatic bile duct dilation. No calcified gallstones. Normal gallbladder wall thickness. No pericholecystic inflammatory changes. Pancreas: Unremarkable. No pancreatic ductal dilatation or surrounding inflammatory changes. Spleen: Within normal limits. No focal lesion. Adrenals/Urinary Tract: Adrenal glands are unremarkable. There are several simple cysts throughout bilateral kidneys with largest in the right kidney upper pole measuring up to 2.4 x 2.6 cm. No nephroureterolithiasis or obstructive uropathy. Urinary bladder is decompressed by Foley catheter, precluding optimal assessment. However, no large mass or stones identified. No perivesical fat stranding. Stomach/Bowel: No disproportionate dilation of the small or large bowel loops. No evidence of abnormal bowel wall thickening or inflammatory changes. The appendix is unremarkable. There are multiple diverticula throughout the colon, without imaging signs of diverticulitis. Vascular/Lymphatic: There is trace amount of ascites in the dependent pelvis. No walled-off abscess or loculated collection. No pneumoperitoneum. No abdominal or pelvic lymphadenopathy, by size criteria. No aneurysmal dilation of the major abdominal arteries. There are mild peripheral atherosclerotic vascular calcifications of the aorta and its major branches. Reproductive: The uterus is unremarkable. No large adnexal mass. Other: There is moderate anasarca. The soft tissues and abdominal wall are otherwise unremarkable. Musculoskeletal: No suspicious osseous lesions. There are mild multilevel degenerative changes in the visualized spine. IMPRESSION: 1. No acute inflammatory process identified within the abdomen or pelvis.  2. Multiple other nonacute observations, as described above. Aortic Atherosclerosis (ICD10-I70.0). Electronically Signed   By: Ree Molt M.D.   On: 07/07/2023 15:45   DG Chest Port 1 View Result Date: 07/04/2023 CLINICAL DATA:  History of fall and septic shock.  Pneumonia EXAM: PORTABLE CHEST 1 VIEW COMPARISON:  Yesterday FINDINGS: Left IJ central line tip at superior caval/atrial junction. Patient rotated right. Moderate cardiomegaly. Mild left hemidiaphragm elevation. No pleural effusion or pneumothorax. Pulmonary interstitial prominence and indistinctness, decreased. Perihilar airspace disease is greater left than right, also improved. IMPRESSION: Cardiomegaly with improved interstitial thickening and perihilar airspace disease. Pulmonary edema and/or atypical infection. Electronically Signed   By: Rockey Kilts M.D.   On: 07/04/2023 10:12   VAS US  LOWER EXTREMITY VENOUS (DVT) Result Date: 07/03/2023  Lower Venous DVT Study Patient Name:  Mary [name]  Date of Exam:   07/03/2023 Medical Rec #: [id number]         Accession #:    [id number] Date of Birth: [birth year]         Patient Gender: F Patient Age:   88 years Exam Location:  [facility] Procedure:      VAS US  LOWER EXTREMITY VENOUS (DVT) Referring Phys: MAUDE BANNER --------------------------------------------------------------------------------  Indications: Pain.  Risk Factors: None identified. Limitations:  Body habitus and poor ultrasound/tissue interface. Comparison Study: No prior studies. Performing Technologist: Cordella Collet RVT  Examination Guidelines: A complete evaluation includes B-mode imaging, spectral Doppler, color Doppler, and power Doppler as needed of all accessible portions of each vessel. Bilateral testing is considered an integral part of a complete examination. Limited examinations for reoccurring indications may be performed as noted. The reflux portion of the exam is performed with the patient in reverse  Trendelenburg.  +-----+---------------+---------+-----------+----------+--------------+ RIGHTCompressibilityPhasicitySpontaneityPropertiesThrombus Aging +-----+---------------+---------+-----------+----------+--------------+ CFV  Full           Yes      Yes                                 +-----+---------------+---------+-----------+----------+--------------+   +---------+---------------+---------+-----------+----------+-------------------+ LEFT     CompressibilityPhasicitySpontaneityPropertiesThrombus Aging      +---------+---------------+---------+-----------+----------+-------------------+ CFV      Full           Yes      Yes                                      +---------+---------------+---------+-----------+----------+-------------------+ SFJ      Full                                                             +---------+---------------+---------+-----------+----------+-------------------+ FV Prox  Full                                                             +---------+---------------+---------+-----------+----------+-------------------+ FV Mid                  Yes      Yes                                      +---------+---------------+---------+-----------+----------+-------------------+ FV Distal                                             Not well visualized +---------+---------------+---------+-----------+----------+-------------------+ PFV      Full                                                             +---------+---------------+---------+-----------+----------+-------------------+ POP      Full           Yes      Yes                                      +---------+---------------+---------+-----------+----------+-------------------+ PTV  Not well visualized +---------+---------------+---------+-----------+----------+-------------------+ PERO                                                   Not well visualized +---------+---------------+---------+-----------+----------+-------------------+     Summary: RIGHT: - No evidence of common femoral vein obstruction.   LEFT: - There is no evidence of deep vein thrombosis in the lower extremity. However, portions of this examination were limited- see technologist comments above.  - No cystic structure found in the popliteal fossa.  *See table(s) above for measurements and observations. Electronically signed by Debby Robertson on 07/03/2023 at 5:45:59 PM.    Final    DG Chest Port 1 View Result Date: 07/03/2023 CLINICAL DATA:  Central line placement EXAM: PORTABLE CHEST 1 VIEW COMPARISON:  07/03/2023 FINDINGS: Single frontal view of the chest demonstrates left internal jugular catheter tip overlying the atriocaval junction. The cardiac silhouette remains enlarged. Chronic elevation of the left hemidiaphragm. Persistent pulmonary vascular congestion, with patchy perihilar airspace disease left greater than right. No effusion or pneumothorax. IMPRESSION: 1. No complication after left internal jugular catheter placement. 2. Vascular congestion, with bilateral perihilar airspace disease, left greater than right, not appreciably changed since prior study. Findings could reflect edema or infection. Electronically Signed   By: Ozell Daring M.D.   On: 07/03/2023 14:27   DG Chest Port 1 View Result Date: 07/03/2023 CLINICAL DATA:  Pneumonia EXAM: PORTABLE CHEST 1 VIEW COMPARISON:  July 01, 2023 FINDINGS: Persistent left lung infiltrates and atelectasis with lingular infiltrates correlate with pneumonic infiltrates with interval improvement in aeration of the right lung. Heart and mediastinum normal Without pleural effusions or pneumothorax IMPRESSION: Persistent left lung infiltrates and atelectasis with interval improvement in aeration of the right lung. Electronically Signed   By: Franky Chard M.D.   On: 07/03/2023 07:12    ECHOCARDIOGRAM COMPLETE Result Date: 07/02/2023    ECHOCARDIOGRAM REPORT   Patient Name:   Mary [name] Date of Exam: 07/02/2023 Medical Rec #:  [id number]        Height:       67.0 in Accession #:    [id number]       Weight:       283.3 lb Date of Birth:  1945/09/28        BSA:          2.345 m Patient Age:    77 years         BP:           117/74 mmHg Patient Gender: F                HR:           98 bpm. Exam Location:  Inpatient Procedure: 2D Echo, Color Doppler and Cardiac Doppler (Both Spectral and Color            Flow Doppler were utilized during procedure). Indications:    Shock (HCC)  History:        Patient has no prior history of Echocardiogram examinations.  Sonographer:    Benard Stallion Referring Phys: [phone] PAULA B SIMPSON IMPRESSIONS  1. Left ventricular ejection fraction, by estimation, is 35 to 40%. Left ventricular ejection fraction by 2D MOD biplane is 40.1 %. The left ventricle has moderately decreased function. The left ventricle demonstrates global hypokinesis. There is moderate left ventricular hypertrophy. Left  ventricular diastolic parameters are consistent with Grade II diastolic dysfunction (pseudonormalization). Elevated left ventricular end-diastolic pressure.  2. Right ventricular systolic function is mildly reduced. The right ventricular size is normal. There is moderately elevated pulmonary artery systolic pressure. The estimated right ventricular systolic pressure is 58.8 mmHg.  3. Left atrial size was severely dilated.  4. Right atrial size was severely dilated.  5. The mitral valve is degenerative. Mild mitral valve regurgitation.  6. The aortic valve is tricuspid. Aortic valve regurgitation is trivial. Aortic valve sclerosis/calcification is present, without any evidence of aortic stenosis. Aortic valve mean gradient measures 8.0 mmHg.  7. The inferior vena cava is dilated in size with <50% respiratory variability, suggesting right atrial pressure of 15 mmHg.  Comparison(s): No prior Echocardiogram. FINDINGS  Left Ventricle: Left ventricular ejection fraction, by estimation, is 35 to 40%. Left ventricular ejection fraction by 2D MOD biplane is 40.1 %. The left ventricle has moderately decreased function. The left ventricle demonstrates global hypokinesis. The left ventricular internal cavity size was normal in size. There is moderate left ventricular hypertrophy. Left ventricular diastolic parameters are consistent with Grade II diastolic dysfunction (pseudonormalization). Elevated left ventricular end-diastolic pressure. Right Ventricle: The right ventricular size is normal. No increase in right ventricular wall thickness. Right ventricular systolic function is mildly reduced. There is moderately elevated pulmonary artery systolic pressure. The tricuspid regurgitant velocity is 3.31 m/s, and with an assumed right atrial pressure of 15 mmHg, the estimated right ventricular systolic pressure is 58.8 mmHg. Left Atrium: Left atrial size was severely dilated. Right Atrium: Right atrial size was severely dilated. Pericardium: There is no evidence of pericardial effusion. Mitral Valve: The mitral valve is degenerative in appearance. Mild mitral valve regurgitation, with centrally-directed jet. Tricuspid Valve: The tricuspid valve is grossly normal. Tricuspid valve regurgitation is mild. Aortic Valve: The aortic valve is tricuspid. Aortic valve regurgitation is trivial. Aortic regurgitation PHT measures 321 msec. Aortic valve sclerosis/calcification is present, without any evidence of aortic stenosis. Aortic valve mean gradient measures 8.0 mmHg. Aortic valve peak gradient measures 14.7 mmHg. Aortic valve area, by VTI measures 1.62 cm. Pulmonic Valve: The pulmonic valve was grossly normal. Pulmonic valve regurgitation is trivial. Aorta: The aortic root and ascending aorta are structurally normal, with no evidence of dilitation. Venous: The inferior vena cava is dilated in size  with less than 50% respiratory variability, suggesting right atrial pressure of 15 mmHg. IAS/Shunts: No atrial level shunt detected by color flow Doppler.  LEFT VENTRICLE PLAX 2D                        Biplane EF (MOD) LVIDd:         3.70 cm         LV Biplane EF:   Left LVIDs:         3.10 cm                          ventricular LV PW:         1.50 cm                          ejection LV IVS:        1.50 cm                          fraction by LVOT diam:     2.10 cm  2D MOD LV SV:         44                               biplane is LV SV Index:   19                               40.1 %. LVOT Area:     3.46 cm                                Diastology                                LV e' medial:    4.24 cm/s LV Volumes (MOD)               LV E/e' medial:  22.7 LV vol d, MOD    76.4 ml       LV e' lateral:   5.33 cm/s A2C:                           LV E/e' lateral: 18.0 LV vol d, MOD    77.1 ml A4C: LV vol s, MOD    53.4 ml A2C: LV vol s, MOD    39.4 ml A4C: LV SV MOD A2C:   23.0 ml LV SV MOD A4C:   77.1 ml LV SV MOD BP:    30.5 ml RIGHT VENTRICLE RV Basal diam:  4.50 cm RV Mid diam:    3.70 cm TAPSE (M-mode): 1.3 cm LEFT ATRIUM              Index        RIGHT ATRIUM           Index LA diam:        4.60 cm  1.96 cm/m   RA Area:     31.00 cm LA Vol (A2C):   120.0 ml 51.18 ml/m  RA Volume:   111.00 ml 47.34 ml/m LA Vol (A4C):   110.0 ml 46.91 ml/m LA Biplane Vol: 116.0 ml 49.47 ml/m  AORTIC VALVE                     PULMONIC VALVE AV Area (Vmax):    1.70 cm      PR End Diast Vel: 8.47 msec AV Area (Vmean):   1.54 cm AV Area (VTI):     1.62 cm AV Vmax:           192.00 cm/s AV Vmean:          128.000 cm/s AV VTI:            0.272 m AV Peak Grad:      14.7 mmHg AV Mean Grad:      8.0 mmHg LVOT Vmax:         94.00 cm/s LVOT Vmean:        56.800 cm/s LVOT VTI:          0.127 m LVOT/AV VTI ratio: 0.47 AI PHT:            321 msec  AORTA Ao Root diam: 3.00 cm Ao Asc diam:  3.10 cm MITRAL  VALVE  TRICUSPID VALVE MV Area (PHT): 4.68 cm    TR Peak grad:   43.8 mmHg MV Decel Time: 162 msec    TR Vmax:        331.00 cm/s MR Peak grad: 82.8 mmHg MR Vmax:      455.00 cm/s  SHUNTS MV E velocity: 96.10 cm/s  Systemic VTI:  0.13 m MV A velocity: 45.30 cm/s  Systemic Diam: 2.10 cm MV E/A ratio:  2.12 Vinie Maxcy MD Electronically signed by Vinie Maxcy MD Signature Date/Time: 07/02/2023/12:45:51 PM    Final    DG Pelvis Portable Result Date: 07/01/2023 CLINICAL DATA:  Pain after fall EXAM: PORTABLE PELVIS 1 VIEWS COMPARISON:  None Available. FINDINGS: Osteopenia. Hyperostosis. Dystrophic area ossification along the area of the left iliopsoas. No acute fracture or dislocation. Mild degenerative changes with joint space loss. Overlapping cardiac leads. Films are under penetrated. IMPRESSION: Chronic changes.  Osteopenia.  Degenerative changes. Electronically Signed   By: Ranell Bring M.D.   On: 07/01/2023 13:39   DG Chest Port 1 View Result Date: 07/01/2023 CLINICAL DATA:  Pain after fall EXAM: PORTABLE CHEST 1 VIEW COMPARISON:  X-ray 04/19/2023 and older FINDINGS: Enlarged cardiopericardial silhouette. Widened mediastinum with tortuous ectatic aorta, similar to previous. Diffuse interstitial lung changes identified. Subtle areas of opacity suggested. No pneumothorax or effusion. Osteopenia. Degenerative changes. Overlapping cardiac leads. Please see separate CT scan of the chest abdomen and pelvis. IMPRESSION: Enlarged heart with tortuous ectatic aorta. Interstitial lung changes with some subtle lung opacities. Electronically Signed   By: Ranell Bring M.D.   On: 07/01/2023 13:36   CT CHEST ABDOMEN PELVIS W CONTRAST Result Date: 07/01/2023 CLINICAL DATA:  Pain.  Poly trauma.  Fall.  Initially hypotensive EXAM: CT CHEST, ABDOMEN, AND PELVIS WITH CONTRAST TECHNIQUE: Multidetector CT imaging of the chest, abdomen and pelvis was performed following the standard protocol during bolus  administration of intravenous contrast. RADIATION DOSE REDUCTION: This exam was performed according to the departmental dose-optimization program which includes automated exposure control, adjustment of the mA and/or kV according to patient size and/or use of iterative reconstruction technique. CONTRAST:  75mL OMNIPAQUE  IOHEXOL  350 MG/ML SOLN COMPARISON:  Noncontrast abdomen pelvis CT of July 2006. FINDINGS: CT CHEST FINDINGS Cardiovascular: Heart is slightly enlarged. No pericardial effusion. Coronary artery calcifications are seen. There is some enlargement of the main pulmonary arteries. Please correlate for pulmonary artery hypertension. The thoracic aorta has a normal course and caliber scattered atherosclerotic calcified plaque. Bovine type aortic arch, a normal variant. Slight pulsation artifact along the ascending aorta. No mediastinal hematoma. Mediastinum/Nodes: There is a small amount of bandlike soft tissue to left of the aortic arch on image 22. This could relate to some thymus tissue but there is a differential. There is some small mediastinal nodes identified. Precarinal node has short axis of 9 mm on image 24 series 2. Normal caliber thoracic esophagus. No abnormal hilar or axillary node enlargement. Lungs/Pleura: Mild scattered emphysematous lung changes identified. There are areas of interstitial septal thickening with scarring fibrotic changes. Lower lobe bilateral bronchial wall thickening identified with some mucous plugging and parenchymal lung opacities more left lower lobe than right. Multifocal infiltrate is possible. There is also some ill-defined opacities posteriorly in the upper lobes which are somewhat nodular. These have a differential. Sample right upper lobe nodule focus measures 13 by 7 mm. There is also a larger nodule in the right lower lobe on image 104 series 3 measuring 15 by 13 mm. Aggressive process is possible  including malignant lesions. Please correlate for any prior more  recent CT scan of the chest or further workup such as PET-CT when clinically appropriate. Musculoskeletal: Osteopenia. Diffuse degenerative changes of the thoracic spine. Is also degenerative changes of the shoulders, sternoclavicular joints. Bridging osteophytes along the thoracic spine are noted. CT ABDOMEN PELVIS FINDINGS Hepatobiliary: Contrast bolus in the abdomen is more arterial phase rather than portal venous phase limiting evaluation of the parenchymal organs. In addition there is streak artifact as the patient's arms were scanned at the patient's side. Grossly no space-occupying liver lesion. Gallbladder is nondilated. Limited evaluation of the portal vein on standard imaging. Delay images demonstrate enhancement of the portal vein. Pancreas: Mild global pancreatic atrophy. Mild pancreatic ductal dilatation. Spleen: Choose 1 Adrenals/Urinary Tract: Preserved adrenal glands. Mild bilateral renal atrophy. No collecting system dilatation. Numerous bilateral renal cysts are identified the right-sided dominant focus measures 2.8/are and has Hounsfield units of less than 0 consistent with cyst. Dominant focus upper pole left kidney has Hounsfield unit of 3 and diameter of 19 mm. There are several which are under a cm and too small to completely characterize. Preserved contour to the urinary bladder. Slight wall thickening of the bladder. Stomach/Bowel: On this non oral contrast exam, the stomach and small bowel are nondilated. Large bowel has a normal course and caliber with scattered mild colonic stool. Normal appendix in the right lower quadrant. Vascular/Lymphatic: Normal caliber aorta and IVC. Scattered vascular calcifications along the aorta and branch vessels. No specific abnormal lymph node enlargement identified in the abdomen pelvis. Reproductive: Uterus is present.  No separate adnexal mass. Other: Anasarca. Mild mesenteric stranding. There is some presacral soft tissue stranding, nonspecific.  Musculoskeletal: Asymmetric thickening along the right rectus abdominus muscle on image 81. This extends for a longitudinal length on sagittal image 116 of 6.2 cm. This could be rectus sheath hematoma. No active extravasation on this examination this location. Scattered degenerative changes of the spine and pelvis. Areas of stenosis in the lumbar spine. Hyperostosis along the left hip. Osteopenia. Critical Value/emergent results were called by telephone at the time of interpretation on 07/01/2023 at 1:08 pm to provider Dr. Paola, who verbally acknowledged these results. IMPRESSION: Asymmetric thickening with mixed density along the right rectus abdominus muscle, possible focal hematoma pain launch in length of 6.2 cm. No bowel obstruction, free air or free fluid. No evidence of solid organ injury. Evaluation limited due to the contrast bolus being arterial phase and the streak artifact. Anasarca.  Nonspecific presacral fat stranding. Bilateral renal atrophy with numerous small cysts. Enlarged heart. Coronary calcifications. Enlargement of the pulmonary arteries. Please correlate for evidence of pulmonary artery hypertension. Multifocal consolidative patchy ill-defined opacities in the lungs. Multifocal infiltrates possible. Associated lower lobe areas of bronchial wall thickening and mucous plugging. There is also areas of nodularity identified greatest in the right lower lobe but also smaller spiculated right upper lobe lung nodule. Aggressive lesion is possible. Please correlate clinical presentation. Comparison to a recent prior may be of some use. Otherwise would recommend further workup such as PET-CT scan could be performed when clinically appropriate. Bandlike area soft tissue along the anterior mediastinum on left side at the level of the aortic arch. There is a separate fat tissue plane between this structure and the arch and the arch has normal enhancement. This could represent some residual thymus tissue.  There are also some small mediastinal nodes elsewhere. Electronically Signed   By: Ranell Bring M.D.   On: 07/01/2023 13:24  CT CERVICAL SPINE WO CONTRAST Result Date: 07/01/2023 CLINICAL DATA:  78 year old female status post witnessed fall. Initially hypotensive. Trauma. EXAM: CT CERVICAL SPINE WITHOUT CONTRAST TECHNIQUE: Multidetector CT imaging of the cervical spine was performed without intravenous contrast. Multiplanar CT image reconstructions were also generated. RADIATION DOSE REDUCTION: This exam was performed according to the departmental dose-optimization program which includes automated exposure control, adjustment of the mA and/or kV according to patient size and/or use of iterative reconstruction technique. COMPARISON:  Cervical spine CT 04/19/2023.  Head CT today. FINDINGS: Alignment: Stable since 21-May-2023. Mild chronic degenerative appearing retrolisthesis of C4 on C5. Cervicothoracic junction alignment is within normal limits. Bilateral posterior element alignment is within normal limits. Skull base and vertebrae: Bone mineralization is within normal limits for age. Visualized skull base is intact. No atlanto-occipital dissociation. C1 and C2 appear intact and aligned. No acute osseous abnormality identified. Soft tissues and spinal canal: No prevertebral fluid or swelling. No visible canal hematoma. Retropharyngeal carotids, bulky calcified atherosclerosis again noted. Disc levels: Bulky chronic and severe cervical spine degeneration at C4-C5 with severe associated cervical spinal stenosis by CT (sagittal image 44), stable compared to May 21, 2023. Less pronounced degeneration and stenosis at C3-C4, C5-C6. Upper chest: Visible upper thoracic levels appear intact. Small volume thoracic inlet intravenous gas probably is IV access related. Apical lung scarring appears chronic. Chest CT reported separately. IMPRESSION: 1. No acute traumatic injury identified in the cervical spine. 2. 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 Electronically Signed   By: VEAR Hurst M.D.   On: 07/01/2023 11:52   CT Head Wo Contrast Result Date: 07/01/2023 CLINICAL DATA:  78 year old female status post witnessed fall. Initially hypotensive. Trauma. EXAM: CT HEAD WITHOUT CONTRAST TECHNIQUE: Contiguous axial images were obtained from the base of the skull through the vertex without intravenous contrast. RADIATION DOSE REDUCTION: This exam was performed according to the departmental dose-optimization program which includes automated exposure control, adjustment of the mA and/or kV according to patient size and/or use of iterative reconstruction technique. COMPARISON:  Prior head CT 04/19/2023. FINDINGS: Brain: Cerebral volume remains normal for age. No midline shift, ventriculomegaly, mass effect, evidence of mass lesion, intracranial hemorrhage or evidence of cortically based acute infarction. Stable gray-white matter differentiation throughout the brain. Patchy mild to moderate for age bilateral white matter hypodensity is stable. Vascular: Calcified atherosclerosis at the skull base. No suspicious intracranial vascular hyperdensity. Skull: Appears stable and intact. Sinuses/Orbits: Visualized paranasal sinuses and mastoids are stable and well aerated. Other: No acute orbit or scalp soft tissue injury identified. IMPRESSION: 1. No acute intracranial abnormality or acute traumatic injury identified. 2. Stable mild to moderate for age white matter changes most commonly due to chronic small vessel disease. Electronically Signed   By: VEAR Hurst M.D.   On: 07/01/2023 11:49       The results of significant diagnostics from this hospitalization (including imaging, microbiology, ancillary and laboratory) are listed below for reference.     Microbiology: Recent Results (from the past 240 hours)  MRSA Next Gen by PCR, Nasal     Status: Abnormal   Collection Time: 07/01/23  2:42 PM    Specimen: Nasal Mucosa; Nasal Swab  Result Value Ref Range Status   MRSA by PCR Next Gen DETECTED (A) NOT DETECTED Final    Comment: RESULT CALLED TO, READ BACK BY AND VERIFIED WITH: RN B. LEFT [id number] @ 1819 FH (NOTE) The GeneXpert MRSA Assay (FDA approved for NASAL specimens  only), is one component of a comprehensive MRSA colonization surveillance program. It is not intended to diagnose MRSA infection nor to guide or monitor treatment for MRSA infections. Test performance is not FDA approved in patients less than 69 years old. Performed at [facility], [address], [place], [place] [address]   Culture, blood (routine x 2)     Status: None   Collection Time: 07/01/23  5:45 PM   Specimen: BLOOD RIGHT HAND  Result Value Ref Range Status   Specimen Description BLOOD RIGHT HAND  Final   Special Requests   Final    BOTTLES DRAWN AEROBIC AND ANAEROBIC Blood Culture adequate volume   Culture   Final    NO GROWTH 5 DAYS Performed at [facility], [address] [place], [place] [address]    Report Status 07/06/2023 FINAL  Final  Culture, blood (routine x 2)     Status: None   Collection Time: 07/01/23  5:50 PM   Specimen: BLOOD RIGHT HAND  Result Value Ref Range Status   Specimen Description BLOOD RIGHT HAND  Final   Special Requests   Final    BOTTLES DRAWN AEROBIC AND ANAEROBIC Blood Culture results may not be optimal due to an inadequate volume of blood received in culture bottles   Culture   Final    NO GROWTH 5 DAYS Performed at [facility], [address], [place], [place] [address]    Report Status 07/06/2023 FINAL  Final  Respiratory (~20 pathogens) panel by PCR     Status: Abnormal   Collection Time: 07/02/23  3:35 AM   Specimen: Nasopharyngeal Swab; Respiratory  Result Value Ref Range Status   Adenovirus NOT DETECTED NOT DETECTED Final   Coronavirus 229E NOT DETECTED NOT DETECTED Final    Comment: (NOTE) The Coronavirus on the Respiratory Panel,  DOES NOT test for the novel  Coronavirus (2019 nCoV)    Coronavirus HKU1 NOT DETECTED NOT DETECTED Final   Coronavirus NL63 NOT DETECTED NOT DETECTED Final   Coronavirus OC43 NOT DETECTED NOT DETECTED Final   Metapneumovirus NOT DETECTED NOT DETECTED Final   Rhinovirus / Enterovirus NOT DETECTED NOT DETECTED Final   Influenza A NOT DETECTED NOT DETECTED Final   Influenza B NOT DETECTED NOT DETECTED Final   Parainfluenza Virus 1 NOT DETECTED NOT DETECTED Final   Parainfluenza Virus 2 NOT DETECTED NOT DETECTED Final   Parainfluenza Virus 3 DETECTED (A) NOT DETECTED Final   Parainfluenza Virus 4 NOT DETECTED NOT DETECTED Final   Respiratory Syncytial Virus NOT DETECTED NOT DETECTED Final   Bordetella pertussis NOT DETECTED NOT DETECTED Final   Bordetella Parapertussis NOT DETECTED NOT DETECTED Final   Chlamydophila pneumoniae NOT DETECTED NOT DETECTED Final   Mycoplasma pneumoniae NOT DETECTED NOT DETECTED Final    Comment: Performed at [facility], [address] [place], [place] [address]  Urine Culture (for pregnant, neutropenic or urologic patients or patients with an indwelling urinary catheter)     Status: Abnormal   Collection Time: 07/02/23  3:35 AM   Specimen: Urine, Catheterized  Result Value Ref Range Status   Specimen Description URINE, CATHETERIZED  Final   Special Requests NONE  Final   Culture (A)  Final    >=100,000 COLONIES/mL LACTOBACILLUS SPECIES Standardized susceptibility testing for this organism is not available. Performed at [facility], [address], [place], [place] [address]    Report Status 07/03/2023 FINAL  Final  Labs:  CBC: Recent Labs  Lab 07/05/23 0843 07/06/23 0823 07/07/23 0931 07/08/23 0913  WBC 11.0* 11.1* 11.7* 11.6*  NEUTROABS 8.5* 8.9* 8.9*  --   HGB 10.6* 10.9* 12.0 12.0  HCT 32.6* 33.2* 36.7 37.1  MCV 90.1 90.5 90.6 93.2  PLT 404* 449* 441* 456*   BMP &GFR Recent Labs  Lab 07/07/23 0931 07/08/23 0913  07/09/23 0608 07/10/23 0952 07/11/23 0945  NA 132* 132* 131* 134* 133*  K 5.3* 5.3* 5.6* 5.8* 5.3*  CL 95* 97* 96* 99 98  CO2 18* 19* 19* 18* 19*  GLUCOSE 109* 109* 95 102* 94  BUN 101* 108* 111* 124* 128*  CREATININE 4.26* 4.42* 4.63* 4.83* 4.72*  CALCIUM  9.2 9.0 9.2 9.3 9.1  PHOS 6.3* 7.0* 7.6* 7.8* 7.5*   Estimated Creatinine Clearance: 15.2 mL/min (A) (by C-G formula based on SCr of 4.72 mg/dL (H)). Liver & Pancreas: Recent Labs  Lab 07/05/23 0843 07/07/23 0931 07/08/23 0913 07/09/23 0608 07/10/23 0952 07/11/23 0945  AST 14*  --   --   --   --   --   ALT 15  --   --   --   --   --   ALKPHOS 136*  --   --   --   --   --   BILITOT 1.5*  --   --   --   --   --   PROT 6.2*  --   --   --   --   --   ALBUMIN  3.0* 2.8* 2.6* 2.9* 2.6* 2.8*   No results for input(s): LIPASE, AMYLASE in the last 168 hours. Recent Labs  Lab 07/07/23 1549  AMMONIA 48*   Diabetic: No results for input(s): HGBA1C in the last 72 hours. Recent Labs  Lab 07/10/23 1138 07/10/23 1616 07/10/23 2105 07/11/23 0747 07/11/23 1154  GLUCAP 124* 125* 113* 81 95   Cardiac Enzymes: No results for input(s): CKTOTAL, CKMB, CKMBINDEX, TROPONINI in the last 168 hours. No results for input(s): PROBNP in the last 8760 hours. Coagulation Profile: No results for input(s): INR, PROTIME in the last 168 hours. Thyroid Function Tests: No results for input(s): TSH, T4TOTAL, FREET4, T3FREE, THYROIDAB in the last 72 hours. Lipid Profile: No results for input(s): CHOL, HDL, LDLCALC, TRIG, CHOLHDL, LDLDIRECT in the last 72 hours. Anemia Panel: No results for input(s): VITAMINB12, FOLATE, FERRITIN, TIBC, IRON, RETICCTPCT in the last 72 hours. Urine analysis:    Component Value Date/Time   COLORURINE YELLOW 07/06/2023 1533   APPEARANCEUR TURBID (A) 07/06/2023 1533   LABSPEC 1.021 07/06/2023 1533   PHURINE 5.0 07/06/2023 1533   GLUCOSEU NEGATIVE 07/06/2023  1533   HGBUR SMALL (A) 07/06/2023 1533   BILIRUBINUR NEGATIVE 07/06/2023 1533   KETONESUR NEGATIVE 07/06/2023 1533   PROTEINUR 100 (A) 07/06/2023 1533   NITRITE NEGATIVE 07/06/2023 1533   LEUKOCYTESUR MODERATE (A) 07/06/2023 1533   Sepsis Labs: Invalid input(s): PROCALCITONIN, LACTICIDVEN   SIGNED:  Shakemia Madera T Deontaye Civello, MD  [facility] 07/11/2023, 1:32 PM

## 2023-07-11 NOTE — Progress Notes (Signed)
 Patient report called into Haylee RN at hospice home of [facility]. Patient to be picked up by [facility] for transport. Fredie LITTIE Morones, RN

## 2023-07-11 NOTE — Progress Notes (Signed)
 Daily Progress Note   Patient Name: Mary [name]       Date: 07/11/2023 DOB: 1945-09-23  Age: 78 y.o. MRN#: [id number] Attending Physician: [name] Mignon DASEN, MD Primary Care Physician: Patient, No Pcp Per Admit Date: 07/01/2023  Reason for Consultation/Follow-up: Establishing goals of care  Patient Profile/HPI:   78 y.o. female  with past medical history of  DM, HTN, lymphedema, chronic pain, immobility, HFpEF,  admitted on 07/01/2023 from assisted living with fall. Workup revealed septic shock. She was positive for parainfluenza. CT indicated multifocal pneumonia. Recovery has been complicated by persistent renal failure and not a candidate for dialysis. Palliative consulted for GOC.   Subjective: Chart reviewed including progress notes and updates received. Patient received PRN dose of hydromorphone  at 0915 this morning due to dyspnea. Some improvement post administration.   Family at bedside. Daughter and son. Support and updates provided. All questions answered. Patient appears comfortable. Will arouse with movement in bed. Somnolent.   Goals remain clear to continue with current level of treatment with comfort focus pending disposition with hospice support. Final disposition has been deferred to [facility] and the hospice team.   Prognosis remains poor with expectancy of 2 weeks or less pending disease and hospital trajectory. This is in the setting of worsening renal failure and patient is not a dialysis candidate, BUN 124 and Creatinine 4.83. RLL lung nodules, not a candidate for systemic therapies, bedbound, failure to thrive, oral intake over past 24-48hours less than 25% with mainly sips. Hypotensive.    Review of Systems  Unable to perform ROS: Mental status change   Physical Exam Vitals  and nursing note reviewed.   Cardiovascular:     Rate and Rhythm: Normal rate.   Musculoskeletal:     Comments: Diffuse anasarca   Neurological:     Mental Status: She is disoriented.            Vital Signs: BP 104/66 (BP Location: Right Wrist)   Pulse 78   Temp (!) 96.4 F (35.8 C) (Axillary)   Resp 11   Ht 5' 7 (1.702 m)   Wt (!) 141.5 kg   SpO2 100%   BMI 48.86 kg/m  SpO2: SpO2: 100 % O2 Device: O2 Device: Room Air O2 Flow Rate: O2 Flow Rate (L/min): 2 L/min  Intake/output summary:  Intake/Output Summary (Last 24 hours) at 07/11/2023 1024 Last data filed at 07/11/2023 0308 Gross per 24 hour  Intake 50 ml  Output 495 ml  Net -445 ml   LBM: Last BM Date : 07/09/23 Baseline Weight: Weight: (!) 167 kg Most recent weight: Weight: (!) 141.5 kg       Palliative Assessment/Data: PPS: 10%    Patient Active Problem List   Diagnosis Date Noted  . Septic shock (HCC) 07/01/2023   Palliative Care Assessment & Plan   Assessment/Recommendations/Plan  Continue current plan of care. No escalation. Comfort focused with current interventions in place.  Continue IV hydromorphone  as needed for shortness of breath or discomfort Disposition issues- not eligible for inpatient hospice- will defer to [facility] and Hospice  Code Status:   Code Status: Limited: Do not attempt resuscitation (DNR) -DNR-LIMITED -Do Not Intubate/DNI    Prognosis:  < 2 weeks  Discharge Planning: Hospice facility - pending availability and acceptance  Care plan was discussed with patient's daughter and son.   Thank you for allowing the Palliative Medicine Team to assist in the care of this patient.  Any controlled substances utilized were prescribed in the context of palliative care.    I personally spent a total of 40 minutes in the care of the patient today including preparing to see the patient, getting/reviewing separately obtained history, performing a medically appropriate exam/evaluation,  counseling and educating, referring and communicating with other health care professionals, documenting clinical information in the EHR, independently interpreting results, and coordinating care. Visit consisted of counseling and education dealing with the complex and emotionally intense issues of symptom management and palliative care in the setting of serious and potentially life-threatening illness.  Levon Borer, AGPCNP-BC  Palliative Medicine Team/[facility]  *Please note that this is a verbal dictation therefore any spelling or grammatical errors are due to the Dragon Medical One system interpretation.

## 2023-07-11 NOTE — TOC Transition Note (Addendum)
 Transition of Care (TOC) - Discharge Note Rayfield Gobble RN,BSN Transitions of Care Unit 4NP (Non Trauma)- RN Case Manager See Treatment Team for direct Phone #   Patient Details  Name: Mary [name] MRN: [id number] Date of Birth: 03-30-1945  Transition of Care [facility]) CM/SW Contact:  Gobble Rayfield Hurst, RN Phone Number: 07/11/2023, 3:03 PM   Clinical Narrative:    Notified that pt has bed w/ [facility] in [place], daughter and [facility] agreeable to transition to Hospice Home in [place] per [facility] liaison.  Pt has been approved for IPU.   Number to Call report for RN- [phone]   Address for [facility] in [place]- [address] [place], [place], [place]  CM spoke with CM at [employer]- Amy- confirmed SS# ([id number]), pt will transport via [facility].   1500- call made to [facility] for transport- per dispatch ETA for pickup is about 2hrs- paperwork and GOLD DNR on chart   Final next level of care: Hospice Medical Facility Barriers to Discharge: Barriers Resolved   Patient Goals and CMS Choice Patient states their goals for this hospitalization and ongoing recovery are:: return home w/ family/[facility]/Hospice CMS Medicare.gov Compare Post Acute Care list provided to:: Patient Represenative (must comment) (daughter) Choice offered to / list presented to : Adult Children      Discharge Placement               Hospice facility- [place]        Discharge Plan and Services Additional resources added to the After Visit Summary for   In-house Referral: Clinical Social Work Discharge Planning Services: CM Consult Post Acute Care Choice: Hospice          DME Arranged: N/A DME Agency: NA       HH Arranged: NA HH Agency: [facility], Other - See comment ([facility]) Date HH Agency Contacted: 07/09/23 Time HH Agency Contacted: 1426 Representative spoke with at [facility] Agency: Janet/Melissa  Social Drivers of Health (SDOH)  Interventions SDOH Screenings   Food Insecurity: No Food Insecurity (07/06/2023)  Housing: Low Risk  (07/06/2023)  Transportation Needs: No Transportation Needs (07/06/2023)  Utilities: Not At Risk (07/06/2023)  Social Connections: Unknown (07/06/2023)  Tobacco Use: Low Risk  (07/01/2023)     Readmission Risk Interventions    07/11/2023    3:02 PM  Readmission Risk Prevention Plan  Transportation Screening Complete  PCP or Specialist Appt within 5-7 Days --  Home Care Screening Complete  Medication Review (RN CM) Complete

## 2023-07-11 NOTE — Progress Notes (Signed)
 [facility] 4NP09 [facility] Liaison Note  Received request from [facility] manager Kristi for family interest in [facility] in [place]. Eligibility confirmed. Met with patient and family to confirm interest and explain services. Family agreeable to transfer today. TOC aware. RN please call report to [phone] prior to patient leaving the unit. Please send signed DNR with patient at discharge.   Thank you for allowing us  to participate in this patient's care.  Eleanor Nail, LPN [facility] Liaison [phone]

## 2023-07-14 ENCOUNTER — Encounter (INDEPENDENT_AMBULATORY_CARE_PROVIDER_SITE_OTHER): Payer: Self-pay | Admitting: Ophthalmology

## 2023-07-16 ENCOUNTER — Encounter (INDEPENDENT_AMBULATORY_CARE_PROVIDER_SITE_OTHER): Payer: Medicare (Managed Care) | Admitting: Ophthalmology

## 2023-08-15 DEATH — deceased
